# Patient Record
Sex: Male | Born: 1945
Health system: Southern US, Community
[De-identification: ages and names within clinical notes are randomized; demographics above are authoritative.]

## PROBLEM LIST (undated history)

## (undated) DIAGNOSIS — J309 Allergic rhinitis, unspecified: Secondary | ICD-10-CM

## (undated) DIAGNOSIS — Z860100 Personal history of colon polyps, unspecified: Secondary | ICD-10-CM

## (undated) DIAGNOSIS — B360 Pityriasis versicolor: Secondary | ICD-10-CM

## (undated) DIAGNOSIS — Z9849 Cataract extraction status, unspecified eye: Secondary | ICD-10-CM

## (undated) DIAGNOSIS — C61 Malignant neoplasm of prostate: Secondary | ICD-10-CM

## (undated) DIAGNOSIS — Z974 Presence of external hearing-aid: Secondary | ICD-10-CM

## (undated) DIAGNOSIS — Z9989 Dependence on other enabling machines and devices: Secondary | ICD-10-CM

## (undated) DIAGNOSIS — E785 Hyperlipidemia, unspecified: Secondary | ICD-10-CM

## (undated) DIAGNOSIS — Z7901 Long term (current) use of anticoagulants: Secondary | ICD-10-CM

## (undated) DIAGNOSIS — E119 Type 2 diabetes mellitus without complications: Secondary | ICD-10-CM

## (undated) DIAGNOSIS — N401 Enlarged prostate with lower urinary tract symptoms: Secondary | ICD-10-CM

## (undated) DIAGNOSIS — J302 Other seasonal allergic rhinitis: Secondary | ICD-10-CM

## (undated) DIAGNOSIS — I4819 Other persistent atrial fibrillation: Secondary | ICD-10-CM

## (undated) DIAGNOSIS — Z8739 Personal history of other diseases of the musculoskeletal system and connective tissue: Secondary | ICD-10-CM

## (undated) DIAGNOSIS — R0600 Dyspnea, unspecified: Secondary | ICD-10-CM

## (undated) DIAGNOSIS — G4733 Obstructive sleep apnea (adult) (pediatric): Secondary | ICD-10-CM

## (undated) DIAGNOSIS — I4891 Unspecified atrial fibrillation: Secondary | ICD-10-CM

## (undated) DIAGNOSIS — I429 Cardiomyopathy, unspecified: Secondary | ICD-10-CM

## (undated) DIAGNOSIS — J439 Emphysema, unspecified: Secondary | ICD-10-CM

## (undated) DIAGNOSIS — Z8619 Personal history of other infectious and parasitic diseases: Secondary | ICD-10-CM

## (undated) DIAGNOSIS — I10 Essential (primary) hypertension: Secondary | ICD-10-CM

## (undated) DIAGNOSIS — I499 Cardiac arrhythmia, unspecified: Secondary | ICD-10-CM

## (undated) DIAGNOSIS — I5042 Chronic combined systolic (congestive) and diastolic (congestive) heart failure: Secondary | ICD-10-CM

## (undated) DIAGNOSIS — J84115 Respiratory bronchiolitis interstitial lung disease: Secondary | ICD-10-CM

## (undated) DIAGNOSIS — K5730 Diverticulosis of large intestine without perforation or abscess without bleeding: Secondary | ICD-10-CM

## (undated) DIAGNOSIS — I519 Heart disease, unspecified: Secondary | ICD-10-CM

## (undated) DIAGNOSIS — I43 Cardiomyopathy in diseases classified elsewhere: Secondary | ICD-10-CM

## (undated) HISTORY — DX: Malignant neoplasm of prostate: C61

## (undated) HISTORY — PX: COLONOSCOPY: SHX174

## (undated) HISTORY — DX: Pityriasis versicolor: B36.0

## (undated) HISTORY — PX: TRANSTHORACIC ECHOCARDIOGRAM: SHX275

## (undated) HISTORY — PX: TONSILLECTOMY: SUR1361

## (undated) HISTORY — PX: CATARACT EXTRACTION W/ INTRAOCULAR LENS  IMPLANT, BILATERAL: SHX1307

## (undated) HISTORY — DX: Unspecified atrial fibrillation: I48.91

## (undated) HISTORY — DX: Essential (primary) hypertension: I10

## (undated) HISTORY — DX: Cataract extraction status, unspecified eye: Z98.49

## (undated) HISTORY — PX: LEFT ATRIAL APPENDAGE OCCLUSION: SHX173A

## (undated) HISTORY — DX: Hyperlipidemia, unspecified: E78.5

---
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
## 1965-04-09 HISTORY — PX: APPENDECTOMY: SHX54

## 2003-04-10 HISTORY — PX: BACK SURGERY: SHX140

## 2003-12-23 ENCOUNTER — Ambulatory Visit (HOSPITAL_COMMUNITY): Admission: RE | Admit: 2003-12-23 | Discharge: 2003-12-24 | Payer: Self-pay | Admitting: Neurosurgery

## 2003-12-23 HISTORY — PX: LAMINECTOMY AND MICRODISCECTOMY LUMBAR SPINE: SHX1913

## 2004-01-14 ENCOUNTER — Emergency Department (HOSPITAL_COMMUNITY): Admission: EM | Admit: 2004-01-14 | Discharge: 2004-01-14 | Payer: Self-pay | Admitting: Emergency Medicine

## 2004-02-10 ENCOUNTER — Encounter: Admission: RE | Admit: 2004-02-10 | Discharge: 2004-03-20 | Payer: Self-pay | Admitting: Neurosurgery

## 2004-07-14 ENCOUNTER — Ambulatory Visit: Payer: Self-pay | Admitting: Pulmonary Disease

## 2004-08-03 ENCOUNTER — Encounter: Payer: Self-pay | Admitting: Pulmonary Disease

## 2004-08-03 ENCOUNTER — Ambulatory Visit (HOSPITAL_BASED_OUTPATIENT_CLINIC_OR_DEPARTMENT_OTHER): Admission: RE | Admit: 2004-08-03 | Discharge: 2004-08-03 | Payer: Self-pay | Admitting: Pulmonary Disease

## 2004-08-11 ENCOUNTER — Ambulatory Visit: Payer: Self-pay | Admitting: Pulmonary Disease

## 2004-08-25 ENCOUNTER — Ambulatory Visit: Payer: Self-pay | Admitting: Pulmonary Disease

## 2004-09-22 ENCOUNTER — Ambulatory Visit: Payer: Self-pay | Admitting: Pulmonary Disease

## 2005-10-15 ENCOUNTER — Ambulatory Visit: Payer: Self-pay | Admitting: Gastroenterology

## 2005-10-22 ENCOUNTER — Ambulatory Visit: Payer: Self-pay | Admitting: Pulmonary Disease

## 2005-10-26 ENCOUNTER — Ambulatory Visit: Payer: Self-pay | Admitting: Gastroenterology

## 2005-10-26 ENCOUNTER — Encounter: Payer: Self-pay | Admitting: Gastroenterology

## 2007-05-20 ENCOUNTER — Encounter: Admission: RE | Admit: 2007-05-20 | Discharge: 2007-05-20 | Payer: Self-pay | Admitting: Family Medicine

## 2008-05-10 ENCOUNTER — Encounter: Payer: Self-pay | Admitting: Family Medicine

## 2008-07-01 DIAGNOSIS — J309 Allergic rhinitis, unspecified: Secondary | ICD-10-CM | POA: Insufficient documentation

## 2008-07-01 DIAGNOSIS — I10 Essential (primary) hypertension: Secondary | ICD-10-CM | POA: Insufficient documentation

## 2008-07-01 DIAGNOSIS — E785 Hyperlipidemia, unspecified: Secondary | ICD-10-CM | POA: Insufficient documentation

## 2008-07-02 ENCOUNTER — Ambulatory Visit: Payer: Self-pay | Admitting: Pulmonary Disease

## 2008-07-02 DIAGNOSIS — G4733 Obstructive sleep apnea (adult) (pediatric): Secondary | ICD-10-CM | POA: Insufficient documentation

## 2008-10-20 ENCOUNTER — Ambulatory Visit: Payer: Self-pay | Admitting: Family Medicine

## 2009-06-23 ENCOUNTER — Telehealth: Payer: Self-pay | Admitting: Family Medicine

## 2009-07-01 ENCOUNTER — Ambulatory Visit: Payer: Self-pay | Admitting: Pulmonary Disease

## 2009-07-19 ENCOUNTER — Telehealth: Payer: Self-pay | Admitting: Family Medicine

## 2009-07-19 ENCOUNTER — Ambulatory Visit: Payer: Self-pay | Admitting: Family Medicine

## 2009-07-19 DIAGNOSIS — B360 Pityriasis versicolor: Secondary | ICD-10-CM | POA: Insufficient documentation

## 2009-07-19 DIAGNOSIS — Z87891 Personal history of nicotine dependence: Secondary | ICD-10-CM | POA: Insufficient documentation

## 2009-07-20 LAB — CONVERTED CEMR LAB
Alkaline Phosphatase: 67 units/L (ref 39–117)
CO2: 32 meq/L (ref 19–32)
Cholesterol: 138 mg/dL (ref 0–200)
Creatinine, Ser: 1 mg/dL (ref 0.4–1.5)
Direct LDL: 49.9 mg/dL
Glucose, Bld: 132 mg/dL — ABNORMAL HIGH (ref 70–99)
Total CHOL/HDL Ratio: 3
Total Protein: 6.6 g/dL (ref 6.0–8.3)
Triglycerides: 366 mg/dL — ABNORMAL HIGH (ref 0.0–149.0)

## 2009-08-25 ENCOUNTER — Telehealth: Payer: Self-pay | Admitting: Pulmonary Disease

## 2009-08-26 ENCOUNTER — Encounter: Payer: Self-pay | Admitting: Pulmonary Disease

## 2009-10-07 ENCOUNTER — Ambulatory Visit: Payer: Self-pay | Admitting: Family Medicine

## 2009-10-07 DIAGNOSIS — M109 Gout, unspecified: Secondary | ICD-10-CM | POA: Insufficient documentation

## 2009-10-21 ENCOUNTER — Ambulatory Visit: Payer: Self-pay | Admitting: Family Medicine

## 2010-03-07 ENCOUNTER — Ambulatory Visit: Payer: Self-pay | Admitting: Family Medicine

## 2010-04-13 ENCOUNTER — Telehealth: Payer: Self-pay | Admitting: Family Medicine

## 2010-04-14 ENCOUNTER — Telehealth: Payer: Self-pay | Admitting: Family Medicine

## 2010-04-17 ENCOUNTER — Telehealth: Payer: Self-pay | Admitting: Family Medicine

## 2010-05-08 ENCOUNTER — Ambulatory Visit
Admission: RE | Admit: 2010-05-08 | Discharge: 2010-05-08 | Payer: Self-pay | Source: Home / Self Care | Attending: Family Medicine | Admitting: Family Medicine

## 2010-05-10 NOTE — Assessment & Plan Note (Signed)
Summary: fu on meds/pt will come in fasting/njr/pt rsc/cjr   Vital Signs:  Patient profile:   65 year old male Weight:      223 pounds Temp:     98.8 degrees F oral BP sitting:   142 / 90  (left arm) Cuff size:   large  Vitals Entered By: Sid Falcon LPN (July 19, 2009 8:49 AM)  Serial Vital Signs/Assessments:  Time      Position  BP       Pulse  Resp  Temp     By                     045/40                         Evelena Peat MD  CC: follow-up on meds, refills needed, pt fasting, Lipid Management, Hypertension Management   History of Present Illness: Patient here for followup multiple items as follows.  Hyperlipidemia. No history of CAD. Compliant with Lipitor. No side effects.  Hypertension treated with enalapril hydrochlorothiazide. No recent dizziness, palpitations or other side effects.  Ongoing nicotine use. Has scaled back. No chronic cough, progressive dyspnea, hemoptysis, or any weight changes.  Obstructive sleep apnea with recent assessment by pulmonologist and titration of CPAP.  History slightly pruritic rash mostly involving trunk previously treated with ketoconazole with good results. Would like to consider retreatment now with recent recurrence  Hypertension History:      He denies headache, chest pain, palpitations, dyspnea with exertion, orthopnea, PND, peripheral edema, visual symptoms, neurologic problems, syncope, and side effects from treatment.  He notes no problems with any antihypertensive medication side effects.        Positive major cardiovascular risk factors include male age 9 years old or older, hyperlipidemia, hypertension, and current tobacco user.  Negative major cardiovascular risk factors include no history of diabetes and negative family history for ischemic heart disease.        Further assessment for target organ damage reveals no history of ASHD, stroke/TIA, or peripheral vascular disease.    Lipid Management History:      Positive  NCEP/ATP III risk factors include male age 13 years old or older, current tobacco user, and hypertension.  Negative NCEP/ATP III risk factors include non-diabetic, no family history for ischemic heart disease, no ASHD (atherosclerotic heart disease), no prior stroke/TIA, no peripheral vascular disease, and no history of aortic aneurysm.      Allergies (verified): No Known Drug Allergies  Past History:  Past Surgical History: Last updated: 10/20/2008 s/p tonsillectomy. s/p spine surgery 2007  Past Medical History: Current Problems:  HYPERTENSION (ICD-401.9) HYPERLIPIDEMIA (ICD-272.4) ALLERGIC RHINITIS (ICD-477.9) RECURRENT TINEA VERSICOLOR OSA   PMH-FH-SH reviewed for relevance  Review of Systems  The patient denies anorexia, fever, weight loss, chest pain, syncope, dyspnea on exertion, peripheral edema, prolonged cough, headaches, hemoptysis, abdominal pain, melena, hematochezia, and severe indigestion/heartburn.    Physical Exam  General:  Well-developed,well-nourished,in no acute distress; alert,appropriate and cooperative throughout examination Head:  Normocephalic and atraumatic without obvious abnormalities. No apparent alopecia or balding. Eyes:  pupils equal, pupils round, and pupils reactive to light.   Ears:  External ear exam shows no significant lesions or deformities.  Otoscopic examination reveals clear canals, tympanic membranes are intact bilaterally without bulging, retraction, inflammation or discharge. Hearing is grossly normal bilaterally. Mouth:  Oral mucosa and oropharynx without lesions or exudates.  Teeth in good repair. Neck:  No  deformities, masses, or tenderness noted. Lungs:  Normal respiratory effort, chest expands symmetrically. Lungs are clear to auscultation, no crackles or wheezes. Heart:  normal rate, regular rhythm, and no murmur.   Extremities:  no edema.   Impression & Recommendations:  Problem # 1:  HYPERTENSION (ICD-401.9)  His  updated medication list for this problem includes:    Enalapril-hydrochlorothiazide 10-25 Mg Tabs (Enalapril-hydrochlorothiazide) .Marland Kitchen... Take 1 tablet by mouth once a day  Orders: Venipuncture (62130) TLB-BMP (Basic Metabolic Panel-BMET) (80048-METABOL)  Problem # 2:  HYPERLIPIDEMIA (ICD-272.4)  His updated medication list for this problem includes:    Lipitor 10 Mg Tabs (Atorvastatin calcium) .Marland Kitchen... Take 1 tablet by mouth once a day  Orders: Venipuncture (86578) TLB-Lipid Panel (80061-LIPID) TLB-Hepatic/Liver Function Pnl (80076-HEPATIC)  Problem # 3:  TINEA VERSICOLOR (ICD-111.0) Assessment: New  His updated medication list for this problem includes:    Ketoconazole 200 Mg Tabs (Ketoconazole) .Marland Kitchen... 2 tablets by mouth times one dose as needed tinea versicolor  Orders: Venipuncture (46962)  Problem # 4:  PERS HX TOBACCO USE PRESENTING HAZARDS HEALTH (ICD-V15.82) discussed cessation but pt motivation is low.  Complete Medication List: 1)  Lipitor 10 Mg Tabs (Atorvastatin calcium) .... Take 1 tablet by mouth once a day 2)  Enalapril-hydrochlorothiazide 10-25 Mg Tabs (Enalapril-hydrochlorothiazide) .... Take 1 tablet by mouth once a day 3)  Allegra 180 Mg Tabs (Fexofenadine hcl) .... Take 1 tablet by mouth once a day 4)  Aspirin Low Dose 81 Mg Tabs (Aspirin) .... Take 1 tablet by mouth once a day 5)  Flonase 50 Mcg/act Susp (Fluticasone propionate) .... Use as needed 6)  Fish Oil  .... Take by mouth daily 7)  Ketoconazole 200 Mg Tabs (Ketoconazole) .... 2 tablets by mouth times one dose as needed tinea versicolor  Hypertension Assessment/Plan:      The patient's hypertensive risk group is category B: At least one risk factor (excluding diabetes) with no target organ damage.  Today's blood pressure is 142/90.    Lipid Assessment/Plan:      Based on NCEP/ATP III, the patient's risk factor category is "2 or more risk factors and a calculated 10 year CAD risk of > 20%".  The  patient's lipid goals are as follows: Total cholesterol goal is 200; LDL cholesterol goal is 130; HDL cholesterol goal is 40; Triglyceride goal is 150.    Patient Instructions: 1)  Consider a complete physical examination at some point later this year Prescriptions: KETOCONAZOLE 200 MG TABS (KETOCONAZOLE) 2 tablets by mouth times one dose as needed tinea versicolor  #8 x 0   Entered and Authorized by:   Evelena Peat MD   Signed by:   Evelena Peat MD on 07/19/2009   Method used:   [facility]  [address] [phone][facility]       [address] [place], [place]  [address]       Ph: [phone] or [phone]       Fax: [phone]   RxID:   [id number]   Preventive Care Screening  Colonoscopy:    Date:  10/07/2005    Results:  normal

## 2010-05-10 NOTE — Assessment & Plan Note (Signed)
Summary: chills, mylagias/dm   Vital Signs:  Patient profile:   65 year old male Weight:      216 pounds Temp:     98.1 degrees F oral BP sitting:   140 / 100  (left arm) Cuff size:   large  Vitals Entered By: Sid Falcon LPN (March 07, 2010 9:14 AM)  History of Present Illness: Patient seen with 8 day history of bronchial symptoms. Cough productive of brown to green sputum. No dyspnea. No fevers. Diffuse myalgias. Some nasal congestion. Long-term smoker. No hemoptysis. No relief with over-the-counter medications.  Allergies (verified): No Known Drug Allergies  Past History:  Past Medical History: Last updated: 10/07/2009 Current Problems:  HYPERTENSION (ICD-401.9) HYPERLIPIDEMIA (ICD-272.4) ALLERGIC RHINITIS (ICD-477.9) RECURRENT TINEA VERSICOLOR OSA GOUT    Physical Exam  General:  Well-developed,well-nourished,in no acute distress; alert,appropriate and cooperative throughout examination Ears:  External ear exam shows no significant lesions or deformities.  Otoscopic examination reveals clear canals, tympanic membranes are intact bilaterally without bulging, retraction, inflammation or discharge. Hearing is grossly normal bilaterally. Mouth:  Oral mucosa and oropharynx without lesions or exudates.  Teeth in good repair. Neck:  No deformities, masses, or tenderness noted. Lungs:  Normal respiratory effort, chest expands symmetrically. Lungs are clear to auscultation, no crackles or wheezes. Heart:  normal rate and regular rhythm.     Impression & Recommendations:  Problem # 1:  ACUTE BRONCHITIS (ICD-466.0)  His updated medication list for this problem includes:    Azithromycin 250 Mg Tabs (Azithromycin) .Marland Kitchen... 2 by mouth today then one by mouth once daily for 4 days  Complete Medication List: 1)  Lipitor 10 Mg Tabs (Atorvastatin calcium) .... Take 1 tablet by mouth once a day 2)  Enalapril-hydrochlorothiazide 10-25 Mg Tabs (Enalapril-hydrochlorothiazide) ....  Take 1 tablet by mouth once a day 3)  Allegra 180 Mg Tabs (Fexofenadine hcl) .... Take 1 tablet by mouth once a day 4)  Aspirin Low Dose 81 Mg Tabs (Aspirin) .... Take 1 tablet by mouth once a day 5)  Flonase 50 Mcg/act Susp (Fluticasone propionate) .... Use as needed 6)  Fish Oil  .... Take by mouth daily 7)  Ketoconazole 200 Mg Tabs (Ketoconazole) .... 2 tablets by mouth times one dose as needed tinea versicolor 8)  Indomethacin 50 Mg Caps (Indomethacin) .... One by mouth q 8 hours as needed gout pain 9)  Azithromycin 250 Mg Tabs (Azithromycin) .... 2 by mouth today then one by mouth once daily for 4 days  Patient Instructions: 1)  Acute Bronchitis symptoms for less then 10 days are not  helped by antibiotics. Take over the counter cough medications. Call if no improvement in 5-7 days, sooner if increasing cough, fever, or new symptoms ( shortness of breath, chest pain) .  Prescriptions: AZITHROMYCIN 250 MG TABS (AZITHROMYCIN) 2 by mouth today then one by mouth once daily for 4 days  #6 x 0   Entered and Authorized by:   Evelena Peat MD   Signed by:   Evelena Peat MD on 03/07/2010   Method used:   [facility]  [address] [phone][facility]ail)       [address] [place], [place]  [address]       Ph: [phone] or [phone]       Fax: [phone]   RxID:   [id number]    Orders Added: 1)  Est. Patient Level III [id number]

## 2010-05-10 NOTE — Progress Notes (Signed)
Summary: results of cpap download  Phone Note Call from Patient Call back at [phone]   Caller: Patient Call For: Ronald [name] Summary of Call: pt calling to talk to nurse about sleep results Initial call taken by: Rickard Patience,  Aug 25, 2009 9:51 AM  Follow-up for Phone Call        Pt says he did a 2 week CPAP download through [facility] and has not received the results. Please advise if this information has been received.Michel Bickers CMA  Aug 25, 2009 10:23 AM  Additional Follow-up for Phone Call Additional follow up Details #1::        would be happy to review when results available. Additional Follow-up by: Barbaraann Share MD,  Aug 25, 2009 4:18 PM    Additional Follow-up for Phone Call Additional follow up Details #2::    Aundra Millet have you seen these results? Carron Curie CMA  Aug 25, 2009 4:32 PM   received results and put in [facility] very important look at folder.  Arman Filter LPN  Aug 25, 2009 4:37 PM   Additional Follow-up for Phone Call Additional follow up Details #3:: Details for Additional Follow-up Action Taken: reviewed. Additional Follow-up by: Barbaraann Share MD,  Aug 26, 2009 10:42 AM

## 2010-05-10 NOTE — Assessment & Plan Note (Signed)
Summary: gout/dm   Vital Signs:  Patient profile:   65 year old male Weight:      213 pounds Temp:     98.7 degrees F oral BP sitting:   130 / 90  (left arm) Cuff size:   large CC: Gout in right foot since monday, gout painsarted  in left foot last thursday, swelling & redness Cyndia Skeeters   History of Present Illness: Patient seen with right foot pain.  Past history of gout. He has had about 3 or 4 flareups this year and probably 5 episodes past 2 years. Took a friend's indomethacin and this seemed to help. He does take hydrochlorothiazide.  Previous gout flareups involving the upper extremity. No recent foot injury. He is aware of foods that may precipitate.  Moderate ETOH use and has scaled back some.  Current Medications (verified): 1)  Lipitor 10 Mg Tabs (Atorvastatin Calcium) .... Take 1 Tablet By Mouth Once A Day 2)  Enalapril-Hydrochlorothiazide 10-25 Mg Tabs (Enalapril-Hydrochlorothiazide) .... Take 1 Tablet By Mouth Once A Day 3)  Allegra 180 Mg Tabs (Fexofenadine Hcl) .... Take 1 Tablet By Mouth Once A Day 4)  Aspirin Low Dose 81 Mg Tabs (Aspirin) .... Take 1 Tablet By Mouth Once A Day 5)  Flonase 50 Mcg/act Susp (Fluticasone Propionate) .... Use As Needed 6)  Fish Oil .... Take By Mouth Daily 7)  Ketoconazole 200 Mg Tabs (Ketoconazole) .... 2 Tablets By Mouth Times One Dose As Needed Tinea Versicolor  Allergies (verified): No Known Drug Allergies  Past History:  Social History: Last updated: 10/20/2008 pt works in Air[employer]. pt is married with children. Patient is a current smoker.  started at age 75.  1 ppd.  Current Smoker Alcohol use-yes  Past Medical History: Current Problems:  HYPERTENSION (ICD-401.9) HYPERLIPIDEMIA (ICD-272.4) ALLERGIC RHINITIS (ICD-477.9) RECURRENT TINEA VERSICOLOR OSA GOUT   PMH reviewed for relevance, SH/Risk Factors reviewed for relevance  Review of Systems  The patient denies fever.    Physical Exam  General:   Well-developed,well-nourished,in no acute distress; alert,appropriate and cooperative throughout examination Lungs:  Normal respiratory effort, chest expands symmetrically. Lungs are clear to auscultation, no crackles or wheezes. Heart:  normal rate and regular rhythm.   Extremities:  right foot reveals swelling ,redness, and tenderness MTP joint. No visible break in skin. Skin:  no rashes.     Impression & Recommendations:  Problem # 1:  GOUT, UNSPECIFIED (ICD-274.9) Discussed dietary factors and importance of adeq hydration.  Scale back ETOH.  Indocin.  Consider d/c HCTZ if freq episodes continue. His updated medication list for this problem includes:    Indomethacin 50 Mg Caps (Indomethacin) ..... One by mouth q 8 hours as needed gout pain  Complete Medication List: 1)  Lipitor 10 Mg Tabs (Atorvastatin calcium) .... Take 1 tablet by mouth once a day 2)  Enalapril-hydrochlorothiazide 10-25 Mg Tabs (Enalapril-hydrochlorothiazide) .... Take 1 tablet by mouth once a day 3)  Allegra 180 Mg Tabs (Fexofenadine hcl) .... Take 1 tablet by mouth once a day 4)  Aspirin Low Dose 81 Mg Tabs (Aspirin) .... Take 1 tablet by mouth once a day 5)  Flonase 50 Mcg/act Susp (Fluticasone propionate) .... Use as needed 6)  Fish Oil  .... Take by mouth daily 7)  Ketoconazole 200 Mg Tabs (Ketoconazole) .... 2 tablets by mouth times one dose as needed tinea versicolor 8)  Indomethacin 50 Mg Caps (Indomethacin) .... One by mouth q 8 hours as needed gout pain  Patient Instructions: 1)  Stay well hydrated 2)  Avoid excessive use of red meats with a goal of 2 times per week 3)  Alcohol in moderation Prescriptions: INDOMETHACIN 50 MG CAPS (INDOMETHACIN) one by mouth q 8 hours as needed gout pain  #60 x 1   Entered and Authorized by:   Evelena Peat MD   Signed by:   Evelena Peat MD on 10/07/2009   Method used:   [facility]        [facility]  [address] [phone][facility] ([facility]ail[facility]       [address]  [place], [place]  [address]       Ph: [phone] or [phone]       Fax: [phone]   RxID:   [id number]

## 2010-05-10 NOTE — Assessment & Plan Note (Signed)
Summary: rov for osa   Primary Provider/Referring Provider:  Burchette  CC:  Yearly OSA follow up.  states he is wearing cpap everynight for approx 7-8 hours each night.  denies problems with pressure.  Would like to discuss new mask.  Marland Kitchen  History of Present Illness: The pt comes in today for f/u of his osa.  He is wearing cpap compliantly, and reports no issues with presssure or mask fit.  He is asking if there is any new mask technology, and I have shown him one of the newer full face masks.  He feels that he is not sleeping as well as he has in the past, and we have not rechecked his pressure in 6-7 years (because he has been doing well).  Current Medications (verified): 1)  Lipitor 10 Mg Tabs (Atorvastatin Calcium) .... Take 1 Tablet By Mouth Once A Day 2)  Enalapril-Hydrochlorothiazide 10-25 Mg Tabs (Enalapril-Hydrochlorothiazide) .... Take 1 Tablet By Mouth Once A Day 3)  Allegra 180 Mg Tabs (Fexofenadine Hcl) .... Take 1 Tablet By Mouth Once A Day 4)  Aspirin Low Dose 81 Mg Tabs (Aspirin) .... Take 1 Tablet By Mouth Once A Day 5)  Flonase 50 Mcg/act Susp (Fluticasone Propionate) .... Use As Needed 6)  Fish Oil .... Take By Mouth Daily  Allergies (verified): No Known Drug Allergies  Review of Systems      See HPI  Vital Signs:  Patient profile:   65 year old male Height:      71 inches Weight:      223.38 pounds BMI:     31.27 O2 Sat:      97 % on Room air Temp:     97.9 degrees F oral Pulse rate:   89 / minute BP sitting:   132 / 86  (right arm) Cuff size:   large  Vitals Entered By: Gweneth Dimitri RN (July 01, 2009 8:58 AM)  O2 Flow:  Room air CC: Yearly OSA follow up.  states he is wearing cpap everynight for approx 7-8 hours each night.  denies problems with pressure.  Would like to discuss new mask.   Comments Medications reviewed with patient Daytime contact number verified with patient. Gweneth Dimitri RN  July 01, 2009 8:58 AM    Physical Exam  General:  ow  male in nad Nose:  no skin breakdown or pressure necrosis from cpap mask. Neurologic:  alert, not sleepy, moves all 4.   Impression & Recommendations:  Problem # 1:  OBSTRUCTIVE SLEEP APNEA (ICD-327.23) the pt is doing well with cpap, and is having no issues with the mask or pressure.  He is not sleeping quite as well, but this may have nothing to do with his osa.  We have not checked his pressure in many years, and it would be reasonable to do so given his symptoms.  Will call the pt once the results are available.  I have encouraged him to work aggressively on weight loss.  Time spent with pt today was .  Other Orders: Est. Patient Level III (04540) DME Referral (DME)  Patient Instructions: 1)  will recheck your pressure with an auto device at home for 2 weeks.  Will call you with the results. 2)  work on weight loss 3)  followup with me in 12mos   Immunization History:  Influenza Immunization History:    Influenza:  historical (01/08/2008)

## 2010-05-10 NOTE — Progress Notes (Signed)
Summary:  REFILLS for [facility], pt needs OV  Phone Note Refill Request Message from:  Fax from Pharmacy  Refills Requested: Medication #1:  FLONASE 50 MCG/ACT SUSP use as needed   Brand Name Necessary? No  Medication #2:  LIPITOR 10 MG TABS Take 1 tablet by mouth once a day  Medication #3:  ALLEGRA 180 MG TABS Take 1 tablet by mouth once a day   Brand Name Necessary? No  Medication #4:  ENALAPRIL-HYDROCHLOROTHIAZIDE 10-25 MG TABS Take 1 tablet by mouth once a day [facility] FAX---[phone]  Initial call taken by: Warnell Forester,  June 23, 2009 2:26 PM  Follow-up for Phone Call        [facility] , OV in July Dr Caryl Never wanted to see pt in 3 months.  LMTCB to consider follow up OV Follow-up by: Sid Falcon LPN,  June 23, 2009 4:44 PM  Additional Follow-up for Phone Call Additional follow up Details #1::        Filled Rx for 90 days per Dr Caryl Never, 0 RF.  Pt informed Dr B would like to see him in the next couple months.  Pt voiced his understanding. Additional Follow-up by: Sid Falcon LPN,  June 24, 2009 5:16 PM    Prescriptions: Aleda Grana 50 MCG/ACT SUSP (FLUTICASONE PROPIONATE) use as needed  #3 x 0   Entered by:   Sid Falcon LPN   Authorized by:   Evelena Peat MD   Signed by:   Sid Falcon LPN on 14/78/2956   Method used:   Electronically to        [facility]* (mail-order)             ,          Ph: [phone]       Fax: [phone]   RxID:   [id number] ALLEGRA 180 MG TABS (FEXOFENADINE HCL) Take 1 tablet by mouth once a day  #90 x 0   Entered by:   Sid Falcon LPN   Authorized by:   Evelena Peat MD   Signed by:   Sid Falcon LPN on 03/47/4259   Method used:   Electronically to        [facility]* (mail-order)             ,          Ph: [phone]       Fax: [phone]   RxID:   [id number] ENALAPRIL-HYDROCHLOROTHIAZIDE 10-25 MG TABS (ENALAPRIL-HYDROCHLOROTHIAZIDE) Take 1 tablet by mouth once a day  #90 x 0   Entered by:    Sid Falcon LPN   Authorized by:   Evelena Peat MD   Signed by:   Sid Falcon LPN on 32/20/2542   Method used:   Electronically to        [facility]* (mail-order)             ,          Ph: [phone]       Fax: [phone]   RxID:   [id number] LIPITOR 10 MG TABS (ATORVASTATIN CALCIUM) Take 1 tablet by mouth once a day  #90 x 0   Entered by:   Sid Falcon LPN   Authorized by:   Evelena Peat MD   Signed by:   Sid Falcon LPN on 50/12/3816   Method used:   Electronically to        [facility]* (mail-order)             ,  Ph: [phone]       Fax: [phone]   RxID:   [id number]

## 2010-05-10 NOTE — Miscellaneous (Signed)
Summary: optimal pressure 12cm.  Clinical Lists Changes  Orders: Added new Referral order of DME Referral (DME) - Signed auto shows great compliance, no major mask leaks, and optimal pressure of 12cm

## 2010-05-10 NOTE — Progress Notes (Signed)
Summary: Td status update from [facility] Note From Other Clinic   Caller: Nurse Call For: Latroya Ng Summary of Call: [facility]er nurse called back to report they have no record ot pt TD status, never given at their office. Initial call taken by: Sid Falcon LPN,  July 19, 2009 9:52 AM  Follow-up for Phone Call        noted. Follow-up by: Evelena Peat MD,  July 19, 2009 1:17 PM

## 2010-05-11 NOTE — Progress Notes (Signed)
  Phone Note Call from Patient   Caller: Patient Call For: Evelena Peat MD Summary of Call: Pt is asking for Nicotrol. [facility] [phone] Initial call taken by: [facility] CMA AAMA,  April 13, 2010 4:44 PM  Follow-up for Phone Call        this is OTC. Follow-up by: Evelena Peat MD,  April 14, 2010 2:42 PM  Additional Follow-up for Phone Call Additional follow up Details #1::        Notified pt. Additional Follow-up by: Lynann Beaver CMA AAMA,  April 14, 2010 2:50 PM

## 2010-05-11 NOTE — Progress Notes (Signed)
Summary: Nicotrol not OTC  Phone Note Call from Patient Call back at Work Phone [phone]   Caller: Patient---live call Summary of Call: pt went to [facility] pharmacy and they told him that Nicotrol is not otc. Please advise. Initial call taken by: Warnell Forester,  April 14, 2010 3:57 PM  Follow-up for Phone Call        Rx sent, pt informed  Follow-up by: Sid Falcon LPN,  April 14, 2010 4:32 PM    New/Updated Medications: NICOTROL 10 MG INHA (NICOTINE) use as directed 6-16 inhalations as needed daily, taper as directed Prescriptions: NICOTROL 10 MG INHA (NICOTINE) use as directed 6-16 inhalations as needed daily, taper as directed  #1box x 3   Entered by:   Sid Falcon LPN   Authorized by:   Evelena Peat MD   Signed by:   Sid Falcon LPN on 46/96/2952   Method used:   [facility]        [facility]  [address] [phone][facility]       [address] [place], [place]  [address]       Ph: [phone] or [phone]       Fax: [phone]   RxID:   [id number]

## 2010-05-11 NOTE — Progress Notes (Signed)
Summary: please call pharm  Phone Note Refill Request   Refills Requested: Medication #1:  NICOTROL 10 MG INHA use as directed 6-16 inhalations as needed daily pharm never received rx please call Gerome Apley [phone]  Initial call taken by: Heron Sabins,  April 17, 2010 2:11 PM  Follow-up for Phone Call        Rx called in Follow-up by: Sid Falcon LPN,  April 18, 2010 8:24 AM

## 2010-05-17 NOTE — Assessment & Plan Note (Signed)
Summary: ?rash or insect bites/cjr   Vital Signs:  Patient profile:   65 year old male Weight:      219 pounds Temp:     98.7 degrees F oral BP sitting:   124 / 80  (left arm) Cuff size:   large  Vitals Entered By: Sid Falcon LPN (May 08, 2010 2:30 PM)  History of Present Illness: Patient seen with pruritic rash off and on past couple months. Initial concern about possible bed bugs. He is not had any formal testing to see if this was confirmed. Has consulted a couple of pest control companies. Rash initially mostly legs now arms and trunk. These are pruritic- mostly at night. Benadryl helps. No change of soaps or detergents. Wife does not have any similar symptoms.  Allergies (verified): No Known Drug Allergies  Past History:  Past Medical History: Last updated: 10/07/2009 Current Problems:  HYPERTENSION (ICD-401.9) HYPERLIPIDEMIA (ICD-272.4) ALLERGIC RHINITIS (ICD-477.9) RECURRENT TINEA VERSICOLOR OSA GOUT    Review of Systems  The patient denies fever, headaches, abdominal pain, and enlarged lymph nodes.    Physical Exam  General:  Well-developed,well-nourished,in no acute distress; alert,appropriate and cooperative throughout examination Neck:  No deformities, masses, or tenderness noted. Lungs:  Normal respiratory effort, chest expands symmetrically. Lungs are clear to auscultation, no crackles or wheezes. Heart:  normal rate and regular rhythm.   Skin:  patient has some nonspecific mildly erythematous scaly circumferential rashes in patchy distribution trunk area. Couple of pinpoint erythematous lesions legs and forearms. No pustules. No vesicles   Impression & Recommendations:  Problem # 1:  SKIN RASH (ICD-782.1)  nonspecific rash. Areas on trunk appear more eczematous. Questionable follicular type rash extremities. Depo-Medrol 80 mg given. Continue antihistamine. Topical steroid for dry patches on trunk  His updated medication list for this problem  includes:    Triamcinolone Acetonide 0.1 % Crea (Triamcinolone acetonide) .Marland Kitchen... Apply to affected rash two times a day as needed  Orders: Depo- Medrol 80mg  (J1040) Admin of Therapeutic Inj  intramuscular or subcutaneous (16109)  Complete Medication List: 1)  Lipitor 10 Mg Tabs (Atorvastatin calcium) .... Take 1 tablet by mouth once a day 2)  Enalapril-hydrochlorothiazide 10-25 Mg Tabs (Enalapril-hydrochlorothiazide) .... Take 1 tablet by mouth once a day 3)  Allegra 180 Mg Tabs (Fexofenadine hcl) .... Take 1 tablet by mouth once a day 4)  Aspirin Low Dose 81 Mg Tabs (Aspirin) .... Take 1 tablet by mouth once a day 5)  Flonase 50 Mcg/act Susp (Fluticasone propionate) .... Use as needed 6)  Fish Oil  .... Take by mouth daily 7)  Ketoconazole 200 Mg Tabs (Ketoconazole) .... 2 tablets by mouth times one dose as needed tinea versicolor 8)  Indomethacin 50 Mg Caps (Indomethacin) .... One by mouth q 8 hours as needed gout pain 9)  Azithromycin 250 Mg Tabs (Azithromycin) .... 2 by mouth today then one by mouth once daily for 4 days 10)  Nicotrol 10 Mg Inha (Nicotine) .... Use as directed 6-16 inhalations as needed daily, taper as directed 11)  Triamcinolone Acetonide 0.1 % Crea (Triamcinolone acetonide) .... Apply to affected rash two times a day as needed  Patient Instructions: 1)  apply topical steroid cream to involved areas up to twice daily. 2)  Consider use of moisturizing cream such as Eucerin 3)  Continue use of over-the-counter antihistamine as needed for itching Prescriptions: TRIAMCINOLONE ACETONIDE 0.1 % CREA (TRIAMCINOLONE ACETONIDE) apply to affected rash two times a day as needed  #80 gm x 1  Entered and Authorized by:   Evelena Peat MD   Signed by:   Evelena Peat MD on 05/08/2010   Method used:   Electronically to        [phone])       [address] [place], [place]  [address]       Ph: [phone] or [phone]       Fax:  [phone]   RxID:   [id number]    Medication Administration  Injection # 1:    Medication: Depo- Medrol 80mg     Diagnosis: SKIN RASH (ICD-782.1)    Route: IM    Site: RUOQ gluteus    Exp Date: 10/14/2012    Lot #: [id number]    Mfr: Pharmacia    Patient tolerated injection without complications    Given by: Sid Falcon LPN (May 08, 2010 3:24 PM)  Orders Added: 1)  Depo- Medrol 80mg  [J1040] 2)  Admin of Therapeutic Inj  intramuscular or subcutaneous [96372] 3)  Est. Patient Level III [74259]

## 2010-06-19 ENCOUNTER — Encounter: Payer: Self-pay | Admitting: *Deleted

## 2010-06-29 ENCOUNTER — Encounter: Payer: Self-pay | Admitting: Family Medicine

## 2010-06-29 ENCOUNTER — Ambulatory Visit (INDEPENDENT_AMBULATORY_CARE_PROVIDER_SITE_OTHER): Payer: Self-pay | Admitting: Family Medicine

## 2010-06-29 VITALS — BP 150/90 | Temp 98.2°F | Ht 71.0 in | Wt 215.0 lb

## 2010-06-29 DIAGNOSIS — R21 Rash and other nonspecific skin eruption: Secondary | ICD-10-CM

## 2010-06-29 NOTE — Patient Instructions (Signed)
Get back on triamcinolone cream.

## 2010-06-29 NOTE — Progress Notes (Signed)
  Subjective:    Patient ID: Ronald [name], male    DOB: Mar 06, 1946, 65 y.o.   MRN: [id number]  HPI  Followup rash. Refer to prior note. Patient had nonspecific rash on extremities and trunk. Received Depo-Medrol and topical steroid and did seem to be helping. He has not used steroid cream recently. Especially noticed some increase in rash over the past several days , exacerbated by hot tub. No pustules.  Overall has had  about 3 months of skin rash. No change of medications.   No change in soaps or detergents. Rash is pruritic. Improved somewhat with Benadryl and topical moisturizers.   Review of Systems  Constitutional: Negative for fever, chills, appetite change and unexpected weight change.  Respiratory: Negative for cough and shortness of breath.   Cardiovascular: Negative for chest pain and leg swelling.  Gastrointestinal: Negative for abdominal pain.  Neurological: Negative for headaches.  Hematological: Negative for adenopathy. Does not bruise/bleed easily.       Objective:   Physical Exam  the patient is alert and in no distress Neck no mass Chest good auscultation Heart regular rhythm and rate Extremities no edema Skin exam he has nummular-type rash which is slightly raised erythematous blanches and minimally scaly. Mostly involvement of legs with some involvement of the forearms and trunk as well       Assessment & Plan:   nonspecific rash. Question nummular eczema. Doubt drug reaction. Discussed options including skin biopsy or dermatology referral and we elected the latter. Get back on topical steroid cream.

## 2010-06-30 ENCOUNTER — Ambulatory Visit (INDEPENDENT_AMBULATORY_CARE_PROVIDER_SITE_OTHER): Payer: Federal, State, Local not specified - PPO | Admitting: Pulmonary Disease

## 2010-06-30 ENCOUNTER — Encounter: Payer: Self-pay | Admitting: Pulmonary Disease

## 2010-06-30 VITALS — BP 130/84 | HR 102 | Temp 98.1°F | Ht 71.0 in | Wt 219.4 lb

## 2010-06-30 DIAGNOSIS — G4733 Obstructive sleep apnea (adult) (pediatric): Secondary | ICD-10-CM

## 2010-06-30 NOTE — Progress Notes (Signed)
  Subjective:    Patient ID: Ronald [name], male    DOB: 02-19-1946, 65 y.o.   MRN: [id number]  HPI The pt comes in today for f/u of his known osa.  He is wearing cpap compliantly, and has no issues with mask fit or pressure.  He feels he is sleeping well, and has acceptable daytime alertness.  He has not lost weight successfully.    Review of Systems  Constitutional: Negative for fever, appetite change and unexpected weight change.  HENT: Positive for congestion, rhinorrhea and postnasal drip. Negative for ear pain, sore throat, sneezing, trouble swallowing and dental problem.   Eyes: Negative for redness.  Respiratory: Negative for cough, shortness of breath and wheezing.   Cardiovascular: Negative for chest pain, palpitations and leg swelling.  Gastrointestinal: Negative for nausea, abdominal pain and diarrhea.  Genitourinary: Negative for dysuria.  Musculoskeletal: Negative for joint swelling.  Skin: Positive for rash.  Neurological: Negative for syncope and headaches.  Hematological: Does not bruise/bleed easily.  Psychiatric/Behavioral: Negative for dysphoric mood. The patient is not nervous/anxious.        Objective:   Physical Exam  Constitutional: He is oriented to person, place, and time. He appears well-developed.       Overweight   HENT:       No skin breakdown or pressure necrosis from cpap mask  Cardiovascular: Normal rate, regular rhythm and normal heart sounds.   Musculoskeletal: He exhibits no edema.  Neurological: He is alert and oriented to person, place, and time.       Does not appear sleepy  Skin:       No cyanosis           Assessment & Plan:

## 2010-06-30 NOTE — Patient Instructions (Signed)
Continue with cpap. Work on weight loss followup with me in one year.  

## 2010-06-30 NOTE — Assessment & Plan Note (Signed)
The pt is doing well with cpap, and has no issues with tolerance or symptoms. I have encouraged him to work on weight loss, and to keep up with supplies.

## 2010-08-25 NOTE — Procedures (Signed)
NAME:  Ronald [name], Ronald [name] NO.:  [id number]   MEDICAL RECORD NO.:  [id number]          PATIENT TYPE:  OUT   LOCATION:  SLEEP CENTER                 FACILITY:  [facility]   PHYSICIAN:  Marcelyn Bruins, M.D. [facility] DATE OF BIRTH:  1945-10-17   DATE OF STUDY:  08/03/2004                              NOCTURNAL POLYSOMNOGRAM   REFERRING PHYSICIAN:  Dr. Marcelyn Bruins.   INDICATION FOR THE STUDY:  Hypersomnia with sleep apnea. Epworth score: 4.   SLEEP ARCHITECTURE:  The patient had a total sleep time of 345 minutes with  adequate REM but very diminished slow wave sleep. Sleep onset latency was  normal and REM onset was somewhat prolonged.   IMPRESSION:  1.  Severe obstructive sleep apnea/hypopnea syndrome with a respiratory      disturbance index of 77 events per hour and oxygen desaturation as low      as 69%. The events were not positional but they were clearly worse      during REM.  2.  Moderate to severe snoring noted throughout  3.  Occasional premature ventricular contraction and premature atrial      contraction, as well as cardiac accelerations and decelerations      associated with obstructive events.      KC/MEDQ  D:  08/11/2004 11:39:54  T:  08/11/2004 13:40:12  Job:  [id number]

## 2010-08-25 NOTE — Op Note (Signed)
NAME:  Ronald [name], Ronald [name]                         ACCOUNT NO.:  [id number]   MEDICAL RECORD NO.:  [id number]                   PATIENT TYPE:  OIB   LOCATION:  3172                                 FACILITY:  [facility]   PHYSICIAN:  Clydene Fake, M.D.               DATE OF BIRTH:  1945/11/06   DATE OF PROCEDURE:  12/23/2003  DATE OF DISCHARGE:                                 OPERATIVE REPORT   DIAGNOSIS:  Left L5-S1 herniated nucleus pulposus.   POSTOPERATIVE DIAGNOSIS:  Left L5-S1 herniated nucleus pulposus.   PROCEDURE:  Left L5-S1 semihemilaminectomy and discectomy, microdissection  with a microscope.   SURGEON:  Clydene Fake, M.D.   ASSISTANT:  Cristi Loron, M.D.   ANESTHESIA:  General endotracheal tube anesthesia.   ESTIMATED BLOOD LOSS:  Minimal.   BLOOD REPLACED:  None.   COMPLICATIONS:  None.   INDICATIONS FOR PROCEDURE:  The patient is a 65 year old gentleman who has  had severe back and left leg pain, numbness and weakness going down to his  foot with MRI showing extremely large disc herniation on the left side which  revealed L5-S1 compressing the S1 root.  The patient brought in for  decompression.   DESCRIPTION OF PROCEDURE:  The patient was brought in to the operating room.  General anesthesia induced.  The patient was placed in the prone position on  the Wilson frame with all pressure points padded.  The patient weakness  prepped and draped in normal sterile fashion.  Segment of injection was  injected with 10 mL of 1% lidocaine with epinephrine.  Needle was placed in  the interspace in the lower lumbar spine.  X-rays were obtained and showed  we were pointing just below the L5 spinous process but  directing toward S1-  S2 direction.  Incision was made, centered distally above where the needle  was placed and incision taken down to the fascia and hemostasis obtained  with Bovie cauterization.  The fascia was incised and subperiosteal  dissection was  done over the spinous process and lamina of L5 and S1 out to  the facet.  Retractor was placed and marker was placed in the interspace and  other x-rays obtained confirming our positioning at L5-S1.  Microscope was  brought in for microdissection at this point.  High speed drill was used to  start a semihemilaminectomy and medial facetectomy and this was completed  with Kerrison punch.  Ligamentum flavum was removed and foraminotomy was  done over the S1 nerve root.  Right around the pedicle we did not get all  the ligament and bone removed because the nerve was right there.  It  compressed into that and we could see some disc herniation in the axilla.  We took a nerve hook and went under the root laterally at the disc space and  we were able to get hold part of this free fragment and  bring up a tail.  With some slight traction over the nerve root and with some slight traction  on this disc herniation, and gently removed a  hugh fragment of disc from up  under the nerve root.  Disc was still under the axilla was now gone, the S1  root was loose and we were able to reflect the dura and nerve root medially  with root retractor a lot easier and explored the disc space.  We explored  under the nerve roots and medially and found more free fragments of disc  that we removed.  We incised the disc space with #15 blade and discectomy  was performed with pituitary rongeurs and curettes.  The curettes were  continued to search for more free fragments and we did find more a bit more  medially, both distally and proximal around the disc space.  When we were  finished, we had good decompression of the central canal and S1 nerve root.  We placed bilateral hook into the S1 root  and foramen.  It was very clear  and fiber was also decompressed.  Wound was irrigated with antibiotic  solution.  We had good hemostasis. Retractors were removed and the fascia  was closed with 0 Vicryl interrupted sutures and the  subcutaneous tissue was  closed with 2-0 and 3-0 Vicryl interrupted sutures and the skin closed with  Benzoin and Steri-Strips.  Dressing was placed.  The patient was placed back  to a supine position, awakened from anesthesia and sent to recovery room in  stable condition.      JRH/MEDQ  D:  12/23/2003  T:  12/24/2003  Job:  [id number]

## 2010-10-06 ENCOUNTER — Encounter: Payer: Self-pay | Admitting: Gastroenterology

## 2010-10-17 ENCOUNTER — Other Ambulatory Visit: Payer: Self-pay | Admitting: *Deleted

## 2010-10-17 MED ORDER — FLUTICASONE PROPIONATE 50 MCG/ACT NA SUSP: NASAL | Status: DC

## 2010-10-17 MED ORDER — ENALAPRIL-HYDROCHLOROTHIAZIDE 10-25 MG PO TABS: 1.0000 | ORAL_TABLET | Freq: Every day | ORAL | Status: DC

## 2010-10-17 MED ORDER — ATORVASTATIN CALCIUM 10 MG PO TABS: 10.0000 mg | ORAL_TABLET | Freq: Every day | ORAL | Status: DC

## 2010-12-25 ENCOUNTER — Telehealth: Payer: Self-pay | Admitting: Family Medicine

## 2010-12-25 MED ORDER — FLUTICASONE PROPIONATE 50 MCG/ACT NA SUSP: NASAL | Status: DC

## 2010-12-25 MED ORDER — TRIAMCINOLONE ACETONIDE 0.1 % EX CREA: TOPICAL_CREAM | CUTANEOUS | Status: DC

## 2010-12-25 MED ORDER — ATORVASTATIN CALCIUM 10 MG PO TABS: 10.0000 mg | ORAL_TABLET | Freq: Every day | ORAL | Status: DC

## 2010-12-25 MED ORDER — ENALAPRIL-HYDROCHLOROTHIAZIDE 10-25 MG PO TABS: 1.0000 | ORAL_TABLET | Freq: Every day | ORAL | Status: DC

## 2010-12-25 NOTE — Telephone Encounter (Signed)
Pt requesting a year refill on atorvastatin (LIPITOR) 10 MG tablet,enalapril-hydrochlorothiazide (VASERETIC) 10-25 MG per tablet,fluticasone (FLONASE) 50 MCG/ACT nasal spray, and and 1 month refill on  triamcinolone (KENALOG) 0.1 % cream.  [facility]

## 2011-02-17 ENCOUNTER — Other Ambulatory Visit: Payer: Self-pay | Admitting: Family Medicine

## 2011-04-13 ENCOUNTER — Telehealth: Payer: Self-pay | Admitting: Family Medicine

## 2011-04-13 MED ORDER — ATORVASTATIN CALCIUM 10 MG PO TABS: 10.0000 mg | ORAL_TABLET | Freq: Every day | ORAL | Status: DC

## 2011-04-13 MED ORDER — ENALAPRIL-HYDROCHLOROTHIAZIDE 10-25 MG PO TABS: 1.0000 | ORAL_TABLET | Freq: Every day | ORAL | Status: DC

## 2011-04-13 MED ORDER — FLUTICASONE PROPIONATE 50 MCG/ACT NA SUSP: 2.0000 | Freq: Every day | NASAL | Status: DC

## 2011-04-13 NOTE — Telephone Encounter (Signed)
Refill generic Lipitor, Enalapril and Flonase to [facility]. Pt will make an appt. Thanks.

## 2011-05-15 ENCOUNTER — Ambulatory Visit (INDEPENDENT_AMBULATORY_CARE_PROVIDER_SITE_OTHER): Payer: Federal, State, Local not specified - PPO | Admitting: Family Medicine

## 2011-05-15 ENCOUNTER — Encounter: Payer: Self-pay | Admitting: Family Medicine

## 2011-05-15 DIAGNOSIS — Z87891 Personal history of nicotine dependence: Secondary | ICD-10-CM

## 2011-05-15 DIAGNOSIS — M109 Gout, unspecified: Secondary | ICD-10-CM

## 2011-05-15 DIAGNOSIS — E785 Hyperlipidemia, unspecified: Secondary | ICD-10-CM

## 2011-05-15 DIAGNOSIS — Z299 Encounter for prophylactic measures, unspecified: Secondary | ICD-10-CM

## 2011-05-15 DIAGNOSIS — G4733 Obstructive sleep apnea (adult) (pediatric): Secondary | ICD-10-CM

## 2011-05-15 DIAGNOSIS — I10 Essential (primary) hypertension: Secondary | ICD-10-CM

## 2011-05-15 LAB — BASIC METABOLIC PANEL
Calcium: 9.1 mg/dL (ref 8.4–10.5)
Creatinine, Ser: 0.9 mg/dL (ref 0.4–1.5)
GFR: 92.09 mL/min (ref >60.00)
Glucose, Bld: 121 mg/dL — ABNORMAL HIGH (ref 70–99)
Potassium: 4 mEq/L (ref 3.5–5.1)
Sodium: 140 mEq/L (ref 135–145)

## 2011-05-15 LAB — LIPID PANEL
HDL: 43.8 mg/dL (ref >39.00)
LDL Cholesterol: 57 mg/dL (ref 0–99)

## 2011-05-15 LAB — HEPATIC FUNCTION PANEL
Albumin: 4.1 g/dL (ref 3.5–5.2)
Alkaline Phosphatase: 68 U/L (ref 39–117)
Bilirubin, Direct: 0.1 mg/dL (ref 0.0–0.3)

## 2011-05-15 MED ORDER — AMLODIPINE BESYLATE 5 MG PO TABS: 5.0000 mg | ORAL_TABLET | Freq: Every day | ORAL | Status: DC

## 2011-05-15 MED ORDER — TETANUS-DIPHTH-ACELL PERTUSSIS 5-2.5-18.5 LF-MCG/0.5 IM SUSP: 0.5000 mL | Freq: Once | INTRAMUSCULAR | Status: DC

## 2011-05-15 MED ORDER — PNEUMOCOCCAL VAC POLYVALENT 25 MCG/0.5ML IJ INJ: 0.5000 mL | INJECTION | Freq: Once | INTRAMUSCULAR | Status: DC

## 2011-05-15 NOTE — Progress Notes (Signed)
  Subjective:    Patient ID: Ronald [name], male    DOB: February 24, 1946, 66 y.o.   MRN: [id number]  HPI  Medical followup. Problems include ongoing nicotine use, hypertension, hyperlipidemia, gout, obstructive sleep apnea. Remains on CPAP. Has been treated for several years with enalapril HCTZ for blood pressure. Not monitoring. History of previous elevation here. No headaches or dizziness. No chest pains. Still smokes. No chronic cough. No significant dyspnea.  History of hyperlipidemia treated with Lipitor 10 mg daily. Needs lab work. No myalgias. No history of confirmed Pneumovax. Last tetanus unknown. 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 Needs tetanus booster  Past Medical History  Diagnosis Date  . Hypertension   . Hyperlipidemia   . ALLERGIC RHINITIS   . Sleep apnea   . Gout   . Tinea versicolor    Past Surgical History  Procedure Date  . Tonsillectomy   . Spine surgery 2007    reports that he quit smoking about 12 months ago. His smoking use included Cigarettes. He has a 55 pack-year smoking history. He does not have any smokeless tobacco history on file. He reports that he drinks alcohol. His drug history not on file. family history includes Breast cancer in his mother and Stroke in an unspecified family member. No Known Allergies    Review of Systems  Constitutional: Negative for fever, activity change, appetite change and fatigue.  HENT: Negative for ear pain, congestion and trouble swallowing.   Eyes: Negative for pain and visual disturbance.  Respiratory: Negative for cough, shortness of breath and wheezing.   Cardiovascular: Negative for chest pain and palpitations.  Gastrointestinal: Negative for nausea, vomiting, abdominal pain, diarrhea, constipation, blood in stool, abdominal distention and rectal pain.  Genitourinary: Negative for dysuria, hematuria and testicular pain.  Musculoskeletal: Negative for joint swelling and arthralgias.  Skin: Negative for rash.   Neurological: Negative for dizziness, syncope and headaches.  Hematological: Negative for adenopathy.  Psychiatric/Behavioral: Negative for confusion and dysphoric mood.       Objective:   Physical Exam  Constitutional: He is oriented to person, place, and time. He appears well-developed and well-nourished.  HENT:  Mouth/Throat: Oropharynx is clear and moist.  Neck: Neck supple. No thyromegaly present.  Cardiovascular: Normal rate and regular rhythm.   Pulmonary/Chest: Effort normal and breath sounds normal. No respiratory distress. He has no wheezes. He has no rales.  Musculoskeletal: He exhibits no edema.  Lymphadenopathy:    He has no cervical adenopathy.  Neurological: He is alert and oriented to person, place, and time.          Assessment & Plan:  #1 hypertension suboptimal control. Add amlodipine 5 mg daily and reassess blood pressure one month #2 hyperlipidemia. Check lipid and hepatic panel  #3 health maintenance. Pneumovax and tetanus booster given. Discussed smoking cessation. low motivation to quit. We've also recommended patient get yearly flu vaccine

## 2011-05-22 ENCOUNTER — Encounter: Payer: Self-pay | Admitting: Gastroenterology

## 2011-06-11 ENCOUNTER — Encounter: Payer: Self-pay | Admitting: Family Medicine

## 2011-06-11 ENCOUNTER — Ambulatory Visit (INDEPENDENT_AMBULATORY_CARE_PROVIDER_SITE_OTHER): Payer: Federal, State, Local not specified - PPO | Admitting: Family Medicine

## 2011-06-11 VITALS — BP 130/88 | Temp 97.7°F | Wt 217.0 lb

## 2011-06-11 DIAGNOSIS — I10 Essential (primary) hypertension: Secondary | ICD-10-CM

## 2011-06-11 MED ORDER — TRIAMCINOLONE ACETONIDE 0.1 % EX CREA: TOPICAL_CREAM | CUTANEOUS | Status: DC

## 2011-06-11 NOTE — Progress Notes (Signed)
  Subjective:    Patient ID: Ronald [name], male    DOB: 1945-12-28, 66 y.o.   MRN: [id number]  HPI  Patient here for followup hypertension. Recent poor control. Added amlodipine 5 mg daily. He denies any side effects such as headache or peripheral edema. Blood pressure improved on home readings and mostly at goal. Overall feels well. Denies any dizziness, headaches, shortness of breath, or chest pain. Requesting refill triamcinolone cream which he uses for occasional dermatitis.  Past Medical History  Diagnosis Date  . Hypertension   . Hyperlipidemia   . ALLERGIC RHINITIS   . Sleep apnea   . Gout   . Tinea versicolor    Past Surgical History  Procedure Date  . Tonsillectomy   . Spine surgery 2007    reports that he quit smoking about 13 months ago. His smoking use included Cigarettes. He has a 55 pack-year smoking history. He does not have any smokeless tobacco history on file. He reports that he drinks alcohol. His drug history not on file. family history includes Breast cancer in his mother and Stroke in an unspecified family member. No Known Allergies   Review of Systems  Constitutional: Negative for fatigue.  Eyes: Negative for visual disturbance.  Respiratory: Negative for cough, chest tightness and shortness of breath.   Cardiovascular: Negative for chest pain, palpitations and leg swelling.  Neurological: Negative for dizziness, syncope, weakness, light-headedness and headaches.       Objective:   Physical Exam  Constitutional: He appears well-developed and well-nourished.  Neck: Neck supple. No thyromegaly present.  Cardiovascular: Normal rate and regular rhythm.   No murmur heard. Pulmonary/Chest: Effort normal and breath sounds normal. No respiratory distress. He has no wheezes. He has no rales.  Musculoskeletal: He exhibits no edema.          Assessment & Plan:  Hypertension. Improved. Continue current medication regimen. Routine followup 6 months.  Refill triamcinolone cream

## 2011-06-21 ENCOUNTER — Encounter: Payer: Self-pay | Admitting: Gastroenterology

## 2011-06-21 ENCOUNTER — Ambulatory Visit (AMBULATORY_SURGERY_CENTER): Payer: Federal, State, Local not specified - PPO | Admitting: *Deleted

## 2011-06-21 VITALS — Ht 72.0 in | Wt 215.7 lb

## 2011-06-21 DIAGNOSIS — Z1211 Encounter for screening for malignant neoplasm of colon: Secondary | ICD-10-CM

## 2011-06-21 DIAGNOSIS — Z860100 Personal history of colon polyps, unspecified: Secondary | ICD-10-CM

## 2011-06-21 MED ORDER — PEG-KCL-NACL-NASULF-NA ASC-C 100 G PO SOLR: ORAL | Status: DC

## 2011-07-03 ENCOUNTER — Ambulatory Visit (INDEPENDENT_AMBULATORY_CARE_PROVIDER_SITE_OTHER): Payer: Federal, State, Local not specified - PPO | Admitting: Pulmonary Disease

## 2011-07-03 ENCOUNTER — Encounter: Payer: Self-pay | Admitting: Pulmonary Disease

## 2011-07-03 VITALS — BP 128/68 | HR 98 | Temp 97.9°F | Ht 71.0 in | Wt 218.0 lb

## 2011-07-03 DIAGNOSIS — G4733 Obstructive sleep apnea (adult) (pediatric): Secondary | ICD-10-CM

## 2011-07-03 NOTE — Progress Notes (Signed)
  Subjective:    Patient ID: Ronald [name], male    DOB: [birth year], 66 y.o.   MRN: [id number]  HPI Patient comes in today for followup of his known severe obstructive sleep apnea.  He is wearing CPAP compliantly, and is doing well with his sleep and daytime alertness.  He is having no issues with the pressure, but is having excoriation of the bridge of his nose.   Review of Systems  Constitutional: Negative for fever and unexpected weight change.  HENT: Positive for congestion, rhinorrhea, sneezing, postnasal drip and sinus pressure. Negative for ear pain, nosebleeds, sore throat, trouble swallowing and dental problem.   Eyes: Negative for redness and itching.  Respiratory: Negative for cough, chest tightness, shortness of breath and wheezing.   Cardiovascular: Negative for palpitations and leg swelling.  Gastrointestinal: Negative for nausea and vomiting.  Genitourinary: Negative for dysuria.  Musculoskeletal: Negative for joint swelling.  Skin: Negative for rash.  Neurological: Negative for headaches.  Hematological: Does not bruise/bleed easily.  Psychiatric/Behavioral: Negative for dysphoric mood. The patient is not nervous/anxious.        Objective:   Physical Exam Overweight male in no acute distress No skin breakdown or pressure necrosis from the CPAP mask.  He does have some excoriation of the bridge of his nose Lower extremities without edema, cyanosis Alert and oriented, moves all 4 extremities.       Assessment & Plan:

## 2011-07-03 NOTE — Assessment & Plan Note (Signed)
The patient is doing well from a sleep apnea standpoint on his CPAP device.  He feels that he is sleeping well, and is satisfied with his daytime alertness.  The only issue that he is having with his CPAP is difficulty with mask fitting.  I would likely get him in touch with his DME to look at an alternative full facemask.  I've also encouraged him to work aggressively on weight loss.

## 2011-07-03 NOTE — Patient Instructions (Signed)
Will try different mask to see if better fit Continue on cpap, work on weight loss followup with me in one year if doing well.

## 2011-07-05 ENCOUNTER — Ambulatory Visit (AMBULATORY_SURGERY_CENTER): Payer: Federal, State, Local not specified - PPO | Admitting: Gastroenterology

## 2011-07-05 ENCOUNTER — Encounter: Payer: Self-pay | Admitting: Gastroenterology

## 2011-07-05 VITALS — BP 141/97 | HR 85 | Temp 96.1°F | Resp 20 | Ht 72.0 in | Wt 215.0 lb

## 2011-07-05 DIAGNOSIS — Z1211 Encounter for screening for malignant neoplasm of colon: Secondary | ICD-10-CM

## 2011-07-05 DIAGNOSIS — D126 Benign neoplasm of colon, unspecified: Secondary | ICD-10-CM

## 2011-07-05 DIAGNOSIS — Z86010 Personal history of colonic polyps: Secondary | ICD-10-CM

## 2011-07-05 MED ORDER — SODIUM CHLORIDE 0.9 % IV SOLN: 500.0000 mL | INTRAVENOUS | Status: DC

## 2011-07-05 NOTE — Progress Notes (Signed)
Patient did not have preoperative order for IV antibiotic SSI prophylaxis. (G8918)  Patient did not experience any of the following events: a burn prior to discharge; a fall within the facility; wrong site/side/patient/procedure/implant event; or a hospital transfer or hospital admission upon discharge from the facility. (G8907)  

## 2011-07-05 NOTE — Patient Instructions (Signed)
YOU HAD AN ENDOSCOPIC PROCEDURE TODAY AT THE [facility]: Refer to the procedure report that was given to you for any specific questions about what was found during the examination.  If the procedure report does not answer your questions, please call your gastroenterologist to clarify.  If you requested that your care partner not be given the details of your procedure findings, then the procedure report has been included in a sealed envelope for you to review at your convenience later.  YOU SHOULD EXPECT: Some feelings of bloating in the abdomen. Passage of more gas than usual.  Walking can help get rid of the air that was put into your GI tract during the procedure and reduce the bloating. If you had a lower endoscopy (such as a colonoscopy or flexible sigmoidoscopy) you may notice spotting of blood in your stool or on the toilet paper. If you underwent a bowel prep for your procedure, then you may not have a normal bowel movement for a few days.  DIET: Your first meal following the procedure should be a light meal and then it is ok to progress to your normal diet.  A half-sandwich or bowl of soup is an example of a good first meal.  Heavy or fried foods are harder to digest and may make you feel nauseous or bloated.  Likewise meals heavy in dairy and vegetables can cause extra gas to form and this can also increase the bloating.  Drink plenty of fluids but you should avoid alcoholic beverages for 24 hours.  ACTIVITY: Your care partner should take you home directly after the procedure.  You should plan to take it easy, moving slowly for the rest of the day.  You can resume normal activity the day after the procedure however you should NOT DRIVE or use heavy machinery for 24 hours (because of the sedation medicines used during the test).    SYMPTOMS TO REPORT IMMEDIATELY: A gastroenterologist can be reached at any hour.  During normal business hours, 8:30 AM to 5:00 PM Monday through Friday,  call [phone].  After hours and on weekends, please call the GI answering service at [phone] who will take a message and have the physician on call contact you.   Following lower endoscopy (colonoscopy or flexible sigmoidoscopy):  Excessive amounts of blood in the stool  Significant tenderness or worsening of abdominal pains  Swelling of the abdomen that is new, acute  Fever of 100F or higher    FOLLOW UP: If any biopsies were taken you will be contacted by phone or by letter within the next 1-3 weeks.  Call your gastroenterologist if you have not heard about the biopsies in 3 weeks.  Our staff will call the home number listed on your records the next business day following your procedure to check on you and address any questions or concerns that you may have at that time regarding the information given to you following your procedure. This is a courtesy call and so if there is no answer at the home number and we have not heard from you through the emergency physician on call, we will assume that you have returned to your regular daily activities without incident.  SIGNATURES/CONFIDENTIALITY: You and/or your care partner have signed paperwork which will be entered into your electronic medical record.  These signatures attest to the fact that that the information above on your After Visit Summary has been reviewed and is understood.  Full responsibility of the confidentiality   of this discharge information lies with you and/or your care-partner.     

## 2011-07-05 NOTE — Op Note (Signed)
[facility] [address] [place], [place]  [address]  COLONOSCOPY PROCEDURE REPORT PATIENT:  Ronald [name], Ronald [name]  MR#:  [id number] BIRTHDATE:  1945-08-15, 66 yrs. old  GENDER:  male ENDOSCOPIST:  Judie Petit T. Russella Dar, MD, [facility]  PROCEDURE DATE:  07/05/2011 PROCEDURE:  Colonoscopy with snare polypectomy ASA CLASS:  Class II INDICATIONS:  1) surveillance and high-risk screening MEDICATIONS:   These medications were titrated to patient response per physician's verbal order, Fentanyl 50 mcg IV, Versed 7 mg IV DESCRIPTION OF PROCEDURE:   After the risks benefits and alternatives of the procedure were thoroughly explained, informed consent was obtained.  No rectal exam performed. The [id number] endoscope was introduced through the anus and advanced to the cecum, which was identified by both the appendix and ileocecal valve, without limitations.  The quality of the prep was good, using MoviPrep.  The instrument was then slowly withdrawn as the colon was fully examined. <<PROCEDUREIMAGES>> FINDINGS:  A sessile polyp was found in the mid transverse colon. It was 8 mm in size. Polyp was snared, then cauterized with monopolar cautery. Retrieval was successful. Moderate diverticulosis was found in the sigmoid to cecum.  Otherwise normal colonoscopy without other polyps, masses, vascular ectasias, or inflammatory changes.  Retroflexed views in the rectum revealed internal hemorrhoids, small.  The time to cecum = 2  minutes. The scope was then withdrawn (time =  13.75  min) from the patient and the procedure completed.  COMPLICATIONS:  None  ENDOSCOPIC IMPRESSION: 1) 8 mm sessile polyp in the mid transverse colon 2) Moderate diverticulosis in the sigmoid to cecum 3) Internal hemorrhoids  RECOMMENDATIONS: 1) Hold aspirin, aspirin products, and anti-inflammatory medication for 2 weeks. 2) Await pathology results 3) High fiber diet with liberal fluid intake. 4) Repeat Colonoscopy in 5  years.  Venita Lick. Russella Dar, MD, [facility]  n. eSIGNED:   Venita Lick. Solan Vosler at 07/05/2011 09:25 AM  Colin Benton, [phone]

## 2011-07-09 ENCOUNTER — Telehealth: Payer: Self-pay | Admitting: *Deleted

## 2011-07-09 NOTE — Telephone Encounter (Signed)
  Follow up Call-  Call back number 07/05/2011  Post procedure Call Back phone  # [phone]  Permission to leave phone message Yes     Patient questions:  Do you have a fever, pain , or abdominal swelling? no Pain Score  0 *  Have you tolerated food without any problems? yes  Have you been able to return to your normal activities? yes  Do you have any questions about your discharge instructions: Diet   no Medications  no Follow up visit  no  Do you have questions or concerns about your Care? no  Actions: * If pain score is 4 or above: No action needed, pain <4.

## 2011-07-12 ENCOUNTER — Encounter: Payer: Self-pay | Admitting: Gastroenterology

## 2012-01-09 ENCOUNTER — Other Ambulatory Visit: Payer: Self-pay | Admitting: Family Medicine

## 2012-01-09 MED ORDER — INDOMETHACIN 50 MG PO CAPS: ORAL_CAPSULE | ORAL | Status: DC

## 2012-01-09 NOTE — Telephone Encounter (Signed)
Pt needs new rx indomethacin call into [facility]. Pt is having gout flare up

## 2012-01-15 ENCOUNTER — Other Ambulatory Visit: Payer: Self-pay | Admitting: Family Medicine

## 2012-02-25 ENCOUNTER — Telehealth: Payer: Self-pay | Admitting: Family Medicine

## 2012-02-25 DIAGNOSIS — Z0000 Encounter for general adult medical examination without abnormal findings: Secondary | ICD-10-CM

## 2012-02-25 MED ORDER — ENALAPRIL-HYDROCHLOROTHIAZIDE 10-25 MG PO TABS: 30.0000 | ORAL_TABLET | Freq: Every day | ORAL | Status: DC

## 2012-02-25 NOTE — Telephone Encounter (Signed)
Rx sent, PSA ordered

## 2012-02-25 NOTE — Telephone Encounter (Signed)
Patient called and set up cpx for 12/12. He has enough of all his meds except 1 to get to that appt. Requesting a 30-day refill of enalapril-hydrochlorothiazide (VASERETIC) 10-25 MG per tablet [facility] - NOT mail order!!   Also, he would like to add a PSA to his cpx labs (12/2/) - he wasn't sure if that was standard.  Thank you.

## 2012-03-10 ENCOUNTER — Other Ambulatory Visit (INDEPENDENT_AMBULATORY_CARE_PROVIDER_SITE_OTHER): Payer: Federal, State, Local not specified - PPO

## 2012-03-10 DIAGNOSIS — Z0000 Encounter for general adult medical examination without abnormal findings: Secondary | ICD-10-CM

## 2012-03-10 LAB — CBC WITH DIFFERENTIAL/PLATELET
Basophils Relative: 0.5 % (ref 0.0–3.0)
Eosinophils Relative: 1.9 % (ref 0.0–5.0)
HCT: 48.1 % (ref 39.0–52.0)
MCV: 99.4 fl (ref 78.0–100.0)
Monocytes Relative: 7.3 % (ref 3.0–12.0)
Neutrophils Relative %: 69 % (ref 43.0–77.0)
Platelets: 198 10*3/uL (ref 150.0–400.0)
RBC: 4.83 Mil/uL (ref 4.22–5.81)
WBC: 7.3 10*3/uL (ref 4.5–10.5)

## 2012-03-10 LAB — LIPID PANEL
HDL: 34.5 mg/dL — ABNORMAL LOW (ref >39.00)
Total CHOL/HDL Ratio: 4
VLDL: 85 mg/dL — ABNORMAL HIGH (ref 0.0–40.0)

## 2012-03-10 LAB — BASIC METABOLIC PANEL
BUN: 16 mg/dL (ref 6–23)
Chloride: 98 mEq/L (ref 96–112)
Creatinine, Ser: 1 mg/dL (ref 0.4–1.5)
GFR: 82.1 mL/min (ref >60.00)
Potassium: 3.7 mEq/L (ref 3.5–5.1)

## 2012-03-10 LAB — HEPATIC FUNCTION PANEL
ALT: 21 U/L (ref 0–53)
AST: 20 U/L (ref 0–37)
Bilirubin, Direct: 0.1 mg/dL (ref 0.0–0.3)
Total Bilirubin: 1 mg/dL (ref 0.3–1.2)
Total Protein: 6.4 g/dL (ref 6.0–8.3)

## 2012-03-10 LAB — POCT URINALYSIS DIPSTICK
Bilirubin, UA: NEGATIVE
Blood, UA: NEGATIVE
Ketones, UA: NEGATIVE
Protein, UA: NEGATIVE
Spec Grav, UA: 1.015
pH, UA: 7

## 2012-03-10 LAB — LDL CHOLESTEROL, DIRECT: Direct LDL: 57.8 mg/dL

## 2012-03-10 LAB — PSA: PSA: 4.89 ng/mL — ABNORMAL HIGH (ref 0.10–4.00)

## 2012-03-18 ENCOUNTER — Encounter: Payer: Federal, State, Local not specified - PPO | Admitting: Family Medicine

## 2012-03-20 ENCOUNTER — Ambulatory Visit (INDEPENDENT_AMBULATORY_CARE_PROVIDER_SITE_OTHER): Payer: Federal, State, Local not specified - PPO | Admitting: Family Medicine

## 2012-03-20 ENCOUNTER — Encounter: Payer: Self-pay | Admitting: Family Medicine

## 2012-03-20 VITALS — BP 130/90 | HR 72 | Temp 98.6°F | Resp 12 | Ht 70.5 in | Wt 220.0 lb

## 2012-03-20 DIAGNOSIS — Z0000 Encounter for general adult medical examination without abnormal findings: Secondary | ICD-10-CM

## 2012-03-20 DIAGNOSIS — I10 Essential (primary) hypertension: Secondary | ICD-10-CM

## 2012-03-20 DIAGNOSIS — R972 Elevated prostate specific antigen [PSA]: Secondary | ICD-10-CM | POA: Insufficient documentation

## 2012-03-20 DIAGNOSIS — E785 Hyperlipidemia, unspecified: Secondary | ICD-10-CM

## 2012-03-20 DIAGNOSIS — E1165 Type 2 diabetes mellitus with hyperglycemia: Secondary | ICD-10-CM | POA: Insufficient documentation

## 2012-03-20 DIAGNOSIS — R7309 Other abnormal glucose: Secondary | ICD-10-CM

## 2012-03-20 DIAGNOSIS — R7303 Prediabetes: Secondary | ICD-10-CM

## 2012-03-20 DIAGNOSIS — E8881 Metabolic syndrome: Secondary | ICD-10-CM | POA: Insufficient documentation

## 2012-03-20 MED ORDER — AMLODIPINE BESYLATE 5 MG PO TABS: 5.0000 mg | ORAL_TABLET | Freq: Every day | ORAL | Status: DC

## 2012-03-20 MED ORDER — FLUTICASONE PROPIONATE 50 MCG/ACT NA SUSP: 2.0000 | Freq: Every day | NASAL | Status: DC

## 2012-03-20 MED ORDER — ATORVASTATIN CALCIUM 10 MG PO TABS: 10.0000 mg | ORAL_TABLET | Freq: Every day | ORAL | Status: DC

## 2012-03-20 MED ORDER — ENALAPRIL-HYDROCHLOROTHIAZIDE 10-25 MG PO TABS: 30.0000 | ORAL_TABLET | Freq: Every day | ORAL | Status: DC

## 2012-03-20 NOTE — Patient Instructions (Addendum)
Check on insurance coverage for shingles vaccine. I will set up urology referral.  Metabolic Syndrome, Adult Metabolic syndrome descibes a group of risk factors for heart disease and diabetes. This syndrome has other names including Insulin Resistance Syndrome. The more risk factors you have, the higher your risk of having a heart attack, stroke, or developing diabetes. These risk factors include:  High blood sugar.  High blood triglyceride (a fat found in the blood) level.  High blood pressure.  Abdominal obesity (your extra weight is around your waist instead of your hips).  Low levels of high-density lipoprotein, HDL (good blood cholesterol). If you have any three of these risk factors, you have metabolic syndrome. If you have even one of these factors, you should make lifestyle changes to improve your health in order to prevent serious health diseases.  In people with metabolic syndrome, the cells do not respond properly to insulin. This can lead to high levels of glucose in the blood, which can interfere with normal body processes. Eventually, this can cause high blood pressure and higher fat levels in the blood, and inflammation of your blood vessels. The result can be heart disease and stroke.  CAUSES   Eating a diet rich in calories and saturated fat.  Too little physical activity.  Being overweight. Other underlying causes are:  Family history (genetics).  Ethnicity (South Asians are at a higher risk).  Older age (your chances of developing metabolic syndrome are higher as you grow older).  Insulin resistance. SYMPTOMS  By itself, metabolic syndrome has no symptoms. However, you might have symptoms of diabetes (high blood sugar) or high blood pressure, such as:  Increased thirst, urination, and tiredness.  Dizzy spells.  Dull headaches that are unusual for you.  Blurred vision.  Nosebleeds. DIAGNOSIS  Your caregiver may make a diagnosis of metabolic syndrome if  you have at least three of these factors:  If you are overweight mostly around the waist. This means a waistline greater than 40" in men and more than 35" in women. The waistline limits are 31 to 35 inches for women and 37 to 39 inches for men. In those who have certain genetic risk factors, such as having a family history of diabetes or being of Asian descent.  If you have a blood pressure of 130/85 mm Hg or more, or if you are being treated for high blood pressure.  If your blood triglyceride level is 150 mg/dL or more, or you are being treated for high levels of triglyceride.  If the level of HDL in your blood is below 40 mg/dL in men, less than 50 mg/dL in women, or you are receiving treatment for low levels of HDL.  If the level of sugar in your blood is high with fasting blood sugar level of 110 mg/dL or more, or you are under treatment for diabetes. TREATMENT  Your caregiver may have you make lifestyle changes, which may include:  Exercise.  Losing weight.  Maintaining a healthy diet.  Quitting smoking. The lifestyle changes listed above are key in reducing your risk for heart disease and stroke. Medicines may also be prescribed to help your body respond to insulin better and to reduce your blood pressure and blood fat levels. Aspirin may be recommended to reduce risks of heart disease or stroke.  HOME CARE INSTRUCTIONS   Exercise.  Measure your waist at regular intervals just above the hipbones after you have breathed out.  Maintain a healthy diet.  Eat fruits, such  as apples, oranges, and pears.  Eat vegetables.  Eat legumes, such as kidney beans, peas, and lentils.  Eat food rich in soluble fiber, such as whole grain cereal, oatmeal, and oat bran.  Use olive or safflower oils and avoid saturated fats.  Eat nuts.  Limit the amount of salt you eat or add to food.  Limit the amount of alcohol you drink.  Include fish in your diet, if possible.  Stop smoking if  you are a smoker.  Maintain regular follow-up appointments.  Follow your caregiver's advice. SEEK MEDICAL CARE IF:   You feel very tired or fatigued.  You develop excessive thirst.  You pass large quantities of urine.  You are putting on weight around your waist rather than losing weight.  You develop headaches over and over again.  You have off-and-on dizzy spells. SEEK IMMEDIATE MEDICAL CARE IF:   You develop nosebleeds.  You develop sudden blurred vision.  You develop sudden dizzy spells.  You develop chest pains, trouble breathing, or feel an abnormal or irregular heart beat.  You have a fainting episode.  You develop any sudden trouble speaking and/or swallowing.  You develop sudden weakness in one arm and/or one leg. MAKE SURE YOU:   Understand these instructions.  Will watch your condition.  Will get help right away if you are not doing well or get worse. Document Released: 07/03/2007 Document Revised: 06/18/2011 Document Reviewed: 07/03/2007 [facility] Patient Information 2013 [place], [place].

## 2012-03-20 NOTE — Progress Notes (Signed)
Subjective:    Patient ID: Ronald [name], male    DOB: 04-Dec-1945, 66 y.o.   MRN: [id number]  HPI  Here for complete physical and medical followup. Hypertension, dyslipidemia, allergic rhinitis, gout, obstructive sleep apnea, and ongoing nicotine use. Medications reviewed. He needs refills of blood pressure medications. Also takes Lipitor for hyperlipidemia. He uses Flonase for allergies and needs refills of all these. He had past history of prediabetes. No symptoms of hyperglycemia.  Immunizations are up to date with exception no shingles vaccine. He's not sure of coverage for this. He's had some mild weight gain during the past year. No chest pains. Colonoscopy last year  Past Medical History  Diagnosis Date  . Hypertension   . Hyperlipidemia   . ALLERGIC RHINITIS   . Sleep apnea     wears CPAP  . Gout   . Tinea versicolor    Past Surgical History  Procedure Date  . Tonsillectomy   . Spine surgery 2007  . Colonoscopy     reports that he has been smoking Cigarettes.  He has a 55 pack-year smoking history. He does not have any smokeless tobacco history on file. He reports that he drinks about 3 ounces of alcohol per week. He reports that he does not use illicit drugs. family history includes Breast cancer in his mother and Stroke in an unspecified family member.  There is no history of Colon cancer, and Esophageal cancer, and Rectal cancer, and Stomach cancer, . No Known Allergies    Review of Systems  Constitutional: Positive for fatigue. Negative for fever, activity change and appetite change.  HENT: Negative for ear pain, congestion and trouble swallowing.   Eyes: Negative for pain and visual disturbance.  Respiratory: Negative for cough, shortness of breath and wheezing.   Cardiovascular: Negative for chest pain and palpitations.  Gastrointestinal: Negative for nausea, vomiting, abdominal pain, diarrhea, constipation, blood in stool, abdominal distention and rectal pain.   Genitourinary: Negative for dysuria, hematuria and decreased urine volume.  Musculoskeletal: Negative for joint swelling and arthralgias.  Skin: Negative for rash.  Neurological: Negative for dizziness, syncope and headaches.  Hematological: Negative for adenopathy.  Psychiatric/Behavioral: Negative for confusion and dysphoric mood.       Objective:   Physical Exam  Constitutional: He is oriented to person, place, and time. He appears well-developed and well-nourished.  HENT:  Right Ear: External ear normal.  Left Ear: External ear normal.  Mouth/Throat: Oropharynx is clear and moist.  Neck: Neck supple. No thyromegaly present.  Cardiovascular: Normal rate and regular rhythm.   Pulmonary/Chest: Effort normal and breath sounds normal. No respiratory distress. He has no wheezes. He has no rales.  Abdominal: Soft. Bowel sounds are normal. He exhibits no distension and no mass. There is no tenderness. There is no rebound and no guarding.  Genitourinary:       Prostate is nontender. He has slight nodular thickening left lobe of prostate. No rectal mass  Musculoskeletal: He exhibits no edema.  Lymphadenopathy:    He has no cervical adenopathy.  Neurological: He is alert and oriented to person, place, and time.  Skin: No rash noted.          Assessment & Plan:  Complete physical. Patient will check on coverage for shingles vaccine. Other immunizations up to date. Already had flu vaccine. He has metabolic syndrome based on hypertension, elevated triglycerides, low HDL, increased waist circumference - 40, and hyperglycemia. We discussed the significance of risk modification especially quitting smoking though  his motivation is fairly low  Hypertension. Stable. Refilled medications for one year  Hyperlipidemia. He has elevated triglycerides and low HDL but LDL is well controlled. Refill Lipitor for one year. Discussed measures to reduce triglycerides with carbohydrate  reduction  Allergic rhinitis. Refill Flonase for one year  Elevated PSA. With thickening left lobe of prostate and question of nodule go ahead with urology referral  Elevated glucose. Probably has early type 2 diabetes. Lose weight and followup in 3 months and repeat fasting blood sugar and A1c then. Consider metformin at that point if no progress.  Offered nutrition counseling and is not interested

## 2012-03-21 ENCOUNTER — Other Ambulatory Visit: Payer: Self-pay | Admitting: *Deleted

## 2012-03-21 MED ORDER — ENALAPRIL-HYDROCHLOROTHIAZIDE 10-25 MG PO TABS: 1.0000 | ORAL_TABLET | Freq: Every day | ORAL | Status: DC

## 2012-05-15 HISTORY — PX: PROSTATE BIOPSY: SHX241

## 2012-05-29 ENCOUNTER — Encounter: Payer: Self-pay | Admitting: Radiation Oncology

## 2012-05-30 ENCOUNTER — Encounter: Payer: Self-pay | Admitting: Oncology

## 2012-06-02 ENCOUNTER — Ambulatory Visit
Admission: RE | Admit: 2012-06-02 | Discharge: 2012-06-02 | Disposition: A | Discharge status: Home / Self Care | Payer: Federal, State, Local not specified - PPO | Source: Ambulatory Visit | Attending: Radiation Oncology | Admitting: Radiation Oncology

## 2012-06-02 ENCOUNTER — Encounter: Payer: Self-pay | Admitting: Radiation Oncology

## 2012-06-02 VITALS — BP 132/76 | HR 91 | Temp 98.1°F | Wt 211.1 lb

## 2012-06-02 DIAGNOSIS — C61 Malignant neoplasm of prostate: Secondary | ICD-10-CM

## 2012-06-02 DIAGNOSIS — I10 Essential (primary) hypertension: Secondary | ICD-10-CM | POA: Insufficient documentation

## 2012-06-02 DIAGNOSIS — Z803 Family history of malignant neoplasm of breast: Secondary | ICD-10-CM | POA: Insufficient documentation

## 2012-06-02 DIAGNOSIS — G4730 Sleep apnea, unspecified: Secondary | ICD-10-CM | POA: Insufficient documentation

## 2012-06-02 DIAGNOSIS — E785 Hyperlipidemia, unspecified: Secondary | ICD-10-CM | POA: Insufficient documentation

## 2012-06-02 DIAGNOSIS — R972 Elevated prostate specific antigen [PSA]: Secondary | ICD-10-CM

## 2012-06-02 DIAGNOSIS — F17200 Nicotine dependence, unspecified, uncomplicated: Secondary | ICD-10-CM | POA: Insufficient documentation

## 2012-06-02 DIAGNOSIS — Z7982 Long term (current) use of aspirin: Secondary | ICD-10-CM | POA: Insufficient documentation

## 2012-06-02 DIAGNOSIS — Z79899 Other long term (current) drug therapy: Secondary | ICD-10-CM | POA: Insufficient documentation

## 2012-06-02 NOTE — Progress Notes (Signed)
Please see the Nurse Progress Note in the MD Initial Consult Encounter for this patient. 

## 2012-06-02 NOTE — Progress Notes (Signed)
Patient and spouse  here for radiation consultation of newly diagnosed prostate cancer.No family h/o of prostate caner. IPSS 18 Gleaso 3+3=6 PSA6.63 on 04/21/12

## 2012-06-02 NOTE — Addendum Note (Signed)
Encounter addended by: Delynn Flavin, RN on: 06/02/2012  7:27 PM<BR>     Documentation filed: Charges VN

## 2012-06-02 NOTE — Progress Notes (Signed)
Radiation Oncology         [phone] ________________________________  Initial outpatient Consultation  Name: Ronald [name] MRN: [id number]  Date: 06/02/2012  DOB: 11-Nov-1945  WG:[id number],VHQIO W, MD  Lindaann Slough, MD   REFERRING PHYSICIAN: Lindaann Slough, MD  DIAGNOSIS: 67 y.o. gentleman with stage T1c adenocarcinoma of the prostate with a Gleason's score of 3+3 and a PSA of 6.67  HISTORY OF PRESENT ILLNESS::Ronald [name] is a 67 y.o. gentleman.  He was noted to have an elevated PSA of 4.89 by his primary care physician, Dr. Caryl Never.  Accordingly, he was referred for evaluation in urology by Dr. Brunilda Payor on 04/21/12,  digital rectal examination was performed at that time revealing a 2+ gland with no nodules.  Repeat PSA that day was 6.83.  The patient proceeded to transrectal ultrasound with 12 biopsies of the prostate on 05/25/12.  The prostate volume measured 45.88 cc.  Out of 12 core biopsies, one was positive.  The maximum Gleason score was 3+3, and this was seen in less than 5% of the left apex.  The patient reviewed the biopsy results with his urologist and he has kindly been referred today for discussion of potential radiation treatment options.  PREVIOUS RADIATION THERAPY: No  PAST MEDICAL HISTORY:  has a past medical history of Hypertension; Hyperlipidemia; ALLERGIC RHINITIS; Sleep apnea; Gout; Tinea versicolor; and Prostate cancer.    PAST SURGICAL HISTORY: Past Surgical History  Procedure Laterality Date  . Tonsillectomy    . Spine surgery  2007  . Colonoscopy    . Surgical, transverse colon, polyp  07/05/11    Tubular Adenoma< No High Grade Dysplsia or Malignancy Identified  . Prostate biopsy  05/15/12    Clinically both Lobes    FAMILY HISTORY: family history includes Breast cancer in his mother and Stroke in an unspecified family member.  There is no history of Colon cancer, and Esophageal cancer, and Rectal cancer, and Stomach cancer, .  SOCIAL HISTORY:   reports that he has been smoking Cigarettes.  He has a 55 pack-year smoking history. He does not have any smokeless tobacco history on file. He reports that he drinks about 3.0 ounces of alcohol per week. He reports that he does not use illicit drugs.  ALLERGIES: Review of patient's allergies indicates no known allergies.  MEDICATIONS:  Current Outpatient Prescriptions  Medication Sig Dispense Refill  . amLODipine (NORVASC) 5 MG tablet Take 1 tablet (5 mg total) by mouth daily.  90 tablet  3  . aspirin 81 MG chewable tablet Chew 81 mg by mouth daily.        Marland Kitchen atorvastatin (LIPITOR) 10 MG tablet Take 1 tablet (10 mg total) by mouth daily.  90 tablet  3  . enalapril-hydrochlorothiazide (VASERETIC) 10-25 MG per tablet Take 1 tablet by mouth daily.  90 tablet  3  . fexofenadine (ALLEGRA) 180 MG tablet Take by mouth daily.        . fish oil-omega-3 fatty acids 1000 MG capsule Take by mouth daily       . fluticasone (FLONASE) 50 MCG/ACT nasal spray Place 2 sprays into the nose daily.  48 g  3  . indomethacin (INDOCIN) 50 MG capsule Take one by mouth every 8 hours as needed for gout pain  30 capsule  1  . ketoconazole (NIZORAL) 200 MG tablet Take 2 tablets by mouth times one dose as needed tinea versicolor       . nicotine (NICOTROL) 10 MG inhaler Use as  directed 6-16 inhalations as needed daily, taper as directed       . triamcinolone cream (KENALOG) 0.1 % Apply to affected rash tow times a day as needed  85.2 g  3   No current facility-administered medications for this encounter.    REVIEW OF SYSTEMS:  A 15 point review of systems is documented in the electronic medical record. This was obtained by the nursing staff. However, I reviewed this with the patient to discuss relevant findings and make appropriate changes.  A comprehensive review of systems was negative..  The patient completed an IPSS and IIEF questionnaire.  His IPSS score was 18 indicating moderate urinary outflow obstructive symptoms.      PHYSICAL EXAM: This patient is in no acute distress.  He is alert and oriented.   vitals were not taken for this visit. He exhibits no respiratory distress or labored breathing.  He appears neurologically intact.  His mood is pleasant.  His affect is appropriate.  Please note the digital rectal exam findings described above.  LABORATORY DATA:  Lab Results  Component Value Date   WBC 7.3 03/10/2012   HGB 16.2 03/10/2012   HCT 48.1 03/10/2012   MCV 99.4 03/10/2012   PLT 198.0 03/10/2012   Lab Results  Component Value Date   NA 137 03/10/2012   K 3.7 03/10/2012   CL 98 03/10/2012   CO2 29 03/10/2012   Lab Results  Component Value Date   ALT 21 03/10/2012   AST 20 03/10/2012   ALKPHOS 66 03/10/2012   BILITOT 1.0 03/10/2012     RADIOGRAPHY: No results found.    IMPRESSION: This gentleman is a 67 y.o. gentleman with stage T1c adenocarcinoma of the prostate with a Gleason's score of 3+3 and a PSA of 6.67.  His T-Stage, Gleason's Score, and PSA put him into the favorable risk group.  Accordingly he is eligible for a variety of potential treatment options including active surveillance, radical prostatectomy, external beam radiation, and prostate seed implant.Marland Kitchen  PLAN:Today I reviewed the findings and workup thus far.  We discussed the natural history of prostate cancer.  We reviewed the the implications of T-stage, Gleason's Score, and PSA on decision-making and outcomes in prostate cancer.  We discussed radiation treatment in the management of prostate cancer with regard to the logistics and delivery of external beam radiation treatment as well as the logistics and delivery of prostate brachytherapy.  We compared and contrasted each of these approaches and also compared these against prostatectomy.  The patient expressed interest in active surveillance.   The patient would like to proceed with active surveillance.  I will share my findings with Dr. Brunilda Payor.   The patient is due to return to urology in  May for followup PSA.  I enjoyed meeting with him today, and will look forward to participating in the care of this very nice gentleman.   I spent 40 minutes face to face with the patient and more than 50% of that time was spent in counseling and/or coordination of care.   ------------------------------------------------  Artist Pais. Kathrynn Running, M.D.

## 2012-06-02 NOTE — Addendum Note (Signed)
Encounter addended by: Tessa Lerner, RN on: 06/02/2012  5:02 PM<BR>     Documentation filed: Charges VN

## 2012-06-17 ENCOUNTER — Encounter: Payer: Self-pay | Admitting: Family Medicine

## 2012-06-18 ENCOUNTER — Ambulatory Visit (INDEPENDENT_AMBULATORY_CARE_PROVIDER_SITE_OTHER): Payer: Federal, State, Local not specified - PPO | Admitting: Family Medicine

## 2012-06-18 ENCOUNTER — Encounter: Payer: Self-pay | Admitting: Family Medicine

## 2012-06-18 VITALS — BP 140/90 | Temp 97.7°F | Wt 206.0 lb

## 2012-06-18 DIAGNOSIS — R7309 Other abnormal glucose: Secondary | ICD-10-CM

## 2012-06-18 DIAGNOSIS — R739 Hyperglycemia, unspecified: Secondary | ICD-10-CM

## 2012-06-18 NOTE — Patient Instructions (Addendum)

## 2012-06-18 NOTE — Progress Notes (Signed)
  Subjective:    Patient ID: Ronald [name], male    DOB: 03-12-1946, 67 y.o.   MRN: [id number]  HPI Followup regarding hyperglycemia. Patient is metabolic syndrome. Previous several fasting blood sugars prediabetes range. At physical last visit, fasting blood sugar 140 Patient made some dietary changes as walking more for exercise He has lost 14 pounds in general he feels very well.  Elevated PSA at physical with nodularity prostate. Had biopsies with urology positive for adenocarcinoma. Gleason score 6. He is considering consultation at [facility] for second opinion regarding treatment  Past Medical History  Diagnosis Date  . Hypertension   . Hyperlipidemia   . ALLERGIC RHINITIS   . Sleep apnea     wears CPAP  . Gout   . Tinea versicolor   . Prostate cancer     Gleason 6, psa 6.63 on 04/21/2012   Past Surgical History  Procedure Laterality Date  . Tonsillectomy    . Spine surgery  2007    lower  . Colonoscopy    . Surgical, transverse colon, polyp  07/05/11    Tubular Adenoma< No High Grade Dysplsia or Malignancy Identified  . Prostate biopsy  05/15/12    Clinically both Lobes    reports that he has been smoking Cigarettes.  He has a 55 pack-year smoking history. He does not have any smokeless tobacco history on file. He reports that he drinks about 3.0 ounces of alcohol per week. He reports that he does not use illicit drugs. family history includes Breast cancer in his mothers; Ovarian cancer in his sister; and Stroke in an unspecified family member.  There is no history of Colon cancer, and Esophageal cancer, and Rectal cancer, and Stomach cancer, . No Known Allergies    Review of Systems  Constitutional: Negative for fatigue and unexpected weight change.  Eyes: Negative for visual disturbance.  Respiratory: Negative for cough, chest tightness and shortness of breath.   Cardiovascular: Negative for chest pain, palpitations and leg swelling.  Endocrine: Negative for  polydipsia and polyuria.  Neurological: Negative for dizziness, syncope, weakness, light-headedness and headaches.       Objective:   Physical Exam  Constitutional: He appears well-developed and well-nourished.  Cardiovascular: Normal rate and regular rhythm.   Pulmonary/Chest: Effort normal and breath sounds normal. No respiratory distress. He has no wheezes. He has no rales.          Assessment & Plan:  Hyperglycemia. Excellent job of weight loss. Recheck fasting blood sugar and A1c Fasting CBG 127.

## 2012-06-19 ENCOUNTER — Ambulatory Visit (INDEPENDENT_AMBULATORY_CARE_PROVIDER_SITE_OTHER): Payer: Federal, State, Local not specified - PPO | Admitting: Family Medicine

## 2012-06-19 ENCOUNTER — Encounter: Payer: Self-pay | Admitting: Family Medicine

## 2012-06-19 VITALS — BP 150/90 | Temp 98.5°F

## 2012-06-19 DIAGNOSIS — N433 Hydrocele, unspecified: Secondary | ICD-10-CM | POA: Insufficient documentation

## 2012-06-19 DIAGNOSIS — K4090 Unilateral inguinal hernia, without obstruction or gangrene, not specified as recurrent: Secondary | ICD-10-CM

## 2012-06-19 NOTE — Patient Instructions (Addendum)

## 2012-06-19 NOTE — Progress Notes (Signed)
Quick Note:  Pt informed ______ 

## 2012-06-19 NOTE — Progress Notes (Signed)
  Subjective:    Patient ID: Ronald [name], male    DOB: 1945-10-28, 67 y.o.   MRN: [id number]  HPI Patient was just in yesterday. He forgot to mention that about 5-6 weeks ago had right groin pain He attributed this to a strain. Swelling went away and then yesterday he noted slightly painful bulge right inguinal region.  Was able to reduce this and has had no pain since then. No specific injury.  Patient has large left-sided hydrocele confirmed with scrotal ultrasound 2009 Followed by urology for prostate cancer  Past Medical History  Diagnosis Date  . Hypertension   . Hyperlipidemia   . ALLERGIC RHINITIS   . Sleep apnea     wears CPAP  . Gout   . Tinea versicolor   . Prostate cancer     Gleason 6, psa 6.63 on 04/21/2012   Past Surgical History  Procedure Laterality Date  . Tonsillectomy    . Spine surgery  2007    lower  . Colonoscopy    . Surgical, transverse colon, polyp  07/05/11    Tubular Adenoma< No High Grade Dysplsia or Malignancy Identified  . Prostate biopsy  05/15/12    Clinically both Lobes    reports that he has been smoking Cigarettes.  He has a 55 pack-year smoking history. He does not have any smokeless tobacco history on file. He reports that he drinks about 3.0 ounces of alcohol per week. He reports that he does not use illicit drugs. family history includes Breast cancer in his mothers; Ovarian cancer in his sister; and Stroke in an unspecified family member.  There is no history of Colon cancer, and Esophageal cancer, and Rectal cancer, and Stomach cancer, . No Known Allergies    Review of Systems  Constitutional: Negative for fever, chills, appetite change and unexpected weight change.  Genitourinary: Negative for dysuria.  Hematological: Negative for adenopathy.       Objective:   Physical Exam  Constitutional: He appears well-developed and well-nourished.  Cardiovascular: Normal rate and regular rhythm.   Pulmonary/Chest: Effort normal and  breath sounds normal. No respiratory distress. He has no wheezes. He has no rales.  Genitourinary:  Patient has right inguinal hernia. Soft and nontender. No inguinal adenopathy. He has large left-sided hydrocele          Assessment & Plan:  Right inguinal hernia. Symptomatic yesterday but not today. Get in to see general surgeon

## 2012-06-23 ENCOUNTER — Ambulatory Visit (INDEPENDENT_AMBULATORY_CARE_PROVIDER_SITE_OTHER): Payer: Federal, State, Local not specified - PPO | Admitting: Surgery

## 2012-06-23 ENCOUNTER — Encounter (INDEPENDENT_AMBULATORY_CARE_PROVIDER_SITE_OTHER): Payer: Self-pay | Admitting: Surgery

## 2012-06-23 VITALS — BP 142/90 | HR 68 | Temp 97.8°F | Resp 18 | Ht 71.0 in | Wt 210.0 lb

## 2012-06-23 DIAGNOSIS — K4090 Unilateral inguinal hernia, without obstruction or gangrene, not specified as recurrent: Secondary | ICD-10-CM | POA: Insufficient documentation

## 2012-06-23 DIAGNOSIS — K429 Umbilical hernia without obstruction or gangrene: Secondary | ICD-10-CM

## 2012-06-23 NOTE — Progress Notes (Signed)
Patient ID: Ronald [name], male   DOB: [birth year], 67 y.o.   MRN: [id number]  Chief Complaint  Patient presents with  . Other    hernia    HPI Ronald [name] is a 67 y.o. male.   HPI This pleasant gentleman is referred by Dr. Caryl Never for evaluation of asymptomatic right inguinal hernia. He developed pain in the groin several weeks ago while lifting heavy objects. He has since noticed a reducible bulge. The pain is moderate in intensity. It is not refer any where else. It is sharp and burning in nature. He denies any nausea, vomiting, or obstructive symptoms. He is otherwise without complaints. Past Medical History  Diagnosis Date  . Hypertension   . Hyperlipidemia   . ALLERGIC RHINITIS   . Sleep apnea     wears CPAP  . Gout   . Tinea versicolor   . Prostate cancer     Gleason 6, psa 6.63 on 04/21/2012    Past Surgical History  Procedure Laterality Date  . Tonsillectomy    . Spine surgery  2007    lower  . Colonoscopy    . Surgical, transverse colon, polyp  07/05/11    Tubular Adenoma< No High Grade Dysplsia or Malignancy Identified  . Prostate biopsy  05/15/12    Clinically both Lobes    Family History  Problem Relation Age of Onset  . Breast cancer Mother   . Stroke      Unknown   . Colon cancer Neg Hx   . Esophageal cancer Neg Hx   . Rectal cancer Neg Hx   . Stomach cancer Neg Hx   . Breast cancer Mother   . Ovarian cancer Sister     Social History History  Substance Use Topics  . Smoking status: Current Every Day Smoker -- 1.00 packs/day for 55 years    Types: Cigarettes  . Smokeless tobacco: Not on file  . Alcohol Use: 3.0 oz/week    6 drink(s) per week    No Known Allergies  Current Outpatient Prescriptions  Medication Sig Dispense Refill  . amLODipine (NORVASC) 5 MG tablet Take 1 tablet (5 mg total) by mouth daily.  90 tablet  3  . aspirin 81 MG chewable tablet Chew 81 mg by mouth daily.        Marland Kitchen atorvastatin (LIPITOR) 10 MG tablet Take 1 tablet  (10 mg total) by mouth daily.  90 tablet  3  . enalapril-hydrochlorothiazide (VASERETIC) 10-25 MG per tablet Take 1 tablet by mouth daily.  90 tablet  3  . fexofenadine (ALLEGRA) 180 MG tablet Take by mouth daily.        . fish oil-omega-3 fatty acids 1000 MG capsule Take by mouth daily       . fluticasone (FLONASE) 50 MCG/ACT nasal spray Place 2 sprays into the nose daily.  48 g  3  . indomethacin (INDOCIN) 50 MG capsule Take one by mouth every 8 hours as needed for gout pain  30 capsule  1  . ketoconazole (NIZORAL) 200 MG tablet Take 2 tablets by mouth times one dose as needed tinea versicolor       . nicotine (NICOTROL) 10 MG inhaler Use as directed 6-16 inhalations as needed daily, taper as directed       . triamcinolone cream (KENALOG) 0.1 % Apply to affected rash tow times a day as needed  85.2 g  3   No current facility-administered medications for this visit.    Review  of Systems Review of Systems  Constitutional: Negative for fever, chills and unexpected weight change.  HENT: Negative for hearing loss, congestion, sore throat, trouble swallowing and voice change.   Eyes: Negative for visual disturbance.  Respiratory: Negative for cough and wheezing.   Cardiovascular: Negative for chest pain, palpitations and leg swelling.  Gastrointestinal: Negative for nausea, vomiting, abdominal pain, diarrhea, constipation, blood in stool, abdominal distention, anal bleeding and rectal pain.  Genitourinary: Negative for hematuria and difficulty urinating.  Musculoskeletal: Negative for arthralgias.  Skin: Negative for rash and wound.  Neurological: Negative for seizures, syncope, weakness and headaches.  Hematological: Negative for adenopathy. Does not bruise/bleed easily.  Psychiatric/Behavioral: Negative for confusion.    Blood pressure 142/90, pulse 68, temperature 97.8 F (36.6 C), temperature source Temporal, resp. rate 18, height 5\' 11"  (1.803 m), weight 210 lb (95.255 kg).  Physical  Exam Physical Exam  Constitutional: He is oriented to person, place, and time. He appears well-developed and well-nourished. No distress.  HENT:  Head: Normocephalic and atraumatic.  Right Ear: External ear normal.  Left Ear: External ear normal.  Nose: Nose normal.  Mouth/Throat: Oropharynx is clear and moist. No oropharyngeal exudate.  Eyes: Conjunctivae are normal. Pupils are equal, round, and reactive to light. Right eye exhibits no discharge. Left eye exhibits no discharge.  Neck: Normal range of motion. Neck supple. No tracheal deviation present. No thyromegaly present.  Cardiovascular: Normal rate, regular rhythm, normal heart sounds and intact distal pulses.   No murmur heard. Pulmonary/Chest: Effort normal and breath sounds normal. No respiratory distress. He has no wheezes.  Abdominal: Soft. Bowel sounds are normal. He exhibits no distension. There is no tenderness. There is no rebound.  There is a moderate, easily reducible right inguinal hernia. There is also a reducible small umbilical hernia.  Genitourinary:  Large left hydrocele  Musculoskeletal: Normal range of motion. He exhibits no edema and no tenderness.  Lymphadenopathy:    He has no cervical adenopathy.  Neurological: He is alert and oriented to person, place, and time.  Skin: Skin is warm and dry. No rash noted. He is not diaphoretic. No erythema.  Psychiatric: His behavior is normal. Judgment normal.    Data Reviewed   Assessment    1. Right inguinal hernia 2. Umbilical hernia     Plan    Laparoscopic right inguinal hernia with mesh was recommended. This will allow me to evaluate the left side to see whether a hernia is present there as well. I would then fix the umbilical hernia through the small open incision with or without mesh. I may also drained his hydrocele. I discussed the surgery with him in detail. I discussed the risks of surgery which includes but is not limited to bleeding, infection,  recurrence, chronic pain, nerve entrapment etc. He understands and wishes to proceed. Likelihood of success is good        Chriselda Leppert A 06/23/2012, 2:28 PM

## 2012-06-26 ENCOUNTER — Ambulatory Visit (INDEPENDENT_AMBULATORY_CARE_PROVIDER_SITE_OTHER): Payer: Federal, State, Local not specified - PPO | Admitting: Surgery

## 2012-07-11 DIAGNOSIS — K429 Umbilical hernia without obstruction or gangrene: Secondary | ICD-10-CM | POA: Diagnosis not present

## 2012-07-11 DIAGNOSIS — N433 Hydrocele, unspecified: Secondary | ICD-10-CM

## 2012-07-11 DIAGNOSIS — K4020 Bilateral inguinal hernia, without obstruction or gangrene, not specified as recurrent: Secondary | ICD-10-CM

## 2012-07-11 HISTORY — PX: OTHER SURGICAL HISTORY: SHX169

## 2012-07-29 ENCOUNTER — Encounter (INDEPENDENT_AMBULATORY_CARE_PROVIDER_SITE_OTHER): Payer: Self-pay | Admitting: Surgery

## 2012-07-29 ENCOUNTER — Ambulatory Visit (INDEPENDENT_AMBULATORY_CARE_PROVIDER_SITE_OTHER): Payer: Federal, State, Local not specified - PPO | Admitting: Surgery

## 2012-07-29 VITALS — BP 118/78 | HR 46 | Temp 97.8°F | Ht 71.5 in | Wt 212.8 lb

## 2012-07-29 DIAGNOSIS — Z09 Encounter for follow-up examination after completed treatment for conditions other than malignant neoplasm: Secondary | ICD-10-CM

## 2012-07-29 NOTE — Progress Notes (Signed)
Subjective:     Patient ID: Ronald [name], male   DOB: 1946-02-13, 67 y.o.   MRN: [id number]  HPI He is here for his first postop visit status post bilateral laparoscopic inguinal hernia repair with mesh, umbilical hernia Repair and drainage of hydrocele . He is doing very well and has no complaints  Review of Systems     Objective:   Physical Exam    On exam, his incisions are all well healed and there is no evidence of recurrence Assessment:     Patient doing well postop     Plan:     He will refrain from heavy lifting for one more week. He will then resume normal activity. I will see him back as needed

## 2012-08-21 ENCOUNTER — Telehealth: Payer: Self-pay | Admitting: Family Medicine

## 2012-08-21 MED ORDER — AMLODIPINE BESYLATE 5 MG PO TABS: 5.0000 mg | ORAL_TABLET | Freq: Every day | ORAL | Status: DC

## 2012-08-21 NOTE — Telephone Encounter (Signed)
Please call into [facility]

## 2012-08-21 NOTE — Telephone Encounter (Signed)
Patient called stating that he need an rx for 10 days for amlodipine 5 mg 1poqd as he is leaving Sunday for a trip and has not received his mail order rxs yet. Please assist.

## 2012-09-08 ENCOUNTER — Telehealth: Payer: Self-pay | Admitting: Family Medicine

## 2012-09-08 DIAGNOSIS — C61 Malignant neoplasm of prostate: Secondary | ICD-10-CM | POA: Diagnosis not present

## 2012-09-08 NOTE — Telephone Encounter (Signed)
Please advise if okay to refill. 

## 2012-09-08 NOTE — Telephone Encounter (Signed)
PT called to request a refill of his indomethacin (INDOCIN) 50 MG capsule. [facility] [place]. Please assist.

## 2012-09-08 NOTE — Telephone Encounter (Signed)
OK to refill Indomethacin once.

## 2012-09-09 MED ORDER — INDOMETHACIN 50 MG PO CAPS: ORAL_CAPSULE | ORAL | Status: DC

## 2012-09-10 ENCOUNTER — Encounter: Payer: Self-pay | Admitting: Family Medicine

## 2012-09-10 ENCOUNTER — Ambulatory Visit (INDEPENDENT_AMBULATORY_CARE_PROVIDER_SITE_OTHER): Payer: Federal, State, Local not specified - PPO | Admitting: Family Medicine

## 2012-09-10 VITALS — BP 140/90 | HR 113 | Temp 101.2°F | Wt 215.0 lb

## 2012-09-10 DIAGNOSIS — J019 Acute sinusitis, unspecified: Secondary | ICD-10-CM

## 2012-09-10 MED ORDER — AMOXICILLIN-POT CLAVULANATE 875-125 MG PO TABS: 1.0000 | ORAL_TABLET | Freq: Two times a day (BID) | ORAL | Status: AC

## 2012-09-10 NOTE — Telephone Encounter (Signed)
Filled on 09/09/12 and received confirmation of receipt from the pharmacy.

## 2012-09-10 NOTE — Progress Notes (Signed)
  Subjective:    Patient ID: Ronald [name], male    DOB: [birth year], 67 y.o.   MRN: [id number]  HPI Acute visit. One-week history of progressive sinus symptoms. He has increased nasal congestion and last night developed some chills and low-grade fever. Nasal congestion which is greenish in color. Also productive coughing. Has had some bloody nasal discharge off and on. Increased fatigue. Intermittent headache. Taking over-the-counter Sudafed and analgesics with mild relief.  Recent inguinal and umbilical hernia repair which was uneventful. No abdominal pain.  Past Medical History  Diagnosis Date  . Hypertension   . Hyperlipidemia   . ALLERGIC RHINITIS   . Sleep apnea     wears CPAP  . Gout   . Tinea versicolor   . Prostate cancer     Gleason 6, psa 6.63 on 04/21/2012   Past Surgical History  Procedure Laterality Date  . Tonsillectomy    . Spine surgery  2007    lower  . Colonoscopy    . Surgical, transverse colon, polyp  07/05/11    Tubular Adenoma< No High Grade Dysplsia or Malignancy Identified  . Prostate biopsy  05/15/12    Clinically both Lobes  . Hernia repair  07/11/12    BIH and umb hernia repair    reports that he has been smoking Cigarettes.  He has a 55 pack-year smoking history. He does not have any smokeless tobacco history on file. He reports that he drinks about 3.0 ounces of alcohol per week. He reports that he does not use illicit drugs. family history includes Breast cancer in his mothers; Ovarian cancer in his sister; and Stroke in an unspecified family member.  There is no history of Colon cancer, and Esophageal cancer, and Rectal cancer, and Stomach cancer, . No Known Allergies    Review of Systems  Constitutional: Positive for fever, chills and fatigue.  HENT: Positive for congestion, postnasal drip and sinus pressure. Negative for sore throat and neck stiffness.   Respiratory: Positive for cough. Negative for wheezing.   Cardiovascular: Negative for  chest pain.  Neurological: Positive for headaches.       Objective:   Physical Exam  Constitutional: He appears well-developed and well-nourished.  HENT:  Right Ear: External ear normal.  Left Ear: External ear normal.  Mouth/Throat: Oropharynx is clear and moist.  Neck: Neck supple.  Cardiovascular:  Mildly tachycardic with rate around 105 but regular  Pulmonary/Chest: Effort normal and breath sounds normal. No respiratory distress. He has no wheezes. He has no rales.  Musculoskeletal: He exhibits no edema.  Lymphadenopathy:    He has no cervical adenopathy.          Assessment & Plan:  Acute sinusitis. Augmentin 875 mg twice daily. Followup if symptoms persist or worsen

## 2012-09-10 NOTE — Patient Instructions (Addendum)

## 2012-09-16 ENCOUNTER — Ambulatory Visit (INDEPENDENT_AMBULATORY_CARE_PROVIDER_SITE_OTHER): Payer: Federal, State, Local not specified - PPO | Admitting: Pulmonary Disease

## 2012-09-16 ENCOUNTER — Encounter: Payer: Self-pay | Admitting: Pulmonary Disease

## 2012-09-16 VITALS — BP 160/90 | HR 94 | Temp 98.4°F | Ht 70.5 in | Wt 217.0 lb

## 2012-09-16 DIAGNOSIS — G4733 Obstructive sleep apnea (adult) (pediatric): Secondary | ICD-10-CM | POA: Diagnosis not present

## 2012-09-16 NOTE — Patient Instructions (Addendum)
Stay on cpap, and keep up with mask changes and supplies. Consider "gekko" silicon strip to see if it will help your nose.  Work on weight loss followup with me in one year if doing well.

## 2012-09-16 NOTE — Progress Notes (Signed)
  Subjective:    Patient ID: Ronald [name], male    DOB: 08-29-1945, 67 y.o.   MRN: [id number]  HPI The patient comes in today for followup of his obstructive sleep apnea.  He is wearing CPAP compliantly, and feels that he sleeps well with the device.  He is comfortable with his sleep and daytime alertness.  He did ultimately find a mask that fit better for him, but he still occasionally has a crease on the bridge of his nose.   Review of Systems  Constitutional: Negative for unexpected weight change. Fever:  x 1 week ago.  HENT: Positive for congestion, rhinorrhea, sneezing and postnasal drip. Negative for ear pain, nosebleeds, sore throat, trouble swallowing, dental problem and sinus pressure.        X 1 week ago  Eyes: Positive for pain ( pressure from head congestion/sinus infx). Negative for redness and itching.  Respiratory: Positive for cough. Negative for chest tightness, shortness of breath and wheezing.   Cardiovascular: Negative for palpitations and leg swelling.  Gastrointestinal: Negative for nausea and vomiting.  Genitourinary: Negative for dysuria.  Musculoskeletal: Negative for joint swelling.  Skin: Negative for rash.  Neurological: Negative for headaches.  Hematological: Does not bruise/bleed easily.  Psychiatric/Behavioral: Negative for dysphoric mood. The patient is not nervous/anxious.        Objective:   Physical Exam Well-developed male in no acute distress Nose with purulent discharge noted No skin breakdown or pressure necrosis from the CPAP mask.  There is a small crease on the bridge of the nose without loss of skin integrity. Neck without lymphadenopathy or thyromegaly Extremities without edema, cyanosis Alert and oriented, does not appear to be sleepy.  Moves all 4 extremities.       Assessment & Plan:

## 2012-09-16 NOTE — Assessment & Plan Note (Signed)
The patient is doing very well on CPAP overall, he feels that he sleeps well with the device.  He has had adequate daytime alertness.  He denies current meds does still leave an indention on his nose, it is minor and does not hurt.  I have asked him to consider trying a gekko strip to help with this.   I have encouraged him to continue with CPAP, and to work aggressively on weight loss.  He is to followup with me in one year.

## 2012-09-26 DIAGNOSIS — R9389 Abnormal findings on diagnostic imaging of other specified body structures: Secondary | ICD-10-CM | POA: Diagnosis not present

## 2012-09-26 DIAGNOSIS — C61 Malignant neoplasm of prostate: Secondary | ICD-10-CM | POA: Diagnosis not present

## 2012-10-06 ENCOUNTER — Encounter: Payer: Self-pay | Admitting: Family Medicine

## 2012-10-06 ENCOUNTER — Ambulatory Visit (INDEPENDENT_AMBULATORY_CARE_PROVIDER_SITE_OTHER): Payer: Federal, State, Local not specified - PPO | Admitting: Family Medicine

## 2012-10-06 VITALS — BP 132/72 | HR 104 | Temp 98.4°F | Ht 71.0 in | Wt 212.0 lb

## 2012-10-06 DIAGNOSIS — I10 Essential (primary) hypertension: Secondary | ICD-10-CM

## 2012-10-06 DIAGNOSIS — M109 Gout, unspecified: Secondary | ICD-10-CM | POA: Diagnosis not present

## 2012-10-06 MED ORDER — INDOMETHACIN 50 MG PO CAPS: ORAL_CAPSULE | ORAL | Status: DC

## 2012-10-06 MED ORDER — LISINOPRIL 20 MG PO TABS: 20.0000 mg | ORAL_TABLET | Freq: Every day | ORAL | Status: DC

## 2012-10-06 NOTE — Progress Notes (Signed)
  Subjective:    Patient ID: Ronald [name], male    DOB: [birth year], 67 y.o.   MRN: [id number]  HPI Patient seen for medical followup regarding gout and hypertension Currently takes amlodipine 5 mg daily and Vasotec 10/25 mg. Has been on this medication for several years Suspected gout. He's had several flareups over the past couple months mostly involving metatarsophalangeal joints. With each episode indomethacin generally helps. Does consume a fair amount of alcohol and some poor dietary compliance of the past month Recently had flareup involving left MTP joint and dorsum of right foot. He noted warmth and tenderness with mild edema but no redness. Denies any recent injury.  Hypertension has generally been well controlled. No recent dizziness or headaches.  Past Medical History  Diagnosis Date  . Hypertension   . Hyperlipidemia   . ALLERGIC RHINITIS   . Sleep apnea     wears CPAP  . Gout   . Tinea versicolor   . Prostate cancer     Gleason 6, psa 6.63 on 04/21/2012   Past Surgical History  Procedure Laterality Date  . Tonsillectomy    . Spine surgery  2007    lower  . Colonoscopy    . Surgical, transverse colon, polyp  07/05/11    Tubular Adenoma< No High Grade Dysplsia or Malignancy Identified  . Prostate biopsy  05/15/12    Clinically both Lobes  . Hernia repair  07/11/12    BIH and umb hernia repair    reports that he has been smoking Cigarettes.  He has a 55 pack-year smoking history. He does not have any smokeless tobacco history on file. He reports that he drinks about 3.0 ounces of alcohol per week. He reports that he does not use illicit drugs. family history includes Breast cancer in his mothers; Ovarian cancer in his sister; and Stroke in an unspecified family member.  There is no history of Colon cancer, and Esophageal cancer, and Rectal cancer, and Stomach cancer, . No Known Allergies    Review of Systems  Constitutional: Negative for appetite change, fatigue  and unexpected weight change.  Eyes: Negative for visual disturbance.  Respiratory: Negative for cough, chest tightness and shortness of breath.   Cardiovascular: Negative for chest pain, palpitations and leg swelling.  Musculoskeletal: Positive for arthralgias.  Skin: Negative for rash.  Neurological: Negative for dizziness, syncope, weakness, light-headedness and headaches.       Objective:   Physical Exam  Constitutional: He appears well-developed and well-nourished. No distress.  Cardiovascular: Normal rate and regular rhythm.   Pulmonary/Chest: Effort normal and breath sounds normal. No respiratory distress. He has no wheezes. He has no rales.  Musculoskeletal:  Patient has mild edema with mild erythema and minimal tenderness involving the left MTP joint Right foot exam is normal. He has good dorsalis pedis pulses bilaterally  Skin: No rash noted.          Assessment & Plan:  #1 gout.  We'll discontinue HCTZ as he is having more frequent flareups. Discussed other exacerbating factors such as dietary factors/ alcohol. Stop Vasoretic. Start lisinopril 20 mg once daily. Refill Indocin for as needed use. We discussed possible prophylaxis if we're not seeing improvement and decrease in frequency with discontinuation of HCTZ #2 hypertension. Stable. Reassess one month after blood pressure change as above. Continue amlodipine. He'll continue to monitor at home

## 2012-10-06 NOTE — Patient Instructions (Addendum)
Gout  Gout is an inflammatory condition (arthritis) caused by a buildup of uric acid crystals in the joints. Uric acid is a chemical that is normally present in the blood. Under some circumstances, uric acid can form into crystals in your joints. This causes joint redness, soreness, and swelling (inflammation). Repeat attacks are common. Over time, uric acid crystals can form into masses (tophi) near a joint, causing disfigurement. Gout is treatable and often preventable.  CAUSES   The disease begins with elevated levels of uric acid in the blood. Uric acid is produced by your body when it breaks down a naturally found substance called purines. This also happens when you eat certain foods such as meats and fish. Causes of an elevated uric acid level include:   Being passed down from parent to child (heredity).   Diseases that cause increased uric acid production (obesity, psoriasis, some cancers).   Excessive alcohol use.   Diet, especially diets rich in meat and seafood.   Medicines, including certain cancer-fighting drugs (chemotherapy), diuretics, and aspirin.   Chronic kidney disease. The kidneys are no longer able to remove uric acid well.   Problems with metabolism.  Conditions strongly associated with gout include:   Obesity.   High blood pressure.   High cholesterol.   Diabetes.  Not everyone with elevated uric acid levels gets gout. It is not understood why some people get gout and others do not. Surgery, joint injury, and eating too much of certain foods are some of the factors that can lead to gout.  SYMPTOMS    An attack of gout comes on quickly. It causes intense pain with redness, swelling, and warmth in a joint.   Fever can occur.   Often, only one joint is involved. Certain joints are more commonly involved:   Base of the big toe.   Knee.   Ankle.   Wrist.   Finger.  Without treatment, an attack usually goes away in a few days to weeks. Between attacks, you usually will not have  symptoms, which is different from many other forms of arthritis.  DIAGNOSIS   Your caregiver will suspect gout based on your symptoms and exam. Removal of fluid from the joint (arthrocentesis) is done to check for uric acid crystals. Your caregiver will give you a medicine that numbs the area (local anesthetic) and use a needle to remove joint fluid for exam. Gout is confirmed when uric acid crystals are seen in joint fluid, using a special microscope. Sometimes, blood, urine, and X-ray tests are also used.  TREATMENT   There are 2 phases to gout treatment: treating the sudden onset (acute) attack and preventing attacks (prophylaxis).  Treatment of an Acute Attack   Medicines are used. These include anti-inflammatory medicines or steroid medicines.   An injection of steroid medicine into the affected joint is sometimes necessary.   The painful joint is rested. Movement can worsen the arthritis.   You may use warm or cold treatments on painful joints, depending which works best for you.   Discuss the use of coffee, vitamin C, or cherries with your caregiver. These may be helpful treatment options.  Treatment to Prevent Attacks  After the acute attack subsides, your caregiver may advise prophylactic medicine. These medicines either help your kidneys eliminate uric acid from your body or decrease your uric acid production. You may need to stay on these medicines for a very long time.  The early phase of treatment with prophylactic medicine can be associated   with an increase in acute gout attacks. For this reason, during the first few months of treatment, your caregiver may also advise you to take medicines usually used for acute gout treatment. Be sure you understand your caregiver's directions.  You should also discuss dietary treatment with your caregiver. Certain foods such as meats and fish can increase uric acid levels. Other foods such as dairy can decrease levels. Your caregiver can give you a list of foods  to avoid.  HOME CARE INSTRUCTIONS    Do not take aspirin to relieve pain. This raises uric acid levels.   Only take over-the-counter or prescription medicines for pain, discomfort, or fever as directed by your caregiver.   Rest the joint as much as possible. When in bed, keep sheets and blankets off painful areas.   Keep the affected joint raised (elevated).   Use crutches if the painful joint is in your leg.   Drink enough water and fluids to keep your urine clear or pale yellow. This helps your body get rid of uric acid. Do not drink alcoholic beverages. They slow the passage of uric acid.   Follow your caregiver's dietary instructions. Pay careful attention to the amount of protein you eat. Your daily diet should emphasize fruits, vegetables, whole grains, and fat-free or low-fat milk products.   Maintain a healthy body weight.  SEEK MEDICAL CARE IF:    You have an oral temperature above 102 F (38.9 C).   You develop diarrhea, vomiting, or any side effects from medicines.   You do not feel better in 24 hours, or you are getting worse.  SEEK IMMEDIATE MEDICAL CARE IF:    Your joint becomes suddenly more tender and you have:   Chills.   An oral temperature above 102 F (38.9 C), not controlled by medicine.  MAKE SURE YOU:    Understand these instructions.   Will watch your condition.   Will get help right away if you are not doing well or get worse.  Document Released: 03/23/2000 Document Revised: 06/18/2011 Document Reviewed: 07/04/2009  [employer] Patient Information 2014 [employer].

## 2012-10-08 ENCOUNTER — Encounter: Payer: Self-pay | Admitting: *Deleted

## 2012-12-04 DIAGNOSIS — C61 Malignant neoplasm of prostate: Secondary | ICD-10-CM | POA: Diagnosis not present

## 2012-12-11 DIAGNOSIS — C61 Malignant neoplasm of prostate: Secondary | ICD-10-CM | POA: Diagnosis not present

## 2013-02-12 ENCOUNTER — Other Ambulatory Visit: Payer: Self-pay

## 2013-04-01 DIAGNOSIS — M76899 Other specified enthesopathies of unspecified lower limb, excluding foot: Secondary | ICD-10-CM | POA: Diagnosis not present

## 2013-04-01 DIAGNOSIS — M5137 Other intervertebral disc degeneration, lumbosacral region: Secondary | ICD-10-CM | POA: Diagnosis not present

## 2013-04-01 DIAGNOSIS — IMO0001 Reserved for inherently not codable concepts without codable children: Secondary | ICD-10-CM | POA: Diagnosis not present

## 2013-04-13 ENCOUNTER — Telehealth: Payer: Self-pay | Admitting: Family Medicine

## 2013-04-13 MED ORDER — LISINOPRIL 20 MG PO TABS: 20.0000 mg | ORAL_TABLET | Freq: Every day | ORAL | Status: DC

## 2013-04-13 MED ORDER — AMLODIPINE BESYLATE 5 MG PO TABS: 5.0000 mg | ORAL_TABLET | Freq: Every day | ORAL | Status: DC

## 2013-04-13 MED ORDER — ATORVASTATIN CALCIUM 10 MG PO TABS: 10.0000 mg | ORAL_TABLET | Freq: Every day | ORAL | Status: DC

## 2013-04-13 NOTE — Telephone Encounter (Signed)
Rx's sent to pharmacy.  

## 2013-04-13 NOTE — Telephone Encounter (Signed)
Requesting refill of:    amLODipine (NORVASC) 5 MG tablet  atorvastatin (LIPITOR) 10 MG tablet  lisinopril (PRINIVIL,ZESTRIL) 20 MG tablet  PLEASE NOTE PT HAS REQUESTED TO SWITCH FROM [facility] TO LOCAL PHARMACY [facility].  HE WILL NO LONGER BE USING [facility]

## 2013-04-17 ENCOUNTER — Other Ambulatory Visit: Payer: Self-pay

## 2013-04-17 ENCOUNTER — Ambulatory Visit (INDEPENDENT_AMBULATORY_CARE_PROVIDER_SITE_OTHER): Payer: Federal, State, Local not specified - PPO | Admitting: Family Medicine

## 2013-04-17 ENCOUNTER — Encounter: Payer: Self-pay | Admitting: Family Medicine

## 2013-04-17 VITALS — BP 134/72 | HR 83 | Temp 98.4°F | Wt 224.0 lb

## 2013-04-17 DIAGNOSIS — R7309 Other abnormal glucose: Secondary | ICD-10-CM | POA: Diagnosis not present

## 2013-04-17 DIAGNOSIS — Z23 Encounter for immunization: Secondary | ICD-10-CM | POA: Diagnosis not present

## 2013-04-17 DIAGNOSIS — I10 Essential (primary) hypertension: Secondary | ICD-10-CM

## 2013-04-17 DIAGNOSIS — E785 Hyperlipidemia, unspecified: Secondary | ICD-10-CM | POA: Diagnosis not present

## 2013-04-17 DIAGNOSIS — C61 Malignant neoplasm of prostate: Secondary | ICD-10-CM

## 2013-04-17 DIAGNOSIS — E669 Obesity, unspecified: Secondary | ICD-10-CM | POA: Insufficient documentation

## 2013-04-17 DIAGNOSIS — R7303 Prediabetes: Secondary | ICD-10-CM

## 2013-04-17 LAB — HEMOGLOBIN A1C: Hgb A1c MFr Bld: 6.4 % (ref 4.6–6.5)

## 2013-04-17 LAB — LIPID PANEL
CHOLESTEROL: 146 mg/dL (ref 0–200)
HDL: 45.8 mg/dL (ref >39.00)
LDL Cholesterol: 84 mg/dL (ref 0–99)
Total CHOL/HDL Ratio: 3
Triglycerides: 83 mg/dL (ref 0.0–149.0)
VLDL: 16.6 mg/dL (ref 0.0–40.0)

## 2013-04-17 LAB — HEPATIC FUNCTION PANEL
ALT: 23 U/L (ref 0–53)
AST: 18 U/L (ref 0–37)
Albumin: 4.2 g/dL (ref 3.5–5.2)
Alkaline Phosphatase: 76 U/L (ref 39–117)
BILIRUBIN TOTAL: 0.9 mg/dL (ref 0.3–1.2)
Bilirubin, Direct: 0.2 mg/dL (ref 0.0–0.3)
Total Protein: 6.6 g/dL (ref 6.0–8.3)

## 2013-04-17 LAB — BASIC METABOLIC PANEL
BUN: 17 mg/dL (ref 6–23)
CALCIUM: 9.1 mg/dL (ref 8.4–10.5)
CO2: 28 mEq/L (ref 19–32)
CREATININE: 0.9 mg/dL (ref 0.4–1.5)
Chloride: 104 mEq/L (ref 96–112)
GFR: 91.56 mL/min (ref >60.00)
Glucose, Bld: 112 mg/dL — ABNORMAL HIGH (ref 70–99)
POTASSIUM: 4.1 meq/L (ref 3.5–5.1)
Sodium: 139 mEq/L (ref 135–145)

## 2013-04-17 LAB — PSA: PSA: 5.83 ng/mL — ABNORMAL HIGH (ref 0.10–4.00)

## 2013-04-17 MED ORDER — AMLODIPINE BESYLATE 5 MG PO TABS: 5.0000 mg | ORAL_TABLET | Freq: Every day | ORAL | Status: DC

## 2013-04-17 MED ORDER — INDOMETHACIN 50 MG PO CAPS: ORAL_CAPSULE | ORAL | Status: DC

## 2013-04-17 MED ORDER — ATORVASTATIN CALCIUM 10 MG PO TABS: 10.0000 mg | ORAL_TABLET | Freq: Every day | ORAL | Status: DC

## 2013-04-17 NOTE — Patient Instructions (Signed)
Try to lose some weight 

## 2013-04-17 NOTE — Progress Notes (Signed)
   Subjective:    Patient ID: Ronald [name], male    DOB: February 02, 1946, 69 y.o.   MRN: [id number]  HPI Patient seen for medical follow up He has history of hypertension, dyslipidemia, metabolic syndrome, gout, prostate cancer which was just diagnosed last year. Been followed by urology. Asymptomatic with regards to prostate. He is requesting repeat PSA today. They're just observing at this time.  He is prediabetes. No polyuria or polydipsia. He has gained some weight since last visit. Poor compliance with diet and exercise. He's had some intermittent back and hip difficulties which have limited his activities.  We discontinued lisinopril HCTZ several months ago because of his gout history and blood pressures remained controlled on amlodipine and plain lisinopril.  Past Medical History  Diagnosis Date  . Hypertension   . Hyperlipidemia   . ALLERGIC RHINITIS   . Sleep apnea     wears CPAP  . Gout   . Tinea versicolor   . Prostate cancer     Gleason 6, psa 6.63 on 04/21/2012   Past Surgical History  Procedure Laterality Date  . Tonsillectomy    . Spine surgery  2007    lower  . Colonoscopy    . Surgical, transverse colon, polyp  07/05/11    Tubular Adenoma< No High Grade Dysplsia or Malignancy Identified  . Prostate biopsy  05/15/12    Clinically both Lobes  . Hernia repair  07/11/12    BIH and umb hernia repair    reports that he has been smoking Cigarettes.  He has a 55 pack-year smoking history. He does not have any smokeless tobacco history on file. He reports that he drinks about 3.0 ounces of alcohol per week. He reports that he does not use illicit drugs. family history includes Breast cancer in his mother and mother; Ovarian cancer in his sister; Stroke in an other family member. There is no history of Colon cancer, Esophageal cancer, Rectal cancer, or Stomach cancer. No Known Allergies    Review of Systems  Constitutional: Negative for fatigue.  Eyes: Negative for  visual disturbance.  Respiratory: Negative for cough, chest tightness and shortness of breath.   Cardiovascular: Negative for chest pain, palpitations and leg swelling.  Endocrine: Negative for polydipsia and polyuria.  Neurological: Negative for dizziness, syncope, weakness, light-headedness and headaches.       Objective:   Physical Exam  Constitutional: He appears well-developed and well-nourished.  Neck: Neck supple. No thyromegaly present.  Cardiovascular: Normal rate.   Pulmonary/Chest: Effort normal and breath sounds normal. No respiratory distress. He has no wheezes. He has no rales.  Musculoskeletal: He exhibits no edema.          Assessment & Plan:  #1 hypertension. Adequately controlled. Continue to avoid HCTZ with gout history #2 history of elevated PSA with prostate biopsy proven adenocarcinoma. Followed by urology in stable. Patient requesting repeat PSA today #3 hyperlipidemia. Repeat lipid panel and hepatic panel. Continue Lipitor #4 history prediabetes. He is strongly advised to lose some weight. Recheck fasting basic metabolic panel and hemoglobin A1c

## 2013-04-17 NOTE — Progress Notes (Signed)
Pre visit review using our clinic review tool, if applicable. No additional management support is needed unless otherwise documented below in the visit note. 

## 2013-04-20 ENCOUNTER — Encounter: Payer: Self-pay | Admitting: Family Medicine

## 2013-06-08 DIAGNOSIS — C61 Malignant neoplasm of prostate: Secondary | ICD-10-CM | POA: Diagnosis not present

## 2013-06-16 DIAGNOSIS — C61 Malignant neoplasm of prostate: Secondary | ICD-10-CM | POA: Diagnosis not present

## 2013-07-06 DIAGNOSIS — C61 Malignant neoplasm of prostate: Secondary | ICD-10-CM | POA: Diagnosis not present

## 2013-07-20 ENCOUNTER — Telehealth: Payer: Self-pay | Admitting: Family Medicine

## 2013-07-20 MED ORDER — TRIAMCINOLONE ACETONIDE 0.1 % EX CREA: TOPICAL_CREAM | CUTANEOUS | Status: DC

## 2013-07-20 NOTE — Telephone Encounter (Signed)
[id number] [place], [place] - [address] is requesting re-fill on triamcinolone cream (KENALOG) 0.1 %

## 2013-07-20 NOTE — Telephone Encounter (Signed)
RX sent to pharmacy  

## 2013-07-27 DIAGNOSIS — C61 Malignant neoplasm of prostate: Secondary | ICD-10-CM | POA: Diagnosis not present

## 2013-09-14 ENCOUNTER — Encounter: Payer: Self-pay | Admitting: Pulmonary Disease

## 2013-09-14 ENCOUNTER — Ambulatory Visit (INDEPENDENT_AMBULATORY_CARE_PROVIDER_SITE_OTHER): Payer: Federal, State, Local not specified - PPO | Admitting: Pulmonary Disease

## 2013-09-14 VITALS — BP 130/88 | HR 85 | Temp 98.4°F | Ht 71.5 in | Wt 210.0 lb

## 2013-09-14 DIAGNOSIS — G4733 Obstructive sleep apnea (adult) (pediatric): Secondary | ICD-10-CM

## 2013-09-14 NOTE — Assessment & Plan Note (Signed)
The patient continues to do well with his CPAP, and has no issues with his mask or pressure. He feels that he is sleeping well and is satisfied with his daytime alertness. I've asked him to keep up with his mask changes and supplies, and to continue working on weight loss.

## 2013-09-14 NOTE — Patient Instructions (Signed)
Continue on cpap, and keep up with the mask changes and supplies. Work on weight loss followup with me again in one year.

## 2013-09-14 NOTE — Progress Notes (Signed)
   Subjective:    Patient ID: Ronald [name], male    DOB: 1945/04/20, 68 y.o.   MRN: [id number]  HPI Patient comes in today for followup of his obstructive sleep apnea. He is wearing CPAP compliantly, and is having no issues with his mask fit or pressure. He sleeps well with the device, and has excellent daytime alertness. Of note, the patient's weight is down 7 pounds since last visit.   Review of Systems  Constitutional: Negative for fever and unexpected weight change.  HENT: Negative for congestion, dental problem, ear pain, nosebleeds, postnasal drip, rhinorrhea, sinus pressure, sneezing, sore throat and trouble swallowing.   Eyes: Negative for redness and itching.  Respiratory: Negative for cough, chest tightness, shortness of breath and wheezing.   Cardiovascular: Negative for palpitations and leg swelling.  Gastrointestinal: Negative for nausea and vomiting.  Genitourinary: Negative for dysuria.  Musculoskeletal: Negative for joint swelling.  Skin: Negative for rash.  Neurological: Negative for headaches.  Hematological: Does not bruise/bleed easily.  Psychiatric/Behavioral: Negative for dysphoric mood. The patient is not nervous/anxious.        Objective:   Physical Exam Overweight male in no acute distress Nose without purulence or discharge noted  No skin breakdown or pressure necrosis from the CPAP mask Neck without lymphadenopathy or thyromegaly Lower extremities without edema, no cyanosis Alert and oriented, moves all 4 extremities.       Assessment & Plan:

## 2013-10-15 ENCOUNTER — Encounter: Payer: Self-pay | Admitting: Family Medicine

## 2013-10-15 ENCOUNTER — Ambulatory Visit (INDEPENDENT_AMBULATORY_CARE_PROVIDER_SITE_OTHER): Payer: Federal, State, Local not specified - PPO | Admitting: Family Medicine

## 2013-10-15 VITALS — BP 132/80 | HR 81 | Temp 98.4°F | Wt 208.0 lb

## 2013-10-15 DIAGNOSIS — Z23 Encounter for immunization: Secondary | ICD-10-CM

## 2013-10-15 DIAGNOSIS — E785 Hyperlipidemia, unspecified: Secondary | ICD-10-CM

## 2013-10-15 DIAGNOSIS — C61 Malignant neoplasm of prostate: Secondary | ICD-10-CM

## 2013-10-15 DIAGNOSIS — E8881 Metabolic syndrome: Secondary | ICD-10-CM

## 2013-10-15 DIAGNOSIS — I10 Essential (primary) hypertension: Secondary | ICD-10-CM

## 2013-10-15 NOTE — Progress Notes (Signed)
Subjective:    Patient ID: Ronald [name], male    DOB: Feb 25, 1946, 68 y.o.   MRN: [id number]  HPI Medical followup.  Excellent job with weight loss since last visit. Has lost about 16 pounds. He has done is mostly through reduced portion sizes. Has eliminated fast food. Overall feels well. Blood pressures been stable. No orthostasis.  Hyperlipidemia treated with Lipitor. Lipids were at goal last visit. No recent gout flareups. He is conscious is staying well-hydrated.  Prostate cancer followed by urologist at [facility]. There observe him right now with no active treatment  He had recent hernia repair. He has large left-sided hydrocele. Asymptomatic. He has not yet discussed with urologist but is considering whether he would like to have this repaired.  Past Medical History  Diagnosis Date  . Hypertension   . Hyperlipidemia   . ALLERGIC RHINITIS   . Sleep apnea     wears CPAP  . Gout   . Tinea versicolor   . Prostate cancer     Gleason 6, psa 6.63 on 04/21/2012   Past Surgical History  Procedure Laterality Date  . Tonsillectomy    . Spine surgery  2007    lower  . Colonoscopy    . Surgical, transverse colon, polyp  07/05/11    Tubular Adenoma< No High Grade Dysplsia or Malignancy Identified  . Prostate biopsy  05/15/12    Clinically both Lobes  . Hernia repair  07/11/12    BIH and umb hernia repair    reports that he has been smoking Cigarettes.  He has a 55 pack-year smoking history. He does not have any smokeless tobacco history on file. He reports that he drinks about 3 ounces of alcohol per week. He reports that he does not use illicit drugs. family history includes Breast cancer in his mother and mother; Ovarian cancer in his sister; Stroke in an other family member. There is no history of Colon cancer, Esophageal cancer, Rectal cancer, or Stomach cancer. No Known Allergies    Review of Systems  Constitutional: Negative for appetite change, fatigue and  unexpected weight change.  Eyes: Negative for visual disturbance.  Respiratory: Negative for cough, chest tightness and shortness of breath.   Cardiovascular: Negative for chest pain, palpitations and leg swelling.  Neurological: Negative for dizziness, syncope, weakness, light-headedness and headaches.       Objective:   Physical Exam  Constitutional: He is oriented to person, place, and time. He appears well-developed and well-nourished. No distress.  HENT:  Head: Normocephalic and atraumatic.  Eyes: Conjunctivae are normal. Pupils are equal, round, and reactive to light.  Neck: Normal range of motion. Neck supple. No thyromegaly present.  Cardiovascular: Normal rate, regular rhythm and normal heart sounds.   No murmur heard. Pulmonary/Chest: No respiratory distress. He has no wheezes. He has no rales.  Musculoskeletal: He exhibits no edema.  Lymphadenopathy:    He has no cervical adenopathy.  Neurological: He is alert and oriented to person, place, and time. He displays normal reflexes. No cranial nerve deficit.  Skin: No rash noted.  Psychiatric: He has a normal mood and affect.          Assessment & Plan:  #1 hypertension. Stable. Continue weight loss efforts #2 prostate cancer followed at [facility]. #3 hyperlipidemia. This is been well controlled and suspect will continue to improve with weight loss efforts. He also is history prediabetes which is likely improved as well with 16 pound weight loss #4 health  maintenance. Prevnar 13 given. #5 gout which has been stable. If he starts having more frequent flareups the future, consider change of lisinopril to losartan

## 2013-10-15 NOTE — Progress Notes (Signed)
Pre visit review using our clinic review tool, if applicable. No additional management support is needed unless otherwise documented below in the visit note. 

## 2013-10-16 ENCOUNTER — Telehealth: Payer: Self-pay | Admitting: Family Medicine

## 2013-10-16 NOTE — Telephone Encounter (Signed)
Relevant patient education assigned to patient using Emmi. ° °

## 2013-11-03 ENCOUNTER — Telehealth: Payer: Self-pay | Admitting: Family Medicine

## 2013-11-03 MED ORDER — AMLODIPINE BESYLATE 5 MG PO TABS: 5.0000 mg | ORAL_TABLET | Freq: Every day | ORAL | Status: DC

## 2013-11-03 MED ORDER — ATORVASTATIN CALCIUM 10 MG PO TABS: 10.0000 mg | ORAL_TABLET | Freq: Every day | ORAL | Status: DC

## 2013-11-03 MED ORDER — LISINOPRIL 20 MG PO TABS: 20.0000 mg | ORAL_TABLET | Freq: Every day | ORAL | Status: DC

## 2013-11-03 NOTE — Telephone Encounter (Signed)
RX's sent to pharmacy 

## 2013-11-03 NOTE — Telephone Encounter (Signed)
lisinopril (PRINIVIL,ZESTRIL) 20 MG tablet  90 day w/ refill s  amLODipine (NORVASC) 5 MG tablet 90 day w/ refilsl atorvastatin (LIPITOR) 10 MG tablet  90 day w/ refills [facility] [place]

## 2013-12-08 ENCOUNTER — Encounter: Payer: Self-pay | Admitting: Gastroenterology

## 2014-02-12 ENCOUNTER — Telehealth: Payer: Self-pay | Admitting: Pulmonary Disease

## 2014-02-12 NOTE — Telephone Encounter (Signed)
Called and spoke with pt and he stated that he is in [place] and he has forgotten the power cord to his CPAP.  i called the local [facility] and they stated that they do not keep the cords in stock and this would have to be ordered.  She did give me the # to the [facility] in [place], [place] and i did pass this number along to the pt to see if they may have one in stock.  Pt stated that he will call and  See if he can get one.

## 2014-02-24 DIAGNOSIS — C61 Malignant neoplasm of prostate: Secondary | ICD-10-CM | POA: Diagnosis not present

## 2014-04-22 ENCOUNTER — Encounter: Payer: Self-pay | Admitting: Family Medicine

## 2014-04-22 ENCOUNTER — Ambulatory Visit (INDEPENDENT_AMBULATORY_CARE_PROVIDER_SITE_OTHER): Payer: Federal, State, Local not specified - PPO | Admitting: Family Medicine

## 2014-04-22 VITALS — BP 130/80 | HR 104 | Temp 98.1°F | Wt 225.0 lb

## 2014-04-22 DIAGNOSIS — J018 Other acute sinusitis: Secondary | ICD-10-CM

## 2014-04-22 MED ORDER — AMOXICILLIN-POT CLAVULANATE 875-125 MG PO TABS: 1.0000 | ORAL_TABLET | Freq: Two times a day (BID) | ORAL | Status: DC

## 2014-04-22 NOTE — Patient Instructions (Signed)

## 2014-04-22 NOTE — Progress Notes (Signed)
Pre visit review using our clinic review tool, if applicable. No additional management support is needed unless otherwise documented below in the visit note. 

## 2014-04-22 NOTE — Progress Notes (Signed)
   Subjective:    Patient ID: Ronald [name], male    DOB: 1945-12-20, 69 y.o.   MRN: [id number]  HPI Acute visit. Patient seen with onset around December of sinus congestion. He took some Allegra and Flonase initially. No improvement. He's had frequent sinus pressure maxillary and ethmoid as well as frontal region. No headaches. No fever. Increased malaise and body aches. Occasional dry cough. He has had greenish and bloody nasal discharge. No relief with over-the-counter medications  Past Medical History  Diagnosis Date  . Hypertension   . Hyperlipidemia   . ALLERGIC RHINITIS   . Sleep apnea     wears CPAP  . Gout   . Tinea versicolor   . Prostate cancer     Gleason 6, psa 6.63 on 04/21/2012   Past Surgical History  Procedure Laterality Date  . Tonsillectomy    . Spine surgery  2007    lower  . Colonoscopy    . Surgical, transverse colon, polyp  07/05/11    Tubular Adenoma< No High Grade Dysplsia or Malignancy Identified  . Prostate biopsy  05/15/12    Clinically both Lobes  . Hernia repair  07/11/12    BIH and umb hernia repair    reports that he has been smoking Cigarettes.  He has a 55 pack-year smoking history. He does not have any smokeless tobacco history on file. He reports that he drinks about 3.0 oz of alcohol per week. He reports that he does not use illicit drugs. family history includes Breast cancer in his mother and mother; Ovarian cancer in his sister; Stroke in an other family member. There is no history of Colon cancer, Esophageal cancer, Rectal cancer, or Stomach cancer. No Known Allergies    Review of Systems  Constitutional: Positive for fatigue. Negative for fever and chills.  HENT: Positive for congestion and sinus pressure.   Respiratory: Positive for cough.   Neurological: Positive for headaches.       Objective:   Physical Exam  Constitutional: He appears well-developed and well-nourished.  HENT:  Right Ear: External ear normal.  Left Ear:  External ear normal.  Mouth/Throat: Oropharynx is clear and moist.  Erythematous nasal mucosa bilaterally with thick yellow mucus bilaterally  Neck: Neck supple.  Cardiovascular: Normal rate and regular rhythm.   Pulmonary/Chest: Effort normal and breath sounds normal.  Slightly diminished breath sounds throughout but no wheezes or rales  Lymphadenopathy:    He has no cervical adenopathy.          Assessment & Plan:  Acute sinusitis. He has likely pansinusitis. Augmentin 875 mg twice daily for 10 days

## 2014-04-23 ENCOUNTER — Telehealth: Payer: Self-pay | Admitting: Family Medicine

## 2014-04-23 NOTE — Telephone Encounter (Signed)
emmi emailed °

## 2014-04-26 DIAGNOSIS — N433 Hydrocele, unspecified: Secondary | ICD-10-CM | POA: Diagnosis not present

## 2014-04-27 ENCOUNTER — Other Ambulatory Visit: Payer: Self-pay | Admitting: Urology

## 2014-04-28 ENCOUNTER — Encounter (HOSPITAL_BASED_OUTPATIENT_CLINIC_OR_DEPARTMENT_OTHER): Payer: Self-pay | Admitting: *Deleted

## 2014-04-29 ENCOUNTER — Encounter (HOSPITAL_BASED_OUTPATIENT_CLINIC_OR_DEPARTMENT_OTHER): Payer: Self-pay | Admitting: *Deleted

## 2014-04-29 NOTE — Progress Notes (Signed)
To [facility] at 0715-Instructed Npo after Mn-refrain from smoking-Istat,Ekg on arrival

## 2014-05-04 ENCOUNTER — Ambulatory Visit (HOSPITAL_BASED_OUTPATIENT_CLINIC_OR_DEPARTMENT_OTHER): Payer: Federal, State, Local not specified - PPO | Admitting: Anesthesiology

## 2014-05-04 ENCOUNTER — Encounter (HOSPITAL_BASED_OUTPATIENT_CLINIC_OR_DEPARTMENT_OTHER): Payer: Self-pay | Admitting: *Deleted

## 2014-05-04 ENCOUNTER — Inpatient Hospital Stay (HOSPITAL_COMMUNITY)
Admission: EM | Admit: 2014-05-04 | Discharge: 2014-05-08 | DRG: 310 | Disposition: A | Discharge status: Home / Self Care | Payer: Federal, State, Local not specified - PPO | Attending: Internal Medicine | Admitting: Internal Medicine

## 2014-05-04 ENCOUNTER — Encounter (HOSPITAL_COMMUNITY): Payer: Self-pay | Admitting: *Deleted

## 2014-05-04 ENCOUNTER — Encounter (HOSPITAL_BASED_OUTPATIENT_CLINIC_OR_DEPARTMENT_OTHER): Admission: RE | Payer: Self-pay | Source: Ambulatory Visit | Attending: Urology

## 2014-05-04 ENCOUNTER — Ambulatory Visit (HOSPITAL_BASED_OUTPATIENT_CLINIC_OR_DEPARTMENT_OTHER)
Admission: RE | Admit: 2014-05-04 | Discharge: 2014-05-04 | Payer: Federal, State, Local not specified - PPO | Source: Ambulatory Visit | Attending: Urology | Admitting: Urology

## 2014-05-04 ENCOUNTER — Emergency Department (HOSPITAL_COMMUNITY): Payer: Federal, State, Local not specified - PPO

## 2014-05-04 ENCOUNTER — Other Ambulatory Visit: Payer: Self-pay

## 2014-05-04 DIAGNOSIS — R0989 Other specified symptoms and signs involving the circulatory and respiratory systems: Secondary | ICD-10-CM | POA: Diagnosis not present

## 2014-05-04 DIAGNOSIS — C61 Malignant neoplasm of prostate: Secondary | ICD-10-CM | POA: Diagnosis not present

## 2014-05-04 DIAGNOSIS — N433 Hydrocele, unspecified: Secondary | ICD-10-CM | POA: Insufficient documentation

## 2014-05-04 DIAGNOSIS — G4733 Obstructive sleep apnea (adult) (pediatric): Secondary | ICD-10-CM | POA: Diagnosis present

## 2014-05-04 DIAGNOSIS — I10 Essential (primary) hypertension: Secondary | ICD-10-CM | POA: Diagnosis present

## 2014-05-04 DIAGNOSIS — Z79899 Other long term (current) drug therapy: Secondary | ICD-10-CM | POA: Insufficient documentation

## 2014-05-04 DIAGNOSIS — E8881 Metabolic syndrome: Secondary | ICD-10-CM

## 2014-05-04 DIAGNOSIS — R634 Abnormal weight loss: Secondary | ICD-10-CM | POA: Diagnosis present

## 2014-05-04 DIAGNOSIS — B358 Other dermatophytoses: Secondary | ICD-10-CM | POA: Insufficient documentation

## 2014-05-04 DIAGNOSIS — F17210 Nicotine dependence, cigarettes, uncomplicated: Secondary | ICD-10-CM | POA: Diagnosis present

## 2014-05-04 DIAGNOSIS — Z8546 Personal history of malignant neoplasm of prostate: Secondary | ICD-10-CM

## 2014-05-04 DIAGNOSIS — Z86010 Personal history of colonic polyps: Secondary | ICD-10-CM

## 2014-05-04 DIAGNOSIS — R531 Weakness: Secondary | ICD-10-CM | POA: Diagnosis not present

## 2014-05-04 DIAGNOSIS — Z5309 Procedure and treatment not carried out because of other contraindication: Secondary | ICD-10-CM

## 2014-05-04 DIAGNOSIS — Z7982 Long term (current) use of aspirin: Secondary | ICD-10-CM

## 2014-05-04 DIAGNOSIS — M109 Gout, unspecified: Secondary | ICD-10-CM | POA: Diagnosis present

## 2014-05-04 DIAGNOSIS — Z720 Tobacco use: Secondary | ICD-10-CM | POA: Insufficient documentation

## 2014-05-04 DIAGNOSIS — Z7951 Long term (current) use of inhaled steroids: Secondary | ICD-10-CM

## 2014-05-04 DIAGNOSIS — R451 Restlessness and agitation: Secondary | ICD-10-CM | POA: Diagnosis not present

## 2014-05-04 DIAGNOSIS — Z7952 Long term (current) use of systemic steroids: Secondary | ICD-10-CM

## 2014-05-04 DIAGNOSIS — E669 Obesity, unspecified: Secondary | ICD-10-CM | POA: Diagnosis present

## 2014-05-04 DIAGNOSIS — R7309 Other abnormal glucose: Secondary | ICD-10-CM

## 2014-05-04 DIAGNOSIS — R0602 Shortness of breath: Secondary | ICD-10-CM | POA: Diagnosis present

## 2014-05-04 DIAGNOSIS — E1165 Type 2 diabetes mellitus with hyperglycemia: Secondary | ICD-10-CM | POA: Diagnosis present

## 2014-05-04 DIAGNOSIS — Z803 Family history of malignant neoplasm of breast: Secondary | ICD-10-CM | POA: Diagnosis not present

## 2014-05-04 DIAGNOSIS — E785 Hyperlipidemia, unspecified: Secondary | ICD-10-CM | POA: Insufficient documentation

## 2014-05-04 DIAGNOSIS — R000 Tachycardia, unspecified: Secondary | ICD-10-CM | POA: Diagnosis not present

## 2014-05-04 DIAGNOSIS — J309 Allergic rhinitis, unspecified: Secondary | ICD-10-CM

## 2014-05-04 DIAGNOSIS — I493 Ventricular premature depolarization: Secondary | ICD-10-CM | POA: Diagnosis not present

## 2014-05-04 DIAGNOSIS — Z823 Family history of stroke: Secondary | ICD-10-CM

## 2014-05-04 DIAGNOSIS — Z792 Long term (current) use of antibiotics: Secondary | ICD-10-CM

## 2014-05-04 DIAGNOSIS — I517 Cardiomegaly: Secondary | ICD-10-CM | POA: Diagnosis present

## 2014-05-04 DIAGNOSIS — I471 Supraventricular tachycardia: Secondary | ICD-10-CM

## 2014-05-04 DIAGNOSIS — J90 Pleural effusion, not elsewhere classified: Secondary | ICD-10-CM | POA: Diagnosis not present

## 2014-05-04 DIAGNOSIS — Z8041 Family history of malignant neoplasm of ovary: Secondary | ICD-10-CM | POA: Diagnosis not present

## 2014-05-04 DIAGNOSIS — I4891 Unspecified atrial fibrillation: Secondary | ICD-10-CM | POA: Diagnosis present

## 2014-05-04 DIAGNOSIS — E876 Hypokalemia: Secondary | ICD-10-CM | POA: Diagnosis not present

## 2014-05-04 DIAGNOSIS — I483 Typical atrial flutter: Secondary | ICD-10-CM | POA: Diagnosis not present

## 2014-05-04 DIAGNOSIS — I4892 Unspecified atrial flutter: Secondary | ICD-10-CM | POA: Diagnosis not present

## 2014-05-04 DIAGNOSIS — I480 Paroxysmal atrial fibrillation: Secondary | ICD-10-CM | POA: Diagnosis present

## 2014-05-04 DIAGNOSIS — Z6831 Body mass index (BMI) 31.0-31.9, adult: Secondary | ICD-10-CM

## 2014-05-04 DIAGNOSIS — F29 Unspecified psychosis not due to a substance or known physiological condition: Secondary | ICD-10-CM | POA: Diagnosis not present

## 2014-05-04 HISTORY — DX: Allergic rhinitis, unspecified: J30.9

## 2014-05-04 HISTORY — DX: Obstructive sleep apnea (adult) (pediatric): G47.33

## 2014-05-04 HISTORY — DX: Personal history of colon polyps, unspecified: Z86.0100

## 2014-05-04 HISTORY — DX: Obstructive sleep apnea (adult) (pediatric): Z99.89

## 2014-05-04 HISTORY — DX: Personal history of colonic polyps: Z86.010

## 2014-05-04 HISTORY — DX: Diverticulosis of large intestine without perforation or abscess without bleeding: K57.30

## 2014-05-04 LAB — POCT I-STAT 4, (NA,K, GLUC, HGB,HCT)
Glucose, Bld: 112 mg/dL — ABNORMAL HIGH (ref 70–99)
HCT: 45 % (ref 39.0–52.0)
HEMOGLOBIN: 15.3 g/dL (ref 13.0–17.0)
POTASSIUM: 4.1 mmol/L (ref 3.5–5.1)
SODIUM: 142 mmol/L (ref 135–145)

## 2014-05-04 LAB — CBC WITH DIFFERENTIAL/PLATELET
Basophils Absolute: 0 10*3/uL (ref 0.0–0.1)
Basophils Relative: 1 % (ref 0–1)
Eosinophils Absolute: 0.2 10*3/uL (ref 0.0–0.7)
Eosinophils Relative: 3 % (ref 0–5)
HCT: 43.9 % (ref 39.0–52.0)
Hemoglobin: 14.7 g/dL (ref 13.0–17.0)
Lymphocytes Relative: 23 % (ref 12–46)
Lymphs Abs: 1.5 10*3/uL (ref 0.7–4.0)
MCH: 33.6 pg (ref 26.0–34.0)
MCHC: 33.5 g/dL (ref 30.0–36.0)
MCV: 100.2 fL — ABNORMAL HIGH (ref 78.0–100.0)
Monocytes Absolute: 0.5 10*3/uL (ref 0.1–1.0)
Monocytes Relative: 7 % (ref 3–12)
Neutro Abs: 4.4 10*3/uL (ref 1.7–7.7)
Neutrophils Relative %: 68 % (ref 43–77)
Platelets: 208 10*3/uL (ref 150–400)
RBC: 4.38 MIL/uL (ref 4.22–5.81)
RDW: 13.3 % (ref 11.5–15.5)
WBC: 6.5 10*3/uL (ref 4.0–10.5)

## 2014-05-04 LAB — URINALYSIS, ROUTINE W REFLEX MICROSCOPIC
Bilirubin Urine: NEGATIVE
Glucose, UA: NEGATIVE mg/dL
Hgb urine dipstick: NEGATIVE
Ketones, ur: NEGATIVE mg/dL
Leukocytes, UA: NEGATIVE
Nitrite: NEGATIVE
Protein, ur: NEGATIVE mg/dL
Specific Gravity, Urine: 1.015 (ref 1.005–1.030)
Urobilinogen, UA: 1 mg/dL (ref 0.0–1.0)
pH: 7.5 (ref 5.0–8.0)

## 2014-05-04 LAB — COMPREHENSIVE METABOLIC PANEL
ALT: 28 U/L (ref 0–53)
AST: 27 U/L (ref 0–37)
Albumin: 3.5 g/dL (ref 3.5–5.2)
Alkaline Phosphatase: 70 U/L (ref 39–117)
Anion gap: 8 (ref 5–15)
BUN: 15 mg/dL (ref 6–23)
CO2: 24 mmol/L (ref 19–32)
Calcium: 8.7 mg/dL (ref 8.4–10.5)
Chloride: 108 mmol/L (ref 96–112)
Creatinine, Ser: 0.92 mg/dL (ref 0.50–1.35)
GFR calc Af Amer: >90 mL/min (ref >90)
GFR calc non Af Amer: 85 mL/min — ABNORMAL LOW (ref >90)
Glucose, Bld: 106 mg/dL — ABNORMAL HIGH (ref 70–99)
Potassium: 4.1 mmol/L (ref 3.5–5.1)
Sodium: 140 mmol/L (ref 135–145)
Total Bilirubin: 0.4 mg/dL (ref 0.3–1.2)
Total Protein: 5.8 g/dL — ABNORMAL LOW (ref 6.0–8.3)

## 2014-05-04 LAB — APTT: APTT: 27 s (ref 24–37)

## 2014-05-04 LAB — MAGNESIUM: MAGNESIUM: 1.8 mg/dL (ref 1.5–2.5)

## 2014-05-04 LAB — HEMOGLOBIN A1C
Hgb A1c MFr Bld: 5.8 % — ABNORMAL HIGH (ref <5.7)
MEAN PLASMA GLUCOSE: 120 mg/dL — AB (ref <117)

## 2014-05-04 LAB — PROTIME-INR
INR: 1.1 (ref 0.00–1.49)
PROTHROMBIN TIME: 14.3 s (ref 11.6–15.2)

## 2014-05-04 LAB — BRAIN NATRIURETIC PEPTIDE: B Natriuretic Peptide: 83 pg/mL (ref 0.0–100.0)

## 2014-05-04 LAB — TROPONIN I
Troponin I: <0.03 ng/mL (ref <0.031)
Troponin I: <0.03 ng/mL (ref <0.031)

## 2014-05-04 LAB — TSH: TSH: 1.343 u[IU]/mL (ref 0.350–4.500)

## 2014-05-04 SURGERY — CANCELLED PROCEDURE
Anesthesia: General | Laterality: Bilateral

## 2014-05-04 MED ORDER — DILTIAZEM LOAD VIA INFUSION
20.0000 mg | Freq: Once | INTRAVENOUS | Status: AC
  Administered 2014-05-04: 20 mg via INTRAVENOUS
  Filled 2014-05-04: qty 20

## 2014-05-04 MED ORDER — FENTANYL CITRATE 0.05 MG/ML IJ SOLN
INTRAMUSCULAR | Status: AC
  Filled 2014-05-04: qty 4

## 2014-05-04 MED ORDER — LEVALBUTEROL HCL 0.63 MG/3ML IN NEBU
0.6300 mg | INHALATION_SOLUTION | Freq: Two times a day (BID) | RESPIRATORY_TRACT | Status: DC
  Administered 2014-05-05 – 2014-05-06 (×4): 0.63 mg via RESPIRATORY_TRACT
  Filled 2014-05-04 (×4): qty 3

## 2014-05-04 MED ORDER — ONDANSETRON HCL 4 MG PO TABS: 4.0000 mg | ORAL_TABLET | Freq: Four times a day (QID) | ORAL | Status: DC | PRN

## 2014-05-04 MED ORDER — DILTIAZEM HCL 25 MG/5ML IV SOLN
10.0000 mg | Freq: Once | INTRAVENOUS | Status: AC
  Administered 2014-05-04: 10 mg via INTRAVENOUS
  Filled 2014-05-04: qty 5

## 2014-05-04 MED ORDER — ONDANSETRON HCL 4 MG/2ML IJ SOLN: 4.0000 mg | Freq: Four times a day (QID) | INTRAMUSCULAR | Status: DC | PRN

## 2014-05-04 MED ORDER — FENTANYL CITRATE 0.05 MG/ML IJ SOLN
25.0000 ug | INTRAMUSCULAR | Status: DC | PRN
  Filled 2014-05-04: qty 1

## 2014-05-04 MED ORDER — MIDAZOLAM HCL 5 MG/5ML IJ SOLN
INTRAMUSCULAR | Status: DC | PRN
  Administered 2014-05-04: 2 mg via INTRAVENOUS

## 2014-05-04 MED ORDER — ATORVASTATIN CALCIUM 10 MG PO TABS
10.0000 mg | ORAL_TABLET | Freq: Every day | ORAL | Status: DC
  Administered 2014-05-04 – 2014-05-08 (×5): 10 mg via ORAL
  Filled 2014-05-04 (×5): qty 1

## 2014-05-04 MED ORDER — LACTATED RINGERS IV SOLN
INTRAVENOUS | Status: DC
  Administered 2014-05-04: 08:00:00 via INTRAVENOUS
  Filled 2014-05-04: qty 1000

## 2014-05-04 MED ORDER — METOPROLOL TARTRATE 1 MG/ML IV SOLN
INTRAVENOUS | Status: DC | PRN
  Administered 2014-05-04: 5 mg via INTRAVENOUS

## 2014-05-04 MED ORDER — DILTIAZEM HCL 25 MG/5ML IV SOLN: 20.0000 mg | Freq: Once | INTRAVENOUS | Status: DC

## 2014-05-04 MED ORDER — CEFAZOLIN SODIUM 1-5 GM-% IV SOLN
1.0000 g | INTRAVENOUS | Status: DC
  Filled 2014-05-04: qty 50

## 2014-05-04 MED ORDER — LEVALBUTEROL HCL 0.63 MG/3ML IN NEBU: 0.6300 mg | INHALATION_SOLUTION | Freq: Four times a day (QID) | RESPIRATORY_TRACT | Status: DC | PRN

## 2014-05-04 MED ORDER — FUROSEMIDE 40 MG PO TABS
40.0000 mg | ORAL_TABLET | Freq: Every day | ORAL | Status: DC
  Administered 2014-05-04 – 2014-05-08 (×5): 40 mg via ORAL
  Filled 2014-05-04 (×5): qty 1

## 2014-05-04 MED ORDER — LABETALOL HCL 5 MG/ML IV SOLN
INTRAVENOUS | Status: DC | PRN
  Administered 2014-05-04: 10 mg via INTRAVENOUS

## 2014-05-04 MED ORDER — ACETAMINOPHEN 650 MG RE SUPP: 650.0000 mg | Freq: Four times a day (QID) | RECTAL | Status: DC | PRN

## 2014-05-04 MED ORDER — LEVALBUTEROL HCL 0.63 MG/3ML IN NEBU
0.6300 mg | INHALATION_SOLUTION | Freq: Four times a day (QID) | RESPIRATORY_TRACT | Status: DC
  Administered 2014-05-04 (×2): 0.63 mg via RESPIRATORY_TRACT
  Filled 2014-05-04 (×2): qty 3

## 2014-05-04 MED ORDER — SODIUM CHLORIDE 0.9 % IJ SOLN
3.0000 mL | Freq: Two times a day (BID) | INTRAMUSCULAR | Status: DC
  Administered 2014-05-04 – 2014-05-07 (×3): 3 mL via INTRAVENOUS

## 2014-05-04 MED ORDER — CEFAZOLIN SODIUM-DEXTROSE 2-3 GM-% IV SOLR
2.0000 g | INTRAVENOUS | Status: DC
Start: 2014-05-04 — End: 2014-05-04
  Filled 2014-05-04: qty 50

## 2014-05-04 MED ORDER — DILTIAZEM LOAD VIA INFUSION
5.0000 mg | Freq: Once | INTRAVENOUS | Status: AC
  Administered 2014-05-04: 5 mg via INTRAVENOUS
  Filled 2014-05-04: qty 5

## 2014-05-04 MED ORDER — LACTATED RINGERS IV SOLN
INTRAVENOUS | Status: DC
  Filled 2014-05-04: qty 1000

## 2014-05-04 MED ORDER — SODIUM CHLORIDE 0.9 % IV BOLUS (SEPSIS): 500.0000 mL | Freq: Once | INTRAVENOUS | Status: DC

## 2014-05-04 MED ORDER — DILTIAZEM HCL 100 MG IV SOLR
5.0000 mg/h | INTRAVENOUS | Status: DC
  Administered 2014-05-04: 15 mg/h via INTRAVENOUS
  Administered 2014-05-04: 5 mg/h via INTRAVENOUS
  Administered 2014-05-05: 15 mg/h via INTRAVENOUS
  Filled 2014-05-04 (×3): qty 100

## 2014-05-04 MED ORDER — HEPARIN (PORCINE) IN NACL 100-0.45 UNIT/ML-% IJ SOLN
1400.0000 [IU]/h | INTRAMUSCULAR | Status: DC
  Administered 2014-05-04: 1400 [IU]/h via INTRAVENOUS
  Filled 2014-05-04: qty 250

## 2014-05-04 MED ORDER — ACETAMINOPHEN 325 MG PO TABS: 650.0000 mg | ORAL_TABLET | Freq: Four times a day (QID) | ORAL | Status: DC | PRN

## 2014-05-04 MED ORDER — ENOXAPARIN SODIUM 40 MG/0.4ML ~~LOC~~ SOLN
40.0000 mg | SUBCUTANEOUS | Status: DC
Start: 2014-05-04 — End: 2014-05-04
  Administered 2014-05-04: 40 mg via SUBCUTANEOUS
  Filled 2014-05-04: qty 0.4

## 2014-05-04 MED ORDER — CEFAZOLIN SODIUM-DEXTROSE 2-3 GM-% IV SOLR
INTRAVENOUS | Status: AC
  Filled 2014-05-04: qty 50

## 2014-05-04 MED ORDER — SODIUM CHLORIDE 0.9 % IV SOLN
250.0000 mL | INTRAVENOUS | Status: DC | PRN
  Administered 2014-05-07: 14:00:00 via INTRAVENOUS

## 2014-05-04 MED ORDER — MIDAZOLAM HCL 2 MG/2ML IJ SOLN
INTRAMUSCULAR | Status: AC
  Filled 2014-05-04: qty 2

## 2014-05-04 MED ORDER — ESMOLOL BOLUS VIA INFUSION
INTRAVENOUS | Status: DC | PRN
  Administered 2014-05-04: 50 mg via INTRAVENOUS

## 2014-05-04 MED ORDER — DILTIAZEM HCL 100 MG IV SOLR
10.0000 mg/h | Freq: Once | INTRAVENOUS | Status: AC
  Administered 2014-05-04: 10 mg/h via INTRAVENOUS
  Filled 2014-05-04: qty 100

## 2014-05-04 MED ORDER — FLUTICASONE PROPIONATE 50 MCG/ACT NA SUSP
2.0000 | Freq: Every day | NASAL | Status: DC
  Administered 2014-05-04 – 2014-05-08 (×5): 2 via NASAL
  Filled 2014-05-04: qty 16

## 2014-05-04 MED ORDER — FUROSEMIDE 10 MG/ML IJ SOLN
40.0000 mg | Freq: Once | INTRAMUSCULAR | Status: AC
  Administered 2014-05-04: 40 mg via INTRAVENOUS
  Filled 2014-05-04: qty 4

## 2014-05-04 MED ORDER — SODIUM CHLORIDE 0.9 % IJ SOLN: 3.0000 mL | INTRAMUSCULAR | Status: DC | PRN

## 2014-05-04 MED ORDER — ASPIRIN 81 MG PO CHEW
81.0000 mg | CHEWABLE_TABLET | Freq: Every day | ORAL | Status: DC
  Administered 2014-05-04: 81 mg via ORAL
  Filled 2014-05-04 (×2): qty 1

## 2014-05-04 SURGICAL SUPPLY — 42 items
APPLICATOR COTTON TIP 6IN STRL (MISCELLANEOUS) IMPLANT
BENZOIN TINCTURE PRP APPL 2/3 (GAUZE/BANDAGES/DRESSINGS) IMPLANT
BLADE CLIPPER SURG (BLADE) IMPLANT
BLADE SURG 15 STRL LF DISP TIS (BLADE) IMPLANT
BLADE SURG 15 STRL SS (BLADE)
BNDG GAUZE ELAST 4 BULKY (GAUZE/BANDAGES/DRESSINGS) IMPLANT
CANISTER SUCTION 1200CC (MISCELLANEOUS) IMPLANT
CANISTER SUCTION 2500CC (MISCELLANEOUS) IMPLANT
CLOTH BEACON ORANGE TIMEOUT ST (SAFETY) IMPLANT
COVER MAYO STAND STRL (DRAPES) IMPLANT
COVER TABLE BACK 60X90 (DRAPES) IMPLANT
DRAPE PED LAPAROTOMY (DRAPES) IMPLANT
ELECT NEEDLE TIP 2.8 STRL (NEEDLE) IMPLANT
ELECT REM PT RETURN 9FT ADLT (ELECTROSURGICAL)
ELECTRODE REM PT RTRN 9FT ADLT (ELECTROSURGICAL) IMPLANT
GLOVE BIO SURGEON STRL SZ 6.5 (GLOVE) IMPLANT
GLOVE BIO SURGEON STRL SZ7 (GLOVE) IMPLANT
GLOVE BIOGEL PI IND STRL 6.5 (GLOVE) IMPLANT
GLOVE BIOGEL PI IND STRL 7.5 (GLOVE) IMPLANT
GLOVE BIOGEL PI INDICATOR 6.5 (GLOVE)
GLOVE BIOGEL PI INDICATOR 7.5 (GLOVE)
GOWN STRL REUS W/TWL LRG LVL3 (GOWN DISPOSABLE) IMPLANT
GOWN STRL REUS W/TWL XL LVL3 (GOWN DISPOSABLE) IMPLANT
GOWN W/2 COTTON TOWELS 2 STD (GOWNS) IMPLANT
MARKER SKIN DUAL TIP RULER LAB (MISCELLANEOUS) IMPLANT
NEEDLE HYPO 25X1 1.5 SAFETY (NEEDLE) IMPLANT
NS IRRIG 500ML POUR BTL (IV SOLUTION) IMPLANT
PACK BASIN DAY SURGERY FS (CUSTOM PROCEDURE TRAY) IMPLANT
PAD PREP 24X48 CUFFED NSTRL (MISCELLANEOUS) IMPLANT
PENCIL BUTTON HOLSTER BLD 10FT (ELECTRODE) IMPLANT
STRIP CLOSURE SKIN 1/2X4 (GAUZE/BANDAGES/DRESSINGS) IMPLANT
SUPPORT SCROTAL LG STRP (MISCELLANEOUS) IMPLANT
SUT CHROMIC 3 0 PS 2 (SUTURE) IMPLANT
SUT CHROMIC 3 0 SH 27 (SUTURE) IMPLANT
SUT MON AB 4-0 PC3 18 (SUTURE) IMPLANT
SUT SILK 2 0 SH (SUTURE) IMPLANT
SUT VIC AB 3-0 SH 27 (SUTURE)
SUT VIC AB 3-0 SH 27X BRD (SUTURE) IMPLANT
SUT VICRYL 0 TIES 12 18 (SUTURE) IMPLANT
SYR CONTROL 10ML LL (SYRINGE) IMPLANT
WATER STERILE IRR 500ML POUR (IV SOLUTION) IMPLANT
YANKAUER SUCT BULB TIP NO VENT (SUCTIONS) IMPLANT

## 2014-05-04 NOTE — ED Notes (Signed)
Patient transported to X-ray 

## 2014-05-04 NOTE — Progress Notes (Signed)
Patient has supraventricular tachycardia with heart rate 150 or so.  I attempted to control heart rate in a controlled environment in the OR before proceeding with labetalol 10 mg, metoprolol 5 mg, and esmolol 50 mg, but only got heart rate down to 140.  I am sending him to the ER for medical management of his SVT.  He has been fairly asymptomatic during this except some chest wall discomfort which he felt was a pulled muscle from coughing with a bronchitis that he has had recently.  His room air saturation was 90%.   Tyrrell Stephens MD

## 2014-05-04 NOTE — H&P (Addendum)
Urology Admission H&P  Chief Complaint: Swelling of scrotum  History of Present Illness: [name] has been having swelling of the scrotum for several years.  The left side is larger than the right.  He had left inguinal hernia repair in March 2014.  The surgeon aspirated the hydrocele then but soon after it recurred.  The swelling is more uncomfortable.  He would like to have it removed.  He was found on physical examination to have bilateral hydrocele.  He is scheduled for bilateral hydrocelectomy.  He is on active surveillance for adenocarcinoma of prostate.  Past Medical History  Diagnosis Date  . Hypertension   . Hyperlipidemia   . Gout   . Tinea versicolor   . OSA on CPAP     per study 08-03-2004  Severe OSA  . Allergic rhinitis   . Prostate cancer     Dx  2014--  stage T1c, Gleason 3+3=6, PSA 6.67--  Active surveillance  . Bilateral hydrocele   . History of colon polyps     tubular adenoma 2013  . Sigmoid diverticulosis    Past Surgical History  Procedure Laterality Date  . Colonoscopy  last one 06-07-2011  . Prostate biopsy  05/15/12    Clinically both Lobes  . Tonsillectomy  as child  . Laminectomy and microdiscectomy lumbar spine  12-23-2003    Left L5 -- S1  . Laparoscopic bilateral inguinal hernia repair/  umbilical hernia repair with mesh/  aspiration left hydrocele  07-11-2012    Home Medications:  Prescriptions prior to admission  Medication Sig Dispense Refill Last Dose  . amLODipine (NORVASC) 5 MG tablet Take 1 tablet (5 mg total) by mouth daily. (Patient taking differently: Take 5 mg by mouth every evening. ) 90 tablet 3 05/03/2014 at Unknown time  . amoxicillin-clavulanate (AUGMENTIN) 875-125 MG per tablet Take 1 tablet by mouth 2 (two) times daily. (Patient taking differently: Take 1 tablet by mouth 2 (two) times daily. 14 day treatment-started 04/23/2014) 28 tablet 0 05/03/2014 at Unknown time  . aspirin 81 MG chewable tablet Chew 81 mg by mouth daily.    Past  Week at Unknown time  . atorvastatin (LIPITOR) 10 MG tablet Take 1 tablet (10 mg total) by mouth daily. (Patient taking differently: Take 10 mg by mouth every evening. ) 90 tablet 3 05/03/2014 at Unknown time  . fexofenadine (ALLEGRA) 180 MG tablet Take by mouth daily.     05/03/2014 at Unknown time  . fluticasone (FLONASE) 50 MCG/ACT nasal spray Place 2 sprays into the nose daily. 48 g 3 Past Week at Unknown time  . lisinopril (PRINIVIL,ZESTRIL) 20 MG tablet Take 1 tablet (20 mg total) by mouth daily. (Patient taking differently: Take 20 mg by mouth every evening. ) 90 tablet 3 05/03/2014 at Unknown time  . triamcinolone cream (KENALOG) 0.1 % Apply to affected rash tow times a day as needed 85.2 g 3 05/03/2014 at Unknown time  . fish oil-omega-3 fatty acids 1000 MG capsule Take by mouth daily    More than a month at Unknown time  . indomethacin (INDOCIN) 50 MG capsule Take one by mouth every 8 hours as needed for gout pain 30 capsule 5 More than a month at Unknown time  . ketoconazole (NIZORAL) 200 MG tablet Take 2 tablets by mouth times one dose as needed tinea versicolor    More than a month at Unknown time  . nicotine (NICOTROL) 10 MG inhaler Use as directed 6-16 inhalations as needed daily, taper  as directed    More than a month at Unknown time   Allergies: No Known Allergies  Family History  Problem Relation Age of Onset  . Breast cancer Mother   . Stroke      Unknown   . Colon cancer Neg Hx   . Esophageal cancer Neg Hx   . Rectal cancer Neg Hx   . Stomach cancer Neg Hx   . Breast cancer Mother   . Ovarian cancer Sister    Social History:  reports that he has been smoking Cigarettes.  He has a 55 pack-year smoking history. He does not have any smokeless tobacco history on file. He reports that he drinks about 3.0 oz of alcohol per week. He reports that he does not use illicit drugs.  Review of Systems  Constitutional: Negative.   Skin: Negative.     Physical Exam:  Vital signs in  last 24 hours: Temp:  [97.7 F (36.5 C)] 97.7 F (36.5 C) (01/26 0714) Pulse Rate:  [101] 101 (01/26 0714) Resp:  [18] 18 (01/26 0714) BP: (132)/(99) 132/99 mmHg (01/26 0714) SpO2:  [97 %] 97 % (01/26 0714) Weight:  [102.059 kg (225 lb)] 102.059 kg (225 lb) (01/26 3818) Physical Exam  Constitutional: He is oriented to person, place, and time. He appears well-developed and well-nourished.  HENT:  Head: Normocephalic.  Eyes: Pupils are equal, round, and reactive to light.  Neck: Normal range of motion. Neck supple. No thyromegaly present.  Cardiovascular: Normal rate and normal heart sounds.   No murmur heard. Respiratory: Effort normal and breath sounds normal. No respiratory distress. He has no rales. He exhibits no tenderness.  GI: Soft. He exhibits no distension and no mass. There is no tenderness. Hernia confirmed negative in the right inguinal area and confirmed negative in the left inguinal area.  Genitourinary: Penis normal. Right testis shows swelling. Left testis shows swelling.  Musculoskeletal: Normal range of motion.  Lymphadenopathy:    He has no cervical adenopathy.       Right: No inguinal adenopathy present.       Left: No inguinal adenopathy present.  Neurological: He is alert and oriented to person, place, and time.  Skin: Skin is warm and dry.  Psychiatric: He has a normal mood and affect.    Laboratory Data:  Results for orders placed or performed during the hospital encounter of 05/04/14 (from the past 24 hour(s))  I-STAT 4, (NA,K, GLUC, HGB,HCT)     Status: Abnormal   Collection Time: 05/04/14  8:39 AM  Result Value Ref Range   Sodium 142 135 - 145 mmol/L   Potassium 4.1 3.5 - 5.1 mmol/L   Glucose, Bld 112 (H) 70 - 99 mg/dL   HCT 45.0 39.0 - 52.0 %   Hemoglobin 15.3 13.0 - 17.0 g/dL   No results found for this or any previous visit (from the past 240 hour(s)).   Impression/Assessment:  Bilateral hydrocele  Plan:  Bilateral hydrocelectomy. The  procedure, risks, benefits were explained to the patient.  The risks include but are not limited to hemorrhage, hematoma, infection, recurrence.  He understands and wishes to proceed.  [name] 05/04/2014, 9:09 AM

## 2014-05-04 NOTE — ED Notes (Signed)
Patient was here for urology surgery and was tachycardia on arrival.

## 2014-05-04 NOTE — Progress Notes (Signed)
ANTICOAGULATION CONSULT NOTE - Initial Consult  Pharmacy Consult for Heparin Indication: AFlutter with RVR  No Known Allergies  Patient Measurements: Height: 5\' 11"  (180.3 cm) Weight: 226 lb (102.513 kg) IBW/kg (Calculated) : 75.3 Heparin Dosing Weight: 96.5 kg  Vital Signs: Temp: 98.2 F (36.8 C) (01/26 1003) Temp Source: Oral (01/26 1003) BP: 129/90 mmHg (01/26 1700) Pulse Rate: 124 (01/26 1700)  Labs:  Recent Labs  05/04/14 0839 05/04/14 1119 05/04/14 1453  HGB 15.3 14.7  --   HCT 45.0 43.9  --   PLT  --  208  --   CREATININE  --  0.92  --   TROPONINI  --  <0.03 <0.03    Estimated Creatinine Clearance: 93.7 mL/min (by C-G formula based on Cr of 0.92).   Medical History: Past Medical History  Diagnosis Date  . Hypertension   . Hyperlipidemia   . Gout   . Tinea versicolor   . OSA on CPAP     per study 08-03-2004  Severe OSA  . Allergic rhinitis   . Prostate cancer     Dx  2014--  stage T1c, Gleason 3+3=6, PSA 6.67--  Active surveillance  . Bilateral hydrocele   . History of colon polyps     tubular adenoma 2013  . Sigmoid diverticulosis     Medications:  Scheduled:  . levalbuterol  0.63 mg Nebulization Q6H  . sodium chloride  3 mL Intravenous Q12H  . sodium chloride  3 mL Intravenous Q12H   Infusions:  . sodium chloride    . diltiazem (CARDIZEM) infusion 15 mg/hr (05/04/14 1718)  . diltiazem    . heparin      Assessment:  69 yr male with planned urology procedure found to be tachycardic and sent to ED.  H/O HTN, hyperlipidemia, prostate cancer  Seen by cardiology who noted pt in Atrial flutter with RVR  Pharmacy consulted to dose IV heparin with note plan to begin oral anticoagulation on 1/27  Patient received Lovenox 40mg  sq x 1 @ 15:04 today, so will not bolus heparin  Goal of Therapy:  Heparin level 0.3-0.7 units/ml Monitor platelets by anticoagulation protocol: Yes   Plan:   Begin IV Heparin infusion @ 1400 units/hr  Check  heparin level 6 hr after heparin started  Follow heparin level and CBC daily while on heparin  Ronald [name], Ronald [name], PharmD 05/04/2014,5:31 PM

## 2014-05-04 NOTE — ED Notes (Signed)
Spoke with Jessica CN on 4 west. 20 minutes start as of 1750.

## 2014-05-04 NOTE — H&P (Signed)
[facility] History and Physical  Ronald [name] YQI:[id number] DOB: [birth year] DOA: 05/04/2014  Referring physician:   PCP: [name], MD   Chief Complaint:Tachycardia  HPI: 69 yr male with planned urology procedure found to be tachycardic and sent to ED. H/O HTN, hyperlipidemia, prostate cancer.patient has been sick for about 3 weeks with a sinus infection. He is a smoker. He took 14 days off Augmentin. His sinus congestion cleared up with the patient continued to feel bad for the last 3 or 4 days. Patient does not have any chest pain but did have dyspnea on exertion. No fever no cough. No diarrhea. In the ED the patient's heart rate is 152 with atrial flutter on EKG, hemodynamically stable chest x-ray shows cardiomegaly with pulmonary vascular congestion.   Review of Systems: negative for the following  Constitutional: Denies fever, chills, diaphoresis, appetite change and fatigue.  HEENT: Denies photophobia, eye pain, redness, hearing loss, ear pain, congestion, sore throat, positive for rhinorrhea, sneezing, mouth sores, trouble swallowing, neck pain, neck stiffness and tinnitus.  Respiratory: Positive for SOB, DOE, cough, chest tightness, and wheezing.  Cardiovascular: Denies chest pain, palpitations and leg swelling.  Gastrointestinal: Denies nausea, vomiting, abdominal pain, diarrhea, constipation, blood in stool and abdominal distention.  Genitourinary: Denies dysuria, urgency, frequency, hematuria, flank pain and difficulty urinating.  Musculoskeletal: Denies myalgias, back pain, joint swelling, arthralgias and gait problem.  Skin: Denies pallor, rash and wound.  Neurological: Denies dizziness, seizures, syncope, weakness, light-headedness, numbness and headaches.  Hematological: Denies adenopathy. Easy bruising, personal or family bleeding history  Psychiatric/Behavioral: Denies suicidal ideation, mood changes, confusion, nervousness, sleep disturbance and  agitation       Past Medical History  Diagnosis Date  . Hypertension   . Hyperlipidemia   . Gout   . Tinea versicolor   . OSA on CPAP     per study 08-03-2004  Severe OSA  . Allergic rhinitis   . Prostate cancer     Dx  2014--  stage T1c, Gleason 3+3=6, PSA 6.67--  Active surveillance  . Bilateral hydrocele   . History of colon polyps     tubular adenoma 2013  . Sigmoid diverticulosis      Past Surgical History  Procedure Laterality Date  . Colonoscopy  last one 06-07-2011  . Prostate biopsy  05/15/12    Clinically both Lobes  . Tonsillectomy  as child  . Laminectomy and microdiscectomy lumbar spine  12-23-2003    Left L5 -- S1  . Laparoscopic bilateral inguinal hernia repair/  umbilical hernia repair with mesh/  aspiration left hydrocele  07-11-2012  . Transthoracic echocardiogram  05/01/2014    mild LVH; EF 50-55% (cannot measure diastolic Fxn with Afib), Mod LA Dilation, mild MR      Social History:  reports that he has been smoking Cigarettes.  He has a 55 pack-year smoking history. He has never used smokeless tobacco. He reports that he drinks about 3.0 oz of alcohol per week. He reports that he does not use illicit drugs.    No Known Allergies  Family History  Problem Relation Age of Onset  . Breast cancer Mother   . Stroke      Unknown   . Colon cancer Neg Hx   . Esophageal cancer Neg Hx   . Rectal cancer Neg Hx   . Stomach cancer Neg Hx   . Breast cancer Mother   . Ovarian cancer Sister  Prior to Admission medications   Medication Sig Start Date End Date Taking? Authorizing Provider  amLODipine (NORVASC) 5 MG tablet Take 1 tablet (5 mg total) by mouth daily. Patient taking differently: Take 5 mg by mouth every evening.  11/03/13 11/03/14 Yes [name], MD  amoxicillin-clavulanate (AUGMENTIN) 875-125 MG per tablet Take 1 tablet by mouth 2 (two) times daily. Patient taking differently: Take 1 tablet by mouth 2 (two)  times daily. 14 day treatment-started 04/23/2014 04/22/14  Yes [name], MD  aspirin 81 MG chewable tablet Chew 81 mg by mouth daily.    Yes Historical Provider, MD  atorvastatin (LIPITOR) 10 MG tablet Take 1 tablet (10 mg total) by mouth daily. Patient taking differently: Take 10 mg by mouth every evening.  11/03/13  Yes [name], MD  fexofenadine (ALLEGRA) 180 MG tablet Take 180 mg by mouth daily.    Yes Historical Provider, MD  fluticasone (FLONASE) 50 MCG/ACT nasal spray Place 2 sprays into the nose daily. Patient taking differently: Place 2 sprays into the nose daily as needed for allergies or rhinitis.  03/20/12  Yes [name], MD  indomethacin (INDOCIN) 50 MG capsule Take one by mouth every 8 hours as needed for gout pain 04/17/13  Yes [name], MD  lisinopril (PRINIVIL,ZESTRIL) 20 MG tablet Take 1 tablet (20 mg total) by mouth daily. Patient taking differently: Take 20 mg by mouth every evening.  11/03/13  Yes [name], MD  naproxen sodium (ANAPROX) 220 MG tablet Take 220 mg by mouth every 12 (twelve) hours as needed (for pain).   Yes Historical Provider, MD  triamcinolone cream (KENALOG) 0.1 % Apply to affected rash tow times a day as needed Patient taking differently: Apply 1 application topically 2 (two) times daily as needed (for rash).  07/20/13  Yes [name], MD     Physical Exam: Filed Vitals:   05/04/14 1400 05/04/14 1500 05/04/14 1600 05/04/14 1700  BP: 129/115 125/72 135/83 129/90  Pulse: 132 154 128 124  Temp:      TempSrc:      Resp: 24 24 28 19   Height:      Weight:      SpO2: 94% 94% 91% 92%     Constitutional: Vital signs reviewed. Patient is a morbidly obese and well-nourished in no acute distress and cooperative with exam. Alert and oriented x3.  Head: Normocephalic and atraumatic  Ear: TM normal bilaterally  Mouth: no erythema or exudates, MMM  Eyes: PERRL, EOMI, conjunctivae normal, No scleral icterus.  Neck:  Supple, Trachea midline normal ROM, No JVD, mass, thyromegaly, or carotid bruit present.  Cardiovascular: Irregularly irregular, S1 normal, S2 normal, no MRG, pulses symmetric and intact bilaterally  Pulmonary/Chest: CTAB, no wheezes, rales, or rhonchi  Abdominal: Soft. Non-tender, non-distended, bowel sounds are normal, no masses, organomegaly, or guarding present.  GU: no CVA tenderness Musculoskeletal: No joint deformities, erythema, or stiffness, ROM full and no nontender Ext: no edema and no cyanosis, pulses palpable bilaterally (DP and PT)  Hematology: no cervical, inginal, or axillary adenopathy.  Neurological: A&O x3, Strenght is normal and symmetric bilaterally, cranial nerve II-XII are grossly intact, no focal motor deficit, sensory intact to light touch bilaterally.  Skin: Warm, dry and intact. No rash, cyanosis, or clubbing.  Psychiatric: Normal mood and affect. speech and behavior is normal. Judgment and thought content normal. Cognition and memory are normal.       Labs on Admission:  Basic Metabolic Panel:  Recent Labs Lab 05/04/14 0839 05/04/14 1119 05/04/14 1454  NA 142 140  --   K 4.1 4.1  --   CL  --  108  --   CO2  --  24  --   GLUCOSE 112* 106*  --   BUN  --  15  --   CREATININE  --  0.92  --   CALCIUM  --  8.7  --   MG  --   --  1.8   Liver Function Tests:  Recent Labs Lab 05/04/14 1119  AST 27  ALT 28  ALKPHOS 70  BILITOT 0.4  PROT 5.8*  ALBUMIN 3.5   No results for input(s): LIPASE, AMYLASE in the last 168 hours. No results for input(s): AMMONIA in the last 168 hours. CBC:  Recent Labs Lab 05/04/14 0839 05/04/14 1119  WBC  --  6.5  NEUTROABS  --  4.4  HGB 15.3 14.7  HCT 45.0 43.9  MCV  --  100.2*  PLT  --  208   Cardiac Enzymes:  Recent Labs Lab 05/04/14 1119 05/04/14 1453  TROPONINI <0.03 <0.03    BNP (last 3 results) No results for input(s): PROBNP in the last 8760 hours.    CBG: No results for input(s): GLUCAP  in the last 168 hours.  Radiological Exams on Admission: Dg Chest 2 View  05/04/2014   CLINICAL DATA:  Tachycardia today, shortness of breath, hypertension, smoker, prostate cancer, had planned testicular surgery today which could not be performed due to tachycardia  EXAM: CHEST  2 VIEW  COMPARISON:  12/23/2003  FINDINGS: Enlargement of cardiac silhouette with pulmonary vascular congestion.  Interstitial prominence in perihilar regions extending into lung bases question pulmonary edema.  No segmental consolidation or pneumothorax.  Tiny BILATERAL pleural effusions.  No acute osseous findings.  IMPRESSION: Enlargement of cardiac silhouette with pulmonary vascular congestion and question minimal pulmonary edema.  Tiny bibasilar pleural effusions.   Electronically Signed   By: Lavonia Dana M.D.   On: 05/04/2014 12:14    EKG: Independently reviewed. EKG Interpretation   Date/Time: Tuesday May 04 2014 11:24:13 EST Ventricular Rate: 152 PR Interval:  QRS Duration: 88 QT Interval: 261 QTC Calculation: 415 R Axis: -111 Text Interpretation: Narrow QRS tachycardia `suspect a flutter   Assessment/Plan Active Problems:   Atrial flutter   Atrial flutter with rapid ventricular response   Atrial flutter with rapid ventricular response Patient started on a Cardizem drip Anticoagulation to be decided by cardiology Appreciate their input 2-D echo EF of 28-00% diastolic dysfunction   Shortness of breath secondary to atrial flutter patient also pulmonary vascular congestion Patient was given 1 dose of IV Lasix by me and symptoms have improved Will continue Lasix 40 mg by mouth daily  Hypertension Hold Norvasc  Nicotine dependence Will provide the patient with a nicotine patch    Code Status:   full Family Communication: bedside Disposition Plan: admit   Time spent: 70 mins   [facility] Pager [phone]  If 7PM-7AM, please contact  night-coverage [email or web address] Password [facility] 05/04/2014, 5:47 PM    [email or web address]

## 2014-05-04 NOTE — Consult Note (Signed)
CARDIOLOGY CONSULTATION NOTE.  NAME:  Ronald [name]   MRN: [id number] DOB:  [birth year]   ADMIT DATE: 05/04/2014  Reason for Consult: Narrow Complex Tachycardia - likely Atrial Flutter  Requesting Physician: Dr. Allyson Sabal  Primary Cardiologist: None  HPI: This is a 69 y.o. male with a past medical history significant for HTN, HLD, OSA & Prostate CA with not formal cardiac work-up or Dx who was coming in for a planned Urology procedure & was noted to be tachycardic.  Procedure cancelled & he was sent to ER. He notes having had a significant sinus/chest cold for the last couple weeks. He was having some sharp discomfort on the right side of his chest after significant coughing. He noted some splitting wood taking deep breaths but otherwise was doing okay from a respiratory standpoint, and improving. He has noticed fatigue for a couple days but is not able to give any exact timeline is when his symptoms began. Despite feeling poorly, he came in for his scheduled elective urologic procedure. He was noted to be tachycardic with a rate in the 150s beats per minute range. Thought to be atrial flutter. He was hemodynamically stable but felt tired and fatigued. Chest x-ray showed evidence of pulmonary vascular congestion.  An echocardiogram performed showed relatively preserved EF with no significant regional wall motion abnormality.  Cardiovascular ROS: positive for - dyspnea on exertion and Fatigue, dyspnea with bending over, dizziness, mild symptoms consistent with potential orthopnea and mild PND. negative for - chest pain, edema, irregular heartbeat, loss of consciousness, murmur, palpitations, rapid heart rate or TIA/amaurosis fugax, syncope/near syncope.  PMHx:  CARDIAC HISTORY: No recorded evaluation -- echo from today noted in [facility]  Past Medical History  Diagnosis Date  . Hypertension   . Hyperlipidemia   . Gout   . Tinea versicolor   . OSA on CPAP     per study 08-03-2004  Severe OSA  .  Allergic rhinitis   . Prostate cancer     Dx  2014--  stage T1c, Gleason 3+3=6, PSA 6.67--  Active surveillance  . Bilateral hydrocele   . History of colon polyps     tubular adenoma 2013  . Sigmoid diverticulosis   . Cigarette smoker two packs a day or less    Past Surgical History  Procedure Laterality Date  . Colonoscopy  last one 06-07-2011  . Prostate biopsy  05/15/12    Clinically both Lobes  . Tonsillectomy  as child  . Laminectomy and microdiscectomy lumbar spine  12-23-2003    Left L5 -- S1  . Laparoscopic bilateral inguinal hernia repair/  umbilical hernia repair with mesh/  aspiration left hydrocele  07-11-2012  . Transthoracic echocardiogram  05/01/2014    mild LVH; EF 50-55% (cannot measure diastolic Fxn with Afib), Mod LA Dilation, mild MR    FAMHx: Family History  Problem Relation Age of Onset  . Breast cancer Mother   . Stroke      Unknown   . Colon cancer Neg Hx   . Esophageal cancer Neg Hx   . Rectal cancer Neg Hx   . Stomach cancer Neg Hx   . Breast cancer Mother   . Ovarian cancer Sister     SOCHx:  reports that he has been smoking Cigarettes.  He has a 55 pack-year smoking history. He has never used smokeless tobacco. He reports that he drinks about 3.0 oz of alcohol per week. He reports that he does not use illicit drugs.  ALLERGIES:  No Known Allergies  ROS:A comprehensive review of systems was performed Review of Systems  Constitutional: Positive for malaise/fatigue (For the last several days). Negative for fever (no recent fever).  HENT: Positive for congestion and sore throat.   Respiratory: Positive for cough, sputum production, shortness of breath and wheezing. Negative for hemoptysis.   Cardiovascular:       See history of present illness  Gastrointestinal: Positive for nausea. Negative for blood in stool and melena.  Genitourinary: Negative for dysuria and hematuria.  Musculoskeletal: Positive for joint pain.       Right-sided chest wall  pain  Neurological: Positive for weakness (Generalized).  Endo/Heme/Allergies: Does not bruise/bleed easily.  Psychiatric/Behavioral: Negative for depression and memory loss. The patient is not nervous/anxious and does not have insomnia.   All other systems reviewed and are negative.   HOME MEDICATIONS: No current facility-administered medications on file prior to encounter.   Current Outpatient Prescriptions on File Prior to Encounter  Medication Sig Dispense Refill  . amLODipine (NORVASC) 5 MG tablet Take 1 tablet (5 mg total) by mouth daily. (Patient taking differently: Take 5 mg by mouth every evening. ) 90 tablet 3  . amoxicillin-clavulanate (AUGMENTIN) 875-125 MG per tablet Take 1 tablet by mouth 2 (two) times daily. (Patient taking differently: Take 1 tablet by mouth 2 (two) times daily. 14 day treatment-started 04/23/2014) 28 tablet 0  . aspirin 81 MG chewable tablet Chew 81 mg by mouth daily.     Marland Kitchen atorvastatin (LIPITOR) 10 MG tablet Take 1 tablet (10 mg total) by mouth daily. (Patient taking differently: Take 10 mg by mouth every evening. ) 90 tablet 3  . fexofenadine (ALLEGRA) 180 MG tablet Take 180 mg by mouth daily.     . fluticasone (FLONASE) 50 MCG/ACT nasal spray Place 2 sprays into the nose daily. (Patient taking differently: Place 2 sprays into the nose daily as needed for allergies or rhinitis. ) 48 g 3  . indomethacin (INDOCIN) 50 MG capsule Take one by mouth every 8 hours as needed for gout pain 30 capsule 5  . lisinopril (PRINIVIL,ZESTRIL) 20 MG tablet Take 1 tablet (20 mg total) by mouth daily. (Patient taking differently: Take 20 mg by mouth every evening. ) 90 tablet 3  . triamcinolone cream (KENALOG) 0.1 % Apply to affected rash tow times a day as needed (Patient taking differently: Apply 1 application topically 2 (two) times daily as needed (for rash). ) 85.2 g 3   Prescriptions prior to admission  Medication Sig Dispense Refill Last Dose  . amLODipine (NORVASC) 5 MG  tablet Take 1 tablet (5 mg total) by mouth daily. (Patient taking differently: Take 5 mg by mouth every evening. ) 90 tablet 3 05/03/2014 at pm  . amoxicillin-clavulanate (AUGMENTIN) 875-125 MG per tablet Take 1 tablet by mouth 2 (two) times daily. (Patient taking differently: Take 1 tablet by mouth 2 (two) times daily. 14 day treatment-started 04/23/2014) 28 tablet 0 05/03/2014 at Unknown time  . aspirin 81 MG chewable tablet Chew 81 mg by mouth daily.    Past Week at Unknown time  . atorvastatin (LIPITOR) 10 MG tablet Take 1 tablet (10 mg total) by mouth daily. (Patient taking differently: Take 10 mg by mouth every evening. ) 90 tablet 3 05/03/2014 at pm  . fexofenadine (ALLEGRA) 180 MG tablet Take 180 mg by mouth daily.    05/03/2014 at Unknown time  . fluticasone (FLONASE) 50 MCG/ACT nasal spray Place 2 sprays into the nose daily. (Patient taking  differently: Place 2 sprays into the nose daily as needed for allergies or rhinitis. ) 48 g 3 Past Week at Unknown time  . indomethacin (INDOCIN) 50 MG capsule Take one by mouth every 8 hours as needed for gout pain 30 capsule 5 Past Month at Unknown time  . lisinopril (PRINIVIL,ZESTRIL) 20 MG tablet Take 1 tablet (20 mg total) by mouth daily. (Patient taking differently: Take 20 mg by mouth every evening. ) 90 tablet 3 05/03/2014 at Unknown time  . naproxen sodium (ANAPROX) 220 MG tablet Take 220 mg by mouth every 12 (twelve) hours as needed (for pain).   05/03/2014 at Unknown time  . triamcinolone cream (KENALOG) 0.1 % Apply to affected rash tow times a day as needed (Patient taking differently: Apply 1 application topically 2 (two) times daily as needed (for rash). ) 85.2 g 3 05/03/2014 at Unknown time    HOSPITAL MEDICATIONS: Scheduled Meds: . aspirin  81 mg Oral Daily  . atorvastatin  10 mg Oral Daily  . fluticasone  2 spray Each Nare Daily  . furosemide  40 mg Oral Daily  . levalbuterol  0.63 mg Nebulization Q6H  . sodium chloride  3 mL Intravenous Q12H   . sodium chloride  3 mL Intravenous Q12H   Continuous Infusions: . diltiazem (CARDIZEM) infusion 15 mg/hr (05/04/14 2048)  . heparin 1,400 Units/hr (05/04/14 1819)   PRN Meds:.sodium chloride, acetaminophen **OR** acetaminophen, ondansetron **OR** ondansetron (ZOFRAN) IV, sodium chloride  VITALS: Blood pressure 119/64, pulse 97, temperature 98 F (36.7 C), temperature source Oral, resp. rate 18, height 5\' 11"  (1.803 m), weight 226 lb (102.513 kg), SpO2 96 %.  PHYSICAL EXAM: Physical Exam  Constitutional: He appears healthy. No distress.  HENT:  Nose: Nose normal. No nasal discharge.  Mouth/Throat: Oropharynx is clear.  Mild frontal sinus dullness  Eyes: Conjunctivae are normal. Pupils are equal, round, and reactive to light.  Neck: Normal range of motion and thyroid normal. Neck supple. No JVD (Blood pulsatile waves noted from irregular rhythm) present. No adenopathy. No thyromegaly present.  Cardiovascular: An irregular rhythm present. Tachycardia present.  PMI is not displaced.   Murmur heard.  Low-pitched crescendo-decrescendo early systolic murmur is present with a grade of 1/6  at the upper right sternal border Observing monitor with carotid massage I noted obvious flutter waves with transient reduction of HR into the 80s  Pulmonary/Chest: Increased effort noted. He has no wheezes. Rales: Mild. He has bibasilar rales. Tenderness: Right upper chest.  Abdominal: Soft. Bowel sounds are normal. He exhibits distension (nnot distended, but increased girth from baseline; no obvious signs of ascites). He exhibits no mass. There is no splenomegaly or hepatomegaly (Mild HJR). There is no tenderness.  Musculoskeletal: Normal range of motion. He exhibits edema (trace).  Neurological: He is alert and oriented to person, place, and time. He has normal reflexes and intact cranial nerves.  Skin: Skin is warm and dry. No rash noted.    LABS: Results for orders placed or performed during the  hospital encounter of 05/04/14 (from the past 24 hour(s))  Comprehensive metabolic panel     Status: Abnormal   Collection Time: 05/04/14 11:19 AM  Result Value Ref Range   Sodium 140 135 - 145 mmol/L   Potassium 4.1 3.5 - 5.1 mmol/L   Chloride 108 96 - 112 mmol/L   CO2 24 19 - 32 mmol/L   Glucose, Bld 106 (H) 70 - 99 mg/dL   BUN 15 6 - 23 mg/dL  Creatinine, Ser 0.92 0.50 - 1.35 mg/dL   Calcium 8.7 8.4 - 10.5 mg/dL   Total Protein 5.8 (L) 6.0 - 8.3 g/dL   Albumin 3.5 3.5 - 5.2 g/dL   AST 27 0 - 37 U/L   ALT 28 0 - 53 U/L   Alkaline Phosphatase 70 39 - 117 U/L   Total Bilirubin 0.4 0.3 - 1.2 mg/dL   GFR calc non Af Amer 85 (L) >90 mL/min   GFR calc Af Amer >90 >90 mL/min   Anion gap 8 5 - 15  CBC with Differential     Status: Abnormal   Collection Time: 05/04/14 11:19 AM  Result Value Ref Range   WBC 6.5 4.0 - 10.5 K/uL   RBC 4.38 4.22 - 5.81 MIL/uL   Hemoglobin 14.7 13.0 - 17.0 g/dL   HCT 43.9 39.0 - 52.0 %   MCV 100.2 (H) 78.0 - 100.0 fL   MCH 33.6 26.0 - 34.0 pg   MCHC 33.5 30.0 - 36.0 g/dL   RDW 13.3 11.5 - 15.5 %   Platelets 208 150 - 400 K/uL   Neutrophils Relative % 68 43 - 77 %   Neutro Abs 4.4 1.7 - 7.7 K/uL   Lymphocytes Relative 23 12 - 46 %   Lymphs Abs 1.5 0.7 - 4.0 K/uL   Monocytes Relative 7 3 - 12 %   Monocytes Absolute 0.5 0.1 - 1.0 K/uL   Eosinophils Relative 3 0 - 5 %   Eosinophils Absolute 0.2 0.0 - 0.7 K/uL   Basophils Relative 1 0 - 1 %   Basophils Absolute 0.0 0.0 - 0.1 K/uL  Troponin I     Status: None   Collection Time: 05/04/14 11:19 AM  Result Value Ref Range   Troponin I <0.03 <0.031 ng/mL  Urinalysis, Routine w reflex microscopic     Status: Abnormal   Collection Time: 05/04/14 11:23 AM  Result Value Ref Range   Color, Urine YELLOW YELLOW   APPearance CLOUDY (A) CLEAR   Specific Gravity, Urine 1.015 1.005 - 1.030   pH 7.5 5.0 - 8.0   Glucose, UA NEGATIVE NEGATIVE mg/dL   Hgb urine dipstick NEGATIVE NEGATIVE   Bilirubin Urine  NEGATIVE NEGATIVE   Ketones, ur NEGATIVE NEGATIVE mg/dL   Protein, ur NEGATIVE NEGATIVE mg/dL   Urobilinogen, UA 1.0 0.0 - 1.0 mg/dL   Nitrite NEGATIVE NEGATIVE   Leukocytes, UA NEGATIVE NEGATIVE  Brain natriuretic peptide     Status: None   Collection Time: 05/04/14  2:52 PM  Result Value Ref Range   B Natriuretic Peptide 83.0 0.0 - 100.0 pg/mL  TSH     Status: None   Collection Time: 05/04/14  2:53 PM  Result Value Ref Range   TSH 1.343 0.350 - 4.500 uIU/mL  Troponin I     Status: None   Collection Time: 05/04/14  2:53 PM  Result Value Ref Range   Troponin I <0.03 <0.031 ng/mL  Hemoglobin A1c     Status: Abnormal   Collection Time: 05/04/14  2:53 PM  Result Value Ref Range   Hgb A1c MFr Bld 5.8 (H) <5.7 %   Mean Plasma Glucose 120 (H) <117 mg/dL  Magnesium     Status: None   Collection Time: 05/04/14  2:54 PM  Result Value Ref Range   Magnesium 1.8 1.5 - 2.5 mg/dL  APTT     Status: None   Collection Time: 05/04/14  6:22 PM  Result Value Ref Range  aPTT 27 24 - 37 seconds  Protime-INR     Status: None   Collection Time: 05/04/14  6:22 PM  Result Value Ref Range   Prothrombin Time 14.3 11.6 - 15.2 seconds   INR 1.10 0.00 - 1.49    IMAGING: No results found.  IMPRESSION: Principal Problem:   Atrial flutter with rapid ventricular response Active Problems:   Hyperlipidemia   Obstructive sleep apnea   Essential hypertension   Prediabetes   Cigarette smoker two packs a day or less   Atrial flutter   RECOMMENDATION: 1. Tachycardia - likely Atrial Flutter: Looking at the telemetry there is some irregular conduction that would give the appearance of possible atrial fibrillation versus just flutter with variable block. His rate has improved with IV diltiazem.  Agree with continue IV diltiazem drip, I will re-bolus additional 10 mg for further loading. -- would hope for spontaneous conversion to sinus rhythm. CHA2DS2VASC score of 2 -- Recommendation would be to  anticoagulate. Will initiate IV heparin currently and consult care management for determining appropriate NOAC: Xarelto versus Eliquis or Savaysa Plan now will be rate control on a for spontaneous conversion. Could consider cardioversion if he continues to be symptomatic. However with the unclear onset of A. Fib/flutter, which we would need a TEE in no acute consider cardioversion. Preferably would reassess after 4 weeks. Echocardiogram showed relatively preserved EF with no regional wall motion abnormalites - suspect he has some component of diastolic dysfunction (unable to determine with A. Fib) and that is making him more symptomatic. 69 year old gentleman with hypertension, hyperlipidemia, obesity with prediabetes who now presents with  New diagnosis of atrial flutter/fibrillation. He will be an ischemic evaluation. To consider this if his hospitalization last more than one day. Otherwise would consider an outpatient Myoview if his hospital stay can be shortened with spontaneous conversion 2 sinus rhythm.  2. HTN: on amlodipine at home along with ACE inhibitor. I would continue the ACE inhibitor, hold amlodipine in order to avoid 2 different calcium channel blockers. Blood pressure appears to be controlled currently. 3. Hyperlipidemia: On statin. 4. Chronic smoker: Counseling briefly given. Indicated that this is a independent cardiac risk factor 5. Prediabetes: Would consider checking hemoglobin A1c and monitor glycemic control in the hospital. 6. He has CPAP which is also a related cardiovascular risk factor.   Time Spent Directly with Patient: 45 minutes  Chevis Weisensel, Leonie Green, M.D., M.S. Interventional Cardiologist   Pager # [phone]

## 2014-05-04 NOTE — Progress Notes (Signed)
Pt is using home CPAP, nasal mask and tubing (home settings unknown). There are no visible frays in the cord and CPAP appears to be in good working condition. [employer] has been contacted and will inspect CPAP tomorrow when available. Pt states he does not require any assistance with CPAP or mask application. Pt was encouraged to contact RT for assistance if needed. RT will continue to monitor as needed.

## 2014-05-04 NOTE — Transfer of Care (Signed)
Immediate Anesthesia Transfer of Care Note  Patient: Ronald [name]  Procedure(s) Performed: Procedure(s): HYDROCELECTOMY ADULT (Bilateral)  Patient Location: PACU  Anesthesia Type:  Level of Consciousness: awake, alert  and oriented  Airway & Oxygen Therapy: Patient Spontanous Breathing and Patient connected to nasal cannula oxygen  Post-op Assessment: Report given to PACU RN  Post vital signs: Reviewed and stable  Complications: No apparent anesthesia complications

## 2014-05-04 NOTE — ED Provider Notes (Signed)
CSN: [id number]     Arrival date & time 05/04/14  3474 History   First MD Initiated Contact with Patient 05/04/14 1022     Chief Complaint  Patient presents with  . Tachycardia     (Consider location/radiation/quality/duration/timing/severity/associated sxs/prior Treatment) HPI Patient presents to the emergency department with generalized weakness and tachycardia.  Patient states that he has noticed he said some shortness of breath with generalized weakness over the last 6 weeks.  The patient states that he has not had any problems like this in the past.  The patient states that he has had some cough.  Patient is a smoker.  Patient states he has not had any chest pain, nausea, vomiting, back pain, dysuria, hematuria, hematemesis, bloody stool, rash, cough, rhinorrhea, sore throat, numbness, dizziness, lightheadedness, rash, or syncope.  Patient states that he did not take any medications prior to arrival for his symptoms Past Medical History  Diagnosis Date  . Hypertension   . Hyperlipidemia   . Gout   . Tinea versicolor   . OSA on CPAP     per study 08-03-2004  Severe OSA  . Allergic rhinitis   . Prostate cancer     Dx  2014--  stage T1c, Gleason 3+3=6, PSA 6.67--  Active surveillance  . Bilateral hydrocele   . History of colon polyps     tubular adenoma 2013  . Sigmoid diverticulosis    Past Surgical History  Procedure Laterality Date  . Colonoscopy  last one 06-07-2011  . Prostate biopsy  05/15/12    Clinically both Lobes  . Tonsillectomy  as child  . Laminectomy and microdiscectomy lumbar spine  12-23-2003    Left L5 -- S1  . Laparoscopic bilateral inguinal hernia repair/  umbilical hernia repair with mesh/  aspiration left hydrocele  07-11-2012   Family History  Problem Relation Age of Onset  . Breast cancer Mother   . Stroke      Unknown   . Colon cancer Neg Hx   . Esophageal cancer Neg Hx   . Rectal cancer Neg Hx   . Stomach cancer Neg Hx   . Breast cancer Mother    . Ovarian cancer Sister    History  Substance Use Topics  . Smoking status: Current Every Day Smoker -- 1.00 packs/day for 55 years    Types: Cigarettes  . Smokeless tobacco: Not on file  . Alcohol Use: 3.0 oz/week    6 Not specified per week    Review of Systems   All other systems negative except as documented in the HPI. All pertinent positives and negatives as reviewed in the HPI. Allergies  Review of patient's allergies indicates no known allergies.  Home Medications   Prior to Admission medications   Medication Sig Start Date End Date Taking? Authorizing Provider  amLODipine (NORVASC) 5 MG tablet Take 1 tablet (5 mg total) by mouth daily. Patient taking differently: Take 5 mg by mouth every evening.  11/03/13 11/03/14 Yes [name], MD  amoxicillin-clavulanate (AUGMENTIN) 875-125 MG per tablet Take 1 tablet by mouth 2 (two) times daily. Patient taking differently: Take 1 tablet by mouth 2 (two) times daily. 14 day treatment-started 04/23/2014 04/22/14  Yes [name], MD  aspirin 81 MG chewable tablet Chew 81 mg by mouth daily.    Yes Historical Provider, MD  atorvastatin (LIPITOR) 10 MG tablet Take 1 tablet (10 mg total) by mouth daily. Patient taking differently: Take 10 mg by mouth every evening.  11/03/13  Yes [name], MD  fexofenadine (ALLEGRA) 180 MG tablet Take 180 mg by mouth daily.    Yes Historical Provider, MD  fluticasone (FLONASE) 50 MCG/ACT nasal spray Place 2 sprays into the nose daily. Patient taking differently: Place 2 sprays into the nose daily as needed for allergies or rhinitis.  03/20/12  Yes [name], MD  indomethacin (INDOCIN) 50 MG capsule Take one by mouth every 8 hours as needed for gout pain 04/17/13  Yes [name], MD  lisinopril (PRINIVIL,ZESTRIL) 20 MG tablet Take 1 tablet (20 mg total) by mouth daily. Patient taking differently: Take 20 mg by mouth every evening.  11/03/13  Yes [name], MD  naproxen  sodium (ANAPROX) 220 MG tablet Take 220 mg by mouth every 12 (twelve) hours as needed (for pain).   Yes Historical Provider, MD  triamcinolone cream (KENALOG) 0.1 % Apply to affected rash tow times a day as needed Patient taking differently: Apply 1 application topically 2 (two) times daily as needed (for rash).  07/20/13  Yes [name], MD   BP 105/62 mmHg  Pulse 57  Temp(Src) 98.2 F (36.8 C) (Oral)  Resp 22  Ht 5\' 11"  (1.803 m)  Wt 226 lb (102.513 kg)  BMI 31.53 kg/m2  SpO2 94% Physical Exam  Constitutional: He is oriented to person, place, and time. He appears well-developed and well-nourished. No distress.  HENT:  Head: Normocephalic and atraumatic.  Mouth/Throat: Oropharynx is clear and moist.  Eyes: Pupils are equal, round, and reactive to light.  Neck: Normal range of motion. Neck supple.  Cardiovascular: Regular rhythm and normal heart sounds.  Tachycardia present.  Exam reveals no gallop and no friction rub.   No murmur heard. Pulmonary/Chest: Effort normal and breath sounds normal. No respiratory distress. He has no wheezes.  Musculoskeletal: He exhibits edema.  Neurological: He is alert and oriented to person, place, and time. No cranial nerve deficit. He exhibits normal muscle tone. Coordination normal.  Skin: Skin is warm and dry. No rash noted. No erythema.  Nursing note and vitals reviewed.   ED Course  Procedures (including critical care time) Labs Review Labs Reviewed  COMPREHENSIVE METABOLIC PANEL - Abnormal; Notable for the following:    Glucose, Bld 106 (*)    Total Protein 5.8 (*)    GFR calc non Af Amer 85 (*)    All other components within normal limits  CBC WITH DIFFERENTIAL/PLATELET - Abnormal; Notable for the following:    MCV 100.2 (*)    All other components within normal limits  URINALYSIS, ROUTINE W REFLEX MICROSCOPIC - Abnormal; Notable for the following:    APPearance CLOUDY (*)    All other components within normal limits  TROPONIN  I  BRAIN NATRIURETIC PEPTIDE    Imaging Review Dg Chest 2 View  05/04/2014   CLINICAL DATA:  Tachycardia today, shortness of breath, hypertension, smoker, prostate cancer, had planned testicular surgery today which could not be performed due to tachycardia  EXAM: CHEST  2 VIEW  COMPARISON:  12/23/2003  FINDINGS: Enlargement of cardiac silhouette with pulmonary vascular congestion.  Interstitial prominence in perihilar regions extending into lung bases question pulmonary edema.  No segmental consolidation or pneumothorax.  Tiny BILATERAL pleural effusions.  No acute osseous findings.  IMPRESSION: Enlargement of cardiac silhouette with pulmonary vascular congestion and question minimal pulmonary edema.  Tiny bibasilar pleural effusions.   Electronically Signed   By: Lavonia Dana M.D.   On: 05/04/2014  12:14     EKG Interpretation   Date/Time:  Tuesday May 04 2014 11:24:13 EST Ventricular Rate:  152 PR Interval:    QRS Duration: 88 QT Interval:  261 QTC Calculation: 415 R Axis:   -111 Text Interpretation:  Narrow QRS tachycardia `suspect a flutter  Non-specific ST-t changes Confirmed by Ashok Cordia  MD, Lennette Bihari ([id number]) on  05/04/2014 11:28:58 AM     Patient be admitted to the hospital for further evaluation and care.  Spoke with the [facility] who agrees to come and see the patient for admission MDM   Final diagnoses:  Atrial flutter, unspecified       Brent General, PA-C 05/04/14 [place], MD 05/06/14 1331

## 2014-05-04 NOTE — ED Notes (Signed)
Cardio in room

## 2014-05-04 NOTE — Progress Notes (Signed)
Echocardiogram 2D Echocardiogram has been performed.  Ronald [name] 05/04/2014, 2:51 PM

## 2014-05-04 NOTE — Anesthesia Preprocedure Evaluation (Addendum)
Anesthesia Evaluation  Patient identified by MRN, date of birth, ID band Patient awake    Reviewed: Allergy & Precautions, H&P , NPO status , Patient's Chart, lab work & pertinent test results  Airway Mallampati: II  TM Distance: >3 FB Neck ROM: full    Dental no notable dental hx. (+) Teeth Intact, Dental Advisory Given   Pulmonary sleep apnea and Continuous Positive Airway Pressure Ventilation , Current Smoker,  breath sounds clear to auscultation  Pulmonary exam normal       Cardiovascular hypertension, Pt. on medications Rhythm:regular Rate:Normal  Supraventricular tachycardia currently.  Will give beta blocker in OR to see if can control heart rate before proceeding.   Neuro/Psych negative neurological ROS  negative psych ROS   GI/Hepatic negative GI ROS, Neg liver ROS,   Endo/Other  negative endocrine ROSPrediabetes. Metabolic syndrome  Renal/GU negative Renal ROS  negative genitourinary   Musculoskeletal   Abdominal   Peds  Hematology negative hematology ROS (+)   Anesthesia Other Findings   Reproductive/Obstetrics negative OB ROS Prostate cancer                          Anesthesia Physical Anesthesia Plan  ASA: III  Anesthesia Plan: General   Post-op Pain Management:    Induction: Intravenous  Airway Management Planned: LMA  Additional Equipment:   Intra-op Plan:   Post-operative Plan:   Informed Consent: I have reviewed the patients History and Physical, chart, labs and discussed the procedure including the risks, benefits and alternatives for the proposed anesthesia with the patient or authorized representative who has indicated his/her understanding and acceptance.   Dental Advisory Given  Plan Discussed with: CRNA and Surgeon  Anesthesia Plan Comments:         Anesthesia Quick Evaluation

## 2014-05-05 DIAGNOSIS — Z720 Tobacco use: Secondary | ICD-10-CM | POA: Insufficient documentation

## 2014-05-05 DIAGNOSIS — E876 Hypokalemia: Secondary | ICD-10-CM | POA: Insufficient documentation

## 2014-05-05 LAB — COMPREHENSIVE METABOLIC PANEL
ALBUMIN: 3.5 g/dL (ref 3.5–5.2)
ALK PHOS: 67 U/L (ref 39–117)
ALT: 22 U/L (ref 0–53)
AST: 18 U/L (ref 0–37)
Anion gap: 9 (ref 5–15)
BUN: 16 mg/dL (ref 6–23)
CO2: 28 mmol/L (ref 19–32)
CREATININE: 0.95 mg/dL (ref 0.50–1.35)
Calcium: 8.5 mg/dL (ref 8.4–10.5)
Chloride: 106 mmol/L (ref 96–112)
GFR calc non Af Amer: 84 mL/min — ABNORMAL LOW (ref >90)
GLUCOSE: 112 mg/dL — AB (ref 70–99)
POTASSIUM: 3.1 mmol/L — AB (ref 3.5–5.1)
SODIUM: 143 mmol/L (ref 135–145)
TOTAL PROTEIN: 5.7 g/dL — AB (ref 6.0–8.3)
Total Bilirubin: 0.8 mg/dL (ref 0.3–1.2)

## 2014-05-05 LAB — CBC
HCT: 42 % (ref 39.0–52.0)
Hemoglobin: 13.8 g/dL (ref 13.0–17.0)
MCH: 32.9 pg (ref 26.0–34.0)
MCHC: 32.9 g/dL (ref 30.0–36.0)
MCV: 100 fL (ref 78.0–100.0)
Platelets: 226 10*3/uL (ref 150–400)
RBC: 4.2 MIL/uL — AB (ref 4.22–5.81)
RDW: 13.5 % (ref 11.5–15.5)
WBC: 6.7 10*3/uL (ref 4.0–10.5)

## 2014-05-05 LAB — HEPARIN LEVEL (UNFRACTIONATED)
HEPARIN UNFRACTIONATED: 0.44 [IU]/mL (ref 0.30–0.70)
HEPARIN UNFRACTIONATED: 0.22 [IU]/mL — AB (ref 0.30–0.70)

## 2014-05-05 LAB — TROPONIN I: Troponin I: <0.03 ng/mL (ref <0.031)

## 2014-05-05 MED ORDER — APIXABAN 5 MG PO TABS
5.0000 mg | ORAL_TABLET | Freq: Two times a day (BID) | ORAL | Status: DC
  Administered 2014-05-05 – 2014-05-08 (×7): 5 mg via ORAL
  Filled 2014-05-05 (×8): qty 1

## 2014-05-05 MED ORDER — HEPARIN (PORCINE) IN NACL 100-0.45 UNIT/ML-% IJ SOLN
1550.0000 [IU]/h | INTRAMUSCULAR | Status: DC
  Filled 2014-05-05: qty 250

## 2014-05-05 MED ORDER — DILTIAZEM HCL ER COATED BEADS 120 MG PO CP24
120.0000 mg | ORAL_CAPSULE | Freq: Once | ORAL | Status: AC
  Administered 2014-05-05: 120 mg via ORAL
  Filled 2014-05-05: qty 1

## 2014-05-05 MED ORDER — POTASSIUM CHLORIDE CRYS ER 20 MEQ PO TBCR
40.0000 meq | EXTENDED_RELEASE_TABLET | Freq: Once | ORAL | Status: AC
  Administered 2014-05-05: 40 meq via ORAL
  Filled 2014-05-05: qty 2

## 2014-05-05 MED ORDER — DILTIAZEM HCL ER COATED BEADS 180 MG PO CP24
360.0000 mg | ORAL_CAPSULE | Freq: Every day | ORAL | Status: DC
  Administered 2014-05-06 – 2014-05-08 (×3): 360 mg via ORAL
  Filled 2014-05-05 (×3): qty 2

## 2014-05-05 MED ORDER — DILTIAZEM HCL ER COATED BEADS 240 MG PO CP24
240.0000 mg | ORAL_CAPSULE | Freq: Every day | ORAL | Status: DC
Start: 2014-05-05 — End: 2014-05-05
  Administered 2014-05-05: 240 mg via ORAL
  Filled 2014-05-05: qty 1

## 2014-05-05 NOTE — Progress Notes (Signed)
Pt is able to place on home CPAP when ready. Pt encouraged to call RT if needing any assistance. No distress noted at this time.

## 2014-05-05 NOTE — Progress Notes (Signed)
PROGRESS NOTE    Ronald [name] TJQ:[id number] DOB: December 29, 1945 DOA: 05/04/2014 PCP: [name], MD  HPI/Brief narrative 69 year old male patient with history of HTN, HLD, prostate cancer, recent sinusitis-treated with Augmentin, was scheduled for bilateral hydrocele surgery on 1/26 but was noted to have heart rate in the 150s and sent to ED for admission. A shunt with new onset atrial flutter with RVR.   Assessment/Plan:  1. Atrial flutter with RVR: Admitted to telemetry and was started on IV Cardizem drip and heparin. Cardiology was consulted. On 1/27, he was switched to diltiazem CD 240 MG daily, IV heparin was changed to Eliquis. Plan was to monitor heart rate in the afternoon and if controlled, DC home later on 1/27. However patient continued to have sustained tachycardia in the 110-120s at rest and in the 130s with activity. Discussed with cardiology who recommended an additional dose of Cardizem CD 120 mg 1 and increase Cardizem CD from 1/28 to 360 mg daily. Aspirin discontinued. TSH normal. Patient counseled regarding avoiding alcohol. As per cardiology, postpone elective procedure until situation cleared (likely 2 months). He may have to undergo arterial pressures and in 3 weeks if he remains in atrial flutter. 2-D echo: LVEF 50-55 percent. 2. Hypokalemia: Replace and follow BMP 3. Obesity: We will need diet and weight loss. 4. OSA: Nightly C Pap 5. Tobacco abuse: Cessation counseling done. 6. Dyspnea: Possibly from a flutter with RVR +/- CHF. Improved. Continue Lasix. May have some degree of COPD from tobacco abuse. Counseled outpatient follow-up with pulmonology for PFTs. 7. Essential hypertension: Controlled 8. Tobacco abuse: Cessation counseled 9. Bilateral hydrocele: Elective procedure will have to be postponed.   Code Status: Full Family Communication: Discussed with spouse at bedside. Disposition Plan: Home when medically  stable.   Consultants:  Cardiology  Procedures:  None  Antibiotics:  None   Subjective: Feels better. Mild intermittent dyspnea and nonproductive cough. No chest pain or palpitations.  Objective: Filed Vitals:   05/04/14 2154 05/05/14 0508 05/05/14 0839 05/05/14 0935  BP: 119/64 109/80 125/86   Pulse: 97 74    Temp: 98 F (36.7 C) 97.3 F (36.3 C)    TempSrc: Oral Oral    Resp: 18 18    Height:      Weight:      SpO2: 96% 92%  93%    Intake/Output Summary (Last 24 hours) at 05/05/14 1805 Last data filed at 05/05/14 1341  Gross per 24 hour  Intake    100 ml  Output   3500 ml  Net  -3400 ml   Filed Weights   05/04/14 1003  Weight: 102.513 kg (226 lb)     Exam:  General exam: Moderately built and obese middle-aged male sitting up comfortably in bed. Respiratory system: Clear. No increased work of breathing. Cardiovascular system: S1 & S2 heard, RRR. No JVD, murmurs, gallops, clicks or pedal edema. Telemetry this morning: Atrial flutter with controlled ventricular rate. Subsequently tachycardic intermittently. Gastrointestinal system: Abdomen is nondistended, soft and nontender. Normal bowel sounds heard. Central nervous system: Alert and oriented. No focal neurological deficits. Extremities: Symmetric 5 x 5 power.   Data Reviewed: Basic Metabolic Panel:  Recent Labs Lab 05/04/14 0839 05/04/14 1119 05/04/14 1454 05/05/14 0213  NA 142 140  --  143  K 4.1 4.1  --  3.1*  CL  --  108  --  106  CO2  --  24  --  28  GLUCOSE 112* 106*  --  112*  BUN  --  15  --  16  CREATININE  --  0.92  --  0.95  CALCIUM  --  8.7  --  8.5  MG  --   --  1.8  --    Liver Function Tests:  Recent Labs Lab 05/04/14 1119 05/05/14 0213  AST 27 18  ALT 28 22  ALKPHOS 70 67  BILITOT 0.4 0.8  PROT 5.8* 5.7*  ALBUMIN 3.5 3.5   No results for input(s): LIPASE, AMYLASE in the last 168 hours. No results for input(s): AMMONIA in the last 168 hours. CBC:  Recent  Labs Lab 05/04/14 0839 05/04/14 1119 05/05/14 0213  WBC  --  6.5 6.7  NEUTROABS  --  4.4  --   HGB 15.3 14.7 13.8  HCT 45.0 43.9 42.0  MCV  --  100.2* 100.0  PLT  --  208 226   Cardiac Enzymes:  Recent Labs Lab 05/04/14 1119 05/04/14 1453 05/05/14 0213  TROPONINI <0.03 <0.03 <0.03   BNP (last 3 results) No results for input(s): PROBNP in the last 8760 hours. CBG: No results for input(s): GLUCAP in the last 168 hours.  No results found for this or any previous visit (from the past 240 hour(s)).       Studies: Dg Chest 2 View  05/04/2014   CLINICAL DATA:  Tachycardia today, shortness of breath, hypertension, smoker, prostate cancer, had planned testicular surgery today which could not be performed due to tachycardia  EXAM: CHEST  2 VIEW  COMPARISON:  12/23/2003  FINDINGS: Enlargement of cardiac silhouette with pulmonary vascular congestion.  Interstitial prominence in perihilar regions extending into lung bases question pulmonary edema.  No segmental consolidation or pneumothorax.  Tiny BILATERAL pleural effusions.  No acute osseous findings.  IMPRESSION: Enlargement of cardiac silhouette with pulmonary vascular congestion and question minimal pulmonary edema.  Tiny bibasilar pleural effusions.   Electronically Signed   By: Lavonia Dana M.D.   On: 05/04/2014 12:14        Scheduled Meds: . apixaban  5 mg Oral BID  . atorvastatin  10 mg Oral Daily  . [START ON 05/06/2014] diltiazem  360 mg Oral Daily  . fluticasone  2 spray Each Nare Daily  . furosemide  40 mg Oral Daily  . levalbuterol  0.63 mg Nebulization BID  . sodium chloride  3 mL Intravenous Q12H  . sodium chloride  3 mL Intravenous Q12H   Continuous Infusions:   Principal Problem:   Atrial flutter with rapid ventricular response Active Problems:   Hyperlipidemia   Obstructive sleep apnea   Essential hypertension   Prediabetes   Atrial flutter   Cigarette smoker two packs a day or less    Time spent:  40 minutes.    Vernell Leep, MD, FACP, FHM. [facility] Pager [phone]  If 7PM-7AM, please contact night-coverage [email or web address] Password [email or web address] 05/05/2014, 6:05 PM    LOS: 1 day

## 2014-05-05 NOTE — Progress Notes (Signed)
Subjective:  Feels well. No SOB, no CP. Did not realize he was in flutter yesterday. Wife in room.   Objective:  Vital Signs in the last 24 hours: Temp:  [97.3 F (36.3 C)-98.2 F (36.8 C)] 97.3 F (36.3 C) (01/27 0508) Pulse Rate:  [57-154] 74 (01/27 0508) Resp:  [16-33] 18 (01/27 0508) BP: (105-135)/(62-115) 109/80 mmHg (01/27 0508) SpO2:  [81 %-97 %] 92 % (01/27 0508) Weight:  [225 lb (102.059 kg)-226 lb (102.513 kg)] 226 lb (102.513 kg) (01/26 1003)  Intake/Output from previous day: 01/26 0701 - 01/27 0700 In: 200 [I.V.:200] Out: 2200 [Urine:2200]   Physical Exam: General: Well developed, well nourished, in no acute distress. Head:  Normocephalic and atraumatic. Lungs: Clear to auscultation and percussion. Heart: Normal S1 and S2.  No murmur, rubs or gallops.  Abdomen: soft, non-tender, positive bowel sounds. Overweight Extremities: No clubbing or cyanosis. No edema. Neurologic: Alert and oriented x 3.    Lab Results:  Recent Labs  05/04/14 1119 05/05/14 0213  WBC 6.5 6.7  HGB 14.7 13.8  PLT 208 226    Recent Labs  05/04/14 1119 05/05/14 0213  NA 140 143  K 4.1 3.1*  CL 108 106  CO2 24 28  GLUCOSE 106* 112*  BUN 15 16  CREATININE 0.92 0.95    Recent Labs  05/04/14 1453 05/05/14 0213  TROPONINI <0.03 <0.03   Hepatic Function Panel  Recent Labs  05/05/14 0213  PROT 5.7*  ALBUMIN 3.5  AST 18  ALT 22  ALKPHOS 61  BILITOT 0.8    Imaging: Dg Chest 2 View  05/04/2014   CLINICAL DATA:  Tachycardia today, shortness of breath, hypertension, smoker, prostate cancer, had planned testicular surgery today which could not be performed due to tachycardia  EXAM: CHEST  2 VIEW  COMPARISON:  12/23/2003  FINDINGS: Enlargement of cardiac silhouette with pulmonary vascular congestion.  Interstitial prominence in perihilar regions extending into lung bases question pulmonary edema.  No segmental consolidation or pneumothorax.  Tiny BILATERAL pleural  effusions.  No acute osseous findings.  IMPRESSION: Enlargement of cardiac silhouette with pulmonary vascular congestion and question minimal pulmonary edema.  Tiny bibasilar pleural effusions.   Electronically Signed   By: Lavonia Dana M.D.   On: 05/04/2014 12:14   Personally viewed.   Telemetry: Aflutter 70-80 Personally viewed.  Cardiac Studies:  EF50%,moderate LA enlargement  Assessment/Plan:  Principal Problem:   Atrial flutter with rapid ventricular response Active Problems:   Hyperlipidemia   Obstructive sleep apnea   Essential hypertension   Prediabetes   Atrial flutter   Cigarette smoker two packs a day or less   69 year old with urology procedure cancelled 05/04/14 secondary to aflutter with RVR.  Aflutter  - Will place on Eliquis. Discussed bleeding risks. Stroke prevention. Stop ASA.   - Diltiazem for rate control. Change to oral 240 CD. Stop IV. Watch this afternoon. If continues to show good control, ok to DC home later today on dilt and Eliquis.    - EF low normal - reassuring  - Trop normal. TSH normal.   - Avoid ETOH  - Asymptomatic. Weight gain noted. OK with lasix with KCL.   - postpone elective procedure until situation cleared. (likely 2 months).   Hypokalemia  - replete  Obesity  - weight loss  OSA  - treat. Hand in hand with flutter  Smoking cessation  Will get follow up with [name] in 1-2 weeks. If still in flutter, plan  cardioversion in 3 weeks.    Idil Maslanka, [place] 05/05/2014, 6:43 AM

## 2014-05-05 NOTE — Progress Notes (Signed)
ANTICOAGULATION CONSULT NOTE - Follow Up Consult  Pharmacy Consult for Heparin Indication: Aflutter with RVR  No Known Allergies  Patient Measurements: Height: 5\' 11"  (180.3 cm) Weight: 226 lb (102.513 kg) IBW/kg (Calculated) : 75.3  Vital Signs: Temp: 97.3 F (36.3 C) (01/27 0508) Temp Source: Oral (01/27 0508) BP: 109/80 mmHg (01/27 0508) Pulse Rate: 74 (01/27 0508)  Labs:  Recent Labs  05/04/14 0839 05/04/14 1119 05/04/14 1453 05/04/14 1822 05/05/14 0026 05/05/14 0213  HGB 15.3 14.7  --   --   --  13.8  HCT 45.0 43.9  --   --   --  42.0  PLT  --  208  --   --   --  226  APTT  --   --   --  27  --   --   LABPROT  --   --   --  14.3  --   --   INR  --   --   --  1.10  --   --   HEPARINUNFRC  --   --   --   --  0.22*  --   CREATININE  --  0.92  --   --   --  0.95  TROPONINI  --  <0.03 <0.03  --   --  <0.03    Estimated Creatinine Clearance: 90.7 mL/min (by C-G formula based on Cr of 0.95).   Medications:  Infusions:  . diltiazem (CARDIZEM) infusion 15 mg/hr (05/05/14 0335)  . heparin 1,550 Units/hr (05/05/14 0223)    Assessment: 69 yr male with planned urology procedure found to be tachycardic and sent to ED. H/O HTN, hyperlipidemia, prostate cancer. Cardiology noted the pt to be in Atrial flutter with RVR. Pharmacy consulted to dose IV heparin with change to PO Eliquis on 1/27 per Cardiology.  Today, 05/05/2014:  Heparin level 0.44, therapeutic on heparin at 15.5 ml/hr  CBC: Hgb and Plt stable/WNL  SCr 0.95, CrCl ~ 90 ml/min   Goal of Therapy:  Heparin level 0.3-0.7 units/ml Monitor platelets by anticoagulation protocol: Yes   Plan:   D/C Heparin drip at 1000  Give first dose of Eliquis at 1000  Communicated plan with RN Gregary Signs, who agrees.   Gretta Arab PharmD, BCPS Pager [phone] 05/05/2014 7:06 AM

## 2014-05-05 NOTE — Progress Notes (Signed)
Pt HR sustaining 130s when ambulating.  MD notified.

## 2014-05-05 NOTE — Progress Notes (Signed)
Pt up ambulating during the shift--during ambulation HR 120's-130's. Pt resting lying in bed HR 98 will continue to monitor. SRP, RN

## 2014-05-05 NOTE — Discharge Instructions (Addendum)
Information on my medicine - ELIQUIS (apixaban)  This medication education was reviewed with me or my healthcare representative as part of my discharge preparation.  The pharmacist that spoke with me during my hospital stay was:  Leeroy Bock, [facility]  Why was Eliquis prescribed for you? Eliquis was prescribed for you to reduce the risk of a blood clot forming that can cause a stroke if you have a medical condition called atrial fibrillation (a type of irregular heartbeat).  What do You need to know about Eliquis ? Take your Eliquis TWICE DAILY - one tablet in the morning and one tablet in the evening with or without food. If you have difficulty swallowing the tablet whole please discuss with your pharmacist how to take the medication safely.  Take Eliquis exactly as prescribed by your doctor and DO NOT stop taking Eliquis without talking to the doctor who prescribed the medication.  Stopping may increase your risk of developing a stroke.  Refill your prescription before you run out.  After discharge, you should have regular check-up appointments with your healthcare provider that is prescribing your Eliquis.  In the future your dose may need to be changed if your kidney function or weight changes by a significant amount or as you get older.  What do you do if you miss a dose? If you miss a dose, take it as soon as you remember on the same day and resume taking twice daily.  Do not take more than one dose of ELIQUIS at the same time to make up a missed dose.  Important Safety Information A possible side effect of Eliquis is bleeding. You should call your healthcare provider right away if you experience any of the following: ? Bleeding from an injury or your nose that does not stop. ? Unusual colored urine (red or dark brown) or unusual colored stools (red or black). ? Unusual bruising for unknown reasons. ? A serious fall or if you hit your head (even if there is no  bleeding).  Some medicines may interact with Eliquis and might increase your risk of bleeding or clotting while on Eliquis. To help avoid this, consult your healthcare provider or pharmacist prior to using any new prescription or non-prescription medications, including herbals, vitamins, non-steroidal anti-inflammatory drugs (NSAIDs) and supplements.  This website has more information on Eliquis (apixaban): http://www.eliquis.com/eliquis/home   Atrial Flutter Atrial flutter is a heart rhythm that can cause the heart to beat very fast (tachycardia). It originates in the upper chambers of the heart (atria). In atrial flutter, the top chambers of the heart (atria) often beat much faster than the bottom chambers of the heart (ventricles). Atrial flutter has a regular "saw toothed" appearance in an EKG readout. An EKG is a test that records the electrical activity of the heart. Atrial flutter can cause the heart to beat up to 150 beats per minute (BPM). Atrial flutter can either be short lived (paroxysmal) or permanent.  CAUSES  Causes of atrial flutter can be many. Some of these include:  Heart related issues:  Heart attack (myocardial infarction).  Heart failure.  Heart valve problems.  Poorly controlled high blood pressure (hypertension).  After open heart surgery.  Lung related issues:  A blood clot in the lungs (pulmonary embolism).  Chronic obstructive pulmonary disease (COPD). Medications used to treat COPD can attribute to atrial flutter.  Other related causes:  Hyperthyroidism.  Caffeine.  Some decongestant cold medications.  Low electrolyte levels such as potassium or magnesium.  Cocaine. SYMPTOMS  An awareness of your heart beating rapidly (palpitations).  Shortness of breath.  Chest pain.  Low blood pressure (hypotension).  Dizziness or fainting. DIAGNOSIS  Different tests can be performed to diagnose atrial flutter.   An EKG.  Holter monitor. This  is a 24-hour recording of your heart rhythm. You will also be given a diary. Write down all symptoms that you have and what you were doing at the time you experienced symptoms.  Cardiac event monitor. This small device can be worn for up to 30 days. When you have heart symptoms, you will push a button on the device. This will then record your heart rhythm.  Echocardiogram. This is an imaging test to look at your heart. Your caregiver will look at your heart valves and the ventricles.  Stress test. This test can help determine if the atrial flutter is related to exercise or if coronary artery disease is present.  Laboratory studies will look at certain blood levels like:  Complete blood count (CBC).  Potassium.  Magnesium.  Thyroid function. TREATMENT  Treatment of atrial flutter varies. A combination of therapies may be used or sometimes atrial flutter may need only 1 type of treatment.  Lab work: If your blood work, such as your electrolytes (potassium, magnesium) or your thyroid function tests, are abnormal, your caregiver will treat them accordingly.  Medication:  There are several different types of medications that can convert your heart to a normal rhythm and prevent atrial flutter from reoccurring.  Nonsurgical procedures: Nonsurgical techniques may be used to control atrial flutter. Some examples include:  Cardioversion. This technique uses either drugs or an electrical shock to restore a normal heart rhythm:  Cardioversion drugs may be given through an intravenous (IV) line to help "reset" the heart rhythm.  In electrical cardioversion, your caregiver shocks your heart with electrical energy. This helps to reset the heartbeat to a normal rhythm.  Ablation. If atrial flutter is a persistent problem, an ablation may be needed. This procedure is done under mild sedation. High frequency radio-wave energy is used to destroy the area of heart tissue responsible for atrial  flutter. SEEK IMMEDIATE MEDICAL CARE IF:  You have:  Dizziness.  Near fainting or fainting.  Shortness of breath.  Chest pain or pressure.  Sudden nausea or vomiting.  Profuse sweating. If you have the above symptoms, call your local emergency service immediately! Do not drive yourself to the hospital. MAKE SURE YOU:   Understand these instructions.  Will watch your condition.  Will get help right away if you are not doing well or get worse. Document Released: 08/12/2008 Document Revised: 08/10/2013 Document Reviewed: 08/12/2008 [facility] Patient Information 2015 [place], [place]. This information is not intended to replace advice given to you by your health care provider. Make sure you discuss any questions you have with your health care provider.

## 2014-05-05 NOTE — Progress Notes (Signed)
PER [facility] ELIQUIS IS $98.83 LOCAL PHARMACY, MAIL ORDER IS 80.00 FOR 3 MONTHS SUPPLE AND XARELTO IS $84.25 AT LOCAL PHARMACY AND MAIL ORDER IS 80.00 FOR 3 MONTHS SUPPLY. MAIL IN INFORMATION [phone].

## 2014-05-05 NOTE — Progress Notes (Signed)
ANTICOAGULATION CONSULT NOTE - Follow Up Consult  Pharmacy Consult for Heparin Indication: aflutter with RVR  No Known Allergies  Patient Measurements: Height: 5\' 11"  (180.3 cm) Weight: 226 lb (102.513 kg) IBW/kg (Calculated) : 75.3 Heparin Dosing Weight:   Vital Signs: Temp: 98 F (36.7 C) (01/26 2154) Temp Source: Oral (01/26 2154) BP: 119/64 mmHg (01/26 2154) Pulse Rate: 97 (01/26 2154)  Labs:  Recent Labs  05/04/14 0839 05/04/14 1119 05/04/14 1453 05/04/14 1822 05/05/14 0026  HGB 15.3 14.7  --   --   --   HCT 45.0 43.9  --   --   --   PLT  --  208  --   --   --   APTT  --   --   --  27  --   LABPROT  --   --   --  14.3  --   INR  --   --   --  1.10  --   HEPARINUNFRC  --   --   --   --  0.22*  CREATININE  --  0.92  --   --   --   TROPONINI  --  <0.03 <0.03  --   --     Estimated Creatinine Clearance: 93.7 mL/min (by C-G formula based on Cr of 0.92).   Medications:  Infusions:  . diltiazem (CARDIZEM) infusion 15 mg/hr (05/04/14 2048)  . heparin      Assessment: Patient with low heparin level.  No issues per RN.  Goal of Therapy:  Heparin level 0.3-0.7 units/ml Monitor platelets by anticoagulation protocol: Yes   Plan:  Increase heparin to 1550 units/hr Recheck level at 0800  Tyler Deis, [place] 05/05/2014,1:25 AM

## 2014-05-06 DIAGNOSIS — E876 Hypokalemia: Secondary | ICD-10-CM

## 2014-05-06 LAB — BASIC METABOLIC PANEL
ANION GAP: 8 (ref 5–15)
BUN: 12 mg/dL (ref 6–23)
CO2: 26 mmol/L (ref 19–32)
Calcium: 8.8 mg/dL (ref 8.4–10.5)
Chloride: 105 mmol/L (ref 96–112)
Creatinine, Ser: 0.92 mg/dL (ref 0.50–1.35)
GFR calc Af Amer: >90 mL/min (ref >90)
GFR calc non Af Amer: 85 mL/min — ABNORMAL LOW (ref >90)
Glucose, Bld: 118 mg/dL — ABNORMAL HIGH (ref 70–99)
POTASSIUM: 3.7 mmol/L (ref 3.5–5.1)
Sodium: 139 mmol/L (ref 135–145)

## 2014-05-06 LAB — CBC
HCT: 42.5 % (ref 39.0–52.0)
HEMOGLOBIN: 13.7 g/dL (ref 13.0–17.0)
MCH: 32.5 pg (ref 26.0–34.0)
MCHC: 32.2 g/dL (ref 30.0–36.0)
MCV: 100.7 fL — AB (ref 78.0–100.0)
PLATELETS: 209 10*3/uL (ref 150–400)
RBC: 4.22 MIL/uL (ref 4.22–5.81)
RDW: 13.5 % (ref 11.5–15.5)
WBC: 6 10*3/uL (ref 4.0–10.5)

## 2014-05-06 MED ORDER — POTASSIUM CHLORIDE CRYS ER 20 MEQ PO TBCR
40.0000 meq | EXTENDED_RELEASE_TABLET | Freq: Once | ORAL | Status: AC
  Administered 2014-05-06: 40 meq via ORAL
  Filled 2014-05-06: qty 2

## 2014-05-06 MED ORDER — AMOXICILLIN-POT CLAVULANATE 875-125 MG PO TABS
1.0000 | ORAL_TABLET | Freq: Two times a day (BID) | ORAL | Status: DC
Start: 2014-05-06 — End: 2014-05-08
  Administered 2014-05-06 – 2014-05-08 (×5): 1 via ORAL
  Filled 2014-05-06 (×6): qty 1

## 2014-05-06 NOTE — Progress Notes (Signed)
Pt states that he will self administer his home CPAP when ready for bed.  RT to monitor and assess as needed.

## 2014-05-06 NOTE — Progress Notes (Addendum)
Subjective:  Mild SOB, no CP. Did not realize he was in flutter  Wife in room. Still with difficultly controlling rate. At night 90's. When awake 120-140.   Objective:  Vital Signs in the last 24 hours: Temp:  [97.9 F (36.6 C)] 97.9 F (36.6 C) (01/27 2319) Pulse Rate:  [80] 80 (01/27 2319) Resp:  [18] 18 (01/27 2319) BP: (125-129)/(81-86) 129/81 mmHg (01/27 2319) SpO2:  [93 %-96 %] 96 % (01/27 2319)  Intake/Output from previous day: 01/27 0701 - 01/28 0700 In: -  Out: 850 [Urine:850]   Physical Exam: General: Well developed, well nourished, in no acute distress. Head:  Normocephalic and atraumatic. Lungs: Clear to auscultation and percussion. Heart: Normal S1 and S2, irreg tachy.  No murmur, rubs or gallops.  Abdomen: soft, non-tender, positive bowel sounds. Overweight Extremities: No clubbing or cyanosis. No edema. Neurologic: Alert and oriented x 3.    Lab Results:  Recent Labs  05/05/14 0213 05/06/14 0508  WBC 6.7 6.0  HGB 13.8 13.7  PLT 226 209    Recent Labs  05/05/14 0213 05/06/14 0508  NA 143 139  K 3.1* 3.7  CL 106 105  CO2 28 26  GLUCOSE 112* 118*  BUN 16 12  CREATININE 0.95 0.92    Recent Labs  05/04/14 1453 05/05/14 0213  TROPONINI <0.03 <0.03   Hepatic Function Panel  Recent Labs  05/05/14 0213  PROT 5.7*  ALBUMIN 3.5  AST 18  ALT 22  ALKPHOS 56  BILITOT 0.8    Imaging: Dg Chest 2 View  05/04/2014   CLINICAL DATA:  Tachycardia today, shortness of breath, hypertension, smoker, prostate cancer, had planned testicular surgery today which could not be performed due to tachycardia  EXAM: CHEST  2 VIEW  COMPARISON:  12/23/2003  FINDINGS: Enlargement of cardiac silhouette with pulmonary vascular congestion.  Interstitial prominence in perihilar regions extending into lung bases question pulmonary edema.  No segmental consolidation or pneumothorax.  Tiny BILATERAL pleural effusions.  No acute osseous findings.  IMPRESSION:  Enlargement of cardiac silhouette with pulmonary vascular congestion and question minimal pulmonary edema.  Tiny bibasilar pleural effusions.   Electronically Signed   By: Lavonia Dana M.D.   On: 05/04/2014 12:14   Personally viewed.   Telemetry: Aflutter 70-80 currently when up 120's Personally viewed.  Cardiac Studies:  EF50%,moderate LA enlargement  Assessment/Plan:  Principal Problem:   Atrial flutter with rapid ventricular response Active Problems:   Hyperlipidemia   Obstructive sleep apnea   Essential hypertension   Prediabetes   Atrial flutter   Cigarette smoker two packs a day or less   Hypokalemia   Tobacco abuse   69 year old with urology procedure cancelled 05/04/14 secondary to aflutter with RVR.  Aflutter  - on Eliquis. Discussed bleeding risks. Stroke prevention. Stop ASA. Still with mild SOB.   - Diltiazem for rate control (IV did good job when in bed but increases when awake (got 240 and 120 yesterday) ). Changed to oral 360 CD.    - Because we are still battling with rate control which is often an issue with atrial flutter, I will set him up for cardioversion tomorrow at [facility] with TEE. Discussed risks and benefits (stroke, bleeding, esophageal damage.Marland Kitchen). This will allow for 5 doses of Eliquis.  - If cardioversion is successful tomorrow, will anticipate DC tomorrow afternoon.   - Will make NPO past midnight.   - EF low normal - reassuring  - Trop normal. TSH normal.   -  Avoid ETOH  - Asymptomatic. Weight gain noted. OK with lasix with KCL.   - postpone elective procedure until situation cleared. (likely 2 months).   Hypokalemia  - replete again today.  Obesity  - weight loss  OSA  - treat. Hand in hand with flutter  Lasix 40.   Smoking cessation     SKAINS, MARK 05/06/2014, 6:31 AM

## 2014-05-06 NOTE — Progress Notes (Signed)
PROGRESS NOTE    Ronald [name] FUX:[id number] DOB: 1945/09/12 DOA: 05/04/2014 PCP: [name], MD  HPI/Brief narrative 69 year old male patient with history of HTN, HLD, prostate cancer, recent sinusitis-treated with Augmentin, was scheduled for bilateral hydrocele surgery on 1/26 but was noted to have heart rate in the 150s and sent to ED for admission. A shunt with new onset atrial flutter with RVR.   Assessment/Plan:  1. Atrial flutter with RVR: Admitted to telemetry and was started on IV Cardizem drip and heparin. Cardiology was consulted. On 1/27, he was switched to diltiazem CD 240 MG daily, IV heparin was changed to Eliquis. Plan was to monitor heart rate in the afternoon and if controlled, DC home later on 1/27. However patient continued to have sustained tachycardia in the 110-120s at rest and in the 130s with activity. Cardizem dose was increased. Aspirin discontinued. TSH normal. Patient counseled regarding avoiding alcohol. As per cardiology, postpone elective procedure until situation cleared (likely 2 months). Patient continues to have tachycardia, mostly with activity. As per cardiology, plan for TEE and cardioversion on 1/29. 2. Hypokalemia: Replace and follow BMP 3. Obesity: We will need diet and weight loss. 4. OSA: Nightly C Pap 5. Tobacco abuse: Cessation counseling done. 6. Dyspnea: Possibly from a flutter with RVR +/- CHF. Improved. Continue Lasix. May have some degree of COPD from tobacco abuse. Counseled outpatient follow-up with pulmonology for PFTs. 7. Essential hypertension: Controlled 8. Bilateral hydrocele: Elective procedure will have to be postponed. 9. Sinusitis: Complete course of Augmentin.   Code Status: Full Family Communication: Discussed with spouse at bedside. Disposition Plan: Home when medically stable.   Consultants:  Cardiology  Procedures:  None  Antibiotics:  Augmentin  Subjective: Denies  complaints  Objective: Filed Vitals:   05/05/14 2319 05/06/14 0639 05/06/14 0927 05/06/14 1357  BP: 129/81 126/77  118/74  Pulse: 80 98  64  Temp: 97.9 F (36.6 C) 97.5 F (36.4 C)  97.8 F (36.6 C)  TempSrc: Oral Oral  Oral  Resp: 18 18  18   Height:      Weight:      SpO2: 96% 95% 95% 94%    Intake/Output Summary (Last 24 hours) at 05/06/14 1631 Last data filed at 05/06/14 0858  Gross per 24 hour  Intake    360 ml  Output      0 ml  Net    360 ml   Filed Weights   05/04/14 1003  Weight: 102.513 kg (226 lb)     Exam:  General exam: Moderately built and obese middle-aged male sitting up comfortably in bed. Respiratory system: Clear. No increased work of breathing. Cardiovascular system: S1 & S2 heard, RRR. No JVD, murmurs, gallops, clicks or pedal edema. Telemetry this morning: Atrial flutter with intermittent RVR in the 130s, especially with activity.  Gastrointestinal system: Abdomen is nondistended, soft and nontender. Normal bowel sounds heard. Central nervous system: Alert and oriented. No focal neurological deficits. Extremities: Symmetric 5 x 5 power.   Data Reviewed: Basic Metabolic Panel:  Recent Labs Lab 05/04/14 0839 05/04/14 1119 05/04/14 1454 05/05/14 0213 05/06/14 0508  NA 142 140  --  143 139  K 4.1 4.1  --  3.1* 3.7  CL  --  108  --  106 105  CO2  --  24  --  28 26  GLUCOSE 112* 106*  --  112* 118*  BUN  --  15  --  16 12  CREATININE  --  0.92  --  0.95 0.92  CALCIUM  --  8.7  --  8.5 8.8  MG  --   --  1.8  --   --    Liver Function Tests:  Recent Labs Lab 05/04/14 1119 05/05/14 0213  AST 27 18  ALT 28 22  ALKPHOS 70 67  BILITOT 0.4 0.8  PROT 5.8* 5.7*  ALBUMIN 3.5 3.5   No results for input(s): LIPASE, AMYLASE in the last 168 hours. No results for input(s): AMMONIA in the last 168 hours. CBC:  Recent Labs Lab 05/04/14 0839 05/04/14 1119 05/05/14 0213 05/06/14 0508  WBC  --  6.5 6.7 6.0  NEUTROABS  --  4.4  --   --    HGB 15.3 14.7 13.8 13.7  HCT 45.0 43.9 42.0 42.5  MCV  --  100.2* 100.0 100.7*  PLT  --  208 226 209   Cardiac Enzymes:  Recent Labs Lab 05/04/14 1119 05/04/14 1453 05/05/14 0213  TROPONINI <0.03 <0.03 <0.03   BNP (last 3 results) No results for input(s): PROBNP in the last 8760 hours. CBG: No results for input(s): GLUCAP in the last 168 hours.  No results found for this or any previous visit (from the past 240 hour(s)).       Studies: No results found.      Scheduled Meds: . amoxicillin-clavulanate  1 tablet Oral Q12H  . apixaban  5 mg Oral BID  . atorvastatin  10 mg Oral Daily  . diltiazem  360 mg Oral Daily  . fluticasone  2 spray Each Nare Daily  . furosemide  40 mg Oral Daily  . levalbuterol  0.63 mg Nebulization BID  . sodium chloride  3 mL Intravenous Q12H  . sodium chloride  3 mL Intravenous Q12H   Continuous Infusions:   Principal Problem:   Atrial flutter with rapid ventricular response Active Problems:   Hyperlipidemia   Obstructive sleep apnea   Essential hypertension   Prediabetes   Atrial flutter   Cigarette smoker two packs a day or less   Hypokalemia   Tobacco abuse    Time spent: 20 minutes.    Vernell Leep, MD, FACP, FHM. [facility] Pager [phone]  If 7PM-7AM, please contact night-coverage [email or web address] Password [email or web address] 05/06/2014, 4:31 PM    LOS: 2 days

## 2014-05-07 ENCOUNTER — Other Ambulatory Visit: Payer: Self-pay

## 2014-05-07 ENCOUNTER — Encounter (HOSPITAL_COMMUNITY)
Admission: EM | Disposition: A | Discharge status: Home / Self Care | Payer: Self-pay | Source: Home / Self Care | Attending: Internal Medicine

## 2014-05-07 ENCOUNTER — Encounter (HOSPITAL_COMMUNITY): Payer: Self-pay | Admitting: *Deleted

## 2014-05-07 ENCOUNTER — Ambulatory Visit (HOSPITAL_COMMUNITY)
Admission: RE | Admit: 2014-05-07 | Payer: Federal, State, Local not specified - PPO | Source: Ambulatory Visit | Admitting: Internal Medicine

## 2014-05-07 ENCOUNTER — Inpatient Hospital Stay (HOSPITAL_COMMUNITY): Payer: Federal, State, Local not specified - PPO | Admitting: Anesthesiology

## 2014-05-07 DIAGNOSIS — Z720 Tobacco use: Secondary | ICD-10-CM

## 2014-05-07 HISTORY — PX: TEE WITHOUT CARDIOVERSION: SHX5443

## 2014-05-07 HISTORY — PX: CARDIOVERSION: SHX1299

## 2014-05-07 LAB — BASIC METABOLIC PANEL
ANION GAP: 8 (ref 5–15)
BUN: 14 mg/dL (ref 6–23)
CHLORIDE: 105 mmol/L (ref 96–112)
CO2: 29 mmol/L (ref 19–32)
Calcium: 9 mg/dL (ref 8.4–10.5)
Creatinine, Ser: 1.05 mg/dL (ref 0.50–1.35)
GFR calc Af Amer: 82 mL/min — ABNORMAL LOW (ref >90)
GFR calc non Af Amer: 71 mL/min — ABNORMAL LOW (ref >90)
GLUCOSE: 126 mg/dL — AB (ref 70–99)
Potassium: 4.4 mmol/L (ref 3.5–5.1)
Sodium: 142 mmol/L (ref 135–145)

## 2014-05-07 LAB — CBC
HCT: 43.1 % (ref 39.0–52.0)
Hemoglobin: 13.7 g/dL (ref 13.0–17.0)
MCH: 32.1 pg (ref 26.0–34.0)
MCHC: 31.8 g/dL (ref 30.0–36.0)
MCV: 100.9 fL — ABNORMAL HIGH (ref 78.0–100.0)
Platelets: 192 10*3/uL (ref 150–400)
RBC: 4.27 MIL/uL (ref 4.22–5.81)
RDW: 13.4 % (ref 11.5–15.5)
WBC: 7 10*3/uL (ref 4.0–10.5)

## 2014-05-07 SURGERY — ECHOCARDIOGRAM, TRANSESOPHAGEAL
Anesthesia: General

## 2014-05-07 MED ORDER — PROPOFOL INFUSION 10 MG/ML OPTIME
INTRAVENOUS | Status: DC | PRN
  Administered 2014-05-07: 100 ug/kg/min via INTRAVENOUS

## 2014-05-07 MED ORDER — METOPROLOL TARTRATE 25 MG PO TABS
12.5000 mg | ORAL_TABLET | Freq: Two times a day (BID) | ORAL | Status: DC
  Administered 2014-05-07 – 2014-05-08 (×2): 12.5 mg via ORAL
  Filled 2014-05-07 (×2): qty 1

## 2014-05-07 MED ORDER — FENTANYL CITRATE 0.05 MG/ML IJ SOLN
INTRAMUSCULAR | Status: DC | PRN
  Administered 2014-05-07: 100 ug via INTRAVENOUS

## 2014-05-07 MED ORDER — LIDOCAINE VISCOUS 2 % MT SOLN
OROMUCOSAL | Status: DC | PRN
  Administered 2014-05-07: 1 via OROMUCOSAL

## 2014-05-07 MED ORDER — PROPOFOL INFUSION 10 MG/ML OPTIME
INTRAVENOUS | Status: DC | PRN
  Administered 2014-05-07: 25 mL via INTRAVENOUS

## 2014-05-07 MED ORDER — SODIUM CHLORIDE 0.9 % IV SOLN: INTRAVENOUS | Status: DC

## 2014-05-07 NOTE — Interval H&P Note (Signed)
History and Physical Interval Note:  05/07/2014 11:05 AM  Ronald [name]  has presented today for surgery, with the diagnosis of AFIB  The various methods of treatment have been discussed with the patient and family. After consideration of risks, benefits and other options for treatment, the patient has consented to  Procedure(s): TRANSESOPHAGEAL ECHOCARDIOGRAM (TEE) (N/A) CARDIOVERSION (N/A) as a surgical intervention .  The patient's history has been reviewed, patient examined, no change in status, stable for surgery.  I have reviewed the patient's chart and labs.  Questions were answered to the patient's satisfaction.     Dorris Carnes

## 2014-05-07 NOTE — Anesthesia Postprocedure Evaluation (Signed)
  Anesthesia Post-op Note  Patient: Ronald [name]  Procedure(s) Performed: Procedure(s) (LRB): TRANSESOPHAGEAL ECHOCARDIOGRAM (TEE) (N/A) CARDIOVERSION (N/A)  Patient Location: PACU  Anesthesia Type: General  Level of Consciousness: awake and alert   Airway and Oxygen Therapy: Patient Spontanous Breathing  Post-op Pain: mild  Post-op Assessment: Post-op Vital signs reviewed, Patient's Cardiovascular Status Stable, Respiratory Function Stable, Patent Airway and No signs of Nausea or vomiting  Last Vitals:  Filed Vitals:   05/07/14 1449  BP:   Pulse:   Temp: 36.6 C  Resp:     Post-op Vital Signs: stable   Complications: No apparent anesthesia complications

## 2014-05-07 NOTE — Progress Notes (Signed)
Pt. returned from [facility] via ambulance. Extremely irate re: amount of time he had to wait to be transported back here after his procedure. Refusing to have V/S taken and telemetry leads applied "until I take 10 minutes and talk to my wife". Dr. Algis Liming made aware and states he will speak to patient via T/C. Lind Guest, RN

## 2014-05-07 NOTE — Op Note (Signed)
LA, LA appendage without masses AV mildly thickened  Tr AI MV nromal  Mild MR TV normal Mild TR PV normal  Trace TR Diffult to accurately assess LVEF due to rapid rate Mild atherosclerotic plaquing of aorta

## 2014-05-07 NOTE — Progress Notes (Signed)
PROGRESS NOTE    Ronald [name] FOY:[id number] DOB: Dec 26, 1945 DOA: 05/04/2014 PCP: [name], MD  HPI/Brief narrative 69 year old male patient with history of HTN, HLD, prostate cancer, recent sinusitis-treated with Augmentin, was scheduled for bilateral hydrocele surgery on 1/26 but was noted to have heart rate in the 150s and sent to ED for admission. A shunt with new onset atrial flutter with RVR.   Assessment/Plan:  1. Atrial flutter with RVR: Initially started on IV Cardizem drip and heparin. Cardiology was consulted. On 1/27, he was switched to diltiazem CD 240 MG daily, IV heparin was changed to Eliquis. Aspirin discontinued. TSH normal. Patient counseled regarding avoiding alcohol. Despite increasing her Cardizem dose to 360, patient continued to be tachycardic both at more so with activity. Thereby, patient underwent TEE and cardioversion 1/29. As discussed with Dr. Harrington Challenger, low-dose metoprolol added for frequent PVCs and reasonable to monitor overnight and discharge in a.m. with as needed medication changes. 2. Hypokalemia: Replaced 3. Obesity: He will need diet and weight loss. 4. OSA: Nightly C Pap 5. Tobacco abuse: Cessation counseling done. 6. Dyspnea: Possibly from a flutter with RVR +/- CHF. Improved. Continue Lasix. May have some degree of COPD from tobacco abuse. Counseled outpatient follow-up with pulmonology for PFTs. 7. Essential hypertension: Controlled 8. Bilateral hydrocele: Elective procedure will have to be postponed. 9. Sinusitis: Complete course of Augmentin.   Code Status: Full Family Communication: Discussed with spouse at bedside. Disposition Plan: Home when medically stable-possibly 1/30.   Consultants:  Cardiology  Procedures:  TEE 05/07/14: LA, LA appendage without masses AV mildly thickened Tr AI MV nromal Mild MR TV normal Mild TR PV normal Trace TR Diffult to accurately assess LVEF due to rapid rate Mild atherosclerotic  plaquing of aorta    Cardioversion 05/07/14  Antibiotics:  Augmentin  Subjective: Patient was seen this morning prior to TEE/cardioversion. Denied complaints.  Objective: Filed Vitals:   05/07/14 1041 05/07/14 1229 05/07/14 1448 05/07/14 1449  BP: 128/66 151/92 133/97   Pulse: 81 117 83   Temp: 97.5 F (36.4 C) 97.9 F (36.6 C)  97.9 F (36.6 C)  TempSrc: Oral Oral    Resp: 16 22 19    Height:      Weight:      SpO2: 100% 95% 94%     Intake/Output Summary (Last 24 hours) at 05/07/14 1605 Last data filed at 05/07/14 1445  Gross per 24 hour  Intake    610 ml  Output      0 ml  Net    610 ml   Filed Weights   05/04/14 1003  Weight: 102.513 kg (226 lb)     Exam:  General exam: Moderately built and obese middle-aged male sitting up comfortably in bed. Respiratory system: Clear. No increased work of breathing. Cardiovascular system: S1 & S2 heard, RRR. No JVD, murmurs, gallops, clicks or pedal edema. Telemetry this morning: Atrial flutter with CVR.  Gastrointestinal system: Abdomen is nondistended, soft and nontender. Normal bowel sounds heard. Central nervous system: Alert and oriented. No focal neurological deficits. Extremities: Symmetric 5 x 5 power.   Data Reviewed: Basic Metabolic Panel:  Recent Labs Lab 05/04/14 0839 05/04/14 1119 05/04/14 1454 05/05/14 0213 05/06/14 0508 05/07/14 0500  NA 142 140  --  143 139 142  K 4.1 4.1  --  3.1* 3.7 4.4  CL  --  108  --  106 105 105  CO2  --  24  --  28 26 29   GLUCOSE  112* 106*  --  112* 118* 126*  BUN  --  15  --  16 12 14   CREATININE  --  0.92  --  0.95 0.92 1.05  CALCIUM  --  8.7  --  8.5 8.8 9.0  MG  --   --  1.8  --   --   --    Liver Function Tests:  Recent Labs Lab 05/04/14 1119 05/05/14 0213  AST 27 18  ALT 28 22  ALKPHOS 70 67  BILITOT 0.4 0.8  PROT 5.8* 5.7*  ALBUMIN 3.5 3.5   No results for input(s): LIPASE, AMYLASE in the last 168 hours. No results for input(s): AMMONIA in the  last 168 hours. CBC:  Recent Labs Lab 05/04/14 0839 05/04/14 1119 05/05/14 0213 05/06/14 0508 05/07/14 0500  WBC  --  6.5 6.7 6.0 7.0  NEUTROABS  --  4.4  --   --   --   HGB 15.3 14.7 13.8 13.7 13.7  HCT 45.0 43.9 42.0 42.5 43.1  MCV  --  100.2* 100.0 100.7* 100.9*  PLT  --  208 226 209 192   Cardiac Enzymes:  Recent Labs Lab 05/04/14 1119 05/04/14 1453 05/05/14 0213  TROPONINI <0.03 <0.03 <0.03   BNP (last 3 results) No results for input(s): PROBNP in the last 8760 hours. CBG: No results for input(s): GLUCAP in the last 168 hours.  No results found for this or any previous visit (from the past 240 hour(s)).       Studies: No results found.      Scheduled Meds: . amoxicillin-clavulanate  1 tablet Oral Q12H  . apixaban  5 mg Oral BID  . atorvastatin  10 mg Oral Daily  . diltiazem  360 mg Oral Daily  . fluticasone  2 spray Each Nare Daily  . furosemide  40 mg Oral Daily  . metoprolol tartrate  12.5 mg Oral BID  . sodium chloride  3 mL Intravenous Q12H  . sodium chloride  3 mL Intravenous Q12H   Continuous Infusions: . sodium chloride      Principal Problem:   Atrial flutter with rapid ventricular response Active Problems:   Hyperlipidemia   Obstructive sleep apnea   Essential hypertension   Prediabetes   Atrial flutter   Cigarette smoker two packs a day or less   Hypokalemia   Tobacco abuse    Time spent: 20 minutes.    Vernell Leep, MD, FACP, FHM. [facility] Pager [phone]  If 7PM-7AM, please contact night-coverage [email or web address] Password [email or web address] 05/07/2014, 4:05 PM    LOS: 3 days

## 2014-05-07 NOTE — Transfer of Care (Signed)
Immediate Anesthesia Transfer of Care Note  Patient: Ronald [name]  Procedure(s) Performed: Procedure(s): TRANSESOPHAGEAL ECHOCARDIOGRAM (TEE) (N/A) CARDIOVERSION (N/A)  Patient Location: PACU and Endoscopy Unit  Anesthesia Type:MAC  Level of Consciousness: awake, alert  and oriented  Airway & Oxygen Therapy: Patient Spontanous Breathing and Patient connected to nasal cannula oxygen  Post-op Assessment: Report given to RN and Post -op Vital signs reviewed and stable  Post vital signs: Reviewed and stable  Last Vitals:  Filed Vitals:   05/07/14 1229  BP: 151/92  Pulse: 117  Temp: 36.6 C  Resp: 22    Complications: No apparent anesthesia complications

## 2014-05-07 NOTE — Progress Notes (Addendum)
    Subjective:  No significant SOB, no CP. Did not realize he was in flutter  Wife in room. Still with difficultly controlling rate with activity. At night 70's. When awake 120-140.   Objective:  Vital Signs in the last 24 hours: Temp:  [97.8 F (36.6 C)-98.6 F (37 C)] 98.6 F (37 C) (01/29 0833) Pulse Rate:  [64-79] 79 (01/29 0833) Resp:  [16-18] 16 (01/29 0833) BP: (118-142)/(74-86) 142/86 mmHg (01/29 0833) SpO2:  [94 %-97 %] 95 % (01/29 0833)  Intake/Output from previous day: 01/28 0701 - 01/29 0700 In: 720 [P.O.:720] Out: -    Physical Exam: General: Well developed, well nourished, in no acute distress. Head:  Normocephalic and atraumatic. Lungs: Clear to auscultation and percussion. Heart: Normal S1 and S2, irreg tachy.  No murmur, rubs or gallops.  Abdomen: soft, non-tender, positive bowel sounds. Overweight Extremities: No clubbing or cyanosis. No edema. Neurologic: Alert and oriented x 3.    Lab Results:  Recent Labs  05/06/14 0508 05/07/14 0500  WBC 6.0 7.0  HGB 13.7 13.7  PLT 209 192    Recent Labs  05/06/14 0508 05/07/14 0500  NA 139 142  K 3.7 4.4  CL 105 105  CO2 26 29  GLUCOSE 118* 126*  BUN 12 14  CREATININE 0.92 1.05    Recent Labs  05/04/14 1453 05/05/14 0213  TROPONINI <0.03 <0.03   Hepatic Function Panel  Recent Labs  05/05/14 0213  PROT 5.7*  ALBUMIN 3.5  AST 18  ALT 22  ALKPHOS 67  BILITOT 0.8      Telemetry: Aflutter 70-80 currently when up 120's Personally viewed.  Cardiac Studies:  EF50%,moderate LA enlargement  Assessment/Plan:  Principal Problem:   Atrial flutter with rapid ventricular response Active Problems:   Hyperlipidemia   Obstructive sleep apnea   Essential hypertension   Prediabetes   Atrial flutter   Cigarette smoker two packs a day or less   Hypokalemia   Tobacco abuse   69 year old with urology procedure cancelled 05/04/14 secondary to aflutter with RVR.  Aflutter  - on Eliquis.  Discussed bleeding risks. Stroke prevention. Stop ASA. Still with mild SOB.   - Diltiazem for rate control (IV did good job when in bed but increases when awake. Changed to oral 360 CD.    - Because we are still battling with rate control which is often an issue with atrial flutter, I will set him up for cardioversion today at [facility] with TEE. Discussed risks and benefits (stroke, bleeding, esophageal damage.Marland Kitchen). This will allow for 5 doses of Eliquis.  - If cardioversion is successful, will anticipate DC later today.   - NPO    - EF low normal - reassuring  - Trop normal. TSH normal.   - Avoid ETOH  - Asymptomatic. Weight gain noted. OK with lasix with KCL.   - postpone elective procedure until situation cleared. (likely 2 months).   Hypokalemia  - stable today.  Obesity  - weight loss  OSA  - treat. Hand in hand with flutter  Lasix 40.  Recommend lasix 20mg  PO QD with KCL at home.   Smoking cessation  Has follow up with Dr. Allison Quarry office.    SKAINS, [place] 05/07/2014, 8:54 AM

## 2014-05-07 NOTE — Anesthesia Preprocedure Evaluation (Signed)
Anesthesia Evaluation  Patient identified by MRN, date of birth, ID band Patient awake    Reviewed: Allergy & Precautions, NPO status , Patient's Chart, lab work & pertinent test results  Airway Mallampati: II  TM Distance: >3 FB Neck ROM: Full    Dental no notable dental hx.    Pulmonary sleep apnea , Current Smoker,  breath sounds clear to auscultation  Pulmonary exam normal       Cardiovascular hypertension, Pt. on medications + dysrhythmias Atrial Fibrillation Rhythm:Regular Rate:Tachycardia     Neuro/Psych negative neurological ROS  negative psych ROS   GI/Hepatic negative GI ROS, Neg liver ROS,   Endo/Other  negative endocrine ROS  Renal/GU negative Renal ROS  negative genitourinary   Musculoskeletal negative musculoskeletal ROS (+)   Abdominal   Peds negative pediatric ROS (+)  Hematology negative hematology ROS (+)   Anesthesia Other Findings   Reproductive/Obstetrics negative OB ROS                             Anesthesia Physical Anesthesia Plan  ASA: III  Anesthesia Plan: General   Post-op Pain Management:    Induction: Intravenous  Airway Management Planned: Mask  Additional Equipment:   Intra-op Plan:   Post-operative Plan:   Informed Consent: I have reviewed the patients History and Physical, chart, labs and discussed the procedure including the risks, benefits and alternatives for the proposed anesthesia with the patient or authorized representative who has indicated his/her understanding and acceptance.   Dental advisory given  Plan Discussed with: CRNA and Surgeon  Anesthesia Plan Comments:         Anesthesia Quick Evaluation

## 2014-05-07 NOTE — Op Note (Signed)
Patient anesthetized by anesthesia with fentanyl and propofol With pads in AP position, patient cardioverted to SR with 200 J synchronized biphasic energy. 12 lead EKG pending   Procedure without complication.

## 2014-05-07 NOTE — Progress Notes (Signed)
  Echocardiogram Echocardiogram Transesophageal has been performed.  Ronald [name] 05/07/2014, 2:44 PM

## 2014-05-07 NOTE — Progress Notes (Signed)
Reviewed with Dr. Algis Liming  Would recomm patient stay O/N to follow HR on meds now that in SR.

## 2014-05-07 NOTE — Progress Notes (Signed)
Patinet s/p cardioversion.  Tele with SR with frequent PACs  Will add low dose b blocker to see if this helps decrease ectopy  Follow BP and HR.

## 2014-05-07 NOTE — Progress Notes (Signed)
Patient to self-administer CPAP using home unit.  Patient is familiar with equipment and procedure.

## 2014-05-07 NOTE — H&P (View-Only) (Signed)
    Subjective:  No significant SOB, no CP. Did not realize he was in flutter  Wife in room. Still with difficultly controlling rate with activity. At night 70's. When awake 120-140.   Objective:  Vital Signs in the last 24 hours: Temp:  [97.8 F (36.6 C)-98.6 F (37 C)] 98.6 F (37 C) (01/29 0833) Pulse Rate:  [64-79] 79 (01/29 0833) Resp:  [16-18] 16 (01/29 0833) BP: (118-142)/(74-86) 142/86 mmHg (01/29 0833) SpO2:  [94 %-97 %] 95 % (01/29 0833)  Intake/Output from previous day: 01/28 0701 - 01/29 0700 In: 720 [P.O.:720] Out: -    Physical Exam: General: Well developed, well nourished, in no acute distress. Head:  Normocephalic and atraumatic. Lungs: Clear to auscultation and percussion. Heart: Normal S1 and S2, irreg tachy.  No murmur, rubs or gallops.  Abdomen: soft, non-tender, positive bowel sounds. Overweight Extremities: No clubbing or cyanosis. No edema. Neurologic: Alert and oriented x 3.    Lab Results:  Recent Labs  05/06/14 0508 05/07/14 0500  WBC 6.0 7.0  HGB 13.7 13.7  PLT 209 192    Recent Labs  05/06/14 0508 05/07/14 0500  NA 139 142  K 3.7 4.4  CL 105 105  CO2 26 29  GLUCOSE 118* 126*  BUN 12 14  CREATININE 0.92 1.05    Recent Labs  05/04/14 1453 05/05/14 0213  TROPONINI <0.03 <0.03   Hepatic Function Panel  Recent Labs  05/05/14 0213  PROT 5.7*  ALBUMIN 3.5  AST 18  ALT 22  ALKPHOS 67  BILITOT 0.8      Telemetry: Aflutter 70-80 currently when up 120's Personally viewed.  Cardiac Studies:  EF50%,moderate LA enlargement  Assessment/Plan:  Principal Problem:   Atrial flutter with rapid ventricular response Active Problems:   Hyperlipidemia   Obstructive sleep apnea   Essential hypertension   Prediabetes   Atrial flutter   Cigarette smoker two packs a day or less   Hypokalemia   Tobacco abuse   69 year old with urology procedure cancelled 05/04/14 secondary to aflutter with RVR.  Aflutter  - on Eliquis.  Discussed bleeding risks. Stroke prevention. Stop ASA. Still with mild SOB.   - Diltiazem for rate control (IV did good job when in bed but increases when awake. Changed to oral 360 CD.    - Because we are still battling with rate control which is often an issue with atrial flutter, I will set him up for cardioversion today at [facility] with TEE. Discussed risks and benefits (stroke, bleeding, esophageal damage.Marland Kitchen). This will allow for 5 doses of Eliquis.  - If cardioversion is successful, will anticipate DC later today.   - NPO    - EF low normal - reassuring  - Trop normal. TSH normal.   - Avoid ETOH  - Asymptomatic. Weight gain noted. OK with lasix with KCL.   - postpone elective procedure until situation cleared. (likely 2 months).   Hypokalemia  - stable today.  Obesity  - weight loss  OSA  - treat. Hand in hand with flutter  Lasix 40.  Recommend lasix 20mg  PO QD with KCL at home.   Smoking cessation  Has follow up with Dr. Allison Quarry office.    SKAINS, [place] 05/07/2014, 8:54 AM

## 2014-05-07 NOTE — Addendum Note (Signed)
Addendum  created 05/07/14 1715 by Eligha Bridegroom, CRNA   Modules edited: Anesthesia Events, Anesthesia Flowsheet, Anesthesia Responsible Staff

## 2014-05-08 LAB — CBC
HEMATOCRIT: 41.4 % (ref 39.0–52.0)
Hemoglobin: 14.1 g/dL (ref 13.0–17.0)
MCH: 34.5 pg — ABNORMAL HIGH (ref 26.0–34.0)
MCHC: 34.1 g/dL (ref 30.0–36.0)
MCV: 101.2 fL — ABNORMAL HIGH (ref 78.0–100.0)
PLATELETS: 197 10*3/uL (ref 150–400)
RBC: 4.09 MIL/uL — ABNORMAL LOW (ref 4.22–5.81)
RDW: 13.3 % (ref 11.5–15.5)
WBC: 7.8 10*3/uL (ref 4.0–10.5)

## 2014-05-08 MED ORDER — FLUTICASONE PROPIONATE 50 MCG/ACT NA SUSP: 2.0000 | Freq: Every day | NASAL | Status: DC | PRN

## 2014-05-08 MED ORDER — ATORVASTATIN CALCIUM 10 MG PO TABS: 10.0000 mg | ORAL_TABLET | Freq: Every evening | ORAL | Status: DC

## 2014-05-08 MED ORDER — TRIAMCINOLONE ACETONIDE 0.1 % EX CREA: 1.0000 "application " | TOPICAL_CREAM | Freq: Two times a day (BID) | CUTANEOUS | Status: DC | PRN

## 2014-05-08 MED ORDER — METOPROLOL TARTRATE 25 MG PO TABS: 12.5000 mg | ORAL_TABLET | Freq: Two times a day (BID) | ORAL | Status: DC

## 2014-05-08 MED ORDER — APIXABAN 5 MG PO TABS: 5.0000 mg | ORAL_TABLET | Freq: Two times a day (BID) | ORAL | Status: DC

## 2014-05-08 MED ORDER — LISINOPRIL 20 MG PO TABS: 20.0000 mg | ORAL_TABLET | Freq: Every evening | ORAL | Status: DC

## 2014-05-08 MED ORDER — DILTIAZEM HCL ER COATED BEADS 360 MG PO CP24: 360.0000 mg | ORAL_CAPSULE | Freq: Every day | ORAL | Status: DC

## 2014-05-08 MED ORDER — FUROSEMIDE 40 MG PO TABS: 40.0000 mg | ORAL_TABLET | Freq: Every day | ORAL | Status: DC

## 2014-05-08 NOTE — Discharge Summary (Signed)
Physician Discharge Summary  Ronald [name] BJY:[id number] DOB: 01-18-1946 DOA: 05/04/2014  PCP: [name], MD  Admit date: 05/04/2014 Discharge date: 05/08/2014  Time spent: Greater than 30 minutes  Recommendations for Outpatient Follow-up:  1. Cecilie Kicks, NP/Cardiology on 05/13/2014 at 11:30 AM. 2. Dr. Carolann Littler, PCP in 1 week.  Discharge Diagnoses:  Principal Problem:   Atrial flutter with rapid ventricular response Active Problems:   Hyperlipidemia   Obstructive sleep apnea   Essential hypertension   Prediabetes   Atrial flutter   Cigarette smoker two packs a day or less   Hypokalemia   Tobacco abuse   Discharge Condition: Improved & Stable  Diet recommendation: Heart healthy diet.  Filed Weights   05/04/14 1003  Weight: 102.513 kg (226 lb)    History of present illness:  69 year old male patient with history of HTN, HLD, prostate cancer, recent sinusitis-treated with Augmentin, was scheduled for bilateral hydrocele surgery on 1/26 but was noted to have heart rate in the 150s and sent to ED for admission. A shunt with new onset atrial flutter with RVR.  Hospital Course:    Atrial flutter with RVR: Initially started on IV Cardizem drip and heparin. Cardiology was consulted. Patient's heart rate was difficult to control on oral Cardizem. Thereby he underwent TEE followed by cardioversion on 1/29. He reverted to sinus rhythm but has been having some PVCs. Metoprolol was added post cardioversion. Cardiology has seen him today and cleared him for discharge on Cardizem CD, metoprolol, Eliquis, lisinopril and Lasix. Aspirin and amlodipine were discontinued. Patient advised to avoid NSAIDs secondary to bleeding risk while on anticoagulation. TSH normal. Patient counseled regarding avoiding alcohol.   Hypokalemia: Replaced  Obesity: He will need diet and weight loss.  OSA: Nightly C Pap  Tobacco abuse: Cessation counseling done.  Dyspnea: Possibly from a  flutter with RVR +/- CHF. Resolved. Continue Lasix. May have some degree of COPD from tobacco abuse. Counseled outpatient follow-up with pulmonology for PFTs.  Essential hypertension: Mildly uncontrolled and fluctuating. Amlodipine discontinued. Newly started on Cardizem and metoprolol for atrial flutter. Resume lisinopril at discharge.  Bilateral hydrocele: Elective procedure will have to be postponed.  Sinusitis: Complete course of Augmentin.  History of gout: Advised avoiding/minimizing NSAIDs and aspirin secondary to bleeding risk while on anticoagulation. May use Tylenol PRN.  HLD: Discussed with pharmacy today who stated that his Lipitor dose is small and should not have significant interaction with Cardizem. Continue Lipitor.  Consultants:  Cardiology  Procedures:  TEE 05/07/14: LA, LA appendage without masses AV mildly thickened Tr AI MV nromal Mild MR TV normal Mild TR PV normal Trace TR Diffult to accurately assess LVEF due to rapid rate Mild atherosclerotic plaquing of aorta    Cardioversion 05/07/14   Discharge Exam:  Complaints:  Denies complaints and is anxious to go home.  Filed Vitals:   05/07/14 2050 05/07/14 2303 05/08/14 0151 05/08/14 0527  BP: 128/73  130/87 118/76  Pulse: 98 84 83 73  Temp: 98.5 F (36.9 C) 98.2 F (36.8 C)  97.8 F (36.6 C)  TempSrc: Oral Oral  Oral  Resp:  20  20  Height:      Weight:      SpO2:  95%  96%    General exam: Moderately built and obese middle-aged male sitting up comfortably in bed. Respiratory system: Clear. No increased work of breathing. Cardiovascular system: S1 & S2 heard, RRR. No JVD, murmurs, gallops, clicks or pedal edema. Telemetry: Sinus rhythm 70-90/m with  some PVCs.  Gastrointestinal system: Abdomen is nondistended, soft and nontender. Normal bowel sounds heard. Central nervous system: Alert and oriented. No focal neurological deficits. Extremities: Symmetric 5 x 5 power.  Discharge  Instructions      Discharge Instructions    (HEART FAILURE PATIENTS) Call MD:  Anytime you have any of the following symptoms: 1) 3 pound weight gain in 24 hours or 5 pounds in 1 week 2) shortness of breath, with or without a dry hacking cough 3) swelling in the hands, feet or stomach 4) if you have to sleep on extra pillows at night in order to breathe.    Complete by:  As directed      Call MD for:  difficulty breathing, headache or visual disturbances    Complete by:  As directed      Diet - low sodium heart healthy    Complete by:  As directed      Increase activity slowly    Complete by:  As directed             Medication List    STOP taking these medications        amLODipine 5 MG tablet  Commonly known as:  NORVASC     aspirin 81 MG chewable tablet     indomethacin 50 MG capsule  Commonly known as:  INDOCIN     naproxen sodium 220 MG tablet  Commonly known as:  ANAPROX      TAKE these medications        amoxicillin-clavulanate 875-125 MG per tablet  Commonly known as:  AUGMENTIN  Take 1 tablet by mouth 2 (two) times daily.     apixaban 5 MG Tabs tablet  Commonly known as:  ELIQUIS  Take 1 tablet (5 mg total) by mouth 2 (two) times daily.     atorvastatin 10 MG tablet  Commonly known as:  LIPITOR  Take 1 tablet (10 mg total) by mouth every evening.     diltiazem 360 MG 24 hr capsule  Commonly known as:  CARDIZEM CD  Take 1 capsule (360 mg total) by mouth daily.     fexofenadine 180 MG tablet  Commonly known as:  ALLEGRA  Take 180 mg by mouth daily.     fluticasone 50 MCG/ACT nasal spray  Commonly known as:  FLONASE  Place 2 sprays into both nostrils daily as needed for allergies or rhinitis.     furosemide 40 MG tablet  Commonly known as:  LASIX  Take 1 tablet (40 mg total) by mouth daily.     lisinopril 20 MG tablet  Commonly known as:  PRINIVIL,ZESTRIL  Take 1 tablet (20 mg total) by mouth every evening.     metoprolol tartrate 25 MG tablet   Commonly known as:  LOPRESSOR  Take 0.5 tablets (12.5 mg total) by mouth 2 (two) times daily.     triamcinolone cream 0.1 %  Commonly known as:  KENALOG  Apply 1 application topically 2 (two) times daily as needed (for rash).       Follow-up Information    Follow up with [facility], NP On 05/13/2014.   Specialty:  Cardiology   Why:  11:30am   Contact information:   [address] [place] [address] [phone]       Follow up with [name], MD. Schedule an appointment as soon as possible for a visit in 1 week.   Specialty:  Family Medicine   Contact information:  [place] [address] [phone]        The results of significant diagnostics from this hospitalization (including imaging, microbiology, ancillary and laboratory) are listed below for reference.    Significant Diagnostic Studies: Dg Chest 2 View  05/04/2014   CLINICAL DATA:  Tachycardia today, shortness of breath, hypertension, smoker, prostate cancer, had planned testicular surgery today which could not be performed due to tachycardia  EXAM: CHEST  2 VIEW  COMPARISON:  12/23/2003  FINDINGS: Enlargement of cardiac silhouette with pulmonary vascular congestion.  Interstitial prominence in perihilar regions extending into lung bases question pulmonary edema.  No segmental consolidation or pneumothorax.  Tiny BILATERAL pleural effusions.  No acute osseous findings.  IMPRESSION: Enlargement of cardiac silhouette with pulmonary vascular congestion and question minimal pulmonary edema.  Tiny bibasilar pleural effusions.   Electronically Signed   By: Lavonia Dana M.D.   On: 05/04/2014 12:14    Microbiology: No results found for this or any previous visit (from the past 240 hour(s)).   Labs: Basic Metabolic Panel:  Recent Labs Lab 05/04/14 0839 05/04/14 1119 05/04/14 1454 05/05/14 0213 05/06/14 0508 05/07/14 0500  NA 142 140  --  143 139 142  K 4.1 4.1  --   3.1* 3.7 4.4  CL  --  108  --  106 105 105  CO2  --  24  --  28 26 29   GLUCOSE 112* 106*  --  112* 118* 126*  BUN  --  15  --  16 12 14   CREATININE  --  0.92  --  0.95 0.92 1.05  CALCIUM  --  8.7  --  8.5 8.8 9.0  MG  --   --  1.8  --   --   --    Liver Function Tests:  Recent Labs Lab 05/04/14 1119 05/05/14 0213  AST 27 18  ALT 28 22  ALKPHOS 70 67  BILITOT 0.4 0.8  PROT 5.8* 5.7*  ALBUMIN 3.5 3.5   No results for input(s): LIPASE, AMYLASE in the last 168 hours. No results for input(s): AMMONIA in the last 168 hours. CBC:  Recent Labs Lab 05/04/14 1119 05/05/14 0213 05/06/14 0508 05/07/14 0500 05/08/14 0555  WBC 6.5 6.7 6.0 7.0 7.8  NEUTROABS 4.4  --   --   --   --   HGB 14.7 13.8 13.7 13.7 14.1  HCT 43.9 42.0 42.5 43.1 41.4  MCV 100.2* 100.0 100.7* 100.9* 101.2*  PLT 208 226 209 192 197   Cardiac Enzymes:  Recent Labs Lab 05/04/14 1119 05/04/14 1453 05/05/14 0213  TROPONINI <0.03 <0.03 <0.03   BNP: BNP (last 3 results) No results for input(s): PROBNP in the last 8760 hours. CBG: No results for input(s): GLUCAP in the last 168 hours.   Additional labs: 1. TSH: 1.343 2. Hemoglobin A1c: 5.8   Signed:  Vernell Leep, MD, FACP, FHM. [facility] Pager [phone]  If 7PM-7AM, please contact night-coverage [email or web address] Password [email or web address] 05/08/2014, 10:07 AM

## 2014-05-08 NOTE — Progress Notes (Signed)
Subjective:  He says he feels better after the cardioversion and is no longer in atrial flutter.  Not happy about delay in transportation back here.  Objective:  Vital Signs in the last 24 hours: BP 118/76 mmHg  Pulse 73  Temp(Src) 97.8 F (36.6 C) (Oral)  Resp 20  Ht 5\' 11"  (1.803 m)  Wt 102.513 kg (226 lb)  BMI 31.53 kg/m2  SpO2 96%  Physical Exam: Pleasant male in no acute distress Lungs:  Clear  Cardiac:  Regular rhythm, normal S1 and S2, no S3 Extremities:  No edema present  Intake/Output from previous day: 01/29 0701 - 01/30 0700 In: 872 [P.O.:222; I.V.:250] Out: -  Weight Filed Weights   05/04/14 1003  Weight: 102.513 kg (226 lb)    Lab Results: Basic Metabolic Panel:  Recent Labs  05/06/14 0508 05/07/14 0500  NA 139 142  K 3.7 4.4  CL 105 105  CO2 26 29  GLUCOSE 118* 126*  BUN 12 14  CREATININE 0.92 1.05    CBC:  Recent Labs  05/07/14 0500 05/08/14 0555  WBC 7.0 7.8  HGB 13.7 14.1  HCT 43.1 41.4  MCV 100.9* 101.2*  PLT 192 197    Telemetry: Sinus rhythm overnight.  Following cardioversion  Assessment/Plan:  1.  Atrial flutter-resolved post cardioversion 2.  Hypertension.  Recommendations:  Okay to go home today with diltiazem in place of amlodipine, ACE inhibitor and furosemide for his blood pressure.  Continue Eliquis at current dose.  Needs early follow-up at the end of the week with cardiology.   Kerry Hough  MD [facility]  05/08/2014, 8:15 AM

## 2014-05-10 ENCOUNTER — Encounter (HOSPITAL_COMMUNITY): Payer: Self-pay | Admitting: Internal Medicine

## 2014-05-12 ENCOUNTER — Encounter (HOSPITAL_BASED_OUTPATIENT_CLINIC_OR_DEPARTMENT_OTHER): Payer: Self-pay | Admitting: Urology

## 2014-05-13 ENCOUNTER — Ambulatory Visit (INDEPENDENT_AMBULATORY_CARE_PROVIDER_SITE_OTHER): Payer: Federal, State, Local not specified - PPO | Admitting: Cardiology

## 2014-05-13 ENCOUNTER — Encounter: Payer: Self-pay | Admitting: Cardiology

## 2014-05-13 VITALS — BP 112/80 | HR 103 | Ht 71.0 in | Wt 209.5 lb

## 2014-05-13 DIAGNOSIS — R000 Tachycardia, unspecified: Secondary | ICD-10-CM

## 2014-05-13 DIAGNOSIS — I4891 Unspecified atrial fibrillation: Secondary | ICD-10-CM | POA: Diagnosis not present

## 2014-05-13 NOTE — Patient Instructions (Signed)
Your physician has requested that you have en exercise stress myoview. For further information please visit [email or web address]. Please follow instruction sheet, as given.  Your physician recommends that you schedule a follow-up appointment ONE WEEK after your stress test if unable to get an appointment with [facility] on 05/19/14

## 2014-05-13 NOTE — Progress Notes (Signed)
Cardiology Office Note   Date:  05/13/2014   ID:  Ronald [name], DOB June 12, 1945, MRN [id number]  PCP:  Ronald Post, MD  Cardiologist:  [name]    Chief Complaint  Patient presents with  . Hospitalization Follow-up    pt stated he's been doing great, pt was supposed to stop amlodipine upon discharge but stated he is still taking that medication        History of Present Illness: Ronald [name] is a 69 y.o. male who presents for hospital follow-up after admission for rapid A. Fib.  Past medical history significant for HTN, HLD, OSA & Prostate CA with not formal cardiac work-up or Dx who was coming in for a planned Urology procedure & was noted to be tachycardic. Procedure cancelled & he was sent to ER,  patient had no awareness of tachycardia. He had no chest pain had been dyspneic on exertion and had had some fatigue as well. He was in atrial flutter at a rate of 150.  He is admitted to [facility] in place on IV heparin and given IV diltiazem.  CHA2DS2VASC score of 2, so Eliquis was added.  EF was 50-55%. He was cardioverted into a sinus rhythm with frequent PACs he also had PVCs.  He was discharged on diltiazem and beta blocker.    Today he is back for follow-up of atrial fibrillation and unfortunately he is in atrial fib. He woke feeling very well this morning the best he has felt actually.  He denies any chest pain and currently no shortness of breath or lightheadedness.  He continues to smoke but is trying to cut back. He does have obstructive sleep apnea but has been wearing his CPAP.    We discussed different options whether to treat with more medications or to go to EP for A. fib ablation.  Patient and his wife.have a strong opinion one way or the other.  He continues on lasix, had wt. Loss with lasix in hospital.  He was still taking amlodipine I instructed to stop.   Past Medical History  Diagnosis Date  . Hypertension   . Hyperlipidemia   . Gout   . Tinea  versicolor   . OSA on CPAP     per study 08-03-2004  Severe OSA  . Allergic rhinitis   . Prostate cancer     Dx  2014--  stage T1c, Gleason 3+3=6, PSA 6.67--  Active surveillance  . Bilateral hydrocele   . History of colon polyps     tubular adenoma 2013  . Sigmoid diverticulosis   . Cigarette smoker two packs a day or less     Past Surgical History  Procedure Laterality Date  . Colonoscopy  last one 06-07-2011  . Prostate biopsy  05/15/12    Clinically both Lobes  . Tonsillectomy  as child  . Laminectomy and microdiscectomy lumbar spine  12-23-2003    Left L5 -- S1  . Laparoscopic bilateral inguinal hernia repair/  umbilical hernia repair with mesh/  aspiration left hydrocele  07-11-2012  . Transthoracic echocardiogram  05/01/2014    mild LVH; EF 50-55% (cannot measure diastolic Fxn with Afib), Mod LA Dilation, mild MR  . Tee without cardioversion N/A 05/07/2014    Procedure: TRANSESOPHAGEAL ECHOCARDIOGRAM (TEE);  Surgeon: Ronald Records, MD;  Location: [facility] ENDOSCOPY;  Service: Cardiovascular;  Laterality: N/A;  . Cardioversion N/A 05/07/2014    Procedure: CARDIOVERSION;  Surgeon: Ronald Records, MD;  Location: [facility];  Service: Cardiovascular;  Laterality: N/A;     Current Outpatient Prescriptions  Medication Sig Dispense Refill  . apixaban (ELIQUIS) 5 MG TABS tablet Take 1 tablet (5 mg total) by mouth 2 (two) times daily. 60 tablet 0  . atorvastatin (LIPITOR) 10 MG tablet Take 1 tablet (10 mg total) by mouth every evening.    . diltiazem (CARDIZEM CD) 360 MG 24 hr capsule Take 1 capsule (360 mg total) by mouth daily. 30 capsule 0  . fexofenadine (ALLEGRA) 180 MG tablet Take 180 mg by mouth daily.     . fluticasone (FLONASE) 50 MCG/ACT nasal spray Place 2 sprays into both nostrils daily as needed for allergies or rhinitis.    . furosemide (LASIX) 40 MG tablet Take 1 tablet (40 mg total) by mouth daily. 30 tablet 0  . lisinopril (PRINIVIL,ZESTRIL) 20 MG tablet Take 1 tablet (20 mg  total) by mouth every evening.    . metoprolol tartrate (LOPRESSOR) 25 MG tablet Take 0.5 tablets (12.5 mg total) by mouth 2 (two) times daily. 60 tablet 0  . triamcinolone cream (KENALOG) 0.1 % Apply 1 application topically 2 (two) times daily as needed (for rash).     No current facility-administered medications for this visit.    Allergies:   Review of patient's allergies indicates no known allergies.    Social History:  The patient  reports that he has been smoking Cigarettes.  He has a 55 pack-year smoking history. He has never used smokeless tobacco. He reports that he drinks about 3.0 oz of alcohol per week. He reports that he does not use illicit drugs.   Family History:  The patient's family history includes Breast cancer in his mother; Ovarian cancer in his sister; Stroke in an other family member. There is no history of Colon cancer, Esophageal cancer, Rectal cancer, or Stomach cancer.    ROS:  General:no colds or fevers, no weight changes Skin:no rashes or ulcers HEENT:no blurred vision, no congestion CV:see HPI PUL:see HPI GI:no diarrhea constipation or melena, no indigestion GU:no hematuria, no dysuria MS:no joint pain, no claudication, recent episode of gout, treated with tylenol Neuro:no syncope, no lightheadedness Endo:no diabetes, no thyroid disease   Wt Readings from Last 3 Encounters:  05/13/14 209 lb 8 oz (95.029 kg)  05/04/14 226 lb (102.513 kg)  05/04/14 225 lb (102.059 kg)     PHYSICAL EXAM: VS:  BP 112/80 mmHg  Pulse 103  Ht 5\' 11"  (1.803 m)  Wt 209 lb 8 oz (95.029 kg)  BMI 29.23 kg/m2 , BMI Body mass index is 29.23 kg/(m^2). General:Pleasant affect, NAD Skin:Warm and dry, brisk capillary refill HEENT:normocephalic, sclera clear, mucus membranes moist Neck:supple, no JVD, no bruits, no adenopathy  Heart:irregirreg  without murmur, gallup, rub or click Lungs:clear without rales, rhonchi, or wheezes ATF:TDDU, non tender, + BS, do not palpate liver  spleen or masses Ext:no lower ext edema, 2+ pedal pulses, 2+ radial pulses Neuro:alert and oriented, MAE, follows commands, + facial symmetry  EKG:  EKG is ordered today. The ekg ordered today demonstrates atrial fib with HR 103, no acute changes other than now in a fib.     Recent Labs: 05/04/2014: B Natriuretic Peptide 83.0; Magnesium 1.8; TSH 1.343 05/05/2014: ALT 22 05/07/2014: BUN 14; Creatinine 1.05; Potassium 4.4; Sodium 142 05/08/2014: Hemoglobin 14.1; Platelets 197    Lipid Panel    Component Value Date/Time   CHOL 146 04/17/2013 1017   TRIG 83.0 04/17/2013 1017   HDL 45.80 04/17/2013 1017   CHOLHDL 3 04/17/2013  1017   VLDL 16.6 04/17/2013 1017   LDLCALC 84 04/17/2013 1017   LDLDIRECT 57.8 03/10/2012 0834       Other studies Reviewed: Additional studies/ [name] that were reviewed today include:  Echo:  .Left ventricle: The cavity size was normal. Wall thickness was increased in a pattern of mild LVH. Systolic function was normal. The estimated ejection fraction was in the range of 50% to 55%. Wall motion was normal; there were no regional wall motion abnormalities. The study is not technically sufficient to allow evaluation of LV diastolic function. - Aortic root: The aortic root was mildly dilated. - Mitral valve: There was mild regurgitation. - Left atrium: The atrium was moderately dilated. - Right atrium: The atrium was mildly dilated. Impressions: - Normal LV function; moderate LAE; mild RAE; mild MR.  TEE:  LA, LA appendage without masses AV mildly thickened Tr AI MV nromal Mild MR TV normal Mild TR PV normal Trace TR Diffult to accurately assess LVEF due to rapid rate Mild atherosclerotic plaquing of aorta        ASSESSMENT AND PLAN:  1.  Aflutter now a fib - on Eliquis. Discussed no bleeding since discharge. Stroke prevention. He had stopped ASA. Today not aware of atrial fib, no symptoms, but do not know when he went back  into it.  - Diltiazem has rate controlled the rhythm oral 360 CD.  -  EF low normal  - Trop normal. TSH normal.  - Avoid ETOH- at least no more than 1 daily discussed toxic nature of alcohol - Asymptomatic. Weight gain noted previously today with dry wt of 209 . OK with lasix with KCL. he will weight daily and call if weight increasing. - postpone elective procedure until situation cleared. (likely 2 months).   -discussed with Dr. Debara Pickett DOD- will order exercise myoview to eval for ischemic cause of a fib.  If negative begin antiarrythmic, possibly flecainide, then another cardioversion.  We also discussed possible ablation and EP referral.  We will decide when stress results back.  Patient will follow-up with either [name] or an APP in the next week or so. If his weight increases or he becomes more short of breath he will contact us. No other changes currently.  Obesity - weight loss  OSA - treat. Hand in hand with flutter- he does wear his CPAP  Volume overload has resolved but with mildly decreased EF continue.   lasix 40mg  PO QD with KCL at home.   Smoking cessation- again discussed importance     Current medicines are reviewed at length with the patient today.  The patient has no concerns regarding medicines. Has good understanding.  The following changes have been made:  See above  Labs/ tests ordered today include:see above  Disposition:   FU:  see above  Signed, Isaiah Serge, NP  05/13/2014 1:02 PM    [facility] [place], [place], [place], [place] Phone: [phone]; Fax: [phone]

## 2014-05-14 ENCOUNTER — Telehealth (HOSPITAL_COMMUNITY): Payer: Self-pay

## 2014-05-14 ENCOUNTER — Encounter: Payer: Self-pay | Admitting: Family Medicine

## 2014-05-14 ENCOUNTER — Ambulatory Visit (INDEPENDENT_AMBULATORY_CARE_PROVIDER_SITE_OTHER): Payer: Federal, State, Local not specified - PPO | Admitting: Family Medicine

## 2014-05-14 VITALS — BP 130/80 | HR 90 | Temp 98.0°F | Wt 212.0 lb

## 2014-05-14 DIAGNOSIS — I10 Essential (primary) hypertension: Secondary | ICD-10-CM | POA: Diagnosis not present

## 2014-05-14 DIAGNOSIS — I4892 Unspecified atrial flutter: Secondary | ICD-10-CM | POA: Diagnosis not present

## 2014-05-14 MED ORDER — APIXABAN 5 MG PO TABS: 5.0000 mg | ORAL_TABLET | Freq: Two times a day (BID) | ORAL | Status: DC

## 2014-05-14 NOTE — Telephone Encounter (Signed)
Encounter complete. 

## 2014-05-14 NOTE — Progress Notes (Signed)
Pre visit review using our clinic review tool, if applicable. No additional management support is needed unless otherwise documented below in the visit note. 

## 2014-05-14 NOTE — Progress Notes (Signed)
Subjective:    Patient ID: Ronald [name], male    DOB: 1945/06/26, 69 y.o.   MRN: [id number]  HPI Patient is seen for follow-up regarding atrial fibrillation/atrial flutter. He had been seen here recently for acute sinusitis and we did not detect any significant heart irregularity that point. He was going in for elective surgery for hydrocele repair and was noted to be in atrial flutter with rate 150. He was at that point admitted to cardiology. Patient was treated with IV Cardizem drip and heparin. Rate difficult to control on oral Cardizem. Underwent TEE followed by cardioversion on 1/29. Patient was discharged on Cardizem and metoprolol as well as Eliquis, lisinopril, and Lasix. He was taken off amlodipine but apparently did not get this information and continued to take this at home initially. He was seen and followed by cardiology yesterday -back in atrial fibrillation but rate controlled. He was instructed to stop amlodipine. Patient has restricted alcohol since diagnosis.  Echocardiogram revealed EF 50-55%. Patient has been scheduled for myocardial perfusion imaging for next week. No history of CAD. No chest pain. Recent thyroid functions normal. No recent pneumonia. No history of pulmonary emboli. Recent BNP normal. No history of CHF. Patient states he needs refill of Eliquis for mail order. He is in process of trying to quit smoking.  Past Medical History  Diagnosis Date  . Hypertension   . Hyperlipidemia   . Gout   . Tinea versicolor   . OSA on CPAP     per study 08-03-2004  Severe OSA  . Allergic rhinitis   . Prostate cancer     Dx  2014--  stage T1c, Gleason 3+3=6, PSA 6.67--  Active surveillance  . Bilateral hydrocele   . History of colon polyps     tubular adenoma 2013  . Sigmoid diverticulosis   . Cigarette smoker two packs a day or less    Past Surgical History  Procedure Laterality Date  . Colonoscopy  last one 06-07-2011  . Prostate biopsy  05/15/12    Clinically  both Lobes  . Tonsillectomy  as child  . Laminectomy and microdiscectomy lumbar spine  12-23-2003    Left L5 -- S1  . Laparoscopic bilateral inguinal hernia repair/  umbilical hernia repair with mesh/  aspiration left hydrocele  07-11-2012  . Transthoracic echocardiogram  05/01/2014    mild LVH; EF 50-55% (cannot measure diastolic Fxn with Afib), Mod LA Dilation, mild MR  . Tee without cardioversion N/A 05/07/2014    Procedure: TRANSESOPHAGEAL ECHOCARDIOGRAM (TEE);  Surgeon: [name], MD;  Location: [facility] ENDOSCOPY;  Service: Cardiovascular;  Laterality: N/A;  . Cardioversion N/A 05/07/2014    Procedure: CARDIOVERSION;  Surgeon: [name], MD;  Location: [facility] ENDOSCOPY;  Service: Cardiovascular;  Laterality: N/A;    reports that he has been smoking Cigarettes.  He has a 55 pack-year smoking history. He has never used smokeless tobacco. He reports that he drinks about 3.0 oz of alcohol per week. He reports that he does not use illicit drugs. family history includes Breast cancer in his mother; Ovarian cancer in his sister; Stroke in an other family member. There is no history of Colon cancer, Esophageal cancer, Rectal cancer, or Stomach cancer. No Known Allergies    Review of Systems  Constitutional: Negative for fatigue.  Eyes: Negative for visual disturbance.  Respiratory: Negative for cough, chest tightness and shortness of breath.   Cardiovascular: Negative for chest pain, palpitations and leg swelling.  Neurological: Negative for  dizziness, syncope, weakness, light-headedness and headaches.       Objective:   Physical Exam  Constitutional: He is oriented to person, place, and time. He appears well-developed and well-nourished.  Neck: Neck supple. No JVD present. No thyromegaly present.  Cardiovascular:  Irregularly irregular rhythm with rate of 92  Pulmonary/Chest: Effort normal and breath sounds normal. No respiratory distress. He has no wheezes. He has no rales.    Musculoskeletal: He exhibits no edema.  Neurological: He is alert and oriented to person, place, and time.          Assessment & Plan:  Atrial fibrillation/atrial flutter. Recent diagnosis. Workup as above. Pending Myoview imaging. Clinically, he is an atrial fibrillation today and rate controlled. Patient had CHADS score of 2.  He is asymptomatic at this time. Refill his Eliquis through mail order. We reviewed his medications and he seems to be taking these appropriately at this time. He discontinued amlodipine yesterday. Advised to quit smoking.

## 2014-05-14 NOTE — Patient Instructions (Signed)

## 2014-05-18 ENCOUNTER — Ambulatory Visit (HOSPITAL_COMMUNITY)
Admission: RE | Admit: 2014-05-18 | Discharge: 2014-05-18 | Disposition: A | Discharge status: Home / Self Care | Payer: Federal, State, Local not specified - PPO | Source: Ambulatory Visit | Attending: Cardiology | Admitting: Cardiology

## 2014-05-18 DIAGNOSIS — R9431 Abnormal electrocardiogram [ECG] [EKG]: Secondary | ICD-10-CM | POA: Insufficient documentation

## 2014-05-18 DIAGNOSIS — R5383 Other fatigue: Secondary | ICD-10-CM | POA: Diagnosis not present

## 2014-05-18 DIAGNOSIS — R0602 Shortness of breath: Secondary | ICD-10-CM | POA: Insufficient documentation

## 2014-05-18 DIAGNOSIS — R000 Tachycardia, unspecified: Secondary | ICD-10-CM

## 2014-05-18 DIAGNOSIS — E785 Hyperlipidemia, unspecified: Secondary | ICD-10-CM | POA: Insufficient documentation

## 2014-05-18 DIAGNOSIS — E663 Overweight: Secondary | ICD-10-CM | POA: Insufficient documentation

## 2014-05-18 DIAGNOSIS — I10 Essential (primary) hypertension: Secondary | ICD-10-CM | POA: Diagnosis not present

## 2014-05-18 DIAGNOSIS — I4891 Unspecified atrial fibrillation: Secondary | ICD-10-CM

## 2014-05-18 DIAGNOSIS — Z720 Tobacco use: Secondary | ICD-10-CM | POA: Diagnosis not present

## 2014-05-18 HISTORY — PX: NM MYOVIEW LTD: HXRAD82

## 2014-05-18 MED ORDER — TECHNETIUM TC 99M SESTAMIBI GENERIC - CARDIOLITE
30.3000 | Freq: Once | INTRAVENOUS | Status: AC | PRN
  Administered 2014-05-18: 30.3 via INTRAVENOUS

## 2014-05-18 MED ORDER — TECHNETIUM TC 99M SESTAMIBI GENERIC - CARDIOLITE
10.4000 | Freq: Once | INTRAVENOUS | Status: AC | PRN
  Administered 2014-05-18: 10 via INTRAVENOUS

## 2014-05-18 NOTE — Procedures (Addendum)
[facility] [address] [place] [address] [phone]  Cardiology Nuclear Med Study  Ronald [name] is a 69 y.o. male     MRN : [id number]     DOB: 06-Sep-1945  Procedure Date: 05/18/2014  Nuclear Med Background Indication for Stress Test:  Evaluation for Ischemia, [facility] and Abnormal EKG History:  AFIB W/RVR/AFLUTTER;No prior respiratory history reported; No prior NUC MPI for comparison. Cardiac Risk Factors: Hypertension, Lipids, Overweight and Smoker  Symptoms:  Fatigue and SOB   Nuclear Pre-Procedure Caffeine/Decaff Intake:  1:30am NPO After: 11:30am   IV Site: R Forearm  IV 0.9% NS with Angio Cath:  22g  Chest Size (in):  46"  IV Started by: Rolene Course, RN  Height: 5\' 11"  (1.803 m)  Cup Size: n/a  BMI:  Body mass index is 29.16 kg/(m^2). Weight:  209 lb (94.802 kg)   Tech Comments:  n/a    Nuclear Med Study 1 or 2 day study: 1 day  Stress Test Type:  Stress  Order Authorizing Provider:  Glenetta Hew, MD   Resting Radionuclide: Technetium 7m Sestamibi  Resting Radionuclide Dose: 10.4 mCi   Stress Radionuclide:  Technetium 28m Sestamibi  Stress Radionuclide Dose: 30.3 mCi           Stress Protocol Rest HR: 105 Stress HR: 176  Rest BP: 117/89 Stress BP: 174/108  Exercise Time (min): 7:31 METS: 8.80   Predicted Max HR: 152 bpm % Max HR: 115.79 bpm Rate Pressure Product: 10258  Dose of Adenosine (mg):  n/a Dose of Lexiscan: n/a mg  Dose of Atropine (mg): n/a Dose of Dobutamine: n/a mcg/kg/min (at max HR)  Stress Test Technologist: Mellody Memos, CCT Nuclear Technologist: Imagene Riches, CNMT   Rest Procedure:  Myocardial perfusion imaging was performed at rest 45 minutes following the intravenous administration of Technetium 14m Sestamibi. Stress Procedure:  The patient performed treadmill exercise using a Bruce  Protocol for 7 minutes 31 seconds. The patient stopped due to shortness of breath  and fatigue. Patient denied any chest pain.  There were no significant ST-T wave changes.  Technetium 25m Sestamibi was injected IV at peak exercise and myocardial perfusion imaging was performed after a brief delay.  Transient Ischemic Dilatation (Normal <1.22):  1.03  QGS EDV:  116 ml QGS ESV:  63 ml LV Ejection Fraction: 46%      Rest ECG: NSR - Normal EKG  Stress ECG: No significant change from baseline ECG  QPS Raw Data Images:  Normal; no motion artifact; normal heart/lung ratio. Stress Images:  Normal homogeneous uptake in all areas of the myocardium. Rest Images:  Normal homogeneous uptake in all areas of the myocardium. Subtraction (SDS):  No evidence of ischemia.  Impression Exercise Capacity:  Good exercise capacity. BP Response:  Normal blood pressure response. Clinical Symptoms:  No significant symptoms noted. ECG Impression:  No significant ST segment change suggestive of ischemia. Comparison with Prior Nuclear Study: No images to compare  Overall Impression:  Low risk stress nuclear study Nl perfusion with mild LV dysfunction. Suggest a 2D to correlate .  LV Wall Motion:  Mild LV dysfunction   Lorretta Harp, MD  05/18/2014 4:59 PM

## 2014-05-19 ENCOUNTER — Ambulatory Visit (INDEPENDENT_AMBULATORY_CARE_PROVIDER_SITE_OTHER): Payer: Federal, State, Local not specified - PPO | Admitting: Cardiology

## 2014-05-19 ENCOUNTER — Encounter: Payer: Self-pay | Admitting: Cardiology

## 2014-05-19 VITALS — BP 120/86 | HR 78 | Ht 71.0 in | Wt 212.6 lb

## 2014-05-19 DIAGNOSIS — I4892 Unspecified atrial flutter: Secondary | ICD-10-CM

## 2014-05-19 DIAGNOSIS — I10 Essential (primary) hypertension: Secondary | ICD-10-CM

## 2014-05-19 DIAGNOSIS — E785 Hyperlipidemia, unspecified: Secondary | ICD-10-CM

## 2014-05-19 MED ORDER — METOPROLOL TARTRATE 25 MG PO TABS
25.0000 mg | ORAL_TABLET | Freq: Two times a day (BID) | ORAL | Status: DC
Start: 2014-05-19 — End: 2014-10-13

## 2014-05-19 NOTE — Progress Notes (Signed)
Quick Note:  Stress Test looked good!! No sign of significant Heart Artery Disease. Pump function is normal.  Good news!!.  Ronald Man, MD  Reviewed in clinic ______

## 2014-05-19 NOTE — Patient Instructions (Signed)
Your physician has recommended making the following medication changes: INCREASE Metoprolol to 25 mg twice daily. If your resting heart rate is greater than 110 accompanied with shortness of breath - you may take an additional 25 mg tablet as needed.  [name] wants you to follow-up in 3 months.

## 2014-05-21 ENCOUNTER — Encounter: Payer: Self-pay | Admitting: Cardiology

## 2014-05-21 NOTE — Progress Notes (Signed)
PCP: [name], MD  Clinic Note: Chief Complaint  Patient presents with  . Follow-up    nuc. patient reports no complaints   HPI: Ronald [name] is a 69 y.o. male with a PMH below who presents today for one-week followup from visit with Garen Lah, NP. That was his initial post-hospital followup appointment. He has recently diagnosed atrial fibrillation/flutter. He was admitted to the hospital back in the end of January previous history of hypertension hyperlipidemia, OSA and prostate cancer without prior cardiac history. He had a urology procedure that was canceled due to tachycardia. He was found to be an A. Fib/flutter with a rate of 100 beats a minute. He was started on IV diltiazem but did not convert him. He was therefore cardioverted with TEE guidance back in sinus rhythm with PACs. He was supposed to stop amlodipine and below using diltiazem, however that did not happen. He saw Mickel Baas in clinic last week and was found to be back in atrial flutter. He was on Eliquis, so she simply put it back on diltiazem having noted adequate rate control. He now presented after her being referred for a Myoview stress test. Maisie Myoview was performed on February 9 and was found to be normal with no evidence of ischemia or or infarction.there is a suggestion of mild reduced EF of 46% which did not correlate with his echocardiogram in the hospital.   The plans were to consider possible antiarrhythmicmedication such as flecainide or a possible referral to EP atrial fibrillation chronic the he now presents to me in followup  Past Medical History  Diagnosis Date  . Hypertension   . Hyperlipidemia   . Gout   . Tinea versicolor   . OSA on CPAP     per study 08-03-2004  Severe OSA  . Allergic rhinitis   . Prostate cancer     Dx  2014--  stage T1c, Gleason 3+3=6, PSA 6.67--  Active surveillance  . Bilateral hydrocele   . History of colon polyps     tubular adenoma 2013  . Sigmoid  diverticulosis   . Cigarette smoker two packs a day or less     Prior Cardiac Evaluation and Past Surgical History: Past Surgical History  Procedure Laterality Date  . Colonoscopy  last one 06-07-2011  . Prostate biopsy  05/15/12    Clinically both Lobes  . Tonsillectomy  as child  . Laminectomy and microdiscectomy lumbar spine  12-23-2003    Left L5 -- S1  . Laparoscopic bilateral inguinal hernia repair/  umbilical hernia repair with mesh/  aspiration left hydrocele  07-11-2012  . Transthoracic echocardiogram  05/01/2014    mild LVH; EF 50-55% (cannot measure diastolic Fxn with Afib), Mod LA Dilation, mild MR  . Tee without cardioversion N/A 05/07/2014    Procedure: TRANSESOPHAGEAL ECHOCARDIOGRAM (TEE);  Surgeon: [name], MD;  Location: [facility] ENDOSCOPY;  Service: Cardiovascular;  Laterality: N/A;  . Cardioversion N/A 05/07/2014    Procedure: CARDIOVERSION;  Surgeon: [name], MD;  Location: [facility] ENDOSCOPY;  Service: Cardiovascular;  Laterality: N/A;    Interval History: Today he asked he sees me doing quite well, and is not really noticing much in way of any chest tightness or pressure symptoms.  No chest pain or shortness of breath with rest or exertion. No PND, orthopnea or edema. No palpitations, lightheadedness, dizziness, weakness or syncope/near syncope. No TIA/amaurosis fugax symptoms. No melena, hematochezia, hematuria, or epstaxis. No claudication.  ROS: A comprehensive was performed. ROS  Current Outpatient Prescriptions on File Prior to Visit  Medication Sig Dispense Refill  . apixaban (ELIQUIS) 5 MG TABS tablet Take 1 tablet (5 mg total) by mouth 2 (two) times daily. 180 tablet 1  . atorvastatin (LIPITOR) 10 MG tablet Take 1 tablet (10 mg total) by mouth every evening.    . diltiazem (CARDIZEM CD) 360 MG 24 hr capsule Take 1 capsule (360 mg total) by mouth daily. 30 capsule 0  . fexofenadine (ALLEGRA) 180 MG tablet Take 180 mg by mouth daily.     . fluticasone  (FLONASE) 50 MCG/ACT nasal spray Place 2 sprays into both nostrils daily as needed for allergies or rhinitis.    . furosemide (LASIX) 40 MG tablet Take 1 tablet (40 mg total) by mouth daily. 30 tablet 0  . lisinopril (PRINIVIL,ZESTRIL) 20 MG tablet Take 1 tablet (20 mg total) by mouth every evening.    . triamcinolone cream (KENALOG) 0.1 % Apply 1 application topically 2 (two) times daily as needed (for rash).     No current facility-administered medications on file prior to visit.   No Known Allergies  SOCIAL AND FAMILY HISTORY REVIEWED IN EPIC -- No change  Wt Readings from Last 3 Encounters:  05/19/14 212 lb 9.6 oz (96.435 kg)  05/18/14 209 lb (94.802 kg)  05/14/14 212 lb (96.163 kg)    PHYSICAL EXAM BP 120/86 mmHg  Pulse 78  Ht 5\' 11"  (1.803 m)  Wt 212 lb 9.6 oz (96.435 kg)  BMI 29.66 kg/m2 General appearance: alert, cooperative, appears stated age, no distress and borderline obese HEENT: [place]/AT, EOMI, MMM, anicteric sclera Neck: no adenopathy, no carotid bruit and no JVD Lungs: clear to auscultation bilaterally, normal percussion bilaterally and non-labored Heart: regular rate and rhythm, S1, S2 normal, no murmur, click, rub or gallop ; non-displaced PMI Abdomen: soft, non-tender; bowel sounds normal; no masses,  no organomegaly; Extremities: extremities normal, atraumatic, no cyanosis, and edema Pulses: 2+ and symmetric;  Neurologic: Mental status: Alert, oriented, thought content appropriate Cranial nerves: normal (II-XII grossly intact)    Adult ECG Report  Not done.   ASSESSMENT / PLAN: 1. Atrial flutter with rapid ventricular response   2. Atrial flutter, unspecified   3. Essential hypertension   4. Hyperlipidemia     Relatively stable with rate controlled A. Fib. We did not get an EKG today, but he asked his family to me that he was still in atrial fib. He is asymptomatic and therefore don't think we need to do a rhythm which related. Will simply increase his  beta blocker. Continue Eliquis for anticoagulation. Blood pressure is well controlled. He is on standing dose of Lasix for her mild heart failure symptoms occurred he gets tachycardic. He probably will not be taking that on daily basis.  Continue statin for hyperlipidemia  No problem-specific assessment & plan notes found for this encounter.   No orders of the defined types were placed in this encounter.   Meds ordered this encounter  Medications  . metoprolol tartrate (LOPRESSOR) 25 MG tablet    Sig: Take 1 tablet (25 mg total) by mouth 2 (two) times daily.    Dispense:  60 tablet    Refill:  11   INCREASE Metoprolol to 25 mg twice daily. If your resting heart rate is greater than 110 accompanied with shortness of breath - you may take an additional 25 mg tablet as needed.  [name] wants you to follow-up in 3 months.    [place], [place]  Viona Gilmore, M.D., M.S. Interventional Cardiologist   Pager # [phone]

## 2014-06-01 ENCOUNTER — Other Ambulatory Visit: Payer: Self-pay | Admitting: Family Medicine

## 2014-06-02 NOTE — Telephone Encounter (Signed)
Refill both for one year. 

## 2014-07-05 ENCOUNTER — Telehealth: Payer: Self-pay | Admitting: Cardiology

## 2014-07-05 NOTE — Telephone Encounter (Signed)
Dr. Allison Quarry patient Re: tachycardia in patient w/ hx of atrial flutter Pt has had HR fluctuations with high rate of 110-120s x2-3 days. Uncertain how sustained this has been, though pt can tell when it is fast.  Pt could not recall Dr. Allison Quarry instructions for this - ( reviewed notes w/ him, pt had been given instruction to take extra tablet of 25mg  metoprolol for this situation at last OV. )  Gave instruction on extra metoprolol dose, advised to see if this issue resolves w/in next couple days, & if not, call back later in the week so that we can schedule for OV.

## 2014-07-05 NOTE — Telephone Encounter (Signed)
Pt's wife called in stating that the pt's HR has been fluctuating all week and would like for him to be seen. She says that the doctor mentioned that when his HR got to high, that he could take an extra pill, but she is unsure of what the medication was. Please advise  Thanks

## 2014-07-07 ENCOUNTER — Telehealth: Payer: Self-pay | Admitting: Cardiology

## 2014-07-07 NOTE — Telephone Encounter (Signed)
Mrs. Villavicencio is calling back about her husband his heart  rate which is running in 90-100 and he is having some shortness of breath  . Was told to call back today . Please call    Thanks

## 2014-07-07 NOTE — Telephone Encounter (Signed)
Returned call to patient's wife she stated husband's heart rate continues to be fast.Stated averages mid 90's to 100's less than 110.Stated he has not took a extra metoprolol.Stated he woke up this morning and pulse 107.He is more sob at times. No swelling noticed.Stated they are suppose to go out of town this weekend and she is concerned.Dr.Harding out of office this week,will check with DOD Dr.Croitoru and call her back.

## 2014-07-07 NOTE — Telephone Encounter (Signed)
Returned call to patient's wife spoke to DOD Dr.Croitoru.He advised may take extra 25 mg metoprolol daily if heart rate 100.Advised to keep appointment with Dr.Harding 08/16/14 and call sooner if needed.

## 2014-07-23 ENCOUNTER — Encounter: Payer: Self-pay | Admitting: Family Medicine

## 2014-07-23 ENCOUNTER — Ambulatory Visit (INDEPENDENT_AMBULATORY_CARE_PROVIDER_SITE_OTHER): Payer: Federal, State, Local not specified - PPO | Admitting: Family Medicine

## 2014-07-23 VITALS — BP 140/80 | HR 90 | Temp 98.1°F | Wt 217.0 lb

## 2014-07-23 DIAGNOSIS — J209 Acute bronchitis, unspecified: Secondary | ICD-10-CM

## 2014-07-23 MED ORDER — DILTIAZEM HCL ER COATED BEADS 360 MG PO CP24: 360.0000 mg | ORAL_CAPSULE | Freq: Every day | ORAL | Status: DC

## 2014-07-23 NOTE — Progress Notes (Signed)
Pre visit review using our clinic review tool, if applicable. No additional management support is needed unless otherwise documented below in the visit note. 

## 2014-07-23 NOTE — Progress Notes (Signed)
   Subjective:    Patient ID: Ronald [name], male    DOB: 1945-08-05, 69 y.o.   MRN: [id number]  HPI Acute visit. Patient seen with about 2 week history of cough. Developed before he went out to [facility]. Initially had some wheezing but cough is been mostly nonproductive and no fever. He has long history of smoking. He has atrial fibrillation/flutter is recently had some elevated pulse currently taking metoprolol 25 mg 3 times a day. No chest pains. He feels he has improved past couple days with no wheezing  Past Medical History  Diagnosis Date  . Hypertension   . Hyperlipidemia   . Gout   . Tinea versicolor   . OSA on CPAP     per study 08-03-2004  Severe OSA  . Allergic rhinitis   . Prostate cancer     Dx  2014--  stage T1c, Gleason 3+3=6, PSA 6.67--  Active surveillance  . Bilateral hydrocele   . History of colon polyps     tubular adenoma 2013  . Sigmoid diverticulosis   . Cigarette smoker two packs a day or less    Past Surgical History  Procedure Laterality Date  . Colonoscopy  last one 06-07-2011  . Prostate biopsy  05/15/12    Clinically both Lobes  . Tonsillectomy  as child  . Laminectomy and microdiscectomy lumbar spine  12-23-2003    Left L5 -- S1  . Laparoscopic bilateral inguinal hernia repair/  umbilical hernia repair with mesh/  aspiration left hydrocele  07-11-2012  . Transthoracic echocardiogram  05/01/2014    mild LVH; EF 50-55% (cannot measure diastolic Fxn with Afib), Mod LA Dilation, mild MR  . Tee without cardioversion N/A 05/07/2014    Procedure: TRANSESOPHAGEAL ECHOCARDIOGRAM (TEE);  Surgeon: [name], MD;  Location: [facility] ENDOSCOPY;  Service: Cardiovascular;  Laterality: N/A;  . Cardioversion N/A 05/07/2014    Procedure: CARDIOVERSION;  Surgeon: [name], MD;  Location: [facility] ENDOSCOPY;  Service: Cardiovascular;  Laterality: N/A;    reports that he has been smoking Cigarettes.  He has a 55 pack-year smoking history. He has never used smokeless  tobacco. He reports that he drinks about 3.0 oz of alcohol per week. He reports that he does not use illicit drugs. family history includes Breast cancer in his mother; Ovarian cancer in his sister; Stroke in an other family member. There is no history of Colon cancer, Esophageal cancer, Rectal cancer, or Stomach cancer. No Known Allergies]\    Review of Systems  Constitutional: Negative for fever and chills.  HENT: Negative for congestion.   Respiratory: Positive for cough.        Objective:   Physical Exam  Constitutional: He appears well-developed and well-nourished. No distress.  Neck: Neck supple. No thyromegaly present.  Cardiovascular: Normal rate and regular rhythm.   Pulmonary/Chest: Effort normal and breath sounds normal. No respiratory distress. He has no wheezes. He has no rales.          Assessment & Plan:  Cough. Suspect acute viral bronchitis. Nonfocal exam. No wheezing. Follow-up promptly for any fever or worsening symptoms. Otherwise observe.

## 2014-07-23 NOTE — Patient Instructions (Signed)

## 2014-08-16 ENCOUNTER — Encounter: Payer: Self-pay | Admitting: Cardiology

## 2014-08-16 ENCOUNTER — Ambulatory Visit (INDEPENDENT_AMBULATORY_CARE_PROVIDER_SITE_OTHER): Payer: Federal, State, Local not specified - PPO | Admitting: Cardiology

## 2014-08-16 VITALS — BP 122/70 | HR 114 | Ht 71.0 in | Wt 219.1 lb

## 2014-08-16 DIAGNOSIS — I480 Paroxysmal atrial fibrillation: Secondary | ICD-10-CM | POA: Diagnosis not present

## 2014-08-16 DIAGNOSIS — F17210 Nicotine dependence, cigarettes, uncomplicated: Secondary | ICD-10-CM

## 2014-08-16 DIAGNOSIS — R0609 Other forms of dyspnea: Secondary | ICD-10-CM

## 2014-08-16 DIAGNOSIS — I481 Persistent atrial fibrillation: Secondary | ICD-10-CM

## 2014-08-16 DIAGNOSIS — I4892 Unspecified atrial flutter: Secondary | ICD-10-CM | POA: Diagnosis not present

## 2014-08-16 DIAGNOSIS — R0600 Dyspnea, unspecified: Secondary | ICD-10-CM

## 2014-08-16 DIAGNOSIS — E785 Hyperlipidemia, unspecified: Secondary | ICD-10-CM

## 2014-08-16 DIAGNOSIS — I10 Essential (primary) hypertension: Secondary | ICD-10-CM | POA: Diagnosis not present

## 2014-08-16 DIAGNOSIS — G4733 Obstructive sleep apnea (adult) (pediatric): Secondary | ICD-10-CM | POA: Diagnosis not present

## 2014-08-16 DIAGNOSIS — I4819 Other persistent atrial fibrillation: Secondary | ICD-10-CM

## 2014-08-16 DIAGNOSIS — N433 Hydrocele, unspecified: Secondary | ICD-10-CM

## 2014-08-16 MED ORDER — FLECAINIDE ACETATE 100 MG PO TABS: 100.0000 mg | ORAL_TABLET | Freq: Two times a day (BID) | ORAL | Status: DC

## 2014-08-16 NOTE — Patient Instructions (Signed)
Flecainide 2 tablets at bedtime tonight, other wise 100 mg twice a day.  Your physician recommends that you schedule a follow-up appointment in EKG ONLY 08/23/2014 Monday- nurse schedule.  Your physician recommends that you schedule a follow-up appointment in 2 weeks  [facility].  Your physician wants you to follow-up in [place].   You will receive a reminder letter in the mail two months in advance. If you don't receive a letter, please call our office to schedule the follow-up appointment.

## 2014-08-16 NOTE — Progress Notes (Signed)
PCP: [name], MD  Clinic Note: Chief Complaint  Patient presents with  . Shortness of Breath    on exertion and at rest/pt states no chest pain, swelling, SOB  . Atrial Fibrillation   HPI: Ronald [name] is a 69 y.o. male with a PMH below who presents today for 3 month f/u/ recently diagnosed atrial fibrillation/atrial flutter with RVR.  History:  End of January 2016: he had a planned prostate/urology procedure was canceled due to a tachycardia - A. Fib/flutter with rate 100 beats a minute. --> After failure with IV diltiazem to convert he was treated with TEE guided DC CV.  He was converted to a combination of diltiazem and metoprolol  May 13 2014: Hospital followup with Cecilie Kicks, NP -> still in A. Fib/flutter, diltiazem restarted Myoview ordered  05/19/2014: Visit with [name]. Still noted to be in rapid rate of A. Fib. Reportedly was asymptomatic. Plan was to simply use increased dose of beta blocker for rapid heartbeat episodes. Low-dose Lasix continued for potential heart failure.  HR was up a month or so ago - was taking increased BB dose (as a PRN).  Lately, not as bad. Had bought of Bronchitis while in [facility].  Past Medical History  Diagnosis Date  . Hypertension   . Hyperlipidemia   . Gout   . Tinea versicolor   . OSA on CPAP     per study 08-03-2004  Severe OSA  . Allergic rhinitis   . Prostate cancer     Dx  2014--  stage T1c, Gleason 3+3=6, PSA 6.67--  Active surveillance  . Bilateral hydrocele   . History of colon polyps     tubular adenoma 2013  . Sigmoid diverticulosis   . Cigarette smoker two packs a day or less     Prior Cardiac Evaluation and Past Surgical History: Past Surgical History  Procedure Laterality Date  . Colonoscopy  last one 06-07-2011  . Prostate biopsy  05/15/12    Clinically both Lob[place]  . Tonsillectomy  as child  . Laminectomy and microdiscectomy lumbar spine  12-23-2003    Left L5 -- S1  . Laparoscopic  bilateral inguinal hernia repair/  umbilical hernia repair with mesh/  aspiration left hydrocele  07-11-2012  . Transthoracic echocardiogram  05/01/2014    mild LVH; EF 50-55% (cannot measure diastolic Fxn with Afib), Mod LA Dilation, mild MR  . Tee without cardioversion N/A 05/07/2014    Procedure: TRANSESOPHAGEAL ECHOCARDIOGRAM (TEE);  Surgeon: [name], MD;  Location: [facility] ENDOSCOPY;  Service: Cardiovascular;  Laterality: N/A;  . Cardioversion N/A 05/07/2014    Procedure: CARDIOVERSION;  Surgeon: [name], MD;  Location: [place];  Service: Cardiovascular;  Laterality: N/A;  . Nm myoview ltd  05/18/2014    Low risk study. Normal perfusion: No ischemia or infarction. Mild LV dysfunction - 46% (does not correlate with echocardiographic EF of 50-55%)    Interval History: he presents today having just recovered from a bronchitis episode with sinus congestion has been somewhat short of breath. His wife notes though that he has been short of breath with exertion since this atrial fibrillation issue started. He has noted that his heart rate has been up and down quite a bit over the last few months. During his bronchitis episode he had a lot of rapid heartbeats. He just notes that he has a hard time exerting himself without getting dyspneic. Usually at rest he is okay but when he does much more  the walking around the house he would get short of breath when his heart rate goes up. He is not noticing much the way of any swelling/edema or PND, orthopnea type symptoms. He denies any chest tightness or pressure associated with the rapid heartbeats and had a negative Myoview.  He still has some of the anxiety spells that are associated with dyspnea and exacerbation of rapid heartbeats. Has ~ anxiety spells with associated SOB.  More pronounced exertional dyspnea   No resting or exertional chest tightness/pressure.  No PND, orthopnea or edema.  No syncope/near syncope, or TIA/amaurosis fugax  symptoms. No melena, hematochezia, hematuria, or epstaxis. No claudication.  ROS: A comprehensive was performed. Review of Systems  Constitutional: Positive for malaise/fatigue (Just feels tired).  HENT: Positive for congestion. Negative for nosebleeds.   Respiratory: Positive for cough (Recovering from bronchitis). Negative for sputum production.   Cardiovascular: Negative for claudication.  Gastrointestinal: Negative for heartburn and melena.  Genitourinary: Negative for hematuria.  Neurological: Positive for dizziness (Occasionally he exerts himself and his heart rate was) and headaches (From sinus congestion).  Psychiatric/Behavioral: The patient is nervous/anxious (he is still worried about his heart rate going fast).   All other systems reviewed and are negative.   Current Outpatient Prescriptions on File Prior to Visit  Medication Sig Dispense Refill  . apixaban (ELIQUIS) 5 MG TABS tablet Take 1 tablet (5 mg total) by mouth 2 (two) times daily. 180 tablet 1  . atorvastatin (LIPITOR) 10 MG tablet Take 1 tablet (10 mg total) by mouth every evening.    . diltiazem (CARDIZEM CD) 360 MG 24 hr capsule Take 1 capsule (360 mg total) by mouth daily. 90 capsule 1  . fexofenadine (ALLEGRA) 180 MG tablet Take 180 mg by mouth daily.     . fluticasone (FLONASE) 50 MCG/ACT nasal spray Place 2 sprays into both nostrils daily as needed for allergies or rhinitis.    . furosemide (LASIX) 40 MG tablet TAKE 1 TABLET BY MOUTH DAILY 30 tablet 11  . lisinopril (PRINIVIL,ZESTRIL) 20 MG tablet Take 1 tablet (20 mg total) by mouth every evening.    . metoprolol tartrate (LOPRESSOR) 25 MG tablet Take 1 tablet (25 mg total) by mouth 2 (two) times daily. (Patient taking differently: Take 25 mg by mouth. ) 60 tablet 11  . triamcinolone cream (KENALOG) 0.1 % Apply 1 application topically 2 (two) times daily as needed (for rash).     No current facility-administered medications on file prior to visit.   No  Known Allergies   History  Substance Use Topics  . Smoking status: Current Every Day Smoker -- 1.00 packs/day for 55 years    Types: Cigarettes  . Smokeless tobacco: Never Used  . Alcohol Use: 3.0 oz/week    6 Standard drinks or equivalent per week   Family History  Problem Relation Age of Onset  . Stroke      Unknown   . Colon cancer Neg Hx   . Esophageal cancer Neg Hx   . Rectal cancer Neg Hx   . Stomach cancer Neg Hx   . Breast cancer Mother   . Ovarian cancer Sister     Wt Readings from Last 3 Encounters:  08/16/14 99.383 kg (219 lb 1.6 oz)  07/23/14 98.431 kg (217 lb)  05/19/14 96.435 kg (212 lb 9.6 oz)    PHYSICAL EXAM BP 122/70 mmHg  Pulse 114  Ht 5\' 11"  (1.803 m)  Wt 99.383 kg (219 lb 1.6 oz)  BMI  30.57 kg/m2 General appearance: alert, cooperative, appears stated age, no distress and borderline obese HEENT: [place]/AT, EOMI, MMM, anicteric sclera Neck: no adenopathy, no carotid bruit and no JVD Lungs: clear to auscultation bilaterally, normal percussion bilaterally and non-labored Heart: Irreg/Irreg & Rapid; S1& S2 normal, no murmur, click, rub or gallop ; non-displaced PMI Abdomen: soft, non-tender; bowel sounds normal; no masses, no organomegaly; Extremities: extremities normal, atraumatic, no cyanosis, and edema Pulses: 2+ and symmetric;  Neurologic: Mental status: Alert, oriented, thought content appropriate Cranial nerves: normal (II-XII grossly intact)    Adult ECG Report  Rate: 114 ;  Rhythm: atrial fibrillation and with RVR  Narrative Interpretation: left ward axis of -23. Otherwise no ischemic changes. Abnormality is A. Fib with RVR.  Recent Labs: N/A  ASSESSMENT / PLAN: Problem List Items Addressed This Visit    Atrial fibrillation, persistent (Chronic)    He he is not very well rate controlled with beta blocker and calcium channel blocker. It increases beta blocker to 3 times a day via telephonic message for rapid heartbeats. I  am not sure if  it is rapid heartbeats or simply being in atrial fib or flutter that is making him symptomatic.  He is a relatively new diagnosis of A. Fib with a relatively normal echocardiogram and nonischemic Myoview. Based on the fact he is quite symptomatic when in A. Fib and his heart control rate, we discussed different options that he is probably time to try an antiarrhythmic. I would then like for him to be seen in the  [facility] to followup effectiveness of the antiarrhythmic as well as discussed the possibility of ablation procedures versus other antiarrhythmics.  Plan: Continue diltiazem and metoprolol with urine additional dose of beta blocker for rapid heartbeats.  Initiate flecainide with 100 mg twice a day. First dose being tonight at 200 mg x1.  Three-month followup with me preceded by consultation in A. FIB CLINIC.      Relevant Medications   flecainide (TAMBOCOR) 100 MG tablet   Atrial flutter with rapid ventricular response - Primary   Relevant Medications   flecainide (TAMBOCOR) 100 MG tablet   Other Relevant Orders   EKG 12-Lead (Completed)   Ambulatory referral to Cardiology   Cigarette smoker two packs a day or less (Chronic)    He notes that he is trying to cut back. Down to one pack a day from 2 packs per day. He says he is smoking less than that. He is "trying to quit " Smoking cessation instruction/counseling given:  counseled patient on the dangers of tobacco use, advised patient to stop smoking, and reviewed strategies to maximize success       Essential hypertension (Chronic)    Control on current meds      Relevant Medications   flecainide (TAMBOCOR) 100 MG tablet   Other Relevant Orders   EKG 12-Lead (Completed)   Ambulatory referral to Cardiology   Exertional dyspnea    He has a nonischemic Myoview and relatively normal echocardiogram. Showed A. Fib, diastolic function cannot be assessed accurately. Dyspnea could be attributed to lack of atrial  kick exacerbating diastolic failure symptoms versus simply viewed to be symptomatic with the A. Fib with borderline RPR.  He also is exacerbated somewhat now with sinus congestion and bronchitis episode. He did not notice being so symptomatic before. However since he is now starting to show signs of symptoms I think it's reasonable to pursue an antiarrhythmic approach.      Hydrocele  He has a chronic hydrocele/inguinal hernia he would like to have repaired. Unfortunately he is a thin but controlled for that to happen. I would like to see how he does on flecainide CPT out of it. Simply because there could be potential chemical cardioversion with flecainide which would then need for it to be followed by at least 4 weeks as directed anticoagulation, millimeter plate at least that long before he can undergo surgery. Once we are no longer actively try to convert him into atrial fibrillation/flutter, he could stop his Eliquis long enough to have the procedure.      Hyperlipidemia (Chronic)   Relevant Medications   flecainide (TAMBOCOR) 100 MG tablet   Other Relevant Orders   EKG 12-Lead (Completed)   Ambulatory referral to Cardiology   Obstructive sleep apnea (Chronic)   Relevant Orders   EKG 12-Lead (Completed)   Ambulatory referral to Cardiology    Other Visit Diagnoses    Paroxysmal a-fib        Relevant Medications    flecainide (TAMBOCOR) 100 MG tablet    Other Relevant Orders    EKG 12-Lead (Completed)    Ambulatory referral to Cardiology       boneFlecainide 2 tablets at bedtime tonight, other wise 100 mg twice a day.  Your physician recommends that you schedule a follow-up appointment in EKG ONLY 08/23/2014 Monday- nurse schedule.  Your physician recommends that you schedule a follow-up appointment in 2 weeks  [facility] CLINIC.  Followup: we will followup with me. He is referred to the Atrial Fibrillation Clinic.    Leonie Man, M.D., M.S. Interventional Cardiologist    Pager # [phone]

## 2014-08-18 ENCOUNTER — Encounter: Payer: Self-pay | Admitting: Cardiology

## 2014-08-18 DIAGNOSIS — R0609 Other forms of dyspnea: Secondary | ICD-10-CM | POA: Insufficient documentation

## 2014-08-18 DIAGNOSIS — R0600 Dyspnea, unspecified: Secondary | ICD-10-CM | POA: Insufficient documentation

## 2014-08-18 NOTE — Assessment & Plan Note (Signed)
He he is not very well rate controlled with beta blocker and calcium channel blocker. It increases beta blocker to 3 times a day via telephonic message for rapid heartbeats. I  am not sure if it is rapid heartbeats or simply being in atrial fib or flutter that is making him symptomatic.  He is a relatively new diagnosis of A. Fib with a relatively normal echocardiogram and nonischemic Myoview. Based on the fact he is quite symptomatic when in A. Fib and his heart control rate, we discussed different options that he is probably time to try an antiarrhythmic. I would then like for him to be seen in the  [facility] to followup effectiveness of the antiarrhythmic as well as discussed the possibility of ablation procedures versus other antiarrhythmics.  Plan: Continue diltiazem and metoprolol with urine additional dose of beta blocker for rapid heartbeats.  Initiate flecainide with 100 mg twice a day. First dose being tonight at 200 mg x1.  Three-month followup with me preceded by consultation in A. FIB CLINIC.

## 2014-08-18 NOTE — Assessment & Plan Note (Signed)
He notes that he is trying to cut back. Down to one pack a day from 2 packs per day. He says he is smoking less than that. He is "trying to quit " Smoking cessation instruction/counseling given:  counseled patient on the dangers of tobacco use, advised patient to stop smoking, and reviewed strategies to maximize success

## 2014-08-18 NOTE — Assessment & Plan Note (Signed)
He has a nonischemic Myoview and relatively normal echocardiogram. Showed A. Fib, diastolic function cannot be assessed accurately. Dyspnea could be attributed to lack of atrial kick exacerbating diastolic failure symptoms versus simply viewed to be symptomatic with the A. Fib with borderline RPR.  He also is exacerbated somewhat now with sinus congestion and bronchitis episode. He did not notice being so symptomatic before. However since he is now starting to show signs of symptoms I think it's reasonable to pursue an antiarrhythmic approach.

## 2014-08-18 NOTE — Assessment & Plan Note (Signed)
Control on current meds

## 2014-08-18 NOTE — Assessment & Plan Note (Signed)
He has a chronic hydrocele/inguinal hernia he would like to have repaired. Unfortunately he is a thin but controlled for that to happen. I would like to see how he does on flecainide CPT out of it. Simply because there could be potential chemical cardioversion with flecainide which would then need for it to be followed by at least 4 weeks as directed anticoagulation, millimeter plate at least that long before he can undergo surgery. Once we are no longer actively try to convert him into atrial fibrillation/flutter, he could stop his Eliquis long enough to have the procedure.

## 2014-08-20 ENCOUNTER — Telehealth: Payer: Self-pay | Admitting: Cardiology

## 2014-08-20 NOTE — Telephone Encounter (Signed)
New message   Patient is calling in concern to medication issues. Amlodepene and Lisinopril and the two BP meds that he was on and he says the doctor told him to get off one of them and the patient does no know which one.   Please give patient a call.

## 2014-08-20 NOTE — Telephone Encounter (Signed)
SPOKE TO PATIENT PER OFFICE NOTE 08/16/2014- AMLODIPINE WAS DISCONTINUE. PATIENT VERBALIZED UNDERSTANDING.

## 2014-08-23 ENCOUNTER — Ambulatory Visit (INDEPENDENT_AMBULATORY_CARE_PROVIDER_SITE_OTHER): Payer: Federal, State, Local not specified - PPO | Admitting: *Deleted

## 2014-08-23 VITALS — HR 85

## 2014-08-23 DIAGNOSIS — I4891 Unspecified atrial fibrillation: Secondary | ICD-10-CM

## 2014-08-23 NOTE — Patient Instructions (Signed)
Keep appointment for A Fib clinic.

## 2014-08-23 NOTE — Progress Notes (Signed)
Pt came for EKG check - per [name], meds adjusted last week d/t A Fib w/ rapid rate. Pt was advised to come today for EKG check, f/u in A Fib clinic in 2 weeks. Has appt for A fib clinic.  EKG was performed in exam room, Dr. Martinique (DoD) noted findings of controlled rate A Fib, signed EKG printout. I placed in box for medical records filing.

## 2014-08-25 DIAGNOSIS — N433 Hydrocele, unspecified: Secondary | ICD-10-CM | POA: Diagnosis not present

## 2014-08-25 DIAGNOSIS — C61 Malignant neoplasm of prostate: Secondary | ICD-10-CM | POA: Diagnosis not present

## 2014-09-07 ENCOUNTER — Encounter (HOSPITAL_COMMUNITY): Payer: Self-pay | Admitting: Nurse Practitioner

## 2014-09-07 ENCOUNTER — Ambulatory Visit (HOSPITAL_COMMUNITY)
Admission: RE | Admit: 2014-09-07 | Discharge: 2014-09-07 | Disposition: A | Discharge status: Home / Self Care | Payer: Federal, State, Local not specified - PPO | Source: Ambulatory Visit | Attending: Nurse Practitioner | Admitting: Nurse Practitioner

## 2014-09-07 VITALS — BP 140/80 | HR 86 | Ht 71.0 in | Wt 223.2 lb

## 2014-09-07 DIAGNOSIS — G4733 Obstructive sleep apnea (adult) (pediatric): Secondary | ICD-10-CM | POA: Diagnosis not present

## 2014-09-07 DIAGNOSIS — F17210 Nicotine dependence, cigarettes, uncomplicated: Secondary | ICD-10-CM | POA: Insufficient documentation

## 2014-09-07 DIAGNOSIS — I10 Essential (primary) hypertension: Secondary | ICD-10-CM | POA: Insufficient documentation

## 2014-09-07 DIAGNOSIS — I481 Persistent atrial fibrillation: Secondary | ICD-10-CM | POA: Diagnosis not present

## 2014-09-07 DIAGNOSIS — E785 Hyperlipidemia, unspecified: Secondary | ICD-10-CM | POA: Diagnosis not present

## 2014-09-07 DIAGNOSIS — I4819 Other persistent atrial fibrillation: Secondary | ICD-10-CM

## 2014-09-07 LAB — BASIC METABOLIC PANEL
Anion gap: 11 (ref 5–15)
BUN: 15 mg/dL (ref 6–20)
CO2: 25 mmol/L (ref 22–32)
Calcium: 9.1 mg/dL (ref 8.9–10.3)
Chloride: 103 mmol/L (ref 101–111)
Creatinine, Ser: 1.02 mg/dL (ref 0.61–1.24)
Glucose, Bld: 118 mg/dL — ABNORMAL HIGH (ref 65–99)
Potassium: 3.8 mmol/L (ref 3.5–5.1)
SODIUM: 139 mmol/L (ref 135–145)

## 2014-09-07 LAB — CBC
HCT: 47.4 % (ref 39.0–52.0)
Hemoglobin: 16.4 g/dL (ref 13.0–17.0)
MCH: 33 pg (ref 26.0–34.0)
MCHC: 34.6 g/dL (ref 30.0–36.0)
MCV: 95.4 fL (ref 78.0–100.0)
Platelets: 175 10*3/uL (ref 150–400)
RBC: 4.97 MIL/uL (ref 4.22–5.81)
RDW: 13.8 % (ref 11.5–15.5)
WBC: 6.8 10*3/uL (ref 4.0–10.5)

## 2014-09-07 NOTE — Patient Instructions (Signed)
Your physician has recommended that you have a Cardioversion (DCCV). Electrical Cardioversion uses a jolt of electricity to your heart either through paddles or wired patches attached to your chest. This is a controlled, usually prescheduled, procedure. Defibrillation is done under light anesthesia in the hospital, and you usually go home the day of the procedure. This is done to get your heart back into a normal rhythm. You are not awake for the procedure. Please see the instruction sheet given to you today.  Cardioversion is scheduled for Thursday, June 2nd @ 9am -- Please arrive at 8am at the [facility] and go to admitting.  Do not eat or drink anything after midnight on Wednesday night.  Take your medications with a sip of water prior to arrival.  You will not be able to drive home after the procedure.

## 2014-09-07 NOTE — Progress Notes (Signed)
Patient ID: Ronald [name], male   DOB: 08/14/1945, 69 y.o.   MRN: [id number]    Primary Care Physician: [name], MD Primary Cardiologist: [name] Primary Electrophysiologist: Referring Physician:Dr. Feliz Beam is a 69 y.o. male with a h/o persistent atrial fibrillation who presents for consultation in the [facility].  The patient was initially diagnosed with atrial fibrillation  in January when he presented for surgery for bilateral hydrocelectomy. Had v rate of 150 bpm and surgery was cancelled. He had a successful DCCV, started on rate control, apixaban and had ERAF. He is for the most part is asymptomatic. He iw slightly aware of some vague chest tightness intermittently and some increase in shortness of breath but does not feel the irregularity. He was seen by [name] in May and was started on flecainide and f/u EKG in that office continued to show afib. Today's EKG also reveals afib, rate controlled.  Review of lifestyle reveals OSA and pt is compliant with CPAP but it has been awhile since he has underwent a two week download.He does see Dr. Gwenette Greet in a week and will discuss with him if this would be appropriate to have done. He has a long term smoking habit and has been trying to cut back. He drinks 2 alcoholic beverages a week and we discussed trying to keep to two drinks a week. Currently does not have a regular exercise program and his weight fluctuates by several pounds a day. He does not use the salt shaker but is careful with salty foods. BP controlled today but was showing some elevation until he started rate control meds.  Has a chadsvasc score of 2(HTN/AGE) and is being compliant with eliquis without bleeding. He is tolerating flecainide without side effects. Prior echo showed mod left atrium enlargement and normal LV function. Recent myoview without ischemia.  Today, he denies symptoms of palpitations, chest pain,  shortness of breath, orthopnea, PND, lower extremity edema, dizziness, presyncope, syncope, snoring, daytime somnolence, bleeding, or neurologic sequela. The patient is tolerating medications without difficulties and is otherwise without complaint today.    Atrial Fibrillation Risk Factors:  he does have symptoms or diagnosis of sleep apnea. he is compliant with CPAP therapy.  he does not have a history of rheumatic fever.  he does have a history of alcohol use.  he has a BMI of Body mass index is 31.14 kg/(m^2).Marland Kitchen Filed Weights   09/07/14 1128  Weight: 223 lb 3.2 oz (101.243 kg)    LA size: moderate enlargement   Atrial Fibrillation Management history:  Previous antiarrhythmic drugs:flecainide  Previous cardioversions: x1 in January 2016  Previous ablations: none  CHADS2VASC score: 2  Anticoagulation history: Eliquis   Past Medical History  Diagnosis Date  . Hypertension   . Hyperlipidemia   . Gout   . Tinea versicolor   . OSA on CPAP     per study 08-03-2004  Severe OSA  . Allergic rhinitis   . Prostate cancer     Dx  2014--  stage T1c, Gleason 3+3=6, PSA 6.67--  Active surveillance  . Bilateral hydrocele   . History of colon polyps     tubular adenoma 2013  . Sigmoid diverticulosis   . Cigarette smoker two packs a day or less    Past Surgical History  Procedure Laterality Date  . Colonoscopy  last one 06-07-2011  . Prostate biopsy  05/15/12    Clinically both Lobes  . Tonsillectomy  as child  . Laminectomy and microdiscectomy lumbar spine  12-23-2003    Left L5 -- S1  . Laparoscopic bilateral inguinal hernia repair/  umbilical hernia repair with mesh/  aspiration left hydrocele  07-11-2012  . Transthoracic echocardiogram  05/01/2014    mild LVH; EF 50-55% (cannot measure diastolic Fxn with Afib), Mod LA Dilation, mild MR  . Tee without cardioversion N/A 05/07/2014    Procedure: TRANSESOPHAGEAL ECHOCARDIOGRAM (TEE);  Surgeon: [name], MD;  Location: [facility]  ENDOSCOPY;  Service: Cardiovascular;  Laterality: N/A;  . Cardioversion N/A 05/07/2014    Procedure: CARDIOVERSION;  Surgeon: [name], MD;  Location: [facility];  Service: Cardiovascular;  Laterality: N/A;  . Nm myoview ltd  05/18/2014    Low risk study. Normal perfusion: No ischemia or infarction. Mild LV dysfunction - 46% (does not correlate with echocardiographic EF of 50-55%)    Current Outpatient Prescriptions  Medication Sig Dispense Refill  . apixaban (ELIQUIS) 5 MG TABS tablet Take 1 tablet (5 mg total) by mouth 2 (two) times daily. 180 tablet 1  . atorvastatin (LIPITOR) 10 MG tablet Take 1 tablet (10 mg total) by mouth every evening.    . diltiazem (CARDIZEM CD) 360 MG 24 hr capsule Take 1 capsule (360 mg total) by mouth daily. 90 capsule 1  . fexofenadine (ALLEGRA) 180 MG tablet Take 180 mg by mouth daily.     . flecainide (TAMBOCOR) 100 MG tablet Take 1 tablet (100 mg total) by mouth 2 (two) times daily. 60 tablet 4  . fluticasone (FLONASE) 50 MCG/ACT nasal spray Place 2 sprays into both nostrils daily as needed for allergies or rhinitis.    . furosemide (LASIX) 40 MG tablet TAKE 1 TABLET BY MOUTH DAILY 30 tablet 11  . lisinopril (PRINIVIL,ZESTRIL) 20 MG tablet Take 1 tablet (20 mg total) by mouth every evening.    . metoprolol tartrate (LOPRESSOR) 25 MG tablet Take 1 tablet (25 mg total) by mouth 2 (two) times daily. (Patient taking differently: Take 25 mg by mouth. ) 60 tablet 11  . triamcinolone cream (KENALOG) 0.1 % Apply 1 application topically 2 (two) times daily as needed (for rash).     No current facility-administered medications for this encounter.    No Known Allergies  History   Social History  . Marital Status: Married    Spouse Name: N/A  . Number of Children: N/A  . Years of Education: N/A   Occupational History  . Sales    Social History Main Topics  . Smoking status: Current Every Day Smoker -- 1.00 packs/day for 55 years    Types: Cigarettes    . Smokeless tobacco: Never Used  . Alcohol Use: 3.0 oz/week    6 Standard drinks or equivalent per week  . Drug Use: No  . Sexual Activity: Not on file   Other Topics Concern  . Not on file   Social History Narrative   Married 25 years   2 children but not with this wife    Family History  Problem Relation Age of Onset  . Stroke      Unknown   . Colon cancer Neg Hx   . Esophageal cancer Neg Hx   . Rectal cancer Neg Hx   . Stomach cancer Neg Hx   . Breast cancer Mother   . Ovarian cancer Sister    The patient does not have a history of early familial atrial fibrillation or other arrhythmias.  ROS- All systems are reviewed  and negative except as per the HPI above.  Physical Exam: Filed Vitals:   09/07/14 1128  BP: 140/80  Pulse: 86  Height: 5\' 11"  (1.803 m)  Weight: 223 lb 3.2 oz (101.243 kg)    GEN- The patient is well appearing, alert and oriented x 3 today.   Head- normocephalic, atraumatic Eyes-  Sclera clear, conjunctiva pink Ears- hearing intact Oropharynx- clear Neck- supple, no JVP Lymph- no cervical lymphadenopathy Lungs- Clear to ausculation bilaterally, normal work of breathing Heart- Regular rate and rhythm, no murmurs, rubs or gallops, PMI not laterally displaced GI- soft, NT, ND, + BS Extremities- no clubbing, cyanosis, or edema MS- no significant deformity or atrophy Skin- no rash or lesion Psych- euthymic mood, full affect Neuro- strength and sensation are intact  EKG Afib at 86 bpm, QRS 108 bpm, Qtc 425ms Echo- Normal LV function; moderate LAE; mild RAE; mild MR. Myoview-Overall Impression: Low risk stress nuclear study Nl perfusion with mild LV dysfunction. Suggest a 2D to correlate .  LV Wall Motion: Mild LV dysfunction  Epic records are reviewed at length today  Assessment and Plan:  1. Atrial fibrillation The patient has mildly Symptomatic paroxysmal/ persistent atrial fibrillation but feel that pt will be better off in the long  run if SR can be achieved. So far he has failed flecainide.  The patients CHAD2VASC score is 2.  he is  appropriately anticoagulated at this time. The patient is adequately rate controlled with cardizem, metoprolol. Antiarrhythmic therapy to dates has included flecainide  The patients left atrial size is moderately enlarged  Additional echo findings include normal LV function A long discussion with the patient was had today regarding therapeutic strategies.  Extensive discussion of lifestyle modification including quit smoking, begin progressive daily aerobic exercise program, attempt to lose weight, decrease or avoid alcohol intake, reduce salt in diet and cooking, improve dietary compliance and continue current medications was also discussed.  Presently, our recommendations include :  1. Persistent afib Appears that flecainide has not converted pt so will set up for DCCV on flecainide. Reminded not to miss any doses of DOAC.  Labs today.  Hopefully after cardioversion can maintain SR on flecainide and rate control but if not, pt is interested to discuss ablation and will go ahead and schedule an appointment with Dr. Rayann Heman mid summer.  2. Obesity As above, lifestyle modification was discussed at length including regular exercise and weight reduction.  3. Obstructive sleep apnea The importance of adequate treatment of sleep apnea was discussed today in order to improve our ability to maintain sinus rhythm long term.   F/u with [name] as scheduled in August and Afib clinic after cardioversion.    [name] NP  Nurse Practitioner, [facility] 09/07/2014 1:09 PM

## 2014-09-08 ENCOUNTER — Other Ambulatory Visit (HOSPITAL_COMMUNITY): Payer: Self-pay | Admitting: *Deleted

## 2014-09-09 ENCOUNTER — Encounter (HOSPITAL_COMMUNITY): Payer: Self-pay | Admitting: Certified Registered Nurse Anesthetist

## 2014-09-09 ENCOUNTER — Ambulatory Visit (HOSPITAL_COMMUNITY): Payer: Federal, State, Local not specified - PPO | Admitting: Anesthesiology

## 2014-09-09 ENCOUNTER — Encounter (HOSPITAL_COMMUNITY)
Admission: RE | Disposition: A | Discharge status: Home / Self Care | Payer: Self-pay | Source: Ambulatory Visit | Attending: Cardiology

## 2014-09-09 ENCOUNTER — Ambulatory Visit (HOSPITAL_COMMUNITY)
Admission: RE | Admit: 2014-09-09 | Discharge: 2014-09-09 | Disposition: A | Discharge status: Home / Self Care | Payer: Federal, State, Local not specified - PPO | Source: Ambulatory Visit | Attending: Cardiology | Admitting: Cardiology

## 2014-09-09 DIAGNOSIS — I10 Essential (primary) hypertension: Secondary | ICD-10-CM | POA: Insufficient documentation

## 2014-09-09 DIAGNOSIS — G4733 Obstructive sleep apnea (adult) (pediatric): Secondary | ICD-10-CM | POA: Insufficient documentation

## 2014-09-09 DIAGNOSIS — Z6831 Body mass index (BMI) 31.0-31.9, adult: Secondary | ICD-10-CM | POA: Diagnosis not present

## 2014-09-09 DIAGNOSIS — K5730 Diverticulosis of large intestine without perforation or abscess without bleeding: Secondary | ICD-10-CM | POA: Insufficient documentation

## 2014-09-09 DIAGNOSIS — Z8546 Personal history of malignant neoplasm of prostate: Secondary | ICD-10-CM | POA: Diagnosis not present

## 2014-09-09 DIAGNOSIS — J309 Allergic rhinitis, unspecified: Secondary | ICD-10-CM | POA: Diagnosis not present

## 2014-09-09 DIAGNOSIS — I4891 Unspecified atrial fibrillation: Secondary | ICD-10-CM | POA: Diagnosis not present

## 2014-09-09 DIAGNOSIS — F17210 Nicotine dependence, cigarettes, uncomplicated: Secondary | ICD-10-CM | POA: Diagnosis not present

## 2014-09-09 DIAGNOSIS — Z79899 Other long term (current) drug therapy: Secondary | ICD-10-CM | POA: Diagnosis not present

## 2014-09-09 DIAGNOSIS — E785 Hyperlipidemia, unspecified: Secondary | ICD-10-CM | POA: Insufficient documentation

## 2014-09-09 DIAGNOSIS — E669 Obesity, unspecified: Secondary | ICD-10-CM | POA: Insufficient documentation

## 2014-09-09 DIAGNOSIS — Z86010 Personal history of colonic polyps: Secondary | ICD-10-CM | POA: Insufficient documentation

## 2014-09-09 DIAGNOSIS — M109 Gout, unspecified: Secondary | ICD-10-CM | POA: Insufficient documentation

## 2014-09-09 DIAGNOSIS — I480 Paroxysmal atrial fibrillation: Secondary | ICD-10-CM | POA: Insufficient documentation

## 2014-09-09 HISTORY — PX: CARDIOVERSION: SHX1299

## 2014-09-09 SURGERY — CARDIOVERSION
Anesthesia: Monitor Anesthesia Care

## 2014-09-09 MED ORDER — LIDOCAINE HCL (CARDIAC) 20 MG/ML IV SOLN
INTRAVENOUS | Status: DC | PRN
  Administered 2014-09-09: 100 mg via INTRAVENOUS

## 2014-09-09 MED ORDER — SODIUM CHLORIDE 0.9 % IV SOLN
INTRAVENOUS | Status: DC
  Administered 2014-09-09: 500 mL via INTRAVENOUS

## 2014-09-09 MED ORDER — PROPOFOL 10 MG/ML IV BOLUS
INTRAVENOUS | Status: DC | PRN
  Administered 2014-09-09: 100 mg via INTRAVENOUS

## 2014-09-09 NOTE — Discharge Instructions (Signed)

## 2014-09-09 NOTE — Anesthesia Postprocedure Evaluation (Signed)
  Anesthesia Post-op Note  Patient: Ronald [name]  Procedure(s) Performed: Procedure(s) (LRB): CARDIOVERSION (N/A)  Patient Location: PACU  Anesthesia Type: MAC  Level of Consciousness: awake and alert   Airway and Oxygen Therapy: Patient Spontanous Breathing  Post-op Pain: mild  Post-op Assessment: Post-op Vital signs reviewed, Patient's Cardiovascular Status Stable, Respiratory Function Stable, Patent Airway and No signs of Nausea or vomiting  Last Vitals:  Filed Vitals:   09/09/14 0759  BP: 137/83  Temp: 36.6 C  Resp: 21    Post-op Vital Signs: stable   Complications: No apparent anesthesia complications

## 2014-09-09 NOTE — H&P (Signed)
[facility]  09/07/2014 11:30 AM  ATRIAL FIB OFFICE VISIT  MRN:  [id number]   Description: 69 year old Ronald [name]  Provider: Sherran Needs, NP  Department: [facility]       Diagnoses     Atrial fibrillation, persistent - Primary    ICD-9-CM: 427.31 ICD-10-CM: I48.1       Reason for Visit     Atrial Fibrillation    Reason for Visit History        Current Vitals  Most recent update: 09/07/2014 11:30 AM by Juluis Mire, RN    BP Pulse Ht Wt BMI    140/80 mmHg 86 5\' 11"  (1.803 m) 223 lb 3.2 oz (101.243 kg) 31.14 kg/m2      Progress Notes      Sherran Needs, NP at 09/07/2014 1:08 PM     Status: Signed       Expand All Collapse All   Patient ID: Ronald [name], Ronald [name] DOB: [birth year], 69 y.o. MRN: [id number]    Primary Care Physician: [name], MD Primary Cardiologist: [name] Primary Electrophysiologist: Referring Physician:Dr. Feliz Beam is a 69 y.o. Ronald [name] with a h/o persistent atrial fibrillation who presents for consultation in the [facility]. The patient was initially diagnosed with atrial fibrillation in January when he presented for surgery for bilateral hydrocelectomy. Had v rate of 150 bpm and surgery was cancelled. He had a successful DCCV, started on rate control, apixaban and had ERAF. He is for the most part is asymptomatic. He iw slightly aware of some vague chest tightness intermittently and some increase in shortness of breath but does not feel the irregularity. He was seen by [name] in May and was started on flecainide and f/u EKG in that office continued to show afib. Today's EKG also reveals afib, rate controlled.  Review of lifestyle reveals OSA and pt is compliant with CPAP but it has been awhile since he has underwent a two week download.He does see Dr. Gwenette Greet in a week and will discuss with him if this would be appropriate to have done. He has a long term smoking habit  and has been trying to cut back. He drinks 2 alcoholic beverages a week and we discussed trying to keep to two drinks a week. Currently does not have a regular exercise program and his weight fluctuates by several pounds a day. He does not use the salt shaker but is careful with salty foods. BP controlled today but was showing some elevation until he started rate control meds.  Has a chadsvasc score of 2(HTN/AGE) and is being compliant with eliquis without bleeding. He is tolerating flecainide without side effects. Prior echo showed mod left atrium enlargement and normal LV function. Recent myoview without ischemia.  Today, he denies symptoms of palpitations, chest pain, shortness of breath, orthopnea, PND, lower extremity edema, dizziness, presyncope, syncope, snoring, daytime somnolence, bleeding, or neurologic sequela. The patient is tolerating medications without difficulties and is otherwise without complaint today.    Atrial Fibrillation Risk Factors:  he does have symptoms or diagnosis of sleep apnea. he is compliant with CPAP therapy.  he does not have a history of rheumatic fever.  he does have a history of alcohol use.  he has a BMI of Body mass index is 31.14 kg/(m^2).Marland Kitchen Filed Weights   09/07/14 1128  Weight: 223 lb 3.2 oz (101.243 kg)    LA size: moderate enlargement   Atrial Fibrillation Management history:  Previous antiarrhythmic drugs:flecainide  Previous cardioversions: x1 in January 2016  Previous ablations: none  CHADS2VASC score: 2  Anticoagulation history: Eliquis   Past Medical History  Diagnosis Date  . Hypertension   . Hyperlipidemia   . Gout   . Tinea versicolor   . OSA on CPAP     per study 08-03-2004 Severe OSA  . Allergic rhinitis   . Prostate cancer     Dx 2014-- stage T1c, Gleason 3+3=6, PSA 6.67-- Active surveillance  . Bilateral hydrocele   . History of colon polyps     tubular adenoma  2013  . Sigmoid diverticulosis   . Cigarette smoker two packs a day or less    Past Surgical History  Procedure Laterality Date  . Colonoscopy  last one 06-07-2011  . Prostate biopsy  05/15/12    Clinically both Lobes  . Tonsillectomy  as child  . Laminectomy and microdiscectomy lumbar spine  12-23-2003    Left L5 -- S1  . Laparoscopic bilateral inguinal hernia repair/ umbilical hernia repair with mesh/ aspiration left hydrocele  07-11-2012  . Transthoracic echocardiogram  05/01/2014    mild LVH; EF 50-55% (cannot measure diastolic Fxn with Afib), Mod LA Dilation, mild MR  . Tee without cardioversion N/A 05/07/2014    Procedure: TRANSESOPHAGEAL ECHOCARDIOGRAM (TEE); Surgeon: [name], MD; Location: [facility] ENDOSCOPY; Service: Cardiovascular; Laterality: N/A;  . Cardioversion N/A 05/07/2014    Procedure: CARDIOVERSION; Surgeon: [name], MD; Location: [place]; Service: Cardiovascular; Laterality: N/A;  . Nm myoview ltd  05/18/2014    Low risk study. Normal perfusion: No ischemia or infarction. Mild LV dysfunction - 46% (does not correlate with echocardiographic EF of 50-55%)    Current Outpatient Prescriptions  Medication Sig Dispense Refill  . apixaban (ELIQUIS) 5 MG TABS tablet Take 1 tablet (5 mg total) by mouth 2 (two) times daily. 180 tablet 1  . atorvastatin (LIPITOR) 10 MG tablet Take 1 tablet (10 mg total) by mouth every evening.    . diltiazem (CARDIZEM CD) 360 MG 24 hr capsule Take 1 capsule (360 mg total) by mouth daily. 90 capsule 1  . fexofenadine (ALLEGRA) 180 MG tablet Take 180 mg by mouth daily.     . flecainide (TAMBOCOR) 100 MG tablet Take 1 tablet (100 mg total) by mouth 2 (two) times daily. 60 tablet 4  . fluticasone (FLONASE) 50 MCG/ACT nasal spray Place 2 sprays into both nostrils daily as needed for allergies or rhinitis.    . furosemide (LASIX) 40  MG tablet TAKE 1 TABLET BY MOUTH DAILY 30 tablet 11  . lisinopril (PRINIVIL,ZESTRIL) 20 MG tablet Take 1 tablet (20 mg total) by mouth every evening.    . metoprolol tartrate (LOPRESSOR) 25 MG tablet Take 1 tablet (25 mg total) by mouth 2 (two) times daily. (Patient taking differently: Take 25 mg by mouth. ) 60 tablet 11  . triamcinolone cream (KENALOG) 0.1 % Apply 1 application topically 2 (two) times daily as needed (for rash).     No current facility-administered medications for this encounter.    No Known Allergies  History   Social History  . Marital Status: Married    Spouse Name: N/A  . Number of Children: N/A  . Years of Education: N/A   Occupational History  . Sales    Social History Main Topics  . Smoking status: Current Every Day Smoker -- 1.00 packs/day for 55 years    Types: Cigarettes  . Smokeless tobacco: Never Used  .  Alcohol Use: 3.0 oz/week    6 Standard drinks or equivalent per week  . Drug Use: No  . Sexual Activity: Not on file   Other Topics Concern  . Not on file   Social History Narrative   Married 25 years   2 children but not with this wife    Family History  Problem Relation Age of Onset  . Stroke      Unknown   . Colon cancer Neg Hx   . Esophageal cancer Neg Hx   . Rectal cancer Neg Hx   . Stomach cancer Neg Hx   . Breast cancer Mother   . Ovarian cancer Sister    The patient does not have a history of early familial atrial fibrillation or other arrhythmias.  ROS- All systems are reviewed and negative except as per the HPI above.  Physical Exam: Filed Vitals:   09/07/14 1128  BP: 140/80  Pulse: 86  Height: 5\' 11"  (1.803 m)  Weight: 223 lb 3.2 oz (101.243 kg)    GEN- The patient is well appearing, alert and oriented x 3 today.  Head- normocephalic, atraumatic Eyes- Sclera clear, conjunctiva  pink Ears- hearing intact Oropharynx- clear Neck- supple, no JVP Lymph- no cervical lymphadenopathy Lungs- Clear to ausculation bilaterally, normal work of breathing Heart- Regular rate and rhythm, no murmurs, rubs or gallops, PMI not laterally displaced GI- soft, NT, ND, + BS Extremities- no clubbing, cyanosis, or edema MS- no significant deformity or atrophy Skin- no rash or lesion Psych- euthymic mood, full affect Neuro- strength and sensation are intact  EKG Afib at 86 bpm, QRS 108 bpm, Qtc 455ms Echo- Normal LV function; moderate LAE; mild RAE; mild MR. Myoview-Overall Impression: Low risk stress nuclear study Nl perfusion with mild LV dysfunction. Suggest a 2D to correlate .  LV Wall Motion: Mild LV dysfunction  Epic records are reviewed at length today  Assessment and Plan:  1. Atrial fibrillation The patient has mildly Symptomatic paroxysmal/ persistent atrial fibrillation but feel that pt will be better off in the long run if SR can be achieved. So far he has failed flecainide. The patients CHAD2VASC score is 2. he is appropriately anticoagulated at this time. The patient is adequately rate controlled with cardizem, metoprolol. Antiarrhythmic therapy to dates has included flecainide  The patients left atrial size is moderately enlarged Additional echo findings include normal LV function A long discussion with the patient was had today regarding therapeutic strategies. Extensive discussion of lifestyle modification including quit smoking, begin progressive daily aerobic exercise program, attempt to lose weight, decrease or avoid alcohol intake, reduce salt in diet and cooking, improve dietary compliance and continue current medications was also discussed.  Presently, our recommendations include :  1. Persistent afib Appears that flecainide has not converted pt so will set up for DCCV on flecainide. Reminded not to miss any doses of DOAC.  Labs today. Hopefully  after cardioversion can maintain SR on flecainide and rate control but if not, pt is interested to discuss ablation and will go ahead and schedule an appointment with Dr. Rayann Heman mid summer.  2. Obesity As above, lifestyle modification was discussed at length including regular exercise and weight reduction.  3. Obstructive sleep apnea The importance of adequate treatment of sleep apnea was discussed today in order to improve our ability to maintain sinus rhythm long term.   F/u with [name] as scheduled in August and Afib clinic after cardioversion.    [name] NP  Nurse  Practitioner, [facility] 09/07/2014 1:09 PM       For DCCV; patient has been taking apixaban. No changes Kirk Ruths

## 2014-09-09 NOTE — Transfer of Care (Signed)
Immediate Anesthesia Transfer of Care Note  Patient: Ronald [name]  Procedure(s) Performed: Procedure(s): CARDIOVERSION (N/A)  Patient Location: PACU and Endoscopy Unit  Anesthesia Type:MAC  Level of Consciousness: awake, alert , oriented and patient cooperative  Airway & Oxygen Therapy: Patient Spontanous Breathing and Patient connected to nasal cannula oxygen  Post-op Assessment: Report given to RN, Post -op Vital signs reviewed and stable and Patient moving all extremities  Post vital signs: Reviewed and stable  Complications: No apparent anesthesia complications

## 2014-09-09 NOTE — Procedures (Signed)
Electrical Cardioversion Procedure Note DURWARD MATRANGA [id number] [birth year]  Procedure: Electrical Cardioversion Indications:  Atrial Fibrillation  Procedure Details Consent: Risks of procedure as well as the alternatives and risks of each were explained to the (patient/caregiver).  Consent for procedure obtained. Time Out: Verified patient identification, verified procedure, site/side was marked, verified correct patient position, special equipment/implants available, medications/allergies/relevent history reviewed, required imaging and test results available.  Performed  Patient placed on cardiac monitor, pulse oximetry, supplemental oxygen as necessary.  Sedation given: Patient sedated by anesthesia with lidocaine 100 mg and diprovan 100 mg IV. Pacer pads placed anterior and posterior chest.  Cardioverted 1 time(s).  Cardioverted at 120J.  Evaluation Findings: Post procedure EKG shows: NSR Complications: None Patient did tolerate procedure well.   Kirk Ruths 09/09/2014, 7:55 AM

## 2014-09-09 NOTE — Anesthesia Procedure Notes (Signed)
Procedure Name: MAC Date/Time: 09/09/2014 9:04 AM Performed by: Lowella Dell Pre-anesthesia Checklist: Patient identified, Emergency Drugs available, Suction available, Patient being monitored and Timeout performed Patient Re-evaluated:Patient Re-evaluated prior to inductionOxygen Delivery Method: Ambu bag Preoxygenation: Pre-oxygenation with 100% oxygen Ventilation: Mask ventilation without difficulty Dental Injury: Teeth and Oropharynx as per pre-operative assessment

## 2014-09-09 NOTE — Anesthesia Preprocedure Evaluation (Signed)
Anesthesia Evaluation  Patient identified by MRN, date of birth, ID band Patient awake    Reviewed: Allergy & Precautions, NPO status , Patient's Chart, lab work & pertinent test results  Airway Mallampati: II  TM Distance: >3 FB Neck ROM: Full    Dental no notable dental hx.    Pulmonary sleep apnea and Continuous Positive Airway Pressure Ventilation , Current Smoker,  breath sounds clear to auscultation  Pulmonary exam normal       Cardiovascular hypertension, Pt. on medications Normal cardiovascular exam+ dysrhythmias Atrial Fibrillation Rhythm:Regular Rate:Normal     Neuro/Psych negative neurological ROS  negative psych ROS   GI/Hepatic negative GI ROS, Neg liver ROS,   Endo/Other  negative endocrine ROS  Renal/GU negative Renal ROS  negative genitourinary   Musculoskeletal negative musculoskeletal ROS (+)   Abdominal   Peds negative pediatric ROS (+)  Hematology negative hematology ROS (+)   Anesthesia Other Findings   Reproductive/Obstetrics negative OB ROS                             Anesthesia Physical Anesthesia Plan  ASA: III  Anesthesia Plan: MAC   Post-op Pain Management:    Induction: Intravenous  Airway Management Planned: Mask  Additional Equipment:   Intra-op Plan:   Post-operative Plan: Extubation in OR  Informed Consent: I have reviewed the patients History and Physical, chart, labs and discussed the procedure including the risks, benefits and alternatives for the proposed anesthesia with the patient or authorized representative who has indicated his/her understanding and acceptance.   Dental advisory given  Plan Discussed with: CRNA and Surgeon  Anesthesia Plan Comments:         Anesthesia Quick Evaluation

## 2014-09-13 ENCOUNTER — Encounter (HOSPITAL_COMMUNITY): Payer: Self-pay | Admitting: Cardiology

## 2014-09-13 ENCOUNTER — Ambulatory Visit (HOSPITAL_COMMUNITY)
Admission: RE | Admit: 2014-09-13 | Discharge: 2014-09-13 | Disposition: A | Discharge status: Home / Self Care | Payer: Federal, State, Local not specified - PPO | Source: Ambulatory Visit | Attending: Nurse Practitioner | Admitting: Nurse Practitioner

## 2014-09-13 VITALS — BP 120/78 | HR 94 | Ht 71.0 in | Wt 219.4 lb

## 2014-09-13 DIAGNOSIS — E669 Obesity, unspecified: Secondary | ICD-10-CM | POA: Diagnosis not present

## 2014-09-13 DIAGNOSIS — F17210 Nicotine dependence, cigarettes, uncomplicated: Secondary | ICD-10-CM | POA: Insufficient documentation

## 2014-09-13 DIAGNOSIS — G4733 Obstructive sleep apnea (adult) (pediatric): Secondary | ICD-10-CM | POA: Insufficient documentation

## 2014-09-13 DIAGNOSIS — I481 Persistent atrial fibrillation: Secondary | ICD-10-CM

## 2014-09-13 DIAGNOSIS — I4819 Other persistent atrial fibrillation: Secondary | ICD-10-CM

## 2014-09-13 NOTE — Progress Notes (Signed)
Patient ID: Ronald [name], male   DOB: 28-Jan-1946, 69 y.o.   MRN: [id number] Patient ID: Ronald [name], male   DOB: 06/11/1945, 69 y.o.   MRN: [id number]    Primary Care Physician: [name], MD Primary Cardiologist: [name] Primary Electrophysiologist: Referring Physician:Dr. Feliz Beam is a 69 y.o. male with a h/o persistent atrial fibrillation who presents for f/u cardioverions in [facility].  The patient was initially diagnosed with atrial fibrillation  in January when he presented for surgery for bilateral hydrocelectomy. Had v rate of 150 bpm and surgery was cancelled, he was unaware. He had a successful DCCV, started on rate control, apixaban and had ERAF. He is for the most part is asymptomatic. He is slightly aware of some vague chest tightness intermittently and some increase in shortness of breath when he is in afib, but does not feel the irregularity. He was seen by [name] in May and was started on flecainide and f/u EKG in that office continued to show afib. Today's EKG also reveals afib, rate controlled.  Review of lifestyle reveals OSA and pt is compliant with CPAP but it has been awhile since he has underwent a two week download.He is to f/u with pulmonologist for further review.  He has a long term smoking habit and has been trying to cut back. He drinks 2 alcoholic beverages a week and we discussed trying to keep to two drinks a week. Currently does not have a regular exercise program and his weight fluctuates by several pounds a day. He does not use the salt shaker but is  not careful with salty foods. BP controlled today but was showing some elevation until he started rate control meds.  Has a chadsvasc score of 2(HTN/AGE) and is being compliant with eliquis without bleeding. He is tolerating flecainide without side effects. Prior echo showed mod left atrium enlargement and normal LV function. Recent myoview  without ischemia.  He did have SR at time of DCCV, but is in afib this am. Pt is dependent of his fitbit to let him know when his HB is elevated and he has noted heart rates in the 130's a couple of times since DCCV, associated with mild chest tightness. Overall, he feels better on flecainide. QTc shows some prolongation, was in normal range in SR. Is taking allegra over the last month for sinus and review of lit shows some possible prolongation of QTc with allegra.  Today, he denies symptoms of palpitations, chest pain, shortness of breath, orthopnea, PND, lower extremity edema, dizziness, presyncope, syncope, snoring, daytime somnolence, bleeding, or neurologic sequela. The patient is tolerating medications without difficulties and is otherwise without complaint today.    Atrial Fibrillation Risk Factors:  he does have symptoms or diagnosis of sleep apnea. he is compliant with CPAP therapy.  he does not have a history of rheumatic fever.  he does have a history of alcohol use.  he has a BMI of Body mass index is 30.61 kg/(m^2).Marland Kitchen Filed Weights   09/13/14 1051  Weight: 219 lb 6.4 oz (99.519 kg)    LA size: moderate enlargement   Atrial Fibrillation Management history:  Previous antiarrhythmic drugs:flecainide  Previous cardioversions: x2 in January 2016, May 2016  Previous ablations: none  CHADS2VASC score: 2  Anticoagulation history: Eliquis   Past Medical History  Diagnosis Date  . Hypertension   . Hyperlipidemia   . Gout   . Tinea versicolor   .  OSA on CPAP     per study 08-03-2004  Severe OSA  . Allergic rhinitis   . Prostate cancer     Dx  2014--  stage T1c, Gleason 3+3=6, PSA 6.67--  Active surveillance  . Bilateral hydrocele   . History of colon polyps     tubular adenoma 2013  . Sigmoid diverticulosis   . Cigarette smoker two packs a day or less    Past Surgical History  Procedure Laterality Date  . Colonoscopy  last one 06-07-2011  . Prostate biopsy   05/15/12    Clinically both Lobes  . Tonsillectomy  as child  . Laminectomy and microdiscectomy lumbar spine  12-23-2003    Left L5 -- S1  . Laparoscopic bilateral inguinal hernia repair/  umbilical hernia repair with mesh/  aspiration left hydrocele  07-11-2012  . Transthoracic echocardiogram  05/01/2014    mild LVH; EF 50-55% (cannot measure diastolic Fxn with Afib), Mod LA Dilation, mild MR  . Tee without cardioversion N/A 05/07/2014    Procedure: TRANSESOPHAGEAL ECHOCARDIOGRAM (TEE);  Surgeon: [name], MD;  Location: [facility] ENDOSCOPY;  Service: Cardiovascular;  Laterality: N/A;  . Cardioversion N/A 05/07/2014    Procedure: CARDIOVERSION;  Surgeon: [name], MD;  Location: [place];  Service: Cardiovascular;  Laterality: N/A;  . Nm myoview ltd  05/18/2014    Low risk study. Normal perfusion: No ischemia or infarction. Mild LV dysfunction - 46% (does not correlate with echocardiographic EF of 50-55%)  . Cardioversion N/A 09/09/2014    Procedure: CARDIOVERSION;  Surgeon: Lelon Perla, MD;  Location: [facility] ENDOSCOPY;  Service: Cardiovascular;  Laterality: N/A;    Current Outpatient Prescriptions  Medication Sig Dispense Refill  . acetaminophen (TYLENOL) 500 MG tablet Take 500 mg by mouth every 6 (six) hours as needed for mild pain or moderate pain.    Marland Kitchen apixaban (ELIQUIS) 5 MG TABS tablet Take 1 tablet (5 mg total) by mouth 2 (two) times daily. 180 tablet 1  . atorvastatin (LIPITOR) 10 MG tablet Take 1 tablet (10 mg total) by mouth every evening.    . diltiazem (CARDIZEM CD) 360 MG 24 hr capsule Take 1 capsule (360 mg total) by mouth daily. 90 capsule 1  . fexofenadine (ALLEGRA) 180 MG tablet Take 180 mg by mouth daily.     . flecainide (TAMBOCOR) 100 MG tablet Take 1 tablet (100 mg total) by mouth 2 (two) times daily. 60 tablet 4  . fluticasone (FLONASE) 50 MCG/ACT nasal spray Place 2 sprays into both nostrils daily as needed for allergies or rhinitis.    . furosemide (LASIX) 40 MG  tablet TAKE 1 TABLET BY MOUTH DAILY 30 tablet 11  . lisinopril (PRINIVIL,ZESTRIL) 20 MG tablet Take 1 tablet (20 mg total) by mouth every evening.    . metoprolol tartrate (LOPRESSOR) 25 MG tablet Take 1 tablet (25 mg total) by mouth 2 (two) times daily. 60 tablet 11  . triamcinolone cream (KENALOG) 0.1 % Apply 1 application topically 2 (two) times daily as needed (for rash).     No current facility-administered medications for this encounter.    No Known Allergies  History   Social History  . Marital Status: Married    Spouse Name: N/A  . Number of Children: N/A  . Years of Education: N/A   Occupational History  . Sales    Social History Main Topics  . Smoking status: Current Every Day Smoker -- 1.00 packs/day for 55 years    Types: Cigarettes  .  Smokeless tobacco: Never Used  . Alcohol Use: 3.0 oz/week    6 Standard drinks or equivalent per week  . Drug Use: No  . Sexual Activity: Not on file   Other Topics Concern  . Not on file   Social History Narrative   Married 25 years   2 children but not with this wife    Family History  Problem Relation Age of Onset  . Stroke      Unknown   . Colon cancer Neg Hx   . Esophageal cancer Neg Hx   . Rectal cancer Neg Hx   . Stomach cancer Neg Hx   . Breast cancer Mother   . Ovarian cancer Sister    The patient does not have a history of early familial atrial fibrillation or other arrhythmias.  ROS- All systems are reviewed and negative except as per the HPI above.  Physical Exam: Filed Vitals:   09/13/14 1051  BP: 120/78  Pulse: 94  Height: 5\' 11"  (1.803 m)  Weight: 219 lb 6.4 oz (99.519 kg)    GEN- The patient is well appearing, alert and oriented x 3 today.   Head- normocephalic, atraumatic Eyes-  Sclera clear, conjunctiva pink Ears- hearing intact Oropharynx- clear Neck- supple, no JVP Lymph- no cervical lymphadenopathy Lungs- Clear to ausculation bilaterally, normal work of breathing Heart- Regular rate  and rhythm, no murmurs, rubs or gallops, PMI not laterally displaced GI- soft, NT, ND, + BS Extremities- no clubbing, cyanosis, or edema MS- no significant deformity or atrophy Skin- no rash or lesion Psych- euthymic mood, full affect Neuro- strength and sensation are intact  EKG Afib at 94  bpm, QRS 108 bpm, Qtc 520 ms Echo- Normal LV function; moderate LAE; mild RAE; mild MR. Myoview-Overall Impression: Low risk stress nuclear study Nl perfusion with mild LV dysfunction. Suggest a 2D to correlate .LV Wall Motion: Mild LV dysfunction  Epic records are reviewed  today  Assessment and Plan:  1. Atrial fibrillation  The patient has mildly symptomatic  persistent atrial fibrillation but feel that pt will be better off in the long run if SR can be achieved. So far he has failed flecainide.  The patients CHAD2VASC score is 2.  he is  appropriately anticoagulated at this time. The patient is adequately rate controlled with cardizem, metoprolol. Antiarrhythmic therapy to dates has included flecainide  The patients left atrial size is moderately enlarged  Additional echo findings include normal LV function A long discussion with the patient was had today regarding therapeutic strategies.  Extensive discussion of lifestyle modification including quit smoking, begin progressive daily aerobic exercise program, attempt to lose weight, decrease or avoid alcohol intake, reduce salt in diet and cooking, improve dietary compliance and continue current medications was also discussed.  Presently, our recommendations include :  1. Persistent afib Early return afib s/p DCCV Reminded not to miss any doses of DOAC.  Pt feels better on flecainide and is not convinced that he is not having intermittent SR. For now will not stop flecainide but will repeat EKG later in week. QTc prolonged and flecainide is not a QT prolonging drug, may be a rate issue. QT was ok in SR past dccv. He has been using a lot of OTC  allegra(possible contributing to QT issues) for sinus congestion but will hold off from using.  2. Obesity As above, lifestyle modification was discussed at length including regular exercise and weight reduction.  3. Obstructive sleep apnea The importance of adequate treatment  of sleep apnea was discussed today in order to improve our ability to maintain sinus rhythm long term.  Return Thursday for repeat EKG, or any other time this week he thinks he may be in SR. F/u with [name] as scheduled in August and Dr. Rayann Heman in July to further discuss options. He is very interested re LinQ, since he is having issues identifying when he is in afib.    [name] NP  Nurse Practitioner, [facility] 09/13/2014 12:19 PM

## 2014-09-13 NOTE — Patient Instructions (Signed)
Parking code [id number]

## 2014-09-15 ENCOUNTER — Other Ambulatory Visit: Payer: Self-pay | Admitting: Pulmonary Disease

## 2014-09-15 ENCOUNTER — Encounter: Payer: Self-pay | Admitting: Pulmonary Disease

## 2014-09-15 ENCOUNTER — Ambulatory Visit (INDEPENDENT_AMBULATORY_CARE_PROVIDER_SITE_OTHER): Payer: Federal, State, Local not specified - PPO | Admitting: Pulmonary Disease

## 2014-09-15 ENCOUNTER — Ambulatory Visit (HOSPITAL_COMMUNITY)
Admission: RE | Admit: 2014-09-15 | Discharge: 2014-09-15 | Disposition: A | Discharge status: Home / Self Care | Payer: Federal, State, Local not specified - PPO | Source: Ambulatory Visit | Attending: Nurse Practitioner | Admitting: Nurse Practitioner

## 2014-09-15 VITALS — BP 118/74 | HR 95 | Temp 97.9°F | Ht 71.0 in | Wt 221.2 lb

## 2014-09-15 DIAGNOSIS — I4891 Unspecified atrial fibrillation: Secondary | ICD-10-CM | POA: Diagnosis not present

## 2014-09-15 DIAGNOSIS — I480 Paroxysmal atrial fibrillation: Secondary | ICD-10-CM

## 2014-09-15 DIAGNOSIS — G4733 Obstructive sleep apnea (adult) (pediatric): Secondary | ICD-10-CM

## 2014-09-15 NOTE — Patient Instructions (Signed)
Continue on cpap, and we will get a download off your machine to make sure your sleep apnea is adequately controlled. followup with Dr. Halford Chessman in one year.

## 2014-09-15 NOTE — Progress Notes (Signed)
Pt came in today for repeat EKG for long QTc 520 ms,on last EKG, now improved at 488 ms. He had been taking allegra and has now stopped x 2 days. Feels great even though EKG shows Afib rate controlled. Flecainide is not maintaining SR but pt feels much better on it and he reports lower v rates on drug. He has f/u with Dr. Rayann Heman for consideration for ablation in the next few weeks.

## 2014-09-15 NOTE — Progress Notes (Signed)
Patient in for EKG today. States feels best he has felt in weeks and wanted to come get ekg to see what his heart was doing.  [name], NP to review EKG.

## 2014-09-15 NOTE — Assessment & Plan Note (Signed)
The patient continues to do very well with his C Pap device, and is satisfied with his sleep and daytime alertness. He is having issues with new onset A. fib, and is wondering if his sleep apnea is adequately controlled. I will get a download off his device, and let him know the results.

## 2014-09-15 NOTE — Progress Notes (Signed)
   Subjective:    Patient ID: Ronald [name], male    DOB: [birth year], 69 y.o.   MRN: [id number]  HPI The patient comes in today for follow-up of his obstructive sleep apnea. He is wearing C Pap compliantly, and is having no issues with his mask fit or pressure. He is satisfied with his sleep and daytime alertness.   Review of Systems  Constitutional: Negative for fever and unexpected weight change.  HENT: Negative for congestion, dental problem, ear pain, nosebleeds, postnasal drip, rhinorrhea, sinus pressure, sneezing, sore throat and trouble swallowing.   Eyes: Negative for redness and itching.  Respiratory: Negative for cough, chest tightness, shortness of breath and wheezing.   Cardiovascular: Negative for palpitations and leg swelling.  Gastrointestinal: Negative for nausea and vomiting.  Genitourinary: Negative for dysuria.  Musculoskeletal: Negative for joint swelling.  Skin: Negative for rash.  Neurological: Negative for headaches.  Hematological: Does not bruise/bleed easily.  Psychiatric/Behavioral: Negative for dysphoric mood. The patient is not nervous/anxious.        Objective:   Physical Exam Well-developed male in no acute distress Nose without purulence or discharge noted Neck without lymphadenopathy or thyromegaly No skin breakdown or pressure necrosis from the C Pap mask Lower extremities without edema, no cyanosis Alert and oriented, moves all 4 extremities.       Assessment & Plan:

## 2014-09-16 ENCOUNTER — Encounter (HOSPITAL_COMMUNITY): Payer: Federal, State, Local not specified - PPO | Admitting: Nurse Practitioner

## 2014-10-13 ENCOUNTER — Encounter: Payer: Self-pay | Admitting: Adult Health

## 2014-10-13 ENCOUNTER — Ambulatory Visit (INDEPENDENT_AMBULATORY_CARE_PROVIDER_SITE_OTHER): Payer: Federal, State, Local not specified - PPO | Admitting: Adult Health

## 2014-10-13 ENCOUNTER — Ambulatory Visit (INDEPENDENT_AMBULATORY_CARE_PROVIDER_SITE_OTHER): Payer: Federal, State, Local not specified - PPO | Admitting: Internal Medicine

## 2014-10-13 ENCOUNTER — Encounter: Payer: Self-pay | Admitting: Internal Medicine

## 2014-10-13 VITALS — BP 104/76 | Temp 98.9°F | Ht 71.0 in | Wt 224.9 lb

## 2014-10-13 VITALS — BP 124/82 | HR 92 | Ht 71.0 in | Wt 222.8 lb

## 2014-10-13 DIAGNOSIS — I481 Persistent atrial fibrillation: Secondary | ICD-10-CM

## 2014-10-13 DIAGNOSIS — T148 Other injury of unspecified body region: Secondary | ICD-10-CM | POA: Diagnosis not present

## 2014-10-13 DIAGNOSIS — W57XXXA Bitten or stung by nonvenomous insect and other nonvenomous arthropods, initial encounter: Secondary | ICD-10-CM | POA: Diagnosis not present

## 2014-10-13 DIAGNOSIS — I4819 Other persistent atrial fibrillation: Secondary | ICD-10-CM

## 2014-10-13 MED ORDER — METOPROLOL TARTRATE 50 MG PO TABS: 50.0000 mg | ORAL_TABLET | Freq: Two times a day (BID) | ORAL | Status: DC

## 2014-10-13 NOTE — Progress Notes (Signed)
Pre visit review using our clinic review tool, if applicable. No additional management support is needed unless otherwise documented below in the visit note. 

## 2014-10-13 NOTE — Patient Instructions (Signed)
Medication Instructions: 1) Stop flecainide 2) Increase metoprolol tartrate to 50 mg one tablet by mouth twice daily  Labwork: - none  Procedures/Testing: - none  Follow-Up: - Your physician recommends that you schedule a follow-up appointment in: 4 weeks with [name], NP in the [facility]  Any Additional Special Instructions Will Be Listed Below (If Applicable). - none

## 2014-10-13 NOTE — Progress Notes (Signed)
Subjective:    Patient ID: Ronald [name], male    DOB: Mar 11, 1946, 69 y.o.   MRN: [id number]  HPI  Ronald [name] presents to the office today for a tick bite to his abdomen. He endorses that he first noticed it 4 weeks ago and was able to remove the tick ( unknown how long the tick was attached). Since that time he has had itching around the site with slight inflammation.   He denies any increased fatigue, fever, rash, or body aches. Denies    Review of Systems  Constitutional: Negative.   Respiratory: Negative.   Cardiovascular: Negative.   Gastrointestinal: Negative.   Musculoskeletal: Negative.   Skin: Positive for wound. Negative for color change and rash.  Neurological: Negative.   Hematological: Negative.   Psychiatric/Behavioral: Negative.   All other systems reviewed and are negative.  Past Medical History  Diagnosis Date  . Hypertension   . Hyperlipidemia   . Gout   . Tinea versicolor   . OSA on CPAP     per study 08-03-2004  Severe OSA  . Allergic rhinitis   . Prostate cancer     Dx  2014--  stage T1c, Gleason 3+3=6, PSA 6.67--  Active surveillance  . Bilateral hydrocele   . History of colon polyps     tubular adenoma 2013  . Sigmoid diverticulosis   . Cigarette smoker two packs a day or less     History   Social History  . Marital Status: Married    Spouse Name: N/A  . Number of Children: N/A  . Years of Education: N/A   Occupational History  . Sales    Social History Main Topics  . Smoking status: Current Every Day Smoker -- 1.00 packs/day for 55 years    Types: Cigarettes  . Smokeless tobacco: Never Used  . Alcohol Use: 3.0 oz/week    6 Standard drinks or equivalent per week  . Drug Use: No  . Sexual Activity: Not on file   Other Topics Concern  . Not on file   Social History Narrative   Married 25 years   2 children but not with this wife    Past Surgical History  Procedure Laterality Date  . Colonoscopy  last one 06-07-2011    . Prostate biopsy  05/15/12    Clinically both Lobes  . Tonsillectomy  as child  . Laminectomy and microdiscectomy lumbar spine  12-23-2003    Left L5 -- S1  . Laparoscopic bilateral inguinal hernia repair/  umbilical hernia repair with mesh/  aspiration left hydrocele  07-11-2012  . Transthoracic echocardiogram  05/01/2014    mild LVH; EF 50-55% (cannot measure diastolic Fxn with Afib), Mod LA Dilation, mild MR  . Tee without cardioversion N/A 05/07/2014    Procedure: TRANSESOPHAGEAL ECHOCARDIOGRAM (TEE);  Surgeon: [name], MD;  Location: [facility] ENDOSCOPY;  Service: Cardiovascular;  Laterality: N/A;  . Cardioversion N/A 05/07/2014    Procedure: CARDIOVERSION;  Surgeon: [name], MD;  Location: [place];  Service: Cardiovascular;  Laterality: N/A;  . Nm myoview ltd  05/18/2014    Low risk study. Normal perfusion: No ischemia or infarction. Mild LV dysfunction - 46% (does not correlate with echocardiographic EF of 50-55%)  . Cardioversion N/A 09/09/2014    Procedure: CARDIOVERSION;  Surgeon: Lelon Perla, MD;  Location: [facility] ENDOSCOPY;  Service: Cardiovascular;  Laterality: N/A;    Family History  Problem Relation Age of Onset  . Stroke  Unknown   . Colon cancer Neg Hx   . Esophageal cancer Neg Hx   . Rectal cancer Neg Hx   . Stomach cancer Neg Hx   . Breast cancer Mother   . Ovarian cancer Sister     No Known Allergies  Current Outpatient Prescriptions on File Prior to Visit  Medication Sig Dispense Refill  . acetaminophen (TYLENOL) 500 MG tablet Take 500 mg by mouth every 6 (six) hours as needed for mild pain or moderate pain.    Marland Kitchen apixaban (ELIQUIS) 5 MG TABS tablet Take 1 tablet (5 mg total) by mouth 2 (two) times daily. 180 tablet 1  . atorvastatin (LIPITOR) 10 MG tablet Take 1 tablet (10 mg total) by mouth every evening.    . diltiazem (CARDIZEM CD) 360 MG 24 hr capsule Take 1 capsule (360 mg total) by mouth daily. 90 capsule 1  . fluticasone (FLONASE) 50 MCG/ACT  nasal spray Place 2 sprays into both nostrils daily as needed for allergies or rhinitis.    . furosemide (LASIX) 40 MG tablet TAKE 1 TABLET BY MOUTH DAILY 30 tablet 11  . lisinopril (PRINIVIL,ZESTRIL) 20 MG tablet Take 1 tablet (20 mg total) by mouth every evening.    . metoprolol (LOPRESSOR) 50 MG tablet Take 1 tablet (50 mg total) by mouth 2 (two) times daily. 180 tablet 3  . triamcinolone cream (KENALOG) 0.1 % Apply 1 application topically 2 (two) times daily as needed (for rash).     No current facility-administered medications on file prior to visit.    BP 104/76 mmHg  Temp(Src) 98.9 F (37.2 C) (Oral)  Ht 5\' 11"  (1.803 m)  Wt 224 lb 14.4 oz (102.014 kg)  BMI 31.38 kg/m2       Objective:   Physical Exam  Constitutional: He is oriented to person, place, and time. He appears well-developed and well-nourished. No distress.  Eyes: Right eye exhibits no discharge. Left eye exhibits no discharge.  Neck: Normal range of motion. Neck supple.  Cardiovascular: Normal rate, regular rhythm, normal heart sounds and intact distal pulses.  Exam reveals no gallop and no friction rub.   No murmur heard. Pulmonary/Chest: Effort normal and breath sounds normal. No respiratory distress. He has no wheezes. He has no rales. He exhibits no tenderness.  Musculoskeletal: Normal range of motion. He exhibits no edema or tenderness.  Lymphadenopathy:    He has no cervical adenopathy.  Neurological: He is alert and oriented to person, place, and time.  Skin: Skin is warm and dry. No rash noted. No erythema. No pallor.  Small healing wound on left lower abdomen. No signs of tick, no signs of infection. Has slight purple discoloration around bite mark.   No rash noted.   Psychiatric: He has a normal mood and affect. His behavior is normal. Judgment and thought content normal.  Nursing note and vitals reviewed.      Assessment & Plan:  1. Tick bite - Spoke with patient about treating with course of  Doxycycline as prophylactic precaution. I advised him that I did not think it needed to be done due to it being 4 weeks since the tick was removed.  He declined any ABX at this time. He will be mindful of S&S of tick borne illness.

## 2014-10-15 NOTE — Progress Notes (Signed)
Electrophysiology Office Note   Date:  10/15/2014   ID:  [name], DOB [birth year], MRN [id number]  PCP:  Ronald Post, MD   Primary Electrophysiologist: [name], MD    Chief Complaint  Patient presents with  . Atrial Fibrillation  . Atrial Flutter     History of Present Illness: Ronald [name] is a 69 y.o. male who presents today for electrophysiology evaluation.     The patient was initially diagnosed with atrial fibrillation in January when he presented for surgery for bilateral hydrocelectomy. Had v rate of 150 bpm and surgery was cancelled, he was unaware. He had a successful DCCV, started on rate control, apixaban and had ERAF. He is for the most part is asymptomatic. He is slightly aware of some vague chest tightness intermittently and some increase in shortness of breath when he is in afib, but does not feel the irregularity. He was seen by [name] in May and was started on flecainide and f/u EKG in that office continued to show afib. Today's EKG also reveals afib, rate controlled.  Review of lifestyle reveals OSA and pt is compliant with CPAP but it has been awhile since he has underwent a two week download.He is to f/u with pulmonologist for further review. He has a long term smoking habit and has been trying to cut back. He drinks 2 alcoholic beverages a week and we discussed trying to keep to two drinks a week. Currently does not have a regular exercise program and his weight fluctuates by several pounds a day. He does not use the salt shaker but is not careful with salty foods. BP controlled today but was showing some elevation until he started rate control meds.  Has a chadsvasc score of 2(HTN/AGE) and is being compliant with eliquis without bleeding. He is tolerating flecainide without side effects. Prior echo showed mod left atrium enlargement and normal LV function. Recent myoview without ischemia.  He did have SR at time of DCCV, but is in afib  this am. Pt is dependent of his fitbit to let him know when his HB is elevated and he has noted heart rates in the 130's a couple of times since DCCV, associated with mild chest tightness. Overall, he feels better on flecainide. QTc shows some prolongation, was in normal range in SR. Is taking allegra over the last month for sinus and review of lit shows some possible prolongation of QTc with allegra.  He has done much better with rate control.  He has only occasional SOB with moderate exertion.  Today, he denies symptoms of palpitations, chest pain orthopnea, PND, lower extremity edema, claudication, dizziness, presyncope, syncope, bleeding, or neurologic sequela. The patient is tolerating medications without difficulties and is otherwise without complaint today.    Past Medical History  Diagnosis Date  . Hypertension   . Hyperlipidemia   . Gout   . Tinea versicolor   . OSA on CPAP     per study 08-03-2004  Severe OSA  . Allergic rhinitis   . Prostate cancer     Dx  2014--  stage T1c, Gleason 3+3=6, PSA 6.67--  Active surveillance  . Bilateral hydrocele   . History of colon polyps     tubular adenoma 2013  . Sigmoid diverticulosis   . Cigarette smoker two packs a day or less    Past Surgical History  Procedure Laterality Date  . Colonoscopy  last one 06-07-2011  . Prostate biopsy  05/15/12  Clinically both Lobes  . Tonsillectomy  as child  . Laminectomy and microdiscectomy lumbar spine  12-23-2003    Left L5 -- S1  . Laparoscopic bilateral inguinal hernia repair/  umbilical hernia repair with mesh/  aspiration left hydrocele  07-11-2012  . Transthoracic echocardiogram  05/01/2014    mild LVH; EF 50-55% (cannot measure diastolic Fxn with Afib), Mod LA Dilation, mild MR  . Tee without cardioversion N/A 05/07/2014    Procedure: TRANSESOPHAGEAL ECHOCARDIOGRAM (TEE);  Surgeon: [name], MD;  Location: [facility] ENDOSCOPY;  Service: Cardiovascular;  Laterality: N/A;  . Cardioversion N/A  05/07/2014    Procedure: CARDIOVERSION;  Surgeon: [name], MD;  Location: [place];  Service: Cardiovascular;  Laterality: N/A;  . Nm myoview ltd  05/18/2014    Low risk study. Normal perfusion: No ischemia or infarction. Mild LV dysfunction - 46% (does not correlate with echocardiographic EF of 50-55%)  . Cardioversion N/A 09/09/2014    Procedure: CARDIOVERSION;  Surgeon: Lelon Perla, MD;  Location: [facility] ENDOSCOPY;  Service: Cardiovascular;  Laterality: N/A;     Current Outpatient Prescriptions  Medication Sig Dispense Refill  . acetaminophen (TYLENOL) 500 MG tablet Take 500 mg by mouth every 6 (six) hours as needed for mild pain or moderate pain.    Marland Kitchen apixaban (ELIQUIS) 5 MG TABS tablet Take 1 tablet (5 mg total) by mouth 2 (two) times daily. 180 tablet 1  . atorvastatin (LIPITOR) 10 MG tablet Take 1 tablet (10 mg total) by mouth every evening.    . diltiazem (CARDIZEM CD) 360 MG 24 hr capsule Take 1 capsule (360 mg total) by mouth daily. 90 capsule 1  . fluticasone (FLONASE) 50 MCG/ACT nasal spray Place 2 sprays into both nostrils daily as needed for allergies or rhinitis.    . furosemide (LASIX) 40 MG tablet TAKE 1 TABLET BY MOUTH DAILY 30 tablet 11  . lisinopril (PRINIVIL,ZESTRIL) 20 MG tablet Take 1 tablet (20 mg total) by mouth every evening.    . metoprolol (LOPRESSOR) 50 MG tablet Take 1 tablet (50 mg total) by mouth 2 (two) times daily. 180 tablet 3  . triamcinolone cream (KENALOG) 0.1 % Apply 1 application topically 2 (two) times daily as needed (for rash).     No current facility-administered medications for this visit.    Allergies:   Review of patient's allergies indicates no known allergies.   Social History:  The patient  reports that he has been smoking Cigarettes.  He has a 55 pack-year smoking history. He has never used smokeless tobacco. He reports that he drinks about 3.0 oz of alcohol per week. He reports that he does not use illicit drugs.   Family History:   The patient's  family history includes Breast cancer in his mother; Ovarian cancer in his sister; Stroke in an other family member. There is no history of Colon cancer, Esophageal cancer, Rectal cancer, or Stomach cancer.    ROS:  Please see the history of present illness.   All other systems are reviewed and negative.    PHYSICAL EXAM: VS:  BP 124/82 mmHg  Pulse 92  Ht 5\' 11"  (1.803 m)  Wt 101.061 kg (222 lb 12.8 oz)  BMI 31.09 kg/m2 , BMI Body mass index is 31.09 kg/(m^2). GEN: Well nourished, well developed, in no acute distress HEENT: normal Neck: no JVD, carotid bruits, or masses Cardiac: iRRR; no murmurs, rubs, or gallops,no edema  Respiratory:  clear to auscultation bilaterally, normal work of breathing GI: soft,  nontender, nondistended, + BS MS: no deformity or atrophy Skin: warm and dry  Neuro:  Strength and sensation are intact Psych: euthymic mood, full affect  EKG:  EKG is ordered today. The ekg ordered today shows rate controlled afib   Recent Labs: 05/04/2014: B Natriuretic Peptide 83.0; Magnesium 1.8; TSH 1.343 05/05/2014: ALT 22 09/07/2014: BUN 15; Creatinine, Ser 1.02; Hemoglobin 16.4; Platelets 175; Potassium 3.8; Sodium 139    Lipid Panel     Component Value Date/Time   CHOL 146 04/17/2013 1017   TRIG 83.0 04/17/2013 1017   HDL 45.80 04/17/2013 1017   CHOLHDL 3 04/17/2013 1017   VLDL 16.6 04/17/2013 1017   LDLCALC 84 04/17/2013 1017   LDLDIRECT 57.8 03/10/2012 0834     Wt Readings from Last 3 Encounters:  10/13/14 102.014 kg (224 lb 14.4 oz)  10/13/14 101.061 kg (222 lb 12.8 oz)  09/15/14 100.336 kg (221 lb 3.2 oz)      Other studies Reviewed: Additional studies/ records that were reviewed today include: AF clinic notes    ASSESSMENT AND PLAN:  1.  Persistent afib Seems to be presently doing very well with a rate control strategy Will therefore stop flecainide Increase metoprolol  Follow-up in the AF clinic in 4 weeks and then every 3  months thereafter.  i will see when needed going forward  Current medicines are reviewed at length with the patient today.   The patient does not have concerns regarding his medicines.  The following changes were made today:  none  Labs/ tests ordered today include:  Orders Placed This Encounter  Procedures  . EKG 12-Lead     Signed, [name], MD  10/15/2014 8:26 AM     [facility] [address] [place]  [place] [address] [phone] (office) [phone] (fax)

## 2014-10-21 ENCOUNTER — Telehealth (HOSPITAL_COMMUNITY): Payer: Self-pay | Admitting: *Deleted

## 2014-10-21 MED ORDER — METOPROLOL TARTRATE 50 MG PO TABS: 75.0000 mg | ORAL_TABLET | Freq: Two times a day (BID) | ORAL | Status: DC

## 2014-10-21 NOTE — Telephone Encounter (Signed)
lmom for Ronald [name] to call me back at [facility].  Discussed with Ronald Palau, NP, we can increase his Metoprolol to 75 mg twice daily and if no better next week will move his appontment up

## 2014-10-21 NOTE — Telephone Encounter (Signed)
Spoke with wife and she is going to have him ncrease his Metoprolol to 75 mg twice daily and will call if not any better to move his appointment up

## 2014-10-25 DIAGNOSIS — H1132 Conjunctival hemorrhage, left eye: Secondary | ICD-10-CM | POA: Diagnosis not present

## 2014-10-28 ENCOUNTER — Encounter (HOSPITAL_COMMUNITY): Payer: Self-pay | Admitting: Nurse Practitioner

## 2014-10-28 ENCOUNTER — Other Ambulatory Visit (HOSPITAL_COMMUNITY): Payer: Self-pay | Admitting: Respiratory Therapy

## 2014-10-28 ENCOUNTER — Ambulatory Visit (HOSPITAL_COMMUNITY)
Admission: RE | Admit: 2014-10-28 | Discharge: 2014-10-28 | Disposition: A | Discharge status: Home / Self Care | Payer: Federal, State, Local not specified - PPO | Source: Ambulatory Visit | Attending: Nurse Practitioner | Admitting: Nurse Practitioner

## 2014-10-28 VITALS — BP 120/88 | HR 75 | Ht 71.0 in | Wt 221.2 lb

## 2014-10-28 DIAGNOSIS — I4819 Other persistent atrial fibrillation: Secondary | ICD-10-CM

## 2014-10-28 DIAGNOSIS — I481 Persistent atrial fibrillation: Secondary | ICD-10-CM

## 2014-10-28 LAB — COMPREHENSIVE METABOLIC PANEL
ALT: 25 U/L (ref 17–63)
AST: 19 U/L (ref 15–41)
Albumin: 3.8 g/dL (ref 3.5–5.0)
Alkaline Phosphatase: 81 U/L (ref 38–126)
Anion gap: 8 (ref 5–15)
BUN: 13 mg/dL (ref 6–20)
CHLORIDE: 100 mmol/L — AB (ref 101–111)
CO2: 31 mmol/L (ref 22–32)
Calcium: 9.1 mg/dL (ref 8.9–10.3)
Creatinine, Ser: 1.11 mg/dL (ref 0.61–1.24)
GFR calc Af Amer: >60 mL/min (ref >60)
GFR calc non Af Amer: >60 mL/min (ref >60)
Glucose, Bld: 158 mg/dL — ABNORMAL HIGH (ref 65–99)
Potassium: 4.1 mmol/L (ref 3.5–5.1)
SODIUM: 139 mmol/L (ref 135–145)
Total Bilirubin: 0.8 mg/dL (ref 0.3–1.2)
Total Protein: 6.5 g/dL (ref 6.5–8.1)

## 2014-10-28 LAB — TSH: TSH: 1.464 u[IU]/mL (ref 0.350–4.500)

## 2014-10-28 LAB — MAGNESIUM: MAGNESIUM: 1.8 mg/dL (ref 1.7–2.4)

## 2014-10-28 MED ORDER — AMIODARONE HCL 200 MG PO TABS: 200.0000 mg | ORAL_TABLET | Freq: Two times a day (BID) | ORAL | Status: DC

## 2014-10-28 NOTE — Patient Instructions (Addendum)
Your physician has recommended you make the following change in your medication:  1) Amiodarone 200mg  twice a day  Your physician has recommended that you have a pulmonary function test. Pulmonary Function Tests are a group of tests that measure how well air moves in and out of your lungs. Do not use caffeine, inhalers, or smoking for 4 hours prior to procedure.  Go to admitting at 9:15. Main entrance of [place].  Parking code for august 0008  Continue to monitor HR and if HR is below 60 a lot with adding amiodarone let us know and we will adjust the metoprolol as needed.

## 2014-10-28 NOTE — Progress Notes (Signed)
Patient ID: Ronald [name], male   DOB: 21-Sep-1945, 69 y.o.   MRN: [id number]         Date:  10/28/2014   ID:  Ronald [name], DOB 1946-03-30, MRN [id number]  PCP:  [name], MD   Primary Electrophysiologist: [name], MD    Chief Complaint  Patient presents with  . Atrial Fibrillation     History of Present Illness: Ronald [name] is a 69 y.o. male who presents today for  F/u in the afib clinic.    The patient was initially diagnosed with atrial fibrillation in January when he presented for surgery for bilateral hydrocelectomy. Had v rate of 150 bpm and surgery was cancelled, he was unaware. He had a successful DCCV, started on rate control, apixaban and had ERAF. He was for the most part  asymptomatic. He is slightly aware of some vague chest tightness intermittently and some increase in shortness of breath when he is in afib, but does not feel the irregularity. He was seen by [name] in May and was started on flecainide and f/u EKG in that office continued to show afib. Today's EKG also reveals afib, rate controlled.  Review of lifestyle reveals OSA and pt is compliant with CPAP.Marland Kitchen He has a long term smoking habit and has been trying to cut back. He drinks alcohol and we discussed trying to keep to two drinks a week. Currently does not have a regular exercise program and his weight fluctuates by several pounds a day. He does not use the salt shaker but is not careful with salty foods. BP recently has shown some mild elevation.Has a chadsvasc score of 2(HTN/AGE) and is being compliant with eliquis without bleeding. Prior echo showed mod left atrium enlargement and normal LV function. Recent myoview without ischemia.  He was on flecainide but was staying in afib, saw Dr. Rayann Heman and  thought to be only mildly symptomatic so plan was to rate control. He was given option of amiodarone vrs tikosyn, but he wanted to try rate control first. He is saying today that he just  does not feel as well and would like to try a rhythm control strategy. He is not interested in tikosyn due to hospital stay and cost and would like to try amiodarone.   Today, he denies symptoms of palpitations, chest pain orthopnea, PND, lower extremity edema, claudication, dizziness, presyncope, syncope, bleeding, or neurologic sequela. Positive  for some fatigue and mild shortness of breth. The patient is tolerating medications without difficulties and is otherwise without complaint today.    Past Medical History  Diagnosis Date  . Hypertension   . Hyperlipidemia   . Gout   . Tinea versicolor   . OSA on CPAP     per study 08-03-2004  Severe OSA  . Allergic rhinitis   . Prostate cancer     Dx  2014--  stage T1c, Gleason 3+3=6, PSA 6.67--  Active surveillance  . Bilateral hydrocele   . History of colon polyps     tubular adenoma 2013  . Sigmoid diverticulosis   . Cigarette smoker two packs a day or less    Past Surgical History  Procedure Laterality Date  . Colonoscopy  last one 06-07-2011  . Prostate biopsy  05/15/12    Clinically both Lobes  . Tonsillectomy  as child  . Laminectomy and microdiscectomy lumbar spine  12-23-2003    Left L5 -- S1  . Laparoscopic bilateral inguinal hernia repair/  umbilical hernia repair  with mesh/  aspiration left hydrocele  07-11-2012  . Transthoracic echocardiogram  05/01/2014    mild LVH; EF 50-55% (cannot measure diastolic Fxn with Afib), Mod LA Dilation, mild MR  . Tee without cardioversion N/A 05/07/2014    Procedure: TRANSESOPHAGEAL ECHOCARDIOGRAM (TEE);  Surgeon: [name], MD;  Location: [facility] ENDOSCOPY;  Service: Cardiovascular;  Laterality: N/A;  . Cardioversion N/A 05/07/2014    Procedure: CARDIOVERSION;  Surgeon: [name], MD;  Location: [place];  Service: Cardiovascular;  Laterality: N/A;  . Nm myoview ltd  05/18/2014    Low risk study. Normal perfusion: No ischemia or infarction. Mild LV dysfunction - 46% (does not correlate  with echocardiographic EF of 50-55%)  . Cardioversion N/A 09/09/2014    Procedure: CARDIOVERSION;  Surgeon: Lelon Perla, MD;  Location: [facility] ENDOSCOPY;  Service: Cardiovascular;  Laterality: N/A;     Current Outpatient Prescriptions  Medication Sig Dispense Refill  . acetaminophen (TYLENOL) 500 MG tablet Take 500 mg by mouth every 6 (six) hours as needed for mild pain or moderate pain.    Marland Kitchen apixaban (ELIQUIS) 5 MG TABS tablet Take 1 tablet (5 mg total) by mouth 2 (two) times daily. 180 tablet 1  . atorvastatin (LIPITOR) 10 MG tablet Take 1 tablet (10 mg total) by mouth every evening.    . diltiazem (CARDIZEM CD) 360 MG 24 hr capsule Take 1 capsule (360 mg total) by mouth daily. 90 capsule 1  . fluticasone (FLONASE) 50 MCG/ACT nasal spray Place 2 sprays into both nostrils daily as needed for allergies or rhinitis.    . furosemide (LASIX) 40 MG tablet TAKE 1 TABLET BY MOUTH DAILY 30 tablet 11  . lisinopril (PRINIVIL,ZESTRIL) 20 MG tablet Take 1 tablet (20 mg total) by mouth every evening.    . metoprolol (LOPRESSOR) 50 MG tablet Take 1.5 tablets (75 mg total) by mouth 2 (two) times daily. 270 tablet 3  . triamcinolone cream (KENALOG) 0.1 % Apply 1 application topically 2 (two) times daily as needed (for rash).    Marland Kitchen amiodarone (PACERONE) 200 MG tablet Take 1 tablet (200 mg total) by mouth 2 (two) times daily. 60 tablet 2   No current facility-administered medications for this encounter.    Allergies:   Review of patient's allergies indicates no known allergies.   Social History:  The patient  reports that he has been smoking Cigarettes.  He has a 55 pack-year smoking history. He has never used smokeless tobacco. He reports that he drinks about 3.0 oz of alcohol per week. He reports that he does not use illicit drugs.   Family History:  The patient's  family history includes Breast cancer in his mother; Ovarian cancer in his sister; Stroke in an other family member. There is no history of  Colon cancer, Esophageal cancer, Rectal cancer, or Stomach cancer.    ROS:  Please see the history of present illness.   All other systems are reviewed and negative.    PHYSICAL EXAM: VS:  BP 120/88 mmHg  Pulse 75  Ht 5\' 11"  (1.803 m)  Wt 221 lb 3.2 oz (100.336 kg)  BMI 30.86 kg/m2 , BMI Body mass index is 30.86 kg/(m^2). GEN: Well nourished, well developed, in no acute distress HEENT: normal Neck: no JVD, carotid bruits, or masses Cardiac: iRRR; no murmurs, rubs, or gallops,no edema  Respiratory:  clear to auscultation bilaterally, normal work of breathing GI: soft, nontender, nondistended, + BS MS: no deformity or atrophy Skin: warm and dry  Neuro:  Strength and sensation are intact Psych: euthymic mood, full affect  EKG:  EKG is ordered today. The ekg ordered today shows rate controlled afib at 75 bpm.   Recent Labs: 05/04/2014: B Natriuretic Peptide 83.0 09/07/2014: Hemoglobin 16.4; Platelets 175 10/28/2014: ALT 25; BUN 13; Creatinine, Ser 1.11; Magnesium 1.8; Potassium 4.1; Sodium 139; TSH 1.464    Lipid Panel     Component Value Date/Time   CHOL 146 04/17/2013 1017   TRIG 83.0 04/17/2013 1017   HDL 45.80 04/17/2013 1017   CHOLHDL 3 04/17/2013 1017   VLDL 16.6 04/17/2013 1017   LDLCALC 84 04/17/2013 1017   LDLDIRECT 57.8 03/10/2012 0834     Wt Readings from Last 3 Encounters:  10/28/14 221 lb 3.2 oz (100.336 kg)  10/13/14 224 lb 14.4 oz (102.014 kg)  10/13/14 222 lb 12.8 oz (101.061 kg)      Epic records reviewed.   ASSESSMENT AND PLAN:  1.  Persistent afib Does not feel as well in rate control afib and would like to try rhythm control, more specifically amiodarone. I discussed risk vrs benefit of afib with possible thyroid, eye, liver, lung issues and pt would like to proceed.   Will schedule PFT's and get baseline tsh and liver today Amiodarone 200 mg bid for load x one month. Will leave metoprolol and cardizem the same doses, but pt is to  watch HR  very carefully with his Fitbit and if notices significant slowing of heart beat will call office for further review.  Follow-up in the AF clinic in 4 weeks and if drug has not converted to [place] will set up for DCCV.    Labs/ tests ordered today include:  Orders Placed This Encounter  Procedures  . TSH  . Magnesium  . Comprehensive metabolic panel  . Pulmonary Function Test  . EKG 12-Lead  . Pulmonary function test     Eduard Roux, NP  10/28/2014 3:06 PM

## 2014-11-01 ENCOUNTER — Ambulatory Visit (HOSPITAL_COMMUNITY)
Admission: RE | Admit: 2014-11-01 | Discharge: 2014-11-01 | Disposition: A | Discharge status: Home / Self Care | Payer: Federal, State, Local not specified - PPO | Source: Ambulatory Visit | Attending: Nurse Practitioner | Admitting: Nurse Practitioner

## 2014-11-01 DIAGNOSIS — I481 Persistent atrial fibrillation: Secondary | ICD-10-CM | POA: Diagnosis not present

## 2014-11-01 DIAGNOSIS — I4819 Other persistent atrial fibrillation: Secondary | ICD-10-CM

## 2014-11-01 LAB — PULMONARY FUNCTION TEST
DL/VA % PRED: 64 %
DL/VA: 2.97 ml/min/mmHg/L
DLCO UNC: 17.89 ml/min/mmHg
DLCO unc % pred: 55 %
FEF 25-75 PRE: 2.65 L/s
FEF 25-75 Post: 3.55 L/sec
FEF2575-%CHANGE-POST: 33 %
FEF2575-%Pred-Post: 141 %
FEF2575-%Pred-Pre: 106 %
FEV1-%Change-Post: 11 %
FEV1-%PRED-POST: 104 %
FEV1-%Pred-Pre: 93 %
FEV1-PRE: 3.07 L
FEV1-Post: 3.42 L
FEV1FVC-%CHANGE-POST: 8 %
FEV1FVC-%PRED-PRE: 100 %
FEV6-%Change-Post: 2 %
FEV6-%Pred-Post: 99 %
FEV6-%Pred-Pre: 96 %
FEV6-Post: 4.19 L
FEV6-Pre: 4.07 L
FEV6FVC-%Change-Post: 0 %
FEV6FVC-%Pred-Post: 105 %
FEV6FVC-%Pred-Pre: 104 %
FVC-%Change-Post: 2 %
FVC-%PRED-POST: 95 %
FVC-%Pred-Pre: 92 %
FVC-Post: 4.24 L
FVC-Pre: 4.13 L
PRE FEV6/FVC RATIO: 99 %
Post FEV1/FVC ratio: 81 %
Post FEV6/FVC ratio: 99 %
Pre FEV1/FVC ratio: 74 %
RV % pred: 96 %
RV: 2.35 L
TLC % pred: 99 %
TLC: 7 L

## 2014-11-01 MED ORDER — ALBUTEROL SULFATE (2.5 MG/3ML) 0.083% IN NEBU
2.5000 mg | INHALATION_SOLUTION | Freq: Once | RESPIRATORY_TRACT | Status: AC
  Administered 2014-11-01: 2.5 mg via RESPIRATORY_TRACT

## 2014-11-04 ENCOUNTER — Telehealth (HOSPITAL_COMMUNITY): Payer: Self-pay | Admitting: Nurse Practitioner

## 2014-11-04 NOTE — Telephone Encounter (Signed)
Baseline PFT's abnormal(long time smoker) and pt has just been started on amiodarone load. I let pt know that I sent a staff message to Dr. Halford Chessman and he [facility] for amiodarone load to continue and he would monitor in their office.

## 2014-11-10 ENCOUNTER — Other Ambulatory Visit: Payer: Self-pay | Admitting: Family Medicine

## 2014-11-10 NOTE — Telephone Encounter (Signed)
Not currently on list.  08/16/14 medication was d/c by provider.

## 2014-11-10 NOTE — Telephone Encounter (Signed)
Please clarify.  He should not be on Amlodipine. He is on Diltiazem per list.

## 2014-11-14 ENCOUNTER — Other Ambulatory Visit: Payer: Self-pay | Admitting: Family Medicine

## 2014-11-15 ENCOUNTER — Ambulatory Visit (HOSPITAL_COMMUNITY): Payer: Federal, State, Local not specified - PPO | Admitting: Nurse Practitioner

## 2014-11-17 ENCOUNTER — Other Ambulatory Visit: Payer: Self-pay | Admitting: Family Medicine

## 2014-11-18 ENCOUNTER — Other Ambulatory Visit: Payer: Self-pay | Admitting: Family Medicine

## 2014-11-27 ENCOUNTER — Telehealth: Payer: Self-pay | Admitting: Physician Assistant

## 2014-11-27 NOTE — Telephone Encounter (Signed)
Patient contacted the after hour cardiology service complain of slow HR, per wife, she was instructed to contact cardiology for HR <60s. Review of his chart shows he has h/o atrial fibrillation controlled in diltiazem 360mg  daily, 75mg  BID of metoprolol and amiodarone 200mg  BID. Amiodarone was started 1 month ago with plan for 1 month of loading.   Per wife, his HR is usually in 70-80s, however for the past 3 days his resting HR has been around 66, with occasionally go down to 50s at rest and high 40s during sleep. I talked with the patient, he denies any dizziness, presyncope. He states he did not feel great, nor did he feel bad. I told him since his next followup is on Monday, i'm fine with him keeping current medication as this will likely be adjusted around the time of his DCCV.   Per A-fib clinic note, I would expect his amiodarone be cut back to 200mg  daily and possibly decrease the dose on diltiazem once he get cardioverted. Right now, he is asymptomatic on current dose.  Ronald Corrigan PA Pager: [phone]

## 2014-11-29 ENCOUNTER — Encounter (HOSPITAL_COMMUNITY): Payer: Self-pay | Admitting: Nurse Practitioner

## 2014-11-29 ENCOUNTER — Ambulatory Visit (HOSPITAL_COMMUNITY)
Admission: RE | Admit: 2014-11-29 | Discharge: 2014-11-29 | Disposition: A | Discharge status: Home / Self Care | Payer: Federal, State, Local not specified - PPO | Source: Ambulatory Visit | Attending: Nurse Practitioner | Admitting: Nurse Practitioner

## 2014-11-29 VITALS — BP 124/80 | HR 55 | Ht 71.0 in | Wt 222.4 lb

## 2014-11-29 DIAGNOSIS — Z79899 Other long term (current) drug therapy: Secondary | ICD-10-CM | POA: Insufficient documentation

## 2014-11-29 DIAGNOSIS — I10 Essential (primary) hypertension: Secondary | ICD-10-CM | POA: Diagnosis not present

## 2014-11-29 DIAGNOSIS — G4733 Obstructive sleep apnea (adult) (pediatric): Secondary | ICD-10-CM | POA: Diagnosis not present

## 2014-11-29 DIAGNOSIS — I481 Persistent atrial fibrillation: Secondary | ICD-10-CM | POA: Diagnosis not present

## 2014-11-29 DIAGNOSIS — E785 Hyperlipidemia, unspecified: Secondary | ICD-10-CM | POA: Diagnosis not present

## 2014-11-29 DIAGNOSIS — Z7902 Long term (current) use of antithrombotics/antiplatelets: Secondary | ICD-10-CM | POA: Diagnosis not present

## 2014-11-29 DIAGNOSIS — Z8546 Personal history of malignant neoplasm of prostate: Secondary | ICD-10-CM | POA: Insufficient documentation

## 2014-11-29 DIAGNOSIS — F17210 Nicotine dependence, cigarettes, uncomplicated: Secondary | ICD-10-CM | POA: Diagnosis not present

## 2014-11-29 DIAGNOSIS — I4819 Other persistent atrial fibrillation: Secondary | ICD-10-CM

## 2014-11-29 MED ORDER — AMIODARONE HCL 200 MG PO TABS: 200.0000 mg | ORAL_TABLET | Freq: Every day | ORAL | Status: DC

## 2014-11-29 NOTE — Patient Instructions (Signed)
Your physician has recommended you make the following change in your medication:   1) Take Amiodarone (Pacerone) 1 tablet (200 mg) once daily. 2) Follow up in one Month.

## 2014-11-29 NOTE — Progress Notes (Signed)
Patient ID: Ronald [name], male   DOB: [birth year], 69 y.o.   MRN: [id number]         Date:  11/29/2014   ID:  Ronald [name], DOB [birth year], MRN [id number]  PCP:  [name], MD   Primary Electrophysiologist: [name], MD    Chief Complaint  Patient presents with  . Atrial Fibrillation     History of Present Illness: Ronald [name] is a 69 y.o. male who presents today for  F/u in the afib clinic.    The patient was initially diagnosed with atrial fibrillation in January when he presented for surgery for bilateral hydrocelectomy. Had v rate of 150 bpm and surgery was cancelled, he was unaware. He had a successful DCCV, started on rate control, apixaban and had ERAF. He was for the most part  asymptomatic. He is slightly aware of some vague chest tightness intermittently and some increase in shortness of breath when he is in afib, but does not feel the irregularity. He was seen by [name] in May and was started on flecainide and f/u EKG in that office continued to show afib. Today's EKG also reveals afib, rate controlled.  Review of lifestyle reveals OSA and pt is compliant with CPAP. He has a long term smoking habit and has been trying to cut back. He drinks alcohol and we discussed trying to keep to two drinks a week. Currently does not have a regular exercise program and his weight fluctuates by several pounds a day. He does not use the salt shaker but is not careful with salty foods. BP recently has shown some mild elevation.Has a chadsvasc score of 2(HTN/AGE) and is being compliant with eliquis without bleeding. Prior echo showed mod left atrium enlargement and normal LV function. Recent myoview without ischemia.  He was on flecainide but was staying in afib, saw Dr. Rayann Heman and  thought to be only mildly symptomatic so plan was to rate control. He was given option of amiodarone vrs tikosyn, but he wanted to try rate control first. He is saying today that he just  does not feel as well and would like to try a rhythm control strategy. He is not interested in tikosyn due to hospital stay and cost and would like to try amiodarone.   He has been loading on amiodarone 200 mg bid x one month. Baseline PFT's abnormal  but conferred with pulmonologist and was given OK to continue load . He is to  f/u 6 weeks after load. He returns today in [place], feels like he became regular a week ago. He does feel better in sinus rhythm.  Today, he denies symptoms of palpitations, chest pain orthopnea, PND, lower extremity edema, claudication, dizziness, presyncope, syncope, bleeding, or neurologic sequela. Positive  for some fatigue and mild shortness of breth. The patient is tolerating medications without difficulties and is otherwise without complaint today.    Past Medical History  Diagnosis Date  . Hypertension   . Hyperlipidemia   . Gout   . Tinea versicolor   . OSA on CPAP     per study 08-03-2004  Severe OSA  . Allergic rhinitis   . Prostate cancer     Dx  2014--  stage T1c, Gleason 3+3=6, PSA 6.67--  Active surveillance  . Bilateral hydrocele   . History of colon polyps     tubular adenoma 2013  . Sigmoid diverticulosis   . Cigarette smoker two packs a day or less    Past  Surgical History  Procedure Laterality Date  . Colonoscopy  last one 06-07-2011  . Prostate biopsy  05/15/12    Clinically both Lobes  . Tonsillectomy  as child  . Laminectomy and microdiscectomy lumbar spine  12-23-2003    Left L5 -- S1  . Laparoscopic bilateral inguinal hernia repair/  umbilical hernia repair with mesh/  aspiration left hydrocele  07-11-2012  . Transthoracic echocardiogram  05/01/2014    mild LVH; EF 50-55% (cannot measure diastolic Fxn with Afib), Mod LA Dilation, mild MR  . Tee without cardioversion N/A 05/07/2014    Procedure: TRANSESOPHAGEAL ECHOCARDIOGRAM (TEE);  Surgeon: [name], MD;  Location: [facility] ENDOSCOPY;  Service: Cardiovascular;  Laterality: N/A;  .  Cardioversion N/A 05/07/2014    Procedure: CARDIOVERSION;  Surgeon: [name], MD;  Location: [place];  Service: Cardiovascular;  Laterality: N/A;  . Nm myoview ltd  05/18/2014    Low risk study. Normal perfusion: No ischemia or infarction. Mild LV dysfunction - 46% (does not correlate with echocardiographic EF of 50-55%)  . Cardioversion N/A 09/09/2014    Procedure: CARDIOVERSION;  Surgeon: Lelon Perla, MD;  Location: [facility] ENDOSCOPY;  Service: Cardiovascular;  Laterality: N/A;     Current Outpatient Prescriptions  Medication Sig Dispense Refill  . acetaminophen (TYLENOL) 500 MG tablet Take 500 mg by mouth every 6 (six) hours as needed for mild pain or moderate pain.    Marland Kitchen atorvastatin (LIPITOR) 10 MG tablet Take 1 tablet (10 mg total) by mouth every evening.    . diltiazem (CARDIZEM CD) 360 MG 24 hr capsule Take 1 capsule (360 mg total) by mouth daily. 90 capsule 1  . ELIQUIS 5 MG TABS tablet TAKE 1 TABLET TWICE A DAY 180 tablet 1  . fluticasone (FLONASE) 50 MCG/ACT nasal spray Place 2 sprays into both nostrils daily as needed for allergies or rhinitis.    . furosemide (LASIX) 40 MG tablet TAKE 1 TABLET BY MOUTH DAILY 30 tablet 11  . lisinopril (PRINIVIL,ZESTRIL) 20 MG tablet Take 1 tablet (20 mg total) by mouth every evening.    . metoprolol (LOPRESSOR) 50 MG tablet Take 1.5 tablets (75 mg total) by mouth 2 (two) times daily. 270 tablet 3  . triamcinolone cream (KENALOG) 0.1 % Apply 1 application topically 2 (two) times daily as needed (for rash).    Marland Kitchen amiodarone (PACERONE) 200 MG tablet Take 1 tablet (200 mg total) by mouth daily. 30 tablet 6  . lisinopril (PRINIVIL,ZESTRIL) 20 MG tablet TAKE 1 TABLET (20 MG TOTAL) BY MOUTH DAILY. 90 tablet 1   No current facility-administered medications for this encounter.    Allergies:   Review of patient's allergies indicates no known allergies.   Social History:  The patient  reports that he has been smoking Cigarettes.  He has a 55  pack-year smoking history. He has never used smokeless tobacco. He reports that he drinks about 3.0 oz of alcohol per week. He reports that he does not use illicit drugs.   Family History:  The patient's  family history includes Breast cancer in his mother; Ovarian cancer in his sister; Stroke in an other family member. There is no history of Colon cancer, Esophageal cancer, Rectal cancer, or Stomach cancer.    ROS:  Please see the history of present illness.   All other systems are reviewed and negative.    PHYSICAL EXAM: VS:  BP 124/80 mmHg  Pulse 55  Ht 5\' 11"  (1.803 m)  Wt 222 lb 6.4 oz (  100.88 kg)  BMI 31.03 kg/m2 , BMI Body mass index is 31.03 kg/(m^2). GEN: Well nourished, well developed, in no acute distress HEENT: normal Neck: no JVD, carotid bruits, or masses Cardiac: RRR; no murmurs, rubs, or gallops,no edema  Respiratory:  clear to auscultation bilaterally, normal work of breathing GI: soft, nontender, nondistended, + BS MS: no deformity or atrophy Skin: warm and dry  Neuro:  Strength and sensation are intact Psych: euthymic mood, full affect  EKG:  EKG is ordered today. The ekg ordered today shows Sinus brady at 55 bpm, Pr int 170 ms, QRS int 88 ms, QTc int 449 ms   Recent Labs: 05/04/2014: B Natriuretic Peptide 83.0 09/07/2014: Hemoglobin 16.4; Platelets 175 10/28/2014: ALT 25; BUN 13; Creatinine, Ser 1.11; Magnesium 1.8; Potassium 4.1; Sodium 139; TSH 1.464    Lipid Panel     Component Value Date/Time   CHOL 146 04/17/2013 1017   TRIG 83.0 04/17/2013 1017   HDL 45.80 04/17/2013 1017   CHOLHDL 3 04/17/2013 1017   VLDL 16.6 04/17/2013 1017   LDLCALC 84 04/17/2013 1017   LDLDIRECT 57.8 03/10/2012 0834     Wt Readings from Last 3 Encounters:  11/29/14 222 lb 6.4 oz (100.88 kg)  10/28/14 221 lb 3.2 oz (100.336 kg)  10/13/14 224 lb 14.4 oz (102.014 kg)      Epic records reviewed.   ASSESSMENT AND PLAN:  1.  Persistent afib Has converted to SR with  amiodarone 200 mg bid. Decrease amiodarone to 200 mg a day Continue Eliquis 5 mg bid  F/u with pulmonology in 2-3 weeks. Pt to call and make appointment.  2, Tobacco abuse Encouraged to stop smoking.   3.HTN Stable  F/u with [name] as scheduled 8/29  Afib clinic in one month Will need liver tsh labs.     Labs/ tests ordered today include:  Orders Placed This Encounter  Procedures  . EKG 12-Lead     Signed, CARROLL,DONNA, NP  11/29/2014 4:29 PM

## 2014-12-02 ENCOUNTER — Telehealth (HOSPITAL_COMMUNITY): Payer: Self-pay | Admitting: Nurse Practitioner

## 2014-12-02 NOTE — Telephone Encounter (Signed)
Wife called and said pt was noticing by fitbit that heart rates were falling into the 40's at night, asymptomatic. He is not in SR with recent amiodarone load. Will reduce metoprolol to 50 mg bid from 75 mg bid. Can report in one week what heart rate is doing at night per fitbit. I also explained to her that heart rate is slower at night .

## 2014-12-06 ENCOUNTER — Ambulatory Visit (INDEPENDENT_AMBULATORY_CARE_PROVIDER_SITE_OTHER): Payer: Federal, State, Local not specified - PPO | Admitting: Cardiology

## 2014-12-06 ENCOUNTER — Ambulatory Visit: Payer: Medicare Other | Admitting: Cardiology

## 2014-12-06 ENCOUNTER — Encounter: Payer: Self-pay | Admitting: Cardiology

## 2014-12-06 VITALS — BP 112/74 | HR 51 | Ht 71.0 in | Wt 224.0 lb

## 2014-12-06 DIAGNOSIS — R0600 Dyspnea, unspecified: Secondary | ICD-10-CM

## 2014-12-06 DIAGNOSIS — E669 Obesity, unspecified: Secondary | ICD-10-CM

## 2014-12-06 DIAGNOSIS — R0609 Other forms of dyspnea: Secondary | ICD-10-CM

## 2014-12-06 DIAGNOSIS — I10 Essential (primary) hypertension: Secondary | ICD-10-CM | POA: Diagnosis not present

## 2014-12-06 DIAGNOSIS — R600 Localized edema: Secondary | ICD-10-CM

## 2014-12-06 DIAGNOSIS — E785 Hyperlipidemia, unspecified: Secondary | ICD-10-CM

## 2014-12-06 DIAGNOSIS — F17210 Nicotine dependence, cigarettes, uncomplicated: Secondary | ICD-10-CM

## 2014-12-06 DIAGNOSIS — I481 Persistent atrial fibrillation: Secondary | ICD-10-CM | POA: Diagnosis not present

## 2014-12-06 DIAGNOSIS — I4819 Other persistent atrial fibrillation: Secondary | ICD-10-CM

## 2014-12-06 DIAGNOSIS — Z79899 Other long term (current) drug therapy: Secondary | ICD-10-CM

## 2014-12-06 NOTE — Patient Instructions (Signed)
DR HARDING SUGGEST YOU TAKE LISINOPRIL ON THE MORNING WITH MORNING DOSE METOPROLOL, AND DILTIAZEM IN THE EVENING WITH THE METOPROLEVENING DOSE.  You may use an extra dose of lasix (furosemide) 20 mg  For swelling . Please use support hose . You can buy over the counter.  NO CHANGE WITH ANOTHER MEDICATIONS  Your physician wants you to follow-up in Feb 2017 with [name].   You will receive a reminder letter in the mail two months in advance. If you don't receive a letter, please call our office to schedule the follow-up appointment.

## 2014-12-06 NOTE — Progress Notes (Signed)
PCP: [name], MD  Clinic Note: Chief Complaint  Patient presents with  . 3 MONTH FOLLOW UP    Patient has had swelling in his feet and ankles.  . Atrial Fibrillation    HPI: Ronald [name] is a 69 y.o. male with a PMH below who presents today for scheduled 74-month f/u of Afib & HTN/HLD/leg edema. He was initially diagnosed with A. Fib in January 2016 during preoperative assessment for bilateral hydrocelectomy.the surgery was canceled, and EKG indicated A. Fib with RVR. He was started on calcium channel blocker and beta blocker for rate control. Initially he was noted to be in sinus rhythm, but then reverted back into atrial fibrillation. I last saw Kaycen in May, he was noted to be back in A. Fib with RVR despite medical managementon both beta blocker and calcium blocker for rate control. This happened despite increasing his beta blocker over the telephone.  He was started on flecainide 100 mg twice a day with initial loading dose. He was then referred to the atrial fibrillation clinic, where he has been seen several times in the last few months.  Afib Clinic:  [name], NP Butch Penny) 5/31: followup EKG had shown A. Fib with better rate control.  Mildly symptomatic at that time. Was felt to have failed flecainide.  DCCV 6/2 -- NSR  Butch Penny) 6/6: in A. Fib. Definitely symptomatic with increased heart rate. Has had rates up to 130s (followed usinghis FitBit device) associated with chest tightness -- had been continued on flecainide (do your cardioversion thought to be related to LA dilation) --> d/c Flecainide  Dr.Allred, 7/6: weight control. Only dyspnea with moderate exertion.  Given options about Tikosyn versus amiodarone for rhythm control.  BB dose increased - plan was for Rate control.  Butch Penny) 7/21: he indicated that he was just was simply too symptomatic even with rate controlled A. Fib.   Declined Tikosyn because of need for hospitalization.  Started on  amiodarone With oral loading dose.  Now finally down to maintenance dose of 200 mg daily with hopes for chemical cardioversion versus assisted DCCV.     It would appear that he did chemically cardiovert sometime in middle of last week, because his symptoms notably improved.  Recent Hospitalizations: none other than for DCCV.  Studies Reviewed: no new studies.  Compliant with CPAP for OSA. CHADS2Vasc score of 2 (HTN/AGE) and is being compliant with Eliquis without bleeding.  Interval History: Ronald [name] comes in today feeling so much better now that he is in a normal rhythm. He knows when he went out of A. Fib. He has much better energy and much less of the anxiety and jitteriness that he had while in A. Fib. He is not having anymore of the exertional dyspnea or chest tightness/pressure. The only thing he does notice that he has been having some pedal edema that is has gone up since increasing his metoprolol dose. He is now reduced it back down from 75mg  to 50 mg twice a day.   He notes occasional palpitations, but not anything like he had with A. Fib. He has not had any bleeding issues with Eliquis (no melena, hematochezia, hematuria or epistaxis).   No chest pain or shortness of breath with rest or exertion. No PND, orthopnea.  No lightheadedness, dizziness, weakness or syncope/near syncope. No TIA/amaurosis fugax symptoms. No claudication.  Past Medical History  Diagnosis Date  . Hypertension   . Hyperlipidemia   . Gout   . Tinea versicolor   .  OSA on CPAP     per study 08-03-2004  Severe OSA  . Allergic rhinitis   . Prostate cancer     Dx  2014--  stage T1c, Gleason 3+3=6, PSA 6.67--  Active surveillance  . Bilateral hydrocele   . History of colon polyps     tubular adenoma 2013  . Sigmoid diverticulosis   . Cigarette smoker two packs a day or less     Past Surgical History  Procedure Laterality Date  . Colonoscopy  last one 06-07-2011  . Prostate biopsy  05/15/12     Clinically both Lobes  . Tonsillectomy  as child  . Laminectomy and microdiscectomy lumbar spine  12-23-2003    Left L5 -- S1  . Laparoscopic bilateral inguinal hernia repair/  umbilical hernia repair with mesh/  aspiration left hydrocele  07-11-2012  . Transthoracic echocardiogram  05/01/2014    mild LVH; EF 50-55% (cannot measure diastolic Fxn with Afib), Mod LA Dilation, mild MR  . Tee without cardioversion N/A 05/07/2014    Procedure: TRANSESOPHAGEAL ECHOCARDIOGRAM (TEE);  Surgeon: [name], MD;  Location: [facility] ENDOSCOPY;  Service: Cardiovascular;  Laterality: N/A;  . Cardioversion N/A 05/07/2014    Procedure: CARDIOVERSION;  Surgeon: [name], MD;  Location: [place];  Service: Cardiovascular;  Laterality: N/A;  . Nm myoview ltd  05/18/2014    Low risk study. Normal perfusion: No ischemia or infarction. Mild LV dysfunction - 46% (does not correlate with echocardiographic EF of 50-55%)  . Cardioversion N/A 09/09/2014    Procedure: CARDIOVERSION;  Surgeon: Lelon Perla, MD;  Location: [facility] ENDOSCOPY;  Service: Cardiovascular;  Laterality: N/A;    ROS: A comprehensive was performed. Review of Systems  Constitutional: Negative for malaise/fatigue.  Respiratory: Negative for shortness of breath.   Cardiovascular: Negative for claudication.  Skin:       He does note a mild bluish hue. To his nose and ears  Neurological: Negative for dizziness, tingling and headaches.  Endo/Heme/Allergies: Does not bruise/bleed easily.  Psychiatric/Behavioral: Negative for depression. The patient is not nervous/anxious (Notably less anxious now that he is in sinus rhythm).   All other systems reviewed and are negative.   Prior to Admission medications   Medication Sig Start Date End Date Taking? Authorizing Provider  acetaminophen (TYLENOL) 500 MG tablet Take 500 mg by mouth every 6 (six) hours as needed for mild pain or moderate pain.   Yes Historical Provider, MD  amiodarone (PACERONE) 200 MG  tablet Take 1 tablet (200 mg total) by mouth daily. 11/29/14  Yes Sherran Needs, NP  atorvastatin (LIPITOR) 10 MG tablet Take 1 tablet (10 mg total) by mouth every evening. 05/08/14  Yes Modena Jansky, MD  diltiazem (CARDIZEM CD) 360 MG 24 hr capsule Take 1 capsule (360 mg total) by mouth daily. 07/23/14  Yes [name], MD  ELIQUIS 5 MG TABS tablet TAKE 1 TABLET TWICE A DAY 11/18/14  Yes [name], MD  fluticasone (FLONASE) 50 MCG/ACT nasal spray Place 2 sprays into both nostrils daily as needed for allergies or rhinitis. 05/08/14  Yes Modena Jansky, MD  furosemide (LASIX) 40 MG tablet TAKE 1 TABLET BY MOUTH DAILY 06/02/14  Yes [name], MD  lisinopril (PRINIVIL,ZESTRIL) 20 MG tablet Take 1 tablet (20 mg total) by mouth every evening. 05/08/14  Yes Modena Jansky, MD  lisinopril (PRINIVIL,ZESTRIL) 20 MG tablet TAKE 1 TABLET (20 MG TOTAL) BY MOUTH DAILY. 11/15/14  Yes [name],  MD  metoprolol (LOPRESSOR) 50 MG tablet Take 1.5 tablets (75 mg total) by mouth 2 (two) times daily. 10/21/14  Yes Sherran Needs, NP  triamcinolone cream (KENALOG) 0.1 % Apply 1 application topically 2 (two) times daily as needed (for rash). 05/08/14  Yes Modena Jansky, MD   No Known Allergies   Social History   Social History  . Marital Status: Married    Spouse Name: N/A  . Number of Children: N/A  . Years of Education: N/A   Occupational History  . Sales    Social History Main Topics  . Smoking status: Current Every Day Smoker -- 1.00 packs/day for 55 years    Types: Cigarettes  . Smokeless tobacco: Never Used  . Alcohol Use: 3.0 oz/week    6 Standard drinks or equivalent per week  . Drug Use: No  . Sexual Activity: Not Asked   Other Topics Concern  . None   Social History Narrative   Married 25 years   2 children but not with this wife   Family History  Problem Relation Age of Onset  . Stroke      Unknown   . Colon cancer Neg Hx   . Esophageal cancer Neg Hx    . Rectal cancer Neg Hx   . Stomach cancer Neg Hx   . Breast cancer Mother   . Ovarian cancer Sister     Wt Readings from Last 3 Encounters:  12/06/14 224 lb (101.606 kg)  11/29/14 222 lb 6.4 oz (100.88 kg)  10/28/14 221 lb 3.2 oz (100.336 kg)    PHYSICAL EXAM BP 112/74 mmHg  Pulse 51  Ht 5\' 11"  (1.803 m)  Wt 224 lb (101.606 kg)  BMI 31.26 kg/m2 General appearance: alert, cooperative, appears stated age, no distress and mildy obese Neck: no adenopathy, no carotid bruit and no JVD Lungs: clear to auscultation bilaterally, normal percussion bilaterally and non-labored Heart: Bradycardic rate and rhythm, S1, S2 normal, no murmur, click, rub or gallop; Abdomen: soft, non-tender; bowel sounds normal; no masses,  no organomegaly; mild truncal obesity Extremities: extremities normal, atraumatic, no cyanosis, and edema  -- trace ankle Pulses: 2+ and symmetric; Skin: normal and mobility and turgor normal ; mild spider veins on bilateral ankles. Neurologic: Mental status: Alert, oriented, thought content appropriate Cranial nerves: normal (II-XII grossly intact)    Adult ECG Report   Rate: 51 ;  Rhythm: sinus bradycardia and Choroid progression in anterior leads with nonspecific ST and T-wave changes.;   Narrative Interpretation: essentially stable EKG with sinus bradycardia, no change from 11/29/2014   Other studies Reviewed: Additional studies/ records that were reviewed today include:  Recent Labs:       Chemistry      Component Value Date/Time   NA 139 10/28/2014 1200   K 4.1 10/28/2014 1200   CL 100* 10/28/2014 1200   CO2 31 10/28/2014 1200   BUN 13 10/28/2014 1200   CREATININE 1.11 10/28/2014 1200      Component Value Date/Time   CALCIUM 9.1 10/28/2014 1200   ALKPHOS 81 10/28/2014 1200   AST 19 10/28/2014 1200   ALT 25 10/28/2014 1200   BILITOT 0.8 10/28/2014 1200     Lab Results  Component Value Date   TSH 1.464 10/28/2014       ASSESSMENT /  PLAN: Problem List Items Addressed This Visit    Atrial fibrillation, persistent (Chronic)    He finally seems to be stable and maintained a sinus rhythm,  albeit sinus bradycardia now that he is on amiodarone. Basically we need to be considering rhythm control over rate control. His symptoms have significantly improved, and he says he is able to get his heart rate up with walking enough to not be concerned about symptomatic bradycardia/tachycardia bradycardia. Remains on Eliquis without bleeding issues.  Continue to followup in the Atrial Fibrillation Clinic Anticoagulated with Eliquis. No bleeding issues.      Relevant Orders   EKG 12-Lead (Completed)   Cigarette smoker two packs a day or less (Chronic)    He indicates he was "trying to quit". Gradually reducing how much he smokes. Smoking cessation instruction/counseling given:  counseled patient on the dangers of tobacco use, advised patient to stop smoking, and reviewed strategies to maximize success. ~1-2 min only (he is not overly receptive)      Edema of both legs    I don't think his anemia is related to heart failure. More likely related to venous insufficiency as he had the spider veins and mild skin discoloration. I recommended using compression stockings versus support hose. Probably he should use support hose initially. Also talked about continued to walk, and when sitting on the importance of using light exercises. She also did additional 20 mg Lasix when necessary for worsening edema.      Relevant Orders   EKG 12-Lead (Completed)   Essential hypertension - Primary (Chronic)    Well controlled on ACE inhibitor, calcium channel blocker and beta blocker. If rate continued to be low, can probably back off on either transabdominal or beta blocker as long as he is on amiodarone.      Relevant Orders   EKG 12-Lead (Completed)   Exertional dyspnea (Chronic)    Overall much better now he is in sinus rhythm. Likely was  attributable to be in atrial fibrillation versus sinus.      Hyperlipidemia (Chronic)    On statin. Monitored by PCP.      Relevant Orders   EKG 12-Lead (Completed)   Obesity (BMI 30-39.9) (Chronic)    The patient understands the need to lose weight with diet and exercise. We have discussed specific strategies for this.      On amiodarone therapy (Chronic)    Discussed importance of followup maintenance. Due for PFTs to be checked soon. He does have a pulmonologist as well. Needs a followup TSH and LFT levels. Will also need annual eye exams. We discussed mild skin discoloration of the side effect of amiodarone treatment. I don't know he's been on and off to a board he had some discoloration. He is fine as long as he maintains sinus rhythm.         Current medicines are reviewed at length with the patient today. (+/- concerns) how much he urinates with Lasix The following changes have been made: see below Studies Ordered:   Orders Placed This Encounter  Procedures  . EKG 12-Lead    PATIENT INSTRUCTIONS:  TAKE LISINOPRIL ON THE MORNING WITH MORNING DOSE METOPROLOL, AND DILTIAZEM IN THE EVENING WITH THE METOPROLEVENING DOSE.  You may use an extra dose of lasix (furosemide) 20 mg  For swelling . Please use support hose . You can buy over the counter.  NO CHANGE WITH ANOTHER MEDICATIONS  Your physician wants you to follow-up in Feb 2017 with [name].   followup with atrial fibrillation clinic as scheduled   Geniene List, Leonie Green, M.D., M.S. Interventional Cardiologist   Pager # [phone]

## 2014-12-07 ENCOUNTER — Encounter: Payer: Self-pay | Admitting: Cardiology

## 2014-12-07 LAB — HM DIABETES EYE EXAM

## 2014-12-08 DIAGNOSIS — R600 Localized edema: Secondary | ICD-10-CM | POA: Insufficient documentation

## 2014-12-08 DIAGNOSIS — Z79899 Other long term (current) drug therapy: Secondary | ICD-10-CM | POA: Insufficient documentation

## 2014-12-08 NOTE — Assessment & Plan Note (Signed)
I don't think his anemia is related to heart failure. More likely related to venous insufficiency as he had the spider veins and mild skin discoloration. I recommended using compression stockings versus support hose. Probably he should use support hose initially. Also talked about continued to walk, and when sitting on the importance of using light exercises. She also did additional 20 mg Lasix when necessary for worsening edema.

## 2014-12-08 NOTE — Assessment & Plan Note (Signed)
The patient understands the need to lose weight with diet and exercise. We have discussed specific strategies for this.  

## 2014-12-08 NOTE — Assessment & Plan Note (Signed)
On statin. Monitored by PCP. 

## 2014-12-08 NOTE — Assessment & Plan Note (Signed)
Well controlled on ACE inhibitor, calcium channel blocker and beta blocker. If rate continued to be low, can probably back off on either transabdominal or beta blocker as long as he is on amiodarone.

## 2014-12-08 NOTE — Assessment & Plan Note (Signed)
Discussed importance of followup maintenance. Due for PFTs to be checked soon. He does have a pulmonologist as well. Needs a followup TSH and LFT levels. Will also need annual eye exams. We discussed mild skin discoloration of the side effect of amiodarone treatment. I don't know he's been on and off to a board he had some discoloration. He is fine as long as he maintains sinus rhythm.

## 2014-12-08 NOTE — Assessment & Plan Note (Addendum)
He finally seems to be stable and maintained a sinus rhythm, albeit sinus bradycardia now that he is on amiodarone. Basically we need to be considering rhythm control over rate control. His symptoms have significantly improved, and he says he is able to get his heart rate up with walking enough to not be concerned about symptomatic bradycardia/tachycardia bradycardia. Remains on Eliquis without bleeding issues.  Continue to followup in the [facility] Anticoagulated with Eliquis. No bleeding issues.

## 2014-12-08 NOTE — Assessment & Plan Note (Signed)
Overall much better now he is in sinus rhythm. Likely was attributable to be in atrial fibrillation versus sinus.

## 2014-12-08 NOTE — Assessment & Plan Note (Signed)
He indicates he was "trying to quit". Gradually reducing how much he smokes. Smoking cessation instruction/counseling given:  counseled patient on the dangers of tobacco use, advised patient to stop smoking, and reviewed strategies to maximize success. ~1-2 min only (he is not overly receptive)

## 2014-12-14 DIAGNOSIS — H2513 Age-related nuclear cataract, bilateral: Secondary | ICD-10-CM | POA: Diagnosis not present

## 2014-12-24 ENCOUNTER — Encounter: Payer: Self-pay | Admitting: Family Medicine

## 2014-12-27 ENCOUNTER — Ambulatory Visit (HOSPITAL_COMMUNITY)
Admission: RE | Admit: 2014-12-27 | Discharge: 2014-12-27 | Disposition: A | Discharge status: Home / Self Care | Payer: Federal, State, Local not specified - PPO | Source: Ambulatory Visit | Attending: Nurse Practitioner | Admitting: Nurse Practitioner

## 2014-12-27 ENCOUNTER — Encounter (HOSPITAL_COMMUNITY): Payer: Self-pay | Admitting: Nurse Practitioner

## 2014-12-27 VITALS — BP 122/80 | HR 50 | Ht 71.0 in | Wt 226.8 lb

## 2014-12-27 DIAGNOSIS — E785 Hyperlipidemia, unspecified: Secondary | ICD-10-CM | POA: Insufficient documentation

## 2014-12-27 DIAGNOSIS — Z79899 Other long term (current) drug therapy: Secondary | ICD-10-CM | POA: Insufficient documentation

## 2014-12-27 DIAGNOSIS — I4819 Other persistent atrial fibrillation: Secondary | ICD-10-CM

## 2014-12-27 DIAGNOSIS — F17210 Nicotine dependence, cigarettes, uncomplicated: Secondary | ICD-10-CM | POA: Diagnosis not present

## 2014-12-27 DIAGNOSIS — J984 Other disorders of lung: Secondary | ICD-10-CM | POA: Diagnosis not present

## 2014-12-27 DIAGNOSIS — I481 Persistent atrial fibrillation: Secondary | ICD-10-CM | POA: Diagnosis not present

## 2014-12-27 DIAGNOSIS — G4733 Obstructive sleep apnea (adult) (pediatric): Secondary | ICD-10-CM | POA: Diagnosis not present

## 2014-12-27 DIAGNOSIS — Z7902 Long term (current) use of antithrombotics/antiplatelets: Secondary | ICD-10-CM | POA: Insufficient documentation

## 2014-12-27 DIAGNOSIS — Z8546 Personal history of malignant neoplasm of prostate: Secondary | ICD-10-CM | POA: Insufficient documentation

## 2014-12-27 DIAGNOSIS — I4891 Unspecified atrial fibrillation: Secondary | ICD-10-CM | POA: Insufficient documentation

## 2014-12-27 DIAGNOSIS — I10 Essential (primary) hypertension: Secondary | ICD-10-CM | POA: Insufficient documentation

## 2014-12-27 LAB — HEPATIC FUNCTION PANEL
ALBUMIN: 4 g/dL (ref 3.5–5.0)
ALT: 24 U/L (ref 17–63)
AST: 27 U/L (ref 15–41)
Alkaline Phosphatase: 86 U/L (ref 38–126)
BILIRUBIN TOTAL: 0.9 mg/dL (ref 0.3–1.2)
Bilirubin, Direct: 0.2 mg/dL (ref 0.1–0.5)
Indirect Bilirubin: 0.7 mg/dL (ref 0.3–0.9)
TOTAL PROTEIN: 6.7 g/dL (ref 6.5–8.1)

## 2014-12-27 LAB — TSH: TSH: 3.9 u[IU]/mL (ref 0.350–4.500)

## 2014-12-27 MED ORDER — METOPROLOL TARTRATE 25 MG PO TABS: 25.0000 mg | ORAL_TABLET | Freq: Two times a day (BID) | ORAL | Status: DC

## 2014-12-27 NOTE — Progress Notes (Signed)
Patient ID: Ronald [name], male   DOB: Apr 23, 1945, 69 y.o.   MRN: [id number]     Primary Care Physician: [name], MD Referring Physician: Dr. Coralee Pesa is a 69 y.o. male with a h/o persistent afib that is maintaining SR on amiodarone. He continues to feel better in SR but still doesn't not think his breathing is optimal. Has a congested cough at times. He used some Muccinex plain last week with improvement.  He does however continue to smoke. He is using his cpap machine. He does f/u with pulmonologist in early October. Had his eyes examined the other day and got a good report. His heart rate is on the slow side and will adjust metoprolol today to allow heart rate to drift up.  Today, he denies symptoms of palpitations, chest pain, shortness of breath, orthopnea, PND, lower extremity edema, dizziness, presyncope, syncope, or neurologic sequela. The patient is tolerating medications without difficulties and is otherwise without complaint today.   Past Medical History  Diagnosis Date  . Hypertension   . Hyperlipidemia   . Gout   . Tinea versicolor   . OSA on CPAP     per study 08-03-2004  Severe OSA  . Allergic rhinitis   . Prostate cancer     Dx  2014--  stage T1c, Gleason 3+3=6, PSA 6.67--  Active surveillance  . Bilateral hydrocele   . History of colon polyps     tubular adenoma 2013  . Sigmoid diverticulosis   . Cigarette smoker two packs a day or less    Past Surgical History  Procedure Laterality Date  . Colonoscopy  last one 06-07-2011  . Prostate biopsy  05/15/12    Clinically both Lobes  . Tonsillectomy  as child  . Laminectomy and microdiscectomy lumbar spine  12-23-2003    Left L5 -- S1  . Laparoscopic bilateral inguinal hernia repair/  umbilical hernia repair with mesh/  aspiration left hydrocele  07-11-2012  . Transthoracic echocardiogram  05/01/2014    mild LVH; EF 50-55% (cannot measure diastolic Fxn with Afib), Mod LA Dilation, mild MR    . Tee without cardioversion N/A 05/07/2014    Procedure: TRANSESOPHAGEAL ECHOCARDIOGRAM (TEE);  Surgeon: [name], MD;  Location: [facility] ENDOSCOPY;  Service: Cardiovascular;  Laterality: N/A;  . Cardioversion N/A 05/07/2014    Procedure: CARDIOVERSION;  Surgeon: [name], MD;  Location: [place];  Service: Cardiovascular;  Laterality: N/A;  . Nm myoview ltd  05/18/2014    Low risk study. Normal perfusion: No ischemia or infarction. Mild LV dysfunction - 46% (does not correlate with echocardiographic EF of 50-55%)  . Cardioversion N/A 09/09/2014    Procedure: CARDIOVERSION;  Surgeon: Lelon Perla, MD;  Location: [facility] ENDOSCOPY;  Service: Cardiovascular;  Laterality: N/A;    Current Outpatient Prescriptions  Medication Sig Dispense Refill  . acetaminophen (TYLENOL) 500 MG tablet Take 500 mg by mouth every 6 (six) hours as needed for mild pain or moderate pain.    Marland Kitchen amiodarone (PACERONE) 200 MG tablet Take 1 tablet (200 mg total) by mouth daily. 30 tablet 6  . atorvastatin (LIPITOR) 10 MG tablet Take 1 tablet (10 mg total) by mouth every evening.    . diltiazem (CARDIZEM CD) 360 MG 24 hr capsule Take 1 capsule (360 mg total) by mouth daily. 90 capsule 1  . ELIQUIS 5 MG TABS tablet TAKE 1 TABLET TWICE A DAY 180 tablet 1  . fluticasone (FLONASE) 50 MCG/ACT nasal  spray Place 2 sprays into both nostrils daily as needed for allergies or rhinitis.    . furosemide (LASIX) 40 MG tablet TAKE 1 TABLET BY MOUTH DAILY 30 tablet 11  . lisinopril (PRINIVIL,ZESTRIL) 20 MG tablet TAKE 1 TABLET (20 MG TOTAL) BY MOUTH DAILY. 90 tablet 1  . metoprolol (LOPRESSOR) 25 MG tablet Take 1 tablet (25 mg total) by mouth 2 (two) times daily.    Marland Kitchen triamcinolone cream (KENALOG) 0.1 % Apply 1 application topically 2 (two) times daily as needed (for rash).     No current facility-administered medications for this encounter.    No Known Allergies  Social History   Social History  . Marital Status: Married     Spouse Name: N/A  . Number of Children: N/A  . Years of Education: N/A   Occupational History  . Sales    Social History Main Topics  . Smoking status: Current Every Day Smoker -- 1.00 packs/day for 55 years    Types: Cigarettes  . Smokeless tobacco: Never Used  . Alcohol Use: 3.0 oz/week    6 Standard drinks or equivalent per week  . Drug Use: No  . Sexual Activity: Not on file   Other Topics Concern  . Not on file   Social History Narrative   Married 25 years   2 children but not with this wife    Family History  Problem Relation Age of Onset  . Stroke      Unknown   . Colon cancer Neg Hx   . Esophageal cancer Neg Hx   . Rectal cancer Neg Hx   . Stomach cancer Neg Hx   . Breast cancer Mother   . Ovarian cancer Sister     ROS- All systems are reviewed and negative except as per the HPI above  Physical Exam: Filed Vitals:   12/27/14 0956  BP: 122/80  Pulse: 50  Height: 5\' 11"  (1.803 m)  Weight: 226 lb 12.8 oz (102.876 kg)    GEN- The patient is well appearing, alert and oriented x 3 today.   Head- normocephalic, atraumatic Eyes-  Sclera clear, conjunctiva pink Ears- hearing intact Oropharynx- clear Neck- supple, no JVP Lymph- no cervical lymphadenopathy Lungs- Clear to ausculation bilaterally, normal work of breathing Heart- Regular rate and rhythm, no murmurs, rubs or gallops, PMI not laterally displaced GI- soft, NT, ND, + BS Extremities- no clubbing, cyanosis, or edema MS- no significant deformity or atrophy Skin- no rash or lesion Psych- euthymic mood, full affect Neuro- strength and sensation are intact  EKG- SB at 50 bpm, otherwise normal EKG. PR int 158 ms, QRS int 94 ms, QTc 455 ms.  Assessment and Plan: 1. Afib Maintaining SR on amiodarone 200 mg a day Continue eliquis 5 mg bid TSH/liver panel today  2. Bradycardia Decrease metoprolol to 25 mg bid Would like to see HR  at least in upper 50's.  3. Lung disease/tobacco  abuse Encouraged to stop smoking F/u with pulmonologist as scheduled 10/13  Butch Penny C. [facility] [place], [place] [address] [phone]   EKG/BP in one week, f/u above BB change

## 2014-12-27 NOTE — Addendum Note (Signed)
Encounter addended by: Sherran Needs, NP on: 12/27/2014 10:51 AM<BR>     Documentation filed: Follow-up Section, LOS Section

## 2014-12-27 NOTE — Patient Instructions (Signed)
Your physician has recommended you make the following change in your medication:  1)Decrease metoprolol 25mg  twice a day  Parking code [phone]

## 2015-01-03 ENCOUNTER — Ambulatory Visit (HOSPITAL_COMMUNITY)
Admission: RE | Admit: 2015-01-03 | Discharge: 2015-01-03 | Disposition: A | Discharge status: Home / Self Care | Payer: Federal, State, Local not specified - PPO | Source: Ambulatory Visit | Attending: Nurse Practitioner | Admitting: Nurse Practitioner

## 2015-01-03 DIAGNOSIS — I4891 Unspecified atrial fibrillation: Secondary | ICD-10-CM | POA: Diagnosis not present

## 2015-01-03 DIAGNOSIS — I491 Atrial premature depolarization: Secondary | ICD-10-CM | POA: Insufficient documentation

## 2015-01-03 DIAGNOSIS — I480 Paroxysmal atrial fibrillation: Secondary | ICD-10-CM | POA: Diagnosis not present

## 2015-01-03 NOTE — Progress Notes (Addendum)
Pt's visit is just for EKG and BP check Only. Butch Penny will review with pt.   Decreased BB to metoprolol 25 mg bid for bradycardia and fatigue. He feels better on lower dose, staying in [place].   Energy, breathing improved, feels better on lower dose. Maintaining SR, has f/u  with me  10/17. Will consider  reducing bb more at that time. BP 138/82.

## 2015-01-20 ENCOUNTER — Encounter: Payer: Self-pay | Admitting: Pulmonary Disease

## 2015-01-20 ENCOUNTER — Ambulatory Visit (INDEPENDENT_AMBULATORY_CARE_PROVIDER_SITE_OTHER): Payer: Federal, State, Local not specified - PPO | Admitting: Pulmonary Disease

## 2015-01-20 VITALS — BP 124/70 | HR 55 | Temp 98.9°F | Ht 71.0 in | Wt 228.2 lb

## 2015-01-20 DIAGNOSIS — Z23 Encounter for immunization: Secondary | ICD-10-CM

## 2015-01-20 DIAGNOSIS — R942 Abnormal results of pulmonary function studies: Secondary | ICD-10-CM

## 2015-01-20 DIAGNOSIS — G4733 Obstructive sleep apnea (adult) (pediatric): Secondary | ICD-10-CM | POA: Diagnosis not present

## 2015-01-20 DIAGNOSIS — Z720 Tobacco use: Secondary | ICD-10-CM | POA: Diagnosis not present

## 2015-01-20 DIAGNOSIS — R0600 Dyspnea, unspecified: Secondary | ICD-10-CM | POA: Diagnosis not present

## 2015-01-20 DIAGNOSIS — Z9989 Dependence on other enabling machines and devices: Secondary | ICD-10-CM

## 2015-01-20 NOTE — Patient Instructions (Signed)
Will schedule CT chest and call with results  Can try either of following medications to help stop smoking: Bupropion Chantix  Flu shot today  Follow up in 4 months

## 2015-01-20 NOTE — Progress Notes (Signed)
Chief Complaint  Patient presents with  . Follow-up    Former [facility] pt following for OSA. pt states he has a new CPAP hes had for about 6 months and it is working great for him. pt using CPAP every night for about  8 hours a night. mask and pressure good for pt. pt has a concern of a A Fib  medication Amiodarone affecting his breathing due to an increase of SOB he was having. DME: [facility]     History of Present Illness: Ronald [name] is a 69 y.o. male smoker with OSA, and dyspnea.  He was previously followed by Dr. Gwenette Greet.  He has been followed by cardiology for a fib.  He has noticed trouble with his breathing with exertion.  He still smokes cigarettes.  He had PFT in July 2016 which showed moderate diffusion defect.  His last CXR was from January 2016 which showed pleural effusions.  He has been on amiodarone for his A fib >> this was started in October 28, 2014.  His breathing was worse when he was in a fib >> better once he was converted to sinus rhythm.  He never felt palpitations.  He still smokes about 1 to 1.5 packs per day.  He denies occupational exposures.  There is no hx of pneumonia, or exposure to tuberculosis.  He gets occasional cough with clear sputum. He has not been getting wheeze, and denies hemoptysis.  He had leg swelling when he was in a fib >> much better now.   TESTS: PSG 07/14/04 >> AHI 77 Echo 05/04/14 >> mild LVH, EF 50 to 55%, mild MR, mod LA dilation PFT 11/01/14 >> FEV1 3.42 (104%), FEV1% 81, TLC 7.00 (99%), DLCO 55%, + BD Auto CPAP 12/20/14 to 01/18/15 >> used on 30 of 30 nights with average 9 hrs and 26 min.  Average AHI is 2 with median CPAP 8 cm H2O and 95 th percentile CPAP 11 cm H20.   PMhx >> A fib, HTN, HLD, Gout, Tinea versicolor, Prostate cancer  Past surgical hx, Medications, Allergies, Family hx, Social hx all reviewed.   Physical Exam: BP 124/70 mmHg  Pulse 55  Temp(Src) 98.9 F (37.2 C) (Oral)  Ht 5\' 11"  (1.803 m)  Wt 228 lb 3.2 oz (103.511 kg)   BMI 31.84 kg/m2  SpO2 98%  General - No distress ENT - No sinus tenderness, no oral exudate, no LAN Cardiac - s1s2 regular, no murmur Chest - No wheeze/rales/dullness Back - No focal tenderness Abd - Soft, non-tender Ext - No edema Neuro - Normal strength Skin - No rashes Psych - normal mood, and behavior   CMP Latest Ref Rng 12/27/2014 10/28/2014 09/07/2014  Glucose 65 - 99 mg/dL - 158(H) 118(H)  BUN 6 - 20 mg/dL - 13 15  Creatinine 0.61 - 1.24 mg/dL - 1.11 1.02  Sodium 135 - 145 mmol/L - 139 139  Potassium 3.5 - 5.1 mmol/L - 4.1 3.8  Chloride 101 - 111 mmol/L - 100(L) 103  CO2 22 - 32 mmol/L - 31 25  Calcium 8.9 - 10.3 mg/dL - 9.1 9.1  Total Protein 6.5 - 8.1 g/dL 6.7 6.5 -  Total Bilirubin 0.3 - 1.2 mg/dL 0.9 0.8 -  Alkaline Phos 38 - 126 U/L 86 81 -  AST 15 - 41 U/L 27 19 -  ALT 17 - 63 U/L 24 25 -    CBC Latest Ref Rng 09/07/2014 05/08/2014 05/07/2014  WBC 4.0 - 10.5 K/uL 6.8 7.8 7.0  Hemoglobin 13.0 - 17.0 g/dL 16.4 14.1 13.7  Hematocrit 39.0 - 52.0 % 47.4 41.4 43.1  Platelets 150 - 400 K/uL 175 197 192    Lab Results  Component Value Date   TSH 3.900 12/27/2014    Discussion: He has hx of smoking.  He has noticed dyspnea on exertion >> much worse before his A fib was converted to sinus rhythm.  He still has some breathing trouble.  His PFT from July 2016 showed moderate diffusion defect.  He has been doing well with CPAP.  Assessment/Plan:  Dyspnea with diffusion defect on PFT >> main concern is for emphysema. Plan: - will get high resolution CT chest - he would like to defer trial of inhaler therapy for now  Tobacco abuse. Plan: - had extensive discussion about options to help with smoking cessation >> he will let us know once he decide what he wants to try  Obstructive sleep apnea. He is compliant with CPAP and reports benefit from therapy. Plan: - continue auto CPAP  A fib. Plan: - he is to f/u with cardiology to discuss plans for  amiodarone    Chesley Mires, MD [place] [facility] Pager:  [phone]

## 2015-01-24 ENCOUNTER — Inpatient Hospital Stay (HOSPITAL_COMMUNITY)
Admission: RE | Admit: 2015-01-24 | Payer: Federal, State, Local not specified - PPO | Source: Ambulatory Visit | Admitting: Nurse Practitioner

## 2015-01-25 ENCOUNTER — Ambulatory Visit (HOSPITAL_COMMUNITY)
Admission: RE | Admit: 2015-01-25 | Discharge: 2015-01-25 | Disposition: A | Discharge status: Home / Self Care | Payer: Federal, State, Local not specified - PPO | Source: Ambulatory Visit | Attending: Nurse Practitioner | Admitting: Nurse Practitioner

## 2015-01-25 DIAGNOSIS — I480 Paroxysmal atrial fibrillation: Secondary | ICD-10-CM | POA: Diagnosis not present

## 2015-01-25 DIAGNOSIS — I481 Persistent atrial fibrillation: Secondary | ICD-10-CM | POA: Diagnosis not present

## 2015-01-25 LAB — TSH: TSH: 2.695 u[IU]/mL (ref 0.350–4.500)

## 2015-01-25 NOTE — Progress Notes (Signed)
Pt here for TSH labs and check HR of 55 and BP 142/78.Marland Kitchen

## 2015-01-27 ENCOUNTER — Other Ambulatory Visit: Payer: Federal, State, Local not specified - PPO

## 2015-01-31 ENCOUNTER — Ambulatory Visit (INDEPENDENT_AMBULATORY_CARE_PROVIDER_SITE_OTHER)
Admission: RE | Admit: 2015-01-31 | Discharge: 2015-01-31 | Disposition: A | Discharge status: Home / Self Care | Payer: Federal, State, Local not specified - PPO | Source: Ambulatory Visit | Attending: Pulmonary Disease | Admitting: Pulmonary Disease

## 2015-01-31 DIAGNOSIS — R0600 Dyspnea, unspecified: Secondary | ICD-10-CM | POA: Diagnosis not present

## 2015-01-31 DIAGNOSIS — R942 Abnormal results of pulmonary function studies: Secondary | ICD-10-CM | POA: Diagnosis not present

## 2015-01-31 DIAGNOSIS — Z720 Tobacco use: Secondary | ICD-10-CM

## 2015-01-31 DIAGNOSIS — R0989 Other specified symptoms and signs involving the circulatory and respiratory systems: Secondary | ICD-10-CM | POA: Diagnosis not present

## 2015-02-03 ENCOUNTER — Telehealth: Payer: Self-pay | Admitting: Pulmonary Disease

## 2015-02-03 MED ORDER — PREDNISONE 20 MG PO TABS: ORAL_TABLET | ORAL | Status: DC

## 2015-02-03 NOTE — Telephone Encounter (Signed)
lmtcb for pt.  

## 2015-02-03 NOTE — Telephone Encounter (Signed)
HRCT chest 01/31/15 >> diffuse centrilobular GGO micronodules, mild airtrapping, mild centrilobular/paraseptal emphysema   Results d/w pt.  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  Explained absolute need to stop smoking.  Will start him on prednisone 40 mg daily for 2 weeks, then 30 mg daily.    Will have my nurse schedule him for ROV in 4 weeks with me or Tammy Parrett.

## 2015-02-03 NOTE — Telephone Encounter (Signed)
Pt returned call and informed him that CT results have not been received and once reviewed by VS, his nurse would call to review his results and recs. Pt voiced understanding and had no further questions. Nothing further needed. Will sign off on message.

## 2015-02-04 NOTE — Telephone Encounter (Signed)
Scheduled the appt for 11/14  With TP pt is also wanting the results put into mychart

## 2015-02-14 DIAGNOSIS — C61 Malignant neoplasm of prostate: Secondary | ICD-10-CM | POA: Diagnosis not present

## 2015-02-14 DIAGNOSIS — R351 Nocturia: Secondary | ICD-10-CM | POA: Diagnosis not present

## 2015-02-14 DIAGNOSIS — N401 Enlarged prostate with lower urinary tract symptoms: Secondary | ICD-10-CM | POA: Diagnosis not present

## 2015-02-14 DIAGNOSIS — R972 Elevated prostate specific antigen [PSA]: Secondary | ICD-10-CM | POA: Diagnosis not present

## 2015-02-14 DIAGNOSIS — N433 Hydrocele, unspecified: Secondary | ICD-10-CM | POA: Diagnosis not present

## 2015-02-15 ENCOUNTER — Telehealth (HOSPITAL_COMMUNITY): Payer: Self-pay | Admitting: *Deleted

## 2015-02-15 NOTE — Telephone Encounter (Signed)
Office contacted regarding upcoming prostate biopsy in May 2017 and need for eliquis instructions. Form faxed with recommendations of holding eliquis 2-3 days is usually sufficient.

## 2015-02-21 ENCOUNTER — Ambulatory Visit (INDEPENDENT_AMBULATORY_CARE_PROVIDER_SITE_OTHER): Payer: Federal, State, Local not specified - PPO | Admitting: Adult Health

## 2015-02-21 ENCOUNTER — Telehealth: Payer: Self-pay | Admitting: Pulmonary Disease

## 2015-02-21 ENCOUNTER — Encounter: Payer: Self-pay | Admitting: Adult Health

## 2015-02-21 VITALS — BP 130/88 | HR 56 | Temp 98.0°F | Ht 71.0 in | Wt 228.0 lb

## 2015-02-21 DIAGNOSIS — Z720 Tobacco use: Secondary | ICD-10-CM | POA: Diagnosis not present

## 2015-02-21 DIAGNOSIS — J84115 Respiratory bronchiolitis interstitial lung disease: Secondary | ICD-10-CM

## 2015-02-21 DIAGNOSIS — G4733 Obstructive sleep apnea (adult) (pediatric): Secondary | ICD-10-CM | POA: Diagnosis not present

## 2015-02-21 MED ORDER — PREDNISONE 20 MG PO TABS: ORAL_TABLET | ORAL | Status: DC

## 2015-02-21 MED ORDER — NICOTINE 10 MG IN INHA: 1.0000 | RESPIRATORY_TRACT | Status: DC | PRN

## 2015-02-21 NOTE — Progress Notes (Signed)
Reviewed and agree with assessment/plan. 

## 2015-02-21 NOTE — Addendum Note (Signed)
Addended by: Osa Craver on: 02/21/2015 09:36 AM   Modules accepted: Orders

## 2015-02-21 NOTE — Progress Notes (Signed)
   Subjective:    Patient ID: Ronald [name], male    DOB: Apr 25, 1945, 69 y.o.   MRN: [id number]  HPI 70 yo smoker with OSA and dyspnea  Has Atrial Fib on Amiodarone   02/21/2015 Follow up :: OSA and Dyspnea  He returns for 1 month follow up . Presented last ov with progressive DOE.  CT done 10/24 showed diffuse centrilobular ground glass micronodularity and mild emphysema.  Was concerning for respiratory bronchiolitis ILD . He was encouraged on smoking cessation .  He was started on prednisone 40mg  daily for 2 weeks and taper to 30mg  daily .  He is on Amiodarone and Lisinopril . Says since starting prednisone  ,dyspnea and cough are better.  Still has minimally productive cough . No fever, discolored mucus, chest pain, orthopnea.   Discussed smoking cessation. Would like to try nicotrol inhaler.   Has OSA on CPAP  Download shows excellent compliance with good control . AHI 0.9 on autoset. avg usage 8.5hr . Min leaks.  Feels rested. .    TEST  PSG 07/14/04 >> AHI 77 Echo 05/04/14 >> mild LVH, EF 50 to 55%, mild MR, mod LA dilation PFT 11/01/14 >> FEV1 3.42 (104%), FEV1% 81, TLC 7.00 (99%), DLCO 55%, + BD Auto CPAP 12/20/14 to 01/18/15 >> used on 30 of 30 nights with average 9 hrs and 26 min.  Average AHI is 2 with median CPAP 8 cm H2O and 95 th percentile CPAP 11 cm H20.   Review of Systems Constitutional:   No  weight loss, night sweats,  Fevers, chills, fatigue, or  lassitude.  HEENT:   No headaches,  Difficulty swallowing,  Tooth/dental problems, or  Sore throat,                No sneezing, itching, ear ache, +asal congestion, post nasal drip,   CV:  No chest pain,  Orthopnea, PND, swelling in lower extremities, anasarca, dizziness, palpitations, syncope.   GI  No heartburn, indigestion, abdominal pain, nausea, vomiting, diarrhea, change in bowel habits, loss of appetite, bloody stools.   Resp no hemoptyiss   Skin: no rash or lesions.  GU: no dysuria, change in color of  urine, no urgency or frequency.  No flank pain, no hematuria   MS:  No joint pain or swelling.  No decreased range of motion.  No back pain.  Psych:  No change in mood or affect. No depression or anxiety.  No memory loss.         Objective:   Physical Exam  GEN: A/Ox3; pleasant , NAD, obese  VS reviewed   HEENT:  [place]/AT,  EACs-clear, TMs-wnl, NOSE-clear, THROAT-clear, no lesions, no postnasal drip or exudate noted. Poor dentition , Class 2 MP airway   NECK:  Supple w/ fair ROM; no JVD; normal carotid impulses w/o bruits; no thyromegaly or nodules palpated; no lymphadenopathy.  RESP  Clear  P & A; w/o, wheezes/ rales/ or rhonchi.no accessory muscle use, no dullness to percussion  CARD:  RRR, no m/r/g  , no peripheral edema, pulses intact, no cyanosis or clubbing.  GI:   Soft & nt; nml bowel sounds; no organomegaly or masses detected.  Musco: Warm bil, no deformities or joint swelling noted.   Neuro: alert, no focal deficits noted.    Skin: Warm, no lesions or rashes        Assessment & Plan:

## 2015-02-21 NOTE — Assessment & Plan Note (Signed)
  Continue on CPAP At bedtime  .  Work on weight loss.  Follow up in 6 weeks with Dr. Halford Chessman.  Please contact office for sooner follow up if symptoms do not improve or worsen or seek emergency care

## 2015-02-21 NOTE — Patient Instructions (Addendum)
Continue prednisone 30 mg daily for 2 weeks, 20 mg daily for 2 weeks, then 10 mg daily and hold at this dose Continue to work on not smoking. May use Nicotrol inhaler to help with stopping smoking Discuss with your family doctor that lisinopril may need to be changed as it may be making her cough worse. Continue on CPAP At bedtime  .  Work on weight loss.  Follow up in 6 weeks with Dr. Halford Chessman.  Please contact office for sooner follow up if symptoms do not improve or worsen or seek emergency care

## 2015-02-21 NOTE — Assessment & Plan Note (Signed)
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 May use Nicotrol inhaler to help with stopping smoking Discuss with your family doctor that lisinopril may need to be changed as it may be making her cough worse.  Follow up in 6 weeks with Dr. Halford Chessman.  Please contact office for sooner follow up if symptoms do not improve or worsen or seek emergency care

## 2015-02-21 NOTE — Telephone Encounter (Signed)
Okay to double book with me or follow up with Tammy.

## 2015-02-21 NOTE — Assessment & Plan Note (Signed)
Smoking cessation  Nicotrol inhaler As needed

## 2015-02-21 NOTE — Telephone Encounter (Signed)
Called and spoke to pt. Informed him of the recs per VS. Pt requested to be seen by TP. Appt made with TP on 04/11/2014. Pt verbalized understanding and denied any further questions or concerns at this time.

## 2015-02-21 NOTE — Telephone Encounter (Signed)
Pt saw TP today. Was told to follow up with Dr. Halford Chessman in 6 weeks. VS does not have any availability until February.  VS - do we double book your schedule or have the pt follow up with TP?

## 2015-03-06 ENCOUNTER — Other Ambulatory Visit: Payer: Self-pay | Admitting: Family Medicine

## 2015-03-07 ENCOUNTER — Telehealth (HOSPITAL_COMMUNITY): Payer: Self-pay | Admitting: *Deleted

## 2015-03-07 NOTE — Telephone Encounter (Addendum)
Patient wife called in stating patient has been having visual changes over the last several days and is concerned is being caused by amiodarone.  He had an eye exam within the last month with prescription changes and new glasses without issue. But over last several days he has been having difficulty seeing wording on TV and at night lights look like starburst.  Encouraged her to call and speak with eye doctor and see what their suggestions are and I will call her back once I discuss with [name] NP.She also reports his ankles are swelling and he has increased his lasix to twice daily while they are swollen - patient is on vacation until Friday.   Patient has appointment on Friday for repeat eye exam - instructed wife to call once exam completed to see what eye doctor recommended regarding amiodarone. Patient wife verbalized understanding.

## 2015-03-11 ENCOUNTER — Emergency Department (HOSPITAL_COMMUNITY)
Admission: EM | Admit: 2015-03-11 | Discharge: 2015-03-11 | Disposition: A | Discharge status: Home / Self Care | Payer: Federal, State, Local not specified - PPO | Attending: Emergency Medicine | Admitting: Emergency Medicine

## 2015-03-11 ENCOUNTER — Telehealth: Payer: Self-pay | Admitting: Family Medicine

## 2015-03-11 ENCOUNTER — Encounter (HOSPITAL_COMMUNITY): Payer: Self-pay

## 2015-03-11 ENCOUNTER — Telehealth (HOSPITAL_COMMUNITY): Payer: Self-pay | Admitting: *Deleted

## 2015-03-11 DIAGNOSIS — Z87438 Personal history of other diseases of male genital organs: Secondary | ICD-10-CM | POA: Diagnosis not present

## 2015-03-11 DIAGNOSIS — R2241 Localized swelling, mass and lump, right lower limb: Secondary | ICD-10-CM | POA: Insufficient documentation

## 2015-03-11 DIAGNOSIS — Z8619 Personal history of other infectious and parasitic diseases: Secondary | ICD-10-CM | POA: Insufficient documentation

## 2015-03-11 DIAGNOSIS — I10 Essential (primary) hypertension: Secondary | ICD-10-CM | POA: Diagnosis not present

## 2015-03-11 DIAGNOSIS — E785 Hyperlipidemia, unspecified: Secondary | ICD-10-CM | POA: Diagnosis not present

## 2015-03-11 DIAGNOSIS — T380X5A Adverse effect of glucocorticoids and synthetic analogues, initial encounter: Secondary | ICD-10-CM | POA: Insufficient documentation

## 2015-03-11 DIAGNOSIS — Z8719 Personal history of other diseases of the digestive system: Secondary | ICD-10-CM | POA: Diagnosis not present

## 2015-03-11 DIAGNOSIS — H2513 Age-related nuclear cataract, bilateral: Secondary | ICD-10-CM | POA: Diagnosis not present

## 2015-03-11 DIAGNOSIS — Z8739 Personal history of other diseases of the musculoskeletal system and connective tissue: Secondary | ICD-10-CM | POA: Insufficient documentation

## 2015-03-11 DIAGNOSIS — F17210 Nicotine dependence, cigarettes, uncomplicated: Secondary | ICD-10-CM | POA: Insufficient documentation

## 2015-03-11 DIAGNOSIS — E0965 Drug or chemical induced diabetes mellitus with hyperglycemia: Secondary | ICD-10-CM | POA: Diagnosis not present

## 2015-03-11 DIAGNOSIS — Z7902 Long term (current) use of antithrombotics/antiplatelets: Secondary | ICD-10-CM | POA: Diagnosis not present

## 2015-03-11 DIAGNOSIS — Z9981 Dependence on supplemental oxygen: Secondary | ICD-10-CM | POA: Insufficient documentation

## 2015-03-11 DIAGNOSIS — R2242 Localized swelling, mass and lump, left lower limb: Secondary | ICD-10-CM | POA: Insufficient documentation

## 2015-03-11 DIAGNOSIS — Z79899 Other long term (current) drug therapy: Secondary | ICD-10-CM | POA: Diagnosis not present

## 2015-03-11 DIAGNOSIS — Z8546 Personal history of malignant neoplasm of prostate: Secondary | ICD-10-CM | POA: Insufficient documentation

## 2015-03-11 DIAGNOSIS — Z7952 Long term (current) use of systemic steroids: Secondary | ICD-10-CM | POA: Insufficient documentation

## 2015-03-11 DIAGNOSIS — Z86010 Personal history of colonic polyps: Secondary | ICD-10-CM | POA: Insufficient documentation

## 2015-03-11 DIAGNOSIS — R739 Hyperglycemia, unspecified: Secondary | ICD-10-CM

## 2015-03-11 DIAGNOSIS — G4733 Obstructive sleep apnea (adult) (pediatric): Secondary | ICD-10-CM | POA: Insufficient documentation

## 2015-03-11 LAB — CBC
HCT: 41.6 % (ref 39.0–52.0)
HEMOGLOBIN: 14.8 g/dL (ref 13.0–17.0)
MCH: 34.4 pg — ABNORMAL HIGH (ref 26.0–34.0)
MCHC: 35.6 g/dL (ref 30.0–36.0)
MCV: 96.7 fL (ref 78.0–100.0)
PLATELETS: 156 10*3/uL (ref 150–400)
RBC: 4.3 MIL/uL (ref 4.22–5.81)
RDW: 12.3 % (ref 11.5–15.5)
WBC: 7.4 10*3/uL (ref 4.0–10.5)

## 2015-03-11 LAB — BASIC METABOLIC PANEL
ANION GAP: 14 (ref 5–15)
BUN: 25 mg/dL — ABNORMAL HIGH (ref 6–20)
CALCIUM: 9 mg/dL (ref 8.9–10.3)
CO2: 29 mmol/L (ref 22–32)
CREATININE: 1.54 mg/dL — AB (ref 0.61–1.24)
Chloride: 90 mmol/L — ABNORMAL LOW (ref 101–111)
GFR calc non Af Amer: 44 mL/min — ABNORMAL LOW (ref >60)
GFR, EST AFRICAN AMERICAN: 51 mL/min — AB (ref >60)
Glucose, Bld: 428 mg/dL — ABNORMAL HIGH (ref 65–99)
Potassium: 3.8 mmol/L (ref 3.5–5.1)
SODIUM: 133 mmol/L — AB (ref 135–145)

## 2015-03-11 LAB — URINALYSIS, ROUTINE W REFLEX MICROSCOPIC
Bilirubin Urine: NEGATIVE
Glucose, UA: >1000 mg/dL — AB
Ketones, ur: 15 mg/dL — AB
LEUKOCYTES UA: NEGATIVE
NITRITE: NEGATIVE
PROTEIN: NEGATIVE mg/dL
Specific Gravity, Urine: 1.021 (ref 1.005–1.030)
pH: 5.5 (ref 5.0–8.0)

## 2015-03-11 LAB — CBG MONITORING, ED
GLUCOSE-CAPILLARY: 399 mg/dL — AB (ref 65–99)
Glucose-Capillary: 419 mg/dL — ABNORMAL HIGH (ref 65–99)
Glucose-Capillary: 369 mg/dL — ABNORMAL HIGH (ref 65–99)

## 2015-03-11 LAB — URINE MICROSCOPIC-ADD ON
Bacteria, UA: NONE SEEN
SQUAMOUS EPITHELIAL / LPF: NONE SEEN

## 2015-03-11 MED ORDER — SODIUM CHLORIDE 0.9 % IV SOLN: 1000.0000 mL | INTRAVENOUS | Status: DC

## 2015-03-11 MED ORDER — SODIUM CHLORIDE 0.9 % IV BOLUS (SEPSIS)
500.0000 mL | Freq: Once | INTRAVENOUS | Status: AC
Start: 2015-03-11 — End: 2015-03-11
  Administered 2015-03-11: 500 mL via INTRAVENOUS

## 2015-03-11 MED ORDER — GLIPIZIDE 5 MG PO TABS
5.0000 mg | ORAL_TABLET | Freq: Every day | ORAL | Status: DC
  Administered 2015-03-11: 5 mg via ORAL
  Filled 2015-03-11 (×2): qty 1

## 2015-03-11 MED ORDER — GLUCOSE BLOOD VI STRP: ORAL_STRIP | Status: DC

## 2015-03-11 MED ORDER — GLIPIZIDE 5 MG PO TABS: 5.0000 mg | ORAL_TABLET | Freq: Two times a day (BID) | ORAL | Status: DC

## 2015-03-11 MED ORDER — SODIUM CHLORIDE 0.9 % IV SOLN: 1000.0000 mL | Freq: Once | INTRAVENOUS | Status: DC

## 2015-03-11 MED ORDER — GLIPIZIDE 5 MG PO TABS
5.0000 mg | ORAL_TABLET | Freq: Every day | ORAL | Status: DC
  Filled 2015-03-11: qty 1

## 2015-03-11 NOTE — Telephone Encounter (Signed)
Mrs. Blaisdell called saying Darol was seen in the ER today due to elevated blood sugar levels and was advised to have a follow-up appt on Monday morning. Dr. Erick Blinks schedule is full so per [place], I needed to send you a message to see if he wants to work him in. Please give he or his wife, [name] a call.  Pt's ph# [phone] Thank you.

## 2015-03-11 NOTE — ED Provider Notes (Signed)
CSN: [id number]     Arrival date & time 03/11/15  1023 History   First MD Initiated Contact with Patient 03/11/15 1114     Chief Complaint  Patient presents with  . Hyperglycemia     (Consider location/radiation/quality/duration/timing/severity/associated sxs/prior Treatment) HPI Patient has been on a prednisone course for 4 weeks. He is now supposed to continue with a 2 week course of prednisone at 30 mg daily. Patient noticed in the past several days that he's been having blurred vision, urinating frequently and fatigue and weakness. He saw his ophthalmologist today and was identified to have a blood sugar of 400. Patient reports he was a pre-diabetic but has never required treatment for diabetes. Past Medical History  Diagnosis Date  . Hypertension   . Hyperlipidemia   . Gout   . Tinea versicolor   . OSA on CPAP     per study 08-03-2004  Severe OSA  . Allergic rhinitis   . Prostate cancer [facility])     Dx  2014--  stage T1c, Gleason 3+3=6, PSA 6.67--  Active surveillance  . Bilateral hydrocele   . History of colon polyps     tubular adenoma 2013  . Sigmoid diverticulosis   . Cigarette smoker two packs a day or less    Past Surgical History  Procedure Laterality Date  . Colonoscopy  last one 06-07-2011  . Prostate biopsy  05/15/12    Clinically both Lobes  . Tonsillectomy  as child  . Laminectomy and microdiscectomy lumbar spine  12-23-2003    Left L5 -- S1  . Laparoscopic bilateral inguinal hernia repair/  umbilical hernia repair with mesh/  aspiration left hydrocele  07-11-2012  . Transthoracic echocardiogram  05/01/2014    mild LVH; EF 50-55% (cannot measure diastolic Fxn with Afib), Mod LA Dilation, mild MR  . Tee without cardioversion N/A 05/07/2014    Procedure: TRANSESOPHAGEAL ECHOCARDIOGRAM (TEE);  Surgeon: [name], MD;  Location: [facility] ENDOSCOPY;  Service: Cardiovascular;  Laterality: N/A;  . Cardioversion N/A 05/07/2014    Procedure: CARDIOVERSION;  Surgeon: [name], MD;  Location: [facility];  Service: Cardiovascular;  Laterality: N/A;  . Nm myoview ltd  05/18/2014    Low risk study. Normal perfusion: No ischemia or infarction. Mild LV dysfunction - 46% (does not correlate with echocardiographic EF of 50-55%)  . Cardioversion N/A 09/09/2014    Procedure: CARDIOVERSION;  Surgeon: Lelon Perla, MD;  Location: [facility] ENDOSCOPY;  Service: Cardiovascular;  Laterality: N/A;   Family History  Problem Relation Age of Onset  . Stroke      Unknown   . Colon cancer Neg Hx   . Esophageal cancer Neg Hx   . Rectal cancer Neg Hx   . Stomach cancer Neg Hx   . Breast cancer Mother   . Ovarian cancer Sister    Social History  Substance Use Topics  . Smoking status: Current Every Day Smoker -- 1.00 packs/day for 55 years    Types: Cigarettes  . Smokeless tobacco: Never Used  . Alcohol Use: 3.0 oz/week    6 Standard drinks or equivalent per week    Review of Systems  10 Systems reviewed and are negative for acute change except as noted in the HPI.   Allergies  Review of patient's allergies indicates no known allergies.  Home Medications   Prior to Admission medications   Medication Sig Start Date End Date Taking? Authorizing Provider  acetaminophen (TYLENOL) 500 MG tablet Take 500 mg by  mouth every 6 (six) hours as needed for mild pain or moderate pain.   Yes Historical Provider, MD  amiodarone (PACERONE) 200 MG tablet Take 1 tablet (200 mg total) by mouth daily. 11/29/14  Yes Sherran Needs, NP  atorvastatin (LIPITOR) 10 MG tablet Take 1 tablet (10 mg total) by mouth every evening. 05/08/14  Yes Modena Jansky, MD  diltiazem (CARDIZEM CD) 360 MG 24 hr capsule TAKE 1 CAPSULE DAILY 03/08/15  Yes [name], MD  ELIQUIS 5 MG TABS tablet TAKE 1 TABLET TWICE A DAY 11/18/14  Yes [name], MD  furosemide (LASIX) 40 MG tablet TAKE 1 TABLET BY MOUTH DAILY Patient taking differently: TAKE 1 TABLET BY MOUTH TWICE DAILY. 06/02/14  Yes [name], MD  lisinopril (PRINIVIL,ZESTRIL) 20 MG tablet TAKE 1 TABLET (20 MG TOTAL) BY MOUTH DAILY. 11/15/14  Yes [name], MD  metoprolol (LOPRESSOR) 25 MG tablet Take 1 tablet (25 mg total) by mouth 2 (two) times daily. 12/27/14  Yes Sherran Needs, NP  nicotine (NICOTROL) 10 MG inhaler Inhale 1 cartridge (1 continuous puffing total) into the lungs as needed for smoking cessation. 02/21/15  Yes Tammy S Parrett, NP  predniSONE (DELTASONE) 20 MG tablet Take 30 mg daily for 2 weeks, 20 mg daily for 2 weeks, then 10 mg daily and hold at this dose 02/21/15  Yes Tammy S Parrett, NP  tamsulosin (FLOMAX) 0.4 MG CAPS capsule Take 0.4 mg by mouth daily. 02/14/15  Yes Historical Provider, MD  triamcinolone cream (KENALOG) 0.1 % Apply 1 application topically 2 (two) times daily as needed (for rash). 05/08/14  Yes Modena Jansky, MD  fluticasone (FLONASE) 50 MCG/ACT nasal spray Place 2 sprays into both nostrils daily as needed for allergies or rhinitis. Patient not taking: Reported on 02/21/2015 05/08/14   Modena Jansky, MD  glipiZIDE (GLUCOTROL) 5 MG tablet Take 1 tablet (5 mg total) by mouth 2 (two) times daily before a meal. 03/11/15   Charlesetta Shanks, MD  glucose blood test strip Use as instructed 03/11/15   Charlesetta Shanks, MD   BP 128/87 mmHg  Pulse 84  Resp 16  SpO2 98% Physical Exam  Constitutional: He is oriented to person, place, and time. He appears well-developed and well-nourished.  HENT:  Head: Normocephalic and atraumatic.  Eyes: EOM are normal. Pupils are equal, round, and reactive to light.  Neck: Neck supple.  Cardiovascular: Normal rate, regular rhythm, normal heart sounds and intact distal pulses.   Pulmonary/Chest: Effort normal and breath sounds normal.  Abdominal: Soft. Bowel sounds are normal. He exhibits no distension. There is no tenderness.  Musculoskeletal: Normal range of motion. He exhibits edema.  Patient has 1+ pitting edema bilateral ankles. Calves are soft and  nontender.  Neurological: He is alert and oriented to person, place, and time. He has normal strength. Coordination normal. GCS eye subscore is 4. GCS verbal subscore is 5. GCS motor subscore is 6.  Skin: Skin is warm, dry and intact.  Skin is warm and dry. Face is very ruddy.  Psychiatric: He has a normal mood and affect.    ED Course  Procedures (including critical care time) Labs Review Labs Reviewed  BASIC METABOLIC PANEL - Abnormal; Notable for the following:    Sodium 133 (*)    Chloride 90 (*)    Glucose, Bld 428 (*)    BUN 25 (*)    Creatinine, Ser 1.54 (*)    GFR calc non Af  Amer 44 (*)    GFR calc Af Amer 51 (*)    All other components within normal limits  CBC - Abnormal; Notable for the following:    MCH 34.4 (*)    All other components within normal limits  URINALYSIS, ROUTINE W REFLEX MICROSCOPIC (NOT AT [facility]) - Abnormal; Notable for the following:    Glucose, UA >1000 (*)    Hgb urine dipstick SMALL (*)    Ketones, ur 15 (*)    All other components within normal limits  URINE MICROSCOPIC-ADD ON - Abnormal; Notable for the following:    Casts HYALINE CASTS (*)    All other components within normal limits  CBG MONITORING, ED - Abnormal; Notable for the following:    Glucose-Capillary 419 (*)    All other components within normal limits  CBG MONITORING, ED - Abnormal; Notable for the following:    Glucose-Capillary 399 (*)    All other components within normal limits  HEMOGLOBIN A1C  CBG MONITORING, ED  CBG MONITORING, ED    Imaging Review No results found. I have personally reviewed and evaluated these images and lab results as part of my medical decision-making.   EKG Interpretation None     Consult: Patient's case was reviewed with Dr. Doyle Askew. At this time she recommends starting the patient glipizide 5mg  BID. We will taper his prednisone to 30 mg for 3 days and 20 mg for 3 days with a follow-up with his primary care provider to continue the tapering as  tolerated. MDM   Final diagnoses:  Steroid-induced hyperglycemia   Patient is nontoxic and alert. Vital signs are stable. He does not have anion gap. I have discussed the management with Dr. Doyle Askew. At this point she suggests initiating glipizide 5 mg twice daily and titrating down the patient's steroid dose. He has had a very protracted course of steroids and is at 4 weeks at this point. He is at 30 mg daily. At this point he will go down to 20 mg daily and follow-up with his family physician on Monday. He is counseled on signs and symptoms for which to return.    Charlesetta Shanks, MD 03/11/15 [id number]

## 2015-03-11 NOTE — Telephone Encounter (Signed)
Patient went to ER.  FYI.

## 2015-03-11 NOTE — ED Notes (Signed)
Pt has had blurred vision since a week ago.  Pt went to [facility].  CBG checked and over 400.  Pt has no hx of DM but was being monitored.  Pt has been on prednisone since October for lung inflammation.  Pt states feels weaker than normal and increase in urination.

## 2015-03-11 NOTE — Telephone Encounter (Signed)
Patient Name: YARIN BINGER  DOB: 11/19/1945    Initial Comment Caller states husband has been having vision problems, saw ophthalmologist today, bs is 415, on prednisone. Weak, frequent urination.   Nurse Assessment  Nurse: Raphael Gibney, RN, Vanita Ingles Date/Time (Eastern Time): 03/11/2015 9:58:04 AM  Confirm and document reason for call. If symptomatic, describe symptoms. ---Caller states spouse saw ophthalomogist today. His blood sugar was 415 at the opthalmologist office. He is taking prednisone for inflammation in his lungs. He is weak and has no energy. He is having frequent urination. His ankles have been swelling. his cough sounds worse.  Has the patient traveled out of the country within the last 30 days? ---No  Does the patient have any new or worsening symptoms? ---Yes  Will a triage be completed? ---Yes  Related visit to physician within the last 2 weeks? ---No  Does the PT have any chronic conditions? (i.e. diabetes, asthma, etc.) ---Yes  List chronic conditions. ---prediabetes; bronchitis; atrial fib, HTN  Is this a behavioral health or substance abuse call? ---No     Guidelines    Guideline Title Affirmed Question Affirmed Notes  Diabetes - High Blood Sugar Blood glucose > 400 mg/dl (22 mmol/l)    Final Disposition User   Call PCP Now Raphael Gibney, RN, Vera    Comments  No appts available at [facility] or [facility]. Pt will go to the ER at [facility].   Referrals  [facility] - ED   Disagree/Comply: Comply

## 2015-03-11 NOTE — Discharge Instructions (Signed)
Hyperglycemia  Start Glipizide as prescribed. Take 20 mg of prednisone daily for the next 6 days. Discuss continued does decrease with your family doctor. You need a recheck on Monday. If your developing any concerning symptoms you are to return to the emergency department over the weekend.   Hyperglycemia occurs when the glucose (sugar) in your blood is too high. Hyperglycemia can happen for many reasons, but it most often happens to people who do not know they have diabetes or are not managing their diabetes properly.  CAUSES  Whether you have diabetes or not, there are other causes of hyperglycemia. Hyperglycemia can occur when you have diabetes, but it can also occur in other situations that you might not be as aware of, such as: Diabetes  If you have diabetes and are having problems controlling your blood glucose, hyperglycemia could occur because of some of the following reasons:  Not following your meal plan.  Not taking your diabetes medications or not taking it properly.  Exercising less or doing less activity than you normally do.  Being sick. Pre-diabetes  This cannot be ignored. Before people develop Type 2 diabetes, they almost always have "pre-diabetes." This is when your blood glucose levels are higher than normal, but not yet high enough to be diagnosed as diabetes. Research has shown that some long-term damage to the body, especially the heart and circulatory system, may already be occurring during pre-diabetes. If you take action to manage your blood glucose when you have pre-diabetes, you may delay or prevent Type 2 diabetes from developing. Stress  If you have diabetes, you may be "diet" controlled or on oral medications or insulin to control your diabetes. However, you may find that your blood glucose is higher than usual in the hospital whether you have diabetes or not. This is often referred to as "stress hyperglycemia." Stress can elevate your blood glucose. This  happens because of hormones put out by the body during times of stress. If stress has been the cause of your high blood glucose, it can be followed regularly by your caregiver. That way he/she can make sure your hyperglycemia does not continue to get worse or progress to diabetes. Steroids  Steroids are medications that act on the infection fighting system (immune system) to block inflammation or infection. One side effect can be a rise in blood glucose. Most people can produce enough extra insulin to allow for this rise, but for those who cannot, steroids make blood glucose levels go even higher. It is not unusual for steroid treatments to "uncover" diabetes that is developing. It is not always possible to determine if the hyperglycemia will go away after the steroids are stopped. A special blood test called an A1c is sometimes done to determine if your blood glucose was elevated before the steroids were started. SYMPTOMS  Thirsty.  Frequent urination.  Dry mouth.  Blurred vision.  Tired or fatigue.  Weakness.  Sleepy.  Tingling in feet or leg. DIAGNOSIS  Diagnosis is made by monitoring blood glucose in one or all of the following ways:  A1c test. This is a chemical found in your blood.  Fingerstick blood glucose monitoring.  Laboratory results. TREATMENT  First, knowing the cause of the hyperglycemia is important before the hyperglycemia can be treated. Treatment may include, but is not be limited to:  Education.  Change or adjustment in medications.  Change or adjustment in meal plan.  Treatment for an illness, infection, etc.  More frequent blood glucose monitoring.  Change in exercise plan.  Decreasing or stopping steroids.  Lifestyle changes. HOME CARE INSTRUCTIONS   Test your blood glucose as directed.  Exercise regularly. Your caregiver will give you instructions about exercise. Pre-diabetes or diabetes which comes on with stress is helped by  exercising.  Eat wholesome, balanced meals. Eat often and at regular, fixed times. Your caregiver or nutritionist will give you a meal plan to guide your sugar intake.  Being at an ideal weight is important. If needed, losing as little as 10 to 15 pounds may help improve blood glucose levels. SEEK MEDICAL CARE IF:   You have questions about medicine, activity, or diet.  You continue to have symptoms (problems such as increased thirst, urination, or weight gain). SEEK IMMEDIATE MEDICAL CARE IF:   You are vomiting or have diarrhea.  Your breath smells fruity.  You are breathing faster or slower.  You are very sleepy or incoherent.  You have numbness, tingling, or pain in your feet or hands.  You have chest pain.  Your symptoms get worse even though you have been following your caregiver's orders.  If you have any other questions or concerns.   This information is not intended to replace advice given to you by your health care provider. Make sure you discuss any questions you have with your health care provider.   Document Released: 09/19/2000 Document Revised: 06/18/2011 Document Reviewed: 11/30/2014 Elsevier Interactive Patient Education 2016 Elsevier In   Blood Glucose Monitoring, Adult Monitoring your blood glucose (also know as blood sugar) helps you to manage your diabetes. It also helps you and your health care provider monitor your diabetes and determine how well your treatment plan is working. WHY SHOULD YOU MONITOR YOUR BLOOD GLUCOSE?  It can help you understand how food, exercise, and medicine affect your blood glucose.  It allows you to know what your blood glucose is at any given moment. You can quickly tell if you are having low blood glucose (hypoglycemia) or high blood glucose (hyperglycemia).  It can help you and your health care provider know how to adjust your medicines.  It can help you understand how to manage an illness or adjust medicine for  exercise. WHEN SHOULD YOU TEST? Your health care provider will help you decide how often you should check your blood glucose. This may depend on the type of diabetes you have, your diabetes control, or the types of medicines you are taking. Be sure to write down all of your blood glucose readings so that this information can be reviewed with your health care provider. See below for examples of testing times that your health care provider may suggest. Type 1 Diabetes  Test at least 2 times per day if your diabetes is well controlled, if you are using an insulin pump, or if you perform multiple daily injections.  If your diabetes is not well controlled or if you are sick, you may need to test more often.  It is a good idea to also test:  Before every insulin injection.  Before and after exercise.  Between meals and 2 hours after a meal.  Occasionally between 2:00 a.m. and 3:00 a.m. Type 2 Diabetes  If you are taking insulin, test at least 2 times per day. However, it is best to test before every insulin injection.  If you take medicines by mouth (orally), test 2 times a day.  If you are on a controlled diet, test once a day.  If your diabetes is not well  controlled or if you are sick, you may need to monitor more often. HOW TO MONITOR YOUR BLOOD GLUCOSE Supplies Needed  Blood glucose meter.  Test strips for your meter. Each meter has its own strips. You must use the strips that go with your own meter.  A pricking needle (lancet).  A device that holds the lancet (lancing device).  A journal or log book to write down your results. Procedure  Wash your hands with soap and water. Alcohol is not preferred.  Prick the side of your finger (not the tip) with the lancet.  Gently milk the finger until a small drop of blood appears.  Follow the instructions that come with your meter for inserting the test strip, applying blood to the strip, and using your blood glucose meter. Other  Areas to Get Blood for Testing Some meters allow you to use other areas of your body (other than your finger) to test your blood. These areas are called alternative sites. The most common alternative sites are:  The forearm.  The thigh.  The back area of the lower leg.  The palm of the hand. The blood flow in these areas is slower. Therefore, the blood glucose values you get may be delayed, and the numbers are different from what you would get from your fingers. Do not use alternative sites if you think you are having hypoglycemia. Your reading will not be accurate. Always use a finger if you are having hypoglycemia. Also, if you cannot feel your lows (hypoglycemia unawareness), always use your fingers for your blood glucose checks. ADDITIONAL TIPS FOR GLUCOSE MONITORING  Do not reuse lancets.  Always carry your supplies with you.  All blood glucose meters have a 24-hour "hotline" number to call if you have questions or need help.  Adjust (calibrate) your blood glucose meter with a control solution after finishing a few boxes of strips.  Blood Glucose Monitoring, Adult Monitoring your blood glucose (also know as blood sugar) helps you to manage your diabetes. It also helps you and your health care provider monitor your diabetes and determine how well your treatment plan is working. WHY SHOULD YOU MONITOR YOUR BLOOD GLUCOSE?  It can help you understand how food, exercise, and medicine affect your blood glucose.  It allows you to know what your blood glucose is at any given moment. You can quickly tell if you are having low blood glucose (hypoglycemia) or high blood glucose (hyperglycemia).  It can help you and your health care provider know how to adjust your medicines.  It can help you understand how to manage an illness or adjust medicine for exercise. WHEN SHOULD YOU TEST? Your health care provider will help you decide how often you should check your blood glucose. This may depend  on the type of diabetes you have, your diabetes control, or the types of medicines you are taking. Be sure to write down all of your blood glucose readings so that this information can be reviewed with your health care provider. See below for examples of testing times that your health care provider may suggest. Type 1 Diabetes  Test at least 2 times per day if your diabetes is well controlled, if you are using an insulin pump, or if you perform multiple daily injections.  If your diabetes is not well controlled or if you are sick, you may need to test more often.  It is a good idea to also test:  Before every insulin injection.  Before and after  exercise.  Between meals and 2 hours after a meal.  Occasionally between 2:00 a.m. and 3:00 a.m. Type 2 Diabetes  If you are taking insulin, test at least 2 times per day. However, it is best to test before every insulin injection.  If you take medicines by mouth (orally), test 2 times a day.  If you are on a controlled diet, test once a day.  If your diabetes is not well controlled or if you are sick, you may need to monitor more often. HOW TO MONITOR YOUR BLOOD GLUCOSE Supplies Needed  Blood glucose meter.  Test strips for your meter. Each meter has its own strips. You must use the strips that go with your own meter.  A pricking needle (lancet).  A device that holds the lancet (lancing device).  A journal or log book to write down your results. Procedure  Wash your hands with soap and water. Alcohol is not preferred.  Prick the side of your finger (not the tip) with the lancet.  Gently milk the finger until a small drop of blood appears.  Follow the instructions that come with your meter for inserting the test strip, applying blood to the strip, and using your blood glucose meter. Other Areas to Get Blood for Testing Some meters allow you to use other areas of your body (other than your finger) to test your blood. These areas  are called alternative sites. The most common alternative sites are:  The forearm.  The thigh.  The back area of the lower leg.  The palm of the hand. The blood flow in these areas is slower. Therefore, the blood glucose values you get may be delayed, and the numbers are different from what you would get from your fingers. Do not use alternative sites if you think you are having hypoglycemia. Your reading will not be accurate. Always use a finger if you are having hypoglycemia. Also, if you cannot feel your lows (hypoglycemia unawareness), always use your fingers for your blood glucose checks. ADDITIONAL TIPS FOR GLUCOSE MONITORING  Do not reuse lancets.  Always carry your supplies with you.  All blood glucose meters have a 24-hour "hotline" number to call if you have questions or need help.  Adjust (calibrate) your blood glucose meter with a control solution after finishing a few boxes of strips. BLOOD GLUCOSE RECORD KEEPING It is a good idea to keep a daily record or log of your blood glucose readings. Most glucose meters, if not all, keep your glucose records stored in the meter. Some meters come with the ability to download your records to your home computer. Keeping a record of your blood glucose readings is especially helpful if you are wanting to look for patterns. Make notes to go along with the blood glucose readings because you might forget what happened at that exact time. Keeping good records helps you and your health care provider to work together to achieve good diabetes management.    This information is not intended to replace advice given to you by your health care provider. Make sure you discuss any questions you have with your health care provider.   Document Released: 03/29/2003 Document Revised: 04/16/2014 Document Reviewed: 08/18/2012 Elsevier Interactive Patient Education [employer].

## 2015-03-11 NOTE — Telephone Encounter (Signed)
Pt was walk-in to clinic stating he had his eyes examined today regarding vision changes and his fasting blood sugar was 495.  Wanting to know what he should do.  Instructed patient he needs to be seen by primary physician today. Patient and wife verbalized they would be going to [place] to see his primary physician right now as walk-in.

## 2015-03-12 LAB — HEMOGLOBIN A1C
HEMOGLOBIN A1C: 10.9 % — AB (ref 4.8–5.6)
Mean Plasma Glucose: 266 mg/dL

## 2015-03-14 ENCOUNTER — Telehealth: Payer: Self-pay | Admitting: Pulmonary Disease

## 2015-03-14 ENCOUNTER — Ambulatory Visit (INDEPENDENT_AMBULATORY_CARE_PROVIDER_SITE_OTHER): Payer: Federal, State, Local not specified - PPO | Admitting: Adult Health

## 2015-03-14 ENCOUNTER — Encounter: Payer: Self-pay | Admitting: Adult Health

## 2015-03-14 VITALS — BP 102/60 | Temp 98.4°F | Ht 71.0 in | Wt 222.1 lb

## 2015-03-14 DIAGNOSIS — R739 Hyperglycemia, unspecified: Secondary | ICD-10-CM | POA: Diagnosis not present

## 2015-03-14 DIAGNOSIS — I959 Hypotension, unspecified: Secondary | ICD-10-CM | POA: Diagnosis not present

## 2015-03-14 LAB — POCT CBG (FASTING - GLUCOSE)-MANUAL ENTRY: GLUCOSE FASTING, POC: 351 mg/dL — AB (ref 70–99)

## 2015-03-14 MED ORDER — METFORMIN HCL 500 MG PO TABS: 500.0000 mg | ORAL_TABLET | Freq: Two times a day (BID) | ORAL | Status: DC

## 2015-03-14 NOTE — Telephone Encounter (Signed)
Take Prednisone 20mg  daily for 5 days then go to 10mg  daily and hold at this dose  Make sure to keep ov with Dr. Halford Chessman  As recommended .  Quit smoking is key .  Please contact office for sooner follow up if symptoms do not improve or worsen or seek emergency care

## 2015-03-14 NOTE — Progress Notes (Addendum)
Subjective:    Patient ID: Ronald [name], male    DOB: 1945/05/26, 69 y.o.   MRN: [id number]  HPI  69 year old male, patient of Dr. Elease Hashimoto who presents to the office today for hyperglycemia and hypotension. He was seen in the ER on 03/11/2015. Per ER note:  Patient has been on a prednisone course for 4 weeks. He is now supposed to continue with a 2 week course of prednisone at 30 mg daily. Patient noticed in the past several days that he's been having blurred vision, urinating frequently and fatigue and weakness. He saw his ophthalmologist today and was identified to have a blood sugar of 400. Patient reports he was a pre-diabetic but has never required treatment for diabetes.  He was started on Glipizide 5 mg BID. They also recommended that he taper his prednisone to 30 mg x 3 days, 20 mg x 3 days, and follow up with PCP.   Larz endorses that he continues to have blurred vision "star burst " as well as feeling fatigued and worn out.   His blood sugars continue to be elevated, this morning is was 296. In the office today he is 351.   He also endorses that his blood pressures have been low. Today in the office 102/60 and 110/70. He is taking his medications as directed  Review of Systems  Constitutional: Positive for activity change and fatigue.  HENT: Negative.   Respiratory: Negative for cough and shortness of breath.   Cardiovascular: Negative.   Gastrointestinal: Negative.   Genitourinary: Negative.   Skin: Negative.   Neurological: Positive for dizziness, weakness and light-headedness. Negative for tremors, seizures, syncope, speech difficulty, numbness and headaches.  Psychiatric/Behavioral: Negative.   All other systems reviewed and are negative.  Past Medical History  Diagnosis Date  . Hypertension   . Hyperlipidemia   . Gout   . Tinea versicolor   . OSA on CPAP     per study 08-03-2004  Severe OSA  . Allergic rhinitis   . Prostate cancer [facility])     Dx  2014--   stage T1c, Gleason 3+3=6, PSA 6.67--  Active surveillance  . Bilateral hydrocele   . History of colon polyps     tubular adenoma 2013  . Sigmoid diverticulosis   . Cigarette smoker two packs a day or less     Social History   Social History  . Marital Status: Married    Spouse Name: N/A  . Number of Children: N/A  . Years of Education: N/A   Occupational History  . Sales    Social History Main Topics  . Smoking status: Current Every Day Smoker -- 1.00 packs/day for 55 years    Types: Cigarettes  . Smokeless tobacco: Never Used  . Alcohol Use: 3.0 oz/week    6 Standard drinks or equivalent per week  . Drug Use: No  . Sexual Activity: Not on file   Other Topics Concern  . Not on file   Social History Narrative   Married 25 years   2 children but not with this wife    Past Surgical History  Procedure Laterality Date  . Colonoscopy  last one 06-07-2011  . Prostate biopsy  05/15/12    Clinically both Lobes  . Tonsillectomy  as child  . Laminectomy and microdiscectomy lumbar spine  12-23-2003    Left L5 -- S1  . Laparoscopic bilateral inguinal hernia repair/  umbilical hernia repair with mesh/  aspiration left hydrocele  07-11-2012  . Transthoracic echocardiogram  05/01/2014    mild LVH; EF 50-55% (cannot measure diastolic Fxn with Afib), Mod LA Dilation, mild MR  . Tee without cardioversion N/A 05/07/2014    Procedure: TRANSESOPHAGEAL ECHOCARDIOGRAM (TEE);  Surgeon: [name], MD;  Location: [facility] ENDOSCOPY;  Service: Cardiovascular;  Laterality: N/A;  . Cardioversion N/A 05/07/2014    Procedure: CARDIOVERSION;  Surgeon: [name], MD;  Location: [place];  Service: Cardiovascular;  Laterality: N/A;  . Nm myoview ltd  05/18/2014    Low risk study. Normal perfusion: No ischemia or infarction. Mild LV dysfunction - 46% (does not correlate with echocardiographic EF of 50-55%)  . Cardioversion N/A 09/09/2014    Procedure: CARDIOVERSION;  Surgeon: Lelon Perla, MD;   Location: [facility] END[facility]COPY;  Service: Cardiovascular;  Laterality: N/A;    Family History  Problem Relation Age of Onset  . Stroke      Unknown   . Colon cancer Neg Hx   . Esophageal cancer Neg Hx   . Rectal cancer Neg Hx   . Stomach cancer Neg Hx   . Breast cancer Mother   . Ovarian cancer Sister     No Known Allergies  Current Outpatient Prescriptions on File Prior to Visit  Medication Sig Dispense Refill  . acetaminophen (TYLENOL) 500 MG tablet Take 500 mg by mouth every 6 (six) hours as needed for mild pain or moderate pain.    Marland Kitchen amiodarone (PACERONE) 200 MG tablet Take 1 tablet (200 mg total) by mouth daily. 30 tablet 6  . atorvastatin (LIPITOR) 10 MG tablet Take 1 tablet (10 mg total) by mouth every evening.    . diltiazem (CARDIZEM CD) 360 MG 24 hr capsule TAKE 1 CAPSULE DAILY 90 capsule 1  . ELIQUIS 5 MG TABS tablet TAKE 1 TABLET TWICE A DAY 180 tablet 1  . fluticasone (FLONASE) 50 MCG/ACT nasal spray Place 2 sprays into both nostrils daily as needed for allergies or rhinitis.    . furosemide (LASIX) 40 MG tablet TAKE 1 TABLET BY MOUTH DAILY (Patient taking differently: TAKE 1 TABLET BY MOUTH TWICE DAILY.) 30 tablet 11  . glipiZIDE (GLUCOTROL) 5 MG tablet Take 1 tablet (5 mg total) by mouth 2 (two) times daily before a meal. 60 tablet 0  . glucose blood test strip Use as instructed 100 each 0  . lisinopril (PRINIVIL,ZESTRIL) 20 MG tablet TAKE 1 TABLET (20 MG TOTAL) BY MOUTH DAILY. 90 tablet 1  . metoprolol (LOPRESSOR) 25 MG tablet Take 1 tablet (25 mg total) by mouth 2 (two) times daily.    . nicotine (NICOTROL) 10 MG inhaler Inhale 1 cartridge (1 continuous puffing total) into the lungs as needed for smoking cessation. 42 each 1  . predniSONE (DELTASONE) 20 MG tablet Take 30 mg daily for 2 weeks, 20 mg daily for 2 weeks, then 10 mg daily and hold at this dose (Patient taking differently: Take 20 mg by mouth daily with breakfast. Take 30 mg daily for 2 weeks, 20 mg daily for 2  weeks, then 10 mg daily and hold at this dose) 60 tablet 1  . tamsulosin (FLOMAX) 0.4 MG CAPS capsule Take 0.4 mg by mouth daily.  5  . triamcinolone cream (KENALOG) 0.1 % Apply 1 application topically 2 (two) times daily as needed (for rash).     No current facility-administered medications on file prior to visit.    BP 102/60 mmHg  Temp(Src) 98.4 F (36.9 C) (Oral)  Ht '5\' 11"'  (  1.803 m)  Wt 222 lb 1.6 oz (100.744 kg)  BMI 30.99 kg/m2       Objective:   Physical Exam  Constitutional: He is oriented to person, place, and time. He appears well-developed and well-nourished. He appears distressed (tired and worn out).  Cardiovascular: Normal rate, regular rhythm, normal heart sounds and intact distal pulses.  Exam reveals no gallop and no friction rub.   No murmur heard. Pulmonary/Chest: Effort normal and breath sounds normal. No respiratory distress. He has no wheezes. He has no rales. He exhibits no tenderness.  Musculoskeletal: Normal range of motion. He exhibits edema (trace bilateral lower exttremity swelling).  Neurological: He is alert and oriented to person, place, and time.  Skin: Skin is warm and dry. No rash noted. He is not diaphoretic. No erythema. No pallor.  Psychiatric: He has a normal mood and affect. His behavior is normal. Judgment and thought content normal.  Nursing note and vitals reviewed.     Assessment & Plan:  1. Hyperglycemia - From prednisone use - POCT CBG (Fasting - Glucose) - 351 - metFORMIN (GLUCOPHAGE) 500 MG tablet; Take 1 tablet (500 mg total) by mouth 2 (two) times daily with a meal.  Dispense: 90 tablet; Refill: 2 - BMP with eGFR; Future - Follow up wit PCP in one week 2. Hypotension, unspecified hypotension type - Take 10 mg of Lisinopril instead of 20. Continue to monitor blood pressures at home.  - Follow up with PCP in one week  * Spoke to Dr. Elease Hashimoto about this plan and is he is ok with it.

## 2015-03-14 NOTE — Telephone Encounter (Signed)
Please advise on work in

## 2015-03-14 NOTE — Progress Notes (Signed)
Pre visit review using our clinic review tool, if applicable. No additional management support is needed unless otherwise documented below in the visit note. 

## 2015-03-14 NOTE — Telephone Encounter (Signed)
Pt already seen this AM by [facility].

## 2015-03-14 NOTE — Telephone Encounter (Signed)
Called and spoke with pt's wife and informed of TP rec Wife voiced understanding of instructions and stated they would keep the appt with VS  Nothing further is needed at this time.

## 2015-03-14 NOTE — Telephone Encounter (Signed)
Per 11/14 OV: Patient Instructions       Continue prednisone 30 mg daily for 2 weeks, 20 mg daily for 2 weeks, then 10 mg daily and hold at this dose  --  Called spoke with pt spouse. She reports pt never dropped down to 20 mg of prednisone like advised after being in pred 30 mg x 2 weeks. Pt ended up in ED 03/11/15. Pt BS friday was in the 400's and pt had blurred vision. They advised pt to drop his prednisone to 20 mg. Since 12/3 pt has been taking pred 20 mg daily. Pt PCP advised for them to call us to see if he can go ahead and drop down to 10 mg daily now.  Please advise TP thanks

## 2015-03-21 ENCOUNTER — Encounter: Payer: Self-pay | Admitting: Family Medicine

## 2015-03-21 ENCOUNTER — Ambulatory Visit (INDEPENDENT_AMBULATORY_CARE_PROVIDER_SITE_OTHER): Payer: Federal, State, Local not specified - PPO | Admitting: Family Medicine

## 2015-03-21 VITALS — BP 90/68 | HR 73 | Temp 98.1°F | Resp 14 | Ht 71.0 in | Wt 222.0 lb

## 2015-03-21 DIAGNOSIS — I9589 Other hypotension: Secondary | ICD-10-CM

## 2015-03-21 DIAGNOSIS — R739 Hyperglycemia, unspecified: Secondary | ICD-10-CM | POA: Diagnosis not present

## 2015-03-21 DIAGNOSIS — R059 Cough, unspecified: Secondary | ICD-10-CM

## 2015-03-21 DIAGNOSIS — R05 Cough: Secondary | ICD-10-CM

## 2015-03-21 LAB — BASIC METABOLIC PANEL WITH GFR
BUN: 14 mg/dL (ref 7–25)
CALCIUM: 8.6 mg/dL (ref 8.6–10.3)
CO2: 24 mmol/L (ref 20–31)
CREATININE: 0.96 mg/dL (ref 0.70–1.25)
Chloride: 97 mmol/L — ABNORMAL LOW (ref 98–110)
GFR, Est African American: >89 mL/min (ref >=60)
GFR, Est Non African American: 80 mL/min (ref >=60)
GLUCOSE: 249 mg/dL — AB (ref 65–99)
Potassium: 3.7 mmol/L (ref 3.5–5.3)
Sodium: 132 mmol/L — ABNORMAL LOW (ref 135–146)

## 2015-03-21 NOTE — Progress Notes (Signed)
Subjective:    Patient ID: Ronald [name], male    DOB: 29-Oct-1945, 69 y.o.   MRN: [id number]  HPI Patient here for follow-up hyperglycemia Recent interstitial lung process and placed on prednisone Currently on prednisone 10 mg daily.  On 03/11/2015 he developed some blurred vision. Went to ophthalmologist. Blood sugar 415. Sent to ER and started glipizide 5 mg twice a day. A1c 10.9%. Subsequently he was seen here in added metformin 500 milligrams twice a day. Fasting blood sugar today 184. Still has some blurred vision. Mild polyuria. Increased fatigue.  Recent low blood pressure. Lisinopril reduce from 20 mg and 10 mg. Still has persistent cough. No orthostatic symptoms.  Past Medical History  Diagnosis Date  . Hypertension   . Hyperlipidemia   . Gout   . Tinea versicolor   . OSA on CPAP     per study 08-03-2004  Severe OSA  . Allergic rhinitis   . Prostate cancer [facility])     Dx  2014--  stage T1c, Gleason 3+3=6, PSA 6.67--  Active surveillance  . Bilateral hydrocele   . History of colon polyps     tubular adenoma 2013  . Sigmoid diverticulosis   . Cigarette smoker two packs a day or less    Past Surgical History  Procedure Laterality Date  . Colonoscopy  last one 06-07-2011  . Prostate biopsy  05/15/12    Clinically both Lobes  . Tonsillectomy  as child  . Laminectomy and microdiscectomy lumbar spine  12-23-2003    Left L5 -- S1  . Laparoscopic bilateral inguinal hernia repair/  umbilical hernia repair with mesh/  aspiration left hydrocele  07-11-2012  . Transthoracic echocardiogram  05/01/2014    mild LVH; EF 50-55% (cannot measure diastolic Fxn with Afib), Mod LA Dilation, mild MR  . Tee without cardioversion N/A 05/07/2014    Procedure: TRANSESOPHAGEAL ECHOCARDIOGRAM (TEE);  Surgeon: [name], MD;  Location: [facility] ENDOSCOPY;  Service: Cardiovascular;  Laterality: N/A;  . Cardioversion N/A 05/07/2014    Procedure: CARDIOVERSION;  Surgeon: [name], MD;   Location: [place];  Service: Cardiovascular;  Laterality: N/A;  . Nm myoview ltd  05/18/2014    Low risk study. Normal perfusion: No ischemia or infarction. Mild LV dysfunction - 46% (does not correlate with echocardiographic EF of 50-55%)  . Cardioversion N/A 09/09/2014    Procedure: CARDIOVERSION;  Surgeon: Lelon Perla, MD;  Location: [facility] ENDOSCOPY;  Service: Cardiovascular;  Laterality: N/A;    reports that he has been smoking Cigarettes.  He has a 55 pack-year smoking history. He has never used smokeless tobacco. He reports that he drinks about 3.0 oz of alcohol per week. He reports that he does not use illicit drugs. family history includes Breast cancer in his mother; Ovarian cancer in his sister; Stroke in an other family member. There is no history of Colon cancer, Esophageal cancer, Rectal cancer, or Stomach cancer. No Known Allergies    Review of Systems  Constitutional: Positive for fatigue. Negative for chills, appetite change and unexpected weight change.  Eyes: Negative for visual disturbance.  Respiratory: Positive for cough. Negative for chest tightness and shortness of breath (Not at rest).   Cardiovascular: Negative for chest pain, palpitations and leg swelling.  Endocrine: Positive for polydipsia and polyuria.  Genitourinary: Negative for dysuria.  Neurological: Negative for dizziness, syncope, weakness, light-headedness and headaches.       Objective:   Physical Exam  Constitutional: He appears well-developed and well-nourished.  Neck: Neck supple.  Cardiovascular: Normal rate and regular rhythm.   Pulmonary/Chest: Effort normal and breath sounds normal. No respiratory distress. He has no wheezes. He has no rales.  Musculoskeletal: He exhibits no edema.  Neurological: He is alert.          Assessment & Plan:  #1 type 2 diabetes with recent exacerbation secondary to prednisone. Continue glipizide and metformin. As prednisone dose reduced may need to  reduce his glipizide and we gave them parameters. Reduce high glycemic foods #2 low blood pressure. Standing blood pressure today left arm 100/64. Discontinue lisinopril for now #3 recent acute renal worsening. Suspect prerenal related to his high blood sugars. Recheck basic metabolic panel #4 chronic cough. Question of interstitial lung disease. Discontinue lisinopril which hopefully will help his cough somewhat

## 2015-03-21 NOTE — Patient Instructions (Addendum)
HOLD Lisinopril for now.  IF blood sugars start to drop below 70, hold the Glipizide.  Stay well hydrated.   Let's plan follow up in 2 weeks.

## 2015-03-21 NOTE — Progress Notes (Signed)
Pre visit review using our clinic review tool, if applicable. No additional management support is needed unless otherwise documented below in the visit note. 

## 2015-04-05 ENCOUNTER — Ambulatory Visit (INDEPENDENT_AMBULATORY_CARE_PROVIDER_SITE_OTHER): Payer: Federal, State, Local not specified - PPO | Admitting: Family Medicine

## 2015-04-05 VITALS — BP 120/82 | HR 75 | Temp 98.4°F | Resp 16 | Ht 71.0 in | Wt 225.1 lb

## 2015-04-05 DIAGNOSIS — I481 Persistent atrial fibrillation: Secondary | ICD-10-CM | POA: Diagnosis not present

## 2015-04-05 DIAGNOSIS — I4819 Other persistent atrial fibrillation: Secondary | ICD-10-CM

## 2015-04-05 DIAGNOSIS — R739 Hyperglycemia, unspecified: Secondary | ICD-10-CM | POA: Diagnosis not present

## 2015-04-05 DIAGNOSIS — T50905A Adverse effect of unspecified drugs, medicaments and biological substances, initial encounter: Secondary | ICD-10-CM | POA: Insufficient documentation

## 2015-04-05 DIAGNOSIS — I10 Essential (primary) hypertension: Secondary | ICD-10-CM | POA: Diagnosis not present

## 2015-04-05 NOTE — Patient Instructions (Signed)
Monitor blood pressure and be in touch if consistently > 140/90 IF blood sugar starts to drop below 70 drop Glipizide to one daily Lets plan to repeat A1C in about 3 months.

## 2015-04-05 NOTE — Progress Notes (Signed)
Subjective:    Patient ID: Ronald [name], male    DOB: 09/29/1945, 69 y.o.   MRN: [id number]  HPI Patient seen for follow-up  Long-standing smoker who has history of bronchiolitis with interstitial lung disease. Placed per pulmonary recently on high-dose prednisone. He subsequently developed blurred vision, fatigue, urine frequency, thirst, weight loss. Blood sugars over 400. Patient initially placed on glipizide 5 mg twice a day. We subsequently added metformin 500 milligrams twice a day  Fasting blood sugars over the past weeks have dramatically improved. These are mostly low 100s. His symptoms of fatigue, urine frequency, thirst resolving. He has gained back 3 pounds. Blurred vision is slowly improving  Currently on prednisone 10 mg daily.  Recent hypotension and acute renal failure. Recent renal profile revealed normal creatinine. We held his lisinopril altogether and recent home blood pressures been very stable with [id number] to [id number] systolic. Cough is improved  Past Medical History  Diagnosis Date  . Hypertension   . Hyperlipidemia   . Gout   . Tinea versicolor   . OSA on CPAP     per study 08-03-2004  Severe OSA  . Allergic rhinitis   . Prostate cancer [facility])     Dx  2014--  stage T1c, Gleason 3+3=6, PSA 6.67--  Active surveillance  . Bilateral hydrocele   . History of colon polyps     tubular adenoma 2013  . Sigmoid diverticulosis   . Cigarette smoker two packs a day or less    Past Surgical History  Procedure Laterality Date  . Colonoscopy  last one 06-07-2011  . Prostate biopsy  05/15/12    Clinically both Lobes  . Tonsillectomy  as child  . Laminectomy and microdiscectomy lumbar spine  12-23-2003    Left L5 -- S1  . Laparoscopic bilateral inguinal hernia repair/  umbilical hernia repair with mesh/  aspiration left hydrocele  07-11-2012  . Transthoracic echocardiogram  05/01/2014    mild LVH; EF 50-55% (cannot measure diastolic Fxn with Afib), Mod LA Dilation, mild MR   . Tee without cardioversion N/A 05/07/2014    Procedure: TRANSESOPHAGEAL ECHOCARDIOGRAM (TEE);  Surgeon: [name], MD;  Location: [facility] ENDOSCOPY;  Service: Cardiovascular;  Laterality: N/A;  . Cardioversion N/A 05/07/2014    Procedure: CARDIOVERSION;  Surgeon: [name], MD;  Location: [place];  Service: Cardiovascular;  Laterality: N/A;  . Nm myoview ltd  05/18/2014    Low risk study. Normal perfusion: No ischemia or infarction. Mild LV dysfunction - 46% (does not correlate with echocardiographic EF of 50-55%)  . Cardioversion N/A 09/09/2014    Procedure: CARDIOVERSION;  Surgeon: Lelon Perla, MD;  Location: [facility] ENDOSCOPY;  Service: Cardiovascular;  Laterality: N/A;    reports that he has been smoking Cigarettes.  He has a 55 pack-year smoking history. He has never used smokeless tobacco. He reports that he drinks about 3.0 oz of alcohol per week. He reports that he does not use illicit drugs. family history includes Breast cancer in his mother; Ovarian cancer in his sister. There is no history of Colon cancer, Esophageal cancer, Rectal cancer, or Stomach cancer. No Known Allergies   Review of Systems  Constitutional: Negative for fever and chills.  Cardiovascular: Negative for chest pain and palpitations.       Mild leg edema which is chronic and unchanged  Endocrine: Negative for polydipsia, polyphagia and polyuria.  Neurological: Negative for dizziness and weakness.  Psychiatric/Behavioral: Negative for confusion.  Objective:   Physical Exam  Constitutional: He is oriented to person, place, and time. He appears well-developed and well-nourished.  Neck: Neck supple. No JVD present.  Cardiovascular: Normal rate and regular rhythm.   Pulmonary/Chest: Effort normal and breath sounds normal.  Musculoskeletal:  Trace leg edema bilaterally  Neurological: He is alert and oriented to person, place, and time.  Psychiatric: He has a normal mood and affect. His behavior is  normal.          Assessment & Plan:  #1 hyperglycemia exacerbated by recent prednisone. Improving. Continue glipizide and metformin. If blood sugars dipping below 70 he will taper off glipizide. Plan repeat A1c in 3 months and continue with low glycemic diet in the meantime  #2 hypertension. Stable and at goal. Continue off lisinopril for now. If blood pressure starts to climb over 140/90 consider adding back angiotensin receptor blocker for renal protection with his diabetes  #3 history of atrial fibrillation. Currently in sinus rhythm. Continue close follow-up with cardiology

## 2015-04-05 NOTE — Progress Notes (Signed)
Pre visit review using our clinic review tool, if applicable. No additional management support is needed unless otherwise documented below in the visit note. 

## 2015-04-07 ENCOUNTER — Telehealth: Payer: Self-pay | Admitting: Family Medicine

## 2015-04-07 MED ORDER — GLIPIZIDE 5 MG PO TABS: 5.0000 mg | ORAL_TABLET | Freq: Two times a day (BID) | ORAL | Status: DC

## 2015-04-07 NOTE — Telephone Encounter (Signed)
Pt was seen on 12/27-16 and needs refill on glipizide 5 mg #60 w/refills send to [facility]

## 2015-04-07 NOTE — Telephone Encounter (Signed)
Medication sent into pharmacy for patient.  

## 2015-04-12 ENCOUNTER — Ambulatory Visit (INDEPENDENT_AMBULATORY_CARE_PROVIDER_SITE_OTHER): Payer: Federal, State, Local not specified - PPO | Admitting: Adult Health

## 2015-04-12 ENCOUNTER — Encounter: Payer: Self-pay | Admitting: Adult Health

## 2015-04-12 ENCOUNTER — Ambulatory Visit (INDEPENDENT_AMBULATORY_CARE_PROVIDER_SITE_OTHER)
Admission: RE | Admit: 2015-04-12 | Discharge: 2015-04-12 | Disposition: A | Discharge status: Home / Self Care | Payer: Federal, State, Local not specified - PPO | Source: Ambulatory Visit | Attending: Adult Health | Admitting: Adult Health

## 2015-04-12 VITALS — BP 104/78 | HR 55 | Temp 98.0°F | Ht 71.0 in | Wt 226.0 lb

## 2015-04-12 DIAGNOSIS — J84115 Respiratory bronchiolitis interstitial lung disease: Secondary | ICD-10-CM

## 2015-04-12 DIAGNOSIS — R05 Cough: Secondary | ICD-10-CM | POA: Diagnosis not present

## 2015-04-12 DIAGNOSIS — F17210 Nicotine dependence, cigarettes, uncomplicated: Secondary | ICD-10-CM

## 2015-04-12 DIAGNOSIS — G4733 Obstructive sleep apnea (adult) (pediatric): Secondary | ICD-10-CM | POA: Diagnosis not present

## 2015-04-12 NOTE — Addendum Note (Signed)
Addended by: Osa Craver on: 04/12/2015 09:36 AM   Modules accepted: Orders

## 2015-04-12 NOTE — Progress Notes (Signed)
Reviewed and agree with assessment/plan. 

## 2015-04-12 NOTE — Patient Instructions (Addendum)
Decrease Prednisone 10mg  1/2 daily for 1 week then 1/2 every other day for 1 week and stop.  Continue to work on not smoking. May use Nicotrol inhaler to help with stopping smoking Continue on CPAP At bedtime  .  Work on weight loss.  Chest xray today .  Follow up in  6 weeks with Dr. Halford Chessman.  Please contact office for sooner follow up if symptoms do not improve or worsen or seek emergency care

## 2015-04-12 NOTE — Assessment & Plan Note (Signed)
Smoking cessation  

## 2015-04-12 NOTE — Assessment & Plan Note (Signed)
Continue on CPAP At bedtime  .  Work on weight loss.  Follow up in  6 weeks with Dr. Halford Chessman.  Please contact office for sooner follow up if symptoms do not improve or worsen or seek emergency care

## 2015-04-12 NOTE — Progress Notes (Signed)
Quick Note:  Called and spoke with patient. Reviewed results and recs. Pt voiced understanding and had no further questions. ______ 

## 2015-04-12 NOTE — Assessment & Plan Note (Signed)
Cliinically improved on prednisone ,  Most important goal is to quit smoking  Will taper prednisone to off . (new onset DM on steroids )  Check cxr today  follow up in 6 weeks   Plan  Decrease Prednisone 10mg  1/2 daily for 1 week then 1/2 every other day for 1 week and stop.  Continue to work on not smoking. May use Nicotrol inhaler to help with stopping smoking Work on weight loss.  Chest xray today .  Follow up in  6 weeks with Dr. Halford Chessman.  Please contact office for sooner follow up if symptoms do not improve or worsen or seek emergency care

## 2015-04-12 NOTE — Progress Notes (Signed)
Subjective:    Patient ID: Ronald [name], male    DOB: [birth year], 70 y.o.   MRN: [id number]  HPI 70 yo smoker with OSA and dyspnea  Has Atrial Fib on Amiodarone and Eliquis    04/12/2015 Follow up :: OSA and Dyspnea  He returns for 6 week  follow up.  CT done 10/24 showed diffuse centrilobular ground glass micronodularity and mild emphysema.  Was concerning for respiratory bronchiolitis ILD . He was encouraged on smoking cessation .  He was started on prednisone 40mg  daily initially in November with slow taper.  Was also on Lisinopril . His cough did improve on prednisone .  He was started on slow taper. Unfortunately Blood sugars increased and he was dx with DM .  Started on DM rx with improved control.  Currently on prednisone 10mg  daily .  Still has cough but not as bad. Has occasional production with yellow mucus on/off and intermittent nasal drip and wheezing   He is still smoking , Discussed smoking cessation.   Has OSA on CPAP  Download shows excellent compliance with good control . AHI 0.2 on autoset. avg usage 8 . Min leaks.  Feels rested. .    TEST  PSG 07/14/04 >> AHI 77 Echo 05/04/14 >> mild LVH, EF 50 to 55%, mild MR, mod LA dilation PFT 11/01/14 >> FEV1 3.42 (104%), FEV1% 81, TLC 7.00 (99%), DLCO 55%, + BD Auto CPAP 12/20/14 to 01/18/15 >> used on 30 of 30 nights with average 9 hrs and 26 min.  Average AHI is 2 with median CPAP 8 cm H2O and 95 th percentile CPAP 11 cm H20.   Review of Systems Constitutional:   No  weight loss, night sweats,  Fevers, chills, fatigue, or  lassitude.  HEENT:   No headaches,  Difficulty swallowing,  Tooth/dental problems, or  Sore throat,                No sneezing, itching, ear ache, +nasal congestion, post nasal drip,   CV:  No chest pain,  Orthopnea, PND, swelling in lower extremities, anasarca, dizziness, palpitations, syncope.   GI  No heartburn, indigestion, abdominal pain, nausea, vomiting, diarrhea, change in bowel  habits, loss of appetite, bloody stools.   Resp no hemoptyiss   Skin: no rash or lesions.  GU: no dysuria, change in color of urine, no urgency or frequency.  No flank pain, no hematuria   MS:  No joint pain or swelling.  No decreased range of motion.  No back pain.  Psych:  No change in mood or affect. No depression or anxiety.  No memory loss.         Objective:   Physical Exam  GEN: A/Ox3; pleasant , NAD, obese  VS reviewed   HEENT:  [place]/AT,  EACs-clear, TMs-wnl, NOSE-clear, THROAT-clear, no lesions, no postnasal drip or exudate noted. Poor dentition , Class 2 MP airway   NECK:  Supple w/ fair ROM; no JVD; normal carotid impulses w/o bruits; no thyromegaly or nodules palpated; no lymphadenopathy.  RESP  Clear  P & A; w/o, wheezes/ rales/ or rhonchi.no accessory muscle use, no dullness to percussion  CARD:  RRR, no m/r/g  , no peripheral edema, pulses intact, no cyanosis or clubbing.  GI:   Soft & nt; nml bowel sounds; no organomegaly or masses detected.  Musco: Warm bil, no deformities or joint swelling noted.   Neuro: alert, no focal deficits noted.    Skin: Warm, no lesions or  rashes        Assessment & Plan:

## 2015-04-13 ENCOUNTER — Other Ambulatory Visit (HOSPITAL_COMMUNITY): Payer: Self-pay | Admitting: Nurse Practitioner

## 2015-04-15 ENCOUNTER — Other Ambulatory Visit: Payer: Self-pay | Admitting: *Deleted

## 2015-04-15 MED ORDER — METOPROLOL TARTRATE 25 MG PO TABS: 25.0000 mg | ORAL_TABLET | Freq: Two times a day (BID) | ORAL | Status: DC

## 2015-05-05 ENCOUNTER — Encounter: Payer: Self-pay | Admitting: Adult Health

## 2015-05-07 ENCOUNTER — Other Ambulatory Visit: Payer: Self-pay | Admitting: Family Medicine

## 2015-05-17 ENCOUNTER — Ambulatory Visit (INDEPENDENT_AMBULATORY_CARE_PROVIDER_SITE_OTHER): Payer: Federal, State, Local not specified - PPO | Admitting: Cardiology

## 2015-05-17 ENCOUNTER — Encounter: Payer: Self-pay | Admitting: Cardiology

## 2015-05-17 VITALS — BP 116/80 | HR 55 | Ht 71.0 in | Wt 232.3 lb

## 2015-05-17 DIAGNOSIS — E8881 Metabolic syndrome: Secondary | ICD-10-CM

## 2015-05-17 DIAGNOSIS — E785 Hyperlipidemia, unspecified: Secondary | ICD-10-CM

## 2015-05-17 DIAGNOSIS — I481 Persistent atrial fibrillation: Secondary | ICD-10-CM

## 2015-05-17 DIAGNOSIS — E669 Obesity, unspecified: Secondary | ICD-10-CM | POA: Diagnosis not present

## 2015-05-17 DIAGNOSIS — Z720 Tobacco use: Secondary | ICD-10-CM

## 2015-05-17 DIAGNOSIS — I4819 Other persistent atrial fibrillation: Secondary | ICD-10-CM

## 2015-05-17 DIAGNOSIS — I10 Essential (primary) hypertension: Secondary | ICD-10-CM | POA: Diagnosis not present

## 2015-05-17 MED ORDER — APIXABAN 5 MG PO TABS: 5.0000 mg | ORAL_TABLET | Freq: Two times a day (BID) | ORAL | Status: DC

## 2015-05-17 NOTE — Progress Notes (Signed)
PCP: [name], MD  Clinic Note: Chief Complaint  Patient presents with  . Follow-up    6 month//pt c/o SOB on exertion, and swelling in bilateral feet/ankles  . Atrial Fibrillation    HPI: Ronald [name] is a 70 y.o. male with a PMH below who presents today for 5-6 month follow-up from atrial fibrillation clinic. He is a pleasant gentleman with a prior history of hypertension, hyperlipidemia and mild leg edema who was initially diagnosed with A. fib back in January 2016 during a preop evaluation for bilateral hydrocelectomy. He had A. fib with RVR. He was initially started on calcium channel blocker the beta blocker for rate control but he suddenly reverted back to A. fib. Finally based on symptoms we sent him to the A. fib clinic and we eventually used amiodarone for rhythm control. He did not tolerate the higher doses of beta blocker and calcium channel blocker while still being in A. fib. He is now on amiodarone plus diltiazem and low-dose Lopressor. He chemically cardioverted on amiodarone, as far as I can tell has been in sinus since.  Ronald [name] was last seen on October 29 by me and then back in A. fib clinic on September 19. At that time his beta blocker was reduced to 25 twice a day. He was also scheduled to see Dr. Halford Chessman from pulmonary medicine. -> Blood pressure and energy level improved with reduced beta blocker dose.  Recent Hospitalizations: none  Studies Reviewed: None  He actually quit smoking the end of January and is fully quit by February. He is started notice weight gain   Interval History: Overall Ronald [name] is feeling much better from being in sinus rhythm. His energy is notably improved. Breathing low but better since stopping his smoking, but is worried about gaining weight. He was started on diabetes medications for hyperglycemia over to fall and is hoping to not be on them for long-term. He has had less edema and so he is taking his Lasix on when  necessary basis. But he has been taking it now for the last couple days twice daily. Prior to this he was relatively stable. Line he is having some mild cramping and aching in his legs that is somewhat concerning to him being on a statin.  From a cardiac standpoint he really isn't not had any significant rapid irregular heartbeat sensations. Nothing to suggest recurrence of A. fib. He was very symptomatic while in A. fib, and has not had those symptoms of fatigue and dyspnea. He denies any significant reduction in energy after having back his beta blocker down. He is able to do some exercising and is a his heart rate up some. He denies any heart failure symptoms of PND, orthopnea to go along the edema. These try to be active, but he says his legs, aching and feels somewhat weak. His hydroceles also start to get uncomfortable.   No chest pain or shortness of breath with rest or exertion.  Minimal  palpitations,  but no lightheadedness, dizziness, weakness or syncope/near syncope. No TIA/amaurosis fugax symptoms. No melena, hematochezia, hematuria, or epstaxis. No claudication.  ROS: A comprehensive was performed. Review of Systems  Constitutional: Positive for malaise/fatigue (Improved with low-dose beta blocker).  HENT: Negative for nosebleeds.   Respiratory: Positive for cough (better since being started on prednisone by his lung doctor.).   Cardiovascular: Positive for leg swelling.       Per history of present illness  Musculoskeletal: Positive for myalgias (Legs).  Neurological: Negative for dizziness and headaches.  Endo/Heme/Allergies: Does not bruise/bleed easily.  Psychiatric/Behavioral: Negative for depression and memory loss. The patient is not nervous/anxious.   All other systems reviewed and are negative.   Past Medical History  Diagnosis Date  . Hypertension   . Hyperlipidemia   . Gout   . Tinea versicolor   . OSA on CPAP     per study 08-03-2004  Severe OSA  . Allergic  rhinitis   . Prostate cancer [facility])     Dx  2014--  stage T1c, Gleason 3+3=6, PSA 6.67--  Active surveillance  . Bilateral hydrocele   . History of colon polyps     tubular adenoma 2013  . Sigmoid diverticulosis   . Cigarette smoker two packs a day or less     Past Surgical History  Procedure Laterality Date  . Colonoscopy  last one 06-07-2011  . Prostate biopsy  05/15/12    Clinically both Lobes  . Tonsillectomy  as child  . Laminectomy and microdiscectomy lumbar spine  12-23-2003    Left L5 -- S1  . Laparoscopic bilateral inguinal hernia repair/  umbilical hernia repair with mesh/  aspiration left hydrocele  07-11-2012  . Transthoracic echocardiogram  05/01/2014    mild LVH; EF 50-55% (cannot measure diastolic Fxn with Afib), Mod LA Dilation, mild MR  . Tee without cardioversion N/A 05/07/2014    Procedure: TRANSESOPHAGEAL ECHOCARDIOGRAM (TEE);  Surgeon: [name], MD;  Location: [facility] ENDOSCOPY;  Service: Cardiovascular;  Laterality: N/A;  . Cardioversion N/A 05/07/2014    Procedure: CARDIOVERSION;  Surgeon: [name], MD;  Location: [place];  Service: Cardiovascular;  Laterality: N/A;  . Nm myoview ltd  05/18/2014    Low risk study. Normal perfusion: No ischemia or infarction. Mild LV dysfunction - 46% (does not correlate with echocardiographic EF of 50-55%)  . Cardioversion N/A 09/09/2014    Procedure: CARDIOVERSION;  Surgeon: Lelon Perla, MD;  Location: [facility] ENDOSCOPY;  Service: Cardiovascular;  Laterality: N/A;   Prior to Admission medications   Medication Sig Start Date End Date Taking? Authorizing Provider  acetaminophen (TYLENOL) 500 MG tablet Take 500 mg by mouth every 6 (six) hours as needed for mild pain or moderate pain.   Yes Historical Provider, MD  amiodarone (PACERONE) 200 MG tablet Take 1 tablet (200 mg total) by mouth daily. 11/29/14  Yes Sherran Needs, NP  apixaban (ELIQUIS) 5 MG TABS tablet Take 1 tablet (5 mg total) by mouth 2 (two) times daily. 05/17/15   Yes Leonie Man, MD  atorvastatin (LIPITOR) 10 MG tablet Take 1 tablet (10 mg total) by mouth every evening. 05/08/14  Yes Modena Jansky, MD  diltiazem (CARDIZEM CD) 360 MG 24 hr capsule TAKE 1 CAPSULE DAILY 03/08/15  Yes [name], MD  fluticasone (FLONASE) 50 MCG/ACT nasal spray Place 2 sprays into both nostrils daily as needed for allergies or rhinitis. 05/08/14  Yes Modena Jansky, MD  furosemide (LASIX) 40 MG tablet TAKE 1 TABLET BY MOUTH DAILY Patient taking differently: Take 2 daily 05/09/15  Yes [name], MD  glipiZIDE (GLUCOTROL) 5 MG tablet Take 1 tablet (5 mg total) by mouth 2 (two) times daily before a meal. 04/07/15  Yes [name], MD  glucose blood test strip Use as instructed 03/11/15  Yes Charlesetta Shanks, MD  metFORMIN (GLUCOPHAGE) 500 MG tablet Take 1 tablet (500 mg total) by mouth 2 (two) times daily with a meal. 03/14/15  Yes  Dorothyann Peng, NP  metoprolol tartrate (LOPRESSOR) 25 MG tablet Take 1 tablet (25 mg total) by mouth 2 (two) times daily. 04/15/15  Yes Leonie Man, MD  nicotine (NICOTROL) 10 MG inhaler Inhale 1 cartridge (1 continuous puffing total) into the lungs as needed for smoking cessation. 02/21/15  Yes Tammy S Parrett, NP  predniSONE (DELTASONE) 20 MG tablet Take 30 mg daily for 2 weeks, 20 mg daily for 2 weeks, then 10 mg daily and hold at this dose Patient taking differently: Take 10 mg by mouth daily with breakfast. Takes 1/2 tablet daily 02/21/15  Yes Tammy S Parrett, NP  tamsulosin (FLOMAX) 0.4 MG CAPS capsule Take 0.4 mg by mouth daily. 02/14/15  Yes Historical Provider, MD  triamcinolone cream (KENALOG) 0.1 % Apply 1 application topically 2 (two) times daily as needed (for rash). 05/08/14  Yes Modena Jansky, MD   No Known Allergies  Social History   Social History  . Marital Status: Married    Spouse Name: N/A  . Number of Children: N/A  . Years of Education: N/A   Occupational History  . Sales    Social History Main  Topics  . Smoking status: Current Some Day Smoker -- 1.00 packs/day for 55 years    Types: Cigarettes  . Smokeless tobacco: Never Used  . Alcohol Use: 3.6 oz/week    6 Standard drinks or equivalent per week  . Drug Use: No  . Sexual Activity: Not Asked   Other Topics Concern  . None   Social History Narrative   Married 25 years   2 children but not with this wife   Family History  Problem Relation Age of Onset  . Stroke      Unknown   . Colon cancer Neg Hx   . Esophageal cancer Neg Hx   . Rectal cancer Neg Hx   . Stomach cancer Neg Hx   . Breast cancer Mother   . Ovarian cancer Sister     Wt Readings from Last 3 Encounters:  05/17/15 232 lb 4.8 oz (105.371 kg)  04/12/15 226 lb (102.513 kg)  04/05/15 225 lb 1.6 oz (102.105 kg)  Weight gain since stopping smoking  PHYSICAL EXAM BP 116/80 mmHg  Pulse 55  Ht 5\' 11"  (1.803 m)  Wt 232 lb 4.8 oz (105.371 kg)  BMI 32.41 kg/m2 General appearance: alert, cooperative, appears stated age, no distress and mildy obese Neck: no adenopathy, no carotid bruit and no JVD Lungs: clear to auscultation bilaterally, normal percussion bilaterally and non-labored Heart: Bradycardic rate and rhythm, S1, S2 normal, no murmur, click, rub or gallop; Abdomen: soft, non-tender; bowel sounds normal; no masses, no organomegaly; mild truncal obesity Extremities: extremities normal, atraumatic, no cyanosis, and edema -- trace ankle Pulses: 2+ and symmetric; Skin: normal and mobility and turgor normal ; mild spider veins on bilateral ankles. Neurologic: Mental status: Alert, oriented, thought content appropriate Cranial nerves: normal (II-XII grossly intact)    Adult ECG Report  Rate: 55 ;  Rhythm: sinus bradycardia; Nonspecific ST and T-wave flattening with T-wave inversions in leads III and aVF  Narrative Interpretation: Relatively stable EKG. Axis is -23)  Other studies Reviewed: Additional studies/ records that were reviewed today include:   Recent Labs:  PCP follows lipid labs    ASSESSMENT / PLAN: Problem List Items Addressed This Visit    Tobacco abuse (Chronic)    Smoking cessation instruction/counseling given:  commended patient for quitting and reviewed strategies for preventing relapses  Obesity (BMI 30-39.9) (Chronic)    Once he gets through the smoking cessation phase, I'm hoping that he can start back with some weight loss. We discussed importance of exercise and dietary modification. This will also help with his glycemic control.      Metabolic syndrome (Chronic)    With obesity, hypertension and glucose intolerance, he meets criteria. This also puts him of the risk equivalent to coronary disease.  Cardiac evaluation to date has been nonischemic.      Hyperlipidemia (Chronic)    On statin. Monitored by PCP.      Relevant Medications   apixaban (ELIQUIS) 5 MG TABS tablet   Essential hypertension (Chronic)    Well-controlled on current dose of diltiazem and metoprolol.      Relevant Medications   apixaban (ELIQUIS) 5 MG TABS tablet   Atrial fibrillation, persistent (HCC) - Primary (Chronic)    Finally able to maintain sinus rhythm with amiodarone. He is somewhat bradycardic, but doing relatively well. We could potentially come off of the metoprolol altogether. I think overall rhythm control seems to be the best thing for him. Question is could be how long we keep him on amiodarone. I will pose the question to our H for ablation clinic., But as far as I'm concerned we continue long-term. He just needs to have the routine LFTs &  thyroid studies and lung functions monitored. With his lung condition, amiodarone may not be a great idea long-term.  anticoagulated with ELIQUIS. Okay to hold for muscle biopsies and for hydrocele surgery.  This patients CHA2DS2-VASc Score and unadjusted Ischemic Stroke Rate (% per year) is equal to 3.2 % stroke rate/year from a score of 3  Above score calculated as 1 point  each if present [CHF, HTN, DM, Vascular=MI/PAD/Aortic Plaque, Age if 65-74, or Male] Above score calculated as 2 points each if present [Age > 75, or Stroke/TIA/TE]        Relevant Medications   apixaban (ELIQUIS) 5 MG TABS tablet      Current medicines are reviewed at length with the patient today. (+/- concerns) ? Atorvastatin with leg aching; ? Holding Eliquis for Biopsy of leg muscle & Hydrocele Sgx The following changes have been made:    YOU MAY HOLD ATORVASTATIN ( LIPITOR) FOR 1 MONTH -TO SEE IF YOUR LEG PAIN STOPS- RESTART, ATORVASTATIN IF PAIN RETURNS   CONTACT OFFICE.  OKAY TO HOLD ELIQUIS---  IF YOU HAVE THE HYDROCELL FOR 2 DAYS; IF BIOPSY- 24 HOURS HOLD MEDICATION.  NO OTHER CHANGES CURRENTLY  Your physician wants you to follow-up in July/AUGUST 2017 WITH DR HARDING.  Studies Ordered:   No orders of the defined types were placed in this encounter.      Leonie Man, M.D., M.S. Interventional Cardiologist   Pager # [phone] Phone # [phone] [address] [place], [place] [address]

## 2015-05-17 NOTE — Patient Instructions (Signed)
YOU MAY HOLD ATORVASTATIN ( LIPITOR) FOR 1 MONTH -TO SEE IF YOUR LEG PAIN STOPS- RESTART, ATORVASTATIN IF PAIN RETURNS   CONTACT OFFICE.  OKAY TO HOLD ELIQUIS---  IF YOU HAVE THE HYDROCELL FOR 2 DAYS; IF BIOPSY- 24 HOURS HOLD MEDICATION.  NO OTHER CHANGES CURRENTLY  Your physician wants you to follow-up in July/AUGUST 2017 WITH DR HARDING.  You will receive a reminder letter in the mail two months in advance. If you don't receive a letter, please call our office to schedule the follow-up appointment.  If you need a refill on your cardiac medications before your next appointment, please call your pharmacy.

## 2015-05-19 ENCOUNTER — Encounter: Payer: Self-pay | Admitting: Cardiology

## 2015-05-19 NOTE — Assessment & Plan Note (Signed)
Once he gets through the smoking cessation phase, I'm hoping that he can start back with some weight loss. We discussed importance of exercise and dietary modification. This will also help with his glycemic control.

## 2015-05-19 NOTE — Assessment & Plan Note (Signed)
Well-controlled on current dose of diltiazem and metoprolol.

## 2015-05-19 NOTE — Assessment & Plan Note (Signed)
With obesity, hypertension and glucose intolerance, he meets criteria. This also puts him of the risk equivalent to coronary disease.  Cardiac evaluation to date has been nonischemic.

## 2015-05-19 NOTE — Assessment & Plan Note (Addendum)
Finally able to maintain sinus rhythm with amiodarone. He is somewhat bradycardic, but doing relatively well. We could potentially come off of the metoprolol altogether. I think overall rhythm control seems to be the best thing for him. Question is could be how long we keep him on amiodarone. I will pose the question to our H for ablation clinic., But as far as I'm concerned we continue long-term. He just needs to have the routine LFTs &  thyroid studies and lung functions monitored. With his lung condition, amiodarone may not be a great idea long-term.  anticoagulated with ELIQUIS. Okay to hold for muscle biopsies and for hydrocele surgery.  This patients CHA2DS2-VASc Score and unadjusted Ischemic Stroke Rate (% per year) is equal to 3.2 % stroke rate/year from a score of 3  Above score calculated as 1 point each if present [CHF, HTN, DM, Vascular=MI/PAD/Aortic Plaque, Age if 65-74, or Male] Above score calculated as 2 points each if present [Age > 75, or Stroke/TIA/TE]

## 2015-05-19 NOTE — Assessment & Plan Note (Signed)
On statin. Monitored by PCP. 

## 2015-05-19 NOTE — Assessment & Plan Note (Signed)
Smoking cessation instruction/counseling given:  commended patient for quitting and reviewed strategies for preventing relapses 

## 2015-06-01 ENCOUNTER — Encounter: Payer: Self-pay | Admitting: Pulmonary Disease

## 2015-06-01 ENCOUNTER — Ambulatory Visit (INDEPENDENT_AMBULATORY_CARE_PROVIDER_SITE_OTHER): Payer: Federal, State, Local not specified - PPO | Admitting: Pulmonary Disease

## 2015-06-01 VITALS — BP 108/66 | HR 58 | Ht 71.0 in | Wt 232.0 lb

## 2015-06-01 DIAGNOSIS — J438 Other emphysema: Secondary | ICD-10-CM

## 2015-06-01 DIAGNOSIS — J84115 Respiratory bronchiolitis interstitial lung disease: Secondary | ICD-10-CM

## 2015-06-01 DIAGNOSIS — J449 Chronic obstructive pulmonary disease, unspecified: Secondary | ICD-10-CM

## 2015-06-01 DIAGNOSIS — G4733 Obstructive sleep apnea (adult) (pediatric): Secondary | ICD-10-CM | POA: Diagnosis not present

## 2015-06-01 MED ORDER — NICOTINE 10 MG IN INHA: 1.0000 | RESPIRATORY_TRACT | Status: DC | PRN

## 2015-06-01 NOTE — Patient Instructions (Signed)
Follow up in 6 months 

## 2015-06-01 NOTE — Progress Notes (Signed)
Current Outpatient Prescriptions on File Prior to Visit  Medication Sig  . acetaminophen (TYLENOL) 500 MG tablet Take 500 mg by mouth every 6 (six) hours as needed for mild pain or moderate pain.  Marland Kitchen amiodarone (PACERONE) 200 MG tablet Take 1 tablet (200 mg total) by mouth daily.  Marland Kitchen apixaban (ELIQUIS) 5 MG TABS tablet Take 1 tablet (5 mg total) by mouth 2 (two) times daily.  Marland Kitchen atorvastatin (LIPITOR) 10 MG tablet Take 1 tablet (10 mg total) by mouth every evening.  . diltiazem (CARDIZEM CD) 360 MG 24 hr capsule TAKE 1 CAPSULE DAILY  . fluticasone (FLONASE) 50 MCG/ACT nasal spray Place 2 sprays into both nostrils daily as needed for allergies or rhinitis.  . furosemide (LASIX) 40 MG tablet TAKE 1 TABLET BY MOUTH DAILY (Patient taking differently: Take 2 daily)  . glipiZIDE (GLUCOTROL) 5 MG tablet Take 1 tablet (5 mg total) by mouth 2 (two) times daily before a meal.  . glucose blood test strip Use as instructed  . metFORMIN (GLUCOPHAGE) 500 MG tablet Take 1 tablet (500 mg total) by mouth 2 (two) times daily with a meal.  . metoprolol tartrate (LOPRESSOR) 25 MG tablet Take 1 tablet (25 mg total) by mouth 2 (two) times daily.  . nicotine (NICOTROL) 10 MG inhaler Inhale 1 cartridge (1 continuous puffing total) into the lungs as needed for smoking cessation.  . tamsulosin (FLOMAX) 0.4 MG CAPS capsule Take 0.4 mg by mouth daily.  Marland Kitchen triamcinolone cream (KENALOG) 0.1 % Apply 1 application topically 2 (two) times daily as needed (for rash).   No current facility-administered medications on file prior to visit.     Chief Complaint  Patient presents with  . Follow-up    Wears machine nightly. Denies problems with mask or pressure. DME: [facility]; Discuss Dyspnea. Prednisone d/c'd last OV with TP 05/09/15     Tests PSG 07/14/04 >> AHI 77 Echo 05/04/14 >> mild LVH, EF 50 to 55%, mild MR, mod LA dilation PFT 11/01/14 >> FEV1 3.42 (104%), FEV1% 81, TLC 7.00 (99%), DLCO 55%, + BD HRCT chest 01/31/15 >> diffuse  centrilobular GGO micronodules, mild airtrapping, mild centrilobular/paraseptal emphysema Auto CPAP 05/01/15 to 05/30/15 >> used on 30 of 30 nights with average 9 hrs and 49 min.  Average AHI is 1.3 with median CPAP 8 cm H2O and 95 th percentile CPAP 11 cm H20.   Past medical hx A fib, HTN, HLD, Gout, Tinea versicolor, Prostate cancer  Past surgical hx, Allergies, Family hx, Social hx all reviewed.  Vital Signs BP 108/66 mmHg  Pulse 58  Ht 5\' 11"  ([id number] m)  Wt 232 lb (105.235 kg)  BMI 32.37 kg/m2  SpO2 94%  History of Present Illness Ronald [name] is a 70 y.o. 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  Since my last visit with him he had CT chest which findings very suggestive of RB ILD.  He was started on prednisone on 02/03/15 and completed course several weeks ago.  He has also stopped smoking, and is using nicotine inhaler.  His breathing is much better.  He denies cough, wheeze, fever, hemoptysis, or chest pain.  He was having lots of trouble with elevated blood sugar and weigh gain while on prednisone >> blood sugars have improved since.  Physical Exam  General - No distress ENT - No sinus tenderness, no oral exudate, no LAN Cardiac - s1s2 regular, no murmur Chest - No wheeze/rales/dullness Back - No focal tenderness Abd - Soft,  non-tender Ext - No edema Neuro - Normal strength Skin - No rashes Psych - normal mood, and behavior   Assessment/Plan  Interstitial lung disease with clinical diagnosis of respiratory bronchiolitis. Improved clinically with course of prednisone and smoking cessation. Plan: - monitor clinically - will determine when to repeat CT chest and PFT at next visit  COPD with emphysema. Minimal symptoms. Plan: - defer inhaler therapy at this time  Obstructive sleep apnea. He is compliant with CPAP and reports benefit. Plan: - continue auto CPAP  Atrial fibrillation. Plan: - he is to continue amiodarone per  cardiology >> will need to monitor lung function while on amiodarone   Patient Instructions  Follow up in 6 months     Chesley Mires, MD [place] Pager:  [phone]

## 2015-06-02 ENCOUNTER — Telehealth: Payer: Self-pay | Admitting: Family Medicine

## 2015-06-02 MED ORDER — FUROSEMIDE 40 MG PO TABS: ORAL_TABLET | ORAL | Status: DC

## 2015-06-02 NOTE — Telephone Encounter (Signed)
May refill for #60/month.

## 2015-06-02 NOTE — Telephone Encounter (Signed)
Medication sent in for patient. 

## 2015-06-02 NOTE — Telephone Encounter (Signed)
This was not documented in note from 04/04/15. Please advise if okay to sent in #60?

## 2015-06-02 NOTE — Telephone Encounter (Signed)
Pt states at last visit Dr Elease Hashimoto said OK to take 2 furosemide (LASIX) 40 MG tablet   if he needed to , but his script is for 1/day.  Now pt is out and too soon to refill. Pt request refill of  Furosemide and ask if Dr Elease Hashimoto would change RX to 2 X /day and he will    not take 2 unless he needs Send to [facility]   Pt request refill to a different pharm: diltiazem (CARDIZEM CD) 360 MG 24 hr capsule 1 X day  90 day  [facility]

## 2015-06-13 ENCOUNTER — Telehealth: Payer: Self-pay | Admitting: *Deleted

## 2015-06-13 NOTE — Telephone Encounter (Signed)
Ronald [name] is going to refax order to [place].

## 2015-06-13 NOTE — Telephone Encounter (Signed)
Ronald [name] called from [facility] stating the doctor needs a medical clearance for the pt to discontinue Eliquis 48 hours prior to a prostate biopsy on May 18,2017.  Ronald [name] stated she faxed a request for this on 2/9 and has not heard anything?  Please call her at [phone].

## 2015-07-04 ENCOUNTER — Encounter: Payer: Self-pay | Admitting: Family Medicine

## 2015-07-04 ENCOUNTER — Ambulatory Visit (INDEPENDENT_AMBULATORY_CARE_PROVIDER_SITE_OTHER): Payer: Federal, State, Local not specified - PPO | Admitting: Family Medicine

## 2015-07-04 VITALS — BP 122/80 | HR 66 | Temp 98.7°F | Ht 71.0 in | Wt 231.4 lb

## 2015-07-04 DIAGNOSIS — I10 Essential (primary) hypertension: Secondary | ICD-10-CM | POA: Diagnosis not present

## 2015-07-04 DIAGNOSIS — E1140 Type 2 diabetes mellitus with diabetic neuropathy, unspecified: Secondary | ICD-10-CM

## 2015-07-04 DIAGNOSIS — I481 Persistent atrial fibrillation: Secondary | ICD-10-CM | POA: Diagnosis not present

## 2015-07-04 DIAGNOSIS — I4819 Other persistent atrial fibrillation: Secondary | ICD-10-CM

## 2015-07-04 LAB — POCT GLYCOSYLATED HEMOGLOBIN (HGB A1C): Hemoglobin A1C: 5.3

## 2015-07-04 MED ORDER — TRIAMCINOLONE ACETONIDE 0.1 % EX CREA: 1.0000 "application " | TOPICAL_CREAM | Freq: Two times a day (BID) | CUTANEOUS | Status: DC | PRN

## 2015-07-04 NOTE — Progress Notes (Signed)
Pre visit review using our clinic review tool, if applicable. No additional management support is needed unless otherwise documented below in the visit note. 

## 2015-07-04 NOTE — Patient Instructions (Signed)
Reduce the Glipizide to 5 mg ONCE daily.

## 2015-07-04 NOTE — Progress Notes (Signed)
   Subjective:    Patient ID: Ronald [name], male    DOB: [birth year], 70 y.o.   MRN: [id number]  HPI    Review of Systems     Objective:   Physical Exam        Assessment & Plan:

## 2015-07-04 NOTE — Progress Notes (Addendum)
Subjective:    Patient ID: Ronald [name], male    DOB: 02-14-1946, 70 y.o.   MRN: [id number]  HPI Follow-up type 2 diabetes. Diagnosed within the past year. He was on fairly high-dose of steroids and that is when symptoms presented with blurred vision and urine frequency. Hemoglobin A1c 10.9% back in December. Currently taking metformin and glipizide. Blood sugars much improved. Blurred vision has resolved. No polyuria. No polydipsia. No recent hypoglycemia.  Recent fasting glucose ranging usually between 90 and 100. Has almost completely quit smoking. Other chronic medical problems include history of hypertension, history of prostate cancer, obstructive sleep apnea, metabolic syndrome, hyper lipidemia, gout, atrial flutter. He is followed by cardiology. He has some chronic dyspnea with exertion. No chest pains. Heart rate has been stable.  Medications reviewed. Compliant with all.  Past Medical History  Diagnosis Date  . Hypertension   . Hyperlipidemia   . Gout   . Tinea versicolor   . OSA on CPAP     per study 08-03-2004  Severe OSA  . Allergic rhinitis   . Prostate cancer [facility])     Dx  2014--  stage T1c, Gleason 3+3=6, PSA 6.67--  Active surveillance  . Bilateral hydrocele   . History of colon polyps     tubular adenoma 2013  . Sigmoid diverticulosis   . Cigarette smoker two packs a day or less    Past Surgical History  Procedure Laterality Date  . Colonoscopy  last one 06-07-2011  . Prostate biopsy  05/15/12    Clinically both Lobes  . Tonsillectomy  as child  . Laminectomy and microdiscectomy lumbar spine  12-23-2003    Left L5 -- S1  . Laparoscopic bilateral inguinal hernia repair/  umbilical hernia repair with mesh/  aspiration left hydrocele  07-11-2012  . Transthoracic echocardiogram  05/01/2014    mild LVH; EF 50-55% (cannot measure diastolic Fxn with Afib), Mod LA Dilation, mild MR  . Tee without cardioversion N/A 05/07/2014    Procedure: TRANSESOPHAGEAL  ECHOCARDIOGRAM (TEE);  Surgeon: [name], MD;  Location: [facility] ENDOSCOPY;  Service: Cardiovascular;  Laterality: N/A;  . Cardioversion N/A 05/07/2014    Procedure: CARDIOVERSION;  Surgeon: [name], MD;  Location: [place];  Service: Cardiovascular;  Laterality: N/A;  . Nm myoview ltd  05/18/2014    Low risk study. Normal perfusion: No ischemia or infarction. Mild LV dysfunction - 46% (does not correlate with echocardiographic EF of 50-55%)  . Cardioversion N/A 09/09/2014    Procedure: CARDIOVERSION;  Surgeon: Lelon Perla, MD;  Location: [facility] ENDOSCOPY;  Service: Cardiovascular;  Laterality: N/A;    reports that he has quit smoking. His smoking use included Cigarettes. He has a 55 pack-year smoking history. He has never used smokeless tobacco. He reports that he drinks about 3.6 oz of alcohol per week. He reports that he does not use illicit drugs. family history includes Breast cancer in his mother; Ovarian cancer in his sister. There is no history of Colon cancer, Esophageal cancer, Rectal cancer, or Stomach cancer. No Known Allergies    Review of Systems  Constitutional: Negative for fatigue.  Eyes: Negative for visual disturbance.  Respiratory: Negative for cough, chest tightness and wheezing.   Cardiovascular: Negative for chest pain, palpitations and leg swelling.  Endocrine: Negative for polydipsia and polyuria.  Neurological: Negative for dizziness, syncope, weakness, light-headedness and headaches.       Objective:   Physical Exam  Constitutional: He appears well-developed and well-nourished.  Neck: Neck supple. No thyromegaly present.  Cardiovascular: Normal rate and regular rhythm.   Pulmonary/Chest: Effort normal and breath sounds normal. No respiratory distress. He has no wheezes. He has no rales.  Somewhat diminished breath sounds throughout but clear. No wheezes  Musculoskeletal: He exhibits no edema.  Lymphadenopathy:    He has no cervical adenopathy.           Assessment & Plan:  #1 type 2 diabetes. Recently diagnosed. Symptomatically improved. Recheck A1c. Hopefully, can eventually taper back and possibly off of glipizide A1c greatly improved 5.3%. Reduce glipizide to once daily. Reassess in 4 months and if stable at that time may consider discontinue glipizide altogether  #2 hypertension stable and well controlled. Continue weight loss efforts  #3 atrial fibrillation. Currently appears to be in sinus rhythm. Continue follow-up with cardiology.

## 2015-07-10 ENCOUNTER — Other Ambulatory Visit: Payer: Self-pay | Admitting: Adult Health

## 2015-08-15 DIAGNOSIS — C61 Malignant neoplasm of prostate: Secondary | ICD-10-CM | POA: Diagnosis not present

## 2015-08-15 DIAGNOSIS — Z0000 Encounter for general adult medical examination without abnormal findings: Secondary | ICD-10-CM | POA: Diagnosis not present

## 2015-08-15 DIAGNOSIS — R972 Elevated prostate specific antigen [PSA]: Secondary | ICD-10-CM | POA: Diagnosis not present

## 2015-08-16 ENCOUNTER — Other Ambulatory Visit: Payer: Self-pay | Admitting: Urology

## 2015-08-22 DIAGNOSIS — Z0000 Encounter for general adult medical examination without abnormal findings: Secondary | ICD-10-CM | POA: Diagnosis not present

## 2015-08-22 DIAGNOSIS — R351 Nocturia: Secondary | ICD-10-CM | POA: Diagnosis not present

## 2015-08-22 DIAGNOSIS — C61 Malignant neoplasm of prostate: Secondary | ICD-10-CM | POA: Diagnosis not present

## 2015-09-02 ENCOUNTER — Other Ambulatory Visit: Payer: Self-pay | Admitting: Family Medicine

## 2015-09-15 ENCOUNTER — Other Ambulatory Visit (HOSPITAL_COMMUNITY): Payer: Self-pay | Admitting: *Deleted

## 2015-09-15 MED ORDER — AMIODARONE HCL 200 MG PO TABS: 200.0000 mg | ORAL_TABLET | Freq: Every day | ORAL | Status: DC

## 2015-09-16 ENCOUNTER — Other Ambulatory Visit: Payer: Self-pay | Admitting: *Deleted

## 2015-09-16 MED ORDER — GLIPIZIDE 5 MG PO TABS: 5.0000 mg | ORAL_TABLET | Freq: Every day | ORAL | Status: DC

## 2015-09-20 ENCOUNTER — Encounter (HOSPITAL_BASED_OUTPATIENT_CLINIC_OR_DEPARTMENT_OTHER): Payer: Self-pay | Admitting: *Deleted

## 2015-09-20 NOTE — Progress Notes (Signed)
NPO AFTER MN WITH EXCEPTION CLEAR LIQUIDS UNTIL 0730 (NO CREAM/  MILD PRODUCTS.  ARRIVE AT 1200.  NEEDS ISTAT.  CURRENT EKG IN CHART AND EPIC.  WILL TAKE AM MEDS DOS W/ SIPS OF WATER WITH EXCEPTION NO METFORMIN OR LASIX.

## 2015-09-22 ENCOUNTER — Telehealth: Payer: Self-pay | Admitting: Cardiology

## 2015-09-22 NOTE — Telephone Encounter (Signed)
Called patient. He stated his weight on last Friday was 221 lbs, yesterday was 228lbs, and today down to 226lbs. He said his lower legs/feet have felt more swollen and tight than his usual since last Friday. Today, when he got up the swelling had gone down since yesterday. He does not feel like he is SOB but more so describes it as "feels like I cannot take a deep breath at times."  He said it does not bother him and only lasts a couple seconds a couple times a day. He thinks its more of an issue of sinus congestion that he always has associated with his history of smoking per patient. He says he has been "eating good" the past week and staying away from foods/drinks he should not eat. He said he takes 1-2 (40 mg) lasix pills each day. Today he has not taken his lasix.  I encouraged him to go ahead and take 2 (40mg ) lasix pills this morning and that I will route this to [name] for further advice. Denies CP and palpitations. He says his HR has been in the 60's-70's per his Fitbit and does not feel he is in A. Fib.  Patient is also concerned as he has hydrocele surgery scheduled for Monday. He wants to make sure this does not affect him for his surgery and that he can still have it. I encouraged him to call his surgeon to discuss as well.

## 2015-09-22 NOTE — Telephone Encounter (Signed)
Returned call to patient and gave him [name] recommendations. He verbalized understanding and said thank you.

## 2015-09-22 NOTE — Telephone Encounter (Signed)
Pt c/o swelling: STAT is pt has developed SOB within 24 hours  1. How long have you been experiencing swelling? Fri-6/9  2. Where is the swelling located? Feet- and tightness in legs   3.  Are you currently taking a "fluid pill"? Lasix 2x day   4.  Are you currently SOB? No- c/o SOB at times since Fri 6/9  5.  Have you traveled recently? No    Pt wife called to speak w/ rn- stated pt has gained 10 lbs since Fri 6/9. Please call back and discuss.

## 2015-09-22 NOTE — Telephone Encounter (Signed)
Would take 40mg  bid until wgt closer to 221.  If not better by Monday - needs to be seen. If it gets worse, may need IV Lasix.  Glenetta Hew, MD

## 2015-09-22 NOTE — Telephone Encounter (Signed)
Returned call to patient's wife, ok per DPR. She said pt's weight has increased since Friday, 09/16/15 by nearly 10 lb. She said his swelling on his feet and ankles is more than his usual and he felt tight. I asked if I could speak with the patient, she said she was at work but to call his cell phone. I will call pt to get additional information from him.

## 2015-09-23 NOTE — Anesthesia Preprocedure Evaluation (Addendum)
Anesthesia Evaluation  Patient identified by MRN, date of birth, ID band Patient awake    Reviewed: Allergy & Precautions, NPO status , Patient's Chart, lab work & pertinent test results  Airway Mallampati: II       Dental  (+) Teeth Intact, Dental Advisory Given   Pulmonary neg pulmonary ROS, sleep apnea and Continuous Positive Airway Pressure Ventilation , COPD, Current Smoker,    breath sounds clear to auscultation       Cardiovascular hypertension, Pt. on medications negative cardio ROS  + dysrhythmias Atrial Fibrillation  Rhythm:Regular  ECHO 2016 EF 55%, Atrial Fib Cardioverted 2016, Has had resent weight gain and increased swelling in legs, was told to increase his lasix dose     Neuro/Psych negative neurological ROS  negative psych ROS   GI/Hepatic negative GI ROS, Neg liver ROS,   Endo/Other  negative endocrine ROSdiabetes, Type 2, Oral Hypoglycemic AgentsObesity BMI 31  Renal/GU negative Renal ROS  negative genitourinary   Musculoskeletal negative musculoskeletal ROS (+)   Abdominal   Peds negative pediatric ROS (+)  Hematology negative hematology ROS (+)   Anesthesia Other Findings   Reproductive/Obstetrics negative OB ROS                          Anesthesia Physical Anesthesia Plan  ASA: III  Anesthesia Plan: General   Post-op Pain Management:    Induction: Intravenous  Airway Management Planned: LMA  Additional Equipment:   Intra-op Plan:   Post-operative Plan:   Informed Consent: I have reviewed the patients History and Physical, chart, labs and discussed the procedure including the risks, benefits and alternatives for the proposed anesthesia with the patient or authorized representative who has indicated his/her understanding and acceptance.     Plan Discussed with:   Anesthesia Plan Comments: (To stop Eliquis 6/13.  New onset increased leg swleling, (told to  increase lasix dose, weight had increased from 221 to 228, need to check that this has resolved) Check ISTAT, Check for AF.  Not in AF now, weight down to 223, minimal leg swelling and reasonable exercise tolerance)       Anesthesia Quick Evaluation

## 2015-09-26 ENCOUNTER — Encounter (HOSPITAL_BASED_OUTPATIENT_CLINIC_OR_DEPARTMENT_OTHER): Payer: Self-pay

## 2015-09-26 ENCOUNTER — Ambulatory Visit (HOSPITAL_BASED_OUTPATIENT_CLINIC_OR_DEPARTMENT_OTHER)
Admission: RE | Admit: 2015-09-26 | Discharge: 2015-09-26 | Disposition: A | Discharge status: Home / Self Care | Payer: Federal, State, Local not specified - PPO | Source: Ambulatory Visit | Attending: Urology | Admitting: Urology

## 2015-09-26 ENCOUNTER — Ambulatory Visit (HOSPITAL_BASED_OUTPATIENT_CLINIC_OR_DEPARTMENT_OTHER): Payer: Federal, State, Local not specified - PPO | Admitting: Anesthesiology

## 2015-09-26 ENCOUNTER — Encounter (HOSPITAL_BASED_OUTPATIENT_CLINIC_OR_DEPARTMENT_OTHER)
Admission: RE | Disposition: A | Discharge status: Home / Self Care | Payer: Self-pay | Source: Ambulatory Visit | Attending: Urology

## 2015-09-26 ENCOUNTER — Telehealth: Payer: Self-pay | Admitting: Cardiology

## 2015-09-26 DIAGNOSIS — E119 Type 2 diabetes mellitus without complications: Secondary | ICD-10-CM | POA: Insufficient documentation

## 2015-09-26 DIAGNOSIS — Z7901 Long term (current) use of anticoagulants: Secondary | ICD-10-CM | POA: Insufficient documentation

## 2015-09-26 DIAGNOSIS — Z79899 Other long term (current) drug therapy: Secondary | ICD-10-CM | POA: Insufficient documentation

## 2015-09-26 DIAGNOSIS — I10 Essential (primary) hypertension: Secondary | ICD-10-CM | POA: Insufficient documentation

## 2015-09-26 DIAGNOSIS — N433 Hydrocele, unspecified: Secondary | ICD-10-CM | POA: Diagnosis not present

## 2015-09-26 DIAGNOSIS — F17210 Nicotine dependence, cigarettes, uncomplicated: Secondary | ICD-10-CM | POA: Diagnosis not present

## 2015-09-26 DIAGNOSIS — Z7984 Long term (current) use of oral hypoglycemic drugs: Secondary | ICD-10-CM | POA: Diagnosis not present

## 2015-09-26 DIAGNOSIS — E669 Obesity, unspecified: Secondary | ICD-10-CM | POA: Diagnosis not present

## 2015-09-26 DIAGNOSIS — G4733 Obstructive sleep apnea (adult) (pediatric): Secondary | ICD-10-CM | POA: Insufficient documentation

## 2015-09-26 DIAGNOSIS — E785 Hyperlipidemia, unspecified: Secondary | ICD-10-CM | POA: Insufficient documentation

## 2015-09-26 DIAGNOSIS — I481 Persistent atrial fibrillation: Secondary | ICD-10-CM | POA: Insufficient documentation

## 2015-09-26 DIAGNOSIS — J449 Chronic obstructive pulmonary disease, unspecified: Secondary | ICD-10-CM | POA: Diagnosis not present

## 2015-09-26 DIAGNOSIS — Z6830 Body mass index (BMI) 30.0-30.9, adult: Secondary | ICD-10-CM | POA: Insufficient documentation

## 2015-09-26 DIAGNOSIS — G4730 Sleep apnea, unspecified: Secondary | ICD-10-CM | POA: Diagnosis not present

## 2015-09-26 DIAGNOSIS — C61 Malignant neoplasm of prostate: Secondary | ICD-10-CM | POA: Diagnosis not present

## 2015-09-26 HISTORY — DX: Personal history of other diseases of the musculoskeletal system and connective tissue: Z87.39

## 2015-09-26 HISTORY — DX: Long term (current) use of anticoagulants: Z79.01

## 2015-09-26 HISTORY — DX: Type 2 diabetes mellitus without complications: E11.9

## 2015-09-26 HISTORY — DX: Respiratory bronchiolitis interstitial lung disease: J84.115

## 2015-09-26 HISTORY — DX: Emphysema, unspecified: J43.9

## 2015-09-26 HISTORY — PX: HYDROCELE EXCISION: SHX482

## 2015-09-26 HISTORY — DX: Other persistent atrial fibrillation: I48.19

## 2015-09-26 HISTORY — DX: Other seasonal allergic rhinitis: J30.2

## 2015-09-26 HISTORY — DX: Dyspnea, unspecified: R06.00

## 2015-09-26 LAB — POCT I-STAT 4, (NA,K, GLUC, HGB,HCT)
GLUCOSE: 123 mg/dL — AB (ref 65–99)
HEMATOCRIT: 43 % (ref 39.0–52.0)
Hemoglobin: 14.6 g/dL (ref 13.0–17.0)
Potassium: 3.6 mmol/L (ref 3.5–5.1)
Sodium: 141 mmol/L (ref 135–145)

## 2015-09-26 LAB — GLUCOSE, CAPILLARY: GLUCOSE-CAPILLARY: 135 mg/dL — AB (ref 65–99)

## 2015-09-26 SURGERY — HYDROCELECTOMY
Anesthesia: General | Laterality: Bilateral

## 2015-09-26 MED ORDER — CEFAZOLIN SODIUM-DEXTROSE 2-4 GM/100ML-% IV SOLN
2.0000 g | INTRAVENOUS | Status: AC
  Administered 2015-09-26: 2 g via INTRAVENOUS
  Filled 2015-09-26: qty 100

## 2015-09-26 MED ORDER — LIDOCAINE 2% (20 MG/ML) 5 ML SYRINGE
INTRAMUSCULAR | Status: DC | PRN
  Administered 2015-09-26: 100 mg via INTRAVENOUS

## 2015-09-26 MED ORDER — FENTANYL CITRATE (PF) 100 MCG/2ML IJ SOLN
INTRAMUSCULAR | Status: DC | PRN
  Administered 2015-09-26 (×8): 25 ug via INTRAVENOUS

## 2015-09-26 MED ORDER — ONDANSETRON HCL 4 MG/2ML IJ SOLN
INTRAMUSCULAR | Status: AC
  Filled 2015-09-26: qty 2

## 2015-09-26 MED ORDER — CEFAZOLIN SODIUM 1-5 GM-% IV SOLN
1.0000 g | INTRAVENOUS | Status: DC
  Filled 2015-09-26: qty 50

## 2015-09-26 MED ORDER — PROPOFOL 10 MG/ML IV BOLUS
INTRAVENOUS | Status: DC | PRN
  Administered 2015-09-26: 200 mg via INTRAVENOUS

## 2015-09-26 MED ORDER — FENTANYL CITRATE (PF) 100 MCG/2ML IJ SOLN
INTRAMUSCULAR | Status: AC
  Filled 2015-09-26: qty 2

## 2015-09-26 MED ORDER — LACTATED RINGERS IV SOLN
INTRAVENOUS | Status: DC
  Administered 2015-09-26 (×2): via INTRAVENOUS
  Filled 2015-09-26: qty 1000

## 2015-09-26 MED ORDER — CEFAZOLIN SODIUM-DEXTROSE 2-4 GM/100ML-% IV SOLN
INTRAVENOUS | Status: AC
  Filled 2015-09-26: qty 100

## 2015-09-26 MED ORDER — FENTANYL CITRATE (PF) 100 MCG/2ML IJ SOLN
25.0000 ug | INTRAMUSCULAR | Status: DC | PRN
  Filled 2015-09-26: qty 1

## 2015-09-26 MED ORDER — LIDOCAINE HCL (CARDIAC) 20 MG/ML IV SOLN
INTRAVENOUS | Status: AC
Start: 2015-09-26 — End: 2015-09-26
  Filled 2015-09-26: qty 5

## 2015-09-26 MED ORDER — PROMETHAZINE HCL 25 MG/ML IJ SOLN
6.2500 mg | INTRAMUSCULAR | Status: DC | PRN
  Filled 2015-09-26: qty 1

## 2015-09-26 MED ORDER — SULFAMETHOXAZOLE-TRIMETHOPRIM 800-160 MG PO TABS: 1.0000 | ORAL_TABLET | Freq: Two times a day (BID) | ORAL | Status: DC

## 2015-09-26 MED ORDER — DEXAMETHASONE SODIUM PHOSPHATE 10 MG/ML IJ SOLN
INTRAMUSCULAR | Status: DC | PRN
  Administered 2015-09-26: 5 mg via INTRAVENOUS

## 2015-09-26 MED ORDER — ONDANSETRON HCL 4 MG/2ML IJ SOLN
INTRAMUSCULAR | Status: DC | PRN
  Administered 2015-09-26: 4 mg via INTRAVENOUS

## 2015-09-26 MED ORDER — MEPERIDINE HCL 25 MG/ML IJ SOLN
6.2500 mg | INTRAMUSCULAR | Status: DC | PRN
  Filled 2015-09-26: qty 1

## 2015-09-26 MED ORDER — PROPOFOL 10 MG/ML IV BOLUS
INTRAVENOUS | Status: AC
  Filled 2015-09-26: qty 20

## 2015-09-26 MED ORDER — OXYCODONE-ACETAMINOPHEN 10-325 MG PO TABS: 1.0000 | ORAL_TABLET | ORAL | Status: DC | PRN

## 2015-09-26 MED ORDER — DEXAMETHASONE SODIUM PHOSPHATE 10 MG/ML IJ SOLN
INTRAMUSCULAR | Status: AC
  Filled 2015-09-26: qty 1

## 2015-09-26 MED ORDER — BUPIVACAINE HCL (PF) 0.25 % IJ SOLN
INTRAMUSCULAR | Status: DC | PRN
  Administered 2015-09-26: 10 mL

## 2015-09-26 SURGICAL SUPPLY — 43 items
APPLICATOR COTTON TIP 6IN STRL (MISCELLANEOUS) IMPLANT
BAG URINE DRAINAGE (UROLOGICAL SUPPLIES) IMPLANT
BLADE CLIPPER SENSICLIP SURGIC (BLADE) IMPLANT
BLADE SURG 15 STRL LF DISP TIS (BLADE) ×1 IMPLANT
BLADE SURG 15 STRL SS (BLADE) ×1
CATH FOLEY 2WAY SLVR  5CC 16FR (CATHETERS)
CATH FOLEY 2WAY SLVR 5CC 16FR (CATHETERS) IMPLANT
COVER BACK TABLE 60X90IN (DRAPES) ×2 IMPLANT
COVER MAYO STAND STRL (DRAPES) ×2 IMPLANT
DISSECTOR ROUND CHERRY 3/8 STR (MISCELLANEOUS) IMPLANT
DRAIN PENROSE 18X1/2 LTX STRL (DRAIN) ×2 IMPLANT
DRAPE LAPAROTOMY 100X72 PEDS (DRAPES) ×2 IMPLANT
DRSG KUZMA FLUFF (GAUZE/BANDAGES/DRESSINGS) ×2 IMPLANT
DRSG TEGADERM 4X4.75 (GAUZE/BANDAGES/DRESSINGS) IMPLANT
ELECT NEEDLE BLADE 2-5/6 (NEEDLE) ×2 IMPLANT
ELECT REM PT RETURN 9FT ADLT (ELECTROSURGICAL) ×2
ELECTRODE REM PT RTRN 9FT ADLT (ELECTROSURGICAL) ×1 IMPLANT
GLOVE BIO SURGEON STRL SZ8 (GLOVE) ×2 IMPLANT
GOWN STRL REUS W/ TWL LRG LVL3 (GOWN DISPOSABLE) ×1 IMPLANT
GOWN STRL REUS W/ TWL XL LVL3 (GOWN DISPOSABLE) ×1 IMPLANT
GOWN STRL REUS W/TWL LRG LVL3 (GOWN DISPOSABLE) ×1
GOWN STRL REUS W/TWL XL LVL3 (GOWN DISPOSABLE) ×1
KIT ROOM TURNOVER WOR (KITS) ×2 IMPLANT
LIQUID BAND (GAUZE/BANDAGES/DRESSINGS) ×2 IMPLANT
MANIFOLD NEPTUNE II (INSTRUMENTS) IMPLANT
NEEDLE HYPO 25X1 1.5 SAFETY (NEEDLE) ×2 IMPLANT
NS IRRIG 500ML POUR BTL (IV SOLUTION) IMPLANT
PACK BASIN DAY SURGERY FS (CUSTOM PROCEDURE TRAY) ×2 IMPLANT
PENCIL BUTTON HOLSTER BLD 10FT (ELECTRODE) ×2 IMPLANT
SPONGE GAUZE 4X4 12PLY STER LF (GAUZE/BANDAGES/DRESSINGS) ×2 IMPLANT
SUPPORT SCROTAL LG STRP (MISCELLANEOUS) ×2 IMPLANT
SUT ETHILON 4 0 PS 2 18 (SUTURE) ×2 IMPLANT
SUT MNCRL AB 4-0 PS2 18 (SUTURE) ×2 IMPLANT
SUT SILK 0 SH 30 (SUTURE) IMPLANT
SUT VIC AB 2-0 SH 27 (SUTURE) ×4
SUT VIC AB 2-0 SH 27XBRD (SUTURE) ×4 IMPLANT
SYR 30ML LL (SYRINGE) IMPLANT
SYR CONTROL 10ML LL (SYRINGE) ×2 IMPLANT
TOWEL OR 17X24 6PK STRL BLUE (TOWEL DISPOSABLE) ×4 IMPLANT
TRAY DSU PREP LF (CUSTOM PROCEDURE TRAY) ×2 IMPLANT
TUBE CONNECTING 12X1/4 (SUCTIONS) IMPLANT
WATER STERILE IRR 500ML POUR (IV SOLUTION) IMPLANT
YANKAUER SUCT BULB TIP NO VENT (SUCTIONS) IMPLANT

## 2015-09-26 NOTE — Anesthesia Procedure Notes (Signed)
Procedure Name: LMA Insertion Date/Time: 09/26/2015 2:02 PM Performed by: Justice Rocher Pre-anesthesia Checklist: Patient identified, Emergency Drugs available, Suction available and Patient being monitored Patient Re-evaluated:Patient Re-evaluated prior to inductionOxygen Delivery Method: Circle system utilized Preoxygenation: Pre-oxygenation with 100% oxygen Intubation Type: IV induction Ventilation: Mask ventilation without difficulty LMA: LMA inserted LMA Size: 5.0 Number of attempts: 1 Airway Equipment and Method: Bite block Placement Confirmation: positive ETCO2 Tube secured with: Tape Dental Injury: Teeth and Oropharynx as per pre-operative assessment

## 2015-09-26 NOTE — Discharge Instructions (Signed)
HOME CARE INSTRUCTIONS FOR SCROTAL PROCEDURES ° °Wound Care & Hygiene: °You may apply an ice bag to the scrotum for the first 24 hours.  This may help decrease swelling and soreness.  You may have a dressing held in place by an athletic supporter.  You may remove the dressing in 24 hours and shower in 48 hours.  Continue to use the athletic supporter or tight briefs for at least a week. °Activity: °Rest today - not necessarily flat bed rest.  Just take it easy.  You should not do strenuous activities until your follow-up visit with your doctor.  You may resume light activity in 48 hours. ° °Return to Work: ° °Your doctor will advise you of this depending on the type of work you do ° °Diet: °Drink liquids or eat a light diet this evening.  You may resume a regular diet tomorrow. ° °General Expectations: °You may have a small amount of bleeding.  The scrotum may be swollen or bruised for about a week. ° °Call your Doctor if these occur: ° -persistent or heavy bleeding ° -temperature of 101 degrees or more ° -severe pain, not relieved by your pain medication ° °Return to Doctor's Office:  °Call to set up and appointment. ° °Patient Signature:  __________________________________________________ ° °Nurse's Signature:  __________________________________________________ ° °Post Anesthesia Home Care Instructions ° °Activity: °Get plenty of rest for the remainder of the day. A responsible adult should stay with you for 24 hours following the procedure.  °For the next 24 hours, DO NOT: °-Drive a car °-Operate machinery °-Drink alcoholic beverages °-Take any medication unless instructed by your physician °-Make any legal decisions or sign important papers. ° °Meals: °Start with liquid foods such as gelatin or soup. Progress to regular foods as tolerated. Avoid greasy, spicy, heavy foods. If nausea and/or vomiting occur, drink only clear liquids until the nausea and/or vomiting subsides. Call your physician if vomiting  continues. ° °Special Instructions/Symptoms: °Your throat may feel dry or sore from the anesthesia or the breathing tube placed in your throat during surgery. If this causes discomfort, gargle with warm salt water. The discomfort should disappear within 24 hours. ° °If you had a scopolamine patch placed behind your ear for the management of post- operative nausea and/or vomiting: ° °1. The medication in the patch is effective for 72 hours, after which it should be removed.  Wrap patch in a tissue and discard in the trash. Wash hands thoroughly with soap and water. °2. You may remove the patch earlier than 72 hours if you experience unpleasant side effects which may include dry mouth, dizziness or visual disturbances. °3. Avoid touching the patch. Wash your hands with soap and water after contact with the patch. °  ° °

## 2015-09-26 NOTE — Telephone Encounter (Signed)
Spoke with pt wife, the patient tolerated his surgery well. They ask the wife to call us and ask if he can stay off the eliquis until Saturday or Sunday. They are concerned about possible hematoma. Will forward to dr harding to advise

## 2015-09-26 NOTE — Transfer of Care (Signed)
Immediate Anesthesia Transfer of Care Note  Patient: Ronald [name]  Procedure(s) Performed: Procedure(s) (LRB): HYDROCELECTOMY ADULT (Bilateral)  Patient Location: PACU  Anesthesia Type: General  Level of Consciousness: awake, sedated, patient cooperative and responds to stimulation  Airway & Oxygen Therapy: Patient Spontanous Breathing and Patient connected to face mask oxygen  Post-op Assessment: Report given to PACU RN, Post -op Vital signs reviewed and stable and Patient moving all extremities  Post vital signs: Reviewed and stable  Complications: No apparent anesthesia complications

## 2015-09-26 NOTE — H&P (Signed)
Urology Admission H&P  Chief Complaint: bilateral scrotal swelling  History of Present Illness: Ronald [name] is a 70yo seen today for bilateral scrotal swelling and was found to have bilateral hydroceles. He has difficulty walking and pain with sitting  Past Medical History  Diagnosis Date  . Hypertension   . Hyperlipidemia   . Tinea versicolor   . OSA on CPAP     per study 08-03-2004  Severe OSA  . Allergic rhinitis   . History of colon polyps     tubular adenoma 2013  . Sigmoid diverticulosis   . Bilateral hydrocele   . Persistent atrial fibrillation [facility])     cardiologist --  paula ross--  post cardioversion 05-07-2014  . Seasonal allergies   . Type 2 diabetes mellitus ([place])   . Nocturia     flomax has improved symptoms  . Urgency of urination   . Prostate cancer [facility]) urologsit-  dr Alyson Ingles    Dx  2014--  stage T1c, Gleason 3+3=6, PSA 6.67--  Active surveillance  . History of gout     last episode early June 2017-- (feet)  . Wears glasses   . Dyspnea     occasional per pt  . Anticoagulant long-term use     eliquis  . 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 COPD with emphysema [facility])     pulmologist-  dr Halford Chessman   Past Surgical History  Procedure Laterality Date  . Colonoscopy  last one 06-07-2011  . Prostate biopsy  05/15/12    Clinically both Lobes  . Tonsillectomy  as child  . Laminectomy and microdiscectomy lumbar spine  12-23-2003    Left L5 -- S1  . Laparoscopic bilateral inguinal hernia repair/  umbilical hernia repair with mesh/  aspiration left hydrocele  07-11-2012  . Transthoracic echocardiogram  05/01/2014    mild LVH; EF 50-55% (cannot measure diastolic Fxn with Afib), Mod LA Dilation, mild Ronald  . Tee without cardioversion N/A 05/07/2014    Procedure: TRANSESOPHAGEAL ECHOCARDIOGRAM (TEE);  Surgeon: [name], MD;  Location: [facility] ENDOSCOPY;  Service: Cardiovascular;  Laterality: N/A;   mild  atherosclerosis plaque of aorta,  mild AR, Ronald, and TR,  no cardiac source of emboli  . Cardioversion N/A 05/07/2014    Procedure: CARDIOVERSION;  Surgeon: [name], MD;  Location: [facility] ENDOSCOPY;  Service: Cardiovascular;  Laterality: N/A;  . Nm myoview ltd  05/18/2014    Low risk study. Normal perfusion: No ischemia or infarction. Mild LV dysfunction - 46% (does not correlate with echocardiographic EF of 50-55%)  . Cardioversion N/A 09/09/2014    Procedure: CARDIOVERSION;  Surgeon: Lelon Perla, MD;  Location: [facility] ENDOSCOPY;  Service: Cardiovascular;  Laterality: N/A;  successfully    Home Medications:  Prescriptions prior to admission  Medication Sig Dispense Refill Last Dose  . acetaminophen (TYLENOL) 500 MG tablet Take 500 mg by mouth every 6 (six) hours as needed for mild pain or moderate pain.   Past Month at Unknown time  . amiodarone (PACERONE) 200 MG tablet Take 1 tablet (200 mg total) by mouth daily. (Patient taking differently: Take 200 mg by mouth every morning. ) 90 tablet 3 09/26/2015 at 0800  . apixaban (ELIQUIS) 5 MG TABS tablet Take 1 tablet (5 mg total) by mouth 2 (two) times daily. 180 tablet 3 09/20/2015  . atorvastatin (LIPITOR) 10 MG tablet Take 1 tablet (10 mg total) by mouth every evening.  09/26/2015 at 0800  . diltiazem (CARDIZEM CD) 360 MG 24 hr capsule TAKE 1 CAPSULE DAILY (Patient taking differently: TAKE 1 CAPSULE DAILY-- takes in pm) 90 capsule 1 09/25/2015 at 2000  . furosemide (LASIX) 40 MG tablet Take 1-2 tablets daily as needed. (Patient taking differently: Take 40 mg by mouth. Take 1-2 tablets daily as needed.) 60 tablet 11 09/25/2015 at 2000  . glucose blood test strip Use as instructed 100 each 0 09/26/2015 at 0800  . metFORMIN (GLUCOPHAGE) 500 MG tablet TAKE 1 TABLET (500 MG TOTAL) BY MOUTH 2 (TWO) TIMES DAILY WITH A MEAL. 90 tablet 2 09/25/2015 at 2000  . metoprolol tartrate (LOPRESSOR) 25 MG tablet Take 1 tablet (25 mg total) by mouth 2 (two) times daily. 180  tablet 1 09/26/2015 at 0800  . nicotine (NICOTROL) 10 MG inhaler Inhale 1 cartridge (1 continuous puffing total) into the lungs as needed for smoking cessation. 42 each 1 Past Month at Unknown time  . tamsulosin (FLOMAX) 0.4 MG CAPS capsule Take 0.4 mg by mouth daily after supper.   5 09/25/2015 at 2000  . triamcinolone cream (KENALOG) 0.1 % Apply 1 application topically 2 (two) times daily as needed (for rash). 30 g 1 Past Month at Unknown time  . fluticasone (FLONASE) 50 MCG/ACT nasal spray Place 2 sprays into both nostrils daily as needed for allergies or rhinitis.   Unknown at Unknown time   Allergies: No Known Allergies  Family History  Problem Relation Age of Onset  . Stroke      Unknown   . Colon cancer Neg Hx   . Esophageal cancer Neg Hx   . Rectal cancer Neg Hx   . Stomach cancer Neg Hx   . Breast cancer Mother   . Ovarian cancer Sister    Social History:  reports that he has been smoking Cigarettes.  He has a 55 pack-year smoking history. He has never used smokeless tobacco. He reports that he drinks about 12.0 oz of alcohol per week. He reports that he does not use illicit drugs.  Review of Systems  All other systems reviewed and are negative.   Physical Exam:  Vital signs in last 24 hours: Temp:  [98.4 F (36.9 C)] 98.4 F (36.9 C) (06/19 1155) Pulse Rate:  [60] 60 (06/19 1155) Resp:  [16] 16 (06/19 1155) BP: (137)/(74) 137/74 mmHg (06/19 1155) SpO2:  [98 %] 98 % (06/19 1155) Weight:  [102.059 kg (225 lb)] 102.059 kg (225 lb) (06/19 1155) Physical Exam  Constitutional: He is oriented to person, place, and time. He appears well-developed and well-nourished.  HENT:  Head: Normocephalic and atraumatic.  Eyes: EOM are normal. Pupils are equal, round, and reactive to light.  Neck: Normal range of motion. No thyromegaly present.  Cardiovascular: Normal rate and regular rhythm.   Respiratory: Effort normal. No respiratory distress. He has no wheezes. He has no rales. He  exhibits no tenderness.  GI: Soft. He exhibits no distension.  Musculoskeletal: Normal range of motion.  Neurological: He is alert and oriented to person, place, and time.  Skin: Skin is warm and dry.  Psychiatric: He has a normal mood and affect. His behavior is normal. Judgment and thought content normal.    Laboratory Data:  Results for orders placed or performed during the hospital encounter of 09/26/15 (from the past 24 hour(s))  I-STAT 4, (NA,K, GLUC, HGB,HCT)     Status: Abnormal   Collection Time: 09/26/15 12:32 PM  Result Value Ref Range  Sodium 141 135 - 145 mmol/L   Potassium 3.6 3.5 - 5.1 mmol/L   Glucose, Bld 123 (H) 65 - 99 mg/dL   HCT 43.0 39.0 - 52.0 %   Hemoglobin 14.6 13.0 - 17.0 g/dL   No results found for this or any previous visit (from the past 240 hour(s)). Creatinine: No results for input(s): CREATININE in the last 168 hours. Baseline Creatinine: unknwon  Impression/Assessment:  70yo with bilateral hydroceles  Plan:  The risks/benefits/alternatives to bilateral hydrocelectomy was explained to the patient and he understands and wishes to proceed with surgery  Nicolette Bang 09/26/2015, 1:40 PM

## 2015-09-26 NOTE — Anesthesia Postprocedure Evaluation (Signed)
Anesthesia Post Note  Patient: Ronald [name]  Procedure(s) Performed: Procedure(s) (LRB): HYDROCELECTOMY ADULT (Bilateral)  Patient location during evaluation: PACU Anesthesia Type: General Level of consciousness: awake and alert Pain management: pain level controlled Vital Signs Assessment: post-procedure vital signs reviewed and stable Respiratory status: spontaneous breathing, nonlabored ventilation, respiratory function stable and patient connected to nasal cannula oxygen Cardiovascular status: blood pressure returned to baseline and stable Postop Assessment: no signs of nausea or vomiting Anesthetic complications: no    Last Vitals:  Filed Vitals:   09/26/15 1615 09/26/15 1622  BP: 124/79   Pulse: 67 65  Temp:    Resp: 21 19    Last Pain: There were no vitals filed for this visit.               Montez Hageman

## 2015-09-26 NOTE — Telephone Encounter (Signed)
New Message  Pt wife called request a call back to determine if the pt can stay off of eliquis until at least SAT or Sun. The last dose taken  09/22/2015. Please call back to discuss.

## 2015-09-26 NOTE — Telephone Encounter (Signed)
Holding ELIQUIS is fine. If there is a risk of bleeding, and amount is higher risk than him having the potential of having stroke.

## 2015-09-26 NOTE — Op Note (Signed)
Preoperative diagnosis: Bilateral hydroceles  Postoperative diagnosis: Same  Procedure: 1. Bilateral hydrocelectomy  Attending: Nicolette Bang, MD  Anesthesia: General  History of blood loss: 10cc  Antibiotics: ancef  Drains: bilateral scrotal penrose draine  Specimens: 1. Right and left hydrocele sac   Findings: large bilateral hydroceles  Indications: Patient is a 70 year old male with a history of bilateral hydroceles that were growing in size and causing him pain with walking.  We discussed the treatment options including observation versus excision after discussing treatment options he proceed with excision.   Procedure in detail: Prior to procedure consent was obtained.  Patient was brought to the operating room and a brief timeout was done to ensure correct patient, correct procedure, correct site.  General anesthesia was administered and patient was placed in supine position.  His genitalia was then prepped and draped in usual sterile fashion.  A 4 cm incision was made in the right hemiscrotum.  We dissected down to the tunica and then incised the tunica. A large hydrocele was encountered and was drained. We then excised the hydrocele sac and then over sewed the edge with 2-0 Vicryl in a running fashion. We then returned the testis to the right hemiscrotum and closed the overlying dartos with 3-0 vicryl in a running fashion. The skin was then closed with 4-0 monocryl in a running fashion. We then turned our attention to the left side.  A 4 cm incision was made in the left hemiscrotum.  We dissected down to the tunica and then incised the tunica. A large hydrocele was encountered and was drained. We then excised the hydrocele sac and then over sewed the edge with 2-0 Vicryl in a running fashion. We then returned the testis to the left hemiscrotum and closed the overlying dartos with 3-0 vicryl in a running fashion. The skin was then closed with 4-0 monocryl in a running fashion.  Dermabond was placed on the incisions.  A dressing was then applied to the incision.  We then placed a scrotal fluff and this then concluded the procedure which was well tolerated by the patient.  Complications: None  Condition: Stable, extubated, transferred to PACU.  Plan: Patient is to be discharged home.  He is to follow up in 1 weeks for drain removal

## 2015-09-27 ENCOUNTER — Encounter (HOSPITAL_BASED_OUTPATIENT_CLINIC_OR_DEPARTMENT_OTHER): Payer: Self-pay | Admitting: Urology

## 2015-09-28 NOTE — Telephone Encounter (Signed)
Spoke with pt wife, aware of dr harding's recommendations. 

## 2015-10-24 ENCOUNTER — Ambulatory Visit (INDEPENDENT_AMBULATORY_CARE_PROVIDER_SITE_OTHER): Payer: Federal, State, Local not specified - PPO | Admitting: Family Medicine

## 2015-10-24 ENCOUNTER — Encounter: Payer: Self-pay | Admitting: Family Medicine

## 2015-10-24 VITALS — BP 108/70 | HR 62 | Temp 98.2°F | Ht 71.0 in | Wt 224.0 lb

## 2015-10-24 DIAGNOSIS — E1140 Type 2 diabetes mellitus with diabetic neuropathy, unspecified: Secondary | ICD-10-CM

## 2015-10-24 LAB — POCT GLYCOSYLATED HEMOGLOBIN (HGB A1C): HEMOGLOBIN A1C: 6.4

## 2015-10-24 MED ORDER — GLUCOSE BLOOD VI STRP
ORAL_STRIP | Status: DC
Start: 2015-10-24 — End: 2016-07-05

## 2015-10-24 MED ORDER — ACCU-CHEK SOFT TOUCH LANCETS MISC: Status: DC

## 2015-10-24 NOTE — Progress Notes (Signed)
Subjective:     Patient ID: Ronald [name], male   DOB: 02-14-1946, 70 y.o.   MRN: [id number]  HPI Patient seen for medical follow-up Recent hydrocele repair and that has gone well. Feels more comfortable with ambulation and sitting.  Type 2 diabetes. Currently taking just metformin 500 mg twice daily. Able to taper off glipizide recently. Not checking blood sugars frequently but usually low 100s. He has lost 7 pounds since last visit in March.  He has history of atrial fibrillation and has follow-up with cardiology soon. Continues on amiodarone. Had TSH last fall  Past Medical History  Diagnosis Date  . Hypertension   . Hyperlipidemia   . Tinea versicolor   . OSA on CPAP     per study 08-03-2004  Severe OSA  . Allergic rhinitis   . History of colon polyps     tubular adenoma 2013  . Sigmoid diverticulosis   . Bilateral hydrocele   . Persistent atrial fibrillation [facility])     cardiologist --  paula ross--  post cardioversion 05-07-2014  . Seasonal allergies   . Type 2 diabetes mellitus ([place])   . Nocturia     flomax has improved symptoms  . Urgency of urination   . Prostate cancer [facility]) urologsit-  dr Alyson Ingles    Dx  2014--  stage T1c, Gleason 3+3=6, PSA 6.67--  Active surveillance  . History of gout     last episode early June 2017-- (feet)  . Wears glasses   . Dyspnea     occasional per pt  . Anticoagulant long-term use     eliquis  . 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 ([place])     pulmologist-  dr Halford Chessman  . COPD with emphysema [facility])     pulmologist-  dr Halford Chessman   Past Surgical History  Procedure Laterality Date  . Colonoscopy  last one 06-07-2011  . Prostate biopsy  05/15/12    Clinically both Lobes  . Tonsillectomy  as child  . Laminectomy and microdiscectomy lumbar spine  12-23-2003    Left L5 -- S1  . Laparoscopic bilateral inguinal hernia repair/  umbilical hernia repair with mesh/  aspiration left hydrocele  07-11-2012  . Transthoracic  echocardiogram  05/01/2014    mild LVH; EF 50-55% (cannot measure diastolic Fxn with Afib), Mod LA Dilation, mild MR  . Tee without cardioversion N/A 05/07/2014    Procedure: TRANSESOPHAGEAL ECHOCARDIOGRAM (TEE);  Surgeon: [name], MD;  Location: [facility] ENDOSCOPY;  Service: Cardiovascular;  Laterality: N/A;   mild atherosclerosis plaque of aorta,  mild AR, MR, and TR,  no cardiac source of emboli  . Cardioversion N/A 05/07/2014    Procedure: CARDIOVERSION;  Surgeon: [name], MD;  Location: [facility] ENDOSCOPY;  Service: Cardiovascular;  Laterality: N/A;  . Nm myoview ltd  05/18/2014    Low risk study. Normal perfusion: No ischemia or infarction. Mild LV dysfunction - 46% (does not correlate with echocardiographic EF of 50-55%)  . Cardioversion N/A 09/09/2014    Procedure: CARDIOVERSION;  Surgeon: Lelon Perla, MD;  Location: [facility] ENDOSCOPY;  Service: Cardiovascular;  Laterality: N/A;  successfully  . Hydrocele excision Bilateral 09/26/2015    Procedure: HYDROCELECTOMY ADULT;  Surgeon: [name], MD;  Location: [facility];  Service: Urology;  Laterality: Bilateral;    reports that he has been smoking Cigarettes.  He has a 55 pack-year smoking history. He has never used smokeless tobacco. He reports that he drinks about 12.0 oz of  alcohol per week. He reports that he does not use illicit drugs. family history includes Breast cancer in his mother; Ovarian cancer in his sister. There is no history of Colon cancer, Esophageal cancer, Rectal cancer, or Stomach cancer. No Known Allergies     Review of Systems  Constitutional: Negative for fatigue.  Eyes: Negative for visual disturbance.  Respiratory: Negative for cough, chest tightness and shortness of breath.   Cardiovascular: Negative for chest pain, palpitations and leg swelling.  Neurological: Negative for dizziness, syncope, weakness, light-headedness and headaches.       Objective:   Physical Exam  Constitutional: He  is oriented to person, place, and time. He appears well-developed and well-nourished.  HENT:  Right Ear: External ear normal.  Left Ear: External ear normal.  Mouth/Throat: Oropharynx is clear and moist.  Eyes: Pupils are equal, round, and reactive to light.  Neck: Neck supple. No thyromegaly present.  Cardiovascular: Normal rate and regular rhythm.   Pulmonary/Chest: Effort normal and breath sounds normal. No respiratory distress. He has no wheezes. He has no rales.  Musculoskeletal: He exhibits no edema.  Neurological: He is alert and oriented to person, place, and time.       Assessment:     Type 2 diabetes. Well controlled by home readings.A1c 6.4%    Plan:     Recheck Hemoccult and A1c. With continue with current regimen of metformin. Routine follow-up in 6 months  [name] MD [facility]

## 2015-10-24 NOTE — Progress Notes (Signed)
Pre visit review using our clinic review tool, if applicable. No additional management support is needed unless otherwise documented below in the visit note. 

## 2015-10-31 ENCOUNTER — Other Ambulatory Visit: Payer: Self-pay | Admitting: *Deleted

## 2015-10-31 MED ORDER — ATORVASTATIN CALCIUM 10 MG PO TABS: 10.0000 mg | ORAL_TABLET | Freq: Every evening | ORAL | 1 refills | Status: DC

## 2015-10-31 NOTE — Telephone Encounter (Signed)
[facility] faxed a refill request for Lisinopril 20mg  #90-take 1 tablet daily.  I do not see this on the patients med list?

## 2015-10-31 NOTE — Telephone Encounter (Signed)
I do see where he has taken in past but I also do not see on his med list. Confirm if he has been taking.  I do not think he should be on this.

## 2015-11-01 ENCOUNTER — Other Ambulatory Visit: Payer: Self-pay | Admitting: Cardiology

## 2015-11-01 NOTE — Telephone Encounter (Signed)
I called the pt and informed him of the message below. He stated he does not take Lisinopril as the cardiologist changed this and I informed the pt this will be noted in his chart.

## 2015-11-10 ENCOUNTER — Telehealth: Payer: Self-pay

## 2015-11-10 NOTE — Telephone Encounter (Signed)
Spoke with patient as I had receives a request from [facility] for Lisinopril. Patient states he is no longer taking this and that he has called [facility] and asked them to remove this medication from his med list.

## 2015-11-11 DIAGNOSIS — C61 Malignant neoplasm of prostate: Secondary | ICD-10-CM | POA: Diagnosis not present

## 2015-11-11 DIAGNOSIS — N433 Hydrocele, unspecified: Secondary | ICD-10-CM | POA: Diagnosis not present

## 2015-11-15 ENCOUNTER — Telehealth (HOSPITAL_COMMUNITY): Payer: Self-pay | Admitting: *Deleted

## 2015-11-15 NOTE — Telephone Encounter (Signed)
Pt spouse called to find out if it would be okay for pt to get into the hot tub on amiodarone.  Per Butch Penny, the pt is fine to get in the hot tub, should be mindful of if he starts to feel dizzy or lightheaded, he would need to get out and to be mindful of his alcohol intake on his trip.  Pt spouse understood.   Was also notified that this is not due to [facility] but rather if he is on BP meds or should get overheated

## 2015-11-27 ENCOUNTER — Other Ambulatory Visit: Payer: Self-pay | Admitting: Adult Health

## 2015-11-30 ENCOUNTER — Encounter (HOSPITAL_COMMUNITY): Payer: Self-pay | Admitting: Emergency Medicine

## 2015-11-30 ENCOUNTER — Emergency Department (HOSPITAL_COMMUNITY): Payer: Federal, State, Local not specified - PPO

## 2015-11-30 ENCOUNTER — Emergency Department (HOSPITAL_COMMUNITY)
Admission: EM | Admit: 2015-11-30 | Discharge: 2015-11-30 | Disposition: A | Discharge status: Home / Self Care | Payer: Federal, State, Local not specified - PPO | Attending: Emergency Medicine | Admitting: Emergency Medicine

## 2015-11-30 DIAGNOSIS — Y999 Unspecified external cause status: Secondary | ICD-10-CM | POA: Insufficient documentation

## 2015-11-30 DIAGNOSIS — Y9389 Activity, other specified: Secondary | ICD-10-CM | POA: Insufficient documentation

## 2015-11-30 DIAGNOSIS — F17210 Nicotine dependence, cigarettes, uncomplicated: Secondary | ICD-10-CM | POA: Diagnosis not present

## 2015-11-30 DIAGNOSIS — J449 Chronic obstructive pulmonary disease, unspecified: Secondary | ICD-10-CM | POA: Insufficient documentation

## 2015-11-30 DIAGNOSIS — M542 Cervicalgia: Secondary | ICD-10-CM | POA: Insufficient documentation

## 2015-11-30 DIAGNOSIS — E119 Type 2 diabetes mellitus without complications: Secondary | ICD-10-CM | POA: Diagnosis not present

## 2015-11-30 DIAGNOSIS — R110 Nausea: Secondary | ICD-10-CM | POA: Diagnosis not present

## 2015-11-30 DIAGNOSIS — Z79899 Other long term (current) drug therapy: Secondary | ICD-10-CM | POA: Insufficient documentation

## 2015-11-30 DIAGNOSIS — I10 Essential (primary) hypertension: Secondary | ICD-10-CM | POA: Insufficient documentation

## 2015-11-30 DIAGNOSIS — Z7984 Long term (current) use of oral hypoglycemic drugs: Secondary | ICD-10-CM | POA: Diagnosis not present

## 2015-11-30 DIAGNOSIS — Z8546 Personal history of malignant neoplasm of prostate: Secondary | ICD-10-CM | POA: Diagnosis not present

## 2015-11-30 DIAGNOSIS — Z041 Encounter for examination and observation following transport accident: Secondary | ICD-10-CM | POA: Diagnosis not present

## 2015-11-30 DIAGNOSIS — S0990XA Unspecified injury of head, initial encounter: Secondary | ICD-10-CM | POA: Diagnosis not present

## 2015-11-30 DIAGNOSIS — R42 Dizziness and giddiness: Secondary | ICD-10-CM | POA: Insufficient documentation

## 2015-11-30 DIAGNOSIS — Y92488 Other paved roadways as the place of occurrence of the external cause: Secondary | ICD-10-CM | POA: Insufficient documentation

## 2015-11-30 LAB — BASIC METABOLIC PANEL
Anion gap: 10 (ref 5–15)
BUN: 13 mg/dL (ref 6–20)
CALCIUM: 8.6 mg/dL — AB (ref 8.9–10.3)
CO2: 25 mmol/L (ref 22–32)
CREATININE: 1.22 mg/dL (ref 0.61–1.24)
Chloride: 103 mmol/L (ref 101–111)
GFR calc non Af Amer: 58 mL/min — ABNORMAL LOW (ref >60)
Glucose, Bld: 147 mg/dL — ABNORMAL HIGH (ref 65–99)
Potassium: 3.4 mmol/L — ABNORMAL LOW (ref 3.5–5.1)
SODIUM: 138 mmol/L (ref 135–145)

## 2015-11-30 MED ORDER — LIDOCAINE 5 % EX PTCH: 1.0000 | MEDICATED_PATCH | CUTANEOUS | 0 refills | Status: DC

## 2015-11-30 MED ORDER — METHOCARBAMOL 500 MG PO TABS: 500.0000 mg | ORAL_TABLET | Freq: Two times a day (BID) | ORAL | 0 refills | Status: DC

## 2015-11-30 MED ORDER — ACETAMINOPHEN 500 MG PO TABS
500.0000 mg | ORAL_TABLET | Freq: Once | ORAL | Status: AC
  Administered 2015-11-30: 500 mg via ORAL
  Filled 2015-11-30: qty 1

## 2015-11-30 MED ORDER — IOPAMIDOL (ISOVUE-370) INJECTION 76%
100.0000 mL | Freq: Once | INTRAVENOUS | Status: AC | PRN
  Administered 2015-11-30: 100 mL via INTRAVENOUS
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
## 2015-11-30 NOTE — ED Notes (Signed)
Patient transported to CT 
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
## 2015-12-07 ENCOUNTER — Encounter: Payer: Self-pay | Admitting: Cardiology

## 2015-12-07 ENCOUNTER — Ambulatory Visit (INDEPENDENT_AMBULATORY_CARE_PROVIDER_SITE_OTHER): Payer: Federal, State, Local not specified - PPO | Admitting: Cardiology

## 2015-12-07 VITALS — BP 141/80 | HR 55 | Ht 71.0 in | Wt 233.4 lb

## 2015-12-07 DIAGNOSIS — R600 Localized edema: Secondary | ICD-10-CM

## 2015-12-07 DIAGNOSIS — E8881 Metabolic syndrome: Secondary | ICD-10-CM | POA: Diagnosis not present

## 2015-12-07 DIAGNOSIS — I481 Persistent atrial fibrillation: Secondary | ICD-10-CM

## 2015-12-07 DIAGNOSIS — E785 Hyperlipidemia, unspecified: Secondary | ICD-10-CM

## 2015-12-07 DIAGNOSIS — Z79899 Other long term (current) drug therapy: Secondary | ICD-10-CM

## 2015-12-07 DIAGNOSIS — R0600 Dyspnea, unspecified: Secondary | ICD-10-CM

## 2015-12-07 DIAGNOSIS — I10 Essential (primary) hypertension: Secondary | ICD-10-CM | POA: Diagnosis not present

## 2015-12-07 DIAGNOSIS — G4733 Obstructive sleep apnea (adult) (pediatric): Secondary | ICD-10-CM

## 2015-12-07 DIAGNOSIS — I4819 Other persistent atrial fibrillation: Secondary | ICD-10-CM

## 2015-12-07 DIAGNOSIS — Z720 Tobacco use: Secondary | ICD-10-CM

## 2015-12-07 DIAGNOSIS — R0609 Other forms of dyspnea: Secondary | ICD-10-CM

## 2015-12-07 LAB — C-REACTIVE PROTEIN: CRP: <0.5 mg/dL (ref <0.60)

## 2015-12-07 MED ORDER — LISINOPRIL 5 MG PO TABS: 5.0000 mg | ORAL_TABLET | Freq: Every day | ORAL | 3 refills | Status: DC

## 2015-12-07 NOTE — Patient Instructions (Signed)
Change in medication Take metoprolol  A 1/2 tablet for 1 week twice a day then decrease to 1/2 tablet daily the stop. Start lisinopril 5 mg once daily when you start 1/2 tablet daily of metoprolol.   Labs- sed rate, C- REACTIVE PROTEIN-   WEAR SUPPORT STOCKING - IF TRAVELING OR STANDING FOR PERIODS AT A TIME  Your physician wants you to follow-up in:6 MONTHS WITH DR HARDING. You will receive a reminder letter in the mail two months in advance. If you don't receive a letter, please call our office to schedule the follow-up appointment.   Pulmonary Function Tests Pulmonary function tests (PFTs) measure how well your lungs are working. The tests can help to identify the causes of lung problems. They can also help your health care provider select the best treatment for you. Your health care provider may order pulmonary function for any of the following reasons:  When an illness involving the lungs is suspected.  To follow changes in your lung function over time if you are known to have a chronic lung disease.  For industrial plant workers to examine the effects of being exposed to chemicals over a long period of time.  To assess lung function prior to surgery or other procedures.  For people who are smokers. Your measured lung function will be compared to the expected lung function of someone with healthy lungs who is similar to you in age, gender, size, and other factors. This is used to determine your "percent predicted" lung function, which is how your health care provider knows if your lung function is normal or abnormal. If you have had prior pulmonary function testing performed, your health care provider will also compare your current results with past tests to see if your lung function is better, worse, or staying the same. This can sometimes be useful to see if treatments are working.  LET [facility] CARE PROVIDER KNOW ABOUT:  Any allergies you have.  All medicines you are taking,  including inhaler or nebulizer medicines, vitamins, herbs, eye drops, creams, and over-the-counter medicines.  Any blood disorders you have.  Previous surgeries you have had, especially recent eye surgery, abdominal surgery, or chest surgery. These can make performing pulmonary function tests difficult or unsafe.  Medical conditions you have.  Chest pain or heart problems.  Tuberculosis or respiratory infections, such as pneumonia, a cold, or the flu. If you think you will have difficulty performing any of the breathing maneuvers, ask your health care provider if you should reschedule the test. RISKS AND COMPLICATIONS: Generally, pulmonary function testing is a safe procedure. However, as with any procedure, complications can occur. Possible complications include:  Lightheadedness due to overbreathing (hyperventilation).  An asthmatic attack from deep breathing. BEFORE THE PROCEDURE  Take medicine as directed by your health care provider. If you take inhaler or nebulizer medicines, ask your health care provider which medicines you should take on the day of your test. Some inhaler medicines may interfere with pulmonary function tests, such as bronchodilator testing, if taken shortly before the test.  Avoid eating a large meal before your test.  Do not smoke before your test.  Wear comfortable clothing which will not interfere with breathing. PROCEDURE  You will be given a soft nose clip to wear during the procedure. This is done so that all of your breaths will go through your mouth instead of your nose.  You will be given a germ-free (sterile) mouthpiece. It will be attached to a spirometer. The  spirometer is the machine that measures your breathing.  You will be instructed to perform various breathing maneuvers. The maneuvers will be done by breathing in (inhaling) and breathing out (exhaling). Depending on what measurements are ordered, you may be asked to repeat the maneuvers  several times before the test is completed.  It is important to follow the instructions exactly to obtain accurate results. Make sure to blow as hard and as fast as you can when you are instructed to do so.  You may be given a bronchodilator after testing has been performed. A bronchodilator is a medicine which makes the small air passages in your lungs larger. These medicines usually make it easier to breathe. The tests are then repeated several minutes later after the bronchodilator has taken effect.  You will be monitored carefully during the procedure for faintness, dizziness, difficulty breathing, or any other problems. AFTER THE PROCEDURE   You may resume your usual diet, medicines, and activities as directed by your health care provider.  Your health care provider will go over your test results with you and determine what treatments may be helpful.   This information is not intended to replace advice given to you by your health care provider. Make sure you discuss any questions you have with your health care provider.   Document Released: 11/17/2003 Document Revised: 01/14/2013 Document Reviewed: 10/23/2012 Elsevier Interactive Patient Education [employer].

## 2015-12-07 NOTE — Progress Notes (Signed)
PCP: Ronald Post, MD  Clinic Note: Chief Complaint  Patient presents with  . Follow-up    6 Months; sob when exerting selff. edema occasionally in feet, ankles and legs.   . Atrial Fibrillation  . Edema    HPI: Ronald [name] is a 70 y.o. male with a PMH below who presents today for 6 month follow-up with a history of atrial fibrillation.Marland Kitchen He is a pleasant gentleman with a prior history of hypertension, hyperlipidemia and mild leg edema who was initially diagnosed with A. fib back in January 2016 during a preop evaluation for bilateral hydrocelectomy. He had A. fib with RVR. He was initially started on calcium channel blocker the beta blocker for rate control but he suddenly reverted back to A. fib. Finally based on symptoms we sent him to the A. fib clinic and we eventually used amiodarone for rhythm control. He did not tolerate the higher doses of beta blocker and calcium channel blocker while still being in A. fib. He is now on amiodarone plus diltiazem and low-dose Lopressor. He chemically cardioverted on amiodarone, and was still in sinus rhythm without saw him in February 2017. I referred him to the A. fib clinic in September 2016 --  beta blocker was reduced to 25 twice a day-> Blood pressure and energy level improved with reduced beta blocker dose.  He was seen by Dr. Halford [name] from pulmonary medicine, and has quit smoking as of February 2017.Ronald [name] was last seen on February 2017. He was feeling quite well. Feeling much better being in sinus rhythm and breathing better off of cigarettes.  Instructions:  - YOU MAY HOLD ATORVASTATIN ( LIPITOR) FOR 1 MONTH -TO SEE IF YOUR LEG PAIN STOPS- RESTART, ATORVASTATIN IF PAIN RETURNS  -- CONTACT OFFICE.  OKAY TO HOLD ELIQUIS---  IF YOU HAVE THE HYDROCELL FOR 2 DAYS; IF BIOPSY- 24 HOURS HOLD MEDICATION.  Recent Hospitalizations: [facility] Sgx in June  Studies Reviewed: None  He actually quit smoking the end of January and is  fully quit by February. He is started notice weight gain   Interval History: Ronald [name] Returns today feeling quite well. He really has not had any issues at all with A. fib or dyspnea since I last saw him. No bleeding issues with ELIQUIS. He's had a little bit of foot swelling off and on and has had to maybe take additional dose of Lasix once twice a week but more the last week or so. He denies any PND or orthopnea. No rapid irregular heartbeats.  Over the last month or 2 he's also been noticing that he has been having some issues with congestion and coughing, his wife got a machine to clean his CPAP equipment, and since doing that he's been doing home better. Eating better, and not having as much the respiratory issues. He sees be tolerating the metformin that is taken for diabetes. Denying any more cramping or aching symptoms or myalgias.  He notes his energy level is definitely improved and staying stable. Breathing is improving since he is stop smoking.  From a cardiac standpoint he really isn't not had any significant rapid irregular heartbeat sensations. Nothing to suggest recurrence of A. fib. He was very symptomatic while in A. fib, and has not had those symptoms of fatigue and dyspnea. He denies any significant reduction in energy after having back his beta blocker down. He is able to do some exercising and is a his heart rate up some. He denies  any heart failure symptoms of PND, orthopnea to go along the edema. These try to be active, but he says his legs, aching and feels somewhat weak. His hydroceles also start to get uncomfortable.   No chest pain or shortness of breath with rest or exertion.  Minimal  palpitations,  but no lightheadedness, dizziness, weakness or syncope/near syncope. No TIA/amaurosis fugax symptoms. No melena, hematochezia, hematuria, or epstaxis. No claudication.  ROS: A comprehensive was performed. Review of Systems  Constitutional: Positive for malaise/fatigue  (Improved with low-dose beta blocker).  HENT: Positive for congestion. Negative for nosebleeds.   Respiratory: Positive for cough (Actually better now that he is been off his prednisone and change in his cleaning equipment for CPAP. Currently taking Bactrim.). Negative for sputum production, shortness of breath and wheezing.   Cardiovascular: Positive for leg swelling.       Per history of present illness  Gastrointestinal: Negative for blood in stool and melena.  Genitourinary: Negative for hematuria.  Musculoskeletal: Positive for myalgias (Notably improved in legs.).  Neurological: Negative for dizziness and headaches.  Endo/Heme/Allergies: Does not bruise/bleed easily.  Psychiatric/Behavioral: Negative for depression and memory loss. The patient is not nervous/anxious.   All other systems reviewed and are negative.   Past Medical History:  Diagnosis Date  . Allergic rhinitis   . Anticoagulant long-term use    eliquis  . Bilateral hydrocele   . COPD with emphysema [name])    pulmologist-  dr Ronald [name]  . Dyspnea    occasional per pt  . History of colon polyps    tubular adenoma 2013  . History of gout    last episode early June 2017-- (feet)  . Hyperlipidemia   . Hypertension   . Nocturia    flomax has improved symptoms  . OSA on CPAP    per study 08-03-2004  Severe OSA  . Persistent atrial fibrillation [facility])    cardiologist --  Ronald [name]--  [name] cardioversion 05-07-2014  . Prostate cancer [facility]) urologsit-  dr Ronald [name]   Dx  2014--  stage T1c, Gleason 3+3=6, PSA 6.67--  Active surveillance  . 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 Seasonal allergies   . Sigmoid diverticulosis   . Tinea versicolor   . Type 2 diabetes mellitus ([place])   . Urgency of urination   . Wears glasses     Past Surgical History:  Procedure Laterality Date  . CARDIOVERSION N/A 05/07/2014   Procedure: CARDIOVERSION;  Surgeon: Ronald Records, MD;   Location: [facility] ENDOSCOPY;  Service: Cardiovascular;  Laterality: N/A;  . CARDIOVERSION N/A 09/09/2014   Procedure: CARDIOVERSION;  Surgeon: Lelon Perla, MD;  Location: [facility] ENDOSCOPY;  Service: Cardiovascular;  Laterality: N/A;  successfully  . COLONOSCOPY  last one 06-07-2011  . HYDROCELE EXCISION Bilateral 09/26/2015   Procedure: HYDROCELECTOMY ADULT;  Surgeon: [name], MD;  [facility];  Service: Urology;  Laterality: Bilateral;  . LAMINECTOMY AND MICRODISCECTOMY LUMBAR SPINE  12-23-2003   Left L5 -- S1  . LAPAROSCOPIC BILATERAL INGUINAL HERNIA REPAIR/  UMBILICAL HERNIA REPAIR WITH MESH/  ASPIRATION LEFT HYDROCELE  07-11-2012  . NM MYOVIEW LTD  05/18/2014   Low risk study. Normal perfusion: No ischemia or infarction. Mild LV dysfunction - 46% (does not correlate with echocardiographic EF of 50-55%)  . PROSTATE BIOPSY  05/15/12   Clinically both Lobes  . TEE WITHOUT CARDIOVERSION N/A 05/07/2014   Procedure: TRANSESOPHAGEAL ECHOCARDIOGRAM (TEE);  Surgeon: Ronald Records, MD;  Location[facility] ENDOSCOPY;  Service: Cardiovascular;  Laterality: N/A;   mild atherosclerosis plaque of aorta,  mild AR, MR, and TR,  no cardiac source of emboli  . TONSILLECTOMY  as child  . TRANSTHORACIC ECHOCARDIOGRAM  05/01/2014   mild LVH; EF 50-55% (cannot measure diastolic Fxn with Afib), Mod LA Dilation, mild MR    Prior to Admission medications   Medication Sig Start Date End Date Taking? Authorizing Provider  acetaminophen (TYLENOL) 500 MG tablet Take 500 mg by mouth every 6 (six) hours as needed for mild pain or moderate pain.   Yes Historical Provider, MD  amiodarone (PACERONE) 200 MG tablet Take 1 tablet (200 mg total) by mouth daily. Patient taking differently: Take 200 mg by mouth every morning.  09/15/15  Yes Sherran Needs, NP  atorvastatin (LIPITOR) 10 MG tablet Take 1 tablet (10 mg total) by mouth every evening. 10/31/15  Yes Ronald Post, MD  diltiazem (CARDIZEM CD) 360  MG 24 hr capsule TAKE 1 CAPSULE DAILY Patient taking differently: TAKE 360 MG BY MOUTH EVERY EVENING 09/02/15  Yes Ronald Post, MD  fluticasone (FLONASE) 50 MCG/ACT nasal spray Place 2 sprays into both nostrils daily as needed for allergies or rhinitis. 05/08/14  Yes Modena Jansky, MD  furosemide (LASIX) 40 MG tablet Take 1-2 tablets daily as needed. Patient taking differently: Take 40-80 mg by mouth daily as needed.  06/02/15  Yes Ronald Post, MD  glucose blood (ACCU-CHEK AVIVA PLUS) test strip Check blood sugars once per day. DX: E11.9 10/24/15  Yes Ronald Post, MD  Lancets (ACCU-CHEK SOFT TOUCH) lancets Check blood sugars once per day. DX: E11.9 10/24/15  Yes Ronald Post, MD  metFORMIN (GLUCOPHAGE) 500 MG tablet TAKE 1 TABLET (500 MG TOTAL) BY MOUTH 2 (TWO) TIMES DAILY WITH A MEAL. 11/28/15  Yes Ronald Post, MD  methocarbamol (ROBAXIN) 500 MG tablet Take 1 tablet (500 mg total) by mouth 2 (two) times daily. 11/30/15  Yes Shawn C Joy, PA-C  metoprolol tartrate (LOPRESSOR) 25 MG tablet TAKE 1 TABLET (25 MG TOTAL) BY MOUTH 2 (TWO) TIMES DAILY. 11/01/15  Yes Leonie Man, MD  tamsulosin (FLOMAX) 0.4 MG CAPS capsule Take 0.4 mg by mouth daily after supper.  02/14/15  Yes Historical Provider, MD  triamcinolone cream (KENALOG) 0.1 % Apply 1 application topically 2 (two) times daily as needed (for rash). 07/04/15  Yes Ronald Post, MD   Also on Eliquis 61m daily  No Known Allergies  Social History   Social History  . Marital status: Married    Spouse name: N/A  . Number of children: N/A  . Years of education: N/A   Occupational History  . Sales [employer]   Social History Main Topics  . Smoking status: Current Every Day Smoker    Packs/day: 1.00    Years: 55.00    Types: Cigarettes  . Smokeless tobacco: Never Used     Comment: trying to quit  mid Jan 2017 - as of 09-20-2015  down to , per pt 2-3 cigarettes per day  . Alcohol use 12.0 oz/week    14  Shots of liquor, 6 Standard drinks or equivalent per week     Comment: 2 drinks per day  . Drug use: No  . Sexual activity: Not Asked   Other Topics Concern  . None   Social History Narrative   Married 25 years   2 children but  not with this wife   Family History  Problem Relation Age of Onset  . Breast cancer Mother   . Stroke      Unknown   . Ovarian cancer Sister   . Colon cancer Neg Hx   . Esophageal cancer Neg Hx   . Rectal cancer Neg Hx   . Stomach cancer Neg Hx     Wt Readings from Last 3 Encounters:  12/07/15 233 lb 6.4 oz (105.9 kg)  10/24/15 224 lb (101.6 kg)  09/26/15 225 lb (102.1 kg)  Weight gain since stopping smoking  PHYSICAL EXAM BP (!) 141/80   Pulse (!) 55   Ht _0  (1.803 m)   Wt 233 lb 6.4 oz (105.9 kg)   BMI 32.55 kg/m  General appearance: alert, cooperative, appears stated age, no distress and mildy obese Neck: no adenopathy, no carotid bruit and no JVD Lungs: clear to auscultation bilaterally, normal percussion bilaterally and non-labored Heart: Bradycardic rate and rhythm, S1, S2 normal, no murmur, click, rub or gallop; Abdomen: soft, non-tender; bowel sounds normal; no masses, no organomegaly; mild truncal obesity Extremities: extremities normal, atraumatic, no cyanosis, and edema -- trace to 1+ left greater than right ankle Pulses: 2+ and symmetric; Skin: normal and mobility and turgor normal ; mild spider veins on bilateral ankles. Neurologic: Mental status: Alert, oriented, thought content appropriate Cranial nerves: normal (II-XII grossly intact)    Adult ECG Report  Rate: 55;  Rhythm: sinus bradycardia; Nonspecific ST and T-wave flattening   Narrative Interpretation: Axis is changed some. Now somewhat rightward. Otherwise stable  Other studies Reviewed: Additional studies/ [name] that were reviewed today include:  Recent Labs:  PCP follows lipid labs    ASSESSMENT / PLAN: Problem List Items Addressed This Visit    Tobacco  abuse (Chronic)    Smoking cessation instruction/counseling given:  commended patient for quitting and reviewed strategies for preventing relapses      Relevant Orders   EKG 12-Lead (Completed)   Sedimentation rate (Completed)   C-reactive protein (Completed)   Pulmonary Function Test   On amiodarone therapy - Primary (Chronic)    PFTs ordered now. Will also check ESR and CRP to ensure this no evidence of inflammation. If there is a change in his PFTs in the absence of inflammation, it was likely not be related to amiodarone. At this point I think he probably is better off staying out of A. fib, we'll therefore continue amiodarone as well as diltiazem.      Relevant Orders   EKG 12-Lead (Completed)   Sedimentation rate (Completed)   C-reactive protein (Completed)   Pulmonary Function Test   Obstructive sleep apnea (Chronic)    Followed by Dr. Halford [name] from pulmonary medicine. On CPAP. Using cleaning system.      Metabolic syndrome (Chronic)   Relevant Orders   EKG 12-Lead (Completed)   Sedimentation rate (Completed)   C-reactive protein (Completed)   Pulmonary Function Test   Hyperlipidemia (Chronic)    On statin. Monitored by PCP.      Relevant Medications   apixaban (ELIQUIS) 5 MG TABS tablet   lisinopril (PRINIVIL,ZESTRIL) 5 MG tablet   Other Relevant Orders   EKG 12-Lead (Completed)   Sedimentation rate (Completed)   C-reactive protein (Completed)   Pulmonary Function Test   Exertional dyspnea (Chronic)    Deathly improved. I think now that he is changes CPAP equipment is also improved somewhat. Unlikely to be a true cardiac etiology at this point.  Relevant Orders   EKG 12-Lead (Completed)   Sedimentation rate (Completed)   C-reactive protein (Completed)   Pulmonary Function Test   Essential hypertension (Chronic)    Borderline pressures today. With stopping his beta blocker, will need to add additional medication for anti-hypertensive therapy. We'll start  lisinopril 5 mg daily.  There is added benefit of ACE inhibitor's/ARB's with atrial fibrillation.      Relevant Medications   apixaban (ELIQUIS) 5 MG TABS tablet   lisinopril (PRINIVIL,ZESTRIL) 5 MG tablet   Other Relevant Orders   EKG 12-Lead (Completed)   Sedimentation rate (Completed)   C-reactive protein (Completed)   Pulmonary Function Test   Edema of both legs    Probably more related to venous insufficiency with spider veins and skin discoloration. Again we talked about the importance of using support hose. Continue current dose of Lasix with when necessary additional dose.      Atrial fibrillation, persistent ([place]):  CHA2DS2-VASc Score - On Eliquis (Chronic)    Maintaining sinus rhythm with amiodarone. Baseline bradycardia but doing relatively well. Stable on Eliquis. (Not on current med list, but he is taking). Plan: Wean off of blocker completely. Use for when necessary rapid heartbeats. Will replace with lisinopril for blood pressure control. For now will continue amiodarone. Will need routine annual check of PFTs. We discussed need for annual eye exams as well as LFTs and TFTs.       Relevant Medications   apixaban (ELIQUIS) 5 MG TABS tablet   lisinopril (PRINIVIL,ZESTRIL) 5 MG tablet   Other Relevant Orders   EKG 12-Lead (Completed)   Sedimentation rate (Completed)   C-reactive protein (Completed)   Pulmonary Function Test    Other Visit Diagnoses   None.     Current medicines are reviewed at length with the patient today. (+/- concerns) ? Atorvastatin with leg aching; ? Holding Eliquis for Biopsy of leg muscle & Hydrocele Sgx The following changes have been made:  Change in medication Take metoprolol  A 1/2 tablet for 1 week twice a day then decrease to 1/2 tablet daily the stop. Start lisinopril 5 mg once daily when you start 1/2 tablet daily of metoprolol.   Labs- sed rate, C- REACTIVE PROTEIN-   WEAR SUPPORT STOCKING - IF TRAVELING OR STANDING FOR  PERIODS AT A TIME  Your physician wants you to follow-up in:6 MONTHS WITH DR HARDING.  Studies Ordered:   Orders Placed This Encounter  Procedures  . Sedimentation rate  . C-reactive protein  . EKG 12-Lead  . Pulmonary Function Test      Glenetta Hew, M.D., M.S. Interventional Cardiologist   Pager # [phone] Phone # [phone] [address] [place],  [address]

## 2015-12-08 ENCOUNTER — Telehealth: Payer: Self-pay | Admitting: Cardiology

## 2015-12-08 LAB — SEDIMENTATION RATE: SED RATE: 1 mm/h (ref 0–20)

## 2015-12-08 NOTE — Telephone Encounter (Signed)
Called patient and LVM for his pulmonary appointment at [facility] on 12-16-15 at 11 a.m.  NO SMOKING, NO COFFEE AND NO BREATHING TREATMENTS 4 HOURS BEFORE.   Left my phone number if any questions.

## 2015-12-09 ENCOUNTER — Encounter: Payer: Self-pay | Admitting: Cardiology

## 2015-12-09 NOTE — Assessment & Plan Note (Signed)
Deathly improved. I think now that he is changes CPAP equipment is also improved somewhat. Unlikely to be a true cardiac etiology at this point.

## 2015-12-09 NOTE — Assessment & Plan Note (Signed)
Followed by Dr. Halford Chessman from pulmonary medicine. On CPAP. Using cleaning system.

## 2015-12-09 NOTE — Assessment & Plan Note (Signed)
PFTs ordered now. Will also check ESR and CRP to ensure this no evidence of inflammation. If there is a change in his PFTs in the absence of inflammation, it was likely not be related to amiodarone. At this point I think he probably is better off staying out of A. fib, we'll therefore continue amiodarone as well as diltiazem.

## 2015-12-09 NOTE — Assessment & Plan Note (Signed)
Borderline pressures today. With stopping his beta blocker, will need to add additional medication for anti-hypertensive therapy. We'll start lisinopril 5 mg daily.  There is added benefit of ACE inhibitor's/ARB's with atrial fibrillation.

## 2015-12-09 NOTE — Assessment & Plan Note (Signed)
On statin. Monitored by PCP. 

## 2015-12-09 NOTE — Assessment & Plan Note (Addendum)
Maintaining sinus rhythm with amiodarone. Baseline bradycardia but doing relatively well. Stable on Eliquis. (Not on current med list, but he is taking). Plan: Wean off of blocker completely. Use for when necessary rapid heartbeats. Will replace with lisinopril for blood pressure control. For now will continue amiodarone. Will need routine annual check of PFTs. We discussed need for annual eye exams as well as LFTs and TFTs.

## 2015-12-09 NOTE — Assessment & Plan Note (Signed)
Probably more related to venous insufficiency with spider veins and skin discoloration. Again we talked about the importance of using support hose. Continue current dose of Lasix with when necessary additional dose.

## 2015-12-09 NOTE — Assessment & Plan Note (Signed)
Smoking cessation instruction/counseling given:  commended patient for quitting and reviewed strategies for preventing relapses 

## 2015-12-13 ENCOUNTER — Telehealth: Payer: Self-pay | Admitting: *Deleted

## 2015-12-13 NOTE — Telephone Encounter (Signed)
LEFT MESSAGE TO CALL BACK ON HOME PHONE - REGARDING LAB RESULTS

## 2015-12-13 NOTE — Telephone Encounter (Signed)
-----   Message from Leonie Man, MD sent at 12/09/2015  2:15 PM EDT ----- Normal CRP and sedimentation rate. This would suggest no evidence of amiodarone toxicity

## 2015-12-14 NOTE — Telephone Encounter (Signed)
Spoke to patient. Result given . Verbalized understanding  

## 2015-12-16 ENCOUNTER — Encounter (HOSPITAL_COMMUNITY)
Admission: RE | Admit: 2015-12-16 | Discharge: 2015-12-16 | Disposition: A | Discharge status: Home / Self Care | Payer: Federal, State, Local not specified - PPO | Source: Ambulatory Visit | Attending: Cardiology | Admitting: Cardiology

## 2015-12-16 DIAGNOSIS — R0609 Other forms of dyspnea: Secondary | ICD-10-CM | POA: Diagnosis not present

## 2015-12-16 DIAGNOSIS — E785 Hyperlipidemia, unspecified: Secondary | ICD-10-CM | POA: Diagnosis not present

## 2015-12-16 DIAGNOSIS — I10 Essential (primary) hypertension: Secondary | ICD-10-CM | POA: Insufficient documentation

## 2015-12-16 DIAGNOSIS — Z720 Tobacco use: Secondary | ICD-10-CM

## 2015-12-16 DIAGNOSIS — I481 Persistent atrial fibrillation: Secondary | ICD-10-CM | POA: Insufficient documentation

## 2015-12-16 DIAGNOSIS — R0600 Dyspnea, unspecified: Secondary | ICD-10-CM

## 2015-12-16 DIAGNOSIS — Z79899 Other long term (current) drug therapy: Secondary | ICD-10-CM | POA: Insufficient documentation

## 2015-12-16 DIAGNOSIS — I4819 Other persistent atrial fibrillation: Secondary | ICD-10-CM

## 2015-12-16 DIAGNOSIS — E8881 Metabolic syndrome: Secondary | ICD-10-CM | POA: Diagnosis not present

## 2015-12-16 LAB — PULMONARY FUNCTION TEST
DL/VA % PRED: 63 %
DL/VA: 2.91 ml/min/mmHg/L
DLCO UNC: 17.57 ml/min/mmHg
DLCO unc % pred: 54 %
FEF 25-75 POST: 2.87 L/s
FEF 25-75 Pre: 2.99 L/sec
FEF2575-%Change-Post: -4 %
FEF2575-%Pred-Post: 117 %
FEF2575-%Pred-Pre: 122 %
FEV1-%CHANGE-POST: 0 %
FEV1-%PRED-PRE: 96 %
FEV1-%Pred-Post: 96 %
FEV1-POST: 3.11 L
FEV1-PRE: 3.13 L
FEV1FVC-%Change-Post: 1 %
FEV1FVC-%PRED-PRE: 108 %
FEV6-%Change-Post: -1 %
FEV6-%PRED-POST: 93 %
FEV6-%PRED-PRE: 94 %
FEV6-POST: 3.88 L
FEV6-PRE: 3.93 L
FEV6FVC-%CHANGE-POST: 0 %
FEV6FVC-%PRED-POST: 106 %
FEV6FVC-%PRED-PRE: 105 %
FVC-%Change-Post: -2 %
FVC-%PRED-POST: 88 %
FVC-%PRED-PRE: 90 %
FVC-POST: 3.89 L
FVC-PRE: 3.97 L
PRE FEV6/FVC RATIO: 99 %
Post FEV1/FVC ratio: 80 %
Post FEV6/FVC ratio: 100 %
Pre FEV1/FVC ratio: 79 %
RV % PRED: 92 %
RV: 2.27 L
TLC % PRED: 93 %
TLC: 6.59 L

## 2015-12-16 MED ORDER — ALBUTEROL SULFATE (2.5 MG/3ML) 0.083% IN NEBU
2.5000 mg | INHALATION_SOLUTION | Freq: Once | RESPIRATORY_TRACT | Status: AC
  Administered 2015-12-16: 2.5 mg via RESPIRATORY_TRACT

## 2015-12-19 DIAGNOSIS — H2513 Age-related nuclear cataract, bilateral: Secondary | ICD-10-CM | POA: Diagnosis not present

## 2015-12-19 DIAGNOSIS — E119 Type 2 diabetes mellitus without complications: Secondary | ICD-10-CM | POA: Diagnosis not present

## 2015-12-19 LAB — HM DIABETES EYE EXAM

## 2015-12-19 NOTE — Progress Notes (Signed)
PFT results: Minimal Obstructive Airways Disease Insignificant response to bronchodilator Moderately severe Diffusion Defect -- no significant change from last study. With normal CRP and sedimentation rate, I doubt that this is related to amiodarone toxicity -- no sign of inflammatory response.  Continue Amiodarone.  Glenetta Hew

## 2015-12-22 ENCOUNTER — Encounter: Payer: Self-pay | Admitting: Pulmonary Disease

## 2015-12-22 ENCOUNTER — Ambulatory Visit (INDEPENDENT_AMBULATORY_CARE_PROVIDER_SITE_OTHER): Payer: Federal, State, Local not specified - PPO | Admitting: Pulmonary Disease

## 2015-12-22 VITALS — BP 128/80 | HR 64 | Ht 71.0 in | Wt 238.2 lb

## 2015-12-22 DIAGNOSIS — R05 Cough: Secondary | ICD-10-CM

## 2015-12-22 DIAGNOSIS — J449 Chronic obstructive pulmonary disease, unspecified: Secondary | ICD-10-CM

## 2015-12-22 DIAGNOSIS — F17210 Nicotine dependence, cigarettes, uncomplicated: Secondary | ICD-10-CM | POA: Diagnosis not present

## 2015-12-22 DIAGNOSIS — J438 Other emphysema: Secondary | ICD-10-CM

## 2015-12-22 DIAGNOSIS — J84115 Respiratory bronchiolitis interstitial lung disease: Secondary | ICD-10-CM

## 2015-12-22 DIAGNOSIS — G4733 Obstructive sleep apnea (adult) (pediatric): Secondary | ICD-10-CM | POA: Diagnosis not present

## 2015-12-22 DIAGNOSIS — Z23 Encounter for immunization: Secondary | ICD-10-CM

## 2015-12-22 DIAGNOSIS — R058 Other specified cough: Secondary | ICD-10-CM

## 2015-12-22 MED ORDER — ALBUTEROL SULFATE HFA 108 (90 BASE) MCG/ACT IN AERS: 2.0000 | INHALATION_SPRAY | Freq: Four times a day (QID) | RESPIRATORY_TRACT | 5 refills | Status: DC | PRN

## 2015-12-22 MED ORDER — AZELASTINE HCL 0.1 % NA SOLN: 1.0000 | Freq: Two times a day (BID) | NASAL | 12 refills | Status: DC

## 2015-12-22 NOTE — Patient Instructions (Addendum)
Ventolin HFA two puffs every 6 hours as needed for cough, wheezing, or chest congestion  Astelin one spray each nostril twice per day  Flonase one spray each nostril daily  Saline nasal spray twice per day  Flu shot today  Follow up in 6 months

## 2015-12-22 NOTE — Progress Notes (Signed)
Current Outpatient Prescriptions on File Prior to Visit  Medication Sig  . acetaminophen (TYLENOL) 500 MG tablet Take 500 mg by mouth every 6 (six) hours as needed for mild pain or moderate pain.  Marland Kitchen amiodarone (PACERONE) 200 MG tablet Take 1 tablet (200 mg total) by mouth daily. (Patient taking differently: Take 200 mg by mouth every morning. )  . apixaban (ELIQUIS) 5 MG TABS tablet Take 5 mg by mouth 2 (two) times daily.  Marland Kitchen atorvastatin (LIPITOR) 10 MG tablet Take 1 tablet (10 mg total) by mouth every evening.  . diltiazem (CARDIZEM CD) 360 MG 24 hr capsule TAKE 1 CAPSULE DAILY (Patient taking differently: TAKE 360 MG BY MOUTH EVERY EVENING)  . fluticasone (FLONASE) 50 MCG/ACT nasal spray Place 2 sprays into both nostrils daily as needed for allergies or rhinitis.  . furosemide (LASIX) 40 MG tablet Take 1-2 tablets daily as needed. (Patient taking differently: Take 40-80 mg by mouth daily as needed. )  . glucose blood (ACCU-CHEK AVIVA PLUS) test strip Check blood sugars once per day. DX: E11.9  . Lancets (ACCU-CHEK SOFT TOUCH) lancets Check blood sugars once per day. DX: E11.9  . lisinopril (PRINIVIL,ZESTRIL) 5 MG tablet Take 1 tablet (5 mg total) by mouth daily.  . metFORMIN (GLUCOPHAGE) 500 MG tablet TAKE 1 TABLET (500 MG TOTAL) BY MOUTH 2 (TWO) TIMES DAILY WITH A MEAL.  . tamsulosin (FLOMAX) 0.4 MG CAPS capsule Take 0.4 mg by mouth daily after supper.   . triamcinolone cream (KENALOG) 0.1 % Apply 1 application topically 2 (two) times daily as needed (for rash).   No current facility-administered medications on file prior to visit.      Chief Complaint  Patient presents with  . Follow-up    Wears CPAP nightly. Pt would like to know if the Amiodarone is safe for him to be taking with his breathing - any effect on lungs from the medication?  Pt has been put back on Lisinopril x 2-3 weeks ago. Flu shot today.     Sleep tests PSG 07/14/04 >> AHI 77 Auto CPAP 11/21/15 to 12/20/15 >> used on  30 of 30 nights with average 9 hrs 24 min.  Average AHI 1.3 with median CPAP 8 and 95 th percentile CPAP 11 cm H2O  Cardiac tests Echo 05/04/14 >> mild LVH, EF 50 to 55%, mild MR, mod LA dilation  Pulmonary tests PFT 11/01/14 >> FEV1 3.42 (104%), FEV1% 81, TLC 7.00 (99%), DLCO 55%, + BD HRCT chest 01/31/15 >> diffuse centrilobular GGO micronodules, mild airtrapping, mild centrilobular/paraseptal emphysema PFT 12/16/15 >> FEV1 3.13 (96%), FEV1% 79, TLC 6.59 (93%), DLCO 54%  Past medical hx A fib, HTN, HLD, Gout, Tinea versicolor, Prostate cancer  Past surgical hx, Allergies, Family hx, Social hx all reviewed.  Vital Signs BP 128/80 (BP Location: Left Arm, Cuff Size: Normal)   Pulse 64   Ht 5\' 11"  (1.803 m)   Wt 238 lb 3.2 oz (108 kg)   SpO2 94%   BMI 33.22 kg/m   History of Present Illness Ronald [name] is a 70 y.o. 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  He is down to 2 cigarettes per day.  His breathing is better.  He gets intermittent episode of chest tightness and then feels short of breath and dizzy.  He also gets sinus congestion.  These seem to happen more with environmental exposures (mowing the grass, windy weather).  He has been using flonase and allegra >> these help some.  He had PFT's early this month >> no significant change in DLCO.  He is using CPAP w/o difficulty.   Physical Exam  General - No distress ENT - No sinus tenderness, no oral exudate, no LAN Cardiac - s1s2 regular, no murmur Chest - No wheeze/rales/dullness Back - No focal tenderness Abd - Soft, non-tender Ext - No edema Neuro - Normal strength Skin - No rashes Psych - normal mood, and behavior   Assessment/Plan  Interstitial lung disease with clinical diagnosis of respiratory bronchiolitis. - continue smoking cessation efforts - monitor clinically  COPD with emphysema. - will have him try ventolin prn  Upper airway cough syndrome with post nasal drip  likely from seasonal allergies. - add astelin and nasal irrigation - continue flonase - change allegra to prn  Obstructive sleep apnea. - He is compliant with CPAP and reports benefit - continue auto CPAP  Atrial fibrillation. - recent PFT looks stable >> okay to continue amiodarone per cardiology   Patient Instructions  Ventolin HFA two puffs every 6 hours as needed for cough, wheezing, or chest congestion  Astelin one spray each nostril twice per day  Flonase one spray each nostril daily  Saline nasal spray twice per day  Flu shot today  Follow up in 6 months    Chesley Mires, MD [facility] Pager:  [phone] 12/22/2015, 11:35 AM

## 2016-01-02 ENCOUNTER — Encounter: Payer: Self-pay | Admitting: Family Medicine

## 2016-01-02 ENCOUNTER — Ambulatory Visit (INDEPENDENT_AMBULATORY_CARE_PROVIDER_SITE_OTHER): Payer: Federal, State, Local not specified - PPO | Admitting: Family Medicine

## 2016-01-02 VITALS — BP 120/80 | HR 82 | Temp 98.2°F | Ht 71.0 in | Wt 233.5 lb

## 2016-01-02 DIAGNOSIS — I4819 Other persistent atrial fibrillation: Secondary | ICD-10-CM

## 2016-01-02 DIAGNOSIS — I481 Persistent atrial fibrillation: Secondary | ICD-10-CM

## 2016-01-02 DIAGNOSIS — Z23 Encounter for immunization: Secondary | ICD-10-CM

## 2016-01-02 DIAGNOSIS — M549 Dorsalgia, unspecified: Secondary | ICD-10-CM

## 2016-01-02 DIAGNOSIS — M546 Pain in thoracic spine: Secondary | ICD-10-CM
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
## 2016-01-02 NOTE — Patient Instructions (Signed)
Try some topical heat Try topical such as Biofreeze and muscle massage.

## 2016-01-02 NOTE — Progress Notes (Signed)
Pre visit review using our clinic review tool, if applicable. No additional management support is needed unless otherwise documented below in the visit note. 

## 2016-01-04 DIAGNOSIS — H903 Sensorineural hearing loss, bilateral: Secondary | ICD-10-CM | POA: Diagnosis not present

## 2016-01-09 ENCOUNTER — Encounter: Payer: Self-pay | Admitting: Family Medicine

## 2016-01-11 DIAGNOSIS — H903 Sensorineural hearing loss, bilateral: Secondary | ICD-10-CM | POA: Diagnosis not present

## 2016-01-20 ENCOUNTER — Ambulatory Visit: Payer: Self-pay | Admitting: Orthopedic Surgery

## 2016-01-23 DIAGNOSIS — M542 Cervicalgia: Secondary | ICD-10-CM | POA: Diagnosis not present

## 2016-01-23 DIAGNOSIS — M5021 Other cervical disc displacement,  high cervical region: Secondary | ICD-10-CM | POA: Diagnosis not present

## 2016-01-24 ENCOUNTER — Encounter: Payer: Self-pay | Admitting: Family Medicine

## 2016-01-24 ENCOUNTER — Ambulatory Visit (INDEPENDENT_AMBULATORY_CARE_PROVIDER_SITE_OTHER): Payer: Federal, State, Local not specified - PPO | Admitting: Family Medicine

## 2016-01-24 VITALS — BP 140/90 | HR 77 | Temp 98.7°F | Ht 71.0 in | Wt 230.0 lb

## 2016-01-24 DIAGNOSIS — B029 Zoster without complications: Secondary | ICD-10-CM | POA: Diagnosis not present

## 2016-01-24 MED ORDER — VALACYCLOVIR HCL 1 G PO TABS: 1000.0000 mg | ORAL_TABLET | Freq: Three times a day (TID) | ORAL | 0 refills | Status: DC

## 2016-01-24 NOTE — Patient Instructions (Signed)

## 2016-01-24 NOTE — Progress Notes (Signed)
Pre visit review using our clinic review tool, if applicable. No additional management support is needed unless otherwise documented below in the visit note. 

## 2016-01-24 NOTE — Progress Notes (Signed)
Subjective:     Patient ID: Ronald [name], male   DOB: 10-27-1945, 70 y.o.   MRN: [id number]  HPI Patient seen with concern for shingles. Onset about 5 days ago rash right thoracic region. He went to orthopedist yesterday and they confirmed shingles. He initially had 6 out of 10 pain currently 3 out of 10. He is taking Tylenol which seems to control his pain fairly well. No history of shingles vaccine. No fevers or chills.  Past Medical History:  Diagnosis Date  . Allergic rhinitis   . Anticoagulant long-term use    eliquis  . Bilateral hydrocele   . COPD with emphysema [facility])    pulmologist-  dr Halford Chessman  . Dyspnea    occasional per pt  . History of colon polyps    tubular adenoma 2013  . History of gout    last episode early June 2017-- (feet)  . Hyperlipidemia   . Hypertension   . Nocturia    flomax has improved symptoms  . OSA on CPAP    per study 08-03-2004  Severe OSA  . Persistent atrial fibrillation [facility])    cardiologist --  paula ross--  post cardioversion 05-07-2014  . Prostate cancer [facility]) urologsit-  dr Alyson Ingles   Dx  2014--  stage T1c, Gleason 3+3=6, PSA 6.67--  Active surveillance  . 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 ([place])    pulmologist-  dr Halford Chessman  . Seasonal allergies   . Sigmoid diverticulosis   . Tinea versicolor   . Type 2 diabetes mellitus ([place])   . Urgency of urination   . Wears glasses    Past Surgical History:  Procedure Laterality Date  . CARDIOVERSION N/A 05/07/2014   Procedure: CARDIOVERSION;  Surgeon: [name], MD;  Location: [facility] ENDOSCOPY;  Service: Cardiovascular;  Laterality: N/A;  . CARDIOVERSION N/A 09/09/2014   Procedure: CARDIOVERSION;  Surgeon: Lelon Perla, MD;  Location: [facility] ENDOSCOPY;  Service: Cardiovascular;  Laterality: N/A;  successfully  . COLONOSCOPY  last one 06-07-2011  . HYDROCELE EXCISION Bilateral 09/26/2015   Procedure: HYDROCELECTOMY ADULT;  Surgeon: [name], MD;  Location: [facility];  Service: Urology;  Laterality: Bilateral;  . LAMINECTOMY AND MICRODISCECTOMY LUMBAR SPINE  12-23-2003   Left L5 -- S1  . LAPAROSCOPIC BILATERAL INGUINAL HERNIA REPAIR/  UMBILICAL HERNIA REPAIR WITH MESH/  ASPIRATION LEFT HYDROCELE  07-11-2012  . NM MYOVIEW LTD  05/18/2014   Low risk study. Normal perfusion: No ischemia or infarction. Mild LV dysfunction - 46% (does not correlate with echocardiographic EF of 50-55%)  . PROSTATE BIOPSY  05/15/12   Clinically both Lobes  . TEE WITHOUT CARDIOVERSION N/A 05/07/2014   Procedure: TRANSESOPHAGEAL ECHOCARDIOGRAM (TEE);  Surgeon: [name], MD;  Location: [facility] ENDOSCOPY;  Service: Cardiovascular;  Laterality: N/A;   mild atherosclerosis plaque of aorta,  mild AR, MR, and TR,  no cardiac source of emboli  . TONSILLECTOMY  as child  . TRANSTHORACIC ECHOCARDIOGRAM  05/01/2014   mild LVH; EF 50-55% (cannot measure diastolic Fxn with Afib), Mod LA Dilation, mild MR    reports that he has been smoking Cigarettes.  He has a 55.00 pack-year smoking history. He has never used smokeless tobacco. He reports that he drinks about 12.0 oz of alcohol per week . He reports that he does not use drugs. family history includes Breast cancer in his mother; Ovarian cancer in his sister. No Known Allergies   Review of Systems  Constitutional: Negative for  chills and fever.  Skin: Positive for rash.       Objective:   Physical Exam  Constitutional: He appears well-developed and well-nourished.  Cardiovascular: Normal rate and regular rhythm.   Pulmonary/Chest: Effort normal and breath sounds normal. No respiratory distress. He has no wheezes. He has no rales.  Skin: Rash noted.  Patient has vesicular rash dermatome distribution around T4 from midline of the back around toward the midline of the chest.       Assessment:     Shingles in a right thoracic distribution around T4    Plan:     -Valtrex 1 g 3 times a day for 7 days -Keep clean  with soap and water -Continue Tylenol as needed for pain and be in touch if his pain is not controlled with Tylenol  [name] MD [facility]

## 2016-02-01 DIAGNOSIS — S61200A Unspecified open wound of right index finger without damage to nail, initial encounter: Secondary | ICD-10-CM | POA: Diagnosis not present

## 2016-02-02 ENCOUNTER — Other Ambulatory Visit: Payer: Self-pay | Admitting: Cardiology

## 2016-02-07 DIAGNOSIS — M5021 Other cervical disc displacement,  high cervical region: Secondary | ICD-10-CM | POA: Diagnosis not present

## 2016-02-14 DIAGNOSIS — C61 Malignant neoplasm of prostate: Secondary | ICD-10-CM | POA: Diagnosis not present

## 2016-02-15 DIAGNOSIS — M5021 Other cervical disc displacement,  high cervical region: Secondary | ICD-10-CM | POA: Diagnosis not present

## 2016-02-15 DIAGNOSIS — Z4802 Encounter for removal of sutures: Secondary | ICD-10-CM | POA: Diagnosis not present

## 2016-02-23 ENCOUNTER — Other Ambulatory Visit: Payer: Self-pay | Admitting: Family Medicine

## 2016-03-14 ENCOUNTER — Other Ambulatory Visit: Payer: Self-pay

## 2016-03-14 MED ORDER — DILTIAZEM HCL ER COATED BEADS 360 MG PO CP24: 360.0000 mg | ORAL_CAPSULE | Freq: Every day | ORAL | 3 refills | Status: DC

## 2016-04-09 HISTORY — PX: EYE SURGERY: SHX253

## 2016-04-21 ENCOUNTER — Other Ambulatory Visit: Payer: Self-pay | Admitting: Family Medicine

## 2016-05-02 ENCOUNTER — Other Ambulatory Visit: Payer: Self-pay | Admitting: Family Medicine

## 2016-05-09 ENCOUNTER — Encounter: Payer: Self-pay | Admitting: Gastroenterology

## 2016-06-07 ENCOUNTER — Encounter: Payer: Self-pay | Admitting: Cardiology

## 2016-06-07 ENCOUNTER — Ambulatory Visit (INDEPENDENT_AMBULATORY_CARE_PROVIDER_SITE_OTHER): Payer: Federal, State, Local not specified - PPO | Admitting: Cardiology

## 2016-06-07 VITALS — BP 122/82 | HR 68 | Ht 71.0 in | Wt 238.4 lb

## 2016-06-07 DIAGNOSIS — R0609 Other forms of dyspnea: Secondary | ICD-10-CM | POA: Diagnosis not present

## 2016-06-07 DIAGNOSIS — E8881 Metabolic syndrome: Secondary | ICD-10-CM

## 2016-06-07 DIAGNOSIS — R600 Localized edema: Secondary | ICD-10-CM

## 2016-06-07 DIAGNOSIS — R0600 Dyspnea, unspecified: Secondary | ICD-10-CM

## 2016-06-07 DIAGNOSIS — E7849 Other hyperlipidemia: Secondary | ICD-10-CM

## 2016-06-07 DIAGNOSIS — I10 Essential (primary) hypertension: Secondary | ICD-10-CM

## 2016-06-07 DIAGNOSIS — F17210 Nicotine dependence, cigarettes, uncomplicated: Secondary | ICD-10-CM | POA: Diagnosis not present

## 2016-06-07 DIAGNOSIS — I481 Persistent atrial fibrillation: Secondary | ICD-10-CM

## 2016-06-07 DIAGNOSIS — E784 Other hyperlipidemia: Secondary | ICD-10-CM | POA: Diagnosis not present

## 2016-06-07 DIAGNOSIS — I4819 Other persistent atrial fibrillation: Secondary | ICD-10-CM

## 2016-06-07 DIAGNOSIS — Z79899 Other long term (current) drug therapy: Secondary | ICD-10-CM | POA: Diagnosis not present

## 2016-06-07 LAB — HEPATIC FUNCTION PANEL
ALBUMIN: 4 g/dL (ref 3.6–5.1)
ALK PHOS: 72 U/L (ref 40–115)
ALT: 44 U/L (ref 9–46)
AST: 32 U/L (ref 10–35)
BILIRUBIN TOTAL: 0.6 mg/dL (ref 0.2–1.2)
Bilirubin, Direct: 0.1 mg/dL (ref <=0.2)
Indirect Bilirubin: 0.5 mg/dL (ref 0.2–1.2)
TOTAL PROTEIN: 6 g/dL — AB (ref 6.1–8.1)

## 2016-06-07 LAB — TSH: TSH: 2.91 m[IU]/L (ref 0.40–4.50)

## 2016-06-07 LAB — LIPID PANEL
CHOL/HDL RATIO: 3.1 ratio (ref <5.0)
Cholesterol: 144 mg/dL (ref <200)
HDL: 46 mg/dL (ref >40)
LDL CALC: 62 mg/dL (ref <100)
Triglycerides: 179 mg/dL — ABNORMAL HIGH (ref <150)
VLDL: 36 mg/dL — AB (ref <30)

## 2016-06-07 NOTE — Assessment & Plan Note (Signed)
More related to venous insufficiency based on physical exam findings then CHF. Talked again about the importance of using support stockings. Continue current dose of Lasix. He says maybe once or twice a month he takes an additional dose.

## 2016-06-07 NOTE — Assessment & Plan Note (Signed)
He is doing better, but is not yet shown signs of contemplating actually quitting.

## 2016-06-07 NOTE — Assessment & Plan Note (Signed)
Discussed importance of continued low pressure control, exercise and weight loss. Nonischemic evaluation to date. We will then recheck lipid panel as he is on amiodarone plus atorvastatin.

## 2016-06-07 NOTE — Progress Notes (Addendum)
PCP: [name], MD  Clinic Note: Chief Complaint  Patient presents with  . Follow-up    6 months, pt denied chest pain and SOB, pt c/o swelling in legs and feet    HPI: Ronald [name] is a 71 y.o. male with a PMH below who presents today for This month follow-up for atrial fibrillation.Ronald [name] was last seen on [name]. He was doing relatively [name] with exception of congestion and coughing.  Recent Hospitalizations: None since his last visit  Studies Reviewed: None  Interval History: Ronald [name]. He really doesn't have much the way of any cardiac symptoms. He's been trying to get out and do more walking than he had been, but is noticing these NMR to getting back into shape. He is also recovering from hydrocele surgery etc. and building back his level of exercise tolerance. He really doesn't notice any rapid or irregular heartbeats or palpitations to suggest recurrence of A. fib. The main symptom he had when he did have A. fib was not palpitations, but was worsening dyspnea. That has not recurred. He has not had any worsening symptoms of coughing or wheezing. Doing better as far as active as since he changed his CPAP equipment. No heart or symptoms of PND, orthopnea or edema. No resting or exertional chest tightness or pressure/dyspnea to suggest angina. No lightheadedness, dizziness, wooziness, syncope/near syncope, or TIAs as amaurosis fugax.   No melena, hematochezia, hematuria, or epstaxis.  ROS: A comprehensive was performed. Review of Systems  HENT: Positive for congestion.   Respiratory: Positive for cough and shortness of breath (Pretty much at baseline). Negative for wheezing.   Musculoskeletal: Positive for joint pain (Relatively stable).  Neurological: Negative for dizziness.  Endo/Heme/Allergies: Bruises/bleeds easily.  Psychiatric/Behavioral: Negative for depression and memory loss. The patient does not have insomnia.     All other systems reviewed and are negative.   Past Medical History:  Diagnosis Date  . Allergic rhinitis   . Anticoagulant long-term use    eliquis  . Bilateral hydrocele   . COPD with emphysema [facility])    pulmologist-  dr Halford Chessman  . Dyspnea    occasional per pt  . History of colon polyps    tubular adenoma 2013  . History of gout    last episode early June 2017-- (feet)  . Hyperlipidemia   . Hypertension   . Nocturia    flomax has improved symptoms  . OSA on CPAP    per study 08-03-2004  Severe OSA  . Persistent atrial fibrillation [facility])    cardiologist --  paula ross--  post cardioversion 05-07-2014  . Prostate cancer [facility]) urologsit-  dr Alyson Ingles   Dx  2014--  stage T1c, Gleason 3+3=6, PSA 6.67--  Active surveillance  . 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 Seasonal allergies   . Sigmoid diverticulosis   . Tinea versicolor   . Type 2 diabetes mellitus ([place])   . Urgency of urination   . Wears glasses     Past Surgical History:  Procedure Laterality Date  . CARDIOVERSION N/A 05/07/2014   Procedure: CARDIOVERSION;  Surgeon: [name], MD;  Location: [facility] ENDOSCOPY;  Service: Cardiovascular;  Laterality: N/A;  . CARDIOVERSION N/A 09/09/2014   Procedure: CARDIOVERSION;  Surgeon: Lelon Perla, MD;  Location: [facility] ENDOSCOPY;  Service: Cardiovascular;  Laterality: N/A;  successfully  . COLONOSCOPY  last one 06-07-2011  . HYDROCELE  EXCISION Bilateral 09/26/2015   Procedure: HYDROCELECTOMY ADULT;  Surgeon: [name], MD;  Location: [facility];  Service: Urology;  Laterality: Bilateral;  . LAMINECTOMY AND MICRODISCECTOMY LUMBAR SPINE  12-23-2003   Left L5 -- S1  . LAPAROSCOPIC BILATERAL INGUINAL HERNIA REPAIR/  UMBILICAL HERNIA REPAIR WITH MESH/  ASPIRATION LEFT HYDROCELE  07-11-2012  . NM MYOVIEW LTD  05/18/2014   Low risk study. Normal perfusion: No ischemia or infarction. Mild LV dysfunction  - 46% (does not correlate with echocardiographic EF of 50-55%)  . PROSTATE BIOPSY  05/15/12   Clinically both Lobes  . TEE WITHOUT CARDIOVERSION N/A 05/07/2014   Procedure: TRANSESOPHAGEAL ECHOCARDIOGRAM (TEE);  Surgeon: [name], MD;  Location: [facility] ENDOSCOPY;  Service: Cardiovascular;  Laterality: N/A;   mild atherosclerosis plaque of aorta,  mild AR, MR, and TR,  no cardiac source of emboli  . TONSILLECTOMY  as child  . TRANSTHORACIC ECHOCARDIOGRAM  05/01/2014   mild LVH; EF 50-55% (cannot measure diastolic Fxn with Afib), Mod LA Dilation, mild MR    Current Meds  Medication Sig  . acetaminophen (TYLENOL) 500 MG tablet Take 500 mg by mouth every 6 (six) hours as needed for mild pain or moderate pain.  Marland Kitchen albuterol (VENTOLIN HFA) 108 (90 Base) MCG/ACT inhaler Inhale 2 puffs into the lungs every 6 (six) hours as needed for wheezing or shortness of breath.  Marland Kitchen amiodarone (PACERONE) 200 MG tablet Take 1 tablet (200 mg total) by mouth daily. (Patient taking differently: Take 200 mg by mouth every morning. )  . apixaban (ELIQUIS) 5 MG TABS tablet Take 5 mg by mouth 2 (two) times daily.  Marland Kitchen atorvastatin (LIPITOR) 10 MG tablet TAKE 1 TABLET (10 MG TOTAL) BY MOUTH EVERY EVENING.  Marland Kitchen azelastine (ASTELIN) 0.1 % nasal spray Place 1 spray into both nostrils 2 (two) times daily. Use in each nostril as directed  . diltiazem (CARDIZEM CD) 360 MG 24 hr capsule Take 1 capsule (360 mg total) by mouth daily.  . fluticasone (FLONASE) 50 MCG/ACT nasal spray Place 2 sprays into both nostrils daily as needed for allergies or rhinitis.  . furosemide (LASIX) 40 MG tablet Take 1-2 tablets daily as needed. (Patient taking differently: Take 40-80 mg by mouth daily as needed. )  . glucose blood (ACCU-CHEK AVIVA PLUS) test strip Check blood sugars once per day. DX: E11.9  . Lancets (ACCU-CHEK SOFT TOUCH) lancets Check blood sugars once per day. DX: E11.9  . metFORMIN (GLUCOPHAGE) 500 MG tablet TAKE 1 TABLET (500 MG TOTAL) BY  MOUTH 2 (TWO) TIMES DAILY WITH A MEAL.  . tamsulosin (FLOMAX) 0.4 MG CAPS capsule Take 0.4 mg by mouth daily after supper.   . triamcinolone cream (KENALOG) 0.1 % Apply 1 application topically 2 (two) times daily as needed (for rash).  . [DISCONTINUED] valACYclovir (VALTREX) 1000 MG tablet Take 1 tablet (1,000 mg total) by mouth 3 (three) times daily.    No Known Allergies  Social History   Social History  . Marital status: Married    Spouse name: N/A  . Number of children: N/A  . Years of education: N/A   Occupational History  . [employer]   Social History Main Topics  . Smoking status: Current Every Day Smoker    Packs/day: 1.00    Years: 55.00    Types: Cigarettes  . Smokeless tobacco: Never Used     Comment: trying to quit  mid Jan 2017 - as of 09-20-2015  down to ,  per pt 2-3 cigarettes per day  . Alcohol use 12.0 oz/week    14 Shots of liquor, 6 Standard drinks or equivalent per week     Comment: 2 drinks per day  . Drug use: No  . Sexual activity: Not Asked   Other Topics Concern  . None   Social History Narrative   Married 25 years   2 children but not with this wife    family history includes Breast cancer in his mother; Ovarian cancer in his sister.  Wt Readings from Last 3 Encounters:  06/07/16 108.1 kg (238 lb 6.4 oz)  01/24/16 104.3 kg (230 lb)  01/02/16 105.9 kg (233 lb 8 oz)    PHYSICAL EXAM BP 122/82   Pulse 68   Ht 5\' 11"  (1.803 m)   Wt 108.1 kg (238 lb 6.4 oz)   BMI 33.25 kg/m  General appearance: alert, cooperative, appears stated age, no distress and mildy obese Neck: no adenopathy, no carotid bruit and no JVD Lungs: clear to auscultation bilaterally, normal percussion bilaterally and non-labored Heart: RRR. S1& S2 normal, no murmur, click, rub or gallop; nondisplaced PMI Abdomen: soft, non-tender; bowel sounds normal; no masses, no organomegaly; mild truncal obesity Extremities: extremities normal, atraumatic, no cyanosis,  and edema -- trace to 1+ left greater than right ankle Pulses: 2+ and symmetric; Skin: normal and mobility and turgor normal ; mild spider veins on bilateral ankles. Neurologic: Mental status: Alert, oriented, thought content appropriate   Adult ECG Report  Rate: 68 ;  Rhythm: normal sinus rhythm, premature atrial contractions (PAC) and Otherwise normal axis, intervals and durations. Cannot exclude anterior MI with -Poor anterior wall R-wave progression. Low voltage in limb leads. Otherwise stable.;   Narrative Interpretation: Stable EKG   Other studies Reviewed: Additional studies/ records that were reviewed today include:  Recent Labs:  NONE AVAILABLE - followed by PCP   ASSESSMENT / PLAN: Problem List Items Addressed This Visit    Atrial fibrillation, persistent ([place]):  CHA2DS2-VASc Score - On Eliquis - Primary (Chronic)    Maintaining normal sinus rhythm on amiodarone. Heart rate looks little better off on beta blocker. No bleeding on ELIQUIS. He is followed by pulmonary medicine he should be following PFTs. We will continue to monitor LFTs and TFTs. He needs annual eye exams.      Relevant Orders   EKG 12-Lead (Completed)   Cigarette smoker two packs a day or less (Chronic)    He is doing better, but is not yet shown signs of contemplating actually quitting.      Edema of both legs    More related to venous insufficiency based on physical exam findings then CHF. Talked again about the importance of using support stockings. Continue current dose of Lasix. He says maybe once or twice a month he takes an additional dose.      Essential hypertension (Chronic)    [name]-controlled with current dose of ACE inhibitor.      Relevant Orders   EKG 12-Lead (Completed)   Exertional dyspnea (Chronic)    He definitely improved from a symptom standpoint with restoring normal sinus rhythm. Also the change at his CPAP equipment and that helped.  He still has exertional dyspnea, and  likely not cardiac.      Hyperlipidemia (Chronic)   Relevant Orders   EKG 12-Lead (Completed)   Lipid panel (Completed)   Metabolic syndrome (Chronic)    Discussed importance of continued low pressure control, exercise and weight loss. Nonischemic evaluation  to date. We will then recheck lipid panel as he is on amiodarone plus atorvastatin.      Relevant Orders   EKG 12-Lead (Completed)   Lipid panel (Completed)   On amiodarone therapy (Chronic)   Relevant Orders   EKG 12-Lead (Completed)   Lipid panel (Completed)   TSH (Completed)   Hepatic function panel (Completed)      Current medicines are reviewed at length with the patient today. (+/- concerns) None The following changes have been made: None  Patient Instructions  No change with current medications    Labs -  lipid  TSH Liver function panel Do not eat or drink the morning of the test   Your physician wants you to follow-up in 6 months with [name]. You will receive a reminder letter in the mail two months in advance. If you don't receive a letter, please call our office to schedule the follow-up appointment.   If you need a refill on your cardiac medications before your next appointment, please call your pharmacy.    Studies Ordered:   Orders Placed This Encounter  Procedures  . Lipid panel  . TSH  . Hepatic function panel  . EKG 12-Lead    ADDENDUM: Lab Results  Component Value Date   CHOL 144 06/07/2016   HDL 46 06/07/2016   LDLCALC 62 06/07/2016   LDLDIRECT 57.8 03/10/2012   TRIG 179 (H) 06/07/2016   CHOLHDL 3.1 06/07/2016   Lab Results  Component Value Date   CREATININE 1.22 11/30/2015   BUN 13 11/30/2015   NA 138 11/30/2015   K 3.4 (L) 11/30/2015   CL 103 11/30/2015   CO2 25 11/30/2015      Glenetta Hew, M.D., M.S. Interventional Cardiologist   Pager # [phone] Phone # [phone] [address] [place], [place] [address]

## 2016-06-07 NOTE — Assessment & Plan Note (Signed)
Maintaining normal sinus rhythm on amiodarone. Heart rate looks little better off on beta blocker. No bleeding on ELIQUIS. He is followed by pulmonary medicine he should be following PFTs. We will continue to monitor LFTs and TFTs. He needs annual eye exams.

## 2016-06-07 NOTE — Assessment & Plan Note (Signed)
Well-controlled with current dose of ACE inhibitor.

## 2016-06-07 NOTE — Assessment & Plan Note (Signed)
He definitely improved from a symptom standpoint with restoring normal sinus rhythm. Also the change at his CPAP equipment and that helped.  He still has exertional dyspnea, and likely not cardiac.

## 2016-06-07 NOTE — Patient Instructions (Signed)
No change with current medications    Labs -  lipid  TSH Liver function panel Do not eat or drink the morning of the test   Your physician wants you to follow-up in 6 months with [name]. You will receive a reminder letter in the mail two months in advance. If you don't receive a letter, please call our office to schedule the follow-up appointment.   If you need a refill on your cardiac medications before your next appointment, please call your pharmacy.

## 2016-06-08 ENCOUNTER — Telehealth: Payer: Self-pay | Admitting: Cardiology

## 2016-06-08 NOTE — Telephone Encounter (Signed)
Pt wants to know if his lab results are back from yesterday? °

## 2016-06-08 NOTE — Telephone Encounter (Signed)
Returned call to patient.He was calling for lab results done 06/07/2016.Advised results not available.Stated he is going to [place] next week call his cell # [phone].

## 2016-06-17 ENCOUNTER — Other Ambulatory Visit: Payer: Self-pay | Admitting: Cardiology

## 2016-06-19 ENCOUNTER — Ambulatory Visit: Payer: Federal, State, Local not specified - PPO | Admitting: Gastroenterology

## 2016-06-22 NOTE — Telephone Encounter (Signed)
Spoke to patient. Result given . Verbalized understanding  

## 2016-07-03 ENCOUNTER — Encounter: Payer: Self-pay | Admitting: Gastroenterology

## 2016-07-03 ENCOUNTER — Telehealth: Payer: Self-pay

## 2016-07-03 ENCOUNTER — Ambulatory Visit (INDEPENDENT_AMBULATORY_CARE_PROVIDER_SITE_OTHER): Payer: Federal, State, Local not specified - PPO | Admitting: Gastroenterology

## 2016-07-03 VITALS — BP 128/82 | HR 77 | Ht 71.0 in | Wt 239.0 lb

## 2016-07-03 DIAGNOSIS — Z860100 Personal history of colon polyps, unspecified: Secondary | ICD-10-CM | POA: Insufficient documentation

## 2016-07-03 DIAGNOSIS — Z7901 Long term (current) use of anticoagulants: Secondary | ICD-10-CM

## 2016-07-03 MED ORDER — NA SULFATE-K SULFATE-MG SULF 17.5-3.13-1.6 GM/177ML PO SOLN: ORAL | 0 refills | Status: DC

## 2016-07-03 NOTE — Patient Instructions (Addendum)
We have sent the following medications to your pharmacy for you to pick up at your convenience: [place] have been scheduled for a colonoscopy. Please follow written instructions given to you at your visit today.  Please pick up your prep supplies at the pharmacy within the next 1-3 days. If you use inhalers (even only as needed), please bring them with you on the day of your procedure. Your physician has requested that you go to www.startemmi.com and enter the access code given to you at your visit today. This web site gives a general overview about your procedure. However, you should still follow specific instructions given to you by our office regarding your preparation for the procedure.  Stop your Eliquis 2 days prior to procedure.   Thank you, Alonza Bogus, PA

## 2016-07-03 NOTE — Telephone Encounter (Signed)
Ronald [name] 03/23/1946 MRN [id number]  Dear [name] :  We have scheduled the above named patient for a(n) Colonoscopy procedure. Our records show that (s)he is on anticoagulation therapy.  Please advise as to whether the patient may come of their therapy of Eliquis 2 days prior to their procedure which is scheduled for 09/05/2016.  Please route your response to Sanda Linger, RN or fax response to [phone].  Sincerely,    [facility]

## 2016-07-03 NOTE — Telephone Encounter (Deleted)
.  dottieanti

## 2016-07-03 NOTE — Progress Notes (Signed)
Reviewed and agree with management plan.  Violanda Bobeck T. Kaydi Kley, MD FACG 

## 2016-07-03 NOTE — Progress Notes (Signed)
07/03/2016 REN ASPINALL [id number] 1946-02-01   HISTORY OF PRESENT ILLNESS:  This is a 71 year old male who is known to Dr. Fuller Plan for colonoscopy in 06/2011 at which time he was found to have a polyp that was removed and was a tubular adenoma on pathology.  Is was an 8 mm sessile polyp removed from the mid-transverse colon.  He also had moderate diverticulosis and internal hemorrhoids.  He is here today to schedule his recall colonoscopy.  He is on Eliquis for about the past 2 years or so for atrial fibrillation.  [name] is his cardiologist.  He says that he has held the Eliquis for another surgery previously without issues.  Patient does not have any GI complaints.   Past Medical History:  Diagnosis Date  . Allergic rhinitis   . Anticoagulant long-term use    eliquis  . Bilateral hydrocele   . COPD with emphysema [facility])    pulmologist-  dr Halford Chessman  . Dyspnea    occasional per pt  . History of colon polyps    tubular adenoma 2013  . History of gout    last episode early June 2017-- (feet)  . Hyperlipidemia   . Hypertension   . Nocturia    flomax has improved symptoms  . OSA on CPAP    per study 08-03-2004  Severe OSA  . Persistent atrial fibrillation [facility])    cardiologist --  paula ross--  post cardioversion 05-07-2014  . Prostate cancer [facility]) urologsit-  dr Alyson Ingles   Dx  2014--  stage T1c, Gleason 3+3=6, PSA 6.67--  Active surveillance  . 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 Seasonal allergies   . Sigmoid diverticulosis   . Tinea versicolor   . Type 2 diabetes mellitus ([place])   . Urgency of urination   . Wears glasses    Past Surgical History:  Procedure Laterality Date  . CARDIOVERSION N/A 05/07/2014   Procedure: CARDIOVERSION;  Surgeon: [name], MD;  Location: [facility] ENDOSCOPY;  Service: Cardiovascular;  Laterality: N/A;  . CARDIOVERSION N/A 09/09/2014   Procedure: CARDIOVERSION;  Surgeon: Lelon Perla, MD;  Location: [facility] ENDOSCOPY;  Service: Cardiovascular;  Laterality: N/A;  successfully  . COLONOSCOPY  last one 06-07-2011  . HYDROCELE EXCISION Bilateral 09/26/2015   Procedure: HYDROCELECTOMY ADULT;  Surgeon: [name], MD;  Location: [facility];  Service: Urology;  Laterality: Bilateral;  . LAMINECTOMY AND MICRODISCECTOMY LUMBAR SPINE  12-23-2003   Left L5 -- S1  . LAPAROSCOPIC BILATERAL INGUINAL HERNIA REPAIR/  UMBILICAL HERNIA REPAIR WITH MESH/  ASPIRATION LEFT HYDROCELE  07-11-2012  . NM MYOVIEW LTD  05/18/2014   Low risk study. Normal perfusion: No ischemia or infarction. Mild LV dysfunction - 46% (does not correlate with echocardiographic EF of 50-55%)  . PROSTATE BIOPSY  05/15/12   Clinically both Lobes  . TEE WITHOUT CARDIOVERSION N/A 05/07/2014   Procedure: TRANSESOPHAGEAL ECHOCARDIOGRAM (TEE);  Surgeon: [name], MD;  Location: [facility] ENDOSCOPY;  Service: Cardiovascular;  Laterality: N/A;   mild atherosclerosis plaque of aorta,  mild AR, MR, and TR,  no cardiac source of emboli  . TONSILLECTOMY  as child  . TRANSTHORACIC ECHOCARDIOGRAM  05/01/2014   mild LVH; EF 50-55% (cannot measure diastolic Fxn with Afib), Mod LA Dilation, mild MR    reports that he has been smoking Cigarettes.  He has a 55.00 pack-year smoking history. He has  never used smokeless tobacco. He reports that he drinks about 12.0 oz of alcohol per week . He reports that he does not use drugs. family history includes Breast cancer in his mother; Ovarian cancer in his sister. No Known Allergies    Outpatient Encounter Prescriptions as of 07/03/2016  Medication Sig  . acetaminophen (TYLENOL) 500 MG tablet Take 500 mg by mouth every 6 (six) hours as needed for mild pain or moderate pain.  Marland Kitchen albuterol (VENTOLIN HFA) 108 (90 Base) MCG/ACT inhaler Inhale 2 puffs into the lungs every 6 (six) hours as needed for wheezing or shortness of breath.  Marland Kitchen amiodarone (PACERONE) 200 MG tablet Take 1  tablet (200 mg total) by mouth daily. (Patient taking differently: Take 200 mg by mouth every morning. )  . apixaban (ELIQUIS) 5 MG TABS tablet Take 5 mg by mouth 2 (two) times daily.  Marland Kitchen atorvastatin (LIPITOR) 10 MG tablet TAKE 1 TABLET (10 MG TOTAL) BY MOUTH EVERY EVENING.  Marland Kitchen azelastine (ASTELIN) 0.1 % nasal spray Place 1 spray into both nostrils 2 (two) times daily. Use in each nostril as directed  . diltiazem (CARDIZEM CD) 360 MG 24 hr capsule Take 1 capsule (360 mg total) by mouth daily.  Marland Kitchen ELIQUIS 5 MG TABS tablet TAKE 1 TABLET TWICE A DAY  . fluticasone (FLONASE) 50 MCG/ACT nasal spray Place 2 sprays into both nostrils daily as needed for allergies or rhinitis.  . furosemide (LASIX) 40 MG tablet Take 1-2 tablets daily as needed. (Patient taking differently: Take 40-80 mg by mouth daily as needed. )  . glucose blood (ACCU-CHEK AVIVA PLUS) test strip Check blood sugars once per day. DX: E11.9  . Lancets (ACCU-CHEK SOFT TOUCH) lancets Check blood sugars once per day. DX: E11.9  . metFORMIN (GLUCOPHAGE) 500 MG tablet TAKE 1 TABLET (500 MG TOTAL) BY MOUTH 2 (TWO) TIMES DAILY WITH A MEAL.  . tamsulosin (FLOMAX) 0.4 MG CAPS capsule Take 0.4 mg by mouth daily after supper.   . triamcinolone cream (KENALOG) 0.1 % Apply 1 application topically 2 (two) times daily as needed (for rash).  Marland Kitchen lisinopril (PRINIVIL,ZESTRIL) 5 MG tablet Take 1 tablet (5 mg total) by mouth daily.   No facility-administered encounter medications on file as of 07/03/2016.      REVIEW OF SYSTEMS  : All other systems reviewed and negative except where noted in the History of Present Illness.   PHYSICAL EXAM: BP 128/82   Pulse 77   Ht 5\' 11"  (1.803 m)   Wt 239 lb (108.4 kg)   SpO2 96%   BMI 33.33 kg/m  General: Well developed white male in no acute distress Head: Normocephalic and atraumatic Eyes:  Sclerae anicteric, conjunctiva pink. Ears: Normal auditory acuity Lungs: Clear throughout to auscultation Heart:  Regular rate and rhythm Abdomen: Soft, non-distended. Normal bowel sounds.  Non-tender. Rectal:  Will be done at the time of colonoscopy. Musculoskeletal: Symmetrical with no gross deformities  Skin: No lesions on visible extremities Extremities: No edema  Neurological: Alert oriented x 4, grossly non-focal Psychological:  Alert and cooperative. Normal mood and affect  ASSESSMENT AND PLAN: -Personal history of colon polyps:  Had a tubular adenoma in 06/2011.  Will schedule for colonoscopy with Dr. Fuller Plan. -Chronic anticoagulation with Eliquis for atrial fibrillation:  Will hold Eliquis for 2 days prior to endoscopic procedures - will instruct when and how to resume after procedure. Benefits and risks of procedure explained including risks of bleeding, perforation, infection, missed lesions, reactions to medications and  possible need for hospitalization and surgery for complications. Additional rare but real risk of stroke or other vascular clotting events off of Eliquis also explained and need to seek urgent help if any signs of these problems occur. Will communicate by phone or EMR with patient's prescribing provider, [name], to confirm that holding Elquis is reasonable in this case.    CC:  [name], MD

## 2016-07-03 NOTE — Telephone Encounter (Signed)
Error   This encounter was created in error - please disregard. 

## 2016-07-05 ENCOUNTER — Other Ambulatory Visit: Payer: Self-pay | Admitting: Family Medicine

## 2016-07-05 NOTE — Telephone Encounter (Signed)
Hold Eliquis 48 hr pre-procedure.  If no Biopsy - restart next day.  If Biopsy - restart 2nd day (or per GI recommendation)  Glenetta Hew, MD

## 2016-07-09 NOTE — Telephone Encounter (Signed)
The pt has been advised to hold eliquis for 2 days prior to procedure.  Pt verbalized understanding and will call with any questions.

## 2016-07-16 DIAGNOSIS — G4733 Obstructive sleep apnea (adult) (pediatric): Secondary | ICD-10-CM | POA: Diagnosis not present

## 2016-08-14 ENCOUNTER — Other Ambulatory Visit: Payer: Self-pay | Admitting: Family Medicine

## 2016-08-15 ENCOUNTER — Other Ambulatory Visit: Payer: Self-pay | Admitting: Family Medicine

## 2016-09-05 ENCOUNTER — Encounter: Payer: Self-pay | Admitting: Gastroenterology

## 2016-09-05 ENCOUNTER — Ambulatory Visit (AMBULATORY_SURGERY_CENTER): Payer: Federal, State, Local not specified - PPO | Admitting: Gastroenterology

## 2016-09-05 VITALS — BP 128/88 | HR 64 | Temp 97.7°F | Resp 16 | Ht 71.0 in | Wt 239.0 lb

## 2016-09-05 DIAGNOSIS — K621 Rectal polyp: Secondary | ICD-10-CM

## 2016-09-05 DIAGNOSIS — D124 Benign neoplasm of descending colon: Secondary | ICD-10-CM

## 2016-09-05 DIAGNOSIS — D129 Benign neoplasm of anus and anal canal: Secondary | ICD-10-CM

## 2016-09-05 DIAGNOSIS — D120 Benign neoplasm of cecum: Secondary | ICD-10-CM | POA: Diagnosis not present

## 2016-09-05 DIAGNOSIS — K635 Polyp of colon: Secondary | ICD-10-CM

## 2016-09-05 DIAGNOSIS — D128 Benign neoplasm of rectum: Secondary | ICD-10-CM

## 2016-09-05 DIAGNOSIS — D126 Benign neoplasm of colon, unspecified: Secondary | ICD-10-CM

## 2016-09-05 DIAGNOSIS — D125 Benign neoplasm of sigmoid colon: Secondary | ICD-10-CM

## 2016-09-05 DIAGNOSIS — Z86010 Personal history of colonic polyps: Secondary | ICD-10-CM

## 2016-09-05 DIAGNOSIS — Z1211 Encounter for screening for malignant neoplasm of colon: Secondary | ICD-10-CM | POA: Diagnosis not present

## 2016-09-05 MED ORDER — SODIUM CHLORIDE 0.9 % IV SOLN: 500.0000 mL | INTRAVENOUS | Status: DC

## 2016-09-05 NOTE — Op Note (Signed)
[place] Patient Name: Ronald [name] Procedure Date: 09/05/2016 7:57 AM MRN: [id number] Endoscopist: Ladene Artist , MD Age: 71 Referring MD:  Date of Birth: 05/31/1945 Gender: Male Account #: [id number] Procedure:                Colonoscopy Indications:              Surveillance: Personal history of adenomatous                            polyps on last colonoscopy 5 years ago Medicines:                Monitored Anesthesia Care Procedure:                Pre-Anesthesia Assessment:                           - Prior to the procedure, a History and Physical                            was performed, and patient medications and                            allergies were reviewed. The patient's tolerance of                            previous anesthesia was also reviewed. The risks                            and benefits of the procedure and the sedation                            options and risks were discussed with the patient.                            All questions were answered, and informed consent                            was obtained. Prior Anticoagulants: The patient has                            taken Eliquis (apixaban), last dose was 2 days                            prior to procedure. ASA Grade Assessment: III - A                            patient with severe systemic disease. After                            reviewing the risks and benefits, the patient was                            deemed in satisfactory condition to undergo the  procedure.                           After obtaining informed consent, the colonoscope                            was passed under direct vision. Throughout the                            procedure, the patient's blood pressure, pulse, and                            oxygen saturations were monitored continuously. The                            Model PCF-[id number]) scope was introduced                   through the anus and advanced to the the cecum,                            identified by appendiceal orifice and ileocecal                            valve. The ileocecal valve, appendiceal orifice,                            and rectum were photographed. The quality of the                            bowel preparation was good. The colonoscopy was                            performed without difficulty. The patient tolerated                            the procedure well. Scope In: 8:07:59 AM Scope Out: 8:25:45 AM Scope Withdrawal Time: 0 hours 16 minutes 8 seconds  Total Procedure Duration: 0 hours 17 minutes 46 seconds  Findings:                 The perianal and digital rectal examinations were                            normal.                           Six sessile polyps were found in the rectum (3),                            sigmoid colon (1), descending colon (1) and                            ileocecal valve (1). The polyps were 5 to 7 mm in  size. These polyps were removed with a cold snare.                            Resection and retrieval were complete except for                            one 5 mm rectal polyp.                           Multiple small-mouthed diverticula were found in                            the right colon. There was no evidence of                            diverticular bleeding.                           Many medium-mouthed diverticula were found in the                            left colon. There was narrowing of the colon in                            association with the diverticular opening. There                            was evidence of diverticular spasm. There was no                            evidence of diverticular bleeding.                           Internal hemorrhoids were found during                            retroflexion. The hemorrhoids were medium-sized and                            Grade I  (internal hemorrhoids that do not prolapse).                           The exam was otherwise without abnormality on                            direct and retroflexion views. Complications:            No immediate complications. Estimated blood loss:                            None. Estimated Blood Loss:     Estimated blood loss: none. Impression:               - Six 5 to 7 mm polyps in the rectum, in the  sigmoid colon, in the descending colon and at the                            ileocecal valve, removed with a cold snare.                            Resected and retrieved.                           - Mild diverticulosis in the right colon.                           - Moderate diverticulosis in the left colon.                           - Internal hemorrhoids.                           - The examination was otherwise normal on direct                            and retroflexion views. Recommendation:           - Repeat colonoscopy in 3 - 5 years for                            surveillance pending pathology review.                           - Resume Eliquis (apixaban) in 2 days at prior                            dose. Refer to managing physician for further                            adjustment of therapy.                           - Patient has a contact number available for                            emergencies. The signs and symptoms of potential                            delayed complications were discussed with the                            patient. Return to normal activities tomorrow.                            Written discharge instructions were provided to the                            patient.                           -  High fiber diet.                           - Continue present medications.                           - Await pathology results.                           - No aspirin, ibuprofen, naproxen, or other                             non-steroidal anti-inflammatory drugs for 2 weeks                            after polyp removal. Ladene Artist, MD 09/05/2016 8:31:42 AM This report has been signed electronically.

## 2016-09-05 NOTE — Progress Notes (Signed)
Called to room to assist during endoscopic procedure.  Patient ID and intended procedure confirmed with present staff. Received instructions for my participation in the procedure from the performing physician.  

## 2016-09-05 NOTE — Progress Notes (Signed)
Pt's states no medical or surgical changes since previsit or office visit. 

## 2016-09-05 NOTE — Patient Instructions (Signed)
YOU HAD AN ENDOSCOPIC PROCEDURE TODAY AT [facility]:   Refer to the procedure report that was given to you for any specific questions about what was found during the examination.  If the procedure report does not answer your questions, please call your gastroenterologist to clarify.  If you requested that your care partner not be given the details of your procedure findings, then the procedure report has been included in a sealed envelope for you to review at your convenience later.  YOU SHOULD EXPECT: Some feelings of bloating in the abdomen. Passage of more gas than usual.  Walking can help get rid of the air that was put into your GI tract during the procedure and reduce the bloating. If you had a lower endoscopy (such as a colonoscopy or flexible sigmoidoscopy) you may notice spotting of blood in your stool or on the toilet paper. If you underwent a bowel prep for your procedure, you may not have a normal bowel movement for a few days.  Please Note:  You might notice some irritation and congestion in your nose or some drainage.  This is from the oxygen used during your procedure.  There is no need for concern and it should clear up in a day or so.  SYMPTOMS TO REPORT IMMEDIATELY:   Following lower endoscopy (colonoscopy or flexible sigmoidoscopy):  Excessive amounts of blood in the stool  Significant tenderness or worsening of abdominal pains  Swelling of the abdomen that is new, acute  Fever of 100F or higher   For urgent or emergent issues, a gastroenterologist can be reached at any hour by calling [phone].   DIET:  We do recommend a small meal at first, but then you may proceed to your regular diet.  Drink plenty of fluids but you should avoid alcoholic beverages for 24 hours. Try to increase the fiber in your diet, and drink plenty of water.  ACTIVITY:  You should plan to take it easy for the rest of today and you should NOT DRIVE or use heavy machinery until  tomorrow (because of the sedation medicines used during the test).    FOLLOW UP: Our staff will call the number listed on your records the next business day following your procedure to check on you and address any questions or concerns that you may have regarding the information given to you following your procedure. If we do not reach you, we will leave a message.  However, if you are feeling well and you are not experiencing any problems, there is no need to return our call.  We will assume that you have returned to your regular daily activities without incident.  If any biopsies were taken you will be contacted by phone or by letter within the next 1-3 weeks.  Please call us at [phone] if you have not heard about the biopsies in 3 weeks.    SIGNATURES/CONFIDENTIALITY: You and/or your care partner have signed paperwork which will be entered into your electronic medical record.  These signatures attest to the fact that that the information above on your After Visit Summary has been reviewed and is understood.  Full responsibility of the confidentiality of this discharge information lies with you and/or your care-partner.  Please take your eliquis in two days per Dr. Fuller Plan.  Do not take NSAIDS for two weeks ie: aspirin, aleve and ibuprofen.   Read all of the handouts given to you by your recovery room nurse.

## 2016-09-05 NOTE — Progress Notes (Signed)
A and O x3. Report to RN. Tolerated MAC anesthesia well.

## 2016-09-06 ENCOUNTER — Telehealth: Payer: Self-pay | Admitting: *Deleted

## 2016-09-06 NOTE — Telephone Encounter (Signed)
  Follow up Call-  Call back number 09/05/2016  Post procedure Call Back phone  # [phone]  Permission to leave phone message Yes  Some recent data might be hidden     Patient questions:  Do you have a fever, pain , or abdominal swelling? No. Pain Score  0 *  Have you tolerated food without any problems? Yes.    Have you been able to return to your normal activities? Yes.    Do you have any questions about your discharge instructions: Diet   No. Medications  No. Follow up visit  No.  Do you have questions or concerns about your Care? No.  Actions: * If pain score is 4 or above: No action needed, pain <4.  Spoke with wife she stated"he did just fine".

## 2016-09-08 ENCOUNTER — Other Ambulatory Visit: Payer: Self-pay | Admitting: Family Medicine

## 2016-09-11 DIAGNOSIS — E119 Type 2 diabetes mellitus without complications: Secondary | ICD-10-CM | POA: Diagnosis not present

## 2016-09-11 DIAGNOSIS — H2513 Age-related nuclear cataract, bilateral: Secondary | ICD-10-CM | POA: Diagnosis not present

## 2016-09-20 ENCOUNTER — Encounter: Payer: Self-pay | Admitting: Gastroenterology

## 2016-10-02 ENCOUNTER — Other Ambulatory Visit (HOSPITAL_COMMUNITY): Payer: Self-pay | Admitting: Nurse Practitioner

## 2016-10-02 NOTE — Telephone Encounter (Signed)
REFILL 

## 2016-10-03 DIAGNOSIS — H2513 Age-related nuclear cataract, bilateral: Secondary | ICD-10-CM | POA: Diagnosis not present

## 2016-11-06 DIAGNOSIS — H25811 Combined forms of age-related cataract, right eye: Secondary | ICD-10-CM | POA: Diagnosis not present

## 2016-11-06 DIAGNOSIS — H2511 Age-related nuclear cataract, right eye: Secondary | ICD-10-CM | POA: Diagnosis not present

## 2016-11-20 DIAGNOSIS — H2511 Age-related nuclear cataract, right eye: Secondary | ICD-10-CM | POA: Diagnosis not present

## 2016-11-20 DIAGNOSIS — H2512 Age-related nuclear cataract, left eye: Secondary | ICD-10-CM | POA: Diagnosis not present

## 2016-11-20 DIAGNOSIS — H25812 Combined forms of age-related cataract, left eye: Secondary | ICD-10-CM | POA: Diagnosis not present

## 2016-12-02 ENCOUNTER — Other Ambulatory Visit: Payer: Self-pay | Admitting: Cardiology

## 2016-12-03 NOTE — Telephone Encounter (Signed)
Rx request sent to pharmacy.  

## 2016-12-05 ENCOUNTER — Encounter: Payer: Self-pay | Admitting: Family Medicine

## 2016-12-05 ENCOUNTER — Encounter: Payer: Self-pay | Admitting: Cardiology

## 2016-12-05 ENCOUNTER — Ambulatory Visit (INDEPENDENT_AMBULATORY_CARE_PROVIDER_SITE_OTHER): Payer: Federal, State, Local not specified - PPO | Admitting: Cardiology

## 2016-12-05 ENCOUNTER — Ambulatory Visit (INDEPENDENT_AMBULATORY_CARE_PROVIDER_SITE_OTHER): Payer: Federal, State, Local not specified - PPO | Admitting: Family Medicine

## 2016-12-05 VITALS — BP 120/70 | HR 79 | Temp 98.6°F | Wt 230.0 lb

## 2016-12-05 VITALS — BP 124/80 | HR 69 | Ht 71.0 in | Wt 230.0 lb

## 2016-12-05 DIAGNOSIS — Z7901 Long term (current) use of anticoagulants: Secondary | ICD-10-CM

## 2016-12-05 DIAGNOSIS — E7849 Other hyperlipidemia: Secondary | ICD-10-CM

## 2016-12-05 DIAGNOSIS — J84115 Respiratory bronchiolitis interstitial lung disease: Secondary | ICD-10-CM

## 2016-12-05 DIAGNOSIS — Z23 Encounter for immunization: Secondary | ICD-10-CM | POA: Diagnosis not present

## 2016-12-05 DIAGNOSIS — I481 Persistent atrial fibrillation: Secondary | ICD-10-CM

## 2016-12-05 DIAGNOSIS — F17210 Nicotine dependence, cigarettes, uncomplicated: Secondary | ICD-10-CM

## 2016-12-05 DIAGNOSIS — E784 Other hyperlipidemia: Secondary | ICD-10-CM

## 2016-12-05 DIAGNOSIS — I10 Essential (primary) hypertension: Secondary | ICD-10-CM

## 2016-12-05 DIAGNOSIS — E1140 Type 2 diabetes mellitus with diabetic neuropathy, unspecified: Secondary | ICD-10-CM

## 2016-12-05 DIAGNOSIS — R0981 Nasal congestion: Secondary | ICD-10-CM | POA: Diagnosis not present

## 2016-12-05 DIAGNOSIS — R7982 Elevated C-reactive protein (CRP): Secondary | ICD-10-CM | POA: Diagnosis not present

## 2016-12-05 DIAGNOSIS — Z79899 Other long term (current) drug therapy: Secondary | ICD-10-CM

## 2016-12-05 DIAGNOSIS — L570 Actinic keratosis: Secondary | ICD-10-CM | POA: Diagnosis not present

## 2016-12-05 DIAGNOSIS — R700 Elevated erythrocyte sedimentation rate: Secondary | ICD-10-CM | POA: Diagnosis not present

## 2016-12-05 DIAGNOSIS — I4819 Other persistent atrial fibrillation: Secondary | ICD-10-CM

## 2016-12-05 LAB — BASIC METABOLIC PANEL
BUN: 11 mg/dL (ref 6–23)
CHLORIDE: 101 meq/L (ref 96–112)
CO2: 31 meq/L (ref 19–32)
CREATININE: 1.18 mg/dL (ref 0.40–1.50)
Calcium: 8.8 mg/dL (ref 8.4–10.5)
GFR: 64.58 mL/min (ref >60.00)
GLUCOSE: 206 mg/dL — AB (ref 70–99)
Potassium: 3.7 mEq/L (ref 3.5–5.1)
Sodium: 138 mEq/L (ref 135–145)

## 2016-12-05 LAB — SEDIMENTATION RATE: Sed Rate: 6 mm/hr (ref 0–30)

## 2016-12-05 LAB — C-REACTIVE PROTEIN: CRP: 14.8 mg/L — ABNORMAL HIGH (ref 0.0–4.9)

## 2016-12-05 LAB — POCT GLYCOSYLATED HEMOGLOBIN (HGB A1C): HEMOGLOBIN A1C: 6.7

## 2016-12-05 MED ORDER — MONTELUKAST SODIUM 10 MG PO TABS: 10.0000 mg | ORAL_TABLET | Freq: Every day | ORAL | 3 refills | Status: DC

## 2016-12-05 NOTE — Patient Instructions (Signed)
Consider elevate head of bed 4-6 inches Start the Singulair one a day Touch base in 2-3 weeks if no better

## 2016-12-05 NOTE — Progress Notes (Signed)
PCP: [name], MD  Clinic Note: Chief Complaint  Patient presents with  . Follow-up    Afib    HPI: Ronald [name] is a 71 y.o. male with a PMH below who presents today for Six-month follow-up for A. Fib. After initially starting off asymptomatic with A. Fib,. We therefore resorted to  Amiodarone for rate/rhythm control  Ronald [name] was last seen on March 2018 - he noted that he was doing very well with no active cardiac symptoms.   Recent Hospitalizations: He had cataract surgery since his last visit.  Studies Personally Reviewed - (if available, images/films reviewed: From Epic Chart or Care Everywhere)  none  Interval History: Ronald [name] returns today still doing relatively well with no symptoms of recurrent A Fib.  Only occasional "skipped beats", but nothing to suggest an arrhythmia.  He does get "a bit winded with exertion", but is not very active to being with.  He attributes a lot of his difficulties with breathing to his chronic allergic rhinitis. No anginal CP with rest or exertion.  No PND, orthopnea or edema. No palpitations, lightheadedness, dizziness, weakness or syncope/near syncope. No TIA/amaurosis fugax symptoms. No melena, hematochezia, hematuria, or epstaxis. No claudication.  ROS: A comprehensive was performed. Review of Systems  Constitutional: Negative for malaise/fatigue.  HENT: Positive for congestion (and sunny nose).   Eyes: Negative for blurred vision.  Respiratory: Positive for cough and shortness of breath. Negative for wheezing.        Allergy related  Gastrointestinal: Negative for constipation and heartburn.  Musculoskeletal: Positive for joint pain.  Endo/Heme/Allergies: Positive for environmental allergies. Bruises/bleeds easily.  Psychiatric/Behavioral: Negative.   All other systems reviewed and are negative.  I have reviewed and (if needed) personally updated the patient's problem list, medications, allergies, past medical and  surgical history, social and family history.   Past Medical History:  Diagnosis Date  . Allergic rhinitis   . Anticoagulant long-term use    eliquis  . Bilateral hydrocele   . COPD with emphysema [facility])    pulmologist-  dr Halford Chessman  . Dyspnea    occasional per pt  . History of colon polyps    tubular adenoma 2013  . History of gout    last episode early June 2017-- (feet)  . Hyperlipidemia   . Hypertension   . Nocturia    flomax has improved symptoms  . OSA on CPAP    per study 08-03-2004  Severe OSA  . Persistent atrial fibrillation [facility])    cardiologist --  paula ross--  post cardioversion 05-07-2014  . Prostate cancer [facility]) urologsit-  dr Alyson Ingles   Dx  2014--  stage T1c, Gleason 3+3=6, PSA 6.67--  Active surveillance  . 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 Seasonal allergies   . Sigmoid diverticulosis   . Tinea versicolor   . Type 2 diabetes mellitus ([place])   . Urgency of urination   . Wears glasses     Past Surgical History:  Procedure Laterality Date  . CARDIOVERSION N/A 05/07/2014   Procedure: CARDIOVERSION;  Surgeon: [name], MD;  Location: [facility] ENDOSCOPY;  Service: Cardiovascular;  Laterality: N/A;  . CARDIOVERSION N/A 09/09/2014   Procedure: CARDIOVERSION;  Surgeon: Lelon Perla, MD;  Location: [facility] ENDOSCOPY;  Service: Cardiovascular;  Laterality: N/A;  successfully  . COLONOSCOPY  last one 06-07-2011  . HYDROCELE EXCISION Bilateral 09/26/2015   Procedure: HYDROCELECTOMY ADULT;  Surgeon: Saralyn Pilar  Alita Chyle, MD;  Location: [facility];  Service: Urology;  Laterality: Bilateral;  . LAMINECTOMY AND MICRODISCECTOMY LUMBAR SPINE  12-23-2003   Left L5 -- S1  . LAPAROSCOPIC BILATERAL INGUINAL HERNIA REPAIR/  UMBILICAL HERNIA REPAIR WITH MESH/  ASPIRATION LEFT HYDROCELE  07-11-2012  . NM MYOVIEW LTD  05/18/2014   Low risk study. Normal perfusion: No ischemia or infarction. Mild LV dysfunction -  46% (does not correlate with echocardiographic EF of 50-55%)  . PROSTATE BIOPSY  05/15/12   Clinically both [place]  . TEE WITHOUT CARDIOVERSION N/A 05/07/2014   Procedure: TRANSESOPHAGEAL ECHOCARDIOGRAM (TEE);  Surgeon: [name], MD;  Location: [facility] ENDOSCOPY;  Service: Cardiovascular;  Laterality: N/A;   mild atherosclerosis plaque of aorta,  mild AR, MR, and TR,  no cardiac source of emboli  . TONSILLECTOMY  as child  . TRANSTHORACIC ECHOCARDIOGRAM  05/01/2014   mild LVH; EF 50-55% (cannot measure diastolic Fxn with Afib), Mod LA Dilation, mild MR    Current Meds  Medication Sig  . ACCU-CHEK AVIVA PLUS test strip CHECK BLOOD SUGARS ONCE PER DAY. DX: E11.9  . acetaminophen (TYLENOL) 500 MG tablet Take 500 mg by mouth every 6 (six) hours as needed for mild pain or moderate pain.  Marland Kitchen albuterol (VENTOLIN HFA) 108 (90 Base) MCG/ACT inhaler Inhale 2 puffs into the lungs every 6 (six) hours as needed for wheezing or shortness of breath.  Marland Kitchen amiodarone (PACERONE) 200 MG tablet TAKE 1 TABLET (200 MG TOTAL) BY MOUTH DAILY.  Marland Kitchen apixaban (ELIQUIS) 5 MG TABS tablet Take 5 mg by mouth 2 (two) times daily.  Marland Kitchen atorvastatin (LIPITOR) 10 MG tablet TAKE 1 TABLET (10 MG TOTAL) BY MOUTH EVERY EVENING.  Marland Kitchen azelastine (ASTELIN) 0.1 % nasal spray Place 1 spray into both nostrils 2 (two) times daily. Use in each nostril as directed  . diltiazem (CARDIZEM CD) 360 MG 24 hr capsule Take 1 capsule (360 mg total) by mouth daily.  Marland Kitchen ELIQUIS 5 MG TABS tablet TAKE 1 TABLET TWICE A DAY  . fluticasone (FLONASE) 50 MCG/ACT nasal spray Place 2 sprays into both nostrils daily as needed for allergies or rhinitis.  . furosemide (LASIX) 40 MG tablet TAKE 1-2 TABLETS DAILY AS NEEDED.  Marland Kitchen Lancets (ACCU-CHEK SOFT TOUCH) lancets Check blood sugars once per day. DX: E11.9  . lisinopril (PRINIVIL,ZESTRIL) 5 MG tablet TAKE 1 TABLET BY MOUTH DAILY  . tamsulosin (FLOMAX) 0.4 MG CAPS capsule Take 0.4 mg by mouth daily after supper.   .  triamcinolone cream (KENALOG) 0.1 % APPLY 1 APPLICATION TOPICALLY 2 (TWO) TIMES DAILY AS NEEDED (FOR RASH).  . [DISCONTINUED] metFORMIN (GLUCOPHAGE) 500 MG tablet TAKE 1 TABLET (500 MG TOTAL) BY MOUTH 2 (TWO) TIMES DAILY WITH A MEAL.   Current Facility-Administered Medications for the 12/05/16 encounter (Office Visit) with Leonie Man, MD  Medication  . 0.9 %  sodium chloride infusion    No Known Allergies  Social History   Social History  . Marital status: Married    Spouse name: N/A  . Number of children: N/A  . Years of education: N/A   Occupational History  . [employer]   Social History Main Topics  . Smoking status: Current Every Day Smoker    Packs/day: 1.00    Years: 55.00    Types: Cigarettes  . Smokeless tobacco: Never Used     Comment: trying to quit  mid Jan 2017 - as of 09-20-2015  down to , per pt  2-3 cigarettes per day  . Alcohol use 12.0 oz/week    14 Shots of liquor, 6 Standard drinks or equivalent per week     Comment: 2 drinks per day  . Drug use: No  . Sexual activity: Not Asked   Other Topics Concern  . None   Social History Narrative   Married 25 years   2 children but not with this wife    family history includes Breast cancer in his mother; Ovarian cancer in his sister; Stroke in his unknown relative.  Wt Readings from Last 3 Encounters:  12/05/16 230 lb (104.3 kg)  12/05/16 230 lb (104.3 kg)  09/05/16 239 lb (108.4 kg)    PHYSICAL EXAM BP 124/80   Pulse 69   Ht 5\' 11"  (1.803 m)   Wt 230 lb (104.3 kg)   BMI 32.08 kg/m  Physical Exam  Constitutional: He is oriented to person, place, and time. He appears well-developed and well-nourished. No distress.  Well groomed.   HENT:  Head: Normocephalic and atraumatic.  Mouth/Throat: Oropharynx is clear and moist.  Eyes: EOM are normal.  Neck: Normal range of motion. Normal carotid pulses, no hepatojugular reflux and no JVD present. Carotid bruit is not present.    Cardiovascular: Normal rate, regular rhythm, normal heart sounds and intact distal pulses.  Exam reveals no gallop and no friction rub.   No murmur heard. Pulmonary/Chest: Effort normal and breath sounds normal. No respiratory distress. He has no wheezes. He has no rales.  Abdominal: Soft. Bowel sounds are normal. He exhibits no distension and no mass. There is no tenderness. There is no rebound and no guarding.  Musculoskeletal: Normal range of motion. He exhibits edema (trace bilaeral ankle swelling).  Neurological: He is alert and oriented to person, place, and time. No cranial nerve deficit.  Skin: Skin is warm and dry. No rash noted. No erythema. No pallor.  tan  Psychiatric: He has a normal mood and affect. His behavior is normal. Judgment and thought content normal.  Nursing note and vitals reviewed.    Adult ECG Report  Rate: 69 ;  Rhythm: normal sinus rhythm and ~ inferior MI, age undetermined.  Otherwise normal axis, intervals & durations.;   Narrative Interpretation: stable EKG  Other studies Reviewed: Additional studies/ records that were reviewed today include:  Recent Labs:    ASSESSMENT / PLAN: Problem List Items Addressed This Visit    Atrial fibrillation, persistent ([place]):  CHA2DS2-VASc Score - On Eliquis - Primary (Chronic)    Maintaining sinus rhythm on amiodarone with additional diltiazem for rate control. We stopped the beta blocker because of bradycardia. Bleeding issues on ELIQUIS.  - Need routine amiodarone monitoring - especially in light of his underlying lung disease, we need to monitor for signs of amiodarone lung toxicity. -> If we had to stop amiodarone,probably need to consider either Tikosyn or sotalol or rhythm control. I would ask EP assistance if it came to that.      Relevant Orders   EKG 12-Lead (Completed)   C-reactive protein (Completed)   Sedimentation rate (Completed)   Pulmonary Function Test   Chronic anticoagulation (Chronic)    Doing  well on ELIQUIS      Cigarette smoker two packs a day or less (Chronic)    He is still smoking a bit, and does not seem to be able to fully commit to quitting.      Relevant Orders   Pulmonary Function Test   Essential hypertension (Chronic)  Well-controlled on ACE inhibitor and diltiazem      Relevant Orders   EKG 12-Lead (Completed)   Hyperlipidemia (Chronic)    On atorvastatin low dose. Monitored by PCP.      Relevant Orders   EKG 12-Lead (Completed)   On amiodarone therapy (Chronic)    He has routine labs checked by his PCP including LFTs and thyroid. I'm going to order a sedimentation rate and CRP with plan for follow-up pulmonary function tests this year. - Usually amiodarone toxicity is related to an inflammatory process with elevated sedimentation rate and CRP.      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 (+/- concerns) none The following changes have been made: none  Patient Instructions  SCHEDULE AT [place] physician has recommended that you have a pulmonary function test. Pulmonary Function Tests are a group of tests that measure how well air moves in and out of your lungs.   LABS-TODAY PFT  SED RATE  CRP    Your physician wants you to follow-up in [place]. You will receive a reminder letter in the mail two months in advance. If you don't receive a letter, please call our office to schedule the follow-up appointment.  If you need a refill on your cardiac medications before your next appointment, please call your pharmacy.      Studies Ordered:   Orders Placed This Encounter   Procedures  . C-reactive protein  . Sedimentation rate  . EKG 12-Lead  . Pulmonary Function Test      Glenetta Hew, M.D., M.S. Interventional Cardiologist   Pager # [phone] Phone # [phone] [address]. [place], [place] [address]

## 2016-12-05 NOTE — Patient Instructions (Addendum)
SCHEDULE AT [place]  Your physician has recommended that you have a pulmonary function test. Pulmonary Function Tests are a group of tests that measure how well air moves in and out of your lungs.   LABS-TODAY PFT  SED RATE  CRP    Your physician wants you to follow-up in [place]. You will receive a reminder letter in the mail two months in advance. If you don't receive a letter, please call our office to schedule the follow-up appointment.  If you need a refill on your cardiac medications before your next appointment, please call your pharmacy.

## 2016-12-05 NOTE — Progress Notes (Signed)
Subjective:     Patient ID: Ronald [name], male   DOB: [birth year], 71 y.o.   MRN: [id number]  HPI Patient has multiple medical problems including history of atrial fibrillation, hypertension, obstructive sleep apnea, type 2 diabetes, history of prostate cancer, gout, hyperlipidemia, metabolic syndrome. Seen today to discuss the following issues  Type 2 diabetes. He has not had A1c in 1 year. Does not monitor sugars regularly. No polydipsia. Appetite and weight stable. Remains on metformin.  Atrial fibrillation. He remains on anticoagulation and is also taking amiodarone. Recent lab work including TSH, lipids, and hepatic through cardiology.  He is taking Lasix 1 daily and has not had any recent electrolytes.  Complains of frequent nasal congestion. He's taken nasal antihistamine and oral antihistamines without much improvement. Frequently clears his throat. No active GERD symptoms.  Scaly hyperkeratotic lesions dorsum of both hands. No recent ulceration.  Past Medical History:  Diagnosis Date  . Allergic rhinitis   . Anticoagulant long-term use    eliquis  . Bilateral hydrocele   . COPD with emphysema [facility])    pulmologist-  dr Halford Chessman  . Dyspnea    occasional per pt  . History of colon polyps    tubular adenoma 2013  . History of gout    last episode early June 2017-- (feet)  . Hyperlipidemia   . Hypertension   . Nocturia    flomax has improved symptoms  . OSA on CPAP    per study 08-03-2004  Severe OSA  . Persistent atrial fibrillation [facility])    cardiologist --  paula ross--  post cardioversion 05-07-2014  . Prostate cancer [facility]) urologsit-  dr Alyson Ingles   Dx  2014--  stage T1c, Gleason 3+3=6, PSA 6.67--  Active surveillance  . 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 ([place])    pulmologist-  dr Halford Chessman  . Seasonal allergies   . Sigmoid diverticulosis   . Tinea versicolor   . Type 2 diabetes mellitus ([place])   . Urgency of urination   . Wears glasses     Past Surgical History:  Procedure Laterality Date  . CARDIOVERSION N/A 05/07/2014   Procedure: CARDIOVERSION;  Surgeon: [name], MD;  Location: [facility] ENDOSCOPY;  Service: Cardiovascular;  Laterality: N/A;  . CARDIOVERSION N/A 09/09/2014   Procedure: CARDIOVERSION;  Surgeon: Lelon Perla, MD;  Location: [facility] ENDOSCOPY;  Service: Cardiovascular;  Laterality: N/A;  successfully  . COLONOSCOPY  last one 06-07-2011  . HYDROCELE EXCISION Bilateral 09/26/2015   Procedure: HYDROCELECTOMY ADULT;  Surgeon: [name], MD;  Location: [facility];  Service: Urology;  Laterality: Bilateral;  . LAMINECTOMY AND MICRODISCECTOMY LUMBAR SPINE  12-23-2003   Left L5 -- S1  . LAPAROSCOPIC BILATERAL INGUINAL HERNIA REPAIR/  UMBILICAL HERNIA REPAIR WITH MESH/  ASPIRATION LEFT HYDROCELE  07-11-2012  . NM MYOVIEW LTD  05/18/2014   Low risk study. Normal perfusion: No ischemia or infarction. Mild LV dysfunction - 46% (does not correlate with echocardiographic EF of 50-55%)  . PROSTATE BIOPSY  05/15/12   Clinically both Lobes  . TEE WITHOUT CARDIOVERSION N/A 05/07/2014   Procedure: TRANSESOPHAGEAL ECHOCARDIOGRAM (TEE);  Surgeon: [name], MD;  Location: [facility] ENDOSCOPY;  Service: Cardiovascular;  Laterality: N/A;   mild atherosclerosis plaque of aorta,  mild AR, MR, and TR,  no cardiac source of emboli  . TONSILLECTOMY  as child  . TRANSTHORACIC ECHOCARDIOGRAM  05/01/2014   mild LVH; EF 50-55% (cannot measure diastolic Fxn with Afib), Mod LA Dilation, mild  MR    reports that he has been smoking Cigarettes.  He has a 55.00 pack-year smoking history. He has never used smokeless tobacco. He reports that he drinks about 12.0 oz of alcohol per week . He reports that he does not use drugs. family history includes Breast cancer in his mother; Ovarian cancer in his sister; Stroke in his unknown relative. No Known Allergies   Review of Systems  Constitutional: Negative for fatigue.  HENT: Positive  for congestion and postnasal drip.   Eyes: Negative for visual disturbance.  Respiratory: Negative for cough, chest tightness and shortness of breath.   Cardiovascular: Negative for chest pain, palpitations and leg swelling.  Neurological: Negative for dizziness, syncope, weakness, light-headedness and headaches.       Objective:   Physical Exam  Constitutional: He is oriented to person, place, and time. He appears well-developed and well-nourished.  HENT:  Right Ear: External ear normal.  Left Ear: External ear normal.  Mouth/Throat: Oropharynx is clear and moist.  Eyes: Pupils are equal, round, and reactive to light.  Neck: Neck supple. No thyromegaly present.  Cardiovascular: Normal rate.   Pulmonary/Chest: Effort normal and breath sounds normal. No respiratory distress. He has no wheezes. He has no rales.  Musculoskeletal: He exhibits no edema.  Neurological: He is alert and oriented to person, place, and time.  Skin:  Patient has multiple scattered hyperkeratotic lesions dorsum of both hands consistent with actinic keratoses       Assessment:     #1 type 2 diabetes. A1c 6.7%  #2 hypertension stable at goal  #3 chronic atrial fibrillation on chronic anticoagulation  #4 chronic sinus congestion-suspect perennial allergic rhinitis. May have component of GERD related chronic sinus congestion  #5 multiple actinic keratoses    Plan:     -Schedule follow-up for treatment of actinic keratoses -Check basic metabolic panel -Flu vaccine given -Trial Singulair 10 mg daily at bedtime -May continue with Astelin as needed -Consider elevate head of bed 4-6 inches -Consider trial of acid suppression  [name] MD [facility] [place]

## 2016-12-07 ENCOUNTER — Other Ambulatory Visit: Payer: Self-pay | Admitting: Family Medicine

## 2016-12-08 ENCOUNTER — Encounter: Payer: Self-pay | Admitting: Cardiology

## 2016-12-08 NOTE — Assessment & Plan Note (Addendum)
Maintaining sinus rhythm on amiodarone with additional diltiazem for rate control. We stopped the beta blocker because of bradycardia. Bleeding issues on ELIQUIS.  - Need routine amiodarone monitoring - especially in light of his underlying lung disease, we need to monitor for signs of amiodarone lung toxicity. -> If we had to stop amiodarone,probably need to consider either Tikosyn or sotalol or rhythm control. I would ask EP assistance if it came to that.

## 2016-12-08 NOTE — Assessment & Plan Note (Signed)
He is still smoking a bit, and does not seem to be able to fully commit to quitting.

## 2016-12-08 NOTE — Assessment & Plan Note (Signed)
Doing well on ELIQUIS

## 2016-12-08 NOTE — Assessment & Plan Note (Signed)
He has routine labs checked by his PCP including LFTs and thyroid. I'm going to order a sedimentation rate and CRP with plan for follow-up pulmonary function tests this year. - Usually amiodarone toxicity is related to an inflammatory process with elevated sedimentation rate and CRP.

## 2016-12-08 NOTE — Assessment & Plan Note (Signed)
On atorvastatin low dose. Monitored by PCP.

## 2016-12-08 NOTE — Assessment & Plan Note (Addendum)
Well-controlled on ACE inhibitor and diltiazem

## 2016-12-11 ENCOUNTER — Ambulatory Visit (HOSPITAL_COMMUNITY)
Admission: RE | Admit: 2016-12-11 | Discharge: 2016-12-11 | Disposition: A | Discharge status: Home / Self Care | Payer: Federal, State, Local not specified - PPO | Source: Ambulatory Visit | Attending: Cardiology | Admitting: Cardiology

## 2016-12-11 DIAGNOSIS — F17210 Nicotine dependence, cigarettes, uncomplicated: Secondary | ICD-10-CM | POA: Insufficient documentation

## 2016-12-11 DIAGNOSIS — Z79899 Other long term (current) drug therapy: Secondary | ICD-10-CM

## 2016-12-11 DIAGNOSIS — J84115 Respiratory bronchiolitis interstitial lung disease: Secondary | ICD-10-CM | POA: Diagnosis not present

## 2016-12-11 DIAGNOSIS — I481 Persistent atrial fibrillation: Secondary | ICD-10-CM | POA: Insufficient documentation

## 2016-12-11 DIAGNOSIS — I4819 Other persistent atrial fibrillation: Secondary | ICD-10-CM

## 2016-12-11 LAB — PULMONARY FUNCTION TEST
DL/VA % PRED: 73 %
DL/VA: 3.43 ml/min/mmHg/L
DLCO unc % pred: 63 %
DLCO unc: 21.29 ml/min/mmHg
FEF 25-75 POST: 3.36 L/s
FEF 25-75 Pre: 3.33 L/sec
FEF2575-%CHANGE-POST: 0 %
FEF2575-%PRED-PRE: 133 %
FEF2575-%Pred-Post: 135 %
FEV1-%Change-Post: 0 %
FEV1-%Pred-Post: 96 %
FEV1-%Pred-Pre: 96 %
FEV1-PRE: 3.2 L
FEV1-Post: 3.23 L
FEV1FVC-%Change-Post: -2 %
FEV1FVC-%PRED-PRE: 108 %
FEV6-%Change-Post: 2 %
FEV6-%Pred-Post: 94 %
FEV6-%Pred-Pre: 92 %
FEV6-Post: 4.05 L
FEV6-Pre: 3.96 L
FEV6FVC-%Change-Post: 0 %
FEV6FVC-%Pred-Post: 103 %
FEV6FVC-%Pred-Pre: 104 %
FVC-%CHANGE-POST: 3 %
FVC-%PRED-PRE: 88 %
FVC-%Pred-Post: 91 %
FVC-POST: 4.16 L
FVC-PRE: 4.03 L
POST FEV1/FVC RATIO: 78 %
PRE FEV1/FVC RATIO: 79 %
Post FEV6/FVC ratio: 97 %
Pre FEV6/FVC Ratio: 98 %
RV % pred: 119 %
RV: 3.03 L
TLC % PRED: 102 %
TLC: 7.42 L

## 2016-12-11 MED ORDER — ALBUTEROL SULFATE (2.5 MG/3ML) 0.083% IN NEBU
2.5000 mg | INHALATION_SOLUTION | Freq: Once | RESPIRATORY_TRACT | Status: AC
  Administered 2016-12-11: 2.5 mg via RESPIRATORY_TRACT

## 2016-12-13 ENCOUNTER — Other Ambulatory Visit: Payer: Self-pay | Admitting: Pulmonary Disease

## 2016-12-13 DIAGNOSIS — R351 Nocturia: Secondary | ICD-10-CM | POA: Diagnosis not present

## 2016-12-13 DIAGNOSIS — R972 Elevated prostate specific antigen [PSA]: Secondary | ICD-10-CM | POA: Diagnosis not present

## 2016-12-13 DIAGNOSIS — N401 Enlarged prostate with lower urinary tract symptoms: Secondary | ICD-10-CM | POA: Diagnosis not present

## 2016-12-16 ENCOUNTER — Other Ambulatory Visit: Payer: Self-pay | Admitting: Cardiology

## 2016-12-17 NOTE — Telephone Encounter (Signed)
E-Prescribing Status: Receipt confirmed by pharmacy (12/17/2016 8:16 AM EDT)

## 2016-12-27 ENCOUNTER — Encounter: Payer: Self-pay | Admitting: Family Medicine

## 2017-01-03 ENCOUNTER — Other Ambulatory Visit: Payer: Self-pay | Admitting: Urology

## 2017-01-03 DIAGNOSIS — C61 Malignant neoplasm of prostate: Secondary | ICD-10-CM

## 2017-01-18 ENCOUNTER — Ambulatory Visit
Admission: RE | Admit: 2017-01-18 | Discharge: 2017-01-18 | Disposition: A | Discharge status: Home / Self Care | Payer: Federal, State, Local not specified - PPO | Source: Ambulatory Visit | Attending: Urology | Admitting: Urology

## 2017-01-18 DIAGNOSIS — C61 Malignant neoplasm of prostate: Secondary | ICD-10-CM

## 2017-01-18 DIAGNOSIS — R972 Elevated prostate specific antigen [PSA]: Secondary | ICD-10-CM | POA: Diagnosis not present

## 2017-01-18 MED ORDER — GADOBENATE DIMEGLUMINE 529 MG/ML IV SOLN
20.0000 mL | Freq: Once | INTRAVENOUS | Status: AC | PRN
  Administered 2017-01-18: 20 mL via INTRAVENOUS

## 2017-01-21 ENCOUNTER — Other Ambulatory Visit: Payer: Self-pay | Admitting: Family Medicine

## 2017-01-23 DIAGNOSIS — H02413 Mechanical ptosis of bilateral eyelids: Secondary | ICD-10-CM | POA: Diagnosis not present

## 2017-01-23 DIAGNOSIS — H02422 Myogenic ptosis of left eyelid: Secondary | ICD-10-CM | POA: Diagnosis not present

## 2017-01-28 DIAGNOSIS — H02413 Mechanical ptosis of bilateral eyelids: Secondary | ICD-10-CM | POA: Diagnosis not present

## 2017-01-28 DIAGNOSIS — H02422 Myogenic ptosis of left eyelid: Secondary | ICD-10-CM | POA: Diagnosis not present

## 2017-01-29 ENCOUNTER — Other Ambulatory Visit: Payer: Self-pay | Admitting: *Deleted

## 2017-01-29 ENCOUNTER — Telehealth: Payer: Self-pay | Admitting: Family Medicine

## 2017-01-29 MED ORDER — MONTELUKAST SODIUM 10 MG PO TABS: 10.0000 mg | ORAL_TABLET | Freq: Every day | ORAL | 1 refills | Status: DC

## 2017-01-29 NOTE — Telephone Encounter (Signed)
Pt request refill  indomethacin (INDOCIN) 50 MG capsule  Pt has gout in his big toe and hopes Dr Elease Hashimoto will refill  [id number] - [place], [place] - [address] AT [address]

## 2017-01-29 NOTE — Telephone Encounter (Signed)
I am concerned about combining Indocin with Eliquis- increased risk of bleeding complication.  I would recommend we try Colcrys 0.6 mg two at onset of gout flare and then one bid prn    #30 with 2 refills.   We may end of having to use some prednisone short term.   Set up follow up if would like to discuss options.

## 2017-01-30 MED ORDER — COLCHICINE 0.6 MG PO TABS: ORAL_TABLET | ORAL | 2 refills | Status: DC

## 2017-01-30 NOTE — Telephone Encounter (Signed)
Rx sent and patient is aware. 

## 2017-02-05 DIAGNOSIS — R972 Elevated prostate specific antigen [PSA]: Secondary | ICD-10-CM | POA: Diagnosis not present

## 2017-02-05 DIAGNOSIS — D075 Carcinoma in situ of prostate: Secondary | ICD-10-CM | POA: Diagnosis not present

## 2017-02-10 ENCOUNTER — Other Ambulatory Visit: Payer: Self-pay | Admitting: Family Medicine

## 2017-02-18 ENCOUNTER — Encounter (HOSPITAL_BASED_OUTPATIENT_CLINIC_OR_DEPARTMENT_OTHER): Payer: Self-pay

## 2017-02-18 ENCOUNTER — Emergency Department (HOSPITAL_BASED_OUTPATIENT_CLINIC_OR_DEPARTMENT_OTHER): Payer: Federal, State, Local not specified - PPO

## 2017-02-18 ENCOUNTER — Inpatient Hospital Stay (HOSPITAL_BASED_OUTPATIENT_CLINIC_OR_DEPARTMENT_OTHER)
Admission: EM | Admit: 2017-02-18 | Discharge: 2017-02-25 | DRG: 871 | Disposition: A | Discharge status: Home / Self Care | Payer: Federal, State, Local not specified - PPO | Attending: Internal Medicine | Admitting: Internal Medicine

## 2017-02-18 DIAGNOSIS — K922 Gastrointestinal hemorrhage, unspecified: Secondary | ICD-10-CM | POA: Diagnosis not present

## 2017-02-18 DIAGNOSIS — R0902 Hypoxemia: Secondary | ICD-10-CM | POA: Diagnosis not present

## 2017-02-18 DIAGNOSIS — Z09 Encounter for follow-up examination after completed treatment for conditions other than malignant neoplasm: Secondary | ICD-10-CM

## 2017-02-18 DIAGNOSIS — R509 Fever, unspecified: Secondary | ICD-10-CM | POA: Diagnosis not present

## 2017-02-18 DIAGNOSIS — E1140 Type 2 diabetes mellitus with diabetic neuropathy, unspecified: Secondary | ICD-10-CM | POA: Diagnosis not present

## 2017-02-18 DIAGNOSIS — Z720 Tobacco use: Secondary | ICD-10-CM | POA: Diagnosis not present

## 2017-02-18 DIAGNOSIS — E8770 Fluid overload, unspecified: Secondary | ICD-10-CM | POA: Diagnosis not present

## 2017-02-18 DIAGNOSIS — F17210 Nicotine dependence, cigarettes, uncomplicated: Secondary | ICD-10-CM | POA: Diagnosis not present

## 2017-02-18 DIAGNOSIS — C61 Malignant neoplasm of prostate: Secondary | ICD-10-CM | POA: Diagnosis present

## 2017-02-18 DIAGNOSIS — I5023 Acute on chronic systolic (congestive) heart failure: Secondary | ICD-10-CM | POA: Diagnosis not present

## 2017-02-18 DIAGNOSIS — Z8546 Personal history of malignant neoplasm of prostate: Secondary | ICD-10-CM | POA: Diagnosis not present

## 2017-02-18 DIAGNOSIS — I5021 Acute systolic (congestive) heart failure: Secondary | ICD-10-CM | POA: Diagnosis not present

## 2017-02-18 DIAGNOSIS — E876 Hypokalemia: Secondary | ICD-10-CM | POA: Diagnosis present

## 2017-02-18 DIAGNOSIS — I481 Persistent atrial fibrillation: Secondary | ICD-10-CM | POA: Diagnosis present

## 2017-02-18 DIAGNOSIS — N390 Urinary tract infection, site not specified: Secondary | ICD-10-CM | POA: Diagnosis present

## 2017-02-18 DIAGNOSIS — E7849 Other hyperlipidemia: Secondary | ICD-10-CM | POA: Diagnosis not present

## 2017-02-18 DIAGNOSIS — K921 Melena: Secondary | ICD-10-CM | POA: Diagnosis not present

## 2017-02-18 DIAGNOSIS — R0600 Dyspnea, unspecified: Secondary | ICD-10-CM | POA: Diagnosis not present

## 2017-02-18 DIAGNOSIS — N189 Chronic kidney disease, unspecified: Secondary | ICD-10-CM | POA: Diagnosis present

## 2017-02-18 DIAGNOSIS — E785 Hyperlipidemia, unspecified: Secondary | ICD-10-CM | POA: Diagnosis present

## 2017-02-18 DIAGNOSIS — Z7984 Long term (current) use of oral hypoglycemic drugs: Secondary | ICD-10-CM

## 2017-02-18 DIAGNOSIS — A4151 Sepsis due to Escherichia coli [E. coli]: Secondary | ICD-10-CM | POA: Diagnosis not present

## 2017-02-18 DIAGNOSIS — J439 Emphysema, unspecified: Secondary | ICD-10-CM | POA: Diagnosis present

## 2017-02-18 DIAGNOSIS — E1122 Type 2 diabetes mellitus with diabetic chronic kidney disease: Secondary | ICD-10-CM | POA: Diagnosis not present

## 2017-02-18 DIAGNOSIS — R6521 Severe sepsis with septic shock: Secondary | ICD-10-CM | POA: Diagnosis present

## 2017-02-18 DIAGNOSIS — I5043 Acute on chronic combined systolic (congestive) and diastolic (congestive) heart failure: Secondary | ICD-10-CM

## 2017-02-18 DIAGNOSIS — I517 Cardiomegaly: Secondary | ICD-10-CM | POA: Diagnosis not present

## 2017-02-18 DIAGNOSIS — I480 Paroxysmal atrial fibrillation: Secondary | ICD-10-CM | POA: Diagnosis present

## 2017-02-18 DIAGNOSIS — R6520 Severe sepsis without septic shock: Secondary | ICD-10-CM

## 2017-02-18 DIAGNOSIS — I130 Hypertensive heart and chronic kidney disease with heart failure and stage 1 through stage 4 chronic kidney disease, or unspecified chronic kidney disease: Secondary | ICD-10-CM | POA: Diagnosis not present

## 2017-02-18 DIAGNOSIS — G4733 Obstructive sleep apnea (adult) (pediatric): Secondary | ICD-10-CM | POA: Diagnosis not present

## 2017-02-18 DIAGNOSIS — N179 Acute kidney failure, unspecified: Secondary | ICD-10-CM

## 2017-02-18 DIAGNOSIS — A419 Sepsis, unspecified organism: Secondary | ICD-10-CM | POA: Diagnosis not present

## 2017-02-18 DIAGNOSIS — N410 Acute prostatitis: Secondary | ICD-10-CM

## 2017-02-18 DIAGNOSIS — T502X5A Adverse effect of carbonic-anhydrase inhibitors, benzothiadiazides and other diuretics, initial encounter: Secondary | ICD-10-CM | POA: Diagnosis not present

## 2017-02-18 DIAGNOSIS — N400 Enlarged prostate without lower urinary tract symptoms: Secondary | ICD-10-CM | POA: Diagnosis present

## 2017-02-18 DIAGNOSIS — Z794 Long term (current) use of insulin: Secondary | ICD-10-CM

## 2017-02-18 DIAGNOSIS — I482 Chronic atrial fibrillation: Secondary | ICD-10-CM | POA: Diagnosis present

## 2017-02-18 DIAGNOSIS — Z79899 Other long term (current) drug therapy: Secondary | ICD-10-CM

## 2017-02-18 DIAGNOSIS — Z7901 Long term (current) use of anticoagulants: Secondary | ICD-10-CM

## 2017-02-18 DIAGNOSIS — E1165 Type 2 diabetes mellitus with hyperglycemia: Secondary | ICD-10-CM

## 2017-02-18 DIAGNOSIS — J449 Chronic obstructive pulmonary disease, unspecified: Secondary | ICD-10-CM | POA: Diagnosis present

## 2017-02-18 DIAGNOSIS — R05 Cough: Secondary | ICD-10-CM | POA: Diagnosis not present

## 2017-02-18 DIAGNOSIS — Z8619 Personal history of other infectious and parasitic diseases: Secondary | ICD-10-CM

## 2017-02-18 DIAGNOSIS — I5032 Chronic diastolic (congestive) heart failure: Secondary | ICD-10-CM

## 2017-02-18 HISTORY — DX: Personal history of other infectious and parasitic diseases: Z86.19

## 2017-02-18 LAB — COMPREHENSIVE METABOLIC PANEL
ALBUMIN: 4.2 g/dL (ref 3.5–5.0)
ALT: 43 U/L (ref 17–63)
AST: 31 U/L (ref 15–41)
Alkaline Phosphatase: 102 U/L (ref 38–126)
Anion gap: 11 (ref 5–15)
BUN: 13 mg/dL (ref 6–20)
CHLORIDE: 98 mmol/L — AB (ref 101–111)
CO2: 22 mmol/L (ref 22–32)
Calcium: 8.8 mg/dL — ABNORMAL LOW (ref 8.9–10.3)
Creatinine, Ser: 1 mg/dL (ref 0.61–1.24)
GFR calc Af Amer: >60 mL/min (ref >60)
GFR calc non Af Amer: >60 mL/min (ref >60)
GLUCOSE: 152 mg/dL — AB (ref 65–99)
Potassium: 3.5 mmol/L (ref 3.5–5.1)
SODIUM: 131 mmol/L — AB (ref 135–145)
Total Bilirubin: 0.9 mg/dL (ref 0.3–1.2)
Total Protein: 6.7 g/dL (ref 6.5–8.1)

## 2017-02-18 LAB — CBC WITH DIFFERENTIAL/PLATELET
BASOS ABS: 0 10*3/uL (ref 0.0–0.1)
Basophils Relative: 0 %
Eosinophils Absolute: 0 10*3/uL (ref 0.0–0.7)
Eosinophils Relative: 0 %
HEMATOCRIT: 45.7 % (ref 39.0–52.0)
HEMOGLOBIN: 15.9 g/dL (ref 13.0–17.0)
LYMPHS PCT: 5 %
Lymphs Abs: 0.7 10*3/uL (ref 0.7–4.0)
MCH: 33.5 pg (ref 26.0–34.0)
MCHC: 34.8 g/dL (ref 30.0–36.0)
MCV: 96.2 fL (ref 78.0–100.0)
MONO ABS: 0.8 10*3/uL (ref 0.1–1.0)
MONOS PCT: 6 %
Neutro Abs: 12 10*3/uL — ABNORMAL HIGH (ref 1.7–7.7)
Neutrophils Relative %: 89 %
Platelets: 197 10*3/uL (ref 150–400)
RBC: 4.75 MIL/uL (ref 4.22–5.81)
RDW: 13 % (ref 11.5–15.5)
WBC: 13.5 10*3/uL — ABNORMAL HIGH (ref 4.0–10.5)

## 2017-02-18 LAB — I-STAT CG4 LACTIC ACID, ED: Lactic Acid, Venous: 1.57 mmol/L (ref 0.5–1.9)

## 2017-02-18 MED ORDER — SODIUM CHLORIDE 0.9 % IV BOLUS (SEPSIS)
500.0000 mL | Freq: Once | INTRAVENOUS | Status: AC
  Administered 2017-02-18: 500 mL via INTRAVENOUS

## 2017-02-18 MED ORDER — ACETAMINOPHEN 325 MG PO TABS
650.0000 mg | ORAL_TABLET | Freq: Once | ORAL | Status: AC
Start: 2017-02-18 — End: 2017-02-18
  Administered 2017-02-18: 650 mg via ORAL
  Filled 2017-02-18: qty 2

## 2017-02-18 NOTE — ED Triage Notes (Signed)
C/o fever x today-blood in stools since prostate biopsy on 10/30, diarrhea and urinary retention x today-NAD-steady gait

## 2017-02-18 NOTE — ED Provider Notes (Addendum)
[place] EMERGENCY DEPARTMENT Provider Note   CSN: [id number] Arrival date & time: 02/18/17  2229     History   Chief Complaint Chief Complaint  Patient presents with  . Fever    HPI Ronald [name] is a 71 y.o. male.  HPI  This is a 71 year old male with a history of atrial fibrillation on Eliquis, COPD, hypertension, hyperlipidemia who presents with fever, urinary retention, and blood per rectum.  Patient reports that he had a prostate biopsy almost 2 weeks ago.  He had been doing well.  Over the last 24 hours he has noted several occasions where he has passed a clot.  He also has noted some hematuria.  Today he developed urinary retention and is only "dribbling."  He had chills at home and a fever of 101.9.  He is not on any prophylactic antibiotics.  He denies any other infectious symptoms including cough, upper respiratory symptoms, chest pain, shortness of breath, abdominal pain.  He resumed Eliquis 5 days post biopsy.  Past Medical History:  Diagnosis Date  . Allergic rhinitis   . Anticoagulant long-term use    eliquis  . Bilateral hydrocele   . COPD with emphysema [facility])    pulmologist-  dr Halford Chessman  . Dyspnea    occasional per pt  . History of colon polyps    tubular adenoma 2013  . History of gout    last episode early June 2017-- (feet)  . Hyperlipidemia   . Hypertension   . Nocturia    flomax has improved symptoms  . OSA on CPAP    per study 08-03-2004  Severe OSA  . Persistent atrial fibrillation [facility])    cardiologist --  paula ross--  post cardioversion 05-07-2014  . Prostate cancer [facility]) urologsit-  dr Alyson Ingles   Dx  2014--  stage T1c, Gleason 3+3=6, PSA 6.67--  Active surveillance  . 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 Seasonal allergies   . Sigmoid diverticulosis   . Tinea versicolor   . Type 2 diabetes mellitus ([place])   . Urgency of urination   . Wears glasses     Patient  Active Problem List   Diagnosis Date Noted  . History of colonic polyps 07/03/2016  . Chronic anticoagulation 07/03/2016  . Hyperglycemia, drug-induced 04/05/2015  . 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 On amiodarone therapy 12/08/2014  . Edema of both legs 12/08/2014  . Exertional dyspnea 08/18/2014  . Hypokalemia   . Tobacco abuse   . Atrial fibrillation, persistent ([place]):  CHA2DS2-VASc Score - On Eliquis 05/04/2014  . Cigarette smoker two packs a day or less   . Prostate cancer ([place]) 10/15/2013  . Obesity (BMI 30-39.9) 04/17/2013  . Umbilical hernia 23/55/7322  . Right inguinal hernia 06/23/2012  . Hydrocele 06/19/2012  . Stage T1c Adenocarcinoma of the Prostate with a Gleason's Score of 3+3 and a PSA of 6.63 - Favorable Risk 06/02/2012  . Metabolic syndrome 02/54/2706  . Type 2 diabetes mellitus ([place]) 03/20/2012  . Elevated PSA 03/20/2012  . GOUT, UNSPECIFIED 10/07/2009  . TINEA VERSICOLOR 07/19/2009  . PERS HX TOBACCO USE PRESENTING HAZARDS HEALTH 07/19/2009  . Obstructive sleep apnea 07/02/2008  . Hyperlipidemia 07/01/2008  . Essential hypertension 07/01/2008  . ALLERGIC RHINITIS 07/01/2008    Past Surgical History:  Procedure Laterality Date  . COLONOSCOPY  last one 06-07-2011  . LAMINECTOMY AND MICRODISCECTOMY LUMBAR SPINE  12-23-2003  Left L5 -- S1  . LAPAROSCOPIC BILATERAL INGUINAL HERNIA REPAIR/  UMBILICAL HERNIA REPAIR WITH MESH/  ASPIRATION LEFT HYDROCELE  07-11-2012  . NM MYOVIEW LTD  05/18/2014   Low risk study. Normal perfusion: No ischemia or infarction. Mild LV dysfunction - 46% (does not correlate with echocardiographic EF of 50-55%)  . PROSTATE BIOPSY  05/15/12   Clinically both Lobes  . TONSILLECTOMY  as child  . TRANSTHORACIC ECHOCARDIOGRAM  05/01/2014   mild LVH; EF 50-55% (cannot measure diastolic Fxn with Afib), Mod LA Dilation, mild MR       Home Medications    Prior to Admission medications     Medication Sig Start Date End Date Taking? Authorizing Provider  ACCU-CHEK AVIVA PLUS test strip CHECK BLOOD SUGARS ONCE PER DAY. DX: E11.9 07/05/16   Burchette, Alinda Sierras, MD  acetaminophen (TYLENOL) 500 MG tablet Take 500 mg by mouth every 6 (six) hours as needed for mild pain or moderate pain.    [provider]  albuterol (VENTOLIN HFA) 108 (90 Base) MCG/ACT inhaler Inhale 2 puffs into the lungs every 6 (six) hours as needed for wheezing or shortness of breath. 12/22/15   Chesley Mires, MD  amiodarone (PACERONE) 200 MG tablet TAKE 1 TABLET (200 MG TOTAL) BY MOUTH DAILY. 10/02/16   Leonie Man, MD  apixaban (ELIQUIS) 5 MG TABS tablet Take 5 mg by mouth 2 (two) times daily.    [provider]  atorvastatin (LIPITOR) 10 MG tablet TAKE 1 TABLET (10 MG TOTAL) BY MOUTH EVERY EVENING. 01/21/17   Burchette, Alinda Sierras, MD  azelastine (ASTELIN) 0.1 % nasal spray Place 1 spray into both nostrils 2 (two) times daily. Use in each nostril as directed 12/22/15   Chesley Mires, MD  colchicine 0.6 MG tablet Two tabs at onset of gout flare and then one tab twice daily as needed 01/30/17   [name], MD  diltiazem (CARDIZEM CD) 360 MG 24 hr capsule Take 1 capsule (360 mg total) by mouth daily. 03/14/16   Burchette, Alinda Sierras, MD  ELIQUIS 5 MG TABS tablet TAKE 1 TABLET TWICE A DAY 06/19/16   Leonie Man, MD  ELIQUIS 5 MG TABS tablet TAKE 1 TABLET TWICE A DAY 12/17/16   Leonie Man, MD  fluticasone [facility]) 50 MCG/ACT nasal spray Place 2 sprays into both nostrils daily as needed for allergies or rhinitis. 05/08/14   Hongalgi, Lenis Dickinson, MD  furosemide (LASIX) 40 MG tablet TAKE 1-2 TABLETS DAILY AS NEEDED. 02/11/17   Burchette, Alinda Sierras, MD  Lancets (ACCU-CHEK SOFT TOUCH) lancets Check blood sugars once per day. DX: E11.9 10/24/15   Burchette, Alinda Sierras, MD  lisinopril (PRINIVIL,ZESTRIL) 5 MG tablet TAKE 1 TABLET BY MOUTH DAILY 12/03/16   Leonie Man, MD  metFORMIN (GLUCOPHAGE) 500 MG tablet  TAKE 1 TABLET (500 MG TOTAL) BY MOUTH 2 (TWO) TIMES DAILY WITH A MEAL. 12/07/16   Burchette, Alinda Sierras, MD  montelukast (SINGULAIR) 10 MG tablet Take 1 tablet (10 mg total) by mouth at bedtime. 01/29/17   Burchette, Alinda Sierras, MD  tamsulosin (FLOMAX) 0.4 MG CAPS capsule Take 0.4 mg by mouth daily after supper.  02/14/15   [provider]  triamcinolone cream (KENALOG) 0.1 % APPLY 1 APPLICATION TOPICALLY 2 (TWO) TIMES DAILY AS NEEDED (FOR RASH). 08/15/16   Burchette, Alinda Sierras, MD    Family History Family History  Problem Relation Age of Onset  . Breast cancer Mother   . Stroke Unknown  Unknown   . Ovarian cancer Sister   . Colon cancer Neg Hx   . Esophageal cancer Neg Hx   . Rectal cancer Neg Hx   . Stomach cancer Neg Hx     Social History Social History   Tobacco Use  . Smoking status: Current Every Day Smoker    Packs/day: 1.00    Years: 55.00    Pack years: 55.00    Types: Cigarettes  . Smokeless tobacco: Never Used  . Tobacco comment: trying to quit  mid Jan 2017 - as of 09-20-2015  down to , per pt 2-3 cigarettes per day  Substance Use Topics  . Alcohol use: Yes    Comment: daily  . Drug use: No     Allergies   Patient has no known allergies.   Review of Systems Review of Systems  Constitutional: Positive for fever.  Respiratory: Negative for cough and shortness of breath.   Cardiovascular: Negative for chest pain.  Gastrointestinal: Positive for blood in stool. Negative for abdominal pain, diarrhea, nausea and vomiting.  Genitourinary: Positive for difficulty urinating and hematuria. Negative for dysuria.  Musculoskeletal: Negative for back pain.  All other systems reviewed and are negative.    Physical Exam Updated Vital Signs BP 107/60   Pulse (!) 101   Temp (!) 102.8 F (39.3 C) (Oral)   Resp (!) 27   Ht 5\' 11"  (1.803 m)   Wt 108 kg (238 lb)   SpO2 93%   BMI 33.19 kg/m   Physical Exam  Constitutional: He is oriented to person, place,  and time. He appears well-developed and well-nourished. No distress.  HENT:  Head: Normocephalic and atraumatic.  Cardiovascular: Regular rhythm and normal heart sounds.  No murmur heard. Tachycardia  Pulmonary/Chest: Effort normal. No respiratory distress. He has wheezes.  Abdominal: Soft. Bowel sounds are normal. There is no tenderness. There is no rebound.  Genitourinary:  Genitourinary Comments: No gross blood per rectum  Musculoskeletal: He exhibits no edema.  Trace lower extremity edema  Neurological: He is alert and oriented to person, place, and time.  Skin: Skin is warm and dry.  Psychiatric: He has a normal mood and affect.  Nursing note and vitals reviewed.    ED Treatments / Results  Labs (all labs ordered are listed, but only abnormal results are displayed) Labs Reviewed  COMPREHENSIVE METABOLIC PANEL - Abnormal; Notable for the following components:      Result Value   Sodium 131 (*)    Chloride 98 (*)    Glucose, Bld 152 (*)    Calcium 8.8 (*)    All other components within normal limits  URINALYSIS, ROUTINE W REFLEX MICROSCOPIC - Abnormal; Notable for the following components:   APPearance CLOUDY (*)    Hgb urine dipstick LARGE (*)    Ketones, ur 15 (*)    Leukocytes, UA SMALL (*)    All other components within normal limits  CBC WITH DIFFERENTIAL/PLATELET - Abnormal; Notable for the following components:   WBC 13.5 (*)    Neutro Abs 12.0 (*)    All other components within normal limits  URINALYSIS, MICROSCOPIC (REFLEX) - Abnormal; Notable for the following components:   Bacteria, UA FEW (*)    Squamous Epithelial / LPF 0-5 (*)    All other components within normal limits  CULTURE, BLOOD (ROUTINE X 2)  CULTURE, BLOOD (ROUTINE X 2)  I-STAT CG4 LACTIC ACID, ED  I-STAT CG4 LACTIC ACID, ED    EKG  EKG  Interpretation  Date/Time:  Monday February 18 2017 23:15:29 EST Ventricular Rate:  103 PR Interval:    QRS Duration: 89 QT Interval:  367 QTC  Calculation: 481 R Axis:   138 Text Interpretation:  Sinus tachycardia Right axis deviation Low voltage, extremity leads Consider anterior infarct Baseline wander in lead(s) V1 V5 Confirmed by Thayer Jew [phone]) on 02/18/2017 11:42:08 PM       Radiology Dg Chest 2 View  Result Date: 02/19/2017 CLINICAL DATA:  71 year old male with fever and confusion. EXAM: CHEST  2 VIEW COMPARISON:  Chest radiograph dated 02/18/2017 FINDINGS: There is borderline cardiomegaly with vascular congestion similar to earlier radiograph. No focal consolidation, pleural effusion, or pneumothorax. Left lung base atelectatic changes noted. There is atherosclerotic calcification of the aortic arch. No acute osseous pathology. IMPRESSION: 1. No focal consolidation. 2. Cardiomegaly with mild vascular congestion. No significant interval change compared to the earlier radiograph. Electronically Signed   By: Anner Crete M.D.   On: 02/19/2017 02:46   Dg Chest 2 View  Result Date: 02/18/2017 CLINICAL DATA:  Fever and cough EXAM: CHEST  2 VIEW COMPARISON:  04/12/2015 FINDINGS: Scarring at the bilateral lung bases. No focal consolidation or pleural effusion. Mild cardiomegaly with slight central vascular congestion. Aortic atherosclerosis. No pneumothorax. Degenerative changes of the spine. IMPRESSION: 1. Scarring at the left lung base.  No acute pulmonary infiltrate 2. Borderline to mild cardiomegaly with mild central congestion Electronically Signed   By: Donavan Foil M.D.   On: 02/18/2017 23:32    Procedures Procedures (including critical care time)  CRITICAL CARE Performed by: Merryl Hacker   Total critical care time: 45 minutes  Critical care time was exclusive of separately billable procedures and treating other patients.  Critical care was necessary to treat or prevent imminent or life-threatening deterioration.  Critical care was time spent personally by me on the following activities:  development of treatment plan with patient and/or surrogate as well as nursing, discussions with consultants, evaluation of patient's response to treatment, examination of patient, obtaining history from patient or surrogate, ordering and performing treatments and interventions, ordering and review of laboratory studies, ordering and review of radiographic studies, pulse oximetry and re-evaluation of patient's condition.   Medications Ordered in ED Medications  acetaminophen (TYLENOL) tablet 650 mg (650 mg Oral Given 02/18/17 2254)  sodium chloride 0.9 % bolus 500 mL (0 mLs Intravenous Stopped 02/19/17 0110)  cefTRIAXone (ROCEPHIN) 1 g in dextrose 5 % 50 mL IVPB (0 g Intravenous Stopped 02/19/17 0110)  acetaminophen (TYLENOL) tablet 650 mg (650 mg Oral Given 02/19/17 0144)     Initial Impression / Assessment and Plan / ED Course  I have reviewed the triage vital signs and the nursing notes.  Pertinent labs & imaging results that were available during my care of the patient were reviewed by me and considered in my medical decision making (see chart for details).  Clinical Course as of Feb 20 255  Tue Feb 19, 2017  0148 Spoke with Dr. Dayle Points, urology.  Reviewed result results.  Will cover for urinary tract/prostatitis.  Agrees with Cipro and cultures.  Strict return precautions.  On recheck however, patient is now slightly delirious and now disoriented to time.  Recheck of temperature 101.4.  He was given Tylenol.  He noted to have an increased respiratory rate.  This could be related to fever.  Will repeat chest x-ray to ensure no worsening of vascular congestion.  [CH]    Clinical Course User Index [  CH] Konica Stankowski, Barbette Hair, MD    Patient presents with fever.  Also reports difficulty urinating and new blood clots per rectum.  He is febrile to 103.  He is tachycardic which is likely related to fever.  Physical exam, patient is in no acute distress but does have some wheezing.  No gross blood  per rectum.  Given no other infectious symptoms, suspect urinary tract infection versus prostatitis.  Sepsis workup was initiated.  Normal blood pressure and negative lactate.  Patient was given 500 cc of fluid.  Chest x-ray shows some mild vascular congestion.  No additional fluids ordered.  Bladder scan with <100cc.  Patient has had multiple small voids while in the emergency room.  The patient was given IV Rocephin.  Lab work notable for leukocytosis.  He has blood in 6-30 white cells in the urine with few bacteria.  This is been cultured.  See clinical course above.  Unfortunately, patient now has an oxygen requirement.  He has no significant wheezing on exam.  Repeat chest x-ray does not show any interval worsening of vascular congestion.  He is in no acute distress.  He is persistently febrile despite appropriate dosage of Tylenol.  Most recent temperature 102.8.  Patient was dosed ibuprofen.  Given oxygen requirement and persistent fevers despite appropriate Tylenol, will admit for observation and further management.  3:05 AM Repeat lactate 4.6.  Concern for patient clinically becoming septic.  30 cc/kg has now been ordered.  He has received Rocephin.  We will add Cipro and vancomycin for broader coverage.  Will need to be monitored closely given oxygen requirement as he is at risk for pulmonary edema.  I discussed the patient with Dr. Hal Hope, who is also requesting an influenza screen.  This was added.  Final Clinical Impressions(s) / ED Diagnoses   Final diagnoses:  Fever, unspecified fever cause  Hypoxia  Acute prostatitis    ED Discharge Orders    None       Durant Scibilia, Barbette Hair, MD 02/19/17 0300    Merryl Hacker, MD 02/19/17 1601    Merryl Hacker, MD 02/19/17 [phone]

## 2017-02-19 ENCOUNTER — Encounter (HOSPITAL_BASED_OUTPATIENT_CLINIC_OR_DEPARTMENT_OTHER): Payer: Self-pay | Admitting: Emergency Medicine

## 2017-02-19 ENCOUNTER — Emergency Department (HOSPITAL_BASED_OUTPATIENT_CLINIC_OR_DEPARTMENT_OTHER): Payer: Federal, State, Local not specified - PPO

## 2017-02-19 ENCOUNTER — Other Ambulatory Visit: Payer: Self-pay

## 2017-02-19 ENCOUNTER — Inpatient Hospital Stay (HOSPITAL_COMMUNITY): Payer: Federal, State, Local not specified - PPO

## 2017-02-19 DIAGNOSIS — E1129 Type 2 diabetes mellitus with other diabetic kidney complication: Secondary | ICD-10-CM | POA: Diagnosis not present

## 2017-02-19 DIAGNOSIS — A419 Sepsis, unspecified organism: Secondary | ICD-10-CM

## 2017-02-19 DIAGNOSIS — E1140 Type 2 diabetes mellitus with diabetic neuropathy, unspecified: Secondary | ICD-10-CM | POA: Diagnosis not present

## 2017-02-19 DIAGNOSIS — I5043 Acute on chronic combined systolic (congestive) and diastolic (congestive) heart failure: Secondary | ICD-10-CM | POA: Diagnosis not present

## 2017-02-19 DIAGNOSIS — E785 Hyperlipidemia, unspecified: Secondary | ICD-10-CM | POA: Diagnosis present

## 2017-02-19 DIAGNOSIS — I5021 Acute systolic (congestive) heart failure: Secondary | ICD-10-CM | POA: Diagnosis not present

## 2017-02-19 DIAGNOSIS — R6521 Severe sepsis with septic shock: Secondary | ICD-10-CM | POA: Diagnosis not present

## 2017-02-19 DIAGNOSIS — I482 Chronic atrial fibrillation: Secondary | ICD-10-CM | POA: Diagnosis present

## 2017-02-19 DIAGNOSIS — I481 Persistent atrial fibrillation: Secondary | ICD-10-CM | POA: Diagnosis not present

## 2017-02-19 DIAGNOSIS — A4151 Sepsis due to Escherichia coli [E. coli]: Secondary | ICD-10-CM | POA: Diagnosis not present

## 2017-02-19 DIAGNOSIS — R0989 Other specified symptoms and signs involving the circulatory and respiratory systems: Secondary | ICD-10-CM | POA: Diagnosis not present

## 2017-02-19 DIAGNOSIS — Z79899 Other long term (current) drug therapy: Secondary | ICD-10-CM | POA: Diagnosis not present

## 2017-02-19 DIAGNOSIS — C61 Malignant neoplasm of prostate: Secondary | ICD-10-CM | POA: Diagnosis not present

## 2017-02-19 DIAGNOSIS — R6520 Severe sepsis without septic shock: Secondary | ICD-10-CM | POA: Diagnosis not present

## 2017-02-19 DIAGNOSIS — J439 Emphysema, unspecified: Secondary | ICD-10-CM | POA: Diagnosis not present

## 2017-02-19 DIAGNOSIS — E8770 Fluid overload, unspecified: Secondary | ICD-10-CM | POA: Diagnosis not present

## 2017-02-19 DIAGNOSIS — N179 Acute kidney failure, unspecified: Secondary | ICD-10-CM | POA: Diagnosis not present

## 2017-02-19 DIAGNOSIS — G4733 Obstructive sleep apnea (adult) (pediatric): Secondary | ICD-10-CM | POA: Diagnosis not present

## 2017-02-19 DIAGNOSIS — I130 Hypertensive heart and chronic kidney disease with heart failure and stage 1 through stage 4 chronic kidney disease, or unspecified chronic kidney disease: Secondary | ICD-10-CM | POA: Diagnosis not present

## 2017-02-19 DIAGNOSIS — Z720 Tobacco use: Secondary | ICD-10-CM | POA: Diagnosis not present

## 2017-02-19 DIAGNOSIS — K7689 Other specified diseases of liver: Secondary | ICD-10-CM | POA: Diagnosis not present

## 2017-02-19 DIAGNOSIS — I5023 Acute on chronic systolic (congestive) heart failure: Secondary | ICD-10-CM | POA: Diagnosis not present

## 2017-02-19 DIAGNOSIS — I480 Paroxysmal atrial fibrillation: Secondary | ICD-10-CM | POA: Diagnosis present

## 2017-02-19 DIAGNOSIS — N390 Urinary tract infection, site not specified: Secondary | ICD-10-CM | POA: Diagnosis not present

## 2017-02-19 DIAGNOSIS — I517 Cardiomegaly: Secondary | ICD-10-CM | POA: Diagnosis not present

## 2017-02-19 DIAGNOSIS — E876 Hypokalemia: Secondary | ICD-10-CM | POA: Diagnosis not present

## 2017-02-19 DIAGNOSIS — N189 Chronic kidney disease, unspecified: Secondary | ICD-10-CM | POA: Diagnosis not present

## 2017-02-19 DIAGNOSIS — F17210 Nicotine dependence, cigarettes, uncomplicated: Secondary | ICD-10-CM | POA: Diagnosis present

## 2017-02-19 DIAGNOSIS — Z7984 Long term (current) use of oral hypoglycemic drugs: Secondary | ICD-10-CM | POA: Diagnosis not present

## 2017-02-19 DIAGNOSIS — K921 Melena: Secondary | ICD-10-CM | POA: Diagnosis not present

## 2017-02-19 DIAGNOSIS — N410 Acute prostatitis: Secondary | ICD-10-CM | POA: Diagnosis not present

## 2017-02-19 DIAGNOSIS — R0600 Dyspnea, unspecified: Secondary | ICD-10-CM | POA: Diagnosis not present

## 2017-02-19 DIAGNOSIS — K922 Gastrointestinal hemorrhage, unspecified: Secondary | ICD-10-CM | POA: Diagnosis not present

## 2017-02-19 DIAGNOSIS — J449 Chronic obstructive pulmonary disease, unspecified: Secondary | ICD-10-CM | POA: Diagnosis present

## 2017-02-19 DIAGNOSIS — Z8546 Personal history of malignant neoplasm of prostate: Secondary | ICD-10-CM | POA: Diagnosis not present

## 2017-02-19 DIAGNOSIS — R0902 Hypoxemia: Secondary | ICD-10-CM | POA: Diagnosis not present

## 2017-02-19 DIAGNOSIS — R0602 Shortness of breath: Secondary | ICD-10-CM | POA: Diagnosis not present

## 2017-02-19 DIAGNOSIS — Z7901 Long term (current) use of anticoagulants: Secondary | ICD-10-CM | POA: Diagnosis not present

## 2017-02-19 DIAGNOSIS — E1122 Type 2 diabetes mellitus with diabetic chronic kidney disease: Secondary | ICD-10-CM | POA: Diagnosis present

## 2017-02-19 DIAGNOSIS — E7849 Other hyperlipidemia: Secondary | ICD-10-CM | POA: Diagnosis not present

## 2017-02-19 LAB — CBC WITH DIFFERENTIAL/PLATELET
Basophils Absolute: 0 10*3/uL (ref 0.0–0.1)
Basophils Relative: 0 %
Eosinophils Absolute: 0 10*3/uL (ref 0.0–0.7)
Eosinophils Relative: 0 %
HEMATOCRIT: 40.2 % (ref 39.0–52.0)
HEMOGLOBIN: 13.9 g/dL (ref 13.0–17.0)
LYMPHS ABS: 0.4 10*3/uL — AB (ref 0.7–4.0)
Lymphocytes Relative: 4 %
MCH: 33.5 pg (ref 26.0–34.0)
MCHC: 34.6 g/dL (ref 30.0–36.0)
MCV: 96.9 fL (ref 78.0–100.0)
MONOS PCT: 2 %
Monocytes Absolute: 0.2 10*3/uL (ref 0.1–1.0)
NEUTROS ABS: 10 10*3/uL — AB (ref 1.7–7.7)
NEUTROS PCT: 94 %
Platelets: 144 10*3/uL — ABNORMAL LOW (ref 150–400)
RBC: 4.15 MIL/uL — ABNORMAL LOW (ref 4.22–5.81)
RDW: 13.2 % (ref 11.5–15.5)
WBC: 10.6 10*3/uL — AB (ref 4.0–10.5)

## 2017-02-19 LAB — PROTIME-INR
INR: 1.3
Prothrombin Time: 16.1 seconds — ABNORMAL HIGH (ref 11.4–15.2)

## 2017-02-19 LAB — HEMOGLOBIN AND HEMATOCRIT, BLOOD
HCT: 39.4 % (ref 39.0–52.0)
HEMATOCRIT: 39.3 % (ref 39.0–52.0)
Hemoglobin: 13.5 g/dL (ref 13.0–17.0)
Hemoglobin: 13.4 g/dL (ref 13.0–17.0)

## 2017-02-19 LAB — COMPREHENSIVE METABOLIC PANEL
ALK PHOS: 86 U/L (ref 38–126)
ALT: 37 U/L (ref 17–63)
ANION GAP: 10 (ref 5–15)
AST: 35 U/L (ref 15–41)
Albumin: 2.8 g/dL — ABNORMAL LOW (ref 3.5–5.0)
BILIRUBIN TOTAL: 1.2 mg/dL (ref 0.3–1.2)
BUN: 13 mg/dL (ref 6–20)
CALCIUM: 7.4 mg/dL — AB (ref 8.9–10.3)
CO2: 22 mmol/L (ref 22–32)
Chloride: 101 mmol/L (ref 101–111)
Creatinine, Ser: 1.31 mg/dL — ABNORMAL HIGH (ref 0.61–1.24)
GFR, EST NON AFRICAN AMERICAN: 53 mL/min — AB (ref >60)
GLUCOSE: 159 mg/dL — AB (ref 65–99)
Potassium: 2.8 mmol/L — ABNORMAL LOW (ref 3.5–5.1)
Sodium: 133 mmol/L — ABNORMAL LOW (ref 135–145)
TOTAL PROTEIN: 5 g/dL — AB (ref 6.5–8.1)

## 2017-02-19 LAB — GLUCOSE, CAPILLARY
Glucose-Capillary: 180 mg/dL — ABNORMAL HIGH (ref 65–99)
Glucose-Capillary: 178 mg/dL — ABNORMAL HIGH (ref 65–99)
Glucose-Capillary: 120 mg/dL — ABNORMAL HIGH (ref 65–99)
Glucose-Capillary: 128 mg/dL — ABNORMAL HIGH (ref 65–99)

## 2017-02-19 LAB — URINALYSIS, ROUTINE W REFLEX MICROSCOPIC
BILIRUBIN URINE: NEGATIVE
Glucose, UA: NEGATIVE mg/dL
KETONES UR: 15 mg/dL — AB
NITRITE: NEGATIVE
Protein, ur: NEGATIVE mg/dL
SPECIFIC GRAVITY, URINE: 1.02 (ref 1.005–1.030)
pH: 5.5 (ref 5.0–8.0)

## 2017-02-19 LAB — I-STAT CG4 LACTIC ACID, ED: LACTIC ACID, VENOUS: 4.62 mmol/L — AB (ref 0.5–1.9)

## 2017-02-19 LAB — URINALYSIS, MICROSCOPIC (REFLEX)

## 2017-02-19 LAB — TROPONIN I

## 2017-02-19 LAB — ABO/RH: ABO/RH(D): A POS

## 2017-02-19 LAB — TSH: TSH: 1.65 u[IU]/mL (ref 0.350–4.500)

## 2017-02-19 LAB — INFLUENZA PANEL BY PCR (TYPE A & B)
Influenza A By PCR: NEGATIVE
Influenza B By PCR: NEGATIVE

## 2017-02-19 LAB — MRSA PCR SCREENING: MRSA BY PCR: NEGATIVE

## 2017-02-19 LAB — TYPE AND SCREEN
ABO/RH(D): A POS
Antibody Screen: NEGATIVE

## 2017-02-19 LAB — APTT: aPTT: 28 seconds (ref 24–36)

## 2017-02-19 LAB — PROCALCITONIN: PROCALCITONIN: 14.6 ng/mL

## 2017-02-19 LAB — LACTIC ACID, PLASMA
LACTIC ACID, VENOUS: 2.2 mmol/L — AB (ref 0.5–1.9)
Lactic Acid, Venous: 2.5 mmol/L (ref 0.5–1.9)

## 2017-02-19 MED ORDER — AZELASTINE HCL 0.1 % NA SOLN
1.0000 | Freq: Two times a day (BID) | NASAL | Status: DC
  Administered 2017-02-20 – 2017-02-25 (×10): 1 via NASAL
  Filled 2017-02-19: qty 30

## 2017-02-19 MED ORDER — LISINOPRIL 2.5 MG PO TABS: 5.0000 mg | ORAL_TABLET | Freq: Every day | ORAL | Status: DC

## 2017-02-19 MED ORDER — VANCOMYCIN HCL IN DEXTROSE 1-5 GM/200ML-% IV SOLN
1000.0000 mg | Freq: Once | INTRAVENOUS | Status: AC
  Administered 2017-02-19: 1000 mg via INTRAVENOUS
  Filled 2017-02-19: qty 200

## 2017-02-19 MED ORDER — VANCOMYCIN HCL 10 G IV SOLR
1500.0000 mg | INTRAVENOUS | Status: DC
  Filled 2017-02-19: qty 1500

## 2017-02-19 MED ORDER — DEXTROSE 5 % IV SOLN
1.0000 g | Freq: Once | INTRAVENOUS | Status: AC
  Administered 2017-02-19: 1 g via INTRAVENOUS
  Filled 2017-02-19: qty 10

## 2017-02-19 MED ORDER — ONDANSETRON HCL 4 MG/2ML IJ SOLN: 4.0000 mg | Freq: Four times a day (QID) | INTRAMUSCULAR | Status: DC | PRN

## 2017-02-19 MED ORDER — ACETAMINOPHEN 325 MG PO TABS
650.0000 mg | ORAL_TABLET | Freq: Four times a day (QID) | ORAL | Status: DC | PRN
  Administered 2017-02-21: 650 mg via ORAL
  Filled 2017-02-19: qty 2

## 2017-02-19 MED ORDER — DILTIAZEM HCL ER COATED BEADS 180 MG PO CP24: 360.0000 mg | ORAL_CAPSULE | Freq: Every day | ORAL | Status: DC

## 2017-02-19 MED ORDER — HEPARIN SODIUM (PORCINE) 5000 UNIT/ML IJ SOLN
5000.0000 [IU] | Freq: Three times a day (TID) | INTRAMUSCULAR | Status: DC
  Administered 2017-02-19 – 2017-02-21 (×6): 5000 [IU] via SUBCUTANEOUS
  Filled 2017-02-19 (×6): qty 1

## 2017-02-19 MED ORDER — SODIUM CHLORIDE 0.9 % IV BOLUS (SEPSIS)
500.0000 mL | Freq: Once | INTRAVENOUS | Status: AC
  Administered 2017-02-19: 500 mL via INTRAVENOUS

## 2017-02-19 MED ORDER — SODIUM CHLORIDE 0.9 % IV BOLUS (SEPSIS)
1000.0000 mL | Freq: Once | INTRAVENOUS | Status: AC
  Administered 2017-02-19: 1000 mL via INTRAVENOUS

## 2017-02-19 MED ORDER — INSULIN ASPART 100 UNIT/ML ~~LOC~~ SOLN
0.0000 [IU] | Freq: Three times a day (TID) | SUBCUTANEOUS | Status: DC
  Administered 2017-02-19 (×2): 2 [IU] via SUBCUTANEOUS
  Administered 2017-02-20: 3 [IU] via SUBCUTANEOUS
  Administered 2017-02-20 – 2017-02-22 (×3): 1 [IU] via SUBCUTANEOUS
  Administered 2017-02-22: 2 [IU] via SUBCUTANEOUS
  Administered 2017-02-23 – 2017-02-24 (×4): 1 [IU] via SUBCUTANEOUS
  Administered 2017-02-25: 2 [IU] via SUBCUTANEOUS
  Administered 2017-02-25: 1 [IU] via SUBCUTANEOUS

## 2017-02-19 MED ORDER — MAGNESIUM SULFATE IN D5W 1-5 GM/100ML-% IV SOLN
1.0000 g | Freq: Once | INTRAVENOUS | Status: AC
  Administered 2017-02-19: 1 g via INTRAVENOUS
  Filled 2017-02-19: qty 100

## 2017-02-19 MED ORDER — IBUPROFEN 400 MG PO TABS
600.0000 mg | ORAL_TABLET | Freq: Once | ORAL | Status: AC
  Administered 2017-02-19: 600 mg via ORAL
  Filled 2017-02-19: qty 1

## 2017-02-19 MED ORDER — ALPRAZOLAM 0.25 MG PO TABS
0.2500 mg | ORAL_TABLET | Freq: Once | ORAL | Status: AC
  Administered 2017-02-19: 0.25 mg via ORAL
  Filled 2017-02-19: qty 1

## 2017-02-19 MED ORDER — VANCOMYCIN HCL 10 G IV SOLR
1250.0000 mg | INTRAVENOUS | Status: DC
  Administered 2017-02-19: 1250 mg via INTRAVENOUS
  Filled 2017-02-19: qty 1250

## 2017-02-19 MED ORDER — ACETAMINOPHEN 325 MG PO TABS
650.0000 mg | ORAL_TABLET | Freq: Once | ORAL | Status: AC
  Administered 2017-02-19: 650 mg via ORAL
  Filled 2017-02-19: qty 2

## 2017-02-19 MED ORDER — IOPAMIDOL (ISOVUE-300) INJECTION 61%
INTRAVENOUS | Status: AC
  Administered 2017-02-19: 15 mL
  Filled 2017-02-19: qty 30

## 2017-02-19 MED ORDER — POTASSIUM CHLORIDE IN NACL 40-0.9 MEQ/L-% IV SOLN
INTRAVENOUS | Status: DC
  Administered 2017-02-19: 125 mL/h via INTRAVENOUS
  Administered 2017-02-19 – 2017-02-20 (×2): 100 mL/h via INTRAVENOUS
  Filled 2017-02-19 (×4): qty 1000

## 2017-02-19 MED ORDER — AMIODARONE HCL 200 MG PO TABS
200.0000 mg | ORAL_TABLET | Freq: Every day | ORAL | Status: DC
  Administered 2017-02-19 – 2017-02-25 (×7): 200 mg via ORAL
  Filled 2017-02-19 (×7): qty 1

## 2017-02-19 MED ORDER — IOPAMIDOL (ISOVUE-300) INJECTION 61%: 15.0000 mL | Freq: Once | INTRAVENOUS | Status: DC | PRN

## 2017-02-19 MED ORDER — POTASSIUM CHLORIDE CRYS ER 20 MEQ PO TBCR
40.0000 meq | EXTENDED_RELEASE_TABLET | Freq: Four times a day (QID) | ORAL | Status: AC
  Administered 2017-02-19 (×2): 40 meq via ORAL
  Filled 2017-02-19 (×2): qty 2

## 2017-02-19 MED ORDER — ATORVASTATIN CALCIUM 10 MG PO TABS
10.0000 mg | ORAL_TABLET | Freq: Every evening | ORAL | Status: DC
  Administered 2017-02-19 – 2017-02-24 (×6): 10 mg via ORAL
  Filled 2017-02-19 (×6): qty 1

## 2017-02-19 MED ORDER — CIPROFLOXACIN IN D5W 400 MG/200ML IV SOLN
400.0000 mg | Freq: Once | INTRAVENOUS | Status: AC
  Administered 2017-02-19: 400 mg via INTRAVENOUS
  Filled 2017-02-19: qty 200

## 2017-02-19 MED ORDER — ACETAMINOPHEN 650 MG RE SUPP: 650.0000 mg | Freq: Four times a day (QID) | RECTAL | Status: DC | PRN

## 2017-02-19 MED ORDER — SODIUM CHLORIDE 0.9 % IV BOLUS (SEPSIS): 500.0000 mL | Freq: Once | INTRAVENOUS | Status: DC

## 2017-02-19 MED ORDER — SODIUM CHLORIDE 0.9 % IV SOLN: INTRAVENOUS | Status: DC

## 2017-02-19 MED ORDER — PIPERACILLIN-TAZOBACTAM 3.375 G IVPB
3.3750 g | Freq: Three times a day (TID) | INTRAVENOUS | Status: DC
  Administered 2017-02-19 – 2017-02-22 (×9): 3.375 g via INTRAVENOUS
  Filled 2017-02-19 (×8): qty 50

## 2017-02-19 MED ORDER — PIPERACILLIN-TAZOBACTAM 3.375 G IVPB 30 MIN
3.3750 g | Freq: Once | INTRAVENOUS | Status: AC
  Administered 2017-02-19: 3.375 g via INTRAVENOUS
  Filled 2017-02-19: qty 50

## 2017-02-19 MED ORDER — ONDANSETRON HCL 4 MG PO TABS: 4.0000 mg | ORAL_TABLET | Freq: Four times a day (QID) | ORAL | Status: DC | PRN

## 2017-02-19 MED ORDER — TAMSULOSIN HCL 0.4 MG PO CAPS: 0.4000 mg | ORAL_CAPSULE | Freq: Every day | ORAL | Status: DC

## 2017-02-19 MED ORDER — FLUTICASONE PROPIONATE 50 MCG/ACT NA SUSP
2.0000 | Freq: Every day | NASAL | Status: DC | PRN
  Administered 2017-02-20 – 2017-02-21 (×3): 2 via NASAL
  Filled 2017-02-19: qty 16

## 2017-02-19 NOTE — Progress Notes (Signed)
Pharmacy Antibiotic Note  Ronald [name] is a 71 y.o. male admitted on 02/18/2017 with sepsis- UTI vs prostatitis as most likely source.  PMH pertinent for prostate CA s/p biopsy a few weeks ago.  He presents today with hematuria, urinary retention & fever.  Pharmacy has been consulted for Vancomycin & Zosyn dosing.   Plan:  Continue Zosyn 3.375gm IV Q8h to be infused over 4hrs  SCr rose from 1 to 1.31 - will change vancomycin from 1500mg  IV q24h to 1250mg  IV q24 Target AUC 400-500 - new est AUC will 433 using new SCr  Monitor renal function and cx data   Height: 5\' 11"  (180.3 cm) Weight: 238 lb (108 kg) IBW/kg (Calculated) : 75.3  Temp (24hrs), Avg:101.2 F (38.4 C), Min:97.7 F (36.5 C), Max:103 F (39.4 C)  Recent Labs  Lab 02/18/17 2251 02/18/17 2324 02/19/17 0309 02/19/17 0623 02/19/17 0815  WBC 13.5*  --   --  10.6*  --   CREATININE 1.00  --   --  1.31*  --   LATICACIDVEN  --  1.57 4.62* 2.5* 2.2*    Estimated Creatinine Clearance: 64.7 mL/min (A) (by C-G formula based on SCr of 1.31 mg/dL (H)).    No Known Allergies  Antimicrobials this admission: 11/13 Zosyn >>  11/13 Vanc >>  11/13 Cipro/Rocephin x1   Dose adjustments this admission:  Microbiology results: 11/12 BCx: sent 11/13 MRSA PCR: neg   Thank you for allowing pharmacy to be a part of this patient's care.   Adrian Saran, PharmD, BCPS Pager [phone] 02/19/2017 9:52 AM

## 2017-02-19 NOTE — Consult Note (Signed)
Urology Consult  Referring physician: Dr. Candiss Norse Reason for referral: sepsis after prostate biopsy  Chief Complaint: suprapubic pain  History of Present Illness: [name] is a 71yo with a hx of prostate cancer and BP{H who presented to the ER with a 1 day history of suprapubic pain and confusion. The pain is sharp, constant, severe, nonradiating suprapubic pain. He had an episode of hematuria 2 days ago. He underwent prostate biopsy 3 weeks ago and was doing well until yesterday. His hematuria has resolved. A foley was placed due to retention. UA micro shows bacteria. He has associated confusion and fatigue. SBP in the 80-90s. He was admitted to the ICU with sepsis. CT scan shows a thick walled bladder and enlarged prostate  Past Medical History:  Diagnosis Date  . Allergic rhinitis   . Anticoagulant long-term use    eliquis  . Bilateral hydrocele   . COPD with emphysema [facility])    pulmologist-  dr Halford Chessman  . Dyspnea    occasional per pt  . History of colon polyps    tubular adenoma 2013  . History of gout    last episode early June 2017-- (feet)  . Hyperlipidemia   . Hypertension   . Nocturia    flomax has improved symptoms  . OSA on CPAP    per study 08-03-2004  Severe OSA  . Persistent atrial fibrillation [facility])    cardiologist --  paula ross--  post cardioversion 05-07-2014  . Prostate cancer [facility]) urologsit-  dr Alyson Ingles   Dx  2014--  stage T1c, Gleason 3+3=6, PSA 6.67--  Active surveillance  . 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 Seasonal allergies   . Sigmoid diverticulosis   . Tinea versicolor   . Type 2 diabetes mellitus ([place])   . Urgency of urination   . Wears glasses    Past Surgical History:  Procedure Laterality Date  . COLONOSCOPY  last one 06-07-2011  . LAMINECTOMY AND MICRODISCECTOMY LUMBAR SPINE  12-23-2003   Left L5 -- S1  . LAPAROSCOPIC BILATERAL INGUINAL HERNIA REPAIR/  UMBILICAL HERNIA REPAIR  WITH MESH/  ASPIRATION LEFT HYDROCELE  07-11-2012  . NM MYOVIEW LTD  05/18/2014   Low risk study. Normal perfusion: No ischemia or infarction. Mild LV dysfunction - 46% (does not correlate with echocardiographic EF of 50-55%)  . PROSTATE BIOPSY  05/15/12   Clinically both [place]  . TONSILLECTOMY  as child  . TRANSTHORACIC ECHOCARDIOGRAM  05/01/2014   mild LVH; EF 50-55% (cannot measure diastolic Fxn with Afib), Mod LA Dilation, mild MR    Medications: I have reviewed the patient's current medications. Allergies: No Known Allergies  Family History  Problem Relation Age of Onset  . Breast cancer Mother   . Stroke Unknown        Unknown   . Ovarian cancer Sister   . Colon cancer Neg Hx   . Esophageal cancer Neg Hx   . Rectal cancer Neg Hx   . Stomach cancer Neg Hx    Social History:  reports that he has been smoking cigarettes.  He has a 55.00 pack-year smoking history. he has never used smokeless tobacco. He reports that he drinks alcohol. He reports that he does not use drugs.  Review of Systems  Constitutional: Positive for chills, fever and malaise/fatigue.  Gastrointestinal: Positive for abdominal pain and nausea.  Genitourinary: Positive for dysuria, frequency, hematuria and urgency.  All other systems reviewed and are negative.  Physical Exam:  Vital signs in last 24 hours: Temp:  [97.7 F (36.5 C)-103 F (39.4 C)] 98.5 F (36.9 C) (11/13 1600) Pulse Rate:  [60-112] 63 (11/13 1600) Resp:  [15-34] 25 (11/13 1600) BP: (83-153)/(58-92) 111/77 (11/13 1600) SpO2:  [87 %-95 %] 93 % (11/13 1600) Weight:  [108 kg (238 lb)] 108 kg (238 lb) (11/12 2242) Physical Exam  Constitutional: He is oriented to person, place, and time. He appears well-developed and well-nourished.  HENT:  Head: Normocephalic and atraumatic.  Eyes: EOM are normal. Pupils are equal, round, and reactive to light.  Neck: Normal range of motion. No thyromegaly present.  Cardiovascular: Normal rate and  regular rhythm.  Respiratory: Effort normal. No respiratory distress.  GI: Soft. He exhibits no distension.  Musculoskeletal: Normal range of motion. He exhibits no edema.  Neurological: He is alert and oriented to person, place, and time.  Skin: Skin is warm and dry.  Psychiatric: He has a normal mood and affect. His behavior is normal. Judgment and thought content normal.    Laboratory Data:  Results for orders placed or performed during the hospital encounter of 02/18/17 (from the past 72 hour(s))  Comprehensive metabolic panel     Status: Abnormal   Collection Time: 02/18/17 10:51 PM  Result Value Ref Range   Sodium 131 (L) 135 - 145 mmol/L   Potassium 3.5 3.5 - 5.1 mmol/L   Chloride 98 (L) 101 - 111 mmol/L   CO2 22 22 - 32 mmol/L   Glucose, Bld 152 (H) 65 - 99 mg/dL   BUN 13 6 - 20 mg/dL   Creatinine, Ser 1.00 0.61 - 1.24 mg/dL   Calcium 8.8 (L) 8.9 - 10.3 mg/dL   Total Protein 6.7 6.5 - 8.1 g/dL   Albumin 4.2 3.5 - 5.0 g/dL   AST 31 15 - 41 U/L   ALT 43 17 - 63 U/L   Alkaline Phosphatase 102 38 - 126 U/L   Total Bilirubin 0.9 0.3 - 1.2 mg/dL   GFR calc non Af Amer >60 >60 mL/min   GFR calc Af Amer >60 >60 mL/min    Comment: (NOTE) The eGFR has been calculated using the CKD EPI equation. This calculation has not been validated in all clinical situations. eGFR's persistently <60 mL/min signify possible Chronic Kidney Disease.    Anion gap 11 5 - 15  CBC with Differential     Status: Abnormal   Collection Time: 02/18/17 10:51 PM  Result Value Ref Range   WBC 13.5 (H) 4.0 - 10.5 K/uL   RBC 4.75 4.22 - 5.81 MIL/uL   Hemoglobin 15.9 13.0 - 17.0 g/dL   HCT 45.7 39.0 - 52.0 %   MCV 96.2 78.0 - 100.0 fL   MCH 33.5 26.0 - 34.0 pg   MCHC 34.8 30.0 - 36.0 g/dL   RDW 13.0 11.5 - 15.5 %   Platelets 197 150 - 400 K/uL   Neutrophils Relative % 89 %   Neutro Abs 12.0 (H) 1.7 - 7.7 K/uL   Lymphocytes Relative 5 %   Lymphs Abs 0.7 0.7 - 4.0 K/uL   Monocytes Relative 6 %    Monocytes Absolute 0.8 0.1 - 1.0 K/uL   Eosinophils Relative 0 %   Eosinophils Absolute 0.0 0.0 - 0.7 K/uL   Basophils Relative 0 %   Basophils Absolute 0.0 0.0 - 0.1 K/uL  I-Stat CG4 Lactic Acid, ED     Status: None   Collection Time: 02/18/17 11:24 PM  Result  Value Ref Range   Lactic Acid, Venous 1.57 0.5 - 1.9 mmol/L  Urinalysis, Routine w reflex microscopic     Status: Abnormal   Collection Time: 02/19/17 12:01 AM  Result Value Ref Range   Color, Urine YELLOW YELLOW   APPearance CLOUDY (A) CLEAR   Specific Gravity, Urine 1.020 1.005 - 1.030   pH 5.5 5.0 - 8.0   Glucose, UA NEGATIVE NEGATIVE mg/dL   Hgb urine dipstick LARGE (A) NEGATIVE   Bilirubin Urine NEGATIVE NEGATIVE   Ketones, ur 15 (A) NEGATIVE mg/dL   Protein, ur NEGATIVE NEGATIVE mg/dL   Nitrite NEGATIVE NEGATIVE   Leukocytes, UA SMALL (A) NEGATIVE  Urinalysis, Microscopic (reflex)     Status: Abnormal   Collection Time: 02/19/17 12:01 AM  Result Value Ref Range   RBC / HPF 6-30 0 - 5 RBC/hpf   WBC, UA 6-30 0 - 5 WBC/hpf   Bacteria, UA FEW (A) NONE SEEN   Squamous Epithelial / LPF 0-5 (A) NONE SEEN  I-Stat CG4 Lactic Acid, ED     Status: Abnormal   Collection Time: 02/19/17  3:09 AM  Result Value Ref Range   Lactic Acid, Venous 4.62 (HH) 0.5 - 1.9 mmol/L   Comment NOTIFIED PHYSICIAN   Influenza panel by PCR (type A & B)     Status: None   Collection Time: 02/19/17  4:00 AM  Result Value Ref Range   Influenza A By PCR NEGATIVE NEGATIVE   Influenza B By PCR NEGATIVE NEGATIVE    Comment: (NOTE) The Xpert Xpress Flu assay is intended as an aid in the diagnosis of  influenza and should not be used as a sole basis for treatment.  This  assay is FDA approved for nasopharyngeal swab specimens only. Nasal  washings and aspirates are unacceptable for Xpert Xpress Flu testing. Performed at [facility], [place] [address] [place], [place] [address]   MRSA PCR Screening     Status: None   Collection Time: 02/19/17   5:25 AM  Result Value Ref Range   MRSA by PCR NEGATIVE NEGATIVE    Comment:        The GeneXpert MRSA Assay (FDA approved for NASAL specimens only), is one component of a comprehensive MRSA colonization surveillance program. It is not intended to diagnose MRSA infection nor to guide or monitor treatment for MRSA infections.   ABO/Rh     Status: None   Collection Time: 02/19/17  6:06 AM  Result Value Ref Range   ABO/RH(D) A POS   CBC with Differential     Status: Abnormal   Collection Time: 02/19/17  6:23 AM  Result Value Ref Range   WBC 10.6 (H) 4.0 - 10.5 K/uL   RBC 4.15 (L) 4.22 - 5.81 MIL/uL   Hemoglobin 13.9 13.0 - 17.0 g/dL   HCT 40.2 39.0 - 52.0 %   MCV 96.9 78.0 - 100.0 fL   MCH 33.5 26.0 - 34.0 pg   MCHC 34.6 30.0 - 36.0 g/dL   RDW 13.2 11.5 - 15.5 %   Platelets 144 (L) 150 - 400 K/uL   Neutrophils Relative % 94 %   Neutro Abs 10.0 (H) 1.7 - 7.7 K/uL   Lymphocytes Relative 4 %   Lymphs Abs 0.4 (L) 0.7 - 4.0 K/uL   Monocytes Relative 2 %   Monocytes Absolute 0.2 0.1 - 1.0 K/uL   Eosinophils Relative 0 %   Eosinophils Absolute 0.0 0.0 - 0.7 K/uL   Basophils Relative  0 %   Basophils Absolute 0.0 0.0 - 0.1 K/uL  Comprehensive metabolic panel     Status: Abnormal   Collection Time: 02/19/17  6:23 AM  Result Value Ref Range   Sodium 133 (L) 135 - 145 mmol/L   Potassium 2.8 (L) 3.5 - 5.1 mmol/L   Chloride 101 101 - 111 mmol/L   CO2 22 22 - 32 mmol/L   Glucose, Bld 159 (H) 65 - 99 mg/dL   BUN 13 6 - 20 mg/dL   Creatinine, Ser 1.31 (H) 0.61 - 1.24 mg/dL   Calcium 7.4 (L) 8.9 - 10.3 mg/dL   Total Protein 5.0 (L) 6.5 - 8.1 g/dL   Albumin 2.8 (L) 3.5 - 5.0 g/dL   AST 35 15 - 41 U/L   ALT 37 17 - 63 U/L   Alkaline Phosphatase 86 38 - 126 U/L   Total Bilirubin 1.2 0.3 - 1.2 mg/dL   GFR calc non Af Amer 53 (L) >60 mL/min   GFR calc Af Amer >60 >60 mL/min    Comment: (NOTE) The eGFR has been calculated using the CKD EPI equation. This calculation has not been  validated in all clinical situations. eGFR's persistently <60 mL/min signify possible Chronic Kidney Disease.    Anion gap 10 5 - 15  Lactic acid, plasma     Status: Abnormal   Collection Time: 02/19/17  6:23 AM  Result Value Ref Range   Lactic Acid, Venous 2.5 (HH) 0.5 - 1.9 mmol/L    Comment: CRITICAL RESULT CALLED TO, READ BACK BY AND VERIFIED WITH: OLEARY,T. RN AT 0715 02/19/17 MULLINS,T   Procalcitonin     Status: None   Collection Time: 02/19/17  6:23 AM  Result Value Ref Range   Procalcitonin 14.60 ng/mL    Comment:        Interpretation: PCT >= 10 ng/mL: Important systemic inflammatory response, almost exclusively due to severe bacterial sepsis or septic shock. (NOTE)         ICU PCT Algorithm               Non ICU PCT Algorithm    ----------------------------     ------------------------------         PCT < 0.25 ng/mL                 PCT < 0.1 ng/mL     Stopping of antibiotics            Stopping of antibiotics       strongly encouraged.               strongly encouraged.    ----------------------------     ------------------------------       PCT level decrease by               PCT < 0.25 ng/mL       >= 80% from peak PCT       OR PCT 0.25 - 0.5 ng/mL          Stopping of antibiotics                                             encouraged.     Stopping of antibiotics           encouraged.    ----------------------------     ------------------------------       PCT  level decrease by              PCT >= 0.25 ng/mL       < 80% from peak PCT        AND PCT >= 0.5 ng/mL             Continuing antibiotics                                              encouraged.       Continuing antibiotics            encouraged.    ----------------------------     ------------------------------     PCT level increase compared          PCT > 0.5 ng/mL         with peak PCT AND          PCT >= 0.5 ng/mL             Escalation of antibiotics                                           strongly encouraged.      Escalation of antibiotics        strongly encouraged.   Protime-INR     Status: Abnormal   Collection Time: 02/19/17  6:23 AM  Result Value Ref Range   Prothrombin Time 16.1 (H) 11.4 - 15.2 seconds   INR 1.30   APTT     Status: None   Collection Time: 02/19/17  6:23 AM  Result Value Ref Range   aPTT 28 24 - 36 seconds  Type and screen [place]     Status: None   Collection Time: 02/19/17  6:23 AM  Result Value Ref Range   ABO/RH(D) A POS    Antibody Screen NEG    Sample Expiration 02/22/2017   Troponin I     Status: None   Collection Time: 02/19/17  6:23 AM  Result Value Ref Range   Troponin I <0.03 <0.03 ng/mL  TSH     Status: None   Collection Time: 02/19/17  6:23 AM  Result Value Ref Range   TSH 1.650 0.350 - 4.500 uIU/mL    Comment: Performed by a 3rd Generation assay with a functional sensitivity of <=0.01 uIU/mL.  Glucose, capillary     Status: Abnormal   Collection Time: 02/19/17  8:12 AM  Result Value Ref Range   Glucose-Capillary 180 (H) 65 - 99 mg/dL   Comment 1 Notify RN    Comment 2 Document in Chart   Lactic acid, plasma     Status: Abnormal   Collection Time: 02/19/17  8:15 AM  Result Value Ref Range   Lactic Acid, Venous 2.2 (HH) 0.5 - 1.9 mmol/L    Comment: CRITICAL RESULT CALLED TO, READ BACK BY AND VERIFIED WITH: [email or web address] RN [phone] [id number] [email or web address]   Glucose, capillary     Status: Abnormal   Collection Time: 02/19/17 11:42 AM  Result Value Ref Range   Glucose-Capillary 178 (H) 65 - 99 mg/dL   Comment 1 Notify RN    Comment 2 Document in Chart   Hemoglobin and hematocrit, blood     Status: None   Collection Time:  02/19/17  1:49 PM  Result Value Ref Range   Hemoglobin 13.5 13.0 - 17.0 g/dL   HCT 39.3 39.0 - 52.0 %  Glucose, capillary     Status: Abnormal   Collection Time: 02/19/17  4:19 PM  Result Value Ref Range   Glucose-Capillary 120 (H) 65 - 99 mg/dL   Comment 1 Notify RN    Comment 2 Document in  Chart    Recent Results (from the past 240 hour(s))  MRSA PCR Screening     Status: None   Collection Time: 02/19/17  5:25 AM  Result Value Ref Range Status   MRSA by PCR NEGATIVE NEGATIVE Final    Comment:        The GeneXpert MRSA Assay (FDA approved for NASAL specimens only), is one component of a comprehensive MRSA colonization surveillance program. It is not intended to diagnose MRSA infection nor to guide or monitor treatment for MRSA infections.    Creatinine: Recent Labs    02/18/17 2251 02/19/17 0623  CREATININE 1.00 1.31*   Baseline Creatinine: 1  Impression/Assessment:  71yo with UTI and sepsis from a urinary source  Plan:  1. Continue management per the ICU team 2. Continue foley catheter to straight drin 3. Continue broad spectrum antibiotics pending urine and blood cultures.  Nicolette Bang 02/19/2017, 4:52 PM

## 2017-02-19 NOTE — Progress Notes (Signed)
@IPLOG @        PROGRESS NOTE                                                                                                                                                                                                             Patient Demographics:    Ronald [name], is a 71 y.o. male, DOB - [birth year], IPJ:[id number]  Admit date - 02/18/2017   Admitting Physician Rise Patience, MD  Outpatient Primary MD for the patient is [name], MD  LOS - 0  Chief Complaint  Patient presents with  . Fever       Brief Narrative  Ronald [name] is a 71 y.o. male with history of atrial fibrillation, hypertension, sleep apnea, prostate cancer presents to the ER with complaints of fever and chills and has passed blood in the stools.  Patient had a prostate biopsy 10 days ago by [name].  Was doing fine since then.  Yesterday afternoon around 2 PM patient had loose bowel stools which was bloody.  Also passed some clots.  Later in the evening around 3 PM he got stuck in the rain while driving.  Again he had a bowel movement later around 5 PM which also was bloody.  Patient started developing fever and chills.  Denies any chest pain productive cough nausea vomiting abdominal pain.  Patient started having persistent fever chills and came to the ER.  Is admitted with sepsis likely due to UTI with elevated pro-calcitonin, H&H is stable, he does have history of hemorrhoids as well.    Subjective:    Ronald [name] today has, No headache, No chest pain, No abdominal pain - No Nausea, No new weakness tingling or numbness, No Cough - SOB.    Assessment  & Plan :    Principal Problem:   Sepsis ([place]) Active Problems:   Obstructive sleep apnea   Type 2 diabetes mellitus (HCC)   Stage T1c Adenocarcinoma of the Prostate with a Gleason's Score of 3+3 and a PSA of 6.63 - Favorable Risk   Atrial fibrillation, persistent ([place]):  CHA2DS2-VASc Score - On Eliquis   Tobacco abuse   1.   Sepsis.  Most likely due to UTI.  Did have prostate biopsy about 1 month ago.  Pro-calcitonin extremely high and likely will be bacteremic, for now continue, IV fluids bolus along with maintenance, follow cultures, clinically appears nontoxic.  Urology already requested to evaluate the patient.  Check CT abdomen pelvis.  2.  2 episodes of some blood  mixed with stool.  Does have history of hemorrhoids, was on Eliquis, H&H stable, this was likely mild hemorrhoidal bleed, CT abdomen pelvis to rule out diverticulitis, for now hold Eliquis for a full 24 hours, monitor H&H and any further episodes of GI bleed.  If persistent with drop in H&H will involve GI as well.  3.  Paroxysmal atrial fibrillation.  Chads VASC 2 score of at least 3.  Currently in sinus tachycardia, continue amiodarone, Eliquis on hold due to #2 above, patient understands risks and benefits of holding Eliquis which were explained to him in detail by me in the room, monitor in stepdown on telemetry.  4.  Obstructive sleep apnea.  CPAP added nightly.  5.  DM type II.  On Modified diet along with sliding scale.  6.  Essential hypertension.  Currently blood pressure is low.  Hydrate and hold hypertensive medications.    Diet : Diet heart healthy/carb modified Room service appropriate? Yes; Fluid consistency: Thin    Family Communication  :  None  Code Status :  Full  Disposition Plan  :  Step down  Consults  :  Urology  Procedures  :     DVT Prophylaxis  :  Heparin    Lab Results  Component Value Date   PLT 144 (L) 02/19/2017    Inpatient Medications  Scheduled Meds: . amiodarone  200 mg Oral Daily  . atorvastatin  10 mg Oral QPM  . insulin aspart  0-9 Units Subcutaneous TID WC  . potassium chloride  40 mEq Oral Q6H   Continuous Infusions: . 0.9 % NaCl with KCl 40 mEq / L 125 mL/hr (02/19/17 0830)  . magnesium sulfate 1 - 4 g bolus IVPB    . piperacillin-tazobactam (ZOSYN)  IV    . vancomycin     PRN  Meds:.acetaminophen **OR** acetaminophen, fluticasone, ondansetron **OR** ondansetron (ZOFRAN) IV  Antibiotics  :    Anti-infectives (From admission, onward)   Start     Dose/Rate Route Frequency Ordered Stop   02/19/17 1400  piperacillin-tazobactam (ZOSYN) IVPB 3.375 g     3.375 g 12.5 mL/hr over 240 Minutes Intravenous Every 8 hours 02/19/17 0556     02/19/17 1000  vancomycin (VANCOCIN) 1,500 mg in sodium chloride 0.9 % 500 mL IVPB     1,500 mg 250 mL/hr over 120 Minutes Intravenous Every 24 hours 02/19/17 0556     02/19/17 0600  piperacillin-tazobactam (ZOSYN) IVPB 3.375 g     3.375 g 100 mL/hr over 30 Minutes Intravenous  Once 02/19/17 0537 02/19/17 0638   02/19/17 0330  vancomycin (VANCOCIN) IVPB 1000 mg/200 mL premix     1,000 mg 200 mL/hr over 60 Minutes Intravenous  Once 02/19/17 0317 02/19/17 0433   02/19/17 0315  ciprofloxacin (CIPRO) IVPB 400 mg     400 mg 200 mL/hr over 60 Minutes Intravenous  Once 02/19/17 0300 02/19/17 0404   02/19/17 0015  cefTRIAXone (ROCEPHIN) 1 g in dextrose 5 % 50 mL IVPB     1 g 100 mL/hr over 30 Minutes Intravenous  Once 02/19/17 0010 02/19/17 0110         Objective:   Vitals:   02/19/17 0359 02/19/17 0400 02/19/17 0600 02/19/17 0800  BP:  114/78 94/66 (!) 83/64  Pulse:  93 88 81  Resp:  (!) 25 (!) 22 15  Temp: (!) 100.9 F (38.3 C)   97.7 F (36.5 C)  TempSrc: Oral   Oral  SpO2:  90% 92% 92%  Weight:      Height:        Wt Readings from Last 3 Encounters:  02/18/17 108 kg (238 lb)  12/05/16 104.3 kg (230 lb)  12/05/16 104.3 kg (230 lb)     Intake/Output Summary (Last 24 hours) at 02/19/2017 0921 Last data filed at 02/19/2017 0830 Gross per 24 hour  Intake 1870 ml  Output -  Net 1870 ml     Physical Exam  Awake Alert, Oriented X 3, No new F.N deficits, Normal affect [place].AT,PERRAL Supple Neck,No JVD, No cervical lymphadenopathy appriciated.  Symmetrical Chest wall movement, Good air movement bilaterally, CTAB RRR,No  Gallops,Rubs or new Murmurs, No Parasternal Heave +ve B.Sounds, Abd Soft, No tenderness, No organomegaly appriciated, No rebound - guarding or rigidity. No Cyanosis, Clubbing or edema, No new Rash or bruise       Data Review:    CBC Recent Labs  Lab 02/18/17 2251 02/19/17 0623  WBC 13.5* 10.6*  HGB 15.9 13.9  HCT 45.7 40.2  PLT 197 144*  MCV 96.2 96.9  MCH 33.5 33.5  MCHC 34.8 34.6  RDW 13.0 13.2  LYMPHSABS 0.7 0.4*  MONOABS 0.8 0.2  EOSABS 0.0 0.0  BASOSABS 0.0 0.0    Chemistries  Recent Labs  Lab 02/18/17 2251 02/19/17 0623  NA 131* 133*  K 3.5 2.8*  CL 98* 101  CO2 22 22  GLUCOSE 152* 159*  BUN 13 13  CREATININE 1.00 1.31*  CALCIUM 8.8* 7.4*  AST 31 35  ALT 43 37  ALKPHOS 102 86  BILITOT 0.9 1.2   ------------------------------------------------------------------------------------------------------------------ No results for input(s): CHOL, HDL, LDLCALC, TRIG, CHOLHDL, LDLDIRECT in the last 72 hours.  Lab Results  Component Value Date   HGBA1C 6.7 12/05/2016   ------------------------------------------------------------------------------------------------------------------ Recent Labs    02/19/17 0623  TSH 1.650   ------------------------------------------------------------------------------------------------------------------ No results for input(s): VITAMINB12, FOLATE, FERRITIN, TIBC, IRON, RETICCTPCT in the last 72 hours.  Coagulation profile Recent Labs  Lab 02/19/17 0623  INR 1.30    No results for input(s): DDIMER in the last 72 hours.  Cardiac Enzymes Recent Labs  Lab 02/19/17 0962  TROPONINI <0.03   ------------------------------------------------------------------------------------------------------------------    Component Value Date/Time   BNP 83.0 05/04/2014 1452    Micro Results Recent Results (from the past 240 hour(s))  MRSA PCR Screening     Status: None   Collection Time: 02/19/17  5:25 AM  Result Value Ref  Range Status   MRSA by PCR NEGATIVE NEGATIVE Final    Comment:        The GeneXpert MRSA Assay (FDA approved for NASAL specimens only), is one component of a comprehensive MRSA colonization surveillance program. It is not intended to diagnose MRSA infection nor to guide or monitor treatment for MRSA infections.     Radiology Reports Dg Chest 2 View  Result Date: 02/19/2017 CLINICAL DATA:  71 year old male with fever and confusion. EXAM: CHEST  2 VIEW COMPARISON:  Chest radiograph dated 02/18/2017 FINDINGS: There is borderline cardiomegaly with vascular congestion similar to earlier radiograph. No focal consolidation, pleural effusion, or pneumothorax. Left lung base atelectatic changes noted. There is atherosclerotic calcification of the aortic arch. No acute osseous pathology. IMPRESSION: 1. No focal consolidation. 2. Cardiomegaly with mild vascular congestion. No significant interval change compared to the earlier radiograph. Electronically Signed   By: Anner Crete M.D.   On: 02/19/2017 02:46   Dg Chest 2 View  Result Date: 02/18/2017 CLINICAL DATA:  Fever and cough EXAM: CHEST  2 VIEW COMPARISON:  04/12/2015 FINDINGS: Scarring at the bilateral lung bases. No focal consolidation or pleural effusion. Mild cardiomegaly with slight central vascular congestion. Aortic atherosclerosis. No pneumothorax. Degenerative changes of the spine. IMPRESSION: 1. Scarring at the left lung base.  No acute pulmonary infiltrate 2. Borderline to mild cardiomegaly with mild central congestion Electronically Signed   By: Donavan Foil M.D.   On: 02/18/2017 23:32    Time Spent in minutes  30   Lala Lund M.D on 02/19/2017 at 9:21 AM  Between 7am to 7pm - Pager - [phone] ( page via [email or web address], text pages only, please mention full 10 digit call back number). After 7pm go to [email or web address] - password [email or web address]

## 2017-02-19 NOTE — Care Management Note (Signed)
Case Management Note  Patient Details  Name: Ronald [name] MRN: [id number] Date of Birth: [birth year]  Subjective/Objective:                  Fever and poss sepsis  Action/Plan: Date: February 19, 2017 Velva Harman, BSN, [place], [place] Chart and notes review for patient progress and needs. Will follow for case management and discharge needs. Next review date: [id number]  Expected Discharge Date:                  Expected Discharge Plan:  Home/Self Care  In-House Referral:     Discharge planning Services  CM Consult  Post Acute Care Choice:    Choice offered to:     DME Arranged:    DME Agency:     HH Arranged:    HH Agency:     Status of Service:  In process, will continue to follow  If discussed at Long Length of Stay Meetings, dates discussed:    Additional Comments:  Leeroy Cha, RN 02/19/2017, 9:10 AM

## 2017-02-19 NOTE — Progress Notes (Signed)
Pharmacy Antibiotic Note  Ronald [name] is a 71 y.o. male admitted on 02/18/2017 with sepsis- UTI vs prostatitis as most likely source.  PMH pertinent for prostate CA s/p biopsy a few weeks ago.  He presents today with hematuria, urinary retention & fever.  Pharmacy has been consulted for Vancomycin & Zosyn dosing. 02/19/2017:  Tm 103F  WBC slightly elevated, LA elevated & trending up  Renal function at patient's baseline.  NCrCl ~ 97ml/min  Plan:  Zosyn 3.375gm IV Q8h to be infused over 4hrs  Vancomycin 1500mg  IV q24h- start this morning for 2500mg  total IV loading dose.  Target AUC 400-500.  Monitor renal function and cx data   Height: 5\' 11"  (180.3 cm) Weight: 238 lb (108 kg) IBW/kg (Calculated) : 75.3  Temp (24hrs), Avg:101.8 F (38.8 C), Min:100.4 F (38 C), Max:103 F (39.4 C)  Recent Labs  Lab 02/18/17 2251 02/18/17 2324 02/19/17 0309  WBC 13.5*  --   --   CREATININE 1.00  --   --   LATICACIDVEN  --  1.57 4.62*    Estimated Creatinine Clearance: 84.7 mL/min (by C-G formula based on SCr of 1 mg/dL).    No Known Allergies  Antimicrobials this admission: 11/13 Zosyn >>  11/13 Vanc >>  11/13 Cipro/Rocephin x1   Dose adjustments this admission:  Microbiology results: 11/12 BCx:  11/13 MRSA PCR:   Thank you for allowing pharmacy to be a part of this patient's care.  Biagio Borg 02/19/2017 5:41 AM

## 2017-02-19 NOTE — H&P (Addendum)
History and Physical    Ronald [name] TDD:[id number] DOB: Dec 05, 1945 DOA: 02/18/2017  PCP: [name], MD  Patient coming from: Home.  Chief Complaint: Fever and chills.  HPI: Ronald [name] is a 71 y.o. male with history of atrial fibrillation, hypertension, sleep apnea, prostate cancer presents to the ER with complaints of fever and chills and has passed blood in the stools.  Patient had a prostate biopsy 10 days ago by [name].  Was doing fine since then.  Yesterday afternoon around 2 PM patient had loose bowel stools which was bloody.  Also passed some clots.  Later in the evening around 3 PM he got stuck in the rain while driving.  Again he had a bowel movement later around 5 PM which also was bloody.  Patient started developing fever and chills.  Denies any chest pain productive cough nausea vomiting abdominal pain.  Patient started having persistent fever chills and came to the ER.  ED Course: In the ER patient is found to have a fever of 103 F and initial lactate was normal but subsequent one was done again because patient had spiked fever again.  Repeat lactate was 4.6.  Patient was kept on sepsis protocol with blood cultures urine cultures influenza PCR started on empiric antibiotics.  Patient became mildly hypoxic in the ER.  Chest x-ray does not show any infiltrates.  Patient is being admitted for sepsis likely source could be urinary tract given the recent prostate biopsy.  Patient also had diarrhea which could also be the source.  Review of Systems: As per HPI, rest all negative.   Past Medical History:  Diagnosis Date  . Allergic rhinitis   . Anticoagulant long-term use    eliquis  . Bilateral hydrocele   . COPD with emphysema [facility])    pulmologist-  dr Halford Chessman  . Dyspnea    occasional per pt  . History of colon polyps    tubular adenoma 2013  . History of gout    last episode early June 2017-- (feet)  . Hyperlipidemia   . Hypertension   . Nocturia    flomax has improved symptoms  . OSA on CPAP    per study 08-03-2004  Severe OSA  . Persistent atrial fibrillation [facility])    cardiologist --  paula ross--  post cardioversion 05-07-2014  . Prostate cancer [facility]) urologsit-  dr Alyson Ingles   Dx  2014--  stage T1c, Gleason 3+3=6, PSA 6.67--  Active surveillance  . 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 Seasonal allergies   . Sigmoid diverticulosis   . Tinea versicolor   . Type 2 diabetes mellitus ([place])   . Urgency of urination   . Wears glasses     Past Surgical History:  Procedure Laterality Date  . COLONOSCOPY  last one 06-07-2011  . LAMINECTOMY AND MICRODISCECTOMY LUMBAR SPINE  12-23-2003   Left L5 -- S1  . LAPAROSCOPIC BILATERAL INGUINAL HERNIA REPAIR/  UMBILICAL HERNIA REPAIR WITH MESH/  ASPIRATION LEFT HYDROCELE  07-11-2012  . NM MYOVIEW LTD  05/18/2014   Low risk study. Normal perfusion: No ischemia or infarction. Mild LV dysfunction - 46% (does not correlate with echocardiographic EF of 50-55%)  . PROSTATE BIOPSY  05/15/12   Clinically both [place]  . TONSILLECTOMY  as child  . TRANSTHORACIC ECHOCARDIOGRAM  05/01/2014   mild LVH; EF 50-55% (cannot measure diastolic Fxn with Afib), Mod LA Dilation, mild MR  reports that he has been smoking cigarettes.  He has a 55.00 pack-year smoking history. he has never used smokeless tobacco. He reports that he drinks alcohol. He reports that he does not use drugs.  No Known Allergies  Family History  Problem Relation Age of Onset  . Breast cancer Mother   . Stroke Unknown        Unknown   . Ovarian cancer Sister   . Colon cancer Neg Hx   . Esophageal cancer Neg Hx   . Rectal cancer Neg Hx   . Stomach cancer Neg Hx     Prior to Admission medications   Medication Sig Start Date End Date Taking? Authorizing Provider  ACCU-CHEK AVIVA PLUS test strip CHECK BLOOD SUGARS ONCE PER DAY. DX: E11.9 07/05/16   Burchette, Alinda Sierras, MD    acetaminophen (TYLENOL) 500 MG tablet Take 500 mg by mouth every 6 (six) hours as needed for mild pain or moderate pain.    [provider]  albuterol (VENTOLIN HFA) 108 (90 Base) MCG/ACT inhaler Inhale 2 puffs into the lungs every 6 (six) hours as needed for wheezing or shortness of breath. 12/22/15   Chesley Mires, MD  amiodarone (PACERONE) 200 MG tablet TAKE 1 TABLET (200 MG TOTAL) BY MOUTH DAILY. 10/02/16   Leonie Man, MD  apixaban (ELIQUIS) 5 MG TABS tablet Take 5 mg by mouth 2 (two) times daily.    [provider]  atorvastatin (LIPITOR) 10 MG tablet TAKE 1 TABLET (10 MG TOTAL) BY MOUTH EVERY EVENING. 01/21/17   Burchette, Alinda Sierras, MD  azelastine (ASTELIN) 0.1 % nasal spray Place 1 spray into both nostrils 2 (two) times daily. Use in each nostril as directed 12/22/15   Chesley Mires, MD  colchicine 0.6 MG tablet Two tabs at onset of gout flare and then one tab twice daily as needed 01/30/17   [name], MD  diltiazem (CARDIZEM CD) 360 MG 24 hr capsule Take 1 capsule (360 mg total) by mouth daily. 03/14/16   Burchette, Alinda Sierras, MD  ELIQUIS 5 MG TABS tablet TAKE 1 TABLET TWICE A DAY 06/19/16   Leonie Man, MD  ELIQUIS 5 MG TABS tablet TAKE 1 TABLET TWICE A DAY 12/17/16   Leonie Man, MD  fluticasone [facility]) 50 MCG/ACT nasal spray Place 2 sprays into both nostrils daily as needed for allergies or rhinitis. 05/08/14   Hongalgi, Lenis Dickinson, MD  furosemide (LASIX) 40 MG tablet TAKE 1-2 TABLETS DAILY AS NEEDED. 02/11/17   Burchette, Alinda Sierras, MD  Lancets (ACCU-CHEK SOFT TOUCH) lancets Check blood sugars once per day. DX: E11.9 10/24/15   Burchette, Alinda Sierras, MD  lisinopril (PRINIVIL,ZESTRIL) 5 MG tablet TAKE 1 TABLET BY MOUTH DAILY 12/03/16   Leonie Man, MD  metFORMIN (GLUCOPHAGE) 500 MG tablet TAKE 1 TABLET (500 MG TOTAL) BY MOUTH 2 (TWO) TIMES DAILY WITH A MEAL. 12/07/16   Burchette, Alinda Sierras, MD  montelukast (SINGULAIR) 10 MG tablet Take 1 tablet (10 mg total) by  mouth at bedtime. 01/29/17   Burchette, Alinda Sierras, MD  tamsulosin (FLOMAX) 0.4 MG CAPS capsule Take 0.4 mg by mouth daily after supper.  02/14/15   [provider]  triamcinolone cream (KENALOG) 0.1 % APPLY 1 APPLICATION TOPICALLY 2 (TWO) TIMES DAILY AS NEEDED (FOR RASH). 08/15/16   [name], MD    Physical Exam: Vitals:   02/19/17 0300 02/19/17 0330 02/19/17 0359 02/19/17 0400  BP: 106/70 113/68  114/78  Pulse: Marland Kitchen)  104 (!) 101  93  Resp: (!) 34 (!) 28  (!) 25  Temp:   (!) 100.9 F (38.3 C)   TempSrc:   Oral   SpO2: (!) 87% (!) 88%  90%  Weight:      Height:          Constitutional: Moderately built and nourished. Vitals:   02/19/17 0300 02/19/17 0330 02/19/17 0359 02/19/17 0400  BP: 106/70 113/68  114/78  Pulse: (!) 104 (!) 101  93  Resp: (!) 34 (!) 28  (!) 25  Temp:   (!) 100.9 F (38.3 C)   TempSrc:   Oral   SpO2: (!) 87% (!) 88%  90%  Weight:      Height:       Eyes: Anicteric no pallor. ENMT: No discharge from the ears eyes nose or mouth. Neck: No mass felt.  No neck rigidity. Respiratory: No rhonchi or crepitations. Cardiovascular: S1-S2 heard no murmurs appreciated. Abdomen: Soft nontender bowel sounds present. Musculoskeletal: No edema.  No joint effusion. Skin: No rash.  Skin appears warm. Neurologic: Alert awake oriented to time place and person.  Moves all extremities. Psychiatric: Appears normal.  Normal affect.   Labs on Admission: I have personally reviewed following labs and imaging studies  CBC: Recent Labs  Lab 02/18/17 2251  WBC 13.5*  NEUTROABS 12.0*  HGB 15.9  HCT 45.7  MCV 96.2  PLT 846   Basic Metabolic Panel: Recent Labs  Lab 02/18/17 2251  NA 131*  K 3.5  CL 98*  CO2 22  GLUCOSE 152*  BUN 13  CREATININE 1.00  CALCIUM 8.8*   GFR: Estimated Creatinine Clearance: 84.7 mL/min (by C-G formula based on SCr of 1 mg/dL). Liver Function Tests: Recent Labs  Lab 02/18/17 2251  AST 31  ALT 43  ALKPHOS 102    BILITOT 0.9  PROT 6.7  ALBUMIN 4.2   No results for input(s): LIPASE, AMYLASE in the last 168 hours. No results for input(s): AMMONIA in the last 168 hours. Coagulation Profile: No results for input(s): INR, PROTIME in the last 168 hours. Cardiac Enzymes: No results for input(s): CKTOTAL, CKMB, CKMBINDEX, TROPONINI in the last 168 hours. BNP (last 3 results) No results for input(s): PROBNP in the last 8760 hours. HbA1C: No results for input(s): HGBA1C in the last 72 hours. CBG: No results for input(s): GLUCAP in the last 168 hours. Lipid Profile: No results for input(s): CHOL, HDL, LDLCALC, TRIG, CHOLHDL, LDLDIRECT in the last 72 hours. Thyroid Function Tests: No results for input(s): TSH, T4TOTAL, FREET4, T3FREE, THYROIDAB in the last 72 hours. Anemia Panel: No results for input(s): VITAMINB12, FOLATE, FERRITIN, TIBC, IRON, RETICCTPCT in the last 72 hours. Urine analysis:    Component Value Date/Time   COLORURINE YELLOW 02/19/2017 0001   APPEARANCEUR CLOUDY (A) 02/19/2017 0001   LABSPEC 1.020 02/19/2017 0001   PHURINE 5.5 02/19/2017 0001   GLUCOSEU NEGATIVE 02/19/2017 0001   HGBUR LARGE (A) 02/19/2017 0001   BILIRUBINUR NEGATIVE 02/19/2017 0001   BILIRUBINUR n 03/10/2012 0858   KETONESUR 15 (A) 02/19/2017 0001   PROTEINUR NEGATIVE 02/19/2017 0001   UROBILINOGEN 1.0 05/04/2014 1123   NITRITE NEGATIVE 02/19/2017 0001   LEUKOCYTESUR SMALL (A) 02/19/2017 0001   Sepsis Labs: @LABRCNTIP (procalcitonin:4,lacticidven:4) )No results found for this or any previous visit (from the past 240 hour(s)).   Radiological Exams on Admission: Dg Chest 2 View  Result Date: 02/19/2017 CLINICAL DATA:  71 year old male with fever and confusion. EXAM: CHEST  2 VIEW  COMPARISON:  Chest radiograph dated 02/18/2017 FINDINGS: There is borderline cardiomegaly with vascular congestion similar to earlier radiograph. No focal consolidation, pleural effusion, or pneumothorax. Left lung base atelectatic  changes noted. There is atherosclerotic calcification of the aortic arch. No acute osseous pathology. IMPRESSION: 1. No focal consolidation. 2. Cardiomegaly with mild vascular congestion. No significant interval change compared to the earlier radiograph. Electronically Signed   By: Anner Crete M.D.   On: 02/19/2017 02:46   Dg Chest 2 View  Result Date: 02/18/2017 CLINICAL DATA:  Fever and cough EXAM: CHEST  2 VIEW COMPARISON:  04/12/2015 FINDINGS: Scarring at the bilateral lung bases. No focal consolidation or pleural effusion. Mild cardiomegaly with slight central vascular congestion. Aortic atherosclerosis. No pneumothorax. Degenerative changes of the spine. IMPRESSION: 1. Scarring at the left lung base.  No acute pulmonary infiltrate 2. Borderline to mild cardiomegaly with mild central congestion Electronically Signed   By: Donavan Foil M.D.   On: 02/18/2017 23:32    EKG: Independently reviewed.  Sinus tachycardia.  Assessment/Plan Principal Problem:   Sepsis ([place]) Active Problems:   Obstructive sleep apnea   Type 2 diabetes mellitus (H[facility])   Stage T1c Adenocarcinoma of the Prostate with a Gleason's Score of 3+3 and a PSA of 6.63 - Favorable Risk   Atrial fibrillation, persistent ([place]):  CHA2DS2-VASc Score - On Eliquis   Tobacco abuse    1. Sepsis -likely source could be intra-abdominal most likely prostate or could be from GI given the diarrhea.  For now we will continue with hydration follow cultures continue to monitor lactate levels pro calcitonin levels patient is on empiric antibiotics vancomycin and Zosyn.  Will probably get a CT abdomen and pelvis once patient is stable.  Influenza PCR is pending. 2. Rectal bleeding -could be from the recent prostate biopsy.  Holding Eliquis for now and closely follow CBC.  Type and screen.  Transfuse if hemoglobin drops below 7. 3. Atrial fibrillation -presently mildly in sinus tachycardia.  Continue amiodarone.  Will hold antihypertensives  due to low normal blood pressure. 4. Hypertension -holding antihypertensives due to low normal blood pressure. 5. Diabetes mellitus type 2 -hold oral hypoglycemics and place patient on sliding scale coverage. 6. Prostate cancer per oncologist. 7. Sleep apnea on CPAP.  Note that I am holding patient's tamsulosin also due to low normal blood pressure.  I have reviewed patient's old charts and labs.   DVT prophylaxis: SCDs due to rectal bleeding. Code Status: Full code. Family Communication: Family at the bedside. Disposition Plan: Home. Consults called: None. Admission status: Inpatient.   Rise Patience MD [facility] Pager [phone].  If 7PM-7AM, please contact night-coverage [email or web address] Password [facility]  02/19/2017, 5:38 AM

## 2017-02-19 NOTE — Progress Notes (Signed)
Pt states that he will self administer CPAP when ready for bed.  RT to monitor and assess as needed.  

## 2017-02-20 ENCOUNTER — Inpatient Hospital Stay (HOSPITAL_COMMUNITY): Payer: Federal, State, Local not specified - PPO

## 2017-02-20 DIAGNOSIS — I481 Persistent atrial fibrillation: Secondary | ICD-10-CM

## 2017-02-20 DIAGNOSIS — E1140 Type 2 diabetes mellitus with diabetic neuropathy, unspecified: Secondary | ICD-10-CM

## 2017-02-20 DIAGNOSIS — K922 Gastrointestinal hemorrhage, unspecified: Secondary | ICD-10-CM | POA: Diagnosis present

## 2017-02-20 DIAGNOSIS — E876 Hypokalemia: Secondary | ICD-10-CM

## 2017-02-20 DIAGNOSIS — A419 Sepsis, unspecified organism: Principal | ICD-10-CM

## 2017-02-20 DIAGNOSIS — C61 Malignant neoplasm of prostate: Secondary | ICD-10-CM

## 2017-02-20 LAB — BASIC METABOLIC PANEL
ANION GAP: 8 (ref 5–15)
BUN: 21 mg/dL — ABNORMAL HIGH (ref 6–20)
CALCIUM: 7.6 mg/dL — AB (ref 8.9–10.3)
CHLORIDE: 106 mmol/L (ref 101–111)
CO2: 20 mmol/L — ABNORMAL LOW (ref 22–32)
CREATININE: 1.87 mg/dL — AB (ref 0.61–1.24)
GFR calc non Af Amer: 35 mL/min — ABNORMAL LOW (ref >60)
GFR, EST AFRICAN AMERICAN: 40 mL/min — AB (ref >60)
Glucose, Bld: 137 mg/dL — ABNORMAL HIGH (ref 65–99)
Potassium: 4.4 mmol/L (ref 3.5–5.1)
SODIUM: 134 mmol/L — AB (ref 135–145)

## 2017-02-20 LAB — CREATININE, SERUM
Creatinine, Ser: 1.89 mg/dL — ABNORMAL HIGH (ref 0.61–1.24)
GFR, EST AFRICAN AMERICAN: 40 mL/min — AB (ref >60)
GFR, EST NON AFRICAN AMERICAN: 34 mL/min — AB (ref >60)

## 2017-02-20 LAB — VANCOMYCIN, RANDOM: Vancomycin Rm: 11

## 2017-02-20 LAB — CBC WITH DIFFERENTIAL/PLATELET
BASOS ABS: 0 10*3/uL (ref 0.0–0.1)
BASOS PCT: 0 %
EOS ABS: 0 10*3/uL (ref 0.0–0.7)
Eosinophils Relative: 0 %
HCT: 39.5 % (ref 39.0–52.0)
HEMOGLOBIN: 13.5 g/dL (ref 13.0–17.0)
Lymphocytes Relative: 6 %
Lymphs Abs: 0.9 10*3/uL (ref 0.7–4.0)
MCH: 33.2 pg (ref 26.0–34.0)
MCHC: 34.2 g/dL (ref 30.0–36.0)
MCV: 97.1 fL (ref 78.0–100.0)
MONOS PCT: 6 %
Monocytes Absolute: 1 10*3/uL (ref 0.1–1.0)
NEUTROS ABS: 14.1 10*3/uL — AB (ref 1.7–7.7)
NEUTROS PCT: 88 %
Platelets: 115 10*3/uL — ABNORMAL LOW (ref 150–400)
RBC: 4.07 MIL/uL — ABNORMAL LOW (ref 4.22–5.81)
RDW: 13.5 % (ref 11.5–15.5)
WBC: 16 10*3/uL — AB (ref 4.0–10.5)

## 2017-02-20 LAB — GLUCOSE, CAPILLARY
GLUCOSE-CAPILLARY: 132 mg/dL — AB (ref 65–99)
Glucose-Capillary: 201 mg/dL — ABNORMAL HIGH (ref 65–99)
Glucose-Capillary: 90 mg/dL (ref 65–99)
Glucose-Capillary: 129 mg/dL — ABNORMAL HIGH (ref 65–99)

## 2017-02-20 LAB — LACTIC ACID, PLASMA: LACTIC ACID, VENOUS: 1.2 mmol/L (ref 0.5–1.9)

## 2017-02-20 LAB — SODIUM, URINE, RANDOM: SODIUM UR: 25 mmol/L

## 2017-02-20 LAB — MAGNESIUM: Magnesium: 1.4 mg/dL — ABNORMAL LOW (ref 1.7–2.4)

## 2017-02-20 LAB — CREATININE, URINE, RANDOM: Creatinine, Urine: 49.53 mg/dL

## 2017-02-20 LAB — PROCALCITONIN: PROCALCITONIN: 19.28 ng/mL

## 2017-02-20 MED ORDER — NICOTINE 21 MG/24HR TD PT24
21.0000 mg | MEDICATED_PATCH | Freq: Every day | TRANSDERMAL | Status: DC
  Administered 2017-02-20 – 2017-02-25 (×5): 21 mg via TRANSDERMAL
  Filled 2017-02-20 (×6): qty 1

## 2017-02-20 MED ORDER — ALPRAZOLAM 0.25 MG PO TABS
0.2500 mg | ORAL_TABLET | Freq: Once | ORAL | Status: AC
  Administered 2017-02-20: 0.25 mg via ORAL
  Filled 2017-02-20: qty 1

## 2017-02-20 MED ORDER — MONTELUKAST SODIUM 10 MG PO TABS
10.0000 mg | ORAL_TABLET | Freq: Every day | ORAL | Status: DC
  Administered 2017-02-20 – 2017-02-24 (×5): 10 mg via ORAL
  Filled 2017-02-20 (×5): qty 1

## 2017-02-20 MED ORDER — SODIUM CHLORIDE 0.9 % IV SOLN
INTRAVENOUS | Status: DC
  Administered 2017-02-20 (×2): via INTRAVENOUS

## 2017-02-20 MED ORDER — DILTIAZEM HCL ER COATED BEADS 180 MG PO CP24
360.0000 mg | ORAL_CAPSULE | Freq: Every day | ORAL | Status: DC
  Administered 2017-02-20: 360 mg via ORAL
  Filled 2017-02-20: qty 2

## 2017-02-20 MED ORDER — HYDROCORTISONE 2.5 % RE CREA
TOPICAL_CREAM | Freq: Two times a day (BID) | RECTAL | Status: DC
  Administered 2017-02-20: 1 via TOPICAL
  Administered 2017-02-21 – 2017-02-25 (×6): via TOPICAL
  Filled 2017-02-20: qty 28.35

## 2017-02-20 MED ORDER — MAGNESIUM SULFATE 4 GM/100ML IV SOLN
4.0000 g | Freq: Once | INTRAVENOUS | Status: AC
  Administered 2017-02-20: 4 g via INTRAVENOUS
  Filled 2017-02-20: qty 100

## 2017-02-20 MED ORDER — TAMSULOSIN HCL 0.4 MG PO CAPS
0.4000 mg | ORAL_CAPSULE | Freq: Every day | ORAL | Status: DC
  Administered 2017-02-20 – 2017-02-24 (×5): 0.4 mg via ORAL
  Filled 2017-02-20 (×5): qty 1

## 2017-02-20 NOTE — Progress Notes (Signed)
Pharmacy Brief Note:  Vancomycin Dosing  Received electronic [employer] alert that SCr continues to rise (1.89 today, estimated normalized CrCl 36 mL/min/72kg) despite reduction in vancomycin dosage.  For now, have removed vancomycin from [facility] and have requested random vancomycin level for this AM.  ICU pharmacist will follow-up on day shift today.  Clayburn Pert, PharmD, BCPS Pager: [phone] 02/20/2017  5:23 AM

## 2017-02-20 NOTE — Progress Notes (Addendum)
PROGRESS NOTE    Ronald [name]  CVE:[id number] DOB: 08-12-1945 DOA: 02/18/2017 PCP: [name], MD   Brief Narrative:   Ronald [name] a 71 y.o.malewithhistory of atrial fibrillation, hypertension, sleep apnea, prostate cancer presents to the ER with complaints of fever and chills and has passed blood in the stools. Patient had a prostate biopsy 10 days ago by [name]. Was doing fine since then. Yesterday afternoon around 2 PM patient had loose bowel stools which was bloody. Also passed some clots. Later in the evening around 3 PM he got stuck in the rain while driving. Again he had a bowel movement later around 5 PM which also was bloody. Patient started developing fever and chills. Denies any chest pain productive cough nausea vomiting abdominal pain. Patient started having persistent fever chills and came to the ER.  Is admitted with sepsis likely due to UTI with elevated pro-calcitonin, H&H is stable, he does have history of hemorrhoids as well.     Assessment & Plan:   Principal Problem:   Sepsis ([place]) Active Problems:   Hyperlipidemia   Obstructive sleep apnea   Type 2 diabetes mellitus (HCC)   Stage T1c Adenocarcinoma of the Prostate with a Gleason's Score of 3+3 and a PSA of 6.63 - Favorable Risk   Atrial fibrillation, persistent ([place]):  CHA2DS2-VASc Score - On Eliquis   Hypokalemia   Tobacco abuse   Hypomagnesemia   GI bleed  #1 sepsis Questionable etiology.  Thought likely secondary to UTI as patient will recent prostate biopsy done approximately a month ago.  Pro-calcitonin and lactic acid levels were elevated and also concern for bacteremia.  Blood cultures pending.  Check a urine culture.  Fever curve trending down.  Check a CBC this morning.  Check a basic metabolic profile.  Check lactic acid and pro calcitonin levels.  CT abdomen and pelvis with no evidence of complication or relating to recent prostate biopsy.  Enlarged prostate in  general.  7-8 cm segment of small bowel in the central portion which in the setting of GI bleed questionable abnormal and could be secondary to inflammatory disease or mass lesion.  CT enterography could better evaluate that segment.  Some edema and fat around the gallbladder.  Prominent left lobe of liver which could suggest early cirrhosis.  Patient improving clinically.  Continue empiric IV Zosyn.  Discontinue IV vancomycin for now as MRSA PCR is negative.  Follow.  #2 ??  GI bleed/2 episodes of blood mixed with stool Patient with history of hemorrhoids and diverticulosis and colonic polyps status post removal per colonoscopy of 09/05/2016 showing tubular adenoma, hyperplastic polyps, no high-grade dysplasia or malignancy.  Patient states bleeding has improved.  Check a CBC this morning to follow-up on H&H.CT abdomen and pelvis with no evidence of complication or relating to recent prostate biopsy.  Enlarged prostate in general.  7-8 cm segment of small bowel in the central portion which in the setting of GI bleed questionable abnormal and could be secondary to inflammatory disease or mass lesion.  CT enterography could better evaluate that segment.  Some edema and fat around the gallbladder.  Prominent left lobe of liver which could suggest early cirrhosis.  We will discuss CT findings with GI.  3.  Paroxysmal atrial fibrillation CHA2DS2VASC at least 3.  Currently in normal sinus rhythm.  Continue amiodarone.  Eliquis on hold secondary to #2.  If no further bleeding and H&H remained stable we will likely try to resume Eliquis tomorrow.  4.  Obstructive sleep apnea CPAP nightly.  5.  Diabetes mellitus type 2 Hemoglobin A1c was 6.7 on December 05, 2016.  Continue to hold oral hypoglycemic agents.  CBGs have ranged from 128-132.  Continue sliding scale insulin.  6.  Hypertension Blood pressure improved with IV fluids.  Continue to hold lisinopril.  Resume home dose Cardizem and Flomax.  7.  Elevated  creatinine Check to be met.  Check a bladder scan.  Patient with history of urinary retention.  Patient with history of BPH.  iF urine and bladder scan is elevated will Place Foley catheter.  Continue to hold ACE inhibitor.  Resume Flomax.  8.  History of BPH/prostate cancer Status post prostate biopsy approximately 3 weeks ago.  No hematuria.  Patient with history of urinary retention.  Check a bladder scan.  9.  History of COPD with ongoing tobacco abuse Tobacco cessation.  Stable.  10. Hypokalemia/Hypomagnesemia Replete  DVT prophylaxis: SCDs Code Status: Full Family Communication: Updated patient and family at bedside. Disposition Plan: Remain in stepdown unit.   Consultants:   Urology: [name] 02/19/2017  Procedures:  CT abdomen and pelvis 02/19/2017  Chest x-ray 02/19/2017    Antimicrobials:   IV Zosyn 02/19/2017  IV vancomycin 02/19/2017>>>>> 02/20/2017   Subjective: Patient denies any chest pain.  Patient states improving shortness of breath.  Patient denies any further bloody bowel movements states stool is becoming more formed.  Patient states some improving urine output.  Foley catheter removed yesterday.  Patient denies any further fevers or chills.  Objective: Vitals:   02/20/17 0500 02/20/17 0600 02/20/17 0800 02/20/17 0900  BP:    (!) 154/93  Pulse: 74 82  83  Resp: (!) 24 (!) 30  (!) 25  Temp:   98.8 F (37.1 C)   TempSrc:   Oral   SpO2: (!) 86% 94%  92%  Weight:      Height:        Intake/Output Summary (Last 24 hours) at 02/20/2017 0932 Last data filed at 02/20/2017 0603 Gross per 24 hour  Intake 2612.5 ml  Output 570 ml  Net 2042.5 ml   Filed Weights   02/18/17 2242  Weight: 108 kg (238 lb)    Examination:  General exam: Appears calm and comfortable  Respiratory system: Clear to auscultation.  No wheezes, no crackles, no rhonchi.  Respiratory effort normal. Cardiovascular system: S1 & S2 heard, RRR. No JVD, murmurs, rubs,  gallops or clicks. No pedal edema. Gastrointestinal system: Abdomen is mildly distended, soft, nontender to palpation.  No organomegaly or masses felt. Normal bowel sounds heard. Central nervous system: Alert and oriented. No focal neurological deficits. Extremities: Symmetric 5 x 5 power. Skin: No rashes, lesions or ulcers Psychiatry: Judgement and insight appear normal. Mood & affect appropriate.     Data Reviewed: I have personally reviewed following labs and imaging studies  CBC: Recent Labs  Lab 02/18/17 2251 02/19/17 0623 02/19/17 1349 02/19/17 2018  WBC 13.5* 10.6*  --   --   NEUTROABS 12.0* 10.0*  --   --   HGB 15.9 13.9 13.5 13.4  HCT 45.7 40.2 39.3 39.4  MCV 96.2 96.9  --   --   PLT 197 144*  --   --    Basic Metabolic Panel: Recent Labs  Lab 02/18/17 2251 02/19/17 0623 02/20/17 0323 02/20/17 0838  NA 131* 133*  --  134*  K 3.5 2.8*  --  4.4  CL 98* 101  --  106  CO2 22 22  --  20*  GLUCOSE 152* 159*  --  137*  BUN 13 13  --  21*  CREATININE 1.00 1.31* 1.89* 1.87*  CALCIUM 8.8* 7.4*  --  7.6*  MG  --   --  1.4*  --    GFR: Estimated Creatinine Clearance: 45.3 mL/min (A) (by C-G formula based on SCr of 1.87 mg/dL (H)). Liver Function Tests: Recent Labs  Lab 02/18/17 2251 02/19/17 0623  AST 31 35  ALT 43 37  ALKPHOS 102 86  BILITOT 0.9 1.2  PROT 6.7 5.0*  ALBUMIN 4.2 2.8*   No results for input(s): LIPASE, AMYLASE in the last 168 hours. No results for input(s): AMMONIA in the last 168 hours. Coagulation Profile: Recent Labs  Lab 02/19/17 0623  INR 1.30   Cardiac Enzymes: Recent Labs  Lab 02/19/17 0623  TROPONINI <0.03   BNP (last 3 results) No results for input(s): PROBNP in the last 8760 hours. HbA1C: No results for input(s): HGBA1C in the last 72 hours. CBG: Recent Labs  Lab 02/19/17 0812 02/19/17 1142 02/19/17 1619 02/19/17 2107 02/20/17 0808  GLUCAP 180* 178* 120* 128* 132*   Lipid Profile: No results for input(s): CHOL,  HDL, LDLCALC, TRIG, CHOLHDL, LDLDIRECT in the last 72 hours. Thyroid Function Tests: Recent Labs    02/19/17 0623  TSH 1.650   Anemia Panel: No results for input(s): VITAMINB12, FOLATE, FERRITIN, TIBC, IRON, RETICCTPCT in the last 72 hours. Sepsis Labs: Recent Labs  Lab 02/19/17 0309 02/19/17 0623 02/19/17 0815 02/20/17 0838  PROCALCITON  --  14.60  --   --   LATICACIDVEN 4.62* 2.5* 2.2* 1.2    Recent Results (from the past 240 hour(s))  MRSA PCR Screening     Status: None   Collection Time: 02/19/17  5:25 AM  Result Value Ref Range Status   MRSA by PCR NEGATIVE NEGATIVE Final    Comment:        The GeneXpert MRSA Assay (FDA approved for NASAL specimens only), is one component of a comprehensive MRSA colonization surveillance program. It is not intended to diagnose MRSA infection nor to guide or monitor treatment for MRSA infections.          Radiology Studies: Ct Abdomen Pelvis Wo Contrast  Result Date: 02/19/2017 CLINICAL DATA:  Fever, chills and gastrointestinal bleeding. Prostate biopsy 10 days ago. EXAM: CT ABDOMEN AND PELVIS WITHOUT CONTRAST TECHNIQUE: Multidetector CT imaging of the abdomen and pelvis was performed following the standard protocol without IV contrast. COMPARISON:  None. FINDINGS: Lower chest: Minimal basilar atelectasis. No significant chest finding. Coronary artery calcification is noted. Hepatobiliary: Relative prominence of the left lobe suggest the possibility of early cirrhosis. This is not definite. No sign of focal liver lesion. No calcified gallstones. Question mild stranding of the fat around the gallbladder. Is there clinical concern about cholecystitis? Pancreas: Normal Spleen: Normal Adrenals/Urinary Tract: Mild adrenal hyperplasia. Possible 1 cm adenoma on the left. No worrisome adrenal finding. Kidneys are normal in size. There is some vascular calcification. No evidence of stone disease or focal lesion. No hydronephrosis. Bladder is  thick-walled. Stomach/Bowel: Diverticulosis of the colon without evidence of diverticulitis. In the mid small bowel, there is a 7 or 8 cm segment of the small intestine which is not fill with contrast and could be abnormal due to enteritis or a mass lesion. Whereas this could simply P poor filling and contraction, in the setting of gastrointestinal bleeding, this is difficult to ignore. CT enterography could  be employed to evaluate this segment of small intestine. Vascular/Lymphatic: Aortic atherosclerosis. No aneurysm. IVC is normal. No retroperitoneal adenopathy. Reproductive: Prostate gland is enlarged. No discernible focal lesion. No complication of biopsy detectable. Other: No free fluid or air. Musculoskeletal: Ordinary lower lumbar degenerative changes. IMPRESSION: No evidence of complication or relating to the recent prostate biopsy. Enlarged prostate in general. 7-8 cm segment of small bowel in the central portion which, in the setting of gastrointestinal bleeding, is questionably abnormal and could be secondary to inflammatory disease or mass lesion. CT enterography could better evaluate that segment. Some edema in the fat around the gallbladder. Is there clinical concern regarding any potential for cholecystitis? If so, ultrasound would be suggested. Prominence of the left lobe of the liver which could suggest early cirrhosis. Aortic atherosclerosis. Electronically Signed   By: Nelson Chimes M.D.   On: 02/19/2017 14:59   Dg Chest 2 View  Result Date: 02/19/2017 CLINICAL DATA:  71 year old male with fever and confusion. EXAM: CHEST  2 VIEW COMPARISON:  Chest radiograph dated 02/18/2017 FINDINGS: There is borderline cardiomegaly with vascular congestion similar to earlier radiograph. No focal consolidation, pleural effusion, or pneumothorax. Left lung base atelectatic changes noted. There is atherosclerotic calcification of the aortic arch. No acute osseous pathology. IMPRESSION: 1. No focal  consolidation. 2. Cardiomegaly with mild vascular congestion. No significant interval change compared to the earlier radiograph. Electronically Signed   By: Anner Crete M.D.   On: 02/19/2017 02:46   Dg Chest 2 View  Result Date: 02/18/2017 CLINICAL DATA:  Fever and cough EXAM: CHEST  2 VIEW COMPARISON:  04/12/2015 FINDINGS: Scarring at the bilateral lung bases. No focal consolidation or pleural effusion. Mild cardiomegaly with slight central vascular congestion. Aortic atherosclerosis. No pneumothorax. Degenerative changes of the spine. IMPRESSION: 1. Scarring at the left lung base.  No acute pulmonary infiltrate 2. Borderline to mild cardiomegaly with mild central congestion Electronically Signed   By: Donavan Foil M.D.   On: 02/18/2017 23:32        Scheduled Meds: . amiodarone  200 mg Oral Daily  . atorvastatin  10 mg Oral QPM  . azelastine  1 spray Each Nare BID  . heparin subcutaneous  5,000 Units Subcutaneous Q8H  . insulin aspart  0-9 Units Subcutaneous TID WC  . montelukast  10 mg Oral QHS  . tamsulosin  0.4 mg Oral QPC supper   Continuous Infusions: . 0.9 % NaCl with KCl 40 mEq / L 100 mL/hr (02/20/17 0214)  . magnesium sulfate 1 - 4 g bolus IVPB 4 g (02/20/17 0917)  . piperacillin-tazobactam (ZOSYN)  IV Stopped (02/20/17 0604)     LOS: 1 day    Time spent: 66 minutes    Zayley Arras, MD [facility] Pager [phone]  If 7PM-7AM, please contact night-coverage [email or web address] Password [facility] 02/20/2017, 9:32 AM

## 2017-02-20 NOTE — Progress Notes (Signed)
Pharmacy Antibiotic Note  Ronald [name] is a 71 y.o. male admitted on 02/18/2017 with sepsis- UTI vs prostatitis as most likely source.  PMH pertinent for prostate CA s/p biopsy a few weeks ago.  He presents today with hematuria, urinary retention & fever.  Pharmacy has been consulted for Vancomycin & Zosyn dosing.  11/14: SCr continues to rise from 1 >> 1.3 >> 1.89. Vanc order was d/c'd this AM and random vanc level checked to see if vanc was contributing to acute renal dysfunction; however, vanc level was just 11 so within normal limits for trough goal of 10-15 for possible urinary source. Note also MRSA PCR was negative and patient continues to be afebrile since yesterday 11/13  Plan:  Continue Zosyn 3.375gm IV Q8h to be infused over 4hrs  Discussed with Md, will discontinue vancomycin for now and continue to follow cultures  Monitor renal function and cx data   Height: 5\' 11"  (180.3 cm) Weight: 238 lb (108 kg) IBW/kg (Calculated) : 75.3  Temp (24hrs), Avg:98.5 F (36.9 C), Min:97.8 F (36.6 C), Max:99.1 F (37.3 C)  Recent Labs  Lab 02/18/17 2251 02/18/17 2324 02/19/17 0309 02/19/17 0623 02/19/17 0815 02/20/17 0323 02/20/17 0740  WBC 13.5*  --   --  10.6*  --   --   --   CREATININE 1.00  --   --  1.31*  --  1.89*  --   LATICACIDVEN  --  1.57 4.62* 2.5* 2.2*  --   --   VANCORANDOM  --   --   --   --   --   --  11    Estimated Creatinine Clearance: 44.8 mL/min (A) (by C-G formula based on SCr of 1.89 mg/dL (H)).    No Known Allergies  Antimicrobials this admission: 11/13 Zosyn >>  11/13 Vanc >> 11/14 11/13 Cipro/Rocephin x1   Dose adjustments this admission:  Microbiology results: 11/12 BCx: sent 11/13 MRSA PCR: neg 11/13 urine: ordered (UA negative)   Thank you for allowing pharmacy to be a part of this patient's care.   Adrian Saran, PharmD, BCPS Pager [phone] 02/20/2017 8:37 AM

## 2017-02-21 DIAGNOSIS — R0902 Hypoxemia: Secondary | ICD-10-CM

## 2017-02-21 DIAGNOSIS — N179 Acute kidney failure, unspecified: Secondary | ICD-10-CM

## 2017-02-21 DIAGNOSIS — Z720 Tobacco use: Secondary | ICD-10-CM

## 2017-02-21 DIAGNOSIS — G4733 Obstructive sleep apnea (adult) (pediatric): Secondary | ICD-10-CM

## 2017-02-21 LAB — PROCALCITONIN: Procalcitonin: 6.29 ng/mL

## 2017-02-21 LAB — CBC WITH DIFFERENTIAL/PLATELET
BASOS ABS: 0 10*3/uL (ref 0.0–0.1)
BASOS PCT: 0 %
Eosinophils Absolute: 0 10*3/uL (ref 0.0–0.7)
Eosinophils Relative: 0 %
HEMATOCRIT: 37.6 % — AB (ref 39.0–52.0)
HEMOGLOBIN: 12.9 g/dL — AB (ref 13.0–17.0)
Lymphocytes Relative: 8 %
Lymphs Abs: 0.9 10*3/uL (ref 0.7–4.0)
MCH: 33.5 pg (ref 26.0–34.0)
MCHC: 34.3 g/dL (ref 30.0–36.0)
MCV: 97.7 fL (ref 78.0–100.0)
MONOS PCT: 6 %
Monocytes Absolute: 0.7 10*3/uL (ref 0.1–1.0)
NEUTROS ABS: 10.5 10*3/uL — AB (ref 1.7–7.7)
NEUTROS PCT: 86 %
Platelets: 105 10*3/uL — ABNORMAL LOW (ref 150–400)
RBC: 3.85 MIL/uL — ABNORMAL LOW (ref 4.22–5.81)
RDW: 13.6 % (ref 11.5–15.5)
WBC: 12.1 10*3/uL — ABNORMAL HIGH (ref 4.0–10.5)

## 2017-02-21 LAB — BASIC METABOLIC PANEL
ANION GAP: 9 (ref 5–15)
BUN: 21 mg/dL — ABNORMAL HIGH (ref 6–20)
CALCIUM: 7.5 mg/dL — AB (ref 8.9–10.3)
CHLORIDE: 108 mmol/L (ref 101–111)
CO2: 18 mmol/L — AB (ref 22–32)
Creatinine, Ser: 1.73 mg/dL — ABNORMAL HIGH (ref 0.61–1.24)
GFR calc non Af Amer: 38 mL/min — ABNORMAL LOW (ref >60)
GFR, EST AFRICAN AMERICAN: 44 mL/min — AB (ref >60)
Glucose, Bld: 129 mg/dL — ABNORMAL HIGH (ref 65–99)
POTASSIUM: 3.9 mmol/L (ref 3.5–5.1)
Sodium: 135 mmol/L (ref 135–145)

## 2017-02-21 LAB — GLUCOSE, CAPILLARY
GLUCOSE-CAPILLARY: 118 mg/dL — AB (ref 65–99)
GLUCOSE-CAPILLARY: 108 mg/dL — AB (ref 65–99)
Glucose-Capillary: 114 mg/dL — ABNORMAL HIGH (ref 65–99)
Glucose-Capillary: 226 mg/dL — ABNORMAL HIGH (ref 65–99)

## 2017-02-21 LAB — MAGNESIUM: MAGNESIUM: 2.2 mg/dL (ref 1.7–2.4)

## 2017-02-21 LAB — URINE CULTURE: Culture: NO GROWTH

## 2017-02-21 MED ORDER — FUROSEMIDE 10 MG/ML IJ SOLN
40.0000 mg | Freq: Two times a day (BID) | INTRAMUSCULAR | Status: AC
  Administered 2017-02-21 (×2): 40 mg via INTRAVENOUS
  Filled 2017-02-21 (×2): qty 4

## 2017-02-21 MED ORDER — SODIUM CHLORIDE 0.9 % IV SOLN: INTRAVENOUS | Status: DC

## 2017-02-21 MED ORDER — APIXABAN 5 MG PO TABS
5.0000 mg | ORAL_TABLET | Freq: Two times a day (BID) | ORAL | Status: DC
  Administered 2017-02-21 – 2017-02-25 (×9): 5 mg via ORAL
  Filled 2017-02-21 (×9): qty 1

## 2017-02-21 MED ORDER — ALPRAZOLAM 0.25 MG PO TABS
0.2500 mg | ORAL_TABLET | Freq: Once | ORAL | Status: AC
  Administered 2017-02-21: 0.25 mg via ORAL
  Filled 2017-02-21: qty 1

## 2017-02-21 MED ORDER — DILTIAZEM HCL ER COATED BEADS 180 MG PO CP24
360.0000 mg | ORAL_CAPSULE | Freq: Every day | ORAL | Status: DC
  Administered 2017-02-21 – 2017-02-22 (×2): 360 mg via ORAL
  Filled 2017-02-21 (×2): qty 2

## 2017-02-21 NOTE — Evaluation (Signed)
Occupational Therapy Evaluation Patient Details Name: Ronald [name] MRN: [id number] DOB: [birth year] Today's Date: 02/21/2017    History of Present Illness This 71 year old man was admitted with fever, chills, and blood in stool.  Pt recently had prostate biopsy. Dx'd with sepsis.  PMH:  A Fib and prostate CA, DM and COPD   Clinical Impression   Pt was admitted for the above.  At baseline, he is independent for adls, but recently had help from wife with LB dressing after bx.  He walked with min guard for safety and no LOB.  Sats dropped to 85% on 1 1/2 liters and educated on pursed lip breathing.  Recommended pt sit for shower until he regains his strength.  Wife can assist as needed.      Follow Up Recommendations  Supervision/Assistance - 24 hour    Equipment Recommendations  None recommended by OT    Recommendations for Other Services       Precautions / Restrictions Precautions Precautions: Fall Restrictions Weight Bearing Restrictions: No      Mobility Bed Mobility               General bed mobility comments: oob  Transfers Overall transfer level: Needs assistance Equipment used: None Transfers: Sit to/from Stand Sit to Stand: Min guard         General transfer comment: for safety    Balance                                           ADL either performed or assessed with clinical judgement   ADL                                         General ADL Comments: pt is near baseline for adls. He has had wife help recently with socks/shoes after bx.  Ambulated with min guard for safety; no LOB. Wife will assist as needed at home     Vision         Perception     Praxis      Pertinent Vitals/Pain Pain Assessment: No/denies pain     Hand Dominance     Extremity/Trunk Assessment Upper Extremity Assessment Upper Extremity Assessment: Overall WFL for tasks assessed           Communication  Communication Communication: No difficulties   Cognition Arousal/Alertness: Awake/alert Behavior During Therapy: WFL for tasks assessed/performed Overall Cognitive Status: Within Functional Limits for tasks assessed                                     General Comments  sats dropped to 86 on 2 liters 02.  Pt does not wear 02 at baseline    Exercises     Shoulder Instructions      Home Living Family/patient expects to be discharged to:: Private residence Living Arrangements: Spouse/significant other Available Help at Discharge: Family   Home Access: Elevator           Bathroom Shower/Tub: Hospital doctor Toilet: Handicapped height     Home Equipment: Shower seat - built in          Prior Functioning/Environment Level of Independence: Independent  OT Problem List:        OT Treatment/Interventions:      OT Goals(Current goals can be found in the care plan section) Acute Rehab OT Goals Patient Stated Goal: return to independence OT Goal Formulation: All assessment and education complete, DC therapy  OT Frequency:     Barriers to D/C:            Co-evaluation PT/OT/SLP Co-Evaluation/Treatment: Yes Reason for Co-Treatment: For patient/therapist safety PT goals addressed during session: Mobility/safety with mobility OT goals addressed during session: ADL's and self-care      AM-PAC PT "6 Clicks" Daily Activity     Outcome Measure Help from another person eating meals?: None Help from another person taking care of personal grooming?: A Little Help from another person toileting, which includes using toliet, bedpan, or urinal?: A Little Help from another person bathing (including washing, rinsing, drying)?: A Little Help from another person to put on and taking off regular upper body clothing?: A Little Help from another person to put on and taking off regular lower body clothing?: A Little 6 Click Score: 19    End of Session    Activity Tolerance: Patient tolerated treatment well Patient left: in chair;with call bell/phone within reach;with chair alarm set;with family/visitor present  OT Visit Diagnosis: Muscle weakness (generalized) (M62.81)                Time: 6004-5997 OT Time Calculation (min): 27 min Charges:  OT General Charges $OT Visit: 1 Visit OT Evaluation $OT Eval Low Complexity: 1 Low G-Codes:     [place], OTR/L [phone] 02/21/2017  Ronald [name] 02/21/2017, 3:26 PM

## 2017-02-21 NOTE — Progress Notes (Signed)
PROGRESS NOTE    Ronald [name]  ZOX:[id number] DOB: Dec 23, 1945 DOA: 02/18/2017 PCP: Ronald Post, MD   Brief Narrative:   Ronald [name] a 71 y.o.malewithhistory of atrial fibrillation, hypertension, sleep apnea, prostate cancer presents to the ER with complaints of fever and chills and has passed blood in the stools. Patient had a prostate biopsy 10 days ago by Dr. Alyson [name]. Was doing fine since then. Yesterday afternoon around 2 PM patient had loose bowel stools which was bloody. Also passed some clots. Later in the evening around 3 PM he got stuck in the rain while driving. Again he had a bowel movement later around 5 PM which also was bloody. Patient started developing fever and chills. Denies any chest pain productive cough nausea vomiting abdominal pain. Patient started having persistent fever chills and came to the ER.  Is admitted with sepsis likely due to UTI with elevated pro-calcitonin, H&H is stable, he does have history of hemorrhoids as well.     Assessment & Plan:   Principal Problem:   Sepsis ([place]) Active Problems:   Hyperlipidemia   Obstructive sleep apnea   Type 2 diabetes mellitus (HCC)   Stage T1c Adenocarcinoma of the Prostate with a Gleason's Score of 3+3 and a PSA of 6.63 - Favorable Risk   Atrial fibrillation, persistent ([place]):  CHA2DS2-VASc Score - On Eliquis   Hypokalemia   Tobacco abuse   Hypomagnesemia   GI bleed  #1 sepsis Questionable etiology.  Thought likely secondary to UTI as patient with recent prostate biopsy done approximately 3-4 weeks ago.  Pro-calcitonin and lactic acid levels were elevated and also concern for bacteremia.  Blood cultures pending with no growth to date.  Urine cultures negative. Fever curve trending down.  Leukocytosis trending down.  Patient currently afebrile.  Lactic acid and pro calcitonin levels trending down. CT abdomen and pelvis with no evidence of complication or relating to recent prostate biopsy.   Enlarged prostate in general.  7-8 cm segment of small bowel in the central portion which in the setting of GI bleed questionable abnormal and could be secondary to inflammatory disease or mass lesion.  CT enterography could better evaluate that segment.  Some edema and fat around the gallbladder.  Prominent left lobe of liver which could suggest early cirrhosis.  Patient improving clinically.  Continue empiric IV Zosyn.  Discontinued IV vancomycin for now as MRSA PCR is negative.  Follow.  #2 ??  GI bleed/2 episodes of blood mixed with stool Patient with history of hemorrhoids and diverticulosis and colonic polyps status [name] removal per colonoscopy of 09/05/2016 showing tubular adenoma, hyperplastic polyps, no high-grade dysplasia or malignancy.  Patient with no further bloody bowel movements.  H&H has remained stable. CT abdomen and pelvis with no evidence of complication or relating to recent prostate biopsy.  Enlarged prostate in general.  7-8 cm segment of small bowel in the central portion which in the setting of GI bleed questionable abnormal and could be secondary to inflammatory disease or mass lesion.  CT enterography could better evaluate that segment.  Some edema and fat around the gallbladder.  Prominent left lobe of liver which could suggest early cirrhosis.  Unable to get CT enterography due to patient's worsening renal function.  Patient however improving clinically.  Renal function slowly starting to trend down.  Patient will likely need to follow-up in the outpatient setting.    3.  Paroxysmal atrial fibrillation CHA2DS2VASC at least 3.  Currently in normal sinus rhythm.  Continue amiodarone.  Eliquis on hold secondary to #2.  Patient with no further bleeding.  Hemoglobin stable.  Will resume home regimen Eliquis and follow closely.   4.  Obstructive sleep apnea CPAP nightly.  5.  Diabetes mellitus type 2 Hemoglobin A1c was 6.7 on December 05, 2016.  CBGs have ranged from 114-201.   Continue sliding scale insulin.  Continue to hold oral hypoglycemic agents.  6.  Hypotension/HTN Blood pressure improved with IV fluids.  ACE inhibitor on hold due to worsening renal function and also hypotension on admission.  Blood pressure now elevated.  Patient's Cardizem and Flomax were resumed 02/20/2017.  Follow for now.    7.  Acute renal failure Likely secondary to a prerenal azotemia as patient was noted to initially be hypotensive on admission with sepsis.  Pain had 51 cc of urine in 02/20/2017.  Urinalysis with small leukocytes, nitrite negative, 6-30 WBCs.  Renal function with some improvement with hydration, patient now noted to have some volume overload.  Saline lock IV fluids.  Lasix 40 mg IV every 12 hours x2 doses.  Flomax resumed.  Continue to hold ACE inhibitor.  Follow.   8.  History of BPH/prostate cancer Status [name] prostate biopsy approximately 3 weeks ago.  No hematuria.  Patient with history of urinary retention.  Bladder scan with only 51 cc noted yesterday 02/20/2017.  Patient with good urine output.   9.  History of COPD with ongoing tobacco abuse Tobacco cessation.  Stable. Patient states has outpatient appointment with pulmonologist early next week.  10. Hypokalemia/Hypomagnesemia Repleted.  #11 hypoxia/volume overload Patient with sats of 88-89% on nasal cannula.  Hypoxia likely secondary to volume overload secondary to aggressive hydration on presentation.  Patient with 1-2+ bilateral lower extremity edema.  Patient is +6.6 L during this hospitalization.  Patient on at a minimum Lasix 40 mg orally daily prior to admission which was discontinued on admission secondary to hypotension.  Patient's current weight of 245 pounds from 238 pounds on admission.  Saline lock IV fluids.  Lasix 40 mg IV every 12 hours x2 doses.  Follow.  DVT prophylaxis: eliquis Code Status: Full Family Communication: Updated patient and wife at bedside. Disposition Plan: Remain in stepdown  unit.   Consultants:   Urology: Dr. Alyson [name] 02/19/2017  Procedures:  CT abdomen and pelvis 02/19/2017  Chest x-ray 02/19/2017    Antimicrobials:   IV Zosyn 02/19/2017  IV vancomycin 02/19/2017>>>>> 02/20/2017   Subjective: Patient sitting up in chair.  Patient denies any chest pain.  Patient complaining of some shortness of breath when he lays flat.  Patient noted with some hypoxia with sats of around 88, 89% on nasal cannula.  Patient not on home O2.  Patient denies any further bloody bowel movements.  Patient endorses good urine output.  Patient tolerating current diet.  Objective: Vitals:   02/21/17 0400 02/21/17 0500 02/21/17 0600 02/21/17 0800  BP: 129/85  120/63 (!) 150/94  Pulse: 84 88 76 83  Resp: (!) 22 (!) 26 (!) 26 (!) 34  Temp:    98 F (36.7 C)  TempSrc:    Oral  SpO2: 92% 94% 95% (!) 86%  Weight:  111.5 kg (245 lb 13 oz)    Height:        Intake/Output Summary (Last 24 hours) at 02/21/2017 0946 Last data filed at 02/21/2017 0600 Gross per 24 hour  Intake 3730 ml  Output 1180 ml  Net 2550 ml   Filed Weights   02/18/17 2242  02/20/17 1840 02/21/17 0500  Weight: 108 kg (238 lb) 113.3 kg (249 lb 12.5 oz) 111.5 kg (245 lb 13 oz)    Examination:  General exam: NAD  Respiratory system: Some bibasilar crackles.  No wheezing.  Respiratory effort normal. Cardiovascular system: S1 & S2 heard, RRR. No JVD, murmurs, rubs, gallops or clicks.  1-2+ bilateral lower extremity edema.  Gastrointestinal system: Abdomen is mildly distended, soft, nontender to palpation.  No organomegaly or masses felt. Normal bowel sounds heard. Central nervous system: Alert and oriented. No focal neurological deficits. Extremities: Symmetric 5 x 5 power. Skin: No rashes, lesions or ulcers Psychiatry: Judgement and insight appear normal. Mood & affect appropriate.     Data Reviewed: I have personally reviewed following labs and imaging studies  CBC: Recent Labs  Lab  02/18/17 2251 02/19/17 0623 02/19/17 1349 02/19/17 2018 02/20/17 0838 02/21/17 0340  WBC 13.5* 10.6*  --   --  16.0* 12.1*  NEUTROABS 12.0* 10.0*  --   --  14.1* 10.5*  HGB 15.9 13.9 13.5 13.4 13.5 12.9*  HCT 45.7 40.2 39.3 39.4 39.5 37.6*  MCV 96.2 96.9  --   --  97.1 97.7  PLT 197 144*  --   --  115* 160*   Basic Metabolic Panel: Recent Labs  Lab 02/18/17 2251 02/19/17 0623 02/20/17 0323 02/20/17 0838 02/21/17 0340  NA 131* 133*  --  134* 135  K 3.5 2.8*  --  4.4 3.9  CL 98* 101  --  106 108  CO2 22 22  --  20* 18*  GLUCOSE 152* 159*  --  137* 129*  BUN 13 13  --  21* 21*  CREATININE 1.00 1.31* 1.89* 1.87* 1.73*  CALCIUM 8.8* 7.4*  --  7.6* 7.5*  MG  --   --  1.4*  --  2.2   GFR: Estimated Creatinine Clearance: 49.7 mL/min (A) (by C-G formula based on SCr of 1.73 mg/dL (H)). Liver Function Tests: Recent Labs  Lab 02/18/17 2251 02/19/17 0623  AST 31 35  ALT 43 37  ALKPHOS 102 86  BILITOT 0.9 1.2  PROT 6.7 5.0*  ALBUMIN 4.2 2.8*   No results for input(s): LIPASE, AMYLASE in the last 168 hours. No results for input(s): AMMONIA in the last 168 hours. Coagulation Profile: Recent Labs  Lab 02/19/17 0623  INR 1.30   Cardiac Enzymes: Recent Labs  Lab 02/19/17 0623  TROPONINI <0.03   BNP (last 3 results) No results for input(s): PROBNP in the last 8760 hours. HbA1C: No results for input(s): HGBA1C in the last 72 hours. CBG: Recent Labs  Lab 02/20/17 0808 02/20/17 1225 02/20/17 1735 02/20/17 2106 02/21/17 0805  GLUCAP 132* 201* 90 129* 114*   Lipid Profile: No results for input(s): CHOL, HDL, LDLCALC, TRIG, CHOLHDL, LDLDIRECT in the last 72 hours. Thyroid Function Tests: Recent Labs    02/19/17 0623  TSH 1.650   Anemia Panel: No results for input(s): VITAMINB12, FOLATE, FERRITIN, TIBC, IRON, RETICCTPCT in the last 72 hours. Sepsis Labs: Recent Labs  Lab 02/19/17 0309 02/19/17 0623 02/19/17 0815 02/20/17 0838 02/21/17 0340    PROCALCITON  --  14.60  --  19.28 6.29  LATICACIDVEN 4.62* 2.5* 2.2* 1.2  --     Recent Results (from the past 240 hour(s))  Culture, blood (Routine x 2)     Status: None (Preliminary result)   Collection Time: 02/18/17 11:10 PM  Result Value Ref Range Status   Specimen Description BLOOD BLOOD RIGHT ARM  Final   Special Requests   Final    BOTTLES DRAWN AEROBIC AND ANAEROBIC Blood Culture adequate volume   Culture   Final    NO GROWTH 1 DAY Performed at [facility], [place] [address] [place], [place] [address]    Report Status PENDING  Incomplete  Culture, blood (Routine x 2)     Status: None (Preliminary result)   Collection Time: 02/18/17 11:10 PM  Result Value Ref Range Status   Specimen Description BLOOD BLOOD LEFT ARM  Final   Special Requests   Final    BOTTLES DRAWN AEROBIC AND ANAEROBIC Blood Culture adequate volume   Culture   Final    NO GROWTH 1 DAY Performed at [facility], [place] [address] [place], [place] [address]    Report Status PENDING  Incomplete  MRSA PCR Screening     Status: None   Collection Time: 02/19/17  5:25 AM  Result Value Ref Range Status   MRSA by PCR NEGATIVE NEGATIVE Final    Comment:        The GeneXpert MRSA Assay (FDA approved for NASAL specimens only), is one component of a comprehensive MRSA colonization surveillance program. It is not intended to diagnose MRSA infection nor to guide or monitor treatment for MRSA infections.   Culture, Urine     Status: None   Collection Time: 02/20/17 10:11 AM  Result Value Ref Range Status   Specimen Description URINE, CLEAN CATCH  Final   Special Requests NONE  Final   Culture   Final    NO GROWTH Performed at [facility], [address] [place], [place] [address]    Report Status 02/21/2017 FINAL  Final         Radiology Studies: Ct Abdomen Pelvis Wo Contrast  Result Date: 02/19/2017 CLINICAL DATA:  Fever, chills and gastrointestinal bleeding. Prostate biopsy  10 days ago. EXAM: CT ABDOMEN AND PELVIS WITHOUT CONTRAST TECHNIQUE: Multidetector CT imaging of the abdomen and pelvis was performed following the standard protocol without IV contrast. COMPARISON:  None. FINDINGS: Lower chest: Minimal basilar atelectasis. No significant chest finding. Coronary artery calcification is noted. Hepatobiliary: Relative prominence of the left lobe suggest the possibility of early cirrhosis. This is not definite. No sign of focal liver lesion. No calcified gallstones. Question mild stranding of the fat around the gallbladder. Is there clinical concern about cholecystitis? Pancreas: Normal Spleen: Normal Adrenals/Urinary Tract: Mild adrenal hyperplasia. Possible 1 cm adenoma on the left. No worrisome adrenal finding. Kidneys are normal in size. There is some vascular calcification. No evidence of stone disease or focal lesion. No hydronephrosis. Bladder is thick-walled. Stomach/Bowel: Diverticulosis of the colon without evidence of diverticulitis. In the mid small bowel, there is a 7 or 8 cm segment of the small intestine which is not fill with contrast and could be abnormal due to enteritis or a mass lesion. Whereas this could simply P poor filling and contraction, in the setting of gastrointestinal bleeding, this is difficult to ignore. CT enterography could be employed to evaluate this segment of small intestine. Vascular/Lymphatic: Aortic atherosclerosis. No aneurysm. IVC is normal. No retroperitoneal adenopathy. Reproductive: Prostate gland is enlarged. No discernible focal lesion. No complication of biopsy detectable. Other: No free fluid or air. Musculoskeletal: Ordinary lower lumbar degenerative changes. IMPRESSION: No evidence of complication or relating to the recent prostate biopsy. Enlarged prostate in general. 7-8 cm segment of small bowel in the central portion which, in the setting of gastrointestinal  bleeding, is questionably abnormal and could be secondary to inflammatory  disease or mass lesion. CT enterography could better evaluate that segment. Some edema in the fat around the gallbladder. Is there clinical concern regarding any potential for cholecystitis? If so, ultrasound would be suggested. Prominence of the left lobe of the liver which could suggest early cirrhosis. Aortic atherosclerosis. Electronically Signed   By: Nelson Chimes M.D.   On: 02/19/2017 14:59   US Renal  Result Date: 02/20/2017 CLINICAL DATA:  Acute renal failure. History of diabetes, prostate malignancy, and hypertension. EXAM: RENAL / URINARY TRACT ULTRASOUND COMPLETE COMPARISON:  Abdominal CT scan of February 19, 2017 FINDINGS: Right Kidney: Length: 14 cm. The renal cortical echotexture remains lower than that of the liver. There is no focal mass nor hydronephrosis. Left Kidney: Length: 13.7 cm. The renal cortical echotexture is similar to that on the right. There is no focal mass nor hydronephrosis. Bladder: The urinary bladder wall appears trabeculated. There is no discrete bladder wall mass. No echogenic debris or stones are observed. IMPRESSION: Normal appearance of both kidneys. Trabeculated appearance of the urinary bladder. Electronically Signed   By: David  Martinique M.D.   On: 02/20/2017 12:29        Scheduled Meds: . amiodarone  200 mg Oral Daily  . atorvastatin  10 mg Oral QPM  . azelastine  1 spray Each Nare BID  . heparin subcutaneous  5,000 Units Subcutaneous Q8H  . hydrocortisone   Topical BID  . insulin aspart  0-9 Units Subcutaneous TID WC  . montelukast  10 mg Oral QHS  . nicotine  21 mg Transdermal Daily  . tamsulosin  0.4 mg Oral QPC supper   Continuous Infusions: . sodium chloride    . piperacillin-tazobactam (ZOSYN)  IV 3.375 g (02/21/17 0554)     LOS: 2 days    Time spent: 81 minutes    Auria Mckinlay, MD [facility] Pager [phone]  If 7PM-7AM, please contact night-coverage [email or web address] Password [email or web address] 02/21/2017, 9:46 AM

## 2017-02-21 NOTE — Evaluation (Signed)
Physical Therapy Evaluation Patient Details Name: Ronald [name] MRN: [id number] DOB: 29-Jul-1945 Today's Date: 02/21/2017   History of Present Illness  This 71 year old man was admitted with fever, chills, and blood in stool.  Pt recently had prostate biopsy. Dx'd with sepsis.  PMH:  A Fib and prostate CA, DM and COPD  Clinical Impression  The patient with noted dyspnea while ambulating, started with 1.5 L, then increased to 2 due to decreased saturation to 86%. Replaced to 1.5 after rest in recliner. Pt admitted with above diagnosis. Pt currently with functional limitations due to the deficits listed below (see PT Problem List).  Pt will benefit from skilled PT to increase their independence and safety with mobility to allow discharge to the venue listed below.       Follow Up Recommendations No PT follow up    Equipment Recommendations  None recommended by PT    Recommendations for Other Services       Precautions / Restrictions Precautions Precautions: Fall Precaution Comments: monitor O2, needed it today Restrictions Weight Bearing Restrictions: No      Mobility  Bed Mobility               General bed mobility comments: oob  Transfers Overall transfer level: Needs assistance Equipment used: None Transfers: Sit to/from Stand Sit to Stand: Min guard         General transfer comment: for safety  Ambulation/Gait Ambulation/Gait assistance: Min guard Ambulation Distance (Feet): 400 Feet Assistive device: None Gait Pattern/deviations: Step-through pattern   Gait velocity interpretation: Below normal speed for age/gender General Gait Details: slow  speed, stop for rest break standing and check sats. On 1.5 L with sats 86, placed on 2 for sats to gradually return to > 90 after return to room.  Stairs            Wheelchair Mobility    Modified Rankin (Stroke Patients Only)       Balance Overall balance assessment: Modified Independent                                            Pertinent Vitals/Pain Pain Assessment: No/denies pain    Home Living Family/patient expects to be discharged to:: Private residence Living Arrangements: Spouse/significant other Available Help at Discharge: Family   Home Access: Stairs to enter Entrance Stairs-Rails: Psychiatric nurse of Steps: 3 Home Layout: Two level Home Equipment: Civil engineer, contracting - built in      Prior Function Level of Independence: Independent               Journalist, newspaper        Extremity/Trunk Assessment   Upper Extremity Assessment Upper Extremity Assessment: Defer to OT evaluation    Lower Extremity Assessment Lower Extremity Assessment: Overall WFL for tasks assessed    Cervical / Trunk Assessment Cervical / Trunk Assessment: Normal  Communication   Communication: No difficulties  Cognition Arousal/Alertness: Awake/alert Behavior During Therapy: WFL for tasks assessed/performed Overall Cognitive Status: Within Functional Limits for tasks assessed                                        General Comments General comments (skin integrity, edema, etc.): sats dropped to 86 on 2 liters 02.  Pt does  not wear 02 at baseline    Exercises     Assessment/Plan    PT Assessment Patient needs continued PT services  PT Problem List Cardiopulmonary status limiting activity;Decreased mobility;Decreased activity tolerance       PT Treatment Interventions Gait training;Functional mobility training;Therapeutic activities    PT Goals (Current goals can be found in the Care Plan section)  Acute Rehab PT Goals Patient Stated Goal: return to independence PT Goal Formulation: With patient Time For Goal Achievement: 03/07/17 Potential to Achieve Goals: Good    Frequency Min 3X/week   Barriers to discharge        Co-evaluation PT/OT/SLP Co-Evaluation/Treatment: Yes Reason for Co-Treatment: For patient/therapist  safety PT goals addressed during session: Mobility/safety with mobility OT goals addressed during session: ADL's and self-care       AM-PAC PT "6 Clicks" Daily Activity  Outcome Measure Difficulty turning over in bed (including adjusting bedclothes, sheets and blankets)?: None Difficulty moving from lying on back to sitting on the side of the bed? : None Difficulty sitting down on and standing up from a chair with arms (e.g., wheelchair, bedside commode, etc,.)?: A Little Help needed moving to and from a bed to chair (including a wheelchair)?: A Little Help needed walking in hospital room?: A Little Help needed climbing 3-5 steps with a railing? : Total 6 Click Score: 18    End of Session Equipment Utilized During Treatment: Oxygen;Gait belt Activity Tolerance: Patient tolerated treatment well Patient left: in chair;with call bell/phone within reach;with chair alarm set;with family/visitor present Nurse Communication: Mobility status PT Visit Diagnosis: Difficulty in walking, not elsewhere classified (R26.2)    Time: 3007-6226 PT Time Calculation (min) (ACUTE ONLY): 27 min   Charges:   PT Evaluation $PT Eval Low Complexity: 1 Low     PT G CodesTresa Endo PT [phone]   Claretha Cooper 02/21/2017, 4:00 PM

## 2017-02-22 ENCOUNTER — Inpatient Hospital Stay (HOSPITAL_COMMUNITY): Payer: Federal, State, Local not specified - PPO

## 2017-02-22 DIAGNOSIS — R0600 Dyspnea, unspecified: Secondary | ICD-10-CM

## 2017-02-22 DIAGNOSIS — A4151 Sepsis due to Escherichia coli [E. coli]: Secondary | ICD-10-CM

## 2017-02-22 DIAGNOSIS — I5043 Acute on chronic combined systolic (congestive) and diastolic (congestive) heart failure: Secondary | ICD-10-CM

## 2017-02-22 LAB — BASIC METABOLIC PANEL
ANION GAP: 9 (ref 5–15)
BUN: 22 mg/dL — ABNORMAL HIGH (ref 6–20)
CO2: 23 mmol/L (ref 22–32)
Calcium: 8.1 mg/dL — ABNORMAL LOW (ref 8.9–10.3)
Chloride: 103 mmol/L (ref 101–111)
Creatinine, Ser: 1.76 mg/dL — ABNORMAL HIGH (ref 0.61–1.24)
GFR calc Af Amer: 43 mL/min — ABNORMAL LOW (ref >60)
GFR, EST NON AFRICAN AMERICAN: 37 mL/min — AB (ref >60)
Glucose, Bld: 132 mg/dL — ABNORMAL HIGH (ref 65–99)
POTASSIUM: 3.3 mmol/L — AB (ref 3.5–5.1)
SODIUM: 135 mmol/L (ref 135–145)

## 2017-02-22 LAB — CBC
HEMATOCRIT: 37.1 % — AB (ref 39.0–52.0)
HEMOGLOBIN: 12.9 g/dL — AB (ref 13.0–17.0)
MCH: 33.5 pg (ref 26.0–34.0)
MCHC: 34.8 g/dL (ref 30.0–36.0)
MCV: 96.4 fL (ref 78.0–100.0)
Platelets: 113 10*3/uL — ABNORMAL LOW (ref 150–400)
RBC: 3.85 MIL/uL — AB (ref 4.22–5.81)
RDW: 13.3 % (ref 11.5–15.5)
WBC: 8.7 10*3/uL (ref 4.0–10.5)

## 2017-02-22 LAB — GLUCOSE, CAPILLARY
GLUCOSE-CAPILLARY: 138 mg/dL — AB (ref 65–99)
GLUCOSE-CAPILLARY: 148 mg/dL — AB (ref 65–99)
GLUCOSE-CAPILLARY: 165 mg/dL — AB (ref 65–99)
Glucose-Capillary: 136 mg/dL — ABNORMAL HIGH (ref 65–99)

## 2017-02-22 LAB — PROCALCITONIN: PROCALCITONIN: 3.63 ng/mL

## 2017-02-22 LAB — BRAIN NATRIURETIC PEPTIDE: B Natriuretic Peptide: 355.6 pg/mL — ABNORMAL HIGH (ref 0.0–100.0)

## 2017-02-22 LAB — TROPONIN I: Troponin I: <0.03 ng/mL (ref <0.03)

## 2017-02-22 LAB — MAGNESIUM: Magnesium: 1.9 mg/dL (ref 1.7–2.4)

## 2017-02-22 LAB — ECHOCARDIOGRAM COMPLETE
HEIGHTINCHES: 71 in
WEIGHTICAEL: 3943.59 [oz_av]

## 2017-02-22 MED ORDER — ALPRAZOLAM 0.25 MG PO TABS
0.2500 mg | ORAL_TABLET | Freq: Once | ORAL | Status: AC
  Administered 2017-02-22: 0.25 mg via ORAL
  Filled 2017-02-22: qty 1

## 2017-02-22 MED ORDER — AMOXICILLIN-POT CLAVULANATE 875-125 MG PO TABS
1.0000 | ORAL_TABLET | Freq: Two times a day (BID) | ORAL | Status: DC
  Administered 2017-02-22 – 2017-02-25 (×7): 1 via ORAL
  Filled 2017-02-22 (×7): qty 1

## 2017-02-22 MED ORDER — FUROSEMIDE 40 MG PO TABS
40.0000 mg | ORAL_TABLET | Freq: Every day | ORAL | Status: DC
  Administered 2017-02-22: 40 mg via ORAL
  Filled 2017-02-22: qty 1

## 2017-02-22 MED ORDER — FUROSEMIDE 10 MG/ML IJ SOLN
40.0000 mg | Freq: Two times a day (BID) | INTRAMUSCULAR | Status: DC
  Administered 2017-02-22 – 2017-02-25 (×6): 40 mg via INTRAVENOUS
  Filled 2017-02-22 (×6): qty 4

## 2017-02-22 MED ORDER — POTASSIUM CHLORIDE CRYS ER 20 MEQ PO TBCR
40.0000 meq | EXTENDED_RELEASE_TABLET | Freq: Once | ORAL | Status: AC
  Administered 2017-02-22: 40 meq via ORAL
  Filled 2017-02-22: qty 2

## 2017-02-22 NOTE — Consult Note (Signed)
CARDIOLOGY CONSULT NOTE       Patient ID: KHALID LACKO MRN: [id number] DOB/AGE: Oct 18, 1945 71 y.o.  Admit date: 02/18/2017 Referring Physician: Grandville Silos Primary Physician: [name], MD Primary Cardiologist: Ellyn Hack  Reason for Consultation: CHF  Principal Problem:   Sepsis [facility]) Active Problems:   Hyperlipidemia   Obstructive sleep apnea   Type 2 diabetes mellitus ([place])   Stage T1c Adenocarcinoma of the Prostate with a Gleason's Score of 3+3 and a PSA of 6.63 - Favorable Risk   Atrial fibrillation, persistent ([place]):  CHA2DS2-VASc Score - On Eliquis   Hypokalemia   Tobacco abuse   Hypomagnesemia   GI bleed   Hypoxia   ARF (acute renal failure) (HCC)   HPI:   The patient is a 71 y.o. . year-old with hx of DM2, COPD / emphysema, gout, HL, HTN, chron afib, prostate Ca (2014) who presented to ED on 11/12 with fevers, diarrhea and urinary retention, hx of recent prostate biopsy 2 wks ago on 10/30.  Was having some rectal bleeding and then could not pass his urine, developed chills and fever at home with temp to 101.9.  He was admitted w/ dx of sepsis , started on IV vanc/ zosyn He had significant volume overload as part of his Rx sepsis. No has developed LE edema, dyspnea and signs of congestive heart failure Echo reviewed from today and EF 40-45% with moderate biatrial enlargement. [name] primarily follows him for PAF [facility] June 2017 and on eliqis and amiodarone to maintain NSR No history of CAD or CHF. No chest pain palpitations     ROS All other systems reviewed and negative except as noted above  Past Medical History:  Diagnosis Date  . Allergic rhinitis   . Anticoagulant long-term use    eliquis  . Bilateral hydrocele   . COPD with emphysema [facility])    pulmologist-  dr Halford Chessman  . Dyspnea    occasional per pt  . History of colon polyps    tubular adenoma 2013  . History of gout    last episode early June 2017-- (feet)  . Hyperlipidemia   .  Hypertension   . Nocturia    flomax has improved symptoms  . OSA on CPAP    per study 08-03-2004  Severe OSA  . Persistent atrial fibrillation [facility])    cardiologist --  paula ross--  post cardioversion 05-07-2014  . Prostate cancer [facility]) urologsit-  dr Alyson Ingles   Dx  2014--  stage T1c, Gleason 3+3=6, PSA 6.67--  Active surveillance  . 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 Seasonal allergies   . Sigmoid diverticulosis   . Tinea versicolor   . Type 2 diabetes mellitus ([place])   . Urgency of urination   . Wears glasses     Family History  Problem Relation Age of Onset  . Breast cancer Mother   . Stroke Unknown        Unknown   . Ovarian cancer Sister   . Colon cancer Neg Hx   . Esophageal cancer Neg Hx   . Rectal cancer Neg Hx   . Stomach cancer Neg Hx     Social History   Socioeconomic History  . Marital status: Married    Spouse name: Not on file  . Number of children: Not on file  . Years of education: Not on file  . Highest education level: Not on file  Social Needs  .  Financial resource strain: Not on file  . Food insecurity - worry: Not on file  . Food insecurity - inability: Not on file  . Transportation needs - medical: Not on file  . Transportation needs - non-medical: Not on file  Occupational History  . Occupation: Scientist, clinical (histocompatibility and immunogenetics): [employer]  Tobacco Use  . Smoking status: Current Every Day Smoker    Packs/day: 1.00    Years: 55.00    Pack years: 55.00    Types: Cigarettes  . Smokeless tobacco: Never Used  . Tobacco comment: trying to quit  mid Jan 2017 - as of 09-20-2015  down to , per pt 2-3 cigarettes per day  Substance and Sexual Activity  . Alcohol use: Yes    Comment: daily  . Drug use: No  . Sexual activity: Not on file  Other Topics Concern  . Not on file  Social History Narrative   Married 25 years   2 children but not with this wife    Past Surgical History:  Procedure  Laterality Date  . CARDIOVERSION N/A 09/09/2014   Performed by Lelon Perla, MD at [place]  . CARDIOVERSION N/A 05/07/2014   Performed by [name], MD at [facility] ENDOSCOPY  . COLONOSCOPY  last one 06-07-2011  . HYDROCELECTOMY ADULT Bilateral 09/26/2015   Performed by [name], MD at [facility]  . LAMINECTOMY AND MICRODISCECTOMY LUMBAR SPINE  12-23-2003   Left L5 -- S1  . LAPAROSCOPIC BILATERAL INGUINAL HERNIA REPAIR/  UMBILICAL HERNIA REPAIR WITH MESH/  ASPIRATION LEFT HYDROCELE  07-11-2012  . NM MYOVIEW LTD  05/18/2014   Low risk study. Normal perfusion: No ischemia or infarction. Mild LV dysfunction - 46% (does not correlate with echocardiographic EF of 50-55%)  . PROSTATE BIOPSY  05/15/12   Clinically both Lobes  . TONSILLECTOMY  as child  . TRANSESOPHAGEAL ECHOCARDIOGRAM (TEE) N/A 05/07/2014   Performed by [name], MD at [place]  . TRANSTHORACIC ECHOCARDIOGRAM  05/01/2014   mild LVH; EF 50-55% (cannot measure diastolic Fxn with Afib), Mod LA Dilation, mild MR     . amiodarone  200 mg Oral Daily  . amoxicillin-clavulanate  1 tablet Oral Q12H  . apixaban  5 mg Oral BID  . atorvastatin  10 mg Oral QPM  . azelastine  1 spray Each Nare BID  . diltiazem  360 mg Oral Daily  . furosemide  40 mg Intravenous Q12H  . hydrocortisone   Topical BID  . insulin aspart  0-9 Units Subcutaneous TID WC  . montelukast  10 mg Oral QHS  . nicotine  21 mg Transdermal Daily  . tamsulosin  0.4 mg Oral QPC supper     Physical Exam: BP 135/83 (BP Location: Left Arm)   Pulse 60   Temp 98.1 F (36.7 C) (Oral)   Resp 18   Ht 5\' 11"  (1.803 m)   Wt 246 lb 7.6 oz (111.8 kg)   SpO2 95%   BMI 34.38 kg/m  Affect appropriate Overweight white male HEENT: normal Neck supple with no adenopathy JVP normal no bruits no thyromegaly Lungs basilar crackles  no wheezing and good diaphragmatic motion Heart:  S1/S2 no murmur, no rub, gallop or click PMI  normal Abdomen: benighn, BS positve, no tenderness, no AAA no bruit.  No HSM or HJR Distal pulses intact with no bruits Plus one bilateral edema Neuro non-focal Skin warm and dry No muscular weakness   Labs:  Lab Results  Component Value Date   WBC 8.7 02/22/2017   HGB 12.9 (L) 02/22/2017   HCT 37.1 (L) 02/22/2017   MCV 96.4 02/22/2017   PLT 113 (L) 02/22/2017    Recent Labs  Lab 02/19/17 0623  02/22/17 0348  NA 133*   < > 135  K 2.8*   < > 3.3*  CL 101   < > 103  CO2 22   < > 23  BUN 13   < > 22*  CREATININE 1.31*   < > 1.76*  CALCIUM 7.4*   < > 8.1*  PROT 5.0*  --   --   BILITOT 1.2  --   --   ALKPHOS 86  --   --   ALT 37  --   --   AST 35  --   --   GLUCOSE 159*   < > 132*   < > = values in this interval not displayed.   Lab Results  Component Value Date   TROPONINI <0.03 02/19/2017    Lab Results  Component Value Date   CHOL 144 06/07/2016   CHOL 146 04/17/2013   CHOL 144 03/10/2012   Lab Results  Component Value Date   HDL 46 06/07/2016   HDL 45.80 04/17/2013   HDL 34.50 (L) 03/10/2012   Lab Results  Component Value Date   LDLCALC 62 06/07/2016   LDLCALC 84 04/17/2013   LDLCALC 57 05/15/2011   Lab Results  Component Value Date   TRIG 179 (H) 06/07/2016   TRIG 83.0 04/17/2013   TRIG 425.0 (H) 03/10/2012   Lab Results  Component Value Date   CHOLHDL 3.1 06/07/2016   CHOLHDL 3 04/17/2013   CHOLHDL 4 03/10/2012   Lab Results  Component Value Date   LDLDIRECT 57.8 03/10/2012   LDLDIRECT 49.9 07/19/2009      Radiology: Ct Abdomen Pelvis Wo Contrast  Result Date: 02/19/2017 CLINICAL DATA:  Fever, chills and gastrointestinal bleeding. Prostate biopsy 10 days ago. EXAM: CT ABDOMEN AND PELVIS WITHOUT CONTRAST TECHNIQUE: Multidetector CT imaging of the abdomen and pelvis was performed following the standard protocol without IV contrast. COMPARISON:  None. FINDINGS: Lower chest: Minimal basilar atelectasis. No significant chest finding.  Coronary artery calcification is noted. Hepatobiliary: Relative prominence of the left lobe suggest the possibility of early cirrhosis. This is not definite. No sign of focal liver lesion. No calcified gallstones. Question mild stranding of the fat around the gallbladder. Is there clinical concern about cholecystitis? Pancreas: Normal Spleen: Normal Adrenals/Urinary Tract: Mild adrenal hyperplasia. Possible 1 cm adenoma on the left. No worrisome adrenal finding. Kidneys are normal in size. There is some vascular calcification. No evidence of stone disease or focal lesion. No hydronephrosis. Bladder is thick-walled. Stomach/Bowel: Diverticulosis of the colon without evidence of diverticulitis. In the mid small bowel, there is a 7 or 8 cm segment of the small intestine which is not fill with contrast and could be abnormal due to enteritis or a mass lesion. Whereas this could simply P poor filling and contraction, in the setting of gastrointestinal bleeding, this is difficult to ignore. CT enterography could be employed to evaluate this segment of small intestine. Vascular/Lymphatic: Aortic atherosclerosis. No aneurysm. IVC is normal. No retroperitoneal adenopathy. Reproductive: Prostate gland is enlarged. No discernible focal lesion. No complication of biopsy detectable. Other: No free fluid or air. Musculoskeletal: Ordinary lower lumbar degenerative changes. IMPRESSION: No evidence of complication or relating to the recent prostate biopsy. Enlarged prostate in general.  7-8 cm segment of small bowel in the central portion which, in the setting of gastrointestinal bleeding, is questionably abnormal and could be secondary to inflammatory disease or mass lesion. CT enterography could better evaluate that segment. Some edema in the fat around the gallbladder. Is there clinical concern regarding any potential for cholecystitis? If so, ultrasound would be suggested. Prominence of the left lobe of the liver which could  suggest early cirrhosis. Aortic atherosclerosis. Electronically Signed   By: Nelson Chimes M.D.   On: 02/19/2017 14:59   Dg Chest 2 View  Result Date: 02/22/2017 CLINICAL DATA:  Shortness of breath EXAM: CHEST  2 VIEW COMPARISON:  02/19/2017, 04/12/2015 FINDINGS: Cardiomegaly with tiny pleural effusion. Increased vascular congestion and development of diffuse interstitial opacities suspicious for pulmonary edema. Aortic atherosclerosis. No pneumothorax. IMPRESSION: Cardiomegaly with development of small effusions, vascular congestion and diffuse interstitial pulmonary edema Electronically Signed   By: Donavan Foil M.D.   On: 02/22/2017 15:49   Dg Chest 2 View  Result Date: 02/19/2017 CLINICAL DATA:  71 year old male with fever and confusion. EXAM: CHEST  2 VIEW COMPARISON:  Chest radiograph dated 02/18/2017 FINDINGS: There is borderline cardiomegaly with vascular congestion similar to earlier radiograph. No focal consolidation, pleural effusion, or pneumothorax. Left lung base atelectatic changes noted. There is atherosclerotic calcification of the aortic arch. No acute osseous pathology. IMPRESSION: 1. No focal consolidation. 2. Cardiomegaly with mild vascular congestion. No significant interval change compared to the earlier radiograph. Electronically Signed   By: Anner Crete M.D.   On: 02/19/2017 02:46   Dg Chest 2 View  Result Date: 02/18/2017 CLINICAL DATA:  Fever and cough EXAM: CHEST  2 VIEW COMPARISON:  04/12/2015 FINDINGS: Scarring at the bilateral lung bases. No focal consolidation or pleural effusion. Mild cardiomegaly with slight central vascular congestion. Aortic atherosclerosis. No pneumothorax. Degenerative changes of the spine. IMPRESSION: 1. Scarring at the left lung base.  No acute pulmonary infiltrate 2. Borderline to mild cardiomegaly with mild central congestion Electronically Signed   By: Donavan Foil M.D.   On: 02/18/2017 23:32   US Renal  Result Date:  02/20/2017 CLINICAL DATA:  Acute renal failure. History of diabetes, prostate malignancy, and hypertension. EXAM: RENAL / URINARY TRACT ULTRASOUND COMPLETE COMPARISON:  Abdominal CT scan of February 19, 2017 FINDINGS: Right Kidney: Length: 14 cm. The renal cortical echotexture remains lower than that of the liver. There is no focal mass nor hydronephrosis. Left Kidney: Length: 13.7 cm. The renal cortical echotexture is similar to that on the right. There is no focal mass nor hydronephrosis. Bladder: The urinary bladder wall appears trabeculated. There is no discrete bladder wall mass. No echogenic debris or stones are observed. IMPRESSION: Normal appearance of both kidneys. Trabeculated appearance of the urinary bladder. Electronically Signed   By: David  Martinique M.D.   On: 02/20/2017 12:29    EKG: NSR rate 69 nonspecific ST changes   ASSESSMENT AND PLAN:  CHF:  Related to recent sepsis and volume overload. Diffuse hypokinesis and mild change in EF also likely from sepsis. He needs more diuresis and would follow renals advice He is not having an acute ischemic event.  PAF:  Maintaining NSR continue amiodarone and eliquis  CRF:  Suspect renal function will stay stable even with escalation in diuretic RX.  Lab Results  Component Value Date   CREATININE 1.76 (H) 02/22/2017   BUN 22 (H) 02/22/2017   NA 135 02/22/2017   K 3.3 (L) 02/22/2017   CL 103 02/22/2017  CO2 23 02/22/2017     Signed: Jenkins Rouge 02/22/2017, 6:40 PM

## 2017-02-22 NOTE — Progress Notes (Addendum)
PROGRESS NOTE    Ronald GAUTHREAUX  XTG:[id number] DOB: [birth year] DOA: 02/18/2017 PCP: [name], MD   Brief Narrative:   Ronald [name] a 71 y.o.malewithhistory of atrial fibrillation, hypertension, sleep apnea, prostate cancer presents to the ER with complaints of fever and chills and has passed blood in the stools. Patient had a prostate biopsy 10 days ago by [name]. Was doing fine since then. Yesterday afternoon around 2 PM patient had loose bowel stools which was bloody. Also passed some clots. Later in the evening around 3 PM he got stuck in the rain while driving. Again he had a bowel movement later around 5 PM which also was bloody. Patient started developing fever and chills. Denies any chest pain productive cough nausea vomiting abdominal pain. Patient started having persistent fever chills and came to the ER.  Is admitted with sepsis likely due to UTI with elevated pro-calcitonin, H&H is stable, he does have history of hemorrhoids as well.     Assessment & Plan:   Principal Problem:   Sepsis ([place]) Active Problems:   Hyperlipidemia   Obstructive sleep apnea   Type 2 diabetes mellitus (HCC)   Stage T1c Adenocarcinoma of the Prostate with a Gleason's Score of 3+3 and a PSA of 6.63 - Favorable Risk   Atrial fibrillation, persistent ([place]):  CHA2DS2-VASc Score - On Eliquis   Hypokalemia   Tobacco abuse   Hypomagnesemia   GI bleed   Hypoxia   ARF (acute renal failure) (HCC)  #1 sepsis Questionable etiology.  Thought likely secondary to UTI as patient with recent prostate biopsy done approximately 3-4 weeks ago.  Pro-calcitonin and lactic acid levels were elevated and also concern for bacteremia.  Pro-calcitonin and lactic acid levels trending down.  Blood cultures pending with no growth to date.  Urine cultures negative. Fever curve trending down.  Leukocytosis trending down.  Patient currently afebrile.  Lactic acid and pro calcitonin levels trending  down. CT abdomen and pelvis with no evidence of complication or relating to recent prostate biopsy.  Enlarged prostate in general.  7-8 cm segment of small bowel in the central portion which in the setting of GI bleed questionable abnormal and could be secondary to inflammatory disease or mass lesion.  CT enterography could better evaluate that segment.  Some edema and fat around the gallbladder.  Prominent left lobe of liver which could suggest early cirrhosis.  Patient improving clinically.  Fever curve trending down.  Leukocytosis has trended down.  Discontinued IV vancomycin for now as MRSA PCR is negative.  Discontinue IV Zosyn.  Place on Augmentin to complete course of antibiotic treatment.  Follow.  #2 ??  GI bleed/2 episodes of blood mixed with stool Patient with history of hemorrhoids and diverticulosis and colonic polyps status post removal per colonoscopy of 09/05/2016 showing tubular adenoma, hyperplastic polyps, no high-grade dysplasia or malignancy.  Patient with no further bloody bowel movements.  H&H has remained stable. CT abdomen and pelvis with no evidence of complication or relating to recent prostate biopsy.  Enlarged prostate in general.  7-8 cm segment of small bowel in the central portion which in the setting of GI bleed questionable abnormal and could be secondary to inflammatory disease or mass lesion.  CT enterography could better evaluate that segment.  Some edema and fat around the gallbladder.  Prominent left lobe of liver which could suggest early cirrhosis.  Unable to get CT enterography due to patient's worsening renal function.  Patient however improving clinically.  Renal function slowly starting to trend down.  Patient will likely need to follow-up in the outpatient setting with his gastroenterologist, [facility].    3.  Paroxysmal atrial fibrillation CHA2DS2VASC at least 3.  Currently in normal sinus rhythm.  Overnight patient noted to going to A. fib with heart rates in the  120s.  Cardizem was resumed in addition to patient's amiodarone.  Heart rate improved and now controlled in the 70s and patient back in normal sinus rhythm. Eliquis was held secondary to #2 initially on admission.  Patient with no further bleeding.  Hemoglobin stable.  Eliquis resumed on 02/21/2017.  Follow.   4.  Obstructive sleep apnea CPAP nightly.  5.  Diabetes mellitus type 2 Hemoglobin A1c was 6.7 on December 05, 2016.  CBGs have ranged from 138-226.  Continue sliding scale insulin.  Continue to hold oral hypoglycemic agents.  6.  Hypotension/HTN Blood pressure improved with IV fluids.  ACE inhibitor on hold due to worsening renal function and also hypotension on admission.  Blood pressure now elevated.  Patient's Flomax were resumed 02/20/2017.  Patient's Cardizem resumed overnight.  Follow for now.  If further blood pressure control is needed may need to add Norvasc.  Follow for now.    7.  Acute renal failure Likely secondary to a prerenal azotemia as patient was noted to initially be hypotensive on admission with sepsis.  Pain had 51 cc of urine in 02/20/2017.  Urinalysis with small leukocytes, nitrite negative, 6-30 WBCs.  Renal function with some improvement with hydration, patient now noted to have some volume overload and renal function had worsened.  Creatinine currently at 1.76 with no significant improvement over the past 24-48 hours.  Patient received Lasix 40 mg IV every 12 hours with a urine output of 3.8 L.  Patient with some lower extremity edema.  Flomax resumed.  ACE inhibitor on hold.  Patient may need further diuresis with IV Lasix, however will consult with nephrology for further evaluation and management.  8.  History of BPH/prostate cancer Status post prostate biopsy approximately 3 weeks ago.  No hematuria.  Patient with history of urinary retention.  Bladder scan with only 51 cc noted, 02/20/2017.  Patient with good urine output.  Outpatient follow-up with urology.  9.   History of COPD with ongoing tobacco abuse Tobacco cessation.  Stable. Patient states has outpatient appointment with pulmonologist early next week.  10. Hypokalemia/Hypomagnesemia Likely secondary to diuresis.  Replete.   #11 hypoxia/volume overload Patient with sats of 87-91% on nasal cannula.  Some clinical improvement after IV Lasix yesterday, 02/21/2017.  hypoxia likely secondary to volume overload secondary to aggressive hydration on presentation.  Patient with 1-2+ bilateral lower extremity edema.  Patient was +6.6 L during this hospitalization.  Patient on at a minimum Lasix 40 mg orally daily prior to admission which was discontinued on admission secondary to hypotension.  Patient's current weight of 246 pounds from 245 pounds from 238 pounds on admission.  Patient received Lasix 40 mg IV every 12 hours x2 doses yesterday 02/21/2017 with a urine output of 3.8 L.  Patient is still +2.8 L.  Renal function with no significant improvement.  Repeat chest x-ray.  Check a 2D echo.  May need IV Lasix 40 mg IV every 12 hours however will wait till patient has been assessed by nephrology.    DVT prophylaxis: eliquis Code Status: Full Family Communication: Updated patient and wife at bedside. Disposition Plan: Transfer to telemetry.    Consultants:  Urology: [name] 02/19/2017  Nephrology: Dr.Shertz pending  Procedures:  CT abdomen and pelvis 02/19/2017  Chest x-ray 02/19/2017    Antimicrobials:   IV Zosyn 02/19/2017>>>>>>> 02/22/2017  IV vancomycin 02/19/2017>>>>> 02/20/2017  Oral Augmentin 02/22/2017   Subjective: Patient sitting up in chair.  Patient states improvement with shortness of breath from yesterday.  Patient states significant urine output over the past 24 hours.  Patient denies any chest pain.  No further bloody bowel movements.   Objective: Vitals:   02/22/17 0401 02/22/17 0600 02/22/17 0800 02/22/17 0832  BP:  (!) 147/89 (!) 151/88   Pulse:  70  72    Resp:  (!) 26  (!) 27  Temp: 99 F (37.2 C)  98.6 F (37 C)   TempSrc: Axillary  Oral   SpO2:  91%  93%  Weight: 111.8 kg (246 lb 7.6 oz)     Height:        Intake/Output Summary (Last 24 hours) at 02/22/2017 0942 Last data filed at 02/22/2017 0834 Gross per 24 hour  Intake 350 ml  Output 3650 ml  Net -3300 ml   Filed Weights   02/20/17 1840 02/21/17 0500 02/22/17 0401  Weight: 113.3 kg (249 lb 12.5 oz) 111.5 kg (245 lb 13 oz) 111.8 kg (246 lb 7.6 oz)    Examination:  General exam: NAD  Respiratory system: Bibasilar crackles.  No wheezing.  Respiratory effort normal. Cardiovascular system: S1 & S2 heard, RRR. + JVD. No murmurs, rubs, gallops or clicks.  2+ bilateral lower extremity edema.  Gastrointestinal system: Abdomen is mildly distended, soft, nontender to palpation.  No organomegaly or masses felt. Normal bowel sounds heard. Central nervous system: Alert and oriented. No focal neurological deficits. Extremities: Symmetric 5 x 5 power. Skin: No rashes, lesions or ulcers Psychiatry: Judgement and insight appear normal. Mood & affect appropriate.     Data Reviewed: I have personally reviewed following labs and imaging studies  CBC: Recent Labs  Lab 02/18/17 2251 02/19/17 0623 02/19/17 1349 02/19/17 2018 02/20/17 0838 02/21/17 0340 02/22/17 0348  WBC 13.5* 10.6*  --   --  16.0* 12.1* 8.7  NEUTROABS 12.0* 10.0*  --   --  14.1* 10.5*  --   HGB 15.9 13.9 13.5 13.4 13.5 12.9* 12.9*  HCT 45.7 40.2 39.3 39.4 39.5 37.6* 37.1*  MCV 96.2 96.9  --   --  97.1 97.7 96.4  PLT 197 144*  --   --  115* 105* 053*   Basic Metabolic Panel: Recent Labs  Lab 02/18/17 2251 02/19/17 0623 02/20/17 0323 02/20/17 0838 02/21/17 0340 02/22/17 0348  NA 131* 133*  --  134* 135 135  K 3.5 2.8*  --  4.4 3.9 3.3*  CL 98* 101  --  106 108 103  CO2 22 22  --  20* 18* 23  GLUCOSE 152* 159*  --  137* 129* 132*  BUN 13 13  --  21* 21* 22*  CREATININE 1.00 1.31* 1.89* 1.87* 1.73*  1.76*  CALCIUM 8.8* 7.4*  --  7.6* 7.5* 8.1*  MG  --   --  1.4*  --  2.2 1.9   GFR: Estimated Creatinine Clearance: 49 mL/min (A) (by C-G formula based on SCr of 1.76 mg/dL (H)). Liver Function Tests: Recent Labs  Lab 02/18/17 2251 02/19/17 0623  AST 31 35  ALT 43 37  ALKPHOS 102 86  BILITOT 0.9 1.2  PROT 6.7 5.0*  ALBUMIN 4.2 2.8*   No results for input(s): LIPASE,  AMYLASE in the last 168 hours. No results for input(s): AMMONIA in the last 168 hours. Coagulation Profile: Recent Labs  Lab 02/19/17 0623  INR 1.30   Cardiac Enzymes: Recent Labs  Lab 02/19/17 0623  TROPONINI <0.03   BNP (last 3 results) No results for input(s): PROBNP in the last 8760 hours. HbA1C: No results for input(s): HGBA1C in the last 72 hours. CBG: Recent Labs  Lab 02/21/17 0805 02/21/17 1159 02/21/17 1616 02/21/17 1943 02/22/17 0737  GLUCAP 114* 118* 108* 226* 138*   Lipid Profile: No results for input(s): CHOL, HDL, LDLCALC, TRIG, CHOLHDL, LDLDIRECT in the last 72 hours. Thyroid Function Tests: No results for input(s): TSH, T4TOTAL, FREET4, T3FREE, THYROIDAB in the last 72 hours. Anemia Panel: No results for input(s): VITAMINB12, FOLATE, FERRITIN, TIBC, IRON, RETICCTPCT in the last 72 hours. Sepsis Labs: Recent Labs  Lab 02/19/17 0309 02/19/17 0623 02/19/17 0815 02/20/17 0838 02/21/17 0340 02/22/17 0348  PROCALCITON  --  14.60  --  19.28 6.29 3.63  LATICACIDVEN 4.62* 2.5* 2.2* 1.2  --   --     Recent Results (from the past 240 hour(s))  Culture, blood (Routine x 2)     Status: None (Preliminary result)   Collection Time: 02/18/17 11:10 PM  Result Value Ref Range Status   Specimen Description BLOOD BLOOD RIGHT ARM  Final   Special Requests   Final    BOTTLES DRAWN AEROBIC AND ANAEROBIC Blood Culture adequate volume   Culture   Final    NO GROWTH 2 DAYS Performed at [facility], [place] [address] [place], [place] [address]    Report Status PENDING  Incomplete    Culture, blood (Routine x 2)     Status: None (Preliminary result)   Collection Time: 02/18/17 11:10 PM  Result Value Ref Range Status   Specimen Description BLOOD BLOOD LEFT ARM  Final   Special Requests   Final    BOTTLES DRAWN AEROBIC AND ANAEROBIC Blood Culture adequate volume   Culture   Final    NO GROWTH 2 DAYS Performed at [facility], [place] [address], [place], [place] [address]    Report Status PENDING  Incomplete  MRSA PCR Screening     Status: None   Collection Time: 02/19/17  5:25 AM  Result Value Ref Range Status   MRSA by PCR NEGATIVE NEGATIVE Final    Comment:        The GeneXpert MRSA Assay (FDA approved for NASAL specimens only), is one component of a comprehensive MRSA colonization surveillance program. It is not intended to diagnose MRSA infection nor to guide or monitor treatment for MRSA infections.   Culture, Urine     Status: None   Collection Time: 02/20/17 10:11 AM  Result Value Ref Range Status   Specimen Description URINE, CLEAN CATCH  Final   Special Requests NONE  Final   Culture   Final    NO GROWTH Performed at [facility], [address], [place], [place] [address]    Report Status 02/21/2017 FINAL  Final         Radiology Studies: US Renal  Result Date: 02/20/2017 CLINICAL DATA:  Acute renal failure. History of diabetes, prostate malignancy, and hypertension. EXAM: RENAL / URINARY TRACT ULTRASOUND COMPLETE COMPARISON:  Abdominal CT scan of February 19, 2017 FINDINGS: Right Kidney: Length: 14 cm. The renal cortical echotexture remains lower than that of the liver. There is no focal mass nor hydronephrosis. Left Kidney: Length:  13.7 cm. The renal cortical echotexture is similar to that on the right. There is no focal mass nor hydronephrosis. Bladder: The urinary bladder wall appears trabeculated. There is no discrete bladder wall mass. No echogenic debris or stones are observed. IMPRESSION: Normal appearance of both kidneys.  Trabeculated appearance of the urinary bladder. Electronically Signed   By: David  Martinique M.D.   On: 02/20/2017 12:29        Scheduled Meds: . amiodarone  200 mg Oral Daily  . amoxicillin-clavulanate  1 tablet Oral Q12H  . apixaban  5 mg Oral BID  . atorvastatin  10 mg Oral QPM  . azelastine  1 spray Each Nare BID  . diltiazem  360 mg Oral Daily  . furosemide  40 mg Oral Daily  . hydrocortisone   Topical BID  . insulin aspart  0-9 Units Subcutaneous TID WC  . montelukast  10 mg Oral QHS  . nicotine  21 mg Transdermal Daily  . tamsulosin  0.4 mg Oral QPC supper   Continuous Infusions:    LOS: 3 days    Time spent: 40 minutes    Irine Seal, MD [facility] Pager [phone]  If 7PM-7AM, please contact night-coverage [email or web address] Password [facility] 02/22/2017, 9:42 AM

## 2017-02-22 NOTE — Progress Notes (Signed)
Pt. able to place CPAP on his own and has filled humidifier, wife plans to remain at bedside, RT placed double flowmeter to wall for n/c and CPAP, made aware to notify if needed.

## 2017-02-22 NOTE — Consult Note (Signed)
Renal Service Consult Note [place] 02/22/2017 [place] Blazing Requesting Physician:  Dr Grandville Silos  Reason for Consult:  AKI HPI: The patient is a 71 y.o. year-old with hx of DM2, COPD / emphysema, gout, HL, HTN, chron afib, prostate Ca (2014) who presented to ED on 11/12 with fevers, diarrhea and urinary retention, hx of recent prostate biopsy 2 wks ago on 10/30.  Was having some rectal bleeding and then could not pass his urine, developed chills and fever at home with temp to 101.9.  He was admitted w/ dx of sepsis , started on IV vanc/ zosyn, cx's were sent.  CXR showed vasc congestion, no infiltrates. Creat on admit was 1.0, then went to 1.3 and 1.8.  Felt to possibly be dry, given IVF"s.  Then became SOB w/ LE edema, repeat CXR showed CHF and was given lasix IV 40 mg yest x 2.  Creat has remained up at 1.7- 1.8 for last 3 days.  Asked to see for AKI.    Had a few low BP's in first 24 hrs here, none since, BP's normal to high side now.  No nsaids, no ACEi, no contrast here.  Renal US no hydro, 2 normal kidneys.    Had a bad night on Mon/ Tues, feeling better yest and today. Still some DOE, "my oxygen levels aren't staying up", on nasal O2.  No cough or CP.  No abd pain. Has had LE edema for years, takes 40 mg once or twice a day. No prior formal dx of CHF.      Home meds: -amiodarone 200/ eliquis 5 bid/ lipitor -cardizem CD 360/ lasix 40- 80 qd prn/ lisinopril 5 mg qd -metformin/ lantus insulin -flomax/ singulair/ colchicine prn/ albulterol prn     ROS  denies CP  no joint pain   no HA  no blurry vision  no rash  no diarrhea  no nausea/ vomiting  no dysuria  no difficulty voiding  no change in urine color    Past Medical History  Past Medical History:  Diagnosis Date  . Allergic rhinitis   . Anticoagulant long-term use    eliquis  . Bilateral hydrocele   . COPD with emphysema [facility])    pulmologist-  dr Halford Chessman  . Dyspnea     occasional per pt  . History of colon polyps    tubular adenoma 2013  . History of gout    last episode early June 2017-- (feet)  . Hyperlipidemia   . Hypertension   . Nocturia    flomax has improved symptoms  . OSA on CPAP    per study 08-03-2004  Severe OSA  . Persistent atrial fibrillation [facility])    cardiologist --  paula ross--  post cardioversion 05-07-2014  . Prostate cancer [facility]) urologsit-  dr Alyson Ingles   Dx  2014--  stage T1c, Gleason 3+3=6, PSA 6.67--  Active surveillance  . 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 Seasonal allergies   . Sigmoid diverticulosis   . Tinea versicolor   . Type 2 diabetes mellitus ([place])   . Urgency of urination   . Wears glasses    Past Surgical History  Past Surgical History:  Procedure Laterality Date  . CARDIOVERSION N/A 09/09/2014   Performed by Lelon Perla, MD at [facility]  . CARDIOVERSION N/A 05/07/2014   Performed by [name], MD at [facility] ENDOSCOPY  . COLONOSCOPY  last  one 06-07-2011  . HYDROCELECTOMY ADULT Bilateral 09/26/2015   Performed by [name], MD at [facility]  . LAMINECTOMY AND MICRODISCECTOMY LUMBAR SPINE  12-23-2003   Left L5 -- S1  . LAPAROSCOPIC BILATERAL INGUINAL HERNIA REPAIR/  UMBILICAL HERNIA REPAIR WITH MESH/  ASPIRATION LEFT HYDROCELE  07-11-2012  . NM MYOVIEW LTD  05/18/2014   Low risk study. Normal perfusion: No ischemia or infarction. Mild LV dysfunction - 46% (does not correlate with echocardiographic EF of 50-55%)  . PROSTATE BIOPSY  05/15/12   Clinically both Lobes  . TONSILLECTOMY  as child  . TRANSESOPHAGEAL ECHOCARDIOGRAM (TEE) N/A 05/07/2014   Performed by [name], MD at [facility]  . TRANSTHORACIC ECHOCARDIOGRAM  05/01/2014   mild LVH; EF 50-55% (cannot measure diastolic Fxn with Afib), Mod LA Dilation, mild MR   Family History  Family History  Problem Relation Age of Onset  . Breast cancer Mother    . Stroke Unknown        Unknown   . Ovarian cancer Sister   . Colon cancer Neg Hx   . Esophageal cancer Neg Hx   . Rectal cancer Neg Hx   . Stomach cancer Neg Hx    Social History  reports that he has been smoking cigarettes.  He has a 55.00 pack-year smoking history. he has never used smokeless tobacco. He reports that he drinks alcohol. He reports that he does not use drugs. Allergies No Known Allergies Home medications Prior to Admission medications   Medication Sig Start Date End Date Taking? Authorizing Provider  acetaminophen (TYLENOL) 500 MG tablet Take 500 mg by mouth every 6 (six) hours as needed for mild pain or moderate pain.   Yes [provider]  albuterol (VENTOLIN HFA) 108 (90 Base) MCG/ACT inhaler Inhale 2 puffs into the lungs every 6 (six) hours as needed for wheezing or shortness of breath. 12/22/15  Yes Chesley Mires, MD  amiodarone (PACERONE) 200 MG tablet TAKE 1 TABLET (200 MG TOTAL) BY MOUTH DAILY. 10/02/16  Yes Leonie Man, MD  apixaban (ELIQUIS) 5 MG TABS tablet Take 5 mg by mouth 2 (two) times daily.   Yes [provider]  atorvastatin (LIPITOR) 10 MG tablet TAKE 1 TABLET (10 MG TOTAL) BY MOUTH EVERY EVENING. 01/21/17  Yes Burchette, Alinda Sierras, MD  azelastine (ASTELIN) 0.1 % nasal spray Place 1 spray into both nostrils 2 (two) times daily. Use in each nostril as directed 12/22/15  Yes Chesley Mires, MD  colchicine 0.6 MG tablet Two tabs at onset of gout flare and then one tab twice daily as needed 01/30/17  Yes Burchette, Alinda Sierras, MD  diltiazem (CARDIZEM CD) 360 MG 24 hr capsule Take 1 capsule (360 mg total) by mouth daily. 03/14/16  Yes Burchette, Alinda Sierras, MD  fluticasone (FLONASE) 50 MCG/ACT nasal spray Place 2 sprays into both nostrils daily as needed for allergies or rhinitis. 05/08/14  Yes Hongalgi, Lenis Dickinson, MD  furosemide (LASIX) 40 MG tablet TAKE 1-2 TABLETS DAILY AS NEEDED. Patient taking differently: TAKE 1-2 TABLETS DAILY AS NEEDED FlUID  02/11/17  Yes Burchette, Alinda Sierras, MD  lisinopril (PRINIVIL,ZESTRIL) 5 MG tablet TAKE 1 TABLET BY MOUTH DAILY 12/03/16  Yes Leonie Man, MD  metFORMIN (GLUCOPHAGE) 500 MG tablet TAKE 1 TABLET (500 MG TOTAL) BY MOUTH 2 (TWO) TIMES DAILY WITH A MEAL. 12/07/16  Yes Burchette, Alinda Sierras, MD  montelukast (SINGULAIR) 10 MG tablet Take 1 tablet (10 mg total) by mouth  at bedtime. 01/29/17  Yes Burchette, Alinda Sierras, MD  tamsulosin (FLOMAX) 0.4 MG CAPS capsule Take 0.4 mg by mouth daily after supper.  02/14/15  Yes [provider]  ACCU-CHEK AVIVA PLUS test strip CHECK BLOOD SUGARS ONCE PER DAY. DX: E11.9 07/05/16   Burchette, Alinda Sierras, MD  Lancets (ACCU-CHEK SOFT TOUCH) lancets Check blood sugars once per day. DX: E11.9 10/24/15   [name], MD   Liver Function Tests Recent Labs  Lab 02/18/17 2251 02/19/17 0623  AST 31 35  ALT 43 37  ALKPHOS 102 86  BILITOT 0.9 1.2  PROT 6.7 5.0*  ALBUMIN 4.2 2.8*   No results for input(s): LIPASE, AMYLASE in the last 168 hours. CBC Recent Labs  Lab 02/19/17 0623  02/20/17 0838 02/21/17 0340 02/22/17 0348  WBC 10.6*  --  16.0* 12.1* 8.7  NEUTROABS 10.0*  --  14.1* 10.5*  --   HGB 13.9   < > 13.5 12.9* 12.9*  HCT 40.2   < > 39.5 37.6* 37.1*  MCV 96.9  --  97.1 97.7 96.4  PLT 144*  --  115* 105* 113*   < > = values in this interval not displayed.   Basic Metabolic Panel Recent Labs  Lab 02/18/17 2251 02/19/17 0623 02/20/17 0323 02/20/17 0838 02/21/17 0340 02/22/17 0348  NA 131* 133*  --  134* 135 135  K 3.5 2.8*  --  4.4 3.9 3.3*  CL 98* 101  --  106 108 103  CO2 22 22  --  20* 18* 23  GLUCOSE 152* 159*  --  137* 129* 132*  BUN 13 13  --  21* 21* 22*  CREATININE 1.00 1.31* 1.89* 1.87* 1.73* 1.76*  CALCIUM 8.8* 7.4*  --  7.6* 7.5* 8.1*   Iron/TIBC/Ferritin/ %Sat No results found for: IRON, TIBC, FERRITIN, IRONPCTSAT  Vitals:   02/22/17 0401 02/22/17 0600 02/22/17 0800 02/22/17 0832  BP:  (!) 147/89 (!) 151/88   Pulse:  70   72  Resp:  (!) 26  (!) 27  Temp: 99 F (37.2 C)  98.6 F (37 C)   TempSrc: Axillary  Oral   SpO2:  91%  93%  Weight: 111.8 kg (246 lb 7.6 oz)     Height:       Exam Gen alert, nasal O2, up in chair No rash, cyanosis or gangrene Sclera anicteric, throat clear  +JVD to angle of jaw Chest soft rales bilat bases 1/3 up, no wheezing RRR no MRG, no lifts or heaves, PMI normal Abd soft ntnd no mass or ascites +bs GU normal male MS no joint effusions or deformity Ext has pitting bilat pretib 1-2+ edema / no wounds or ulcers Neuro is alert, Ox 3 , nf   CXR - 11/14 , looks like vasc congestion and early edema Renal US - no hydro, 12- 14 cm kidneys UA =  6-30 rbc, 6-30 wbc, protein negative UNa 25, UCr 49 on 11/14   Impression: 1. Acute renal insufficiency - suspect this is CHF related, decomp CHF.  No evidence GN, no cause for ATN. Vanc levels aren't high.  Still wet, need further diuresis, ECHO, etc., have d/w primary MD.  Vanc dc'd already. BP's good. 2. Pyuria/ hematuria - on admit, w/ fevers acutely 2 wks after prostate biopsy with some bleeding complications at home, suspect fevers due to UTI and/ or prostate infection.  3. Fevers - as above 4. COPD  5. Hx afib 6. DM on insulin  Plan - as above  Kelly Splinter MD [facility] pager [phone]   02/22/2017, 9:49 AM

## 2017-02-22 NOTE — Progress Notes (Signed)
Date: February 22, 2017 Velva Harman, BSN, [place], [place] Chart and notes review for patient progress and needs./sepsis with temp still 100.9/ Will follow for case management and discharge needs. Next review date: [id number]

## 2017-02-22 NOTE — Plan of Care (Signed)
  Health Behavior/Discharge Planning: Ability to manage health-related needs will improve 02/22/2017 2358 - Progressing by Ashley Murrain, RN   Clinical Measurements: Ability to maintain clinical measurements within normal limits will improve 02/22/2017 2358 - Progressing by Huie Ghuman, Sherryll Burger, RN

## 2017-02-22 NOTE — Progress Notes (Signed)
  Echocardiogram 2D Echocardiogram has been performed.  Ronald [name] 02/22/2017, 2:31 PM

## 2017-02-22 NOTE — Progress Notes (Signed)
PT Cancellation Note  Patient Details Name: DANELL VERNO MRN: [id number] DOB: December 28, 1945  Cancelled Treatment:   Attempted to see patient twice today. Canceled this morning due to echo, getting chest x-ray in the afternoon and then being moved to telemetry. Patient has been evaluated by PT and recommends no follow up or equipment.Almond Lint, SPTA [facility] [place]  PTA [facility]  Acute  Rehab Pager      [phone]

## 2017-02-23 DIAGNOSIS — I5021 Acute systolic (congestive) heart failure: Secondary | ICD-10-CM

## 2017-02-23 DIAGNOSIS — I5032 Chronic diastolic (congestive) heart failure: Secondary | ICD-10-CM

## 2017-02-23 DIAGNOSIS — R6520 Severe sepsis without septic shock: Secondary | ICD-10-CM

## 2017-02-23 DIAGNOSIS — I5043 Acute on chronic combined systolic (congestive) and diastolic (congestive) heart failure: Secondary | ICD-10-CM

## 2017-02-23 DIAGNOSIS — E8770 Fluid overload, unspecified: Secondary | ICD-10-CM

## 2017-02-23 LAB — CBC WITH DIFFERENTIAL/PLATELET
BASOS ABS: 0 10*3/uL (ref 0.0–0.1)
BASOS PCT: 1 %
EOS ABS: 0.1 10*3/uL (ref 0.0–0.7)
Eosinophils Relative: 2 %
HCT: 39 % (ref 39.0–52.0)
HEMOGLOBIN: 13.4 g/dL (ref 13.0–17.0)
Lymphocytes Relative: 16 %
Lymphs Abs: 1.1 10*3/uL (ref 0.7–4.0)
MCH: 33.2 pg (ref 26.0–34.0)
MCHC: 34.4 g/dL (ref 30.0–36.0)
MCV: 96.5 fL (ref 78.0–100.0)
Monocytes Absolute: 1 10*3/uL (ref 0.1–1.0)
Monocytes Relative: 14 %
NEUTROS PCT: 67 %
Neutro Abs: 4.7 10*3/uL (ref 1.7–7.7)
Platelets: 147 10*3/uL — ABNORMAL LOW (ref 150–400)
RBC: 4.04 MIL/uL — AB (ref 4.22–5.81)
RDW: 13.1 % (ref 11.5–15.5)
WBC: 6.9 10*3/uL (ref 4.0–10.5)

## 2017-02-23 LAB — GLUCOSE, CAPILLARY
GLUCOSE-CAPILLARY: 131 mg/dL — AB (ref 65–99)
GLUCOSE-CAPILLARY: 170 mg/dL — AB (ref 65–99)
Glucose-Capillary: 110 mg/dL — ABNORMAL HIGH (ref 65–99)
Glucose-Capillary: 102 mg/dL — ABNORMAL HIGH (ref 65–99)

## 2017-02-23 LAB — BASIC METABOLIC PANEL
ANION GAP: 8 (ref 5–15)
BUN: 22 mg/dL — ABNORMAL HIGH (ref 6–20)
CHLORIDE: 104 mmol/L (ref 101–111)
CO2: 24 mmol/L (ref 22–32)
Calcium: 8.3 mg/dL — ABNORMAL LOW (ref 8.9–10.3)
Creatinine, Ser: 1.63 mg/dL — ABNORMAL HIGH (ref 0.61–1.24)
GFR calc non Af Amer: 41 mL/min — ABNORMAL LOW (ref >60)
GFR, EST AFRICAN AMERICAN: 47 mL/min — AB (ref >60)
Glucose, Bld: 128 mg/dL — ABNORMAL HIGH (ref 65–99)
POTASSIUM: 3.5 mmol/L (ref 3.5–5.1)
SODIUM: 136 mmol/L (ref 135–145)

## 2017-02-23 LAB — TROPONIN I
Troponin I: <0.03 ng/mL (ref <0.03)
Troponin I: <0.03 ng/mL (ref <0.03)

## 2017-02-23 LAB — MAGNESIUM: Magnesium: 2 mg/dL (ref 1.7–2.4)

## 2017-02-23 MED ORDER — CARVEDILOL 6.25 MG PO TABS
6.2500 mg | ORAL_TABLET | Freq: Two times a day (BID) | ORAL | Status: DC
Start: 2017-02-23 — End: 2017-02-25
  Administered 2017-02-23 – 2017-02-25 (×5): 6.25 mg via ORAL
  Filled 2017-02-23 (×4): qty 1

## 2017-02-23 MED ORDER — POTASSIUM CHLORIDE CRYS ER 20 MEQ PO TBCR
40.0000 meq | EXTENDED_RELEASE_TABLET | Freq: Every day | ORAL | Status: DC
  Administered 2017-02-23 – 2017-02-25 (×3): 40 meq via ORAL
  Filled 2017-02-23 (×3): qty 2

## 2017-02-23 MED ORDER — ALPRAZOLAM 0.25 MG PO TABS
0.2500 mg | ORAL_TABLET | Freq: Every evening | ORAL | Status: DC | PRN
  Administered 2017-02-23 – 2017-02-24 (×2): 0.25 mg via ORAL
  Filled 2017-02-23 (×2): qty 1

## 2017-02-23 NOTE — Progress Notes (Signed)
PROGRESS NOTE    Ronald [name]  GUY:[id number] DOB: 1945/10/08 DOA: 02/18/2017 PCP: [name], MD   Brief Narrative:   [name] a 71 y.o.malewithhistory of atrial fibrillation, hypertension, sleep apnea, prostate cancer presents to the ER with complaints of fever and chills and has passed blood in the stools. Patient had a prostate biopsy 10 days ago by [name]. Was doing fine since then. Yesterday afternoon around 2 PM patient had loose bowel stools which was bloody. Also passed some clots. Later in the evening around 3 PM he got stuck in the rain while driving. Again he had a bowel movement later around 5 PM which also was bloody. Patient started developing fever and chills. Denies any chest pain productive cough nausea vomiting abdominal pain. Patient started having persistent fever chills and came to the ER.  Is admitted with sepsis likely due to UTI with elevated pro-calcitonin, H&H is stable, he does have history of hemorrhoids as well.     Assessment & Plan:   Principal Problem:   Sepsis ([place]) Active Problems:   Hyperlipidemia   Obstructive sleep apnea   Type 2 diabetes mellitus (HCC)   Stage T1c Adenocarcinoma of the Prostate with a Gleason's Score of 3+3 and a PSA of 6.63 - Favorable Risk   Atrial fibrillation, persistent ([place]):  CHA2DS2-VASc Score - On Eliquis   Hypokalemia   Tobacco abuse   Hypomagnesemia   GI bleed   Hypoxia   ARF (acute renal failure) (HCC)  #1 sepsis Questionable etiology.  Thought likely secondary to UTI as patient with recent prostate biopsy done approximately 3-4 weeks ago.  Pro-calcitonin and lactic acid levels were elevated and also concern for bacteremia.  Pro-calcitonin and lactic acid levels trending down.  Blood cultures pending with no growth to date.  Urine cultures negative. Fever curve trending down.  Leukocytosis trending down.  Patient currently afebrile.  Lactic acid and pro calcitonin levels trending  down. CT abdomen and pelvis with no evidence of complication or relating to recent prostate biopsy.  Enlarged prostate in general.  7-8 cm segment of small bowel in the central portion which in the setting of GI bleed questionable abnormal and could be secondary to inflammatory disease or mass lesion.  CT enterography could better evaluate that segment.  Some edema and fat around the gallbladder.  Prominent left lobe of liver which could suggest early cirrhosis.  Patient improving clinically.  Fever curve trending down.  Leukocytosis has trended down.  Discontinued IV vancomycin as MRSA PCR is negative.  Discontinued IV Zosyn.  Continue Augmentin to complete course of antibiotic treatment.  Follow.  #2 ??  GI bleed/2 episodes of blood mixed with stool Patient with history of hemorrhoids and diverticulosis and colonic polyps status post removal per colonoscopy of 09/05/2016 showing tubular adenoma, hyperplastic polyps, no high-grade dysplasia or malignancy.  Patient with no further bloody bowel movements.  H&H has remained stable. CT abdomen and pelvis with no evidence of complication or relating to recent prostate biopsy.  Enlarged prostate in general.  7-8 cm segment of small bowel in the central portion which in the setting of GI bleed questionable abnormal and could be secondary to inflammatory disease or mass lesion.  CT enterography could better evaluate that segment.  Some edema and fat around the gallbladder.  Prominent left lobe of liver which could suggest early cirrhosis.  Unable to get CT enterography due to patient's worsening renal function.  Patient however improving clinically.  Renal function  slowly trending back down.  Patient will likely need to follow-up in the outpatient setting with his gastroenterologist, [facility].    3.  Paroxysmal atrial fibrillation CHA2DS2VASC at least 3.  Currently in normal sinus rhythm.  Overnight patient noted to going to A. fib with heart rates in the 120s.   Cardizem was resumed in addition to patient's amiodarone.  Cardizem has been changed to Coreg per cardiology.  Heart rate improved and now controlled in the 70s and patient back in normal sinus rhythm. Eliquis was held secondary to #2 initially on admission.  Patient with no further bleeding.  Hemoglobin stable.  Eliquis resumed on 02/21/2017.  Follow.   4.  Obstructive sleep apnea CPAP nightly.  5.  Diabetes mellitus type 2 Hemoglobin A1c was 6.7 on December 05, 2016.  CBGs have ranged from 110-136.  Continue sliding scale insulin.  Continue to hold oral hypoglycemic agents.  6.  Hypotension/HTN Blood pressure improved with IV fluids.  ACE inhibitor on hold due to worsening renal function and also hypotension on admission.  Blood pressure improved. Patient's Flomax were resumed 02/20/2017.  Patient's Cardizem has been changed to Coreg per cardiology.  Resumed overnight. Follow for now.    7.  Acute renal failure Likely secondary to a prerenal azotemia as patient was noted to initially be hypotensive on admission with sepsis and now secondary to volume overload.  Pain had 51 cc of urine in 02/20/2017.  Urinalysis with small leukocytes, nitrite negative, 6-30 WBCs.  Renal function with some improvement with hydration, patient now noted to have some volume overload and renal function had worsened.  Creatinine currently at 1.63 from 1.76 and slowly trending back down with diuresis.  Urine output of 2.5 L over the past 24 hours.  2 Lasix 40 mg IV every 12 hours.  Continue Flomax.  ACE inhibitor on hold.  Nephrology following and appreciate input and recommendations.  8.  History of BPH/prostate cancer Status post prostate biopsy approximately 3 weeks ago.  No hematuria.  Patient with history of urinary retention.  Bladder scan with only 51 cc noted, 02/20/2017.  Patient with good urine output.  Outpatient follow-up with urology.  9.  History of COPD with ongoing tobacco abuse Tobacco cessation.  Stable.  Patient states has outpatient appointment with pulmonologist early next week.  10. Hypokalemia/Hypomagnesemia We will place on K Dur 40 mEq daily.    #11 hypoxia/volume overload/probable acute systolic heart failure Patient with sats of 87-91% on nasal cannula over the past 2-3 days.  Oxygenation improving with IV Lasix.  Hypoxia likely secondary to volume overload/acute systolic heart failure secondary to aggressive hydration on presentation.  Patient with 1-2+ bilateral lower extremity edema.  Patient was +6.6 L during this hospitalization.  Patient on at a minimum Lasix 40 mg orally daily prior to admission which was discontinued on admission secondary to hypotension.  Patient's current weight of 239 pounds from 246 pounds from 245 pounds from 238 pounds on admission.  Last weight at cardiologist office on 12/05/2016 was 230 pounds.  Repeat chest x-ray done 02/22/2017 consistent with volume overload.  Cardiac enzymes negative x3.  2D echo done 02/22/2017 with a EF of 40-45%, mild diffuse hypokinesis with no identifiable regional variations.  Patient currently on Lasix 40 mg IV every 12 hours.  Patient with a urine output of 2.5 L over the past 24 hours.  Patient is now -67.5 cc.  Renal function is starting to trend back down.  Patient has been seen in  consultation by cardiology will feel 2D echo abnormality secondary to acute sepsis.  Diltiazem has been changed to Coreg per cardiology.  Cardiology and nephrology following and appreciate their input and recommendations.   DVT prophylaxis: eliquis Code Status: Full Family Communication: Updated patient and wife at bedside. Disposition Plan: Remain in telemetry.  Home once acute volume overload/CHF exacerbation has resolved/improved.   Consultants:   Urology: [name] 02/19/2017  Nephrology: Dr.Schertz 02/22/2017  Cardiology: Dr. Johnsie Cancel 02/22/2017  Procedures:  CT abdomen and pelvis 02/19/2017  Chest x-ray 02/19/2017, 02/22/2017  2D  echo 02/22/2017--- EF 40-45%, mild diffuse hypokinesis with no identifiable regional variations.  Left ventricular diastolic function parameters normal.  Left atrium moderately dilated.  Right atrium moderately dilated.  Antimicrobials:   IV Zosyn 02/19/2017>>>>>>> 02/22/2017  IV vancomycin 02/19/2017>>>>> 02/20/2017  Oral Augmentin 02/22/2017   Subjective: Patient sitting up in chair.  Patient states this is the best he has felt during this hospitalization however not at baseline.  No chest pain.  Patient states lower extremity edema improving.  Patient asking whether he can take a shower.  Patient asking when he can go home.  Objective: Vitals:   02/22/17 1520 02/22/17 2100 02/23/17 0632 02/23/17 0639  BP: 135/83 140/81 (!) 151/90   Pulse: 60 79 72   Resp: 18 18 18    Temp: 98.1 F (36.7 C) 98.9 F (37.2 C) 97.7 F (36.5 C)   TempSrc: Oral Oral Oral   SpO2: 95% 92% 90%   Weight:    108.5 kg (239 lb 3.2 oz)  Height:        Intake/Output Summary (Last 24 hours) at 02/23/2017 1216 Last data filed at 02/23/2017 0347 Gross per 24 hour  Intake 680 ml  Output 2150 ml  Net -1470 ml   Filed Weights   02/21/17 0500 02/22/17 0401 02/23/17 0639  Weight: 111.5 kg (245 lb 13 oz) 111.8 kg (246 lb 7.6 oz) 108.5 kg (239 lb 3.2 oz)    Examination:  General exam: NAD  Respiratory system: Decreasing bibasilar crackles.  No wheezing.  No rhonchi. Respiratory effort normal. Cardiovascular system: S1 & S2 heard, RRR. + JVD. No murmurs, rubs, gallops or clicks.  1-2+ bilateral lower extremity edema.  Gastrointestinal system: Abdomen is mildly distended, soft, nontender to palpation.  No organomegaly or masses felt. Normal bowel sounds heard. Central nervous system: Alert and oriented. No focal neurological deficits. Extremities: Symmetric 5 x 5 power. Skin: No rashes, lesions or ulcers Psychiatry: Judgement and insight appear normal. Mood & affect appropriate.     Data Reviewed: I  have personally reviewed following labs and imaging studies  CBC: Recent Labs  Lab 02/18/17 2251 02/19/17 0623  02/19/17 2018 02/20/17 0838 02/21/17 0340 02/22/17 0348 02/23/17 0616  WBC 13.5* 10.6*  --   --  16.0* 12.1* 8.7 6.9  NEUTROABS 12.0* 10.0*  --   --  14.1* 10.5*  --  4.7  HGB 15.9 13.9   < > 13.4 13.5 12.9* 12.9* 13.4  HCT 45.7 40.2   < > 39.4 39.5 37.6* 37.1* 39.0  MCV 96.2 96.9  --   --  97.1 97.7 96.4 96.5  PLT 197 144*  --   --  115* 105* 113* 147*   < > = values in this interval not displayed.   Basic Metabolic Panel: Recent Labs  Lab 02/19/17 0623 02/20/17 0323 02/20/17 0838 02/21/17 0340 02/22/17 0348 02/23/17 0616  NA 133*  --  134* 135 135 136  K 2.8*  --  4.4 3.9 3.3* 3.5  CL 101  --  106 108 103 104  CO2 22  --  20* 18* 23 24  GLUCOSE 159*  --  137* 129* 132* 128*  BUN 13  --  21* 21* 22* 22*  CREATININE 1.31* 1.89* 1.87* 1.73* 1.76* 1.63*  CALCIUM 7.4*  --  7.6* 7.5* 8.1* 8.3*  MG  --  1.4*  --  2.2 1.9 2.0   GFR: Estimated Creatinine Clearance: 52.1 mL/min (A) (by C-G formula based on SCr of 1.63 mg/dL (H)). Liver Function Tests: Recent Labs  Lab 02/18/17 2251 02/19/17 0623  AST 31 35  ALT 43 37  ALKPHOS 102 86  BILITOT 0.9 1.2  PROT 6.7 5.0*  ALBUMIN 4.2 2.8*   No results for input(s): LIPASE, AMYLASE in the last 168 hours. No results for input(s): AMMONIA in the last 168 hours. Coagulation Profile: Recent Labs  Lab 02/19/17 0623  INR 1.30   Cardiac Enzymes: Recent Labs  Lab 02/19/17 0623 02/22/17 1729 02/22/17 2339 02/23/17 0616  TROPONINI <0.03 <0.03 <0.03 <0.03   BNP (last 3 results) No results for input(s): PROBNP in the last 8760 hours. HbA1C: No results for input(s): HGBA1C in the last 72 hours. CBG: Recent Labs  Lab 02/22/17 1127 02/22/17 1721 02/22/17 2145 02/23/17 0746 02/23/17 1142  GLUCAP 148* 165* 136* 110* 131*   Lipid Profile: No results for input(s): CHOL, HDL, LDLCALC, TRIG, CHOLHDL,  LDLDIRECT in the last 72 hours. Thyroid Function Tests: No results for input(s): TSH, T4TOTAL, FREET4, T3FREE, THYROIDAB in the last 72 hours. Anemia Panel: No results for input(s): VITAMINB12, FOLATE, FERRITIN, TIBC, IRON, RETICCTPCT in the last 72 hours. Sepsis Labs: Recent Labs  Lab 02/19/17 0309 02/19/17 0623 02/19/17 0815 02/20/17 0838 02/21/17 0340 02/22/17 0348  PROCALCITON  --  14.60  --  19.28 6.29 3.63  LATICACIDVEN 4.62* 2.5* 2.2* 1.2  --   --     Recent Results (from the past 240 hour(s))  Culture, blood (Routine x 2)     Status: None (Preliminary result)   Collection Time: 02/18/17 11:10 PM  Result Value Ref Range Status   Specimen Description BLOOD BLOOD RIGHT ARM  Final   Special Requests   Final    BOTTLES DRAWN AEROBIC AND ANAEROBIC Blood Culture adequate volume   Culture   Final    NO GROWTH 3 DAYS Performed at [facility], [place] [address], [place], [place] [address]    Report Status PENDING  Incomplete  Culture, blood (Routine x 2)     Status: None (Preliminary result)   Collection Time: 02/18/17 11:10 PM  Result Value Ref Range Status   Specimen Description BLOOD BLOOD LEFT ARM  Final   Special Requests   Final    BOTTLES DRAWN AEROBIC AND ANAEROBIC Blood Culture adequate volume   Culture   Final    NO GROWTH 3 DAYS Performed at [facility], [place] [address] [place],  [address]    Report Status PENDING  Incomplete  MRSA PCR Screening     Status: None   Collection Time: 02/19/17  5:25 AM  Result Value Ref Range Status   MRSA by PCR NEGATIVE NEGATIVE Final    Comment:        The GeneXpert MRSA Assay (FDA approved for NASAL specimens only), is one component of a comprehensive MRSA colonization surveillance program. It is not intended to diagnose MRSA infection nor to guide or monitor treatment for MRSA infections.  Culture, Urine     Status: None   Collection Time: 02/20/17 10:11 AM  Result Value Ref Range Status    Specimen Description URINE, CLEAN CATCH  Final   Special Requests NONE  Final   Culture   Final    NO GROWTH Performed at [facility], [address], [place], [place] [address]    Report Status 02/21/2017 FINAL  Final         Radiology Studies: Dg Chest 2 View  Result Date: 02/22/2017 CLINICAL DATA:  Shortness of breath EXAM: CHEST  2 VIEW COMPARISON:  02/19/2017, 04/12/2015 FINDINGS: Cardiomegaly with tiny pleural effusion. Increased vascular congestion and development of diffuse interstitial opacities suspicious for pulmonary edema. Aortic atherosclerosis. No pneumothorax. IMPRESSION: Cardiomegaly with development of small effusions, vascular congestion and diffuse interstitial pulmonary edema Electronically Signed   By: Donavan Foil M.D.   On: 02/22/2017 15:49        Scheduled Meds: . amiodarone  200 mg Oral Daily  . amoxicillin-clavulanate  1 tablet Oral Q12H  . apixaban  5 mg Oral BID  . atorvastatin  10 mg Oral QPM  . azelastine  1 spray Each Nare BID  . carvedilol  6.25 mg Oral BID WC  . furosemide  40 mg Intravenous Q12H  . hydrocortisone   Topical BID  . insulin aspart  0-9 Units Subcutaneous TID WC  . montelukast  10 mg Oral QHS  . nicotine  21 mg Transdermal Daily  . potassium chloride  40 mEq Oral Daily  . tamsulosin  0.4 mg Oral QPC supper   Continuous Infusions:    LOS: 4 days    Time spent: 40 minutes    Irine Seal, MD [facility] Pager [phone]  If 7PM-7AM, please contact night-coverage [email or web address] Password [email or web address] 02/23/2017, 12:16 PM

## 2017-02-23 NOTE — Progress Notes (Addendum)
DAILY PROGRESS NOTE   Patient Name: Ronald [name] Date of Encounter: 02/23/2017  Chief Complaint   No complaints overnight  Patient Profile   71 yo male with DM2, COPD, HTN, chronic AF, history of prostate CA, presented with fever, diarrhea, urinary retention - found to be in AKI with volume overload and CHF symptoms.  Subjective   Diuresed 2L negative overnight - troponin negative x 3. Creatinine improved with diuresis, now 1.63. LVEF 40-45% on echo. Maintaining sinus on amiodarone and Eliquis.  Objective   Vitals:   02/22/17 1520 02/22/17 2100 02/23/17 0632 02/23/17 0639  BP: 135/83 140/81 (!) 151/90   Pulse: 60 79 72   Resp: _0 Temp: 98.1 F (36.7 C) 98.9 F (37.2 C) 97.7 F (36.5 C)   TempSrc: Oral Oral Oral   SpO2: 95% 92% 90%   Weight:    239 lb 3.2 oz (108.5 kg)  Height:        Intake/Output Summary (Last 24 hours) at 02/23/2017 0630 Last data filed at 02/23/2017 1601 Gross per 24 hour  Intake 480 ml  Output 2500 ml  Net -2020 ml   Filed Weights   02/21/17 0500 02/22/17 0401 02/23/17 0932  Weight: 245 lb 13 oz (111.5 kg) 246 lb 7.6 oz (111.8 kg) 239 lb 3.2 oz (108.5 kg)    Physical Exam   General appearance: alert and no distress Neck: no carotid bruit, no JVD and thyroid not enlarged, symmetric, no tenderness/mass/nodules Lungs: diminished breath sounds bilaterally Heart: regular rate and rhythm Abdomen: soft, non-tender; bowel sounds normal; no masses,  no organomegaly Extremities: extremities normal, atraumatic, no cyanosis or edema Pulses: 2+ and symmetric Skin: Skin color, texture, turgor normal. No rashes or lesions Neurologic: Grossly normal Psych: Pleasant  Inpatient Medications    Scheduled Meds: . amiodarone  200 mg Oral Daily  . amoxicillin-clavulanate  1 tablet Oral Q12H  . apixaban  5 mg Oral BID  . atorvastatin  10 mg Oral QPM  . azelastine  1 spray Each Nare BID  . diltiazem  360 mg Oral Daily  . furosemide  40 mg  Intravenous Q12H  . hydrocortisone   Topical BID  . insulin aspart  0-9 Units Subcutaneous TID WC  . montelukast  10 mg Oral QHS  . nicotine  21 mg Transdermal Daily  . tamsulosin  0.4 mg Oral QPC supper    Continuous Infusions:   PRN Meds: acetaminophen **OR** acetaminophen, fluticasone, iopamidol, ondansetron **OR** ondansetron (ZOFRAN) IV   Labs   Results for orders placed or performed during the hospital encounter of 02/18/17 (from the past 48 hour(s))  Glucose, capillary     Status: Abnormal   Collection Time: 02/21/17 11:59 AM  Result Value Ref Range   Glucose-Capillary 118 (H) 65 - 99 mg/dL  Glucose, capillary     Status: Abnormal   Collection Time: 02/21/17  4:16 PM  Result Value Ref Range   Glucose-Capillary 108 (H) 65 - 99 mg/dL  Glucose, capillary     Status: Abnormal   Collection Time: 02/21/17  7:43 PM  Result Value Ref Range   Glucose-Capillary 226 (H) 65 - 99 mg/dL   Comment 1 Notify RN   Procalcitonin     Status: None   Collection Time: 02/22/17  3:48 AM  Result Value Ref Range   Procalcitonin 3.63 ng/mL    Comment:        Interpretation: PCT > 2 ng/mL: Systemic infection (sepsis) is likely, unless  other causes are known. (NOTE)         ICU PCT Algorithm               Non ICU PCT Algorithm    ----------------------------     ------------------------------         PCT < 0.25 ng/mL                 PCT < 0.1 ng/mL     Stopping of antibiotics            Stopping of antibiotics       strongly encouraged.               strongly encouraged.    ----------------------------     ------------------------------       PCT level decrease by               PCT < 0.25 ng/mL       >= 80% from peak PCT       OR PCT 0.25 - 0.5 ng/mL          Stopping of antibiotics                                             encouraged.     Stopping of antibiotics           encouraged.    ----------------------------     ------------------------------       PCT level decrease by               PCT >= 0.25 ng/mL       < 80% from peak PCT        AND PCT >= 0.5 ng/mL            Continuing antibiotics                                               encouraged.       Continuing antibiotics            encouraged.    ----------------------------     ------------------------------     PCT level increase compared          PCT > 0.5 ng/mL         with peak PCT AND          PCT >= 0.5 ng/mL             Escalation of antibiotics                                          strongly encouraged.      Escalation of antibiotics        strongly encouraged.   Basic metabolic panel     Status: Abnormal   Collection Time: 02/22/17  3:48 AM  Result Value Ref Range   Sodium 135 135 - 145 mmol/L   Potassium 3.3 (L) 3.5 - 5.1 mmol/L   Chloride 103 101 - 111 mmol/L   CO2 23 22 - 32 mmol/L   Glucose, Bld 132 (H) 65 - 99 mg/dL   BUN 22 (H) 6 - 20  mg/dL   Creatinine, Ser 1.76 (H) 0.61 - 1.24 mg/dL   Calcium 8.1 (L) 8.9 - 10.3 mg/dL   GFR calc non Af Amer 37 (L) >60 mL/min   GFR calc Af Amer 43 (L) >60 mL/min    Comment: (NOTE) The eGFR has been calculated using the CKD EPI equation. This calculation has not been validated in all clinical situations. eGFR's persistently <60 mL/min signify possible Chronic Kidney Disease.    Anion gap 9 5 - 15  CBC     Status: Abnormal   Collection Time: 02/22/17  3:48 AM  Result Value Ref Range   WBC 8.7 4.0 - 10.5 K/uL   RBC 3.85 (L) 4.22 - 5.81 MIL/uL   Hemoglobin 12.9 (L) 13.0 - 17.0 g/dL   HCT 37.1 (L) 39.0 - 52.0 %   MCV 96.4 78.0 - 100.0 fL   MCH 33.5 26.0 - 34.0 pg   MCHC 34.8 30.0 - 36.0 g/dL   RDW 13.3 11.5 - 15.5 %   Platelets 113 (L) 150 - 400 K/uL    Comment: CONSISTENT WITH PREVIOUS RESULT  Magnesium     Status: None   Collection Time: 02/22/17  3:48 AM  Result Value Ref Range   Magnesium 1.9 1.7 - 2.4 mg/dL  Brain natriuretic peptide     Status: Abnormal   Collection Time: 02/22/17  3:48 AM  Result Value Ref Range   B Natriuretic  Peptide 355.6 (H) 0.0 - 100.0 pg/mL  Glucose, capillary     Status: Abnormal   Collection Time: 02/22/17  7:37 AM  Result Value Ref Range   Glucose-Capillary 138 (H) 65 - 99 mg/dL  Glucose, capillary     Status: Abnormal   Collection Time: 02/22/17 11:27 AM  Result Value Ref Range   Glucose-Capillary 148 (H) 65 - 99 mg/dL  Glucose, capillary     Status: Abnormal   Collection Time: 02/22/17  5:21 PM  Result Value Ref Range   Glucose-Capillary 165 (H) 65 - 99 mg/dL  Troponin I (q 6hr x 3)     Status: None   Collection Time: 02/22/17  5:29 PM  Result Value Ref Range   Troponin I <0.03 <0.03 ng/mL  Glucose, capillary     Status: Abnormal   Collection Time: 02/22/17  9:45 PM  Result Value Ref Range   Glucose-Capillary 136 (H) 65 - 99 mg/dL   Comment 1 Notify RN   Troponin I (q 6hr x 3)     Status: None   Collection Time: 02/22/17 11:39 PM  Result Value Ref Range   Troponin I <0.03 <0.03 ng/mL  CBC with Differential/Platelet     Status: Abnormal   Collection Time: 02/23/17  6:16 AM  Result Value Ref Range   WBC 6.9 4.0 - 10.5 K/uL   RBC 4.04 (L) 4.22 - 5.81 MIL/uL   Hemoglobin 13.4 13.0 - 17.0 g/dL   HCT 39.0 39.0 - 52.0 %   MCV 96.5 78.0 - 100.0 fL   MCH 33.2 26.0 - 34.0 pg   MCHC 34.4 30.0 - 36.0 g/dL   RDW 13.1 11.5 - 15.5 %   Platelets 147 (L) 150 - 400 K/uL   Neutrophils Relative % 67 %   Neutro Abs 4.7 1.7 - 7.7 K/uL   Lymphocytes Relative 16 %   Lymphs Abs 1.1 0.7 - 4.0 K/uL   Monocytes Relative 14 %   Monocytes Absolute 1.0 0.1 - 1.0 K/uL   Eosinophils Relative 2 %   Eosinophils  Absolute 0.1 0.0 - 0.7 K/uL   Basophils Relative 1 %   Basophils Absolute 0.0 0.0 - 0.1 K/uL  Basic metabolic panel     Status: Abnormal   Collection Time: 02/23/17  6:16 AM  Result Value Ref Range   Sodium 136 135 - 145 mmol/L   Potassium 3.5 3.5 - 5.1 mmol/L   Chloride 104 101 - 111 mmol/L   CO2 24 22 - 32 mmol/L   Glucose, Bld 128 (H) 65 - 99 mg/dL   BUN 22 (H) 6 - 20 mg/dL    Creatinine, Ser 1.63 (H) 0.61 - 1.24 mg/dL   Calcium 8.3 (L) 8.9 - 10.3 mg/dL   GFR calc non Af Amer 41 (L) >60 mL/min   GFR calc Af Amer 47 (L) >60 mL/min    Comment: (NOTE) The eGFR has been calculated using the CKD EPI equation. This calculation has not been validated in all clinical situations. eGFR's persistently <60 mL/min signify possible Chronic Kidney Disease.    Anion gap 8 5 - 15  Magnesium     Status: None   Collection Time: 02/23/17  6:16 AM  Result Value Ref Range   Magnesium 2.0 1.7 - 2.4 mg/dL  Troponin I (q 6hr x 3)     Status: None   Collection Time: 02/23/17  6:16 AM  Result Value Ref Range   Troponin I <0.03 <0.03 ng/mL  Glucose, capillary     Status: Abnormal   Collection Time: 02/23/17  7:46 AM  Result Value Ref Range   Glucose-Capillary 110 (H) 65 - 99 mg/dL    ECG   N/A - Personally Reviewed  Telemetry   Sinus with occasional PVC's - Personally Reviewed  Radiology    Dg Chest 2 View  Result Date: 02/22/2017 CLINICAL DATA:  Shortness of breath EXAM: CHEST  2 VIEW COMPARISON:  02/19/2017, 04/12/2015 FINDINGS: Cardiomegaly with tiny pleural effusion. Increased vascular congestion and development of diffuse interstitial opacities suspicious for pulmonary edema. Aortic atherosclerosis. No pneumothorax. IMPRESSION: Cardiomegaly with development of small effusions, vascular congestion and diffuse interstitial pulmonary edema Electronically Signed   By: Donavan Foil M.D.   On: 02/22/2017 15:49    Cardiac Studies   N/A  Assessment   1. Principal Problem: 2.   Sepsis ([place]) 3. Active Problems: 4.   Hyperlipidemia 5.   Obstructive sleep apnea 6.   Type 2 diabetes mellitus (HCC) 7.   Stage T1c Adenocarcinoma of the Prostate with a Gleason's Score of 3+3 and a PSA of 6.63 - Favorable Risk 8.   Atrial fibrillation, persistent ([place]):  CHA2DS2-VASc Score - On Eliquis 9.   Hypokalemia 10.   Tobacco abuse 11.   Hypomagnesemia 12.   GI bleed 13.    Hypoxia 14.   ARF (acute renal failure) ([place]) 15.   Plan   1. Good diuresis overnight- creatinine has improved. Continue IV BID lasix today. Maintaining sinus on amiodarone and Eliquis for anticoagulation. BP normal to hypertensive - on diltiazem, will switch to carvedilol for AF and CHF benefit.  Time Spent Directly with Patient:  I have spent a total of 25 minutes with the patient reviewing hospital notes, telemetry, EKGs, labs and examining the patient as well as establishing an assessment and plan that was discussed personally with the patient. > 50% of time was spent in direct patient care.  Length of Stay:  LOS: 4 days   Pixie Casino, MD, [place] Director  of the [facility] Attending Cardiologist  Direct Dial: [phone]  Fax: [phone]  Website:  [email or web address]  Nadean Corwin Artelia Game 02/23/2017, 8:21 AM

## 2017-02-23 NOTE — Progress Notes (Signed)
Ronald [name] Progress Note  Subjective: 2.5 L out yest, creat 1.6  Vitals:   02/22/17 1520 02/22/17 2100 02/23/17 0632 02/23/17 0639  BP: 135/83 140/81 (!) 151/90   Pulse: 60 79 72   Resp: 18 18 18    Temp: 98.1 F (36.7 C) 98.9 F (37.2 C) 97.7 F (36.5 C)   TempSrc: Oral Oral Oral   SpO2: 95% 92% 90%   Weight:    108.5 kg (239 lb 3.2 oz)  Height:        Inpatient medications: . amiodarone  200 mg Oral Daily  . amoxicillin-clavulanate  1 tablet Oral Q12H  . apixaban  5 mg Oral BID  . atorvastatin  10 mg Oral QPM  . azelastine  1 spray Each Nare BID  . diltiazem  360 mg Oral Daily  . furosemide  40 mg Intravenous Q12H  . hydrocortisone   Topical BID  . insulin aspart  0-9 Units Subcutaneous TID WC  . montelukast  10 mg Oral QHS  . nicotine  21 mg Transdermal Daily  . tamsulosin  0.4 mg Oral QPC supper    acetaminophen **OR** acetaminophen, fluticasone, iopamidol, ondansetron **OR** ondansetron (ZOFRAN) IV  Exam: Gen alert, nasal O2, up in chair +JVD to angle of jaw Chest soft rales bilat bases 1/3 up, no wheezing RRR no MRG, no lifts or heaves, PMI normal Abd soft ntnd no mass or ascites +bs Ext has pitting bilat pretib 1-2+ edema / no wounds or ulcers Neuro is alert, Ox 3 , nf   CXR - 11/14 early edema/ congestion Renal US - no hydro, 12- 14 cm kidneys UA =  6-30 rbc, 6-30 wbc, protein negative UNa 25, UCr 49 on 11/14   Impression: 1. Acute renal insufficiency - suspect this is CHF related, decomp CHF.  Needs further diuresis, prob 1-2 more days.  2. Pyuria/ hematuria - on admit, w/ fevers acutely 2 wks after prostate biopsy with some bleeding complications at home, suspect fevers due to UTI and/ or prostate infection.  3. Fevers - as above 4. COPD  5. Hx afib - eliquis/ amio 6. HTN - changing dilt to coreg per cards for afib/ CHF 7. DM on insulin   Plan - as above   Kelly Splinter MD [name] pager [phone]    02/23/2017, 8:24 AM   Recent Labs  Lab 02/21/17 0340 02/22/17 0348 02/23/17 0616  NA 135 135 136  K 3.9 3.3* 3.5  CL 108 103 104  CO2 18* 23 24  GLUCOSE 129* 132* 128*  BUN 21* 22* 22*  CREATININE 1.73* 1.76* 1.63*  CALCIUM 7.5* 8.1* 8.3*   Recent Labs  Lab 02/18/17 2251 02/19/17 0623  AST 31 35  ALT 43 37  ALKPHOS 102 86  BILITOT 0.9 1.2  PROT 6.7 5.0*  ALBUMIN 4.2 2.8*   Recent Labs  Lab 02/20/17 0838 02/21/17 0340 02/22/17 0348 02/23/17 0616  WBC 16.0* 12.1* 8.7 6.9  NEUTROABS 14.1* 10.5*  --  4.7  HGB 13.5 12.9* 12.9* 13.4  HCT 39.5 37.6* 37.1* 39.0  MCV 97.1 97.7 96.4 96.5  PLT 115* 105* 113* 147*   Iron/TIBC/Ferritin/ %Sat No results found for: IRON, TIBC, FERRITIN, IRONPCTSAT

## 2017-02-23 NOTE — Plan of Care (Signed)
  Progressing Clinical Measurements: Ability to maintain clinical measurements within normal limits will improve 02/23/2017 0926 - Progressing by Morio Widen, Royetta Crochet, RN Note Improving.  Pt lungs CTAB.  SOBOE.  On 2L [place] and CPAP while asleep.  3+ edema to BLE.  Good UOP.   Continue with plan of care.

## 2017-02-24 ENCOUNTER — Inpatient Hospital Stay (HOSPITAL_COMMUNITY): Payer: Federal, State, Local not specified - PPO

## 2017-02-24 DIAGNOSIS — E7849 Other hyperlipidemia: Secondary | ICD-10-CM

## 2017-02-24 LAB — BASIC METABOLIC PANEL
Anion gap: 10 (ref 5–15)
BUN: 23 mg/dL — AB (ref 6–20)
CHLORIDE: 105 mmol/L (ref 101–111)
CO2: 26 mmol/L (ref 22–32)
CREATININE: 1.74 mg/dL — AB (ref 0.61–1.24)
Calcium: 8.8 mg/dL — ABNORMAL LOW (ref 8.9–10.3)
GFR calc Af Amer: 44 mL/min — ABNORMAL LOW (ref >60)
GFR calc non Af Amer: 38 mL/min — ABNORMAL LOW (ref >60)
Glucose, Bld: 122 mg/dL — ABNORMAL HIGH (ref 65–99)
Potassium: 3.6 mmol/L (ref 3.5–5.1)
Sodium: 141 mmol/L (ref 135–145)

## 2017-02-24 LAB — CBC
HEMATOCRIT: 39.9 % (ref 39.0–52.0)
HEMOGLOBIN: 14.2 g/dL (ref 13.0–17.0)
MCH: 34.1 pg — AB (ref 26.0–34.0)
MCHC: 35.6 g/dL (ref 30.0–36.0)
MCV: 95.7 fL (ref 78.0–100.0)
Platelets: 170 10*3/uL (ref 150–400)
RBC: 4.17 MIL/uL — ABNORMAL LOW (ref 4.22–5.81)
RDW: 13 % (ref 11.5–15.5)
WBC: 6.7 10*3/uL (ref 4.0–10.5)

## 2017-02-24 LAB — GLUCOSE, CAPILLARY
GLUCOSE-CAPILLARY: 135 mg/dL — AB (ref 65–99)
GLUCOSE-CAPILLARY: 142 mg/dL — AB (ref 65–99)
Glucose-Capillary: 126 mg/dL — ABNORMAL HIGH (ref 65–99)
Glucose-Capillary: 174 mg/dL — ABNORMAL HIGH (ref 65–99)

## 2017-02-24 LAB — CULTURE, BLOOD (ROUTINE X 2)
CULTURE: NO GROWTH
Culture: NO GROWTH
SPECIAL REQUESTS: ADEQUATE
Special Requests: ADEQUATE

## 2017-02-24 LAB — MAGNESIUM: Magnesium: 1.8 mg/dL (ref 1.7–2.4)

## 2017-02-24 NOTE — Progress Notes (Signed)
[facility] Progress Note  Subjective: 5.2 L off UOP yest, wt's down another 4 kg.  Still on nasal O2.   Vitals:   02/23/17 1817 02/23/17 2031 02/23/17 2130 02/24/17 0519  BP:  (!) 152/90  (!) 159/93  Pulse:  73 74 66  Resp:  18 18 20   Temp:  98.4 F (36.9 C)  98.2 F (36.8 C)  TempSrc:  Oral  Oral  SpO2:  93% 95% 97%  Weight: 108.9 kg (240 lb) 107.4 kg (236 lb 12.4 oz)  103.8 kg (228 lb 13.4 oz)  Height:        Inpatient medications: . amiodarone  200 mg Oral Daily  . amoxicillin-clavulanate  1 tablet Oral Q12H  . apixaban  5 mg Oral BID  . atorvastatin  10 mg Oral QPM  . azelastine  1 spray Each Nare BID  . carvedilol  6.25 mg Oral BID WC  . furosemide  40 mg Intravenous Q12H  . hydrocortisone   Topical BID  . insulin aspart  0-9 Units Subcutaneous TID WC  . montelukast  10 mg Oral QHS  . nicotine  21 mg Transdermal Daily  . potassium chloride  40 mEq Oral Daily  . tamsulosin  0.4 mg Oral QPC supper    acetaminophen **OR** acetaminophen, ALPRAZolam, fluticasone, iopamidol, ondansetron **OR** ondansetron (ZOFRAN) IV  Exam: Gen alert, nasal O2, up in chair Chest occ bilat rales at bases, mostly clear RRR no MRG, no lifts or heaves, PMI normal Abd soft ntnd no mass or ascites +bs Ext still has diffuse 2+ pitting pretib edema bilat Neuro is alert, Ox 3 , nf   CXR - 11/14 early edema/ congestion Renal US - no hydro, 12- 14 cm kidneys UA =  6-30 rbc, 6-30 wbc, protein negative UNa 25, UCr 49 on 11/14   Impression: 1. AKI vs CKD - suspect this is CHF related, decomp CHF.  Continues to diurese, still sig vol overload. Breathing improving.  Will repeat CXR today. Needs clear CXR and ambulating w/o O2 before ready for dc.  Cont IV lasix 2. Pyuria/ Fevers - prob prostate and/or UTI after recent prostate bx.  Better.   3. COPD  4. Hx afib - eliquis/ amio 5. HTN - changed dilt to coreg per cards for afib/ CHF 6. DM on insulin   Plan - as  above   Kelly Splinter MD [facility] pager [phone]   02/24/2017, 9:23 AM   Recent Labs  Lab 02/22/17 0348 02/23/17 0616 02/24/17 0454  NA 135 136 141  K 3.3* 3.5 3.6  CL 103 104 105  CO2 23 24 26   GLUCOSE 132* 128* 122*  BUN 22* 22* 23*  CREATININE 1.76* 1.63* 1.74*  CALCIUM 8.1* 8.3* 8.8*   Recent Labs  Lab 02/18/17 2251 02/19/17 0623  AST 31 35  ALT 43 37  ALKPHOS 102 86  BILITOT 0.9 1.2  PROT 6.7 5.0*  ALBUMIN 4.2 2.8*   Recent Labs  Lab 02/20/17 0838 02/21/17 0340 02/22/17 0348 02/23/17 0616 02/24/17 0454  WBC 16.0* 12.1* 8.7 6.9 6.7  NEUTROABS 14.1* 10.5*  --  4.7  --   HGB 13.5 12.9* 12.9* 13.4 14.2  HCT 39.5 37.6* 37.1* 39.0 39.9  MCV 97.1 97.7 96.4 96.5 95.7  PLT 115* 105* 113* 147* 170   Iron/TIBC/Ferritin/ %Sat No results found for: IRON, TIBC, FERRITIN, IRONPCTSAT

## 2017-02-24 NOTE — Plan of Care (Signed)
  Progressing Health Behavior/Discharge Planning: Ability to manage health-related needs will improve 02/24/2017 1102 - Progressing by Arline Ketter, Royetta Crochet, RN Clinical Measurements: Ability to maintain clinical measurements within normal limits will improve 02/24/2017 1102 - Progressing by Zachariah Pavek, Royetta Crochet, RN Will remain free from infection 02/24/2017 1102 - Progressing by Marquasha Brutus, Royetta Crochet, RN Diagnostic test results will improve 02/24/2017 1102 - Progressing by Akeela Busk, Royetta Crochet, RN Cardiovascular complication will be avoided 02/24/2017 1102 - Progressing by Exzavier Ruderman, Royetta Crochet, RN Coping: Level of anxiety will decrease 02/24/2017 1102 - Progressing by Jisele Price, Royetta Crochet, RN Elimination: Will not experience complications related to bowel motility 02/24/2017 1102 - Progressing by Elodie Panameno, Royetta Crochet, RN Will not experience complications related to urinary retention 02/24/2017 1102 - Progressing by Natale Barba, Royetta Crochet, RN Safety: Ability to remain free from injury will improve 02/24/2017 1102 - Progressing by Aquil Duhe, Royetta Crochet, RN

## 2017-02-24 NOTE — Progress Notes (Signed)
PROGRESS NOTE    Ronald [name]  OEV:[id number] DOB: 1945/10/21 DOA: 02/18/2017 PCP: [name], MD   Brief Narrative:   Ronald [name] a 71 y.o.malewithhistory of atrial fibrillation, hypertension, sleep apnea, prostate cancer presents to the ER with complaints of fever and chills and has passed blood in the stools. Patient had a prostate biopsy 10 days ago by [name]. Was doing fine since then. Yesterday afternoon around 2 PM patient had loose bowel stools which was bloody. Also passed some clots. Later in the evening around 3 PM he got stuck in the rain while driving. Again he had a bowel movement later around 5 PM which also was bloody. Patient started developing fever and chills. Denies any chest pain productive cough nausea vomiting abdominal pain. Patient started having persistent fever chills and came to the ER.  Is admitted with sepsis likely due to UTI with elevated pro-calcitonin, H&H is stable, he does have history of hemorrhoids as well.     Assessment & Plan:   Principal Problem:   Severe sepsis ([place]) Active Problems:   Hyperlipidemia   Obstructive sleep apnea   Type 2 diabetes mellitus (HCC)   Stage T1c Adenocarcinoma of the Prostate with a Gleason's Score of 3+3 and a PSA of 6.63 - Favorable Risk   Atrial fibrillation, persistent ([place]):  CHA2DS2-VASc Score - On Eliquis   Hypokalemia   Tobacco abuse   Hypomagnesemia   [name] bleed   Hypoxia   ARF (acute renal failure) (HCC)   Hypervolemia   Acute systolic CHF (congestive heart failure) ([place])  #1 sepsis Questionable etiology.  Thought likely secondary to UTI as patient with recent prostate biopsy done approximately 3-4 weeks ago.  Pro-calcitonin and lactic acid levels were elevated and also concern for bacteremia.  Pro-calcitonin and lactic acid levels trending down.  Blood cultures pending with no growth to date.  Urine cultures negative. Fever curve trending down.  Leukocytosis trending  down.  Patient currently afebrile.  Lactic acid and pro calcitonin levels trending down. CT abdomen and pelvis with no evidence of complication or relating to recent prostate biopsy.  Enlarged prostate in general.  7-8 cm segment of small bowel in the central portion which in the setting of [name] bleed questionable abnormal and could be secondary to inflammatory disease or mass lesion.  CT enterography could better evaluate that segment.  Some edema and fat around the gallbladder.  Prominent left lobe of liver which could suggest early cirrhosis.  Patient improving clinically.  Fever curve trending down.  Leukocytosis has trended down.  Discontinued IV vancomycin as MRSA PCR is negative.  Discontinued IV Zosyn.  Continue Augmentin to complete course of antibiotic treatment.  Follow.  #2 ??  [name] bleed/2 episodes of blood mixed with stool Patient with history of hemorrhoids and diverticulosis and colonic polyps status post removal per colonoscopy of 09/05/2016 showing tubular adenoma, hyperplastic polyps, no high-grade dysplasia or malignancy.  Patient with no further bloody bowel movements.  H&H has remained stable. CT abdomen and pelvis with no evidence of complication or relating to recent prostate biopsy.  Enlarged prostate in general.  7-8 cm segment of small bowel in the central portion which in the setting of [name] bleed questionable abnormal and could be secondary to inflammatory disease or mass lesion.  CT enterography could better evaluate that segment.  Some edema and fat around the gallbladder.  Prominent left lobe of liver which could suggest early cirrhosis.  Unable to get CT enterography due  to patient's worsening renal function.  Patient however improving clinically.  Renal function slowly trending back down.  Patient will likely need to follow-up in the outpatient setting with his gastroenterologist, Ronald [name].    3.  Paroxysmal atrial fibrillation CHA2DS2VASC at least 3.  Currently in normal sinus  rhythm.  Patient noted to go into A. fib with heart rates in the 120s the night of 02/22/2017.   Cardizem was resumed in addition to patient's amiodarone.  Cardizem has been changed to Coreg per cardiology.  Heart rate improved and now controlled in the 70s and patient back in normal sinus rhythm. Eliquis was held secondary to #2 initially on admission.  Patient with no further bleeding.  Hemoglobin stable.  Eliquis resumed on 02/21/2017.  Follow.   4.  Obstructive sleep apnea CPAP nightly.  Likely need outpatient follow-up with pulmonary.  5.  Diabetes mellitus type 2 Hemoglobin A1c was 6.7 on December 05, 2016.  CBGs have ranged from 142-170.  Continue sliding scale insulin.  Continue to hold oral hypoglycemic agents.  6.  Hypotension/HTN Blood pressure improved with IV fluids.  ACE inhibitor on hold due to worsening renal function and also hypotension on admission.  Blood pressure improved. Patient's Flomax were resumed 02/20/2017.  Patient's Cardizem has been changed to Coreg per cardiology. Follow for now.    7.  Acute renal failure Likely secondary to a prerenal azotemia as patient was noted to initially be hypotensive on admission with sepsis and now secondary to volume overload.  Pain had 51 cc of urine in 02/20/2017.  Urinalysis with small leukocytes, nitrite negative, 6-30 WBCs.  Renal function with some improvement with hydration, patient now noted to have some volume overload and renal function had worsened.  Creatinine currently at 1.74 from 1.63 from 1.76 and slowly trending back down with diuresis.  Urine output of 2.5 L over the past 24 hours. Continue Lasix 40 mg IV every 12 hours.  Continue Flomax.  ACE inhibitor on hold.  Nephrology following and appreciate input and recommendations.  8.  History of BPH/prostate cancer Status post prostate biopsy approximately 3 weeks ago.  No hematuria.  Patient with history of urinary retention.  Bladder scan with only 51 cc noted, 02/20/2017.   Patient with good urine output.  Outpatient follow-up with urology.  9.  History of COPD with ongoing tobacco abuse Tobacco cessation.  Stable.  Outpatient follow-up with pulmonary.    10. Hypokalemia/Hypomagnesemia Continue daily potassium supplementation.    #11 hypoxia/volume overload/probable acute systolic heart failure Patient with sats of 87-91% on nasal cannula over the past 3-4 day ago.  Oxygenation improving with IV Lasix.  Hypoxia likely secondary to volume overload/acute systolic heart failure secondary to aggressive hydration on presentation.  Patient with 1-2+ bilateral lower extremity edema.  Patient was +6.6 L during this hospitalization.  Patient on at a minimum Lasix 40 mg orally daily prior to admission which was discontinued on admission secondary to hypotension.  Patient's current weight of 228 pounds from 239 pounds from 246 pounds from 245 pounds from 238 pounds on admission.  Last weight at cardiologist office on 12/05/2016 was 230 pounds.  Repeat chest x-ray done 02/22/2017 consistent with volume overload.  Cardiac enzymes negative x3.  2D echo done 02/22/2017 with a EF of 40-45%, mild diffuse hypokinesis with no identifiable regional variations.  Patient currently on Lasix 40 mg IV every 12 hours.  Repeat chest x-ray done 02/24/2017 with improvement with pulmonary edema.  Patient with a urine output  of 5.2 L over the past 24 hours.  Patient is now - 4.037 L.  Renal function is starting to trend back down but no significant difference from 02/23/2017.  Patient has been seen in consultation by cardiology will feel 2D echo abnormality secondary to acute sepsis.  Diltiazem has been changed to Coreg per cardiology.  Continue IV Lasix.  Cardiology and nephrology following and appreciate their input and recommendations.   DVT prophylaxis: eliquis Code Status: Full Family Communication: Updated patient and friend at bedside. Disposition Plan: Remain in telemetry.  Home once acute  volume overload/CHF exacerbation has resolved/improved.   Consultants:   Urology: [name] 02/19/2017  Nephrology: Dr.Schertz 02/22/2017  Cardiology: Dr. Johnsie Cancel 02/22/2017  Procedures:  CT abdomen and pelvis 02/19/2017  Chest x-ray 02/19/2017, 02/22/2017  2D echo 02/22/2017--- EF 40-45%, mild diffuse hypokinesis with no identifiable regional variations.  Left ventricular diastolic function parameters normal.  Left atrium moderately dilated.  Right atrium moderately dilated.  Antimicrobials:   IV Zosyn 02/19/2017>>>>>>> 02/22/2017  IV vancomycin 02/19/2017>>>>> 02/20/2017  Oral Augmentin 02/22/2017   Subjective: Patient sitting up in chair.  Patient eating lunch.  Denies any chest pain.  States shortness of breath has improved significantly.  Lower extremity edema improving.  Denies any chest pain.    Objective: Vitals:   02/23/17 1817 02/23/17 2031 02/23/17 2130 02/24/17 0519  BP:  (!) 152/90  (!) 159/93  Pulse:  73 74 66  Resp:  18 18 20   Temp:  98.4 F (36.9 C)  98.2 F (36.8 C)  TempSrc:  Oral  Oral  SpO2:  93% 95% 97%  Weight: 108.9 kg (240 lb) 107.4 kg (236 lb 12.4 oz)  103.8 kg (228 lb 13.4 oz)  Height:        Intake/Output Summary (Last 24 hours) at 02/24/2017 1227 Last data filed at 02/24/2017 0721 Gross per 24 hour  Intake 750 ml  Output 6200 ml  Net -5450 ml   Filed Weights   02/23/17 1817 02/23/17 2031 02/24/17 0519  Weight: 108.9 kg (240 lb) 107.4 kg (236 lb 12.4 oz) 103.8 kg (228 lb 13.4 oz)    Examination:  General exam: NAD  Respiratory system: Bibasilar crackles.  No wheezing.  No rhonchi. Respiratory effort normal. Cardiovascular system: S1 & S2 heard, RRR. No JVD. No murmurs, rubs, gallops or clicks.  1-2+ bilateral lower extremity edema.  Gastrointestinal system: Abdomen is mildly distended, soft, nontender to palpation.  No organomegaly or masses felt. Normal bowel sounds heard. Central nervous system: Alert and oriented. No  focal neurological deficits. Extremities: Symmetric 5 x 5 power. Skin: No rashes, lesions or ulcers Psychiatry: Judgement and insight appear normal. Mood & affect appropriate.     Data Reviewed: I have personally reviewed following labs and imaging studies  CBC: Recent Labs  Lab 02/18/17 2251 02/19/17 0623  02/20/17 0838 02/21/17 0340 02/22/17 0348 02/23/17 0616 02/24/17 0454  WBC 13.5* 10.6*  --  16.0* 12.1* 8.7 6.9 6.7  NEUTROABS 12.0* 10.0*  --  14.1* 10.5*  --  4.7  --   HGB 15.9 13.9   < > 13.5 12.9* 12.9* 13.4 14.2  HCT 45.7 40.2   < > 39.5 37.6* 37.1* 39.0 39.9  MCV 96.2 96.9  --  97.1 97.7 96.4 96.5 95.7  PLT 197 144*  --  115* 105* 113* 147* 170   < > = values in this interval not displayed.   Basic Metabolic Panel: Recent Labs  Lab 02/20/17 0323 02/20/17 0838 02/21/17  0340 02/22/17 0348 02/23/17 0616 02/24/17 0454  NA  --  134* 135 135 136 141  K  --  4.4 3.9 3.3* 3.5 3.6  CL  --  106 108 103 104 105  CO2  --  20* 18* 23 24 26   GLUCOSE  --  137* 129* 132* 128* 122*  BUN  --  21* 21* 22* 22* 23*  CREATININE 1.89* 1.87* 1.73* 1.76* 1.63* 1.74*  CALCIUM  --  7.6* 7.5* 8.1* 8.3* 8.8*  MG 1.4*  --  2.2 1.9 2.0 1.8   GFR: Estimated Creatinine Clearance: 47.8 mL/min (A) (by C-G formula based on SCr of 1.74 mg/dL (H)). Liver Function Tests: Recent Labs  Lab 02/18/17 2251 02/19/17 0623  AST 31 35  ALT 43 37  ALKPHOS 102 86  BILITOT 0.9 1.2  PROT 6.7 5.0*  ALBUMIN 4.2 2.8*   No results for input(s): LIPASE, AMYLASE in the last 168 hours. No results for input(s): AMMONIA in the last 168 hours. Coagulation Profile: Recent Labs  Lab 02/19/17 0623  INR 1.30   Cardiac Enzymes: Recent Labs  Lab 02/19/17 0623 02/22/17 1729 02/22/17 2339 02/23/17 0616  TROPONINI <0.03 <0.03 <0.03 <0.03   BNP (last 3 results) No results for input(s): PROBNP in the last 8760 hours. HbA1C: No results for input(s): HGBA1C in the last 72 hours. CBG: Recent Labs    Lab 02/22/17 2145 02/23/17 0746 02/23/17 1142 02/23/17 1739 02/23/17 2141  GLUCAP 136* 110* 131* 102* 170*   Lipid Profile: No results for input(s): CHOL, HDL, LDLCALC, TRIG, CHOLHDL, LDLDIRECT in the last 72 hours. Thyroid Function Tests: No results for input(s): TSH, T4TOTAL, FREET4, T3FREE, THYROIDAB in the last 72 hours. Anemia Panel: No results for input(s): VITAMINB12, FOLATE, FERRITIN, TIBC, IRON, RETICCTPCT in the last 72 hours. Sepsis Labs: Recent Labs  Lab 02/19/17 0309 02/19/17 0623 02/19/17 0815 02/20/17 0838 02/21/17 0340 02/22/17 0348  PROCALCITON  --  14.60  --  19.28 6.29 3.63  LATICACIDVEN 4.62* 2.5* 2.2* 1.2  --   --     Recent Results (from the past 240 hour(s))  Culture, blood (Routine x 2)     Status: None   Collection Time: 02/18/17 11:10 PM  Result Value Ref Range Status   Specimen Description BLOOD BLOOD RIGHT ARM  Final   Special Requests   Final    BOTTLES DRAWN AEROBIC AND ANAEROBIC Blood Culture adequate volume   Culture   Final    NO GROWTH 5 DAYS Performed at [facility], [address] [place], [place] [address]    Report Status 02/24/2017 FINAL  Final  Culture, blood (Routine x 2)     Status: None   Collection Time: 02/18/17 11:10 PM  Result Value Ref Range Status   Specimen Description BLOOD BLOOD LEFT ARM  Final   Special Requests   Final    BOTTLES DRAWN AEROBIC AND ANAEROBIC Blood Culture adequate volume   Culture   Final    NO GROWTH 5 DAYS Performed at [facility], [place] [address], [place], [place] [address]    Report Status 02/24/2017 FINAL  Final  MRSA PCR Screening     Status: None   Collection Time: 02/19/17  5:25 AM  Result Value Ref Range Status   MRSA by PCR NEGATIVE NEGATIVE Final    Comment:        The GeneXpert MRSA Assay (FDA approved for NASAL specimens only), is one component of a comprehensive MRSA colonization  surveillance program. It is not intended to diagnose MRSA infection nor to  guide or monitor treatment for MRSA infections.   Culture, Urine     Status: None   Collection Time: 02/20/17 10:11 AM  Result Value Ref Range Status   Specimen Description URINE, CLEAN CATCH  Final   Special Requests NONE  Final   Culture   Final    NO GROWTH Performed at [facility], [address] [place], [place] [address]    Report Status 02/21/2017 FINAL  Final         Radiology Studies: Dg Chest 2 View  Result Date: 02/24/2017 CLINICAL DATA:  Congestion, COPD. EXAM: CHEST  2 VIEW COMPARISON:  02/22/2017 FINDINGS: Cardiomegaly. Mild vascular congestion. Decreasing interstitial prominence, likely improving interstitial edema. No confluent opacities or effusions. No acute bony abnormality. IMPRESSION: Cardiomegaly, vascular congestion.  Improving edema pattern. Electronically Signed   By: Rolm Baptise M.D.   On: 02/24/2017 11:42   Dg Chest 2 View  Result Date: 02/22/2017 CLINICAL DATA:  Shortness of breath EXAM: CHEST  2 VIEW COMPARISON:  02/19/2017, 04/12/2015 FINDINGS: Cardiomegaly with tiny pleural effusion. Increased vascular congestion and development of diffuse interstitial opacities suspicious for pulmonary edema. Aortic atherosclerosis. No pneumothorax. IMPRESSION: Cardiomegaly with development of small effusions, vascular congestion and diffuse interstitial pulmonary edema Electronically Signed   By: Donavan Foil M.D.   On: 02/22/2017 15:49        Scheduled Meds: . amiodarone  200 mg Oral Daily  . amoxicillin-clavulanate  1 tablet Oral Q12H  . apixaban  5 mg Oral BID  . atorvastatin  10 mg Oral QPM  . azelastine  1 spray Each Nare BID  . carvedilol  6.25 mg Oral BID WC  . furosemide  40 mg Intravenous Q12H  . hydrocortisone   Topical BID  . insulin aspart  0-9 Units Subcutaneous TID WC  . montelukast  10 mg Oral QHS  . nicotine  21 mg Transdermal Daily  . potassium chloride  40 mEq Oral Daily  . tamsulosin  0.4 mg Oral QPC supper   Continuous  Infusions:    LOS: 5 days    Time spent: 40 minutes    Irine Seal, MD [facility] Pager [phone]  If 7PM-7AM, please contact night-coverage [email or web address] Password [facility] 02/24/2017, 12:27 PM

## 2017-02-24 NOTE — Progress Notes (Signed)
DAILY PROGRESS NOTE   Patient Name: Ronald [name] Date of Encounter: 02/24/2017  Chief Complaint   No complaints overnight  Patient Profile   71 yo male with DM2, COPD, HTN, chronic AF, history of prostate CA, presented with fever, diarrhea, urinary retention - found to be in AKI with volume overload and CHF symptoms.  Subjective   Continued excellent diuresis. Weight down to 228 (from 236 lbs). Creatinine up today, but was down yesterday - GFR stable. Remains volume overloaded.  Objective   Vitals:   02/23/17 1817 02/23/17 2031 02/23/17 2130 02/24/17 0519  BP:  (!) 152/90  (!) 159/93  Pulse:  73 74 66  Resp:  '18 18 20  ' Temp:  98.4 F (36.9 C)  98.2 F (36.8 C)  TempSrc:  Oral  Oral  SpO2:  93% 95% 97%  Weight: 240 lb (108.9 kg) 236 lb 12.4 oz (107.4 kg)  228 lb 13.4 oz (103.8 kg)  Height:        Intake/Output Summary (Last 24 hours) at 02/24/2017 1040 Last data filed at 02/24/2017 1115 Gross per 24 hour  Intake 750 ml  Output 6200 ml  Net -5450 ml   Filed Weights   02/23/17 1817 02/23/17 2031 02/24/17 0519  Weight: 240 lb (108.9 kg) 236 lb 12.4 oz (107.4 kg) 228 lb 13.4 oz (103.8 kg)    Physical Exam   General appearance: alert and no distress Neck: no carotid bruit, no JVD and thyroid not enlarged, symmetric, no tenderness/mass/nodules Lungs: diminished breath sounds bilaterally Heart: regular rate and rhythm Abdomen: soft, non-tender; bowel sounds normal; no masses,  no organomegaly and distended Extremities: edema 1+ pitting edema Pulses: 2+ and symmetric Skin: Skin color, texture, turgor normal. No rashes or lesions Neurologic: Grossly normal Psych: Pleasant  Inpatient Medications    Scheduled Meds: . amiodarone  200 mg Oral Daily  . amoxicillin-clavulanate  1 tablet Oral Q12H  . apixaban  5 mg Oral BID  . atorvastatin  10 mg Oral QPM  . azelastine  1 spray Each Nare BID  . carvedilol  6.25 mg Oral BID WC  . furosemide  40 mg Intravenous  Q12H  . hydrocortisone   Topical BID  . insulin aspart  0-9 Units Subcutaneous TID WC  . montelukast  10 mg Oral QHS  . nicotine  21 mg Transdermal Daily  . potassium chloride  40 mEq Oral Daily  . tamsulosin  0.4 mg Oral QPC supper    Continuous Infusions:   PRN Meds: acetaminophen **OR** acetaminophen, ALPRAZolam, fluticasone, iopamidol, ondansetron **OR** ondansetron (ZOFRAN) IV   Labs   Results for orders placed or performed during the hospital encounter of 02/18/17 (from the past 48 hour(s))  Glucose, capillary     Status: Abnormal   Collection Time: 02/22/17 11:27 AM  Result Value Ref Range   Glucose-Capillary 148 (H) 65 - 99 mg/dL  Glucose, capillary     Status: Abnormal   Collection Time: 02/22/17  5:21 PM  Result Value Ref Range   Glucose-Capillary 165 (H) 65 - 99 mg/dL  Troponin I (q 6hr x 3)     Status: None   Collection Time: 02/22/17  5:29 PM  Result Value Ref Range   Troponin I <0.03 <0.03 ng/mL  Glucose, capillary     Status: Abnormal   Collection Time: 02/22/17  9:45 PM  Result Value Ref Range   Glucose-Capillary 136 (H) 65 - 99 mg/dL   Comment 1 Notify RN   Troponin I (  q 6hr x 3)     Status: None   Collection Time: 02/22/17 11:39 PM  Result Value Ref Range   Troponin I <0.03 <0.03 ng/mL  CBC with Differential/Platelet     Status: Abnormal   Collection Time: 02/23/17  6:16 AM  Result Value Ref Range   WBC 6.9 4.0 - 10.5 K/uL   RBC 4.04 (L) 4.22 - 5.81 MIL/uL   Hemoglobin 13.4 13.0 - 17.0 g/dL   HCT 39.0 39.0 - 52.0 %   MCV 96.5 78.0 - 100.0 fL   MCH 33.2 26.0 - 34.0 pg   MCHC 34.4 30.0 - 36.0 g/dL   RDW 13.1 11.5 - 15.5 %   Platelets 147 (L) 150 - 400 K/uL   Neutrophils Relative % 67 %   Neutro Abs 4.7 1.7 - 7.7 K/uL   Lymphocytes Relative 16 %   Lymphs Abs 1.1 0.7 - 4.0 K/uL   Monocytes Relative 14 %   Monocytes Absolute 1.0 0.1 - 1.0 K/uL   Eosinophils Relative 2 %   Eosinophils Absolute 0.1 0.0 - 0.7 K/uL   Basophils Relative 1 %    Basophils Absolute 0.0 0.0 - 0.1 K/uL  Basic metabolic panel     Status: Abnormal   Collection Time: 02/23/17  6:16 AM  Result Value Ref Range   Sodium 136 135 - 145 mmol/L   Potassium 3.5 3.5 - 5.1 mmol/L   Chloride 104 101 - 111 mmol/L   CO2 24 22 - 32 mmol/L   Glucose, Bld 128 (H) 65 - 99 mg/dL   BUN 22 (H) 6 - 20 mg/dL   Creatinine, Ser 1.63 (H) 0.61 - 1.24 mg/dL   Calcium 8.3 (L) 8.9 - 10.3 mg/dL   GFR calc non Af Amer 41 (L) >60 mL/min   GFR calc Af Amer 47 (L) >60 mL/min    Comment: (NOTE) The eGFR has been calculated using the CKD EPI equation. This calculation has not been validated in all clinical situations. eGFR's persistently <60 mL/min signify possible Chronic Kidney Disease.    Anion gap 8 5 - 15  Magnesium     Status: None   Collection Time: 02/23/17  6:16 AM  Result Value Ref Range   Magnesium 2.0 1.7 - 2.4 mg/dL  Troponin I (q 6hr x 3)     Status: None   Collection Time: 02/23/17  6:16 AM  Result Value Ref Range   Troponin I <0.03 <0.03 ng/mL  Glucose, capillary     Status: Abnormal   Collection Time: 02/23/17  7:46 AM  Result Value Ref Range   Glucose-Capillary 110 (H) 65 - 99 mg/dL  Glucose, capillary     Status: Abnormal   Collection Time: 02/23/17 11:42 AM  Result Value Ref Range   Glucose-Capillary 131 (H) 65 - 99 mg/dL  Glucose, capillary     Status: Abnormal   Collection Time: 02/23/17  5:39 PM  Result Value Ref Range   Glucose-Capillary 102 (H) 65 - 99 mg/dL  Glucose, capillary     Status: Abnormal   Collection Time: 02/23/17  9:41 PM  Result Value Ref Range   Glucose-Capillary 170 (H) 65 - 99 mg/dL   Comment 1 Notify RN   CBC     Status: Abnormal   Collection Time: 02/24/17  4:54 AM  Result Value Ref Range   WBC 6.7 4.0 - 10.5 K/uL   RBC 4.17 (L) 4.22 - 5.81 MIL/uL   Hemoglobin 14.2 13.0 - 17.0 g/dL  HCT 39.9 39.0 - 52.0 %   MCV 95.7 78.0 - 100.0 fL   MCH 34.1 (H) 26.0 - 34.0 pg   MCHC 35.6 30.0 - 36.0 g/dL   RDW 13.0 11.5 - 15.5 %     Platelets 170 150 - 400 K/uL  Basic metabolic panel     Status: Abnormal   Collection Time: 02/24/17  4:54 AM  Result Value Ref Range   Sodium 141 135 - 145 mmol/L   Potassium 3.6 3.5 - 5.1 mmol/L   Chloride 105 101 - 111 mmol/L   CO2 26 22 - 32 mmol/L   Glucose, Bld 122 (H) 65 - 99 mg/dL   BUN 23 (H) 6 - 20 mg/dL   Creatinine, Ser 1.74 (H) 0.61 - 1.24 mg/dL   Calcium 8.8 (L) 8.9 - 10.3 mg/dL   GFR calc non Af Amer 38 (L) >60 mL/min   GFR calc Af Amer 44 (L) >60 mL/min    Comment: (NOTE) The eGFR has been calculated using the CKD EPI equation. This calculation has not been validated in all clinical situations. eGFR's persistently <60 mL/min signify possible Chronic Kidney Disease.    Anion gap 10 5 - 15  Magnesium     Status: None   Collection Time: 02/24/17  4:54 AM  Result Value Ref Range   Magnesium 1.8 1.7 - 2.4 mg/dL    ECG   N/A - Personally Reviewed  Telemetry   Sinus with occasional PVC's - Personally Reviewed  Radiology    Dg Chest 2 View  Result Date: 02/22/2017 CLINICAL DATA:  Shortness of breath EXAM: CHEST  2 VIEW COMPARISON:  02/19/2017, 04/12/2015 FINDINGS: Cardiomegaly with tiny pleural effusion. Increased vascular congestion and development of diffuse interstitial opacities suspicious for pulmonary edema. Aortic atherosclerosis. No pneumothorax. IMPRESSION: Cardiomegaly with development of small effusions, vascular congestion and diffuse interstitial pulmonary edema Electronically Signed   By: Donavan Foil M.D.   On: 02/22/2017 15:49    Cardiac Studies   N/A  Assessment   Principal Problem:   Severe sepsis (HCC) Active Problems:   Hyperlipidemia   Obstructive sleep apnea   Type 2 diabetes mellitus (HCC)   Stage T1c Adenocarcinoma of the Prostate with a Gleason's Score of 3+3 and a PSA of 6.63 - Favorable Risk   Atrial fibrillation, persistent ([place]):  CHA2DS2-VASc Score - On Eliquis   Hypokalemia   Tobacco abuse   Hypomagnesemia   GI  bleed   Hypoxia   ARF (acute renal failure) (HCC)   Hypervolemia   Acute systolic CHF (congestive heart failure) ([place])   Plan   1. Continues to diurese with relatively stable creatinine. Appreciate nephrology recs - continue IV diuresis today - may be close to switching to oral lasix.  Time Spent Directly with Patient:  I have spent a total of 15 minutes with the patient reviewing hospital notes, telemetry, EKGs, labs and examining the patient as well as establishing an assessment and plan that was discussed personally with the patient. > 50% of time was spent in direct patient care.  Length of Stay:  LOS: 5 days   Pixie Casino, MD, [facility], [place] Director of the [facility] Lipid [facility] &  Card[facility] Attending Cardiologist  Direct Dial: [phone]  Fax: [phone]  Website:  [email or web address] 02/24/2017, 10:40 AM

## 2017-02-24 NOTE — Progress Notes (Addendum)
SATURATION QUALIFICATIONS: (This note is used to comply with regulatory documentation for home oxygen)  Patient Saturations on Room Air at Rest = 96%  Patient Saturations on Room Air while Ambulating = 89%.  Pt came back up to 94% with a deep breath.  Please briefly explain why patient needs home oxygen:  02/22/17 @ 0026 pt desated to 87%.  Pt has been on 2L O2 with CPAP at night.  Paged attending MD to check if overnight pulse ox needed to see if O2 will be needed at night.

## 2017-02-25 ENCOUNTER — Ambulatory Visit: Payer: Federal, State, Local not specified - PPO | Admitting: Pulmonary Disease

## 2017-02-25 LAB — CBC WITH DIFFERENTIAL/PLATELET
BASOS PCT: 1 %
Basophils Absolute: 0 10*3/uL (ref 0.0–0.1)
Eosinophils Absolute: 0.2 10*3/uL (ref 0.0–0.7)
Eosinophils Relative: 4 %
HEMATOCRIT: 40.4 % (ref 39.0–52.0)
Hemoglobin: 13.8 g/dL (ref 13.0–17.0)
LYMPHS PCT: 21 %
Lymphs Abs: 1.3 10*3/uL (ref 0.7–4.0)
MCH: 32.9 pg (ref 26.0–34.0)
MCHC: 34.2 g/dL (ref 30.0–36.0)
MCV: 96.2 fL (ref 78.0–100.0)
MONO ABS: 0.8 10*3/uL (ref 0.1–1.0)
MONOS PCT: 14 %
NEUTROS ABS: 3.7 10*3/uL (ref 1.7–7.7)
Neutrophils Relative %: 60 %
Platelets: 205 10*3/uL (ref 150–400)
RBC: 4.2 MIL/uL — ABNORMAL LOW (ref 4.22–5.81)
RDW: 12.8 % (ref 11.5–15.5)
WBC: 6 10*3/uL (ref 4.0–10.5)

## 2017-02-25 LAB — BASIC METABOLIC PANEL
Anion gap: 8 (ref 5–15)
BUN: 28 mg/dL — AB (ref 6–20)
CALCIUM: 8.7 mg/dL — AB (ref 8.9–10.3)
CO2: 27 mmol/L (ref 22–32)
CREATININE: 1.81 mg/dL — AB (ref 0.61–1.24)
Chloride: 104 mmol/L (ref 101–111)
GFR calc Af Amer: 42 mL/min — ABNORMAL LOW (ref >60)
GFR calc non Af Amer: 36 mL/min — ABNORMAL LOW (ref >60)
GLUCOSE: 132 mg/dL — AB (ref 65–99)
Potassium: 3.4 mmol/L — ABNORMAL LOW (ref 3.5–5.1)
Sodium: 139 mmol/L (ref 135–145)

## 2017-02-25 LAB — GLUCOSE, CAPILLARY
Glucose-Capillary: 122 mg/dL — ABNORMAL HIGH (ref 65–99)
Glucose-Capillary: 154 mg/dL — ABNORMAL HIGH (ref 65–99)

## 2017-02-25 MED ORDER — GLIPIZIDE 5 MG PO TABS: 2.5000 mg | ORAL_TABLET | Freq: Two times a day (BID) | ORAL | 0 refills | Status: DC

## 2017-02-25 MED ORDER — FUROSEMIDE 40 MG PO TABS: 40.0000 mg | ORAL_TABLET | Freq: Two times a day (BID) | ORAL | 1 refills | Status: DC

## 2017-02-25 MED ORDER — POTASSIUM CHLORIDE CRYS ER 20 MEQ PO TBCR: 40.0000 meq | EXTENDED_RELEASE_TABLET | Freq: Every day | ORAL | 1 refills | Status: DC

## 2017-02-25 MED ORDER — NICOTINE 21 MG/24HR TD PT24: 21.0000 mg | MEDICATED_PATCH | Freq: Every day | TRANSDERMAL | 0 refills | Status: DC

## 2017-02-25 MED ORDER — ALPRAZOLAM 0.25 MG PO TABS: 0.2500 mg | ORAL_TABLET | Freq: Every evening | ORAL | 0 refills | Status: DC | PRN

## 2017-02-25 MED ORDER — HYDROCORTISONE 2.5 % RE CREA: TOPICAL_CREAM | Freq: Two times a day (BID) | RECTAL | 0 refills | Status: DC

## 2017-02-25 MED ORDER — AMOXICILLIN-POT CLAVULANATE 875-125 MG PO TABS: 1.0000 | ORAL_TABLET | Freq: Two times a day (BID) | ORAL | 0 refills | Status: AC

## 2017-02-25 MED ORDER — CARVEDILOL 6.25 MG PO TABS: 6.2500 mg | ORAL_TABLET | Freq: Two times a day (BID) | ORAL | 1 refills | Status: DC

## 2017-02-25 NOTE — Progress Notes (Signed)
[facility] Progress Note  Subjective: 5.7 L off UOP yest, wt's down another 2.6 kg.  Now on RA- wants to go home  Vitals:   02/24/17 2020 02/24/17 2054 02/25/17 0546 02/25/17 1248  BP: (!) 155/90  (!) 171/91 140/79  Pulse: 79  63 66  Resp: 18  19 18   Temp: 98.2 F (36.8 C)  98.1 F (36.7 C) 97.9 F (36.6 C)  TempSrc: Oral  Oral Oral  SpO2: 96% 95% 93% 100%  Weight: 103.7 kg (228 lb 9.9 oz)  101.1 kg (222 lb 14.2 oz)   Height:        Inpatient medications: . amiodarone  200 mg Oral Daily  . amoxicillin-clavulanate  1 tablet Oral Q12H  . apixaban  5 mg Oral BID  . atorvastatin  10 mg Oral QPM  . azelastine  1 spray Each Nare BID  . carvedilol  6.25 mg Oral BID WC  . furosemide  40 mg Intravenous Q12H  . hydrocortisone   Topical BID  . insulin aspart  0-9 Units Subcutaneous TID WC  . montelukast  10 mg Oral QHS  . nicotine  21 mg Transdermal Daily  . potassium chloride  40 mEq Oral Daily  . tamsulosin  0.4 mg Oral QPC supper    acetaminophen **OR** acetaminophen, ALPRAZolam, fluticasone, iopamidol, ondansetron **OR** ondansetron (ZOFRAN) IV  Exam: Gen alert, nasal O2, up in chair Chest occ bilat rales at bases, mostly clear RRR no MRG, no lifts or heaves, PMI normal Abd soft ntnd no mass or ascites +bs Ext still has ankle and foot edema  Neuro is alert, Ox 3 , nf   CXR - 11/14 early edema/ congestion Renal US - no hydro, 12- 14 cm kidneys UA =  6-30 rbc, 6-30 wbc, protein negative UNa 25, UCr 49 on 11/14   Impression: 1. AKI vs CKD - suspect this is CHF related, decomp CHF.  Continues to diurese, still some vol overload. Breathing improving.  No proteinuria and kidney function fairly stable think is obstructive and hemodynamic.  I do not think he needs specific renal follow up as more time away from this obstructive episode and when he is not having huge amounts of diuresis I think his renal function will stabilize  2. Pyuria/ Fevers - prob  prostate and/or UTI after recent prostate bx.  Better.   3. COPD  4. Hx afib - eliquis/ amio 5. HTN - changed dilt to coreg per cards for afib/ CHF.  I would send him out on lasix PO 40 mg BID and he knows to follow up with cardiology - I have also told him to follow a 2 liter fluid restriction  6. DM on insulin   Ronald [name] A  02/25/2017, 2:04 PM   Recent Labs  Lab 02/23/17 0616 02/24/17 0454 02/25/17 0527  NA 136 141 139  K 3.5 3.6 3.4*  CL 104 105 104  CO2 24 26 27   GLUCOSE 128* 122* 132*  BUN 22* 23* 28*  CREATININE 1.63* 1.74* 1.81*  CALCIUM 8.3* 8.8* 8.7*   Recent Labs  Lab 02/18/17 2251 02/19/17 0623  AST 31 35  ALT 43 37  ALKPHOS 102 86  BILITOT 0.9 1.2  PROT 6.7 5.0*  ALBUMIN 4.2 2.8*   Recent Labs  Lab 02/21/17 0340  02/23/17 0616 02/24/17 0454 02/25/17 0527  WBC 12.1*   < > 6.9 6.7 6.0  NEUTROABS 10.5*  --  4.7  --  3.7  HGB 12.9*   < >  13.4 14.2 13.8  HCT 37.6*   < > 39.0 39.9 40.4  MCV 97.7   < > 96.5 95.7 96.2  PLT 105*   < > 147* 170 205   < > = values in this interval not displayed.   Iron/TIBC/Ferritin/ %Sat No results found for: IRON, TIBC, FERRITIN, IRONPCTSAT

## 2017-02-25 NOTE — Progress Notes (Signed)
Physical Therapy Treatment/Discharge from PT Patient Details Name: Ronald [name] MRN: [id number] DOB: 03-26-1946 Today's Date: 02/25/2017    History of Present Illness This 71 year old man was admitted with fever, chills, and blood in stool.  Pt recently had prostate biopsy. Dx'd with sepsis.  PMH:  A Fib and prostate CA, DM and COPD    PT Comments    Pt progressing well. Ind with activity on today. Encouraged pt to continue walking as tolerated.  Will d/c from PT. O2 sat: 92% on RA at rest, 86% on RA while ambulating-made RN aware.    Follow Up Recommendations  No PT follow up     Equipment Recommendations  None recommended by PT    Recommendations for Other Services       Precautions / Restrictions Precautions Precautions: None Precaution Comments: monitor O2 Restrictions Weight Bearing Restrictions: No    Mobility  Bed Mobility               General bed mobility comments: oob  Transfers Overall transfer level: Independent                  Ambulation/Gait Ambulation/Gait assistance: Independent Ambulation Distance (Feet): 350 Feet Assistive device: None Gait Pattern/deviations: WFL(Within Functional Limits)     General Gait Details: No dyspnea or fatigue noted however O2 sat reading dropped to 86% on RA while ambulating.    Stairs            Wheelchair Mobility    Modified Rankin (Stroke Patients Only)       Balance                                            Cognition Arousal/Alertness: Awake/alert Behavior During Therapy: WFL for tasks assessed/performed Overall Cognitive Status: Within Functional Limits for tasks assessed                                        Exercises      General Comments        Pertinent Vitals/Pain Pain Assessment: No/denies pain    Home Living                      Prior Function            PT Goals (current goals can now be found in the care  plan section) Progress towards PT goals: Goals met/education completed, patient discharged from PT    Frequency           PT Plan      Co-evaluation              AM-PAC PT "6 Clicks" Daily Activity  Outcome Measure  Difficulty turning over in bed (including adjusting bedclothes, sheets and blankets)?: None Difficulty moving from lying on back to sitting on the side of the bed? : None Difficulty sitting down on and standing up from a chair with arms (e.g., wheelchair, bedside commode, etc,.)?: None Help needed moving to and from a bed to chair (including a wheelchair)?: None Help needed walking in hospital room?: None Help needed climbing 3-5 steps with a railing? : None 6 Click Score: 24    End of Session   Activity Tolerance: Patient tolerated treatment well Patient left:  in chair;with family/visitor present;with call bell/phone within reach         Time: [phone] PT Time Calculation (min) (ACUTE ONLY): 8 min  Charges:  $Gait Training: 8-22 mins                    G Codes:          Weston Anna, MPT Pager: [phone]

## 2017-02-25 NOTE — Progress Notes (Signed)
Patient Saturations on Room Air at Rest = 98%  Patient Saturations on [employer] while Ambulating = 94% or greater.

## 2017-02-25 NOTE — Care Management Note (Signed)
Case Management Note  Patient Details  Name: Ronald [name] MRN: [id number] Date of Birth: Jul 03, 1945  Subjective/Objective:  No CM needs.                  Action/Plan:d/c home.   Expected Discharge Date:  02/25/17               Expected Discharge Plan:  Home/Self Care  In-House Referral:     Discharge planning Services  CM Consult  Post Acute Care Choice:    Choice offered to:     DME Arranged:    DME Agency:     HH Arranged:    HH Agency:     Status of Service:  Completed, signed off  If discussed at [employer] of Stay Meetings, dates discussed:    Additional Comments:  Dessa Phi, RN 02/25/2017, 3:17 PM

## 2017-02-25 NOTE — Plan of Care (Signed)
  Progressing Clinical Measurements: Ability to maintain clinical measurements within normal limits will improve 02/25/2017 1304 - Progressing by Tashianna Broome, Royetta Crochet, RN Respiratory complications will improve 02/25/2017 1304 - Progressing by Chrislynn Mosely, Royetta Crochet, RN Cardiovascular complication will be avoided 02/25/2017 1304 - Progressing by Trayshawn Durkin, Royetta Crochet, RN

## 2017-02-25 NOTE — Progress Notes (Signed)
Progress Note  Patient Name: Ronald [name] Date of Encounter: 02/25/2017  Primary Cardiologist: [name]  Subjective   Breathing better today, not aware of importance of watching salt in diet, can improve this. Does not think he will smoke after d/c. No CP.   Unaware of the afib, has never felt it.   Inpatient Medications    Scheduled Meds: . amiodarone  200 mg Oral Daily  . amoxicillin-clavulanate  1 tablet Oral Q12H  . apixaban  5 mg Oral BID  . atorvastatin  10 mg Oral QPM  . azelastine  1 spray Each Nare BID  . carvedilol  6.25 mg Oral BID WC  . furosemide  40 mg Intravenous Q12H  . hydrocortisone   Topical BID  . insulin aspart  0-9 Units Subcutaneous TID WC  . montelukast  10 mg Oral QHS  . nicotine  21 mg Transdermal Daily  . potassium chloride  40 mEq Oral Daily  . tamsulosin  0.4 mg Oral QPC supper   Continuous Infusions:  PRN Meds: acetaminophen **OR** acetaminophen, ALPRAZolam, fluticasone, iopamidol, ondansetron **OR** ondansetron (ZOFRAN) IV   Vital Signs    Vitals:   02/24/17 1955 02/24/17 2020 02/24/17 2054 02/25/17 0546  BP:  (!) 155/90  (!) 171/91  Pulse: 71 79  63  Resp: 18 18  19   Temp:  98.2 F (36.8 C)  98.1 F (36.7 C)  TempSrc:  Oral  Oral  SpO2: 94% 96% 95% 93%  Weight:  228 lb 9.9 oz (103.7 kg)  222 lb 14.2 oz (101.1 kg)  Height:        Intake/Output Summary (Last 24 hours) at 02/25/2017 0801 Last data filed at 02/25/2017 0545 Gross per 24 hour  Intake 840 ml  Output 4720 ml  Net -3880 ml   Filed Weights   02/24/17 0519 02/24/17 2020 02/25/17 0546  Weight: 228 lb 13.4 oz (103.8 kg) 228 lb 9.9 oz (103.7 kg) 222 lb 14.2 oz (101.1 kg)    Telemetry    Atrial fib>>SR, yesterday - Personally Reviewed  ECG    11/16, SR, HR 63, no acute changes - Personally Reviewed  Physical Exam   GEN: No acute distress.   Neck:  JVD 8-9 cm Cardiac: RRR, no murmurs, rubs, or gallops.  Respiratory: few rales bilaterally. GI: Soft,  nontender, non-distended  MS: No edema; No deformity. Neuro:  Nonfocal  Psych: Normal affect   Labs    Chemistry Recent Labs  Lab 02/18/17 2251 02/19/17 0623  02/23/17 0616 02/24/17 0454 02/25/17 0527  NA 131* 133*   < > 136 141 139  K 3.5 2.8*   < > 3.5 3.6 3.4*  CL 98* 101   < > 104 105 104  CO2 22 22   < > 24 26 27   GLUCOSE 152* 159*   < > 128* 122* 132*  BUN 13 13   < > 22* 23* 28*  CREATININE 1.00 1.31*   < > 1.63* 1.74* 1.81*  CALCIUM 8.8* 7.4*   < > 8.3* 8.8* 8.7*  PROT 6.7 5.0*  --   --   --   --   ALBUMIN 4.2 2.8*  --   --   --   --   AST 31 35  --   --   --   --   ALT 43 37  --   --   --   --   ALKPHOS 102 86  --   --   --   --  BILITOT 0.9 1.2  --   --   --   --   GFRNONAA >60 53*   < > 41* 38* 36*  GFRAA >60 >60   < > 47* 44* 42*  ANIONGAP 11 10   < > 8 10 8    < > = values in this interval not displayed.     Hematology Recent Labs  Lab 02/23/17 0616 02/24/17 0454 02/25/17 0527  WBC 6.9 6.7 6.0  RBC 4.04* 4.17* 4.20*  HGB 13.4 14.2 13.8  HCT 39.0 39.9 40.4  MCV 96.5 95.7 96.2  MCH 33.2 34.1* 32.9  MCHC 34.4 35.6 34.2  RDW 13.1 13.0 12.8  PLT 147* 170 205    Cardiac Enzymes Recent Labs  Lab 02/19/17 0623 02/22/17 1729 02/22/17 2339 02/23/17 0616  TROPONINI <0.03 <0.03 <0.03 <0.03     BNP Recent Labs  Lab 02/22/17 0348  BNP 355.6*     Radiology    Dg Chest 2 View  Result Date: 02/24/2017 CLINICAL DATA:  Congestion, COPD. EXAM: CHEST  2 VIEW COMPARISON:  02/22/2017 FINDINGS: Cardiomegaly. Mild vascular congestion. Decreasing interstitial prominence, likely improving interstitial edema. No confluent opacities or effusions. No acute bony abnormality. IMPRESSION: Cardiomegaly, vascular congestion.  Improving edema pattern. Electronically Signed   By: Rolm Baptise M.D.   On: 02/24/2017 11:42    Cardiac Studies   ECHO: 02/22/2017 - Left ventricle: The cavity size was normal. Wall thickness was   normal. Systolic function was mildly  to moderately reduced. The   estimated ejection fraction was in the range of 40% to 45%. Mild   diffuse hypokinesis with no identifiable regional variations.   Left ventricular diastolic function parameters were normal. - Left atrium: The atrium was moderately dilated. - Right atrium: The atrium was moderately dilated.  Patient Profile     71 y.o. Marland Kitchenyear-old with hx of DM2, COPD / emphysema, gout, HL, HTN, persistent afib, prostate Ca (2014) who presented to ED on 11/12 with fevers, diarrhea and urinary retention, AKI w/ peak Cr 1.89,  hx of recent prostate biopsy 10/30. Rx sepsis>>volume overload>>echo w/ EF 40-45%, mod biatrial enlargement. Cards seeing for CHF  Assessment & Plan    1.   Acute systolic CHF (congestive heart failure) (HCC) - pt willing to do daily weights, feels his dry weight is about 220 pounds. -Patient willing to watch the sodium in his diet more carefully, he does not add salt but is aware that there is hidden salt in foods. -Feels his breathing is at or close to baseline but he admits that his O2 sats dropped to 88% when ambulating, he is 94% at rest on room air - borderline for changing to po Lasix - feel underlying lung disease makes him symptomatic with minimal volume overload- However, Cr peak 1.89, and is up to 1.81 today, after diuresis -At best plan may be to switch him to oral Lasix today and have him follow his weights and sodium as an outpatient. He will need early follow-up.  2.  Cardiomyopathy: -He is on beta-blocker and statin.  No aspirin because of the Eliquis. -New diagnosis, no history of CAD but he has multiple cardiac risk factors. -No chest pain, cardiac enzymes are negative this admission. -MD advise on ischemic eval and the timing  Otherwise, per IM Principal Problem:   Severe sepsis ([place]) Active Problems:   Hyperlipidemia   Obstructive sleep apnea   Type 2 diabetes mellitus ([place])   Stage T1c Adenocarcinoma of the Prostate with  a  Gleason's Score of 3+3 and a PSA of 6.63 - Favorable Risk   Atrial fibrillation, persistent ([place]):  CHA2DS2-VASc Score - On Eliquis   Hypokalemia   Tobacco abuse   Hypomagnesemia   GI bleed   Hypoxia   ARF (acute renal failure) (HCC)   Hypervolemia     For questions or updates, please contact [facility] Please consult [email or web address] for contact info under Cardiology/STEMI.      Signed, Rosaria Ferries, PA-C  02/25/2017, 8:01 AM   As above, patient seen and examined. Patient denies dyspnea or chest pain. He remains in sinus rhythm today. He has minimal volume overloaded on examination. He can be discharged from a cardiac standpoint on preadmission medications. I would change Lasix at discharge to 40 mg in the morning and 40 at approximately 2 PM. He states 40 mg daily was not keeping fluids down. He will need transition of care appointment in one week with bmet at that time. LV function mildly reduced. Continue beta blocker at discharge. This is felt possibly secondary to sepsis at time of admission. Would repeat echocardiogram in 3 months. Follow-up [name] after discharge.  Kirk Ruths, MD

## 2017-02-25 NOTE — Discharge Summary (Signed)
Physician Discharge Summary  Ronald [name] NAT:[id number] DOB: 1945/09/22 DOA: 02/18/2017  PCP: [name], MD  Admit date: 02/18/2017 Discharge date: 02/25/2017  Time spent: 65 minutes  Recommendations for Outpatient Follow-up:  1. Follow-up with [name] 03/07/2017 for follow-up on acute systolic CHF exacerbation, abnormal 2D echo.  Patient will likely need repeat 2D echo done in 3 months.  Patient also need a basic metabolic profile done to follow-up on electrolytes and renal function. 2. Follow-up with [name], MD in 2 weeks.  On follow-up patient will need a basic metabolic profile done to follow-up on electrolytes and renal function.  Patient's metformin was discontinued secondary to worsening renal function and patient placed on oral glipizide with further outpatient management and follow-up with PCP. 3. Follow with Dr. Fuller Plan, gastroenterology in 3-4 weeks for follow-up on abnormal CT scan of the abdomen and pelvis obtained during the hospitalization.   Discharge Diagnoses:  Principal Problem:   Severe sepsis ([place]) Active Problems:   Hyperlipidemia   Obstructive sleep apnea   Type 2 diabetes mellitus (HCC)   Stage T1c Adenocarcinoma of the Prostate with a Gleason's Score of 3+3 and a PSA of 6.63 - Favorable Risk   Atrial fibrillation, persistent ([place]):  CHA2DS2-VASc Score - On Eliquis   Hypokalemia   Tobacco abuse   Hypomagnesemia   GI bleed   Hypoxia   ARF (acute renal failure) (HCC)   Hypervolemia   Acute systolic CHF (congestive heart failure) ([place])   Discharge Condition: stable and improved  Diet recommendation: Heart healthy  Filed Weights   02/24/17 0519 02/24/17 2020 02/25/17 0546  Weight: 103.8 kg (228 lb 13.4 oz) 103.7 kg (228 lb 9.9 oz) 101.1 kg (222 lb 14.2 oz)    History of present illness:  Per Dr.Karakandy Ronald [name] is a 71 y.o. male with history of atrial fibrillation, hypertension, sleep apnea, prostate cancer presented  to the ER with complaints of fever and chills and has passed blood in the stools.  Patient had a prostate biopsy 3-4 weeks ago by [name].  Was doing fine since then.  The afternoon prior to admission, around 2 PM patient had loose bowel stools which was bloody.  Also passed some clots.  Later in the evening around 3 PM he got stuck in the rain while driving.  Again he had a bowel movement later around 5 PM which also was bloody.  Patient started developing fever and chills.  Denied any chest pain productive cough nausea vomiting abdominal pain.  Patient started having persistent fever chills and presented to ED.  ED Course: In the ER patient was found to have a fever of 103 F and initial lactate was normal but subsequent one was done again because patient had spiked fever again.  Repeat lactate was 4.6.  Patient was kept on sepsis protocol with blood cultures urine cultures influenza PCR started on empiric antibiotics.  Patient became mildly hypoxic in the ER.  Chest x-ray did not show any infiltrates.  Patient was admitted for sepsis likely source could be urinary tract given the recent prostate biopsy.  Patient also had diarrhea which could also be the source.    Hospital Course:  #1 sepsis Questionable etiology.  Thought likely secondary to UTI as patient with recent prostate biopsy done approximately 3-4 weeks ago.  Pro-calcitonin and lactic acid levels were elevated and also concern for bacteremia.  Pro-calcitonin and lactic acid levels trended down.  Blood cultures negative.  Urine cultures negative.  Fever curve trended down.  Leukocytosis trended down.  Patient remained afebrile the rest of the hospitalization.  Lactic acid and pro calcitonin levels trended down. CT abdomen and pelvis with no evidence of complication or relating to recent prostate biopsy.  Enlarged prostate in general.  7-8 cm segment of small bowel in the central portion which in the setting of GI bleed questionable abnormal  and could be secondary to inflammatory disease or mass lesion.  CT enterography could better evaluate that segment.  Some edema and fat around the gallbladder.  Prominent left lobe of liver which could suggest early cirrhosis.  Patient improving clinically.    On admission was initially placed empirically on IV vancomycin and IV Zosyn.  As patient improved clinically IV antibiotics were narrowed down and patient transitioned to oral Augmentin.  Patient be discharged home on 2 more days of oral Augmentin to complete a course of antibiotic treatment.  Outpatient follow-up with PCP.   #2 hypoxia/volume overload/Acute systolic heart failure Patient with sats of 87-91% on nasal cannula  early on during the hospitalization.  Over the past 3-4 day ago.  Hypoxia likely secondary to volume overload/acute systolic heart failure secondary to aggressive hydration on presentation.  Patient noted to be volume overloaded clinically during the hospitalization with JVD and lower extremity edema.  Pulmonary crackles, chest x-ray consistent with volume overload and hypoxia.  Patient was +6.6 L during this hospitalization.  Patient on at a minimum Lasix 40 mg orally daily prior to admission which was discontinued on admission secondary to hypotension.  Patient's weight on discharge was 222 pounds from 228 pounds from 239 pounds from 246 pounds from 245 pounds from 238 pounds on admission.  Last weight at cardiologist office on 12/05/2016 was 230 pounds.  Repeat chest x-ray done 02/22/2017 consistent with volume overload.  Cardiac enzymes negative x3.  2D echo done 02/22/2017 with a EF of 40-45%, mild diffuse hypokinesis with no identifiable regional variations.  Patient was placed on Lasix 40 mg IV every 12 hours.  Repeat chest x-ray done 02/24/2017 with improvement with pulmonary edema.  Patient with good urine output throughout the hospitalization and was -9.027 L by day of discharge.  Cardiology and nephrology were consulted and  followed the patient throughout the hospitalization and adjustments were made to patient's medications.  Renal function him to have plateaued around 1.8 and it was felt with continued diuresis and transition to oral Lasix renal function will improve slowly.  Patient has been seen in consultation by cardiology will feel 2D echo abnormality secondary to acute sepsis.  Diltiazem has been changed to Coreg per cardiology.    Patient will likely need repeat 2D echo done in about 3 months.  Patient was transitioned to Lasix 40 mg twice daily on discharge and is to follow-up with cardiology in the outpatient setting.  Patient be discharged in stable and improved condition.  On day of discharge patient sats greater than 95% on room air and on ambulation.   #3 ??  GI bleed/2 episodes of blood mixed with stool Patient with history of hemorrhoids and diverticulosis and colonic polyps status post removal per colonoscopy of 09/05/2016 showing tubular adenoma, hyperplastic polyps, no high-grade dysplasia or malignancy.  Patient with no further bloody bowel movements.  H&H has remained stable. CT abdomen and pelvis with no evidence of complication or relating to recent prostate biopsy.  Enlarged prostate in general.  7-8 cm segment of small bowel in the central portion which in the setting of  GI bleed questionable abnormal and could be secondary to inflammatory disease or mass lesion.  CT enterography could better evaluate that segment.  Some edema and fat around the gallbladder.  Prominent left lobe of liver which could suggest early cirrhosis.  Unable to get CT enterography due to patient's worsening renal function.  Patient however improving clinically.  Renal function slowly trending back down.  Patient will likely need to follow-up in the outpatient setting with his gastroenterologist, [facility].    4.  Paroxysmal atrial fibrillation CHA2DS2VASC at least 3.  Patient noted to go into A. fib with heart rates in the 120s  the night of 02/22/2017.   Cardizem was resumed in addition to patient's amiodarone and patient remained in normal sinus rhythm during the rest of the hospitalization.  Cardizem was changed to Coreg per cardiology.  Heart rate improved and now controlled in the 70s and patient back in normal sinus rhythm. Eliquis was held initially secondary to #2.  Patient with no further bleeding.  Hemoglobin stable.  Eliquis resumed on 02/21/2017 with no further bleeding.   5.  Obstructive sleep apnea CPAP nightly.  Likely need outpatient follow-up with pulmonary.  6.  Diabetes mellitus type 2 Hemoglobin A1c was 6.7 on December 05, 2016.    Patient's oral hypoglycemic agents were held during the hospitalization and patient maintained on a sliding scale insulin.  Patient's metformin was discontinued on discharge and patient placed on low-dose glipizide due to his renal function.  Outpatient follow-up with PCP.    7.  Hypotension/HTN Blood pressure improved with IV fluids.  ACE inhibitor on hold due to worsening renal function and also hypotension on admission.  Blood pressure improved. Patient's Flomax were resumed 02/20/2017.  Patient's Cardizem has been changed to Coreg per cardiology.  Outpatient follow-up.  8.  Acute renal failure Likely secondary to a prerenal azotemia as patient was noted to initially be hypotensive on admission with sepsis and now secondary to decompensated acute systolic CHF exacerbation.  Patient had 51 cc of urine in 02/20/2017.  Urinalysis with small leukocytes, nitrite negative, 6-30 WBCs.  Renal function with some improvement with hydration, patient now noted to have some volume overload and renal function had worsened.  Creatinine currently plateaued at 1.8 with diuresis.  Patient was seen by nephrology who had felt patient is acute kidney injury was secondary to obstructive and hemodynamics.  Patient was also resumed on his home regimen of Flomax.  ACE inhibitor discontinued secondary  to worsening renal function.  Outpatient follow-up with PCP.    9.  History of BPH/prostate cancer Status post prostate biopsy approximately 3 weeks ago.  No hematuria.  Patient with history of urinary retention.  Bladder scan with only 51 cc noted, 02/20/2017.  Patient with good urine output.   10.  History of COPD with ongoing tobacco abuse Tobacco cessation.  Stable.  Outpatient follow-up with pulmonary.    11. Hypokalemia/Hypomagnesemia Likely secondary to diuresis.  Patient was placed on daily potassium supplementation.        Procedures:  CT abdomen and pelvis 02/19/2017  Chest x-ray 02/19/2017, 02/22/2017  2D echo 02/22/2017--- EF 40-45%, mild diffuse hypokinesis with no identifiable regional variations.  Left ventricular diastolic function parameters normal.  Left atrium moderately dilated.  Right atrium moderately dilated      Consultations:  Urology: [name] 02/19/2017  Nephrology: Dr.Schertz 02/22/2017  Cardiology: Dr. Johnsie Cancel 02/22/2017      Discharge Exam: Vitals:   02/25/17 0546 02/25/17 1248  BP: Marland Kitchen)  171/91 140/79  Pulse: 63 66  Resp: 19 18  Temp: 98.1 F (36.7 C) 97.9 F (36.6 C)  SpO2: 93% 100%    General: NAD Cardiovascular: RRR Respiratory: Decreases bibasilar crackles.  Discharge Instructions   Discharge Instructions    Diet - low sodium heart healthy   Complete by:  As directed    2 L/Day fluid restriction   Increase activity slowly   Complete by:  As directed      Current Discharge Medication List    START taking these medications   Details  ALPRAZolam (XANAX) 0.25 MG tablet Take 1 tablet (0.25 mg total) at bedtime as needed by mouth for anxiety or sleep. Qty: 15 tablet, Refills: 0    amoxicillin-clavulanate (AUGMENTIN) 875-125 MG tablet Take 1 tablet every 12 (twelve) hours for 2 days by mouth. Qty: 4 tablet, Refills: 0    carvedilol (COREG) 6.25 MG tablet Take 1 tablet (6.25 mg total) 2 (two) times daily with  a meal by mouth. Qty: 60 tablet, Refills: 1    glipiZIDE (GLUCOTROL) 5 MG tablet Take 0.5 tablets (2.5 mg total) 2 (two) times daily by mouth. Qty: 60 tablet, Refills: 0    hydrocortisone (ANUSOL-HC) 2.5 % rectal cream Apply 2 (two) times daily topically. Qty: 30 g, Refills: 0    nicotine (NICODERM CQ - DOSED IN MG/24 HOURS) 21 mg/24hr patch Place 1 patch (21 mg total) daily onto the skin. Qty: 28 patch, Refills: 0    potassium chloride SA (K-DUR,KLOR-CON) 20 MEQ tablet Take 2 tablets (40 mEq total) daily by mouth. Qty: 60 tablet, Refills: 1      CONTINUE these medications which have CHANGED   Details  furosemide (LASIX) 40 MG tablet Take 1 tablet (40 mg total) 2 (two) times daily by mouth. Take 1 tablet in the morning and second tablet at 2pm daily. Qty: 60 tablet, Refills: 1      CONTINUE these medications which have NOT CHANGED   Details  acetaminophen (TYLENOL) 500 MG tablet Take 500 mg by mouth every 6 (six) hours as needed for mild pain or moderate pain.    albuterol (VENTOLIN HFA) 108 (90 Base) MCG/ACT inhaler Inhale 2 puffs into the lungs every 6 (six) hours as needed for wheezing or shortness of breath. Qty: 1 Inhaler, Refills: 5    amiodarone (PACERONE) 200 MG tablet TAKE 1 TABLET (200 MG TOTAL) BY MOUTH DAILY. Qty: 90 tablet, Refills: 2    apixaban (ELIQUIS) 5 MG TABS tablet Take 5 mg by mouth 2 (two) times daily.    atorvastatin (LIPITOR) 10 MG tablet TAKE 1 TABLET (10 MG TOTAL) BY MOUTH EVERY EVENING. Qty: 90 tablet, Refills: 2    azelastine (ASTELIN) 0.1 % nasal spray Place 1 spray into both nostrils 2 (two) times daily. Use in each nostril as directed Qty: 30 mL, Refills: 12    colchicine 0.6 MG tablet Two tabs at onset of gout flare and then one tab twice daily as needed Qty: 30 tablet, Refills: 2    fluticasone (FLONASE) 50 MCG/ACT nasal spray Place 2 sprays into both nostrils daily as needed for allergies or rhinitis.    montelukast (SINGULAIR) 10 MG  tablet Take 1 tablet (10 mg total) by mouth at bedtime. Qty: 90 tablet, Refills: 1    tamsulosin (FLOMAX) 0.4 MG CAPS capsule Take 0.4 mg by mouth daily after supper.  Refills: 5    Lancets (ACCU-CHEK SOFT TOUCH) lancets Check blood sugars once per day. DX: E11.9 Qty: 100  each, Refills: 2      STOP taking these medications     diltiazem (CARDIZEM CD) 360 MG 24 hr capsule      lisinopril (PRINIVIL,ZESTRIL) 5 MG tablet      metFORMIN (GLUCOPHAGE) 500 MG tablet      ACCU-CHEK AVIVA PLUS test strip        No Known Allergies Follow-up Information    Leonie Man, MD Follow up on 03/07/2017.   Specialty:  Cardiology Why:  Please arrive at 10:00 am for a 10:20 am appt and blood work. Contact information: [address] [place] [address] [phone]        [name], MD. Schedule an appointment as soon as possible for a visit in 2 week(s).   Specialty:  Family Medicine Contact information: [address] [place] [address] [phone]        Ladene Artist, MD. Schedule an appointment as soon as possible for a visit in 3 week(s).   Specialty:  Gastroenterology Why:  3-4 weeks f/u on abn ct scan of abd/pelvis Contact information: [address] [place] [address] [phone]            The results of significant diagnostics from this hospitalization (including imaging, microbiology, ancillary and laboratory) are listed below for reference.    Significant Diagnostic Studies: Ct Abdomen Pelvis Wo Contrast  Result Date: 02/19/2017 CLINICAL DATA:  Fever, chills and gastrointestinal bleeding. Prostate biopsy 10 days ago. EXAM: CT ABDOMEN AND PELVIS WITHOUT CONTRAST TECHNIQUE: Multidetector CT imaging of the abdomen and pelvis was performed following the standard protocol without IV contrast. COMPARISON:  None. FINDINGS: Lower chest: Minimal basilar atelectasis. No significant chest finding. Coronary artery  calcification is noted. Hepatobiliary: Relative prominence of the left lobe suggest the possibility of early cirrhosis. This is not definite. No sign of focal liver lesion. No calcified gallstones. Question mild stranding of the fat around the gallbladder. Is there clinical concern about cholecystitis? Pancreas: Normal Spleen: Normal Adrenals/Urinary Tract: Mild adrenal hyperplasia. Possible 1 cm adenoma on the left. No worrisome adrenal finding. Kidneys are normal in size. There is some vascular calcification. No evidence of stone disease or focal lesion. No hydronephrosis. Bladder is thick-walled. Stomach/Bowel: Diverticulosis of the colon without evidence of diverticulitis. In the mid small bowel, there is a 7 or 8 cm segment of the small intestine which is not fill with contrast and could be abnormal due to enteritis or a mass lesion. Whereas this could simply P poor filling and contraction, in the setting of gastrointestinal bleeding, this is difficult to ignore. CT enterography could be employed to evaluate this segment of small intestine. Vascular/Lymphatic: Aortic atherosclerosis. No aneurysm. IVC is normal. No retroperitoneal adenopathy. Reproductive: Prostate gland is enlarged. No discernible focal lesion. No complication of biopsy detectable. Other: No free fluid or air. Musculoskeletal: Ordinary lower lumbar degenerative changes. IMPRESSION: No evidence of complication or relating to the recent prostate biopsy. Enlarged prostate in general. 7-8 cm segment of small bowel in the central portion which, in the setting of gastrointestinal bleeding, is questionably abnormal and could be secondary to inflammatory disease or mass lesion. CT enterography could better evaluate that segment. Some edema in the fat around the gallbladder. Is there clinical concern regarding any potential for cholecystitis? If so, ultrasound would be suggested. Prominence of the left lobe of the liver which could suggest early  cirrhosis. Aortic atherosclerosis. Electronically Signed   By: Nelson Chimes M.D.   On:  02/19/2017 14:59   Dg Chest 2 View  Result Date: 02/24/2017 CLINICAL DATA:  Congestion, COPD. EXAM: CHEST  2 VIEW COMPARISON:  02/22/2017 FINDINGS: Cardiomegaly. Mild vascular congestion. Decreasing interstitial prominence, likely improving interstitial edema. No confluent opacities or effusions. No acute bony abnormality. IMPRESSION: Cardiomegaly, vascular congestion.  Improving edema pattern. Electronically Signed   By: Rolm Baptise M.D.   On: 02/24/2017 11:42   Dg Chest 2 View  Result Date: 02/22/2017 CLINICAL DATA:  Shortness of breath EXAM: CHEST  2 VIEW COMPARISON:  02/19/2017, 04/12/2015 FINDINGS: Cardiomegaly with tiny pleural effusion. Increased vascular congestion and development of diffuse interstitial opacities suspicious for pulmonary edema. Aortic atherosclerosis. No pneumothorax. IMPRESSION: Cardiomegaly with development of small effusions, vascular congestion and diffuse interstitial pulmonary edema Electronically Signed   By: Donavan Foil M.D.   On: 02/22/2017 15:49   Dg Chest 2 View  Result Date: 02/19/2017 CLINICAL DATA:  71 year old male with fever and confusion. EXAM: CHEST  2 VIEW COMPARISON:  Chest radiograph dated 02/18/2017 FINDINGS: There is borderline cardiomegaly with vascular congestion similar to earlier radiograph. No focal consolidation, pleural effusion, or pneumothorax. Left lung base atelectatic changes noted. There is atherosclerotic calcification of the aortic arch. No acute osseous pathology. IMPRESSION: 1. No focal consolidation. 2. Cardiomegaly with mild vascular congestion. No significant interval change compared to the earlier radiograph. Electronically Signed   By: Anner Crete M.D.   On: 02/19/2017 02:46   Dg Chest 2 View  Result Date: 02/18/2017 CLINICAL DATA:  Fever and cough EXAM: CHEST  2 VIEW COMPARISON:  04/12/2015 FINDINGS: Scarring at the bilateral lung  bases. No focal consolidation or pleural effusion. Mild cardiomegaly with slight central vascular congestion. Aortic atherosclerosis. No pneumothorax. Degenerative changes of the spine. IMPRESSION: 1. Scarring at the left lung base.  No acute pulmonary infiltrate 2. Borderline to mild cardiomegaly with mild central congestion Electronically Signed   By: Donavan Foil M.D.   On: 02/18/2017 23:32   US Renal  Result Date: 02/20/2017 CLINICAL DATA:  Acute renal failure. History of diabetes, prostate malignancy, and hypertension. EXAM: RENAL / URINARY TRACT ULTRASOUND COMPLETE COMPARISON:  Abdominal CT scan of February 19, 2017 FINDINGS: Right Kidney: Length: 14 cm. The renal cortical echotexture remains lower than that of the liver. There is no focal mass nor hydronephrosis. Left Kidney: Length: 13.7 cm. The renal cortical echotexture is similar to that on the right. There is no focal mass nor hydronephrosis. Bladder: The urinary bladder wall appears trabeculated. There is no discrete bladder wall mass. No echogenic debris or stones are observed. IMPRESSION: Normal appearance of both kidneys. Trabeculated appearance of the urinary bladder. Electronically Signed   By: David  Martinique M.D.   On: 02/20/2017 12:29    Microbiology: Recent Results (from the past 240 hour(s))  Culture, blood (Routine x 2)     Status: None   Collection Time: 02/18/17 11:10 PM  Result Value Ref Range Status   Specimen Description BLOOD BLOOD RIGHT ARM  Final   Special Requests   Final    BOTTLES DRAWN AEROBIC AND ANAEROBIC Blood Culture adequate volume   Culture   Final    NO GROWTH 5 DAYS Performed at [facility], [address], [place], [place] [address]    Report Status 02/24/2017 FINAL  Final  Culture, blood (Routine x 2)     Status: None   Collection Time: 02/18/17 11:10 PM  Result Value Ref Range Status   Specimen Description BLOOD BLOOD LEFT ARM  Final   Special Requests   Final    BOTTLES DRAWN AEROBIC AND  ANAEROBIC Blood Culture adequate volume   Culture   Final    NO GROWTH 5 DAYS Performed at [facility], [address] [place], [place] [address]    Report Status 02/24/2017 FINAL  Final  MRSA PCR Screening     Status: None   Collection Time: 02/19/17  5:25 AM  Result Value Ref Range Status   MRSA by PCR NEGATIVE NEGATIVE Final    Comment:        The GeneXpert MRSA Assay (FDA approved for NASAL specimens only), is one component of a comprehensive MRSA colonization surveillance program. It is not intended to diagnose MRSA infection nor to guide or monitor treatment for MRSA infections.   Culture, Urine     Status: None   Collection Time: 02/20/17 10:11 AM  Result Value Ref Range Status   Specimen Description URINE, CLEAN CATCH  Final   Special Requests NONE  Final   Culture   Final    NO GROWTH Performed at [facility], [address] [place], [place] [address]    Report Status 02/21/2017 FINAL  Final     Labs: Basic Metabolic Panel: Recent Labs  Lab 02/20/17 0323  02/21/17 0340 02/22/17 0348 02/23/17 0616 02/24/17 0454 02/25/17 0527  NA  --    < > 135 135 136 141 139  K  --    < > 3.9 3.3* 3.5 3.6 3.4*  CL  --    < > 108 103 104 105 104  CO2  --    < > 18* 23 24 26 27   GLUCOSE  --    < > 129* 132* 128* 122* 132*  BUN  --    < > 21* 22* 22* 23* 28*  CREATININE 1.89*   < > 1.73* 1.76* 1.63* 1.74* 1.81*  CALCIUM  --    < > 7.5* 8.1* 8.3* 8.8* 8.7*  MG 1.4*  --  2.2 1.9 2.0 1.8  --    < > = values in this interval not displayed.   Liver Function Tests: Recent Labs  Lab 02/18/17 2251 02/19/17 0623  AST 31 35  ALT 43 37  ALKPHOS 102 86  BILITOT 0.9 1.2  PROT 6.7 5.0*  ALBUMIN 4.2 2.8*   No results for input(s): LIPASE, AMYLASE in the last 168 hours. No results for input(s): AMMONIA in the last 168 hours. CBC: Recent Labs  Lab 02/19/17 0623  02/20/17 0838 02/21/17 0340 02/22/17 0348 02/23/17 0616 02/24/17 0454 02/25/17 0527  WBC  10.6*  --  16.0* 12.1* 8.7 6.9 6.7 6.0  NEUTROABS 10.0*  --  14.1* 10.5*  --  4.7  --  3.7  HGB 13.9   < > 13.5 12.9* 12.9* 13.4 14.2 13.8  HCT 40.2   < > 39.5 37.6* 37.1* 39.0 39.9 40.4  MCV 96.9  --  97.1 97.7 96.4 96.5 95.7 96.2  PLT 144*  --  115* 105* 113* 147* 170 205   < > = values in this interval not displayed.   Cardiac Enzymes: Recent Labs  Lab 02/19/17 0623 02/22/17 1729 02/22/17 2339 02/23/17 0616  TROPONINI <0.03 <0.03 <0.03 <0.03   BNP: BNP (last 3 results) Recent Labs    02/22/17 0348  BNP 355.6*    ProBNP (last 3 results) No results for input(s): PROBNP in the last 8760 hours.  CBG: Recent Labs  Lab 02/24/17  1259 02/24/17 1747 02/24/17 2018 02/25/17 0818 02/25/17 1213  GLUCAP 142* 126* 174* 122* 154*       Signed:  Irine Seal MD.  [facility] 02/25/2017, 3:13 PM

## 2017-02-26 ENCOUNTER — Telehealth: Payer: Self-pay | Admitting: *Deleted

## 2017-02-26 NOTE — Telephone Encounter (Signed)
Transition Care Management Follow-up Telephone Call  Per Discharge Summary: Admit date: 02/18/2017 Discharge date: 02/25/2017  Time spent: 65 minutes  Recommendations for Outpatient Follow-up:  1. Follow-up with [name] 03/07/2017 for follow-up on acute systolic CHF exacerbation, abnormal 2D echo.  Patient will likely need repeat 2D echo done in 3 months.  Patient also need a basic metabolic profile done to follow-up on electrolytes and renal function. 2. Follow-up with [name], MD in 2 weeks.  On follow-up patient will need a basic metabolic profile done to follow-up on electrolytes and renal function.  Patient's metformin was discontinued secondary to worsening renal function and patient placed on oral glipizide with further outpatient management and follow-up with PCP. 3. Follow with Dr. Fuller Plan, gastroenterology in 3-4 weeks for follow-up on abnormal CT scan of the abdomen and pelvis obtained during the hospitalization.   Discharge Diagnoses:  Principal Problem:   Severe sepsis ([place]) Active Problems:   Hyperlipidemia   Obstructive sleep apnea   Type 2 diabetes mellitus (HCC)   Stage T1c Adenocarcinoma of the Prostate with a Gleason's Score of 3+3 and a PSA of 6.63 - Favorable Risk   Atrial fibrillation, persistent ([place]):  CHA2DS2-VASc Score - On Eliquis   Hypokalemia   Tobacco abuse   Hypomagnesemia   GI bleed   Hypoxia   ARF (acute renal failure) (HCC)   Hypervolemia   Acute systolic CHF (congestive heart failure) ([place])   Discharge Condition: stable and improved  Diet recommendation: Heart healthy  --   How have you been since you were released from the hospital? "I just got home yesterday so I'm good."   Do you understand why you were in the hospital? yes   Do you understand the discharge instructions? yes   Where were you discharged to? Home   Items Reviewed:  Medications reviewed: yes  Allergies reviewed: yes  Dietary changes  reviewed: yes, heart healthy, 2L fluid restriction, low sodium  Referrals reviewed: yes, follow-up with cardiology, pulmonology, and GI   Functional Questionnaire:   Activities of Daily Living (ADLs):   He states they are independent in the following: ambulation, bathing and hygiene, feeding, continence, grooming, toileting and dressing States they require assistance with the following: none   Any transportation issues/concerns?: no, wife will drive him to appts   Any patient concerns? no   Confirmed importance and date/time of follow-up visits scheduled yes  Provider Appointment booked with Dr. Carolann Littler 03/08/17 @ 2:00pm  Confirmed with patient if condition begins to worsen call PCP or go to the ER.  Patient was given the office number and encouraged to call back with question or concerns.  : yes

## 2017-03-01 ENCOUNTER — Other Ambulatory Visit: Payer: Self-pay | Admitting: Family Medicine

## 2017-03-06 ENCOUNTER — Ambulatory Visit (INDEPENDENT_AMBULATORY_CARE_PROVIDER_SITE_OTHER): Payer: Federal, State, Local not specified - PPO | Admitting: Pulmonary Disease

## 2017-03-06 ENCOUNTER — Encounter: Payer: Self-pay | Admitting: Pulmonary Disease

## 2017-03-06 VITALS — BP 120/76 | HR 74 | Ht 71.0 in | Wt 228.0 lb

## 2017-03-06 DIAGNOSIS — Z9989 Dependence on other enabling machines and devices: Secondary | ICD-10-CM

## 2017-03-06 DIAGNOSIS — G4733 Obstructive sleep apnea (adult) (pediatric): Secondary | ICD-10-CM | POA: Diagnosis not present

## 2017-03-06 DIAGNOSIS — J438 Other emphysema: Secondary | ICD-10-CM | POA: Diagnosis not present

## 2017-03-06 NOTE — Patient Instructions (Signed)
Will arrange for new home care company for CPAP mask and supplies  Follow up in 1 year

## 2017-03-06 NOTE — Progress Notes (Signed)
Current Outpatient Medications on File Prior to Visit  Medication Sig  . acetaminophen (TYLENOL) 500 MG tablet Take 500 mg by mouth every 6 (six) hours as needed for mild pain or moderate pain.  Marland Kitchen albuterol (VENTOLIN HFA) 108 (90 Base) MCG/ACT inhaler Inhale 2 puffs into the lungs every 6 (six) hours as needed for wheezing or shortness of breath.  . ALPRAZolam (XANAX) 0.25 MG tablet Take 1 tablet (0.25 mg total) at bedtime as needed by mouth for anxiety or sleep.  Marland Kitchen amiodarone (PACERONE) 200 MG tablet TAKE 1 TABLET (200 MG TOTAL) BY MOUTH DAILY.  Marland Kitchen apixaban (ELIQUIS) 5 MG TABS tablet Take 5 mg by mouth 2 (two) times daily.  Marland Kitchen atorvastatin (LIPITOR) 10 MG tablet TAKE 1 TABLET (10 MG TOTAL) BY MOUTH EVERY EVENING.  Marland Kitchen azelastine (ASTELIN) 0.1 % nasal spray Place 1 spray into both nostrils 2 (two) times daily. Use in each nostril as directed  . carvedilol (COREG) 6.25 MG tablet Take 1 tablet (6.25 mg total) 2 (two) times daily with a meal by mouth.  . colchicine 0.6 MG tablet Two tabs at onset of gout flare and then one tab twice daily as needed  . fluticasone (FLONASE) 50 MCG/ACT nasal spray Place 2 sprays into both nostrils daily as needed for allergies or rhinitis.  . furosemide (LASIX) 40 MG tablet Take 1 tablet (40 mg total) 2 (two) times daily by mouth. Take 1 tablet in the morning and second tablet at 2pm daily.  Marland Kitchen glipiZIDE (GLUCOTROL) 5 MG tablet Take 0.5 tablets (2.5 mg total) 2 (two) times daily by mouth.  . hydrocortisone (ANUSOL-HC) 2.5 % rectal cream Apply 2 (two) times daily topically.  . Lancets (ACCU-CHEK SOFT TOUCH) lancets Check blood sugars once per day. DX: E11.9  . montelukast (SINGULAIR) 10 MG tablet Take 1 tablet (10 mg total) by mouth at bedtime.  . nicotine (NICODERM CQ - DOSED IN MG/24 HOURS) 21 mg/24hr patch Place 1 patch (21 mg total) daily onto the skin.  . potassium chloride SA (K-DUR,KLOR-CON) 20 MEQ tablet Take 2 tablets (40 mEq total) daily by mouth.  . tamsulosin  (FLOMAX) 0.4 MG CAPS capsule Take 0.4 mg by mouth daily after supper.    No current facility-administered medications on file prior to visit.      Chief Complaint  Patient presents with  . Hospitalization Follow-up    Pt had sepsis, CHF with fluid around the heart and lungs. Pt was in [facility] for one week out on Monday Feb 25 2017. Pt is wanting to follow up since that visit.      Sleep tests PSG 07/14/04 >> AHI 77 Auto CPAP 11/21/15 to 12/20/15 >> used on 30 of 30 nights with average 9 hrs 24 min.  Average AHI 1.3 with median CPAP 8 and 95 th percentile CPAP 11 cm H2O  Cardiac tests Echo11/16/18 >> EF 40 to 45%  Pulmonary tests PFT 11/01/14 >> FEV1 3.42 (104%), FEV1% 81, TLC 7.00 (99%), DLCO 55%, + BD HRCT chest 01/31/15 >> diffuse centrilobular GGO micronodules, mild airtrapping, mild centrilobular/paraseptal emphysema PFT 12/16/15 >> FEV1 3.13 (96%), FEV1% 79, TLC 6.59 (93%), DLCO 54% PFT 12/11/16 >> FEV1 3.23 (96%), FEV1% 78, TLC 7.42 (102%), DLCO 63%, no BD  Past medical history A fib, HTN, HLD, Gout, Tinea versicolor, Prostate cancer  Past surgical history, Family history, Social history, Allergies reviewed  Vital Signs BP 120/76 (BP Location: Left Arm, Cuff Size: Normal)   Pulse 74   Ht 5\' 11"  (1.803  m)   Wt 228 lb (103.4 kg)   SpO2 97%   BMI 31.80 kg/m   History of Present Illness Ronald [name] is a 71 y.o. 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  He was in hospital earlier this month with sepsis from UTI.  He had lots of edema, and was told he had fluid in his lungs.  He has been on lasix.  He has lost a significant amount of weight and was told this was related to fluid losses from diuresis.  He is not having cough, wheeze, sputum, or chest pain.  He keeps up with his activities.  He quit smoking.  PFT from September 2018 showed improvement in diffusion capacity compared to 2017.  Echo from 02/22/17 showed mild systolic heart failure.  He  has been using his CPAP.  This helps.  He needs a new DME to get supplies.  Physical Exam  General - No distress ENT - No sinus tenderness, no oral exudate, no LAN Cardiac - s1s2 regular, no murmur Chest - No wheeze/rales/dullness Back - No focal tenderness Abd - Soft, non-tender Ext - No edema Neuro - Normal strength Skin - No rashes Psych - normal mood, and behavior   Assessment/Plan  Interstitial lung disease with clinical diagnosis of respiratory bronchiolitis. - no evidence for this on recent imaging studies, and recent PFT showed improved diffusion capacity - discussed the importance of remaining off cigarettes permanently  COPD with emphysema. - minimal symptoms - prn ventolin  Upper airway cough syndrome with post nasal drip likely from seasonal allergies. - astelin, flonase, singulair  Obstructive sleep apnea. - He is compliant with CPAP and reports benefit - continue auto CPAP   Patient Instructions  Will arrange for new home care company for CPAP mask and supplies  Follow up in 1 year   Chesley Mires, MD [place] [facility] Pager:  [phone] 03/06/2017, 3:42 PM

## 2017-03-07 ENCOUNTER — Ambulatory Visit (INDEPENDENT_AMBULATORY_CARE_PROVIDER_SITE_OTHER): Payer: Federal, State, Local not specified - PPO | Admitting: Cardiology

## 2017-03-07 ENCOUNTER — Encounter: Payer: Self-pay | Admitting: Cardiology

## 2017-03-07 VITALS — BP 120/82 | HR 60 | Ht 71.0 in | Wt 224.8 lb

## 2017-03-07 DIAGNOSIS — I10 Essential (primary) hypertension: Secondary | ICD-10-CM

## 2017-03-07 DIAGNOSIS — R600 Localized edema: Secondary | ICD-10-CM

## 2017-03-07 DIAGNOSIS — R0600 Dyspnea, unspecified: Secondary | ICD-10-CM

## 2017-03-07 DIAGNOSIS — E785 Hyperlipidemia, unspecified: Secondary | ICD-10-CM | POA: Diagnosis not present

## 2017-03-07 DIAGNOSIS — I429 Cardiomyopathy, unspecified: Secondary | ICD-10-CM

## 2017-03-07 DIAGNOSIS — Z01810 Encounter for preprocedural cardiovascular examination: Secondary | ICD-10-CM | POA: Diagnosis not present

## 2017-03-07 DIAGNOSIS — Z7901 Long term (current) use of anticoagulants: Secondary | ICD-10-CM | POA: Diagnosis not present

## 2017-03-07 DIAGNOSIS — I481 Persistent atrial fibrillation: Secondary | ICD-10-CM | POA: Diagnosis not present

## 2017-03-07 DIAGNOSIS — E876 Hypokalemia: Secondary | ICD-10-CM

## 2017-03-07 DIAGNOSIS — R0609 Other forms of dyspnea: Secondary | ICD-10-CM

## 2017-03-07 DIAGNOSIS — I5021 Acute systolic (congestive) heart failure: Secondary | ICD-10-CM | POA: Diagnosis not present

## 2017-03-07 DIAGNOSIS — I43 Cardiomyopathy in diseases classified elsewhere: Secondary | ICD-10-CM

## 2017-03-07 DIAGNOSIS — F17210 Nicotine dependence, cigarettes, uncomplicated: Secondary | ICD-10-CM

## 2017-03-07 DIAGNOSIS — Z79899 Other long term (current) drug therapy: Secondary | ICD-10-CM

## 2017-03-07 DIAGNOSIS — I4819 Other persistent atrial fibrillation: Secondary | ICD-10-CM

## 2017-03-07 MED ORDER — LOSARTAN POTASSIUM 25 MG PO TABS: 25.0000 mg | ORAL_TABLET | Freq: Every day | ORAL | 3 refills | Status: DC

## 2017-03-07 NOTE — Progress Notes (Signed)
PCP: [name], MD  Clinic Note: Chief Complaint  Patient presents with  . Hospitalization Follow-up  . Cardiomyopathy    In setting of sepsis  . Atrial Fibrillation    Persistent.  On Eliquis and amiodarone    HPI: Ronald [name] is a 71 y.o. male with a PMH below who presents today for hospital follow-up.  He has a history of persistent atrial fibrillation on amiodarone.  Ronald [name] was last seen in August and was doing relatively well.  2018 - he noted that he was doing very well with no active cardiac symptoms.   Recent Hospitalizations:   November 12-19th 2018: Admitted for sepsis.  Also noted to have acute combined heart failure with reduced ejection fraction on echo. -- was completely swollen  Studies Personally Reviewed - (if available, images/films reviewed: From Epic Chart or Care Everywhere)  2D echo February 22, 2017: Mild to moderately reduced EF 40-45%.  Diffuse hypokinesis.  No regional wall motion normality.  Moderate biatrial enlargement.  Interval History: Ronald [name] returns today starting to finally feel better now.  He is not noticing as much of the swelling or or PND/orthopnea but he had bleeding into his hospitalization.  He tells me that he actually started having some swelling and some orthopnea type symptoms with dyspnea and weight gain prior to going to the hospital.  He was quite surprised at how sick he became once he got to the hospital.  He did have a high fever but was otherwise not sure what was happening. His diltiazem had been discontinued be due to the low EF and he was started on carvedilol and seems to be having no recurrent A. fib episodes since hospitalization.  He has however now quit smoking since his hospitalization. Currently he is doing relatively well without any recurrent PND orthopnea or edema.  He has not had any further dyspnea with rest or exertion -above his baseline dyspnea.  He has never had any significant anginal chest pain  or pressure with rest or exertion.  No lightheadedness, dizziness, syncope/near syncope or TIA/amaurosis fugax. No claudication  ROS: A comprehensive was performed. Review of Systems  Constitutional: Positive for weight loss (With diuresis). Negative for malaise/fatigue.  HENT: Positive for congestion (and sunny nose).   Eyes: Negative for blurred vision.  Respiratory: Positive for cough and shortness of breath. Negative for wheezing.        Allergy related  Cardiovascular: Negative for palpitations.  Gastrointestinal: Negative for blood in stool, constipation, heartburn and melena.  Genitourinary: Negative for hematuria.  Musculoskeletal: Positive for joint pain.  Neurological: Negative for dizziness.  Endo/Heme/Allergies: Positive for environmental allergies. Bruises/bleeds easily.  Psychiatric/Behavioral: Negative.   All other systems reviewed and are negative.  I have reviewed and (if needed) personally updated the patient's problem list, medications, allergies, past medical and surgical history, social and family history.   Past Medical History:  Diagnosis Date  . Allergic rhinitis   . Anticoagulant long-term use    eliquis  . Bilateral hydrocele   . COPD with emphysema [facility])    pulmologist-  dr Halford Chessman  . Dyspnea    occasional per pt  . History of colon polyps    tubular adenoma 2013  . History of gout    last episode early June 2017-- (feet)  . Hyperlipidemia   . Hypertension   . Nocturia    flomax has improved symptoms  . OSA on CPAP    per study 08-03-2004  Severe  OSA  . Persistent atrial fibrillation [facility])    cardiologist --  paula ross--  post cardioversion 05-07-2014  . Prostate cancer [facility]) urologsit-  dr Alyson Ingles   Dx  2014--  stage T1c, Gleason 3+3=6, PSA 6.67--  Active surveillance  . 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 Seasonal allergies   . Sigmoid diverticulosis   . Tinea versicolor   .  Type 2 diabetes mellitus ([employer])   . Urgency of urination   . Wears glasses     Past Surgical History:  Procedure Laterality Date  . CARDIOVERSION N/A 05/07/2014   Procedure: CARDIOVERSION;  Surgeon: [name], MD;  Location: [facility] ENDOSCOPY;  Service: Cardiovascular;  Laterality: N/A;  . CARDIOVERSION N/A 09/09/2014   Procedure: CARDIOVERSION;  Surgeon: Lelon Perla, MD;  Location: [facility] ENDOSCOPY;  Service: Cardiovascular;  Laterality: N/A;  successfully  . COLONOSCOPY  last one 06-07-2011  . HYDROCELE EXCISION Bilateral 09/26/2015   Procedure: HYDROCELECTOMY ADULT;  Surgeon: [name], MD;  Location[facility];  Service: Urology;  Laterality: Bilateral;  . LAMINECTOMY AND MICRODISCECTOMY LUMBAR SPINE  12-23-2003   Left L5 -- S1  . LAPAROSCOPIC BILATERAL INGUINAL HERNIA REPAIR/  UMBILICAL HERNIA REPAIR WITH MESH/  ASPIRATION LEFT HYDROCELE  07-11-2012  . NM MYOVIEW LTD  05/18/2014   Low risk study. Normal perfusion: No ischemia or infarction. Mild LV dysfunction - 46% (does not correlate with echocardiographic EF of 50-55%)  . PROSTATE BIOPSY  05/15/12   Clinically both [place]  . TEE WITHOUT CARDIOVERSION N/A 05/07/2014   Procedure: TRANSESOPHAGEAL ECHOCARDIOGRAM (TEE);  Surgeon: [name], MD;  Location: [facility] ENDOSCOPY;  Service: Cardiovascular;  Laterality: N/A;   mild atherosclerosis plaque of aorta,  mild AR, MR, and TR,  no cardiac source of emboli  . TONSILLECTOMY  as child  . TRANSTHORACIC ECHOCARDIOGRAM  05/01/2014   mild LVH; EF 50-55% (cannot measure diastolic Fxn with Afib), Mod LA Dilation, mild MR  . TRANSTHORACIC ECHOCARDIOGRAM  02/2017   In setting of sepsis: Mild for mildly reduced EF of 40-45%.  Diffuse hypokinesis.  No regional wall motion of normality.  Biatrial enlargement.    Current Meds  Medication Sig  . acetaminophen (TYLENOL) 500 MG tablet Take 500 mg by mouth every 6 (six) hours as needed for mild pain or moderate pain.  Marland Kitchen albuterol  (VENTOLIN HFA) 108 (90 Base) MCG/ACT inhaler Inhale 2 puffs into the lungs every 6 (six) hours as needed for wheezing or shortness of breath.  . ALPRAZolam (XANAX) 0.25 MG tablet Take 1 tablet (0.25 mg total) at bedtime as needed by mouth for anxiety or sleep.  Marland Kitchen amiodarone (PACERONE) 200 MG tablet TAKE 1 TABLET (200 MG TOTAL) BY MOUTH DAILY.  Marland Kitchen apixaban (ELIQUIS) 5 MG TABS tablet Take 5 mg by mouth 2 (two) times daily.  Marland Kitchen atorvastatin (LIPITOR) 10 MG tablet TAKE 1 TABLET (10 MG TOTAL) BY MOUTH EVERY EVENING.  Marland Kitchen azelastine (ASTELIN) 0.1 % nasal spray Place 1 spray into both nostrils 2 (two) times daily. Use in each nostril as directed  . carvedilol (COREG) 6.25 MG tablet Take 1 tablet (6.25 mg total) 2 (two) times daily with a meal by mouth.  . colchicine 0.6 MG tablet Two tabs at onset of gout flare and then one tab twice daily as needed  . fluticasone (FLONASE) 50 MCG/ACT nasal spray Place 2 sprays into both nostrils daily as needed for allergies or rhinitis.  Marland Kitchen  furosemide (LASIX) 40 MG tablet Take 1 tablet (40 mg total) 2 (two) times daily by mouth. Take 1 tablet in the morning and second tablet at 2pm daily.  Marland Kitchen glipiZIDE (GLUCOTROL) 5 MG tablet Take 0.5 tablets (2.5 mg total) 2 (two) times daily by mouth.  . hydrocortisone (ANUSOL-HC) 2.5 % rectal cream Apply 2 (two) times daily topically.  . Lancets (ACCU-CHEK SOFT TOUCH) lancets Check blood sugars once per day. DX: E11.9  . montelukast (SINGULAIR) 10 MG tablet Take 1 tablet (10 mg total) by mouth at bedtime.  . nicotine (NICODERM CQ - DOSED IN MG/24 HOURS) 21 mg/24hr patch Place 1 patch (21 mg total) daily onto the skin.  . potassium chloride SA (K-DUR,KLOR-CON) 20 MEQ tablet Take 2 tablets (40 mEq total) daily by mouth.  . tamsulosin (FLOMAX) 0.4 MG CAPS capsule Take 0.4 mg by mouth daily after supper.     No Known Allergies  Social History   Socioeconomic History  . Marital status: Married    Spouse name: None  . Number of children:  None  . Years of education: None  . Highest education level: None  Social Needs  . Financial resource strain: None  . Food insecurity - worry: None  . Food insecurity - inability: None  . Transportation needs - medical: None  . Transportation needs - non-medical: None  Occupational History  . Occupation: Scientist, clinical (histocompatibility and immunogenetics): [employer]  Tobacco Use  . Smoking status: Former Smoker    Packs/day: 1.00    Years: 55.00    Pack years: 55.00    Types: Cigarettes    Last attempt to quit: 02/18/2017    Years since quitting: 0.0  . Smokeless tobacco: Never Used  . Tobacco comment: trying to quit  mid Jan 2017 - as of 09-20-2015  down to , per pt 2-3 cigarettes per day  Substance and Sexual Activity  . Alcohol use: Yes    Comment: daily  . Drug use: No  . Sexual activity: None  Other Topics Concern  . None  Social History Narrative   Married 25 years   2 children but not with this wife    family history includes Breast cancer in his mother; Ovarian cancer in his sister; Stroke in his unknown relative.  Wt Readings from Last 3 Encounters:  03/08/17 226 lb 9.6 oz (102.8 kg)  03/08/17 227 lb (103 kg)  03/07/17 224 lb 12.8 oz (102 kg)    PHYSICAL EXAM BP 120/82   Pulse 60   Ht _0  (1.803 m)   Wt 224 lb 12.8 oz (102 kg)   BMI 31.35 kg/m  Physical Exam  Constitutional: He is oriented to person, place, and time. He appears well-developed and well-nourished. No distress.  Well groomed.   HENT:  Head: Normocephalic and atraumatic.  Mouth/Throat: Oropharynx is clear and moist.  Neck: Normal range of motion. Normal carotid pulses, no hepatojugular reflux and no JVD present. Carotid bruit is not present.  Cardiovascular: Normal rate, regular rhythm, normal heart sounds and intact distal pulses. Exam reveals no gallop and no friction rub.  No murmur heard. Pulmonary/Chest: Effort normal and breath sounds normal. No respiratory distress. He has no wheezes. He has no rales.    Abdominal: Soft. Bowel sounds are normal. He exhibits no distension. There is no tenderness. There is no rebound.  Musculoskeletal: Normal range of motion. He exhibits edema (trace bilaeral ankle swelling).  Neurological: He is alert and oriented to person, place, and  time. No cranial nerve deficit.  Skin: Skin is warm and dry. No rash noted. No erythema.  tan  Psychiatric: He has a normal mood and affect. His behavior is normal. Judgment and thought content normal.  Nursing note and vitals reviewed.    Adult ECG Report Not checked  Other studies Reviewed: Additional studies/ records that were reviewed today include:  Recent Labs: He is due for PCP to check his lipids soon.  Lab Results  Component Value Date   CHOL 144 06/07/2016   HDL 46 06/07/2016   LDLCALC 62 06/07/2016   LDLDIRECT 57.8 03/10/2012   TRIG 179 (H) 06/07/2016   CHOLHDL 3.1 06/07/2016     ASSESSMENT / PLAN: Problem List Items Addressed This Visit    Acute systolic CHF (congestive heart failure) (HCC) - Primary    It is hard to tell what came first, but it would seem that his heart failure may have predated his sepsis.  Cannot be sure.  He did have a drop in EF.  Currently seems euvolemic but is on 40 mg twice daily Lasix.  He is already on carvedilol have been switched in the hospital.  I will add low-dose losartan for afterload reduction.  I have asked to add a BNP level to his upcoming lab draw by PCP.  This will tell us is now baseline euvolemic BNP  We discussed sliding scale Lasix for weight gain greater than 3 pounds. He is in the process of trying to lose weight which makes it difficult, but I have asked that he just adjust his dry weight based on an average weight for several days.  He is weight at home is been roughly 220 pounds.      Relevant Medications   losartan (COZAAR) 25 MG tablet   Other Relevant Orders   Brain natriuretic peptide   ECHOCARDIOGRAM COMPLETE   Atrial fibrillation,  persistent ([place]):  CHA2DS2-VASc Score - On Eliquis (Chronic)    Back now in sinus rhythm maintaining sinus rhythm with amiodarone and beta-blocker as opposed diltiazem for rate control.  Need to monitor for bradycardia as he has had history of bradycardia with beta-blockers. No bleeding issues on Eliqus for anticoagulation  Probably not a good time to reassess pulmonary function test based on his recent hospitalization with possible pneumonia.  Can wait for another year.      Relevant Medications   losartan (COZAAR) 25 MG tablet   Other Relevant Orders   ECHOCARDIOGRAM COMPLETE   Cardiomyopathy due to systemic disease ([place])    I suspect that his cardia myopathy may very well have been in the setting of acute illness, that was the thought from the cardiology team in the hospital. I would like for him to be on optimize medical management with carvedilol and losartan and then recheck an echo in roughly January timeframe.  In light of his upcoming planned surgery, I would like to see his EF having improved back to baseline prior to preoperative evaluation.  I can then see him after the echo for preop.  He is already on carvedilol and I have added losartan.      Relevant Medications   losartan (COZAAR) 25 MG tablet   Chronic anticoagulation (Chronic)    Doing well on Eliquis      Cigarette smoker two packs a day or less (Chronic)    He finally has quit smoking.  This recent hospital stay basically convinced him that it is time.  I congratulated him on his efforts.  Edema of both legs    He has had some venous insufficiency in the past, but now has a combination with CHF as well.  He is now on an increased dose of Lasix.  Again I recommended support stockings.      Essential hypertension (Chronic)    He had been on an ACE inhibitor in the past, and I think that was stopped during the hospitalization.  I have started losartan as an ARB in addition to carvedilol.  Blood pressure looks  stable.      Relevant Medications   losartan (COZAAR) 25 MG tablet   Exertional dyspnea (Chronic)    Seems to be back to his baseline level of exertional dyspnea, indicating likely euvolemic heart failure. I suspect he has some component of COPD.      Hyperlipidemia with target LDL less than 70 (Chronic)    He is on low-dose atorvastatin.  Due to have labs checked by PCP soon.  Last LDL looked good in March.      Relevant Medications   losartan (COZAAR) 25 MG tablet   Hypokalemia   Relevant Orders   Brain natriuretic peptide   On amiodarone therapy (Chronic)    LFTs and TFTs being followed by PCP. PFTs do show abnormal pulmonary function, but would recommend checking ESR and CRP to evaluate for any amiodarone toxicity.  Most recent ESR was well within normal range.      Preoperative cardiovascular examination    He has possible surgery upcoming, and asked about potentially being cleared for surgery.  I would like to see what is echo looks like in follow-up.  If indeed his EF has improved and back to baseline, I would suspect that it was related to his acute illness and would be happy to "clear for surgical.  However if the EF remains down I would be more inclined to check an ischemic evaluation with a coronary CT angiogram.      Relevant Orders   Brain natriuretic peptide      Current medicines are reviewed at length with the patient today. (+/- concerns) none The following changes have been made:See below  Patient Instructions  MEDICATIONS INSTRUCTION  ---START  LOSARTAN 25 MG  ONE TABLET  DAILY   ---  CONTINUE WITH FUROSEMIDE ( LASIX ) DOSE   40 MG  TWICE A DAY , IF  WEIGTH IS ABOVE YOUR DRY WEIGHT 224 LBS --take an additional 40 mg  Daily until you return to 224 lbs    Labs BNP -- WITH  PCP  OTHER LAB WORK     SCHEDULE AT [facility] has requested that you have an echocardiogram IN JAN 2019. Echocardiography is a painless  test that uses sound waves to create images of your heart. It provides your doctor with information about the size and shape of your heart and how well your heart's chambers and valves are working. This procedure takes approximately one hour. There are no restrictions for this procedure.   Your physician recommends that you schedule a follow-up appointment in: JAN/FEB 2019 AFTER ECHO WITH DR Tymon Nemetz.      Studies Ordered:   Orders Placed This Encounter  Procedures  . Brain natriuretic peptide  . ECHOCARDIOGRAM COMPLETE      Glenetta Hew, M.D., M.S. Interventional Cardiologist   Pager # [phone] Phone # [phone] [address] [place], [place] [address]

## 2017-03-07 NOTE — Patient Instructions (Addendum)
MEDICATIONS INSTRUCTION  ---START  LOSARTAN 25 MG  ONE TABLET  DAILY   ---  CONTINUE WITH FUROSEMIDE ( LASIX ) DOSE   40 MG  TWICE A DAY , IF  WEIGTH IS ABOVE YOUR DRY WEIGHT 224 LBS --take an additional 40 mg  Daily until you return to 224 lbs    Labs BNP -- WITH  PCP  OTHER LAB WORK     SCHEDULE AT [place] has requested that you have an echocardiogram IN JAN 2019. Echocardiography is a painless test that uses sound waves to create images of your heart. It provides your doctor with information about the size and shape of your heart and how well your heart's chambers and valves are working. This procedure takes approximately one hour. There are no restrictions for this procedure.   Your physician recommends that you schedule a follow-up appointment in: JAN/FEB 2019 AFTER ECHO WITH DR HARDING.

## 2017-03-08 ENCOUNTER — Ambulatory Visit (INDEPENDENT_AMBULATORY_CARE_PROVIDER_SITE_OTHER): Payer: Federal, State, Local not specified - PPO | Admitting: Family Medicine

## 2017-03-08 ENCOUNTER — Encounter: Payer: Self-pay | Admitting: Physician Assistant

## 2017-03-08 ENCOUNTER — Other Ambulatory Visit (INDEPENDENT_AMBULATORY_CARE_PROVIDER_SITE_OTHER): Payer: Federal, State, Local not specified - PPO

## 2017-03-08 ENCOUNTER — Encounter: Payer: Self-pay | Admitting: Family Medicine

## 2017-03-08 ENCOUNTER — Ambulatory Visit (INDEPENDENT_AMBULATORY_CARE_PROVIDER_SITE_OTHER): Payer: Federal, State, Local not specified - PPO | Admitting: Physician Assistant

## 2017-03-08 VITALS — BP 102/64 | HR 76 | Temp 98.5°F | Wt 226.6 lb

## 2017-03-08 VITALS — BP 118/82 | HR 72 | Ht 71.0 in | Wt 227.0 lb

## 2017-03-08 DIAGNOSIS — N179 Acute kidney failure, unspecified: Secondary | ICD-10-CM | POA: Diagnosis not present

## 2017-03-08 DIAGNOSIS — R933 Abnormal findings on diagnostic imaging of other parts of digestive tract: Secondary | ICD-10-CM

## 2017-03-08 DIAGNOSIS — Z8679 Personal history of other diseases of the circulatory system: Secondary | ICD-10-CM | POA: Diagnosis not present

## 2017-03-08 DIAGNOSIS — N289 Disorder of kidney and ureter, unspecified: Secondary | ICD-10-CM | POA: Diagnosis not present

## 2017-03-08 LAB — CBC WITH DIFFERENTIAL/PLATELET
Basophils Absolute: 0.1 10*3/uL (ref 0.0–0.1)
Basophils Relative: 0.8 % (ref 0.0–3.0)
EOS ABS: 0.1 10*3/uL (ref 0.0–0.7)
Eosinophils Relative: 1.3 % (ref 0.0–5.0)
HCT: 47.4 % (ref 39.0–52.0)
HEMOGLOBIN: 16 g/dL (ref 13.0–17.0)
Lymphocytes Relative: 24.3 % (ref 12.0–46.0)
Lymphs Abs: 1.7 10*3/uL (ref 0.7–4.0)
MCHC: 33.7 g/dL (ref 30.0–36.0)
MCV: 97.7 fl (ref 78.0–100.0)
MONO ABS: 0.6 10*3/uL (ref 0.1–1.0)
Monocytes Relative: 8.2 % (ref 3.0–12.0)
Neutro Abs: 4.5 10*3/uL (ref 1.4–7.7)
Neutrophils Relative %: 65.4 % (ref 43.0–77.0)
Platelets: 239 10*3/uL (ref 150.0–400.0)
RBC: 4.86 Mil/uL (ref 4.22–5.81)
RDW: 12.9 % (ref 11.5–15.5)
WBC: 6.9 10*3/uL (ref 4.0–10.5)

## 2017-03-08 LAB — COMPREHENSIVE METABOLIC PANEL
ALBUMIN: 4 g/dL (ref 3.5–5.2)
ALK PHOS: 84 U/L (ref 39–117)
ALT: 43 U/L (ref 0–53)
AST: 28 U/L (ref 0–37)
BILIRUBIN TOTAL: 0.9 mg/dL (ref 0.2–1.2)
BUN: 21 mg/dL (ref 6–23)
CO2: 32 mEq/L (ref 19–32)
CREATININE: 1.75 mg/dL — AB (ref 0.40–1.50)
Calcium: 9.5 mg/dL (ref 8.4–10.5)
Chloride: 101 mEq/L (ref 96–112)
GFR: 40.95 mL/min — ABNORMAL LOW (ref >60.00)
Glucose, Bld: 122 mg/dL — ABNORMAL HIGH (ref 70–99)
Potassium: 5 mEq/L (ref 3.5–5.1)
Sodium: 138 mEq/L (ref 135–145)
TOTAL PROTEIN: 6.7 g/dL (ref 6.0–8.3)

## 2017-03-08 NOTE — Progress Notes (Signed)
Subjective:    Patient ID: Ronald [name], male    DOB: 01/17/1946, 71 y.o.   MRN: [id number]  HPI Ronald [name] is a very nice 71 year old white male, known previously to Dr. Fuller Plan who comes in today, referred by Dr. Elease Hashimoto for evaluation of abnormal CT scan. Patient has history of hypertension, atrial fibrillation for which she is on Eliquis, congestive heart failure, prostate cancer, obstructive sleep apnea and adult onset diabetes mellitus. He had undergone colonoscopy in May 2018 for follow-up and this showed a total of 6 polyps all of which were removed, multiple diverticuli and internal hemorrhoids.  Path on the polyps returned showing tubular adenomas and hyperplastic polyps. Patient had a recent admission on 02/18/2017 when he presented with fever chills fatigue and had had 2 episodes of noting dark and red blood with a loose bowel movement that day. Patient had had a prostate biopsy a couple of weeks prior to this admission and had resumed his Eliquis. Believe his cultures were all negative but it was felt he may have had a urinary tract infection.  He did develop acute congestive heart failure and acute renal insufficiency during that admission.  He was felt to be significantly volume overloaded on admission and has been diuresing.  He had an echo done on the 18th shows an EF of 40-45% with diffuse hypokinesis. He is been seen in follow-up with cardiology since discharge, he was to continue diuretics and to have repeat echo in January.  Ultimately he needs to have prostate surgery but that is being delayed a bit to allow his cardiac function to recuperate. During his admission he had CT of the abdomen and pelvis done the cause of the fever chills and small volume GI bleeding.  This was done without IV contrast due to renal insufficiency, and showed no evidence of complication relating to his recent prostate biopsy, there was a 7-8 cm segment of small bowel in the central portion questionably  abnormal and could be secondary to inflammatory disease or mass lesion.  CT enterography was recommended.  There was also some edema in the fat around the gallbladder and prominence of the left lobe of the liver which could suggest early cirrhosis. Patient states that he has not had any abdominal pain through his entire illness and feels much better at this point.  His appetite has improved.  He is lost a significant amount of weight with diuresis and says he is also been trying to eat a lot better.  No complaints of nausea or vomiting.  No fever since discharge.  Bowel movements have been normal and he has not noted any melena or hematochezia.  Review of Systems Pertinent positive and negative review of systems were noted in the above HPI section.  All other review of systems was otherwise negative.  Outpatient Encounter Medications as of 03/08/2017  Medication Sig  . acetaminophen (TYLENOL) 500 MG tablet Take 500 mg by mouth every 6 (six) hours as needed for mild pain or moderate pain.  Marland Kitchen albuterol (VENTOLIN HFA) 108 (90 Base) MCG/ACT inhaler Inhale 2 puffs into the lungs every 6 (six) hours as needed for wheezing or shortness of breath.  . ALPRAZolam (XANAX) 0.25 MG tablet Take 1 tablet (0.25 mg total) at bedtime as needed by mouth for anxiety or sleep.  Marland Kitchen amiodarone (PACERONE) 200 MG tablet TAKE 1 TABLET (200 MG TOTAL) BY MOUTH DAILY.  Marland Kitchen apixaban (ELIQUIS) 5 MG TABS tablet Take 5 mg by mouth 2 (two) times daily.  Marland Kitchen  atorvastatin (LIPITOR) 10 MG tablet TAKE 1 TABLET (10 MG TOTAL) BY MOUTH EVERY EVENING.  Marland Kitchen azelastine (ASTELIN) 0.1 % nasal spray Place 1 spray into both nostrils 2 (two) times daily. Use in each nostril as directed  . carvedilol (COREG) 6.25 MG tablet Take 1 tablet (6.25 mg total) 2 (two) times daily with a meal by mouth.  . colchicine 0.6 MG tablet Two tabs at onset of gout flare and then one tab twice daily as needed  . fluticasone (FLONASE) 50 MCG/ACT nasal spray Place 2 sprays  into both nostrils daily as needed for allergies or rhinitis.  . furosemide (LASIX) 40 MG tablet Take 1 tablet (40 mg total) 2 (two) times daily by mouth. Take 1 tablet in the morning and second tablet at 2pm daily.  Marland Kitchen glipiZIDE (GLUCOTROL) 5 MG tablet Take 0.5 tablets (2.5 mg total) 2 (two) times daily by mouth.  . hydrocortisone (ANUSOL-HC) 2.5 % rectal cream Apply 2 (two) times daily topically.  . Lancets (ACCU-CHEK SOFT TOUCH) lancets Check blood sugars once per day. DX: E11.9  . losartan (COZAAR) 25 MG tablet Take 1 tablet (25 mg total) by mouth daily.  . montelukast (SINGULAIR) 10 MG tablet Take 1 tablet (10 mg total) by mouth at bedtime.  . nicotine (NICODERM CQ - DOSED IN MG/24 HOURS) 21 mg/24hr patch Place 1 patch (21 mg total) daily onto the skin.  . potassium chloride SA (K-DUR,KLOR-CON) 20 MEQ tablet Take 2 tablets (40 mEq total) daily by mouth.  . tamsulosin (FLOMAX) 0.4 MG CAPS capsule Take 0.4 mg by mouth daily after supper.    No facility-administered encounter medications on file as of 03/08/2017.    No Known Allergies Patient Active Problem List   Diagnosis Date Noted  . Preoperative cardiovascular examination 03/07/2017  . Hypervolemia   . Acute systolic CHF (congestive heart failure) ([place])   . Hypoxia 02/21/2017  . ARF (acute renal failure) ([place])   . Hypomagnesemia 02/20/2017  . GI bleed 02/20/2017  . Severe sepsis ([place]) 02/19/2017  . History of colonic polyps 07/03/2016  . Chronic anticoagulation 07/03/2016  . Hyperglycemia, drug-induced 04/05/2015  . 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 ([place]) 02/21/2015  . On amiodarone therapy 12/08/2014  . Edema of both legs 12/08/2014  . Exertional dyspnea 08/18/2014  . Hypokalemia   . Tobacco abuse   . Atrial fibrillation, persistent ([place]):  CHA2DS2-VASc Score - On Eliquis 05/04/2014  . Cigarette smoker two packs a day or less   . Prostate cancer ([place]) 10/15/2013  . Obesity (BMI 30-39.9)  04/17/2013  . Umbilical hernia 34/19/6222  . Right inguinal hernia 06/23/2012  . Hydrocele 06/19/2012  . Stage T1c Adenocarcinoma of the Prostate with a Gleason's Score of 3+3 and a PSA of 6.63 - Favorable Risk 06/02/2012  . Metabolic syndrome 97/98/9211  . Type 2 diabetes mellitus ([place]) 03/20/2012  . Elevated PSA 03/20/2012  . GOUT, UNSPECIFIED 10/07/2009  . TINEA VERSICOLOR 07/19/2009  . PERS HX TOBACCO USE PRESENTING HAZARDS HEALTH 07/19/2009  . Obstructive sleep apnea 07/02/2008  . Hyperlipidemia 07/01/2008  . Essential hypertension 07/01/2008  . ALLERGIC RHINITIS 07/01/2008   Social History   Socioeconomic History  . Marital status: Married    Spouse name: Not on file  . Number of children: Not on file  . Years of education: Not on file  . Highest education level: Not on file  Social Needs  . Financial resource strain: Not on file  . Food insecurity - worry: Not on file  .  Food insecurity - inability: Not on file  . Transportation needs - medical: Not on file  . Transportation needs - non-medical: Not on file  Occupational History  . Occupation: Scientist, clinical (histocompatibility and immunogenetics): [employer]  Tobacco Use  . Smoking status: Former Smoker    Packs/day: 1.00    Years: 55.00    Pack years: 55.00    Types: Cigarettes    Last attempt to quit: 02/18/2017    Years since quitting: 0.0  . Smokeless tobacco: Never Used  . Tobacco comment: trying to quit  mid Jan 2017 - as of 09-20-2015  down to , per pt 2-3 cigarettes per day  Substance and Sexual Activity  . Alcohol use: Yes    Comment: daily  . Drug use: No  . Sexual activity: Not on file  Other Topics Concern  . Not on file  Social History Narrative   Married 25 years   2 children but not with this wife    Mr. Clyne family history includes Breast cancer in his mother; Ovarian cancer in his sister; Stroke in his unknown relative.      Objective:    Vitals:   03/08/17 0945  BP: 118/82  Pulse: 72  SpO2: 94%     Physical Exam; Well-developed older white male in no acute distress, very pleasant accompanied by his wife.  Blood pressure 118/82 pulse 72, BMI 31.6.  HEENT; nontraumatic normocephalic EOMI PERRLA sclera anicteric, Cardiovascular; regular rate and rhythm with S1-S2, Pulmonary; clear bilaterally, Abdomen; soft, nontender nondistended bowel sounds are active there is no palpable mass or hepatosplenomegaly, Rectal; exam not done, Extremities ;no clubbing cyanosis or edema skin warm and dry, Neuro psych; mood and affect appropriate       Assessment & Plan:   #36 71 year old white male with hospital admission about 3 weeks ago with a sepsis picture, and small volume rectal bleeding on the day of admission.  Patient did also had recent prostate biopsy and is anticoagulated. He was felt to have a urinary tract infection.  He also had acute congestive heart failure during that admission and echo showed EF of 40-45%. He is improving and has been diuresing. CT of the abdomen and pelvis done as part of that workup showed a prominent left lobe of the liver rule out early cirrhosis and a 7-8 cm segment of mid small bowel which appeared abnormal rule out inflammatory process versus mass. Patient has no GI symptoms. #2 acute kidney injury felt secondary to acute congestive heart failure, patient continues on diuretics #3 atrial fibrillation 4.  Chronic anticoagulation with Eliquis 5.  Prostate cancer-workup in progress and plan is for surgery once his cardiac function is maximized. 6.  History of adenomatous colon polyps-colonoscopy was just done in May 2018.  Plan;  Will check BMET today Schedule for CT abdomen with Enterography with IV  And oral contrast. If kidney function has not normalized, this may need to be delayed a few more weeks. Further GI plans pending results of Enterography.  Amy S Esterwood PA-C 03/08/2017   Cc: [name], MD

## 2017-03-08 NOTE — Patient Instructions (Signed)
Remember to get follow up blood work in 2 weeks.

## 2017-03-08 NOTE — Progress Notes (Signed)
Reviewed and agree with management plan.  Estiben Mizuno T. Dorena Dorfman, MD FACG 

## 2017-03-08 NOTE — Progress Notes (Signed)
Subjective:     Patient ID: Ronald [name], male   DOB: Dec 14, 1945, 71 y.o.   MRN: [id number]  HPI Patient seen for hospital follow-up. He has history of hypertension, arial fibrillation, systolic heart failure, obstructive sleep apnea, type 2 diabetes, prostate cancer, nicotine use, metabolic syndrome who was admitted with diagnosis of sepsis on 02/18/17.  He presented to ER date admission with fevers and chills and apparently had some blood in his stools with some loose stools earlier in the day. He had previous prostate biopsy per urology about 3 weeks prior to admission. He denied any dysuria. He reported bloody bowel movement prior to admission and then developed the fevers and chills. Fever 103 in the ER and initial lactate was normal within increased on follow-up. Blood cultures and urine cultures remained negative. Influenza screen was negative. Started on empiric antibiotics. Chest x-ray showed no infiltrates. Not clear whether his sepsis picture was related to urologic issues versus GI source.  All his cultures remain negative. CT abdomen and pelvis showed no evidence for complications from recent biopsy but he did have 7-8 cm segment of small bowel central portion which showed some questional abnormal thickening. CT enterography was recommended. He has seen GI since then and they had scheduled that procedure if his kidney function is improved.  Patient had hypoxia, volume overload, and acute systolic heart failure during hospitalization. He was diuresed and improved. Apparently diuresed 6.6 L. Discharged on Lasix 40 mg twice daily. Recent echo ejection fraction 40-45%. Home weight currently about 219 pounds. Chest x-ray showed picture consistent with volume overload. Cardiac enzymes were negative.  Patient was changed from diltiazem to carvedilol. Feels stable from cardiac standpoint at this time. No orthopnea. No peripheral edema.  History of A. fib on chronic anticoagulation. Type 2  diabetes. Was taken off metformin secondary to acute kidney injury. Discharge creatinine 1.75.  started on low-dose glipizide. Blood sugar stable. No hypoglycemia.  Past Medical History:  Diagnosis Date  . Allergic rhinitis   . Anticoagulant long-term use    eliquis  . Bilateral hydrocele   . COPD with emphysema [facility])    pulmologist-  dr Halford Chessman  . Dyspnea    occasional per pt  . History of colon polyps    tubular adenoma 2013  . History of gout    last episode early June 2017-- (feet)  . Hyperlipidemia   . Hypertension   . Nocturia    flomax has improved symptoms  . OSA on CPAP    per study 08-03-2004  Severe OSA  . Persistent atrial fibrillation [facility])    cardiologist --  paula ross--  post cardioversion 05-07-2014  . Prostate cancer [facility]) urologsit-  dr Alyson Ingles   Dx  2014--  stage T1c, Gleason 3+3=6, PSA 6.67--  Active surveillance  . 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 Seasonal allergies   . Sigmoid diverticulosis   . Tinea versicolor   . Type 2 diabetes mellitus ([place])   . Urgency of urination   . Wears glasses    Past Surgical History:  Procedure Laterality Date  . CARDIOVERSION N/A 05/07/2014   Procedure: CARDIOVERSION;  Surgeon: [name], MD;  Location: [facility] ENDOSCOPY;  Service: Cardiovascular;  Laterality: N/A;  . CARDIOVERSION N/A 09/09/2014   Procedure: CARDIOVERSION;  Surgeon: Lelon Perla, MD;  Location: [facility] ENDOSCOPY;  Service: Cardiovascular;  Laterality: N/A;  successfully  . COLONOSCOPY  last one 06-07-2011  . HYDROCELE  EXCISION Bilateral 09/26/2015   Procedure: HYDROCELECTOMY ADULT;  Surgeon: [name], MD;  Location: [facility];  Service: Urology;  Laterality: Bilateral;  . LAMINECTOMY AND MICRODISCECTOMY LUMBAR SPINE  12-23-2003   Left L5 -- S1  . LAPAROSCOPIC BILATERAL INGUINAL HERNIA REPAIR/  UMBILICAL HERNIA REPAIR WITH MESH/  ASPIRATION LEFT HYDROCELE  07-11-2012   . NM MYOVIEW LTD  05/18/2014   Low risk study. Normal perfusion: No ischemia or infarction. Mild LV dysfunction - 46% (does not correlate with echocardiographic EF of 50-55%)  . PROSTATE BIOPSY  05/15/12   Clinically both Lobes  . TEE WITHOUT CARDIOVERSION N/A 05/07/2014   Procedure: TRANSESOPHAGEAL ECHOCARDIOGRAM (TEE);  Surgeon: [name], MD;  Location: [facility] ENDOSCOPY;  Service: Cardiovascular;  Laterality: N/A;   mild atherosclerosis plaque of aorta,  mild AR, MR, and TR,  no cardiac source of emboli  . TONSILLECTOMY  as child  . TRANSTHORACIC ECHOCARDIOGRAM  05/01/2014   mild LVH; EF 50-55% (cannot measure diastolic Fxn with Afib), Mod LA Dilation, mild MR    reports that he quit smoking about 2 weeks ago. His smoking use included cigarettes. He has a 55.00 pack-year smoking history. he has never used smokeless tobacco. He reports that he drinks alcohol. He reports that he does not use drugs. family history includes Breast cancer in his mother; Ovarian cancer in his sister; Stroke in his unknown relative. No Known Allergies\   Review of Systems  Constitutional: Positive for fatigue.  Eyes: Negative for visual disturbance.  Respiratory: Negative for cough, chest tightness and shortness of breath.   Cardiovascular: Negative for chest pain, palpitations and leg swelling.  Neurological: Negative for dizziness, syncope, weakness, light-headedness and headaches.       Objective:   Physical Exam  Constitutional: He is oriented to person, place, and time. He appears well-developed and well-nourished.  HENT:  Right Ear: External ear normal.  Left Ear: External ear normal.  Mouth/Throat: Oropharynx is clear and moist.  Eyes: Pupils are equal, round, and reactive to light.  Neck: Neck supple. No thyromegaly present.  Cardiovascular: Normal rate.  Pulmonary/Chest: Effort normal and breath sounds normal. No respiratory distress. He has no wheezes. He has no rales.  Musculoskeletal: He  exhibits no edema.  Neurological: He is alert and oriented to person, place, and time. No cranial nerve deficit.       Assessment:     #1 recent sepsis with source unknown with cultures negative  #2 acute kidney injury with discharge creatinine 1.8  #3 acute systolic heart failure  #4 history of chronic atrial fibrillation on anticoagulation  #5 history of recent GI bleeding with abnormal CT scan as above  #6 type 2 diabetes with recent discontinuation of metformin and change to glipizide    Plan:     -Reviewed labs or done earlier today. Pro BNP level still pending -Continue fluid restriction of 2 L per day and continue daily weights -Future lab for repeat basic metabolic panel in 2 weeks -continue to hold metformin secondary to AKI.  [name] MD [facility] at [facility]

## 2017-03-08 NOTE — Patient Instructions (Signed)
Please go to the basement level to have your labs drawn.    You have been scheduled for a CT scan of the abdomen and pelvis at [facility].   You are scheduled on Friday 03-15-2017 at 1:30 PM. You should arrive at 12:15 PM15  to your appointment time for registration. Please follow the written instructions below on the day of your exam:  WARNING: IF YOU ARE ALLERGIC TO IODINE/X-RAY DYE, PLEASE NOTIFY RADIOLOGY IMMEDIATELY AT [phone]! YOU WILL BE GIVEN A 13 HOUR PREMEDICATION PREP.  1) Do not eat or drink anything after 9:30 am (4 hours prior to your test) They will give you contrast to drink when you get there.  You may take any medications as prescribed with a small amount of water except for the following: Metformin, Glucophage, Glucovance, Avandamet, Riomet, Fortamet, Actoplus Met, Janumet, Glumetza or Metaglip. The above medications must be held the day of the exam AND 48 hours after the exam.  The purpose of you drinking the oral contrast is to aid in the visualization of your intestinal tract. The contrast solution may cause some diarrhea. Before your exam is started, you will be given a small amount of fluid to drink. Depending on your individual set of symptoms, you may also receive an intravenous injection of x-ray contrast/dye. Plan on being at [facility] for 30 minutes or long, depending on the type of exam you are having performed.  If you have any questions regarding your exam or if you need to reschedule, you may call the CT department at [phone] between the hours of 8:00 am and 5:00 pm, Monday-Friday.  ________________________________________________________________________

## 2017-03-09 ENCOUNTER — Encounter: Payer: Self-pay | Admitting: Cardiology

## 2017-03-09 DIAGNOSIS — I43 Cardiomyopathy in diseases classified elsewhere: Secondary | ICD-10-CM | POA: Insufficient documentation

## 2017-03-09 DIAGNOSIS — I429 Cardiomyopathy, unspecified: Secondary | ICD-10-CM

## 2017-03-09 NOTE — Assessment & Plan Note (Signed)
He had been on an ACE inhibitor in the past, and I think that was stopped during the hospitalization.  I have started losartan as an ARB in addition to carvedilol.  Blood pressure looks stable.

## 2017-03-09 NOTE — Assessment & Plan Note (Signed)
He is on low-dose atorvastatin.  Due to have labs checked by PCP soon.  Last LDL looked good in March.

## 2017-03-09 NOTE — Assessment & Plan Note (Signed)
He finally has quit smoking.  This recent hospital stay basically convinced him that it is time.  I congratulated him on his efforts.

## 2017-03-09 NOTE — Assessment & Plan Note (Addendum)
It is hard to tell what came first, but it would seem that his heart failure may have predated his sepsis.  Cannot be sure.  He did have a drop in EF.  Currently seems euvolemic but is on 40 mg twice daily Lasix.  He is already on carvedilol have been switched in the hospital.  I will add low-dose losartan for afterload reduction.  I have asked to add a BNP level to his upcoming lab draw by PCP.  This will tell us is now baseline euvolemic BNP  We discussed sliding scale Lasix for weight gain greater than 3 pounds. He is in the process of trying to lose weight which makes it difficult, but I have asked that he just adjust his dry weight based on an average weight for several days.  He is weight at home is been roughly 220 pounds.

## 2017-03-09 NOTE — Assessment & Plan Note (Signed)
LFTs and TFTs being followed by PCP. PFTs do show abnormal pulmonary function, but would recommend checking ESR and CRP to evaluate for any amiodarone toxicity.  Most recent ESR was well within normal range.

## 2017-03-09 NOTE — Assessment & Plan Note (Signed)
Back now in sinus rhythm maintaining sinus rhythm with amiodarone and beta-blocker as opposed diltiazem for rate control.  Need to monitor for bradycardia as he has had history of bradycardia with beta-blockers. No bleeding issues on Eliqus for anticoagulation  Probably not a good time to reassess pulmonary function test based on his recent hospitalization with possible pneumonia.  Can wait for another year.

## 2017-03-09 NOTE — Assessment & Plan Note (Signed)
I suspect that his cardia myopathy may very well have been in the setting of acute illness, that was the thought from the cardiology team in the hospital. I would like for him to be on optimize medical management with carvedilol and losartan and then recheck an echo in roughly January timeframe.  In light of his upcoming planned surgery, I would like to see his EF having improved back to baseline prior to preoperative evaluation.  I can then see him after the echo for preop.  He is already on carvedilol and I have added losartan.

## 2017-03-09 NOTE — Assessment & Plan Note (Signed)
He has possible surgery upcoming, and asked about potentially being cleared for surgery.  I would like to see what is echo looks like in follow-up.  If indeed his EF has improved and back to baseline, I would suspect that it was related to his acute illness and would be happy to "clear for surgical.  However if the EF remains down I would be more inclined to check an ischemic evaluation with a coronary CT angiogram.

## 2017-03-09 NOTE — Assessment & Plan Note (Signed)
He has had some venous insufficiency in the past, but now has a combination with CHF as well.  He is now on an increased dose of Lasix.  Again I recommended support stockings.

## 2017-03-09 NOTE — Assessment & Plan Note (Signed)
Doing well on Eliquis. 

## 2017-03-09 NOTE — Assessment & Plan Note (Signed)
Seems to be back to his baseline level of exertional dyspnea, indicating likely euvolemic heart failure. I suspect he has some component of COPD.

## 2017-03-12 ENCOUNTER — Other Ambulatory Visit: Payer: Self-pay

## 2017-03-12 ENCOUNTER — Telehealth: Payer: Self-pay | Admitting: Physician Assistant

## 2017-03-12 DIAGNOSIS — I5021 Acute systolic (congestive) heart failure: Secondary | ICD-10-CM

## 2017-03-12 NOTE — Telephone Encounter (Signed)
Patient wife calling about peptide lab results from 11.31.18 and wanting to know when they will get them.

## 2017-03-12 NOTE — Telephone Encounter (Signed)
Left message for patient that unfortunately the lab did not draw this test. Asked that they come back to either our lab or cardiologist office to have this done.

## 2017-03-12 NOTE — Telephone Encounter (Signed)
There are orders in computer for a BNP  Twice - not sure why lab did not draw!Marland KitchenMarland Kitchen He just needs a regular beta naturetic peptide  Done - mat have to come back to lab and use the order from 11/29

## 2017-03-12 NOTE — Telephone Encounter (Signed)
It does not look like this was ever collected, I am not sure why. Do you still need this?

## 2017-03-13 ENCOUNTER — Other Ambulatory Visit: Payer: Self-pay | Admitting: *Deleted

## 2017-03-13 DIAGNOSIS — I5021 Acute systolic (congestive) heart failure: Secondary | ICD-10-CM | POA: Diagnosis not present

## 2017-03-13 DIAGNOSIS — Z01810 Encounter for preprocedural cardiovascular examination: Secondary | ICD-10-CM | POA: Diagnosis not present

## 2017-03-13 DIAGNOSIS — E876 Hypokalemia: Secondary | ICD-10-CM | POA: Diagnosis not present

## 2017-03-13 MED ORDER — GLUCOSE BLOOD VI STRP: ORAL_STRIP | 12 refills | Status: DC

## 2017-03-14 ENCOUNTER — Other Ambulatory Visit: Payer: Self-pay | Admitting: Family Medicine

## 2017-03-14 DIAGNOSIS — R351 Nocturia: Secondary | ICD-10-CM | POA: Diagnosis not present

## 2017-03-14 DIAGNOSIS — N401 Enlarged prostate with lower urinary tract symptoms: Secondary | ICD-10-CM | POA: Diagnosis not present

## 2017-03-14 DIAGNOSIS — G4733 Obstructive sleep apnea (adult) (pediatric): Secondary | ICD-10-CM | POA: Diagnosis not present

## 2017-03-14 LAB — BRAIN NATRIURETIC PEPTIDE: BNP: 23.2 pg/mL (ref 0.0–100.0)

## 2017-03-14 NOTE — Telephone Encounter (Signed)
Rx not on pts current med list?

## 2017-03-14 NOTE — Telephone Encounter (Signed)
Would NOT renew at this time.  His creatinine has shot up recently and would not be safe to be on at this time.

## 2017-03-14 NOTE — Telephone Encounter (Signed)
I called the pt and informed his wife of the message below.  She stated the pt is aware not to take this medication and it must have been sent electronically by the pharmacy.  Rx denial sent.

## 2017-03-15 ENCOUNTER — Ambulatory Visit (HOSPITAL_COMMUNITY): Payer: Federal, State, Local not specified - PPO

## 2017-03-18 DIAGNOSIS — G4733 Obstructive sleep apnea (adult) (pediatric): Secondary | ICD-10-CM | POA: Diagnosis not present

## 2017-03-22 ENCOUNTER — Other Ambulatory Visit (INDEPENDENT_AMBULATORY_CARE_PROVIDER_SITE_OTHER): Payer: Federal, State, Local not specified - PPO

## 2017-03-22 DIAGNOSIS — N179 Acute kidney failure, unspecified: Secondary | ICD-10-CM

## 2017-03-22 LAB — BASIC METABOLIC PANEL
BUN: 18 mg/dL (ref 6–23)
CALCIUM: 8.8 mg/dL (ref 8.4–10.5)
CHLORIDE: 100 meq/L (ref 96–112)
CO2: 31 meq/L (ref 19–32)
CREATININE: 1.65 mg/dL — AB (ref 0.40–1.50)
GFR: 43.82 mL/min — AB (ref >60.00)
Glucose, Bld: 124 mg/dL — ABNORMAL HIGH (ref 70–99)
Potassium: 4.2 mEq/L (ref 3.5–5.1)
Sodium: 139 mEq/L (ref 135–145)

## 2017-03-22 NOTE — Telephone Encounter (Signed)
Lab was drawn 03/13/17 after a hospital follow up visit with his PCP. The results were addressed.

## 2017-03-26 ENCOUNTER — Telehealth: Payer: Self-pay | Admitting: Physician Assistant

## 2017-03-26 ENCOUNTER — Other Ambulatory Visit: Payer: Self-pay | Admitting: Family Medicine

## 2017-03-26 DIAGNOSIS — N289 Disorder of kidney and ureter, unspecified: Secondary | ICD-10-CM

## 2017-03-26 NOTE — Telephone Encounter (Addendum)
error:[id number] ° °

## 2017-03-26 NOTE — Telephone Encounter (Signed)
Continue to hold metformin.  Hydrate well,  Repeat BMP in 2 weeks. I would hold on enterography until follow up BMP

## 2017-03-26 NOTE — Telephone Encounter (Signed)
Left message on machine for patient to return our call.  Patient should hold on enterography until follow up labs.

## 2017-03-26 NOTE — Telephone Encounter (Signed)
This is a duplicate note, see previous,

## 2017-03-26 NOTE — Telephone Encounter (Signed)
We need the PCP to decide if he is able to have the CT enterography yet from a renal function standpoint. The message appears to have been sent to that office.

## 2017-03-27 ENCOUNTER — Other Ambulatory Visit: Payer: Self-pay

## 2017-03-27 MED ORDER — AMIODARONE HCL 200 MG PO TABS: 200.0000 mg | ORAL_TABLET | Freq: Every day | ORAL | 3 refills | Status: DC

## 2017-03-27 NOTE — Telephone Encounter (Signed)
Patient is aware 

## 2017-03-27 NOTE — Telephone Encounter (Signed)
Rx(s) sent to pharmacy electronically.  

## 2017-04-05 ENCOUNTER — Other Ambulatory Visit (INDEPENDENT_AMBULATORY_CARE_PROVIDER_SITE_OTHER): Payer: Federal, State, Local not specified - PPO

## 2017-04-05 DIAGNOSIS — N289 Disorder of kidney and ureter, unspecified: Secondary | ICD-10-CM | POA: Diagnosis not present

## 2017-04-05 LAB — BASIC METABOLIC PANEL
BUN: 21 mg/dL (ref 6–23)
CO2: 31 mEq/L (ref 19–32)
CREATININE: 1.6 mg/dL — AB (ref 0.40–1.50)
Calcium: 9.1 mg/dL (ref 8.4–10.5)
Chloride: 102 mEq/L (ref 96–112)
GFR: 45.4 mL/min — ABNORMAL LOW (ref >60.00)
Glucose, Bld: 127 mg/dL — ABNORMAL HIGH (ref 70–99)
Potassium: 4.6 mEq/L (ref 3.5–5.1)
Sodium: 141 mEq/L (ref 135–145)

## 2017-04-10 ENCOUNTER — Telehealth: Payer: Self-pay | Admitting: Family Medicine

## 2017-04-10 NOTE — Telephone Encounter (Signed)
Left message on machine for patient to return our call 

## 2017-04-10 NOTE — Telephone Encounter (Signed)
Copied from [place] [phone]. Topic: Quick Communication - See Telephone Encounter >> Apr 10, 2017 11:05 AM Ahmed Prima L wrote: CRM for notification. See Telephone encounter for:   04/10/17.  Needs a nurse to call back about the lab results that he received over the weekend. His wife is wanting to know if he needs to come back in for another draw bc she did not think they were good enough. Please call her at [phone]

## 2017-04-10 NOTE — Telephone Encounter (Signed)
Results and recommendation given to pt's wife, [name] per notes of Dr. Elease Hashimoto on 12/29. Lab appt scheduled on 05/13/17. Unable to document in result note due to note not being routed to [facility].

## 2017-04-16 ENCOUNTER — Other Ambulatory Visit: Payer: Self-pay

## 2017-04-16 ENCOUNTER — Encounter: Payer: Self-pay | Admitting: Family Medicine

## 2017-04-16 ENCOUNTER — Ambulatory Visit (HOSPITAL_COMMUNITY): Payer: Federal, State, Local not specified - PPO | Attending: Cardiology

## 2017-04-16 DIAGNOSIS — E119 Type 2 diabetes mellitus without complications: Secondary | ICD-10-CM | POA: Diagnosis not present

## 2017-04-16 DIAGNOSIS — I481 Persistent atrial fibrillation: Secondary | ICD-10-CM

## 2017-04-16 DIAGNOSIS — E785 Hyperlipidemia, unspecified: Secondary | ICD-10-CM | POA: Diagnosis not present

## 2017-04-16 DIAGNOSIS — I5021 Acute systolic (congestive) heart failure: Secondary | ICD-10-CM | POA: Insufficient documentation

## 2017-04-16 DIAGNOSIS — I110 Hypertensive heart disease with heart failure: Secondary | ICD-10-CM | POA: Diagnosis not present

## 2017-04-16 DIAGNOSIS — J449 Chronic obstructive pulmonary disease, unspecified: Secondary | ICD-10-CM | POA: Insufficient documentation

## 2017-04-16 DIAGNOSIS — I4819 Other persistent atrial fibrillation: Secondary | ICD-10-CM

## 2017-04-16 DIAGNOSIS — Z87891 Personal history of nicotine dependence: Secondary | ICD-10-CM | POA: Insufficient documentation

## 2017-04-17 ENCOUNTER — Telehealth: Payer: Self-pay | Admitting: Physician Assistant

## 2017-04-17 NOTE — Telephone Encounter (Signed)
Spoke with Ronald [name]. Patient is scheduled for a CT enterography on 04/24/17. He is instructed to arrive at 10:00 am to [facility]. He will begin fasting at 6:00 am.

## 2017-04-18 ENCOUNTER — Encounter: Payer: Self-pay | Admitting: Cardiology

## 2017-04-18 ENCOUNTER — Ambulatory Visit (INDEPENDENT_AMBULATORY_CARE_PROVIDER_SITE_OTHER): Payer: Federal, State, Local not specified - PPO | Admitting: Cardiology

## 2017-04-18 VITALS — BP 122/62 | HR 68 | Ht 71.0 in | Wt 227.4 lb

## 2017-04-18 DIAGNOSIS — I5043 Acute on chronic combined systolic (congestive) and diastolic (congestive) heart failure: Secondary | ICD-10-CM | POA: Diagnosis not present

## 2017-04-18 DIAGNOSIS — I43 Cardiomyopathy in diseases classified elsewhere: Secondary | ICD-10-CM

## 2017-04-18 DIAGNOSIS — I4819 Other persistent atrial fibrillation: Secondary | ICD-10-CM

## 2017-04-18 DIAGNOSIS — Z7901 Long term (current) use of anticoagulants: Secondary | ICD-10-CM

## 2017-04-18 DIAGNOSIS — I429 Cardiomyopathy, unspecified: Secondary | ICD-10-CM

## 2017-04-18 DIAGNOSIS — Z01810 Encounter for preprocedural cardiovascular examination: Secondary | ICD-10-CM

## 2017-04-18 DIAGNOSIS — I10 Essential (primary) hypertension: Secondary | ICD-10-CM | POA: Diagnosis not present

## 2017-04-18 DIAGNOSIS — I481 Persistent atrial fibrillation: Secondary | ICD-10-CM | POA: Diagnosis not present

## 2017-04-18 NOTE — Progress Notes (Signed)
PCP: [name], MD  Clinic Note: Chief Complaint  Patient presents with  . Follow-up    HPI: Ronald [name] is a 72 y.o. male with a PMH below who presents today for 2nd hospital follow-up.  He has a history of persistent atrial fibrillation on amiodarone with recent "stress-induced CM" during a hospitalization with sepsis.  CHANCELOR HARDRICK was last seen on Nov 29,2018 -- as hospital follow-up. November 12-19th 2018: Admitted for sepsis.  Also noted to have acute combined heart failure with reduced ejection fraction on echo. -- was completely swollen. By the time I saw him in follow-up, he is feeling much better. No further heart failure symptoms of PND, orthopnea with improved edema. We recheck 2-D echocardiogram to reassess EF while no longer sick.  Recent Hospitalizations:   None since last visit.  Studies Personally Reviewed - (if available, images/films reviewed: From Epic Chart or Care Everywhere)  2D Echo February 22, 2017: Mild to moderately reduced EF 40-45%.  Diffuse hypokinesis.  No regional wall motion normality.  Moderate biatrial enlargement.  Recheck Echo Apr 16 2017: Normal LVF 55-60%, mildly dilated aortic root(38 mm) and ascending aorta (44 mm). Compared to prior echo, LVEF has improved.  Interval History: [name] returns today starting to finally feel better now.  Overall, his heart failure symptoms have stabilized with fairly well controlled edema. He only takes extra Lasix 1-2 x /wk .  Only 1 day of swelling - missed dose of lasix this past weekend..  Gained wgt over holidays - eating more - not associated with PND orthopnea. He seems to have recovered from his illness with no further fevers or chills. He has not had any recurrence of any A. fib symptoms with no rapid irregular heartbeats or palpitations.  Chest tightness pressure with rest or exertion. No sick be/near syncope or TIA/amaurosis fugax. No claudication.  ROS: A comprehensive was  performed. Review of Systems  Constitutional: Negative for malaise/fatigue and weight loss (- Gained weight over holidays).  HENT: Positive for congestion (and sunny nose).   Eyes: Negative for blurred vision.  Respiratory: Positive for cough (Allergy related). Negative for shortness of breath and wheezing.   Cardiovascular: Positive for leg swelling (Controlled). Negative for palpitations.  Gastrointestinal: Negative for blood in stool, constipation, heartburn and melena.  Genitourinary: Negative for hematuria.  Musculoskeletal: Positive for joint pain.  Neurological: Negative for dizziness.  Endo/Heme/Allergies: Positive for environmental allergies. Bruises/bleeds easily.  Psychiatric/Behavioral: Negative.   All other systems reviewed and are negative.  I have reviewed and (if needed) personally updated the patient's problem list, medications, allergies, past medical and surgical history, social and family history.   Past Medical History:  Diagnosis Date  . Allergic rhinitis   . Anticoagulant long-term use    eliquis  . Bilateral hydrocele   . COPD with emphysema [facility])    pulmologist-  dr Halford Chessman  . Dyspnea    occasional per pt  . History of colon polyps    tubular adenoma 2013  . History of gout    last episode early June 2017-- (feet)  . Hyperlipidemia   . Hypertension   . Nocturia    flomax has improved symptoms  . OSA on CPAP    per study 08-03-2004  Severe OSA  . Persistent atrial fibrillation [facility])    cardiologist --  paula ross--  post cardioversion 05-07-2014  . Prostate cancer [facility]) urologsit-  dr Alyson Ingles   Dx  2014--  stage T1c, Gleason 3+3=6, PSA  6.67--  Active surveillance  . 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 Seasonal allergies   . Sigmoid diverticulosis   . Tinea versicolor   . Type 2 diabetes mellitus ([place])   . Urgency of urination   . Wears glasses     Past Surgical History:  Procedure  Laterality Date  . CARDIOVERSION N/A 05/07/2014   Procedure: CARDIOVERSION;  Surgeon: [name], MD;  Location: [facility] ENDOSCOPY;  Service: Cardiovascular;  Laterality: N/A;  . CARDIOVERSION N/A 09/09/2014   Procedure: CARDIOVERSION;  Surgeon: Lelon Perla, MD;  Location: [facility] ENDOSCOPY;  Service: Cardiovascular;  Laterality: N/A;  successfully  . COLONOSCOPY  last one 06-07-2011  . HYDROCELE EXCISION Bilateral 09/26/2015   Procedure: HYDROCELECTOMY ADULT;  Surgeon: [name], MD;  Location: [facility];  Service: Urology;  Laterality: Bilateral;  . LAMINECTOMY AND MICRODISCECTOMY LUMBAR SPINE  12-23-2003   Left L5 -- S1  . LAPAROSCOPIC BILATERAL INGUINAL HERNIA REPAIR/  UMBILICAL HERNIA REPAIR WITH MESH/  ASPIRATION LEFT HYDROCELE  07-11-2012  . NM MYOVIEW LTD  05/18/2014   Low risk study. Normal perfusion: No ischemia or infarction. Mild LV dysfunction - 46% (does not correlate with echocardiographic EF of 50-55%)  . PROSTATE BIOPSY  05/15/12   Clinically both [place]  . TEE WITHOUT CARDIOVERSION N/A 05/07/2014   Procedure: TRANSESOPHAGEAL ECHOCARDIOGRAM (TEE);  Surgeon: [name], MD;  Location: [facility] ENDOSCOPY;  Service: Cardiovascular;  Laterality: N/A;   mild atherosclerosis plaque of aorta,  mild AR, MR, and TR,  no cardiac source of emboli  . TONSILLECTOMY  as child  . TRANSTHORACIC ECHOCARDIOGRAM  11/'18; 1/'19   a) In setting of sepsis: EF of 40-45%.  Diffuse hypokinesis.  No RWMA.  Biatrial enlargement.;; b) f/u Jan 2019: Normal LVF 55-60%, mildly dilated aortic root(38 mm) and ascending aorta (44 mm). Compared to prior echo, LVEF has improved.    Current Meds  Medication Sig  . acetaminophen (TYLENOL) 500 MG tablet Take 500 mg by mouth every 6 (six) hours as needed for mild pain or moderate pain.  Marland Kitchen ALPRAZolam (XANAX) 0.25 MG tablet Take 1 tablet (0.25 mg total) at bedtime as needed by mouth for anxiety or sleep.  Marland Kitchen amiodarone (PACERONE) 200 MG tablet Take 1  tablet (200 mg total) by mouth daily.  Marland Kitchen apixaban (ELIQUIS) 5 MG TABS tablet Take 5 mg by mouth 2 (two) times daily.  Marland Kitchen atorvastatin (LIPITOR) 10 MG tablet TAKE 1 TABLET (10 MG TOTAL) BY MOUTH EVERY EVENING.  Marland Kitchen azelastine (ASTELIN) 0.1 % nasal spray Place 1 spray into both nostrils 2 (two) times daily. Use in each nostril as directed  . carvedilol (COREG) 6.25 MG tablet Take 1 tablet (6.25 mg total) 2 (two) times daily with a meal by mouth.  . colchicine 0.6 MG tablet Two tabs at onset of gout flare and then one tab twice daily as needed  . furosemide (LASIX) 40 MG tablet Take 1 tablet (40 mg total) 2 (two) times daily by mouth. Take 1 tablet in the morning and second tablet at 2pm daily.  Marland Kitchen glipiZIDE (GLUCOTROL) 5 MG tablet Take 0.5 tablets (2.5 mg total) 2 (two) times daily by mouth.  Marland Kitchen glucose blood (ACCU-CHEK AVIVA PLUS) test strip Test once daily dx e11.9  . hydrocortisone (ANUSOL-HC) 2.5 % rectal cream Apply 2 (two) times daily topically.  . Lancets (ACCU-CHEK SOFT TOUCH) lancets Check blood sugars once per day. DX: E11.9  .  losartan (COZAAR) 25 MG tablet Take 1 tablet (25 mg total) by mouth daily.  . montelukast (SINGULAIR) 10 MG tablet Take 1 tablet (10 mg total) by mouth at bedtime.  . potassium chloride SA (K-DUR,KLOR-CON) 20 MEQ tablet Take 2 tablets (40 mEq total) daily by mouth.  . tamsulosin (FLOMAX) 0.4 MG CAPS capsule Take 0.4 mg by mouth daily after supper.     No Known Allergies  Social History   Socioeconomic History  . Marital status: Married    Spouse name: None  . Number of children: 2  Occupational History  . Occupation: Scientist, clinical (histocompatibility and immunogenetics): [employer]  Tobacco Use  . Smoking status: Former Smoker    Packs/day: 1.00    Years: 55.00    Pack years: 55.00    Types: Cigarettes    Last attempt to quit: 02/18/2017    Years since quitting: 0.1  . Smokeless tobacco: Never Used  . Tobacco comment: trying to quit  mid Jan 2017 - as of 09-20-2015  down to , per pt  2-3 cigarettes per day  Substance and Sexual Activity  . Alcohol use: Yes    Comment: daily  . Drug use: No  . Sexual activity: None  Other Topics Concern  . None  Social History Narrative   Married 25 years   2 children but not with this wife    family history includes Breast cancer in his mother; Ovarian cancer in his sister; Stroke in his unknown relative.  Wt Readings from Last 3 Encounters:  04/18/17 227 lb 6.4 oz (103.1 kg)  03/08/17 226 lb 9.6 oz (102.8 kg)  03/08/17 227 lb (103 kg)    PHYSICAL EXAM BP 122/62   Pulse 68   Ht 5\' 11"  (1.803 m)   Wt 227 lb 6.4 oz (103.1 kg)   BMI 31.72 kg/m  Physical Exam  Constitutional: He is oriented to person, place, and time. He appears well-developed and well-nourished. No distress.  Well groomed.   HENT:  Head: Normocephalic and atraumatic.  Mouth/Throat: Oropharynx is clear and moist.  Neck: Normal range of motion. Normal carotid pulses, no hepatojugular reflux and no JVD present. Carotid bruit is not present.  Cardiovascular: Normal rate, regular rhythm, normal heart sounds and intact distal pulses. Exam reveals no gallop and no friction rub.  No murmur heard. Pulmonary/Chest: Effort normal and breath sounds normal. No respiratory distress. He has no wheezes. He has no rales.  Abdominal: Soft. Bowel sounds are normal. He exhibits no distension. There is no tenderness. There is no rebound.  Musculoskeletal: Normal range of motion. He exhibits no edema (trace bilaeral ankle swelling).  Neurological: He is alert and oriented to person, place, and time. No cranial nerve deficit.  Skin: Skin is warm and dry. No rash noted. No erythema.  tan  Psychiatric: He has a normal mood and affect. His behavior is normal. Judgment and thought content normal.  Nursing note and vitals reviewed.    Adult ECG Report Not checked  Other studies Reviewed: Additional studies/ records that were reviewed today include:  Recent Labs: He is due  for PCP to check his lipids soon.  Lab Results  Component Value Date   CHOL 144 06/07/2016   HDL 46 06/07/2016   LDLCALC 62 06/07/2016   LDLDIRECT 57.8 03/10/2012   TRIG 179 (H) 06/07/2016   CHOLHDL 3.1 06/07/2016   Lab Results  Component Value Date   TSH 1.650 02/19/2017   Lab Results  Component Value Date  ALT 43 03/08/2017   AST 28 03/08/2017   ALKPHOS 84 03/08/2017   BILITOT 0.9 03/08/2017    ASSESSMENT / PLAN: Problem List Items Addressed This Visit    Acute on chronic combined systolic and diastolic CHF (congestive heart failure) (HCC) (Chronic)    Seems to resolve as his EF is improved. Sign continue current dose of diuretic along with ARB and beta blocker      Atrial fibrillation, persistent ([place]):  CHA2DS2-VASc Score 3 - On Eliquis (Chronic)    Remaining in sinus rhythm on amiodarone. On ELIQUIS for anti-coagulation with no bleeding issues.  Will recheck PFTs in the fall. He is having his TFTs and LFTs checked annually. Remains on beta blocker for additional rate control      Cardiomyopathy due to systemic disease ([place]) - Primary    Again, mostly stress related, his EF is pretty much back to baseline now that he is healthy again. Continue carvedilol and losartan at current doses. Continue standing dose of Lasix. Additional dosing.  With his EF now back to normal, he should be fine for upcoming surgery.       Chronic anticoagulation (Chronic)    No bleeding issues with ELIQUIS. Would be okay to hold ELIQUIS 48 hours preop - is asking about urologic surgery.  With no active CHF or Angina, NO CVA, normal renal fxn & non-insulin dependent DM - Low Risk for LOW RISK surgery.      Essential hypertension (Chronic)    Stable on ARB and carvedilol.      Preop cardiovascular exam    With no active CHF or Angina, NO CVA, normal renal fxn & non-insulin dependent DM - Low Risk for LOW RISK surgery. OK to hold Eliquis 2d Pre-op.  OK to restart 1-2 d post-op (per  surgeon)         Current medicines are reviewed at length with the patient today. (+/- concerns) none The following changes have been made:See below  Patient Instructions  NO MEDICATION CHANGES   ECHO IS BETTER   YOU HAVE CLEARANCE FOR BOTH PROCEDURES    Your physician wants you to follow-up in [place] DR HARDING. You will receive a reminder letter in the mail two months in advance. If you don't receive a letter, please call our office to schedule the follow-up appointment.    Studies Ordered:   No orders of the defined types were placed in this encounter.     Glenetta Hew, M.D., M.S. Interventional Cardiologist   Pager # [phone] Phone # [phone] [address] [place], [place] [address]

## 2017-04-18 NOTE — Patient Instructions (Signed)
NO MEDICATION CHANGES   ECHO IS BETTER   YOU HAVE CLEARANCE FOR BOTH PROCEDURES    Your physician wants you to follow-up in [place]. You will receive a reminder letter in the mail two months in advance. If you don't receive a letter, please call our office to schedule the follow-up appointment.

## 2017-04-21 ENCOUNTER — Encounter: Payer: Self-pay | Admitting: Cardiology

## 2017-04-21 NOTE — Assessment & Plan Note (Signed)
With no active CHF or Angina, NO CVA, normal renal fxn & non-insulin dependent DM - Low Risk for LOW RISK surgery. OK to hold Eliquis 2d Pre-op.  OK to restart 1-2 d post-op (per surgeon)

## 2017-04-21 NOTE — Assessment & Plan Note (Signed)
Again, mostly stress related, his EF is pretty much back to baseline now that he is healthy again. Continue carvedilol and losartan at current doses. Continue standing dose of Lasix. Additional dosing.  With his EF now back to normal, he should be fine for upcoming surgery.

## 2017-04-21 NOTE — Assessment & Plan Note (Signed)
No bleeding issues with ELIQUIS. Would be okay to hold ELIQUIS 48 hours preop - is asking about urologic surgery.  With no active CHF or Angina, NO CVA, normal renal fxn & non-insulin dependent DM - Low Risk for LOW RISK surgery.

## 2017-04-21 NOTE — Assessment & Plan Note (Signed)
Remaining in sinus rhythm on amiodarone. On ELIQUIS for anti-coagulation with no bleeding issues.  Will recheck PFTs in the fall. He is having his TFTs and LFTs checked annually. Remains on beta blocker for additional rate control

## 2017-04-21 NOTE — Assessment & Plan Note (Signed)
Seems to resolve as his EF is improved. Sign continue current dose of diuretic along with ARB and beta blocker

## 2017-04-21 NOTE — Assessment & Plan Note (Addendum)
Stable on ARB and carvedilol.

## 2017-04-24 ENCOUNTER — Ambulatory Visit (HOSPITAL_COMMUNITY)
Admission: RE | Admit: 2017-04-24 | Discharge: 2017-04-24 | Disposition: A | Discharge status: Home / Self Care | Payer: Federal, State, Local not specified - PPO | Source: Ambulatory Visit | Attending: Physician Assistant | Admitting: Physician Assistant

## 2017-04-24 ENCOUNTER — Encounter (HOSPITAL_COMMUNITY): Payer: Self-pay

## 2017-04-24 DIAGNOSIS — N289 Disorder of kidney and ureter, unspecified: Secondary | ICD-10-CM

## 2017-04-24 DIAGNOSIS — I700 Atherosclerosis of aorta: Secondary | ICD-10-CM | POA: Diagnosis not present

## 2017-04-24 DIAGNOSIS — I2510 Atherosclerotic heart disease of native coronary artery without angina pectoris: Secondary | ICD-10-CM | POA: Insufficient documentation

## 2017-04-24 DIAGNOSIS — C61 Malignant neoplasm of prostate: Secondary | ICD-10-CM | POA: Diagnosis not present

## 2017-04-24 DIAGNOSIS — Z8679 Personal history of other diseases of the circulatory system: Secondary | ICD-10-CM | POA: Diagnosis not present

## 2017-04-24 DIAGNOSIS — D3502 Benign neoplasm of left adrenal gland: Secondary | ICD-10-CM | POA: Diagnosis not present

## 2017-04-24 DIAGNOSIS — R933 Abnormal findings on diagnostic imaging of other parts of digestive tract: Secondary | ICD-10-CM

## 2017-04-24 MED ORDER — BARIUM SULFATE 0.1 % PO SUSP
ORAL | Status: AC
  Filled 2017-04-24: qty 3

## 2017-04-24 MED ORDER — IOPAMIDOL (ISOVUE-300) INJECTION 61%
100.0000 mL | Freq: Once | INTRAVENOUS | Status: AC | PRN
  Administered 2017-04-24: 80 mL via INTRAVENOUS

## 2017-04-24 MED ORDER — IOPAMIDOL (ISOVUE-300) INJECTION 61%
INTRAVENOUS | Status: AC
  Filled 2017-04-24: qty 100

## 2017-04-25 ENCOUNTER — Other Ambulatory Visit: Payer: Self-pay | Admitting: *Deleted

## 2017-04-25 ENCOUNTER — Other Ambulatory Visit: Payer: Self-pay | Admitting: Family Medicine

## 2017-04-25 DIAGNOSIS — N401 Enlarged prostate with lower urinary tract symptoms: Secondary | ICD-10-CM | POA: Diagnosis not present

## 2017-04-25 DIAGNOSIS — R972 Elevated prostate specific antigen [PSA]: Secondary | ICD-10-CM | POA: Diagnosis not present

## 2017-04-25 DIAGNOSIS — R351 Nocturia: Secondary | ICD-10-CM | POA: Diagnosis not present

## 2017-04-25 DIAGNOSIS — C61 Malignant neoplasm of prostate: Secondary | ICD-10-CM | POA: Diagnosis not present

## 2017-04-25 NOTE — Telephone Encounter (Signed)
Glipizide filled as requested (per last results notes).   Dr. Elease Hashimoto, please advise carvedilol and potassium. These were last filled by hospitalist, and patient also sees cards. Do you want to fill or should these go to cardiology?

## 2017-04-25 NOTE — Progress Notes (Signed)
Request routed to [place]

## 2017-04-25 NOTE — Telephone Encounter (Signed)
Copied from [place] [id number]. Topic: Quick Communication - See Telephone Encounter >> Apr 25, 2017 12:57 PM Percell Belt A wrote: CRM for notification. See Telephone encounter for: pt is totally out of the attached 3 meds.  He would like to know if he can get them refilled today, he is completely out     04/25/17.

## 2017-04-25 NOTE — Telephone Encounter (Signed)
request for KlorCon; pt states that he is out and would like to have this filled today

## 2017-04-26 NOTE — Telephone Encounter (Signed)
Go ahead and refill both for 6 months.

## 2017-04-30 ENCOUNTER — Other Ambulatory Visit: Payer: Self-pay | Admitting: Urology

## 2017-05-13 ENCOUNTER — Other Ambulatory Visit (INDEPENDENT_AMBULATORY_CARE_PROVIDER_SITE_OTHER): Payer: Federal, State, Local not specified - PPO

## 2017-05-13 DIAGNOSIS — I5021 Acute systolic (congestive) heart failure: Secondary | ICD-10-CM

## 2017-05-13 LAB — BRAIN NATRIURETIC PEPTIDE: Pro B Natriuretic peptide (BNP): 16 pg/mL (ref 0.0–100.0)

## 2017-05-17 ENCOUNTER — Telehealth: Payer: Self-pay | Admitting: Family Medicine

## 2017-05-17 ENCOUNTER — Encounter (HOSPITAL_COMMUNITY): Payer: Self-pay

## 2017-05-17 NOTE — Patient Instructions (Addendum)
Ronald [name]  05/17/2017   Your procedure is scheduled on: 05/30/2017   Report to [facility] Main  Entrance  Report to admitting at    9:45 AM   Call this number if you have problems the morning of surgery [phone]   Remember: Do not eat food or drink liquids :After Midnight.  Bring CPAP mask and tubing   Take these medicines the morning of surgery with A SIP OF WATER:Pacerone, Carvedilol, Flonase if needed, eye drops as usual   DO NOT TAKE ANY DIABETIC MEDICATIONS DAY OF YOUR SURGERY                               You may not have any metal on your body including hair pins and              piercings  Do not wear jewelry, , lotions, powders or perfumes, deodorant                      Men may shave face and neck.   Do not bring valuables to the hospital. [place].  Contacts, dentures or bridgework may not be worn into surgery.  Leave suitcase in the car. After surgery it may be brought to your room.                 Please read over the following fact sheets you were given: _____________________________________________________________________             [facility] - Preparing for Surgery Before surgery, you can play an important role.  Because skin is not sterile, your skin needs to be as free of germs as possible.  You can reduce the number of germs on your skin by washing with CHG (chlorahexidine gluconate) soap before surgery.  CHG is an antiseptic cleaner which kills germs and bonds with the skin to continue killing germs even after washing. Please DO NOT use if you have an allergy to CHG or antibacterial soaps.  If your skin becomes reddened/irritated stop using the CHG and inform your nurse when you arrive at Short Stay. Do not shave (including legs and underarms) for at least 48 hours prior to the first CHG shower.  You may shave your face/neck. Please follow these instructions carefully:  1.   Shower with CHG Soap the night before surgery and the  morning of Surgery.  2.  If you choose to wash your hair, wash your hair first as usual with your  normal  shampoo.  3.  After you shampoo, rinse your hair and body thoroughly to remove the  shampoo.                           4.  Use CHG as you would any other liquid soap.  You can apply chg directly  to the skin and wash                       Gently with a scrungie or clean washcloth.  5.  Apply the CHG Soap to your body ONLY FROM THE NECK DOWN.   Do not use on face/ open  Wound or open sores. Avoid contact with eyes, ears mouth and genitals (private parts).                       Wash face,  Genitals (private parts) with your normal soap.             6.  Wash thoroughly, paying special attention to the area where your surgery  will be performed.  7.  Thoroughly rinse your body with warm water from the neck down.  8.  DO NOT shower/wash with your normal soap after using and rinsing off  the CHG Soap.                9.  Pat yourself dry with a clean towel.            10.  Wear clean pajamas.            11.  Place clean sheets on your bed the night of your first shower and do not  sleep with pets. Day of Surgery : Do not apply any lotions/deodorants the morning of surgery.  Please wear clean clothes to the hospital/surgery center.  FAILURE TO FOLLOW THESE INSTRUCTIONS MAY RESULT IN THE CANCELLATION OF YOUR SURGERY PATIENT SIGNATURE_________________________________  NURSE SIGNATURE__________________________________  ________________________________________________________________________

## 2017-05-17 NOTE — Telephone Encounter (Signed)
Copied from [place]. Topic: General - Other >> May 17, 2017  2:05 PM Darl Householder, RMA wrote: Reason for CRM: Patient is requesting a call back from Dr. Elease Hashimoto or [place] concerning labs drawn on 05/13/17

## 2017-05-17 NOTE — Telephone Encounter (Signed)
GI ordered BNP= normal.

## 2017-05-17 NOTE — Telephone Encounter (Signed)
Copied from [place]. Topic: Quick Communication - See Telephone Encounter >> May 17, 2017  9:54 AM Aurelio Brash B wrote: CRM for notification. See Telephone encounter for:  PT would like results from lab work  05/17/17.

## 2017-05-17 NOTE — Telephone Encounter (Signed)
Left message on machine for patient to return our call.  CRM created 

## 2017-05-20 ENCOUNTER — Other Ambulatory Visit: Payer: Self-pay | Admitting: Family Medicine

## 2017-05-20 ENCOUNTER — Encounter (HOSPITAL_COMMUNITY): Payer: Self-pay

## 2017-05-20 DIAGNOSIS — E1140 Type 2 diabetes mellitus with diabetic neuropathy, unspecified: Secondary | ICD-10-CM

## 2017-05-20 NOTE — Telephone Encounter (Signed)
Pt is returning [place] phone call about his labs. Please f/u with pt at [phone]

## 2017-05-20 NOTE — Telephone Encounter (Signed)
Spoke with patient and new lab appointment made

## 2017-05-21 ENCOUNTER — Encounter (HOSPITAL_COMMUNITY): Payer: Self-pay

## 2017-05-21 ENCOUNTER — Other Ambulatory Visit: Payer: Self-pay

## 2017-05-21 ENCOUNTER — Encounter (HOSPITAL_COMMUNITY)
Admission: RE | Admit: 2017-05-21 | Discharge: 2017-05-21 | Disposition: A | Discharge status: Home / Self Care | Payer: Federal, State, Local not specified - PPO | Source: Ambulatory Visit | Attending: Urology | Admitting: Urology

## 2017-05-21 DIAGNOSIS — E119 Type 2 diabetes mellitus without complications: Secondary | ICD-10-CM | POA: Insufficient documentation

## 2017-05-21 DIAGNOSIS — N400 Enlarged prostate without lower urinary tract symptoms: Secondary | ICD-10-CM | POA: Insufficient documentation

## 2017-05-21 DIAGNOSIS — Z01818 Encounter for other preprocedural examination: Secondary | ICD-10-CM | POA: Insufficient documentation

## 2017-05-21 DIAGNOSIS — N401 Enlarged prostate with lower urinary tract symptoms: Secondary | ICD-10-CM | POA: Diagnosis not present

## 2017-05-21 DIAGNOSIS — C61 Malignant neoplasm of prostate: Secondary | ICD-10-CM | POA: Diagnosis not present

## 2017-05-21 HISTORY — DX: Personal history of other infectious and parasitic diseases: Z86.19

## 2017-05-21 HISTORY — DX: Presence of external hearing-aid: Z97.4

## 2017-05-21 HISTORY — DX: Chronic combined systolic (congestive) and diastolic (congestive) heart failure: I50.42

## 2017-05-21 LAB — BASIC METABOLIC PANEL
Anion gap: 13 (ref 5–15)
BUN: 21 mg/dL — AB (ref 6–20)
CHLORIDE: 99 mmol/L — AB (ref 101–111)
CO2: 27 mmol/L (ref 22–32)
Calcium: 9.2 mg/dL (ref 8.9–10.3)
Creatinine, Ser: 1.61 mg/dL — ABNORMAL HIGH (ref 0.61–1.24)
GFR calc Af Amer: 48 mL/min — ABNORMAL LOW (ref >60)
GFR calc non Af Amer: 41 mL/min — ABNORMAL LOW (ref >60)
GLUCOSE: 136 mg/dL — AB (ref 65–99)
POTASSIUM: 4.5 mmol/L (ref 3.5–5.1)
Sodium: 139 mmol/L (ref 135–145)

## 2017-05-21 LAB — HEMOGLOBIN A1C
Hgb A1c MFr Bld: 6.2 % — ABNORMAL HIGH (ref 4.8–5.6)
Mean Plasma Glucose: 131.24 mg/dL

## 2017-05-21 LAB — CBC
HCT: 45.8 % (ref 39.0–52.0)
Hemoglobin: 15.5 g/dL (ref 13.0–17.0)
MCH: 33.3 pg (ref 26.0–34.0)
MCHC: 33.8 g/dL (ref 30.0–36.0)
MCV: 98.3 fL (ref 78.0–100.0)
Platelets: 200 10*3/uL (ref 150–400)
RBC: 4.66 MIL/uL (ref 4.22–5.81)
RDW: 13.3 % (ref 11.5–15.5)
WBC: 9.4 10*3/uL (ref 4.0–10.5)

## 2017-05-21 LAB — GLUCOSE, CAPILLARY: Glucose-Capillary: 148 mg/dL — ABNORMAL HIGH (ref 65–99)

## 2017-05-21 NOTE — Progress Notes (Signed)
EKG dated 02-22-2017 in epic.  Cardiac lov note w/ clearance , dr harding dated 04-18-2017 in epic.  CXR dated 02-24-2017 in epic.

## 2017-05-22 ENCOUNTER — Other Ambulatory Visit (INDEPENDENT_AMBULATORY_CARE_PROVIDER_SITE_OTHER): Payer: Federal, State, Local not specified - PPO

## 2017-05-22 DIAGNOSIS — E1140 Type 2 diabetes mellitus with diabetic neuropathy, unspecified: Secondary | ICD-10-CM

## 2017-05-22 LAB — BASIC METABOLIC PANEL
BUN: 23 mg/dL (ref 6–23)
CALCIUM: 9 mg/dL (ref 8.4–10.5)
CO2: 30 mEq/L (ref 19–32)
CREATININE: 1.72 mg/dL — AB (ref 0.40–1.50)
Chloride: 99 mEq/L (ref 96–112)
GFR: 41.75 mL/min — AB (ref >60.00)
GLUCOSE: 150 mg/dL — AB (ref 70–99)
Potassium: 4.4 mEq/L (ref 3.5–5.1)
SODIUM: 140 meq/L (ref 135–145)

## 2017-05-27 ENCOUNTER — Other Ambulatory Visit: Payer: Self-pay | Admitting: Family Medicine

## 2017-05-27 DIAGNOSIS — R944 Abnormal results of kidney function studies: Secondary | ICD-10-CM

## 2017-05-30 ENCOUNTER — Encounter (HOSPITAL_COMMUNITY): Payer: Self-pay

## 2017-05-30 ENCOUNTER — Observation Stay (HOSPITAL_COMMUNITY)
Admission: RE | Admit: 2017-05-30 | Discharge: 2017-05-31 | Disposition: A | Discharge status: Home / Self Care | Payer: Federal, State, Local not specified - PPO | Source: Ambulatory Visit | Attending: Urology | Admitting: Urology

## 2017-05-30 ENCOUNTER — Encounter (HOSPITAL_COMMUNITY)
Admission: RE | Disposition: A | Discharge status: Home / Self Care | Payer: Self-pay | Source: Ambulatory Visit | Attending: Urology

## 2017-05-30 ENCOUNTER — Ambulatory Visit (HOSPITAL_COMMUNITY): Payer: Federal, State, Local not specified - PPO | Admitting: Anesthesiology

## 2017-05-30 ENCOUNTER — Other Ambulatory Visit: Payer: Self-pay

## 2017-05-30 DIAGNOSIS — J309 Allergic rhinitis, unspecified: Secondary | ICD-10-CM | POA: Insufficient documentation

## 2017-05-30 DIAGNOSIS — H9193 Unspecified hearing loss, bilateral: Secondary | ICD-10-CM | POA: Insufficient documentation

## 2017-05-30 DIAGNOSIS — Z79899 Other long term (current) drug therapy: Secondary | ICD-10-CM | POA: Insufficient documentation

## 2017-05-30 DIAGNOSIS — I481 Persistent atrial fibrillation: Secondary | ICD-10-CM | POA: Insufficient documentation

## 2017-05-30 DIAGNOSIS — R3915 Urgency of urination: Secondary | ICD-10-CM | POA: Insufficient documentation

## 2017-05-30 DIAGNOSIS — Z803 Family history of malignant neoplasm of breast: Secondary | ICD-10-CM | POA: Insufficient documentation

## 2017-05-30 DIAGNOSIS — N401 Enlarged prostate with lower urinary tract symptoms: Secondary | ICD-10-CM | POA: Insufficient documentation

## 2017-05-30 DIAGNOSIS — Z7901 Long term (current) use of anticoagulants: Secondary | ICD-10-CM | POA: Diagnosis not present

## 2017-05-30 DIAGNOSIS — Z8041 Family history of malignant neoplasm of ovary: Secondary | ICD-10-CM | POA: Insufficient documentation

## 2017-05-30 DIAGNOSIS — R0600 Dyspnea, unspecified: Secondary | ICD-10-CM | POA: Insufficient documentation

## 2017-05-30 DIAGNOSIS — I110 Hypertensive heart disease with heart failure: Secondary | ICD-10-CM | POA: Insufficient documentation

## 2017-05-30 DIAGNOSIS — M109 Gout, unspecified: Secondary | ICD-10-CM | POA: Insufficient documentation

## 2017-05-30 DIAGNOSIS — R3914 Feeling of incomplete bladder emptying: Secondary | ICD-10-CM | POA: Insufficient documentation

## 2017-05-30 DIAGNOSIS — I10 Essential (primary) hypertension: Secondary | ICD-10-CM | POA: Diagnosis not present

## 2017-05-30 DIAGNOSIS — Z823 Family history of stroke: Secondary | ICD-10-CM | POA: Insufficient documentation

## 2017-05-30 DIAGNOSIS — B360 Pityriasis versicolor: Secondary | ICD-10-CM | POA: Diagnosis not present

## 2017-05-30 DIAGNOSIS — N400 Enlarged prostate without lower urinary tract symptoms: Secondary | ICD-10-CM | POA: Diagnosis present

## 2017-05-30 DIAGNOSIS — G4733 Obstructive sleep apnea (adult) (pediatric): Secondary | ICD-10-CM | POA: Insufficient documentation

## 2017-05-30 DIAGNOSIS — Z86010 Personal history of colonic polyps: Secondary | ICD-10-CM | POA: Insufficient documentation

## 2017-05-30 DIAGNOSIS — R350 Frequency of micturition: Secondary | ICD-10-CM | POA: Diagnosis not present

## 2017-05-30 DIAGNOSIS — J84115 Respiratory bronchiolitis interstitial lung disease: Secondary | ICD-10-CM | POA: Insufficient documentation

## 2017-05-30 DIAGNOSIS — I5042 Chronic combined systolic (congestive) and diastolic (congestive) heart failure: Secondary | ICD-10-CM | POA: Insufficient documentation

## 2017-05-30 DIAGNOSIS — J439 Emphysema, unspecified: Secondary | ICD-10-CM | POA: Diagnosis not present

## 2017-05-30 DIAGNOSIS — C61 Malignant neoplasm of prostate: Secondary | ICD-10-CM | POA: Diagnosis not present

## 2017-05-30 DIAGNOSIS — Z9842 Cataract extraction status, left eye: Secondary | ICD-10-CM | POA: Insufficient documentation

## 2017-05-30 DIAGNOSIS — E785 Hyperlipidemia, unspecified: Secondary | ICD-10-CM | POA: Insufficient documentation

## 2017-05-30 DIAGNOSIS — N138 Other obstructive and reflux uropathy: Secondary | ICD-10-CM

## 2017-05-30 DIAGNOSIS — E119 Type 2 diabetes mellitus without complications: Secondary | ICD-10-CM | POA: Diagnosis not present

## 2017-05-30 DIAGNOSIS — F17210 Nicotine dependence, cigarettes, uncomplicated: Secondary | ICD-10-CM | POA: Insufficient documentation

## 2017-05-30 DIAGNOSIS — Z9841 Cataract extraction status, right eye: Secondary | ICD-10-CM | POA: Insufficient documentation

## 2017-05-30 DIAGNOSIS — R351 Nocturia: Secondary | ICD-10-CM | POA: Diagnosis not present

## 2017-05-30 DIAGNOSIS — Z87440 Personal history of urinary (tract) infections: Secondary | ICD-10-CM | POA: Insufficient documentation

## 2017-05-30 HISTORY — PX: TRANSURETHRAL RESECTION OF PROSTATE: SHX73

## 2017-05-30 LAB — GLUCOSE, CAPILLARY
GLUCOSE-CAPILLARY: 126 mg/dL — AB (ref 65–99)
GLUCOSE-CAPILLARY: 113 mg/dL — AB (ref 65–99)
GLUCOSE-CAPILLARY: 221 mg/dL — AB (ref 65–99)
Glucose-Capillary: 185 mg/dL — ABNORMAL HIGH (ref 65–99)

## 2017-05-30 LAB — BASIC METABOLIC PANEL
ANION GAP: 9 (ref 5–15)
BUN: 24 mg/dL — ABNORMAL HIGH (ref 6–20)
CHLORIDE: 106 mmol/L (ref 101–111)
CO2: 27 mmol/L (ref 22–32)
CREATININE: 1.43 mg/dL — AB (ref 0.61–1.24)
Calcium: 8.6 mg/dL — ABNORMAL LOW (ref 8.9–10.3)
GFR calc non Af Amer: 47 mL/min — ABNORMAL LOW (ref >60)
GFR, EST AFRICAN AMERICAN: 55 mL/min — AB (ref >60)
Glucose, Bld: 136 mg/dL — ABNORMAL HIGH (ref 65–99)
Potassium: 4.3 mmol/L (ref 3.5–5.1)
SODIUM: 142 mmol/L (ref 135–145)

## 2017-05-30 LAB — CBC
HCT: 40.7 % (ref 39.0–52.0)
HEMOGLOBIN: 13.7 g/dL (ref 13.0–17.0)
MCH: 33.9 pg (ref 26.0–34.0)
MCHC: 33.7 g/dL (ref 30.0–36.0)
MCV: 100.7 fL — ABNORMAL HIGH (ref 78.0–100.0)
Platelets: 166 10*3/uL (ref 150–400)
RBC: 4.04 MIL/uL — AB (ref 4.22–5.81)
RDW: 13.2 % (ref 11.5–15.5)
WBC: 8.7 10*3/uL (ref 4.0–10.5)

## 2017-05-30 SURGERY — TURP (TRANSURETHRAL RESECTION OF PROSTATE)
Anesthesia: General

## 2017-05-30 MED ORDER — PROPOFOL 10 MG/ML IV BOLUS
INTRAVENOUS | Status: AC
  Filled 2017-05-30: qty 20

## 2017-05-30 MED ORDER — INSULIN ASPART 100 UNIT/ML ~~LOC~~ SOLN
0.0000 [IU] | Freq: Three times a day (TID) | SUBCUTANEOUS | Status: DC
  Administered 2017-05-30 – 2017-05-31 (×2): 3 [IU] via SUBCUTANEOUS

## 2017-05-30 MED ORDER — HYDROMORPHONE HCL 1 MG/ML IJ SOLN
INTRAMUSCULAR | Status: AC
  Filled 2017-05-30: qty 1

## 2017-05-30 MED ORDER — LACTATED RINGERS IV SOLN
INTRAVENOUS | Status: DC
  Administered 2017-05-30: 1000 mL via INTRAVENOUS

## 2017-05-30 MED ORDER — PROMETHAZINE HCL 25 MG/ML IJ SOLN
12.5000 mg | Freq: Once | INTRAMUSCULAR | Status: DC | PRN
Start: 2017-05-30 — End: 2017-05-30

## 2017-05-30 MED ORDER — NICOTINE 21 MG/24HR TD PT24: 21.0000 mg | MEDICATED_PATCH | Freq: Every day | TRANSDERMAL | Status: DC

## 2017-05-30 MED ORDER — AMIODARONE HCL 200 MG PO TABS
200.0000 mg | ORAL_TABLET | Freq: Every morning | ORAL | Status: DC
  Administered 2017-05-31: 200 mg via ORAL
  Filled 2017-05-30: qty 1

## 2017-05-30 MED ORDER — CEFAZOLIN SODIUM-DEXTROSE 2-4 GM/100ML-% IV SOLN
2.0000 g | INTRAVENOUS | Status: DC
  Filled 2017-05-30: qty 100

## 2017-05-30 MED ORDER — CARVEDILOL 6.25 MG PO TABS
6.2500 mg | ORAL_TABLET | Freq: Two times a day (BID) | ORAL | Status: DC
  Administered 2017-05-30 – 2017-05-31 (×2): 6.25 mg via ORAL
  Filled 2017-05-30 (×2): qty 1

## 2017-05-30 MED ORDER — PHENYLEPHRINE 40 MCG/ML (10ML) SYRINGE FOR IV PUSH (FOR BLOOD PRESSURE SUPPORT)
PREFILLED_SYRINGE | INTRAVENOUS | Status: AC
  Filled 2017-05-30: qty 10

## 2017-05-30 MED ORDER — HYDROMORPHONE HCL 1 MG/ML IJ SOLN
0.2500 mg | INTRAMUSCULAR | Status: DC | PRN
  Administered 2017-05-30 (×4): 0.5 mg via INTRAVENOUS

## 2017-05-30 MED ORDER — DIPHENHYDRAMINE HCL 12.5 MG/5ML PO ELIX: 12.5000 mg | ORAL_SOLUTION | Freq: Four times a day (QID) | ORAL | Status: DC | PRN

## 2017-05-30 MED ORDER — DEXAMETHASONE SODIUM PHOSPHATE 10 MG/ML IJ SOLN
INTRAMUSCULAR | Status: DC | PRN
  Administered 2017-05-30: 10 mg via INTRAVENOUS

## 2017-05-30 MED ORDER — OXYCODONE-ACETAMINOPHEN 5-325 MG PO TABS: 1.0000 | ORAL_TABLET | ORAL | Status: DC | PRN

## 2017-05-30 MED ORDER — 0.9 % SODIUM CHLORIDE (POUR BTL) OPTIME
TOPICAL | Status: DC | PRN
  Administered 2017-05-30: 1000 mL

## 2017-05-30 MED ORDER — FENTANYL CITRATE (PF) 100 MCG/2ML IJ SOLN
INTRAMUSCULAR | Status: AC
  Filled 2017-05-30: qty 2

## 2017-05-30 MED ORDER — SODIUM CHLORIDE 0.9 % IV SOLN
INTRAVENOUS | Status: AC
  Filled 2017-05-30: qty 20

## 2017-05-30 MED ORDER — FLUTICASONE PROPIONATE 50 MCG/ACT NA SUSP: 2.0000 | Freq: Every day | NASAL | Status: DC | PRN

## 2017-05-30 MED ORDER — FUROSEMIDE 40 MG PO TABS
40.0000 mg | ORAL_TABLET | ORAL | Status: DC
  Administered 2017-05-30 – 2017-05-31 (×2): 40 mg via ORAL
  Filled 2017-05-30 (×2): qty 1

## 2017-05-30 MED ORDER — MONTELUKAST SODIUM 10 MG PO TABS
10.0000 mg | ORAL_TABLET | Freq: Every day | ORAL | Status: DC
  Administered 2017-05-30: 10 mg via ORAL
  Filled 2017-05-30: qty 1

## 2017-05-30 MED ORDER — ZOLPIDEM TARTRATE 5 MG PO TABS
5.0000 mg | ORAL_TABLET | Freq: Every evening | ORAL | Status: DC | PRN
Start: 2017-05-30 — End: 2017-05-31

## 2017-05-30 MED ORDER — LIDOCAINE 2% (20 MG/ML) 5 ML SYRINGE
INTRAMUSCULAR | Status: DC | PRN
  Administered 2017-05-30: 100 mg via INTRAVENOUS

## 2017-05-30 MED ORDER — SODIUM CHLORIDE 0.9 % IR SOLN
Status: DC | PRN
  Administered 2017-05-30: 17000 mL via INTRAVESICAL

## 2017-05-30 MED ORDER — PNEUMOCOCCAL VAC POLYVALENT 25 MCG/0.5ML IJ INJ
0.5000 mL | INJECTION | INTRAMUSCULAR | Status: DC
  Filled 2017-05-30: qty 0.5

## 2017-05-30 MED ORDER — AZELASTINE HCL 0.1 % NA SOLN
1.0000 | Freq: Two times a day (BID) | NASAL | Status: DC
  Administered 2017-05-30 – 2017-05-31 (×2): 1 via NASAL
  Filled 2017-05-30: qty 30

## 2017-05-30 MED ORDER — HYDROMORPHONE HCL 1 MG/ML IJ SOLN: 0.5000 mg | INTRAMUSCULAR | Status: DC | PRN

## 2017-05-30 MED ORDER — FENTANYL CITRATE (PF) 100 MCG/2ML IJ SOLN
INTRAMUSCULAR | Status: DC | PRN
  Administered 2017-05-30: 25 ug via INTRAVENOUS
  Administered 2017-05-30: 50 ug via INTRAVENOUS
  Administered 2017-05-30: 25 ug via INTRAVENOUS
  Administered 2017-05-30: 50 ug via INTRAVENOUS

## 2017-05-30 MED ORDER — DEXAMETHASONE SODIUM PHOSPHATE 10 MG/ML IJ SOLN
INTRAMUSCULAR | Status: AC
  Filled 2017-05-30: qty 1

## 2017-05-30 MED ORDER — FENTANYL CITRATE (PF) 100 MCG/2ML IJ SOLN: 25.0000 ug | INTRAMUSCULAR | Status: DC | PRN

## 2017-05-30 MED ORDER — LOSARTAN POTASSIUM 25 MG PO TABS
25.0000 mg | ORAL_TABLET | Freq: Every day | ORAL | Status: DC
  Administered 2017-05-30: 25 mg via ORAL
  Filled 2017-05-30 (×2): qty 1

## 2017-05-30 MED ORDER — BELLADONNA ALKALOIDS-OPIUM 16.2-60 MG RE SUPP: 1.0000 | Freq: Four times a day (QID) | RECTAL | Status: DC | PRN

## 2017-05-30 MED ORDER — SODIUM CHLORIDE 0.9 % IV SOLN
2.0000 g | INTRAVENOUS | Status: DC
  Administered 2017-05-30: 2 g via INTRAVENOUS
  Filled 2017-05-30: qty 20

## 2017-05-30 MED ORDER — PROMETHAZINE HCL 25 MG/ML IJ SOLN: 6.2500 mg | INTRAMUSCULAR | Status: DC | PRN

## 2017-05-30 MED ORDER — PHENYLEPHRINE 40 MCG/ML (10ML) SYRINGE FOR IV PUSH (FOR BLOOD PRESSURE SUPPORT)
PREFILLED_SYRINGE | INTRAVENOUS | Status: DC | PRN
  Administered 2017-05-30 (×3): 80 ug via INTRAVENOUS

## 2017-05-30 MED ORDER — DIPHENHYDRAMINE HCL 50 MG/ML IJ SOLN: 12.5000 mg | Freq: Four times a day (QID) | INTRAMUSCULAR | Status: DC | PRN

## 2017-05-30 MED ORDER — ONDANSETRON HCL 4 MG/2ML IJ SOLN
INTRAMUSCULAR | Status: AC
  Filled 2017-05-30: qty 2

## 2017-05-30 MED ORDER — ALPRAZOLAM 0.25 MG PO TABS
0.2500 mg | ORAL_TABLET | Freq: Every evening | ORAL | Status: DC | PRN
  Filled 2017-05-30: qty 1

## 2017-05-30 MED ORDER — ALBUTEROL SULFATE HFA 108 (90 BASE) MCG/ACT IN AERS
INHALATION_SPRAY | RESPIRATORY_TRACT | Status: DC | PRN
  Administered 2017-05-30: 4 via RESPIRATORY_TRACT
  Administered 2017-05-30: 6 via RESPIRATORY_TRACT

## 2017-05-30 MED ORDER — PROPOFOL 10 MG/ML IV BOLUS
INTRAVENOUS | Status: DC | PRN
  Administered 2017-05-30: 50 mg via INTRAVENOUS
  Administered 2017-05-30: 200 mg via INTRAVENOUS

## 2017-05-30 MED ORDER — ONDANSETRON HCL 4 MG/2ML IJ SOLN
INTRAMUSCULAR | Status: DC | PRN
  Administered 2017-05-30: 4 mg via INTRAVENOUS

## 2017-05-30 MED ORDER — CEFAZOLIN SODIUM-DEXTROSE 2-3 GM-%(50ML) IV SOLR
INTRAVENOUS | Status: DC | PRN
  Administered 2017-05-30: 2 g via INTRAVENOUS

## 2017-05-30 MED ORDER — ATORVASTATIN CALCIUM 10 MG PO TABS
10.0000 mg | ORAL_TABLET | Freq: Every evening | ORAL | Status: DC
  Administered 2017-05-30: 10 mg via ORAL
  Filled 2017-05-30: qty 1

## 2017-05-30 MED ORDER — SODIUM CHLORIDE 0.9 % IV SOLN
INTRAVENOUS | Status: DC
  Administered 2017-05-30: 18:00:00 via INTRAVENOUS

## 2017-05-30 MED ORDER — ALBUTEROL SULFATE HFA 108 (90 BASE) MCG/ACT IN AERS
INHALATION_SPRAY | RESPIRATORY_TRACT | Status: AC
  Filled 2017-05-30: qty 6.7

## 2017-05-30 MED ORDER — ONDANSETRON HCL 4 MG/2ML IJ SOLN
4.0000 mg | INTRAMUSCULAR | Status: DC | PRN
  Administered 2017-05-30 (×2): 4 mg via INTRAVENOUS
  Filled 2017-05-30 (×2): qty 2

## 2017-05-30 SURGICAL SUPPLY — 18 items
BAG URINE DRAINAGE (UROLOGICAL SUPPLIES) ×2 IMPLANT
BAG URO CATCHER STRL LF (MISCELLANEOUS) ×2 IMPLANT
CATH FOLEY 3WAY 30CC 22FR (CATHETERS) ×2 IMPLANT
COVER FOOTSWITCH UNIV (MISCELLANEOUS) ×2 IMPLANT
COVER SURGICAL LIGHT HANDLE (MISCELLANEOUS) IMPLANT
ELECT REM PT RETURN 15FT ADLT (MISCELLANEOUS) ×2 IMPLANT
GLOVE BIOGEL M 8.0 STRL (GLOVE) ×2 IMPLANT
GOWN STRL REUS W/TWL LRG LVL3 (GOWN DISPOSABLE) ×2 IMPLANT
GOWN STRL REUS W/TWL XL LVL3 (GOWN DISPOSABLE) IMPLANT
HOLDER FOLEY CATH W/STRAP (MISCELLANEOUS) ×2 IMPLANT
LOOP CUT BIPOLAR 24F LRG (ELECTROSURGICAL) ×2 IMPLANT
MANIFOLD NEPTUNE II (INSTRUMENTS) ×2 IMPLANT
PACK CYSTO (CUSTOM PROCEDURE TRAY) ×2 IMPLANT
SYR 30ML LL (SYRINGE) ×2 IMPLANT
SYRINGE IRR TOOMEY STRL 70CC (SYRINGE) ×2 IMPLANT
TUBING CONNECTING 10 (TUBING) ×2 IMPLANT
TUBING UROLOGY SET (TUBING) ×2 IMPLANT
WATER STERILE IRR 500ML POUR (IV SOLUTION) ×2 IMPLANT

## 2017-05-30 NOTE — Anesthesia Procedure Notes (Signed)
Procedure Name: LMA Insertion Date/Time: 05/30/2017 11:50 AM Performed by: Sharlette Dense, CRNA Patient Re-evaluated:Patient Re-evaluated prior to induction Oxygen Delivery Method: Circle system utilized Preoxygenation: Pre-oxygenation with 100% oxygen Induction Type: IV induction Ventilation: Mask ventilation without difficulty LMA: LMA inserted LMA Size: 4.0 Number of attempts: 1 Placement Confirmation: positive ETCO2 and breath sounds checked- equal and bilateral Tube secured with: Tape Dental Injury: Teeth and Oropharynx as per pre-operative assessment

## 2017-05-30 NOTE — Discharge Instructions (Signed)
Indwelling Urinary Catheter Care, Adult  Take good care of your catheter to keep it working and to prevent problems.  How to wear your catheter  Attach your catheter to your leg with tape (adhesive tape) or a leg strap. Make sure it is not too tight. If you use tape, remove any bits of tape that are already on the catheter.  How to wear a drainage bag  You should have:   A large overnight bag.   A small leg bag.    Overnight Bag  You may wear the overnight bag at any time. Always keep the bag below the level of your bladder but off the floor. When you sleep, put a clean plastic bag in a wastebasket. Then hang the bag inside the wastebasket.  Leg Bag  Never wear the leg bag at night. Always wear the leg bag below your knee. Keep the leg bag secure with a leg strap or tape.  How to care for your skin   Clean the skin around the catheter at least once every day.   Shower every day. Do not take baths.   Put creams, lotions, or ointments on your genital area only as told by your doctor.   Do not use powders, sprays, or lotions on your genital area.  How to clean your catheter and your skin  1. Wash your hands with soap and water.  2. Wet a washcloth in warm water and gentle (mild) soap.  3. Use the washcloth to clean the skin where the catheter enters your body. Clean downward and wipe away from the catheter in small circles. Do not wipe toward the catheter.  4. Pat the area dry with a clean towel. Make sure to clean off all soap.  How to care for your drainage bags  Empty your drainage bag when it is ?- full or at least 2-3 times a day. Replace your drainage bag once a month or sooner if it starts to smell bad or look dirty. Do not clean your drainage bag unless told by your doctor.  Emptying a drainage bag    Supplies Needed   Rubbing alcohol.   Gauze pad or cotton ball.   Tape or a leg strap.    Steps  1. Wash your hands with soap and water.  2. Separate (detach) the bag from your leg.  3. Hold the bag over  the toilet or a clean container. Keep the bag below your hips and bladder. This stops pee (urine) from going back into the tube.  4. Open the pour spout at the bottom of the bag.  5. Empty the pee into the toilet or container. Do not let the pour spout touch any surface.  6. Put rubbing alcohol on a gauze pad or cotton ball.  7. Use the gauze pad or cotton ball to clean the pour spout.  8. Close the pour spout.  9. Attach the bag to your leg with tape or a leg strap.  10. Wash your hands.    Changing a drainage bag  Supplies Needed   Alcohol wipes.   A clean drainage bag.   Adhesive tape or a leg strap.    Steps  1. Wash your hands with soap and water.  2. Separate the dirty bag from your leg.  3. Pinch the rubber catheter with your fingers so that pee does not spill out.  4. Separate the catheter tube from the drainage tube where these tubes connect (at the   connection valve). Do not let the tubes touch any surface.  5. Clean the end of the catheter tube with an alcohol wipe. Use a different alcohol wipe to clean the end of the drainage tube.  6. Connect the catheter tube to the drainage tube of the clean bag.  7. Attach the new bag to the leg with adhesive tape or a leg strap.  8. Wash your hands.    How to prevent infection and other problems   Never pull on your catheter or try to remove it. Pulling can damage tissue in your body.   Always wash your hands before and after touching your catheter.   If a leg strap gets wet, replace it with a dry one.   Drink enough fluids to keep your pee clear or pale yellow, or as told by your doctor.   Do not let the drainage bag or tubing touch the floor.   Wear cotton underwear.   If you are male, wipe from front to back after you poop (have a bowel movement).   Check on the catheter often to make sure it works and the tubing is not twisted.  Get help if:   Your pee is cloudy.   Your pee smells unusually bad.   Your pee is not draining into the bag.   Your  tube gets clogged.   Your catheter starts to leak.   Your bladder feels full.  Get help right away if:   You have redness, swelling, or pain where the catheter enters your body.   You have fluid, pus, or a bad smell coming from the area where the catheter enters your body.   The area where the catheter enters your body feels warm.   You have a fever.   You have pain in your:  ? Stomach (abdomen).  ? Legs.  ? Lower back.  ? Bladder.   You see blood fill the catheter.   Your pee is pink or red.   You feel sick to your stomach (nauseous).   You throw up (vomit).   You have chills.   Your catheter gets pulled out.  This information is not intended to replace advice given to you by your health care provider. Make sure you discuss any questions you have with your health care provider.  Document Released: 07/21/2012 Document Revised: 02/22/2016 Document Reviewed: 09/08/2013  Elsevier Interactive Patient Education  2018 [employer]

## 2017-05-30 NOTE — Op Note (Signed)
Preoperative diagnosis: BPH with LUTS, recurrent UTIs  Postoperative diagnosis: same  Procedure: 1 cystoscopy 2. Transurethral resection of the prostate  Attending: Nicolette Bang  Anesthesia: General  Estimated blood loss: Minimal  Drains: 22 French foley  Specimens: 1. Prostate Chips  Antibiotics: Rocephin  Findings: Trilobar prostate enlargement. High bladder neck Ureteral orifices in normal anatomic location.   Indications: Patient is a 72 year old male with a history of BPH and recurrent UTIs related o incomplete emptying.  After discussing treatment options, they decided proceed with transurethral resection of the prostate.  Procedure her in detail: The patient was brought to the operating room and a brief timeout was done to ensure correct patient, correct procedure, correct site.  General anesthesia was administered patient was placed in dorsal lithotomy position.  Their genitalia was then prepped and draped in usual sterile fashion.  A rigid 82 French cystoscope was passed in the urethra and the bladder.  Bladder was inspected and we noted no masses or lesions.  the ureteral orifices were in the normal orthotopic locations. removed the cystoscope and placed a resectoscope into the bladder. We then turned our attention to the prostate resection. Using the bipolar resectoscope we resected the median lobe first from the bladder neck to the verumontanum. We then started at the 12 oclock position on the left lobe and resection to the 6 o'clock position from the bladder neck to the verumontanum. We then did the same resection of the right lobe. Once the resection was complete we then cauterized individual bleeders. We then removed the prostate chips and sent them for pathology.  We then re-inspected the prostatic fossa and found no residual bleeding.  the bladder was then drained, a 22 French foley was placed and this concluded the procedure which was well tolerated by  patient.  Complications: None  Condition: Stable, extubated, transferred to PACU  Plan: Patient is admitted overnight with continuous bladder irrigation. If their urine is clear tomorrow they will be discharged home and followup in 5 days for foley catheter removal and pathology discussion.

## 2017-05-30 NOTE — Anesthesia Preprocedure Evaluation (Signed)
Anesthesia Evaluation  Patient identified by MRN, date of birth, ID band Patient awake    Reviewed: Allergy & Precautions, NPO status , Patient's Chart, lab work & pertinent test results  Airway Mallampati: II  TM Distance: >3 FB Neck ROM: Full    Dental no notable dental hx.    Pulmonary sleep apnea , COPD, Current Smoker,    Pulmonary exam normal breath sounds clear to auscultation       Cardiovascular hypertension, +CHF  Normal cardiovascular exam+ dysrhythmias Atrial Fibrillation  Rhythm:Regular Rate:Normal  - Left ventricle: The cavity size was normal. Wall thickness was   normal. Systolic function was mildly to moderately reduced. The   estimated ejection fraction was in the range of 40% to 45%. Mild   diffuse hypokinesis with no identifiable regional variations.   Left ventricular diastolic function parameters were normal. - Left atrium: The atrium was moderately dilated. - Right atrium: The atrium was moderately dilated.   Neuro/Psych negative neurological ROS  negative psych ROS   GI/Hepatic negative GI ROS, Neg liver ROS,   Endo/Other  diabetes  Renal/GU negative Renal ROS  negative genitourinary   Musculoskeletal negative musculoskeletal ROS (+)   Abdominal   Peds negative pediatric ROS (+)  Hematology negative hematology ROS (+)   Anesthesia Other Findings   Reproductive/Obstetrics negative OB ROS                             Anesthesia Physical Anesthesia Plan  ASA: III  Anesthesia Plan: General   Post-op Pain Management:    Induction: Intravenous  PONV Risk Score and Plan: 2 and Ondansetron, Dexamethasone and Treatment may vary due to age or medical condition  Airway Management Planned: LMA  Additional Equipment:   Intra-op Plan:   Post-operative Plan: Extubation in OR  Informed Consent: I have reviewed the patients History and Physical, chart, labs and  discussed the procedure including the risks, benefits and alternatives for the proposed anesthesia with the patient or authorized representative who has indicated his/her understanding and acceptance.   Dental advisory given  Plan Discussed with: CRNA and Surgeon  Anesthesia Plan Comments:         Anesthesia Quick Evaluation

## 2017-05-30 NOTE — Transfer of Care (Signed)
Immediate Anesthesia Transfer of Care Note  Patient: Ronald [name]  Procedure(s) Performed: TRANSURETHRAL RESECTION OF THE PROSTATE (TURP) (N/A )  Patient Location: PACU  Anesthesia Type:General  Level of Consciousness: awake, alert  and oriented  Airway & Oxygen Therapy: Patient Spontanous Breathing and Patient connected to face mask oxygen  Post-op Assessment: Report given to RN and Post -op Vital signs reviewed and stable  Post vital signs: Reviewed and stable  Last Vitals:  Vitals:   05/30/17 0946  BP: (!) 149/89  Pulse: 78  Resp: 18  Temp: 36.8 C  SpO2: 97%    Last Pain:  Vitals:   05/30/17 0946  TempSrc: Oral      Patients Stated Pain Goal: 4 (15/05/69 7948)  Complications: No apparent anesthesia complications

## 2017-05-30 NOTE — H&P (Signed)
Urology Admission H&P  Chief Complaint: recurrent UTIs, urinary urgency  History of Present Illness: Ronald [name] is a 72yo with a hx of BPH and recurrent UTIs here for TURP. He has failed medical therapy. He has severe urinary urgency, frequency, nocturia and a feeling of incomplete emptying. No other associated symptoms  Past Medical History:  Diagnosis Date  . Allergic rhinitis   . Anticoagulant long-term use    eliquis  . BPH (benign prostatic hyperplasia)   . COPD with emphysema [facility])    pulmologist-  dr Halford Chessman  . Dyspnea    occasional per pt  . History of colon polyps    tubular adenoma 2013  . History of gout    last episode early June 2017-- (feet)  . History of sepsis 02/18/2017   per d/c note probable uti, acute chf, acute renal failure, hypoxia  . Hyperlipidemia   . Hypertension   . Nocturia    flomax has improved symptoms  . OSA on CPAP    per study 08-03-2004  Severe OSA  . Persistent atrial fibrillation [facility])    cardiologist --  dr Dorris Carnes--  post cardioversion 05-07-2014  . Prostate cancer [facility]) urologsit-  dr Alyson Ingles--- as of 05-21-2017 per pt last PSA 11 approx.   Dx  2014--  stage T1c, Gleason 3+3=6, PSA 6.67--  Active surveillance  . 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 Seasonal allergies   . Sigmoid diverticulosis   . Systolic and diastolic CHF, chronic ([place])   . Tinea versicolor   . Type 2 diabetes mellitus ([place])   . Urgency of urination    improved on flomax  . Wears hearing aid in both ears    Past Surgical History:  Procedure Laterality Date  . CARDIOVERSION N/A 05/07/2014   Procedure: CARDIOVERSION;  Surgeon: [name], MD;  Location: [facility] ENDOSCOPY;  Service: Cardiovascular;  Laterality: N/A;  . CARDIOVERSION N/A 09/09/2014   Procedure: CARDIOVERSION;  Surgeon: Lelon Perla, MD;  Location: [facility] ENDOSCOPY;  Service: Cardiovascular;  Laterality: N/A;  successfully  . CATARACT  EXTRACTION W/ INTRAOCULAR LENS  IMPLANT, BILATERAL  2018  . COLONOSCOPY  last one 06-07-2011  . HYDROCELE EXCISION Bilateral 09/26/2015   Procedure: HYDROCELECTOMY ADULT;  Surgeon: [name], MD;  Location: [facility];  Service: Urology;  Laterality: Bilateral;  . LAMINECTOMY AND MICRODISCECTOMY LUMBAR SPINE  12-23-2003   Left L5 -- S1  . LAPAROSCOPIC BILATERAL INGUINAL HERNIA REPAIR/  UMBILICAL HERNIA REPAIR WITH MESH/  ASPIRATION LEFT HYDROCELE  07-11-2012  . NM MYOVIEW LTD  05/18/2014   Low risk study. Normal perfusion: No ischemia or infarction. Mild LV dysfunction - 46% (does not correlate with echocardiographic EF of 50-55%)  . PROSTATE BIOPSY  05/15/12   Clinically both Lobes  . TEE WITHOUT CARDIOVERSION N/A 05/07/2014   Procedure: TRANSESOPHAGEAL ECHOCARDIOGRAM (TEE);  Surgeon: [name], MD;  Location: [facility] ENDOSCOPY;  Service: Cardiovascular;  Laterality: N/A;   mild atherosclerosis plaque of aorta,  mild AR, Ronald, and TR,  no cardiac source of emboli  . TONSILLECTOMY  as child  . TRANSTHORACIC ECHOCARDIOGRAM  11/'18; 1/'19   a) In setting of sepsis: EF of 40-45%.  Diffuse hypokinesis.  No RWMA.  Biatrial enlargement.;; b) f/u Jan 2019: Normal LVF 55-60%, mildly dilated aortic root(38 mm) and ascending aorta (44 mm). Compared to prior echo, LVEF has improved.    Home Medications:  Current Facility-Administered Medications  Medication Dose Route Frequency Provider Last Rate Last Dose  . ceFAZolin (ANCEF) IVPB 2g/100 mL premix  2 g Intravenous 30 min Pre-Op Akashdeep Chuba, Candee Furbish, MD      . lactated ringers infusion   Intravenous Continuous Audry Pili, MD 100 mL/hr at 05/30/17 1011 1,000 mL at 05/30/17 1011   Allergies: No Known Allergies  Family History  Problem Relation Age of Onset  . Breast cancer Mother   . Stroke Unknown        Unknown   . Ovarian cancer Sister   . Colon cancer Neg Hx   . Esophageal cancer Neg Hx   . Rectal cancer Neg Hx   .  Stomach cancer Neg Hx    Social History:  reports that he has been smoking cigarettes.  He has a 60.00 pack-year smoking history. he has never used smokeless tobacco. He reports that he drinks about 8.4 oz of alcohol per week. He reports that he does not use drugs.  Review of Systems  Genitourinary: Positive for frequency and urgency.  All other systems reviewed and are negative.   Physical Exam:  Vital signs in last 24 hours: Temp:  [98.3 F (36.8 C)] 98.3 F (36.8 C) (02/21 0946) Pulse Rate:  [78] 78 (02/21 0946) Resp:  [18] 18 (02/21 0946) BP: (149)/(89) 149/89 (02/21 0946) SpO2:  [97 %] 97 % (02/21 0946) Weight:  [103 kg (227 lb)] 103 kg (227 lb) (02/21 1008) Physical Exam  Constitutional: He is oriented to person, place, and time. He appears well-developed and well-nourished.  HENT:  Head: Normocephalic and atraumatic.  Eyes: EOM are normal. Pupils are equal, round, and reactive to light.  Neck: Normal range of motion. No thyromegaly present.  Cardiovascular: Normal rate and regular rhythm.  Respiratory: Effort normal. No respiratory distress.  GI: Soft. He exhibits no distension.  Musculoskeletal: Normal range of motion. He exhibits no edema.  Neurological: He is alert and oriented to person, place, and time.  Skin: Skin is warm and dry.  Psychiatric: He has a normal mood and affect. His behavior is normal. Judgment and thought content normal.    Laboratory Data:  Results for orders placed or performed during the hospital encounter of 05/30/17 (from the past 24 hour(s))  Glucose, capillary     Status: Abnormal   Collection Time: 05/30/17  9:49 AM  Result Value Ref Range   Glucose-Capillary 126 (H) 65 - 99 mg/dL   Comment 1 Notify RN    No results found for this or any previous visit (from the past 240 hour(s)). Creatinine: No results for input(s): CREATININE in the last 168 hours. Baseline Creatinine: unknwon  Impression/Assessment:  72yo with BPH with LUTS,  recurrent UTIs and urinary urgency  Plan:  The risks/benefits/alternatives to TURP was explained to the patient and he understands and wishes to proceed with surgery  Nicolette Bang 05/30/2017, 10:32 AM

## 2017-05-30 NOTE — Anesthesia Postprocedure Evaluation (Signed)
Anesthesia Post Note  Patient: Ronald [name]  Procedure(s) Performed: TRANSURETHRAL RESECTION OF THE PROSTATE (TURP) (N/A )     Patient location during evaluation: PACU Anesthesia Type: General Level of consciousness: awake and alert Pain management: pain level controlled Vital Signs Assessment: post-procedure vital signs reviewed and stable Respiratory status: spontaneous breathing, nonlabored ventilation, respiratory function stable and patient connected to nasal cannula oxygen Cardiovascular status: blood pressure returned to baseline and stable Postop Assessment: no apparent nausea or vomiting Anesthetic complications: no    Last Vitals:  Vitals:   05/30/17 0946 05/30/17 1306  BP: (!) 149/89 (!) 132/93  Pulse: 78 78  Resp: 18 19  Temp: 36.8 C 36.9 C  SpO2: 97% 95%    Last Pain:  Vitals:   05/30/17 1330  TempSrc:   PainSc: 4                  [name] S

## 2017-05-31 ENCOUNTER — Encounter (HOSPITAL_COMMUNITY): Payer: Self-pay | Admitting: Urology

## 2017-05-31 DIAGNOSIS — N401 Enlarged prostate with lower urinary tract symptoms: Secondary | ICD-10-CM | POA: Diagnosis not present

## 2017-05-31 DIAGNOSIS — E785 Hyperlipidemia, unspecified: Secondary | ICD-10-CM | POA: Diagnosis not present

## 2017-05-31 DIAGNOSIS — Z79899 Other long term (current) drug therapy: Secondary | ICD-10-CM | POA: Diagnosis not present

## 2017-05-31 DIAGNOSIS — R0600 Dyspnea, unspecified: Secondary | ICD-10-CM | POA: Diagnosis not present

## 2017-05-31 DIAGNOSIS — Z86010 Personal history of colonic polyps: Secondary | ICD-10-CM | POA: Diagnosis not present

## 2017-05-31 DIAGNOSIS — G4733 Obstructive sleep apnea (adult) (pediatric): Secondary | ICD-10-CM | POA: Diagnosis not present

## 2017-05-31 DIAGNOSIS — J439 Emphysema, unspecified: Secondary | ICD-10-CM | POA: Diagnosis not present

## 2017-05-31 DIAGNOSIS — R351 Nocturia: Secondary | ICD-10-CM | POA: Diagnosis not present

## 2017-05-31 DIAGNOSIS — C61 Malignant neoplasm of prostate: Secondary | ICD-10-CM | POA: Diagnosis not present

## 2017-05-31 DIAGNOSIS — J309 Allergic rhinitis, unspecified: Secondary | ICD-10-CM | POA: Diagnosis not present

## 2017-05-31 DIAGNOSIS — Z7901 Long term (current) use of anticoagulants: Secondary | ICD-10-CM | POA: Diagnosis not present

## 2017-05-31 DIAGNOSIS — R3915 Urgency of urination: Secondary | ICD-10-CM | POA: Diagnosis not present

## 2017-05-31 DIAGNOSIS — I481 Persistent atrial fibrillation: Secondary | ICD-10-CM | POA: Diagnosis not present

## 2017-05-31 DIAGNOSIS — R350 Frequency of micturition: Secondary | ICD-10-CM | POA: Diagnosis not present

## 2017-05-31 DIAGNOSIS — M109 Gout, unspecified: Secondary | ICD-10-CM | POA: Diagnosis not present

## 2017-05-31 DIAGNOSIS — R3914 Feeling of incomplete bladder emptying: Secondary | ICD-10-CM | POA: Diagnosis not present

## 2017-05-31 LAB — BASIC METABOLIC PANEL
Anion gap: 11 (ref 5–15)
BUN: 20 mg/dL (ref 6–20)
CHLORIDE: 104 mmol/L (ref 101–111)
CO2: 24 mmol/L (ref 22–32)
Calcium: 8.6 mg/dL — ABNORMAL LOW (ref 8.9–10.3)
Creatinine, Ser: 1.16 mg/dL (ref 0.61–1.24)
GFR calc non Af Amer: >60 mL/min (ref >60)
Glucose, Bld: 178 mg/dL — ABNORMAL HIGH (ref 65–99)
POTASSIUM: 3.9 mmol/L (ref 3.5–5.1)
SODIUM: 139 mmol/L (ref 135–145)

## 2017-05-31 LAB — CBC
HEMATOCRIT: 39.2 % (ref 39.0–52.0)
HEMOGLOBIN: 13.1 g/dL (ref 13.0–17.0)
MCH: 33.2 pg (ref 26.0–34.0)
MCHC: 33.4 g/dL (ref 30.0–36.0)
MCV: 99.5 fL (ref 78.0–100.0)
Platelets: 173 10*3/uL (ref 150–400)
RBC: 3.94 MIL/uL — AB (ref 4.22–5.81)
RDW: 13 % (ref 11.5–15.5)
WBC: 8 10*3/uL (ref 4.0–10.5)

## 2017-05-31 LAB — GLUCOSE, CAPILLARY: GLUCOSE-CAPILLARY: 171 mg/dL — AB (ref 65–99)

## 2017-05-31 MED ORDER — OXYCODONE-ACETAMINOPHEN 5-325 MG PO TABS: 1.0000 | ORAL_TABLET | ORAL | 0 refills | Status: DC | PRN

## 2017-05-31 MED ORDER — APIXABAN 5 MG PO TABS: 5.0000 mg | ORAL_TABLET | Freq: Two times a day (BID) | ORAL | 0 refills | Status: DC

## 2017-05-31 NOTE — Progress Notes (Signed)
Pt discharged to home, TURP teaching completed with pt, wife at bedside and reviewed information, acknowledge understanding. SRP, RN

## 2017-06-07 ENCOUNTER — Ambulatory Visit (INDEPENDENT_AMBULATORY_CARE_PROVIDER_SITE_OTHER): Payer: Federal, State, Local not specified - PPO | Admitting: Family Medicine

## 2017-06-07 ENCOUNTER — Encounter: Payer: Self-pay | Admitting: Family Medicine

## 2017-06-07 VITALS — BP 120/80 | HR 72 | Temp 98.3°F | Wt 227.3 lb

## 2017-06-07 DIAGNOSIS — I10 Essential (primary) hypertension: Secondary | ICD-10-CM

## 2017-06-07 DIAGNOSIS — I4819 Other persistent atrial fibrillation: Secondary | ICD-10-CM

## 2017-06-07 DIAGNOSIS — L570 Actinic keratosis: Secondary | ICD-10-CM | POA: Insufficient documentation

## 2017-06-07 DIAGNOSIS — I481 Persistent atrial fibrillation: Secondary | ICD-10-CM

## 2017-06-07 DIAGNOSIS — E1140 Type 2 diabetes mellitus with diabetic neuropathy, unspecified: Secondary | ICD-10-CM

## 2017-06-07 LAB — POCT GLYCOSYLATED HEMOGLOBIN (HGB A1C): HEMOGLOBIN A1C: 6

## 2017-06-07 NOTE — Progress Notes (Signed)
Subjective:     Patient ID: Ronald [name], male   DOB: 1946-03-26, 72 y.o.   MRN: [id number]  HPI Patient seen for medical follow-up. He has history of prostate cancer, BPH, type 2 diabetes, hypertension, atrial fibrillation, chronic anticoagulation. Refer to recent notes. He had admission back in November with sepsis type picture. He developed acute renal failure and basically his kidney function did not really improve much until after his recent TURP which was little over week ago. His catheter was removed yesterday and he is urinating frequently but with good flow. No fevers or chills. He had follow-up creatinine done last week which was 1.16 which is back to baseline. We had planned to set up for him see nephrology but at this point feel this could be discontinued  He was taken off metformin for diabetes during his hospitalization and currently on glipizide 5 mg one half tablet twice daily. A1c is excellent today at 6.0%. No recent hypoglycemic symptoms. Medications reviewed. Compliant with all. Blood pressure been stable.  He has multiple hyperkeratotic lesions on both hands. These been treated in the past with liquid nitrogen. Also has nodular lesion right forearm which he just recently noticed  Past Medical History:  Diagnosis Date  . Allergic rhinitis   . Anticoagulant long-term use    eliquis  . BPH (benign prostatic hyperplasia)   . COPD with emphysema [facility])    pulmologist-  dr Halford Chessman  . Dyspnea    occasional per pt  . History of colon polyps    tubular adenoma 2013  . History of gout    last episode early June 2017-- (feet)  . History of sepsis 02/18/2017   per d/c note probable uti, acute chf, acute renal failure, hypoxia  . Hyperlipidemia   . Hypertension   . Nocturia    flomax has improved symptoms  . OSA on CPAP    per study 08-03-2004  Severe OSA  . Persistent atrial fibrillation [facility])    cardiologist --  dr Dorris Carnes--  post cardioversion 05-07-2014  . Prostate  cancer [facility]) urologsit-  dr Alyson Ingles--- as of 05-21-2017 per pt last PSA 11 approx.   Dx  2014--  stage T1c, Gleason 3+3=6, PSA 6.67--  Active surveillance  . 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 ([place])    pulmologist-  dr Halford Chessman  . Seasonal allergies   . Sigmoid diverticulosis   . Systolic and diastolic CHF, chronic ([place])   . Tinea versicolor   . Type 2 diabetes mellitus ([place])   . Urgency of urination    improved on flomax  . Wears hearing aid in both ears    Past Surgical History:  Procedure Laterality Date  . CARDIOVERSION N/A 05/07/2014   Procedure: CARDIOVERSION;  Surgeon: [name], MD;  Location: [facility] ENDOSCOPY;  Service: Cardiovascular;  Laterality: N/A;  . CARDIOVERSION N/A 09/09/2014   Procedure: CARDIOVERSION;  Surgeon: Lelon Perla, MD;  Location: [facility] ENDOSCOPY;  Service: Cardiovascular;  Laterality: N/A;  successfully  . CATARACT EXTRACTION W/ INTRAOCULAR LENS  IMPLANT, BILATERAL  2018  . COLONOSCOPY  last one 06-07-2011  . HYDROCELE EXCISION Bilateral 09/26/2015   Procedure: HYDROCELECTOMY ADULT;  Surgeon: [name], MD;  Location: [facility];  Service: Urology;  Laterality: Bilateral;  . LAMINECTOMY AND MICRODISCECTOMY LUMBAR SPINE  12-23-2003   Left L5 -- S1  . LAPAROSCOPIC BILATERAL INGUINAL HERNIA REPAIR/  UMBILICAL HERNIA REPAIR WITH MESH/  ASPIRATION LEFT HYDROCELE  07-11-2012  . NM MYOVIEW  LTD  05/18/2014   Low risk study. Normal perfusion: No ischemia or infarction. Mild LV dysfunction - 46% (does not correlate with echocardiographic EF of 50-55%)  . PROSTATE BIOPSY  05/15/12   Clinically both [place]  . TEE WITHOUT CARDIOVERSION N/A 05/07/2014   Procedure: TRANSESOPHAGEAL ECHOCARDIOGRAM (TEE);  Surgeon: [name], MD;  Location: [facility] ENDOSCOPY;  Service: Cardiovascular;  Laterality: N/A;   mild atherosclerosis plaque of aorta,  mild AR, MR, and TR,  no cardiac source of emboli  . TONSILLECTOMY  as child  .  TRANSTHORACIC ECHOCARDIOGRAM  11/'18; 1/'19   a) In setting of sepsis: EF of 40-45%.  Diffuse hypokinesis.  No RWMA.  Biatrial enlargement.;; b) f/u Jan 2019: Normal LVF 55-60%, mildly dilated aortic root(38 mm) and ascending aorta (44 mm). Compared to prior echo, LVEF has improved.  . TRANSURETHRAL RESECTION OF PROSTATE N/A 05/30/2017   Procedure: TRANSURETHRAL RESECTION OF THE PROSTATE (TURP);  Surgeon: [name], MD;  Location: [facility] ORS;  Service: Urology;  Laterality: N/A;    reports that he has been smoking cigarettes.  He has a 60.00 pack-year smoking history. he has never used smokeless tobacco. He reports that he drinks about 8.4 oz of alcohol per week. He reports that he does not use drugs. family history includes Breast cancer in his mother; Ovarian cancer in his sister; Stroke in his unknown relative. No Known Allergies   Review of Systems  Constitutional: Negative for chills, fatigue, fever and unexpected weight change.  Eyes: Negative for visual disturbance.  Respiratory: Negative for cough, chest tightness and shortness of breath.   Cardiovascular: Negative for chest pain, palpitations and leg swelling.  Neurological: Negative for dizziness, syncope, weakness, light-headedness and headaches.       Objective:   Physical Exam  Constitutional: He is oriented to person, place, and time. He appears well-developed and well-nourished.  HENT:  Right Ear: External ear normal.  Left Ear: External ear normal.  Mouth/Throat: Oropharynx is clear and moist.  Eyes: Pupils are equal, round, and reactive to light.  Neck: Neck supple. No thyromegaly present.  Cardiovascular: Normal rate and regular rhythm.  Pulmonary/Chest: Effort normal and breath sounds normal. No respiratory distress. He has no wheezes. He has no rales.  Musculoskeletal: He exhibits no edema.  Neurological: He is alert and oriented to person, place, and time.  Skin:  Patient has approximately 8 mm nodular  lesion right dorsal forearm.  He has multiple hyperkeratotic lesions on the dorsum of the right and left hand consistent with actinic keratoses. Cannot rule out early squamous cell       Assessment:     #1 type 2 diabetes well-controlled A1c 6.0%  #2 hypertension stable and at goal  #3 history of atrial fibrillation. Clinically appears to be in sinus rhythm today. He remains on eliquis  #4 recent acute renal failure and it appears his kidney function has finally normalized. His acute kidney failure likely related to sepsis with also potential issues related to medications ([facility]) and BPH  #5 multiple actinic keratoses on both hands. Squamous cell carcinoma versus basal cell right forearm    Plan:     -We discussed possible reduction in glipizide. He has not had any hypoglycemic symptoms though and we will leave dosage same but he will drop back to once daily for any hypoglycemic symptoms -Consider repeat basic metabolic panel at follow-up in one month -We discussed pros and cons of treatment with liquid nitrogen for his hyperkeratotic lesions on both  hands. We discussed potential risks including pain, blistering, redness, infection and patient consented. We treated 2 lesions on dorsum right hand in total 5 on the left hand. Patient tolerated well -Schedule one-month follow-up to biopsy nodular lesion right forearm  [name] MD [facility]

## 2017-06-10 NOTE — Discharge Summary (Signed)
Physician Discharge Summary  Patient ID: Ronald [name] MRN: [id number] DOB/AGE: 1945-06-07 72 y.o.  Admit date: 05/30/2017 Discharge date: 05/31/2017 Admission Diagnoses: BPH Discharge Diagnoses:  Active Problems:   BPH (benign prostatic hyperplasia)   Discharged Condition: good  Hospital Course: The patient tolerated the procedure well and was transferred to the floor on IV pain meds, IV fluid. On POD#1 pt was started on regular diet and they ambulated in the halls.  Prior to discharge the pt was tolerating a regular diet, pain was controlled on PO pain meds, they were ambulating without difficulty, and they had normal bowel function.   Consults: None  Significant Diagnostic Studies: none  Treatments: surgery: TURP  Discharge Exam: Blood pressure (!) 148/77, pulse 65, temperature 98 F (36.7 C), temperature source Oral, resp. rate 16, height 5\' 11"  (1.803 m), weight 103 kg (227 lb), SpO2 96 %. General appearance: alert, cooperative and appears stated age Head: Normocephalic, without obvious abnormality, atraumatic Nose: Nares normal. Septum midline. Mucosa normal. No drainage or sinus tenderness. Neck: no adenopathy, no carotid bruit, no JVD, supple, symmetrical, trachea midline and thyroid not enlarged, symmetric, no tenderness/mass/nodules Resp: clear to auscultation bilaterally Cardio: regular rate and rhythm, S1, S2 normal, no murmur, click, rub or gallop GI: soft, non-tender; bowel sounds normal; no masses,  no organomegaly Extremities: extremities normal, atraumatic, no cyanosis or edema Neurologic: Grossly normal  Disposition: 01-Home or Self Care  Discharge Instructions    Discharge patient   Complete by:  As directed    Discharge disposition:  01-Home or Self Care   Discharge patient date:  05/31/2017     Allergies as of 05/31/2017   No Known Allergies     Medication List    STOP taking these medications   tamsulosin 0.4 MG Caps capsule Commonly known as:   FLOMAX     TAKE these medications   accu-chek soft touch lancets Check blood sugars once per day. DX: E11.9   acetaminophen 500 MG tablet Commonly known as:  TYLENOL Take 500 mg by mouth every 6 (six) hours as needed for mild pain or moderate pain.   ALPRAZolam 0.25 MG tablet Commonly known as:  XANAX Take 1 tablet (0.25 mg total) at bedtime as needed by mouth for anxiety or sleep.   amiodarone 200 MG tablet Commonly known as:  PACERONE Take 1 tablet (200 mg total) by mouth daily. What changed:  when to take this   apixaban 5 MG Tabs tablet Commonly known as:  ELIQUIS Take 1 tablet (5 mg total) by mouth 2 (two) times daily.   atorvastatin 10 MG tablet Commonly known as:  LIPITOR TAKE 1 TABLET (10 MG TOTAL) BY MOUTH EVERY EVENING.   azelastine 0.1 % nasal spray Commonly known as:  ASTELIN Place 1 spray into both nostrils 2 (two) times daily. Use in each nostril as directed   carvedilol 6.25 MG tablet Commonly known as:  COREG TAKE 1 TABLET BY MOUTH TWICE A DAY WITH MEAL   CLEAR EYES OP Place 1-2 drops into both eyes 3 (three) times daily as needed (for dry/irritated eyes).   colchicine 0.6 MG tablet Two tabs at onset of gout flare and then one tab twice daily as needed What changed:    how much to take  how to take this  when to take this  reasons to take this  additional instructions   fluticasone 50 MCG/ACT nasal spray Commonly known as:  FLONASE Place 2 sprays into both nostrils daily as needed  for allergies or rhinitis.   furosemide 40 MG tablet Commonly known as:  LASIX Take 1 tablet (40 mg total) 2 (two) times daily by mouth. Take 1 tablet in the morning and second tablet at 2pm daily.   glipiZIDE 5 MG tablet Commonly known as:  GLUCOTROL TAKE 1/2 TABLETS BY MOUTH TWICE DAILY What changed:  See the new instructions.   glucose blood test strip Commonly known as:  ACCU-CHEK AVIVA PLUS Test once daily dx e11.9   hydrocortisone 2.5 % rectal  cream Commonly known as:  ANUSOL-HC Apply 2 (two) times daily topically. What changed:    how much to take  when to take this  reasons to take this   KLOR-CON M20 20 MEQ tablet Generic drug:  potassium chloride SA TAKE 2 TABLETS BY MOUTH DAILY What changed:    how much to take  how to take this  when to take this   losartan 25 MG tablet Commonly known as:  COZAAR Take 1 tablet (25 mg total) by mouth daily.   montelukast 10 MG tablet Commonly known as:  SINGULAIR Take 1 tablet (10 mg total) by mouth at bedtime.   nicotine 21 mg/24hr patch Commonly known as:  NICODERM CQ - dosed in mg/24 hours Place 1 patch (21 mg total) daily onto the skin.   oxyCODONE-acetaminophen 5-325 MG tablet Commonly known as:  PERCOCET/ROXICET Take 1 tablet by mouth every 4 (four) hours as needed for moderate pain.      Follow-up Information    Angelos Wasco, Candee Furbish, MD. Call in 1 week.   Specialty:  Urology Contact information: [address] [place] [address] [phone]           Signed: Nicolette Bang 06/10/2017, 8:10 AM

## 2017-06-12 ENCOUNTER — Encounter: Payer: Self-pay | Admitting: Radiation Oncology

## 2017-06-18 ENCOUNTER — Encounter: Payer: Self-pay | Admitting: Radiation Oncology

## 2017-06-18 NOTE — Progress Notes (Addendum)
GU Location of Tumor / Histology: prostatic adenocarcinoma  If Prostate Cancer, Gleason Score is (3 + 4) and PSA is (12.80) on 04/25/2017. TURP done 05/30/2017.   Ronald [name] was diagnosed with prostate cancer 05/25/2012. Patient consulted with Ronald [name] on 06/02/2012 but opted for active surveillance.   04/25/2017 PSA  12.80 12/25/2016 PSA  10.60 12/13/2016 PSA  10.90 02/14/2016 PSA  6.45 11/11/2015 PSA  5.35 02/15/2015 PSA  6.49 08/19/2014 PSA  6.14 01/25/2014 PSA  7.24     Past/Anticipated interventions by urology, if any: biopsy, referral to Ronald [name], active surveillance, TURP (done 05/30/2017), referral back to Ronald [name]  Past/Anticipated interventions by medical oncology, if any: no  Weight changes, if any: no  Bowel/Bladder complaints, if any: dysuria is improving, reports hematuria, reports yesterday he was unable to void until he passed a clot, reports urinary leakage, reports urinary frequency (lasix bid)  Nausea/Vomiting, if any: no  Pain issues, if any:  no  SAFETY ISSUES:  Prior radiation? no  Pacemaker/ICD? no  Possible current pregnancy? no  Is the patient on methotrexate? no  Current Complaints / other details:  72 year old male. Married. Resides in [place]. Patient has two children but not with current wife. NKDA. Current everyday smoker. Scheduled to follow up with Ronald [name] 07/04/2017.

## 2017-06-20 ENCOUNTER — Other Ambulatory Visit: Payer: Self-pay

## 2017-06-20 ENCOUNTER — Ambulatory Visit
Admission: RE | Admit: 2017-06-20 | Discharge: 2017-06-20 | Disposition: A | Discharge status: Home / Self Care | Payer: Federal, State, Local not specified - PPO | Source: Ambulatory Visit | Attending: Radiation Oncology | Admitting: Radiation Oncology

## 2017-06-20 ENCOUNTER — Encounter: Payer: Self-pay | Admitting: Radiation Oncology

## 2017-06-20 VITALS — BP 146/89 | HR 66 | Resp 18 | Ht 71.0 in | Wt 229.0 lb

## 2017-06-20 DIAGNOSIS — Z86010 Personal history of colonic polyps: Secondary | ICD-10-CM | POA: Insufficient documentation

## 2017-06-20 DIAGNOSIS — K5730 Diverticulosis of large intestine without perforation or abscess without bleeding: Secondary | ICD-10-CM | POA: Insufficient documentation

## 2017-06-20 DIAGNOSIS — F17210 Nicotine dependence, cigarettes, uncomplicated: Secondary | ICD-10-CM | POA: Diagnosis not present

## 2017-06-20 DIAGNOSIS — Z7901 Long term (current) use of anticoagulants: Secondary | ICD-10-CM | POA: Insufficient documentation

## 2017-06-20 DIAGNOSIS — C61 Malignant neoplasm of prostate: Secondary | ICD-10-CM | POA: Insufficient documentation

## 2017-06-20 DIAGNOSIS — J439 Emphysema, unspecified: Secondary | ICD-10-CM | POA: Diagnosis not present

## 2017-06-20 DIAGNOSIS — N400 Enlarged prostate without lower urinary tract symptoms: Secondary | ICD-10-CM | POA: Diagnosis not present

## 2017-06-20 DIAGNOSIS — Z79899 Other long term (current) drug therapy: Secondary | ICD-10-CM | POA: Insufficient documentation

## 2017-06-20 DIAGNOSIS — N401 Enlarged prostate with lower urinary tract symptoms: Secondary | ICD-10-CM | POA: Diagnosis not present

## 2017-06-20 DIAGNOSIS — E785 Hyperlipidemia, unspecified: Secondary | ICD-10-CM | POA: Diagnosis not present

## 2017-06-20 DIAGNOSIS — G4733 Obstructive sleep apnea (adult) (pediatric): Secondary | ICD-10-CM | POA: Diagnosis not present

## 2017-06-20 DIAGNOSIS — M109 Gout, unspecified: Secondary | ICD-10-CM | POA: Diagnosis not present

## 2017-06-20 DIAGNOSIS — I481 Persistent atrial fibrillation: Secondary | ICD-10-CM | POA: Diagnosis not present

## 2017-06-20 DIAGNOSIS — Z803 Family history of malignant neoplasm of breast: Secondary | ICD-10-CM | POA: Insufficient documentation

## 2017-06-20 DIAGNOSIS — Z8041 Family history of malignant neoplasm of ovary: Secondary | ICD-10-CM | POA: Diagnosis not present

## 2017-06-20 DIAGNOSIS — I110 Hypertensive heart disease with heart failure: Secondary | ICD-10-CM | POA: Diagnosis not present

## 2017-06-20 DIAGNOSIS — I5042 Chronic combined systolic (congestive) and diastolic (congestive) heart failure: Secondary | ICD-10-CM | POA: Insufficient documentation

## 2017-06-20 DIAGNOSIS — R3915 Urgency of urination: Secondary | ICD-10-CM | POA: Insufficient documentation

## 2017-06-20 DIAGNOSIS — Z7984 Long term (current) use of oral hypoglycemic drugs: Secondary | ICD-10-CM | POA: Insufficient documentation

## 2017-06-20 DIAGNOSIS — R972 Elevated prostate specific antigen [PSA]: Secondary | ICD-10-CM | POA: Diagnosis not present

## 2017-06-20 NOTE — Progress Notes (Signed)
Radiation Oncology         [phone] ________________________________  Initial outpatient Consultation  Name: Ronald [name] MRN: [id number]  Date: 06/20/2017  DOB: Oct 25, 1945  CW:[id number], Alinda Sierras, MD  McKenzie, Candee Furbish, MD   REFERRING PHYSICIAN: [name], MD  DIAGNOSIS: 72 y.o. gentleman with stage T1b adenocarcinoma of the prostate with a Gleason's score of 3+4 and a PSA of 12.80.     ICD-10-CM   1. Malignant neoplasm of prostate ([place]) C61     HISTORY OF PRESENT ILLNESS::Ronald [name] is a 72 y.o. gentleman with a long-standing history of elevated PSA, BOO sxs and original diagnosis of Gleason 3+3 prostate cancer in a single core in the left apex on 05/15/12.  He consulted with Dr. Tammi Klippel on 06/02/12, but opted for active surveillance at that time.  He has continued to be followed closely with Urology since that time and has had an additional 3 TRUSPBx since that time in 06/2013 with PSA 6.31 (benign/atypia only), 08/2015 with PSA 6.45 (3+3 in 2/12 cores in right mid and right mid lateral) and most recently had an MRI fusion biopsy by Dr. Tresa Moore in 01/2017 with PSA 10.6 (atypia only).  MRI detected a 12 mm left mid PIRADs 4 lesion with prostate volume of 68 cc.   In Jan. 2019 he was noted to have a further elevation in his PSA at 12.80 and continued to struggle with LUTS/BOO sxs and recurrent UTIs with urosepsis despite using Flomax daily and therefore elected to proceed with TURP for symptom management and prevention of further urosepsis. TURP was performed on 05/30/2017 and final pathology returned prostatic adenocarcinoma, GLEASON 3+4=7 (WHO Grade group 2) involving 25% of the tissue sample.   He had a CT of the abdomen and pelvis on 04/24/17 for disease staging which was withou any pathologically enlarged lymph nodes or evidence for metastatic disease.  The patient reviewed the pathology results with his urologist and he has kindly been referred today for discussion of  potential radiation treatment options. He is accompanied by his wife today.   PSA 04/25/2017                 12.80 12/25/2016                 10.60 12/13/2016                   10.90 02/14/2016                   6.45 11/11/2015                     5.35 02/15/2015                   6.49 08/19/2014                   6.14 01/25/2014                 7.24 05/15/2012                     6.63  PREVIOUS RADIATION THERAPY: No  PAST MEDICAL HISTORY:  has a past medical history of Allergic rhinitis, Anticoagulant long-term use, BPH (benign prostatic hyperplasia), COPD with emphysema ([place]), Dyspnea, History of colon polyps, History of gout, History of sepsis (02/18/2017), Hyperlipidemia, Hypertension, Nocturia, OSA on CPAP, Persistent atrial fibrillation ([place]), Prostate cancer ([place]) (urologsit-  dr Alyson Ingles--- as of 05-21-2017 per pt  last PSA 11 approx.), 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 ([place]), Seasonal allergies, Sigmoid diverticulosis, Systolic and diastolic CHF, chronic ([place]), Tinea versicolor, Type 2 diabetes mellitus ([place]), Urgency of urination, and Wears hearing aid in both ears.    PAST SURGICAL HISTORY: Past Surgical History:  Procedure Laterality Date  . CARDIOVERSION N/A 05/07/2014   Procedure: CARDIOVERSION;  Surgeon: [name], MD;  Location: [facility] ENDOSCOPY;  Service: Cardiovascular;  Laterality: N/A;  . CARDIOVERSION N/A 09/09/2014   Procedure: CARDIOVERSION;  Surgeon: Lelon Perla, MD;  Location: [facility] ENDOSCOPY;  Service: Cardiovascular;  Laterality: N/A;  successfully  . CATARACT EXTRACTION W/ INTRAOCULAR LENS  IMPLANT, BILATERAL  2018  . COLONOSCOPY  last one 06-07-2011  . HYDROCELE EXCISION Bilateral 09/26/2015   Procedure: HYDROCELECTOMY ADULT;  Surgeon: [name], MD;  Location[facility];  Service: Urology;  Laterality: Bilateral;  . LAMINECTOMY AND MICRODISCECTOMY LUMBAR SPINE  12-23-2003   Left L5 -- S1  . LAPAROSCOPIC BILATERAL  INGUINAL HERNIA REPAIR/  UMBILICAL HERNIA REPAIR WITH MESH/  ASPIRATION LEFT HYDROCELE  07-11-2012  . NM MYOVIEW LTD  05/18/2014   Low risk study. Normal perfusion: No ischemia or infarction. Mild LV dysfunction - 46% (does not correlate with echocardiographic EF of 50-55%)  . PROSTATE BIOPSY  05/15/12   Clinically both Lobes  . TEE WITHOUT CARDIOVERSION N/A 05/07/2014   Procedure: TRANSESOPHAGEAL ECHOCARDIOGRAM (TEE);  Surgeon: [name], MD;  Location: [facility] ENDOSCOPY;  Service: Cardiovascular;  Laterality: N/A;   mild atherosclerosis plaque of aorta,  mild AR, MR, and TR,  no cardiac source of emboli  . TONSILLECTOMY  as child  . TRANSTHORACIC ECHOCARDIOGRAM  11/'18; 1/'19   a) In setting of sepsis: EF of 40-45%.  Diffuse hypokinesis.  No RWMA.  Biatrial enlargement.;; b) f/u Jan 2019: Normal LVF 55-60%, mildly dilated aortic root(38 mm) and ascending aorta (44 mm). Compared to prior echo, LVEF has improved.  . TRANSURETHRAL RESECTION OF PROSTATE N/A 05/30/2017   Procedure: TRANSURETHRAL RESECTION OF THE PROSTATE (TURP);  Surgeon: [name], MD;  Location: [facility] ORS;  Service: Urology;  Laterality: N/A;    FAMILY HISTORY: family history includes Breast cancer in his mother; Ovarian cancer in his sister; Stroke in his unknown relative.  SOCIAL HISTORY:  reports that he has been smoking cigarettes.  He has a 60.00 pack-year smoking history. he has never used smokeless tobacco. He reports that he drinks about 8.4 oz of alcohol per week. He reports that he does not use drugs.  ALLERGIES: Patient has no known allergies.  MEDICATIONS:  Current Outpatient Medications  Medication Sig Dispense Refill  . amiodarone (PACERONE) 200 MG tablet Take 1 tablet (200 mg total) by mouth daily. (Patient taking differently: Take 200 mg by mouth every morning. ) 90 tablet 3  . apixaban (ELIQUIS) 5 MG TABS tablet Take 1 tablet (5 mg total) by mouth 2 (two) times daily. 60 tablet 0  . atorvastatin (LIPITOR) 10  MG tablet TAKE 1 TABLET (10 MG TOTAL) BY MOUTH EVERY EVENING. 90 tablet 2  . azelastine (ASTELIN) 0.1 % nasal spray Place 1 spray into both nostrils 2 (two) times daily. Use in each nostril as directed 30 mL 12  . carvedilol (COREG) 6.25 MG tablet TAKE 1 TABLET BY MOUTH TWICE A DAY WITH MEAL 180 tablet 1  . furosemide (LASIX) 40 MG tablet Take 1 tablet (40 mg total) 2 (two) times daily by mouth. Take 1 tablet in the morning and second tablet  at 2pm daily. 60 tablet 1  . glipiZIDE (GLUCOTROL) 5 MG tablet TAKE 1/2 TABLETS BY MOUTH TWICE DAILY (Patient taking differently: TAKE 1/2 TABLETS (2.5 MG) BY MOUTH TWICE DAILY) 60 tablet 0  . glucose blood (ACCU-CHEK AVIVA PLUS) test strip Test once daily dx e11.9 100 each 12  . KLOR-CON M20 20 MEQ tablet TAKE 2 TABLETS BY MOUTH DAILY (Patient taking differently: TAKE 1 TABLET BY MOUTH TWICE DAILY) 180 tablet 1  . Lancets (ACCU-CHEK SOFT TOUCH) lancets Check blood sugars once per day. DX: E11.9 100 each 2  . montelukast (SINGULAIR) 10 MG tablet Take 1 tablet (10 mg total) by mouth at bedtime. 90 tablet 1  . acetaminophen (TYLENOL) 500 MG tablet Take 500 mg by mouth every 6 (six) hours as needed for mild pain or moderate pain.    Marland Kitchen ALPRAZolam (XANAX) 0.25 MG tablet Take 1 tablet (0.25 mg total) at bedtime as needed by mouth for anxiety or sleep. (Patient not taking: Reported on 06/20/2017) 15 tablet 0  . colchicine 0.6 MG tablet Two tabs at onset of gout flare and then one tab twice daily as needed (Patient not taking: Reported on 06/20/2017) 30 tablet 2  . fluticasone (FLONASE) 50 MCG/ACT nasal spray Place 2 sprays into both nostrils daily as needed for allergies or rhinitis. (Patient not taking: Reported on 06/20/2017)    . hydrocortisone (ANUSOL-HC) 2.5 % rectal cream Apply 2 (two) times daily topically. (Patient not taking: Reported on 06/20/2017) 30 g 0  . losartan (COZAAR) 25 MG tablet Take 1 tablet (25 mg total) by mouth daily. 90 tablet 3  . Naphazoline HCl  (CLEAR EYES OP) Place 1-2 drops into both eyes 3 (three) times daily as needed (for dry/irritated eyes).    . nicotine (NICODERM CQ - DOSED IN MG/24 HOURS) 21 mg/24hr patch Place 1 patch (21 mg total) daily onto the skin. (Patient not taking: Reported on 06/20/2017) 28 patch 0  . oxyCODONE-acetaminophen (PERCOCET/ROXICET) 5-325 MG tablet Take 1 tablet by mouth every 4 (four) hours as needed for moderate pain. (Patient not taking: Reported on 06/20/2017) 30 tablet 0   No current facility-administered medications for this encounter.     REVIEW OF SYSTEMS:  On review of systems, the patient reports that he is doing well overall. He denies any chest pain, shortness of breath, cough, fevers, chills, night sweats, unintended weight changes. He denies any bowel or bladder disturbances, and denies abdominal pain, nausea or vomiting. He denies any new musculoskeletal or joint aches or pains, new skin lesions or concerns. He has a history of Afib but this has remained well controlled since the time of his ablation procedure and he remains on Amiodorone daily for rate control as well as Eliquis. The patient completed an IPSS and IIEF questionnaire.  His IPSS score was 17 indicating severe urinary outflow obstructive symptoms but admits that these are improving since the time of his TURP.  He feels he has improved force of stream and is emptying his bladder better on voiding.  He reports nocturia x1/night at this point which is a significant improvement. He denies gross hematuria, dysuria, or incontinence.  He indicated that he has ED and is most often not able to complete sexual activity. A complete review of systems is obtained and is otherwise negative.   PHYSICAL EXAM:    height is 5\' 11"  (1.803 m) and weight is 229 lb (103.9 kg). His blood pressure is 146/89 (abnormal) and his pulse is 66. His respiration is  18 and oxygen saturation is 100%.   In general this is a well appearing caucasian male in no acute distress.  He is alert and oriented x4 and appropriate throughout the examination. HEENT reveals that the patient is normocephalic, atraumatic. EOMs are intact. PERRLA. Skin is intact without any evidence of gross lesions. Cardiovascular exam reveals a regular rate and rhythm, no clicks rubs or murmurs are auscultated. Chest is clear to auscultation bilaterally. Lymphatic assessment is performed and does not reveal any adenopathy in the cervical, supraclavicular, axillary, or inguinal chains. Abdomen has active bowel sounds in all quadrants and is intact. The abdomen is soft, non tender, non distended. Lower extremities are negative for pretibial pitting edema, deep calf tenderness, cyanosis or clubbing.  KPS = 100  100 - Normal; no complaints; no evidence of disease. 90   - Able to carry on normal activity; minor signs or symptoms of disease. 80   - Normal activity with effort; some signs or symptoms of disease. 28   - Cares for self; unable to carry on normal activity or to do active work. 60   - Requires occasional assistance, but is able to care for most of his personal needs. 50   - Requires considerable assistance and frequent medical care. 23   - Disabled; requires special care and assistance. 6   - Severely disabled; hospital admission is indicated although death not imminent. 59   - Very sick; hospital admission necessary; active supportive treatment necessary. 10   - Moribund; fatal processes progressing rapidly. 0     - Dead  Karnofsky DA, Abelmann Bessie, Craver LS and Burchenal [facility] [phone]) The use of the nitrogen mustards in the palliative treatment of carcinoma: with particular reference to bronchogenic carcinoma Cancer 1 [phone]   LABORATORY DATA:  Lab Results  Component Value Date   WBC 8.0 05/31/2017   HGB 13.1 05/31/2017   HCT 39.2 05/31/2017   MCV 99.5 05/31/2017   PLT 173 05/31/2017   Lab Results  Component Value Date   NA 139 05/31/2017   K 3.9 05/31/2017   CL 104 05/31/2017    CO2 24 05/31/2017   Lab Results  Component Value Date   ALT 43 03/08/2017   AST 28 03/08/2017   ALKPHOS 84 03/08/2017   BILITOT 0.9 03/08/2017     RADIOGRAPHY: No results found.    IMPRESSION/ PLAN: Ronald [name] is a 72 y.o. gentleman with stage T1b adenocarcinoma of the prostate with a Gleason's score of 3+4 and a PSA of 12.80.    We discussed the patient's workup and outlined the nature of prostate cancer in this setting. The patient's T stage, Gleason's score, and PSA put him into the intermediate risk group. Accordingly, he is eligible for 5.5 - 8 weeks of external radiation or prostatectomy.  He is not a candidate for brachytherapy due to recent TURP procedure. We discussed the available radiation techniques, and focused on the details and logistics and delivery. We discussed and outlined the risks, benefits, short and long-term effects associated with radiotherapy and compared and contrasted these with prostatectomy. We discussed the role of SpaceOAR in reducing the rectal toxicity associated with radiotherapy. We also detailed the role of ADT in the treatment of prostate cancer and outlined the associated side effects that could be expected with this therapy. Although this is an option, this is not strongly recommended in his case.  He would like to avoid ADT unless it is felt to be absolutely necessary.  At the conclusion of our conversation, the patient is interested in moving forward with 5 1/2 weeks of external beam radiotherapy but would like to take some additional time to think things over and meet back with Dr. Noah Delaine as scheduled on 07/04/17 prior to making a commitment. Patient expressed that he will call our ofice with a final decision on treatment after follow up with [name].  Once he has reached his final decision, if appropriate, we will move forward with coordinating fiducial marker placement and SpaceOAR gel placement in the outpatient surgical setting prior to CT  simulation.  We spent 60 minutes face to face with the patient and more than 50% of that time was spent in counseling and/or coordination of care.  ------------------------------------------------   Nicholos Johns, PA-C    Tyler Pita, MD  [place]: [phone]  Fax: [phone] [email or web address]  Skype  LinkedIn    Page Me    This document serves as a record of services personally performed by Tyler Pita, MD and Ashlyn Bruning PA-C. It was created on their behalf by Delton Coombes, a trained medical scribe. The creation of this record is based on the scribe's personal observations and the provider's statements to them.

## 2017-06-20 NOTE — Progress Notes (Signed)
See progress note under physician encounter. 

## 2017-06-21 DIAGNOSIS — N400 Enlarged prostate without lower urinary tract symptoms: Secondary | ICD-10-CM | POA: Diagnosis not present

## 2017-06-24 ENCOUNTER — Other Ambulatory Visit: Payer: Self-pay | Admitting: Family Medicine

## 2017-07-04 DIAGNOSIS — N401 Enlarged prostate with lower urinary tract symptoms: Secondary | ICD-10-CM | POA: Diagnosis not present

## 2017-07-04 DIAGNOSIS — R351 Nocturia: Secondary | ICD-10-CM | POA: Diagnosis not present

## 2017-07-08 ENCOUNTER — Encounter: Payer: Self-pay | Admitting: Family Medicine

## 2017-07-08 ENCOUNTER — Ambulatory Visit (INDEPENDENT_AMBULATORY_CARE_PROVIDER_SITE_OTHER): Payer: Federal, State, Local not specified - PPO | Admitting: Family Medicine

## 2017-07-08 VITALS — BP 130/90 | HR 76 | Temp 98.2°F | Wt 235.6 lb

## 2017-07-08 DIAGNOSIS — L570 Actinic keratosis: Secondary | ICD-10-CM

## 2017-07-08 NOTE — Patient Instructions (Signed)
Actinic Keratosis An actinic keratosis is a precancerous growth on the skin. This means that it could develop into skin cancer if it is not treated. About 1% of these growths (actinic keratoses) turn into skin cancer within one year if they are not treated. It is important to have all of these growths evaluated to determine the best treatment approach. What are the causes? This condition is caused by getting too much ultraviolet (UV) radiation from the sun or other UV light sources. What increases the risk? The following factors may make you more likely to develop this condition:  Having light-colored skin and blue eyes.  Having blonde or red hair.  Spending a lot of time in the sun.  Inadequate skin protection when outdoors. This may include: ? Not using sunscreen properly. ? Not covering up skin that is exposed to sunlight.  Aging. The risk of developing an actinic keratosis increases with age.  What are the signs or symptoms? Actinic keratoses look like scaly, rough spots of skin.They can be as small as a pinhead or as big as a quarter. They may itch, hurt, or feel sensitive. In most cases, the growths become red. In some cases, they may be skin-colored, light tan, dark tan, pink, or a combination of any of these colors. There may be a small piece of pink or gray skin (skin tag) growing from the actinic keratosis. In some cases, it may be easier to notice actinic keratoses by feeling them, rather than seeing them. Actinic keratoses appear most often on areas of skin that get a lot of sun exposure, including the scalp, face, ears, lips, upper back, forearms, and the backs of the hands. Sometimes, actinic keratoses disappear, but many reappear a few days to a few weeks later. How is this diagnosed? This condition is usually diagnosed with a physical exam. A tissue sample may be removed from the actinic keratosis and examined under a microscope (biopsy). How is this treated?  Treatment for  this condition may include:  Scraping off the actinic keratosis (curettage).  Freezing the actinic keratosis with liquid nitrogen (cryosurgery). This causes the growth to eventually fall off the skin.  Applying medicated creams or gels to destroy the cells in the growth.  Applying chemicals to the actinic keratosis to make the outer layers of skin peel off (chemical peel).  Photodynamic therapy. In this procedure, medicated cream is applied to the actinic keratosis. This cream increases your skin's sensitivity to light. Then, a strong light is aimed at the actinic keratosis to destroy cells in the growth.  Follow these instructions at home: Skin care  Apply cool, wet cloths (cool compresses) to the affected areas.  Do not scratch your skin.  Check your skin regularly for any growths, especially growths that: ? Start to itch or bleed. ? Change in size, shape, or color. Caring for the treated area  Keep the treated area clean and dry as told by your health care provider.  Do not apply any medicine, cream, or lotion to the treated area unless your health care provider tells you to do that.  Do not pick at blisters or try to break them open. This can cause infection and scarring.  If you have red or irritated skin after treatment, follow instructions from your health care provider about how to take care of the treated area. Make sure you: ? Wash your hands with soap and water before you change your bandage (dressing). If soap and water are not available, use  hand sanitizer. ? Change your dressing as told by your health care provider.  If you have red or irritated skin after treatment, check your treated area every day for signs of infection. Check for: ? Swelling, pain, or more redness. ? Fluid or blood. ? Warmth. ? Pus or a bad smell. General instructions  Take over-the-counter and prescription medicines only as told by your health care provider.  Return to your normal  activities as told by your health care provider. Ask your health care provider what activities are safe for you.  Do not use any tobacco products, such as cigarettes, chewing tobacco, and e-cigarettes. If you need help quitting, ask your health care provider.  Have a skin exam done every year by a health care provider who is a skin conditions specialist (dermatologist).  Keep all follow-up visits as told by your health care provider. This is important. How is this prevented?  Do not get sunburns.  Try to avoid the sun between 10:00 a.m. and 4:00 p.m. This is when the UV light is the strongest.  Use a sunscreen or sunblock with SPF 30 (sun protection factor 30) or greater.  Apply sunscreen before you are exposed to sunlight, and reapply periodically as often as directed by the instructions on the sunscreen container.  Always wear sunglasses that have UV protection, and always wear hats and clothing to protect your skin from sunlight.  When possible, avoid medicines that increase your sensitivity to sunlight. These include: ? Certain antibiotic medicines. ? Certain water pills (diuretics). ? Certain prescription medicines that are used to treat acne (retinoids).  Do not use tanning beds or other indoor tanning devices. Contact a health care provider if:  You notice any changes or new growths on your skin.  You have swelling, pain, or more redness around your treated area.  You have fluid or blood coming from your treated area.  Your treated area feels warm to the touch.  You have pus or a bad smell coming from your treated area.  You have a fever.  You have a blister that becomes large and painful. This information is not intended to replace advice given to you by your health care provider. Make sure you discuss any questions you have with your health care provider. Document Released: 06/22/2008 Document Revised: 11/25/2015 Document Reviewed: 12/04/2014 Elsevier Interactive  Patient Education  2018 [employer]  

## 2017-07-08 NOTE — Progress Notes (Signed)
Subjective:     Patient ID: Ronald [name], male   DOB: 01-15-1946, 72 y.o.   MRN: [id number]  HPI  Patient here today for procedure only visit. Refer to recent note. He had multiple actinic keratoses on the hands and right forearm. These were treated with liquid nitrogen. He has seen great improvement terms of size. We discussed possible biopsy of right forearm lesion but this is greatly diminished in size. He is requesting repeat treatment at this time.  No history of squamous cell cancer. High risk.  Past Medical History:  Diagnosis Date  . Allergic rhinitis   . Anticoagulant long-term use    eliquis  . BPH (benign prostatic hyperplasia)   . COPD with emphysema [facility])    pulmologist-  dr Halford Chessman  . Dyspnea    occasional per pt  . History of colon polyps    tubular adenoma 2013  . History of gout    last episode early June 2017-- (feet)  . History of sepsis 02/18/2017   per d/c note probable uti, acute chf, acute renal failure, hypoxia  . Hyperlipidemia   . Hypertension   . Nocturia    flomax has improved symptoms  . OSA on CPAP    per study 08-03-2004  Severe OSA  . Persistent atrial fibrillation [facility])    cardiologist --  dr Dorris Carnes--  post cardioversion 05-07-2014  . Prostate cancer [facility]) urologsit-  dr Alyson Ingles--- as of 05-21-2017 per pt last PSA 11 approx.   Dx  2014--  stage T1c, Gleason 3+3=6, PSA 6.67--  Active surveillance  . 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 ([place])    pulmologist-  dr Halford Chessman  . Seasonal allergies   . Sigmoid diverticulosis   . Systolic and diastolic CHF, chronic ([place])   . Tinea versicolor   . Type 2 diabetes mellitus ([place])   . Urgency of urination    improved on flomax  . Wears hearing aid in both ears    Past Surgical History:  Procedure Laterality Date  . CARDIOVERSION N/A 05/07/2014   Procedure: CARDIOVERSION;  Surgeon: [name], MD;  Location: [facility] ENDOSCOPY;  Service: Cardiovascular;  Laterality: N/A;  .  CARDIOVERSION N/A 09/09/2014   Procedure: CARDIOVERSION;  Surgeon: Lelon Perla, MD;  Location: [facility] ENDOSCOPY;  Service: Cardiovascular;  Laterality: N/A;  successfully  . CATARACT EXTRACTION W/ INTRAOCULAR LENS  IMPLANT, BILATERAL  2018  . COLONOSCOPY  last one 06-07-2011  . HYDROCELE EXCISION Bilateral 09/26/2015   Procedure: HYDROCELECTOMY ADULT;  Surgeon: [name], MD;  Location: [facility];  Service: Urology;  Laterality: Bilateral;  . LAMINECTOMY AND MICRODISCECTOMY LUMBAR SPINE  12-23-2003   Left L5 -- S1  . LAPAROSCOPIC BILATERAL INGUINAL HERNIA REPAIR/  UMBILICAL HERNIA REPAIR WITH MESH/  ASPIRATION LEFT HYDROCELE  07-11-2012  . NM MYOVIEW LTD  05/18/2014   Low risk study. Normal perfusion: No ischemia or infarction. Mild LV dysfunction - 46% (does not correlate with echocardiographic EF of 50-55%)  . PROSTATE BIOPSY  05/15/12   Clinically both Lobes  . TEE WITHOUT CARDIOVERSION N/A 05/07/2014   Procedure: TRANSESOPHAGEAL ECHOCARDIOGRAM (TEE);  Surgeon: [name], MD;  Location: [facility] ENDOSCOPY;  Service: Cardiovascular;  Laterality: N/A;   mild atherosclerosis plaque of aorta,  mild AR, MR, and TR,  no cardiac source of emboli  . TONSILLECTOMY  as child  . TRANSTHORACIC ECHOCARDIOGRAM  11/'18; 1/'19   a) In setting of sepsis: EF of 40-45%.  Diffuse hypokinesis.  No RWMA.  Biatrial enlargement.;; b) f/u Jan 2019: Normal LVF 55-60%, mildly dilated aortic root(38 mm) and ascending aorta (44 mm). Compared to prior echo, LVEF has improved.  . TRANSURETHRAL RESECTION OF PROSTATE N/A 05/30/2017   Procedure: TRANSURETHRAL RESECTION OF THE PROSTATE (TURP);  Surgeon: [name], MD;  Location: [facility];  Service: Urology;  Laterality: N/A;    reports that he has been smoking cigarettes.  He has a 60.00 pack-year smoking history. He has never used smokeless tobacco. He reports that he drinks about 8.4 oz of alcohol per week. He reports that he does not use  drugs. family history includes Breast cancer in his mother; Ovarian cancer in his sister; Stroke in his unknown relative. No Known Allergies  Review of Systems  Constitutional: Negative for appetite change and unexpected weight change.       Objective:   Physical Exam  Constitutional: He appears well-developed and well-nourished.  Cardiovascular: Normal rate.  Skin:  Multiple hyperkeratotic skin lesions involving right forearm and dorsum of both hands. None of these appear to be ulcerative       Assessment:     Multiple actinic keratoses as above    Plan:     -We discussed risk and benefits of liquid nitrogen therapy including risk of blistering, infection and patient consented -Total of 4 lesions were treated right forearm we treated 8 right hand and 6 left hand. Patient tolerated well -Plan routine follow-up in 4 months and plan repeat A1c and basic metabolic panel then  [name] MD [facility]

## 2017-07-10 ENCOUNTER — Telehealth: Payer: Self-pay | Admitting: Urology

## 2017-07-10 NOTE — Telephone Encounter (Signed)
I spoke with the patient by phone to ascertain whether he had made his final treatment decision.  He states that he is still undecided at this time but expects to be able to make his final decision within the next 2 weeks.  He requests that I call him back the week of July 22, 2017.  I will plan to touch base with him on Tuesday, July 23, 2017 and will proceed with scheduling accordingly at that time based on his final treatment preference.   Nicholos Johns, PA-C

## 2017-07-15 ENCOUNTER — Other Ambulatory Visit: Payer: Self-pay | Admitting: Family Medicine

## 2017-07-17 ENCOUNTER — Ambulatory Visit: Payer: Federal, State, Local not specified - PPO | Admitting: Radiation Oncology

## 2017-07-17 ENCOUNTER — Ambulatory Visit: Payer: Federal, State, Local not specified - PPO

## 2017-07-17 DIAGNOSIS — F17210 Nicotine dependence, cigarettes, uncomplicated: Secondary | ICD-10-CM | POA: Diagnosis not present

## 2017-07-17 DIAGNOSIS — C61 Malignant neoplasm of prostate: Secondary | ICD-10-CM | POA: Diagnosis not present

## 2017-07-17 DIAGNOSIS — R937 Abnormal findings on diagnostic imaging of other parts of musculoskeletal system: Secondary | ICD-10-CM | POA: Diagnosis not present

## 2017-07-17 DIAGNOSIS — Z800 Family history of malignant neoplasm of digestive organs: Secondary | ICD-10-CM | POA: Diagnosis not present

## 2017-07-17 DIAGNOSIS — Z01818 Encounter for other preprocedural examination: Secondary | ICD-10-CM | POA: Diagnosis not present

## 2017-07-17 DIAGNOSIS — Z7901 Long term (current) use of anticoagulants: Secondary | ICD-10-CM | POA: Diagnosis not present

## 2017-07-17 DIAGNOSIS — Z803 Family history of malignant neoplasm of breast: Secondary | ICD-10-CM | POA: Diagnosis not present

## 2017-07-17 DIAGNOSIS — Z7984 Long term (current) use of oral hypoglycemic drugs: Secondary | ICD-10-CM | POA: Diagnosis not present

## 2017-07-17 DIAGNOSIS — Z9079 Acquired absence of other genital organ(s): Secondary | ICD-10-CM | POA: Diagnosis not present

## 2017-07-17 DIAGNOSIS — Z79899 Other long term (current) drug therapy: Secondary | ICD-10-CM | POA: Diagnosis not present

## 2017-07-19 DIAGNOSIS — N4289 Other specified disorders of prostate: Secondary | ICD-10-CM | POA: Diagnosis not present

## 2017-07-19 DIAGNOSIS — Z8546 Personal history of malignant neoplasm of prostate: Secondary | ICD-10-CM | POA: Diagnosis not present

## 2017-07-23 DIAGNOSIS — N401 Enlarged prostate with lower urinary tract symptoms: Secondary | ICD-10-CM | POA: Diagnosis not present

## 2017-07-23 DIAGNOSIS — R3915 Urgency of urination: Secondary | ICD-10-CM | POA: Diagnosis not present

## 2017-07-25 ENCOUNTER — Emergency Department (HOSPITAL_BASED_OUTPATIENT_CLINIC_OR_DEPARTMENT_OTHER)
Admission: EM | Admit: 2017-07-25 | Discharge: 2017-07-25 | Disposition: A | Discharge status: Home / Self Care | Payer: Federal, State, Local not specified - PPO | Attending: Emergency Medicine | Admitting: Emergency Medicine

## 2017-07-25 ENCOUNTER — Encounter (HOSPITAL_BASED_OUTPATIENT_CLINIC_OR_DEPARTMENT_OTHER): Payer: Self-pay | Admitting: *Deleted

## 2017-07-25 ENCOUNTER — Emergency Department (HOSPITAL_BASED_OUTPATIENT_CLINIC_OR_DEPARTMENT_OTHER): Payer: Federal, State, Local not specified - PPO

## 2017-07-25 ENCOUNTER — Other Ambulatory Visit: Payer: Self-pay

## 2017-07-25 DIAGNOSIS — F17210 Nicotine dependence, cigarettes, uncomplicated: Secondary | ICD-10-CM | POA: Diagnosis not present

## 2017-07-25 DIAGNOSIS — E119 Type 2 diabetes mellitus without complications: Secondary | ICD-10-CM | POA: Diagnosis not present

## 2017-07-25 DIAGNOSIS — I4891 Unspecified atrial fibrillation: Secondary | ICD-10-CM | POA: Diagnosis not present

## 2017-07-25 DIAGNOSIS — I5042 Chronic combined systolic (congestive) and diastolic (congestive) heart failure: Secondary | ICD-10-CM | POA: Insufficient documentation

## 2017-07-25 DIAGNOSIS — Z7984 Long term (current) use of oral hypoglycemic drugs: Secondary | ICD-10-CM | POA: Insufficient documentation

## 2017-07-25 DIAGNOSIS — Z79899 Other long term (current) drug therapy: Secondary | ICD-10-CM | POA: Diagnosis not present

## 2017-07-25 DIAGNOSIS — Z8546 Personal history of malignant neoplasm of prostate: Secondary | ICD-10-CM | POA: Diagnosis not present

## 2017-07-25 DIAGNOSIS — J449 Chronic obstructive pulmonary disease, unspecified: Secondary | ICD-10-CM | POA: Insufficient documentation

## 2017-07-25 DIAGNOSIS — I4819 Other persistent atrial fibrillation: Secondary | ICD-10-CM

## 2017-07-25 DIAGNOSIS — I110 Hypertensive heart disease with heart failure: Secondary | ICD-10-CM | POA: Diagnosis not present

## 2017-07-25 DIAGNOSIS — R531 Weakness: Secondary | ICD-10-CM | POA: Diagnosis not present

## 2017-07-25 DIAGNOSIS — Z7901 Long term (current) use of anticoagulants: Secondary | ICD-10-CM | POA: Insufficient documentation

## 2017-07-25 DIAGNOSIS — I499 Cardiac arrhythmia, unspecified: Secondary | ICD-10-CM | POA: Diagnosis not present

## 2017-07-25 DIAGNOSIS — R0989 Other specified symptoms and signs involving the circulatory and respiratory systems: Secondary | ICD-10-CM | POA: Diagnosis not present

## 2017-07-25 DIAGNOSIS — I481 Persistent atrial fibrillation: Secondary | ICD-10-CM | POA: Insufficient documentation

## 2017-07-25 LAB — BASIC METABOLIC PANEL WITH GFR
Anion gap: 14 (ref 5–15)
BUN: 20 mg/dL (ref 6–20)
CO2: 22 mmol/L (ref 22–32)
Calcium: 8.8 mg/dL — ABNORMAL LOW (ref 8.9–10.3)
Chloride: 101 mmol/L (ref 101–111)
Creatinine, Ser: 1.47 mg/dL — ABNORMAL HIGH (ref 0.61–1.24)
GFR calc Af Amer: 53 mL/min — ABNORMAL LOW
GFR calc non Af Amer: 46 mL/min — ABNORMAL LOW
Glucose, Bld: 181 mg/dL — ABNORMAL HIGH (ref 65–99)
Potassium: 3.4 mmol/L — ABNORMAL LOW (ref 3.5–5.1)
Sodium: 137 mmol/L (ref 135–145)

## 2017-07-25 LAB — CBC
HCT: 46.5 % (ref 39.0–52.0)
Hemoglobin: 16.4 g/dL (ref 13.0–17.0)
MCH: 34.5 pg — ABNORMAL HIGH (ref 26.0–34.0)
MCHC: 35.3 g/dL (ref 30.0–36.0)
MCV: 97.7 fL (ref 78.0–100.0)
Platelets: 202 10*3/uL (ref 150–400)
RBC: 4.76 MIL/uL (ref 4.22–5.81)
RDW: 12.9 % (ref 11.5–15.5)
WBC: 6.7 10*3/uL (ref 4.0–10.5)

## 2017-07-25 LAB — MAGNESIUM: Magnesium: 2.1 mg/dL (ref 1.7–2.4)

## 2017-07-25 MED ORDER — METOPROLOL TARTRATE 5 MG/5ML IV SOLN
5.0000 mg | INTRAVENOUS | Status: DC | PRN
  Administered 2017-07-25 (×2): 5 mg via INTRAVENOUS
  Filled 2017-07-25 (×2): qty 5

## 2017-07-25 NOTE — ED Notes (Signed)
Second dose of Lopressor given per Dr. Jeneen Rinks order, pt's SPO2 low 90 on RA, pt placed on 2L [place] up to 94%, pt denies any SOB, pain or discomfort. Pt resting on bed NAD noticed.

## 2017-07-25 NOTE — Discharge Instructions (Addendum)
Increase your Coreg/carvedilol 12.5 mg  (2 of the 6.25mg  tabs) morning and evening  Call [name] for appointment. Check and record your pulse and blood pressure daily. Decrease your dose and call your cardiologist with heart rate less than 70, or blood pressure less than 100.

## 2017-07-25 NOTE — ED Triage Notes (Signed)
Hx of controlled atrial fib. Today his heart beat has felt irregular all day.

## 2017-07-25 NOTE — ED Notes (Signed)
Patient transported to X-ray 

## 2017-07-25 NOTE — ED Provider Notes (Signed)
[facility] EMERGENCY DEPARTMENT Provider Note   CSN: [id number] Arrival date & time: 07/25/17  2044     History   Chief Complaint Chief Complaint  Patient presents with  . Irregular Heart Beat    HPI CORNELLIUS KROPP is a 72 y.o. male.  Chief complaint is "my Fitbit said my heart was fast".  HPI: 72 year old male.  History of A. fib.  Follows with [name].  At one point was thought to be permanent A. fib.  Failed 2 attempts at cardioversion.  However with amiodarone treatment was found to be in sinus rhythm and his last cardiology office visit in January.  He states intermittently he will notice that his fit bit device tells him that his heart rate is fast.  He is not subjectively aware of that.  Tonight he states that he had worked in the yard all day yesterday.  Did have a lot of energy today and did not feel well.  No chest pain, shortness of breath, or palpitations.  He checked his heart rate tonight and it was 120.  He has some sort of a device that his wife describes as "he puts all of his fingers on it and it gives him an EKG".  Currently, this device told him his pulse was irregular and suggested atrial fibrillation.  He was concerned.  He presents here.  Past Medical History:  Diagnosis Date  . Allergic rhinitis   . Anticoagulant long-term use    eliquis  . BPH (benign prostatic hyperplasia)   . COPD with emphysema [facility])    pulmologist-  dr Halford Chessman  . Dyspnea    occasional per pt  . History of colon polyps    tubular adenoma 2013  . History of gout    last episode early June 2017-- (feet)  . History of sepsis 02/18/2017   per d/c note probable uti, acute chf, acute renal failure, hypoxia  . Hyperlipidemia   . Hypertension   . Nocturia    flomax has improved symptoms  . OSA on CPAP    per study 08-03-2004  Severe OSA  . Persistent atrial fibrillation [facility])    cardiologist --  dr Dorris Carnes--  post cardioversion 05-07-2014  . Prostate cancer [facility])  urologsit-  dr Alyson Ingles--- as of 05-21-2017 per pt last PSA 11 approx.   Dx  2014--  stage T1c, Gleason 3+3=6, PSA 6.67--  Active surveillance  . 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 Seasonal allergies   . Sigmoid diverticulosis   . Systolic and diastolic CHF, chronic ([place])   . Tinea versicolor   . Type 2 diabetes mellitus ([place])   . Urgency of urination    improved on flomax  . Wears hearing aid in both ears     Patient Active Problem List   Diagnosis Date Noted  . Actinic keratoses 06/07/2017  . BPH (benign prostatic hyperplasia) 05/30/2017  . Cardiomyopathy due to systemic disease ([place]) 03/09/2017  . Preop cardiovascular exam 03/07/2017  . Hypervolemia   . Acute on chronic combined systolic and diastolic CHF (congestive heart failure) ([place])   . Hypoxia 02/21/2017  . ARF (acute renal failure) ([place])   . Hypomagnesemia 02/20/2017  . GI bleed 02/20/2017  . Severe sepsis ([place]) 02/19/2017  . History of colonic polyps 07/03/2016  . Chronic anticoagulation 07/03/2016  . Hyperglycemia, drug-induced 04/05/2015  . 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 On amiodarone  therapy 12/08/2014  . Edema of both legs 12/08/2014  . Exertional dyspnea 08/18/2014  . Hypokalemia   . Tobacco abuse   . Atrial fibrillation, persistent ([place]):  CHA2DS2-VASc Score 3 - On Eliquis 05/04/2014  . Cigarette smoker two packs a day or less   . Prostate cancer ([place]) 10/15/2013  . Obesity (BMI 30-39.9) 04/17/2013  . Umbilical hernia 97/98/9211  . Right inguinal hernia 06/23/2012  . Hydrocele 06/19/2012  . Stage T1c Adenocarcinoma of the Prostate with a Gleason's Score of 3+3 and a PSA of 6.63 - Favorable Risk 06/02/2012  . Metabolic syndrome 94/17/4081  . Type 2 diabetes mellitus ([place]) 03/20/2012  . Elevated PSA 03/20/2012  . GOUT, UNSPECIFIED 10/07/2009  . TINEA VERSICOLOR 07/19/2009  . PERS HX TOBACCO  USE PRESENTING HAZARDS HEALTH 07/19/2009  . Obstructive sleep apnea 07/02/2008  . Hyperlipidemia with target LDL less than 70 07/01/2008  . Essential hypertension 07/01/2008  . ALLERGIC RHINITIS 07/01/2008    Past Surgical History:  Procedure Laterality Date  . CARDIOVERSION N/A 05/07/2014   Procedure: CARDIOVERSION;  Surgeon: [name], MD;  Location: [facility] ENDOSCOPY;  Service: Cardiovascular;  Laterality: N/A;  . CARDIOVERSION N/A 09/09/2014   Procedure: CARDIOVERSION;  Surgeon: Lelon Perla, MD;  Location: [facility] ENDOSCOPY;  Service: Cardiovascular;  Laterality: N/A;  successfully  . CATARACT EXTRACTION W/ INTRAOCULAR LENS  IMPLANT, BILATERAL  2018  . COLONOSCOPY  last one 06-07-2011  . HYDROCELE EXCISION Bilateral 09/26/2015   Procedure: HYDROCELECTOMY ADULT;  Surgeon: [name], MD;  Location: [facility];  Service: Urology;  Laterality: Bilateral;  . LAMINECTOMY AND MICRODISCECTOMY LUMBAR SPINE  12-23-2003   Left L5 -- S1  . LAPAROSCOPIC BILATERAL INGUINAL HERNIA REPAIR/  UMBILICAL HERNIA REPAIR WITH MESH/  ASPIRATION LEFT HYDROCELE  07-11-2012  . NM MYOVIEW LTD  05/18/2014   Low risk study. Normal perfusion: No ischemia or infarction. Mild LV dysfunction - 46% (does not correlate with echocardiographic EF of 50-55%)  . PROSTATE BIOPSY  05/15/12   Clinically both Lobes  . TEE WITHOUT CARDIOVERSION N/A 05/07/2014   Procedure: TRANSESOPHAGEAL ECHOCARDIOGRAM (TEE);  Surgeon: [name], MD;  Location: [facility] ENDOSCOPY;  Service: Cardiovascular;  Laterality: N/A;   mild atherosclerosis plaque of aorta,  mild AR, MR, and TR,  no cardiac source of emboli  . TONSILLECTOMY  as child  . TRANSTHORACIC ECHOCARDIOGRAM  11/'18; 1/'19   a) In setting of sepsis: EF of 40-45%.  Diffuse hypokinesis.  No RWMA.  Biatrial enlargement.;; b) f/u Jan 2019: Normal LVF 55-60%, mildly dilated aortic root(38 mm) and ascending aorta (44 mm). Compared to prior echo, LVEF has improved.  .  TRANSURETHRAL RESECTION OF PROSTATE N/A 05/30/2017   Procedure: TRANSURETHRAL RESECTION OF THE PROSTATE (TURP);  Surgeon: [name], MD;  Location: [facility];  Service: Urology;  Laterality: N/A;        Home Medications    Prior to Admission medications   Medication Sig Start Date End Date Taking? Authorizing Provider  acetaminophen (TYLENOL) 500 MG tablet Take 500 mg by mouth every 6 (six) hours as needed for mild pain or moderate pain.    [provider]  ALPRAZolam Duanne Moron) 0.25 MG tablet Take 1 tablet (0.25 mg total) at bedtime as needed by mouth for anxiety or sleep. 02/25/17   Eugenie Filler, MD  amiodarone (PACERONE) 200 MG tablet Take 1 tablet (200 mg total) by mouth daily. Patient taking differently: Take 200 mg by mouth every morning.  03/27/17  Leonie Man, MD  apixaban (ELIQUIS) 5 MG TABS tablet Take 1 tablet (5 mg total) by mouth 2 (two) times daily. 06/06/17   McKenzie, Candee Furbish, MD  atorvastatin (LIPITOR) 10 MG tablet TAKE 1 TABLET (10 MG TOTAL) BY MOUTH EVERY EVENING. 01/21/17   Burchette, Alinda Sierras, MD  azelastine (ASTELIN) 0.1 % nasal spray Place 1 spray into both nostrils 2 (two) times daily. Use in each nostril as directed 12/22/15   Chesley Mires, MD  carvedilol (COREG) 6.25 MG tablet TAKE 1 TABLET BY MOUTH TWICE A DAY WITH MEAL 04/26/17   Burchette, Alinda Sierras, MD  colchicine 0.6 MG tablet Two tabs at onset of gout flare and then one tab twice daily as needed 01/30/17   [name], MD  fluticasone (FLONASE) 50 MCG/ACT nasal spray Place 2 sprays into both nostrils daily as needed for allergies or rhinitis. 05/08/14   Hongalgi, Lenis Dickinson, MD  furosemide (LASIX) 40 MG tablet Take 1 tablet (40 mg total) 2 (two) times daily by mouth. Take 1 tablet in the morning and second tablet at 2pm daily. 02/25/17   Eugenie Filler, MD  glipiZIDE (GLUCOTROL) 5 MG tablet TAKE 1/2 TABLETS BY MOUTH TWICE DAILY 06/24/17   Burchette, Alinda Sierras, MD  glucose blood (ACCU-CHEK  AVIVA PLUS) test strip Test once daily dx e11.9 03/13/17   Burchette, Alinda Sierras, MD  hydrocortisone (ANUSOL-HC) 2.5 % rectal cream Apply 2 (two) times daily topically. 02/25/17   Eugenie Filler, MD  KLOR-CON M20 20 MEQ tablet TAKE 2 TABLETS BY MOUTH DAILY Patient taking differently: TAKE 1 TABLET BY MOUTH TWICE DAILY 04/26/17   Burchette, Alinda Sierras, MD  Lancets (ACCU-CHEK SOFT TOUCH) lancets Check blood sugars once per day. DX: E11.9 10/24/15   Burchette, Alinda Sierras, MD  losartan (COZAAR) 25 MG tablet Take 1 tablet (25 mg total) by mouth daily. 03/07/17 06/05/17  Leonie Man, MD  montelukast (SINGULAIR) 10 MG tablet TAKE 1 TABLET BY MOUTH EVERYDAY AT BEDTIME 07/17/17   Burchette, Alinda Sierras, MD  Naphazoline HCl (CLEAR EYES OP) Place 1-2 drops into both eyes 3 (three) times daily as needed (for dry/irritated eyes).    [provider]  nicotine (NICODERM CQ - DOSED IN MG/24 HOURS) 21 mg/24hr patch Place 1 patch (21 mg total) daily onto the skin. 02/26/17   Eugenie Filler, MD  oxyCODONE-acetaminophen (PERCOCET/ROXICET) 5-325 MG tablet Take 1 tablet by mouth every 4 (four) hours as needed for moderate pain. 05/31/17   McKenzie, Candee Furbish, MD    Family History Family History  Problem Relation Age of Onset  . Breast cancer Mother   . Stroke Unknown        Unknown   . Ovarian cancer Sister   . Colon cancer Neg Hx   . Esophageal cancer Neg Hx   . Rectal cancer Neg Hx   . Stomach cancer Neg Hx     Social History Social History   Tobacco Use  . Smoking status: Current Every Day Smoker    Packs/day: 1.00    Years: 60.00    Pack years: 60.00    Types: Cigarettes  . Smokeless tobacco: Never Used  Substance Use Topics  . Alcohol use: Yes    Alcohol/week: 8.4 oz    Types: 14 Standard drinks or equivalent per week    Comment: 2 drinks daily  . Drug use: No     Allergies   Patient has no known allergies.   Review of Systems  Review of Systems  Constitutional: Negative for  appetite change, chills, diaphoresis, fatigue and fever.  HENT: Negative for mouth sores, sore throat and trouble swallowing.   Eyes: Negative for visual disturbance.  Respiratory: Negative for cough, chest tightness, shortness of breath and wheezing.   Cardiovascular: Negative for chest pain.  Gastrointestinal: Negative for abdominal distention, abdominal pain, diarrhea, nausea and vomiting.  Endocrine: Negative for polydipsia, polyphagia and polyuria.  Genitourinary: Negative for dysuria, frequency and hematuria.  Musculoskeletal: Negative for gait problem.  Skin: Negative for color change, pallor and rash.  Neurological: Positive for weakness. Negative for dizziness, syncope, light-headedness and headaches.  Hematological: Does not bruise/bleed easily.  Psychiatric/Behavioral: Negative for behavioral problems and confusion.     Physical Exam Updated Vital Signs BP 125/82   Pulse 81   Temp 98 F (36.7 C) (Oral)   Resp 18   Ht 5\' 11"  (1.803 m)   Wt 104.3 kg (230 lb)   SpO2 95%   BMI 32.08 kg/m   Physical Exam  Constitutional: He is oriented to person, place, and time. He appears well-developed and well-nourished. No distress.  HENT:  Head: Normocephalic.  Eyes: Pupils are equal, round, and reactive to light. Conjunctivae are normal. No scleral icterus.  Neck: Normal range of motion. Neck supple. No thyromegaly present.  Cardiovascular: Normal rate and regular rhythm. Exam reveals no gallop and no friction rub.  No murmur heard. Irregularly irregular and rapid.  A. fib with RVR rate 117 on the monitor.  Pulmonary/Chest: Effort normal and breath sounds normal. No respiratory distress. He has no wheezes. He has no rales.  Clear lungs.  No crackles or rales.  No JVD.  No S4 or S3.  No dependent edema.  Not hypoxemic.  Abdominal: Soft. Bowel sounds are normal. He exhibits no distension. There is no tenderness. There is no rebound.  Musculoskeletal: Normal range of motion.    Neurological: He is alert and oriented to person, place, and time.  Skin: Skin is warm and dry. No rash noted.  Psychiatric: He has a normal mood and affect. His behavior is normal.     ED Treatments / Results  Labs (all labs ordered are listed, but only abnormal results are displayed) Labs Reviewed  BASIC METABOLIC PANEL - Abnormal; Notable for the following components:      Result Value   Potassium 3.4 (*)    Glucose, Bld 181 (*)    Creatinine, Ser 1.47 (*)    Calcium 8.8 (*)    GFR calc non Af Amer 46 (*)    GFR calc Af Amer 53 (*)    All other components within normal limits  CBC - Abnormal; Notable for the following components:   MCH 34.5 (*)    All other components within normal limits  MAGNESIUM    EKG EKG Interpretation  Date/Time:  Thursday July 25 2017 22:33:28 EDT Ventricular Rate:  84 PR Interval:    QRS Duration: 92 QT Interval:  383 QTC Calculation: 453 R Axis:   63 Text Interpretation:  Sinus rhythm Consider left atrial enlargement Low voltage with right axis deviation Confirmed by Tanna Furry [phone]) on 07/25/2017 10:59:08 PM   Radiology Dg Chest 2 View  Result Date: 07/25/2017 CLINICAL DATA:  Acute onset of irregular heartbeat. EXAM: CHEST - 2 VIEW COMPARISON:  Chest radiograph performed 02/24/2017 FINDINGS: The lungs are well-aerated. Mild vascular congestion is again noted. There is no evidence of focal opacification, pleural effusion or pneumothorax. The heart is normal in  size; the mediastinal contour is within normal limits. No acute osseous abnormalities are seen. IMPRESSION: Mild vascular congestion noted; lungs remain grossly clear. Electronically Signed   By: Garald Balding M.D.   On: 07/25/2017 22:12    Procedures Procedures (including critical care time)  Medications Ordered in ED Medications  metoprolol tartrate (LOPRESSOR) injection 5 mg (5 mg Intravenous Given 07/25/17 2221)     Initial Impression / Assessment and Plan / ED Course   I have reviewed the triage vital signs and the nursing notes.  Pertinent labs & imaging results that were available during my care of the patient were reviewed by me and considered in my medical decision making (see chart for details).    EKG: EKG interpreted by myself shows A. fib with RVR rate 117.  No injury.   EKG Interpretation: EKG, after treatment.  Interpreted by myself. Date/Time:  Thursday July 25 2017 22:33:28 EDT Ventricular Rate:  84 PR Interval:    QRS Duration: 92 QT Interval:  383 QTC Calculation: 453 R Axis:   63 Text Interpretation:  Sinus rhythm Consider left atrial enlargement Low voltage with right axis deviation Confirmed by Tanna Furry [phone]) on 07/25/2017 10:59:08 PM   3.4.  Magnesium 2.1.  X-ray shows congestion but no frank CHF.  Given metoprolol 5 and repeated with an additional 5 and converts to sinus rhythm.  Prior to his conversion I discussed the case with cardiologist on-call Dr.Chakravarpti.  It was rate controlled but still in atrial fibrillation.  Patient is anticoagulated.  It sounds like this is been paroxysmal.  Her plan at that time was to discharge to increase the patient's Coreg.  After a second dose of Lopressor the patient converts.  Postprocedure EKG shows sinus rhythm without injury or ectopy.  We will increase his Coreg from 6.25 twice daily to 12.5 twice daily.  Cardial G follow-up.  Check and record pulse daily.  Hold additional doses for hypotension less than 100 or bradycardia less than 70.   Final Clinical Impressions(s) / ED Diagnoses   Final diagnoses:  Persistent atrial fibrillation [facility])    ED Discharge Orders    None       Tanna Furry, MD 07/25/17 2326

## 2017-07-26 ENCOUNTER — Telehealth: Payer: Self-pay | Admitting: Cardiology

## 2017-07-26 ENCOUNTER — Encounter: Payer: Self-pay | Admitting: Family Medicine

## 2017-07-26 NOTE — Telephone Encounter (Signed)
Spoke with pt, they were able to convert him yesterday in the ER. Looking at his cardio device this morning he is still in normal rhythm and his heart rate is good. He wants to make sure it is okay for him to wait to see dr harding next week. Reassurance given to the patient, he is on the appropriate medications to protect him and also hopefully help him stay in rhythm. Follow up scheduled next week with dr harding and Patient voiced understanding to call if he has questions or problems.

## 2017-07-26 NOTE — Telephone Encounter (Signed)
New Message  Pt states that he went to the ER due to being in afib and the doctor told him to get an appt today, informed the pt that the 1st available would be 4/25 but pt would like to speak to a nurse. Please call

## 2017-07-29 ENCOUNTER — Telehealth: Payer: Self-pay | Admitting: Medical Oncology

## 2017-07-29 NOTE — Telephone Encounter (Signed)
Ronald [name] consulted with Dr. Tammi Klippel 06/20/17 regarding radiation for prostate cancer.  He was not ready to commit but wanted to discuss further with Dr. Cassandria Anger at his TURP follow up appointment. Today he states that he went to [name] for a second opinion and has decided to move forward with radiation. He would like to delay the start of treatment until June. He continues to have some urinary issues and just started Myrbetrig. I explained that Enid Derry will reach out to him with appointments. He voiced understanding.

## 2017-07-30 ENCOUNTER — Other Ambulatory Visit: Payer: Self-pay | Admitting: *Deleted

## 2017-07-30 ENCOUNTER — Encounter: Payer: Self-pay | Admitting: Cardiology

## 2017-07-30 ENCOUNTER — Ambulatory Visit (INDEPENDENT_AMBULATORY_CARE_PROVIDER_SITE_OTHER): Payer: Federal, State, Local not specified - PPO | Admitting: Cardiology

## 2017-07-30 VITALS — BP 162/86 | HR 80 | Ht 71.0 in | Wt 235.2 lb

## 2017-07-30 DIAGNOSIS — R7989 Other specified abnormal findings of blood chemistry: Secondary | ICD-10-CM

## 2017-07-30 DIAGNOSIS — E785 Hyperlipidemia, unspecified: Secondary | ICD-10-CM

## 2017-07-30 DIAGNOSIS — I480 Paroxysmal atrial fibrillation: Secondary | ICD-10-CM | POA: Diagnosis not present

## 2017-07-30 DIAGNOSIS — I10 Essential (primary) hypertension: Secondary | ICD-10-CM

## 2017-07-30 DIAGNOSIS — Z79899 Other long term (current) drug therapy: Secondary | ICD-10-CM | POA: Diagnosis not present

## 2017-07-30 DIAGNOSIS — F17210 Nicotine dependence, cigarettes, uncomplicated: Secondary | ICD-10-CM | POA: Diagnosis not present

## 2017-07-30 DIAGNOSIS — I429 Cardiomyopathy, unspecified: Secondary | ICD-10-CM | POA: Diagnosis not present

## 2017-07-30 DIAGNOSIS — I43 Cardiomyopathy in diseases classified elsewhere: Secondary | ICD-10-CM

## 2017-07-30 MED ORDER — METOPROLOL TARTRATE 25 MG PO TABS: ORAL_TABLET | ORAL | 6 refills | Status: DC

## 2017-07-30 MED ORDER — CARVEDILOL 12.5 MG PO TABS: 12.5000 mg | ORAL_TABLET | Freq: Two times a day (BID) | ORAL | 3 refills | Status: DC

## 2017-07-30 NOTE — Patient Instructions (Addendum)
MEDICATION INSTRUCTION  -- INCREASE  CARVEDILOL TO 12.5 MG  TWICE A DAY   --- IF YOU HAVE A BREAKTHROUGH  WITH YOUR ATRIAL FIB   TAKE METOPROLOL TARTRATE 25 MG ALONG WITH AN EXTRA 200 MG AMIODARONE . IF  ATRIAL FIB CONTINUES MORE THAN 2 to 3 hours- call office to let us know you are going to the hospital.    Will schedule for Sept 2019 before upcoming office appointment Your physician has recommended that you have a pulmonary function test. Pulmonary Function Tests are a group of tests that measure how well air moves in and out of your lungs.    Your physician recommends that you schedule a follow-up appointment in Sept  2019 [place].  If you need a refill on your cardiac medications before your next appointment, please call your pharmacy.

## 2017-07-30 NOTE — Progress Notes (Signed)
PCP: [name], MD  Clinic Note: Chief Complaint  Patient presents with  . Hospitalization Follow-up    ER visit for A. fib, converted spontaneously with beta-blocker  . Atrial Fibrillation  . Cardiomyopathy    Likely stress-induced in setting of severe sepsis (November 2018) -EF back to normal.    HPI: Ronald [name] is a 72 y.o. male with a PMH below who presents today for ER f/u - presented on 4/18 to ER with Afib - converted with IV BB.  He has a history of persistent atrial fibrillation on amiodarone with recent "stress-induced CM" during a hospitalization with sepsis.  November 12-19th 2018: Admitted for sepsis.  Also noted to have acute combined heart failure with reduced ejection fraction on echo. -- was completely swollen. By the time I saw him in follow-up, he is feeling much better. No further heart failure symptoms of PND, orthopnea with improved edema. F/u Echo in jan 2019 - EF 55- 60%mildly dilated aortic root(38 mm) and ascending aorta (44 mm). Compared to prior echo, LVEF has improved.     Ronald [name] was last seen on Jan 10 to follow-up his post cardiomyopathy echo.  At that time he is feeling well.  His heart rate is stabilized his heart failure stabilized.  He Lasix.  Has not had any breakthrough A. fib on amiodarone.  Recent Hospitalizations:   July 25, 2017 ER visit for recurrent A. fib with RVR.  He noted feeling poorly off and on for about the entire day.  He is actually noted just feeling tired and fatigued all day long.  His Fitbit indicated that he had an irregular heartbeat -- rate 120 bpm.  He then checked his rhythm with his Kartia portable monitor that did indicate A. fib. ->  He therefore went to the emergency room.  Noted to be in A. fib RVR with rates in the 110-120 bpm.  He was given 2 doses of IV Lopressor that converted into sinus rhythm.  Studies Personally Reviewed - (if available, images/films reviewed: From Epic Chart or Care  Everywhere)  No new studies  Interval History: Ronald [name] returns today now understanding what he feels when he has his A. fib.  He is happy that his rhythm converted to sinus rhythm with IV beta-blocker and was wondering if he needed to have something on hand to try to convert him in the future.  He otherwise has been doing very well with the degree that one episode.  He has noted that his blood pressure has been drifting up however.  He has not really noticed any PND orthopnea.  No edema, but he is taking his Lasix relatively routinely -although not twice daily .  When he was in A. fib he felt an irregular sensation in his chest but no "pain or discomfort".  Nothing to suggest angina.  He has not had any of these symptoms or even dyspnea with rest or exertion while not in A. fib.  starting to finally feel better now.  Overall, his heart failure symptoms have stabilized with fairly well controlled edema. He only takes extra Lasix 1-2 x /wk .  Only 1 day of swelling - missed dose of lasix this past weekend..  Gained wgt over holidays - eating more - not associated with PND orthopnea. He seems to have recovered from his illness with no further fevers or chills. He has not had any recurrence of any A. fib symptoms with no rapid irregular heartbeats or palpitations.  Chest tightness pressure with rest or exertion. No sick be/near syncope or TIA/amaurosis fugax. No claudication.  ROS: A comprehensive was performed. Review of Systems  Constitutional: Negative for malaise/fatigue and weight loss (- Gained weight over holidays).  HENT: Negative for congestion (Pollen related stuffy nose).   Eyes: Negative for blurred vision.  Respiratory: Positive for cough (Allergy related). Negative for shortness of breath and wheezing.   Cardiovascular: Negative for palpitations and leg swelling (Controlled).  Gastrointestinal: Negative for blood in stool, constipation, heartburn and melena.  Genitourinary: Negative for  hematuria.  Musculoskeletal: Positive for joint pain.  Neurological: Negative for dizziness.  Endo/Heme/Allergies: Positive for environmental allergies (Mostly pollen). Bruises/bleeds easily.  Psychiatric/Behavioral: Negative.   All other systems reviewed and are negative.  I have reviewed and (if needed) personally updated the patient's problem list, medications, allergies, past medical and surgical history, social and family history.   Past Medical History:  Diagnosis Date  . Allergic rhinitis   . Anticoagulant long-term use    eliquis  . BPH (benign prostatic hyperplasia)   . COPD with emphysema [facility])    pulmologist-  dr Halford Chessman  . Dyspnea    occasional per pt  . History of colon polyps    tubular adenoma 2013  . History of gout    last episode early June 2017-- (feet)  . History of sepsis 02/18/2017   per d/c note probable uti, acute chf, acute renal failure, hypoxia  . Hyperlipidemia   . Hypertension   . Nocturia    flomax has improved symptoms  . OSA on CPAP    per study 08-03-2004  Severe OSA  . Persistent atrial fibrillation [facility])    cardiologist --  dr Dorris Carnes--  post cardioversion 05-07-2014  . Prostate cancer [facility]) urologsit-  dr Alyson Ingles--- as of 05-21-2017 per pt last PSA 11 approx.   Dx  2014--  stage T1c, Gleason 3+3=6, PSA 6.67--  Active surveillance  . 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 Seasonal allergies   . Sigmoid diverticulosis   . Systolic and diastolic CHF, chronic ([place])   . Tinea versicolor   . Type 2 diabetes mellitus ([place])   . Urgency of urination    improved on flomax  . Wears hearing aid in both ears     Past Surgical History:  Procedure Laterality Date  . CARDIOVERSION N/A 05/07/2014   Procedure: CARDIOVERSION;  Surgeon: [name], MD;  Location: [facility] ENDOSCOPY;  Service: Cardiovascular;  Laterality: N/A;  . CARDIOVERSION N/A 09/09/2014   Procedure: CARDIOVERSION;  Surgeon:  Lelon Perla, MD;  Location: [facility] ENDOSCOPY;  Service: Cardiovascular;  Laterality: N/A;  successfully  . CATARACT EXTRACTION W/ INTRAOCULAR LENS  IMPLANT, BILATERAL  2018  . COLONOSCOPY  last one 06-07-2011  . HYDROCELE EXCISION Bilateral 09/26/2015   Procedure: HYDROCELECTOMY ADULT;  Surgeon: [name], MD;  Location: [facility];  Service: Urology;  Laterality: Bilateral;  . LAMINECTOMY AND MICRODISCECTOMY LUMBAR SPINE  12-23-2003   Left L5 -- S1  . LAPAROSCOPIC BILATERAL INGUINAL HERNIA REPAIR/  UMBILICAL HERNIA REPAIR WITH MESH/  ASPIRATION LEFT HYDROCELE  07-11-2012  . NM MYOVIEW LTD  05/18/2014   Low risk study. Normal perfusion: No ischemia or infarction. Mild LV dysfunction - 46% (does not correlate with echocardiographic EF of 50-55%)  . PROSTATE BIOPSY  05/15/12   Clinically both [place]  . TEE WITHOUT CARDIOVERSION N/A 05/07/2014   Procedure: TRANSESOPHAGEAL ECHOCARDIOGRAM (TEE);  Surgeon: [name], MD;  Location: [facility] ENDOSCOPY;  Service: Cardiovascular;  Laterality: N/A;   mild atherosclerosis plaque of aorta,  mild AR, MR, and TR,  no cardiac source of emboli  . TONSILLECTOMY  as child  . TRANSTHORACIC ECHOCARDIOGRAM  11/'18; 1/'19   a) In setting of sepsis: EF of 40-45%.  Diffuse hypokinesis.  No RWMA.  Biatrial enlargement.;; b) ** f/u Jan 2019: Normal LVF 55-60%** , mildly dilated aortic root(38 mm) and ascending aorta (44 mm). Compared to prior echo, LVEF has improved.  . TRANSURETHRAL RESECTION OF PROSTATE N/A 05/30/2017   Procedure: TRANSURETHRAL RESECTION OF THE PROSTATE (TURP);  Surgeon: [name], MD;  Location: [facility] ORS;  Service: Urology;  Laterality: N/A;    Current Meds  Medication Sig  . acetaminophen (TYLENOL) 500 MG tablet Take 500 mg by mouth every 6 (six) hours as needed for mild pain or moderate pain.  Marland Kitchen ALPRAZolam (XANAX) 0.25 MG tablet Take 1 tablet (0.25 mg total) at bedtime as needed by mouth for anxiety or sleep.  Marland Kitchen  amiodarone (PACERONE) 200 MG tablet Take 1 tablet (200 mg total) by mouth daily. (Patient taking differently: Take 200 mg by mouth every morning. )  . apixaban (ELIQUIS) 5 MG TABS tablet Take 1 tablet (5 mg total) by mouth 2 (two) times daily.  Marland Kitchen atorvastatin (LIPITOR) 10 MG tablet TAKE 1 TABLET (10 MG TOTAL) BY MOUTH EVERY EVENING.  Marland Kitchen azelastine (ASTELIN) 0.1 % nasal spray Place 1 spray into both nostrils 2 (two) times daily. Use in each nostril as directed  . colchicine 0.6 MG tablet Two tabs at onset of gout flare and then one tab twice daily as needed  . fluticasone (FLONASE) 50 MCG/ACT nasal spray Place 2 sprays into both nostrils daily as needed for allergies or rhinitis.  . furosemide (LASIX) 40 MG tablet Take 1 tablet (40 mg total) 2 (two) times daily by mouth. Take 1 tablet in the morning and second tablet at 2pm daily.  Marland Kitchen glipiZIDE (GLUCOTROL) 5 MG tablet TAKE 1/2 TABLETS BY MOUTH TWICE DAILY  . glucose blood (ACCU-CHEK AVIVA PLUS) test strip Test once daily dx e11.9  . hydrocortisone (ANUSOL-HC) 2.5 % rectal cream Apply 2 (two) times daily topically.  Marland Kitchen KLOR-CON M20 20 MEQ tablet TAKE 2 TABLETS BY MOUTH DAILY (Patient taking differently: TAKE 1 TABLET BY MOUTH TWICE DAILY)  . Lancets (ACCU-CHEK SOFT TOUCH) lancets Check blood sugars once per day. DX: E11.9  . montelukast (SINGULAIR) 10 MG tablet TAKE 1 TABLET BY MOUTH EVERYDAY AT BEDTIME  . Naphazoline HCl (CLEAR EYES OP) Place 1-2 drops into both eyes 3 (three) times daily as needed (for dry/irritated eyes).  . nicotine (NICODERM CQ - DOSED IN MG/24 HOURS) 21 mg/24hr patch Place 1 patch (21 mg total) daily onto the skin.  Marland Kitchen oxyCODONE-acetaminophen (PERCOCET/ROXICET) 5-325 MG tablet Take 1 tablet by mouth every 4 (four) hours as needed for moderate pain.  . [DISCONTINUED] carvedilol (COREG) 6.25 MG tablet TAKE 1 TABLET BY MOUTH TWICE A DAY WITH MEAL    No Known Allergies  Social History   Socioeconomic History  . Marital status:  Married    Spouse name: None  . Number of children: 2  Occupational History  . Occupation: Scientist, clinical (histocompatibility and immunogenetics): [employer]  Tobacco Use  . Smoking status: Former Smoker    Packs/day: 1.00    Years: 55.00    Pack years: 55.00    Types: Cigarettes    Last attempt  to quit: 02/18/2017    Years since quitting: 0.1  . Smokeless tobacco: Never Used  . Tobacco comment: trying to quit  mid Jan 2017 - as of 09-20-2015  down to , per pt 2-3 cigarettes per day  Substance and Sexual Activity  . Alcohol use: Yes    Comment: daily  . Drug use: No  . Sexual activity: None  Other Topics Concern  . None  Social History Narrative   Married 25 years   2 children but not with this wife    family history includes Breast cancer in his mother; Ovarian cancer in his sister; Stroke in his unknown relative.  Wt Readings from Last 3 Encounters:  07/30/17 235 lb 3.2 oz (106.7 kg)  07/25/17 230 lb (104.3 kg)  07/08/17 235 lb 9.6 oz (106.9 kg)    PHYSICAL EXAM BP (!) 162/86   Pulse 80   Ht '5\' 11"'  (1.803 m)   Wt 235 lb 3.2 oz (106.7 kg)   BMI 32.80 kg/m  Physical Exam  Constitutional: He is oriented to person, place, and time. He appears well-developed and well-nourished. No distress.  Well groomed.   HENT:  Head: Normocephalic and atraumatic.  Mouth/Throat: Oropharynx is clear and moist.  Eyes: Conjunctivae and EOM are normal.  Neck: Normal range of motion. Normal carotid pulses, no hepatojugular reflux and no JVD present. Carotid bruit is not present.  Cardiovascular: Normal rate, regular rhythm, normal heart sounds and intact distal pulses. Exam reveals no gallop and no friction rub.  No murmur heard. Pulmonary/Chest: Effort normal and breath sounds normal. No respiratory distress. He has no wheezes. He has no rales.  Abdominal: Soft. Bowel sounds are normal. He exhibits no distension. There is no tenderness. There is no rebound.  Musculoskeletal: Normal range of motion. He exhibits no  edema (trace bilaeral ankle swelling).  Neurological: He is alert and oriented to person, place, and time.  Psychiatric: He has a normal mood and affect. His behavior is normal. Judgment and thought content normal.  Nursing note and vitals reviewed.    Adult ECG Report Normal sinus rhythm, rate 80 bpm.  Nonspecific ST-T wave changes noted.  Relatively normal/stable EKG.  Other studies Reviewed: Additional studies/ records that were reviewed today include:  Recent Labs: He is due for PCP to check his lipids soon.   Lab Results  Component Value Date   TSH 1.650 02/19/2017   Lab Results  Component Value Date   ALT 43 03/08/2017   AST 28 03/08/2017   ALKPHOS 84 03/08/2017   BILITOT 0.9 03/08/2017    ASSESSMENT / PLAN: Problem List Items Addressed This Visit    Paroxysmal atrial fibrillation ([place]): CHA2DS2-VASc Score 3 - On Eliquis - Primary (Chronic)    Breakthrough episode of A. fib that was successfully converted with beta-blocker. His blood pressure going higher and heart rate increasing, I think we can increase carvedilol up to 12.5 twice daily.  He apparently has been taking on his own 12.5 mg in the morning was 620 5 in the evening.  He is on amiodarone 200 mg a day.  I think if he has breakthrough episodes, I would like for him to take an additional 200 mg amiodarone.  When he also has quite a bit of leftover metoprolol 25 mg metoprolol tartrate which he would take as well. --Much like he did this weekend, if he remains in A. fib or present after hours with greater than 3 hours of A. fib, he should go  to the hospital and he could theoretically be cardioverted either chemically or electrically since he is on anti-correlation.  He remains on Eliquis without any issues.  Remains on amiodarone: Has had routine follow-up labs, but needs PFTs.      Relevant Medications   carvedilol (COREG) 12.5 MG tablet   metoprolol tartrate (LOPRESSOR) 25 MG tablet   Other Relevant Orders    EKG 12-Lead (Completed)   Pulmonary Function Test   On amiodarone therapy (Chronic)    Continue to monitor LFTs, TFTs. Would add ESR and CRP to routine follow-up labs to look for pulmonary toxicity.   We will also order PFTs. Needs annual eye exams      Relevant Orders   EKG 12-Lead (Completed)   Pulmonary Function Test   Hyperlipidemia with target LDL less than 70 (Chronic)    On low-dose atorvastatin.  Most recent labs from March of last year showed well-controlled LDL.  Due to have labs checked soon.      Relevant Medications   carvedilol (COREG) 12.5 MG tablet   metoprolol tartrate (LOPRESSOR) 25 MG tablet   Essential hypertension (Chronic)    Blood pressure is a little bit up today.  He is on ARB and I am can increase his beta-blocker dose for additional control.      Relevant Medications   carvedilol (COREG) 12.5 MG tablet   metoprolol tartrate (LOPRESSOR) 25 MG tablet   Cigarette smoker two packs a day or less (Chronic)    He actually says he is finally quit smoking.      Cardiomyopathy due to systemic disease ([place])    Essentially resolved now EF back to normal by recent echo. No heart failure symptoms.  On reduced dose of Lasix. Continue beta-blocker and ARB.      Relevant Medications   carvedilol (COREG) 12.5 MG tablet   metoprolol tartrate (LOPRESSOR) 25 MG tablet      Current medicines are reviewed at length with the patient today. (+/- concerns) none The following changes have been made:See below  Patient Instructions  MEDICATION INSTRUCTION  -- INCREASE  CARVEDILOL TO 12.5 MG  TWICE A DAY   --- IF YOU HAVE A BREAKTHROUGH  WITH YOUR ATRIAL FIB   TAKE METOPROLOL TARTRATE 25 MG ALONG WITH AN EXTRA 200 MG AMIODARONE . IF  ATRIAL FIB CONTINUES MORE THAN 2 to 3 hours- call office to let us know you are going to the hospital.    Will schedule for Sept 2019 before upcoming office appointment Your physician has recommended that you have a pulmonary  function test. Pulmonary Function Tests are a group of tests that measure how well air moves in and out of your lungs.    Your physician recommends that you schedule a follow-up appointment in Sept  2019 [place].  If you need a refill on your cardiac medications before your next appointment, please call your pharmacy.         Studies Ordered:   Orders Placed This Encounter  Procedures  . EKG 12-Lead  . Pulmonary Function Test      Glenetta Hew, M.D., M.S. Interventional Cardiologist   Pager # [phone] Phone # [phone] [address] [place], [place] [address]

## 2017-07-31 ENCOUNTER — Encounter: Payer: Self-pay | Admitting: Cardiology

## 2017-07-31 DIAGNOSIS — C61 Malignant neoplasm of prostate: Secondary | ICD-10-CM | POA: Diagnosis not present

## 2017-07-31 NOTE — Assessment & Plan Note (Signed)
Blood pressure is a little bit up today.  He is on ARB and I am can increase his beta-blocker dose for additional control.

## 2017-07-31 NOTE — Assessment & Plan Note (Signed)
Breakthrough episode of A. fib that was successfully converted with beta-blocker. His blood pressure going higher and heart rate increasing, I think we can increase carvedilol up to 12.5 twice daily.  He apparently has been taking on his own 12.5 mg in the morning was 620 5 in the evening.  He is on amiodarone 200 mg a day.  I think if he has breakthrough episodes, I would like for him to take an additional 200 mg amiodarone.  When he also has quite a bit of leftover metoprolol 25 mg metoprolol tartrate which he would take as well. --Much like he did this weekend, if he remains in A. fib or present after hours with greater than 3 hours of A. fib, he should go to the hospital and he could theoretically be cardioverted either chemically or electrically since he is on anti-correlation.  He remains on Eliquis without any issues.  Remains on amiodarone: Has had routine follow-up labs, but needs PFTs.

## 2017-07-31 NOTE — Assessment & Plan Note (Signed)
Essentially resolved now EF back to normal by recent echo. No heart failure symptoms.  On reduced dose of Lasix. Continue beta-blocker and ARB.

## 2017-07-31 NOTE — Assessment & Plan Note (Signed)
He actually says he is finally quit smoking.

## 2017-07-31 NOTE — Assessment & Plan Note (Signed)
On low-dose atorvastatin.  Most recent labs from March of last year showed well-controlled LDL.  Due to have labs checked soon.

## 2017-07-31 NOTE — Assessment & Plan Note (Signed)
Continue to monitor LFTs, TFTs. Would add ESR and CRP to routine follow-up labs to look for pulmonary toxicity.   We will also order PFTs. Needs annual eye exams

## 2017-08-02 ENCOUNTER — Emergency Department (HOSPITAL_BASED_OUTPATIENT_CLINIC_OR_DEPARTMENT_OTHER)
Admission: EM | Admit: 2017-08-02 | Discharge: 2017-08-02 | Disposition: A | Discharge status: Home / Self Care | Payer: Federal, State, Local not specified - PPO | Attending: Emergency Medicine | Admitting: Emergency Medicine

## 2017-08-02 ENCOUNTER — Encounter (HOSPITAL_BASED_OUTPATIENT_CLINIC_OR_DEPARTMENT_OTHER): Payer: Self-pay | Admitting: *Deleted

## 2017-08-02 ENCOUNTER — Other Ambulatory Visit: Payer: Self-pay

## 2017-08-02 DIAGNOSIS — I10 Essential (primary) hypertension: Secondary | ICD-10-CM | POA: Insufficient documentation

## 2017-08-02 DIAGNOSIS — I4891 Unspecified atrial fibrillation: Secondary | ICD-10-CM | POA: Diagnosis not present

## 2017-08-02 DIAGNOSIS — J449 Chronic obstructive pulmonary disease, unspecified: Secondary | ICD-10-CM | POA: Insufficient documentation

## 2017-08-02 DIAGNOSIS — Z87891 Personal history of nicotine dependence: Secondary | ICD-10-CM | POA: Diagnosis not present

## 2017-08-02 DIAGNOSIS — I480 Paroxysmal atrial fibrillation: Secondary | ICD-10-CM | POA: Diagnosis not present

## 2017-08-02 DIAGNOSIS — Z7901 Long term (current) use of anticoagulants: Secondary | ICD-10-CM | POA: Diagnosis not present

## 2017-08-02 DIAGNOSIS — Z7984 Long term (current) use of oral hypoglycemic drugs: Secondary | ICD-10-CM | POA: Insufficient documentation

## 2017-08-02 DIAGNOSIS — Z79899 Other long term (current) drug therapy: Secondary | ICD-10-CM | POA: Diagnosis not present

## 2017-08-02 DIAGNOSIS — Z8546 Personal history of malignant neoplasm of prostate: Secondary | ICD-10-CM | POA: Insufficient documentation

## 2017-08-02 NOTE — ED Notes (Signed)
ED Provider at bedside. 

## 2017-08-02 NOTE — ED Provider Notes (Signed)
[facility] Provider Note: Georgena Spurling, MD, FACEP  CSN: [id number] MRN: [id number] ARRIVAL: 08/02/17 at [place]: [place]  Atrial Fibrillation   HISTORY OF PRESENT ILLNESS  08/02/17 12:44 AM Ronald [name] is a 72 y.o. male with a history of atrial fibrillation on chronic amiodarone, Coreg and Eliquis.  He has a home heart monitor with which he frequently checks his heart rhythm.  It told him he was in atrial fibrillation late yesterday evening.  He took an extra dose of amiodarone and was supposed to take an extra dose of metoprolol but is out of metoprolol.  He waited about an hour and then came to the ED to be evaluated.  His heart rate went as high as 110.  He denies palpitations, lightheadedness, chest pain or shortness of breath.  On arrival his EKG shows normal sinus rhythm.   Past Medical History:  Diagnosis Date  . Allergic rhinitis   . Anticoagulant long-term use    eliquis  . BPH (benign prostatic hyperplasia)   . COPD with emphysema [facility])    pulmologist-  dr Halford Chessman  . Dyspnea    occasional per pt  . History of colon polyps    tubular adenoma 2013  . History of gout    last episode early June 2017-- (feet)  . History of sepsis 02/18/2017   per d/c note probable uti, acute chf, acute renal failure, hypoxia  . Hyperlipidemia   . Hypertension   . Nocturia    flomax has improved symptoms  . OSA on CPAP    per study 08-03-2004  Severe OSA  . Persistent atrial fibrillation [facility])    cardiologist --  dr Dorris Carnes--  post cardioversion 05-07-2014  . Prostate cancer [facility]) urologsit-  dr Alyson Ingles--- as of 05-21-2017 per pt last PSA 11 approx.   Dx  2014--  stage T1c, Gleason 3+3=6, PSA 6.67--  Active surveillance  . 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 ([place])    pulmologist-  dr Halford Chessman  . Seasonal allergies   . Sigmoid diverticulosis   . Systolic and diastolic CHF, chronic ([place])   . Tinea versicolor   . Type  2 diabetes mellitus ([place])   . Urgency of urination    improved on flomax  . Wears hearing aid in both ears     Past Surgical History:  Procedure Laterality Date  . CARDIOVERSION N/A 05/07/2014   Procedure: CARDIOVERSION;  Surgeon: [name], MD;  Location: [facility] ENDOSCOPY;  Service: Cardiovascular;  Laterality: N/A;  . CARDIOVERSION N/A 09/09/2014   Procedure: CARDIOVERSION;  Surgeon: Lelon Perla, MD;  Location: [facility] ENDOSCOPY;  Service: Cardiovascular;  Laterality: N/A;  successfully  . CATARACT EXTRACTION W/ INTRAOCULAR LENS  IMPLANT, BILATERAL  2018  . COLONOSCOPY  last one 06-07-2011  . HYDROCELE EXCISION Bilateral 09/26/2015   Procedure: HYDROCELECTOMY ADULT;  Surgeon: [name], MD;  Location: [facility];  Service: Urology;  Laterality: Bilateral;  . LAMINECTOMY AND MICRODISCECTOMY LUMBAR SPINE  12-23-2003   Left L5 -- S1  . LAPAROSCOPIC BILATERAL INGUINAL HERNIA REPAIR/  UMBILICAL HERNIA REPAIR WITH MESH/  ASPIRATION LEFT HYDROCELE  07-11-2012  . NM MYOVIEW LTD  05/18/2014   Low risk study. Normal perfusion: No ischemia or infarction. Mild LV dysfunction - 46% (does not correlate with echocardiographic EF of 50-55%)  . PROSTATE BIOPSY  05/15/12   Clinically both [place]  . TEE WITHOUT CARDIOVERSION N/A 05/07/2014   Procedure: TRANSESOPHAGEAL ECHOCARDIOGRAM (  TEE);  Surgeon: [name], MD;  Location[facility] ENDOSCOPY;  Service: Cardiovascular;  Laterality: N/A;   mild atherosclerosis plaque of aorta,  mild AR, MR, and TR,  no cardiac source of emboli  . TONSILLECTOMY  as child  . TRANSTHORACIC ECHOCARDIOGRAM  11/'18; 1/'19   a) In setting of sepsis: EF of 40-45%.  Diffuse hypokinesis.  No RWMA.  Biatrial enlargement.;; b) ** f/u Jan 2019: Normal LVF 55-60%** , mildly dilated aortic root(38 mm) and ascending aorta (44 mm). Compared to prior echo, LVEF has improved.  . TRANSURETHRAL RESECTION OF PROSTATE N/A 05/30/2017   Procedure: TRANSURETHRAL RESECTION OF THE  PROSTATE (TURP);  Surgeon: [name], MD;  Location: [facility] ORS;  Service: Urology;  Laterality: N/A;    Family History  Problem Relation Age of Onset  . Breast cancer Mother   . Stroke Unknown        Unknown   . Ovarian cancer Sister   . Colon cancer Neg Hx   . Esophageal cancer Neg Hx   . Rectal cancer Neg Hx   . Stomach cancer Neg Hx     Social History   Tobacco Use  . Smoking status: Former Smoker    Packs/day: 1.00    Years: 60.00    Pack years: 60.00    Types: Cigarettes    Last attempt to quit: 03/02/2017    Years since quitting: 0.4  . Smokeless tobacco: Never Used  Substance Use Topics  . Alcohol use: Yes    Alcohol/week: 8.4 oz    Types: 14 Standard drinks or equivalent per week    Comment: 2 drinks daily  . Drug use: No    Prior to Admission medications   Medication Sig Start Date End Date Taking? Authorizing Provider  acetaminophen (TYLENOL) 500 MG tablet Take 500 mg by mouth every 6 (six) hours as needed for mild pain or moderate pain.    [provider]  ALPRAZolam Duanne Moron) 0.25 MG tablet Take 1 tablet (0.25 mg total) at bedtime as needed by mouth for anxiety or sleep. 02/25/17   Eugenie Filler, MD  amiodarone (PACERONE) 200 MG tablet Take 1 tablet (200 mg total) by mouth daily. Patient taking differently: Take 200 mg by mouth every morning.  03/27/17   Leonie Man, MD  apixaban (ELIQUIS) 5 MG TABS tablet Take 1 tablet (5 mg total) by mouth 2 (two) times daily. 06/06/17   McKenzie, Candee Furbish, MD  atorvastatin (LIPITOR) 10 MG tablet TAKE 1 TABLET (10 MG TOTAL) BY MOUTH EVERY EVENING. 01/21/17   Burchette, Alinda Sierras, MD  azelastine (ASTELIN) 0.1 % nasal spray Place 1 spray into both nostrils 2 (two) times daily. Use in each nostril as directed 12/22/15   Chesley Mires, MD  carvedilol (COREG) 12.5 MG tablet Take 1 tablet (12.5 mg total) by mouth 2 (two) times daily. 07/30/17 10/28/17  Leonie Man, MD  colchicine 0.6 MG tablet Two tabs at  onset of gout flare and then one tab twice daily as needed 01/30/17   [name], MD  fluticasone (FLONASE) 50 MCG/ACT nasal spray Place 2 sprays into both nostrils daily as needed for allergies or rhinitis. 05/08/14   Hongalgi, Lenis Dickinson, MD  furosemide (LASIX) 40 MG tablet Take 1 tablet (40 mg total) 2 (two) times daily by mouth. Take 1 tablet in the morning and second tablet at 2pm daily. 02/25/17   Eugenie Filler, MD  glipiZIDE (GLUCOTROL) 5 MG tablet TAKE 1/2 TABLETS  BY MOUTH TWICE DAILY 06/24/17   Burchette, Alinda Sierras, MD  glucose blood (ACCU-CHEK AVIVA PLUS) test strip Test once daily dx e11.9 03/13/17   Burchette, Alinda Sierras, MD  hydrocortisone (ANUSOL-HC) 2.5 % rectal cream Apply 2 (two) times daily topically. 02/25/17   Eugenie Filler, MD  KLOR-CON M20 20 MEQ tablet TAKE 2 TABLETS BY MOUTH DAILY Patient taking differently: TAKE 1 TABLET BY MOUTH TWICE DAILY 04/26/17   Burchette, Alinda Sierras, MD  Lancets (ACCU-CHEK SOFT TOUCH) lancets Check blood sugars once per day. DX: E11.9 10/24/15   Burchette, Alinda Sierras, MD  losartan (COZAAR) 25 MG tablet Take 1 tablet (25 mg total) by mouth daily. 03/07/17 06/05/17  Leonie Man, MD  metoprolol tartrate (LOPRESSOR) 25 MG tablet Take  1 tablet if  you have breakthrough atrial fib with extra dose of 200 mg Amiodarone. 07/30/17   Leonie Man, MD  montelukast (SINGULAIR) 10 MG tablet TAKE 1 TABLET BY MOUTH EVERYDAY AT BEDTIME 07/17/17   Burchette, Alinda Sierras, MD  Naphazoline HCl (CLEAR EYES OP) Place 1-2 drops into both eyes 3 (three) times daily as needed (for dry/irritated eyes).    [provider]  nicotine (NICODERM CQ - DOSED IN MG/24 HOURS) 21 mg/24hr patch Place 1 patch (21 mg total) daily onto the skin. 02/26/17   Eugenie Filler, MD  oxyCODONE-acetaminophen (PERCOCET/ROXICET) 5-325 MG tablet Take 1 tablet by mouth every 4 (four) hours as needed for moderate pain. 05/31/17   McKenzie, Candee Furbish, MD    Allergies Patient has no known  allergies.   REVIEW OF SYSTEMS  Negative except as noted here or in the History of Present Illness.   PHYSICAL EXAMINATION  Initial Vital Signs Blood pressure (!) 146/98, pulse 84, temperature 97.6 F (36.4 C), temperature source Oral, resp. rate 18, height 5\' 11"  (1.803 m), weight 106.6 kg (235 lb), SpO2 96 %.  Examination General: Well-developed, well-nourished male in no acute distress; appearance consistent with age of record HENT: normocephalic; atraumatic Eyes: pupils equal, round and reactive to light; extraocular muscles intact Neck: supple Heart: regular rate and rhythm Lungs: clear to auscultation bilaterally Abdomen: soft; nondistended; nontender; bowel sounds present Extremities: No deformity; full range of motion; pulses normal Neurologic: Awake, alert and oriented; motor function intact in all extremities and symmetric; no facial droop Skin: Warm and dry Psychiatric: Normal mood and affect   RESULTS  Summary of this visit's results, reviewed by myself:   EKG Interpretation  Date/Time:  Friday August 02 2017 00:40:44 EDT Ventricular Rate:  82 PR Interval:    QRS Duration: 92 QT Interval:  402 QTC Calculation: 470 R Axis:   112 Text Interpretation:  Sinus rhythm Left posterior fascicular block No significant change was found Confirmed by Adelyn Roscher [phone]) on 08/02/2017 12:43:53 AM      Laboratory Studies: No results found for this or any previous visit (from the past 24 hour(s)). Imaging Studies: No results found.  ED COURSE and MDM  Nursing notes and initial vitals signs, including pulse oximetry, reviewed.  Vitals:   08/02/17 0038 08/02/17 0040 08/02/17 0041  BP:  (!) 146/98 (!) 146/98  Pulse:  84 83  Resp:  18 18  Temp:  97.6 F (36.4 C)   TempSrc:  Oral   SpO2:  96% 95%  Weight: 106.6 kg (235 lb)    Height: 5\' 11"  (1.803 m)     The patient has spontaneously converted back to normal sinus rhythm.  He has a prescription  for metoprolol  waiting at his pharmacy for pickup in the morning.  He does not wish to be observed to have any further work-up at this time.  PROCEDURES    ED DIAGNOSES     ICD-10-CM   1. Paroxysmal atrial fibrillation ([place]) I48.0        Salaam Battershell, MD 08/02/17 [phone]

## 2017-08-02 NOTE — ED Triage Notes (Signed)
Pt reports that he thinks he is in afib-reports that his watch alerted him to a high heart rate at 2300 on 4.25.19. Denies SOB, CP, palpitations. Reports that he took an extra amlodipine at 2330.

## 2017-08-08 DIAGNOSIS — Z961 Presence of intraocular lens: Secondary | ICD-10-CM | POA: Diagnosis not present

## 2017-08-08 DIAGNOSIS — E119 Type 2 diabetes mellitus without complications: Secondary | ICD-10-CM | POA: Diagnosis not present

## 2017-08-12 DIAGNOSIS — E119 Type 2 diabetes mellitus without complications: Secondary | ICD-10-CM | POA: Diagnosis not present

## 2017-08-12 LAB — HM DIABETES EYE EXAM

## 2017-08-13 ENCOUNTER — Other Ambulatory Visit (INDEPENDENT_AMBULATORY_CARE_PROVIDER_SITE_OTHER): Payer: Federal, State, Local not specified - PPO

## 2017-08-13 DIAGNOSIS — R7989 Other specified abnormal findings of blood chemistry: Secondary | ICD-10-CM

## 2017-08-13 LAB — BASIC METABOLIC PANEL
BUN: 17 mg/dL (ref 6–23)
CHLORIDE: 100 meq/L (ref 96–112)
CO2: 29 mEq/L (ref 19–32)
CREATININE: 1.44 mg/dL (ref 0.40–1.50)
Calcium: 9.2 mg/dL (ref 8.4–10.5)
GFR: 51.22 mL/min — ABNORMAL LOW (ref >60.00)
GLUCOSE: 137 mg/dL — AB (ref 70–99)
POTASSIUM: 5 meq/L (ref 3.5–5.1)
Sodium: 137 mEq/L (ref 135–145)

## 2017-08-19 ENCOUNTER — Encounter: Payer: Self-pay | Admitting: Family Medicine

## 2017-08-22 ENCOUNTER — Other Ambulatory Visit: Payer: Self-pay | Admitting: Urology

## 2017-08-22 ENCOUNTER — Encounter: Payer: Self-pay | Admitting: Urology

## 2017-08-22 ENCOUNTER — Telehealth: Payer: Self-pay | Admitting: *Deleted

## 2017-08-22 DIAGNOSIS — C61 Malignant neoplasm of prostate: Secondary | ICD-10-CM

## 2017-08-22 NOTE — Telephone Encounter (Signed)
Called patient to inform of fid. markers and space oar on 09-09-17 @ 4 pm @ [place] and his sim on 09-11-17 @ 11 am @ Dr. Johny Shears office, lvm for a return call

## 2017-09-03 DIAGNOSIS — C61 Malignant neoplasm of prostate: Secondary | ICD-10-CM | POA: Diagnosis not present

## 2017-09-03 DIAGNOSIS — N401 Enlarged prostate with lower urinary tract symptoms: Secondary | ICD-10-CM | POA: Diagnosis not present

## 2017-09-03 DIAGNOSIS — R351 Nocturia: Secondary | ICD-10-CM | POA: Diagnosis not present

## 2017-09-05 ENCOUNTER — Encounter (HOSPITAL_BASED_OUTPATIENT_CLINIC_OR_DEPARTMENT_OTHER): Payer: Self-pay | Admitting: *Deleted

## 2017-09-05 ENCOUNTER — Other Ambulatory Visit: Payer: Self-pay

## 2017-09-05 NOTE — Progress Notes (Signed)
Spoke w/ pt via phone for pre-op interview.  Npo after mn w/ exception clear liquids diet until 1000 (no cream/ milk products).  Arrive at 1400.  Needs istat 8.  Current ekg and cxr in chart and epic.  Will take amiodarone and coreg am dos w/ sips of water and do fleet enema prior to arriving [place].

## 2017-09-09 ENCOUNTER — Encounter: Payer: Self-pay | Admitting: Medical Oncology

## 2017-09-09 ENCOUNTER — Ambulatory Visit (HOSPITAL_BASED_OUTPATIENT_CLINIC_OR_DEPARTMENT_OTHER): Payer: Federal, State, Local not specified - PPO | Admitting: Anesthesiology

## 2017-09-09 ENCOUNTER — Encounter (HOSPITAL_BASED_OUTPATIENT_CLINIC_OR_DEPARTMENT_OTHER)
Admission: RE | Disposition: A | Discharge status: Home / Self Care | Payer: Self-pay | Source: Ambulatory Visit | Attending: Urology

## 2017-09-09 ENCOUNTER — Ambulatory Visit (HOSPITAL_BASED_OUTPATIENT_CLINIC_OR_DEPARTMENT_OTHER)
Admission: RE | Admit: 2017-09-09 | Discharge: 2017-09-09 | Disposition: A | Discharge status: Home / Self Care | Payer: Federal, State, Local not specified - PPO | Source: Ambulatory Visit | Attending: Urology | Admitting: Urology

## 2017-09-09 ENCOUNTER — Encounter (HOSPITAL_BASED_OUTPATIENT_CLINIC_OR_DEPARTMENT_OTHER): Payer: Self-pay | Admitting: Anesthesiology

## 2017-09-09 DIAGNOSIS — Z79899 Other long term (current) drug therapy: Secondary | ICD-10-CM | POA: Insufficient documentation

## 2017-09-09 DIAGNOSIS — M109 Gout, unspecified: Secondary | ICD-10-CM | POA: Diagnosis not present

## 2017-09-09 DIAGNOSIS — I5043 Acute on chronic combined systolic (congestive) and diastolic (congestive) heart failure: Secondary | ICD-10-CM | POA: Diagnosis not present

## 2017-09-09 DIAGNOSIS — F17210 Nicotine dependence, cigarettes, uncomplicated: Secondary | ICD-10-CM | POA: Diagnosis not present

## 2017-09-09 DIAGNOSIS — E118 Type 2 diabetes mellitus with unspecified complications: Secondary | ICD-10-CM | POA: Diagnosis not present

## 2017-09-09 DIAGNOSIS — G4733 Obstructive sleep apnea (adult) (pediatric): Secondary | ICD-10-CM | POA: Diagnosis not present

## 2017-09-09 DIAGNOSIS — I110 Hypertensive heart disease with heart failure: Secondary | ICD-10-CM | POA: Insufficient documentation

## 2017-09-09 DIAGNOSIS — I481 Persistent atrial fibrillation: Secondary | ICD-10-CM | POA: Insufficient documentation

## 2017-09-09 DIAGNOSIS — E785 Hyperlipidemia, unspecified: Secondary | ICD-10-CM | POA: Diagnosis not present

## 2017-09-09 DIAGNOSIS — E119 Type 2 diabetes mellitus without complications: Secondary | ICD-10-CM | POA: Diagnosis not present

## 2017-09-09 DIAGNOSIS — J449 Chronic obstructive pulmonary disease, unspecified: Secondary | ICD-10-CM | POA: Insufficient documentation

## 2017-09-09 DIAGNOSIS — Z7984 Long term (current) use of oral hypoglycemic drugs: Secondary | ICD-10-CM | POA: Diagnosis not present

## 2017-09-09 DIAGNOSIS — I5042 Chronic combined systolic (congestive) and diastolic (congestive) heart failure: Secondary | ICD-10-CM | POA: Diagnosis not present

## 2017-09-09 DIAGNOSIS — Z7901 Long term (current) use of anticoagulants: Secondary | ICD-10-CM | POA: Diagnosis not present

## 2017-09-09 DIAGNOSIS — C61 Malignant neoplasm of prostate: Secondary | ICD-10-CM | POA: Insufficient documentation

## 2017-09-09 DIAGNOSIS — Z8546 Personal history of malignant neoplasm of prostate: Secondary | ICD-10-CM

## 2017-09-09 HISTORY — DX: Cardiomyopathy, unspecified: I42.9

## 2017-09-09 HISTORY — PX: GOLD SEED IMPLANT: SHX6343

## 2017-09-09 HISTORY — DX: Benign prostatic hyperplasia with lower urinary tract symptoms: N40.1

## 2017-09-09 HISTORY — PX: SPACE OAR INSTILLATION: SHX6769

## 2017-09-09 LAB — POCT I-STAT, CHEM 8
BUN: 13 mg/dL (ref 6–20)
CALCIUM ION: 1.19 mmol/L (ref 1.15–1.40)
CREATININE: 1.2 mg/dL (ref 0.61–1.24)
Chloride: 103 mmol/L (ref 101–111)
GLUCOSE: 98 mg/dL (ref 65–99)
HCT: 43 % (ref 39.0–52.0)
HEMOGLOBIN: 14.6 g/dL (ref 13.0–17.0)
Potassium: 3.9 mmol/L (ref 3.5–5.1)
Sodium: 139 mmol/L (ref 135–145)
TCO2: 24 mmol/L (ref 22–32)

## 2017-09-09 SURGERY — INSERTION, GOLD SEEDS
Anesthesia: Monitor Anesthesia Care | Site: Rectum

## 2017-09-09 MED ORDER — LIDOCAINE 2% (20 MG/ML) 5 ML SYRINGE
INTRAMUSCULAR | Status: AC
  Filled 2017-09-09: qty 5

## 2017-09-09 MED ORDER — LACTATED RINGERS IV SOLN
INTRAVENOUS | Status: DC
  Administered 2017-09-09: 14:00:00 via INTRAVENOUS
  Filled 2017-09-09: qty 1000

## 2017-09-09 MED ORDER — FENTANYL CITRATE (PF) 100 MCG/2ML IJ SOLN
25.0000 ug | INTRAMUSCULAR | Status: DC | PRN
  Filled 2017-09-09: qty 1

## 2017-09-09 MED ORDER — FENTANYL CITRATE (PF) 100 MCG/2ML IJ SOLN
INTRAMUSCULAR | Status: DC | PRN
  Administered 2017-09-09: 25 ug via INTRAVENOUS

## 2017-09-09 MED ORDER — LIDOCAINE 2% (20 MG/ML) 5 ML SYRINGE
INTRAMUSCULAR | Status: DC | PRN
  Administered 2017-09-09: 50 mg via INTRAVENOUS

## 2017-09-09 MED ORDER — PROPOFOL 10 MG/ML IV BOLUS
INTRAVENOUS | Status: DC | PRN
  Administered 2017-09-09: 40 mg via INTRAVENOUS
  Administered 2017-09-09 (×2): 20 mg via INTRAVENOUS

## 2017-09-09 MED ORDER — FENTANYL CITRATE (PF) 100 MCG/2ML IJ SOLN
INTRAMUSCULAR | Status: AC
  Filled 2017-09-09: qty 2

## 2017-09-09 MED ORDER — TRAMADOL HCL 50 MG PO TABS: 50.0000 mg | ORAL_TABLET | Freq: Four times a day (QID) | ORAL | 0 refills | Status: DC | PRN

## 2017-09-09 MED ORDER — CEFAZOLIN SODIUM-DEXTROSE 2-4 GM/100ML-% IV SOLN
INTRAVENOUS | Status: AC
  Filled 2017-09-09: qty 100

## 2017-09-09 MED ORDER — CEFAZOLIN SODIUM-DEXTROSE 2-4 GM/100ML-% IV SOLN
2.0000 g | INTRAVENOUS | Status: AC
  Administered 2017-09-09: 2 g via INTRAVENOUS
  Filled 2017-09-09: qty 100

## 2017-09-09 MED ORDER — LIDOCAINE HCL 1 % IJ SOLN
INTRAMUSCULAR | Status: DC | PRN
  Administered 2017-09-09: 19 mL via INTRAMUSCULAR

## 2017-09-09 MED ORDER — ONDANSETRON HCL 4 MG/2ML IJ SOLN
INTRAMUSCULAR | Status: AC
  Filled 2017-09-09: qty 2

## 2017-09-09 MED ORDER — PROPOFOL 10 MG/ML IV BOLUS
INTRAVENOUS | Status: AC
  Filled 2017-09-09: qty 20

## 2017-09-09 MED ORDER — PROPOFOL 500 MG/50ML IV EMUL
INTRAVENOUS | Status: DC | PRN
  Administered 2017-09-09: 50 ug/kg/min via INTRAVENOUS

## 2017-09-09 MED ORDER — MEPERIDINE HCL 25 MG/ML IJ SOLN
6.2500 mg | INTRAMUSCULAR | Status: DC | PRN
  Filled 2017-09-09: qty 1

## 2017-09-09 MED ORDER — MIDAZOLAM HCL 2 MG/2ML IJ SOLN
0.5000 mg | Freq: Once | INTRAMUSCULAR | Status: DC | PRN
  Filled 2017-09-09: qty 2

## 2017-09-09 MED ORDER — SODIUM CHLORIDE 0.9 % IJ SOLN
INTRAMUSCULAR | Status: DC | PRN
  Administered 2017-09-09: 10 mL

## 2017-09-09 MED ORDER — PROMETHAZINE HCL 25 MG/ML IJ SOLN
6.2500 mg | INTRAMUSCULAR | Status: DC | PRN
  Filled 2017-09-09: qty 1

## 2017-09-09 SURGICAL SUPPLY — 16 items
DRSG TEGADERM 4X4.75 (GAUZE/BANDAGES/DRESSINGS) ×3 IMPLANT
DRSG TEGADERM 8X12 (GAUZE/BANDAGES/DRESSINGS) ×6 IMPLANT
GAUZE SPONGE 4X4 12PLY STRL (GAUZE/BANDAGES/DRESSINGS) ×3 IMPLANT
GLOVE BIO SURGEON STRL SZ8 (GLOVE) ×3 IMPLANT
GLOVE ECLIPSE 8.0 STRL XLNG CF (GLOVE) ×3 IMPLANT
GOWN STRL REUS W/TWL XL LVL3 (GOWN DISPOSABLE) ×3 IMPLANT
IMPL SPACEOAR SYSTEM 10ML (MISCELLANEOUS) ×2 IMPLANT
IMPLANT SPACEOAR SYSTEM 10ML (MISCELLANEOUS) ×3
KIT TURNOVER CYSTO (KITS) ×3 IMPLANT
MARKER GOLD PRELOAD 1.2X3 (Urological Implant) ×6 IMPLANT
NEEDLE HYPO 22GX1.5 SAFETY (NEEDLE) ×3 IMPLANT
PACK CYSTO (CUSTOM PROCEDURE TRAY) ×3 IMPLANT
SEED GOLD PRELOAD 1.2X3 (Urological Implant) ×9 IMPLANT
SURGILUBE 2OZ TUBE FLIPTOP (MISCELLANEOUS) ×3 IMPLANT
SYR CONTROL 10ML LL (SYRINGE) ×3 IMPLANT
UNDERPAD 30X30 (UNDERPADS AND DIAPERS) ×6 IMPLANT

## 2017-09-09 NOTE — Discharge Instructions (Signed)
Transrectal Ultrasound-Guided Prostate Gold Seed Placement Transrectal ultrasound-guided prostate gold seed placement is a procedure to place small metal seeds (fiducial markers)in or around a tumor on the prostate. These seeds help show exactly where a tumor is located. This helps guide radiation therapy directly at the prostate tumor, which avoids killing nearby healthy tissue. During this procedure, a small device (probe) is lubricated and placed inside the rectum. The probe makes sound waves that create a picture of the prostate (transrectal ultrasound). The images will be used to help guide the placement of the gold seeds. This procedure is also called fiducial marker placement. Tell a health care provider about:  Any allergies you have.  All medicines you are taking, including vitamins, herbs, eye drops, creams, and over-the-counter medicines.  Any problems you or family members have had with anesthetic medicines.  Any medical conditions you have.  Any blood disorders you have.  Any surgeries you have had. What are the risks? Generally, this is a safe procedure. However, problems may occur, including:  Infection.  Bleeding.  Allergic reactions to medicines or dyes.  Damage to other structures or organs.  The gold seeds moving to another part of the body. This is rare.  What happens before the procedure? Medicines  Ask your health care provider about: ? Changing or stopping your regular medicines. This is especially important if you are taking diabetes medicines or blood thinners. ? Taking over-the-counter medicines, vitamins, herbs, and supplements. ? Taking medicines such as aspirin and ibuprofen. These medicines can thin your blood. Do not take these medicines unless your health care provider tells you to take them.  You may be given antibiotic medicine to help prevent an infection. Staying hydrated Follow instructions from your health care provider about hydration,  which may include:  Up to 2 hours before the procedure - you may continue to drink clear liquids, such as water, clear fruit juice, black coffee, and plain tea.  Eating and drinking restrictions Follow instructions from your health care provider about eating and drinking, which may include:  8 hours before the procedure - stop eating heavy meals or foods such as meat, fried foods, or fatty foods.  6 hours before the procedure - stop eating light meals or foods, such as toast or cereal.  6 hours before the procedure - stop drinking milk or drinks that contain milk.  2 hours before the procedure - stop drinking clear liquids.  General instructions  If you were asked to do a bowel prep before your procedure, follow instructions from your health care provider about how to do this.  You may have a blood sample taken.  Plan to have someone take you home from the hospital or clinic.  Plan to have a responsible adult care for you for at least 24 hours after you leave the hospital or clinic. This is important. What happens during the procedure?  To lower your risk of infection: ? Your health care team will wash or sanitize their hands. ? Hair may be removed from the surgical area. ? Your skin will be washed with soap.  Monitors may be placed on your body to track your heart rate, blood pressure, and breathing.  An IV will be inserted into one of your veins.  You will be given one or more of the following: ? A medicine to help you relax (sedative). ? A medicine to numb the area (local anesthetic). ? A medicine that is injected into an area of your body to  numb everything below the injection site (regional anesthetic).  A lubricated probe will be inserted into your rectum to perform the ultrasound.  You will be placed on your left side with your knees bent up toward your chest.  Using the ultrasound as a guide, the health care provider will insert a needle between your rectum and  scrotum and will place it near the tumor.  The needle will inject the gold seed into the area around the tumor. This will be repeated with two more gold seeds. More seeds may be added depending on the size and location of the tumor.  The needle and probe will be removed. The procedure may vary among health care providers and hospitals. What happens after the procedure?  You may have imaging tests done to check the placement of the gold seeds. This may include a CT scan or ultrasound.  Your blood pressure, heart rate, breathing rate, and blood oxygen level will continue to be monitored until the medicines you were given have worn off.  Do not drive for 24 hours if you were given a sedative.  You will be given medicine to help with pain, if needed. Summary  This procedure involves placing small metal seeds (fiducial markers) in or around a prostate tumor.  The gold seeds help guide radiation therapy directly at the prostate tumor, which avoids killing nearby healthy tissue.  During this procedure, a small probe will be placed in the rectum to take images of the prostate (transrectal ultrasound). The images will help guide the placement of the gold seeds. This information is not intended to replace advice given to you by your health care provider. Make sure you discuss any questions you have with your health care provider. Document Released: 07/10/2016 Document Revised: 07/10/2016 Document Reviewed: 07/10/2016 Elsevier Interactive Patient Education  2018 [place] [facility] Care Instructions  Activity: Get plenty of rest for the remainder of the day. A responsible individual must stay with you for 24 hours following the procedure.  For the next 24 hours, DO NOT: -Drive a car -Paediatric nurse -Drink alcoholic beverages -Take any medication unless instructed by your physician -Make any legal decisions or sign important papers.  Meals: Start with liquid foods  such as gelatin or soup. Progress to regular foods as tolerated. Avoid greasy, spicy, heavy foods. If nausea and/or vomiting occur, drink only clear liquids until the nausea and/or vomiting subsides. Call your physician if vomiting continues.  Special Instructions/Symptoms: Your throat may feel dry or sore from the anesthesia or the breathing tube placed in your throat during surgery. If this causes discomfort, gargle with warm salt water. The discomfort should disappear within 24 hours.  If you had a scopolamine patch placed behind your ear for the management of post- operative nausea and/or vomiting:  1. The medication in the patch is effective for 72 hours, after which it should be removed.  Wrap patch in a tissue and discard in the trash. Wash hands thoroughly with soap and water. 2. You may remove the patch earlier than 72 hours if you experience unpleasant side effects which may include dry mouth, dizziness or visual disturbances. 3. Avoid touching the patch. Wash your hands with soap and water after contact with the patch.

## 2017-09-09 NOTE — H&P (Signed)
Urology Admission H&P  Chief Complaint: prostate cancer  History of Present Illness: [name] is a 72yo with a hx of intermediate risk prostate cancer who is undergoing IMRT. He has mild LUTS after TURP  Past Medical History:  Diagnosis Date  . Allergic rhinitis   . Anticoagulant long-term use    eliquis  . Cardiomyopathy due to systemic disease [facility])    followed by dr harding  . COPD with emphysema [facility])    pulmologist-  dr Halford Chessman  . Dyspnea    occasional per pt  . History of colon polyps    tubular adenoma 2013  . History of gout    09-05-2017 last flare-up  05/ 2019 3 wks ago, feet  . History of sepsis 02/18/2017   per d/c note probable uti, acute chf, acute renal failure, hypoxia  . Hyperlipidemia   . Hyperplasia of prostate with lower urinary tract symptoms (LUTS)   . Hypertension   . OSA on CPAP    per study 08-03-2004  Severe OSA  . Persistent atrial fibrillation [facility])    cardiologist --  dr Dorris Carnes--  post cardioversion 05-07-2014  . Prostate cancer [facility]) urologsit-  dr Alyson Ingles--- as of 05-21-2017 per pt last PSA 11 approx.   Dx  2014--  stage T1c, Gleason 3+3=6, PSA 6.67--  Active surveillance/  04/ 2019  Stage T1b, Gleason 3+4, PSA 12.8- plan external radiation therapy    . 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 Seasonal allergies   . Sigmoid diverticulosis   . Systolic and diastolic CHF, chronic [facility])    cardiologist-  [name]  . Tinea versicolor   . Type 2 diabetes mellitus ([place])   . Wears hearing aid in both ears    Past Surgical History:  Procedure Laterality Date  . CARDIOVERSION N/A 05/07/2014   Procedure: CARDIOVERSION;  Surgeon: [name], MD;  Location: [facility] ENDOSCOPY;  Service: Cardiovascular;  Laterality: N/A;  . CARDIOVERSION N/A 09/09/2014   Procedure: CARDIOVERSION;  Surgeon: Lelon Perla, MD;  Location: [facility] ENDOSCOPY;  Service: Cardiovascular;  Laterality: N/A;  successfully  .  CATARACT EXTRACTION W/ INTRAOCULAR LENS  IMPLANT, BILATERAL  08 and 09/  2018  . COLONOSCOPY  last one 06-07-2011  . HYDROCELE EXCISION Bilateral 09/26/2015   Procedure: HYDROCELECTOMY ADULT;  Surgeon: [name], MD;  Location: [facility];  Service: Urology;  Laterality: Bilateral;  . LAMINECTOMY AND MICRODISCECTOMY LUMBAR SPINE  12-23-2003   Left L5 -- S1  . LAPAROSCOPIC BILATERAL INGUINAL HERNIA REPAIR/  UMBILICAL HERNIA REPAIR WITH MESH/  ASPIRATION LEFT HYDROCELE  07-11-2012  . NM MYOVIEW LTD  05/18/2014   Low risk study. Normal perfusion: No ischemia or infarction. Mild LV dysfunction - 46% (does not correlate with echocardiographic EF of 50-55%)  . PROSTATE BIOPSY  05/15/12   Clinically both Lobes  . TEE WITHOUT CARDIOVERSION N/A 05/07/2014   Procedure: TRANSESOPHAGEAL ECHOCARDIOGRAM (TEE);  Surgeon: [name], MD;  Location: [facility] ENDOSCOPY;  Service: Cardiovascular;  Laterality: N/A;   mild atherosclerosis plaque of aorta,  mild AR, MR, and TR,  no cardiac source of emboli  . TONSILLECTOMY  as child  . TRANSTHORACIC ECHOCARDIOGRAM  11/'18; 1/'19   a) In setting of sepsis: EF of 40-45%.  Diffuse hypokinesis.  No RWMA.  Biatrial enlargement.;; b) ** f/u Jan 2019: Normal LVF 55-60%** , mildly dilated aortic root(38 mm) and ascending aorta (44 mm). Compared to prior  echo, LVEF has improved.  . TRANSURETHRAL RESECTION OF PROSTATE N/A 05/30/2017   Procedure: TRANSURETHRAL RESECTION OF THE PROSTATE (TURP);  Surgeon: [name], MD;  Location: [facility]S;  Service: Urology;  Laterality: N/A;    Home Medications:  Current Facility-Administered Medications  Medication Dose Route Frequency Provider Last Rate Last Dose  . ceFAZolin (ANCEF) IVPB 2g/100 mL premix  2 g Intravenous 30 min Pre-Op McKenzie, Candee Furbish, MD      . lactated ringers infusion   Intravenous Continuous Montez Hageman, MD 50 mL/hr at 09/09/17 1429     Allergies: No Known Allergies  Family History   Problem Relation Age of Onset  . Breast cancer Mother   . Stroke Unknown        Unknown   . Ovarian cancer Sister   . Colon cancer Neg Hx   . Esophageal cancer Neg Hx   . Rectal cancer Neg Hx   . Stomach cancer Neg Hx    Social History:  reports that he has been smoking cigarettes.  He has a 45.00 pack-year smoking history. He has never used smokeless tobacco. He reports that he drinks about 8.4 oz of alcohol per week. He reports that he does not use drugs.  Review of Systems  All other systems reviewed and are negative.   Physical Exam:  Vital signs in last 24 hours: Temp:  [98.7 F (37.1 C)] 98.7 F (37.1 C) (06/03 1349) Pulse Rate:  [74] 74 (06/03 1349) Resp:  [18] 18 (06/03 1349) BP: (138)/(97) 138/97 (06/03 1349) SpO2:  [98 %] 98 % (06/03 1349) Weight:  [107.3 kg (236 lb 8 oz)] 107.3 kg (236 lb 8 oz) (06/03 1349) Physical Exam  Constitutional: He is oriented to person, place, and time. He appears well-developed and well-nourished.  HENT:  Head: Normocephalic and atraumatic.  Eyes: Pupils are equal, round, and reactive to light. EOM are normal.  Neck: Normal range of motion. No thyromegaly present.  Cardiovascular: Normal rate and regular rhythm.  Respiratory: Effort normal. No respiratory distress.  GI: Soft. He exhibits no distension.  Musculoskeletal: Normal range of motion. He exhibits no edema.  Neurological: He is alert and oriented to person, place, and time.  Skin: Skin is warm and dry.  Psychiatric: He has a normal mood and affect. His behavior is normal. Judgment and thought content normal.    Laboratory Data:  Results for orders placed or performed during the hospital encounter of 09/09/17 (from the past 24 hour(s))  I-STAT, chem 8     Status: None   Collection Time: 09/09/17  2:30 PM  Result Value Ref Range   Sodium 139 135 - 145 mmol/L   Potassium 3.9 3.5 - 5.1 mmol/L   Chloride 103 101 - 111 mmol/L   BUN 13 6 - 20 mg/dL   Creatinine, Ser 1.20 0.61  - 1.24 mg/dL   Glucose, Bld 98 65 - 99 mg/dL   Calcium, Ion 1.19 1.15 - 1.40 mmol/L   TCO2 24 22 - 32 mmol/L   Hemoglobin 14.6 13.0 - 17.0 g/dL   HCT 43.0 39.0 - 52.0 %   No results found for this or any previous visit (from the past 240 hour(s)). Creatinine: Recent Labs    09/09/17 1430  CREATININE 1.20   Baseline Creatinine: 1.2  Impression/Assessment:  72yo with intermediate risk prostate cancer  Plan:  The risks/benefits/alternatives to fiducial markers and SpaceOAR was explained to the patient and he understands and wsihes to proceed with surgery  Nicolette Bang 09/09/2017, 3:00 PM

## 2017-09-09 NOTE — Transfer of Care (Signed)
Immediate Anesthesia Transfer of Care Note  Patient: Ronald [name]  Procedure(s) Performed: GOLD SEED IMPLANT (N/A Prostate) SPACE OAR INSTILLATION (N/A Rectum)  Patient Location: PACU  Anesthesia Type:MAC  Level of Consciousness: awake, alert  and oriented  Airway & Oxygen Therapy: Patient Spontanous Breathing and Patient connected to nasal cannula oxygen  Post-op Assessment: Report given to RN  Post vital signs: Reviewed and stable  Last Vitals:  Vitals Value Taken Time  BP 137/84 09/09/2017  3:37 PM  Temp 36.7 C 09/09/2017  3:38 PM  Pulse 70 09/09/2017  3:38 PM  Resp 19 09/09/2017  3:38 PM  SpO2 95 % 09/09/2017  3:38 PM  Vitals shown include unvalidated device data.  Last Pain:  Vitals:   09/09/17 1416  TempSrc:   PainSc: 0-No pain      Patients Stated Pain Goal: 7 (01/64/29 0379)  Complications: No apparent anesthesia complications

## 2017-09-09 NOTE — Anesthesia Procedure Notes (Signed)
Procedure Name: MAC Performed by: Bonney Aid, CRNA Pre-anesthesia Checklist: Patient identified, Timeout performed, Emergency Drugs available, Suction available and Patient being monitored Patient Re-evaluated:Patient Re-evaluated prior to induction Oxygen Delivery Method: Nasal cannula Placement Confirmation: positive ETCO2

## 2017-09-09 NOTE — Anesthesia Preprocedure Evaluation (Addendum)
Anesthesia Evaluation  Patient identified by MRN, date of birth, ID band Patient awake    Reviewed: Allergy & Precautions, NPO status , Patient's Chart, lab work & pertinent test results, reviewed documented beta blocker date and time   History of Anesthesia Complications Negative for: history of anesthetic complications  Airway Mallampati: II  TM Distance: >3 FB Neck ROM: Full    Dental no notable dental hx. (+) Dental Advisory Given   Pulmonary shortness of breath, sleep apnea and Continuous Positive Airway Pressure Ventilation , COPD,  COPD inhaler, Current Smoker,    Pulmonary exam normal breath sounds clear to auscultation       Cardiovascular hypertension, Pt. on medications and Pt. on home beta blockers (-) anginaNormal cardiovascular exam+ dysrhythmias Atrial Fibrillation  Rhythm:Regular Rate:Normal  5/78 ECHO: Systolic function was normal. EF 55- 60%, no regional wall motion abnormalities, mildly dilated aortic root and asc aorta   Neuro/Psych negative neurological ROS  negative psych ROS   GI/Hepatic negative GI ROS, Neg liver ROS,   Endo/Other  diabetes (glu 98), Type 2, Oral Hypoglycemic AgentsMorbid obesity  Renal/GU negative Renal ROS   Prostate cancer    Musculoskeletal negative musculoskeletal ROS (+)   Abdominal (+) + obese,   Peds negative pediatric ROS (+)  Hematology Eliquis: last dose 6/1   Anesthesia Other Findings   Reproductive/Obstetrics negative OB ROS                           Anesthesia Physical Anesthesia Plan  ASA: III  Anesthesia Plan: MAC   Post-op Pain Management:    Induction:   PONV Risk Score and Plan: 0 and Ondansetron and Treatment may vary due to age or medical condition  Airway Management Planned: Natural Airway and Nasal Cannula  Additional Equipment:   Intra-op Plan:   Post-operative Plan:   Informed Consent: I have reviewed the  patients History and Physical, chart, labs and discussed the procedure including the risks, benefits and alternatives for the proposed anesthesia with the patient or authorized representative who has indicated his/her understanding and acceptance.   Dental advisory given  Plan Discussed with: CRNA and Surgeon  Anesthesia Plan Comments: (Plan routine monitors, MAC)       Anesthesia Quick Evaluation

## 2017-09-09 NOTE — Anesthesia Postprocedure Evaluation (Signed)
Anesthesia Post Note  Patient: Ronald [name]  Procedure(s) Performed: GOLD SEED IMPLANT (N/A Prostate) SPACE OAR INSTILLATION (N/A Rectum)     Patient location during evaluation: PACU Anesthesia Type: MAC Level of consciousness: awake and alert, oriented and patient cooperative Pain management: pain level controlled Vital Signs Assessment: post-procedure vital signs reviewed and stable Respiratory status: spontaneous breathing, nonlabored ventilation and respiratory function stable Cardiovascular status: blood pressure returned to baseline and stable Postop Assessment: no apparent nausea or vomiting and adequate PO intake Anesthetic complications: no    Last Vitals:  Vitals:   09/09/17 1349 09/09/17 1538  BP: (!) 138/97 137/84  Pulse: 74 68  Resp: 18 18  Temp: 37.1 C 36.7 C  SpO2: 98% 95%    Last Pain:  Vitals:   09/09/17 1538  TempSrc:   PainSc: 0-No pain                 Jossie Smoot,E. Jerrico Covello

## 2017-09-09 NOTE — Op Note (Signed)
PRE-OPERATIVE DIAGNOSIS:  Adenocarcinoma of the prostate  POST-OPERATIVE DIAGNOSIS:  Same  PROCEDURE:  Placement of fiducial markers 2. Cystoscopy 3. Placement of SpaceOAR  SURGEON:  Surgeon(s): Nicolette Bang, MD  Radiation oncologist: Tyler Pita, MD  ANESTHESIA:  General  EBL:  Minimal  DRAINS: 39 French Foley catheter  INDICATION: Ronald [name] is a 72 year old with a history of T1c prostate cancer. After discussing treatment options he has elected to proceed with fiducial markers and SpaceOAR  Description of procedure: After informed consent the patient was brought to the major OR, placed on the table and administered general anesthesia. He was then moved to the modified lithotomy position with his perineum perpendicular to the floor. His perineum and genitalia were then sterilely prepped. An official timeout was then performed. A rectal tube was placed in the rectum and the transrectal ultrasound probe was placed in the rectum and affixed to the stand. He was then sterilely draped. Real-time ultrasonography was then used along with the previously developed plan to place a seed at the left mid base, left mid apex, and right mid lateral. This proceeded without difficulty or complication.  We then proceeded to mix the SpaceOAR using the kit supplied from the manufacturer. Once this was complete we placed a sinal needle into the perirectal fat between the rectum and the prostate. Once this was accomplished we injected 2cc of normal saline to hydrodissect the plain. We then instilled the the SpaceOAR through the spinal needle and noted good distribution in the perirectal fat.  The patient was awakened and taken to recovery room in stable and satisfactory condition. He tolerated procedure well and there were no intraoperative complications.

## 2017-09-10 ENCOUNTER — Encounter (HOSPITAL_BASED_OUTPATIENT_CLINIC_OR_DEPARTMENT_OTHER): Payer: Self-pay | Admitting: Urology

## 2017-09-10 LAB — GLUCOSE, CAPILLARY: Glucose-Capillary: 96 mg/dL (ref 65–99)

## 2017-09-11 ENCOUNTER — Ambulatory Visit
Admission: RE | Admit: 2017-09-11 | Discharge: 2017-09-11 | Disposition: A | Discharge status: Home / Self Care | Payer: Federal, State, Local not specified - PPO | Source: Ambulatory Visit | Attending: Radiation Oncology | Admitting: Radiation Oncology

## 2017-09-11 DIAGNOSIS — Z510 Encounter for antineoplastic radiation therapy: Secondary | ICD-10-CM | POA: Diagnosis not present

## 2017-09-11 DIAGNOSIS — C61 Malignant neoplasm of prostate: Secondary | ICD-10-CM | POA: Diagnosis not present

## 2017-09-11 NOTE — Progress Notes (Signed)
  Radiation Oncology         [phone] ________________________________  Name: Ronald [name] MRN: [id number]  Date: 09/11/2017  DOB: [birth year]  SIMULATION AND TREATMENT PLANNING NOTE    ICD-10-CM   1. Prostate cancer [facility]) C61     DIAGNOSIS:  72 y.o. gentleman with stage T1b adenocarcinoma of the prostate with a Gleason's score of 3+4 and a PSA of 12.80  NARRATIVE:  The patient was brought to the [facility].  Identity was confirmed.  All relevant records and images related to the planned course of therapy were reviewed.  The patient freely provided informed written consent to proceed with treatment after reviewing the details related to the planned course of therapy. The consent form was witnessed and verified by the simulation staff.  Then, the patient was set-up in a stable reproducible supine position for radiation therapy.  A vacuum lock pillow device was custom fabricated to position his legs in a reproducible immobilized position.  Then, I performed a urethrogram under sterile conditions to identify the prostatic apex.  CT images were obtained.  Surface markings were placed.  The CT images were loaded into the planning software.  Then the prostate target and avoidance structures including the rectum, bladder, bowel and hips were contoured.  Treatment planning then occurred.  The radiation prescription was entered and confirmed.  A total of one complex treatment devices was fabricated. I have requested : Intensity Modulated Radiotherapy (IMRT) is medically necessary for this case for the following reason:  Rectal sparing.Marland Kitchen  PLAN:  The patient will receive 70 Gy in 28 fractions.  ________________________________  Sheral Apley Tammi Klippel, M.D.

## 2017-09-14 ENCOUNTER — Ambulatory Visit (HOSPITAL_COMMUNITY)
Admission: RE | Admit: 2017-09-14 | Discharge: 2017-09-14 | Disposition: A | Discharge status: Home / Self Care | Payer: Federal, State, Local not specified - PPO | Source: Ambulatory Visit | Attending: Urology | Admitting: Urology

## 2017-09-14 DIAGNOSIS — C61 Malignant neoplasm of prostate: Secondary | ICD-10-CM | POA: Diagnosis not present

## 2017-09-20 DIAGNOSIS — C61 Malignant neoplasm of prostate: Secondary | ICD-10-CM | POA: Diagnosis not present

## 2017-09-20 DIAGNOSIS — Z510 Encounter for antineoplastic radiation therapy: Secondary | ICD-10-CM | POA: Diagnosis not present

## 2017-09-23 ENCOUNTER — Ambulatory Visit
Admission: RE | Admit: 2017-09-23 | Discharge: 2017-09-23 | Disposition: A | Discharge status: Home / Self Care | Payer: Federal, State, Local not specified - PPO | Source: Ambulatory Visit | Attending: Radiation Oncology | Admitting: Radiation Oncology

## 2017-09-23 ENCOUNTER — Encounter: Payer: Self-pay | Admitting: Medical Oncology

## 2017-09-23 DIAGNOSIS — Z510 Encounter for antineoplastic radiation therapy: Secondary | ICD-10-CM | POA: Diagnosis not present

## 2017-09-23 DIAGNOSIS — C61 Malignant neoplasm of prostate: Secondary | ICD-10-CM | POA: Diagnosis not present

## 2017-09-24 ENCOUNTER — Ambulatory Visit
Admission: RE | Admit: 2017-09-24 | Discharge: 2017-09-24 | Disposition: A | Discharge status: Home / Self Care | Payer: Federal, State, Local not specified - PPO | Source: Ambulatory Visit | Attending: Radiation Oncology | Admitting: Radiation Oncology

## 2017-09-24 DIAGNOSIS — Z510 Encounter for antineoplastic radiation therapy: Secondary | ICD-10-CM | POA: Diagnosis not present

## 2017-09-24 DIAGNOSIS — C61 Malignant neoplasm of prostate: Secondary | ICD-10-CM | POA: Diagnosis not present

## 2017-09-25 ENCOUNTER — Ambulatory Visit
Admission: RE | Admit: 2017-09-25 | Discharge: 2017-09-25 | Disposition: A | Discharge status: Home / Self Care | Payer: Federal, State, Local not specified - PPO | Source: Ambulatory Visit | Attending: Radiation Oncology | Admitting: Radiation Oncology

## 2017-09-25 DIAGNOSIS — Z510 Encounter for antineoplastic radiation therapy: Secondary | ICD-10-CM | POA: Diagnosis not present

## 2017-09-25 DIAGNOSIS — C61 Malignant neoplasm of prostate: Secondary | ICD-10-CM | POA: Diagnosis not present

## 2017-09-26 ENCOUNTER — Ambulatory Visit
Admission: RE | Admit: 2017-09-26 | Discharge: 2017-09-26 | Disposition: A | Discharge status: Home / Self Care | Payer: Federal, State, Local not specified - PPO | Source: Ambulatory Visit | Attending: Radiation Oncology | Admitting: Radiation Oncology

## 2017-09-26 DIAGNOSIS — Z510 Encounter for antineoplastic radiation therapy: Secondary | ICD-10-CM | POA: Diagnosis not present

## 2017-09-26 DIAGNOSIS — C61 Malignant neoplasm of prostate: Secondary | ICD-10-CM | POA: Diagnosis not present

## 2017-09-27 ENCOUNTER — Ambulatory Visit
Admission: RE | Admit: 2017-09-27 | Discharge: 2017-09-27 | Disposition: A | Discharge status: Home / Self Care | Payer: Federal, State, Local not specified - PPO | Source: Ambulatory Visit | Attending: Radiation Oncology | Admitting: Radiation Oncology

## 2017-09-27 DIAGNOSIS — C61 Malignant neoplasm of prostate: Secondary | ICD-10-CM | POA: Diagnosis not present

## 2017-09-27 DIAGNOSIS — Z510 Encounter for antineoplastic radiation therapy: Secondary | ICD-10-CM | POA: Diagnosis not present

## 2017-09-27 NOTE — Progress Notes (Signed)
Pt here for patient teaching.  Pt given Radiation and You booklet.  Reviewed areas of pertinence such as diarrhea, fatigue and urinary and bladder changes . Pt able to give teach back of have Imodium on hand and drink plenty of water,. Pt demonstrated understanding, needs reinforcement, no evidence of learning, refused teaching and  of information given and will contact nursing with any questions or concerns.     [email or web address]/treatmentinformation/whattoexpect/index      

## 2017-09-29 ENCOUNTER — Other Ambulatory Visit: Payer: Self-pay | Admitting: Family Medicine

## 2017-09-30 ENCOUNTER — Ambulatory Visit
Admission: RE | Admit: 2017-09-30 | Discharge: 2017-09-30 | Disposition: A | Discharge status: Home / Self Care | Payer: Federal, State, Local not specified - PPO | Source: Ambulatory Visit | Attending: Radiation Oncology | Admitting: Radiation Oncology

## 2017-09-30 DIAGNOSIS — Z510 Encounter for antineoplastic radiation therapy: Secondary | ICD-10-CM | POA: Diagnosis not present

## 2017-09-30 DIAGNOSIS — C61 Malignant neoplasm of prostate: Secondary | ICD-10-CM | POA: Diagnosis not present

## 2017-10-01 ENCOUNTER — Ambulatory Visit
Admission: RE | Admit: 2017-10-01 | Discharge: 2017-10-01 | Disposition: A | Discharge status: Home / Self Care | Payer: Federal, State, Local not specified - PPO | Source: Ambulatory Visit | Attending: Radiation Oncology | Admitting: Radiation Oncology

## 2017-10-01 ENCOUNTER — Ambulatory Visit (INDEPENDENT_AMBULATORY_CARE_PROVIDER_SITE_OTHER): Payer: Federal, State, Local not specified - PPO | Admitting: Family Medicine

## 2017-10-01 ENCOUNTER — Encounter: Payer: Self-pay | Admitting: Family Medicine

## 2017-10-01 VITALS — BP 128/68 | HR 77 | Temp 98.3°F | Wt 239.4 lb

## 2017-10-01 DIAGNOSIS — C61 Malignant neoplasm of prostate: Secondary | ICD-10-CM

## 2017-10-01 DIAGNOSIS — Z510 Encounter for antineoplastic radiation therapy: Secondary | ICD-10-CM | POA: Diagnosis not present

## 2017-10-01 DIAGNOSIS — I10 Essential (primary) hypertension: Secondary | ICD-10-CM

## 2017-10-01 DIAGNOSIS — N179 Acute kidney failure, unspecified: Secondary | ICD-10-CM | POA: Diagnosis not present

## 2017-10-01 DIAGNOSIS — I480 Paroxysmal atrial fibrillation: Secondary | ICD-10-CM | POA: Diagnosis not present

## 2017-10-01 MED ORDER — AZELASTINE HCL 0.1 % NA SOLN: 1.0000 | Freq: Two times a day (BID) | NASAL | 12 refills | Status: DC

## 2017-10-01 NOTE — Progress Notes (Signed)
Subjective:     Patient ID: Ronald [name], male   DOB: 31-Oct-1945, 72 y.o.   MRN: [id number]  HPI Patient is here predominantly to reassess blood pressure. He has history of hypertension and several recent home readings over 628 systolic and 31D diastolic. Currently is on Coreg 12.5 mgs twice daily, Lasix 40 mg daily, and losartan 25 mg daily. Denies any nonsteroidal use. He drinks about 4 ounces of bourbon per day.  Decreased exercise over the past several months and has gained some weight and all these factors likely contributing to recent elevation of blood pressure  Denies any headaches or chest pains. He has multiple chronic problems history of atrial fibrillation, hyperkalemia, hypertension, ongoing nicotine use, BPH, prostate cancer, chronic kidney disease. Currently undergoing radiation therapy for prostate cancer.  Last Fall he had admission for presumed urosepsis following a prostate biopsy. His cultures remained negative.Marland Kitchen His admission was complicated by hypoxia, volume overload, acute systolic heart failure. He been treated with vancomycin and ended up having acute kidney injury. Discharge creatinine at that point 1.75.  Had gradual improvement in creatinine over several months then a couple months ago bumped back up to 1.4 range. He has pending follow-up with nephrology.  Past Medical History:  Diagnosis Date  . Allergic rhinitis   . Anticoagulant long-term use    eliquis  . Cardiomyopathy due to systemic disease [facility])    followed by dr harding  . COPD with emphysema [facility])    pulmologist-  dr Halford Chessman  . Dyspnea    occasional per pt  . History of colon polyps    tubular adenoma 2013  . History of gout    09-05-2017 last flare-up  05/ 2019 3 wks ago, feet  . History of sepsis 02/18/2017   per d/c note probable uti, acute chf, acute renal failure, hypoxia  . Hyperlipidemia   . Hyperplasia of prostate with lower urinary tract symptoms (LUTS)   . Hypertension   . OSA on CPAP     per study 08-03-2004  Severe OSA  . Persistent atrial fibrillation [facility])    cardiologist --  dr Dorris Carnes--  post cardioversion 05-07-2014  . Prostate cancer [facility]) urologsit-  dr Alyson Ingles--- as of 05-21-2017 per pt last PSA 11 approx.   Dx  2014--  stage T1c, Gleason 3+3=6, PSA 6.67--  Active surveillance/  04/ 2019  Stage T1b, Gleason 3+4, PSA 12.8- plan external radiation therapy    . 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 ([place])    pulmologist-  dr Halford Chessman  . Seasonal allergies   . Sigmoid diverticulosis   . Systolic and diastolic CHF, chronic [facility])    cardiologist-  [name]  . Tinea versicolor   . Type 2 diabetes mellitus ([place])   . Wears hearing aid in both ears    Past Surgical History:  Procedure Laterality Date  . CARDIOVERSION N/A 05/07/2014   Procedure: CARDIOVERSION;  Surgeon: [name], MD;  [facility] ENDOSCOPY;  Service: Cardiovascular;  Laterality: N/A;  . CARDIOVERSION N/A 09/09/2014   Procedure: CARDIOVERSION;  Surgeon: Lelon Perla, MD;  Location: [facility] ENDOSCOPY;  Service: Cardiovascular;  Laterality: N/A;  successfully  . CATARACT EXTRACTION W/ INTRAOCULAR LENS  IMPLANT, BILATERAL  08 and 09/  2018  . COLONOSCOPY  last one 06-07-2011  . GOLD SEED IMPLANT N/A 09/09/2017   Procedure: GOLD SEED IMPLANT;  Surgeon: [name], MD;  Location: [facility];  Service: Urology;  Laterality: N/A;  . HYDROCELE  EXCISION Bilateral 09/26/2015   Procedure: HYDROCELECTOMY ADULT;  Surgeon: [name], MD;  Location: [facility];  Service: Urology;  Laterality: Bilateral;  . LAMINECTOMY AND MICRODISCECTOMY LUMBAR SPINE  12-23-2003   Left L5 -- S1  . LAPAROSCOPIC BILATERAL INGUINAL HERNIA REPAIR/  UMBILICAL HERNIA REPAIR WITH MESH/  ASPIRATION LEFT HYDROCELE  07-11-2012  . NM MYOVIEW LTD  05/18/2014   Low risk study. Normal perfusion: No ischemia or infarction. Mild LV dysfunction - 46% (does not correlate with  echocardiographic EF of 50-55%)  . PROSTATE BIOPSY  05/15/12   Clinically both Lobes  . SPACE OAR INSTILLATION N/A 09/09/2017   Procedure: SPACE OAR INSTILLATION;  Surgeon: [name], MD;  Location[facility];  Service: Urology;  Laterality: N/A;  . TEE WITHOUT CARDIOVERSION N/A 05/07/2014   Procedure: TRANSESOPHAGEAL ECHOCARDIOGRAM (TEE);  Surgeon: [name], MD;  Location: [facility] ENDOSCOPY;  Service: Cardiovascular;  Laterality: N/A;   mild atherosclerosis plaque of aorta,  mild AR, MR, and TR,  no cardiac source of emboli  . TONSILLECTOMY  as child  . TRANSTHORACIC ECHOCARDIOGRAM  11/'18; 1/'19   a) In setting of sepsis: EF of 40-45%.  Diffuse hypokinesis.  No RWMA.  Biatrial enlargement.;; b) ** f/u Jan 2019: Normal LVF 55-60%** , mildly dilated aortic root(38 mm) and ascending aorta (44 mm). Compared to prior echo, LVEF has improved.  . TRANSURETHRAL RESECTION OF PROSTATE N/A 05/30/2017   Procedure: TRANSURETHRAL RESECTION OF THE PROSTATE (TURP);  Surgeon: [name], MD;  Location[facility];  Service: Urology;  Laterality: N/A;    reports that he has been smoking cigarettes.  He has a 45.00 pack-year smoking history. He has never used smokeless tobacco. He reports that he drinks about 8.4 oz of alcohol per week. He reports that he does not use drugs. family history includes Breast cancer in his mother; Ovarian cancer in his sister; Stroke in his unknown relative. No Known Allergies   Review of Systems  Constitutional: Negative for fatigue and unexpected weight change.  Eyes: Negative for visual disturbance.  Respiratory: Negative for cough, chest tightness and shortness of breath.   Cardiovascular: Negative for chest pain, palpitations and leg swelling.  Gastrointestinal: Negative for abdominal pain.  Genitourinary: Negative for difficulty urinating.  Neurological: Negative for dizziness, syncope, weakness, light-headedness and headaches.       Objective:    Physical Exam  Constitutional: He appears well-developed and well-nourished.  Neck: Neck supple.  Cardiovascular: Normal rate.  Pulmonary/Chest: Effort normal and breath sounds normal.  Musculoskeletal: He exhibits no edema.       Assessment:     #1 hypertension. Repeat reading by me right and left arm 128/68.  #2 prostate cancer currently treated with radiation therapy  #3 chronic kidney disease with acute kidney injury during hospitalization last fall with gradual improvement since then  #4 atrial fibrillation on eliquis    Plan:     -we recommended continuing monitoring of blood pressure and be in touch if consistently > 140/90. -We challenged him to try to lose some weight -Avoid nonsteroidals -keep alcohol intake to no more than 2-3 ounces of bourbon per day  [name] MD [facility]

## 2017-10-01 NOTE — Patient Instructions (Signed)
Monitor blood pressure and be in touch if consistently > 140/90.  Lose some weight- this can help BP  Try to keep Bourbon intake to < 3 ounces per day.

## 2017-10-02 ENCOUNTER — Ambulatory Visit
Admission: RE | Admit: 2017-10-02 | Discharge: 2017-10-02 | Disposition: A | Discharge status: Home / Self Care | Payer: Federal, State, Local not specified - PPO | Source: Ambulatory Visit | Attending: Radiation Oncology | Admitting: Radiation Oncology

## 2017-10-02 DIAGNOSIS — Z510 Encounter for antineoplastic radiation therapy: Secondary | ICD-10-CM | POA: Diagnosis not present

## 2017-10-02 DIAGNOSIS — C61 Malignant neoplasm of prostate: Secondary | ICD-10-CM | POA: Diagnosis not present

## 2017-10-03 ENCOUNTER — Ambulatory Visit
Admission: RE | Admit: 2017-10-03 | Discharge: 2017-10-03 | Disposition: A | Discharge status: Home / Self Care | Payer: Federal, State, Local not specified - PPO | Source: Ambulatory Visit | Attending: Radiation Oncology | Admitting: Radiation Oncology

## 2017-10-03 DIAGNOSIS — Z510 Encounter for antineoplastic radiation therapy: Secondary | ICD-10-CM | POA: Diagnosis not present

## 2017-10-03 DIAGNOSIS — C61 Malignant neoplasm of prostate: Secondary | ICD-10-CM | POA: Diagnosis not present

## 2017-10-04 ENCOUNTER — Ambulatory Visit
Admission: RE | Admit: 2017-10-04 | Discharge: 2017-10-04 | Disposition: A | Discharge status: Home / Self Care | Payer: Federal, State, Local not specified - PPO | Source: Ambulatory Visit | Attending: Radiation Oncology | Admitting: Radiation Oncology

## 2017-10-04 DIAGNOSIS — Z510 Encounter for antineoplastic radiation therapy: Secondary | ICD-10-CM | POA: Diagnosis not present

## 2017-10-04 DIAGNOSIS — C61 Malignant neoplasm of prostate: Secondary | ICD-10-CM | POA: Diagnosis not present

## 2017-10-07 ENCOUNTER — Ambulatory Visit
Admission: RE | Admit: 2017-10-07 | Discharge: 2017-10-07 | Disposition: A | Discharge status: Home / Self Care | Payer: Federal, State, Local not specified - PPO | Source: Ambulatory Visit | Attending: Radiation Oncology | Admitting: Radiation Oncology

## 2017-10-07 DIAGNOSIS — I481 Persistent atrial fibrillation: Secondary | ICD-10-CM | POA: Insufficient documentation

## 2017-10-07 DIAGNOSIS — Z7901 Long term (current) use of anticoagulants: Secondary | ICD-10-CM | POA: Insufficient documentation

## 2017-10-07 DIAGNOSIS — I10 Essential (primary) hypertension: Secondary | ICD-10-CM | POA: Diagnosis not present

## 2017-10-07 DIAGNOSIS — G4733 Obstructive sleep apnea (adult) (pediatric): Secondary | ICD-10-CM | POA: Insufficient documentation

## 2017-10-07 DIAGNOSIS — C61 Malignant neoplasm of prostate: Secondary | ICD-10-CM | POA: Diagnosis not present

## 2017-10-07 DIAGNOSIS — Z510 Encounter for antineoplastic radiation therapy: Secondary | ICD-10-CM | POA: Insufficient documentation

## 2017-10-07 DIAGNOSIS — J449 Chronic obstructive pulmonary disease, unspecified: Secondary | ICD-10-CM | POA: Diagnosis not present

## 2017-10-08 ENCOUNTER — Ambulatory Visit
Admission: RE | Admit: 2017-10-08 | Discharge: 2017-10-08 | Disposition: A | Discharge status: Home / Self Care | Payer: Federal, State, Local not specified - PPO | Source: Ambulatory Visit | Attending: Radiation Oncology | Admitting: Radiation Oncology

## 2017-10-08 DIAGNOSIS — I10 Essential (primary) hypertension: Secondary | ICD-10-CM | POA: Diagnosis not present

## 2017-10-08 DIAGNOSIS — G4733 Obstructive sleep apnea (adult) (pediatric): Secondary | ICD-10-CM | POA: Diagnosis not present

## 2017-10-08 DIAGNOSIS — C61 Malignant neoplasm of prostate: Secondary | ICD-10-CM | POA: Diagnosis not present

## 2017-10-08 DIAGNOSIS — I481 Persistent atrial fibrillation: Secondary | ICD-10-CM | POA: Diagnosis not present

## 2017-10-08 DIAGNOSIS — I129 Hypertensive chronic kidney disease with stage 1 through stage 4 chronic kidney disease, or unspecified chronic kidney disease: Secondary | ICD-10-CM | POA: Diagnosis not present

## 2017-10-08 DIAGNOSIS — N183 Chronic kidney disease, stage 3 (moderate): Secondary | ICD-10-CM | POA: Diagnosis not present

## 2017-10-08 DIAGNOSIS — Z510 Encounter for antineoplastic radiation therapy: Secondary | ICD-10-CM | POA: Diagnosis not present

## 2017-10-08 DIAGNOSIS — J449 Chronic obstructive pulmonary disease, unspecified: Secondary | ICD-10-CM | POA: Diagnosis not present

## 2017-10-08 DIAGNOSIS — Z7901 Long term (current) use of anticoagulants: Secondary | ICD-10-CM | POA: Diagnosis not present

## 2017-10-08 DIAGNOSIS — I5030 Unspecified diastolic (congestive) heart failure: Secondary | ICD-10-CM | POA: Diagnosis not present

## 2017-10-09 ENCOUNTER — Ambulatory Visit
Admission: RE | Admit: 2017-10-09 | Discharge: 2017-10-09 | Disposition: A | Discharge status: Home / Self Care | Payer: Federal, State, Local not specified - PPO | Source: Ambulatory Visit | Attending: Radiation Oncology | Admitting: Radiation Oncology

## 2017-10-09 DIAGNOSIS — Z510 Encounter for antineoplastic radiation therapy: Secondary | ICD-10-CM | POA: Diagnosis not present

## 2017-10-09 DIAGNOSIS — G4733 Obstructive sleep apnea (adult) (pediatric): Secondary | ICD-10-CM | POA: Diagnosis not present

## 2017-10-09 DIAGNOSIS — C61 Malignant neoplasm of prostate: Secondary | ICD-10-CM | POA: Diagnosis not present

## 2017-10-09 DIAGNOSIS — J449 Chronic obstructive pulmonary disease, unspecified: Secondary | ICD-10-CM | POA: Diagnosis not present

## 2017-10-09 DIAGNOSIS — Z7901 Long term (current) use of anticoagulants: Secondary | ICD-10-CM | POA: Diagnosis not present

## 2017-10-09 DIAGNOSIS — I481 Persistent atrial fibrillation: Secondary | ICD-10-CM | POA: Diagnosis not present

## 2017-10-09 DIAGNOSIS — I10 Essential (primary) hypertension: Secondary | ICD-10-CM | POA: Diagnosis not present

## 2017-10-11 ENCOUNTER — Ambulatory Visit
Admission: RE | Admit: 2017-10-11 | Discharge: 2017-10-11 | Disposition: A | Discharge status: Home / Self Care | Payer: Federal, State, Local not specified - PPO | Source: Ambulatory Visit | Attending: Radiation Oncology | Admitting: Radiation Oncology

## 2017-10-11 DIAGNOSIS — Z7901 Long term (current) use of anticoagulants: Secondary | ICD-10-CM | POA: Diagnosis not present

## 2017-10-11 DIAGNOSIS — G4733 Obstructive sleep apnea (adult) (pediatric): Secondary | ICD-10-CM | POA: Diagnosis not present

## 2017-10-11 DIAGNOSIS — Z510 Encounter for antineoplastic radiation therapy: Secondary | ICD-10-CM | POA: Diagnosis not present

## 2017-10-11 DIAGNOSIS — C61 Malignant neoplasm of prostate: Secondary | ICD-10-CM | POA: Diagnosis not present

## 2017-10-11 DIAGNOSIS — I10 Essential (primary) hypertension: Secondary | ICD-10-CM | POA: Diagnosis not present

## 2017-10-11 DIAGNOSIS — J449 Chronic obstructive pulmonary disease, unspecified: Secondary | ICD-10-CM | POA: Diagnosis not present

## 2017-10-11 DIAGNOSIS — I481 Persistent atrial fibrillation: Secondary | ICD-10-CM | POA: Diagnosis not present

## 2017-10-14 ENCOUNTER — Ambulatory Visit
Admission: RE | Admit: 2017-10-14 | Discharge: 2017-10-14 | Disposition: A | Discharge status: Home / Self Care | Payer: Federal, State, Local not specified - PPO | Source: Ambulatory Visit | Attending: Radiation Oncology | Admitting: Radiation Oncology

## 2017-10-14 DIAGNOSIS — G4733 Obstructive sleep apnea (adult) (pediatric): Secondary | ICD-10-CM | POA: Diagnosis not present

## 2017-10-14 DIAGNOSIS — I481 Persistent atrial fibrillation: Secondary | ICD-10-CM | POA: Diagnosis not present

## 2017-10-14 DIAGNOSIS — Z510 Encounter for antineoplastic radiation therapy: Secondary | ICD-10-CM | POA: Diagnosis not present

## 2017-10-14 DIAGNOSIS — J449 Chronic obstructive pulmonary disease, unspecified: Secondary | ICD-10-CM | POA: Diagnosis not present

## 2017-10-14 DIAGNOSIS — I10 Essential (primary) hypertension: Secondary | ICD-10-CM | POA: Diagnosis not present

## 2017-10-14 DIAGNOSIS — Z7901 Long term (current) use of anticoagulants: Secondary | ICD-10-CM | POA: Diagnosis not present

## 2017-10-14 DIAGNOSIS — C61 Malignant neoplasm of prostate: Secondary | ICD-10-CM | POA: Diagnosis not present

## 2017-10-15 ENCOUNTER — Ambulatory Visit
Admission: RE | Admit: 2017-10-15 | Discharge: 2017-10-15 | Disposition: A | Discharge status: Home / Self Care | Payer: Federal, State, Local not specified - PPO | Source: Ambulatory Visit | Attending: Radiation Oncology | Admitting: Radiation Oncology

## 2017-10-15 DIAGNOSIS — I10 Essential (primary) hypertension: Secondary | ICD-10-CM | POA: Diagnosis not present

## 2017-10-15 DIAGNOSIS — C61 Malignant neoplasm of prostate: Secondary | ICD-10-CM | POA: Diagnosis not present

## 2017-10-15 DIAGNOSIS — J449 Chronic obstructive pulmonary disease, unspecified: Secondary | ICD-10-CM | POA: Diagnosis not present

## 2017-10-15 DIAGNOSIS — Z7901 Long term (current) use of anticoagulants: Secondary | ICD-10-CM | POA: Diagnosis not present

## 2017-10-15 DIAGNOSIS — G4733 Obstructive sleep apnea (adult) (pediatric): Secondary | ICD-10-CM | POA: Diagnosis not present

## 2017-10-15 DIAGNOSIS — I481 Persistent atrial fibrillation: Secondary | ICD-10-CM | POA: Diagnosis not present

## 2017-10-15 DIAGNOSIS — Z510 Encounter for antineoplastic radiation therapy: Secondary | ICD-10-CM | POA: Diagnosis not present

## 2017-10-16 ENCOUNTER — Ambulatory Visit
Admission: RE | Admit: 2017-10-16 | Discharge: 2017-10-16 | Disposition: A | Discharge status: Home / Self Care | Payer: Federal, State, Local not specified - PPO | Source: Ambulatory Visit | Attending: Radiation Oncology | Admitting: Radiation Oncology

## 2017-10-16 DIAGNOSIS — I10 Essential (primary) hypertension: Secondary | ICD-10-CM | POA: Diagnosis not present

## 2017-10-16 DIAGNOSIS — Z510 Encounter for antineoplastic radiation therapy: Secondary | ICD-10-CM | POA: Diagnosis not present

## 2017-10-16 DIAGNOSIS — J449 Chronic obstructive pulmonary disease, unspecified: Secondary | ICD-10-CM | POA: Diagnosis not present

## 2017-10-16 DIAGNOSIS — Z7901 Long term (current) use of anticoagulants: Secondary | ICD-10-CM | POA: Diagnosis not present

## 2017-10-16 DIAGNOSIS — G4733 Obstructive sleep apnea (adult) (pediatric): Secondary | ICD-10-CM | POA: Diagnosis not present

## 2017-10-16 DIAGNOSIS — I481 Persistent atrial fibrillation: Secondary | ICD-10-CM | POA: Diagnosis not present

## 2017-10-16 DIAGNOSIS — C61 Malignant neoplasm of prostate: Secondary | ICD-10-CM | POA: Diagnosis not present

## 2017-10-17 ENCOUNTER — Other Ambulatory Visit: Payer: Self-pay | Admitting: Family Medicine

## 2017-10-17 ENCOUNTER — Ambulatory Visit
Admission: RE | Admit: 2017-10-17 | Discharge: 2017-10-17 | Disposition: A | Discharge status: Home / Self Care | Payer: Federal, State, Local not specified - PPO | Source: Ambulatory Visit | Attending: Radiation Oncology | Admitting: Radiation Oncology

## 2017-10-17 DIAGNOSIS — I10 Essential (primary) hypertension: Secondary | ICD-10-CM | POA: Diagnosis not present

## 2017-10-17 DIAGNOSIS — I481 Persistent atrial fibrillation: Secondary | ICD-10-CM | POA: Diagnosis not present

## 2017-10-17 DIAGNOSIS — C61 Malignant neoplasm of prostate: Secondary | ICD-10-CM | POA: Diagnosis not present

## 2017-10-17 DIAGNOSIS — G4733 Obstructive sleep apnea (adult) (pediatric): Secondary | ICD-10-CM | POA: Diagnosis not present

## 2017-10-17 DIAGNOSIS — Z7901 Long term (current) use of anticoagulants: Secondary | ICD-10-CM | POA: Diagnosis not present

## 2017-10-17 DIAGNOSIS — J449 Chronic obstructive pulmonary disease, unspecified: Secondary | ICD-10-CM | POA: Diagnosis not present

## 2017-10-17 DIAGNOSIS — Z510 Encounter for antineoplastic radiation therapy: Secondary | ICD-10-CM | POA: Diagnosis not present

## 2017-10-18 ENCOUNTER — Ambulatory Visit: Payer: Federal, State, Local not specified - PPO

## 2017-10-18 ENCOUNTER — Other Ambulatory Visit: Payer: Self-pay | Admitting: Cardiology

## 2017-10-18 DIAGNOSIS — M109 Gout, unspecified: Secondary | ICD-10-CM | POA: Diagnosis not present

## 2017-10-18 DIAGNOSIS — S92315A Nondisplaced fracture of first metatarsal bone, left foot, initial encounter for closed fracture: Secondary | ICD-10-CM | POA: Diagnosis not present

## 2017-10-20 NOTE — Telephone Encounter (Signed)
Refill OK

## 2017-10-21 ENCOUNTER — Ambulatory Visit
Admission: RE | Admit: 2017-10-21 | Discharge: 2017-10-21 | Disposition: A | Discharge status: Home / Self Care | Payer: Federal, State, Local not specified - PPO | Source: Ambulatory Visit | Attending: Radiation Oncology | Admitting: Radiation Oncology

## 2017-10-21 ENCOUNTER — Other Ambulatory Visit: Payer: Self-pay | Admitting: Family Medicine

## 2017-10-21 DIAGNOSIS — I10 Essential (primary) hypertension: Secondary | ICD-10-CM | POA: Diagnosis not present

## 2017-10-21 DIAGNOSIS — J449 Chronic obstructive pulmonary disease, unspecified: Secondary | ICD-10-CM | POA: Diagnosis not present

## 2017-10-21 DIAGNOSIS — Z510 Encounter for antineoplastic radiation therapy: Secondary | ICD-10-CM | POA: Diagnosis not present

## 2017-10-21 DIAGNOSIS — C61 Malignant neoplasm of prostate: Secondary | ICD-10-CM | POA: Diagnosis not present

## 2017-10-21 DIAGNOSIS — Z7901 Long term (current) use of anticoagulants: Secondary | ICD-10-CM | POA: Diagnosis not present

## 2017-10-21 DIAGNOSIS — G4733 Obstructive sleep apnea (adult) (pediatric): Secondary | ICD-10-CM | POA: Diagnosis not present

## 2017-10-21 DIAGNOSIS — I481 Persistent atrial fibrillation: Secondary | ICD-10-CM | POA: Diagnosis not present

## 2017-10-21 NOTE — Telephone Encounter (Signed)
Refills OK. 

## 2017-10-21 NOTE — Telephone Encounter (Signed)
Furosemide (Lasix) refill Last Refilled by a different provider Last OV: 10/01/17 PCP: Dr. Elease Hashimoto [facility]

## 2017-10-21 NOTE — Telephone Encounter (Signed)
Copied from [place] [phone]. Topic: Quick Communication - Rx Refill/Question >> Oct 21, 2017 11:50 AM Oliver Pila B wrote: Medication: furosemide (LASIX) 40 MG tablet [[id number]]   Has the patient contacted their pharmacy? Yes.   (Agent: If no, request that the patient contact the pharmacy for the refill.) (Agent: If yes, when and what did the pharmacy advise?)  [facility]erred [facility] ([facility] phone [facility]  Agent: Please be advised that RX refills may take up to 3 business days. We ask that you follow-up with your pharmacy.

## 2017-10-21 NOTE — Telephone Encounter (Signed)
Please advise 

## 2017-10-22 ENCOUNTER — Ambulatory Visit
Admission: RE | Admit: 2017-10-22 | Discharge: 2017-10-22 | Disposition: A | Discharge status: Home / Self Care | Payer: Federal, State, Local not specified - PPO | Source: Ambulatory Visit | Attending: Radiation Oncology | Admitting: Radiation Oncology

## 2017-10-22 DIAGNOSIS — C61 Malignant neoplasm of prostate: Secondary | ICD-10-CM | POA: Diagnosis not present

## 2017-10-22 DIAGNOSIS — I481 Persistent atrial fibrillation: Secondary | ICD-10-CM | POA: Diagnosis not present

## 2017-10-22 DIAGNOSIS — Z7901 Long term (current) use of anticoagulants: Secondary | ICD-10-CM | POA: Diagnosis not present

## 2017-10-22 DIAGNOSIS — G4733 Obstructive sleep apnea (adult) (pediatric): Secondary | ICD-10-CM | POA: Diagnosis not present

## 2017-10-22 DIAGNOSIS — I10 Essential (primary) hypertension: Secondary | ICD-10-CM | POA: Diagnosis not present

## 2017-10-22 DIAGNOSIS — Z510 Encounter for antineoplastic radiation therapy: Secondary | ICD-10-CM | POA: Diagnosis not present

## 2017-10-22 DIAGNOSIS — J449 Chronic obstructive pulmonary disease, unspecified: Secondary | ICD-10-CM | POA: Diagnosis not present

## 2017-10-22 MED ORDER — FUROSEMIDE 40 MG PO TABS: 40.0000 mg | ORAL_TABLET | Freq: Two times a day (BID) | ORAL | 1 refills | Status: DC

## 2017-10-23 ENCOUNTER — Ambulatory Visit
Admission: RE | Admit: 2017-10-23 | Discharge: 2017-10-23 | Disposition: A | Discharge status: Home / Self Care | Payer: Federal, State, Local not specified - PPO | Source: Ambulatory Visit | Attending: Radiation Oncology | Admitting: Radiation Oncology

## 2017-10-23 DIAGNOSIS — C61 Malignant neoplasm of prostate: Secondary | ICD-10-CM | POA: Diagnosis not present

## 2017-10-23 DIAGNOSIS — I10 Essential (primary) hypertension: Secondary | ICD-10-CM | POA: Diagnosis not present

## 2017-10-23 DIAGNOSIS — J449 Chronic obstructive pulmonary disease, unspecified: Secondary | ICD-10-CM | POA: Diagnosis not present

## 2017-10-23 DIAGNOSIS — Z510 Encounter for antineoplastic radiation therapy: Secondary | ICD-10-CM | POA: Diagnosis not present

## 2017-10-23 DIAGNOSIS — Z7901 Long term (current) use of anticoagulants: Secondary | ICD-10-CM | POA: Diagnosis not present

## 2017-10-23 DIAGNOSIS — G4733 Obstructive sleep apnea (adult) (pediatric): Secondary | ICD-10-CM | POA: Diagnosis not present

## 2017-10-23 DIAGNOSIS — I481 Persistent atrial fibrillation: Secondary | ICD-10-CM | POA: Diagnosis not present

## 2017-10-24 ENCOUNTER — Ambulatory Visit
Admission: RE | Admit: 2017-10-24 | Discharge: 2017-10-24 | Disposition: A | Discharge status: Home / Self Care | Payer: Federal, State, Local not specified - PPO | Source: Ambulatory Visit | Attending: Radiation Oncology | Admitting: Radiation Oncology

## 2017-10-24 DIAGNOSIS — I10 Essential (primary) hypertension: Secondary | ICD-10-CM | POA: Diagnosis not present

## 2017-10-24 DIAGNOSIS — C61 Malignant neoplasm of prostate: Secondary | ICD-10-CM | POA: Diagnosis not present

## 2017-10-24 DIAGNOSIS — J449 Chronic obstructive pulmonary disease, unspecified: Secondary | ICD-10-CM | POA: Diagnosis not present

## 2017-10-24 DIAGNOSIS — I481 Persistent atrial fibrillation: Secondary | ICD-10-CM | POA: Diagnosis not present

## 2017-10-24 DIAGNOSIS — Z7901 Long term (current) use of anticoagulants: Secondary | ICD-10-CM | POA: Diagnosis not present

## 2017-10-24 DIAGNOSIS — G4733 Obstructive sleep apnea (adult) (pediatric): Secondary | ICD-10-CM | POA: Diagnosis not present

## 2017-10-24 DIAGNOSIS — Z510 Encounter for antineoplastic radiation therapy: Secondary | ICD-10-CM | POA: Diagnosis not present

## 2017-10-25 ENCOUNTER — Ambulatory Visit
Admission: RE | Admit: 2017-10-25 | Discharge: 2017-10-25 | Disposition: A | Discharge status: Home / Self Care | Payer: Federal, State, Local not specified - PPO | Source: Ambulatory Visit | Attending: Radiation Oncology | Admitting: Radiation Oncology

## 2017-10-25 DIAGNOSIS — Z7901 Long term (current) use of anticoagulants: Secondary | ICD-10-CM | POA: Diagnosis not present

## 2017-10-25 DIAGNOSIS — G4733 Obstructive sleep apnea (adult) (pediatric): Secondary | ICD-10-CM | POA: Diagnosis not present

## 2017-10-25 DIAGNOSIS — I10 Essential (primary) hypertension: Secondary | ICD-10-CM | POA: Diagnosis not present

## 2017-10-25 DIAGNOSIS — Z510 Encounter for antineoplastic radiation therapy: Secondary | ICD-10-CM | POA: Diagnosis not present

## 2017-10-25 DIAGNOSIS — I481 Persistent atrial fibrillation: Secondary | ICD-10-CM | POA: Diagnosis not present

## 2017-10-25 DIAGNOSIS — C61 Malignant neoplasm of prostate: Secondary | ICD-10-CM | POA: Diagnosis not present

## 2017-10-25 DIAGNOSIS — J449 Chronic obstructive pulmonary disease, unspecified: Secondary | ICD-10-CM | POA: Diagnosis not present

## 2017-10-28 ENCOUNTER — Ambulatory Visit
Admission: RE | Admit: 2017-10-28 | Discharge: 2017-10-28 | Disposition: A | Discharge status: Home / Self Care | Payer: Federal, State, Local not specified - PPO | Source: Ambulatory Visit | Attending: Radiation Oncology | Admitting: Radiation Oncology

## 2017-10-28 DIAGNOSIS — G4733 Obstructive sleep apnea (adult) (pediatric): Secondary | ICD-10-CM | POA: Diagnosis not present

## 2017-10-28 DIAGNOSIS — I481 Persistent atrial fibrillation: Secondary | ICD-10-CM | POA: Diagnosis not present

## 2017-10-28 DIAGNOSIS — Z7901 Long term (current) use of anticoagulants: Secondary | ICD-10-CM | POA: Diagnosis not present

## 2017-10-28 DIAGNOSIS — C61 Malignant neoplasm of prostate: Secondary | ICD-10-CM | POA: Diagnosis not present

## 2017-10-28 DIAGNOSIS — Z510 Encounter for antineoplastic radiation therapy: Secondary | ICD-10-CM | POA: Diagnosis not present

## 2017-10-28 DIAGNOSIS — I10 Essential (primary) hypertension: Secondary | ICD-10-CM | POA: Diagnosis not present

## 2017-10-28 DIAGNOSIS — J449 Chronic obstructive pulmonary disease, unspecified: Secondary | ICD-10-CM | POA: Diagnosis not present

## 2017-10-29 ENCOUNTER — Ambulatory Visit
Admission: RE | Admit: 2017-10-29 | Discharge: 2017-10-29 | Disposition: A | Discharge status: Home / Self Care | Payer: Federal, State, Local not specified - PPO | Source: Ambulatory Visit | Attending: Radiation Oncology | Admitting: Radiation Oncology

## 2017-10-29 DIAGNOSIS — I10 Essential (primary) hypertension: Secondary | ICD-10-CM | POA: Diagnosis not present

## 2017-10-29 DIAGNOSIS — I481 Persistent atrial fibrillation: Secondary | ICD-10-CM | POA: Diagnosis not present

## 2017-10-29 DIAGNOSIS — Z7901 Long term (current) use of anticoagulants: Secondary | ICD-10-CM | POA: Diagnosis not present

## 2017-10-29 DIAGNOSIS — G4733 Obstructive sleep apnea (adult) (pediatric): Secondary | ICD-10-CM | POA: Diagnosis not present

## 2017-10-29 DIAGNOSIS — J449 Chronic obstructive pulmonary disease, unspecified: Secondary | ICD-10-CM | POA: Diagnosis not present

## 2017-10-29 DIAGNOSIS — C61 Malignant neoplasm of prostate: Secondary | ICD-10-CM | POA: Diagnosis not present

## 2017-10-29 DIAGNOSIS — Z510 Encounter for antineoplastic radiation therapy: Secondary | ICD-10-CM | POA: Diagnosis not present

## 2017-10-30 ENCOUNTER — Ambulatory Visit
Admission: RE | Admit: 2017-10-30 | Discharge: 2017-10-30 | Disposition: A | Discharge status: Home / Self Care | Payer: Federal, State, Local not specified - PPO | Source: Ambulatory Visit | Attending: Radiation Oncology | Admitting: Radiation Oncology

## 2017-10-30 DIAGNOSIS — G4733 Obstructive sleep apnea (adult) (pediatric): Secondary | ICD-10-CM | POA: Diagnosis not present

## 2017-10-30 DIAGNOSIS — Z510 Encounter for antineoplastic radiation therapy: Secondary | ICD-10-CM | POA: Diagnosis not present

## 2017-10-30 DIAGNOSIS — J449 Chronic obstructive pulmonary disease, unspecified: Secondary | ICD-10-CM | POA: Diagnosis not present

## 2017-10-30 DIAGNOSIS — I481 Persistent atrial fibrillation: Secondary | ICD-10-CM | POA: Diagnosis not present

## 2017-10-30 DIAGNOSIS — Z7901 Long term (current) use of anticoagulants: Secondary | ICD-10-CM | POA: Diagnosis not present

## 2017-10-30 DIAGNOSIS — C61 Malignant neoplasm of prostate: Secondary | ICD-10-CM | POA: Diagnosis not present

## 2017-10-30 DIAGNOSIS — I10 Essential (primary) hypertension: Secondary | ICD-10-CM | POA: Diagnosis not present

## 2017-10-31 ENCOUNTER — Ambulatory Visit
Admission: RE | Admit: 2017-10-31 | Discharge: 2017-10-31 | Disposition: A | Discharge status: Home / Self Care | Payer: Federal, State, Local not specified - PPO | Source: Ambulatory Visit | Attending: Radiation Oncology | Admitting: Radiation Oncology

## 2017-10-31 ENCOUNTER — Ambulatory Visit: Payer: Federal, State, Local not specified - PPO

## 2017-10-31 DIAGNOSIS — I481 Persistent atrial fibrillation: Secondary | ICD-10-CM | POA: Diagnosis not present

## 2017-10-31 DIAGNOSIS — C61 Malignant neoplasm of prostate: Secondary | ICD-10-CM | POA: Diagnosis not present

## 2017-10-31 DIAGNOSIS — Z7901 Long term (current) use of anticoagulants: Secondary | ICD-10-CM | POA: Diagnosis not present

## 2017-10-31 DIAGNOSIS — G4733 Obstructive sleep apnea (adult) (pediatric): Secondary | ICD-10-CM | POA: Diagnosis not present

## 2017-10-31 DIAGNOSIS — J449 Chronic obstructive pulmonary disease, unspecified: Secondary | ICD-10-CM | POA: Diagnosis not present

## 2017-10-31 DIAGNOSIS — I10 Essential (primary) hypertension: Secondary | ICD-10-CM | POA: Diagnosis not present

## 2017-10-31 DIAGNOSIS — Z510 Encounter for antineoplastic radiation therapy: Secondary | ICD-10-CM | POA: Diagnosis not present

## 2017-11-01 ENCOUNTER — Ambulatory Visit
Admission: RE | Admit: 2017-11-01 | Discharge: 2017-11-01 | Disposition: A | Discharge status: Home / Self Care | Payer: Federal, State, Local not specified - PPO | Source: Ambulatory Visit | Attending: Radiation Oncology | Admitting: Radiation Oncology

## 2017-11-01 ENCOUNTER — Encounter: Payer: Self-pay | Admitting: Medical Oncology

## 2017-11-01 DIAGNOSIS — Z510 Encounter for antineoplastic radiation therapy: Secondary | ICD-10-CM | POA: Diagnosis not present

## 2017-11-01 DIAGNOSIS — C61 Malignant neoplasm of prostate: Secondary | ICD-10-CM | POA: Diagnosis not present

## 2017-11-01 DIAGNOSIS — I481 Persistent atrial fibrillation: Secondary | ICD-10-CM | POA: Diagnosis not present

## 2017-11-01 DIAGNOSIS — I10 Essential (primary) hypertension: Secondary | ICD-10-CM | POA: Diagnosis not present

## 2017-11-01 DIAGNOSIS — J449 Chronic obstructive pulmonary disease, unspecified: Secondary | ICD-10-CM | POA: Diagnosis not present

## 2017-11-01 DIAGNOSIS — Z7901 Long term (current) use of anticoagulants: Secondary | ICD-10-CM | POA: Diagnosis not present

## 2017-11-01 DIAGNOSIS — G4733 Obstructive sleep apnea (adult) (pediatric): Secondary | ICD-10-CM | POA: Diagnosis not present

## 2017-11-01 NOTE — Progress Notes (Signed)
Celebrated with patient and his wife as he rang the bell after completing radiation. He states he tolerated treatment well. He has follow up 8/28 with Ashlyn. I asked him to call with questions or concerns.He voiced understanding and voiced his appreciation for the great care he has received.

## 2017-11-06 ENCOUNTER — Encounter: Payer: Self-pay | Admitting: Radiation Oncology

## 2017-11-06 NOTE — Progress Notes (Signed)
  Radiation Oncology         [phone] ________________________________  Name: Ronald [name] MRN: [id number]  Date: 11/06/2017  DOB: 06-15-1945  End of Treatment Note  Diagnosis:  Stage T1b adenocarcinoma of the prostate with a Gleason's score of 3+4 and a PSA of 12.80     Indication for treatment:  Curative, Definitive Radiotherapy       Radiation treatment dates:   09/23/17 - 11/01/17  Site/dose:   The prostate was treated to 70 Gy in 28 fractions of 2.5 Gy  Beams/energy:   The patient was treated with IMRT using volumetric arc therapy delivering 6 MV X-rays to clockwise and counterclockwise circumferential arcs with a 90 degree collimator offset to avoid dose scalloping.  Image guidance was performed with daily cone beam CT prior to each fraction to align to gold markers in the prostate and assure proper bladder and rectal fill volumes.  Immobilization was achieved with Body[employer] custom mold.  Narrative: The patient tolerated radiation treatment relatively well. Towards the end of treatment, he reported experiencing moderate fatigue, some urinary urgency, and intermittent diarrhea. He denied pain, hematuria, dysuria, and any other symptoms throughout treatment.  Plan: The patient has completed radiation treatment. He will return to radiation oncology clinic for routine followup in one month. I advised him to call or return sooner if he has any questions or concerns related to his recovery or treatment. ________________________________  Sheral Apley. Tammi Klippel, M.D.  This document serves as a record of services personally performed by Tyler Pita, MD. It was created on his behalf by Wilburn Mylar, a trained medical scribe. The creation of this record is based on the scribe's personal observations and the provider's statements to them. This document has been checked and approved by the attending provider.

## 2017-12-04 ENCOUNTER — Encounter: Payer: Self-pay | Admitting: Urology

## 2017-12-04 ENCOUNTER — Ambulatory Visit
Admission: RE | Admit: 2017-12-04 | Discharge: 2017-12-04 | Disposition: A | Discharge status: Home / Self Care | Payer: Federal, State, Local not specified - PPO | Source: Ambulatory Visit | Attending: Urology | Admitting: Urology

## 2017-12-04 ENCOUNTER — Other Ambulatory Visit: Payer: Self-pay

## 2017-12-04 VITALS — BP 138/84 | HR 75 | Temp 98.5°F | Resp 20 | Ht 71.0 in | Wt 238.4 lb

## 2017-12-04 DIAGNOSIS — Z7984 Long term (current) use of oral hypoglycemic drugs: Secondary | ICD-10-CM | POA: Insufficient documentation

## 2017-12-04 DIAGNOSIS — C61 Malignant neoplasm of prostate: Secondary | ICD-10-CM | POA: Diagnosis not present

## 2017-12-04 DIAGNOSIS — Z7901 Long term (current) use of anticoagulants: Secondary | ICD-10-CM | POA: Diagnosis not present

## 2017-12-04 DIAGNOSIS — Z923 Personal history of irradiation: Secondary | ICD-10-CM | POA: Diagnosis not present

## 2017-12-04 DIAGNOSIS — Z79899 Other long term (current) drug therapy: Secondary | ICD-10-CM | POA: Diagnosis not present

## 2017-12-04 NOTE — Progress Notes (Signed)
Radiation Oncology         [phone] ________________________________  Name: Ronald [name] MRN: [id number]  Date: 12/04/2017  DOB: March 08, 1946  Post Treatment Note  CC: [name], MD  [name], MD  Diagnosis:   Stage T1badenocarcinoma of the prostate with a Gleason's score of 3+4and a PSA of 12.80     Interval Since Last Radiation:  4 weeks  09/23/17 - 11/01/17:  The prostate was treated to 70 Gy in 28 fractions of 2.5 Gy  Narrative:  The patient returns today for routine follow-up.  He tolerated radiation treatment relatively well. Towards the end of treatment, he reported experiencing moderate fatigue, some urinary urgency, and intermittent diarrhea. He denied pain, hematuria, dysuria, and any other symptoms throughout treatment.                             On review of systems, the patient states that he is doing very well and is currently without complaints.  His current IPSS score is 9 indicating mild urinary symptoms only.  He specifically denies dysuria, gross hematuria, excessive daytime frequency, weak stream, incomplete emptying or incontinence.  He continues with mild increased frequency and urgency as well as nocturia 1-2 times per night but is overall pleased with his progress to date.  He reports a healthy appetite and is maintaining his weight.  He denies abdominal pain, nausea, vomiting or diarrhea.  He reports his bowel habits have returned to normal at this point.  He has a planned follow-up visit with [name] on December 17, 2017.   ALLERGIES:  has No Known Allergies.  Meds: Current Outpatient Medications  Medication Sig Dispense Refill  . acetaminophen (TYLENOL) 500 MG tablet Take 500 mg by mouth every 6 (six) hours as needed for mild pain or moderate pain.    Marland Kitchen ALPRAZolam (XANAX) 0.25 MG tablet Take 1 tablet (0.25 mg total) at bedtime as needed by mouth for anxiety or sleep. 15 tablet 0  . amiodarone (PACERONE) 200 MG tablet Take 1 tablet  (200 mg total) by mouth daily. (Patient taking differently: Take 200 mg by mouth every morning. ) 90 tablet 3  . apixaban (ELIQUIS) 5 MG TABS tablet Take 1 tablet (5 mg total) by mouth 2 (two) times daily. 60 tablet 0  . atorvastatin (LIPITOR) 10 MG tablet Take 1 tablet (10 mg total) by mouth every evening. 90 tablet 0  . azelastine (ASTELIN) 0.1 % nasal spray Place 1 spray into both nostrils 2 (two) times daily. Use in each nostril as directed 30 mL 12  . carvedilol (COREG) 12.5 MG tablet Take 1 tablet (12.5 mg total) by mouth 2 (two) times daily. 180 tablet 3  . colchicine 0.6 MG tablet Two tabs at onset of gout flare and then one tab twice daily as needed (Patient taking differently: as needed. Two tabs at onset of gout flare and then one tab twice daily as needed) 30 tablet 2  . ELIQUIS 5 MG TABS tablet TAKE 1 TABLET TWICE A DAY 180 tablet 1  . furosemide (LASIX) 40 MG tablet TAKE 1-2 TABLETS DAILY AS NEEDED. 180 tablet 1  . furosemide (LASIX) 40 MG tablet Take 1 tablet (40 mg total) by mouth 2 (two) times daily. Take 1 tablet in the morning and second tablet at 2pm daily. 60 tablet 1  . glipiZIDE (GLUCOTROL) 5 MG tablet TAKE 1/2 TABLETS BY MOUTH TWICE DAILY 180 tablet 1  .  glucose blood (ACCU-CHEK AVIVA PLUS) test strip Test once daily dx e11.9 100 each 12  . hydrocortisone (ANUSOL-HC) 2.5 % rectal cream Apply 2 (two) times daily topically. (Patient taking differently: Apply topically 2 (two) times daily as needed. ) 30 g 0  . KLOR-CON M20 20 MEQ tablet TAKE 2 TABLETS BY MOUTH EVERY DAY 180 tablet 1  . Lancets (ACCU-CHEK SOFT TOUCH) lancets Check blood sugars once per day. DX: E11.9 100 each 2  . losartan (COZAAR) 25 MG tablet Take 1 tablet (25 mg total) by mouth daily. (Patient taking differently: Take 25 mg by mouth every evening. ) 90 tablet 3  . metoprolol tartrate (LOPRESSOR) 25 MG tablet Take  1 tablet if  you have breakthrough atrial fib with extra dose of 200 mg Amiodarone. (Patient  taking differently: Take 25 mg by mouth as directed. Take  1 tablet if  you have breakthrough atrial fib with extra dose of 200 mg Amiodarone.) 10 tablet 6  . montelukast (SINGULAIR) 10 MG tablet TAKE 1 TABLET BY MOUTH EVERYDAY AT BEDTIME 90 tablet 1   No current facility-administered medications for this encounter.     Physical Findings:  vitals were not taken for this visit.   /10 In general this is a well appearing Caucasian male in no acute distress.  He's alert and oriented x4 and appropriate throughout the examination. Cardiopulmonary assessment is negative for acute distress and he exhibits normal effort.   Lab Findings: Lab Results  Component Value Date   WBC 6.7 07/25/2017   HGB 14.6 09/09/2017   HCT 43.0 09/09/2017   MCV 97.7 07/25/2017   PLT 202 07/25/2017     Radiographic Findings: No results found.  Impression/Plan: 1. Stage T1badenocarcinoma of the prostate with a Gleason's score of 3+4and a PSA of 12.80. He will continue to follow up with urology for ongoing PSA determinations and has an appointment scheduled with [name] on December 17, 2017. He understands what to expect with regards to PSA monitoring going forward. I will look forward to following his response to treatment via correspondence with urology, and would be happy to continue to participate in his care if clinically indicated. I talked to the patient about what to expect in the future, including his risk for erectile dysfunction and rectal bleeding. I encouraged him to call or return to the office if he has any questions regarding his previous radiation or possible radiation side effects. He was comfortable with this plan and will follow up as needed.       Nicholos Johns, PA-C

## 2017-12-16 DIAGNOSIS — N401 Enlarged prostate with lower urinary tract symptoms: Secondary | ICD-10-CM | POA: Diagnosis not present

## 2017-12-16 DIAGNOSIS — R351 Nocturia: Secondary | ICD-10-CM | POA: Diagnosis not present

## 2017-12-16 DIAGNOSIS — C61 Malignant neoplasm of prostate: Secondary | ICD-10-CM | POA: Diagnosis not present

## 2017-12-17 ENCOUNTER — Ambulatory Visit (HOSPITAL_COMMUNITY)
Admission: RE | Admit: 2017-12-17 | Discharge: 2017-12-17 | Disposition: A | Discharge status: Home / Self Care | Payer: Federal, State, Local not specified - PPO | Source: Ambulatory Visit | Attending: Cardiology | Admitting: Cardiology

## 2017-12-17 DIAGNOSIS — Z79899 Other long term (current) drug therapy: Secondary | ICD-10-CM | POA: Insufficient documentation

## 2017-12-17 DIAGNOSIS — I480 Paroxysmal atrial fibrillation: Secondary | ICD-10-CM

## 2017-12-17 LAB — PULMONARY FUNCTION TEST
DL/VA % PRED: 57 %
DL/VA: 2.66 ml/min/mmHg/L
DLCO unc % pred: 46 %
DLCO unc: 15.8 ml/min/mmHg
FEF 25-75 PRE: 2.89 L/s
FEF2575-%Pred-Pre: 118 %
FEV1-%Pred-Pre: 95 %
FEV1-Pre: 3.14 L
FEV1FVC-%Pred-Pre: 106 %
FEV6-%PRED-PRE: 92 %
FEV6-Pre: 3.91 L
FEV6FVC-%PRED-PRE: 102 %
FVC-%Pred-Pre: 89 %
FVC-Pre: 4.05 L
PRE FEV1/FVC RATIO: 78 %
Pre FEV6/FVC Ratio: 97 %
RV % PRED: 74 %
RV: 1.89 L
TLC % pred: 84 %
TLC: 6.15 L

## 2017-12-19 ENCOUNTER — Telehealth: Payer: Self-pay | Admitting: *Deleted

## 2017-12-19 ENCOUNTER — Other Ambulatory Visit: Payer: Self-pay

## 2017-12-19 DIAGNOSIS — I480 Paroxysmal atrial fibrillation: Secondary | ICD-10-CM

## 2017-12-19 DIAGNOSIS — Z79899 Other long term (current) drug therapy: Secondary | ICD-10-CM

## 2017-12-19 DIAGNOSIS — I10 Essential (primary) hypertension: Secondary | ICD-10-CM | POA: Diagnosis not present

## 2017-12-19 DIAGNOSIS — I429 Cardiomyopathy, unspecified: Secondary | ICD-10-CM

## 2017-12-19 DIAGNOSIS — I43 Cardiomyopathy in diseases classified elsewhere: Secondary | ICD-10-CM

## 2017-12-19 DIAGNOSIS — I4819 Other persistent atrial fibrillation: Secondary | ICD-10-CM

## 2017-12-19 NOTE — Telephone Encounter (Signed)
-----  Message from Leonie Man, MD sent at 12/19/2017 12:13 AM EDT -----  Pulmonary Function test results:: # Minimal Obstructive Airways Disease (COPD)--> we can consider a trial of bronchodilators to assess reversible airway disease.  # Severe Diffusion Defect -there is suggestion of reduced functional lung air sac surface (alveolar capillary surface).  It is been sure that this is not related to amiodarone, we need to check for signs of inflammation.  --We will check some blood work to make sure there are no signs of inflammation (Pls order CRP and ESR).  Glenetta Hew, MD

## 2017-12-19 NOTE — Progress Notes (Signed)
crp

## 2017-12-19 NOTE — Telephone Encounter (Signed)
SPOKE TO PATIENT. AWARE OF RESULTS  , WILL DO LABS TODAY.

## 2017-12-20 LAB — C-REACTIVE PROTEIN: CRP: 3 mg/L (ref 0–10)

## 2017-12-20 LAB — SEDIMENTATION RATE: SED RATE: 4 mm/h (ref 0–30)

## 2017-12-23 ENCOUNTER — Encounter: Payer: Self-pay | Admitting: Cardiology

## 2017-12-23 ENCOUNTER — Ambulatory Visit (INDEPENDENT_AMBULATORY_CARE_PROVIDER_SITE_OTHER): Payer: Federal, State, Local not specified - PPO | Admitting: Cardiology

## 2017-12-23 VITALS — BP 134/82 | HR 66 | Ht 71.0 in | Wt 236.6 lb

## 2017-12-23 DIAGNOSIS — Z79899 Other long term (current) drug therapy: Secondary | ICD-10-CM

## 2017-12-23 DIAGNOSIS — I10 Essential (primary) hypertension: Secondary | ICD-10-CM

## 2017-12-23 DIAGNOSIS — I480 Paroxysmal atrial fibrillation: Secondary | ICD-10-CM

## 2017-12-23 DIAGNOSIS — R0600 Dyspnea, unspecified: Secondary | ICD-10-CM

## 2017-12-23 DIAGNOSIS — I429 Cardiomyopathy, unspecified: Secondary | ICD-10-CM

## 2017-12-23 DIAGNOSIS — R0609 Other forms of dyspnea: Secondary | ICD-10-CM

## 2017-12-23 DIAGNOSIS — I43 Cardiomyopathy in diseases classified elsewhere: Secondary | ICD-10-CM

## 2017-12-23 DIAGNOSIS — Z720 Tobacco use: Secondary | ICD-10-CM

## 2017-12-23 NOTE — Assessment & Plan Note (Signed)
PFTs checked - DLCO reduced - CRP & ESR normal. This argues against Amio toxicity. Recommend f/u with Dr. Halford Chessman to discuss possible additional testing (? Chest CT).

## 2017-12-23 NOTE — Patient Instructions (Addendum)
NO MEDICATION CHANGES    You have been referred to Dr Halford Chessman. Keep your recall appointment with him for Nov 2019.     Your physician wants you to follow-up in [place].You will receive a reminder letter in the mail two months in advance. If you don't receive a letter, please call our office to schedule the follow-up appointment.   If you need a refill on your cardiac medications before your next appointment, please call your pharmacy.

## 2017-12-23 NOTE — Progress Notes (Signed)
PCP: [name], MD  Clinic Note: Chief Complaint  Patient presents with  . Follow-up    pt reports no new complaints. here to f/u test results.  . Atrial Fibrillation    no recurrence.  On Amiodarone (f/u PFT results)    HPI: Ronald [name] is a 72 y.o. male with a PMH notable for PAF (that has been persistent) who presents today for ER f/u - presented on 4/18 to ER with Afib - converted with IV BB.   He has a history of persistent atrial fibrillation on amiodarone with recent "stress-induced CM" during a hospitalization with sepsis.  November 12-19th 2018: Admitted for sepsis - complicated acute combined heart failure with reduced ejection fraction on echo. -- was completely swollen. -- RESOLVED Sx & EF back to baseline by Jan 2019:   F/u Echo in jan 2019 - EF 55- 60%mildly dilated aortic root(38 mm) and ascending aorta (44 mm). Compared to prior echo, LVEF has improved.     KAYMON DENOMME was last seen on April 23 --> was doing fairly well at that time using PRN Metoprolol for rapid Afib. Was happy with resolved Cardiomyopathy - no CHF Sx.  1 breakthrough A. fib on 07/26/17 - noted on Kartia monitor. A. fib RVR with rates in the 110-120 bpm.  He was given 2 doses of IV Lopressor that converted into sinus rhythm  Recent Hospitalizations:   ER 4/26 - PAF, converted with Amio  Studies Personally Reviewed - (if available, images/films reviewed: From Epic Chart or Care Everywhere)  PFT results reviewed.  Decreased DLCO compared to last check. -->  ESR and CRP checked, normal  Interval History: Ronald [name] returns today stating that he is finally starting to recover from undergoing radiation therapy for his prostate cancer.  He is finally now starting to get more active, but notes that he lost a lot of ground.  He gained weight because of not wanting to do things and has lost a lot of energy.  He noted one time that his heart rate went up when he was really fatigued from radiation but  it only lasted a few minutes before he could even do anything as far as rate control such as taking his as needed metoprolol. He still has edema, but he says it is long as he takes his morning dose of Lasix, he only has to take his additional dose 2 to 3 days out of the week but not more.  He has no PND orthopnea.  Besides that one episode he really has not noticed any rapid irregular heartbeats to suggest recurrence of A. fib RVR.  He is not the most aware of when he is in A. fib, but he usually does feel bad if it stays for a while.  If is only a few minutes he will notice it.  No resting or exertional chest tightness/pressure.  He does exercise and notes that he is able to get his heart rate up to about 100 110 beats a minute when he is walking. He denies any coughing or wheezing beyond his baseline smoker's cough and dyspnea.  He denies any syncope/near syncope or TIA/amaurosis fugax.  No melena, hematochezia, hematuria or epistaxis.  No myalgias.  ROS: A comprehensive was performed. Review of Systems  Constitutional: Negative for malaise/fatigue and weight loss (- Gained weight over holidays).  HENT: Negative for congestion (Pollen related stuffy nose) and sinus pain.   Eyes: Negative for blurred vision.  Respiratory: Positive for  cough (Allergy and smoking related.). Negative for sputum production, shortness of breath and wheezing.   Cardiovascular: Negative for palpitations and leg swelling (Controlled).  Gastrointestinal: Negative for heartburn.  Musculoskeletal: Positive for joint pain.  Neurological: Negative for dizziness.  Endo/Heme/Allergies: Positive for environmental allergies (Mostly pollen). Bruises/bleeds easily.  Psychiatric/Behavioral: Negative.   All other systems reviewed and are negative.  I have reviewed and (if needed) personally updated the patient's problem list, medications, allergies, past medical and surgical history, social and family history.   Past Medical  History:  Diagnosis Date  . Allergic rhinitis   . Anticoagulant long-term use    eliquis  . Cardiomyopathy due to systemic disease [facility])    followed by dr   . COPD with emphysema [facility])    pulmologist-  dr Halford Chessman  . Dyspnea    occasional per pt  . History of colon polyps    tubular adenoma 2013  . History of gout    09-05-2017 last flare-up  05/ 2019 3 wks ago, feet  . History of sepsis 02/18/2017   per d/c note probable uti, acute chf, acute renal failure, hypoxia  . Hyperlipidemia   . Hyperplasia of prostate with lower urinary tract symptoms (LUTS)   . Hypertension   . OSA on CPAP    per study 08-03-2004  Severe OSA  . Persistent atrial fibrillation [facility])    cardiologist --  dr Dorris Carnes--  post cardioversion 05-07-2014  . Prostate cancer [facility]) urologsit-  dr Alyson Ingles--- as of 05-21-2017 per pt last PSA 11 approx.   Dx  2014--  stage T1c, Gleason 3+3=6, PSA 6.67--  Active surveillance/  04/ 2019  Stage T1b, Gleason 3+4, PSA 12.8- plan external radiation therapy    . 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 ([place])    pulmologist-  dr Halford Chessman  . Seasonal allergies   . Sigmoid diverticulosis   . Systolic and diastolic CHF, chronic [facility])    cardiologist-  [name]  . Tinea versicolor   . Type 2 diabetes mellitus ([place])   . Wears hearing aid in both ears     Past Surgical History:  Procedure Laterality Date  . CARDIOVERSION N/A 05/07/2014   Procedure: CARDIOVERSION;  Surgeon: [name], MD;  Location: [facility] ENDOSCOPY;  Service: Cardiovascular;  Laterality: N/A;  . CARDIOVERSION N/A 09/09/2014   Procedure: CARDIOVERSION;  Surgeon: Lelon Perla, MD;  Location: [facility] ENDOSCOPY;  Service: Cardiovascular;  Laterality: N/A;  successfully  . CATARACT EXTRACTION W/ INTRAOCULAR LENS  IMPLANT, BILATERAL  08 and 09/  2018  . COLONOSCOPY  last one 06-07-2011  . GOLD SEED IMPLANT N/A 09/09/2017   Procedure: GOLD SEED IMPLANT;  Surgeon: [name], MD;   Location: [facility];  Service: Urology;  Laterality: N/A;  . HYDROCELE EXCISION Bilateral 09/26/2015   Procedure: HYDROCELECTOMY ADULT;  Surgeon: [name], MD;  Location: [facility];  Service: Urology;  Laterality: Bilateral;  . LAMINECTOMY AND MICRODISCECTOMY LUMBAR SPINE  12-23-2003   Left L5 -- S1  . LAPAROSCOPIC BILATERAL INGUINAL HERNIA REPAIR/  UMBILICAL HERNIA REPAIR WITH MESH/  ASPIRATION LEFT HYDROCELE  07-11-2012  . NM MYOVIEW LTD  05/18/2014   Low risk study. Normal perfusion: No ischemia or infarction. Mild LV dysfunction - 46% (does not correlate with echocardiographic EF of 50-55%)  . PROSTATE BIOPSY  05/15/12   Clinically both [place]  . SPACE OAR INSTILLATION N/A 09/09/2017   Procedure: SPACE OAR INSTILLATION;  Surgeon: [name],  MD;  Location: [place];  Service: Urology;  Laterality: N/A;  . TEE WITHOUT CARDIOVERSION N/A 05/07/2014   Procedure: TRANSESOPHAGEAL ECHOCARDIOGRAM (TEE);  Surgeon: [name], MD;  Location: [facility] ENDOSCOPY;  Service: Cardiovascular;  Laterality: N/A;   mild atherosclerosis plaque of aorta,  mild AR, MR, and TR,  no cardiac source of emboli  . TONSILLECTOMY  as child  . TRANSTHORACIC ECHOCARDIOGRAM  11/'18; 1/'19   a) In setting of sepsis: EF of 40-45%.  Diffuse hypokinesis.  No RWMA.  Biatrial enlargement.;; b) ** f/u Jan 2019: Normal LVF 55-60%** , mildly dilated aortic root(38 mm) and ascending aorta (44 mm). Compared to prior echo, LVEF has improved.  . TRANSURETHRAL RESECTION OF PROSTATE N/A 05/30/2017   Procedure: TRANSURETHRAL RESECTION OF THE PROSTATE (TURP);  Surgeon: [name], MD;  Location: [facility];  Service: Urology;  Laterality: N/A;    Current Meds  Medication Sig  . acetaminophen (TYLENOL) 500 MG tablet Take 500 mg by mouth every 6 (six) hours as needed for mild pain or moderate pain.  Marland Kitchen albuterol (VENTOLIN HFA) 108 (90 Base) MCG/ACT inhaler Ventolin HFA 90  mcg/actuation aerosol inhaler  . ALPRAZolam (XANAX) 0.25 MG tablet Take 1 tablet (0.25 mg total) at bedtime as needed by mouth for anxiety or sleep.  Marland Kitchen amiodarone (PACERONE) 200 MG tablet Take 1 tablet (200 mg total) by mouth daily.  Marland Kitchen apixaban (ELIQUIS) 5 MG TABS tablet Take 1 tablet (5 mg total) by mouth 2 (two) times daily.  Marland Kitchen atorvastatin (LIPITOR) 10 MG tablet Take 1 tablet (10 mg total) by mouth every evening.  Marland Kitchen azelastine (ASTELIN) 0.1 % nasal spray Place 1 spray into both nostrils 2 (two) times daily. Use in each nostril as directed  . colchicine 0.6 MG tablet Two tabs at onset of gout flare and then one tab twice daily as needed (Patient taking differently: as needed. Two tabs at onset of gout flare and then one tab twice daily as needed)  . furosemide (LASIX) 40 MG tablet Take 1 tablet (40 mg total) by mouth 2 (two) times daily. Take 1 tablet in the morning and second tablet at 2pm daily.  Marland Kitchen glipiZIDE (GLUCOTROL) 5 MG tablet TAKE 1/2 TABLETS BY MOUTH TWICE DAILY  . glucose blood (ACCU-CHEK AVIVA PLUS) test strip Test once daily dx e11.9  . hydrocortisone (ANUSOL-HC) 2.5 % rectal cream Apply 2 (two) times daily topically. (Patient taking differently: Apply topically 2 (two) times daily as needed. )  . KLOR-CON M20 20 MEQ tablet TAKE 2 TABLETS BY MOUTH EVERY DAY  . Lancets (ACCU-CHEK SOFT TOUCH) lancets Check blood sugars once per day. DX: E11.9  . metoprolol tartrate (LOPRESSOR) 25 MG tablet Take  1 tablet if  you have breakthrough atrial fib with extra dose of 200 mg Amiodarone.  . montelukast (SINGULAIR) 10 MG tablet TAKE 1 TABLET BY MOUTH EVERYDAY AT BEDTIME  . triamcinolone cream (KENALOG) 0.1 % Apply topically daily as needed.    No Known Allergies  Social History   Socioeconomic History  . Marital status: Married    Spouse name: None  . Number of children: 2  Occupational History  . Occupation: Scientist, clinical (histocompatibility and immunogenetics): [employer]  Tobacco Use  . Smoking status: Former  Smoker    Packs/day: 1.00    Years: 55.00    Pack years: 55.00    Types: Cigarettes    Last attempt to quit: 02/18/2017    Years since quitting: 0.1  .  Smokeless tobacco: Never Used  . Tobacco comment: trying to quit  mid Jan 2017 - as of 09-20-2015  down to , per pt 2-3 cigarettes per day  Substance and Sexual Activity  . Alcohol use: Yes    Comment: daily  . Drug use: No  . Sexual activity: None  Other Topics Concern  . None  Social History Narrative   Married 25 years   2 children but not with this wife    family history includes Breast cancer in his mother; Ovarian cancer in his sister; Stroke in his unknown relative.  Wt Readings from Last 3 Encounters:  12/23/17 236 lb 9.6 oz (107.3 kg)  12/04/17 238 lb 6.4 oz (108.1 kg)  10/01/17 239 lb 6.4 oz (108.6 kg)    PHYSICAL EXAM BP 134/82   Pulse 66   Ht 5' 11" (1.803 m)   Wt 236 lb 9.6 oz (107.3 kg)   BMI 33.00 kg/m  Physical Exam  Constitutional: He is oriented to person, place, and time. He appears well-developed and well-nourished. No distress.  Well groomed.   HENT:  Head: Normocephalic and atraumatic.  Mouth/Throat: Oropharynx is clear and moist.  Neck: Normal range of motion. Normal carotid pulses, no hepatojugular reflux and no JVD present. Carotid bruit is not present.  Cardiovascular: Normal rate, regular rhythm, normal heart sounds and intact distal pulses. Exam reveals no gallop and no friction rub.  No murmur heard. Pulmonary/Chest: Effort normal and breath sounds normal. No respiratory distress. He has no wheezes. He has no rales.  Abdominal: Soft. Bowel sounds are normal. He exhibits no distension. There is no rebound.  Protuberant, obese abdomen.  Nontender.  No HSM  Musculoskeletal: Normal range of motion. He exhibits no edema (trace bilaeral ankle swelling).  Neurological: He is alert and oriented to person, place, and time.  Psychiatric: He has a normal mood and affect. His behavior is normal.  Judgment and thought content normal.  Nursing note and vitals reviewed.    Adult ECG Report Not checked  Other studies Reviewed: Additional studies/ records that were reviewed today include:  Recent Labs: He is due for PCP to check his lipids soon.    Lab Results  Component Value Date   TSH 1.650 02/19/2017   Lab Results  Component Value Date   ALT 43 03/08/2017   AST 28 03/08/2017   ALKPHOS 84 03/08/2017   BILITOT 0.9 03/08/2017   PFTs - CRP & ESR  ASSESSMENT / PLAN: Problem List Items Addressed This Visit    Cardiomyopathy due to systemic disease ([place]) (Chronic)    Resolved with follow-up echo.  However still has probably some diastolic dysfunction and is on stable dose of diuretic using intermittent PRN doses.  On carvedilol and ARB.      Essential hypertension (Chronic)    Blood pressure relatively well controlled.  He does have room to titrate ARB if necessary.  But for now we will continue current regimen.      Exertional dyspnea (Chronic)    Pretty much at baseline except for the fact that he is somewhat deconditioned.  Volume status seems stable with no signs of PND orthopnea.  I suspect that there is a COPD component. I do recommend that he continue to follow-up with his pulmonologist in order to discuss the PFT results.  There has not been a dramatic change in his pulmonary symptoms over the past 2 years.      On amiodarone therapy (Chronic)    PFTs  checked - DLCO reduced - CRP & ESR normal. This argues against Amio toxicity. Recommend f/u with Dr. Halford Chessman to discuss possible additional testing (? Chest CT).      Paroxysmal atrial fibrillation ([place]): CHA2DS2-VASc Score 3 - On Eliquis - Primary (Chronic)    1 short breakthrough of A. fib not lasting long enough to use PRN Lopressor.  He is now on standing dose of carvedilol plus amiodarone. Anticoagulated with Eliquis and no bleeding issues.  Abnormal PFTs noted, but with normal ESR and CRP, unlikely that this  is related to amiodarone toxicity as it is an inflammatory process.      Tobacco abuse (Chronic)    Smoking cessation instruction/counseling given:  counseled patient on the dangers of tobacco use, advised patient to stop smoking, and reviewed strategies to maximize success  -Again we discussed various options including Chantix etc.  He is not quite to the point where he is ready to make a decision, but continues to listen.         Current medicines are reviewed at length with the patient today. (+/- concerns) none The following changes have been made:See below  Patient Instructions  NO MEDICATION CHANGES    You have been referred to Dr Halford Chessman. Keep your recall appointment with him for Nov 2019.     Your physician wants you to follow-up in [place].You will receive a reminder letter in the mail two months in advance. If you don't receive a letter, please call our office to schedule the follow-up appointment.   If you need a refill on your cardiac medications before your next appointment, please call your pharmacy.    Studies Ordered:   No orders of the defined types were placed in this encounter.     Glenetta Hew, M.D., M.S. Interventional Cardiologist   Pager # [phone] Phone # [phone] [address] [place],  [address]

## 2017-12-25 ENCOUNTER — Encounter: Payer: Self-pay | Admitting: Cardiology

## 2017-12-25 NOTE — Assessment & Plan Note (Signed)
Blood pressure relatively well controlled.  He does have room to titrate ARB if necessary.  But for now we will continue current regimen.

## 2017-12-25 NOTE — Assessment & Plan Note (Signed)
Pretty much at baseline except for the fact that he is somewhat deconditioned.  Volume status seems stable with no signs of PND orthopnea.  I suspect that there is a COPD component. I do recommend that he continue to follow-up with his pulmonologist in order to discuss the PFT results.  There has not been a dramatic change in his pulmonary symptoms over the past 2 years.

## 2017-12-25 NOTE — Assessment & Plan Note (Signed)
Smoking cessation instruction/counseling given:  counseled patient on the dangers of tobacco use, advised patient to stop smoking, and reviewed strategies to maximize success  -Again we discussed various options including Chantix etc.  He is not quite to the point where he is ready to make a decision, but continues to listen.

## 2017-12-25 NOTE — Assessment & Plan Note (Signed)
Resolved with follow-up echo.  However still has probably some diastolic dysfunction and is on stable dose of diuretic using intermittent PRN doses.  On carvedilol and ARB.

## 2017-12-25 NOTE — Assessment & Plan Note (Signed)
1 short breakthrough of A. fib not lasting long enough to use PRN Lopressor.  He is now on standing dose of carvedilol plus amiodarone. Anticoagulated with Eliquis and no bleeding issues.  Abnormal PFTs noted, but with normal ESR and CRP, unlikely that this is related to amiodarone toxicity as it is an inflammatory process.

## 2017-12-28 ENCOUNTER — Other Ambulatory Visit: Payer: Self-pay | Admitting: Family Medicine

## 2018-01-13 ENCOUNTER — Other Ambulatory Visit: Payer: Self-pay | Admitting: Family Medicine

## 2018-01-16 DIAGNOSIS — Z23 Encounter for immunization: Secondary | ICD-10-CM | POA: Diagnosis not present

## 2018-01-24 DIAGNOSIS — G4733 Obstructive sleep apnea (adult) (pediatric): Secondary | ICD-10-CM | POA: Diagnosis not present

## 2018-02-03 ENCOUNTER — Other Ambulatory Visit: Payer: Self-pay | Admitting: Cardiology

## 2018-03-10 DIAGNOSIS — C61 Malignant neoplasm of prostate: Secondary | ICD-10-CM | POA: Diagnosis not present

## 2018-03-12 ENCOUNTER — Encounter: Payer: Self-pay | Admitting: Pulmonary Disease

## 2018-03-12 ENCOUNTER — Ambulatory Visit (INDEPENDENT_AMBULATORY_CARE_PROVIDER_SITE_OTHER): Payer: Federal, State, Local not specified - PPO | Admitting: Pulmonary Disease

## 2018-03-12 VITALS — BP 110/66 | HR 75 | Ht 71.0 in | Wt 235.0 lb

## 2018-03-12 DIAGNOSIS — G4733 Obstructive sleep apnea (adult) (pediatric): Secondary | ICD-10-CM

## 2018-03-12 DIAGNOSIS — J849 Interstitial pulmonary disease, unspecified: Secondary | ICD-10-CM | POA: Diagnosis not present

## 2018-03-12 DIAGNOSIS — Z9989 Dependence on other enabling machines and devices: Secondary | ICD-10-CM | POA: Diagnosis not present

## 2018-03-12 MED ORDER — ALBUTEROL SULFATE HFA 108 (90 BASE) MCG/ACT IN AERS: 2.0000 | INHALATION_SPRAY | Freq: Four times a day (QID) | RESPIRATORY_TRACT | 5 refills | Status: DC | PRN

## 2018-03-12 NOTE — Progress Notes (Signed)
[facility]  Chief Complaint  Patient presents with  . Follow-up    Pt is doing well overall with cpap machine. Pt has increase SOB and wheezing in last few weeks. Pt would like to talk to you more about not breathing well.    Constitutional:  BP 110/66 (BP Location: Left Arm, Cuff Size: Normal)   Pulse 75   Ht 5\' 11"  (1.803 m)   Wt 235 lb (106.6 kg)   SpO2 95%   BMI 32.78 kg/m   Past Medical History:  A fib, HTN, HLD, Gout, Tinea versicolor, Prostate cancer  Brief Summary:  Ronald [name] is a 72 y.o. 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  He started smoking again.  With this he has been getting more short of breath.  He also has more cough, and wheeze.  He had repeat PFT that showed progression of diffusion defect.  He is not having fever, chest pain, hemoptysis, sweats, skin rash, joint swelling, or leg swelling.  He is using CPAP nightly.  No issue with mask fit.  Physical Exam:   Appearance - well kempt   ENMT - clear nasal mucosa, midline nasal  septum, no oral exudates, no LAN, trachea midline  Respiratory - normal chest wall, normal respiratory effort, no accessory muscle use, no wheeze/rales  CV - s1s2 regular rate and rhythm, no murmurs, no peripheral edema, radial pulses symmetric  GI - soft, non tender, no masses  Lymph - no adenopathy noted in neck and axillary areas  MSK - normal gait  Ext - no cyanosis, clubbing, or joint inflammation noted  Skin - no rashes, lesions, or ulcers  Neuro - normal strength, oriented x 3  Psych - normal mood and affect  Discussion:  He has progressive shortness of breath and worsening diffusion defect on recent PFT.  This coincided with him smoking again.  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  He is also on amiodarone, and there is a possibility that he could have lung toxicity from this.  Assessment/Plan:    Interstitial lung disease with clinical diagnosis of respiratory bronchiolitis. - will repeat HRCT chest  Emphysema. - prn albuterol - if CT chest is unrevealing for ILD, then will need to augmentin his inhaler regimen  Upper airway cough syndrome with post nasal drip likely from seasonal allergies. - astelin, flonase, singulair  Obstructive sleep apnea. - he is compliant with CPAP and reports benefit from therapy - continue auto CPAP  Atrial fibrillation. - he is on amiodarone and followed by [name]   Patient Instructions  Will arrange for high resolution CT chest  Ventolin two puffs every 6 hours as needed for cough, wheeze, or chest congestion  Follow up in 2 weeks with Dr. Halford Chessman or Nurse Practitioner    Chesley Mires, MD [place] Pager: [phone] 03/12/2018, 11:22 AM  Flow Sheet     Pulmonary tests:  PFT 11/01/14 >> FEV1 3.42 (104%), FEV1% 81, TLC 7.00 (99%), DLCO 55%, + BD HRCT chest 01/31/15 >> diffuse centrilobular GGO micronodules, mild airtrapping, mild centrilobular/paraseptal emphysema PFT 12/16/15 >> FEV1 3.13 (96%), FEV1% 79, TLC 6.59 (93%), DLCO 54% PFT 12/11/16 >> FEV1 3.23 (96%), FEV1% 78, TLC 7.42 (102%), DLCO 63%, no BD PFT 12/17/17 >> FEV1 3.14 (95%), FEV1% 78, TLC 6.15 (84%), DLCO 46%  Sleep tests:  PSG 07/14/04 >> AHI 77 Auto CPAP 02/10/18 to 03/11/18 >> used  on 30 of 30 nights with average 9 hrs 31 min.  Average AHI 0.6 with median CPAP 9 and 95 th percentile CPAP 11 cm H2O  Cardiac tests:  Echo 04/16/17 >> EF 55 to 60%, grade 1 DD, ascending aorta 44 mm  Medications:   Allergies as of 03/12/2018   No Known Allergies     Medication List        Accurate as of 03/12/18 11:22 AM. Always use your most recent med list.          accu-chek soft touch lancets Check blood sugars once per day. DX: E11.9   acetaminophen 500 MG tablet Commonly known as:  TYLENOL Take 500 mg by mouth every 6 (six) hours as needed  for mild pain or moderate pain.   albuterol 108 (90 Base) MCG/ACT inhaler Commonly known as:  PROVENTIL HFA;VENTOLIN HFA Inhale 2 puffs into the lungs every 6 (six) hours as needed for wheezing or shortness of breath.   ALPRAZolam 0.25 MG tablet Commonly known as:  XANAX Take 1 tablet (0.25 mg total) at bedtime as needed by mouth for anxiety or sleep.   amiodarone 200 MG tablet Commonly known as:  PACERONE Take 1 tablet (200 mg total) by mouth daily.   apixaban 5 MG Tabs tablet Commonly known as:  ELIQUIS Take 1 tablet (5 mg total) by mouth 2 (two) times daily.   atorvastatin 10 MG tablet Commonly known as:  LIPITOR Take 1 tablet (10 mg total) by mouth every evening.   azelastine 0.1 % nasal spray Commonly known as:  ASTELIN Place 1 spray into both nostrils 2 (two) times daily. Use in each nostril as directed   carvedilol 12.5 MG tablet Commonly known as:  COREG Take 1 tablet (12.5 mg total) by mouth 2 (two) times daily.   colchicine 0.6 MG tablet Two tabs at onset of gout flare and then one tab twice daily as needed   furosemide 40 MG tablet Commonly known as:  LASIX Take 1 tablet (40 mg total) by mouth 2 (two) times daily. Take 1 tablet in the morning and second tablet at 2pm daily.   glipiZIDE 5 MG tablet Commonly known as:  GLUCOTROL TAKE 1/2 TABLETS BY MOUTH TWICE DAILY   glucose blood test strip Test once daily dx e11.9   hydrocortisone 2.5 % rectal cream Commonly known as:  ANUSOL-HC Apply 2 (two) times daily topically.   KLOR-CON M20 20 MEQ tablet Generic drug:  potassium chloride SA TAKE 2 TABLETS BY MOUTH EVERY DAY   losartan 25 MG tablet Commonly known as:  COZAAR TAKE 1 TABLET BY MOUTH EVERY DAY   metoprolol tartrate 25 MG tablet Commonly known as:  LOPRESSOR Take  1 tablet if  you have breakthrough atrial fib with extra dose of 200 mg Amiodarone.   montelukast 10 MG tablet Commonly known as:  SINGULAIR TAKE 1 TABLET BY MOUTH EVERYDAY AT  BEDTIME   triamcinolone cream 0.1 % Commonly known as:  KENALOG Apply topically daily as needed.       Past Surgical History:  He  has a past surgical history that includes Colonoscopy (last one 06-07-2011); Prostate biopsy (05/15/12); Tonsillectomy (as child); Laminectomy and microdiscectomy lumbar spine (12-23-2003); LAPAROSCOPIC BILATERAL INGUINAL HERNIA REPAIR/  UMBILICAL HERNIA REPAIR WITH MESH/  ASPIRATION LEFT HYDROCELE (07-11-2012); TEE without cardioversion (N/A, 05/07/2014); Cardioversion (N/A, 05/07/2014); NM MYOVIEW LTD (05/18/2014); Cardioversion (N/A, 09/09/2014); Hydrocele surgery (Bilateral, 09/26/2015); transthoracic echocardiogram (11/'18; 1/'19); Cataract extraction w/ intraocular lens  implant, bilateral (08  and 09/  2018); Transurethral resection of prostate (N/A, 05/30/2017); Gold seed implant (N/A, 09/09/2017); and SPACE OAR INSTILLATION (N/A, 09/09/2017).  Family History:  His family history includes Breast cancer in his mother; Ovarian cancer in his sister; Stroke in his unknown relative.  Social History:  He  reports that he has been smoking cigarettes. He has a 45.00 pack-year smoking history. He has never used smokeless tobacco. He reports that he drinks about 14.0 standard drinks of alcohol per week. He reports that he does not use drugs.

## 2018-03-12 NOTE — Patient Instructions (Signed)
Will arrange for high resolution CT chest  Ventolin two puffs every 6 hours as needed for cough, wheeze, or chest congestion  Follow up in 2 weeks with Dr. Halford Chessman or Nurse Practitioner

## 2018-03-17 ENCOUNTER — Ambulatory Visit (INDEPENDENT_AMBULATORY_CARE_PROVIDER_SITE_OTHER)
Admission: RE | Admit: 2018-03-17 | Discharge: 2018-03-17 | Disposition: A | Discharge status: Home / Self Care | Payer: Federal, State, Local not specified - PPO | Source: Ambulatory Visit | Attending: Pulmonary Disease | Admitting: Pulmonary Disease

## 2018-03-17 DIAGNOSIS — C61 Malignant neoplasm of prostate: Secondary | ICD-10-CM | POA: Diagnosis not present

## 2018-03-17 DIAGNOSIS — J849 Interstitial pulmonary disease, unspecified: Secondary | ICD-10-CM | POA: Diagnosis not present

## 2018-03-17 DIAGNOSIS — R351 Nocturia: Secondary | ICD-10-CM | POA: Diagnosis not present

## 2018-03-17 DIAGNOSIS — N401 Enlarged prostate with lower urinary tract symptoms: Secondary | ICD-10-CM | POA: Diagnosis not present

## 2018-03-17 DIAGNOSIS — R918 Other nonspecific abnormal finding of lung field: Secondary | ICD-10-CM | POA: Diagnosis not present

## 2018-03-26 ENCOUNTER — Telehealth: Payer: Self-pay | Admitting: Pulmonary Disease

## 2018-03-26 ENCOUNTER — Encounter: Payer: Self-pay | Admitting: Pulmonary Disease

## 2018-03-26 ENCOUNTER — Ambulatory Visit (INDEPENDENT_AMBULATORY_CARE_PROVIDER_SITE_OTHER): Payer: Federal, State, Local not specified - PPO | Admitting: Pulmonary Disease

## 2018-03-26 VITALS — BP 116/78 | HR 73 | Ht 71.0 in | Wt 239.0 lb

## 2018-03-26 DIAGNOSIS — J432 Centrilobular emphysema: Secondary | ICD-10-CM

## 2018-03-26 DIAGNOSIS — G4733 Obstructive sleep apnea (adult) (pediatric): Secondary | ICD-10-CM | POA: Diagnosis not present

## 2018-03-26 DIAGNOSIS — Z720 Tobacco use: Secondary | ICD-10-CM | POA: Diagnosis not present

## 2018-03-26 DIAGNOSIS — J84115 Respiratory bronchiolitis interstitial lung disease: Secondary | ICD-10-CM | POA: Diagnosis not present

## 2018-03-26 DIAGNOSIS — Z9989 Dependence on other enabling machines and devices: Secondary | ICD-10-CM

## 2018-03-26 MED ORDER — BUDESONIDE-FORMOTEROL FUMARATE 160-4.5 MCG/ACT IN AERO: 2.0000 | INHALATION_SPRAY | Freq: Two times a day (BID) | RESPIRATORY_TRACT | 12 refills | Status: DC

## 2018-03-26 NOTE — Progress Notes (Signed)
[facility]  Chief Complaint  Patient presents with  . Follow-up    Pt doing well overall with cpap machine. Here today for CT scan results.    Constitutional:  BP 116/78 (BP Location: Left Arm, Cuff Size: Normal)   Pulse 73   Ht 5\' 11"  (1.803 m)   Wt 239 lb (108.4 kg)   SpO2 95%   BMI 33.33 kg/m   Past Medical History:  A fib, HTN, HLD, Gout, Tinea versicolor, Prostate cancer  Brief Summary:  Ronald [name] is a 71 y.o. 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  He feels albuterol has helped.  Not having as much cough, congestion, or wheeze.  Using this twice per day.  He is still smoking, but started using nicotine patch.  He used electronic cigarette before and this seemed to help.  He is concerned about adding another medication to help stop smoking.  Physical Exam:   Appearance - well kempt   ENMT - no sinus tenderness, no nasal discharge, no oral exudate  Neck - no masses, trachea midline, no thyromegaly, no elevation in JVP  Respiratory - normal appearance of chest wall, normal respiratory effort w/o accessory muscle use, no dullness on percussion, no tactile fremitus, no wheezing or rales  CV - s1s2 regular rate and rhythm, no murmurs, no peripheral edema, no varicosities, radial pulses symmetric  GI - soft, non tender, no masses, no hepatosplenomegaly  Lymph - no adenopathy noted in neck and axillary areas  MSK - normal gait  Ext - no cyanosis, clubbing, or joint inflammation noted  Skin - no rashes, lesions, or ulcers  Neuro - normal strength, oriented x 3  Psych - normal mood and affect   Discussion:  His CT chest shows changes of respiratory bronchiolitis related to smoking, and emphysema.  Assessment/Plan:   Interstitial lung disease with clinical diagnosis of respiratory bronchiolitis. - discussed importance of smoking cessation - he will continue nicotine patch and try electronic  cigarettes  Emphysema. - add symbicort - prn albuterol  Upper airway cough syndrome with post nasal drip likely from seasonal allergies. - astelin, singulair  Obstructive sleep apnea. - he is compliant with CPAP and reports benefit from therapy - continue auto CPAP  Atrial fibrillation. - don't think PFT or CT chest findings are related to amiodarone lung toxicity - okay to continue amiodarone per [name]   Patient Instructions  Symbicort two puffs in the morning and two puffs at night, and rinse mouth after each use  Albuterol 2 puffs every 6 hours as needed for cough, wheeze, or chest congestion  Follow up in 6 weeks    Chesley Mires, MD [place] Pager: [phone] 03/26/2018, 5:19 PM  Flow Sheet     Pulmonary tests:  PFT 11/01/14 >> FEV1 3.42 (104%), FEV1% 81, TLC 7.00 (99%), DLCO 55%, + BD HRCT chest 01/31/15 >> diffuse centrilobular GGO micronodules, mild airtrapping, mild centrilobular/paraseptal emphysema PFT 12/16/15 >> FEV1 3.13 (96%), FEV1% 79, TLC 6.59 (93%), DLCO 54% PFT 12/11/16 >> FEV1 3.23 (96%), FEV1% 78, TLC 7.42 (102%), DLCO 63%, no BD PFT 12/17/17 >> FEV1 3.14 (95%), FEV1% 78, TLC 6.15 (84%), DLCO 46% HRCT chest 12/091/19 >> mild diffuse GGO in mid to upper lungs, mild centrilobular and paraseptal emphysema  Sleep tests:  PSG 07/14/04 >> AHI 77 Auto CPAP 02/10/18 to 03/11/18 >> used on 30 of 30 nights with average 9 hrs 31 min.  Average AHI 0.6 with  median CPAP 9 and 95 th percentile CPAP 11 cm H2O  Cardiac tests:  Echo 04/16/17 >> EF 55 to 60%, grade 1 DD, ascending aorta 44 mm  Medications:   Allergies as of 03/26/2018   No Known Allergies     Medication List       Accurate as of March 26, 2018  5:19 PM. Always use your most recent med list.        accu-chek soft touch lancets Check blood sugars once per day. DX: E11.9   acetaminophen 500 MG tablet Commonly known as:  TYLENOL Take 500 mg by mouth every  6 (six) hours as needed for mild pain or moderate pain.   albuterol 108 (90 Base) MCG/ACT inhaler Commonly known as:  VENTOLIN HFA Inhale 2 puffs into the lungs every 6 (six) hours as needed for wheezing or shortness of breath.   ALPRAZolam 0.25 MG tablet Commonly known as:  XANAX Take 1 tablet (0.25 mg total) at bedtime as needed by mouth for anxiety or sleep.   amiodarone 200 MG tablet Commonly known as:  PACERONE Take 1 tablet (200 mg total) by mouth daily.   apixaban 5 MG Tabs tablet Commonly known as:  ELIQUIS Take 1 tablet (5 mg total) by mouth 2 (two) times daily.   atorvastatin 10 MG tablet Commonly known as:  LIPITOR Take 1 tablet (10 mg total) by mouth every evening.   azelastine 0.1 % nasal spray Commonly known as:  ASTELIN Place 1 spray into both nostrils 2 (two) times daily. Use in each nostril as directed   budesonide-formoterol 160-4.5 MCG/ACT inhaler Commonly known as:  SYMBICORT Inhale 2 puffs into the lungs 2 (two) times daily.   carvedilol 12.5 MG tablet Commonly known as:  COREG Take 1 tablet (12.5 mg total) by mouth 2 (two) times daily.   colchicine 0.6 MG tablet Two tabs at onset of gout flare and then one tab twice daily as needed   furosemide 40 MG tablet Commonly known as:  LASIX Take 1 tablet (40 mg total) by mouth 2 (two) times daily. Take 1 tablet in the morning and second tablet at 2pm daily.   glipiZIDE 5 MG tablet Commonly known as:  GLUCOTROL TAKE 1/2 TABLETS BY MOUTH TWICE DAILY   glucose blood test strip Commonly known as:  ACCU-CHEK AVIVA PLUS Test once daily dx e11.9   hydrocortisone 2.5 % rectal cream Commonly known as:  ANUSOL-HC Apply 2 (two) times daily topically.   KLOR-CON M20 20 MEQ tablet Generic drug:  potassium chloride SA TAKE 2 TABLETS BY MOUTH EVERY DAY   losartan 25 MG tablet Commonly known as:  COZAAR TAKE 1 TABLET BY MOUTH EVERY DAY   metoprolol tartrate 25 MG tablet Commonly known as:  LOPRESSOR Take   1 tablet if  you have breakthrough atrial fib with extra dose of 200 mg Amiodarone.   montelukast 10 MG tablet Commonly known as:  SINGULAIR TAKE 1 TABLET BY MOUTH EVERYDAY AT BEDTIME   triamcinolone cream 0.1 % Commonly known as:  KENALOG Apply topically daily as needed.       Past Surgical History:  He  has a past surgical history that includes Colonoscopy (last one 06-07-2011); Prostate biopsy (05/15/12); Tonsillectomy (as child); Laminectomy and microdiscectomy lumbar spine (12-23-2003); LAPAROSCOPIC BILATERAL INGUINAL HERNIA REPAIR/  UMBILICAL HERNIA REPAIR WITH MESH/  ASPIRATION LEFT HYDROCELE (07-11-2012); TEE without cardioversion (N/A, 05/07/2014); Cardioversion (N/A, 05/07/2014); NM MYOVIEW LTD (05/18/2014); Cardioversion (N/A, 09/09/2014); Hydrocele surgery (Bilateral, 09/26/2015); transthoracic echocardiogram (  11/'18; 1/'19); Cataract extraction w/ intraocular lens  implant, bilateral (08 and 09/  2018); Transurethral resection of prostate (N/A, 05/30/2017); Gold seed implant (N/A, 09/09/2017); and SPACE OAR INSTILLATION (N/A, 09/09/2017).  Family History:  His family history includes Breast cancer in his mother; Ovarian cancer in his sister; Stroke in an other family member.  Social History:  He  reports that he has been smoking cigarettes. He has a 45.00 pack-year smoking history. He has never used smokeless tobacco. He reports current alcohol use of about 14.0 standard drinks of alcohol per week. He reports that he does not use drugs.

## 2018-03-26 NOTE — Telephone Encounter (Signed)
Entered in error

## 2018-03-26 NOTE — Patient Instructions (Signed)
Symbicort two puffs in the morning and two puffs at night, and rinse mouth after each use  Albuterol 2 puffs every 6 hours as needed for cough, wheeze, or chest congestion  Follow up in 6 weeks

## 2018-03-27 ENCOUNTER — Other Ambulatory Visit: Payer: Self-pay | Admitting: Family Medicine

## 2018-03-28 ENCOUNTER — Other Ambulatory Visit: Payer: Federal, State, Local not specified - PPO

## 2018-04-12 ENCOUNTER — Other Ambulatory Visit: Payer: Self-pay | Admitting: Family Medicine

## 2018-04-22 ENCOUNTER — Other Ambulatory Visit: Payer: Self-pay

## 2018-04-22 ENCOUNTER — Encounter: Payer: Self-pay | Admitting: Family Medicine

## 2018-04-22 ENCOUNTER — Ambulatory Visit (INDEPENDENT_AMBULATORY_CARE_PROVIDER_SITE_OTHER): Payer: Federal, State, Local not specified - PPO | Admitting: Family Medicine

## 2018-04-22 VITALS — BP 130/84 | HR 66 | Temp 98.2°F | Ht 71.0 in | Wt 241.0 lb

## 2018-04-22 DIAGNOSIS — I10 Essential (primary) hypertension: Secondary | ICD-10-CM

## 2018-04-22 DIAGNOSIS — Z79899 Other long term (current) drug therapy: Secondary | ICD-10-CM | POA: Diagnosis not present

## 2018-04-22 DIAGNOSIS — K760 Fatty (change of) liver, not elsewhere classified: Secondary | ICD-10-CM | POA: Insufficient documentation

## 2018-04-22 DIAGNOSIS — E1140 Type 2 diabetes mellitus with diabetic neuropathy, unspecified: Secondary | ICD-10-CM | POA: Diagnosis not present

## 2018-04-22 DIAGNOSIS — E8881 Metabolic syndrome: Secondary | ICD-10-CM | POA: Diagnosis not present

## 2018-04-22 LAB — POCT GLYCOSYLATED HEMOGLOBIN (HGB A1C): Hemoglobin A1C: 7 % — AB (ref 4.0–5.6)

## 2018-04-22 LAB — BASIC METABOLIC PANEL
BUN: 11 mg/dL (ref 6–23)
CO2: 30 mEq/L (ref 19–32)
Calcium: 9.4 mg/dL (ref 8.4–10.5)
Chloride: 98 mEq/L (ref 96–112)
Creatinine, Ser: 1.38 mg/dL (ref 0.40–1.50)
GFR: 53.69 mL/min — ABNORMAL LOW (ref >60.00)
Glucose, Bld: 127 mg/dL — ABNORMAL HIGH (ref 70–99)
Potassium: 4.4 mEq/L (ref 3.5–5.1)
Sodium: 137 mEq/L (ref 135–145)

## 2018-04-22 LAB — HEPATIC FUNCTION PANEL
ALT: 27 U/L (ref 0–53)
AST: 23 U/L (ref 0–37)
Albumin: 4.2 g/dL (ref 3.5–5.2)
Alkaline Phosphatase: 91 U/L (ref 39–117)
Bilirubin, Direct: 0.2 mg/dL (ref 0.0–0.3)
Total Bilirubin: 0.7 mg/dL (ref 0.2–1.2)
Total Protein: 6.4 g/dL (ref 6.0–8.3)

## 2018-04-22 LAB — TSH: TSH: 3.92 u[IU]/mL (ref 0.35–4.50)

## 2018-04-22 LAB — LIPID PANEL
Cholesterol: 173 mg/dL (ref 0–200)
HDL: 44.7 mg/dL (ref >39.00)
NonHDL: 128.16
TRIGLYCERIDES: 352 mg/dL — AB (ref 0.0–149.0)
Total CHOL/HDL Ratio: 4
VLDL: 70.4 mg/dL — ABNORMAL HIGH (ref 0.0–40.0)

## 2018-04-22 LAB — LDL CHOLESTEROL, DIRECT: Direct LDL: 69 mg/dL

## 2018-04-22 MED ORDER — SEMAGLUTIDE(0.25 OR 0.5MG/DOS) 2 MG/1.5ML ~~LOC~~ SOPN: 0.2500 mg | PEN_INJECTOR | SUBCUTANEOUS | 3 refills | Status: DC

## 2018-04-22 MED ORDER — ACCU-CHEK AVIVA PLUS W/DEVICE KIT: PACK | 1 refills | Status: DC

## 2018-04-22 NOTE — Progress Notes (Signed)
Subjective:     Patient ID: Ronald [name], male   DOB: 01-Jul-1945, 73 y.o.   MRN: [id number]  HPI Patient seen for medical follow-up.  He had admission little over a year ago for urosepsis following prostate biopsy.  His cultures remained negative.  He had acute renal failure and has had gradual improvement in renal function since then.  He has not had follow-up basic chemistries in several months.  He recently finished some radiation for his prostate cancer and apparently his PSAs have been very well controlled.  Chronic problems include history of atrial fibrillation, hypertension, combined systolic and diastolic heart failure, interstitial lung disease, obstructive sleep apnea, type 2 diabetes, ongoing nicotine use, amiodarone therapy.  He had seen pulmonologist recently and had CT scan done of the chest and there was mention of hepatic steatosis changes.  He would like to discuss that today.  He has type 2 diabetes.  Last A1c was 6.0%.  Currently treated with glipizide 5 mg 1/2 tablet twice daily.  No recent hypoglycemic symptoms.  He has had some recent weight gain.  Very little activity in terms of exercise.  Past Medical History:  Diagnosis Date  . Allergic rhinitis   . Anticoagulant long-term use    eliquis  . Cardiomyopathy due to systemic disease [facility])    followed by dr harding  . COPD with emphysema [facility])    pulmologist-  dr Halford Chessman  . Dyspnea    occasional per pt  . History of colon polyps    tubular adenoma 2013  . History of gout    09-05-2017 last flare-up  05/ 2019 3 wks ago, feet  . History of sepsis 02/18/2017   per d/c note probable uti, acute chf, acute renal failure, hypoxia  . Hyperlipidemia   . Hyperplasia of prostate with lower urinary tract symptoms (LUTS)   . Hypertension   . OSA on CPAP    per study 08-03-2004  Severe OSA  . Persistent atrial fibrillation    cardiologist --  dr Dorris Carnes--  post cardioversion 05-07-2014  . Prostate cancer [facility]) urologsit-   dr Alyson Ingles--- as of 05-21-2017 per pt last PSA 11 approx.   Dx  2014--  stage T1c, Gleason 3+3=6, PSA 6.67--  Active surveillance/  04/ 2019  Stage T1b, Gleason 3+4, PSA 12.8- plan external radiation therapy    . 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 ([place])    pulmologist-  dr Halford Chessman  . Seasonal allergies   . Sigmoid diverticulosis   . Systolic and diastolic CHF, chronic [facility])    cardiologist-  [name]  . Tinea versicolor   . Type 2 diabetes mellitus ([place])   . Wears hearing aid in both ears    Past Surgical History:  Procedure Laterality Date  . CARDIOVERSION N/A 05/07/2014   Procedure: CARDIOVERSION;  Surgeon: [name], MD;  Location: [facility] ENDOSCOPY;  Service: Cardiovascular;  Laterality: N/A;  . CARDIOVERSION N/A 09/09/2014   Procedure: CARDIOVERSION;  Surgeon: Lelon Perla, MD;  Location: [facility] ENDOSCOPY;  Service: Cardiovascular;  Laterality: N/A;  successfully  . CATARACT EXTRACTION W/ INTRAOCULAR LENS  IMPLANT, BILATERAL  08 and 09/  2018  . COLONOSCOPY  last one 06-07-2011  . GOLD SEED IMPLANT N/A 09/09/2017   Procedure: GOLD SEED IMPLANT;  Surgeon: [name], MD;  Location: [facility];  Service: Urology;  Laterality: N/A;  . HYDROCELE EXCISION Bilateral 09/26/2015   Procedure: HYDROCELECTOMY ADULT;  Surgeon: [name], MD;  Location: [place];  Service: Urology;  Laterality: Bilateral;  . LAMINECTOMY AND MICRODISCECTOMY LUMBAR SPINE  12-23-2003   Left L5 -- S1  . LAPAROSCOPIC BILATERAL INGUINAL HERNIA REPAIR/  UMBILICAL HERNIA REPAIR WITH MESH/  ASPIRATION LEFT HYDROCELE  07-11-2012  . NM MYOVIEW LTD  05/18/2014   Low risk study. Normal perfusion: No ischemia or infarction. Mild LV dysfunction - 46% (does not correlate with echocardiographic EF of 50-55%)  . PROSTATE BIOPSY  05/15/12   Clinically both [place]  . SPACE OAR INSTILLATION N/A 09/09/2017   Procedure: SPACE OAR INSTILLATION;  Surgeon: [name], MD;  Location: [facility];  Service: Urology;  Laterality: N/A;  . TEE WITHOUT CARDIOVERSION N/A 05/07/2014   Procedure: TRANSESOPHAGEAL ECHOCARDIOGRAM (TEE);  Surgeon: [name], MD;  Location[facility] ENDOSCOPY;  Service: Cardiovascular;  Laterality: N/A;   mild atherosclerosis plaque of aorta,  mild AR, MR, and TR,  no cardiac source of emboli  . TONSILLECTOMY  as child  . TRANSTHORACIC ECHOCARDIOGRAM  11/'18; 1/'19   a) In setting of sepsis: EF of 40-45%.  Diffuse hypokinesis.  No RWMA.  Biatrial enlargement.;; b) ** f/u Jan 2019: Normal LVF 55-60%** , mildly dilated aortic root(38 mm) and ascending aorta (44 mm). Compared to prior echo, LVEF has improved.  . TRANSURETHRAL RESECTION OF PROSTATE N/A 05/30/2017   Procedure: TRANSURETHRAL RESECTION OF THE PROSTATE (TURP);  Surgeon: [name], MD;  Location: [facility];  Service: Urology;  Laterality: N/A;    reports that he has been smoking cigarettes. He has a 45.00 pack-year smoking history. He has never used smokeless tobacco. He reports current alcohol use of about 14.0 standard drinks of alcohol per week. He reports that he does not use drugs. family history includes Breast cancer in his mother; Ovarian cancer in his sister; Stroke in an other family member. No Known Allergies   Review of Systems  Constitutional: Negative for fatigue.  Eyes: Negative for visual disturbance.  Respiratory: Negative for cough, chest tightness and shortness of breath.   Cardiovascular: Negative for chest pain and palpitations.  Gastrointestinal: Negative for abdominal pain.  Genitourinary: Negative for dysuria.  Neurological: Negative for dizziness, syncope, weakness, light-headedness and headaches.  Hematological: Negative for adenopathy. Does not bruise/bleed easily.       Objective:   Physical Exam Constitutional:      Appearance: Normal appearance.  Cardiovascular:     Rate and Rhythm: Normal rate.  Pulmonary:      Effort: Pulmonary effort is normal.     Breath sounds: Normal breath sounds.  Abdominal:     Palpations: Abdomen is soft. There is no mass.     Tenderness: There is no abdominal tenderness. There is no guarding.  Musculoskeletal:        General: No swelling.  Neurological:     Mental Status: He is alert.        Assessment:     #1 type 2 diabetes.  Control has worsened somewhat with A1c today 7.0%  #2 recent weight gain which is likely exacerbating #1  #3 hepatic steatosis changes noted on recent CT lung  #4 history of atrial fibrillation patient currently on anticoagulation and rate controlled  #5 hyperlipidemia  #6 history of prostate cancer which is been treated with radiation therapy    Plan:     -Discussed significance of hepatic steatosis and how this can eventually lead to nonalcoholic cirrhosis.  We discussed importance of weight loss and moderation in  alcohol intake.  -We recommended discontinuation of glipizide and consider trial of Ozempic 0.25 mg subcutaneous once weekly for 4 weeks and then titrate to 0.5 mg subcutaneous once weekly.  He has no history of pancreatitis or thyroid cancer.  Discussed possible side effects including nausea.  I think changing from sulfonylurea to GLP-1 medication can hopefully assist with his weight loss which we hope in turn will help his hepatic steatosis  -We will check other labs including lipid panel, hepatic panel, basic metabolic panel, and TSH (amiodaraone therapy).  -Plan routine follow-up in 3 months and recheck A1c then  [name] MD [facility]

## 2018-04-22 NOTE — Patient Instructions (Addendum)
Nonalcoholic Fatty Liver Disease Diet Nonalcoholic fatty liver disease is a condition that causes fat to accumulate in and around the liver. The disease makes it harder for the liver to work the way that it should. Following a healthy diet can help to keep nonalcoholic fatty liver disease under control. It can also help to prevent or improve conditions that are associated with the disease, such as heart disease, diabetes, high blood pressure, and abnormal cholesterol levels. Along with regular exercise, this diet:  Promotes weight loss.  Helps to control blood sugar levels.  Helps to improve the way that the body uses insulin. What do I need to know about this diet?  Use the glycemic index (GI) to plan your meals. The index tells you how quickly a food will raise your blood sugar. Choose low-GI foods. These foods take a longer time to raise blood sugar.  Keep track of how many calories you take in. Eating the right amount of calories will help you to achieve a healthy weight.  You may want to follow a Mediterranean diet. This diet includes a lot of vegetables, lean meats or fish, whole grains, fruits, and healthy oils and fats. What foods can I eat? Grains Whole grains, such as whole-wheat or whole-grain breads, crackers, tortillas, cereals, and pasta. Stone-ground whole wheat. Pumpernickel bread. Unsweetened oatmeal. Bulgur. Barley. Quinoa. Brown or wild rice. Corn or whole-wheat flour tortillas. Vegetables Lettuce. Spinach. Peas. Beets. Cauliflower. Cabbage. Broccoli. Carrots. Tomatoes. Squash. Eggplant. Herbs. Peppers. Onions. Cucumbers. Brussels sprouts. Yams and sweet potatoes. Beans. Lentils. Fruits Bananas. Apples. Oranges. Grapes. Papaya. Mango. Pomegranate. Kiwi. Grapefruit. Cherries. Meats and Other Protein Sources Seafood and shellfish. Lean meats. Poultry. Tofu. Dairy Low-fat or fat-free dairy products, such as yogurt, cottage cheese, and cheese. Beverages Water. Sugar-free  drinks. Tea. Coffee. Low-fat or skim milk. Milk alternatives, such as soy or almond milk. Real fruit juice. Condiments Mustard. Relish. Low-fat, low-sugar ketchup and barbecue sauce. Low-fat or fat-free mayonnaise. Sweets and Desserts Sugar-free sweets. Fats and Oils Avocado. Canola or olive oil. Nuts and nut butters. Seeds. The items listed above may not be a complete list of recommended foods or beverages. Contact your dietitian for more options. What foods are not recommended? Palm oil and coconut oil. Processed foods. Fried foods. Sweetened drinks, such as sweet tea, milkshakes, snow cones, iced sweet drinks, and sodas. Alcohol. Sweets. Foods that contain a lot of salt or sodium. The items listed above may not be a complete list of foods and beverages to avoid. Contact your dietitian for more information. This information is not intended to replace advice given to you by your health care provider. Make sure you discuss any questions you have with your health care provider. Document Released: 08/10/2014 Document Revised: 09/01/2015 Document Reviewed: 04/20/2014 Elsevier Interactive Patient Education  2019 [place] the Glipizide  Start the Ozempic 0.25 mg once weekly for 4 weeks and then will increase to 0.5 mg once weekly.

## 2018-04-23 ENCOUNTER — Other Ambulatory Visit: Payer: Self-pay | Admitting: Family Medicine

## 2018-04-23 ENCOUNTER — Other Ambulatory Visit: Payer: Self-pay | Admitting: Cardiology

## 2018-04-30 ENCOUNTER — Other Ambulatory Visit: Payer: Self-pay | Admitting: Cardiology

## 2018-05-12 ENCOUNTER — Ambulatory Visit (INDEPENDENT_AMBULATORY_CARE_PROVIDER_SITE_OTHER): Payer: Federal, State, Local not specified - PPO | Admitting: Pulmonary Disease

## 2018-05-12 ENCOUNTER — Encounter: Payer: Self-pay | Admitting: Pulmonary Disease

## 2018-05-12 VITALS — BP 120/82 | HR 71 | Ht 71.0 in | Wt 239.6 lb

## 2018-05-12 DIAGNOSIS — Z789 Other specified health status: Secondary | ICD-10-CM | POA: Diagnosis not present

## 2018-05-12 DIAGNOSIS — J849 Interstitial pulmonary disease, unspecified: Secondary | ICD-10-CM | POA: Diagnosis not present

## 2018-05-12 DIAGNOSIS — J432 Centrilobular emphysema: Secondary | ICD-10-CM | POA: Diagnosis not present

## 2018-05-12 DIAGNOSIS — G4733 Obstructive sleep apnea (adult) (pediatric): Secondary | ICD-10-CM | POA: Diagnosis not present

## 2018-05-12 DIAGNOSIS — Z720 Tobacco use: Secondary | ICD-10-CM

## 2018-05-12 DIAGNOSIS — J84115 Respiratory bronchiolitis interstitial lung disease: Secondary | ICD-10-CM | POA: Diagnosis not present

## 2018-05-12 DIAGNOSIS — Z9989 Dependence on other enabling machines and devices: Secondary | ICD-10-CM

## 2018-05-12 NOTE — Progress Notes (Signed)
[facility]  Chief Complaint  Patient presents with  . Follow-up    Pt doing well overall with cpap machine. Pt has productive cough-clear/yellow.     Constitutional:  BP 120/82 (BP Location: Left Arm, Cuff Size: Normal)   Pulse 71   Ht '5\' 11"'  (1.803 m)   Wt 239 lb 9.6 oz (108.7 kg)   SpO2 95%   BMI 33.42 kg/m   Past Medical History:  A fib, HTN, HLD, Gout, Tinea versicolor, Prostate cancer  Brief Summary:  Ronald [name] is a 73 y.o. 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  He feels symbicort has helped.  Breathing much better.  Still has to use albuterol, but not as much as when he first started.  Not having as much cough or wheeze.  He only smokes tobacco cigarettes occasionally now.  He is vaping.  Down to 3 mg of nicotine  He feels this has helped his transition.  Using CPAP nightly.  No issues with mask fit.  No having sinus congestion, dry mouth, or sore throat.  Physical Exam:   Appearance - well kempt   ENMT - no sinus tenderness, no nasal discharge, no oral exudate  Neck - no masses, trachea midline, no thyromegaly, no elevation in JVP  Respiratory - normal appearance of chest wall, normal respiratory effort w/o accessory muscle use, no dullness on percussion, no wheezing or rales  CV - s1s2 regular rate and rhythm, no murmurs, no peripheral edema, radial pulses symmetric  GI - soft, non tender  Lymph - no adenopathy noted in neck and axillary areas  MSK - normal gait  Ext - no cyanosis, clubbing, or joint inflammation noted  Skin - no rashes, lesions, or ulcers  Neuro - normal strength, oriented x 3  Psych - normal mood and affect es, lesions, or ulcers .  Assessment/Plan:   Interstitial lung disease with clinical diagnosis of respiratory bronchiolitis. - encouraged him to keep up with his smoking cessation efforts - he is using electronic cigarettes  Emphysema. - can try changing  symbicort to 1 puff bid - continue prn albuterol  Upper airway cough syndrome with post nasal drip likely from seasonal allergies. - astelin, singulair  Obstructive sleep apnea. - he is compliant with CPAP - continue auto CPAP  Atrial fibrillation. - continue amiodarone per cardiology   Patient Instructions  Can try changing symbicort to one puff in the morning and one puff at night  Follow up in 6 months  A total of  26 minutes were spent face to face with the patient and more than half of that time involved counseling or coordination of care.   Chesley Mires, MD [facility] Pager: [phone] 05/12/2018, 11:33 AM  Flow Sheet     Pulmonary tests:  PFT 11/01/14 >> FEV1 3.42 (104%), FEV1% 81, TLC 7.00 (99%), DLCO 55%, + BD PFT 12/16/15 >> FEV1 3.13 (96%), FEV1% 79, TLC 6.59 (93%), DLCO 54% PFT 12/11/16 >> FEV1 3.23 (96%), FEV1% 78, TLC 7.42 (102%), DLCO 63%, no BD PFT 12/17/17 >> FEV1 3.14 (95%), FEV1% 78, TLC 6.15 (84%), DLCO 46%  Chest imaging:  HRCT chest 01/31/15 >> diffuse centrilobular GGO micronodules, mild airtrapping, mild centrilobular/paraseptal emphysema HRCT chest 12/091/19 >> mild diffuse GGO in mid to upper lungs, mild centrilobular and paraseptal emphysema  Sleep tests:  PSG 07/14/04 >> AHI 77 Auto CPAP 04/12/18 to 05/11/18 >> used on 30 of 30 nights with average 10 hrs 1  min.  Average AHI 0.9 with median CPAP 9 and 95 th percentile CPAP 11 cm H2O  Cardiac tests:  Echo 04/16/17 >> EF 55 to 60%, grade 1 DD, ascending aorta 44 mm  Medications:   Allergies as of 05/12/2018   No Known Allergies     Medication List       Accurate as of May 12, 2018 11:33 AM. Always use your most recent med list.        ACCU-CHEK AVIVA PLUS w/Device Kit Use blood glucose machine as instructed on your strips   accu-chek soft touch lancets Check blood sugars once per day. DX: E11.9   acetaminophen 500 MG tablet Commonly known as:  TYLENOL Take  500 mg by mouth every 6 (six) hours as needed for mild pain or moderate pain.   albuterol 108 (90 Base) MCG/ACT inhaler Commonly known as:  VENTOLIN HFA Inhale 2 puffs into the lungs every 6 (six) hours as needed for wheezing or shortness of breath.   ALPRAZolam 0.25 MG tablet Commonly known as:  XANAX Take 1 tablet (0.25 mg total) at bedtime as needed by mouth for anxiety or sleep.   amiodarone 200 MG tablet Commonly known as:  PACERONE TAKE 1 TABLET BY MOUTH EVERY DAY   atorvastatin 10 MG tablet Commonly known as:  LIPITOR TAKE 1 TABLET BY MOUTH EVERY DAY IN THE EVENING   azelastine 0.1 % nasal spray Commonly known as:  ASTELIN Place 1 spray into both nostrils 2 (two) times daily. Use in each nostril as directed   budesonide-formoterol 160-4.5 MCG/ACT inhaler Commonly known as:  SYMBICORT Inhale 2 puffs into the lungs 2 (two) times daily.   carvedilol 12.5 MG tablet Commonly known as:  COREG Take 1 tablet (12.5 mg total) by mouth 2 (two) times daily.   colchicine 0.6 MG tablet Two tabs at onset of gout flare and then one tab twice daily as needed   ELIQUIS 5 MG Tabs tablet Generic drug:  apixaban TAKE 1 TABLET TWICE A DAY   furosemide 40 MG tablet Commonly known as:  LASIX TAKE 1-2 TABLETS DAILY AS NEEDED.   glucose blood test strip Commonly known as:  ACCU-CHEK AVIVA PLUS Test once daily dx e11.9   hydrocortisone 2.5 % rectal cream Commonly known as:  ANUSOL-HC Apply 2 (two) times daily topically.   KLOR-CON M20 20 MEQ tablet Generic drug:  potassium chloride SA TAKE 2 TABLETS BY MOUTH EVERY DAY   losartan 25 MG tablet Commonly known as:  COZAAR TAKE 1 TABLET BY MOUTH EVERY DAY   metoprolol tartrate 25 MG tablet Commonly known as:  LOPRESSOR Take  1 tablet if  you have breakthrough atrial fib with extra dose of 200 mg Amiodarone.   montelukast 10 MG tablet Commonly known as:  SINGULAIR TAKE 1 TABLET BY MOUTH EVERYDAY AT BEDTIME   Semaglutide(0.25 or  0.5MG/DOS) 2 MG/1.5ML Sopn Commonly known as:  OZEMPIC (0.25 OR 0.5 MG/DOSE) Inject 0.25 mg into the skin once a week.   triamcinolone cream 0.1 % Commonly known as:  KENALOG Apply topically daily as needed.       Past Surgical History:  He  has a past surgical history that includes Colonoscopy (last one 06-07-2011); Prostate biopsy (05/15/12); Tonsillectomy (as child); Laminectomy and microdiscectomy lumbar spine (12-23-2003); LAPAROSCOPIC BILATERAL INGUINAL HERNIA REPAIR/  UMBILICAL HERNIA REPAIR WITH MESH/  ASPIRATION LEFT HYDROCELE (07-11-2012); TEE without cardioversion (N/A, 05/07/2014); Cardioversion (N/A, 05/07/2014); NM MYOVIEW LTD (05/18/2014); Cardioversion (N/A, 09/09/2014); Hydrocele surgery (Bilateral, 09/26/2015);  transthoracic echocardiogram (11/'18; 1/'19); Cataract extraction w/ intraocular lens  implant, bilateral (08 and 09/  2018); Transurethral resection of prostate (N/A, 05/30/2017); Gold seed implant (N/A, 09/09/2017); and SPACE OAR INSTILLATION (N/A, 09/09/2017).  Family History:  His family history includes Breast cancer in his mother; Ovarian cancer in his sister; Stroke in an other family member.  Social History:  He  reports that he has been smoking cigarettes. He has a 15.00 pack-year smoking history. He has never used smokeless tobacco. He reports current alcohol use of about 14.0 standard drinks of alcohol per week. He reports that he does not use drugs.

## 2018-05-12 NOTE — Patient Instructions (Signed)
Can try changing symbicort to one puff in the morning and one puff at night  Follow up in 6 months

## 2018-05-21 ENCOUNTER — Other Ambulatory Visit: Payer: Self-pay

## 2018-05-21 ENCOUNTER — Ambulatory Visit (INDEPENDENT_AMBULATORY_CARE_PROVIDER_SITE_OTHER): Payer: Federal, State, Local not specified - PPO | Admitting: Family Medicine

## 2018-05-21 ENCOUNTER — Encounter: Payer: Self-pay | Admitting: Family Medicine

## 2018-05-21 VITALS — BP 116/74 | HR 77 | Temp 98.0°F | Ht 71.0 in | Wt 237.6 lb

## 2018-05-21 DIAGNOSIS — I10 Essential (primary) hypertension: Secondary | ICD-10-CM | POA: Diagnosis not present

## 2018-05-21 DIAGNOSIS — I480 Paroxysmal atrial fibrillation: Secondary | ICD-10-CM

## 2018-05-21 DIAGNOSIS — E1140 Type 2 diabetes mellitus with diabetic neuropathy, unspecified: Secondary | ICD-10-CM

## 2018-05-21 DIAGNOSIS — K760 Fatty (change of) liver, not elsewhere classified: Secondary | ICD-10-CM | POA: Diagnosis not present

## 2018-05-21 LAB — POCT GLYCOSYLATED HEMOGLOBIN (HGB A1C): Hemoglobin A1C: 7.1 % — AB (ref 4.0–5.6)

## 2018-05-21 NOTE — Progress Notes (Signed)
Subjective:     Patient ID: Ronald [name], male   DOB: 04-Dec-1945, 73 y.o.   MRN: [id number]  HPI Patient here to discuss diabetes issues.  Refer to recent note.  He had some recent weight gain and ultrasound showing fatty liver changes.  We had recommended discontinuation of sulfonylurea and started Ozempic.  He has had some early satiety as expected with the Ozempic but otherwise tolerating well.  No nausea.  He has noted some mild increase in blood sugars fasting but has lost about 4 pounds so far.  He is on the lowest dose and will titrate up to 0.5 starting this Friday.  He does feel like he has reduced his overall calorie intake by mostly reducing portion sizes.  Other chronic problems include history of A. fib, hypertension, systolic and diastolic heart failure, obstructive sleep apnea, history of prostate cancer, metabolic syndrome.  His recent renal function was stable.  TSH normal.  Hepatic function normal.  Mains on anticoagulation with Eliquis.  No recent bleeding complications.  Past Medical History:  Diagnosis Date  . Allergic rhinitis   . Anticoagulant long-term use    eliquis  . Cardiomyopathy due to systemic disease [facility])    followed by dr harding  . COPD with emphysema [facility])    pulmologist-  dr Halford Chessman  . Dyspnea    occasional per pt  . History of colon polyps    tubular adenoma 2013  . History of gout    09-05-2017 last flare-up  05/ 2019 3 wks ago, feet  . History of sepsis 02/18/2017   per d/c note probable uti, acute chf, acute renal failure, hypoxia  . Hyperlipidemia   . Hyperplasia of prostate with lower urinary tract symptoms (LUTS)   . Hypertension   . OSA on CPAP    per study 08-03-2004  Severe OSA  . Persistent atrial fibrillation    cardiologist --  dr Dorris Carnes--  post cardioversion 05-07-2014  . Prostate cancer [facility]) urologsit-  dr Alyson Ingles--- as of 05-21-2017 per pt last PSA 11 approx.   Dx  2014--  stage T1c, Gleason 3+3=6, PSA 6.67--  Active  surveillance/  04/ 2019  Stage T1b, Gleason 3+4, PSA 12.8- plan external radiation therapy    . 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 ([place])    pulmologist-  dr Halford Chessman  . Seasonal allergies   . Sigmoid diverticulosis   . Systolic and diastolic CHF, chronic [facility])    cardiologist-  [name]  . Tinea versicolor   . Type 2 diabetes mellitus ([place])   . Wears hearing aid in both ears    Past Surgical History:  Procedure Laterality Date  . CARDIOVERSION N/A 05/07/2014   Procedure: CARDIOVERSION;  Surgeon: [name], MD;  Location: [facility] ENDOSCOPY;  Service: Cardiovascular;  Laterality: N/A;  . CARDIOVERSION N/A 09/09/2014   Procedure: CARDIOVERSION;  Surgeon: Lelon Perla, MD;  Location: [facility] ENDOSCOPY;  Service: Cardiovascular;  Laterality: N/A;  successfully  . CATARACT EXTRACTION W/ INTRAOCULAR LENS  IMPLANT, BILATERAL  08 and 09/  2018  . COLONOSCOPY  last one 06-07-2011  . GOLD SEED IMPLANT N/A 09/09/2017   Procedure: GOLD SEED IMPLANT;  Surgeon: [name], MD;  Location: [facility];  Service: Urology;  Laterality: N/A;  . HYDROCELE EXCISION Bilateral 09/26/2015   Procedure: HYDROCELECTOMY ADULT;  Surgeon: [name], MD;  [facility];  Service: Urology;  Laterality: Bilateral;  . LAMINECTOMY AND MICRODISCECTOMY LUMBAR SPINE  12-23-2003   Left L5 -- S1  . LAPAROSCOPIC BILATERAL INGUINAL HERNIA REPAIR/  UMBILICAL HERNIA REPAIR WITH MESH/  ASPIRATION LEFT HYDROCELE  07-11-2012  . NM MYOVIEW LTD  05/18/2014   Low risk study. Normal perfusion: No ischemia or infarction. Mild LV dysfunction - 46% (does not correlate with echocardiographic EF of 50-55%)  . PROSTATE BIOPSY  05/15/12   Clinically both Lobes  . SPACE OAR INSTILLATION N/A 09/09/2017   Procedure: SPACE OAR INSTILLATION;  Surgeon: [name], MD;  [facility];  Service: Urology;  Laterality: N/A;  . TEE WITHOUT CARDIOVERSION  N/A 05/07/2014   Procedure: TRANSESOPHAGEAL ECHOCARDIOGRAM (TEE);  Surgeon: [name], MD;  Location: [facility] ENDOSCOPY;  Service: Cardiovascular;  Laterality: N/A;   mild atherosclerosis plaque of aorta,  mild AR, MR, and TR,  no cardiac source of emboli  . TONSILLECTOMY  as child  . TRANSTHORACIC ECHOCARDIOGRAM  11/'18; 1/'19   a) In setting of sepsis: EF of 40-45%.  Diffuse hypokinesis.  No RWMA.  Biatrial enlargement.;; b) ** f/u Jan 2019: Normal LVF 55-60%** , mildly dilated aortic root(38 mm) and ascending aorta (44 mm). Compared to prior echo, LVEF has improved.  . TRANSURETHRAL RESECTION OF PROSTATE N/A 05/30/2017   Procedure: TRANSURETHRAL RESECTION OF THE PROSTATE (TURP);  Surgeon: [name], MD;  Location: [facility];  Service: Urology;  Laterality: N/A;    reports that he has been smoking cigarettes. He has a 15.00 pack-year smoking history. He has never used smokeless tobacco. He reports current alcohol use of about 14.0 standard drinks of alcohol per week. He reports that he does not use drugs. family history includes Breast cancer in his mother; Ovarian cancer in his sister; Stroke in an other family member. No Known Allergies   Review of Systems  Constitutional: Negative for fatigue.  Eyes: Negative for visual disturbance.  Respiratory: Negative for cough, chest tightness and shortness of breath.   Cardiovascular: Negative for chest pain, palpitations and leg swelling.  Gastrointestinal: Negative for abdominal pain, nausea and vomiting.  Endocrine: Negative for polydipsia and polyuria.  Genitourinary: Negative for dysuria.  Neurological: Negative for dizziness, syncope, weakness, light-headedness and headaches.       Objective:   Physical Exam Constitutional:      Appearance: He is well-developed.  HENT:     Right Ear: External ear normal.     Left Ear: External ear normal.  Eyes:     Pupils: Pupils are equal, round, and reactive to light.  Neck:      Musculoskeletal: Neck supple.     Thyroid: No thyromegaly.  Cardiovascular:     Rate and Rhythm: Normal rate.  Pulmonary:     Effort: Pulmonary effort is normal. No respiratory distress.     Breath sounds: Normal breath sounds. No wheezing or rales.  Musculoskeletal:     Right lower leg: No edema.     Left lower leg: No edema.  Neurological:     Mental Status: He is alert and oriented to person, place, and time.        Assessment:     # 1 Type 2 diabetes.  Stable with A1c 7.1%.  Recent change in medications as above.  He has not yet finished titration of Ozempic.  Hopefully will have some additional weight loss as he goes up to a higher dose  #2 hypertension stable and at goal  #3 chronic atrial fibrillation on Eliquis and rate controlled  #4 hepatic steatosis by  recent ultrasound.  Recent liver transaminases normal.  No evidence for hepatic dysfunction    Plan:     -Continue titration of Ozempic.  Hopefully we will see some additional A1c lowering with weight loss. -We will try to step up his exercise.  He is contemplating joining a gym.  He is currently doing some walking. -We will move back his follow-up for about 3 months from now and recheck A1c at that point  [name] MD [facility]

## 2018-06-17 ENCOUNTER — Ambulatory Visit: Payer: Self-pay

## 2018-06-17 NOTE — Telephone Encounter (Signed)
Please see messages.

## 2018-06-17 NOTE — Telephone Encounter (Signed)
Called patient and he does have an appointment in the morning at 9:15am.

## 2018-06-17 NOTE — Telephone Encounter (Signed)
Pt wife called to state that her husbands BS has been running over 300 in the afternoons. Today 344 at around 1430. This AM his BS fasting was 193.  She says his medication has recently been changed to the Ozempic weekly.  His last dose was Sunday. She states that he just says he does not feel good.  He sees halos around objects. He has had no vomiting or SOB. Per protocol call was placed to office FC Sheena. Will route conversation back to office per Sheena\'s request. Care advice read to patients wife Sandy.  She verbalized understanding of all instructions. Reason for Disposition . [1] Blood glucose > 300 mg/dL (16.7 mmol/L) AND [2] two or more times in a row  Answer Assessment - Initial Assessment Questions 1. BLOOD GLUCOSE: "What is your blood glucose level?"      344 and fasting 193 this am 2. ONSET: "When did you check the blood glucose?"     today 3. USUAL RANGE: "What is your glucose level usually?" (e.g., usual fasting morning value, usual evening value)     19 3 today  Usual 240 Ozempic in am 4. KETONES: "Do you check for ketones (urine or blood test strips)?" If yes, ask: "What does the test show now?"      no 5. TYPE 1 or 2:  "Do you know what type of diabetes you have?"  (e.g., Type 1, Type 2, Gestational; doesn't know)      Type 2 6. INSULIN: "Do you take insulin?" "What type of insulin(s) do you use? What is the mode of delivery? (syringe, pen (e.g., injection or  pump)?"      Ozempic this Sunday 7. DIABETES PILLS: "Do you take any pills for your diabetes?" If yes, ask: "Have you missed taking any pills recently?"     none 8. OTHER SYMPTOMS: "Do you have any symptoms?" (e.g., fever, frequent urination, difficulty breathing, dizziness, weakness, vomiting)     No has halo to eyes 9. PREGNANCY: "Is there any chance you are pregnant?" "When was your last menstrual period?"    N/A  Protocols used: DIABETES - HIGH BLOOD SUGAR-A-AH

## 2018-06-17 NOTE — Telephone Encounter (Signed)
Recommend follow up tomorrow.   We may need to start some insulin.

## 2018-06-18 ENCOUNTER — Ambulatory Visit (INDEPENDENT_AMBULATORY_CARE_PROVIDER_SITE_OTHER): Payer: Federal, State, Local not specified - PPO | Admitting: Family Medicine

## 2018-06-18 ENCOUNTER — Other Ambulatory Visit: Payer: Self-pay

## 2018-06-18 ENCOUNTER — Encounter: Payer: Self-pay | Admitting: Family Medicine

## 2018-06-18 VITALS — BP 110/74 | HR 78 | Temp 98.1°F | Ht 71.0 in | Wt 234.2 lb

## 2018-06-18 DIAGNOSIS — E1165 Type 2 diabetes mellitus with hyperglycemia: Secondary | ICD-10-CM | POA: Diagnosis not present

## 2018-06-18 DIAGNOSIS — I480 Paroxysmal atrial fibrillation: Secondary | ICD-10-CM

## 2018-06-18 DIAGNOSIS — I10 Essential (primary) hypertension: Secondary | ICD-10-CM

## 2018-06-18 MED ORDER — METFORMIN HCL 500 MG PO TABS: 500.0000 mg | ORAL_TABLET | Freq: Two times a day (BID) | ORAL | 3 refills | Status: DC

## 2018-06-18 NOTE — Progress Notes (Signed)
Subjective:     Patient ID: Ronald [name], male   DOB: January 25, 1946, 73 y.o.   MRN: [id number]  HPI Patient here for diabetes follow-up.  We recently started Ozempic and he is tolerating well.  Unfortunately his blood sugars have shot up.  He had fasting this morning 237 had several fastings over 200 and some postprandials over 300.  Recent A1c 7.1%.  Patient had been on glipizide and we discontinued that.  He has fatty liver changes and we were hoping to assist with weight loss with changing over to GLP-1 medication.  Patient had been on metformin but this was stopped a year ago when he had sepsis during his hospitalization and associated acute renal failure.Marland Kitchen  His most recent creatinine 1.38.  He did not have any intolerance with metformin.  Echocardiogram a year ago ejection fraction 55 to 60%  Past Medical History:  Diagnosis Date  . Allergic rhinitis   . Anticoagulant long-term use    eliquis  . Cardiomyopathy due to systemic disease [facility])    followed by dr harding  . COPD with emphysema [facility])    pulmologist-  dr Halford Chessman  . Dyspnea    occasional per pt  . History of colon polyps    tubular adenoma 2013  . History of gout    09-05-2017 last flare-up  05/ 2019 3 wks ago, feet  . History of sepsis 02/18/2017   per d/c note probable uti, acute chf, acute renal failure, hypoxia  . Hyperlipidemia   . Hyperplasia of prostate with lower urinary tract symptoms (LUTS)   . Hypertension   . OSA on CPAP    per study 08-03-2004  Severe OSA  . Persistent atrial fibrillation    cardiologist --  dr Dorris Carnes--  post cardioversion 05-07-2014  . Prostate cancer [facility]) urologsit-  dr Alyson Ingles--- as of 05-21-2017 per pt last PSA 11 approx.   Dx  2014--  stage T1c, Gleason 3+3=6, PSA 6.67--  Active surveillance/  04/ 2019  Stage T1b, Gleason 3+4, PSA 12.8- plan external radiation therapy    . 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 ([place])    pulmologist-  dr Halford Chessman  . Seasonal  allergies   . Sigmoid diverticulosis   . Systolic and diastolic CHF, chronic [facility])    cardiologist-  [name]  . Tinea versicolor   . Type 2 diabetes mellitus ([place])   . Wears hearing aid in both ears    Past Surgical History:  Procedure Laterality Date  . CARDIOVERSION N/A 05/07/2014   Procedure: CARDIOVERSION;  Surgeon: [name], MD;  Location: [facility] ENDOSCOPY;  Service: Cardiovascular;  Laterality: N/A;  . CARDIOVERSION N/A 09/09/2014   Procedure: CARDIOVERSION;  Surgeon: Lelon Perla, MD;  Location[facility] ENDOSCOPY;  Service: Cardiovascular;  Laterality: N/A;  successfully  . CATARACT EXTRACTION W/ INTRAOCULAR LENS  IMPLANT, BILATERAL  08 and 09/  2018  . COLONOSCOPY  last one 06-07-2011  . GOLD SEED IMPLANT N/A 09/09/2017   Procedure: GOLD SEED IMPLANT;  Surgeon: [name], MD;  Location: [facility];  Service: Urology;  Laterality: N/A;  . HYDROCELE EXCISION Bilateral 09/26/2015   Procedure: HYDROCELECTOMY ADULT;  Surgeon: [name], MD;  Location: [facility];  Service: Urology;  Laterality: Bilateral;  . LAMINECTOMY AND MICRODISCECTOMY LUMBAR SPINE  12-23-2003   Left L5 -- S1  . LAPAROSCOPIC BILATERAL INGUINAL HERNIA REPAIR/  UMBILICAL HERNIA REPAIR WITH MESH/  ASPIRATION LEFT HYDROCELE  07-11-2012  .  NM MYOVIEW LTD  05/18/2014   Low risk study. Normal perfusion: No ischemia or infarction. Mild LV dysfunction - 46% (does not correlate with echocardiographic EF of 50-55%)  . PROSTATE BIOPSY  05/15/12   Clinically both Lobes  . SPACE OAR INSTILLATION N/A 09/09/2017   Procedure: SPACE OAR INSTILLATION;  Surgeon: [name], MD;  Location: [facility];  Service: Urology;  Laterality: N/A;  . TEE WITHOUT CARDIOVERSION N/A 05/07/2014   Procedure: TRANSESOPHAGEAL ECHOCARDIOGRAM (TEE);  Surgeon: [name], MD;  Location: [facility] ENDOSCOPY;  Service: Cardiovascular;  Laterality: N/A;   mild atherosclerosis plaque of aorta,   mild AR, MR, and TR,  no cardiac source of emboli  . TONSILLECTOMY  as child  . TRANSTHORACIC ECHOCARDIOGRAM  11/'18; 1/'19   a) In setting of sepsis: EF of 40-45%.  Diffuse hypokinesis.  No RWMA.  Biatrial enlargement.;; b) ** f/u Jan 2019: Normal LVF 55-60%** , mildly dilated aortic root(38 mm) and ascending aorta (44 mm). Compared to prior echo, LVEF has improved.  . TRANSURETHRAL RESECTION OF PROSTATE N/A 05/30/2017   Procedure: TRANSURETHRAL RESECTION OF THE PROSTATE (TURP);  Surgeon: [name], MD;  Location: [facility];  Service: Urology;  Laterality: N/A;    reports that he has been smoking cigarettes. He has a 15.00 pack-year smoking history. He has never used smokeless tobacco. He reports current alcohol use of about 14.0 standard drinks of alcohol per week. He reports that he does not use drugs. family history includes Breast cancer in his mother; Ovarian cancer in his sister; Stroke in an other family member. No Known Allergies   Review of Systems  Constitutional: Negative for fatigue.  Eyes: Negative for visual disturbance.  Respiratory: Negative for cough, chest tightness and shortness of breath.   Cardiovascular: Negative for chest pain, palpitations and leg swelling.  Endocrine: Negative for polydipsia and polyuria.  Neurological: Negative for dizziness, syncope, weakness, light-headedness and headaches.       Objective:   Physical Exam Constitutional:      Appearance: He is well-developed.  HENT:     Right Ear: External ear normal.     Left Ear: External ear normal.  Eyes:     Pupils: Pupils are equal, round, and reactive to light.  Neck:     Musculoskeletal: Neck supple.     Thyroid: No thyromegaly.  Cardiovascular:     Rate and Rhythm: Normal rate and regular rhythm.  Pulmonary:     Effort: Pulmonary effort is normal. No respiratory distress.     Breath sounds: Normal breath sounds. No wheezing or rales.  Neurological:     Mental Status: He is alert and  oriented to person, place, and time.        Assessment:     #1 Type 2 diabetes poorly controlled by home readings recently.  Most recent A1c 7.1%.  Recent addition of Ozempic which he is tolerating well  #2 hypertension- stable.    #3 atrial fibrillation- on Eliquis.    Plan:     -Given the fact that his renal function has improved recently we recommended adding back metformin 500 mg twice daily for 1 to 2 weeks.  If fasting blood sugars consistently over 160 in 2 weeks increase further to metformin to tablets twice daily.  He has follow-up in May and recheck A1c then  [name] MD [facility] at [facility]

## 2018-06-18 NOTE — Patient Instructions (Signed)
Start back the Metformin 500 mg twice daily  After one week if fasting sugars > 160 then increase the metformin to two twice daily.

## 2018-06-20 ENCOUNTER — Other Ambulatory Visit: Payer: Self-pay | Admitting: Family Medicine

## 2018-06-20 ENCOUNTER — Ambulatory Visit: Payer: Medicare Other | Admitting: Family Medicine

## 2018-06-20 NOTE — Telephone Encounter (Signed)
Is this medication still discontinued? Or is he back on this medication?

## 2018-06-22 NOTE — Telephone Encounter (Signed)
He is off this med.

## 2018-06-25 NOTE — Telephone Encounter (Signed)
Rx denial sent to the pts pharmacy.

## 2018-06-26 ENCOUNTER — Other Ambulatory Visit: Payer: Self-pay | Admitting: Family Medicine

## 2018-07-15 ENCOUNTER — Other Ambulatory Visit: Payer: Self-pay | Admitting: Family Medicine

## 2018-07-30 DIAGNOSIS — G4733 Obstructive sleep apnea (adult) (pediatric): Secondary | ICD-10-CM | POA: Diagnosis not present

## 2018-08-08 ENCOUNTER — Other Ambulatory Visit: Payer: Self-pay | Admitting: Cardiology

## 2018-08-08 NOTE — Telephone Encounter (Signed)
carvedilol refilled.

## 2018-08-18 ENCOUNTER — Other Ambulatory Visit: Payer: Self-pay | Admitting: Family Medicine

## 2018-08-20 ENCOUNTER — Other Ambulatory Visit: Payer: Self-pay

## 2018-08-20 ENCOUNTER — Ambulatory Visit (INDEPENDENT_AMBULATORY_CARE_PROVIDER_SITE_OTHER): Payer: Federal, State, Local not specified - PPO | Admitting: Family Medicine

## 2018-08-20 ENCOUNTER — Encounter: Payer: Self-pay | Admitting: Family Medicine

## 2018-08-20 VITALS — BP 110/62 | HR 73 | Temp 98.2°F | Ht 71.0 in | Wt 234.5 lb

## 2018-08-20 DIAGNOSIS — E1165 Type 2 diabetes mellitus with hyperglycemia: Secondary | ICD-10-CM

## 2018-08-20 DIAGNOSIS — I480 Paroxysmal atrial fibrillation: Secondary | ICD-10-CM

## 2018-08-20 DIAGNOSIS — I10 Essential (primary) hypertension: Secondary | ICD-10-CM | POA: Diagnosis not present

## 2018-08-20 LAB — POCT GLYCOSYLATED HEMOGLOBIN (HGB A1C): Hemoglobin A1C: 8.1 % — AB (ref 4.0–5.6)

## 2018-08-20 MED ORDER — GLUCOSE BLOOD VI STRP: ORAL_STRIP | 3 refills | Status: DC

## 2018-08-20 MED ORDER — SEMAGLUTIDE(0.25 OR 0.5MG/DOS) 2 MG/1.5ML ~~LOC~~ SOPN: 0.5000 mg | PEN_INJECTOR | SUBCUTANEOUS | 3 refills | Status: DC

## 2018-08-20 NOTE — Patient Instructions (Signed)
Increase the Metformin to two twice daily and let's plan on 3 month follow.up.

## 2018-08-20 NOTE — Progress Notes (Signed)
Subjective:     Patient ID: Ronald [name], male   DOB: 1945-12-15, 73 y.o.   MRN: [id number]  HPI Patient here for medical follow-up  Type 2 diabetes.  We added Ozempic recently.  He did have some initial mild modest weight loss but this seems to have leveled out.  Last A1c 7.1%.  Also takes metformin 500 mg twice daily.  This was recently added back.  He had some kidney injury over a year ago and metformin was discontinued at that time.  Recent fasting blood sugars usually ranging 160-180.  He has made some dietary changes with trying to reduce carbs though he has some ways to go.  His other problems include hypertension, atrial fibrillation, history of combined heart failure, obstructive sleep apnea, hyperlipidemia, metabolic syndrome.  He is not getting any regular exercise.  Denies any chest pains.  Remains on Eliquis  Past Medical History:  Diagnosis Date  . Allergic rhinitis   . Anticoagulant long-term use    eliquis  . Cardiomyopathy due to systemic disease [facility])    followed by dr harding  . COPD with emphysema [facility])    pulmologist-  dr Halford Chessman  . Dyspnea    occasional per pt  . History of colon polyps    tubular adenoma 2013  . History of gout    09-05-2017 last flare-up  05/ 2019 3 wks ago, feet  . History of sepsis 02/18/2017   per d/c note probable uti, acute chf, acute renal failure, hypoxia  . Hyperlipidemia   . Hyperplasia of prostate with lower urinary tract symptoms (LUTS)   . Hypertension   . OSA on CPAP    per study 08-03-2004  Severe OSA  . Persistent atrial fibrillation    cardiologist --  dr Dorris Carnes--  post cardioversion 05-07-2014  . Prostate cancer [facility]) urologsit-  dr Alyson Ingles--- as of 05-21-2017 per pt last PSA 11 approx.   Dx  2014--  stage T1c, Gleason 3+3=6, PSA 6.67--  Active surveillance/  04/ 2019  Stage T1b, Gleason 3+4, PSA 12.8- plan external radiation therapy    . 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 Seasonal allergies   . Sigmoid diverticulosis   . Systolic and diastolic CHF, chronic [facility])    cardiologist-  [name]  . Tinea versicolor   . Type 2 diabetes mellitus ([place])   . Wears hearing aid in both ears    Past Surgical History:  Procedure Laterality Date  . CARDIOVERSION N/A 05/07/2014   Procedure: CARDIOVERSION;  Surgeon: [name], MD;  Location: [facility] ENDOSCOPY;  Service: Cardiovascular;  Laterality: N/A;  . CARDIOVERSION N/A 09/09/2014   Procedure: CARDIOVERSION;  Surgeon: Lelon Perla, MD;  Location: [facility] ENDOSCOPY;  Service: Cardiovascular;  Laterality: N/A;  successfully  . CATARACT EXTRACTION W/ INTRAOCULAR LENS  IMPLANT, BILATERAL  08 and 09/  2018  . COLONOSCOPY  last one 06-07-2011  . GOLD SEED IMPLANT N/A 09/09/2017   Procedure: GOLD SEED IMPLANT;  Surgeon: [name], MD;  Location: [facility];  Service: Urology;  Laterality: N/A;  . HYDROCELE EXCISION Bilateral 09/26/2015   Procedure: HYDROCELECTOMY ADULT;  Surgeon: [name], MD;  Location: [facility];  Service: Urology;  Laterality: Bilateral;  . LAMINECTOMY AND MICRODISCECTOMY LUMBAR SPINE  12-23-2003   Left L5 -- S1  . LAPAROSCOPIC BILATERAL INGUINAL HERNIA REPAIR/  UMBILICAL HERNIA REPAIR WITH MESH/  ASPIRATION LEFT HYDROCELE  07-11-2012  . NM MYOVIEW LTD  05/18/2014   Low risk study. Normal perfusion: No ischemia or infarction. Mild LV dysfunction - 46% (does not correlate with echocardiographic EF of 50-55%)  . PROSTATE BIOPSY  05/15/12   Clinically both [place]  . SPACE OAR INSTILLATION N/A 09/09/2017   Procedure: SPACE OAR INSTILLATION;  Surgeon: [name], MD;  Location: [facility];  Service: Urology;  Laterality: N/A;  . TEE WITHOUT CARDIOVERSION N/A 05/07/2014   Procedure: TRANSESOPHAGEAL ECHOCARDIOGRAM (TEE);  Surgeon: [name], MD;  Location: [facility] ENDOSCOPY;  Service: Cardiovascular;  Laterality: N/A;   mild  atherosclerosis plaque of aorta,  mild AR, MR, and TR,  no cardiac source of emboli  . TONSILLECTOMY  as child  . TRANSTHORACIC ECHOCARDIOGRAM  11/'18; 1/'19   a) In setting of sepsis: EF of 40-45%.  Diffuse hypokinesis.  No RWMA.  Biatrial enlargement.;; b) ** f/u Jan 2019: Normal LVF 55-60%** , mildly dilated aortic root(38 mm) and ascending aorta (44 mm). Compared to prior echo, LVEF has improved.  . TRANSURETHRAL RESECTION OF PROSTATE N/A 05/30/2017   Procedure: TRANSURETHRAL RESECTION OF THE PROSTATE (TURP);  Surgeon: [name], MD;  Location: [facility];  Service: Urology;  Laterality: N/A;    reports that he has been smoking cigarettes. He has a 15.00 pack-year smoking history. He has never used smokeless tobacco. He reports current alcohol use of about 14.0 standard drinks of alcohol per week. He reports that he does not use drugs. family history includes Breast cancer in his mother; Ovarian cancer in his sister; Stroke in an other family member. No Known Allergies   Review of Systems  Constitutional: Negative for chills, fatigue and unexpected weight change.  Eyes: Negative for visual disturbance.  Respiratory: Negative for cough, chest tightness and shortness of breath.   Cardiovascular: Negative for chest pain, palpitations and leg swelling.  Endocrine: Negative for polydipsia and polyuria.  Genitourinary: Negative for dysuria.  Neurological: Negative for dizziness, syncope, weakness, light-headedness and headaches.       Objective:   Physical Exam Constitutional:      Appearance: He is well-developed.  HENT:     Right Ear: External ear normal.     Left Ear: External ear normal.  Eyes:     Pupils: Pupils are equal, round, and reactive to light.  Neck:     Musculoskeletal: Neck supple.     Thyroid: No thyromegaly.  Cardiovascular:     Rate and Rhythm: Normal rate and regular rhythm.  Pulmonary:     Effort: Pulmonary effort is normal. No respiratory distress.      Breath sounds: Normal breath sounds. No wheezing or rales.  Neurological:     Mental Status: He is alert and oriented to person, place, and time.        Assessment:     #1 type 2 diabetes poorly controlled with A1c today 8.1%  #2 hypertension stable  #3 chronic atrial fibrillation-on Eliquis    Plan:     -Increase metformin to 500 mg 2 twice daily -Refill Ozempic.  Consider further titration to 1 mg dose if not closer to goal in 3 months at follow-up -Refill test strips -Challenged him to try to get out and walk and exercise more  [name] MD [facility]

## 2018-08-29 ENCOUNTER — Other Ambulatory Visit: Payer: Self-pay

## 2018-08-29 ENCOUNTER — Telehealth: Payer: Self-pay | Admitting: Family Medicine

## 2018-08-29 MED ORDER — METFORMIN HCL 500 MG PO TABS: 1000.0000 mg | ORAL_TABLET | Freq: Two times a day (BID) | ORAL | 0 refills | Status: DC

## 2018-08-29 NOTE — Telephone Encounter (Signed)
Copied from [place] [id number]. Topic: Quick Communication - Rx Refill/Question >> Aug 29, 2018 12:16 PM Burchel, Abbi R wrote: Medication:  metFORMIN (GLUCOPHAGE) 500 MG tablet  Pt states he needs a new rx for this medication bc Dr Elease Hashimoto has increased dosage to: (2) 500 MG tablets twice daily.   Preferred Pharmacy [id number] [place], [address]  [phone] (Phone) [phone] (Fax)   Agent: Please be advised that RX refills may take up to 3 business days. We ask that you follow-up with your pharmacy.

## 2018-08-29 NOTE — Telephone Encounter (Signed)
Med[facility] with the correct dosage [facility] for the patient.

## 2018-09-09 DIAGNOSIS — C61 Malignant neoplasm of prostate: Secondary | ICD-10-CM | POA: Diagnosis not present

## 2018-09-16 DIAGNOSIS — C61 Malignant neoplasm of prostate: Secondary | ICD-10-CM | POA: Diagnosis not present

## 2018-09-16 DIAGNOSIS — R351 Nocturia: Secondary | ICD-10-CM | POA: Diagnosis not present

## 2018-09-16 DIAGNOSIS — N401 Enlarged prostate with lower urinary tract symptoms: Secondary | ICD-10-CM | POA: Diagnosis not present

## 2018-09-22 ENCOUNTER — Other Ambulatory Visit: Payer: Self-pay | Admitting: Family Medicine

## 2018-10-01 ENCOUNTER — Telehealth: Payer: Self-pay

## 2018-10-01 NOTE — Telephone Encounter (Signed)
Virtual Visit Pre-Appointment Phone Call  "[name] Resides, I am calling you today to discuss your upcoming appointment. We are currently trying to limit exposure to the virus that causes COVID-19 by seeing patients at home rather than in the office."  1. "What is the BEST phone number to call the day of the visit?" - include this in appointment notes  2. "Do you have or have access to (through a family member/friend) a smartphone with video capability that we can use for your visit?" a. If yes - list this number in appt notes as "cell" (if different from BEST phone #) and list the appointment type as a VIDEO visit in appointment notes b. If no - list the appointment type as a PHONE visit in appointment notes  3. Confirm consent - "In the setting of the current Covid19 crisis, you are scheduled for a (phone or video) visit with your provider on (date) at (time).  Just as we do with many in-office visits, in order for you to participate in this visit, we must obtain consent.  If you'd like, I can send this to your mychart (if signed up) or email for you to review.  Otherwise, I can obtain your verbal consent now.  All virtual visits are billed to your insurance company just like a normal visit would be.  By agreeing to a virtual visit, we'd like you to understand that the technology does not allow for your provider to perform an examination, and thus may limit your provider's ability to fully assess your condition. If your provider identifies any concerns that need to be evaluated in person, we will make arrangements to do so.  Finally, though the technology is pretty good, we cannot assure that it will always work on either your or our end, and in the setting of a video visit, we may have to convert it to a phone-only visit.  In either situation, we cannot ensure that we have a secure connection.  Are you willing to proceed?"Patient provided verbal consent. 4. Advise patient to be prepared - "Two  hours prior to your appointment, go ahead and check your blood pressure, pulse, oxygen saturation, and your weight (if you have the equipment to check those) and write them all down. When your visit starts, your provider will ask you for this information. If you have an Apple Watch or Kardia device, please plan to have heart rate information ready on the day of your appointment. Please have a pen and paper handy nearby the day of the visit as well."  5. Give patient instructions for MyChart download to smartphone OR Doximity/Doxy.me as below if video visit (depending on what platform provider is using)  6. Inform patient they will receive a phone call 15 minutes prior to their appointment time (may be from unknown caller ID) so they should be prepared to answer    TELEPHONE CALL NOTE  ANDRA MATSUO has been deemed a candidate for a follow-up tele-health visit to limit community exposure during the Covid-19 pandemic. I spoke with the patient via phone to ensure availability of phone/video source, confirm preferred email & phone number, and discuss instructions and expectations.  I reminded LINKON SIVERSON to be prepared with any vital sign and/or heart rhythm information that could potentially be obtained via home monitoring, at the time of his visit. I reminded JERELLE VIRDEN to expect a phone call prior to his visit.  Mitzie Na, [place] 10/01/2018 9:56 AM  INSTRUCTIONS FOR DOWNLOADING THE MYCHART APP TO SMARTPHONE  - The patient must first make sure to have activated MyChart and know their login information - If Apple, go to [employer] and type in MyChart in the search bar and download the app. If Android, ask patient to go to [employer] and type in [place] in the search bar and download the app. The app is free but as with any other app downloads, their phone may require them to verify saved payment information or Apple/Android password.  - The patient will need to then log into  the app with their MyChart username and password, and select [facility] as their healthcare provider to link the account. When it is time for your visit, go to the MyChart app, find appointments, and click Begin Video Visit. Be sure to Select Allow for your device to access the Microphone and Camera for your visit. You will then be connected, and your provider will be with you shortly.  **If they have any issues connecting, or need assistance please contact MyChart service desk [phone])**  **If using a computer, in order to ensure the best quality for their visit they will need to use either of the following Internet Browsers: [employer], or Google Chrome**  IF USING DOXIMITY or DOXY.ME - The patient will receive a link just prior to their visit by text.     FULL LENGTH CONSENT FOR TELE-HEALTH VISIT   I hereby voluntarily request, consent and authorize [place] and its employed or contracted physicians, physician assistants, nurse practitioners or other licensed health care professionals (the Practitioner), to provide me with telemedicine health care services (the "Services") as deemed necessary by the treating Practitioner. I acknowledge and consent to receive the Services by the Practitioner via telemedicine. I understand that the telemedicine visit will involve communicating with the Practitioner through live audiovisual communication technology and the disclosure of certain medical information by electronic transmission. I acknowledge that I have been given the opportunity to request an in-person assessment or other available alternative prior to the telemedicine visit and am voluntarily participating in the telemedicine visit.  I understand that I have the right to withhold or withdraw my consent to the use of telemedicine in the course of my care at any time, without affecting my right to future care or treatment, and that the Practitioner or I may terminate the  telemedicine visit at any time. I understand that I have the right to inspect all information obtained and/or recorded in the course of the telemedicine visit and may receive copies of available information for a reasonable fee.  I understand that some of the potential risks of receiving the Services via telemedicine include:  Marland Kitchen Delay or interruption in medical evaluation due to technological equipment failure or disruption; . Information transmitted may not be sufficient (e.g. poor resolution of images) to allow for appropriate medical decision making by the Practitioner; and/or  . In rare instances, security protocols could fail, causing a breach of personal health information.  Furthermore, I acknowledge that it is my responsibility to provide information about my medical history, conditions and care that is complete and accurate to the best of my ability. I acknowledge that Practitioner's advice, recommendations, and/or decision may be based on factors not within their control, such as incomplete or inaccurate data provided by me or distortions of diagnostic images or specimens that may result from electronic transmissions. I understand that the practice of medicine is not an exact science  and that Practitioner makes no warranties or guarantees regarding treatment outcomes. I acknowledge that I will receive a copy of this consent concurrently upon execution via email to the email address I last provided but may also request a printed copy by calling the office of [place].    I understand that my insurance will be billed for this visit.   I have read or had this consent read to me. . I understand the contents of this consent, which adequately explains the benefits and risks of the Services being provided via telemedicine.  . I have been provided ample opportunity to ask questions regarding this consent and the Services and have had my questions answered to my satisfaction. . I give my informed  consent for the services to be provided through the use of telemedicine in my medical care  By participating in this telemedicine visit I agree to the above.

## 2018-10-01 NOTE — Telephone Encounter (Signed)
Pt called regarding vm I left. Change pt appointment back into a office visit. Communicated with the Pt that face covering is required before entering lobby, also communicated with pt that no one is allowed in the lobby area or exam room besides the patient being seen that day.

## 2018-10-03 ENCOUNTER — Telehealth: Payer: Self-pay | Admitting: Cardiology

## 2018-10-03 NOTE — Telephone Encounter (Signed)

## 2018-10-06 ENCOUNTER — Telehealth (INDEPENDENT_AMBULATORY_CARE_PROVIDER_SITE_OTHER): Payer: Medicare Other | Admitting: Cardiology

## 2018-10-06 ENCOUNTER — Encounter: Payer: Self-pay | Admitting: Cardiology

## 2018-10-06 VITALS — Ht 71.0 in | Wt 231.0 lb

## 2018-10-06 DIAGNOSIS — I10 Essential (primary) hypertension: Secondary | ICD-10-CM

## 2018-10-06 DIAGNOSIS — R0609 Other forms of dyspnea: Secondary | ICD-10-CM

## 2018-10-06 DIAGNOSIS — R0600 Dyspnea, unspecified: Secondary | ICD-10-CM

## 2018-10-06 DIAGNOSIS — Z720 Tobacco use: Secondary | ICD-10-CM | POA: Diagnosis not present

## 2018-10-06 DIAGNOSIS — E785 Hyperlipidemia, unspecified: Secondary | ICD-10-CM

## 2018-10-06 DIAGNOSIS — Z79899 Other long term (current) drug therapy: Secondary | ICD-10-CM

## 2018-10-06 DIAGNOSIS — I480 Paroxysmal atrial fibrillation: Secondary | ICD-10-CM

## 2018-10-06 DIAGNOSIS — I5042 Chronic combined systolic (congestive) and diastolic (congestive) heart failure: Secondary | ICD-10-CM

## 2018-10-06 NOTE — Patient Instructions (Addendum)
Medication Instructions:  NO CHANGES   If you need a refill on your cardiac medications before your next appointment, please call your pharmacy.   Lab work:   LABS DUE IN SEPT 2020 -- SED RATE ,  C-REACTIVE PROTEIN ( CRP)  CAN DO THE SAME DAY  PULMONARY FUNCTIONS TEST IS DONE .  If you have labs (blood work) drawn today and your tests are completely normal, you will receive your results only by: Marland Kitchen MyChart Message (if you have MyChart) OR . A paper copy in the mail If you have any lab test that is abnormal or we need to change your treatment, we will call you to review the results.  Testing/Procedures: WILL BE SCHEDULE IN SEPT 2020 AT [facility] Your physician has recommended that you have a pulmonary function test. Pulmonary Function Tests are a group of tests that measure how well air moves in and out of your lungs.   Follow-Up: At [facility], you and your health needs are our priority.  As part of our continuing mission to provide you with exceptional heart care, we have created designated Provider Care Teams.  These Care Teams include your primary Cardiologist (physician) and Advanced Practice Providers (APPs -  Physician Assistants and Nurse Practitioners) who all work together to provide you with the care you need, when you need it. . You will need a follow up appointment in  6 months [place].  Please call our office 2 months in advance to schedule this appointment.  You may see Glenetta Hew, MD or one of the following Advanced Practice Providers on your designated Care Team:   . Rosaria Ferries, PA-C . Jory Sims, DNP, ANP  Any Other Special Instructions Will Be Listed Below (If Applicable).

## 2018-10-06 NOTE — Assessment & Plan Note (Signed)
Due for f/u PFTs with ESR & CRP. PCP checks LFTs & TSH. Needs annual Eye exam

## 2018-10-06 NOTE — Progress Notes (Signed)
Virtual Visit via Telephone Note   This visit type was conducted due to national recommendations for restrictions regarding the COVID-19 Pandemic (e.g. social distancing) in an effort to limit this patient's exposure and mitigate transmission in our community.  Due to his co-morbid illnesses, this patient is at least at moderate risk for complications without adequate follow up.  This format is felt to be most appropriate for this patient at this time.  The patient did not have access to video technology/had technical difficulties with video requiring transitioning to audio format only (telephone).  All issues noted in this document were discussed and addressed.  No physical exam could be performed with this format.  Please refer to the patient's chart for his  consent to telehealth for [facility].   Patient has given verbal permission to conduct this visit via virtual appointment and to bill insurance 10/07/2018 12:02 PM     Evaluation Performed:  Follow-up visit  Date:  10/07/2018   ID:  Ronald [name], Ronald [name] 14-Nov-1945, MRN [id number]  Patient Location: Home Provider Location: Office  PCP:  [name], MD  Cardiologist:  Glenetta Hew, MD  Electrophysiologist:  None   Chief Complaint:  6 month f/u  History of Present Illness:    Ronald [name] is a 73 y.o. male with PMH notable for Persistent PAF who presents via audio/video conferencing for a telehealth visit today.  Ronald [name] was last seen in Sept 2019 - only a few short breakthrough Afib spells.   Interval History:  Besides being out of shape -not enough exercise with COVID. Still smoking - but did cut down a lot (almost quit) - but has relapsed.   Wgt is down 8-9 lb - but plateau'd - less rapid b/c not doing any exercise b/c COVID.  Not even going out to grocery store.  Energy level is also down with no exercise.    HR doing well - no known recurrence of Afib & rate better.   No bleeding issues.  Urology  exam OK (recovering from Prostate Ca)   Cardiovascular ROS: positive for - dyspnea on exertion and a little more feet numbness negative for - chest pain, edema, irregular heartbeat, orthopnea, palpitations, paroxysmal nocturnal dyspnea, rapid heart rate, shortness of breath or syncope/near syncope; TIA/ amaurosis fugax   Lung evaluation was "normal" - but notes improved breathing with less smoking?  The patient does not have symptoms concerning for COVID-19 infection (fever, chills, cough, or new shortness of breath).  The patient is practicing social distancing.  Daughter & family arrived early from [place] - therefore converted to [place] visit.  ROS:  Please see the history of present illness.    Review of Systems  Constitutional: Positive for weight loss (plateau). Negative for chills, fever and malaise/fatigue.       Had Flu in winter (Jan)  HENT: Negative for congestion and nosebleeds.   Respiratory: Positive for shortness of breath (only with exertion). Negative for cough.   Cardiovascular: Negative for chest pain and leg swelling.  Gastrointestinal: Negative for blood in stool, heartburn, melena and nausea.  Genitourinary: Negative for hematuria.  Musculoskeletal: Negative for joint pain and myalgias.  Neurological: Negative for dizziness, focal weakness and headaches.  Psychiatric/Behavioral: Negative for depression and memory loss. The patient is not nervous/anxious and does not have insomnia.     R> L foot neuropathy numbness.  Past Medical History:  Diagnosis Date  . Allergic rhinitis   . Anticoagulant long-term use  eliquis  . Cardiomyopathy due to systemic disease [facility])    followed by dr harding  . COPD with emphysema [facility])    pulmologist-  dr Halford Chessman  . Dyspnea    occasional per pt  . History of colon polyps    tubular adenoma 2013  . History of gout    09-05-2017 last flare-up  05/ 2019 3 wks ago, feet  . History of sepsis 02/18/2017   per d/c note  probable uti, acute chf, acute renal failure, hypoxia  . Hyperlipidemia   . Hyperplasia of prostate with lower urinary tract symptoms (LUTS)   . Hypertension   . OSA on CPAP    per study 08-03-2004  Severe OSA  . Persistent atrial fibrillation    cardiologist --  dr Dorris Carnes--  post cardioversion 05-07-2014  . Prostate cancer [facility]) urologsit-  dr Alyson Ingles--- as of 05-21-2017 per pt last PSA 11 approx.   Dx  2014--  stage T1c, Gleason 3+3=6, PSA 6.67--  Active surveillance/  04/ 2019  Stage T1b, Gleason 3+4, PSA 12.8- plan external radiation therapy    . 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 Seasonal allergies   . Sigmoid diverticulosis   . Systolic and diastolic CHF, chronic [facility])    cardiologist-  [name]  . Tinea versicolor   . Type 2 diabetes mellitus ([place])   . Wears hearing aid in both ears    Past Surgical History:  Procedure Laterality Date  . CARDIOVERSION N/A 05/07/2014   Procedure: CARDIOVERSION;  Surgeon: [name], MD;  Location: [facility] ENDOSCOPY;  Service: Cardiovascular;  Laterality: N/A;  . CARDIOVERSION N/A 09/09/2014   Procedure: CARDIOVERSION;  Surgeon: Lelon Perla, MD;  Location: [facility] ENDOSCOPY;  Service: Cardiovascular;  Laterality: N/A;  successfully  . CATARACT EXTRACTION W/ INTRAOCULAR LENS  IMPLANT, BILATERAL  08 and 09/  2018  . COLONOSCOPY  last one 06-07-2011  . GOLD SEED IMPLANT N/A 09/09/2017   Procedure: GOLD SEED IMPLANT;  Surgeon: [name], MD;  Location: [facility];  Service: Urology;  Laterality: N/A;  . HYDROCELE EXCISION Bilateral 09/26/2015   Procedure: HYDROCELECTOMY ADULT;  Surgeon: [name], MD;  Location: [facility];  Service: Urology;  Laterality: Bilateral;  . LAMINECTOMY AND MICRODISCECTOMY LUMBAR SPINE  12-23-2003   Left L5 -- S1  . LAPAROSCOPIC BILATERAL INGUINAL HERNIA REPAIR/  UMBILICAL HERNIA REPAIR WITH MESH/  ASPIRATION  LEFT HYDROCELE  07-11-2012  . NM MYOVIEW LTD  05/18/2014   Low risk study. Normal perfusion: No ischemia or infarction. Mild LV dysfunction - 46% (does not correlate with echocardiographic EF of 50-55%)  . PROSTATE BIOPSY  05/15/12   Clinically both [place]ob[place]  . SPACE OAR INSTILLATION N/A 09/09/2017   Procedure: SPACE OAR INSTILLATION;  Surgeon: [name], MD;  Location: [facility];  Service: Urology;  Laterality: N/A;  . TEE WITHOUT CARDIOVERSION N/A 05/07/2014   Procedure: TRANSESOPHAGEAL ECHOCARDIOGRAM (TEE);  Surgeon: [name], MD;  Location: [facility] ENDOSCOPY;  Service: Cardiovascular;  Laterality: N/A;   mild atherosclerosis plaque of aorta,  mild AR, MR, and TR,  no cardiac source of emboli  . TONSILLECTOMY  as child  . TRANSTHORACIC ECHOCARDIOGRAM  11/'18; 1/'19   a) In setting of sepsis: EF of 40-45%.  Diffuse hypokinesis.  No RWMA.  Biatrial enlargement.;; b) ** f/u Jan 2019: Normal LVF 55-60%** , mildly dilated aortic root(38 mm) and ascending  aorta (44 mm). Compared to prior echo, LVEF has improved.  . TRANSURETHRAL RESECTION OF PROSTATE N/A 05/30/2017   Procedure: TRANSURETHRAL RESECTION OF THE PROSTATE (TURP);  Surgeon: [name], MD;  Location: [facility];  Service: Urology;  Laterality: N/A;     Current Meds  Medication Sig  . acetaminophen (TYLENOL) 500 MG tablet Take 500 mg by mouth every 6 (six) hours as needed for mild pain or moderate pain.  Marland Kitchen albuterol (VENTOLIN HFA) 108 (90 Base) MCG/ACT inhaler Inhale 2 puffs into the lungs every 6 (six) hours as needed for wheezing or shortness of breath.  . ALPRAZolam (XANAX) 0.25 MG tablet Take 1 tablet (0.25 mg total) at bedtime as needed by mouth for anxiety or sleep.  Marland Kitchen amiodarone (PACERONE) 200 MG tablet TAKE 1 TABLET BY MOUTH EVERY DAY  . atorvastatin (LIPITOR) 10 MG tablet TAKE 1 TABLET BY MOUTH DAILY EACH EVENING  . azelastine (ASTELIN) 0.1 % nasal spray PLACE 1 SPRAY INTO BOTH NOSTRILS 2 (TWO) TIMES  DAILY. USE IN EACH NOSTRIL AS DIRECTED  . Blood Glucose Monitoring Suppl (ACCU-CHEK AVIVA PLUS) w/Device KIT Use blood glucose machine as instructed on your strips  . budesonide-formoterol (SYMBICORT) 160-4.5 MCG/ACT inhaler Inhale 2 puffs into the lungs 2 (two) times daily.  . carvedilol (COREG) 12.5 MG tablet TAKE 1 TABLET (12.5 MG TOTAL) BY MOUTH 2 (TWO) TIMES DAILY.  Marland Kitchen colchicine 0.6 MG tablet Two tabs at onset of gout flare and then one tab twice daily as needed (Patient taking differently: as needed. Two tabs at onset of gout flare and then one tab twice daily as needed)  . ELIQUIS 5 MG TABS tablet TAKE 1 TABLET TWICE A DAY  . furosemide (LASIX) 40 MG tablet TAKE 1-2 TABLETS DAILY AS NEEDED.  Marland Kitchen glucose blood (ACCU-CHEK AVIVA PLUS) test strip Test 2 times daily dx e11.9  . hydrocortisone (ANUSOL-HC) 2.5 % rectal cream Apply 2 (two) times daily topically. (Patient taking differently: Apply topically 2 (two) times daily as needed. )  . KLOR-CON M20 20 MEQ tablet TAKE 2 TABLETS BY MOUTH EVERY DAY  . Lancets (ACCU-CHEK SOFT TOUCH) lancets Check blood sugars once per day. DX: E11.9  . losartan (COZAAR) 25 MG tablet TAKE 1 TABLET BY MOUTH EVERY DAY  . metFORMIN (GLUCOPHAGE) 500 MG tablet Take 2 tablets (1,000 mg total) by mouth 2 (two) times daily with a meal.  . metoprolol tartrate (LOPRESSOR) 25 MG tablet Take  1 tablet if  you have breakthrough atrial fib with extra dose of 200 mg Amiodarone.  . montelukast (SINGULAIR) 10 MG tablet TAKE 1 TABLET BY MOUTH EVERYDAY AT BEDTIME  . Semaglutide,0.25 or 0.5MG/DOS, (OZEMPIC, 0.25 OR 0.5 MG/DOSE,) 2 MG/1.5ML SOPN Inject 0.5 mg into the skin once a week.  . triamcinolone cream (KENALOG) 0.1 % Apply topically daily as needed.     Allergies:   Patient has no known allergies.   Social History   Tobacco Use  . Smoking status: Current Some Day Smoker    Packs/day: 0.25    Years: 60.00    Pack years: 15.00    Types: Cigarettes    Last attempt to quit:  04/11/2018    Years since quitting: 0.4  . Smokeless tobacco: Never Used  . Tobacco comment: 09-05-2017 tried quitting smoking 11/ 2018 but started back at 0.75ppd from 1ppd  Substance Use Topics  . Alcohol use: Yes    Alcohol/week: 14.0 standard drinks    Types: 14 Standard drinks or equivalent per week  Comment: 2 drinks daily  . Drug use: No     Family Hx: The patient's family history includes Breast cancer in his mother; Ovarian cancer in his sister; Stroke in an other family member. There is no history of Colon cancer, Esophageal cancer, Rectal cancer, or Stomach cancer.   Prior CV studies:   The following studies were reviewed today: . No new study:  Labs/Other Tests and Data Reviewed:    EKG:  No ECG reviewed.  Recent Labs: 04/22/2018: ALT 27; BUN 11; Creatinine, Ser 1.38; Potassium 4.4; Sodium 137; TSH 3.92   Recent Lipid Panel Lab Results  Component Value Date/Time   CHOL 173 04/22/2018 12:10 PM   TRIG 352.0 (H) 04/22/2018 12:10 PM   HDL 44.70 04/22/2018 12:10 PM   CHOLHDL 4 04/22/2018 12:10 PM   LDLCALC 62 06/07/2016 10:48 AM   LDLDIRECT 69.0 04/22/2018 12:10 PM    Wt Readings from Last 3 Encounters:  10/06/18 231 lb (104.8 kg)  08/20/18 234 lb 8 oz (106.4 kg)  06/18/18 234 lb 3.2 oz (106.2 kg)     Objective:    Vital Signs:  Ht '5\' 11"'  (1.803 m)   Wt 231 lb (104.8 kg)   SpO2 98%   BMI 32.22 kg/m   PCP checks of BP 120s-130s/80s.  VITAL SIGNS:  reviewed GEN:  no acute distress RESPIRATORY:  non-labored NEURO:  alert and oriented x 3, no obvious focal deficit PSYCH:  normal affect  ASSESSMENT & PLAN:    Problem List Items Addressed This Visit    Tobacco abuse - Primary (Chronic)    He is done his best he can to quit smoking, but has not finally actually fully been able to quit.  He tried to quit back in January, but is back to smoking "a handful" of cigarettes a day.  He has definitely made great strides in his attempted smoking cessation, and  does notice an improvement with his exertional dyspnea, but has not yet ready to make final step.  I counseled him on potential options including Chantix etc.      Paroxysmal atrial fibrillation ([place]): CHA2DS2-VASc Score 3 - On Eliquis (Chronic)    Seems to be mostly maintaining sinus rhythm with amiodarone and Cardizem carvedilol.  No bleeding issues with Eliquis.  PFTs were a little bit concerning, normal sed rate and CRP.  We will recheck PFTs again along with ESR and CRP.  --Pending results of upcoming PFTs, may consider reducing daily dose of amiodarone to 1/2 tablet (100 mg) daily.      Relevant Orders   C-reactive protein   Sedimentation rate   Pulmonary Function Test   On amiodarone therapy (Chronic)    Due for f/u PFTs with ESR & CRP. PCP checks LFTs & TSH. Needs annual Eye exam      Relevant Orders   C-reactive protein   Sedimentation rate   Pulmonary Function Test   Hyperlipidemia with target LDL less than 70 (Chronic)    Remains stable on low-dose atorvastatin.  Both direct and calculated LDL are within goal.      Exertional dyspnea (Chronic)   Relevant Orders   C-reactive protein   Sedimentation rate   Pulmonary Function Test   Essential hypertension (Chronic)    I do not have his current blood pressure recordings for today, but per his report, his pressures have been okay.  His most recent blood pressure in PCPs office was well controlled.  Seems to be tolerating current dose of losartan  and carvedilol well.      Chronic combined systolic and diastolic CHF (congestive heart failure) (HCC) (Chronic)    EF improved on medical management with control of A. fib.  This is the remaining reason for her working to ensure rhythm control rate control. Is on stable dose of Lasix mostly only taking 1 tablet daily.  Rarely taking additional PRN doses.  He is on stable dose of carvedilol and losartan for now.         COVID-19 Education: The signs and symptoms of  COVID-19 were discussed with the patient and how to seek care for testing (follow up with PCP or arrange E-visit).   The importance of social distancing was discussed today.  Time:   Today, I have spent 18 minutes with the patient with telehealth technology discussing the above problems.  Additional 10 min chart review & charting.   Medication Adjustments/Labs and Tests Ordered: Current medicines are reviewed at length with the patient today.  Concerns regarding medicines are outlined above.  Medication Instructions: None  Tests Ordered: Orders Placed This Encounter  Procedures  . C-reactive protein  . Sedimentation rate  . Pulmonary Function Test    Medication Changes: No orders of the defined types were placed in this encounter.   Disposition:  Follow up in 6 month(s)    Signed, Glenetta Hew, MD  10/07/2018 12:02 PM    [place]

## 2018-10-07 ENCOUNTER — Encounter: Payer: Self-pay | Admitting: Cardiology

## 2018-10-07 NOTE — Assessment & Plan Note (Signed)
EF improved on medical management with control of A. fib.  This is the remaining reason for her working to ensure rhythm control rate control. Is on stable dose of Lasix mostly only taking 1 tablet daily.  Rarely taking additional PRN doses.  He is on stable dose of carvedilol and losartan for now.

## 2018-10-07 NOTE — Assessment & Plan Note (Signed)
I do not have his current blood pressure recordings for today, but per his report, his pressures have been okay.  His most recent blood pressure in PCPs office was well controlled.  Seems to be tolerating current dose of losartan and carvedilol well.

## 2018-10-07 NOTE — Assessment & Plan Note (Signed)
He is done his best he can to quit smoking, but has not finally actually fully been able to quit.  He tried to quit back in January, but is back to smoking "a handful" of cigarettes a day.  He has definitely made great strides in his attempted smoking cessation, and does notice an improvement with his exertional dyspnea, but has not yet ready to make final step.  I counseled him on potential options including Chantix etc.

## 2018-10-07 NOTE — Assessment & Plan Note (Signed)
Remains stable on low-dose atorvastatin.  Both direct and calculated LDL are within goal.

## 2018-10-07 NOTE — Assessment & Plan Note (Signed)
Seems to be mostly maintaining sinus rhythm with amiodarone and Cardizem carvedilol.  No bleeding issues with Eliquis.  PFTs were a little bit concerning, normal sed rate and CRP.  We will recheck PFTs again along with ESR and CRP.  --Pending results of upcoming PFTs, may consider reducing daily dose of amiodarone to 1/2 tablet (100 mg) daily.

## 2018-10-09 ENCOUNTER — Other Ambulatory Visit: Payer: Self-pay | Admitting: Family Medicine

## 2018-10-14 ENCOUNTER — Other Ambulatory Visit: Payer: Self-pay

## 2018-10-14 ENCOUNTER — Telehealth: Payer: Self-pay | Admitting: Family Medicine

## 2018-10-14 MED ORDER — METFORMIN HCL 500 MG PO TABS: 1000.0000 mg | ORAL_TABLET | Freq: Two times a day (BID) | ORAL | 0 refills | Status: DC

## 2018-10-14 NOTE — Telephone Encounter (Signed)
Copied from [place] [id number]. Topic: Quick Communication - Rx Refill/Question >> Oct 14, 2018  3:00 PM Stovall, Shana A wrote: Medication:  Eliquis 5 mg- this one goes to the mail order pharmacy Metformin 2 tabs / 2 times daily [facility] [place]  Has the patient contacted their pharmacy? Yes.   (Agent: If no, request that the patient contact the pharmacy for the refill.) (Agent: If yes, when and what did the pharmacy advise?)  Preferred Pharmacy (with phone number or street name): [facility]  See above   Agent: Please be advised that RX refills may take up to 3 business days. We ask that you follow-up with your pharmacy.

## 2018-10-14 NOTE — Telephone Encounter (Signed)
Called patient and let him know that I have [facility]. He will contact us when the Eliquis is due for refill.

## 2018-10-21 ENCOUNTER — Emergency Department (HOSPITAL_COMMUNITY): Payer: Medicare Other

## 2018-10-21 ENCOUNTER — Inpatient Hospital Stay (HOSPITAL_COMMUNITY)
Admission: EM | Admit: 2018-10-21 | Discharge: 2018-10-27 | DRG: 064 | Disposition: A | Discharge status: Home / Self Care | Payer: Medicare Other | Attending: Neurology | Admitting: Neurology

## 2018-10-21 ENCOUNTER — Encounter (HOSPITAL_COMMUNITY): Payer: Self-pay | Admitting: Emergency Medicine

## 2018-10-21 ENCOUNTER — Other Ambulatory Visit: Payer: Self-pay | Admitting: Family Medicine

## 2018-10-21 DIAGNOSIS — D6832 Hemorrhagic disorder due to extrinsic circulating anticoagulants: Secondary | ICD-10-CM | POA: Diagnosis present

## 2018-10-21 DIAGNOSIS — I619 Nontraumatic intracerebral hemorrhage, unspecified: Secondary | ICD-10-CM | POA: Diagnosis not present

## 2018-10-21 DIAGNOSIS — Z7901 Long term (current) use of anticoagulants: Secondary | ICD-10-CM | POA: Diagnosis not present

## 2018-10-21 DIAGNOSIS — G40901 Epilepsy, unspecified, not intractable, with status epilepticus: Secondary | ICD-10-CM

## 2018-10-21 DIAGNOSIS — I611 Nontraumatic intracerebral hemorrhage in hemisphere, cortical: Secondary | ICD-10-CM | POA: Diagnosis not present

## 2018-10-21 DIAGNOSIS — G8194 Hemiplegia, unspecified affecting left nondominant side: Secondary | ICD-10-CM | POA: Diagnosis present

## 2018-10-21 DIAGNOSIS — I4819 Other persistent atrial fibrillation: Secondary | ICD-10-CM | POA: Diagnosis present

## 2018-10-21 DIAGNOSIS — D689 Coagulation defect, unspecified: Secondary | ICD-10-CM | POA: Diagnosis present

## 2018-10-21 DIAGNOSIS — I161 Hypertensive emergency: Secondary | ICD-10-CM | POA: Diagnosis not present

## 2018-10-21 DIAGNOSIS — Z79899 Other long term (current) drug therapy: Secondary | ICD-10-CM

## 2018-10-21 DIAGNOSIS — I5032 Chronic diastolic (congestive) heart failure: Secondary | ICD-10-CM | POA: Diagnosis present

## 2018-10-21 DIAGNOSIS — I639 Cerebral infarction, unspecified: Secondary | ICD-10-CM | POA: Diagnosis not present

## 2018-10-21 DIAGNOSIS — Z803 Family history of malignant neoplasm of breast: Secondary | ICD-10-CM

## 2018-10-21 DIAGNOSIS — Z8041 Family history of malignant neoplasm of ovary: Secondary | ICD-10-CM

## 2018-10-21 DIAGNOSIS — I10 Essential (primary) hypertension: Secondary | ICD-10-CM | POA: Diagnosis present

## 2018-10-21 DIAGNOSIS — I610 Nontraumatic intracerebral hemorrhage in hemisphere, subcortical: Secondary | ICD-10-CM | POA: Diagnosis not present

## 2018-10-21 DIAGNOSIS — E669 Obesity, unspecified: Secondary | ICD-10-CM | POA: Diagnosis present

## 2018-10-21 DIAGNOSIS — J439 Emphysema, unspecified: Secondary | ICD-10-CM | POA: Diagnosis present

## 2018-10-21 DIAGNOSIS — Z9842 Cataract extraction status, left eye: Secondary | ICD-10-CM

## 2018-10-21 DIAGNOSIS — Y92019 Unspecified place in single-family (private) house as the place of occurrence of the external cause: Secondary | ICD-10-CM | POA: Diagnosis not present

## 2018-10-21 DIAGNOSIS — I480 Paroxysmal atrial fibrillation: Secondary | ICD-10-CM | POA: Diagnosis present

## 2018-10-21 DIAGNOSIS — E876 Hypokalemia: Secondary | ICD-10-CM | POA: Diagnosis not present

## 2018-10-21 DIAGNOSIS — R29704 NIHSS score 4: Secondary | ICD-10-CM | POA: Diagnosis present

## 2018-10-21 DIAGNOSIS — R471 Dysarthria and anarthria: Secondary | ICD-10-CM | POA: Diagnosis present

## 2018-10-21 DIAGNOSIS — I606 Nontraumatic subarachnoid hemorrhage from other intracranial arteries: Secondary | ICD-10-CM | POA: Diagnosis present

## 2018-10-21 DIAGNOSIS — R1310 Dysphagia, unspecified: Secondary | ICD-10-CM | POA: Diagnosis present

## 2018-10-21 DIAGNOSIS — I5042 Chronic combined systolic (congestive) and diastolic (congestive) heart failure: Secondary | ICD-10-CM | POA: Diagnosis present

## 2018-10-21 DIAGNOSIS — G936 Cerebral edema: Secondary | ICD-10-CM | POA: Diagnosis present

## 2018-10-21 DIAGNOSIS — F17210 Nicotine dependence, cigarettes, uncomplicated: Secondary | ICD-10-CM | POA: Diagnosis present

## 2018-10-21 DIAGNOSIS — Z1159 Encounter for screening for other viral diseases: Secondary | ICD-10-CM

## 2018-10-21 DIAGNOSIS — N400 Enlarged prostate without lower urinary tract symptoms: Secondary | ICD-10-CM | POA: Diagnosis present

## 2018-10-21 DIAGNOSIS — I429 Cardiomyopathy, unspecified: Secondary | ICD-10-CM | POA: Diagnosis not present

## 2018-10-21 DIAGNOSIS — Z6832 Body mass index (BMI) 32.0-32.9, adult: Secondary | ICD-10-CM

## 2018-10-21 DIAGNOSIS — M109 Gout, unspecified: Secondary | ICD-10-CM | POA: Diagnosis present

## 2018-10-21 DIAGNOSIS — Z823 Family history of stroke: Secondary | ICD-10-CM

## 2018-10-21 DIAGNOSIS — E785 Hyperlipidemia, unspecified: Secondary | ICD-10-CM | POA: Diagnosis present

## 2018-10-21 DIAGNOSIS — I609 Nontraumatic subarachnoid hemorrhage, unspecified: Secondary | ICD-10-CM | POA: Diagnosis not present

## 2018-10-21 DIAGNOSIS — F17200 Nicotine dependence, unspecified, uncomplicated: Secondary | ICD-10-CM | POA: Diagnosis not present

## 2018-10-21 DIAGNOSIS — I43 Cardiomyopathy in diseases classified elsewhere: Secondary | ICD-10-CM

## 2018-10-21 DIAGNOSIS — Z720 Tobacco use: Secondary | ICD-10-CM | POA: Diagnosis present

## 2018-10-21 DIAGNOSIS — G4733 Obstructive sleep apnea (adult) (pediatric): Secondary | ICD-10-CM | POA: Diagnosis present

## 2018-10-21 DIAGNOSIS — I6523 Occlusion and stenosis of bilateral carotid arteries: Secondary | ICD-10-CM | POA: Diagnosis not present

## 2018-10-21 DIAGNOSIS — I130 Hypertensive heart and chronic kidney disease with heart failure and stage 1 through stage 4 chronic kidney disease, or unspecified chronic kidney disease: Secondary | ICD-10-CM | POA: Diagnosis present

## 2018-10-21 DIAGNOSIS — I672 Cerebral atherosclerosis: Secondary | ICD-10-CM | POA: Diagnosis present

## 2018-10-21 DIAGNOSIS — T45525A Adverse effect of antithrombotic drugs, initial encounter: Secondary | ICD-10-CM | POA: Diagnosis present

## 2018-10-21 DIAGNOSIS — R569 Unspecified convulsions: Secondary | ICD-10-CM | POA: Diagnosis present

## 2018-10-21 DIAGNOSIS — I428 Other cardiomyopathies: Secondary | ICD-10-CM | POA: Diagnosis present

## 2018-10-21 DIAGNOSIS — Z974 Presence of external hearing-aid: Secondary | ICD-10-CM

## 2018-10-21 DIAGNOSIS — E1159 Type 2 diabetes mellitus with other circulatory complications: Secondary | ICD-10-CM | POA: Diagnosis not present

## 2018-10-21 DIAGNOSIS — Z7951 Long term (current) use of inhaled steroids: Secondary | ICD-10-CM

## 2018-10-21 DIAGNOSIS — Z9841 Cataract extraction status, right eye: Secondary | ICD-10-CM

## 2018-10-21 DIAGNOSIS — Z8546 Personal history of malignant neoplasm of prostate: Secondary | ICD-10-CM

## 2018-10-21 DIAGNOSIS — Z20828 Contact with and (suspected) exposure to other viral communicable diseases: Secondary | ICD-10-CM | POA: Diagnosis not present

## 2018-10-21 DIAGNOSIS — N182 Chronic kidney disease, stage 2 (mild): Secondary | ICD-10-CM | POA: Diagnosis present

## 2018-10-21 DIAGNOSIS — R54 Age-related physical debility: Secondary | ICD-10-CM | POA: Diagnosis present

## 2018-10-21 DIAGNOSIS — E1165 Type 2 diabetes mellitus with hyperglycemia: Secondary | ICD-10-CM | POA: Diagnosis present

## 2018-10-21 DIAGNOSIS — J302 Other seasonal allergic rhinitis: Secondary | ICD-10-CM | POA: Diagnosis present

## 2018-10-21 DIAGNOSIS — Z7902 Long term (current) use of antithrombotics/antiplatelets: Secondary | ICD-10-CM

## 2018-10-21 DIAGNOSIS — Z7984 Long term (current) use of oral hypoglycemic drugs: Secondary | ICD-10-CM

## 2018-10-21 DIAGNOSIS — R404 Transient alteration of awareness: Secondary | ICD-10-CM | POA: Diagnosis not present

## 2018-10-21 DIAGNOSIS — R531 Weakness: Secondary | ICD-10-CM | POA: Diagnosis not present

## 2018-10-21 DIAGNOSIS — Z961 Presence of intraocular lens: Secondary | ICD-10-CM | POA: Diagnosis present

## 2018-10-21 DIAGNOSIS — R0902 Hypoxemia: Secondary | ICD-10-CM | POA: Diagnosis not present

## 2018-10-21 DIAGNOSIS — I6503 Occlusion and stenosis of bilateral vertebral arteries: Secondary | ICD-10-CM | POA: Diagnosis not present

## 2018-10-21 LAB — CBC
HCT: 49.7 % (ref 39.0–52.0)
Hemoglobin: 16.5 g/dL (ref 13.0–17.0)
MCH: 34.2 pg — ABNORMAL HIGH (ref 26.0–34.0)
MCHC: 33.2 g/dL (ref 30.0–36.0)
MCV: 102.9 fL — ABNORMAL HIGH (ref 80.0–100.0)
Platelets: 229 10*3/uL (ref 150–400)
RBC: 4.83 MIL/uL (ref 4.22–5.81)
RDW: 12.3 % (ref 11.5–15.5)
WBC: 10.2 10*3/uL (ref 4.0–10.5)
nRBC: 0 % (ref 0.0–0.2)

## 2018-10-21 LAB — COMPREHENSIVE METABOLIC PANEL
ALT: 28 U/L (ref 0–44)
AST: 26 U/L (ref 15–41)
Albumin: 4.2 g/dL (ref 3.5–5.0)
Alkaline Phosphatase: 85 U/L (ref 38–126)
Anion gap: 20 — ABNORMAL HIGH (ref 5–15)
BUN: 15 mg/dL (ref 8–23)
CO2: 14 mmol/L — ABNORMAL LOW (ref 22–32)
Calcium: 9 mg/dL (ref 8.9–10.3)
Chloride: 102 mmol/L (ref 98–111)
Creatinine, Ser: 1.77 mg/dL — ABNORMAL HIGH (ref 0.61–1.24)
GFR calc Af Amer: 43 mL/min — ABNORMAL LOW (ref >60)
GFR calc non Af Amer: 37 mL/min — ABNORMAL LOW (ref >60)
Glucose, Bld: 257 mg/dL — ABNORMAL HIGH (ref 70–99)
Potassium: 4.2 mmol/L (ref 3.5–5.1)
Sodium: 136 mmol/L (ref 135–145)
Total Bilirubin: 1.2 mg/dL (ref 0.3–1.2)
Total Protein: 7.1 g/dL (ref 6.5–8.1)

## 2018-10-21 LAB — PROTIME-INR
INR: 1.2 (ref 0.8–1.2)
Prothrombin Time: 14.6 seconds (ref 11.4–15.2)

## 2018-10-21 LAB — APTT: aPTT: 23 seconds — ABNORMAL LOW (ref 24–36)

## 2018-10-21 LAB — HEMOGLOBIN A1C
Hgb A1c MFr Bld: 6.9 % — ABNORMAL HIGH (ref 4.8–5.6)
Mean Plasma Glucose: 151.33 mg/dL

## 2018-10-21 LAB — DIFFERENTIAL
Abs Immature Granulocytes: 0.11 10*3/uL — ABNORMAL HIGH (ref 0.00–0.07)
Basophils Absolute: 0.1 10*3/uL (ref 0.0–0.1)
Basophils Relative: 1 %
Eosinophils Absolute: 0 10*3/uL (ref 0.0–0.5)
Eosinophils Relative: 0 %
Immature Granulocytes: 1 %
Lymphocytes Relative: 17 %
Lymphs Abs: 1.8 10*3/uL (ref 0.7–4.0)
Monocytes Absolute: 0.8 10*3/uL (ref 0.1–1.0)
Monocytes Relative: 8 %
Neutro Abs: 7.4 10*3/uL (ref 1.7–7.7)
Neutrophils Relative %: 73 %

## 2018-10-21 LAB — I-STAT CHEM 8, ED
BUN: 17 mg/dL (ref 8–23)
Calcium, Ion: 1.02 mmol/L — ABNORMAL LOW (ref 1.15–1.40)
Chloride: 104 mmol/L (ref 98–111)
Creatinine, Ser: 1.4 mg/dL — ABNORMAL HIGH (ref 0.61–1.24)
Glucose, Bld: 254 mg/dL — ABNORMAL HIGH (ref 70–99)
HCT: 49 % (ref 39.0–52.0)
Hemoglobin: 16.7 g/dL (ref 13.0–17.0)
Potassium: 4.1 mmol/L (ref 3.5–5.1)
Sodium: 136 mmol/L (ref 135–145)
TCO2: 14 mmol/L — ABNORMAL LOW (ref 22–32)

## 2018-10-21 LAB — LIPID PANEL
Cholesterol: 173 mg/dL (ref 0–200)
HDL: 35 mg/dL — ABNORMAL LOW (ref >40)
LDL Cholesterol: 77 mg/dL (ref 0–99)
Total CHOL/HDL Ratio: 4.9 RATIO
Triglycerides: 306 mg/dL — ABNORMAL HIGH (ref <150)
VLDL: 61 mg/dL — ABNORMAL HIGH (ref 0–40)

## 2018-10-21 LAB — GLUCOSE, CAPILLARY: Glucose-Capillary: 185 mg/dL — ABNORMAL HIGH (ref 70–99)

## 2018-10-21 LAB — MRSA PCR SCREENING: MRSA by PCR: NEGATIVE

## 2018-10-21 LAB — SARS CORONAVIRUS 2 BY RT PCR (HOSPITAL ORDER, PERFORMED IN ~~LOC~~ HOSPITAL LAB): SARS Coronavirus 2: NEGATIVE

## 2018-10-21 LAB — SODIUM: Sodium: 136 mmol/L (ref 135–145)

## 2018-10-21 MED ORDER — SODIUM CHLORIDE 3 % IV SOLN
INTRAVENOUS | Status: DC
  Administered 2018-10-21 – 2018-10-22 (×2): 75 mL/h via INTRAVENOUS
  Filled 2018-10-21 (×4): qty 500

## 2018-10-21 MED ORDER — LABETALOL HCL 5 MG/ML IV SOLN: 20.0000 mg | Freq: Once | INTRAVENOUS | Status: DC

## 2018-10-21 MED ORDER — CLEVIDIPINE BUTYRATE 0.5 MG/ML IV EMUL
0.0000 mg/h | INTRAVENOUS | Status: DC
  Administered 2018-10-21: 1 mg/h via INTRAVENOUS
  Filled 2018-10-21: qty 50

## 2018-10-21 MED ORDER — PROTHROMBIN COMPLEX CONC HUMAN 500 UNITS IV KIT
4930.0000 [IU] | PACK | Status: AC
  Administered 2018-10-21: 4930 [IU] via INTRAVENOUS
  Filled 2018-10-21: qty 4430

## 2018-10-21 MED ORDER — INSULIN ASPART 100 UNIT/ML ~~LOC~~ SOLN: 0.0000 [IU] | Freq: Every day | SUBCUTANEOUS | Status: DC

## 2018-10-21 MED ORDER — ACETAMINOPHEN 160 MG/5ML PO SOLN: 650.0000 mg | ORAL | Status: DC | PRN

## 2018-10-21 MED ORDER — STROKE: EARLY STAGES OF RECOVERY BOOK
Freq: Once | Status: AC
  Administered 2018-10-21: 22:00:00
  Filled 2018-10-21: qty 1

## 2018-10-21 MED ORDER — PANTOPRAZOLE SODIUM 40 MG IV SOLR
40.0000 mg | Freq: Every day | INTRAVENOUS | Status: DC
  Administered 2018-10-21: 22:00:00 40 mg via INTRAVENOUS
  Filled 2018-10-21: qty 40

## 2018-10-21 MED ORDER — MONTELUKAST SODIUM 10 MG PO TABS
10.0000 mg | ORAL_TABLET | Freq: Every day | ORAL | Status: DC
  Administered 2018-10-22 – 2018-10-26 (×5): 10 mg via ORAL
  Filled 2018-10-21 (×5): qty 1

## 2018-10-21 MED ORDER — LEVETIRACETAM IN NACL 1500 MG/100ML IV SOLN
1500.0000 mg | Freq: Two times a day (BID) | INTRAVENOUS | Status: DC
  Administered 2018-10-21: 1500 mg via INTRAVENOUS
  Filled 2018-10-21 (×2): qty 100

## 2018-10-21 MED ORDER — SODIUM CHLORIDE 0.9% FLUSH: 3.0000 mL | Freq: Once | INTRAVENOUS | Status: AC

## 2018-10-21 MED ORDER — SENNOSIDES-DOCUSATE SODIUM 8.6-50 MG PO TABS
1.0000 | ORAL_TABLET | Freq: Two times a day (BID) | ORAL | Status: DC
  Administered 2018-10-22 – 2018-10-26 (×9): 1 via ORAL
  Filled 2018-10-21 (×9): qty 1

## 2018-10-21 MED ORDER — CLEVIDIPINE BUTYRATE 0.5 MG/ML IV EMUL
0.0000 mg/h | INTRAVENOUS | Status: DC
  Administered 2018-10-21 – 2018-10-22 (×4): 8 mg/h via INTRAVENOUS
  Filled 2018-10-21: qty 100
  Filled 2018-10-21: qty 50
  Filled 2018-10-21: qty 100

## 2018-10-21 MED ORDER — ORAL CARE MOUTH RINSE
15.0000 mL | Freq: Two times a day (BID) | OROMUCOSAL | Status: DC
  Administered 2018-10-22 – 2018-10-27 (×9): 15 mL via OROMUCOSAL

## 2018-10-21 MED ORDER — LABETALOL HCL 5 MG/ML IV SOLN: 10.0000 mg | INTRAVENOUS | Status: DC | PRN

## 2018-10-21 MED ORDER — IOHEXOL 350 MG/ML SOLN
75.0000 mL | Freq: Once | INTRAVENOUS | Status: AC | PRN
  Administered 2018-10-21: 75 mL via INTRAVENOUS

## 2018-10-21 MED ORDER — LEVETIRACETAM IN NACL 500 MG/100ML IV SOLN
500.0000 mg | Freq: Two times a day (BID) | INTRAVENOUS | Status: DC
  Administered 2018-10-22: 500 mg via INTRAVENOUS
  Filled 2018-10-21: qty 100

## 2018-10-21 MED ORDER — INSULIN ASPART 100 UNIT/ML ~~LOC~~ SOLN
0.0000 [IU] | Freq: Three times a day (TID) | SUBCUTANEOUS | Status: DC
  Administered 2018-10-22: 2 [IU] via SUBCUTANEOUS
  Administered 2018-10-22: 3 [IU] via SUBCUTANEOUS
  Administered 2018-10-22: 2 [IU] via SUBCUTANEOUS
  Administered 2018-10-23: 3 [IU] via SUBCUTANEOUS
  Administered 2018-10-23: 2 [IU] via SUBCUTANEOUS
  Administered 2018-10-23 – 2018-10-24 (×2): 3 [IU] via SUBCUTANEOUS
  Administered 2018-10-24 (×2): 2 [IU] via SUBCUTANEOUS
  Administered 2018-10-25 – 2018-10-26 (×4): 3 [IU] via SUBCUTANEOUS
  Administered 2018-10-26: 2 [IU] via SUBCUTANEOUS
  Administered 2018-10-27: 3 [IU] via SUBCUTANEOUS
  Administered 2018-10-27: 2 [IU] via SUBCUTANEOUS

## 2018-10-21 MED ORDER — ACETAMINOPHEN 650 MG RE SUPP: 650.0000 mg | RECTAL | Status: DC | PRN

## 2018-10-21 MED ORDER — ACETAMINOPHEN 325 MG PO TABS
650.0000 mg | ORAL_TABLET | ORAL | Status: DC | PRN
  Administered 2018-10-23 (×2): 650 mg via ORAL
  Filled 2018-10-21 (×2): qty 2

## 2018-10-21 MED ORDER — MOMETASONE FURO-FORMOTEROL FUM 200-5 MCG/ACT IN AERO
2.0000 | INHALATION_SPRAY | Freq: Two times a day (BID) | RESPIRATORY_TRACT | Status: DC
  Administered 2018-10-21 – 2018-10-27 (×10): 2 via RESPIRATORY_TRACT
  Filled 2018-10-21 (×2): qty 8.8

## 2018-10-21 MED ORDER — CHLORHEXIDINE GLUCONATE CLOTH 2 % EX PADS
6.0000 | MEDICATED_PAD | Freq: Every day | CUTANEOUS | Status: DC
  Administered 2018-10-21 – 2018-10-27 (×4): 6 via TOPICAL

## 2018-10-21 MED ORDER — AMIODARONE HCL 200 MG PO TABS
200.0000 mg | ORAL_TABLET | Freq: Every day | ORAL | Status: DC
  Administered 2018-10-22 – 2018-10-27 (×6): 200 mg via ORAL
  Filled 2018-10-21 (×6): qty 1

## 2018-10-21 NOTE — H&P (Signed)
Neurology-ICH H&P  CC: Code Stroke - Seizure, AMS  History is obtained from:Chart, Wife (was in ER Consultation room by the time he was in CT)  HPI: Ronald [name] is a 73 y.o. male who has past medical history of persistent atrial fibrillation on Eliquis with last dose around 9 PM yesterday 10/20/2018, COPD with emphysema, hypertension, obstructive sleep apnea, hyperlipidemia, prostatic hyperplasia, colonic polyps, tobacco abuse, chronic congestive systolic and diastolic heart failure, presented to the emergency room via EMS as a code stroke for witnessed seizure activity for about 1 minute. The EMS was called for patient feeling generally sick and when they got there, his vitals were within normal limits but soon after, as they were examining him, he went into a generalized tonic-clonic seizure that lasted about 1 minute.  He was very drowsy and lethargic after that. EMS called over the emergency room charge as they were bringing him in and the charge nurse recommended that the activate an acute code stroke. The wife reports that he had been feeling unwell ever since he woke up this morning but was still talking and it was not until 3 PM that she noted that he was very confused and unable to talk well.  The last she saw him normal, presumably was when he went to bed around 11 PM. She also says that he had been complaining of a mild headache behind his eyes for the last day or 2 but did not make much of it.  He is retired.  Is able to take care of all his ADLs independently.  Walks without support.   LKW: 11 PM on 10/20/2018 tpa given?: no, ICH Premorbid modified Rankin scale (mRS):0 ICH score - 0 (GCS 13, volume 21 cc, age less than 56, no IVH, no infratentorial component)  ROS:  Unable to obtain due to altered mental status.  ROS obtained from wife listed in the HPI.  Past Medical History:  Diagnosis Date  . Allergic rhinitis   . Anticoagulant long-term use    eliquis  . Cardiomyopathy  due to systemic disease [facility])    followed by dr harding  . COPD with emphysema [facility])    pulmologist-  dr Halford Chessman  . Dyspnea    occasional per pt  . History of colon polyps    tubular adenoma 2013  . History of gout    09-05-2017 last flare-up  05/ 2019 3 wks ago, feet  . History of sepsis 02/18/2017   per d/c note probable uti, acute chf, acute renal failure, hypoxia  . Hyperlipidemia   . Hyperplasia of prostate with lower urinary tract symptoms (LUTS)   . Hypertension   . OSA on CPAP    per study 08-03-2004  Severe OSA  . Persistent atrial fibrillation    cardiologist --  dr Dorris Carnes--  post cardioversion 05-07-2014  . Prostate cancer [facility]) urologsit-  dr Alyson Ingles--- as of 05-21-2017 per pt last PSA 11 approx.   Dx  2014--  stage T1c, Gleason 3+3=6, PSA 6.67--  Active surveillance/  04/ 2019  Stage T1b, Gleason 3+4, PSA 12.8- plan external radiation therapy    . 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 Seasonal allergies   . Sigmoid diverticulosis   . Systolic and diastolic CHF, chronic [facility])    cardiologist-  [name]  . Tinea versicolor   . Type 2 diabetes mellitus ([place])   . Wears hearing aid in both ears  Family History  Problem Relation Age of Onset  . Breast cancer Mother   . Stroke Other        Unknown   . Ovarian cancer Sister   . Colon cancer Neg Hx   . Esophageal cancer Neg Hx   . Rectal cancer Neg Hx   . Stomach cancer Neg Hx     Social History:   reports that he has been smoking cigarettes. He has a 15.00 pack-year smoking history. He has never used smokeless tobacco. He reports current alcohol use of about 14.0 standard drinks of alcohol per week. He reports that he does not use drugs.  Medications  Current Facility-Administered Medications:  .  clevidipine (CLEVIPREX) infusion 0.5 mg/mL, 0-21 mg/hr, Intravenous, Continuous, Carmin Muskrat, MD .  labetalol (NORMODYNE) injection 10 mg, 10 mg,  Intravenous, Q2H PRN, Amie Portland, MD .  levETIRAcetam (KEPPRA) IVPB 1500 mg/ 100 mL premix, 1,500 mg, Intravenous, Q12H, Carmin Muskrat, MD .  prothrombin complex conc human (KCENTRA) IVPB 4,930 Units, 4,930 Units, Intravenous, STAT, Elicia Lamp P, RPH .  sodium chloride flush (NS) 0.9 % injection 3 mL, 3 mL, Intravenous, Once, Carmin Muskrat, MD  Current Outpatient Medications:  .  acetaminophen (TYLENOL) 500 MG tablet, Take 500 mg by mouth every 6 (six) hours as needed for mild pain or moderate pain., Disp: , Rfl:  .  albuterol (VENTOLIN HFA) 108 (90 Base) MCG/ACT inhaler, Inhale 2 puffs into the lungs every 6 (six) hours as needed for wheezing or shortness of breath., Disp: 1 Inhaler, Rfl: 5 .  ALPRAZolam (XANAX) 0.25 MG tablet, Take 1 tablet (0.25 mg total) at bedtime as needed by mouth for anxiety or sleep., Disp: 15 tablet, Rfl: 0 .  amiodarone (PACERONE) 200 MG tablet, TAKE 1 TABLET BY MOUTH EVERY DAY, Disp: 90 tablet, Rfl: 1 .  atorvastatin (LIPITOR) 10 MG tablet, TAKE 1 TABLET BY MOUTH DAILY EACH EVENING, Disp: 90 tablet, Rfl: 1 .  azelastine (ASTELIN) 0.1 % nasal spray, PLACE 1 SPRAY INTO BOTH NOSTRILS 2 (TWO) TIMES DAILY. USE IN EACH NOSTRIL AS DIRECTED, Disp: 30 mL, Rfl: 4 .  Blood Glucose Monitoring Suppl (ACCU-CHEK AVIVA PLUS) w/Device KIT, Use blood glucose machine as instructed on your strips, Disp: 1 kit, Rfl: 1 .  budesonide-formoterol (SYMBICORT) 160-4.5 MCG/ACT inhaler, Inhale 2 puffs into the lungs 2 (two) times daily., Disp: 1 Inhaler, Rfl: 12 .  carvedilol (COREG) 12.5 MG tablet, TAKE 1 TABLET (12.5 MG TOTAL) BY MOUTH 2 (TWO) TIMES DAILY., Disp: 180 tablet, Rfl: 1 .  colchicine 0.6 MG tablet, Two tabs at onset of gout flare and then one tab twice daily as needed (Patient taking differently: as needed. Two tabs at onset of gout flare and then one tab twice daily as needed), Disp: 30 tablet, Rfl: 2 .  ELIQUIS 5 MG TABS tablet, TAKE 1 TABLET TWICE A DAY, Disp: 180 tablet,  Rfl: 2 .  furosemide (LASIX) 40 MG tablet, TAKE 1-2 TABLETS DAILY AS NEEDED., Disp: 180 tablet, Rfl: 1 .  glucose blood (ACCU-CHEK AVIVA PLUS) test strip, Test 2 times daily dx e11.9, Disp: 100 each, Rfl: 3 .  hydrocortisone (ANUSOL-HC) 2.5 % rectal cream, Apply 2 (two) times daily topically. (Patient taking differently: Apply topically 2 (two) times daily as needed. ), Disp: 30 g, Rfl: 0 .  KLOR-CON M20 20 MEQ tablet, TAKE 2 TABLETS BY MOUTH EVERY DAY, Disp: 180 tablet, Rfl: 1 .  Lancets (ACCU-CHEK SOFT TOUCH) lancets, Check blood sugars once per day. DX:  E11.9, Disp: 100 each, Rfl: 2 .  losartan (COZAAR) 25 MG tablet, TAKE 1 TABLET BY MOUTH EVERY DAY, Disp: 90 tablet, Rfl: 3 .  metFORMIN (GLUCOPHAGE) 500 MG tablet, Take 2 tablets (1,000 mg total) by mouth 2 (two) times daily with a meal., Disp: 360 tablet, Rfl: 0 .  metoprolol tartrate (LOPRESSOR) 25 MG tablet, Take  1 tablet if  you have breakthrough atrial fib with extra dose of 200 mg Amiodarone., Disp: 10 tablet, Rfl: 6 .  montelukast (SINGULAIR) 10 MG tablet, TAKE 1 TABLET BY MOUTH EVERYDAY AT BEDTIME, Disp: 90 tablet, Rfl: 1 .  Semaglutide,0.25 or 0.5MG/DOS, (OZEMPIC, 0.25 OR 0.5 MG/DOSE,) 2 MG/1.5ML SOPN, Inject 0.5 mg into the skin once a week., Disp: 3 pen, Rfl: 3 .  triamcinolone cream (KENALOG) 0.1 %, Apply topically daily as needed., Disp: , Rfl:    Exam: Current vital signs: Ht _0  (1.803 m)   Wt 104.8 kg   BMI 32.22 kg/m  Vital signs in last 24 hours: Weight:  [104.8 kg] 104.8 kg (62/22 9798) Systolic blood pressure in the 170s. General exam: Was initially drowsy but started to come around right after CT scan. HEENT: Normocephalic, left lateral tongue bite, dried blood around the mouth, left nares trumpet. Lungs: Clear to auscultation CVS: Regular rate rhythm Abdomen: Distended nontender Neurological exam Drowsy, but was starting to come around.  Unable to remain awake.  Keeps falling asleep during the encounter. Was  able to tell me his name and date of birth.  Was unable to tell me what month it was or where he was at this time. Was able to repeat sentences.  Was able to name objects but had extremely poor attention concentration. Speech is mildly dysarthric Cranial nerves: Pupils are equal round reactive light, extraocular movements are intact, visual fields are full, facial symmetry is preserved, auditory acuity intact to conversation, palate midline, tongue midline. Motor exam: Initially unable to raise arms up but while in the CT scan was able to at least hold arms up against gravity and right leg against gravity without drift.  Left leg drifted and 5 seconds but did not touch the bed. Sensory exam: Intact light touch Coordination: Intact finger-nose-finger Gait testing deferred at this time.  NIHSS 1a Level of Conscious.: 1 1b LOC Questions: 2 1c LOC Commands: 0 2 Best Gaze: 0 3 Visual: 0 4 Facial Palsy: 0 5a Motor Arm - left: 0 5b Motor Arm - Right: 0 6a Motor Leg - Left: 1 6b Motor Leg - Right: 0 7 Limb Ataxia: 0 8 Sensory: 0 9 Best Language: 0 10 Dysarthria: 1 11 Extinct. and Inatten.: 0 TOTAL: 5  Labs I have reviewed labs in epic and the results pertinent to this consultation are:  CBC    Component Value Date/Time   WBC 10.2 10/21/2018 1709   RBC 4.83 10/21/2018 1709   HGB 16.7 10/21/2018 1721   HCT 49.0 10/21/2018 1721   PLT 229 10/21/2018 1709   MCV 102.9 (H) 10/21/2018 1709   MCH 34.2 (H) 10/21/2018 1709   MCHC 33.2 10/21/2018 1709   RDW 12.3 10/21/2018 1709   LYMPHSABS 1.8 10/21/2018 1709   MONOABS 0.8 10/21/2018 1709   EOSABS 0.0 10/21/2018 1709   BASOSABS 0.1 10/21/2018 1709   CMP     Component Value Date/Time   NA 136 10/21/2018 1721   K 4.1 10/21/2018 1721   CL 104 10/21/2018 1721   CO2 14 (L) 10/21/2018 1709   GLUCOSE 254 (H)  10/21/2018 1721   BUN 17 10/21/2018 1721   CREATININE 1.40 (H) 10/21/2018 1721   CREATININE 0.96 03/21/2015 1022   CALCIUM 9.0  10/21/2018 1709   PROT 7.1 10/21/2018 1709   ALBUMIN 4.2 10/21/2018 1709   AST 26 10/21/2018 1709   ALT 28 10/21/2018 1709   ALKPHOS 85 10/21/2018 1709   BILITOT 1.2 10/21/2018 1709   GFRNONAA 37 (L) 10/21/2018 1709   GFRNONAA 80 03/21/2015 1022   GFRAA 43 (L) 10/21/2018 1709   GFRAA >89 03/21/2015 1022   Imaging I have reviewed the images obtained:  CT-scan of the brain-5.6 cm right frontal lobe parenchymal hemorrhage with mild edema and 6 mm local leftward midline shift.  Small volume subarachnoid hemorrhage.  Volume 21 cc. Preliminary review of the CT angiogram and CT venogram of the head is unremarkable for spot sign, LVO or vascular malformation or venous sinus thrombosis.   Assessment: 73 year old man with above past medical history with pertinent history of atrial fibrillation on Eliquis, presenting with witnessed seizure activity with prolonged postictal state, cannot rule out status epilepticus but he is now coming around and following commands so low suspicion for ongoing electrographic seizures.  Could have been in status epilepticus that resolved. CT head with right frontal 21 cc bleed with mild edema and 6 mm local leftward shift.  Also accompanied is a small volume subarachnoid bleed there. CT angios, CT venogram negative for LVO or venous sinus thrombosis or AVM or spot sign. Bleeding likely secondary to hypertension and coagulopathy  Plan: Subcortical ICH, nontraumatic Cortical ICH, nontraumatic Seizure with prolonged postictal state Hypertensive emergency   Acuity: Acute Laterality: Rt Frontal Current suspected etiology:  HTN, Coagulopathy Treatment: -Admit to neurological ICU -ICH Score: 0 -ICH Volume: 21 -BP control goal SYS<140 -Repeat head CT in 6 hours -PT/OT/ST  -neuromonitoring -EEG in the morning  CNS Cerebral edema Compression of brain -Hyperosmolar therapy-3% saline peripherally at 75 cc. -Close neuro monitoring  Dysarthria Dysphagia  following ICH  -NPO until cleared by speech -ST -Advance diet as tolerated  Hemiplegia and hemiparesis following nontraumatic intracerebral hemorrhage affecting left non-dominant side  -Continue PT/OT/ST  Seizure -Load Keppra 1500 IV x1 -Continue Keppra 500 twice daily  RESP Patient was brought in the nasal trumpet but is breathing normally on 2 L oxygen. Monitor closely Low threshold for intubation  Possible aspiration pneumonia -chest x-ray -Hold antibiotics for now  CV Hypertensive Emergency -Aggressive BP control, goal SBP <140 -Labetalol and Cleviprex  Chronic combined systolic and diastolic heart failure  -TTE -Consider cards Consult  Chronic atrial -fibrillation -Rate control -Continue amiodarone -Discontinue Eliquis -Consider cardiology consult in the morning  GI/GU ESRD Acute Kidney Failure CKD Stage 1 (GFR>90) CKD Stage 2 (GFR 60-89) CKD Stage 3 (GFR 30-59) CKD Stage 4 (GFR 15-29) CKD Stage 5 (GFR < 15) -Continue dialysis -Gentle hydration -avoid nephrotoxic agents -renal consult  HEME Coagulopathy secondary to anticoagulation -Reverse Eliquis with Kcentra.  Is more than 18 hours from last dose, hence not a candidate for Andexxa. -Check repeat CBC and PT/INR in the morning  ENDO Type 2 diabetes mellitus with hyperglycemia  -SSI -goal HgbA1c < 7  Fluid/Electrolyte Disorders CMP in the morning Replete electrolytes as necessary  ID Possible Aspiration PNA -CXR -NPO -Monitor  Nutrition E66.9 Obesity  -diet consult  Prophylaxis DVT: SCD GI: PPI Bowel: Docusate senna  Dispo: IP Rehab based on clinical course  Diet: NPO until cleared by speech or bedside swallow eval  Code Status: Full Code-had a  detailed discussion with wife about his condition and she said that they have mutually agreed that each partner would make the decision about how aggressive the level of care has to be and CODE STATUS if need be.  She would want him to be  full code and intubated if necessary.   THE FOLLOWING WERE PRESENT ON ADMISSION: Seizures, possible status epilepticus, ICH, hypertensive encephalopathy, hypertensive emergency, coagulopathy due to anticoagulation, chronic combined diastolic and systolic heart failure, possible aspiration pneumonia  -- Amie Portland, MD [facility] Pager: [phone] If 7pm to 7am, please call on call as listed on AMION.   CRITICAL CARE ATTESTATION Performed by: Amie Portland, MD Total critical care time: 60 minutes Critical care time was exclusive of separately billable procedures and treating other patients and/or supervising APPs/Residents/Students Critical care was necessary to treat or prevent imminent or life-threatening deterioration due to [place], status epilepticus, seizure, hypertensive emergency, coagulopathy This patient is critically ill and at significant risk for neurological worsening and/or death and care requires constant monitoring. Critical care was time spent personally by me on the following activities: development of treatment plan with patient and/or surrogate as well as nursing, discussions with consultants, evaluation of patient's response to treatment, examination of patient, obtaining history from patient or surrogate, ordering and performing treatments and interventions, ordering and review of laboratory studies, ordering and review of radiographic studies, pulse oximetry, re-evaluation of patient's condition, participation in multidisciplinary rounds and medical decision making of high complexity in the care of this patient.

## 2018-10-21 NOTE — ED Triage Notes (Signed)
Pt to ED via [facility].  EMS reports they were called ref. Pt not feeling well.  Pt's wife reported to EMS that she noticed pt not talking right.  Pt also had a witnessed seizure.  On arrival to ED pt very diaphoretic and hard to arouse.  Pt taken to CT, when oh table pt started to dry heave.  Pt turned to side but did not vomit.

## 2018-10-21 NOTE — Code Documentation (Signed)
Per Wife he awoke a little "off" this morning.  Last known well last night before bed.  He had a significant change this afternoon about 3pm.  He had a seizure (no seizure history per wife).  He last took his eliquis last night about 8/9pm.  Upon arrival he was postictal and very drowsy.  Stat labs and head CT done.  He had dry heaving when laying flat for CT.  O2 sats 85-88% on 6L [place]  BP 174/101  CTA head and neck done.  NIHSS 4-5 improving with mental status.  Still drowsy and doesn't answer what month it is correctly.  Cleviprex gtt started titrated to 8mg  BP 124-130s/70s  Kcentra and Keppra given IV. Dr Rory Percy at bedside to assess patient.   ICH.   Neuro checks q 1 hr Plan admit to ICU Hand off with Maudie Mercury ED RN

## 2018-10-21 NOTE — ED Provider Notes (Addendum)
[place] EMERGENCY DEPARTMENT Provider Note   CSN: [id number] Arrival date & time: 10/21/18  1709  An emergency department physician performed an initial assessment on this suspected stroke patient at 20.  History   Chief Complaint Chief Complaint  Patient presents with   Code Stroke    HPI Ronald [name] is a 73 y.o. male.     HPI Presents after being found obtunded by family members. Patient is nonverbal on arrival, level 5 caveat. Reportedly, the patient has a history of multiple medical issues, and today, wife noticed the patient was acting abnormally, had seizure-like activity, was obtunded, prompting EMS call. On arrival, the patient had additional shaking, but no sustained seizure activity. Patient did have some blood in the left side of his face, nasal trumpet was placed and he was brought here for evaluation. Patient noted to be hypertensive in route.  Past Medical History:  Diagnosis Date   Allergic rhinitis    Anticoagulant long-term use    eliquis   Cardiomyopathy due to systemic disease ([place])    followed by dr harding   COPD with emphysema [facility])    pulmologist-  dr Halford Chessman   Dyspnea    occasional per pt   History of colon polyps    tubular adenoma 2013   History of gout    09-05-2017 last flare-up  05/ 2019 3 wks ago, feet   History of sepsis 02/18/2017   per d/c note probable uti, acute chf, acute renal failure, hypoxia   Hyperlipidemia    Hyperplasia of prostate with lower urinary tract symptoms (LUTS)    Hypertension    OSA on CPAP    per study 08-03-2004  Severe OSA   Persistent atrial fibrillation    cardiologist --  dr Dorris Carnes--  post cardioversion 05-07-2014   Prostate cancer [facility]) urologsit-  dr Alyson Ingles--- as of 05-21-2017 per pt last PSA 11 approx.   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)    pulmologist-  dr Halford Chessman   Seasonal allergies    Sigmoid diverticulosis    Systolic and diastolic CHF, chronic [facility])    cardiologist-  [name]   Tinea versicolor    Type 2 diabetes mellitus ([facility])    Wears hearing aid in both ears     Patient Active Problem List   Diagnosis Date Noted   ICH (intracerebral hemorrhage) ([place]) 10/21/2018   Hepatic steatosis 04/22/2018   Actinic keratoses 06/07/2017   BPH (benign prostatic hyperplasia) 05/30/2017   Cardiomyopathy due to systemic disease ([place]) 03/09/2017   Preop cardiovascular exam 03/07/2017   Hypervolemia    Chronic combined systolic and diastolic CHF (congestive heart failure) ([place])    Hypoxia 02/21/2017   ARF (acute renal failure) ([place])    Hypomagnesemia 02/20/2017   GI bleed 02/20/2017   Severe sepsis ([place]) 02/19/2017   History of colonic polyps 07/03/2016   Chronic anticoagulation 07/03/2016   Hyperglycemia, drug-induced 04/05/2015   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 ([place]) 02/21/2015   On amiodarone therapy 12/08/2014   Edema of both legs 12/08/2014   Exertional dyspnea 08/18/2014   Hypokalemia    Tobacco abuse    Paroxysmal atrial fibrillation ([place]): CHA2DS2-VASc Score 3 - On Eliquis 05/04/2014   Cigarette smoker two packs a day or less  Prostate cancer ([place]) 10/15/2013   Obesity (BMI 30-39.9) 09/60/4540   Umbilical hernia 98/02/9146   Right inguinal hernia 06/23/2012   Hydrocele 82/95/6213   Metabolic syndrome 08/65/7846   Type 2 diabetes mellitus with hyperglycemia ([place]) 03/20/2012   Elevated PSA 03/20/2012   GOUT, UNSPECIFIED 10/07/2009   TINEA VERSICOLOR 07/19/2009   PERS HX TOBACCO USE PRESENTING HAZARDS HEALTH 07/19/2009   Obstructive sleep apnea 07/02/2008   Hyperlipidemia with target LDL less than 70 07/01/2008   Essential hypertension 07/01/2008   ALLERGIC  RHINITIS 07/01/2008    Past Surgical History:  Procedure Laterality Date   CARDIOVERSION N/A 05/07/2014   Procedure: CARDIOVERSION;  Surgeon: [name], MD;  Location: [facility];  Service: Cardiovascular;  Laterality: N/A;   CARDIOVERSION N/A 09/09/2014   Procedure: CARDIOVERSION;  Surgeon: Lelon Perla, MD;  Location: [facility] ENDOSCOPY;  Service: Cardiovascular;  Laterality: N/A;  successfully   CATARACT EXTRACTION W/ INTRAOCULAR LENS  IMPLANT, BILATERAL  08 and 09/  2018   COLONOSCOPY  last one 06-07-2011   GOLD SEED IMPLANT N/A 09/09/2017   Procedure: GOLD SEED IMPLANT;  Surgeon: [name], MD;  Location[facility];  Service: Urology;  Laterality: N/A;   HYDROCELE EXCISION Bilateral 09/26/2015   Procedure: HYDROCELECTOMY ADULT;  Surgeon: [name], MD;  [facility];  Service: Urology;  Laterality: Bilateral;   LAMINECTOMY AND MICRODISCECTOMY LUMBAR SPINE  12-23-2003   Left L5 -- S1   LAPAROSCOPIC BILATERAL INGUINAL HERNIA REPAIR/  UMBILICAL HERNIA REPAIR WITH MESH/  ASPIRATION LEFT HYDROCELE  07-11-2012   NM MYOVIEW LTD  05/18/2014   Low risk study. Normal perfusion: No ischemia or infarction. Mild LV dysfunction - 46% (does not correlate with echocardiographic EF of 50-55%)   PROSTATE BIOPSY  05/15/12   Clinically both [place]   SPACE OAR INSTILLATION N/A 09/09/2017   Procedure: SPACE OAR INSTILLATION;  Surgeon: [name], MD;  Location[facility];  Service: Urology;  Laterality: N/A;   TEE WITHOUT CARDIOVERSION N/A 05/07/2014   Procedure: TRANSESOPHAGEAL ECHOCARDIOGRAM (TEE);  Surgeon: [name], MD;  Location: [facility] ENDOSCOPY;  Service: Cardiovascular;  Laterality: N/A;   mild atherosclerosis plaque of aorta,  mild AR, MR, and TR,  no cardiac source of emboli   TONSILLECTOMY  as child   TRANSTHORACIC ECHOCARDIOGRAM  11/'18; 1/'19   a) In setting of sepsis: EF of 40-45%.  Diffuse  hypokinesis.  No RWMA.  Biatrial enlargement.;; b) ** f/u Jan 2019: Normal LVF 55-60%** , mildly dilated aortic root(38 mm) and ascending aorta (44 mm). Compared to prior echo, LVEF has improved.   TRANSURETHRAL RESECTION OF PROSTATE N/A 05/30/2017   Procedure: TRANSURETHRAL RESECTION OF THE PROSTATE (TURP);  Surgeon: [name], MD;  Location: [facility];  Service: Urology;  Laterality: N/A;        Home Medications    Prior to Admission medications   Medication Sig Start Date End Date Taking? Authorizing Provider  albuterol (VENTOLIN HFA) 108 (90 Base) MCG/ACT inhaler Inhale 2 puffs into the lungs every 6 (six) hours as needed for wheezing or shortness of breath. 03/12/18  Yes Chesley Mires, MD  amiodarone (PACERONE) 200 MG tablet TAKE 1 TABLET BY MOUTH EVERY DAY Patient taking differently: Take 200 mg by mouth daily.  04/30/18  Yes Leonie Man, MD  atorvastatin (LIPITOR) 10 MG tablet TAKE 1 TABLET BY MOUTH DAILY EACH EVENING Patient taking differently: Take 10 mg by mouth at bedtime.  06/26/18  Yes Burchette, Alinda Sierras, MD  azelastine (ASTELIN) 0.1 % nasal spray PLACE 1 SPRAY INTO BOTH NOSTRILS 2 (TWO) TIMES DAILY. USE IN EACH NOSTRIL AS DIRECTED Patient taking differently: Place 1 spray into both nostrils 2 (two) times daily as needed for rhinitis or allergies.  09/22/18  Yes Burchette, Alinda Sierras, MD  budesonide-formoterol (SYMBICORT) 160-4.5 MCG/ACT inhaler Inhale 2 puffs into the lungs 2 (two) times daily. 03/26/18  Yes Chesley Mires, MD  carvedilol (COREG) 12.5 MG tablet TAKE 1 TABLET (12.5 MG TOTAL) BY MOUTH 2 (TWO) TIMES DAILY. 08/08/18 11/06/18 Yes Leonie Man, MD  colchicine 0.6 MG tablet Two tabs at onset of gout flare and then one tab twice daily as needed Patient taking differently: Take 0.6-1.2 mg by mouth See admin instructions. Take 1.2 mg by mouth at onset of gout flare, then 0.6 mg two times a day as needed for flare(s) 01/30/17  Yes Burchette, Alinda Sierras, MD  ELIQUIS 5 MG  TABS tablet TAKE 1 TABLET TWICE A DAY Patient taking differently: Take 5 mg by mouth 2 (two) times daily.  04/24/18  Yes McKenzie, Candee Furbish, MD  furosemide (LASIX) 40 MG tablet TAKE 1-2 TABLETS DAILY AS NEEDED. Patient taking differently: Take 40 mg by mouth See admin instructions. Take 40 mg by mouth in the morning and 40 mg at 2 PM 10/14/18  Yes Burchette, Alinda Sierras, MD  KLOR-CON M20 20 MEQ tablet TAKE 2 TABLETS BY MOUTH EVERY DAY Patient taking differently: Take 40 mEq by mouth daily.  10/21/18  Yes Burchette, Alinda Sierras, MD  losartan (COZAAR) 25 MG tablet TAKE 1 TABLET BY MOUTH EVERY DAY Patient taking differently: Take 25 mg by mouth daily.  02/03/18  Yes Leonie Man, MD  metFORMIN (GLUCOPHAGE) 500 MG tablet Take 2 tablets (1,000 mg total) by mouth 2 (two) times daily with a meal. 10/14/18  Yes Burchette, Alinda Sierras, MD  metoprolol tartrate (LOPRESSOR) 25 MG tablet Take  1 tablet if  you have breakthrough atrial fib with extra dose of 200 mg Amiodarone. Patient taking differently: Take 25 mg by mouth See admin instructions. Take 25 mg by mouth for breakthrough A-Fib (with an extra dose of 200 mg Amiodarone) 07/30/17  Yes Leonie Man, MD  montelukast (SINGULAIR) 10 MG tablet TAKE 1 TABLET BY MOUTH EVERYDAY AT BEDTIME Patient taking differently: Take 10 mg by mouth every evening.  07/15/18  Yes Burchette, Alinda Sierras, MD  PRESCRIPTION MEDICATION CPAP- At bedtime   Yes [provider]  Semaglutide,0.25 or 0.5MG/DOS, (OZEMPIC, 0.25 OR 0.5 MG/DOSE,) 2 MG/1.5ML SOPN Inject 0.5 mg into the skin once a week. Patient taking differently: Inject 0.5 mg into the skin every Sunday.  08/20/18  Yes Burchette, Alinda Sierras, MD  triamcinolone cream (KENALOG) 0.1 % Apply 1 application topically daily as needed (to affected sites).    Yes [provider]  Blood Glucose Monitoring Suppl (ACCU-CHEK AVIVA PLUS) w/Device KIT Use blood glucose machine as instructed on your strips 04/22/18   Burchette, Alinda Sierras, MD    glucose blood (ACCU-CHEK AVIVA PLUS) test strip Test 2 times daily dx e11.9 08/20/18   Burchette, Alinda Sierras, MD  Lancets (ACCU-CHEK SOFT TOUCH) lancets Check blood sugars once per day. DX: E11.9 10/24/15   Burchette, Alinda Sierras, MD    Family History Family History  Problem Relation Age of Onset   Breast cancer Mother    Stroke Other        Unknown    Ovarian cancer Sister  Colon cancer Neg Hx    Esophageal cancer Neg Hx    Rectal cancer Neg Hx    Stomach cancer Neg Hx     Social History Social History   Tobacco Use   Smoking status: Current Every Day Smoker    Packs/day: 0.25    Years: 60.00    Pack years: 15.00    Types: Cigarettes    Last attempt to quit: 04/11/2018    Years since quitting: 0.5   Smokeless tobacco: Never Used   Tobacco comment: 09-05-2017 tried quitting smoking 11/ 2018 but started back at 0.75ppd from 1ppd  Substance Use Topics   Alcohol use: Yes    Alcohol/week: 14.0 standard drinks    Types: 14 Standard drinks or equivalent per week    Comment: 2 drinks daily   Drug use: No     Allergies   Patient has no known allergies.   Review of Systems Review of Systems  Unable to perform ROS: Acuity of condition     Physical Exam Updated Vital Signs BP (!) 130/110 (BP Location: Right Arm)    Pulse 96    Temp 97.9 F (36.6 C) (Oral)    Resp 20    Ht 5' 11" (1.803 m)    Wt 103.9 kg    SpO2 (!) 89%    BMI 31.95 kg/m   Physical Exam Vitals signs and nursing note reviewed.  Constitutional:      General: He is in acute distress.     Appearance: He is well-developed. He is ill-appearing.     Comments: Ill-appearing adult male moving minimally, obtunded  HENT:     Head: Normocephalic.     Comments: No obvious head trauma, but there is trace blood on the left lateral mouth Eyes:     Conjunctiva/sclera: Conjunctivae normal.  Pulmonary:     Effort: Pulmonary effort is normal. No respiratory distress.     Breath sounds: No stridor.   Abdominal:     General: There is no distension.  Skin:    General: Skin is warm and dry.  Neurological:     Comments: Obtunded, but reacts to stimuli, follows some commands, inconsistently, more consistently on the left.  Psychiatric:        Cognition and Memory: Cognition is impaired.      ED Treatments / Results  Labs (all labs ordered are listed, but only abnormal results are displayed) Labs Reviewed  APTT - Abnormal; Notable for the following components:      Result Value   aPTT 23 (*)    All other components within normal limits  CBC - Abnormal; Notable for the following components:   MCV 102.9 (*)    MCH 34.2 (*)    All other components within normal limits  DIFFERENTIAL - Abnormal; Notable for the following components:   Abs Immature Granulocytes 0.11 (*)    All other components within normal limits  COMPREHENSIVE METABOLIC PANEL - Abnormal; Notable for the following components:   CO2 14 (*)    Glucose, Bld 257 (*)    Creatinine, Ser 1.77 (*)    GFR calc non Af Amer 37 (*)    GFR calc Af Amer 43 (*)    Anion gap 20 (*)    All other components within normal limits  LIPID PANEL - Abnormal; Notable for the following components:   Triglycerides 306 (*)    HDL 35 (*)    VLDL 61 (*)    All other components within  normal limits  HEMOGLOBIN A1C - Abnormal; Notable for the following components:   Hgb A1c MFr Bld 6.9 (*)    All other components within normal limits  I-STAT CHEM 8, ED - Abnormal; Notable for the following components:   Creatinine, Ser 1.40 (*)    Glucose, Bld 254 (*)    Calcium, Ion 1.02 (*)    TCO2 14 (*)    All other components within normal limits  SARS CORONAVIRUS 2 (HOSPITAL ORDER, [place] [facility])  PROTIME-INR  CBC  COMPREHENSIVE METABOLIC PANEL  SODIUM  SODIUM  CBG MONITORING, ED    EKG EKG Interpretation  Date/Time:  Tuesday October 21 2018 17:52:49 EDT Ventricular Rate:  85 PR Interval:    QRS Duration: 92 QT  Interval:  351 QTC Calculation: 418 R Axis:   -34 Text Interpretation:  Sinus rhythm Left axis deviation Abnormal R-wave progression, late transition Borderline T wave abnormalities Abnormal ECG Confirmed by Carmin Muskrat [phone]) on 10/21/2018 7:32:13 PM   Radiology Ct Angio Head W Or Wo Contrast  Result Date: 10/21/2018 CLINICAL DATA:  Focal neuro deficit, > 6 hrs, stroke suspected. Seizure. On anticoagulation with right frontal parenchymal hemorrhage. EXAM: CT ANGIOGRAPHY HEAD AND NECK CT VENOGRAM HEAD TECHNIQUE: Multidetector CT imaging of the head and neck was performed using the standard protocol during bolus administration of intravenous contrast. Multiplanar CT image reconstructions and MIPs were obtained to evaluate the vascular anatomy. Carotid stenosis measurements (when applicable) are obtained utilizing NASCET criteria, using the distal internal carotid diameter as the denominator. CONTRAST:  42m OMNIPAQUE IOHEXOL 350 MG/ML SOLN COMPARISON:  CTA neck 11/30/2015.  CT chest 03/17/2018. FINDINGS: CTA NECK FINDINGS Aortic arch: Partially visualized aneurysmal dilatation of the distal ascending aorta with diameter 4.2 cm, unchanged from the prior chest CT. Standard 3 vessel aortic arch with mild atherosclerotic plaque. No significant arch vessel origin stenosis. Right carotid system: Patent with mild calcified plaque in the proximal ICA. No evidence of dissection or stenosis. Left carotid system: Patent with mild calcified plaque in the proximal ICA. No evidence of dissection or stenosis. Tortuous proximal common carotid artery. Vertebral arteries: Patent with the right vertebral artery being mildly dominant. Calcified plaque in the right V1 segment results in at most mild stenosis. Skeleton: Moderate disc and facet degeneration in the cervical spine. Other neck: Nasal airway in place. No evidence of cervical lymphadenopathy or mass. Upper chest: Motion artifact in the lung apices. Review of the  MIP images confirms the above findings CTA HEAD FINDINGS Anterior circulation: The internal carotid arteries are patent from skull base to carotid termini with mild-to-moderate calcified plaque bilaterally which does not result in significant stenosis. ACAs and MCAs are patent without evidence of proximal branch occlusion or significant proximal stenosis. No aneurysm or vascular malformation is identified with special attention to the region of the right frontal hemorrhage. Posterior circulation: The intracranial vertebral arteries are patent to the basilar with atherosclerotic plaque resulting in mild stenosis bilaterally. Patent PICA and SCA origins are seen bilaterally. The basilar artery is widely patent. Posterior communicating arteries are small or absent. PCAs are patent with mild atherosclerotic irregularity but no flow limiting proximal stenosis. No aneurysm is identified. Venous sinuses: More fully evaluated below. Anatomic variants: None. CT VENOGRAM FINDINGS The superior sagittal sinus, internal cerebral veins, vein of Galen, straight sinus, transverse sinuses, sigmoid sinuses, and jugular bulbs are patent without evidence of thrombus. Cortical veins are grossly symmetric. Review of the MIP images confirms the above findings  IMPRESSION: 1. Mild intracranial atherosclerosis without large vessel occlusion, flow limiting proximal stenosis, or aneurysm. 2. Widely patent cervical carotid arteries. 3. Patent vertebral arteries with mild proximal stenosis on the right. 4. Negative CT venogram. These results were called by telephone at the time of interpretation on 10/21/2018 at 5:58 pm to Dr. Rory Percy, who verbally acknowledged these results. Electronically Signed   By: Logan Bores M.D.   On: 10/21/2018 19:04   Ct Angio Neck W Or Wo Contrast  Result Date: 10/21/2018 CLINICAL DATA:  Focal neuro deficit, > 6 hrs, stroke suspected. Seizure. On anticoagulation with right frontal parenchymal hemorrhage. EXAM: CT  ANGIOGRAPHY HEAD AND NECK CT VENOGRAM HEAD TECHNIQUE: Multidetector CT imaging of the head and neck was performed using the standard protocol during bolus administration of intravenous contrast. Multiplanar CT image reconstructions and MIPs were obtained to evaluate the vascular anatomy. Carotid stenosis measurements (when applicable) are obtained utilizing NASCET criteria, using the distal internal carotid diameter as the denominator. CONTRAST:  55m OMNIPAQUE IOHEXOL 350 MG/ML SOLN COMPARISON:  CTA neck 11/30/2015.  CT chest 03/17/2018. FINDINGS: CTA NECK FINDINGS Aortic arch: Partially visualized aneurysmal dilatation of the distal ascending aorta with diameter 4.2 cm, unchanged from the prior chest CT. Standard 3 vessel aortic arch with mild atherosclerotic plaque. No significant arch vessel origin stenosis. Right carotid system: Patent with mild calcified plaque in the proximal ICA. No evidence of dissection or stenosis. Left carotid system: Patent with mild calcified plaque in the proximal ICA. No evidence of dissection or stenosis. Tortuous proximal common carotid artery. Vertebral arteries: Patent with the right vertebral artery being mildly dominant. Calcified plaque in the right V1 segment results in at most mild stenosis. Skeleton: Moderate disc and facet degeneration in the cervical spine. Other neck: Nasal airway in place. No evidence of cervical lymphadenopathy or mass. Upper chest: Motion artifact in the lung apices. Review of the MIP images confirms the above findings CTA HEAD FINDINGS Anterior circulation: The internal carotid arteries are patent from skull base to carotid termini with mild-to-moderate calcified plaque bilaterally which does not result in significant stenosis. ACAs and MCAs are patent without evidence of proximal branch occlusion or significant proximal stenosis. No aneurysm or vascular malformation is identified with special attention to the region of the right frontal hemorrhage.  Posterior circulation: The intracranial vertebral arteries are patent to the basilar with atherosclerotic plaque resulting in mild stenosis bilaterally. Patent PICA and SCA origins are seen bilaterally. The basilar artery is widely patent. Posterior communicating arteries are small or absent. PCAs are patent with mild atherosclerotic irregularity but no flow limiting proximal stenosis. No aneurysm is identified. Venous sinuses: More fully evaluated below. Anatomic variants: None. CT VENOGRAM FINDINGS The superior sagittal sinus, internal cerebral veins, vein of Galen, straight sinus, transverse sinuses, sigmoid sinuses, and jugular bulbs are patent without evidence of thrombus. Cortical veins are grossly symmetric. Review of the MIP images confirms the above findings IMPRESSION: 1. Mild intracranial atherosclerosis without large vessel occlusion, flow limiting proximal stenosis, or aneurysm. 2. Widely patent cervical carotid arteries. 3. Patent vertebral arteries with mild proximal stenosis on the right. 4. Negative CT venogram. These results were called by telephone at the time of interpretation on 10/21/2018 at 5:58 pm to Dr. ARory Percy who verbally acknowledged these results. Electronically Signed   By: ALogan BoresM.D.   On: 10/21/2018 19:04   Ct Venogram Head  Result Date: 10/21/2018 CLINICAL DATA:  Focal neuro deficit, > 6 hrs, stroke suspected. Seizure. On anticoagulation with  right frontal parenchymal hemorrhage. EXAM: CT ANGIOGRAPHY HEAD AND NECK CT VENOGRAM HEAD TECHNIQUE: Multidetector CT imaging of the head and neck was performed using the standard protocol during bolus administration of intravenous contrast. Multiplanar CT image reconstructions and MIPs were obtained to evaluate the vascular anatomy. Carotid stenosis measurements (when applicable) are obtained utilizing NASCET criteria, using the distal internal carotid diameter as the denominator. CONTRAST:  10m OMNIPAQUE IOHEXOL 350 MG/ML SOLN  COMPARISON:  CTA neck 11/30/2015.  CT chest 03/17/2018. FINDINGS: CTA NECK FINDINGS Aortic arch: Partially visualized aneurysmal dilatation of the distal ascending aorta with diameter 4.2 cm, unchanged from the prior chest CT. Standard 3 vessel aortic arch with mild atherosclerotic plaque. No significant arch vessel origin stenosis. Right carotid system: Patent with mild calcified plaque in the proximal ICA. No evidence of dissection or stenosis. Left carotid system: Patent with mild calcified plaque in the proximal ICA. No evidence of dissection or stenosis. Tortuous proximal common carotid artery. Vertebral arteries: Patent with the right vertebral artery being mildly dominant. Calcified plaque in the right V1 segment results in at most mild stenosis. Skeleton: Moderate disc and facet degeneration in the cervical spine. Other neck: Nasal airway in place. No evidence of cervical lymphadenopathy or mass. Upper chest: Motion artifact in the lung apices. Review of the MIP images confirms the above findings CTA HEAD FINDINGS Anterior circulation: The internal carotid arteries are patent from skull base to carotid termini with mild-to-moderate calcified plaque bilaterally which does not result in significant stenosis. ACAs and MCAs are patent without evidence of proximal branch occlusion or significant proximal stenosis. No aneurysm or vascular malformation is identified with special attention to the region of the right frontal hemorrhage. Posterior circulation: The intracranial vertebral arteries are patent to the basilar with atherosclerotic plaque resulting in mild stenosis bilaterally. Patent PICA and SCA origins are seen bilaterally. The basilar artery is widely patent. Posterior communicating arteries are small or absent. PCAs are patent with mild atherosclerotic irregularity but no flow limiting proximal stenosis. No aneurysm is identified. Venous sinuses: More fully evaluated below. Anatomic variants: None. CT  VENOGRAM FINDINGS The superior sagittal sinus, internal cerebral veins, vein of Galen, straight sinus, transverse sinuses, sigmoid sinuses, and jugular bulbs are patent without evidence of thrombus. Cortical veins are grossly symmetric. Review of the MIP images confirms the above findings IMPRESSION: 1. Mild intracranial atherosclerosis without large vessel occlusion, flow limiting proximal stenosis, or aneurysm. 2. Widely patent cervical carotid arteries. 3. Patent vertebral arteries with mild proximal stenosis on the right. 4. Negative CT venogram. These results were called by telephone at the time of interpretation on 10/21/2018 at 5:58 pm to Dr. ARory Percy who verbally acknowledged these results. Electronically Signed   By: ALogan BoresM.D.   On: 10/21/2018 19:04   Ct Head Code Stroke Wo Contrast  Result Date: 10/21/2018 CLINICAL DATA:  Code stroke. Focal neuro deficit, < 6 hrs, stroke suspected. Seizure. On anticoagulation. EXAM: CT HEAD WITHOUT CONTRAST TECHNIQUE: Contiguous axial images were obtained from the base of the skull through the vertex without intravenous contrast. COMPARISON:  11/30/2015 FINDINGS: Brain: An acute parenchymal hemorrhage in the anteromedial right frontal lobe measures 2.3 x 3.2 x 5.6 cm (approximate volume of 21 mL). There is mild surrounding edema with mass effect on the right frontal horn and localized leftward midline shift of 6 mm. Small volume subarachnoid hemorrhage is noted in the frontal region on both sides of the falx as well as at the posterior aspect of the right sylvian  fissure. No acute infarct is identified separate from the hemorrhage. Mild cerebral atrophy is within normal limits for age. Vascular: Calcified atherosclerosis at the skull base. No hyperdense vessel. Skull: No fracture or focal osseous lesion. Sinuses/Orbits: Paranasal sinuses and mastoid air cells are clear. Bilateral cataract extraction. Other: None. ASPECTS [facility] Stroke Program Early CT Score) Not  scored due to the presence of acute hemorrhage. IMPRESSION: 1. 5.6 cm right frontal lobe parenchymal hemorrhage with mild edema and 6 mm of local leftward midline shift. 2. Small volume subarachnoid hemorrhage. Critical Value/emergent results were called by telephone at the time of interpretation on 10/21/2018 at 5:58 pm to Dr. Rory Percy, who verbally acknowledged these results. Electronically Signed   By: Logan Bores M.D.   On: 10/21/2018 18:08    Procedures Procedures (including critical care time)  Medications Ordered in ED Medications  sodium chloride flush (NS) 0.9 % injection 3 mL (has no administration in time range)  labetalol (NORMODYNE) injection 10 mg (has no administration in time range)   stroke: mapping our early stages of recovery book (has no administration in time range)  acetaminophen (TYLENOL) tablet 650 mg (has no administration in time range)    Or  acetaminophen (TYLENOL) solution 650 mg (has no administration in time range)    Or  acetaminophen (TYLENOL) suppository 650 mg (has no administration in time range)  senna-docusate (Senokot-S) tablet 1 tablet (has no administration in time range)  pantoprazole (PROTONIX) injection 40 mg (has no administration in time range)  labetalol (NORMODYNE) injection 20 mg (has no administration in time range)    And  clevidipine (CLEVIPREX) infusion 0.5 mg/mL (has no administration in time range)  amiodarone (PACERONE) tablet 200 mg (has no administration in time range)  mometasone-formoterol (DULERA) 200-5 MCG/ACT inhaler 2 puff (has no administration in time range)  montelukast (SINGULAIR) tablet 10 mg (has no administration in time range)  levETIRAcetam (KEPPRA) IVPB 500 mg/100 mL premix (has no administration in time range)  insulin aspart (novoLOG) injection 0-15 Units (has no administration in time range)  insulin aspart (novoLOG) injection 0-5 Units (has no administration in time range)  sodium chloride (hypertonic) 3 % solution  (has no administration in time range)  prothrombin complex conc human (KCENTRA) IVPB 4,930 Units (4,930 Units Intravenous New Bag/Given 10/21/18 1814)  iohexol (OMNIPAQUE) 350 MG/ML injection 75 mL (75 mLs Intravenous Contrast Given 10/21/18 1731)     Initial Impression / Assessment and Plan / ED Course  I have reviewed the triage vital signs and the nursing notes.  Pertinent labs & imaging results that were available during my care of the patient were reviewed by me and considered in my medical decision making (see chart for details).       5:27 PM I observed the patient's emergent CT scan, for consideration of stroke versus hemorrhage given hypertension, and use of Eliquis. Initial findings concerning for frontal hemorrhage.  5:31 PM Wife notes via our neurology colleague that the patient has been feeling unwell for least 1 day, with mild headache, not feeling well until today, when he awoke, feeling out of sorts, but was not until just prior to ED arrival the patient was truly obtunded. Last dose of Eliquis was earlier this morning.  7:14 PM Patient ready for admission to the ICU. This elderly male presents with initially mild symptoms, the progressed to being nearly obtunded, prompting transfer. Patient found to have frontal hemorrhage, elevated blood pressure Patient required emergent evaluation, and consideration with our pharmacology team for  reversal due to his anticoagulant status. Patient received blood pressure control with Cleviprex. Patient require admission to the ICU for further monitoring, management.  Final Clinical Impressions(s) / ED Diagnoses   Final diagnoses:  Nontraumatic cortical hemorrhage of right cerebral hemisphere [facility])   CRITICAL CARE Performed by: Carmin Muskrat Total critical care time: 35 minutes Critical care time was exclusive of separately billable procedures and treating other patients. Critical care was necessary to treat or prevent imminent  or life-threatening deterioration. Critical care was time spent personally by me on the following activities: development of treatment plan with patient and/or surrogate as well as nursing, discussions with consultants, evaluation of patient's response to treatment, examination of patient, obtaining history from patient or surrogate, ordering and performing treatments and interventions, ordering and review of laboratory studies, ordering and review of radiographic studies, pulse oximetry and re-evaluation of patient's condition.    Carmin Muskrat, MD 10/21/18 1916    Carmin Muskrat, MD 10/21/18 [phone]

## 2018-10-22 ENCOUNTER — Inpatient Hospital Stay (HOSPITAL_COMMUNITY): Payer: Medicare Other

## 2018-10-22 ENCOUNTER — Encounter (HOSPITAL_COMMUNITY): Payer: Self-pay | Admitting: *Deleted

## 2018-10-22 ENCOUNTER — Other Ambulatory Visit: Payer: Self-pay

## 2018-10-22 DIAGNOSIS — F17200 Nicotine dependence, unspecified, uncomplicated: Secondary | ICD-10-CM

## 2018-10-22 DIAGNOSIS — E785 Hyperlipidemia, unspecified: Secondary | ICD-10-CM

## 2018-10-22 DIAGNOSIS — I639 Cerebral infarction, unspecified: Secondary | ICD-10-CM

## 2018-10-22 DIAGNOSIS — I480 Paroxysmal atrial fibrillation: Secondary | ICD-10-CM

## 2018-10-22 DIAGNOSIS — E1159 Type 2 diabetes mellitus with other circulatory complications: Secondary | ICD-10-CM

## 2018-10-22 LAB — COMPREHENSIVE METABOLIC PANEL
ALT: 22 U/L (ref 0–44)
AST: 17 U/L (ref 15–41)
Albumin: 3.5 g/dL (ref 3.5–5.0)
Alkaline Phosphatase: 66 U/L (ref 38–126)
Anion gap: 8 (ref 5–15)
BUN: 15 mg/dL (ref 8–23)
CO2: 23 mmol/L (ref 22–32)
Calcium: 8.2 mg/dL — ABNORMAL LOW (ref 8.9–10.3)
Chloride: 110 mmol/L (ref 98–111)
Creatinine, Ser: 1.4 mg/dL — ABNORMAL HIGH (ref 0.61–1.24)
GFR calc Af Amer: 57 mL/min — ABNORMAL LOW (ref >60)
GFR calc non Af Amer: 49 mL/min — ABNORMAL LOW (ref >60)
Glucose, Bld: 140 mg/dL — ABNORMAL HIGH (ref 70–99)
Potassium: 3.7 mmol/L (ref 3.5–5.1)
Sodium: 141 mmol/L (ref 135–145)
Total Bilirubin: 0.8 mg/dL (ref 0.3–1.2)
Total Protein: 5.8 g/dL — ABNORMAL LOW (ref 6.5–8.1)

## 2018-10-22 LAB — GLUCOSE, CAPILLARY
Glucose-Capillary: 147 mg/dL — ABNORMAL HIGH (ref 70–99)
Glucose-Capillary: 181 mg/dL — ABNORMAL HIGH (ref 70–99)
Glucose-Capillary: 170 mg/dL — ABNORMAL HIGH (ref 70–99)
Glucose-Capillary: 124 mg/dL — ABNORMAL HIGH (ref 70–99)

## 2018-10-22 LAB — CBC
HCT: 43.4 % (ref 39.0–52.0)
Hemoglobin: 15.1 g/dL (ref 13.0–17.0)
MCH: 34.1 pg — ABNORMAL HIGH (ref 26.0–34.0)
MCHC: 34.8 g/dL (ref 30.0–36.0)
MCV: 98 fL (ref 80.0–100.0)
Platelets: 184 10*3/uL (ref 150–400)
RBC: 4.43 MIL/uL (ref 4.22–5.81)
RDW: 12.4 % (ref 11.5–15.5)
WBC: 8.8 10*3/uL (ref 4.0–10.5)
nRBC: 0 % (ref 0.0–0.2)

## 2018-10-22 LAB — ECHOCARDIOGRAM COMPLETE
Height: 71 in
Weight: 3679.04 oz

## 2018-10-22 LAB — SODIUM
Sodium: 145 mmol/L (ref 135–145)
Sodium: 145 mmol/L (ref 135–145)

## 2018-10-22 MED ORDER — GADOBUTROL 1 MMOL/ML IV SOLN
10.0000 mL | Freq: Once | INTRAVENOUS | Status: AC | PRN
  Administered 2018-10-22: 10 mL via INTRAVENOUS

## 2018-10-22 MED ORDER — LEVETIRACETAM 500 MG PO TABS
500.0000 mg | ORAL_TABLET | Freq: Two times a day (BID) | ORAL | Status: DC
  Administered 2018-10-22 – 2018-10-27 (×11): 500 mg via ORAL
  Filled 2018-10-22 (×11): qty 1

## 2018-10-22 MED ORDER — PANTOPRAZOLE SODIUM 40 MG PO TBEC
40.0000 mg | DELAYED_RELEASE_TABLET | Freq: Every day | ORAL | Status: DC
  Administered 2018-10-22 – 2018-10-27 (×6): 40 mg via ORAL
  Filled 2018-10-22 (×6): qty 1

## 2018-10-22 MED ORDER — LABETALOL HCL 5 MG/ML IV SOLN
10.0000 mg | INTRAVENOUS | Status: DC | PRN
  Administered 2018-10-24 – 2018-10-26 (×2): 10 mg via INTRAVENOUS
  Filled 2018-10-22 (×2): qty 4

## 2018-10-22 MED ORDER — LOSARTAN POTASSIUM 25 MG PO TABS
25.0000 mg | ORAL_TABLET | Freq: Every day | ORAL | Status: DC
  Administered 2018-10-22 – 2018-10-27 (×6): 25 mg via ORAL
  Filled 2018-10-22 (×6): qty 1

## 2018-10-22 MED ORDER — CARVEDILOL 12.5 MG PO TABS
12.5000 mg | ORAL_TABLET | Freq: Two times a day (BID) | ORAL | Status: DC
  Administered 2018-10-22 – 2018-10-27 (×10): 12.5 mg via ORAL
  Filled 2018-10-22 (×10): qty 1

## 2018-10-22 MED ORDER — ALBUTEROL SULFATE (2.5 MG/3ML) 0.083% IN NEBU: 3.0000 mL | INHALATION_SOLUTION | Freq: Four times a day (QID) | RESPIRATORY_TRACT | Status: DC | PRN

## 2018-10-22 NOTE — Evaluation (Signed)
Occupational Therapy Evaluation Patient Details Name: Ronald [name] MRN: [id number] DOB: 02-Jul-1945 Today's Date: 10/22/2018    History of Present Illness 73 y.o. male with PMHx including a-fib, COPD with emphysema, HTN, chronic congestive systolic and diastolic heart failure, DM who presented to the emergency room via EMS as a code stroke for witnessed seizure activity for about 1 minute. CT-scan of the brain-5.6 cm revealed right frontal lobe parenchymal hemorrhage with mild edema and 6 mm local leftward midline shift.     Clinical Impression   This 73 y/o male presents with the above. PTA pt independent with ADL and functional mobility, reports he lives with spouse. Pt performing functional mobility in room and hallway this session without AD and overall minA. He currently requires minA for LB ADL, setup/minguard for seated UB ADL. Staff arriving to begin EEG during session so recommend further assess pt's higher level cognition during following sessions - overall appears [facility] for basic tasks but pt is easily distracted and question higher level deficits. He will benefit from continued OT services to maximize his safety and independence with ADL and mobility prior to return home. Will follow.     Follow Up Recommendations  Supervision/Assistance - 24 hour(may benefit from OP neuro, will continue to assess)    Equipment Recommendations  Other (comment)(to be further assessed)           Precautions / Restrictions Precautions Precautions: Fall Precaution Comments: monitor O2 Restrictions Weight Bearing Restrictions: No      Mobility Bed Mobility Overal bed mobility: Needs Assistance Bed Mobility: Supine to Sit;Sit to Supine     Supine to sit: Min guard;HOB elevated Sit to supine: Min guard;HOB elevated   General bed mobility comments: for lines, safety; pt able to boost self to [facility] once return to supine  Transfers Overall transfer level: Needs assistance Equipment used:  None Transfers: Sit to/from Stand Sit to Stand: Min guard;+2 safety/equipment         General transfer comment: for lines/safety    Balance Overall balance assessment: Mild deficits observed, not formally tested                                         ADL either performed or assessed with clinical judgement   ADL Overall ADL's : Needs assistance/impaired Eating/Feeding: Modified independent;Sitting   Grooming: Set up;Min guard;Sitting   Upper Body Bathing: Min guard;Sitting   Lower Body Bathing: Minimal assistance;Sit to/from stand   Upper Body Dressing : Set up;Min guard;Sitting   Lower Body Dressing: Minimal assistance;Sit to/from stand Lower Body Dressing Details (indicate cue type and reason): pt able to don socks sitting up in bed, increased effort; minA standing balance Toilet Transfer: Minimal assistance;Ambulation Toilet Transfer Details (indicate cue type and reason): simulated via transfer to/from EOB; room and hallway level mobility Toileting- Clothing Manipulation and Hygiene: Min guard;Minimal assistance;Sit to/from stand       Functional mobility during ADLs: Minimal assistance;Min guard       Vision Baseline Vision/History: Cataracts Patient Visual Report: No change from baseline Additional Comments: no apparent deficits, but will continue to assess     Perception     Praxis      Pertinent Vitals/Pain Pain Assessment: No/denies pain     Hand Dominance Left   Extremity/Trunk Assessment Upper Extremity Assessment Upper Extremity Assessment: Overall WFL for tasks assessed   Lower Extremity Assessment Lower Extremity  Assessment: Defer to PT evaluation       Communication Communication Communication: HOH   Cognition Arousal/Alertness: Awake/alert Behavior During Therapy: WFL for tasks assessed/performed Overall Cognitive Status: No family/caregiver present to determine baseline cognitive functioning                                  General Comments: overall appears WFL for basic tasks, easily distracted when other people entering room; pt joking with therapists throughout session so difficult to fully ascertain cognitive deficits, will benefit from further assessment   General Comments  pt trialled on RA during activity; lowest O2 sat 86% with ambulation into hallway, cued for deep breathing with sats increasing to 91% by return to room     Exercises     Shoulder Instructions      Home Living Family/patient expects to be discharged to:: Private residence Living Arrangements: Spouse/significant other Available Help at Discharge: Family                                    Prior Functioning/Environment Level of Independence: Independent        Comments: 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, [employer]vertising account planner Problem List: Decreased strength;Decreased range of motion;Decreased activity tolerance;Impaired balance (sitting and/or standing);Decreased safety awareness;Obesity;Decreased cognition      OT Treatment/Interventions: Self-care/ADL training;Therapeutic exercise;Neuromuscular education;DME and/or AE instruction;Therapeutic activities;Patient/family education;Balance training;Cognitive remediation/compensation    OT Goals(Current goals can be found in the care plan section) Acute Rehab OT Goals Patient Stated Goal: wants something to drink OT Goal Formulation: With patient Time For Goal Achievement: 11/05/18 Potential to Achieve Goals: Good  OT Frequency: Min 2X/week   Barriers to D/C:            Co-evaluation PT/OT/SLP Co-Evaluation/Treatment: Yes Reason for Co-Treatment: For patient/therapist safety;To address functional/ADL transfers   OT goals addressed during session: ADL's and self-care      AM-PAC OT "6 Clicks" Daily Activity     Outcome Measure Help from another person eating meals?: None Help from another person taking care of personal  grooming?: None Help from another person toileting, which includes using toliet, bedpan, or urinal?: A Little Help from another person bathing (including washing, rinsing, drying)?: A Little Help from another person to put on and taking off regular upper body clothing?: None Help from another person to put on and taking off regular lower body clothing?: A Little 6 Click Score: 21   End of Session Equipment Utilized During Treatment: Gait belt Nurse Communication: Mobility status  Activity Tolerance: Patient tolerated treatment well Patient left: in bed;with call bell/phone within reach(EEG staff present)  OT Visit Diagnosis: Other symptoms and signs involving the nervous system (R29.898);Unsteadiness on feet (R26.81)                Time: 1100-1120 OT Time Calculation (min): 20 min Charges:  OT General Charges $OT Visit: 1 Visit OT Evaluation $OT Eval Moderate Complexity: [place], OT [employer] Pager [phone] Office [place] 10/22/2018, 12:36 PM

## 2018-10-22 NOTE — Evaluation (Signed)
Clinical/Bedside Swallow Evaluation Patient Details  Name: JOBIN MONTELONGO MRN: [id number] Date of Birth: [birth year]  Today's Date: 10/22/2018 Time: SLP Start Time (ACUTE ONLY): 1014 SLP Stop Time (ACUTE ONLY): 1029 SLP Time Calculation (min) (ACUTE ONLY): 15 min  Past Medical History:  Past Medical History:  Diagnosis Date  . Allergic rhinitis   . Anticoagulant long-term use    eliquis  . 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 COPD with emphysema [facility])    pulmologist-  dr Halford Chessman  . Dyspnea    occasional per pt  . History of colon polyps    tubular adenoma 2013  . History of gout    09-05-2017 last flare-up  05/ 2019 3 wks ago, feet  . History of sepsis 02/18/2017   per d/c note probable uti, acute chf, acute renal failure, hypoxia  . Hyperlipidemia   . Hyperplasia of prostate with lower urinary tract symptoms (LUTS)   . Hypertension   . OSA on CPAP    per study 08-03-2004  Severe OSA  . Persistent atrial fibrillation    cardiologist --  dr Dorris Carnes--  post cardioversion 05-07-2014  . Prostate cancer [facility]) urologsit-  dr Alyson Ingles--- as of 05-21-2017 per pt last PSA 11 approx.   Dx  2014--  stage T1c, Gleason 3+3=6, PSA 6.67--  Active surveillance/  04/ 2019  Stage T1b, Gleason 3+4, PSA 12.8- plan external radiation therapy    . 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 Seasonal allergies   . Sigmoid diverticulosis   . Systolic and diastolic CHF, chronic [facility])    cardiologist-  [name]  . Tinea versicolor   . Type 2 diabetes mellitus ([place])   . Wears hearing aid in both ears    Past Surgical History:  Past Surgical History:  Procedure Laterality Date  . CARDIOVERSION N/A 05/07/2014   Procedure: CARDIOVERSION;  Surgeon: [name], MD;  Location: [facility] ENDOSCOPY;  Service: Cardiovascular;  Laterality: N/A;  . CARDIOVERSION N/A 09/09/2014   Procedure: CARDIOVERSION;  Surgeon: Lelon Perla, MD;  Location: [facility] ENDOSCOPY;  Service: Cardiovascular;  Laterality: N/A;  successfully  . CATARACT EXTRACTION W/ INTRAOCULAR LENS  IMPLANT, BILATERAL  08 and 09/  2018  . COLONOSCOPY  last one 06-07-2011  . GOLD SEED IMPLANT N/A 09/09/2017   Procedure: GOLD SEED IMPLANT;  Surgeon: [name], MD;  Location: [facility];  Service: Urology;  Laterality: N/A;  . HYDROCELE EXCISION Bilateral 09/26/2015   Procedure: HYDROCELECTOMY ADULT;  Surgeon: [name], MD;  Location: [facility];  Service: Urology;  Laterality: Bilateral;  . LAMINECTOMY AND MICRODISCECTOMY LUMBAR SPINE  12-23-2003   Left L5 -- S1  . LAPAROSCOPIC BILATERAL INGUINAL HERNIA REPAIR/  UMBILICAL HERNIA REPAIR WITH MESH/  ASPIRATION LEFT HYDROCELE  07-11-2012  . NM MYOVIEW LTD  05/18/2014   Low risk study. Normal perfusion: No ischemia or infarction. Mild LV dysfunction - 46% (does not correlate with echocardiographic EF of 50-55%)  . PROSTATE BIOPSY  05/15/12   Clinically both Lobes  . SPACE OAR INSTILLATION N/A 09/09/2017   Procedure: SPACE OAR INSTILLATION;  Surgeon: [name], MD;  Location: [facility];  Service: Urology;  Laterality: N/A;  . TEE WITHOUT CARDIOVERSION N/A 05/07/2014   Procedure: TRANSESOPHAGEAL ECHOCARDIOGRAM (TEE);  Surgeon: [name], MD;  Location: [place];  Service: Cardiovascular;  Laterality:  N/A;   mild atherosclerosis plaque of aorta,  mild AR, MR, and TR,  no cardiac source of emboli  . TONSILLECTOMY  as child  . TRANSTHORACIC ECHOCARDIOGRAM  11/'18; 1/'19   a) In setting of sepsis: EF of 40-45%.  Diffuse hypokinesis.  No RWMA.  Biatrial enlargement.;; b) ** f/u Jan 2019: Normal LVF 55-60%** , mildly dilated aortic root(38 mm) and ascending aorta (44 mm). Compared to prior echo, LVEF has improved.  . TRANSURETHRAL RESECTION OF PROSTATE N/A 05/30/2017   Procedure: TRANSURETHRAL RESECTION OF THE PROSTATE (TURP);  Surgeon:  [name], MD;  Location: [facility];  Service: Urology;  Laterality: N/A;   HPI:  GRANITE GODMAN is a 73 y.o. male who has past medical history of persistent atrial fibrillation, COPD with emphysema, hypertension, obstructive sleep apnea, hyperlipidemia, prostatic hyperplasia, colonic polyps, tobacco abuse, chronic congestive systolic and diastolic heart failure, presented to the emergency room via EMS as a code stroke for witnessed seizure activity for about 1 minute. Per chart wife reported pt was very confused and unable to talk well. MRI stable acute hemorrhage within right parafalcine frontal lobe well as associated edema and local mass effect.   Assessment / Plan / Recommendation Clinical Impression  73 yr old admitted 7/14 with speech, confusion and seizure who did not require intubation. RN switched out  Venturi mask for nasal cannula with initial increased work of breathing which stabalized and sats at 97%. One delayed cough after one of 2 straw sips water. Three oz cup sips tolerated without s/s aspiration or respiratory decline. Timely and efficient oral manipulation with solid, therefore recommending regular texture, thin liquids, straws allowed, pills wit thin and ST will briefly follow up for swallow (speech-cognitive eval to be initiated).    SLP Visit Diagnosis: Dysphagia, unspecified (R13.10)    Aspiration Risk  Mild aspiration risk    Diet Recommendation Regular;Thin liquid   Liquid Administration via: Cup;Straw Medication Administration: Whole meds with liquid Supervision: Patient able to self feed;Intermittent supervision to cue for compensatory strategies Compensations: Slow rate;Small sips/bites Postural Changes: Seated upright at 90 degrees    Other  Recommendations Oral Care Recommendations: Oral care BID   Follow up Recommendations None(none suspected for swallow)      Frequency and Duration min 2x/week  2 weeks       Prognosis Prognosis for Safe Diet  Advancement: Good      Swallow Study   General HPI: RAINIER FEUERBORN is a 73 y.o. male who has past medical history of persistent atrial fibrillation, COPD with emphysema, hypertension, obstructive sleep apnea, hyperlipidemia, prostatic hyperplasia, colonic polyps, tobacco abuse, chronic congestive systolic and diastolic heart failure, presented to the emergency room via EMS as a code stroke for witnessed seizure activity for about 1 minute. Per chart wife reported pt was very confused and unable to talk well. MRI stable acute hemorrhage within right parafalcine frontal lobe well as associated edema and local mass effect. Type of Study: Bedside Swallow Evaluation Previous Swallow Assessment: (no) Diet Prior to this Study: NPO Temperature Spikes Noted: No Respiratory Status: Nasal cannula History of Recent Intubation: No Behavior/Cognition: Alert;Cooperative;Pleasant mood Oral Cavity Assessment: Within Functional Limits Oral Care Completed by SLP: No Oral Cavity - Dentition: Adequate natural dentition(missing several posterior) Vision: Functional for self-feeding Self-Feeding Abilities: Able to feed self Patient Positioning: Upright in bed Baseline Vocal Quality: Normal Volitional Cough: Strong Volitional Swallow: Able to elicit    Oral/Motor/Sensory Function Overall Oral Motor/Sensory Function: Within functional limits  Ice Chips Ice chips: Not tested   Thin Liquid Thin Liquid: Impaired Presentation: Cup;Straw Pharyngeal  Phase Impairments: Cough - Immediate    Nectar Thick Nectar Thick Liquid: Not tested   Honey Thick Honey Thick Liquid: Not tested   Puree Puree: Within functional limits   Solid     Solid: Within functional limits      [place] Siren 10/22/2018,11:18 AM  Orbie Pyo Colvin Caroli.Ed Risk analyst [phone] Office [phone]

## 2018-10-22 NOTE — Progress Notes (Signed)
STROKE TEAM PROGRESS NOTE   INTERVAL HISTORY Pt lying in bed, EEG is ongoing. Pt can not remember the seizure but fully orientated. No significant neuro defiicit. Will d/c 3% saline. Off cleviprex now. BP stable.   Vitals:   10/22/18 0800 10/22/18 0815 10/22/18 0830 10/22/18 0900  BP: 99/78 101/78 115/77 128/88  Pulse: 79 88 84 83  Resp: 19 (!) 26 (!) 23 (!) 24  Temp: 98.1 F (36.7 C)     TempSrc: Oral     SpO2: 95% 91% 96% 93%  Weight:      Height:        CBC:  Recent Labs  Lab 10/21/18 1709 10/21/18 1721 10/22/18 0223  WBC 10.2  --  8.8  NEUTROABS 7.4  --   --   HGB 16.5 16.7 15.1  HCT 49.7 49.0 43.4  MCV 102.9*  --  98.0  PLT 229  --  675    Basic Metabolic Panel:  Recent Labs  Lab 10/21/18 1709 10/21/18 1721  10/22/18 0223 10/22/18 0704  NA 136 136   < > 141 145  K 4.2 4.1  --  3.7  --   CL 102 104  --  110  --   CO2 14*  --   --  23  --   GLUCOSE 257* 254*  --  140*  --   BUN 15 17  --  15  --   CREATININE 1.77* 1.40*  --  1.40*  --   CALCIUM 9.0  --   --  8.2*  --    < > = values in this interval not displayed.   Lipid Panel:     Component Value Date/Time   CHOL 173 10/21/2018 1828   TRIG 306 (H) 10/21/2018 1828   HDL 35 (L) 10/21/2018 1828   CHOLHDL 4.9 10/21/2018 1828   VLDL 61 (H) 10/21/2018 1828   LDLCALC 77 10/21/2018 1828   HgbA1c:  Lab Results  Component Value Date   HGBA1C 6.9 (H) 10/21/2018   Urine Drug Screen: No results found for: LABOPIA, COCAINSCRNUR, LABBENZ, AMPHETMU, THCU, LABBARB  Alcohol Level No results found for: ETH  IMAGING Ct Angio Head W Or Wo Contrast  Result Date: 10/21/2018 CLINICAL DATA:  Focal neuro deficit, > 6 hrs, stroke suspected. Seizure. On anticoagulation with right frontal parenchymal hemorrhage. EXAM: CT ANGIOGRAPHY HEAD AND NECK CT VENOGRAM HEAD TECHNIQUE: Multidetector CT imaging of the head and neck was performed using the standard protocol during bolus administration of intravenous contrast.  Multiplanar CT image reconstructions and MIPs were obtained to evaluate the vascular anatomy. Carotid stenosis measurements (when applicable) are obtained utilizing NASCET criteria, using the distal internal carotid diameter as the denominator. CONTRAST:  69mL OMNIPAQUE IOHEXOL 350 MG/ML SOLN COMPARISON:  CTA neck 11/30/2015.  CT chest 03/17/2018. FINDINGS: CTA NECK FINDINGS Aortic arch: Partially visualized aneurysmal dilatation of the distal ascending aorta with diameter 4.2 cm, unchanged from the prior chest CT. Standard 3 vessel aortic arch with mild atherosclerotic plaque. No significant arch vessel origin stenosis. Right carotid system: Patent with mild calcified plaque in the proximal ICA. No evidence of dissection or stenosis. Left carotid system: Patent with mild calcified plaque in the proximal ICA. No evidence of dissection or stenosis. Tortuous proximal common carotid artery. Vertebral arteries: Patent with the right vertebral artery being mildly dominant. Calcified plaque in the right V1 segment results in at most mild stenosis. Skeleton: Moderate disc and facet degeneration in the cervical spine. Other neck: Nasal  airway in place. No evidence of cervical lymphadenopathy or mass. Upper chest: Motion artifact in the lung apices. Review of the MIP images confirms the above findings CTA HEAD FINDINGS Anterior circulation: The internal carotid arteries are patent from skull base to carotid termini with mild-to-moderate calcified plaque bilaterally which does not result in significant stenosis. ACAs and MCAs are patent without evidence of proximal branch occlusion or significant proximal stenosis. No aneurysm or vascular malformation is identified with special attention to the region of the right frontal hemorrhage. Posterior circulation: The intracranial vertebral arteries are patent to the basilar with atherosclerotic plaque resulting in mild stenosis bilaterally. Patent PICA and SCA origins are seen  bilaterally. The basilar artery is widely patent. Posterior communicating arteries are small or absent. PCAs are patent with mild atherosclerotic irregularity but no flow limiting proximal stenosis. No aneurysm is identified. Venous sinuses: More fully evaluated below. Anatomic variants: None. CT VENOGRAM FINDINGS The superior sagittal sinus, internal cerebral veins, vein of Galen, straight sinus, transverse sinuses, sigmoid sinuses, and jugular bulbs are patent without evidence of thrombus. Cortical veins are grossly symmetric. Review of the MIP images confirms the above findings IMPRESSION: 1. Mild intracranial atherosclerosis without large vessel occlusion, flow limiting proximal stenosis, or aneurysm. 2. Widely patent cervical carotid arteries. 3. Patent vertebral arteries with mild proximal stenosis on the right. 4. Negative CT venogram. These results were called by telephone at the time of interpretation on 10/21/2018 at 5:58 pm to Dr. Rory Percy, who verbally acknowledged these results. Electronically Signed   By: Logan Bores M.D.   On: 10/21/2018 19:04   Ct Angio Neck W Or Wo Contrast  Result Date: 10/21/2018 CLINICAL DATA:  Focal neuro deficit, > 6 hrs, stroke suspected. Seizure. On anticoagulation with right frontal parenchymal hemorrhage. EXAM: CT ANGIOGRAPHY HEAD AND NECK CT VENOGRAM HEAD TECHNIQUE: Multidetector CT imaging of the head and neck was performed using the standard protocol during bolus administration of intravenous contrast. Multiplanar CT image reconstructions and MIPs were obtained to evaluate the vascular anatomy. Carotid stenosis measurements (when applicable) are obtained utilizing NASCET criteria, using the distal internal carotid diameter as the denominator. CONTRAST:  69mL OMNIPAQUE IOHEXOL 350 MG/ML SOLN COMPARISON:  CTA neck 11/30/2015.  CT chest 03/17/2018. FINDINGS: CTA NECK FINDINGS Aortic arch: Partially visualized aneurysmal dilatation of the distal ascending aorta with  diameter 4.2 cm, unchanged from the prior chest CT. Standard 3 vessel aortic arch with mild atherosclerotic plaque. No significant arch vessel origin stenosis. Right carotid system: Patent with mild calcified plaque in the proximal ICA. No evidence of dissection or stenosis. Left carotid system: Patent with mild calcified plaque in the proximal ICA. No evidence of dissection or stenosis. Tortuous proximal common carotid artery. Vertebral arteries: Patent with the right vertebral artery being mildly dominant. Calcified plaque in the right V1 segment results in at most mild stenosis. Skeleton: Moderate disc and facet degeneration in the cervical spine. Other neck: Nasal airway in place. No evidence of cervical lymphadenopathy or mass. Upper chest: Motion artifact in the lung apices. Review of the MIP images confirms the above findings CTA HEAD FINDINGS Anterior circulation: The internal carotid arteries are patent from skull base to carotid termini with mild-to-moderate calcified plaque bilaterally which does not result in significant stenosis. ACAs and MCAs are patent without evidence of proximal branch occlusion or significant proximal stenosis. No aneurysm or vascular malformation is identified with special attention to the region of the right frontal hemorrhage. Posterior circulation: The intracranial vertebral arteries are patent to  the basilar with atherosclerotic plaque resulting in mild stenosis bilaterally. Patent PICA and SCA origins are seen bilaterally. The basilar artery is widely patent. Posterior communicating arteries are small or absent. PCAs are patent with mild atherosclerotic irregularity but no flow limiting proximal stenosis. No aneurysm is identified. Venous sinuses: More fully evaluated below. Anatomic variants: None. CT VENOGRAM FINDINGS The superior sagittal sinus, internal cerebral veins, vein of Galen, straight sinus, transverse sinuses, sigmoid sinuses, and jugular bulbs are patent without  evidence of thrombus. Cortical veins are grossly symmetric. Review of the MIP images confirms the above findings IMPRESSION: 1. Mild intracranial atherosclerosis without large vessel occlusion, flow limiting proximal stenosis, or aneurysm. 2. Widely patent cervical carotid arteries. 3. Patent vertebral arteries with mild proximal stenosis on the right. 4. Negative CT venogram. These results were called by telephone at the time of interpretation on 10/21/2018 at 5:58 pm to Dr. Rory Percy, who verbally acknowledged these results. Electronically Signed   By: Logan Bores M.D.   On: 10/21/2018 19:04   Mr Jeri Cos ZO Contrast  Result Date: 10/22/2018 CLINICAL DATA:  73 y/o  M; intracranial hemorrhage EXAM: MRI HEAD WITHOUT AND WITH CONTRAST TECHNIQUE: Multiplanar, multiecho pulse sequences of the brain and surrounding structures were obtained without and with intravenous contrast. CONTRAST:  10 cc Gadavist COMPARISON:  10/21/2018 CT head and CTA head. FINDINGS: Brain: Stable acute hemorrhage in the right parafalcine frontal lobe measuring 5.5 cm with intermediate T1 and predominant hyperintense T2 signal. Small surrounding area of vasogenic edema and local mass effect partial effacement of the frontal horn of right lateral ventricle and minimal left-to-right anterior midline shift. Mild chronic microvascular ischemic changes and volume loss of the brain. No hydrocephalus or herniation. No extra-axial collection identified. After administration of intravenous contrast there is no abnormal enhancement. Vascular: Normal flow voids. Skull and upper cervical spine: Normal marrow signal. Sinuses/Orbits: Negative.  Bilateral intra-ocular lens replacement. Other: None. IMPRESSION: 1. Stable acute hemorrhage within right parafalcine frontal lobe as well as associated edema and local mass effect. No abnormal enhancement to suggest underlying mass or vascular malformation. 2. Mild chronic microvascular ischemic changes and volume loss  of the brain. Electronically Signed   By: Kristine Garbe M.D.   On: 10/22/2018 01:26   Ct Venogram Head  Result Date: 10/21/2018 CLINICAL DATA:  Focal neuro deficit, > 6 hrs, stroke suspected. Seizure. On anticoagulation with right frontal parenchymal hemorrhage. EXAM: CT ANGIOGRAPHY HEAD AND NECK CT VENOGRAM HEAD TECHNIQUE: Multidetector CT imaging of the head and neck was performed using the standard protocol during bolus administration of intravenous contrast. Multiplanar CT image reconstructions and MIPs were obtained to evaluate the vascular anatomy. Carotid stenosis measurements (when applicable) are obtained utilizing NASCET criteria, using the distal internal carotid diameter as the denominator. CONTRAST:  71mL OMNIPAQUE IOHEXOL 350 MG/ML SOLN COMPARISON:  CTA neck 11/30/2015.  CT chest 03/17/2018. FINDINGS: CTA NECK FINDINGS Aortic arch: Partially visualized aneurysmal dilatation of the distal ascending aorta with diameter 4.2 cm, unchanged from the prior chest CT. Standard 3 vessel aortic arch with mild atherosclerotic plaque. No significant arch vessel origin stenosis. Right carotid system: Patent with mild calcified plaque in the proximal ICA. No evidence of dissection or stenosis. Left carotid system: Patent with mild calcified plaque in the proximal ICA. No evidence of dissection or stenosis. Tortuous proximal common carotid artery. Vertebral arteries: Patent with the right vertebral artery being mildly dominant. Calcified plaque in the right V1 segment results in at most mild stenosis. Skeleton: Moderate  disc and facet degeneration in the cervical spine. Other neck: Nasal airway in place. No evidence of cervical lymphadenopathy or mass. Upper chest: Motion artifact in the lung apices. Review of the MIP images confirms the above findings CTA HEAD FINDINGS Anterior circulation: The internal carotid arteries are patent from skull base to carotid termini with mild-to-moderate calcified  plaque bilaterally which does not result in significant stenosis. ACAs and MCAs are patent without evidence of proximal branch occlusion or significant proximal stenosis. No aneurysm or vascular malformation is identified with special attention to the region of the right frontal hemorrhage. Posterior circulation: The intracranial vertebral arteries are patent to the basilar with atherosclerotic plaque resulting in mild stenosis bilaterally. Patent PICA and SCA origins are seen bilaterally. The basilar artery is widely patent. Posterior communicating arteries are small or absent. PCAs are patent with mild atherosclerotic irregularity but no flow limiting proximal stenosis. No aneurysm is identified. Venous sinuses: More fully evaluated below. Anatomic variants: None. CT VENOGRAM FINDINGS The superior sagittal sinus, internal cerebral veins, vein of Galen, straight sinus, transverse sinuses, sigmoid sinuses, and jugular bulbs are patent without evidence of thrombus. Cortical veins are grossly symmetric. Review of the MIP images confirms the above findings IMPRESSION: 1. Mild intracranial atherosclerosis without large vessel occlusion, flow limiting proximal stenosis, or aneurysm. 2. Widely patent cervical carotid arteries. 3. Patent vertebral arteries with mild proximal stenosis on the right. 4. Negative CT venogram. These results were called by telephone at the time of interpretation on 10/21/2018 at 5:58 pm to Dr. Rory Percy, who verbally acknowledged these results. Electronically Signed   By: Logan Bores M.D.   On: 10/21/2018 19:04   Ct Head Code Stroke Wo Contrast  Result Date: 10/21/2018 CLINICAL DATA:  Code stroke. Focal neuro deficit, < 6 hrs, stroke suspected. Seizure. On anticoagulation. EXAM: CT HEAD WITHOUT CONTRAST TECHNIQUE: Contiguous axial images were obtained from the base of the skull through the vertex without intravenous contrast. COMPARISON:  11/30/2015 FINDINGS: Brain: An acute parenchymal  hemorrhage in the anteromedial right frontal lobe measures 2.3 x 3.2 x 5.6 cm (approximate volume of 21 mL). There is mild surrounding edema with mass effect on the right frontal horn and localized leftward midline shift of 6 mm. Small volume subarachnoid hemorrhage is noted in the frontal region on both sides of the falx as well as at the posterior aspect of the right sylvian fissure. No acute infarct is identified separate from the hemorrhage. Mild cerebral atrophy is within normal limits for age. Vascular: Calcified atherosclerosis at the skull base. No hyperdense vessel. Skull: No fracture or focal osseous lesion. Sinuses/Orbits: Paranasal sinuses and mastoid air cells are clear. Bilateral cataract extraction. Other: None. ASPECTS [facility] Stroke Program Early CT Score) Not scored due to the presence of acute hemorrhage. IMPRESSION: 1. 5.6 cm right frontal lobe parenchymal hemorrhage with mild edema and 6 mm of local leftward midline shift. 2. Small volume subarachnoid hemorrhage. Critical Value/emergent results were called by telephone at the time of interpretation on 10/21/2018 at 5:58 pm to Dr. Rory Percy, who verbally acknowledged these results. Electronically Signed   By: Logan Bores M.D.   On: 10/21/2018 18:08    PHYSICAL EXAM  Temp:  [97.7 F (36.5 C)-98.1 F (36.7 C)] 98 F (36.7 C) (07/15 1600) Pulse Rate:  [67-96] 73 (07/15 1700) Resp:  [16-27] 21 (07/15 1700) BP: (99-157)/(58-110) 142/86 (07/15 1700) SpO2:  [85 %-98 %] 93 % (07/15 1700) FiO2 (%):  [50 %-55 %] 50 % (07/15 0806) Weight:  [  103.9 kg-104.3 kg] 104.3 kg (07/14 2000)  General - Well nourished, well developed, in no apparent distress.  Ophthalmologic - fundi not visualized due to noncooperation.  Cardiovascular - Regular rate and rhythm.  Mental Status -  Level of arousal and orientation to time, place, and person were intact. Language including expression, naming, repetition, comprehension was assessed and found intact.  Mild psychomotor slowing Fund of Knowledge was assessed and was intact.  Cranial Nerves II - XII - II - Visual field intact OU. III, IV, VI - Extraocular movements intact. V - Facial sensation intact bilaterally. VII - Facial movement intact bilaterally. VIII - Hearing & vestibular intact bilaterally. X - Palate elevates symmetrically. XI - Chin turning & shoulder shrug intact bilaterally. XII - Tongue protrusion intact.  Motor Strength - The patient's strength was normal in all extremities and pronator drift was absent.  Bulk was normal and fasciculations were absent.   Motor Tone - Muscle tone was assessed at the neck and appendages and was normal.  Reflexes - The patient's reflexes were symmetrical in all extremities and he had no pathological reflexes.  Sensory - Light touch, temperature/pinprick were assessed and were symmetrical.    Coordination - The patient had normal movements in the hands and feet with no ataxia or dysmetria.  Tremor was absent.  Gait and Station - deferred.   ASSESSMENT/PLAN Ronald [name] is a 73 y.o. male with history of persistent atrial fibrillation on Eliquis, COPD with emphysema, hypertension, obstructive sleep apnea, hyperlipidemia, prostatic hyperplasia, colonic polyps, tobacco abuse, chronic congestive systolic and diastolic heart failure presenting with new onset general tonic-clonic seizure x1 minute.  Reported headache for 1-2 days  ICH: R frontal ICH with small SAH s/p Kcentra reversal in setting of Eliquis associated coagulopathy   Code Stroke CT head 5.6 cm R frontal lobe IPH with mild edema and 6 mm left shift.  Small volume SAH.  CTA head & neck no LVO.  Mild intracranial atherosclerosis.  Mild proximal stenosis R VA.  CTV negative  MRI  R parafalcine frontal ICH with edema and local mass-effect. No CAA  2D Echo EF > 65%  EEG normal  LDL 77  HgbA1c 6.9  SCDs for VTE prophylaxis  Eliquis (apixaban) daily prior to  admission, now on No antithrombotic given ICH.   Therapy recommendations:  pending   Disposition:  pending   Seizure   No history of seizures  Loaded with Keppra  EEG normal  Continue keppra for now  Atrial Fibrillation  Home anticoagulation:  Eliquis (apixaban) daily   Eliquis on hold given ICH  On amiodarone  Cerebral edema, mild  Mild cerebral edema and MLS on CT and MRI  Does not feel he needs 3% saline  Off 3% now  NA 136-> 145  Sodium level daily  Hypertensive emergency  Home meds: Coreg 12.5 twice daily, losartan 25  Blood pressure as high as 174/101   SBP goal less than 160   off Cleviprex drip now  Resumed home BP meds  Stable . Long-term BP goal normotensive  Hyperlipidemia  Home meds: Lipitor 10  LDL 77  Statin on hold given hemorrhage   Consider SATURN trial  Diabetes type II Controlled  Home meds: Metformin 500  HgbA1c 6.9, at goal < 7.0  CBGs  SSI  PCP follow up  Resume metformin tomorrow  Tobacco abuse  Current smoker  Smoking cessation counseling provided  Pt is willing to quit  Other Stroke Risk Factors  Advanced age  ETOH use, advised to drink no more than 2 drink(s) a day  Obesity, Body mass index is 32.07 kg/m., recommend weight loss, diet and exercise as appropriate   Family hx stroke   Obstructive sleep apnea, on CPAP at home  Cardiomyopathy due to systemic disease  Chronic systolic and diastolic congestive heart failure on prn lasix PTA  Other Active Problems  COPD with emphysema  Prostate cancer  CKD stage II, Cre 1.40->1.40  Hospital day # 1  This patient is critically ill due to Bryant, seizure, hypertensive emergency and at significant risk of neurological worsening, death form status epilepticus, hematoma expansion, cerebral edema, brain herniation, recurrent stroke. This patient's care requires constant monitoring of vital signs, hemodynamics, respiratory and cardiac monitoring,  review of multiple databases, neurological assessment, discussion with family, other specialists and medical decision making of high complexity. I spent 35 minutes of neurocritical care time in the care of this patient.  Rosalin Hawking, MD PhD Stroke Neurology 10/22/2018 5:57 PM   To contact Stroke Continuity provider, please refer to [email or web address] After hours, contact General Neurology

## 2018-10-22 NOTE — Progress Notes (Signed)
OT Cancellation Note  Patient Details Name: Ronald [name] MRN: [id number] DOB: 01/03/1946   Cancelled Treatment:    Reason Eval/Treat Not Completed: Active bedrest order; will follow.   Lou Cal, OT Supplemental Rehabilitation Services Pager [phone] Office [phone]   Raymondo Band 10/22/2018, 9:37 AM

## 2018-10-22 NOTE — Progress Notes (Signed)
PT Cancellation Note  Patient Details Name: Ronald [name] MRN: [id number] DOB: Jun 21, 1945   Cancelled Treatment:    Reason Eval/Treat Not Completed: Active bedrest order   Ellamae Sia, PT, DPT Acute Rehabilitation Services Pager [phone] Office [phone]    Willy Eddy 10/22/2018, 7:40 AM

## 2018-10-22 NOTE — Progress Notes (Signed)
Echocardiogram 2D Echocardiogram has been performed.  Oneal Deputy Shlomo Seres 10/22/2018, 10:09 AM

## 2018-10-22 NOTE — Procedures (Signed)
ELECTROENCEPHALOGRAM REPORT   Patient: Ronald [name]       Room #: 9B71I EEG No. ID: [id number] Age: 73 y.o.        Sex: male Referring Physician: Erlinda Hong Report Date:  10/22/2018        Interpreting Physician: Alexis Goodell  History: Ronald [name] is an 73 y.o. male with seizure  Medications:  Pacerone, Insulin, Keppra, Dulera  Conditions of Recording:  This is a 21 channel routine scalp EEG performed with bipolar and monopolar montages arranged in accordance to the international 10/20 system of electrode placement. One channel was dedicated to EKG recording.  The patient is in the awake and drowsy states.  Description:  The waking background activity consists of a low voltage, symmetrical, fairly well organized, 8-9 Hz alpha activity, seen from the parieto-occipital and posterior temporal regions.  Low voltage fast activity, poorly organized, is seen anteriorly and is at times superimposed on more posterior regions.  A mixture of theta and alpha rhythms are seen from the central and temporal regions. The patient drowses with slowing to irregular, low voltage theta and beta activity.   Stage II sleep is not obtained. No epileptiform activity is noted.   Hyperventilation and intermittent photic stimulation were not performed.  IMPRESSION: Normal electroencephalogram, awake and drowsy. There are no focal lateralizing or epileptiform features.   Alexis Goodell, MD Neurology [phone] 10/22/2018, 1:15 PM

## 2018-10-22 NOTE — Progress Notes (Signed)
EEG completed, results pending. 

## 2018-10-22 NOTE — Evaluation (Signed)
Physical Therapy Evaluation Patient Details Name: Ronald [name] MRN: [id number] DOB: 05/11/1945 Today's Date: 10/22/2018   History of Present Illness  73 y.o. male with PMHx including a-fib, COPD with emphysema, HTN, chronic congestive systolic and diastolic heart failure, DM who presented to the emergency room via EMS as a code stroke for witnessed seizure activity for about 1 minute. CT-scan of the brain-5.6 cm revealed right frontal lobe parenchymal hemorrhage with mild edema and 6 mm local leftward midline shift.    Clinical Impression  Pt admitted with above diagnosis. Pt currently with functional limitations due to the deficits listed below (see PT Problem List). 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 On PT evaluation, pt ambulating 100 feet with no assistive device. Demonstrates mild dynamic balance deficits, decreased attention. Will continue to assess. Pt will benefit from skilled PT to increase their independence and safety with mobility to allow discharge to the venue listed below.       Follow Up Recommendations Outpatient PT    Equipment Recommendations  None recommended by PT    Recommendations for Other Services       Precautions / Restrictions Precautions Precautions: Fall Precaution Comments: monitor O2 Restrictions Weight Bearing Restrictions: No      Mobility  Bed Mobility Overal bed mobility: Needs Assistance Bed Mobility: Supine to Sit;Sit to Supine     Supine to sit: Min guard;HOB elevated Sit to supine: Min guard;HOB elevated   General bed mobility comments: for lines, safety; pt able to boost self to [facility] once return to supine  Transfers Overall transfer level: Needs assistance Equipment used: None Transfers: Sit to/from Stand Sit to Stand: Min guard         General transfer comment: for lines/safety  Ambulation/Gait Ambulation/Gait assistance: Min guard Gait Distance (Feet): 100 Feet Assistive device:  None Gait Pattern/deviations: Step-through pattern     General Gait Details: No gross unsteadiness or overt LOB. Noted left foot external rotation, supination in mid stance (pt unable to tell me if this is baseline)  Stairs            Wheelchair Mobility    Modified Rankin (Stroke Patients Only) Modified Rankin (Stroke Patients Only) Pre-Morbid Rankin Score: No symptoms Modified Rankin: Moderately severe disability     Balance Overall balance assessment: Mild deficits observed, not formally tested                                           Pertinent Vitals/Pain Pain Assessment: No/denies pain    Home Living Family/patient expects to be discharged to:: Private residence Living Arrangements: Spouse/significant other Available Help at Discharge: Family                  Prior Function Level of Independence: Independent         Comments: 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, Dispensing optician   Dominant Hand: Left    Extremity/Trunk Assessment   Upper Extremity Assessment Upper Extremity Assessment: Overall WFL for tasks assessed    Lower Extremity Assessment Lower Extremity Assessment: LLE deficits/detail LLE Deficits / Details: Strength 5/5       Communication   Communication: HOH  Cognition Arousal/Alertness: Awake/alert Behavior During Therapy: WFL for tasks assessed/performed Overall Cognitive Status: No family/caregiver present to determine baseline cognitive functioning  General Comments: overall appears WFL for basic tasks, easily distracted when other people entering room; pt joking with therapists throughout session so difficult to fully ascertain cognitive deficits, will benefit from further assessment      General Comments General comments (skin integrity, edema, etc.): pt trialled on RA during activity; lowest O2 sat 86% with ambulation into hallway, cued  for deep breathing with sats increasing to 91% by return to room     Exercises     Assessment/Plan    PT Assessment Patient needs continued PT services  PT Problem List Decreased balance;Decreased mobility       PT Treatment Interventions Gait training;Stair training;Functional mobility training;Therapeutic activities;Therapeutic exercise;Balance training;Patient/family education    PT Goals (Current goals can be found in the Care Plan section)  Acute Rehab PT Goals Patient Stated Goal: wants something to drink PT Goal Formulation: With patient Time For Goal Achievement: 11/05/18 Potential to Achieve Goals: Good    Frequency Min 4X/week   Barriers to discharge        Co-evaluation PT/OT/SLP Co-Evaluation/Treatment: Yes Reason for Co-Treatment: For patient/therapist safety;To address functional/ADL transfers PT goals addressed during session: Mobility/safety with mobility OT goals addressed during session: ADL's and self-care       AM-PAC PT "6 Clicks" Mobility  Outcome Measure Help needed turning from your back to your side while in a flat bed without using bedrails?: None Help needed moving from lying on your back to sitting on the side of a flat bed without using bedrails?: None Help needed moving to and from a bed to a chair (including a wheelchair)?: None Help needed standing up from a chair using your arms (e.g., wheelchair or bedside chair)?: None Help needed to walk in hospital room?: A Little Help needed climbing 3-5 steps with a railing? : A Little 6 Click Score: 22    End of Session Equipment Utilized During Treatment: Gait belt Activity Tolerance: Patient tolerated treatment well Patient left: in bed;with call bell/phone within reach Nurse Communication: Mobility status PT Visit Diagnosis: Unsteadiness on feet (R26.81)    Time: 1100-1118 PT Time Calculation (min) (ACUTE ONLY): 18 min   Charges:   PT Evaluation $PT Eval Moderate Complexity: 1 Mod           Ronald [name], PT, DPT Acute Rehabilitation Services Pager [phone] Office [phone]   Ronald [name] 10/22/2018, 1:41 PM

## 2018-10-23 ENCOUNTER — Inpatient Hospital Stay (HOSPITAL_COMMUNITY): Payer: Medicare Other

## 2018-10-23 DIAGNOSIS — E669 Obesity, unspecified: Secondary | ICD-10-CM

## 2018-10-23 DIAGNOSIS — I10 Essential (primary) hypertension: Secondary | ICD-10-CM

## 2018-10-23 DIAGNOSIS — Z7901 Long term (current) use of anticoagulants: Secondary | ICD-10-CM

## 2018-10-23 DIAGNOSIS — E1165 Type 2 diabetes mellitus with hyperglycemia: Secondary | ICD-10-CM

## 2018-10-23 DIAGNOSIS — G936 Cerebral edema: Secondary | ICD-10-CM | POA: Diagnosis present

## 2018-10-23 DIAGNOSIS — Z720 Tobacco use: Secondary | ICD-10-CM

## 2018-10-23 DIAGNOSIS — R569 Unspecified convulsions: Secondary | ICD-10-CM

## 2018-10-23 DIAGNOSIS — I5042 Chronic combined systolic (congestive) and diastolic (congestive) heart failure: Secondary | ICD-10-CM

## 2018-10-23 DIAGNOSIS — R54 Age-related physical debility: Secondary | ICD-10-CM | POA: Diagnosis present

## 2018-10-23 DIAGNOSIS — I429 Cardiomyopathy, unspecified: Secondary | ICD-10-CM

## 2018-10-23 DIAGNOSIS — I161 Hypertensive emergency: Secondary | ICD-10-CM | POA: Diagnosis present

## 2018-10-23 DIAGNOSIS — D689 Coagulation defect, unspecified: Secondary | ICD-10-CM | POA: Diagnosis present

## 2018-10-23 LAB — CBC
HCT: 41.3 % (ref 39.0–52.0)
Hemoglobin: 13.7 g/dL (ref 13.0–17.0)
MCH: 34.2 pg — ABNORMAL HIGH (ref 26.0–34.0)
MCHC: 33.2 g/dL (ref 30.0–36.0)
MCV: 103 fL — ABNORMAL HIGH (ref 80.0–100.0)
Platelets: 169 10*3/uL (ref 150–400)
RBC: 4.01 MIL/uL — ABNORMAL LOW (ref 4.22–5.81)
RDW: 12.8 % (ref 11.5–15.5)
WBC: 8.3 10*3/uL (ref 4.0–10.5)
nRBC: 0 % (ref 0.0–0.2)

## 2018-10-23 LAB — GLUCOSE, CAPILLARY
Glucose-Capillary: 125 mg/dL — ABNORMAL HIGH (ref 70–99)
Glucose-Capillary: 171 mg/dL — ABNORMAL HIGH (ref 70–99)
Glucose-Capillary: 171 mg/dL — ABNORMAL HIGH (ref 70–99)
Glucose-Capillary: 171 mg/dL — ABNORMAL HIGH (ref 70–99)

## 2018-10-23 LAB — BASIC METABOLIC PANEL
Anion gap: 10 (ref 5–15)
BUN: 13 mg/dL (ref 8–23)
CO2: 24 mmol/L (ref 22–32)
Calcium: 8.3 mg/dL — ABNORMAL LOW (ref 8.9–10.3)
Chloride: 108 mmol/L (ref 98–111)
Creatinine, Ser: 1.43 mg/dL — ABNORMAL HIGH (ref 0.61–1.24)
GFR calc Af Amer: 56 mL/min — ABNORMAL LOW (ref >60)
GFR calc non Af Amer: 48 mL/min — ABNORMAL LOW (ref >60)
Glucose, Bld: 151 mg/dL — ABNORMAL HIGH (ref 70–99)
Potassium: 3.7 mmol/L (ref 3.5–5.1)
Sodium: 142 mmol/L (ref 135–145)

## 2018-10-23 MED ORDER — ACETAMINOPHEN 325 MG PO TABS
650.0000 mg | ORAL_TABLET | Freq: Four times a day (QID) | ORAL | Status: DC | PRN
  Administered 2018-10-24 – 2018-10-25 (×3): 650 mg via ORAL
  Filled 2018-10-23 (×3): qty 2

## 2018-10-23 MED ORDER — BUTALBITAL-APAP-CAFFEINE 50-325-40 MG PO TABS
1.0000 | ORAL_TABLET | Freq: Three times a day (TID) | ORAL | Status: DC | PRN
  Administered 2018-10-24 – 2018-10-26 (×4): 1 via ORAL
  Filled 2018-10-23 (×4): qty 1

## 2018-10-23 MED ORDER — METFORMIN HCL 500 MG PO TABS
1000.0000 mg | ORAL_TABLET | Freq: Two times a day (BID) | ORAL | Status: DC
  Administered 2018-10-24 – 2018-10-27 (×8): 1000 mg via ORAL
  Filled 2018-10-23 (×8): qty 2

## 2018-10-23 MED ORDER — ONDANSETRON HCL 4 MG/2ML IJ SOLN
4.0000 mg | Freq: Four times a day (QID) | INTRAMUSCULAR | Status: DC | PRN
  Administered 2018-10-23 – 2018-10-25 (×3): 4 mg via INTRAVENOUS
  Filled 2018-10-23 (×3): qty 2

## 2018-10-23 NOTE — Progress Notes (Signed)
  Speech Language Pathology Treatment: Cognitive-Linquistic(Dysarthria)  Patient Details Name: Ronald [name] MRN: [id number] DOB: February 17, 1946 Today's Date: 10/23/2018 Time: 6237-6283 SLP Time Calculation (min) (ACUTE ONLY): 19 min  Assessment / Plan / Recommendation Clinical Impression  Pt was seen for dysarthria treatment and was cooperative during the session. He was educated regarding the nature of dysarthria, and compensatory strategies to improve speech intelligibility. Pt verbalized understanding regarding all areas of education. He used compensatory strategies at the phrase level with 80% accuracy increasing to 100% accuracy with min. cues for overarticulation. At the 5-7 word sentence level he demonstrated 70% accuracy increasing to 100% accuracy with min-mod cues for overarticulation. He required moderate cues during conversation. SLP will continue to follow pt.        HPI HPI: Ronald [name] is a 73 y.o. male who has past medical history of persistent atrial fibrillation, COPD with emphysema, hypertension, obstructive sleep apnea, hyperlipidemia, prostatic hyperplasia, colonic polyps, tobacco abuse, chronic congestive systolic and diastolic heart failure, presented to the emergency room via EMS as a code stroke for witnessed seizure activity for about 1 minute. Per chart wife reported pt was very confused and unable to talk well. MRI stable acute hemorrhage within right parafalcine frontal lobe well as associated edema and local mass effect.      SLP Plan     Patient needs continued Speech Lanaguage Pathology Services    Recommendations                   Follow up Recommendations: Outpatient SLP SLP Visit Diagnosis: Dysarthria and anarthria (R47.1)       Ronald [name] Ronald [name], [place], [place] Office number [phone] Pager [place] 10/23/2018, 1:51 PM

## 2018-10-23 NOTE — Progress Notes (Signed)
Occupational Therapy Treatment Patient Details Name: Ronald [name] MRN: [id number] DOB: 1945-06-04 Today's Date: 10/23/2018    History of present illness 73 y.o. male with PMHx including a-fib, COPD with emphysema, HTN, chronic congestive systolic and diastolic heart failure, DM who presented to the emergency room via EMS as a code stroke for witnessed seizure activity for about 1 minute. CT-scan of the brain-5.6 cm revealed right frontal lobe parenchymal hemorrhage with mild edema and 6 mm local leftward midline shift.     OT comments  Pt currently supervision for grooming tasks at the sink.  Still limited by nausea and headache throughout session.  He demonstrated decreased memory with being only able to recall 1/3 words after 5 min delay on 2 occasions.  Will continue to follow with hopes of greater ability to participate when nausea and pain are better controlled.  If discharge home at this time would recommend Outpatient OT for further treatment and eval.  Will update discharge plan as needed with further treatment.    Follow Up Recommendations  Supervision/Assistance - 24 hour;Outpatient OT    Equipment Recommendations  None recommended by OT       Precautions / Restrictions Precautions Precautions: Fall Restrictions Weight Bearing Restrictions: No       Mobility Bed Mobility               General bed mobility comments: up in recliner  Transfers Overall transfer level: Needs assistance Equipment used: None Transfers: Sit to/from Stand Sit to Stand: Supervision         General transfer comment: for safety and balance    Balance Overall balance assessment: Mild deficits observed, not formally tested                                         ADL either performed or assessed with clinical judgement   ADL Overall ADL's : Needs assistance/impaired     Grooming: Oral care;Brushing hair;Supervision/safety;Standing                               Functional mobility during ADLs: Supervision/safety General ADL Comments: Pt still with headache and some nausea which limited session.  He demonstrated good awareness of day of the week and month but was off by four days with regards to the day of the month.  He was able to demonstrate intellectual awareness of place and current situation.  He was able to ambulate over to the sink with supervision and complete grooming tasks while nursing adminstered pain meds, with nausea medications also given through IV prior to transfer.  Did not pursue further therapy as pt vomited earlier with PT and was reporting not feeling well at this time.               Cognition Arousal/Alertness: Awake/alert Behavior During Therapy: WFL for tasks assessed/performed Overall Cognitive Status: Impaired/Different from baseline Area of Impairment: Memory                     Memory: Decreased short-term memory         General Comments: Pt with recall of only 1/3 words after 5 min delay on two seperate occasions.              General Comments patient becoming nauseous in hallway - required seated rest with patient  vomitting - escorted back to room in w/c    Pertinent Vitals/ Pain       Pain Assessment: Faces Pain Score: 0-No pain Faces Pain Scale: Hurts little more Pain Location: head Pain Descriptors / Indicators: Headache Pain Intervention(s): Limited activity within patient's tolerance;RN gave pain meds during session         Frequency  Min 2X/week        Progress Toward Goals  OT Goals(current goals can now be found in the care plan section)  Progress towards OT goals: Progressing toward goals  Acute Rehab OT Goals Patient Stated Goal: wants something to drink  Plan Discharge plan remains appropriate       AM-PAC OT "6 Clicks" Daily Activity     Outcome Measure   Help from another person eating meals?: None Help from another person taking care of personal  grooming?: A Little Help from another person toileting, which includes using toliet, bedpan, or urinal?: A Little Help from another person bathing (including washing, rinsing, drying)?: A Little Help from another person to put on and taking off regular upper body clothing?: None Help from another person to put on and taking off regular lower body clothing?: A Little 6 Click Score: 20    End of Session    OT Visit Diagnosis: Unsteadiness on feet (R26.81);Muscle weakness (generalized) (M62.81);Other symptoms and signs involving cognitive function   Activity Tolerance Patient limited by pain   Patient Left in chair;with call bell/phone within reach;with chair alarm set   Nurse Communication Mobility status        Time: 4401-0272 OT Time Calculation (min): 37 min  Charges: OT General Charges $OT Visit: 1 Visit OT Treatments $Self Care/Home Management : 23-37 mins   Deyonte Cadden OTR/L 10/23/2018, 4:25 PM

## 2018-10-23 NOTE — Plan of Care (Signed)
  Problem: Education: Goal: Knowledge of disease or condition will improve Outcome: Progressing Goal: Knowledge of secondary prevention will improve Outcome: Progressing Goal: Knowledge of patient specific risk factors addressed and post discharge goals established will improve Outcome: Progressing Goal: Individualized Educational Video(s) Outcome: Progressing   Problem: Coping: Goal: Will verbalize positive feelings about self Outcome: Progressing Goal: Will identify appropriate support needs Outcome: Progressing   Problem: Health Behavior/Discharge Planning: Goal: Ability to manage health-related needs will improve Outcome: Progressing   Problem: Self-Care: Goal: Ability to participate in self-care as condition permits will improve Outcome: Progressing Goal: Verbalization of feelings and concerns over difficulty with self-care will improve Outcome: Progressing Goal: Ability to communicate needs accurately will improve Outcome: Progressing   Problem: Nutrition: Goal: Risk of aspiration will decrease Outcome: Progressing Goal: Dietary intake will improve Outcome: Progressing   Problem: Intracerebral Hemorrhage Tissue Perfusion: Goal: Complications of Intracerebral Hemorrhage will be minimized Outcome: Progressing   Problem: Ischemic Stroke/TIA Tissue Perfusion: Goal: Complications of ischemic stroke/TIA will be minimized Outcome: Progressing   Problem: Spontaneous Subarachnoid Hemorrhage Tissue Perfusion: Goal: Complications of Spontaneous Subarachnoid Hemorrhage will be minimized Outcome: Progressing   Problem: Education: Goal: Knowledge of General Education information will improve Description: Including pain rating scale, medication(s)/side effects and non-pharmacologic comfort measures Outcome: Progressing   Problem: Health Behavior/Discharge Planning: Goal: Ability to manage health-related needs will improve Outcome: Progressing   Problem: Clinical  Measurements: Goal: Ability to maintain clinical measurements within normal limits will improve Outcome: Progressing Goal: Will remain free from infection Outcome: Progressing Goal: Diagnostic test results will improve Outcome: Progressing Goal: Respiratory complications will improve Outcome: Progressing Goal: Cardiovascular complication will be avoided Outcome: Progressing   Problem: Activity: Goal: Risk for activity intolerance will decrease Outcome: Progressing   Problem: Nutrition: Goal: Adequate nutrition will be maintained Outcome: Progressing   Problem: Coping: Goal: Level of anxiety will decrease Outcome: Progressing   Problem: Elimination: Goal: Will not experience complications related to bowel motility Outcome: Progressing Goal: Will not experience complications related to urinary retention Outcome: Progressing   Problem: Pain Managment: Goal: General experience of comfort will improve Outcome: Progressing   Problem: Safety: Goal: Ability to remain free from injury will improve Outcome: Progressing   Problem: Skin Integrity: Goal: Risk for impaired skin integrity will decrease Outcome: Progressing

## 2018-10-23 NOTE — Progress Notes (Signed)
Physical Therapy Treatment Patient Details Name: Ronald [name] MRN: [id number] DOB: Aug 11, 1945 Today's Date: 10/23/2018    History of Present Illness 73 y.o. male with PMHx including a-fib, COPD with emphysema, HTN, chronic congestive systolic and diastolic heart failure, DM who presented to the emergency room via EMS as a code stroke for witnessed seizure activity for about 1 minute. CT-scan of the brain-5.6 cm revealed right frontal lobe parenchymal hemorrhage with mild edema and 6 mm local leftward midline shift.      PT Comments    Patient seen for mobility progression. VSS in session. Noted 2 lateral LOB with gait towards L - required Min A with patient reaching for objects for increased stability. During gait patient became nauseous - required seated rest break with patient vomiting with PT assist patient back to room in w/c - nursing aware. PT to continue to follow to progress patient as tolerated.     Follow Up Recommendations  Outpatient PT     Equipment Recommendations  None recommended by PT    Recommendations for Other Services       Precautions / Restrictions Precautions Precautions: Fall Restrictions Weight Bearing Restrictions: No    Mobility  Bed Mobility               General bed mobility comments: up in recliner  Transfers Overall transfer level: Needs assistance Equipment used: None Transfers: Sit to/from Stand Sit to Stand: Min guard         General transfer comment: for safety and balance  Ambulation/Gait Ambulation/Gait assistance: Min guard;Min assist Gait Distance (Feet): 150 Feet Assistive device: None Gait Pattern/deviations: Step-through pattern;Decreased stride length Gait velocity: decreased   General Gait Details: 2 LOB towards L - required reaching for bed/handrail for support, otherwise stable gait   Stairs             Wheelchair Mobility    Modified Rankin (Stroke Patients Only) Modified Rankin (Stroke  Patients Only) Pre-Morbid Rankin Score: No symptoms Modified Rankin: Moderately severe disability     Balance Overall balance assessment: Mild deficits observed, not formally tested                                          Cognition Arousal/Alertness: Awake/alert Behavior During Therapy: WFL for tasks assessed/performed Overall Cognitive Status: Within Functional Limits for tasks assessed                                 General Comments: some slow processing; easily distracted by phone and educational handouts      Exercises      General Comments General comments (skin integrity, edema, etc.): patient becoming nauseous in hallway - required seated rest with patient vomitting - escorted back to room in w/c      Pertinent Vitals/Pain Pain Assessment: Faces Pain Score: 3  Faces Pain Scale: Hurts little more Pain Location: head Pain Descriptors / Indicators: Headache Pain Intervention(s): Limited activity within patient's tolerance;Monitored during session;Repositioned    Home Living     Available Help at Discharge: Family;Available 24 hours/day Type of Home: House              Prior Function            PT Goals (current goals can now be found in the care plan section) Acute  Rehab PT Goals Patient Stated Goal: wants something to drink PT Goal Formulation: With patient Time For Goal Achievement: 11/05/18 Potential to Achieve Goals: Good Progress towards PT goals: Progressing toward goals    Frequency    Min 4X/week      PT Plan Current plan remains appropriate    Co-evaluation              AM-PAC PT "6 Clicks" Mobility   Outcome Measure  Help needed turning from your back to your side while in a flat bed without using bedrails?: None Help needed moving from lying on your back to sitting on the side of a flat bed without using bedrails?: None Help needed moving to and from a bed to a chair (including a  wheelchair)?: A Little Help needed standing up from a chair using your arms (e.g., wheelchair or bedside chair)?: A Little Help needed to walk in hospital room?: A Little Help needed climbing 3-5 steps with a railing? : A Little 6 Click Score: 20    End of Session Equipment Utilized During Treatment: Gait belt Activity Tolerance: Patient tolerated treatment well Patient left: in chair;with call bell/phone within reach;with chair alarm set Nurse Communication: Mobility status PT Visit Diagnosis: Unsteadiness on feet (R26.81)     Time: 1410-1430 PT Time Calculation (min) (ACUTE ONLY): 20 min  Charges:  $Gait Training: 8-22 mins                      Lanney Gins, PT, DPT Supplemental Physical Therapist 10/23/18 3:07 PM Pager: [phone] Office: [phone]

## 2018-10-23 NOTE — Evaluation (Signed)
Speech Language Pathology Evaluation Patient Details Name: Ronald [name] MRN: [id number] DOB: [birth year] Today's Date: 10/23/2018 Time: 8889-1694 SLP Time Calculation (min) (ACUTE ONLY): 37 min  Problem List:  Patient Active Problem List   Diagnosis Date Noted  . ICH (intracerebral hemorrhage) ([place]) 10/21/2018  . Hepatic steatosis 04/22/2018  . Actinic keratoses 06/07/2017  . BPH (benign prostatic hyperplasia) 05/30/2017  . Cardiomyopathy due to systemic disease ([place]) 03/09/2017  . Preop cardiovascular exam 03/07/2017  . Hypervolemia   . Chronic combined systolic and diastolic CHF (congestive heart failure) ([place])   . Hypoxia 02/21/2017  . ARF (acute renal failure) ([place])   . Hypomagnesemia 02/20/2017  . GI bleed 02/20/2017  . Severe sepsis ([place]) 02/19/2017  . History of colonic polyps 07/03/2016  . Chronic anticoagulation 07/03/2016  . Hyperglycemia, drug-induced 04/05/2015  . 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 ([place]) 02/21/2015  . On amiodarone therapy 12/08/2014  . Edema of both legs 12/08/2014  . Exertional dyspnea 08/18/2014  . Hypokalemia   . Tobacco abuse   . Paroxysmal atrial fibrillation ([place]): CHA2DS2-VASc Score 3 - On Eliquis 05/04/2014  . Cigarette smoker two packs a day or less   . Prostate cancer ([place]) 10/15/2013  . Obesity (BMI 30-39.9) 04/17/2013  . Umbilical hernia 50/38/8828  . Right inguinal hernia 06/23/2012  . Hydrocele 06/19/2012  . Metabolic syndrome 00/34/9179  . Type 2 diabetes mellitus with hyperglycemia ([place]) 03/20/2012  . Elevated PSA 03/20/2012  . GOUT, UNSPECIFIED 10/07/2009  . TINEA VERSICOLOR 07/19/2009  . PERS HX TOBACCO USE PRESENTING HAZARDS HEALTH 07/19/2009  . Obstructive sleep apnea 07/02/2008  . Hyperlipidemia with target LDL less than 70 07/01/2008  . Essential hypertension 07/01/2008  . ALLERGIC RHINITIS 07/01/2008   Past Medical History:  Past Medical History:  Diagnosis Date  . Allergic rhinitis    . Anticoagulant long-term use    eliquis  . Cardiomyopathy due to systemic disease [facility])    followed by dr harding  . COPD with emphysema [facility])    pulmologist-  dr Halford Chessman  . Dyspnea    occasional per pt  . History of colon polyps    tubular adenoma 2013  . History of gout    09-05-2017 last flare-up  05/ 2019 3 wks ago, feet  . History of sepsis 02/18/2017   per d/c note probable uti, acute chf, acute renal failure, hypoxia  . Hyperlipidemia   . Hyperplasia of prostate with lower urinary tract symptoms (LUTS)   . Hypertension   . OSA on CPAP    per study 08-03-2004  Severe OSA  . Persistent atrial fibrillation    cardiologist --  dr Dorris Carnes--  post cardioversion 05-07-2014  . Prostate cancer [facility]) urologsit-  dr Alyson Ingles--- as of 05-21-2017 per pt last PSA 11 approx.   Dx  2014--  stage T1c, Gleason 3+3=6, PSA 6.67--  Active surveillance/  04/ 2019  Stage T1b, Gleason 3+4, PSA 12.8- plan external radiation therapy    . 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 ([place])    pulmologist-  dr Halford Chessman  . Seasonal allergies   . Sigmoid diverticulosis   . Systolic and diastolic CHF, chronic [facility])    cardiologist-  [name]  . Tinea versicolor   . Type 2 diabetes mellitus ([place])   . Wears hearing aid in both ears    Past Surgical History:  Past Surgical History:  Procedure Laterality Date  . CARDIOVERSION N/A 05/07/2014   Procedure: CARDIOVERSION;  Surgeon: [name], MD;  Location: [place];  Service: Cardiovascular;  Laterality: N/A;  . CARDIOVERSION N/A 09/09/2014   Procedure: CARDIOVERSION;  Surgeon: Lelon Perla, MD;  Location: [facility] ENDOSCOPY;  Service: Cardiovascular;  Laterality: N/A;  successfully  . CATARACT EXTRACTION W/ INTRAOCULAR LENS  IMPLANT, BILATERAL  08 and 09/  2018  . COLONOSCOPY  last one 06-07-2011  . GOLD SEED IMPLANT N/A 09/09/2017   Procedure: GOLD SEED IMPLANT;  Surgeon: [name], MD;  Location: [facility];   Service: Urology;  Laterality: N/A;  . HYDROCELE EXCISION Bilateral 09/26/2015   Procedure: HYDROCELECTOMY ADULT;  Surgeon: [name], MD;  Location: [facility];  Service: Urology;  Laterality: Bilateral;  . LAMINECTOMY AND MICRODISCECTOMY LUMBAR SPINE  12-23-2003   Left L5 -- S1  . LAPAROSCOPIC BILATERAL INGUINAL HERNIA REPAIR/  UMBILICAL HERNIA REPAIR WITH MESH/  ASPIRATION LEFT HYDROCELE  07-11-2012  . NM MYOVIEW LTD  05/18/2014   Low risk study. Normal perfusion: No ischemia or infarction. Mild LV dysfunction - 46% (does not correlate with echocardiographic EF of 50-55%)  . PROSTATE BIOPSY  05/15/12   Clinically both Lobes  . SPACE OAR INSTILLATION N/A 09/09/2017   Procedure: SPACE OAR INSTILLATION;  Surgeon: [name], MD;  Location[facility]  Service: Urology;  Laterality: N/A;  . TEE WITHOUT CARDIOVERSION N/A 05/07/2014   Procedure: TRANSESOPHAGEAL ECHOCARDIOGRAM (TEE);  Surgeon: [name], MD;  Location: [facility] ENDOSCOPY;  Service: Cardiovascular;  Laterality: N/A;   mild atherosclerosis plaque of aorta,  mild AR, MR, and TR,  no cardiac source of emboli  . TONSILLECTOMY  as child  . TRANSTHORACIC ECHOCARDIOGRAM  11/'18; 1/'19   a) In setting of sepsis: EF of 40-45%.  Diffuse hypokinesis.  No RWMA.  Biatrial enlargement.;; b) ** f/u Jan 2019: Normal LVF 55-60%** , mildly dilated aortic root(38 mm) and ascending aorta (44 mm). Compared to prior echo, LVEF has improved.  . TRANSURETHRAL RESECTION OF PROSTATE N/A 05/30/2017   Procedure: TRANSURETHRAL RESECTION OF THE PROSTATE (TURP);  Surgeon: [name], MD;  Location: [facility];  Service: Urology;  Laterality: N/A;   HPI:  Ronald [name] is a 73 y.o. male who has past medical history of persistent atrial fibrillation, COPD with emphysema, hypertension, obstructive sleep apnea, hyperlipidemia, prostatic hyperplasia, colonic polyps, tobacco abuse, chronic congestive systolic and  diastolic heart failure, presented to the emergency room via EMS as a code stroke for witnessed seizure activity for about 1 minute. Per chart wife reported pt was very confused and unable to talk well. MRI stable acute hemorrhage within right parafalcine frontal lobe well as associated edema and local mass effect.   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 He stated that he was independent prior to admission and did not have any significant deficits in speech, language or cognition but has noticed some increased difficulty with memory with general aging. He stated that his language and cognition are currently at baseline but described his speech as "slurred" and stated that it is currently 75% back to baseline. The [facility] Cognitive Assessment 8.1 was completed to evaluate the pt's cognitive-linguistic skills. He achieved a score of 26/30 which is within the normal limits of 26 or more out of 30. The four points missed were during the delayed recall section and the pt indicated that he would have likely had difficulty with that activity prior to admission.  Pt demonstrated mild dysarthria characterized by imprecise articulation which inconsistently reduced speech intelligibility during conversation. Skilled SLP services are clinically indicated at this time to improve motor speech skills. Considering pt's high level of cognitive functioning prior to admission, it may be beneficial for a more in-depth cognitive-linguistic assessment to be conducted at the next level of care. Pt, and nursing were educated regarding results and recommendations; both parties verbalized understanding as well as agreement with plan of care.    SLP Assessment  SLP Recommendation/Assessment: Patient needs continued Speech Lanaguage Pathology Services SLP Visit Diagnosis: Dysarthria and anarthria (R47.1)    Follow Up Recommendations   Outpatient SLP    Frequency and Duration min 2x/week  2 weeks      SLP Evaluation Cognition  Overall Cognitive Status: Within Functional Limits for tasks assessed Arousal/Alertness: Awake/alert Orientation Level: Oriented X4 Attention: Sustained;Focused Focused Attention: Appears intact(Vigilance WNL: 1/1) Sustained Attention: Appears intact(Serial 7s: 3/3) Memory: Impaired Memory Impairment: Storage deficit;Retrieval deficit;Decreased recall of new information(Immediate: 5/5; delayed: 1/5 with cues: 4/4) Awareness: Appears intact Problem Solving: Appears intact Executive Function: Reasoning;Sequencing;Organizing Reasoning: Appears intact(Abstraction: 2/2) Sequencing: Appears intact(Clock drawing: 3/3) Organizing: Appears intact(Backward digit span: 1/1)       Comprehension  Auditory Comprehension Overall Auditory Comprehension: Appears within functional limits for tasks assessed Yes/No Questions: Within Functional Limits Commands: Within Functional Limits Complex Commands: (Trail completion: 1/1) Conversation: Diplomatic Services operational officer Discrimination: Within Function Limits Reading Comprehension Reading Status: Within funtional limits    Expression Expression Primary Mode of Expression: Verbal Verbal Expression Overall Verbal Expression: Appears within functional limits for tasks assessed Initiation: No impairment Level of Generative/Spontaneous Verbalization: Conversation Repetition: No impairment(Sentnece: 2/2) Naming: No impairment Confrontation: Within functional limits(3/3) Divergent: (1/1) Pragmatics: No impairment Written Expression Dominant Hand: Left Written Expression: (Copying cube: 1/1)   Oral / Motor  Motor Speech Overall Motor Speech: Impaired(Approximately 75-80% back to baseline) Respiration: Within functional limits Phonation: Normal Resonance: Within functional limits Articulation: Impaired Level of Impairment:  Sentence Intelligibility: Intelligible Motor Planning: Witnin functional limits Motor Speech Errors: Not applicable   Gennie Dib I. Hardin Negus, [place], [place] Office number [phone] Pager [place] 10/23/2018, 1:29 PM

## 2018-10-23 NOTE — Discharge Summary (Addendum)
Stroke Discharge Summary  Patient ID: Ronald [name]   MRN: [id number]      DOB: 03-31-1946  Date of Admission: 10/21/2018 Date of Discharge: 10/27/2018  Attending Physician:  Garvin Fila, MD, Stroke MD Consultant(s):    None  Patient's PCP:  [name], MD  DISCHARGE DIAGNOSIS:  Principal Problem:   ICH (intracerebral hemorrhage) ([place]) w/ SAH while on Eliquis Active Problems:   Hyperlipidemia with target LDL less than 70   Obstructive sleep apnea   Essential hypertension   Type 2 diabetes mellitus with hyperglycemia ([place])   Obesity (BMI 30-39.9)   Paroxysmal atrial fibrillation ([place]): CHA2DS2-VASc Score 3 - On Eliquis   Tobacco abuse   Chronic anticoagulation   Chronic combined systolic and diastolic CHF (congestive heart failure) ([place])   Cardiomyopathy due to systemic disease (H[facility])   Seizures ([place]), secondsry to ICH   Coagulopathy ([place]), Eliquis   Cerebral edema ([place])   Hypertensive emergency   Advanced age   Past Medical History:  Diagnosis Date  . Allergic rhinitis   . Anticoagulant long-term use    eliquis  . Cardiomyopathy due to systemic disease [facility])    followed by dr harding  . COPD with emphysema [facility])    pulmologist-  dr Halford Chessman  . Dyspnea    occasional per pt  . History of colon polyps    tubular adenoma 2013  . History of gout    09-05-2017 last flare-up  05/ 2019 3 wks ago, feet  . History of sepsis 02/18/2017   per d/c note probable uti, acute chf, acute renal failure, hypoxia  . Hyperlipidemia   . Hyperplasia of prostate with lower urinary tract symptoms (LUTS)   . Hypertension   . OSA on CPAP    per study 08-03-2004  Severe OSA  . Persistent atrial fibrillation    cardiologist --  dr Dorris Carnes--  post cardioversion 05-07-2014  . Prostate cancer [facility]) urologsit-  dr Alyson Ingles--- as of 05-21-2017 per pt last PSA 11 approx.   Dx  2014--  stage T1c, Gleason 3+3=6, PSA 6.67--  Active surveillance/  04/ 2019  Stage T1b, Gleason 3+4, PSA  12.8- plan external radiation therapy    . 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 Seasonal allergies   . Sigmoid diverticulosis   . Systolic and diastolic CHF, chronic [facility])    cardiologist-  [name]  . Tinea versicolor   . Type 2 diabetes mellitus ([place])   . Wears hearing aid in both ears    Past Surgical History:  Procedure Laterality Date  . CARDIOVERSION N/A 05/07/2014   Procedure: CARDIOVERSION;  Surgeon: [name], MD;  Location: [facility] ENDOSCOPY;  Service: Cardiovascular;  Laterality: N/A;  . CARDIOVERSION N/A 09/09/2014   Procedure: CARDIOVERSION;  Surgeon: Lelon Perla, MD;  Location: [facility] ENDOSCOPY;  Service: Cardiovascular;  Laterality: N/A;  successfully  . CATARACT EXTRACTION W/ INTRAOCULAR LENS  IMPLANT, BILATERAL  08 and 09/  2018  . COLONOSCOPY  last one 06-07-2011  . GOLD SEED IMPLANT N/A 09/09/2017   Procedure: GOLD SEED IMPLANT;  Surgeon: [name], MD;  Location: [facility];  Service: Urology;  Laterality: N/A;  . HYDROCELE EXCISION Bilateral 09/26/2015   Procedure: HYDROCELECTOMY ADULT;  Surgeon: [name], MD;  Location: [facility];  Service: Urology;  Laterality: Bilateral;  . LAMINECTOMY AND MICRODISCECTOMY LUMBAR SPINE  12-23-2003  Left L5 -- S1  . LAPAROSCOPIC BILATERAL INGUINAL HERNIA REPAIR/  UMBILICAL HERNIA REPAIR WITH MESH/  ASPIRATION LEFT HYDROCELE  07-11-2012  . NM MYOVIEW LTD  05/18/2014   Low risk study. Normal perfusion: No ischemia or infarction. Mild LV dysfunction - 46% (does not correlate with echocardiographic EF of 50-55%)  . PROSTATE BIOPSY  05/15/12   Clinically both [place]  . SPACE OAR INSTILLATION N/A 09/09/2017   Procedure: SPACE OAR INSTILLATION;  Surgeon: [name], MD;  Location: [facility];  Service: Urology;  Laterality: N/A;  . TEE WITHOUT CARDIOVERSION N/A 05/07/2014   Procedure: TRANSESOPHAGEAL  ECHOCARDIOGRAM (TEE);  Surgeon: [name], MD;  Location: [facility] ENDOSCOPY;  Service: Cardiovascular;  Laterality: N/A;   mild atherosclerosis plaque of aorta,  mild AR, MR, and TR,  no cardiac source of emboli  . TONSILLECTOMY  as child  . TRANSTHORACIC ECHOCARDIOGRAM  11/'18; 1/'19   a) In setting of sepsis: EF of 40-45%.  Diffuse hypokinesis.  No RWMA.  Biatrial enlargement.;; b) ** f/u Jan 2019: Normal LVF 55-60%** , mildly dilated aortic root(38 mm) and ascending aorta (44 mm). Compared to prior echo, LVEF has improved.  . TRANSURETHRAL RESECTION OF PROSTATE N/A 05/30/2017   Procedure: TRANSURETHRAL RESECTION OF THE PROSTATE (TURP);  Surgeon: [name], MD;  Location: [facility];  Service: Urology;  Laterality: N/A;    Allergies as of 10/27/2018   No Known Allergies     Medication List    STOP taking these medications   Eliquis 5 MG Tabs tablet Generic drug: apixaban     TAKE these medications   Accu-Chek Aviva Plus w/Device Kit Use blood glucose machine as instructed on your strips   accu-chek soft touch lancets Check blood sugars once per day. DX: E11.9   albuterol 108 (90 Base) MCG/ACT inhaler Commonly known as: Ventolin HFA Inhale 2 puffs into the lungs every 6 (six) hours as needed for wheezing or shortness of breath.   amiodarone 200 MG tablet Commonly known as: PACERONE TAKE 1 TABLET BY MOUTH EVERY DAY   aspirin 81 MG EC tablet Take 1 tablet (81 mg total) by mouth daily. Start taking on: October 28, 2018   atorvastatin 10 MG tablet Commonly known as: LIPITOR TAKE 1 TABLET BY MOUTH DAILY EACH EVENING What changed: See the new instructions.   azelastine 0.1 % nasal spray Commonly known as: ASTELIN Place 1 spray into both nostrils 2 (two) times daily as needed for rhinitis or allergies.   budesonide-formoterol 160-4.5 MCG/ACT inhaler Commonly known as: Symbicort Inhale 2 puffs into the lungs 2 (two) times daily.   butalbital-acetaminophen-caffeine 50-325-40  MG tablet Commonly known as: FIORICET Take 1 tablet by mouth every 8 (eight) hours as needed for headache.   carvedilol 12.5 MG tablet Commonly known as: COREG TAKE 1 TABLET (12.5 MG TOTAL) BY MOUTH 2 (TWO) TIMES DAILY.   colchicine 0.6 MG tablet Take 1-2 tablets (0.6-1.2 mg total) by mouth See admin instructions. Take 1.2 mg by mouth at onset of gout flare, then 0.6 mg two times a day as needed for flare(s)   furosemide 40 MG tablet Commonly known as: LASIX TAKE 1-2 TABLETS DAILY AS NEEDED. What changed: See the new instructions.   glucose blood test strip Commonly known as: Accu-Chek Aviva Plus Test 2 times daily dx e11.9   Klor-Con M20 20 MEQ tablet Generic drug: potassium chloride SA TAKE 2 TABLETS BY MOUTH EVERY DAY What changed: how much to take  levETIRAcetam 500 MG tablet Commonly known as: KEPPRA Take 1 tablet (500 mg total) by mouth 2 (two) times daily.   losartan 25 MG tablet Commonly known as: COZAAR TAKE 1 TABLET BY MOUTH EVERY DAY   metFORMIN 500 MG tablet Commonly known as: GLUCOPHAGE Take 2 tablets (1,000 mg total) by mouth 2 (two) times daily with a meal.   metoprolol tartrate 25 MG tablet Commonly known as: LOPRESSOR Take  1 tablet if  you have breakthrough atrial fib with extra dose of 200 mg Amiodarone. What changed:   how much to take  how to take this  when to take this  additional instructions   montelukast 10 MG tablet Commonly known as: SINGULAIR Take 1 tablet (10 mg total) by mouth every evening. What changed: See the new instructions.   nicotine 14 mg/24hr patch Commonly known as: NICODERM CQ - dosed in mg/24 hours Place 1 patch (14 mg total) onto the skin daily. Start taking on: October 28, 2018   Ozempic (0.25 or 0.5 MG/DOSE) 2 MG/1.5ML Sopn Generic drug: Semaglutide(0.25 or 0.5MG/DOS) Inject 0.5 mg into the skin every Sunday. Start taking on: November 02, 2018   PRESCRIPTION MEDICATION CPAP- At bedtime   triamcinolone cream  0.1 % Commonly known as: KENALOG Apply 1 application topically daily as needed (to affected sites).       LABORATORY STUDIES CBC    Component Value Date/Time   WBC 7.0 10/25/2018 0331   RBC 4.25 10/25/2018 0331   HGB 14.4 10/25/2018 0331   HCT 42.7 10/25/2018 0331   PLT 198 10/25/2018 0331   MCV 100.5 (H) 10/25/2018 0331   MCH 33.9 10/25/2018 0331   MCHC 33.7 10/25/2018 0331   RDW 12.4 10/25/2018 0331   LYMPHSABS 1.8 10/21/2018 1709   MONOABS 0.8 10/21/2018 1709   EOSABS 0.0 10/21/2018 1709   BASOSABS 0.1 10/21/2018 1709   CMP    Component Value Date/Time   NA 136 10/26/2018 0605   K 3.4 (L) 10/26/2018 0605   CL 100 10/26/2018 0605   CO2 27 10/26/2018 0605   GLUCOSE 173 (H) 10/26/2018 0605   BUN 16 10/26/2018 0605   CREATININE 1.33 (H) 10/26/2018 0605   CREATININE 0.96 03/21/2015 1022   CALCIUM 8.3 (L) 10/26/2018 0605   PROT 5.8 (L) 10/22/2018 0223   ALBUMIN 3.5 10/22/2018 0223   AST 17 10/22/2018 0223   ALT 22 10/22/2018 0223   ALKPHOS 66 10/22/2018 0223   BILITOT 0.8 10/22/2018 0223   GFRNONAA 53 (L) 10/26/2018 0605   GFRNONAA 80 03/21/2015 1022   GFRAA >60 10/26/2018 0605   GFRAA >89 03/21/2015 1022   COAGS Lab Results  Component Value Date   INR 1.2 10/21/2018   INR 1.30 02/19/2017   INR 1.10 05/04/2014   Lipid Panel    Component Value Date/Time   CHOL 173 10/21/2018 1828   TRIG 306 (H) 10/21/2018 1828   HDL 35 (L) 10/21/2018 1828   CHOLHDL 4.9 10/21/2018 1828   VLDL 61 (H) 10/21/2018 1828   LDLCALC 77 10/21/2018 1828   HgbA1C  Lab Results  Component Value Date   HGBA1C 6.9 (H) 10/21/2018   Urinalysis    Component Value Date/Time   COLORURINE YELLOW 02/19/2017 0001   APPEARANCEUR CLOUDY (A) 02/19/2017 0001   LABSPEC 1.020 02/19/2017 0001   PHURINE 5.5 02/19/2017 0001   GLUCOSEU NEGATIVE 02/19/2017 0001   HGBUR LARGE (A) 02/19/2017 0001   BILIRUBINUR NEGATIVE 02/19/2017 0001   BILIRUBINUR n 03/10/2012 0981  KETONESUR 15 (A)  02/19/2017 0001   PROTEINUR NEGATIVE 02/19/2017 0001   UROBILINOGEN 1.0 05/04/2014 1123   NITRITE NEGATIVE 02/19/2017 0001   LEUKOCYTESUR SMALL (A) 02/19/2017 0001   Urine Drug Screen No results found for: LABOPIA, COCAINSCRNUR, LABBENZ, AMPHETMU, THCU, LABBARB  Alcohol Level No results found for: [place] DIAGNOSTIC STUDIES Ct Head Code Stroke Wo Contrast 10/21/2018  1. 5.6 cm right frontal lobe parenchymal hemorrhage with mild edema and 6 mm of local leftward midline shift. 2. Small volume subarachnoid hemorrhage.   Ct Angio Head W Or Wo Contrast Ct Angio Neck W Or Wo Contrast 10/21/2018 1. Mild intracranial atherosclerosis without large vessel occlusion, flow limiting proximal stenosis, or aneurysm. 2. Widely patent cervical carotid arteries. 3. Patent vertebral arteries with mild proximal stenosis on the right.   Ct Venogram Head 10/21/2018 Negative CT venogram.   Mr Jeri [facility] Contrast 10/22/2018 1. Stable acute hemorrhage within right parafalcine frontal lobe as well as associated edema and local mass effect. No abnormal enhancement to suggest underlying mass or vascular malformation. 2. Mild chronic microvascular ischemic changes and volume loss of the brain.  Ct Head Code Stroke Wo Contrast 10/23/2018  1. Stable medial right frontal lobe intra-axial and trace subarachnoid hemorrhage since 10/21/2018. Stable surrounding edema and mild regional mass effect. 2. No new intracranial abnormality.  2D Echocardiogram  10/22/2018 1. The left ventricle has hyperdynamic systolic function, with an ejection fraction of >65%. The cavity size was normal. There is moderate concentric left ventricular hypertrophy. Left ventricular diastolic Doppler parameters are consistent with  impaired relaxation. 2. The right ventricle has normal systolic function. The cavity was normal. There is no increase in right ventricular wall thickness. 3. Right atrial size was mildly dilated. 4.  No evidence of mitral valve stenosis. 5. No stenosis of the aortic valve. 6. There is mild dilatation of the ascending aorta. 7. The interatrial septum was not assessed.  EEG 10/22/2018 Normal electroencephalogram, awakeand drowsy. There are no focal lateralizing or epileptiform features.    HISTORY OF PRESENT ILLNESS Ronald [name] is a 73 y.o. male who has past medical history of persistent atrial fibrillation on Eliquis with last dose around 9 PM yesterday 10/20/2018, COPD with emphysema, hypertension, obstructive sleep apnea, hyperlipidemia, prostatic hyperplasia, colonic polyps, tobacco abuse, chronic congestive systolic and diastolic heart failure, presented to the emergency room via EMS as a code stroke for witnessed seizure activity for about 1 minute. The EMS was called for patient feeling generally sick and when they got there, his vitals were within normal limits but soon after, as they were examining him, he went into a generalized tonic-clonic seizure that lasted about 1 minute.  He was very drowsy and lethargic after that. EMS called over the emergency room charge as they were bringing him in and the charge nurse recommended that the activate an acute code stroke.  The wife reports that he had been feeling unwell ever since he woke up this morning but was still talking and it was not until 3 PM that she noted that he was very confused and unable to talk well.  The last she saw him normal, presumably was when he went to bed around 11 PM. (LKW: 11 PM on 10/20/2018.) She also says that he had been complaining of a mild headache behind his eyes for the last day or 2 but did not make much of it. He is retired.  Is able to take care of all his ADLs independently.  Walks without support. Premorbid modified Rankin scale (mRS):0  CT head showed a R frontal love IPH w/ mild edema and 48m shift. Small volume SAH. ICH score - 0. He was admitted to the neuro ICU.    HOSPITAL COURSE Mr. JGASPARE NETZELis a 73y.o. male with history of persistent atrial fibrillation on Eliquis, COPD with emphysema, hypertension, obstructive sleep apnea, hyperlipidemia, prostatic hyperplasia, colonic polyps, tobacco abuse, chronic congestive systolic and diastolic heart failure presenting with new onset general tonic-clonic seizure x1 minute.  Reported headache for 1-2 days  ICH: R frontal ICH with small SAH s/p Kcentra reversal in setting of Eliquis associated coagulopathy   Code Stroke CT head 5.6 cm R frontal lobe IPH with mild edema and 6 mm left shift.  Small volume SAH.  CTA head & neck no LVO.  Mild intracranial atherosclerosis.  Mild proximal stenosis R VA.  CTV negative  MRI  R parafalcine frontal ICH with edema and local mass-effect. No CAA  CT repeat 10/23/18 stable hematoma, and edema with mild mass-effect  2D Echo EF > 65%  EEG normal  LDL 77  HgbA1c 6.9  Eliquis (apixaban) daily prior to admission, now on aspirin 81 mg daily  Therapy recommendations:  OP PT/OT/SLP (too high level for CIR)  Disposition: return home  Seizure   No history of seizures  Loaded with Keppra  EEG normal  Continue keppra for now. Readdress at followup  Atrial Fibrillation  Home anticoagulation:  Eliquis (apixaban) daily   Eliquis on hold given ICH  On aspirin 81 mg daily at /c   On amiodarone  Cerebral edema, mild  Mild cerebral edema and MLS on CT and MRI  Does not feel he needs 3% saline  Off 3% now  NA 136-> 145->142->138->137->136  Hypertensive emergency  Home meds: Coreg 12.5 twice daily, losartan 25  Blood pressure as high as 174/101   Treated with Cleviprex in the ICU  Resumed home BP meds  Stable  Long-term BP goal normotensive  Hyperlipidemia  Home meds: Lipitor 10  LDL 77  Resume statin at d/c  Consider SATURN trial  Diabetes type II Controlled  Home meds: Metformin 500  HgbA1c 6.9, at goal < 7.0  Mild hyperglycemia  Home  metformin resumed  CBGs  PCP follow up  Tobacco abuse  Current smoker  Smoking cessation counseling provided  Pt is willing to quit   Other Stroke Risk Factors  Advanced age  ETOH use, advised to drink no more than 2 drink(s) a day  Obesity, Body mass index is 32.07 kg/m., recommend weight loss, diet and exercise as appropriate   Family hx stroke   Obstructive sleep apnea, on CPAP at home  Cardiomyopathy due to systemic disease  Chronic systolic and diastolic congestive heart failure on prn lasix PTA  Other Active Problems  COPD with emphysema  Prostate cancer  CKD stage II, Cre 1.40->1.40->1.43->1.25->1.20->1.33  Hypokalemia - 3.3 -> supplement ->3.4 -> repeat supplemen    DISCHARGE EXAM Blood pressure (!) 137/98, pulse 89, temperature 97.7 F (36.5 C), temperature source Oral, resp. rate 18, height '5\' 11"'  (1.803 m), weight 104.3 kg, SpO2 94 %. General - Well nourished, well developed elderly caucasian male, in no apparent distress.  Ophthalmologic - fundi not visualized due to noncooperation.  Cardiovascular - Regular rate and rhythm.  Mental Status -  Awake alert oriented to place and person.     Follows simple midline 1 and two-step commands.  Diminished recall 2/3.  Fund of Knowledge was assessed and was intact.  Cranial Nerves II - XII - II - Visual field intact OU. III, IV, VI - Extraocular movements intact. V - Facial sensation intact bilaterally. VII - Facial movement intact bilaterally. VIII - Hearing & vestibular intact bilaterally. X - Palate elevates symmetrically. XI - Chin turning & shoulder shrug intact bilaterally. XII - Tongue protrusion intact.  Motor Strength - The patient's strength was normal in all extremities and pronator drift was absent.  Bulk was normal and fasciculations were absent.   Motor Tone - Muscle tone was assessed at the neck and appendages and was normal.  Reflexes - The patient's reflexes were  symmetrical in all extremities and he had no pathological reflexes.  Sensory - Light touch, temperature/pinprick were assessed and were symmetrical.    Coordination - The patient had normal movements in the hands and feet with no ataxia or dysmetria.  Tremor was absent.  Gait and Station - deferred.  Discharge Diet   Heart healthy thin liquids  DISCHARGE PLAN  Disposition:  Home with wife   Outpatient PT, OT and SLP follow up  Aspirin 81 mg daily for secondary stroke prevention  Ongoing stroke risk factor control by Primary Care Physician at time of discharge  Follow-up [name], MD in 2 weeks.  Follow-up in [facility] in 4 weeks, office to schedule an appointment.   45 minutes were spent preparing discharge.  Burnetta Sabin, MSN, APRN, ANVP-BC, AGPCNP-BC Advanced Practice Stroke Nurse [place] for Schedule & Pager information 10/27/2018 3:31 PM  I have personally obtained history,examined this patient, reviewed notes, independently viewed imaging studies, participated in medical decision making and plan of care.ROS completed by me personally and pertinent positives fully documented  I have made any additions or clarifications directly to the above note. Agree with note above.   Antony Contras, MD Medical Director [facility] [place] [facility] Pager: [phone] 10/28/2018 2:08 PM

## 2018-10-23 NOTE — Plan of Care (Signed)
  Problem: Education: Goal: Knowledge of disease or condition will improve Outcome: Progressing Goal: Knowledge of secondary prevention will improve Outcome: Progressing Goal: Knowledge of patient specific risk factors addressed and post discharge goals established will improve Outcome: Progressing Goal: Individualized Educational Video(s) Outcome: Progressing   

## 2018-10-23 NOTE — Progress Notes (Addendum)
STROKE TEAM PROGRESS NOTE   INTERVAL HISTORY RN at bedside.  Patient had vomiting this morning before breakfast, however, no other symptoms.  Patient was able to continue finished breakfast.  In the afternoon patient also has another vomiting, and complaint of headache around the eyes.  CT head pending.  Vitals:   10/23/18 0353 10/23/18 0728 10/23/18 0908 10/23/18 1216  BP: (!) 159/99 (!) 171/94  (!) 144/78  Pulse: 70 65  67  Resp: 17 18 16 18   Temp: 98 F (36.7 C) 97.9 F (36.6 C)  98.3 F (36.8 C)  TempSrc: Oral Oral  Oral  SpO2: 95% 96%  91%  Weight:      Height:        CBC:  Recent Labs  Lab 10/21/18 1709  10/22/18 0223 10/23/18 0357  WBC 10.2  --  8.8 8.3  NEUTROABS 7.4  --   --   --   HGB 16.5   < > 15.1 13.7  HCT 49.7   < > 43.4 41.3  MCV 102.9*  --  98.0 103.0*  PLT 229  --  184 169   < > = values in this interval not displayed.    Basic Metabolic Panel:  Recent Labs  Lab 10/22/18 0223  10/22/18 1557 10/23/18 0357  NA 141   < > 145 142  K 3.7  --   --  3.7  CL 110  --   --  108  CO2 23  --   --  24  GLUCOSE 140*  --   --  151*  BUN 15  --   --  13  CREATININE 1.40*  --   --  1.43*  CALCIUM 8.2*  --   --  8.3*   < > = values in this interval not displayed.   Lipid Panel:     Component Value Date/Time   CHOL 173 10/21/2018 1828   TRIG 306 (H) 10/21/2018 1828   HDL 35 (L) 10/21/2018 1828   CHOLHDL 4.9 10/21/2018 1828   VLDL 61 (H) 10/21/2018 1828   LDLCALC 77 10/21/2018 1828   HgbA1c:  Lab Results  Component Value Date   HGBA1C 6.9 (H) 10/21/2018   Urine Drug Screen: No results found for: LABOPIA, COCAINSCRNUR, LABBENZ, AMPHETMU, THCU, LABBARB  Alcohol Level No results found for: ETH  IMAGING Ct Head Code Stroke Wo Contrast 10/21/2018 1. 5.6 cm right frontal lobe parenchymal hemorrhage with mild edema and 6 mm of local leftward midline shift. 2. Small volume subarachnoid hemorrhage.   Ct Angio Head W Or Wo Contrast Ct Angio Neck W Or Wo  Contrast 10/21/2018 1. Mild intracranial atherosclerosis without large vessel occlusion, flow limiting proximal stenosis, or aneurysm. 2. Widely patent cervical carotid arteries. 3. Patent vertebral arteries with mild proximal stenosis on the right.   Ct Venogram Head 10/21/2018 Negative CT venogram.   Mr Jeri Cos [facility] Contrast 10/22/2018 1. Stable acute hemorrhage within right parafalcine frontal lobe as well as associated edema and local mass effect. No abnormal enhancement to suggest underlying mass or vascular malformation. 2. Mild chronic microvascular ischemic changes and volume loss of the brain.  2D Echocardiogram  10/22/2018 1. The left ventricle has hyperdynamic systolic function, with an ejection fraction of >65%. The cavity size was normal. There is moderate concentric left ventricular hypertrophy. Left ventricular diastolic Doppler parameters are consistent with  impaired relaxation.  2. The right ventricle has normal systolic function. The cavity was normal. There is no increase in  right ventricular wall thickness.  3. Right atrial size was mildly dilated.  4. No evidence of mitral valve stenosis.  5. No stenosis of the aortic valve.  6. There is mild dilatation of the ascending aorta.  7. The interatrial septum was not assessed.  EEG 10/22/2018 Normal electroencephalogram, awake and drowsy. There are no focal lateralizing or epileptiform features.   PHYSICAL EXAM  Temp:  [97.5 F (36.4 C)-98.3 F (36.8 C)] 98.3 F (36.8 C) (07/16 1216) Pulse Rate:  [65-75] 67 (07/16 1216) Resp:  [16-23] 18 (07/16 1216) BP: (136-171)/(78-99) 144/78 (07/16 1216) SpO2:  [91 %-98 %] 91 % (07/16 1216)  General - Well nourished, well developed, in no apparent distress.  Ophthalmologic - fundi not visualized due to noncooperation.  Cardiovascular - Regular rate and rhythm.  Mental Status -  Level of arousal and orientation to time, place, and person were intact. Language including  expression, naming, repetition, comprehension was assessed and found intact. Mild psychomotor slowing Fund of Knowledge was assessed and was intact.  Cranial Nerves II - XII - II - Visual field intact OU. III, IV, VI - Extraocular movements intact. V - Facial sensation intact bilaterally. VII - Facial movement intact bilaterally. VIII - Hearing & vestibular intact bilaterally. X - Palate elevates symmetrically. XI - Chin turning & shoulder shrug intact bilaterally. XII - Tongue protrusion intact.  Motor Strength - The patient's strength was normal in all extremities and pronator drift was absent.  Bulk was normal and fasciculations were absent.   Motor Tone - Muscle tone was assessed at the neck and appendages and was normal.  Reflexes - The patient's reflexes were symmetrical in all extremities and he had no pathological reflexes.  Sensory - Light touch, temperature/pinprick were assessed and were symmetrical.    Coordination - The patient had normal movements in the hands and feet with no ataxia or dysmetria.  Tremor was absent.  Gait and Station - deferred.   ASSESSMENT/PLAN [name] is a 73 y.o. male with history of persistent atrial fibrillation on Eliquis, COPD with emphysema, hypertension, obstructive sleep apnea, hyperlipidemia, prostatic hyperplasia, colonic polyps, tobacco abuse, chronic congestive systolic and diastolic heart failure presenting with new onset general tonic-clonic seizure x1 minute.  Reported headache for 1-2 days  ICH: R frontal ICH with small SAH s/p Kcentra reversal in setting of Eliquis associated coagulopathy   Code Stroke CT head 5.6 cm R frontal lobe IPH with mild edema and 6 mm left shift.  Small volume SAH.  CTA head & neck no LVO.  Mild intracranial atherosclerosis.  Mild proximal stenosis R VA.  CTV negative  MRI  R parafalcine frontal ICH with edema and local mass-effect. No CAA  CT repeat 10/23/18 pending  2D Echo EF >  65%  EEG normal  LDL 77  HgbA1c 6.9  SCDs for VTE prophylaxis  Eliquis (apixaban) daily prior to admission, now on No antithrombotic given ICH.   Therapy recommendations:  OP PT/OT/SLP  Disposition: return home  Seizure   No history of seizures  Loaded with Keppra  EEG normal  Continue keppra for now  Atrial Fibrillation  Home anticoagulation:  Eliquis (apixaban) daily   Eliquis on hold given ICH  On amiodarone  Cerebral edema, mild  Mild cerebral edema and MLS on CT and MRI  Does not feel he needs 3% saline  Off 3% now  NA 136-> 145->142  Sodium level daily  Hypertensive emergency  Home meds: Coreg 12.5 twice daily, losartan  25  Blood pressure as high as 174/101   SBP goal < 160   off Cleviprex drip now  Resumed home BP meds  Stable . Long-term BP goal normotensive  Hyperlipidemia  Home meds: Lipitor 10  LDL 77  Statin on hold given hemorrhage   Consider SATURN trial  Diabetes type II Controlled  Home meds: Metformin 500  HgbA1c 6.9, at goal < 7.0  Home metformin resumed  CBGs  SSI  PCP follow up  Tobacco abuse  Current smoker  Smoking cessation counseling provided  Pt is willing to quit   Other Stroke Risk Factors  Advanced age  ETOH use, advised to drink no more than 2 drink(s) a day  Obesity, Body mass index is 32.07 kg/m., recommend weight loss, diet and exercise as appropriate   Family hx stroke   Obstructive sleep apnea, on CPAP at home  Cardiomyopathy due to systemic disease  Chronic systolic and diastolic congestive heart failure on prn lasix PTA  Other Active Problems  COPD with emphysema  Prostate cancer  CKD stage II, Cre 1.40->1.40->1.43  Hospital day # 2  I had long discussion with wife over the phone, updated pt current condition, treatment plan and potential prognosis. She expressed understanding and appreciation.    Rosalin Hawking, MD PhD Stroke Neurology 10/23/2018 2:17  PM   To contact Stroke Continuity provider, please refer to [email or web address] After hours, contact General Neurology

## 2018-10-24 DIAGNOSIS — G4733 Obstructive sleep apnea (adult) (pediatric): Secondary | ICD-10-CM

## 2018-10-24 LAB — BASIC METABOLIC PANEL
Anion gap: 10 (ref 5–15)
BUN: 12 mg/dL (ref 8–23)
CO2: 24 mmol/L (ref 22–32)
Calcium: 8.1 mg/dL — ABNORMAL LOW (ref 8.9–10.3)
Chloride: 104 mmol/L (ref 98–111)
Creatinine, Ser: 1.25 mg/dL — ABNORMAL HIGH (ref 0.61–1.24)
GFR calc Af Amer: >60 mL/min (ref >60)
GFR calc non Af Amer: 57 mL/min — ABNORMAL LOW (ref >60)
Glucose, Bld: 168 mg/dL — ABNORMAL HIGH (ref 70–99)
Potassium: 3.3 mmol/L — ABNORMAL LOW (ref 3.5–5.1)
Sodium: 138 mmol/L (ref 135–145)

## 2018-10-24 LAB — CBC
HCT: 39.5 % (ref 39.0–52.0)
Hemoglobin: 13.3 g/dL (ref 13.0–17.0)
MCH: 34.1 pg — ABNORMAL HIGH (ref 26.0–34.0)
MCHC: 33.7 g/dL (ref 30.0–36.0)
MCV: 101.3 fL — ABNORMAL HIGH (ref 80.0–100.0)
Platelets: 169 10*3/uL (ref 150–400)
RBC: 3.9 MIL/uL — ABNORMAL LOW (ref 4.22–5.81)
RDW: 12.6 % (ref 11.5–15.5)
WBC: 7.6 10*3/uL (ref 4.0–10.5)
nRBC: 0 % (ref 0.0–0.2)

## 2018-10-24 LAB — GLUCOSE, CAPILLARY
Glucose-Capillary: 160 mg/dL — ABNORMAL HIGH (ref 70–99)
Glucose-Capillary: 143 mg/dL — ABNORMAL HIGH (ref 70–99)
Glucose-Capillary: 137 mg/dL — ABNORMAL HIGH (ref 70–99)
Glucose-Capillary: 128 mg/dL — ABNORMAL HIGH (ref 70–99)

## 2018-10-24 NOTE — Progress Notes (Signed)
  Speech Language Pathology Treatment: Dysphagia  Patient Details Name: Ronald [name] MRN: [id number] DOB: [birth year] Today's Date: 10/24/2018 Time: 0623-7628 SLP Time Calculation (min) (ACUTE ONLY): 10 min  Assessment / Plan / Recommendation Clinical Impression  F/u for swallowing - pt has been tolerating a regular diet with thin liquids.  Observed today to consume solids and liquids in isolation and combination. Pt with sufficient mastication, the appearance of a brisk swallow response, and no s/s of aspiration.  His lungs are clear; remains afebrile and is on room air.  Recommend continuing a regular diet, thin liquids.  No further swallowing deficits identified.  SLP will sign off with regard to swallowing; pt will continue to need f/u to address speech clarity.   HPI HPI: Ronald [name] is a 73 y.o. male who has past medical history of persistent atrial fibrillation, COPD with emphysema, hypertension, obstructive sleep apnea, hyperlipidemia, prostatic hyperplasia, colonic polyps, tobacco abuse, chronic congestive systolic and diastolic heart failure, presented to the emergency room via EMS as a code stroke for witnessed seizure activity for about 1 minute. Per chart wife reported pt was very confused and unable to talk well. MRI stable acute hemorrhage within right parafalcine frontal lobe well as associated edema and local mass effect.      SLP Plan  Other (Comment)(no f/u for swallowing)       Recommendations  Diet recommendations: Regular;Thin liquid Medication Administration: Whole meds with liquid Supervision: Patient able to self feed                Oral Care Recommendations: Oral care BID SLP Visit Diagnosis: Dysphagia, unspecified (R13.10) Plan: Other (Comment)(no f/u for swallowing)       GO               Kalecia Hartney L. Tivis Ringer, [place] CCC/SLP Acute Rehabilitation Services Office number [phone] Pager [phone]  [name] 10/24/2018, 1:45  PM

## 2018-10-24 NOTE — TOC Initial Note (Signed)
Transition of Care [facility]) - Initial/Assessment Note    Patient Details  Name: Ronald [name] MRN: [id number] Date of Birth: 10-17-1945  [facility] CM/SW Contact:    Pollie Friar, RN Phone Number: 10/24/2018, 2:42 PM  Clinical Narrative:                 Recommendations are for outpatient therapy. Pt and wife want Neurorehab. Orders in Epic and information on the AVS.  Wife concerned about caring for him at home until he is having less headaches and vomiting. MD is aware.  TOC following for further d/c needs.   Expected Discharge Plan: OP Rehab Barriers to Discharge: Continued Medical Work up   Patient Goals and CMS Choice     Choice offered to / list presented to : Spouse  Expected Discharge Plan and Services Expected Discharge Plan: OP Rehab   Discharge Planning Services: CM Consult   Living arrangements for the past 2 months: Single Family Home                                      Prior Living Arrangements/Services Living arrangements for the past 2 months: Single Family Home Lives with:: Spouse Patient language and need for interpreter reviewed:: Yes(no needs) Do you feel safe going back to the place where you live?: Yes      Need for Family Participation in Patient Care: Yes (Comment)(24 hour supervision) Care giver support system in place?: Yes (comment)(wife able to provide some supervision)   Criminal Activity/Legal Involvement Pertinent to Current Situation/Hospitalization: No - Comment as needed  Activities of Daily Living Home Assistive Devices/Equipment: CPAP, Hearing aid ADL Screening (condition at time of admission) Patient's cognitive ability adequate to safely complete daily activities?: Yes Is the patient deaf or have difficulty hearing?: Yes Does the patient have difficulty seeing, even when wearing glasses/contacts?: No Does the patient have difficulty concentrating, remembering, or making decisions?: No Patient able to  express need for assistance with ADLs?: No Does the patient have difficulty dressing or bathing?: No Independently performs ADLs?: Yes (appropriate for developmental age) Does the patient have difficulty walking or climbing stairs?: No Weakness of Legs: Both Weakness of Arms/Hands: Both  Permission Sought/Granted                  Emotional Assessment Appearance:: Appears stated age Attitude/Demeanor/Rapport: Engaged Affect (typically observed): Accepting Orientation: : Oriented to Self, Oriented to Place, Oriented to  Time, Oriented to Situation   Psych Involvement: No (comment)  Admission diagnosis:  Nontraumatic cortical hemorrhage of right cerebral hemisphere [facility]) [I61.1] Patient Active Problem List   Diagnosis Date Noted  . Seizures ([place]), secondsry to [facility] 10/23/2018  . Coagulopathy ([place]), Eliquis 10/23/2018  . Cerebral edema ([place]) 10/23/2018  . Hypertensive emergency 10/23/2018  . Advanced age 60/16/2020  . ICH (intracerebral hemorrhage) ([place]) w/ SAH while on Eliquis 10/21/2018  . Hepatic steatosis 04/22/2018  . Actinic keratoses 06/07/2017  . BPH (benign prostatic hyperplasia) 05/30/2017  . Cardiomyopathy due to systemic disease ([place]) 03/09/2017  . Preop cardiovascular exam 03/07/2017  . Hypervolemia   . Chronic combined systolic and diastolic CHF (congestive heart failure) ([place])   . Hypoxia 02/21/2017  . ARF (acute renal failure) ([place])   . Hypomagnesemia 02/20/2017  . GI bleed 02/20/2017  . Severe sepsis ([place]) 02/19/2017  . History of colonic polyps 07/03/2016  . Chronic anticoagulation 07/03/2016  .  Hyperglycemia, drug-induced 04/05/2015  . 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 ([place]) 02/21/2015  . On amiodarone therapy 12/08/2014  . Edema of both legs 12/08/2014  . Exertional dyspnea 08/18/2014  . Hypokalemia   . Tobacco abuse   . Paroxysmal atrial fibrillation ([place]): CHA2DS2-VASc Score 3 - On Eliquis 05/04/2014  . Cigarette  smoker two packs a day or less   . Prostate cancer ([place]) 10/15/2013  . Obesity (BMI 30-39.9) 04/17/2013  . Umbilical hernia 82/51/8984  . Right inguinal hernia 06/23/2012  . Hydrocele 06/19/2012  . Metabolic syndrome 21/06/1279  . Type 2 diabetes mellitus with hyperglycemia ([place]) 03/20/2012  . Elevated PSA 03/20/2012  . GOUT, UNSPECIFIED 10/07/2009  . TINEA VERSICOLOR 07/19/2009  . PERS HX TOBACCO USE PRESENTING HAZARDS HEALTH 07/19/2009  . Obstructive sleep apnea 07/02/2008  . Hyperlipidemia with target LDL less than 70 07/01/2008  . Essential hypertension 07/01/2008  . ALLERGIC RHINITIS 07/01/2008   PCP:  [name], MD Pharmacy:   [id number] [place], [place] [address] Phone: [phone] Fax: [phone]  [facility] [place] [address] [place] [address] Phone: [phone] Fax: [phone]     Social Determinants of Health (SDOH) Interventions    Readmission Risk Interventions No flowsheet data found.

## 2018-10-24 NOTE — Plan of Care (Signed)
  Problem: Coping: Goal: Will verbalize positive feelings about self Outcome: Progressing

## 2018-10-24 NOTE — Plan of Care (Signed)
In progress toward  goals. 

## 2018-10-24 NOTE — Progress Notes (Signed)
STROKE TEAM PROGRESS NOTE   INTERVAL HISTORY PT at bedside. He just finished with PT. patient sitting in chair, comfortably, no complaints, slight headache this morning.  CT repeat stable hematoma and edema with mild mass-effect.  Patient had vomited twice yesterday.  Currently no complaining of nauseous.  He ate well this morning.  Vitals:   10/24/18 0900 10/24/18 0952 10/24/18 0957 10/24/18 1318  BP: (!) 148/82 (!) 152/107  136/77  Pulse:  64 67 (!) 57  Resp:  16  18  Temp:  99.2 F (37.3 C)  98.6 F (37 C)  TempSrc:  Oral  Oral  SpO2:  (!) 89% 94% 93%  Weight:      Height:        CBC:  Recent Labs  Lab 10/21/18 1709  10/23/18 0357 10/24/18 0518  WBC 10.2   < > 8.3 7.6  NEUTROABS 7.4  --   --   --   HGB 16.5   < > 13.7 13.3  HCT 49.7   < > 41.3 39.5  MCV 102.9*   < > 103.0* 101.3*  PLT 229   < > 169 169   < > = values in this interval not displayed.    Basic Metabolic Panel:  Recent Labs  Lab 10/23/18 0357 10/24/18 0518  NA 142 138  K 3.7 3.3*  CL 108 104  CO2 24 24  GLUCOSE 151* 168*  BUN 13 12  CREATININE 1.43* 1.25*  CALCIUM 8.3* 8.1*   Lipid Panel:     Component Value Date/Time   CHOL 173 10/21/2018 1828   TRIG 306 (H) 10/21/2018 1828   HDL 35 (L) 10/21/2018 1828   CHOLHDL 4.9 10/21/2018 1828   VLDL 61 (H) 10/21/2018 1828   LDLCALC 77 10/21/2018 1828   HgbA1c:  Lab Results  Component Value Date   HGBA1C 6.9 (H) 10/21/2018   Urine Drug Screen: No results found for: LABOPIA, COCAINSCRNUR, LABBENZ, AMPHETMU, THCU, LABBARB  Alcohol Level No results found for: ETH  IMAGING Ct Head Code Stroke Wo Contrast 10/21/2018 1. 5.6 cm right frontal lobe parenchymal hemorrhage with mild edema and 6 mm of local leftward midline shift. 2. Small volume subarachnoid hemorrhage.   Ct Angio Head W Or Wo Contrast Ct Angio Neck W Or Wo Contrast 10/21/2018 1. Mild intracranial atherosclerosis without large vessel occlusion, flow limiting proximal stenosis, or  aneurysm. 2. Widely patent cervical carotid arteries. 3. Patent vertebral arteries with mild proximal stenosis on the right.   Ct Venogram Head 10/21/2018 Negative CT venogram.   [name] [facility] Contrast 10/22/2018 1. Stable acute hemorrhage within right parafalcine frontal lobe as well as associated edema and local mass effect. No abnormal enhancement to suggest underlying mass or vascular malformation. 2. Mild chronic microvascular ischemic changes and volume loss of the brain.  2D Echocardiogram  10/22/2018 1. The left ventricle has hyperdynamic systolic function, with an ejection fraction of >65%. The cavity size was normal. There is moderate concentric left ventricular hypertrophy. Left ventricular diastolic Doppler parameters are consistent with  impaired relaxation.  2. The right ventricle has normal systolic function. The cavity was normal. There is no increase in right ventricular wall thickness.  3. Right atrial size was mildly dilated.  4. No evidence of mitral valve stenosis.  5. No stenosis of the aortic valve.  6. There is mild dilatation of the ascending aorta.  7. The interatrial septum was not assessed.  EEG 10/22/2018 Normal electroencephalogram, awake and drowsy. There are  no focal lateralizing or epileptiform features.   PHYSICAL EXAM  Temp:  [98.1 F (36.7 C)-99.2 F (37.3 C)] 98.6 F (37 C) (07/17 1318) Pulse Rate:  [57-73] 57 (07/17 1318) Resp:  [16-19] 18 (07/17 1318) BP: (126-177)/(77-107) 136/77 (07/17 1318) SpO2:  [89 %-95 %] 93 % (07/17 1318)  General - Well nourished, well developed, in no apparent distress.  Ophthalmologic - fundi not visualized due to noncooperation.  Cardiovascular - Regular rate and rhythm.  Mental Status -  Level of arousal and orientation to time, place, and person were intact. Language including expression, naming, repetition, comprehension was assessed and found intact. Mild psychomotor slowing Fund of Knowledge was  assessed and was intact.  Cranial Nerves II - XII - II - Visual field intact OU. III, IV, VI - Extraocular movements intact. V - Facial sensation intact bilaterally. VII - Facial movement intact bilaterally. VIII - Hearing & vestibular intact bilaterally. X - Palate elevates symmetrically. XI - Chin turning & shoulder shrug intact bilaterally. XII - Tongue protrusion intact.  Motor Strength - The patient's strength was normal in all extremities and pronator drift was absent.  Bulk was normal and fasciculations were absent.   Motor Tone - Muscle tone was assessed at the neck and appendages and was normal.  Reflexes - The patient's reflexes were symmetrical in all extremities and he had no pathological reflexes.  Sensory - Light touch, temperature/pinprick were assessed and were symmetrical.    Coordination - The patient had normal movements in the hands and feet with no ataxia or dysmetria.  Tremor was absent.  Gait and Station - deferred.   ASSESSMENT/PLAN [name] is a 73 y.o. male with history of persistent atrial fibrillation on Eliquis, COPD with emphysema, hypertension, obstructive sleep apnea, hyperlipidemia, prostatic hyperplasia, colonic polyps, tobacco abuse, chronic congestive systolic and diastolic heart failure presenting with new onset general tonic-clonic seizure x1 minute.  Reported headache for 1-2 days  ICH: R frontal ICH with small SAH s/p Kcentra reversal in setting of Eliquis associated coagulopathy   Code Stroke CT head 5.6 cm R frontal lobe IPH with mild edema and 6 mm left shift.  Small volume SAH.  CTA head & neck no LVO.  Mild intracranial atherosclerosis.  Mild proximal stenosis R VA.  CTV negative  MRI  R parafalcine frontal ICH with edema and local mass-effect. No CAA  CT repeat 10/23/18 stable hematoma, and edema with mild mass-effect  2D Echo EF > 65%  EEG normal  LDL 77  HgbA1c 6.9  SCDs for VTE prophylaxis  Eliquis (apixaban)  daily prior to admission, now on No antithrombotic given ICH.   Therapy recommendations:  OP PT/OT/SLP  Disposition: return home  Seizure   No history of seizures  Loaded with Keppra  EEG normal  Continue keppra for now  Atrial Fibrillation  Home anticoagulation:  Eliquis (apixaban) daily   Eliquis on hold given ICH  On amiodarone  Cerebral edema, mild  Mild cerebral edema and MLS on CT and MRI  Does not feel he needs 3% saline  Off 3% now  NA 136-> 145->142->138  Sodium level daily  Hypertensive emergency  Home meds: Coreg 12.5 twice daily, losartan 25  Blood pressure as high as 174/101   SBP goal < 160   off Cleviprex drip now  Resumed home BP meds  Stable . Long-term BP goal normotensive  Hyperlipidemia  Home meds: Lipitor 10  LDL 77  Statin on hold given hemorrhage  Consider SATURN trial  Diabetes type II Controlled  Home meds: Metformin 500  HgbA1c 6.9, at goal < 7.0  Mild hyperglycemia  Home metformin resumed  CBGs  SSI  PCP follow up  Tobacco abuse  Current smoker  Smoking cessation counseling provided  Pt is willing to quit   Other Stroke Risk Factors  Advanced age  ETOH use, advised to drink no more than 2 drink(s) a day  Obesity, Body mass index is 32.07 kg/m., recommend weight loss, diet and exercise as appropriate   Family hx stroke   Obstructive sleep apnea, on CPAP at home  Cardiomyopathy due to systemic disease  Chronic systolic and diastolic congestive heart failure on prn lasix PTA  Other Active Problems  COPD with emphysema  Prostate cancer  CKD stage II, Cre 1.40->1.40->1.43-> 1.25  Hospital day # 3  I had long discussion with wife over the phone, updated pt current condition, treatment plan and potential prognosis. She expressed understanding and appreciation. She concerned pt mild headache today and vomiting yesterday.  She is in agreement that we keep him in the hospital for today  and reassess in the morning.   Rosalin Hawking, MD PhD Stroke Neurology 10/24/2018 2:36 PM   To contact Stroke Continuity provider, please refer to [email or web address] After hours, contact General Neurology

## 2018-10-24 NOTE — Progress Notes (Signed)
Physical Therapy Treatment Patient Details Name: Ronald [name] MRN: [id number] DOB: May 06, 1945 Today's Date: 10/24/2018    History of Present Illness 73 y.o. male with PMHx including a-fib, COPD with emphysema, HTN, chronic congestive systolic and diastolic heart failure, DM who presented to the emergency room via EMS as a code stroke for witnessed seizure activity for about 1 minute. CT-scan of the brain-5.6 cm revealed right frontal lobe parenchymal hemorrhage with mild edema and 6 mm local leftward midline shift.      PT Comments    Patient seen for mobility progression. Pt requires min guard/min A for gait and stair training this session without use of AD. No nausea or vomiting. Continue to progress as tolerated.   Follow Up Recommendations  Outpatient PT     Equipment Recommendations  None recommended by PT    Recommendations for Other Services       Precautions / Restrictions Precautions Precautions: Fall Precaution Comments: monitor O2 Restrictions Weight Bearing Restrictions: No    Mobility  Bed Mobility Overal bed mobility: Needs Assistance Bed Mobility: Supine to Sit     Supine to sit: Min assist     General bed mobility comments: assist to elevate trunk into sitting   Transfers Overall transfer level: Needs assistance Equipment used: None Transfers: Sit to/from Stand Sit to Stand: Supervision         General transfer comment: for safety   Ambulation/Gait Ambulation/Gait assistance: Min guard;Min assist Gait Distance (Feet): 200 Feet Assistive device: None Gait Pattern/deviations: Step-through pattern;Decreased stride length;Staggering left;Staggering right Gait velocity: decreased   General Gait Details: pt with increased need for assistance with horizontal head turns and directional changes; LOB X 2    Stairs Stairs: Yes Stairs assistance: Min guard;Min assist Stair Management: One rail Right;Step to pattern;Forwards Number of Stairs:  10 General stair comments: min guard to ascend and min A to descend   Wheelchair Mobility    Modified Rankin (Stroke Patients Only) Modified Rankin (Stroke Patients Only) Pre-Morbid Rankin Score: No symptoms Modified Rankin: Moderately severe disability     Balance Overall balance assessment: Mild deficits observed, not formally tested                                          Cognition Arousal/Alertness: Awake/alert Behavior During Therapy: WFL for tasks assessed/performed Overall Cognitive Status: Impaired/Different from baseline Area of Impairment: Attention;Memory;Following commands;Safety/judgement                   Current Attention Level: Selective Memory: Decreased short-term memory Following Commands: Follows one step commands with increased time Safety/Judgement: Decreased awareness of safety     General Comments: pt is easily distracted; drowsy      Exercises      General Comments        Pertinent Vitals/Pain Pain Assessment: Faces Faces Pain Scale: Hurts a little bit Pain Location: "all over" Pain Intervention(s): Monitored during session    Home Living                      Prior Function            PT Goals (current goals can now be found in the care plan section) Progress towards PT goals: Progressing toward goals    Frequency    Min 4X/week      PT Plan Current plan remains appropriate  Co-evaluation              AM-PAC PT "6 Clicks" Mobility   Outcome Measure  Help needed turning from your back to your side while in a flat bed without using bedrails?: None Help needed moving from lying on your back to sitting on the side of a flat bed without using bedrails?: None Help needed moving to and from a bed to a chair (including a wheelchair)?: A Little Help needed standing up from a chair using your arms (e.g., wheelchair or bedside chair)?: A Little Help needed to walk in hospital room?: A  Little Help needed climbing 3-5 steps with a railing? : A Little 6 Click Score: 20    End of Session Equipment Utilized During Treatment: Gait belt Activity Tolerance: Patient tolerated treatment well Patient left: in chair;with call bell/phone within reach;Other (comment)(OT present end of session) Nurse Communication: Mobility status PT Visit Diagnosis: Unsteadiness on feet (R26.81)     Time: 3235-5732 PT Time Calculation (min) (ACUTE ONLY): 24 min  Charges:  $Gait Training: 23-37 mins                     Earney Navy, PTA Acute Rehabilitation Services Pager: [phone] Office: [phone]     Darliss Cheney 10/24/2018, 1:19 PM

## 2018-10-24 NOTE — Progress Notes (Signed)
Occupational Therapy Treatment Patient Details Name: Ronald [name] MRN: [id number] DOB: 04/15/1945 Today's Date: 10/24/2018    History of present illness 73 y.o. male with PMHx including a-fib, COPD with emphysema, HTN, chronic congestive systolic and diastolic heart failure, DM who presented to the emergency room via EMS as a code stroke for witnessed seizure activity for about 1 minute. CT-scan of the brain-5.6 cm revealed right frontal lobe parenchymal hemorrhage with mild edema and 6 mm local leftward midline shift.     OT comments  Pt progressing well. No nausea reported throughout session. Pt requiring increased cues to begin session as pt was reading newspaper and not aware that OT wanted to start session. Pt performing mobility with supervisionA to minguardA with no AD. Pt performing grooming at sink after set-upA preferring seated for tasks. Pt fatigues easily. Pt following all commands with increased time single step and multi step commands. Pt completing Short Blessed Test for cognition and attention. Pt scoring 4/28- normal cognitive range (no dementia yet) and had  difficulty only with recall after 5 mins. Immediate recall intact. Pt would benefit from continued OT for cognitive strategies. OT following acutely.   Follow Up Recommendations  Outpatient OT;Supervision/Assistance - 24 hour(for higher level cognition)    Equipment Recommendations  None recommended by OT    Recommendations for Other Services      Precautions / Restrictions Precautions Precautions: Fall Precaution Comments: monitor O2 Restrictions Weight Bearing Restrictions: No       Mobility Bed Mobility Overal bed mobility: Needs Assistance Bed Mobility: Supine to Sit     Supine to sit: Min assist     General bed mobility comments: up in chair upon arrival  Transfers Overall transfer level: Needs assistance Equipment used: None Transfers: Sit to/from Stand Sit to Stand: Supervision          General transfer comment: for safety     Balance Overall balance assessment: Mild deficits observed, not formally tested                             High Level Balance Comments: supervision           ADL either performed or assessed with clinical judgement   ADL Overall ADL's : Needs assistance/impaired                                     Functional mobility during ADLs: Supervision/safety General ADL Comments: Pt performing grooming in standing at sink with set-upA. Pt requiring cues to stop reading newspaper to begin session     Vision   Vision Assessment?: No apparent visual deficits   Perception     Praxis      Cognition Arousal/Alertness: Awake/alert Behavior During Therapy: WFL for tasks assessed/performed Overall Cognitive Status: Impaired/Different from baseline Area of Impairment: Safety/judgement;Memory;Awareness                   Current Attention Level: Selective Memory: Decreased short-term memory Following Commands: Follows one step commands with increased time Safety/Judgement: Decreased awareness of safety Awareness: Intellectual   General Comments: Pt completing Short Blessed Test for cognition and attention. Pt scoring 4/28- normal cognitive range (no dementia yet) and had  difficulty only with recall after 5 mins. Immediate reclal intact.         Exercises     Shoulder Instructions  General Comments Pt with no c/o nausea. Pt looking in bag for shaving equipment and unable to retrieive as he said "my bride didn't pack my shaver." Pt was fatigued after looking so after typical grooming, pt returned to recliner to rest.    Pertinent Vitals/ Pain       Pain Assessment: 0-10 Faces Pain Scale: No hurt Pain Location: "all over" Pain Intervention(s): Monitored during session  Home Living                                          Prior Functioning/Environment               Frequency  Min 2X/week        Progress Toward Goals  OT Goals(current goals can now be found in the care plan section)  Progress towards OT goals: Progressing toward goals  Acute Rehab OT Goals Patient Stated Goal: wants to go home OT Goal Formulation: With patient Time For Goal Achievement: 11/05/18 Potential to Achieve Goals: Good ADL Goals Pt Will Perform Grooming: with modified independence;standing Pt Will Perform Lower Body Bathing: with modified independence;sit to/from stand Pt Will Perform Lower Body Dressing: with modified independence;sit to/from stand Pt Will Transfer to Toilet: with modified independence;ambulating Pt Will Perform Toileting - Clothing Manipulation and hygiene: with modified independence;sit to/from stand Additional ADL Goal #1: Pt will participate in cognitive assessment.  Plan Discharge plan remains appropriate    Co-evaluation                 AM-PAC OT "6 Clicks" Daily Activity     Outcome Measure   Help from another person eating meals?: None Help from another person taking care of personal grooming?: A Little Help from another person toileting, which includes using toliet, bedpan, or urinal?: A Little Help from another person bathing (including washing, rinsing, drying)?: A Little Help from another person to put on and taking off regular upper body clothing?: None Help from another person to put on and taking off regular lower body clothing?: A Little 6 Click Score: 20    End of Session Equipment Utilized During Treatment: Gait belt  OT Visit Diagnosis: Unsteadiness on feet (R26.81);Muscle weakness (generalized) (M62.81);Other symptoms and signs involving cognitive function   Activity Tolerance Patient limited by pain   Patient Left in chair;with call bell/phone within reach;with chair alarm set   Nurse Communication Mobility status        Time: 0037-0488 OT Time Calculation (min): 18 min  Charges: OT General  Charges $OT Visit: 1 Visit OT Treatments $Self Care/Home Management : 8-22 mins  Darryl Nestle) Marsa Aris OTR/L Acute Rehabilitation Services Pager: [phone] Office: [phone]    Audie Pinto 10/24/2018, 3:51 PM

## 2018-10-25 LAB — CBC
HCT: 42.7 % (ref 39.0–52.0)
Hemoglobin: 14.4 g/dL (ref 13.0–17.0)
MCH: 33.9 pg (ref 26.0–34.0)
MCHC: 33.7 g/dL (ref 30.0–36.0)
MCV: 100.5 fL — ABNORMAL HIGH (ref 80.0–100.0)
Platelets: 198 10*3/uL (ref 150–400)
RBC: 4.25 MIL/uL (ref 4.22–5.81)
RDW: 12.4 % (ref 11.5–15.5)
WBC: 7 10*3/uL (ref 4.0–10.5)
nRBC: 0 % (ref 0.0–0.2)

## 2018-10-25 LAB — BASIC METABOLIC PANEL
Anion gap: 11 (ref 5–15)
BUN: 12 mg/dL (ref 8–23)
CO2: 24 mmol/L (ref 22–32)
Calcium: 8.1 mg/dL — ABNORMAL LOW (ref 8.9–10.3)
Chloride: 102 mmol/L (ref 98–111)
Creatinine, Ser: 1.2 mg/dL (ref 0.61–1.24)
GFR calc Af Amer: >60 mL/min (ref >60)
GFR calc non Af Amer: 60 mL/min — ABNORMAL LOW (ref >60)
Glucose, Bld: 136 mg/dL — ABNORMAL HIGH (ref 70–99)
Potassium: 3.3 mmol/L — ABNORMAL LOW (ref 3.5–5.1)
Sodium: 137 mmol/L (ref 135–145)

## 2018-10-25 LAB — GLUCOSE, CAPILLARY
Glucose-Capillary: 154 mg/dL — ABNORMAL HIGH (ref 70–99)
Glucose-Capillary: 196 mg/dL — ABNORMAL HIGH (ref 70–99)
Glucose-Capillary: 173 mg/dL — ABNORMAL HIGH (ref 70–99)
Glucose-Capillary: 146 mg/dL — ABNORMAL HIGH (ref 70–99)

## 2018-10-25 MED ORDER — POTASSIUM CHLORIDE CRYS ER 20 MEQ PO TBCR
20.0000 meq | EXTENDED_RELEASE_TABLET | Freq: Three times a day (TID) | ORAL | Status: AC
  Administered 2018-10-25 – 2018-10-26 (×3): 20 meq via ORAL
  Filled 2018-10-25 (×3): qty 1

## 2018-10-25 NOTE — Progress Notes (Signed)
RN called to pt's room due to pt vomiting after he walked in the hall with the therapist. RN gave PRN dose of Zofran, and returned pt to his chair. Will continue to monitor

## 2018-10-25 NOTE — Progress Notes (Signed)
Rehab Admissions Coordinator Note:  Patient was screened by Michel Santee for appropriateness for an Inpatient Acute Rehab Consult.  At this time, pt is performing at too high a level for CIR (min>min guard 250').  Re[facility] or outpatient.   Michel Santee 10/25/2018, 6:01 PM  I can be reached at [phone].

## 2018-10-25 NOTE — Plan of Care (Signed)
  Problem: Self-Care: Goal: Ability to participate in self-care as condition permits will improve Outcome: Progressing   

## 2018-10-25 NOTE — Progress Notes (Addendum)
Physical Therapy Treatment Patient Details Name: Ronald [name] MRN: [id number] DOB: 1945-05-09 Today's Date: 10/25/2018    History of Present Illness 73 y.o. male with PMHx including a-fib, COPD with emphysema, HTN, chronic congestive systolic and diastolic heart failure, DM who presented to the emergency room via EMS as a code stroke for witnessed seizure activity for about 1 minute. CT-scan of the brain-5.6 cm revealed right frontal lobe parenchymal hemorrhage with mild edema and 6 mm local leftward midline shift.      PT Comments    Patient seen for mobility progression. Very distracted by cell phone requiring cues to begin session. Patient unsteady with gait with patient using handrail in hallway. Patient walking directly into large yellow wet floor sign - required Min A for balance with cueing for obstacle navigation - when asked if he saw sign he replied with "kinda." Patient reporting general fatigue throughout session with patient vomiting following mobility - VSS and nursing aware. PT updating d/c recs to CIR level therapies to progress safe and independent functional mobility prior to return home, as patient requires increased assist for safe mobility. PT to continue to follow acutely.     Of note, patient believes nurse tech, Elmyra Ricks, is his niece and that her real name is Mac. Discussed with nursing staff.    Follow Up Recommendations  CIR;Supervision/Assistance - 24 hour     Equipment Recommendations  None recommended by PT    Recommendations for Other Services Rehab consult     Precautions / Restrictions Precautions Precautions: Fall Restrictions Weight Bearing Restrictions: No    Mobility  Bed Mobility Overal bed mobility: Needs Assistance             General bed mobility comments: up in chair upon arrival  Transfers Overall transfer level: Needs assistance Equipment used: None Transfers: Sit to/from Stand Sit to Stand: Supervision;Min guard          General transfer comment: for safety and immediate standing balance  Ambulation/Gait Ambulation/Gait assistance: Min assist Gait Distance (Feet): 250 Feet Assistive device: None Gait Pattern/deviations: Step-through pattern;Decreased stride length;Staggering left;Staggering right;Trunk flexed Gait velocity: decreased   General Gait Details: unsteady gait pattern - often reaches for handrail in hallway; walked directly into large yellow wet floor sign - when asked if he saw sign he said "kinda"   Stairs             Wheelchair Mobility    Modified Rankin (Stroke Patients Only) Modified Rankin (Stroke Patients Only) Pre-Morbid Rankin Score: No symptoms Modified Rankin: Moderately severe disability     Balance Overall balance assessment: Mild deficits observed, not formally tested                             High Level Balance Comments: up to Min A for balance            Cognition Arousal/Alertness: Awake/alert Behavior During Therapy: WFL for tasks assessed/performed Overall Cognitive Status: Impaired/Different from baseline Area of Impairment: Safety/judgement;Memory;Awareness;Following commands                     Memory: Decreased short-term memory Following Commands: Follows one step commands with increased time;Follows multi-step commands inconsistently Safety/Judgement: Decreased awareness of safety;Decreased awareness of deficits Awareness: Intellectual   General Comments: patient preoccupied with cell phone - requires cueing to begin session; easily distractable      Exercises      General Comments General comments (  skin integrity, edema, etc.): vomited following mobility- nursing aware; patient called wife in session and stated "I'm going home after I pass this test"      Pertinent Vitals/Pain Pain Assessment: Faces Faces Pain Scale: Hurts a little bit Pain Location: head Pain Descriptors / Indicators: Headache Pain  Intervention(s): Limited activity within patient's tolerance;Monitored during session;Repositioned    Home Living                      Prior Function            PT Goals (current goals can now be found in the care plan section) Acute Rehab PT Goals Patient Stated Goal: wants to go home PT Goal Formulation: With patient Time For Goal Achievement: 11/05/18 Potential to Achieve Goals: Good Progress towards PT goals: Progressing toward goals    Frequency    Min 4X/week      PT Plan Discharge plan needs to be updated    Co-evaluation              AM-PAC PT "6 Clicks" Mobility   Outcome Measure  Help needed turning from your back to your side while in a flat bed without using bedrails?: None Help needed moving from lying on your back to sitting on the side of a flat bed without using bedrails?: None Help needed moving to and from a bed to a chair (including a wheelchair)?: A Little Help needed standing up from a chair using your arms (e.g., wheelchair or bedside chair)?: A Little Help needed to walk in hospital room?: A Little Help needed climbing 3-5 steps with a railing? : A Little 6 Click Score: 20    End of Session Equipment Utilized During Treatment: Gait belt Activity Tolerance: Patient tolerated treatment well;Treatment limited secondary to medical complications (Comment)(N&V) Patient left: in chair;with call bell/phone within reach;with chair alarm set;with nursing/sitter in room Nurse Communication: Mobility status PT Visit Diagnosis: Unsteadiness on feet (R26.81)     Time: 1410-3013 PT Time Calculation (min) (ACUTE ONLY): 19 min  Charges:  $Gait Training: 8-22 mins                      Lanney Gins, PT, DPT Supplemental Physical Therapist 10/25/18 12:41 PM Pager: [phone] Office: [phone]

## 2018-10-25 NOTE — Progress Notes (Signed)
Pt got up to take a bath and walked to the sink. Pt reported that after his bath and walking to his chair he felt nauseas; But did not need medicine. Will continue to monitor.

## 2018-10-25 NOTE — Progress Notes (Signed)
STROKE TEAM PROGRESS NOTE   INTERVAL HISTORY Patient states he is doing well.  He wants to go home.  I spoke to his wife over the phone who feels is not back to his baseline.  Patient was still complaining of intermittent headaches.  He and his wife considering possible participation in the [place] trial but have not yet made up their mind.  They want to discuss this first with his primary care physician Dr. Elease Hashimoto on Monday Vitals:   10/25/18 0431 10/25/18 0808 10/25/18 0847 10/25/18 1155  BP: (!) 155/101  132/90 (!) 138/99  Pulse: 70 (!) 110  76  Resp: 18 16 12 16   Temp: 98.5 F (36.9 C)  97.8 F (36.6 C) 99.3 F (37.4 C)  TempSrc: Oral  Oral Oral  SpO2: 92% 94% 92% 98%  Weight:      Height:        CBC:  Recent Labs  Lab 10/21/18 1709  10/24/18 0518 10/25/18 0331  WBC 10.2   < > 7.6 7.0  NEUTROABS 7.4  --   --   --   HGB 16.5   < > 13.3 14.4  HCT 49.7   < > 39.5 42.7  MCV 102.9*   < > 101.3* 100.5*  PLT 229   < > 169 198   < > = values in this interval not displayed.    Basic Metabolic Panel:  Recent Labs  Lab 10/24/18 0518 10/25/18 0331  NA 138 137  K 3.3* 3.3*  CL 104 102  CO2 24 24  GLUCOSE 168* 136*  BUN 12 12  CREATININE 1.25* 1.20  CALCIUM 8.1* 8.1*   Lipid Panel:     Component Value Date/Time   CHOL 173 10/21/2018 1828   TRIG 306 (H) 10/21/2018 1828   HDL 35 (L) 10/21/2018 1828   CHOLHDL 4.9 10/21/2018 1828   VLDL 61 (H) 10/21/2018 1828   LDLCALC 77 10/21/2018 1828   HgbA1c:  Lab Results  Component Value Date   HGBA1C 6.9 (H) 10/21/2018   Urine Drug Screen: No results found for: LABOPIA, COCAINSCRNUR, LABBENZ, AMPHETMU, THCU, LABBARB  Alcohol Level No results found for: ETH  IMAGING  Ct Head Code Stroke Wo Contrast 10/21/2018 1. 5.6 cm right frontal lobe parenchymal hemorrhage with mild edema and 6 mm of local leftward midline shift. 2. Small volume subarachnoid hemorrhage.   Ct Angio Head W Or Wo Contrast Ct Angio Neck W Or Wo  Contrast 10/21/2018 1. Mild intracranial atherosclerosis without large vessel occlusion, flow limiting proximal stenosis, or aneurysm. 2. Widely patent cervical carotid arteries. 3. Patent vertebral arteries with mild proximal stenosis on the right.   Ct Venogram Head 10/21/2018 Negative CT venogram.   [name] Contrast 10/22/2018 1. Stable acute hemorrhage within right parafalcine frontal lobe as well as associated edema and local mass effect. No abnormal enhancement to suggest underlying mass or vascular malformation. 2. Mild chronic microvascular ischemic changes and volume loss of the brain.  2D Echocardiogram  10/22/2018 1. The left ventricle has hyperdynamic systolic function, with an ejection fraction of >65%. The cavity size was normal. There is moderate concentric left ventricular hypertrophy. Left ventricular diastolic Doppler parameters are consistent with  impaired relaxation.  2. The right ventricle has normal systolic function. The cavity was normal. There is no increase in right ventricular wall thickness.  3. Right atrial size was mildly dilated.  4. No evidence of mitral valve stenosis.  5. No stenosis of the aortic valve.  6. There is mild dilatation of the ascending aorta.  7. The interatrial septum was not assessed.  EEG 10/22/2018 Normal electroencephalogram, awake and drowsy. There are no focal lateralizing or epileptiform features.   PHYSICAL EXAM  Temp:  [97.8 F (36.6 C)-99.3 F (37.4 C)] 99.3 F (37.4 C) (07/18 1155) Pulse Rate:  [49-110] 76 (07/18 1155) Resp:  [12-18] 16 (07/18 1155) BP: (131-155)/(77-105) 138/99 (07/18 1155) SpO2:  [91 %-98 %] 98 % (07/18 1155)  General - Well nourished, well developed, in no apparent distress.  Ophthalmologic - fundi not visualized due to noncooperation.  Cardiovascular - Regular rate and rhythm.  Mental Status -  Awake alert oriented to place and person.  Good recall of recent events.  Able to name the  president.  Follows simple midline 1 and two-step commands.  Diminished recall 2/3.  Fund of Knowledge was assessed and was intact.  Cranial Nerves II - XII - II - Visual field intact OU. III, IV, VI - Extraocular movements intact. V - Facial sensation intact bilaterally. VII - Facial movement intact bilaterally. VIII - Hearing & vestibular intact bilaterally. X - Palate elevates symmetrically. XI - Chin turning & shoulder shrug intact bilaterally. XII - Tongue protrusion intact.  Motor Strength - The patient's strength was normal in all extremities and pronator drift was absent.  Bulk was normal and fasciculations were absent.   Motor Tone - Muscle tone was assessed at the neck and appendages and was normal.  Reflexes - The patient's reflexes were symmetrical in all extremities and he had no pathological reflexes.  Sensory - Light touch, temperature/pinprick were assessed and were symmetrical.    Coordination - The patient had normal movements in the hands and feet with no ataxia or dysmetria.  Tremor was absent.  Gait and Station - deferred.   ASSESSMENT/PLAN [name] is a 73 y.o. male with history of persistent atrial fibrillation on Eliquis, COPD with emphysema, hypertension, obstructive sleep apnea, hyperlipidemia, prostatic hyperplasia, colonic polyps, tobacco abuse, chronic congestive systolic and diastolic heart failure presenting with new onset general tonic-clonic seizure x1 minute.  Reported headache for 1-2 days  ICH: R frontal ICH with small SAH s/p Kcentra reversal in setting of Eliquis associated coagulopathy   Code Stroke CT head 5.6 cm R frontal lobe IPH with mild edema and 6 mm left shift.  Small volume SAH.  CTA head & neck no LVO.  Mild intracranial atherosclerosis.  Mild proximal stenosis R VA.  CTV negative  MRI  R parafalcine frontal ICH with edema and local mass-effect. No CAA  CT repeat 10/23/18 stable hematoma, and edema with mild  mass-effect  2D Echo EF > 65%  EEG normal  LDL 77  HgbA1c 6.9  SCDs for VTE prophylaxis  Eliquis (apixaban) daily prior to admission, now on No antithrombotic given ICH.   Therapy recommendations:  OP PT/OT/SLP  Disposition: return home  Seizure   No history of seizures  Loaded with Keppra  EEG normal  Continue keppra for now  Atrial Fibrillation  Home anticoagulation:  Eliquis (apixaban) daily   Eliquis on hold given ICH   On amiodarone  Cerebral edema, mild  Mild cerebral edema and MLS on CT and MRI  Does not feel he needs 3% saline  Off 3% now  NA 136-> 145->142->138->137  Sodium level daily  Hypertensive emergency  Home meds: Coreg 12.5 twice daily, losartan 25  Blood pressure as high as 174/101   SBP goal < 160  off Cleviprex drip now  Resumed home BP meds  Stable . Long-term BP goal normotensive  Hyperlipidemia  Home meds: Lipitor 10  LDL 77  Statin on hold given hemorrhage   Consider [place] trial  Diabetes type II Controlled  Home meds: Metformin 500  HgbA1c 6.9, at goal < 7.0  Mild hyperglycemia  Home metformin resumed  CBGs  SSI  PCP follow up  Tobacco abuse  Current smoker  Smoking cessation counseling provided  Pt is willing to quit   Other Stroke Risk Factors  Advanced age  ETOH use, advised to drink no more than 2 drink(s) a day  Obesity, Body mass index is 32.07 kg/m., recommend weight loss, diet and exercise as appropriate   Family hx stroke   Obstructive sleep apnea, on CPAP at home  Cardiomyopathy due to systemic disease  Chronic systolic and diastolic congestive heart failure on prn lasix PTA  Other Active Problems  COPD with emphysema  Prostate cancer  CKD stage II, Cre 1.40->1.40->1.43->1.25->1.20   Hypokalemia - 3.3 -> supplement and recheck in AM   PLAN Continue ongoing care and therapies.  Likely discharge home in a few days when close to baseline.  Long discussion  with patient and wife over the phone and answered questions. Hospital day # 4  I have personally obtained history,examined this patient, reviewed notes, independently viewed imaging studies, participated in medical decision making and plan of care.ROS completed by me personally and pertinent positives fully documented  I have made any additions or clarifications directly to the above note.  Greater than 50% time during this 25-minute visit was spent on counseling and coordination of care about his intracerebral hemorrhage and discussion of plan of care with care team and his wife and answering questions  Antony Contras, MD Medical Director [place] Pager: [phone] 10/25/2018 1:23 PM      To contact Stroke Continuity provider, please refer to [email or web address] After hours, contact General Neurology

## 2018-10-26 DIAGNOSIS — I610 Nontraumatic intracerebral hemorrhage in hemisphere, subcortical: Secondary | ICD-10-CM

## 2018-10-26 LAB — GLUCOSE, CAPILLARY
Glucose-Capillary: 149 mg/dL — ABNORMAL HIGH (ref 70–99)
Glucose-Capillary: 164 mg/dL — ABNORMAL HIGH (ref 70–99)
Glucose-Capillary: 118 mg/dL — ABNORMAL HIGH (ref 70–99)
Glucose-Capillary: 170 mg/dL — ABNORMAL HIGH (ref 70–99)

## 2018-10-26 LAB — BASIC METABOLIC PANEL
Anion gap: 9 (ref 5–15)
BUN: 16 mg/dL (ref 8–23)
CO2: 27 mmol/L (ref 22–32)
Calcium: 8.3 mg/dL — ABNORMAL LOW (ref 8.9–10.3)
Chloride: 100 mmol/L (ref 98–111)
Creatinine, Ser: 1.33 mg/dL — ABNORMAL HIGH (ref 0.61–1.24)
GFR calc Af Amer: >60 mL/min (ref >60)
GFR calc non Af Amer: 53 mL/min — ABNORMAL LOW (ref >60)
Glucose, Bld: 173 mg/dL — ABNORMAL HIGH (ref 70–99)
Potassium: 3.4 mmol/L — ABNORMAL LOW (ref 3.5–5.1)
Sodium: 136 mmol/L (ref 135–145)

## 2018-10-26 MED ORDER — POTASSIUM CHLORIDE CRYS ER 20 MEQ PO TBCR
20.0000 meq | EXTENDED_RELEASE_TABLET | Freq: Three times a day (TID) | ORAL | Status: AC
  Administered 2018-10-26 – 2018-10-27 (×3): 20 meq via ORAL
  Filled 2018-10-26 (×3): qty 1

## 2018-10-26 NOTE — Progress Notes (Signed)
Physical Therapy Treatment Patient Details Name: Ronald [name] MRN: [id number] DOB: 07-Aug-1945 Today's Date: 10/26/2018    History of Present Illness 73 y.o. male with PMHx including a-fib, COPD with emphysema, HTN, chronic congestive systolic and diastolic heart failure, DM who presented to the emergency room via EMS as a code stroke for witnessed seizure activity for about 1 minute. CT-scan of the brain-5.6 cm revealed right frontal lobe parenchymal hemorrhage with mild edema and 6 mm local leftward midline shift.      PT Comments    Patient progressing with ambulation safety not running into obstacles in hallway, but still staggers to sides of hallway occasionally and has some issues with safety on steps.  Feel at min A level wife can take him home, though might help to have her go through a therapy session prior to d/c due to significant safety issues.  PT to follow acutely.    Follow Up Recommendations  Supervision/Assistance - 24 hour;Outpatient PT(neurorehabilitation)     Equipment Recommendations  None recommended by PT    Recommendations for Other Services       Precautions / Restrictions Precautions Precautions: Fall    Mobility  Bed Mobility Overal bed mobility: Needs Assistance Bed Mobility: Supine to Sit;Sit to Supine     Supine to sit: Min assist Sit to supine: Supervision   General bed mobility comments: patient initially pulling up on PT, then able to push up from bed with assist for balance  Transfers Overall transfer level: Needs assistance Equipment used: None Transfers: Sit to/from Stand Sit to Stand: Min guard         General transfer comment: to steady  Ambulation/Gait Ambulation/Gait assistance: Min guard Gait Distance (Feet): 150 Feet(x 2) Assistive device: None Gait Pattern/deviations: Step-through pattern;Decreased stride length;Staggering right;Staggering left;Wide base of support     General Gait Details: negotiated around obstacles  appropriately, sat to rest in hallway, HR max 120 wtih stair negotiation   Stairs Stairs: Yes Stairs assistance: Min guard;Min assist Stair Management: One rail Right;Alternating pattern;Step to pattern;Forwards Number of Stairs: 10 General stair comments: step to sequence to ascend, step through to descend   Wheelchair Mobility    Modified Rankin (Stroke Patients Only) Modified Rankin (Stroke Patients Only) Pre-Morbid Rankin Score: No symptoms Modified Rankin: Moderately severe disability     Balance Overall balance assessment: Needs assistance   Sitting balance-Leahy Scale: Good       Standing balance-Leahy Scale: Good Standing balance comment: static balance without UE support                            Cognition Arousal/Alertness: Lethargic Behavior During Therapy: Flat affect Overall Cognitive Status: Impaired/Different from baseline Area of Impairment: Safety/judgement;Memory;Awareness;Following commands                   Current Attention Level: Sustained Memory: Decreased short-term memory Following Commands: Follows one step commands with increased time;Follows multi-step commands inconsistently Safety/Judgement: Decreased awareness of safety;Decreased awareness of deficits     General Comments: waking up at 3pm and thinking it was 3am, slow to respond to commands and needing repeated directions      Exercises      General Comments General comments (skin integrity, edema, etc.): fatigues after ambulation/stair negotiation      Pertinent Vitals/Pain Pain Assessment: No/denies pain    Home Living  Prior Function            PT Goals (current goals can now be found in the care plan section) Progress towards PT goals: Progressing toward goals    Frequency    Min 4X/week      PT Plan Discharge plan needs to be updated    Co-evaluation              AM-PAC PT "6 Clicks" Mobility    Outcome Measure  Help needed turning from your back to your side while in a flat bed without using bedrails?: None Help needed moving from lying on your back to sitting on the side of a flat bed without using bedrails?: A Little Help needed moving to and from a bed to a chair (including a wheelchair)?: A Little Help needed standing up from a chair using your arms (e.g., wheelchair or bedside chair)?: A Little Help needed to walk in hospital room?: A Little Help needed climbing 3-5 steps with a railing? : A Little 6 Click Score: 19    End of Session Equipment Utilized During Treatment: Gait belt Activity Tolerance: Patient tolerated treatment well Patient left: with call bell/phone within reach;in bed;with bed alarm set   PT Visit Diagnosis: Other abnormalities of gait and mobility (R26.89);Other symptoms and signs involving the nervous system (D82.641)     Time: 5830-9407 PT Time Calculation (min) (ACUTE ONLY): 27 min  Charges:  $Gait Training: 23-37 mins                     Magda Kiel, [place] [phone] 10/26/2018    Reginia Naas 10/26/2018, 5:33 PM

## 2018-10-26 NOTE — Progress Notes (Signed)
STROKE TEAM PROGRESS NOTE   INTERVAL HISTORY Patient states he is doing well.    Patient was still complaining of intermittent headaches.   No new complaints today.CLR thinks he is too high functioning Vitals:   10/25/18 2101 10/25/18 2344 10/26/18 0401 10/26/18 0900  BP:  (!) 147/99 (!) 155/96 120/88  Pulse:  75 83 80  Resp:  18 16   Temp:  98.2 F (36.8 C) 98.9 F (37.2 C) 98.7 F (37.1 C)  TempSrc:  Oral Oral Oral  SpO2: 93%  95% 98%  Weight:      Height:        CBC:  Recent Labs  Lab 10/21/18 1709  10/24/18 0518 10/25/18 0331  WBC 10.2   < > 7.6 7.0  NEUTROABS 7.4  --   --   --   HGB 16.5   < > 13.3 14.4  HCT 49.7   < > 39.5 42.7  MCV 102.9*   < > 101.3* 100.5*  PLT 229   < > 169 198   < > = values in this interval not displayed.    Basic Metabolic Panel:  Recent Labs  Lab 10/25/18 0331 10/26/18 0605  NA 137 136  K 3.3* 3.4*  CL 102 100  CO2 24 27  GLUCOSE 136* 173*  BUN 12 16  CREATININE 1.20 1.33*  CALCIUM 8.1* 8.3*   Lipid Panel:     Component Value Date/Time   CHOL 173 10/21/2018 1828   TRIG 306 (H) 10/21/2018 1828   HDL 35 (L) 10/21/2018 1828   CHOLHDL 4.9 10/21/2018 1828   VLDL 61 (H) 10/21/2018 1828   LDLCALC 77 10/21/2018 1828   HgbA1c:  Lab Results  Component Value Date   HGBA1C 6.9 (H) 10/21/2018   Urine Drug Screen: No results found for: LABOPIA, COCAINSCRNUR, LABBENZ, AMPHETMU, THCU, LABBARB  Alcohol Level No results found for: ETH  IMAGING  Ct Head Code Stroke Wo Contrast 10/21/2018 1. 5.6 cm right frontal lobe parenchymal hemorrhage with mild edema and 6 mm of local leftward midline shift. 2. Small volume subarachnoid hemorrhage.   Ct Angio Head W Or Wo Contrast Ct Angio Neck W Or Wo Contrast 10/21/2018 1. Mild intracranial atherosclerosis without large vessel occlusion, flow limiting proximal stenosis, or aneurysm. 2. Widely patent cervical carotid arteries. 3. Patent vertebral arteries with mild proximal stenosis on the  right.   Ct Venogram Head 10/21/2018 Negative CT venogram.   [facility] Contrast 10/22/2018 1. Stable acute hemorrhage within right parafalcine frontal lobe as well as associated edema and local mass effect. No abnormal enhancement to suggest underlying mass or vascular malformation. 2. Mild chronic microvascular ischemic changes and volume loss of the brain.  2D Echocardiogram  10/22/2018 1. The left ventricle has hyperdynamic systolic function, with an ejection fraction of >65%. The cavity size was normal. There is moderate concentric left ventricular hypertrophy. Left ventricular diastolic Doppler parameters are consistent with  impaired relaxation.  2. The right ventricle has normal systolic function. The cavity was normal. There is no increase in right ventricular wall thickness.  3. Right atrial size was mildly dilated.  4. No evidence of mitral valve stenosis.  5. No stenosis of the aortic valve.  6. There is mild dilatation of the ascending aorta.  7. The interatrial septum was not assessed.  EEG 10/22/2018 Normal electroencephalogram, awake and drowsy. There are no focal lateralizing or epileptiform features.   PHYSICAL EXAM  Temp:  [98.2 F (36.8 C)-99 F (  37.2 C)] 98.7 F (37.1 C) (07/19 0900) Pulse Rate:  [75-85] 80 (07/19 0900) Resp:  [15-18] 16 (07/19 0401) BP: (120-155)/(84-99) 120/88 (07/19 0900) SpO2:  [93 %-100 %] 98 % (07/19 0900)  General - Well nourished, well developed elderly caucasian male, in no apparent distress.  Ophthalmologic - fundi not visualized due to noncooperation.  Cardiovascular - Regular rate and rhythm.  Mental Status -  Awake alert oriented to place and person.     Follows simple midline 1 and two-step commands.  Diminished recall 2/3.  Fund of Knowledge was assessed and was intact.  Cranial Nerves II - XII - II - Visual field intact OU. III, IV, VI - Extraocular movements intact. V - Facial sensation intact bilaterally. VII -  Facial movement intact bilaterally. VIII - Hearing & vestibular intact bilaterally. X - Palate elevates symmetrically. XI - Chin turning & shoulder shrug intact bilaterally. XII - Tongue protrusion intact.  Motor Strength - The patient's strength was normal in all extremities and pronator drift was absent.  Bulk was normal and fasciculations were absent.   Motor Tone - Muscle tone was assessed at the neck and appendages and was normal.  Reflexes - The patient's reflexes were symmetrical in all extremities and he had no pathological reflexes.  Sensory - Light touch, temperature/pinprick were assessed and were symmetrical.    Coordination - The patient had normal movements in the hands and feet with no ataxia or dysmetria.  Tremor was absent.  Gait and Station - deferred.   ASSESSMENT/PLAN Mr. BENETT SWOYER is a 73 y.o. male with history of persistent atrial fibrillation on Eliquis, COPD with emphysema, hypertension, obstructive sleep apnea, hyperlipidemia, prostatic hyperplasia, colonic polyps, tobacco abuse, chronic congestive systolic and diastolic heart failure presenting with new onset general tonic-clonic seizure x1 minute.  Reported headache for 1-2 days  ICH: R frontal ICH with small SAH s/p Kcentra reversal in setting of Eliquis associated coagulopathy   Code Stroke CT head 5.6 cm R frontal lobe IPH with mild edema and 6 mm left shift.  Small volume SAH.  CTA head & neck no LVO.  Mild intracranial atherosclerosis.  Mild proximal stenosis R VA.  CTV negative  MRI  R parafalcine frontal ICH with edema and local mass-effect. No CAA  CT repeat 10/23/18 stable hematoma, and edema with mild mass-effect  2D Echo EF > 65%  EEG normal  LDL 77  HgbA1c 6.9  SCDs for VTE prophylaxis  Eliquis (apixaban) daily prior to admission, now on No antithrombotic given ICH.   Therapy recommendations:  OP PT/OT/SLP  Disposition: return home  Seizure   No history of  seizures  Loaded with Keppra  EEG normal  Continue keppra for now  Atrial Fibrillation  Home anticoagulation:  Eliquis (apixaban) daily   Eliquis on hold given ICH   On amiodarone  Cerebral edema, mild  Mild cerebral edema and MLS on CT and MRI  Does not feel he needs 3% saline  Off 3% now  NA 136-> 145->142->138->137->136  Sodium level daily  Hypertensive emergency  Home meds: Coreg 12.5 twice daily, losartan 25  Blood pressure as high as 174/101   SBP goal < 160   off Cleviprex drip now  Resumed home BP meds  Stable . Long-term BP goal normotensive  Hyperlipidemia  Home meds: Lipitor 10  LDL 77  Statin on hold given hemorrhage   Consider [place] trial  Diabetes type II Controlled  Home meds: Metformin 500  HgbA1c 6.9, at  goal < 7.0  Mild hyperglycemia  Home metformin resumed  CBGs  SSI  PCP follow up  Tobacco abuse  Current smoker  Smoking cessation counseling provided  Pt is willing to quit   Other Stroke Risk Factors  Advanced age  ETOH use, advised to drink no more than 2 drink(s) a day  Obesity, Body mass index is 32.07 kg/m., recommend weight loss, diet and exercise as appropriate   Family hx stroke   Obstructive sleep apnea, on CPAP at home  Cardiomyopathy due to systemic disease  Chronic systolic and diastolic congestive heart failure on prn lasix PTA  Other Active Problems  COPD with emphysema  Prostate cancer  CKD stage II, Cre 1.40->1.40->1.43->1.25->1.20->1.33  Hypokalemia - 3.3 -> supplement ->3.4 -> repeat supplement   PLAN Continue ongoing care and therapies.  Likely discharge home tomorrow when close to baseline. CLR thinks he is too high functioning. Hospital day # 5   .  Greater than 50% time during this 15-minute visit was spent on counseling and coordination of care about his intracerebral hemorrhage and discussion of plan of care with care team and his wife and answering  questions  Antony Contras, MD Medical Director [place] Pager: [phone] 10/26/2018 12:48 PM     To contact Stroke Continuity provider, please refer to [email or web address] After hours, contact General Neurology

## 2018-10-27 ENCOUNTER — Other Ambulatory Visit: Payer: Self-pay | Admitting: *Deleted

## 2018-10-27 LAB — GLUCOSE, CAPILLARY
Glucose-Capillary: 159 mg/dL — ABNORMAL HIGH (ref 70–99)
Glucose-Capillary: 150 mg/dL — ABNORMAL HIGH (ref 70–99)
Glucose-Capillary: 120 mg/dL — ABNORMAL HIGH (ref 70–99)

## 2018-10-27 MED ORDER — BUTALBITAL-APAP-CAFFEINE 50-325-40 MG PO TABS: 1.0000 | ORAL_TABLET | Freq: Three times a day (TID) | ORAL | 0 refills | Status: DC | PRN

## 2018-10-27 MED ORDER — ASPIRIN 81 MG PO TBEC: 81.0000 mg | DELAYED_RELEASE_TABLET | Freq: Every day | ORAL | Status: DC

## 2018-10-27 MED ORDER — MONTELUKAST SODIUM 10 MG PO TABS: 10.0000 mg | ORAL_TABLET | Freq: Every evening | ORAL | Status: DC

## 2018-10-27 MED ORDER — OZEMPIC (0.25 OR 0.5 MG/DOSE) 2 MG/1.5ML ~~LOC~~ SOPN: 0.5000 mg | PEN_INJECTOR | SUBCUTANEOUS | Status: DC

## 2018-10-27 MED ORDER — ASPIRIN EC 81 MG PO TBEC
81.0000 mg | DELAYED_RELEASE_TABLET | Freq: Every day | ORAL | Status: DC
  Administered 2018-10-27: 81 mg via ORAL
  Filled 2018-10-27: qty 1

## 2018-10-27 MED ORDER — AMIODARONE HCL 200 MG PO TABS: 200.0000 mg | ORAL_TABLET | Freq: Every day | ORAL | 2 refills | Status: DC

## 2018-10-27 MED ORDER — NICOTINE 14 MG/24HR TD PT24: 14.0000 mg | MEDICATED_PATCH | Freq: Every day | TRANSDERMAL | 0 refills | Status: DC

## 2018-10-27 MED ORDER — LEVETIRACETAM 500 MG PO TABS: 500.0000 mg | ORAL_TABLET | Freq: Two times a day (BID) | ORAL | 2 refills | Status: DC

## 2018-10-27 MED ORDER — AZELASTINE HCL 0.1 % NA SOLN: 1.0000 | Freq: Two times a day (BID) | NASAL | Status: DC | PRN

## 2018-10-27 MED ORDER — COLCHICINE 0.6 MG PO TABS: 0.6000 mg | ORAL_TABLET | ORAL | Status: DC

## 2018-10-27 MED ORDER — NICOTINE 14 MG/24HR TD PT24
14.0000 mg | MEDICATED_PATCH | Freq: Every day | TRANSDERMAL | Status: DC
  Administered 2018-10-27: 14 mg via TRANSDERMAL
  Filled 2018-10-27: qty 1

## 2018-10-27 MED ORDER — FUROSEMIDE 40 MG PO TABS: ORAL_TABLET | ORAL | 1 refills | Status: DC

## 2018-10-27 NOTE — Progress Notes (Signed)
Physical Therapy Treatment Patient Details Name: Ronald [name] MRN: [id number] DOB: 12/06/1945 Today's Date: 10/27/2018    History of Present Illness 73 y.o. male with PMHx including a-fib, COPD with emphysema, HTN, chronic congestive systolic and diastolic heart failure, DM who presented to the emergency room via EMS as a code stroke for witnessed seizure activity for about 1 minute. CT-scan of the brain-5.6 cm revealed right frontal lobe parenchymal hemorrhage with mild edema and 6 mm local leftward midline shift.      PT Comments    Patient seen for continued mobility progression. 3 attempts to see patient requiring increased motivation to participate. Patient with unsteady gait pattern with up to Min A for balance/steadying with gait. VSS throughout session with no onset of N&V today. Will continue to follow.    Follow Up Recommendations  Supervision/Assistance - 24 hour;Outpatient PT     Equipment Recommendations  None recommended by PT    Recommendations for Other Services       Precautions / Restrictions Precautions Precautions: Fall Restrictions Weight Bearing Restrictions: No    Mobility  Bed Mobility               General bed mobility comments: up in recliner  Transfers Overall transfer level: Needs assistance Equipment used: None Transfers: Sit to/from Stand Sit to Stand: Min guard         General transfer comment: to steady  Ambulation/Gait Ambulation/Gait assistance: Min guard;Min assist Gait Distance (Feet): 240 Feet Assistive device: None Gait Pattern/deviations: Step-through pattern;Decreased stride length;Staggering right;Staggering left;Wide base of support Gait velocity: decreased   General Gait Details: unsteady gait pattern; up to Min A for balance; tends to reach for handrail   Stairs             Wheelchair Mobility    Modified Rankin (Stroke Patients Only) Modified Rankin (Stroke Patients Only) Pre-Morbid Rankin Score:  No symptoms Modified Rankin: Moderately severe disability     Balance Overall balance assessment: Needs assistance Sitting-balance support: No upper extremity supported;Feet supported Sitting balance-Leahy Scale: Good     Standing balance support: No upper extremity supported;Single extremity supported;During functional activity Standing balance-Leahy Scale: Fair                              Cognition Arousal/Alertness: Awake/alert Behavior During Therapy: Flat affect Overall Cognitive Status: Impaired/Different from baseline Area of Impairment: Following commands;Safety/judgement;Problem solving                       Following Commands: Follows one step commands with increased time;Follows multi-step commands with increased time Safety/Judgement: Decreased awareness of safety;Decreased awareness of deficits   Problem Solving: Slow processing;Requires verbal cues General Comments: very distracted      Exercises      General Comments        Pertinent Vitals/Pain Pain Assessment: No/denies pain    Home Living                      Prior Function            PT Goals (current goals can now be found in the care plan section) Acute Rehab PT Goals Patient Stated Goal: wants to go home PT Goal Formulation: With patient Time For Goal Achievement: 11/05/18 Potential to Achieve Goals: Good Progress towards PT goals: Progressing toward goals    Frequency    Min 4X/week  PT Plan Current plan remains appropriate    Co-evaluation              AM-PAC PT "6 Clicks" Mobility   Outcome Measure  Help needed turning from your back to your side while in a flat bed without using bedrails?: None Help needed moving from lying on your back to sitting on the side of a flat bed without using bedrails?: A Little Help needed moving to and from a bed to a chair (including a wheelchair)?: A Little Help needed standing up from a chair using  your arms (e.g., wheelchair or bedside chair)?: A Little Help needed to walk in hospital room?: A Little Help needed climbing 3-5 steps with a railing? : A Little 6 Click Score: 19    End of Session Equipment Utilized During Treatment: Gait belt Activity Tolerance: Patient tolerated treatment well Patient left: in chair;with call bell/phone within reach;with chair alarm set Nurse Communication: Mobility status PT Visit Diagnosis: Other abnormalities of gait and mobility (R26.89);Other symptoms and signs involving the nervous system (R29.898)     Time: 5883-2549 PT Time Calculation (min) (ACUTE ONLY): 19 min  Charges:  $Gait Training: 8-22 mins                      Lanney Gins, PT, DPT Supplemental Physical Therapist 10/27/18 1:19 PM Pager: [phone] Office: [phone]

## 2018-10-27 NOTE — Progress Notes (Signed)
Patient being discharged home. Education and information given to patient and patient spouse. IV removed. CCMD notified. All belongings with patient. Pt leaving unit via wheelchair.

## 2018-10-27 NOTE — TOC Transition Note (Signed)
Transition of Care [facility]) - CM/SW Discharge Note   Patient Details  Name: Ronald [name] MRN: [id number] Date of Birth: [birth year]  [facility]) CM/SW Contact:  Geralynn Ochs, LCSW Phone Number: 10/27/2018, 3:21 PM   Clinical Narrative:  CSW spoke with patient's wife, Katharine Look, that patient was being discharged home today. Informed Katharine Look that patient was set up at neurorehab center for outpatient, and that address and contact information was on the patient's discharge paperwork. Katharine Look will come pick up the patient when he's ready.     Final next level of care: OP Rehab Barriers to Discharge: Barriers Resolved   Patient Goals and CMS Choice     Choice offered to / list presented to : Spouse  Discharge Placement                Patient to be transferred to facility by: Family car Name of family member notified: Wife, Katharine Look Patient and family notified of of transfer: 10/27/18  Discharge Plan and Services   Discharge Planning Services: CM Consult                                 Social Determinants of Health (SDOH) Interventions     Readmission Risk Interventions No flowsheet data found.

## 2018-10-31 ENCOUNTER — Other Ambulatory Visit: Payer: Self-pay

## 2018-10-31 ENCOUNTER — Encounter: Payer: Self-pay | Admitting: Family Medicine

## 2018-10-31 ENCOUNTER — Ambulatory Visit (INDEPENDENT_AMBULATORY_CARE_PROVIDER_SITE_OTHER): Payer: Federal, State, Local not specified - PPO | Admitting: Family Medicine

## 2018-10-31 VITALS — BP 128/82 | HR 76 | Temp 98.3°F | Ht 71.0 in | Wt 226.8 lb

## 2018-10-31 DIAGNOSIS — I610 Nontraumatic intracerebral hemorrhage in hemisphere, subcortical: Secondary | ICD-10-CM

## 2018-10-31 DIAGNOSIS — E785 Hyperlipidemia, unspecified: Secondary | ICD-10-CM

## 2018-10-31 DIAGNOSIS — I10 Essential (primary) hypertension: Secondary | ICD-10-CM | POA: Diagnosis not present

## 2018-10-31 DIAGNOSIS — I69018 Other symptoms and signs involving cognitive functions following nontraumatic subarachnoid hemorrhage: Secondary | ICD-10-CM | POA: Diagnosis not present

## 2018-10-31 DIAGNOSIS — Z720 Tobacco use: Secondary | ICD-10-CM

## 2018-10-31 DIAGNOSIS — E1165 Type 2 diabetes mellitus with hyperglycemia: Secondary | ICD-10-CM

## 2018-10-31 NOTE — Progress Notes (Signed)
Subjective:     Patient ID: Ronald [name], male   DOB: 02-Jul-1945, 73 y.o.   MRN: [id number]  HPI   Patient is seen for hospital follow-up following recent stroke.  His chronic problems include history of atrial fibrillation, hypertension, combined systolic and diastolic heart failure, ongoing nicotine use, obstructive sleep apnea, type 2 diabetes, history of prostate cancer, history of BPH, metabolic syndrome  Patient was admitted on 14 July.  He was feeling generally sick and very weak and EMS was called to their house.  His vitals were stable but as they were examining him he went into a generalized tonic-clonic seizure that lasted for about a minute.  He was then brought to the ER for further evaluation.  Wife had reported some cognitive changes earlier before EMS got there.  No focal weakness.  He also complained of headache for couple days prior to admission.  He was ambulating without support.  He does have atrial fibrillation and had been on Eliquis.  CT revealed intracerebral hemorrhage right frontal area with small subarachnoid hemorrhage in the setting of Eliquis therapy.  CT head and neck revealed no significant vascular occlusion.  CT venogram unremarkable.  MRI brain revealed stable acute hemorrhage right frontal lobe with some associated edema.  No underlying mass.  Echocardiogram ejection fraction 65%.  EEG showed no epileptiform features.  LDL cholesterol 77.  A1c 6.9%.  Eliquis was discontinued.  Patient was discharged on baby aspirin 81 mg daily.  He had elevated blood pressures during admission but they have been much improved since discharge.  He is back on his current blood pressure regimen.  Patient has unfortunately resumed smoking few cigarettes per day.  He sometimes drinks more than 2 alcoholic beverages per day.  Both these were discussed during his hospitalization.  He is currently on nicotine patch but just yesterday started smoking again  Since discharge he is  slightly recovered.  Still has some fatigue and malaise issues and somewhat off cognitively but improving.  No focal weakness.  No swallowing difficulties.  Past Medical History:  Diagnosis Date  . Allergic rhinitis   . Anticoagulant long-term use    eliquis  . Cardiomyopathy due to systemic disease [facility])    followed by dr harding  . COPD with emphysema [facility])    pulmologist-  dr Halford Chessman  . Dyspnea    occasional per pt  . History of colon polyps    tubular adenoma 2013  . History of gout    09-05-2017 last flare-up  05/ 2019 3 wks ago, feet  . History of sepsis 02/18/2017   per d/c note probable uti, acute chf, acute renal failure, hypoxia  . Hyperlipidemia   . Hyperplasia of prostate with lower urinary tract symptoms (LUTS)   . Hypertension   . OSA on CPAP    per study 08-03-2004  Severe OSA  . Persistent atrial fibrillation    cardiologist --  dr Dorris Carnes--  post cardioversion 05-07-2014  . Prostate cancer [facility]) urologsit-  dr Alyson Ingles--- as of 05-21-2017 per pt last PSA 11 approx.   Dx  2014--  stage T1c, Gleason 3+3=6, PSA 6.67--  Active surveillance/  04/ 2019  Stage T1b, Gleason 3+4, PSA 12.8- plan external radiation therapy    . 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 Seasonal allergies   . Sigmoid diverticulosis   . Systolic and diastolic CHF, chronic [facility])    cardiologist-  [name]  .  Tinea versicolor   . Type 2 diabetes mellitus ([place])   . Wears hearing aid in both ears    Past Surgical History:  Procedure Laterality Date  . CARDIOVERSION N/A 05/07/2014   Procedure: CARDIOVERSION;  Surgeon: [name], MD;  Location: [facility] ENDOSCOPY;  Service: Cardiovascular;  Laterality: N/A;  . CARDIOVERSION N/A 09/09/2014   Procedure: CARDIOVERSION;  Surgeon: Lelon Perla, MD;  Location: [facility] ENDOSCOPY;  Service: Cardiovascular;  Laterality: N/A;  successfully  . CATARACT EXTRACTION W/ INTRAOCULAR LENS  IMPLANT, BILATERAL   08 and 09/  2018  . COLONOSCOPY  last one 06-07-2011  . GOLD SEED IMPLANT N/A 09/09/2017   Procedure: GOLD SEED IMPLANT;  Surgeon: [name], MD;  Location: [facility];  Service: Urology;  Laterality: N/A;  . HYDROCELE EXCISION Bilateral 09/26/2015   Procedure: HYDROCELECTOMY ADULT;  Surgeon: [name], MD;  Location: [facility];  Service: Urology;  Laterality: Bilateral;  . LAMINECTOMY AND MICRODISCECTOMY LUMBAR SPINE  12-23-2003   Left L5 -- S1  . LAPAROSCOPIC BILATERAL INGUINAL HERNIA REPAIR/  UMBILICAL HERNIA REPAIR WITH MESH/  ASPIRATION LEFT HYDROCELE  07-11-2012  . NM MYOVIEW LTD  05/18/2014   Low risk study. Normal perfusion: No ischemia or infarction. Mild LV dysfunction - 46% (does not correlate with echocardiographic EF of 50-55%)  . PROSTATE BIOPSY  05/15/12   Clinically both Lobes  . SPACE OAR INSTILLATION N/A 09/09/2017   Procedure: SPACE OAR INSTILLATION;  Surgeon: [name], MD;  [facility];  Service: Urology;  Laterality: N/A;  . TEE WITHOUT CARDIOVERSION N/A 05/07/2014   Procedure: TRANSESOPHAGEAL ECHOCARDIOGRAM (TEE);  Surgeon: [name], MD;  Location[facility] ENDOSCOPY;  Service: Cardiovascular;  Laterality: N/A;   mild atherosclerosis plaque of aorta,  mild AR, MR, and TR,  no cardiac source of emboli  . TONSILLECTOMY  as child  . TRANSTHORACIC ECHOCARDIOGRAM  11/'18; 1/'19   a) In setting of sepsis: EF of 40-45%.  Diffuse hypokinesis.  No RWMA.  Biatrial enlargement.;; b) ** f/u Jan 2019: Normal LVF 55-60%** , mildly dilated aortic root(38 mm) and ascending aorta (44 mm). Compared to prior echo, LVEF has improved.  . TRANSURETHRAL RESECTION OF PROSTATE N/A 05/30/2017   Procedure: TRANSURETHRAL RESECTION OF THE PROSTATE (TURP);  Surgeon: [name], MD;  Location: [facility];  Service: Urology;  Laterality: N/A;    reports that he has been smoking cigarettes. He has a 15.00 pack-year smoking  history. He has never used smokeless tobacco. He reports current alcohol use of about 14.0 standard drinks of alcohol per week. He reports that he does not use drugs. family history includes Breast cancer in his mother; Ovarian cancer in his sister; Stroke in an other family member. No Known Allergies   Review of Systems  Constitutional: Positive for fatigue. Negative for appetite change, chills and fever.  HENT: Negative for trouble swallowing.   Respiratory: Negative for cough.   Cardiovascular: Negative for chest pain, palpitations and leg swelling.  Gastrointestinal: Negative for abdominal pain.  Musculoskeletal: Negative for back pain.  Neurological: Negative for headaches.       Objective:   Physical Exam Constitutional:      Appearance: Normal appearance.  Cardiovascular:     Rate and Rhythm: Normal rate and regular rhythm.  Pulmonary:     Effort: Pulmonary effort is normal.     Breath sounds: Normal breath sounds. No wheezing or rales.  Musculoskeletal:  Right lower leg: No edema.     Left lower leg: No edema.  Neurological:     General: No focal deficit present.     Mental Status: He is alert.     Cranial Nerves: No cranial nerve deficit.        Assessment:     #1 recent right frontal intracerebral hemorrhage with small subarachnoid hemorrhage in the setting of Eliquis.  Stable symptomatically.  He is now off Eliquis.  He is taking baby aspirin  #2 hypertension.  Currently well controlled  #3 hyperlipidemia.  Recent LDL cholesterol 77.  Goal LDL less than 70  #4 type 2 diabetes fairly well controlled with A1c 6.9%  #5 ongoing nicotine use  #6 recent seizure-tonic clonic in setting of acute ICH on Eliquis.  None since admission.  Patient on Keppra per neurology    Plan:     -He has now been taken off Eliquis -He is encouraged to keep close follow-up with neurology -Reviewed no driving for 6 months from last seizure -Start back Ozempic -Discussed  goals of therapy with good blood pressure control, good blood sugar control, good lipid control, and cessation of nicotine use -Discussed smoking cessation.  He is on nicotine patch.  He is strongly advised to quit smoking completely -Also discussed importance of moderation in alcohol and trying to reduce or eliminate alcohol -He has already been referred for outpatient OT and PT -We will plan 46-month follow-up and repeat A1c and lipids then  [name] MD [facility]

## 2018-10-31 NOTE — Patient Instructions (Addendum)
No driving for 6 months  Continue to HOLD the Eliquis.  Let me know  By next week if diarrhea not improving.   Quit smoking!  Keep alcohol < 3 drinks per day

## 2018-11-03 ENCOUNTER — Telehealth: Payer: Self-pay | Admitting: Cardiology

## 2018-11-03 NOTE — Telephone Encounter (Signed)
I saw him as he was leaving the hospital.  Sad to hear about it.  I think that the Neurologist probably will go back to Eliquis once they are certain that the risk of bleeding expansion of the stroke area is low.  I would hope that they restart Eliquis - but am ok deferring to them for the short term.  Glenetta Hew, MD

## 2018-11-03 NOTE — Telephone Encounter (Signed)
° °  Pt c/o medication issue:  1. Name of Medication: eliquis  2. How are you currently taking this medication (dosage and times per day)? 5 mg 2x daily  3. Are you having a reaction (difficulty breathing--STAT)? no  4. What is your medication issue? Should the patient be on this still?   The patient was told to stop taking this medication at his recent hospital visit , and is currently not taking any blood thinners except a baby aspirin. The wife just wants to make sure [name] is aware of this. She also wanted to know if [name] wanted to put him back on this medication

## 2018-11-03 NOTE — Telephone Encounter (Signed)
Spoke to patient's wife Dr.Harding's advice given.

## 2018-11-03 NOTE — Telephone Encounter (Signed)
Returned call to patient's wife.She stated husband had a stroke on 7/14 and was discharged 7/20.Stated he is doing much better.Stated Eliquis was stopped and a aspirin started.She wanted to make sure that is ok with Dr.Harding.Message sent to [facility].

## 2018-11-04 ENCOUNTER — Ambulatory Visit: Payer: Self-pay

## 2018-11-04 ENCOUNTER — Ambulatory Visit: Payer: Self-pay | Admitting: *Deleted

## 2018-11-04 NOTE — Telephone Encounter (Signed)
Will monitor chart for ED arrival.  

## 2018-11-04 NOTE — Telephone Encounter (Signed)
Summary: Pt's wife has concerns that pt may be experiencing stroke symptoms    Reason for CRM: Pt's wife called to state "he slept until 11:30 this morning, his mood seems suppressed. BP readings include 190/110 - 170/100 - 142/90"  She wants to know at what point should she call 911? Pt just had a stroke on Friday and experienced these exact symptoms. Please advise      Wife is calling 911 now.

## 2018-11-04 NOTE — Telephone Encounter (Signed)
Pt with h/o recent CVA having "same symptoms as when he had his stroke." Per wife, pt stated that he feels fine. Wife stated that his speech is more flat or monotone and he slept late until 11:30 this morning. Advised wife to call 911 to get pt to hospital ASAP in case of recurrence of stroke. Pt is alert. BP this am 199/110, 172/100, 142/90. Wide verbalized understanding of instructions.  Reason for Disposition . [1] Loss of speech or garbled speech AND [2] sudden onset AND [3] present now    Flat monotone speech.  Answer Assessment - Initial Assessment Questions 1. SYMPTOM: "What is the main symptom you are concerned about?" (e.g., weakness, numbness)     Flat monotone responses to questions  2. ONSET: "When did this start?" (minutes, hours, days; while sleeping)     After waking this morning 3. LAST NORMAL: "When was the last time you were normal (no symptoms)?"     yesterday . NEUROLOGIC SYMPTOMS: "Have you had any of the following symptoms: headache, dizziness, vision loss, double vision, changes in speech, unsteady on your feet?"     No 7. OTHER SYMPTOMS: "Do you have any other symptoms?"     no 8. PREGNANCY: "Is there any chance you are pregnant?" "When was your last menstrual period?"     n/a  Protocols used: NEUROLOGIC DEFICIT-A-AH

## 2018-11-04 NOTE — Telephone Encounter (Signed)
See additional TE dated 11/04/18

## 2018-11-05 NOTE — Telephone Encounter (Signed)
Per chart pt not seen in ED, unless outside of our network/[facility].   LMTCB to check on pt

## 2018-11-06 NOTE — Telephone Encounter (Signed)
Pt has not returned call.

## 2018-11-07 NOTE — Telephone Encounter (Signed)
Called patient and talked to wife Katharine Look and she stated that she did call 911 and they came out and checked him out and said he was OK. She said she was not sure but his BP went down and he ended up being OK. Katharine Look stated that everyone was very nice here!

## 2018-11-10 ENCOUNTER — Ambulatory Visit: Payer: Medicare Other | Attending: Neurology | Admitting: Physical Therapy

## 2018-11-10 ENCOUNTER — Other Ambulatory Visit: Payer: Self-pay

## 2018-11-10 ENCOUNTER — Ambulatory Visit: Payer: Medicare Other

## 2018-11-10 VITALS — BP 143/89 | HR 64

## 2018-11-10 DIAGNOSIS — R41842 Visuospatial deficit: Secondary | ICD-10-CM | POA: Diagnosis not present

## 2018-11-10 DIAGNOSIS — R41844 Frontal lobe and executive function deficit: Secondary | ICD-10-CM | POA: Insufficient documentation

## 2018-11-10 DIAGNOSIS — I69118 Other symptoms and signs involving cognitive functions following nontraumatic intracerebral hemorrhage: Secondary | ICD-10-CM | POA: Insufficient documentation

## 2018-11-10 DIAGNOSIS — R2689 Other abnormalities of gait and mobility: Secondary | ICD-10-CM

## 2018-11-10 DIAGNOSIS — M6281 Muscle weakness (generalized): Secondary | ICD-10-CM

## 2018-11-10 DIAGNOSIS — R471 Dysarthria and anarthria: Secondary | ICD-10-CM | POA: Diagnosis not present

## 2018-11-10 NOTE — Patient Instructions (Signed)
Access Code: [email or web address]  URL: [email or web address]  Date: 11/10/2018  Prepared by: Elsie Ra   Exercises  Supine Active Straight Leg Raise - 10 reps - 1-3 sets - 2x daily - 6x weekly  Forward Step Up - 10 reps - 1-2 sets - 1x daily - 7x weekly  Lateral Step Up - 10 reps - 2 sets - 2x daily - 6x weekly  Backward Walking with Counter Support - 3-5 reps - 1 sets - 2x daily - 6x weekly  Standing Tandem Balance with Counter Support - 3 reps - 1 sets - 30 hold - 2x daily - 6x weekly  Standing Single Leg Stance with Counter Support - 10 reps - 3 sets - 2x daily - 6x weekly  Sit to Stand without Arm Support - 10 reps - 1-2 sets - 2x daily - 6x weekly

## 2018-11-10 NOTE — Patient Instructions (Signed)
[facility] handout provided re: risk factors for CVA, due to pt uses tobacco.
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
## 2018-11-10 NOTE — Addendum Note (Signed)
Addended by: Debbe Odea on: 11/10/2018 05:01 PM   Modules accepted: Orders

## 2018-11-10 NOTE — Therapy (Signed)
[facility] [address] [place], [place], [address] Phone: [phone]   Fax:  [phone]  Speech Language Pathology Evaluation  Patient Details  Name: Ronald [name] MRN: [id number] Date of Birth: 1945-10-09 Referring Provider (SLP): Rosalin Hawking, MD   Encounter Date: 11/10/2018  End of Session - 11/10/18 1437    Visit Number  1    Number of Visits  1    Date for SLP Re-Evaluation  11/10/18    SLP Start Time  1404    SLP Stop Time   9    SLP Time Calculation (min)  32 min    Activity Tolerance  Patient tolerated treatment well       Past Medical History:  Diagnosis Date  . Allergic rhinitis   . Anticoagulant long-term use    eliquis  . Cardiomyopathy due to systemic disease [facility])    followed by dr harding  . COPD with emphysema [facility])    pulmologist-  dr Halford Chessman  . Dyspnea    occasional per pt  . History of colon polyps    tubular adenoma 2013  . History of gout    09-05-2017 last flare-up  05/ 2019 3 wks ago, feet  . History of sepsis 02/18/2017   per d/c note probable uti, acute chf, acute renal failure, hypoxia  . Hyperlipidemia   . Hyperplasia of prostate with lower urinary tract symptoms (LUTS)   . Hypertension   . OSA on CPAP    per study 08-03-2004  Severe OSA  . Persistent atrial fibrillation    cardiologist --  dr Dorris Carnes--  post cardioversion 05-07-2014  . Prostate cancer [facility]) urologsit-  dr Alyson Ingles--- as of 05-21-2017 per pt last PSA 11 approx.   Dx  2014--  stage T1c, Gleason 3+3=6, PSA 6.67--  Active surveillance/  04/ 2019  Stage T1b, Gleason 3+4, PSA 12.8- plan external radiation therapy    . 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 ([place])    pulmologist-  dr Halford Chessman  . Seasonal allergies   . Sigmoid diverticulosis   . Systolic and diastolic CHF, chronic [facility])    cardiologist-  [name]  . Tinea versicolor   . Type 2 diabetes mellitus ([place])   . Wears hearing  aid in both ears     Past Surgical History:  Procedure Laterality Date  . CARDIOVERSION N/A 05/07/2014   Procedure: CARDIOVERSION;  Surgeon: [name], MD;  Location: [facility] ENDOSCOPY;  Service: Cardiovascular;  Laterality: N/A;  . CARDIOVERSION N/A 09/09/2014   Procedure: CARDIOVERSION;  Surgeon: Lelon Perla, MD;  Location: [facility] ENDOSCOPY;  Service: Cardiovascular;  Laterality: N/A;  successfully  . CATARACT EXTRACTION W/ INTRAOCULAR LENS  IMPLANT, BILATERAL  08 and 09/  2018  . COLONOSCOPY  last one 06-07-2011  . GOLD SEED IMPLANT N/A 09/09/2017   Procedure: GOLD SEED IMPLANT;  Surgeon: [name], MD;  Location: [facility];  Service: Urology;  Laterality: N/A;  . HYDROCELE EXCISION Bilateral 09/26/2015   Procedure: HYDROCELECTOMY ADULT;  Surgeon: [name], MD;  Location: [facility];  Service: Urology;  Laterality: Bilateral;  . LAMINECTOMY AND MICRODISCECTOMY LUMBAR SPINE  12-23-2003   Left L5 -- S1  . LAPAROSCOPIC BILATERAL INGUINAL HERNIA REPAIR/  UMBILICAL HERNIA REPAIR WITH MESH/  ASPIRATION LEFT HYDROCELE  07-11-2012  . NM MYOVIEW LTD  05/18/2014   Low risk study. Normal perfusion: No ischemia or infarction. Mild LV dysfunction - 46% (does not  correlate with echocardiographic EF of 50-55%)  . PROSTATE BIOPSY  05/15/12   Clinically both Lobes  . SPACE OAR INSTILLATION N/A 09/09/2017   Procedure: SPACE OAR INSTILLATION;  Surgeon: [name], MD;  Location: [facility];  Service: Urology;  Laterality: N/A;  . TEE WITHOUT CARDIOVERSION N/A 05/07/2014   Procedure: TRANSESOPHAGEAL ECHOCARDIOGRAM (TEE);  Surgeon: [name], MD;  Location: [facility] ENDOSCOPY;  Service: Cardiovascular;  Laterality: N/A;   mild atherosclerosis plaque of aorta,  mild AR, MR, and TR,  no cardiac source of emboli  . TONSILLECTOMY  as child  . TRANSTHORACIC ECHOCARDIOGRAM  11/'18; 1/'19   a) In setting of sepsis: EF of 40-45%.  Diffuse hypokinesis.   No RWMA.  Biatrial enlargement.;; b) ** f/u Jan 2019: Normal LVF 55-60%** , mildly dilated aortic root(38 mm) and ascending aorta (44 mm). Compared to prior echo, LVEF has improved.  . TRANSURETHRAL RESECTION OF PROSTATE N/A 05/30/2017   Procedure: TRANSURETHRAL RESECTION OF THE PROSTATE (TURP);  Surgeon: [name], MD;  Location: [facility];  Service: Urology;  Laterality: N/A;    There were no vitals filed for this visit.  Subjective Assessment - 11/10/18 1410    Subjective  "I guess I was pretty lucky. Other than just general balance, I don't think I've lost a lot (with my speech)."    Patient is accompained by:  Family member   wife, Ronald [name]   Currently in Pain?  Yes    Pain Location  Eye    Pain Orientation  Medial    Pain Descriptors / Indicators  Headache    Pain Type  Chronic pain         SLP Evaluation [facility] - 11/10/18 1419      SLP Visit Information   SLP Received On  11/10/18    Referring Provider (SLP)  Rosalin Hawking, MD    Onset Date  10-21-18    Medical Diagnosis  CVA      Subjective   Patient/Family Stated Goal  Ascertain whether ST is necessary or not.       Pain Assessment   Currently in Pain?  Yes    Pain Location  Eye    Pain Orientation  Medial    Pain Type  Chronic pain      General Information   HPI  Ronald [name] is a 73 y.o. male who has past medical history of persistent atrial fibrillation, COPD with emphysema, hypertension, obstructive sleep apnea, hyperlipidemia, prostatic hyperplasia, colonic polyps, tobacco abuse, chronic congestive systolic and diastolic heart failure, presented to the emergency room via EMS as a code stroke for witnessed seizure activity for about 1 minute. Per chart wife reported pt was very confused and unable to talk well. MRI stable acute hemorrhage within right parafalcine frontal lobe well as associated edema and local mass effect. Pt states he is baseline for cognitive communication function; pt's wife reports he  exhibited slurred speech while in the hospital, but now does not exhibit unintelligible/slurred speech.       Prior Functional Status   Cognitive/Linguistic Baseline  Within functional limits    Type of Home  House     Lives With  Spouse    Available Support  Family    Education  Bachelor's     Vocation  Part time employment   sales     Cognition   Overall Cognitive Status  Within Functional Limits for tasks assessed      Auditory Comprehension  Overall Auditory Comprehension  Appears within functional limits for tasks assessed      Verbal Expression   Overall Verbal Expression  Appears within functional limits for tasks assessed      Oral Motor/Sensory Function   Overall Oral Motor/Sensory Function  Other (comment)   deferred due to clinic masking policy     Motor Speech   Overall Motor Speech  Appears within functional limits for tasks assessed    Respiration  Within functional limits    Phonation  Normal    Resonance  Within functional limits    Articulation  Within functional limitis   pt states he thinks speech is baseline - wife agrees   Armed forces operational officer  Witnin functional limits       SLP engaged pt in conversation for 20 minutes over a myriad of topics, SLP assessed pt's speech production as WNL. See "plan" for details.                SLP Education - 11/10/18 1436    Education Details  signs/symptoms CVA, risk factors for CVA, basics of what OT eval will assess    Person(s) Educated  Patient;Spouse    Methods  Explanation;Handout    Comprehension  Verbalized understanding           Plan - 11/10/18 1437    Clinical Impression Statement  Pt presents today with baesline speech articulation/intelligibility, per pt and wife. Wife states pt had some slurring of speech when inpatient but this has resolved. Pt cognition was appropriate for evaluation tasks; attention, problem solving, executive function appeared WNL; pt  with what appears to be possible WNL memory defiicts - asking wife for details of medical hx x2. Denies any overt s/s aspiration with meals or liquids, denies word finding/dysnomia and difficulty with auditory comprehension. Pt does not require ST at this time.    Speech Therapy Frequency  One time visit    Consulted and Agree with Plan of Care  Patient       Patient will benefit from skilled therapeutic intervention in order to improve the following deficits and impairments:   1. Dysarthria and anarthria       Problem List Patient Active Problem List   Diagnosis Date Noted  . Seizures ([place]), secondsry to [facility] 10/23/2018  . Coagulopathy ([place]), Eliquis 10/23/2018  . Cerebral edema ([place]) 10/23/2018  . Hypertensive emergency 10/23/2018  . Advanced age 74/16/2020  . ICH (intracerebral hemorrhage) ([place]) w/ SAH while on Eliquis 10/21/2018  . Hepatic steatosis 04/22/2018  . Actinic keratoses 06/07/2017  . BPH (benign prostatic hyperplasia) 05/30/2017  . Cardiomyopathy due to systemic disease ([place]) 03/09/2017  . Preop cardiovascular exam 03/07/2017  . Hypervolemia   . Chronic combined systolic and diastolic CHF (congestive heart failure) ([place])   . Hypoxia 02/21/2017  . ARF (acute renal failure) ([place])   . Hypomagnesemia 02/20/2017  . GI bleed 02/20/2017  . Severe sepsis ([place]) 02/19/2017  . History of colonic polyps 07/03/2016  . Chronic anticoagulation 07/03/2016  . Hyperglycemia, drug-induced 04/05/2015  . 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 On amiodarone therapy 12/08/2014  . Edema of both legs 12/08/2014  . Exertional dyspnea 08/18/2014  . Hypokalemia   . Tobacco abuse   . Paroxysmal atrial fibrillation ([place]) 05/04/2014  . Cigarette smoker two packs a day or less   . Prostate cancer ([place]) 10/15/2013  . Obesity (BMI 30-39.9) 04/17/2013  . Umbilical hernia 62/95/2841  .  Right inguinal hernia 06/23/2012  . Hydrocele 06/19/2012  .  Metabolic syndrome 61/44/3154  . Type 2 diabetes mellitus with hyperglycemia ([place]) 03/20/2012  . Elevated PSA 03/20/2012  . GOUT, UNSPECIFIED 10/07/2009  . TINEA VERSICOLOR 07/19/2009  . PERS HX TOBACCO USE PRESENTING HAZARDS HEALTH 07/19/2009  . Obstructive sleep apnea 07/02/2008  . Hyperlipidemia with target LDL less than 70 07/01/2008  . Essential hypertension 07/01/2008  . ALLERGIC RHINITIS 07/01/2008    [facility] ,[place], CCC-SLP  11/10/2018, 2:41 PM  [facility] [address] [place], [place], [address] Phone: [phone]   Fax:  [phone]  Name: Ronald [name] MRN: [id number] Date of Birth: May 26, 1945

## 2018-11-13 ENCOUNTER — Emergency Department (HOSPITAL_COMMUNITY): Payer: Medicare Other

## 2018-11-13 ENCOUNTER — Emergency Department (HOSPITAL_COMMUNITY)
Admission: EM | Admit: 2018-11-13 | Discharge: 2018-11-14 | Disposition: A | Discharge status: Home / Self Care | Payer: Medicare Other | Attending: Emergency Medicine | Admitting: Emergency Medicine

## 2018-11-13 ENCOUNTER — Telehealth: Payer: Self-pay | Admitting: Adult Health

## 2018-11-13 ENCOUNTER — Other Ambulatory Visit: Payer: Self-pay

## 2018-11-13 ENCOUNTER — Ambulatory Visit: Payer: Medicare Other | Admitting: Occupational Therapy

## 2018-11-13 ENCOUNTER — Encounter (HOSPITAL_COMMUNITY): Payer: Self-pay

## 2018-11-13 DIAGNOSIS — Z8546 Personal history of malignant neoplasm of prostate: Secondary | ICD-10-CM | POA: Diagnosis not present

## 2018-11-13 DIAGNOSIS — I639 Cerebral infarction, unspecified: Secondary | ICD-10-CM | POA: Diagnosis not present

## 2018-11-13 DIAGNOSIS — F17210 Nicotine dependence, cigarettes, uncomplicated: Secondary | ICD-10-CM | POA: Insufficient documentation

## 2018-11-13 DIAGNOSIS — M6281 Muscle weakness (generalized): Secondary | ICD-10-CM

## 2018-11-13 DIAGNOSIS — R4182 Altered mental status, unspecified: Secondary | ICD-10-CM | POA: Diagnosis present

## 2018-11-13 DIAGNOSIS — I509 Heart failure, unspecified: Secondary | ICD-10-CM | POA: Insufficient documentation

## 2018-11-13 DIAGNOSIS — R41842 Visuospatial deficit: Secondary | ICD-10-CM

## 2018-11-13 DIAGNOSIS — R410 Disorientation, unspecified: Secondary | ICD-10-CM

## 2018-11-13 DIAGNOSIS — J449 Chronic obstructive pulmonary disease, unspecified: Secondary | ICD-10-CM | POA: Diagnosis not present

## 2018-11-13 DIAGNOSIS — Z7982 Long term (current) use of aspirin: Secondary | ICD-10-CM | POA: Diagnosis not present

## 2018-11-13 DIAGNOSIS — I69118 Other symptoms and signs involving cognitive functions following nontraumatic intracerebral hemorrhage: Secondary | ICD-10-CM

## 2018-11-13 DIAGNOSIS — Z79899 Other long term (current) drug therapy: Secondary | ICD-10-CM | POA: Diagnosis not present

## 2018-11-13 DIAGNOSIS — I110 Hypertensive heart disease with heart failure: Secondary | ICD-10-CM | POA: Insufficient documentation

## 2018-11-13 DIAGNOSIS — R41844 Frontal lobe and executive function deficit: Secondary | ICD-10-CM

## 2018-11-13 LAB — COMPREHENSIVE METABOLIC PANEL
ALT: 17 U/L (ref 0–44)
AST: 13 U/L — ABNORMAL LOW (ref 15–41)
Albumin: 3.2 g/dL — ABNORMAL LOW (ref 3.5–5.0)
Alkaline Phosphatase: 74 U/L (ref 38–126)
Anion gap: 11 (ref 5–15)
BUN: 11 mg/dL (ref 8–23)
CO2: 19 mmol/L — ABNORMAL LOW (ref 22–32)
Calcium: 8.5 mg/dL — ABNORMAL LOW (ref 8.9–10.3)
Chloride: 106 mmol/L (ref 98–111)
Creatinine, Ser: 1.36 mg/dL — ABNORMAL HIGH (ref 0.61–1.24)
GFR calc Af Amer: 59 mL/min — ABNORMAL LOW (ref >60)
GFR calc non Af Amer: 51 mL/min — ABNORMAL LOW (ref >60)
Glucose, Bld: 95 mg/dL (ref 70–99)
Potassium: 4.2 mmol/L (ref 3.5–5.1)
Sodium: 136 mmol/L (ref 135–145)
Total Bilirubin: 0.7 mg/dL (ref 0.3–1.2)
Total Protein: 5.8 g/dL — ABNORMAL LOW (ref 6.5–8.1)

## 2018-11-13 LAB — CBC
HCT: 40.5 % (ref 39.0–52.0)
Hemoglobin: 13.4 g/dL (ref 13.0–17.0)
MCH: 33.6 pg (ref 26.0–34.0)
MCHC: 33.1 g/dL (ref 30.0–36.0)
MCV: 101.5 fL — ABNORMAL HIGH (ref 80.0–100.0)
Platelets: 183 10*3/uL (ref 150–400)
RBC: 3.99 MIL/uL — ABNORMAL LOW (ref 4.22–5.81)
RDW: 12.1 % (ref 11.5–15.5)
WBC: 7.2 10*3/uL (ref 4.0–10.5)
nRBC: 0 % (ref 0.0–0.2)

## 2018-11-13 LAB — DIFFERENTIAL
Abs Immature Granulocytes: 0.04 10*3/uL (ref 0.00–0.07)
Basophils Absolute: 0 10*3/uL (ref 0.0–0.1)
Basophils Relative: 0 %
Eosinophils Absolute: 0.1 10*3/uL (ref 0.0–0.5)
Eosinophils Relative: 1 %
Immature Granulocytes: 1 %
Lymphocytes Relative: 15 %
Lymphs Abs: 1.1 10*3/uL (ref 0.7–4.0)
Monocytes Absolute: 0.7 10*3/uL (ref 0.1–1.0)
Monocytes Relative: 9 %
Neutro Abs: 5.3 10*3/uL (ref 1.7–7.7)
Neutrophils Relative %: 74 %

## 2018-11-13 LAB — I-STAT CHEM 8, ED
BUN: 12 mg/dL (ref 8–23)
Calcium, Ion: 1.13 mmol/L — ABNORMAL LOW (ref 1.15–1.40)
Chloride: 104 mmol/L (ref 98–111)
Creatinine, Ser: 1.3 mg/dL — ABNORMAL HIGH (ref 0.61–1.24)
Glucose, Bld: 92 mg/dL (ref 70–99)
HCT: 39 % (ref 39.0–52.0)
Hemoglobin: 13.3 g/dL (ref 13.0–17.0)
Potassium: 4.2 mmol/L (ref 3.5–5.1)
Sodium: 136 mmol/L (ref 135–145)
TCO2: 21 mmol/L — ABNORMAL LOW (ref 22–32)

## 2018-11-13 LAB — URINALYSIS, ROUTINE W REFLEX MICROSCOPIC
Bilirubin Urine: NEGATIVE
Glucose, UA: NEGATIVE mg/dL
Hgb urine dipstick: NEGATIVE
Ketones, ur: NEGATIVE mg/dL
Leukocytes,Ua: NEGATIVE
Nitrite: NEGATIVE
Protein, ur: NEGATIVE mg/dL
Specific Gravity, Urine: 1.003 — ABNORMAL LOW (ref 1.005–1.030)
pH: 5 (ref 5.0–8.0)

## 2018-11-13 LAB — APTT: aPTT: 25 seconds (ref 24–36)

## 2018-11-13 LAB — PROTIME-INR
INR: 1.1 (ref 0.8–1.2)
Prothrombin Time: 13.7 seconds (ref 11.4–15.2)

## 2018-11-13 MED ORDER — SODIUM CHLORIDE 0.9% FLUSH
3.0000 mL | Freq: Once | INTRAVENOUS | Status: AC
  Administered 2018-11-13: 3 mL via INTRAVENOUS

## 2018-11-13 NOTE — Telephone Encounter (Signed)
I called and LMVM for pts wife.  I sent message to JV/NP she recommended to have pt evaluated at ED due to sx presented.  I left number to call back.

## 2018-11-13 NOTE — Telephone Encounter (Signed)
I called wife.  Pt having some cognitive decline, more balance issues and headaches. (confusionm remembering).  He had stroke 10-21-18 , saw pcp Dr. Elease Hashimoto 10-31-18.  From that time has had some decline.  Nothing else physically per wife.  He was at OT this am and they noted this as well.  I offered 10-25-18 as had cancellation, but she will keep 20th as only 2 days prior to original appt.  JV/NP has not seen pt.  I relayed to have pcp evaluate initially.  She will call.

## 2018-11-13 NOTE — ED Provider Notes (Signed)
[facility] DEPARTMENT Provider Note   CSN: [id number] Arrival date & time: 11/13/18  1705    History   Chief Complaint Chief Complaint  Patient presents with   Altered Mental Status   Weakness    HPI Ronald [name] is a 73 y.o. male.     HPI Patient brought in for mental status changes.  Around 3 weeks ago had a hemorrhagic stroke.  Reportedly was frontal.  Has had some cognitive issues since then.  Has been at rehab.  Over the last couple days however patient has been more confused.  Having more difficulty at rehab.  No fevers or chills.  Has had overall good oral intake.  No dysuria.  Somewhat difficult to get history with some of his confusion.  Has had good days and bad days before but usually not this bad.  Patient's wife discussed with neurology and was told to come into the ER.  Had previously been on Eliquis for A. fib but stopped with the bleeding. Past Medical History:  Diagnosis Date   Allergic rhinitis    Anticoagulant long-term use    eliquis   Cardiomyopathy due to systemic disease ([place])    followed by dr harding   COPD with emphysema [facility])    pulmologist-  dr Halford Chessman   Dyspnea    occasional per pt   History of colon polyps    tubular adenoma 2013   History of gout    09-05-2017 last flare-up  05/ 2019 3 wks ago, feet   History of sepsis 02/18/2017   per d/c note probable uti, acute chf, acute renal failure, hypoxia   Hyperlipidemia    Hyperplasia of prostate with lower urinary tract symptoms (LUTS)    Hypertension    OSA on CPAP    per study 08-03-2004  Severe OSA   Persistent atrial fibrillation    cardiologist --  dr Dorris Carnes--  post cardioversion 05-07-2014   Prostate cancer [facility]) urologsit-  dr Alyson Ingles--- as of 05-21-2017 per pt last PSA 11 approx.   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 ([place])    pulmologist-  dr Halford Chessman   Seasonal allergies    Sigmoid diverticulosis    Systolic and diastolic CHF, chronic [facility])    cardiologist-  [name]   Tinea versicolor    Type 2 diabetes mellitus ([place])    Wears hearing aid in both ears     Patient Active Problem List   Diagnosis Date Noted   Seizures ([place]), secondsry to [facility] 10/23/2018   Coagulopathy ([place]), Eliquis 10/23/2018   Cerebral edema ([place]) 10/23/2018   Hypertensive emergency 10/23/2018   Advanced age 57/16/2020   [place] (intracerebral hemorrhage) ([place]) w/ SAH while on Eliquis 10/21/2018   Hepatic steatosis 04/22/2018   Actinic keratoses 06/07/2017   BPH (benign prostatic hyperplasia) 05/30/2017   Cardiomyopathy due to systemic disease ([place]) 03/09/2017   Preop cardiovascular exam 03/07/2017   Hypervolemia    Chronic combined systolic and diastolic CHF (congestive heart failure) ([place])    Hypoxia 02/21/2017   ARF (acute renal failure) ([place])    Hypomagnesemia 02/20/2017   GI bleed 02/20/2017   Severe sepsis ([place]) 02/19/2017   History of colonic polyps 07/03/2016   Chronic anticoagulation 07/03/2016   Hyperglycemia, drug-induced 04/05/2015   Respiratory bronchiolitis associated  interstitial lung disease ([place]) 02/21/2015   On amiodarone therapy 12/08/2014   Edema of both legs 12/08/2014   Exertional dyspnea 08/18/2014   Hypokalemia    Tobacco abuse    Paroxysmal atrial fibrillation (H[facility]) 05/04/2014   Cigarette smoker two packs a day or less    Prostate cancer ([place]) 10/15/2013   Obesity (BMI 30-39.9) 08/65/7846   Umbilical hernia 96/29/5284   Right inguinal hernia 06/23/2012   Hydrocele 13/24/4010   Metabolic syndrome 27/25/3664   Type 2 diabetes mellitus with hyperglycemia ([place]) 03/20/2012   Elevated PSA 03/20/2012   GOUT, UNSPECIFIED 10/07/2009   TINEA VERSICOLOR 07/19/2009   PERS HX TOBACCO USE  PRESENTING HAZARDS HEALTH 07/19/2009   Obstructive sleep apnea 07/02/2008   Hyperlipidemia with target LDL less than 70 07/01/2008   Essential hypertension 07/01/2008   ALLERGIC RHINITIS 07/01/2008    Past Surgical History:  Procedure Laterality Date   CARDIOVERSION N/A 05/07/2014   Procedure: CARDIOVERSION;  Surgeon: [name], MD;  Location: [facility];  Service: Cardiovascular;  Laterality: N/A;   CARDIOVERSION N/A 09/09/2014   Procedure: CARDIOVERSION;  Surgeon: Lelon Perla, MD;  Location: [facility] ENDOSCOPY;  Service: Cardiovascular;  Laterality: N/A;  successfully   CATARACT EXTRACTION W/ INTRAOCULAR LENS  IMPLANT, BILATERAL  08 and 09/  2018   COLONOSCOPY  last one 06-07-2011   GOLD SEED IMPLANT N/A 09/09/2017   Procedure: GOLD SEED IMPLANT;  Surgeon: [name], MD;  Location: [facility];  Service: Urology;  Laterality: N/A;   HYDROCELE EXCISION Bilateral 09/26/2015   Procedure: HYDROCELECTOMY ADULT;  Surgeon: [name], MD;  Location: [facility];  Service: Urology;  Laterality: Bilateral;   LAMINECTOMY AND MICRODISCECTOMY LUMBAR SPINE  12-23-2003   Left L5 -- S1   LAPAROSCOPIC BILATERAL INGUINAL HERNIA REPAIR/  UMBILICAL HERNIA REPAIR WITH MESH/  ASPIRATION LEFT HYDROCELE  07-11-2012   NM MYOVIEW LTD  05/18/2014   Low risk study. Normal perfusion: No ischemia or infarction. Mild LV dysfunction - 46% (does not correlate with echocardiographic EF of 50-55%)   PROSTATE BIOPSY  05/15/12   Clinically both [place]ob[place]   SPACE OAR INSTILLATION N/A 09/09/2017   Procedure: SPACE OAR INSTILLATION;  Surgeon: [name], MD;  Location: [facility];  Service: Urology;  Laterality: N/A;   TEE WITHOUT CARDIOVERSION N/A 05/07/2014   Procedure: TRANSESOPHAGEAL ECHOCARDIOGRAM (TEE);  Surgeon: [name], MD;  Location: [facility] ENDOSCOPY;  Service: Cardiovascular;  Laterality: N/A;   mild atherosclerosis plaque of aorta,   mild AR, MR, and TR,  no cardiac source of emboli   TONSILLECTOMY  as child   TRANSTHORACIC ECHOCARDIOGRAM  11/'18; 1/'19   a) In setting of sepsis: EF of 40-45%.  Diffuse hypokinesis.  No RWMA.  Biatrial enlargement.;; b) ** f/u Jan 2019: Normal LVF 55-60%** , mildly dilated aortic root(38 mm) and ascending aorta (44 mm). Compared to prior echo, LVEF has improved.   TRANSURETHRAL RESECTION OF PROSTATE N/A 05/30/2017   Procedure: TRANSURETHRAL RESECTION OF THE PROSTATE (TURP);  Surgeon: [name], MD;  Location: [facility];  Service: Urology;  Laterality: N/A;        Home Medications    Prior to Admission medications   Medication Sig Start Date End Date Taking? Authorizing Provider  albuterol (VENTOLIN HFA) 108 (90 Base) MCG/ACT inhaler Inhale 2 puffs into the lungs every 6 (six) hours as needed for wheezing or shortness of breath. 03/12/18   Chesley Mires, MD  amiodarone (PACERONE) 200  MG tablet Take 1 tablet (200 mg total) by mouth daily. 10/27/18   Leonie Man, MD  aspirin EC 81 MG EC tablet Take 1 tablet (81 mg total) by mouth daily. 10/28/18   Donzetta Starch, NP  atorvastatin (LIPITOR) 10 MG tablet TAKE 1 TABLET BY MOUTH DAILY EACH EVENING Patient taking differently: Take 10 mg by mouth at bedtime.  06/26/18   Burchette, Alinda Sierras, MD  azelastine (ASTELIN) 0.1 % nasal spray Place 1 spray into both nostrils 2 (two) times daily as needed for rhinitis or allergies. 10/27/18   Donzetta Starch, NP  Blood Glucose Monitoring Suppl (ACCU-CHEK AVIVA PLUS) w/Device KIT Use blood glucose machine as instructed on your strips 04/22/18   Burchette, Alinda Sierras, MD  budesonide-formoterol (SYMBICORT) 160-4.5 MCG/ACT inhaler Inhale 2 puffs into the lungs 2 (two) times daily. 03/26/18   Chesley Mires, MD  butalbital-acetaminophen-caffeine (FIORICET) [phone] MG tablet Take 1 tablet by mouth every 8 (eight) hours as needed for headache. 10/27/18   Donzetta Starch, NP  carvedilol (COREG) 12.5 MG tablet TAKE 1  TABLET (12.5 MG TOTAL) BY MOUTH 2 (TWO) TIMES DAILY. 08/08/18 11/06/18  Leonie Man, MD  colchicine 0.6 MG tablet Take 1-2 tablets (0.6-1.2 mg total) by mouth See admin instructions. Take 1.2 mg by mouth at onset of gout flare, then 0.6 mg two times a day as needed for flare(s) 10/27/18   Donzetta Starch, NP  furosemide (LASIX) 40 MG tablet TAKE 1-2 TABLETS DAILY AS NEEDED. 10/27/18   Donzetta Starch, NP  glucose blood (ACCU-CHEK AVIVA PLUS) test strip Test 2 times daily dx e11.9 08/20/18   Burchette, Alinda Sierras, MD  KLOR-CON M20 20 MEQ tablet TAKE 2 TABLETS BY MOUTH EVERY DAY Patient taking differently: Take 40 mEq by mouth daily.  10/21/18   Burchette, Alinda Sierras, MD  Lancets (ACCU-CHEK SOFT TOUCH) lancets Check blood sugars once per day. DX: E11.9 10/24/15   Burchette, Alinda Sierras, MD  levETIRAcetam (KEPPRA) 500 MG tablet Take 1 tablet (500 mg total) by mouth 2 (two) times daily. 10/27/18   Donzetta Starch, NP  losartan (COZAAR) 25 MG tablet TAKE 1 TABLET BY MOUTH EVERY DAY Patient taking differently: Take 25 mg by mouth daily.  02/03/18   Leonie Man, MD  metFORMIN (GLUCOPHAGE) 500 MG tablet Take 2 tablets (1,000 mg total) by mouth 2 (two) times daily with a meal. 10/14/18   Burchette, Alinda Sierras, MD  metoprolol tartrate (LOPRESSOR) 25 MG tablet Take  1 tablet if  you have breakthrough atrial fib with extra dose of 200 mg Amiodarone. Patient taking differently: Take 25 mg by mouth See admin instructions. Take 25 mg by mouth for breakthrough A-Fib (with an extra dose of 200 mg Amiodarone) 07/30/17   Leonie Man, MD  montelukast (SINGULAIR) 10 MG tablet Take 1 tablet (10 mg total) by mouth every evening. 10/27/18   Donzetta Starch, NP  nicotine (NICODERM CQ - DOSED IN MG/24 HOURS) 14 mg/24hr patch Place 1 patch (14 mg total) onto the skin daily. 10/28/18   Donzetta Starch, NP  PRESCRIPTION MEDICATION CPAP- At bedtime    [provider]  Semaglutide,0.25 or 0.5MG/DOS, (OZEMPIC, 0.25 OR 0.5 MG/DOSE,) 2  MG/1.5ML SOPN Inject 0.5 mg into the skin every Sunday. 11/02/18   Donzetta Starch, NP  triamcinolone cream (KENALOG) 0.1 % Apply 1 application topically daily as needed (to affected sites).     [provider]    Family History  Family History  Problem Relation Age of Onset   Breast cancer Mother    Stroke Other        Unknown    Ovarian cancer Sister    Colon cancer Neg Hx    Esophageal cancer Neg Hx    Rectal cancer Neg Hx    Stomach cancer Neg Hx     Social History Social History   Tobacco Use   Smoking status: Current Every Day Smoker    Packs/day: 0.25    Years: 60.00    Pack years: 15.00    Types: Cigarettes    Last attempt to quit: 04/11/2018    Years since quitting: 0.5   Smokeless tobacco: Never Used   Tobacco comment: 09-05-2017 tried quitting smoking 11/ 2018 but started back at 0.75ppd from 1ppd  Substance Use Topics   Alcohol use: Yes    Alcohol/week: 14.0 standard drinks    Types: 14 Standard drinks or equivalent per week    Comment: 2 drinks daily   Drug use: No     Allergies   Patient has no known allergies.   Review of Systems Review of Systems  Constitutional: Negative for appetite change and chills.  HENT: Negative for congestion.   Respiratory: Negative for shortness of breath.   Cardiovascular: Negative for chest pain.  Gastrointestinal: Negative for abdominal pain.  Genitourinary: Negative for flank pain.  Musculoskeletal: Negative for back pain.  Skin: Negative for rash.  Neurological: Negative for weakness.  Psychiatric/Behavioral: Positive for confusion.     Physical Exam Updated Vital Signs BP (!) 169/95    Pulse 65    Temp 98.9 F (37.2 C) (Oral)    Resp (!) 21    Ht '5\' 11"'  (1.803 m)    Wt 100.2 kg    SpO2 93%    BMI 30.82 kg/m   Physical Exam Vitals signs and nursing note reviewed.  HENT:     Head: Normocephalic.  Cardiovascular:     Rate and Rhythm: Regular rhythm.  Pulmonary:     Breath sounds:  Normal breath sounds.  Abdominal:     Tenderness: There is no abdominal tenderness.  Skin:    General: Skin is warm.     Capillary Refill: Capillary refill takes less than 2 seconds.  Neurological:     Mental Status: He is alert.     Comments: Awake and pleasant.  Moves all extremities.  However has some confusion.  Mildly more confused than baseline per family member.      ED Treatments / Results  Labs (all labs ordered are listed, but only abnormal results are displayed) Labs Reviewed  CBC - Abnormal; Notable for the following components:      Result Value   RBC 3.99 (*)    MCV 101.5 (*)    All other components within normal limits  COMPREHENSIVE METABOLIC PANEL - Abnormal; Notable for the following components:   CO2 19 (*)    Creatinine, Ser 1.36 (*)    Calcium 8.5 (*)    Total Protein 5.8 (*)    Albumin 3.2 (*)    AST 13 (*)    GFR calc non Af Amer 51 (*)    GFR calc Af Amer 59 (*)    All other components within normal limits  I-STAT CHEM 8, ED - Abnormal; Notable for the following components:   Creatinine, Ser 1.30 (*)    Calcium, Ion 1.13 (*)    TCO2 21 (*)    All other  components within normal limits  PROTIME-INR  APTT  DIFFERENTIAL  URINALYSIS, ROUTINE W REFLEX MICROSCOPIC  CBG MONITORING, ED    EKG None  Radiology Ct Head Wo Contrast  Result Date: 11/13/2018 CLINICAL DATA:  Follow-up hemorrhagic infarct. Increased confusion, weakness and lethargy. EXAM: CT HEAD WITHOUT CONTRAST TECHNIQUE: Contiguous axial images were obtained from the base of the skull through the vertex without intravenous contrast. COMPARISON:  Head CT 10/23/2018 FINDINGS: Brain: Evolutionary changes in the right ACA hemorrhagic infarct. The hematoma is much smaller now measuring a maximum of 2 cm and previously measuring 4 cm. There is also been contraction of the area of infarct with some residual encephalomalacia. No findings to suggest a hree bleed or new stroke. The ventricles are in  the midline. Stable mild mass effect on the frontal horn on the right side. No extra-axial fluid collections. Brainstem and cerebellum appear normal and stable. Vascular: Stable advanced vascular calcifications but no definite aneurysm or hyperdense vessels. Skull: No skull fractures or bone lesions Sinuses/Orbits: . the paranasal sinuses and mastoid air cells are clear. The globes are intact. Other: No scalp lesions or hematoma. IMPRESSION: 1. Expected evolutionary changes in the right ACA territory infarct with contraction of the hematoma and expected encephalomalacia. 2. No new/acute intracranial findings. Electronically Signed   By: Marijo Sanes M.D.   On: 11/13/2018 18:48    Procedures Procedures (including critical care time)  Medications Ordered in ED Medications  sodium chloride flush (NS) 0.9 % injection 3 mL (3 mLs Intravenous Given 11/13/18 2107)     Initial Impression / Assessment and Plan / ED Course  I have reviewed the triage vital signs and the nursing notes.  Pertinent labs & imaging results that were available during my care of the patient were reviewed by me and considered in my medical decision making (see chart for details).        Patient with confusion.  Recent intracranial hemorrhage.  No new intracranial finding on CT scan.  Urinalysis pending lab work which shows potentially mild dehydration.  Will get MRI to evaluate for stroke since has atrial fibrillation and is off Eliquis.  Care will be turned over to oncoming provider.  Final Clinical Impressions(s) / ED Diagnoses   Final diagnoses:  Confusion    ED Discharge Orders    None       Davonna Belling, MD 11/13/18 2309

## 2018-11-13 NOTE — Telephone Encounter (Signed)
I would highly recommend patient proceed to ED for further evaluation of headaches, cognitive decline and worsening balance.  Will be important to rule out any worsening bleed or acute stroke.
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
## 2018-11-13 NOTE — ED Notes (Signed)
Pt aware that we need urine specimen. 

## 2018-11-13 NOTE — Telephone Encounter (Signed)
Pt was at OT today. They sent wife over to tell us that he has had cognitive decline since Tues this week. Best call back [phone].

## 2018-11-13 NOTE — ED Triage Notes (Signed)
Pt arrives POV for eval of increased confusion, weakness, lethargy. Wife reports "seems a little more off than usual". Pt has CVA on 7/14, was dc'd and doing well at home. Wife reports no "physical residual, but all cognitive deficits". Wife reports pt has been dc'd from eliquis since CVA, only taking 81mg  ASA. Pt is A&Ox4, no obvious neuro deficits in triage.

## 2018-11-13 NOTE — ED Notes (Signed)
Pt climbed out of bed and was standing when this nurse walked in. Pt was disoriented and confused. Voided on the floor and his bed. Linens changed and pt sent to MRI.

## 2018-11-14 ENCOUNTER — Other Ambulatory Visit: Payer: Self-pay

## 2018-11-14 ENCOUNTER — Encounter: Payer: Self-pay | Admitting: Family Medicine

## 2018-11-14 ENCOUNTER — Ambulatory Visit (INDEPENDENT_AMBULATORY_CARE_PROVIDER_SITE_OTHER): Payer: Medicare Other | Admitting: Family Medicine

## 2018-11-14 VITALS — BP 130/86 | HR 78 | Temp 98.6°F | Ht 71.0 in | Wt 226.8 lb

## 2018-11-14 DIAGNOSIS — I10 Essential (primary) hypertension: Secondary | ICD-10-CM

## 2018-11-14 DIAGNOSIS — I610 Nontraumatic intracerebral hemorrhage in hemisphere, subcortical: Secondary | ICD-10-CM | POA: Diagnosis not present

## 2018-11-14 DIAGNOSIS — R410 Disorientation, unspecified: Secondary | ICD-10-CM | POA: Diagnosis not present

## 2018-11-14 DIAGNOSIS — R4182 Altered mental status, unspecified: Secondary | ICD-10-CM | POA: Diagnosis not present

## 2018-11-14 NOTE — Progress Notes (Signed)
Subjective:     Patient ID: Ronald [name], male   DOB: [birth year], 73 y.o.   MRN: [id number]  HPI Patient is seen with some intermittent confusion following recent hemorrhagic CVA.  He has recent rehab session and therapist and his wife thought he was more confused than he had been.  He was discussed with neurology and they advised ER evaluation.  Refer to recent note for details regarding his recent stroke from 10/31/2018  Patient is seen for hospital follow-up following recent stroke.  His chronic problems include history of atrial fibrillation, hypertension, combined systolic and diastolic heart failure, ongoing nicotine use, obstructive sleep apnea, type 2 diabetes, history of prostate cancer, history of BPH, metabolic syndrome  Patient was admitted on 14 July.  He was feeling generally sick and very weak and EMS was called to their house.  His vitals were stable but as they were examining him he went into a generalized tonic-clonic seizure that lasted for about a minute.  He was then brought to the ER for further evaluation.  Wife had reported some cognitive changes earlier before EMS got there.  No focal weakness.  He also complained of headache for couple days prior to admission.  He was ambulating without support.  He does have atrial fibrillation and had been on Eliquis.  CT revealed intracerebral hemorrhage right frontal area with small subarachnoid hemorrhage in the setting of Eliquis therapy.  CT head and neck revealed no significant vascular occlusion.  CT venogram unremarkable.  MRI brain revealed stable acute hemorrhage right frontal lobe with some associated edema.  No underlying mass.  Echocardiogram ejection fraction 65%.  EEG showed no epileptiform features.  LDL cholesterol 77.  A1c 6.9%.  Eliquis was discontinued.  Patient was discharged on baby aspirin 81 mg daily.  He had elevated blood pressures during admission but they have been much improved since discharge.  He is back on  his current blood pressure regimen.  Patient has unfortunately resumed smoking few cigarettes per day.  He sometimes drinks more than 2 alcoholic beverages per day.  Both these were discussed during his hospitalization.  He is currently on nicotine patch but just yesterday started smoking again  Since discharge he is slightly recovered.  Still has some fatigue and malaise issues and somewhat off cognitively but improving.  No focal weakness.  No swallowing difficulties.  Wife has noticed some cognitive slowing and intermittent confusion.  He is also seems somewhat slower on his feet.  He also had complained of some occasional headaches which is unusual for him.  He had repeat CT head which showed no acute findings.  MRI showed no evidence for new stroke.  There was comment of "aging blood products at site of recent right frontal lobe hemorrhage."  He had multiple labs including urinalysis, chemistries, CBC, and coags which showed no acute abnormalities.  He has been taking Keppra for seizures but wife has not noted any recent seizures.  No other change of medications.  No recent fever.  No chest pains.  He did quit smoking about 3 weeks ago and also quit drinking alcohol after drinking several ounces per day of bourbon and we explained that we do not think either of these is tied to his recent confusion.  Is apparently taking multivitamin with thiamine  Past Medical History:  Diagnosis Date  . Allergic rhinitis   . Anticoagulant long-term use    eliquis  . Cardiomyopathy due to systemic disease ([place])    followed by  dr harding  . COPD with emphysema [facility])    pulmologist-  dr Halford Chessman  . Dyspnea    occasional per pt  . History of colon polyps    tubular adenoma 2013  . History of gout    09-05-2017 last flare-up  05/ 2019 3 wks ago, feet  . History of sepsis 02/18/2017   per d/c note probable uti, acute chf, acute renal failure, hypoxia  . Hyperlipidemia   . Hyperplasia of prostate with lower  urinary tract symptoms (LUTS)   . Hypertension   . OSA on CPAP    per study 08-03-2004  Severe OSA  . Persistent atrial fibrillation    cardiologist --  dr Dorris Carnes--  post cardioversion 05-07-2014  . Prostate cancer [facility]) urologsit-  dr Alyson Ingles--- as of 05-21-2017 per pt last PSA 11 approx.   Dx  2014--  stage T1c, Gleason 3+3=6, PSA 6.67--  Active surveillance/  04/ 2019  Stage T1b, Gleason 3+4, PSA 12.8- plan external radiation therapy    . 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 Seasonal allergies   . Sigmoid diverticulosis   . Systolic and diastolic CHF, chronic [facility])    cardiologist-  [name]  . Tinea versicolor   . Type 2 diabetes mellitus ([employer])   . Wears hearing aid in both ears    Past Surgical History:  Procedure Laterality Date  . CARDIOVERSION N/A 05/07/2014   Procedure: CARDIOVERSION;  Surgeon: [name], MD;  Location[facility] ENDOSCOPY;  Service: Cardiovascular;  Laterality: N/A;  . CARDIOVERSION N/A 09/09/2014   Procedure: CARDIOVERSION;  Surgeon: Lelon Perla, MD;  Location: [facility] ENDOSCOPY;  Service: Cardiovascular;  Laterality: N/A;  successfully  . CATARACT EXTRACTION W/ INTRAOCULAR LENS  IMPLANT, BILATERAL  08 and 09/  2018  . COLONOSCOPY  last one 06-07-2011  . GOLD SEED IMPLANT N/A 09/09/2017   Procedure: GOLD SEED IMPLANT;  Surgeon: [name], MD;  Location: [facility];  Service: Urology;  Laterality: N/A;  . HYDROCELE EXCISION Bilateral 09/26/2015   Procedure: HYDROCELECTOMY ADULT;  Surgeon: [name], MD;  Location: [facility];  Service: Urology;  Laterality: Bilateral;  . LAMINECTOMY AND MICRODISCECTOMY LUMBAR SPINE  12-23-2003   Left L5 -- S1  . LAPAROSCOPIC BILATERAL INGUINAL HERNIA REPAIR/  UMBILICAL HERNIA REPAIR WITH MESH/  ASPIRATION LEFT HYDROCELE  07-11-2012  . NM MYOVIEW LTD  05/18/2014   Low risk study. Normal perfusion: No ischemia or  infarction. Mild LV dysfunction - 46% (does not correlate with echocardiographic EF of 50-55%)  . PROSTATE BIOPSY  05/15/12   Clinically both Lobes  . SPACE OAR INSTILLATION N/A 09/09/2017   Procedure: SPACE OAR INSTILLATION;  Surgeon: [name], MD;  [facility];  Service: Urology;  Laterality: N/A;  . TEE WITHOUT CARDIOVERSION N/A 05/07/2014   Procedure: TRANSESOPHAGEAL ECHOCARDIOGRAM (TEE);  Surgeon: [name], MD;  Location: [facility] ENDOSCOPY;  Service: Cardiovascular;  Laterality: N/A;   mild atherosclerosis plaque of aorta,  mild AR, MR, and TR,  no cardiac source of emboli  . TONSILLECTOMY  as child  . TRANSTHORACIC ECHOCARDIOGRAM  11/'18; 1/'19   a) In setting of sepsis: EF of 40-45%.  Diffuse hypokinesis.  No RWMA.  Biatrial enlargement.;; b) ** f/u Jan 2019: Normal LVF 55-60%** , mildly dilated aortic root(38 mm) and ascending aorta (44 mm). Compared to prior echo, LVEF has improved.  . TRANSURETHRAL RESECTION  OF PROSTATE N/A 05/30/2017   Procedure: TRANSURETHRAL RESECTION OF THE PROSTATE (TURP);  Surgeon: [name], MD;  Location: [facility];  Service: Urology;  Laterality: N/A;    reports that he has been smoking cigarettes. He has a 15.00 pack-year smoking history. He has never used smokeless tobacco. He reports current alcohol use of about 14.0 standard drinks of alcohol per week. He reports that he does not use drugs. family history includes Breast cancer in his mother; Ovarian cancer in his sister; Stroke in an other family member. No Known Allergies    Review of Systems  Constitutional: Negative for chills and fever.  Respiratory: Negative for cough and shortness of breath.   Cardiovascular: Negative for chest pain and leg swelling.  Gastrointestinal: Negative for abdominal distention, diarrhea, nausea and vomiting.  Genitourinary: Negative for dysuria.  Neurological: Negative for seizures, syncope, speech difficulty and headaches.   Psychiatric/Behavioral: Positive for confusion. Negative for agitation.       Objective:   Physical Exam Constitutional:      Appearance: Normal appearance.  Neck:     Musculoskeletal: Neck supple.  Cardiovascular:     Rate and Rhythm: Normal rate and regular rhythm.  Pulmonary:     Effort: Pulmonary effort is normal.     Breath sounds: Normal breath sounds.  Musculoskeletal:     Right lower leg: No edema.     Left lower leg: No edema.  Neurological:     General: No focal deficit present.     Mental Status: He is alert.     Cranial Nerves: No cranial nerve deficit.     Motor: No weakness.        Assessment:     Intermittent confusion following recent hemorrhagic CVA.  Suspect related to his recent neurologic insult.  He has not had any fever, hypoxia, medication change, electrolyte disturbance, etc. to explain confusion.  Recent brain imaging and lab work reassuring.    Plan:     -Keep follow-up with neurology as scheduled -No driving -Follow-up immediately for any worsening confusion or any new symptoms  [name] MD [facility]

## 2018-11-14 NOTE — Patient Instructions (Signed)
Confusion °Confusion is the inability to think with the usual speed or clarity. People who are confused often describe their thinking as cloudy or unclear. Confusion can also include feeling disoriented. This means you are unaware of where you are or who you are. You may also not know the date or time. When confused, you may have difficulty remembering, paying attention, or making decisions. Some people also act aggressively when they are confused. °In some cases, confusion may come on quickly. In other cases, it may develop slowly over time. How quickly confusion comes on depends on the cause. °Confusion may be caused by: °· Head injury (concussion). °· Seizures. °· Stroke. °· Fever. °· Brain tumor. °· Decrease in brain function due to a vascular or neurologic condition (dementia). °· Emotions, like rage or terror. °· Inability to know what is real and what is not (hallucinations). °· Infections, such as a urinary tract infection (UTI). °· Using too much alcohol, drugs, or medicines. °· Loss of fluid (dehydration) or an imbalance of salts in the body (electrolytes). °· Lack of sleep. °· Low blood sugar (diabetes). °· Low levels of oxygen. This comes from conditions such as chronic lung disorders. °· Side effects of medicines, or taking medicines that affect other medicines (drug interactions). °· Lack of certain nutrients, especially niacin, thiamine, vitamin C, or vitamin B. °· Sudden drop in body temperature (hypothermia). °· Change in routine, such as traveling or being hospitalized. °Follow these instructions at home: °Pay attention to your symptoms. Tell your health care provider about any changes or if you develop new symptoms. Follow these instructions to control or treat symptoms. Ask a family member or friend for help if needed. °Medicines °· Take over-the-counter and prescription medicines only as told by your health care provider. °· Ask your health care provider about changing or stopping any medicines  that may be causing your confusion. °· Avoid pain medicines or sleep medicines until you have fully recovered. °· Use a pillbox or an alarm to help you take the right medicines at the right time. °Lifestyle ° °· Eat a balanced diet that includes fruits and vegetables. °· Get enough sleep. For most adults, this is 7-9 hours each night. °· Do not drink alcohol. °· Do not become isolated. Spend time with other people and make plans for your days. °· Do not drive until your health care provider says that it is safe to do so. °· Do not use any products that contain nicotine or tobacco, such as cigarettes and e-cigarettes. If you need help quitting, ask your health care provider. °· Stop other activities that may increase your chances of getting hurt. These may include some work duties, sports activities, swimming, or bike riding. Ask your health care provider what activities are safe for you. °What caregivers can do °· Find out if the person is confused. Ask the person to state his or her name, age, and the date. If the person is unsure or answers incorrectly, he or she may be confused. °· Always introduce yourself, no matter how well the person knows you. °· Remind the person of his or her location. Do this often. °· Place a calendar and clock near the person who is confused. °· Talk about current events and plans for the day. °· Keep the environment calm, quiet, and peaceful. °· Help the person do the things that he or she is unable to do. These include: °? Taking medicines. °? Keeping follow-up visits with his or her health care   provider. °? Helping with household duties, including meal preparation. °? Running errands. °· Get help if you need it. There are several support groups for caregivers. °· If the person you are helping needs more support, consider day care, extended care programs, or a skilled nursing facility. The person's health care provider may be able to help evaluate these options. °General  instructions °· Monitor yourself for any conditions you may have. These may include: °? Checking your blood glucose levels, if you have diabetes. °? Watching your weight, if you are overweight. °? Monitoring your blood pressure, if you have hypertension. °? Monitoring your body temperature, if you have a fever. °· Keep all follow-up visits as told by your health care provider. This is important. °Contact a health care provider if: °· Your symptoms get worse. °Get help right away if you: °· Feel that you are not able to care for yourself. °· Develop severe headaches, repeated vomiting, seizures, blackouts, or slurred speech. °· Have increasing confusion, weakness, numbness, restlessness, or personality changes. °· Develop a loss of balance, have marked dizziness, feel uncoordinated, or fall. °· Develop severe anxiety, or you have delusions or hallucinations. °These symptoms may represent a serious problem that is an emergency. Do not wait to see if the symptoms will go away. Get medical help right away. Call your local emergency services (911 in the [place] Do not drive yourself to the hospital. °Summary °· Confusion is the inability to think with the usual speed or clarity. People who are confused often describe their thinking as cloudy or unclear. °· Confusion can also include having difficulty remembering, paying attention, or making decisions. °· Confusion may come on quickly or develop slowly over time, depending on the cause. There are many different causes of confusion. °· Ask for help from family members or friends if you are unable to take care of yourself. °This information is not intended to replace advice given to you by your health care provider. Make sure you discuss any questions you have with your health care provider. °Document Released: 05/03/2004 Document Revised: 03/28/2017 Document Reviewed: 03/28/2017 °[employer] Patient Education © 2020 [employer] ° °

## 2018-11-14 NOTE — ED Provider Notes (Signed)
Care assumed from Dr. Alvino Chapel, patient with altered mental status which has resolved, pending MRI scan to look for evidence of new stroke.  MRI shows no evidence of stroke.  Patient is discharged with instructions to follow-up with PCP.  Results for orders placed or performed during the hospital encounter of 11/13/18  Protime-INR  Result Value Ref Range   Prothrombin Time 13.7 11.4 - 15.2 seconds   INR 1.1 0.8 - 1.2  APTT  Result Value Ref Range   aPTT 25 24 - 36 seconds  CBC  Result Value Ref Range   WBC 7.2 4.0 - 10.5 K/uL   RBC 3.99 (L) 4.22 - 5.81 MIL/uL   Hemoglobin 13.4 13.0 - 17.0 g/dL   HCT 40.5 39.0 - 52.0 %   MCV 101.5 (H) 80.0 - 100.0 fL   MCH 33.6 26.0 - 34.0 pg   MCHC 33.1 30.0 - 36.0 g/dL   RDW 12.1 11.5 - 15.5 %   Platelets 183 150 - 400 K/uL   nRBC 0.0 0.0 - 0.2 %  Differential  Result Value Ref Range   Neutrophils Relative % 74 %   Neutro Abs 5.3 1.7 - 7.7 K/uL   Lymphocytes Relative 15 %   Lymphs Abs 1.1 0.7 - 4.0 K/uL   Monocytes Relative 9 %   Monocytes Absolute 0.7 0.1 - 1.0 K/uL   Eosinophils Relative 1 %   Eosinophils Absolute 0.1 0.0 - 0.5 K/uL   Basophils Relative 0 %   Basophils Absolute 0.0 0.0 - 0.1 K/uL   Immature Granulocytes 1 %   Abs Immature Granulocytes 0.04 0.00 - 0.07 K/uL  Comprehensive metabolic panel  Result Value Ref Range   Sodium 136 135 - 145 mmol/L   Potassium 4.2 3.5 - 5.1 mmol/L   Chloride 106 98 - 111 mmol/L   CO2 19 (L) 22 - 32 mmol/L   Glucose, Bld 95 70 - 99 mg/dL   BUN 11 8 - 23 mg/dL   Creatinine, Ser 1.36 (H) 0.61 - 1.24 mg/dL   Calcium 8.5 (L) 8.9 - 10.3 mg/dL   Total Protein 5.8 (L) 6.5 - 8.1 g/dL   Albumin 3.2 (L) 3.5 - 5.0 g/dL   AST 13 (L) 15 - 41 U/L   ALT 17 0 - 44 U/L   Alkaline Phosphatase 74 38 - 126 U/L   Total Bilirubin 0.7 0.3 - 1.2 mg/dL   GFR calc non Af Amer 51 (L) >60 mL/min   GFR calc Af Amer 59 (L) >60 mL/min   Anion gap 11 5 - 15  Urinalysis, Routine w reflex microscopic  Result Value  Ref Range   Color, Urine STRAW (A) YELLOW   APPearance CLEAR CLEAR   Specific Gravity, Urine 1.003 (L) 1.005 - 1.030   pH 5.0 5.0 - 8.0   Glucose, UA NEGATIVE NEGATIVE mg/dL   Hgb urine dipstick NEGATIVE NEGATIVE   Bilirubin Urine NEGATIVE NEGATIVE   Ketones, ur NEGATIVE NEGATIVE mg/dL   Protein, ur NEGATIVE NEGATIVE mg/dL   Nitrite NEGATIVE NEGATIVE   Leukocytes,Ua NEGATIVE NEGATIVE  I-stat chem 8, ED  Result Value Ref Range   Sodium 136 135 - 145 mmol/L   Potassium 4.2 3.5 - 5.1 mmol/L   Chloride 104 98 - 111 mmol/L   BUN 12 8 - 23 mg/dL   Creatinine, Ser 1.30 (H) 0.61 - 1.24 mg/dL   Glucose, Bld 92 70 - 99 mg/dL   Calcium, Ion 1.13 (L) 1.15 - 1.40 mmol/L   TCO2 21 (  L) 22 - 32 mmol/L   Hemoglobin 13.3 13.0 - 17.0 g/dL   HCT 39.0 39.0 - 52.0 %   Ct Angio Head W Or Wo Contrast  Result Date: 10/21/2018 CLINICAL DATA:  Focal neuro deficit, > 6 hrs, stroke suspected. Seizure. On anticoagulation with right frontal parenchymal hemorrhage. EXAM: CT ANGIOGRAPHY HEAD AND NECK CT VENOGRAM HEAD TECHNIQUE: Multidetector CT imaging of the head and neck was performed using the standard protocol during bolus administration of intravenous contrast. Multiplanar CT image reconstructions and MIPs were obtained to evaluate the vascular anatomy. Carotid stenosis measurements (when applicable) are obtained utilizing NASCET criteria, using the distal internal carotid diameter as the denominator. CONTRAST:  54mL OMNIPAQUE IOHEXOL 350 MG/ML SOLN COMPARISON:  CTA neck 11/30/2015.  CT chest 03/17/2018. FINDINGS: CTA NECK FINDINGS Aortic arch: Partially visualized aneurysmal dilatation of the distal ascending aorta with diameter 4.2 cm, unchanged from the prior chest CT. Standard 3 vessel aortic arch with mild atherosclerotic plaque. No significant arch vessel origin stenosis. Right carotid system: Patent with mild calcified plaque in the proximal ICA. No evidence of dissection or stenosis. Left carotid system:  Patent with mild calcified plaque in the proximal ICA. No evidence of dissection or stenosis. Tortuous proximal common carotid artery. Vertebral arteries: Patent with the right vertebral artery being mildly dominant. Calcified plaque in the right V1 segment results in at most mild stenosis. Skeleton: Moderate disc and facet degeneration in the cervical spine. Other neck: Nasal airway in place. No evidence of cervical lymphadenopathy or mass. Upper chest: Motion artifact in the lung apices. Review of the MIP images confirms the above findings CTA HEAD FINDINGS Anterior circulation: The internal carotid arteries are patent from skull base to carotid termini with mild-to-moderate calcified plaque bilaterally which does not result in significant stenosis. ACAs and MCAs are patent without evidence of proximal branch occlusion or significant proximal stenosis. No aneurysm or vascular malformation is identified with special attention to the region of the right frontal hemorrhage. Posterior circulation: The intracranial vertebral arteries are patent to the basilar with atherosclerotic plaque resulting in mild stenosis bilaterally. Patent PICA and SCA origins are seen bilaterally. The basilar artery is widely patent. Posterior communicating arteries are small or absent. PCAs are patent with mild atherosclerotic irregularity but no flow limiting proximal stenosis. No aneurysm is identified. Venous sinuses: More fully evaluated below. Anatomic variants: None. CT VENOGRAM FINDINGS The superior sagittal sinus, internal cerebral veins, vein of Galen, straight sinus, transverse sinuses, sigmoid sinuses, and jugular bulbs are patent without evidence of thrombus. Cortical veins are grossly symmetric. Review of the MIP images confirms the above findings IMPRESSION: 1. Mild intracranial atherosclerosis without large vessel occlusion, flow limiting proximal stenosis, or aneurysm. 2. Widely patent cervical carotid arteries. 3. Patent  vertebral arteries with mild proximal stenosis on the right. 4. Negative CT venogram. These results were called by telephone at the time of interpretation on 10/21/2018 at 5:58 pm to Dr. Rory Percy, who verbally acknowledged these results. Electronically Signed   By: Logan Bores M.D.   On: 10/21/2018 19:04   Ct Head Wo Contrast  Result Date: 11/13/2018 CLINICAL DATA:  Follow-up hemorrhagic infarct. Increased confusion, weakness and lethargy. EXAM: CT HEAD WITHOUT CONTRAST TECHNIQUE: Contiguous axial images were obtained from the base of the skull through the vertex without intravenous contrast. COMPARISON:  Head CT 10/23/2018 FINDINGS: Brain: Evolutionary changes in the right ACA hemorrhagic infarct. The hematoma is much smaller now measuring a maximum of 2 cm and previously measuring 4 cm. There  is also been contraction of the area of infarct with some residual encephalomalacia. No findings to suggest a hree bleed or new stroke. The ventricles are in the midline. Stable mild mass effect on the frontal horn on the right side. No extra-axial fluid collections. Brainstem and cerebellum appear normal and stable. Vascular: Stable advanced vascular calcifications but no definite aneurysm or hyperdense vessels. Skull: No skull fractures or bone lesions Sinuses/Orbits: . the paranasal sinuses and mastoid air cells are clear. The globes are intact. Other: No scalp lesions or hematoma. IMPRESSION: 1. Expected evolutionary changes in the right ACA territory infarct with contraction of the hematoma and expected encephalomalacia. 2. No new/acute intracranial findings. Electronically Signed   By: Marijo Sanes M.D.   On: 11/13/2018 18:48   Ct Head Wo Contrast  Result Date: 10/23/2018 CLINICAL DATA:  73 year old male code stroke presentation on 10/21/2018 with anterior right frontal lobe hemorrhage. Negative CTA head and neck and CT venogram at presentation. EXAM: CT HEAD WITHOUT CONTRAST TECHNIQUE: Contiguous axial images were  obtained from the base of the skull through the vertex without intravenous contrast. COMPARISON:  10/21/2018. FINDINGS: Brain: 54 x 26 x 39 millimeter (AP by transverse by CC) intra-axial hemorrhage in the medial right frontal lobe, estimated blood volume 27 milliliters, stable since 10/21/2018. Surrounding edema and mild regional mass effect is stable with mild focal leftward midline shift mostly anterior to the septum pellucidum. Trace superimposed subarachnoid hemorrhage suspected and stable. No intraventricular extension identified. No ventriculomegaly. Basilar cisterns remain patent. Stable gray-white matter differentiation throughout the brain. No new cortically based infarct. No new intracranial hemorrhage identified. Vascular: Calcified atherosclerosis at the skull base. No suspicious intracranial vascular hyperdensity. Skull: No acute osseous abnormality identified. Sinuses/Orbits: Visualized paranasal sinuses and mastoids are stable and well pneumatized. Other: Left nasoenteric tube has been removed. No acute orbit or scalp soft tissue findings. IMPRESSION: 1. Stable medial right frontal lobe intra-axial and trace subarachnoid hemorrhage since 10/21/2018. Stable surrounding edema and mild regional mass effect. 2. No new intracranial abnormality. Electronically Signed   By: Genevie Ann M.D.   On: 10/23/2018 19:57   Ct Angio Neck W Or Wo Contrast  Result Date: 10/21/2018 CLINICAL DATA:  Focal neuro deficit, > 6 hrs, stroke suspected. Seizure. On anticoagulation with right frontal parenchymal hemorrhage. EXAM: CT ANGIOGRAPHY HEAD AND NECK CT VENOGRAM HEAD TECHNIQUE: Multidetector CT imaging of the head and neck was performed using the standard protocol during bolus administration of intravenous contrast. Multiplanar CT image reconstructions and MIPs were obtained to evaluate the vascular anatomy. Carotid stenosis measurements (when applicable) are obtained utilizing NASCET criteria, using the distal internal  carotid diameter as the denominator. CONTRAST:  14mL OMNIPAQUE IOHEXOL 350 MG/ML SOLN COMPARISON:  CTA neck 11/30/2015.  CT chest 03/17/2018. FINDINGS: CTA NECK FINDINGS Aortic arch: Partially visualized aneurysmal dilatation of the distal ascending aorta with diameter 4.2 cm, unchanged from the prior chest CT. Standard 3 vessel aortic arch with mild atherosclerotic plaque. No significant arch vessel origin stenosis. Right carotid system: Patent with mild calcified plaque in the proximal ICA. No evidence of dissection or stenosis. Left carotid system: Patent with mild calcified plaque in the proximal ICA. No evidence of dissection or stenosis. Tortuous proximal common carotid artery. Vertebral arteries: Patent with the right vertebral artery being mildly dominant. Calcified plaque in the right V1 segment results in at most mild stenosis. Skeleton: Moderate disc and facet degeneration in the cervical spine. Other neck: Nasal airway in place. No evidence of cervical lymphadenopathy  or mass. Upper chest: Motion artifact in the lung apices. Review of the MIP images confirms the above findings CTA HEAD FINDINGS Anterior circulation: The internal carotid arteries are patent from skull base to carotid termini with mild-to-moderate calcified plaque bilaterally which does not result in significant stenosis. ACAs and MCAs are patent without evidence of proximal branch occlusion or significant proximal stenosis. No aneurysm or vascular malformation is identified with special attention to the region of the right frontal hemorrhage. Posterior circulation: The intracranial vertebral arteries are patent to the basilar with atherosclerotic plaque resulting in mild stenosis bilaterally. Patent PICA and SCA origins are seen bilaterally. The basilar artery is widely patent. Posterior communicating arteries are small or absent. PCAs are patent with mild atherosclerotic irregularity but no flow limiting proximal stenosis. No aneurysm is  identified. Venous sinuses: More fully evaluated below. Anatomic variants: None. CT VENOGRAM FINDINGS The superior sagittal sinus, internal cerebral veins, vein of Galen, straight sinus, transverse sinuses, sigmoid sinuses, and jugular bulbs are patent without evidence of thrombus. Cortical veins are grossly symmetric. Review of the MIP images confirms the above findings IMPRESSION: 1. Mild intracranial atherosclerosis without large vessel occlusion, flow limiting proximal stenosis, or aneurysm. 2. Widely patent cervical carotid arteries. 3. Patent vertebral arteries with mild proximal stenosis on the right. 4. Negative CT venogram. These results were called by telephone at the time of interpretation on 10/21/2018 at 5:58 pm to Dr. Rory Percy, who verbally acknowledged these results. Electronically Signed   By: Logan Bores M.D.   On: 10/21/2018 19:04   Mr Brain Wo Contrast  Result Date: 11/14/2018 CLINICAL DATA:  Altered mental status. History of hemorrhagic stroke 3 weeks ago. EXAM: MRI HEAD WITHOUT CONTRAST TECHNIQUE: Multiplanar, multiecho pulse sequences of the brain and surrounding structures were obtained without intravenous contrast. COMPARISON:  Brain MRI 10/22/2018 and head CT same day FINDINGS: Brain: There aging blood products at the site of previously demonstrated right frontal lobe intraparenchymal hemorrhage. No acute hemorrhage. There is minimal adjacent edema, improved from the prior study. Leftward bulging of the right cingulate gyrus is unchanged. There is no acute ischemia. Vascular: Normal flow voids. Skull and upper cervical spine: Normal marrow signal. Sinuses/Orbits: Negative. Other: None. IMPRESSION: 1. No acute intracranial abnormality. 2. Aging blood products at the site of recent right frontal lobe hemorrhage. Electronically Signed   By: Ulyses Jarred M.D.   On: 11/14/2018 00:34   Mr Jeri Cos XN Contrast  Result Date: 10/22/2018 CLINICAL DATA:  73 y/o  M; intracranial hemorrhage EXAM: MRI  HEAD WITHOUT AND WITH CONTRAST TECHNIQUE: Multiplanar, multiecho pulse sequences of the brain and surrounding structures were obtained without and with intravenous contrast. CONTRAST:  10 cc Gadavist COMPARISON:  10/21/2018 CT head and CTA head. FINDINGS: Brain: Stable acute hemorrhage in the right parafalcine frontal lobe measuring 5.5 cm with intermediate T1 and predominant hyperintense T2 signal. Small surrounding area of vasogenic edema and local mass effect partial effacement of the frontal horn of right lateral ventricle and minimal left-to-right anterior midline shift. Mild chronic microvascular ischemic changes and volume loss of the brain. No hydrocephalus or herniation. No extra-axial collection identified. After administration of intravenous contrast there is no abnormal enhancement. Vascular: Normal flow voids. Skull and upper cervical spine: Normal marrow signal. Sinuses/Orbits: Negative.  Bilateral intra-ocular lens replacement. Other: None. IMPRESSION: 1. Stable acute hemorrhage within right parafalcine frontal lobe as well as associated edema and local mass effect. No abnormal enhancement to suggest underlying mass or vascular malformation. 2. Mild chronic  microvascular ischemic changes and volume loss of the brain. Electronically Signed   By: Kristine Garbe M.D.   On: 10/22/2018 01:26   Ct Venogram Head  Result Date: 10/21/2018 CLINICAL DATA:  Focal neuro deficit, > 6 hrs, stroke suspected. Seizure. On anticoagulation with right frontal parenchymal hemorrhage. EXAM: CT ANGIOGRAPHY HEAD AND NECK CT VENOGRAM HEAD TECHNIQUE: Multidetector CT imaging of the head and neck was performed using the standard protocol during bolus administration of intravenous contrast. Multiplanar CT image reconstructions and MIPs were obtained to evaluate the vascular anatomy. Carotid stenosis measurements (when applicable) are obtained utilizing NASCET criteria, using the distal internal carotid diameter as  the denominator. CONTRAST:  69mL OMNIPAQUE IOHEXOL 350 MG/ML SOLN COMPARISON:  CTA neck 11/30/2015.  CT chest 03/17/2018. FINDINGS: CTA NECK FINDINGS Aortic arch: Partially visualized aneurysmal dilatation of the distal ascending aorta with diameter 4.2 cm, unchanged from the prior chest CT. Standard 3 vessel aortic arch with mild atherosclerotic plaque. No significant arch vessel origin stenosis. Right carotid system: Patent with mild calcified plaque in the proximal ICA. No evidence of dissection or stenosis. Left carotid system: Patent with mild calcified plaque in the proximal ICA. No evidence of dissection or stenosis. Tortuous proximal common carotid artery. Vertebral arteries: Patent with the right vertebral artery being mildly dominant. Calcified plaque in the right V1 segment results in at most mild stenosis. Skeleton: Moderate disc and facet degeneration in the cervical spine. Other neck: Nasal airway in place. No evidence of cervical lymphadenopathy or mass. Upper chest: Motion artifact in the lung apices. Review of the MIP images confirms the above findings CTA HEAD FINDINGS Anterior circulation: The internal carotid arteries are patent from skull base to carotid termini with mild-to-moderate calcified plaque bilaterally which does not result in significant stenosis. ACAs and MCAs are patent without evidence of proximal branch occlusion or significant proximal stenosis. No aneurysm or vascular malformation is identified with special attention to the region of the right frontal hemorrhage. Posterior circulation: The intracranial vertebral arteries are patent to the basilar with atherosclerotic plaque resulting in mild stenosis bilaterally. Patent PICA and SCA origins are seen bilaterally. The basilar artery is widely patent. Posterior communicating arteries are small or absent. PCAs are patent with mild atherosclerotic irregularity but no flow limiting proximal stenosis. No aneurysm is identified. Venous  sinuses: More fully evaluated below. Anatomic variants: None. CT VENOGRAM FINDINGS The superior sagittal sinus, internal cerebral veins, vein of Galen, straight sinus, transverse sinuses, sigmoid sinuses, and jugular bulbs are patent without evidence of thrombus. Cortical veins are grossly symmetric. Review of the MIP images confirms the above findings IMPRESSION: 1. Mild intracranial atherosclerosis without large vessel occlusion, flow limiting proximal stenosis, or aneurysm. 2. Widely patent cervical carotid arteries. 3. Patent vertebral arteries with mild proximal stenosis on the right. 4. Negative CT venogram. These results were called by telephone at the time of interpretation on 10/21/2018 at 5:58 pm to Dr. Rory Percy, who verbally acknowledged these results. Electronically Signed   By: Logan Bores M.D.   On: 10/21/2018 19:04   Ct Head Code Stroke Wo Contrast  Result Date: 10/21/2018 CLINICAL DATA:  Code stroke. Focal neuro deficit, < 6 hrs, stroke suspected. Seizure. On anticoagulation. EXAM: CT HEAD WITHOUT CONTRAST TECHNIQUE: Contiguous axial images were obtained from the base of the skull through the vertex without intravenous contrast. COMPARISON:  11/30/2015 FINDINGS: Brain: An acute parenchymal hemorrhage in the anteromedial right frontal lobe measures 2.3 x 3.2 x 5.6 cm (approximate volume of 21 mL). There  is mild surrounding edema with mass effect on the right frontal horn and localized leftward midline shift of 6 mm. Small volume subarachnoid hemorrhage is noted in the frontal region on both sides of the falx as well as at the posterior aspect of the right sylvian fissure. No acute infarct is identified separate from the hemorrhage. Mild cerebral atrophy is within normal limits for age. Vascular: Calcified atherosclerosis at the skull base. No hyperdense vessel. Skull: No fracture or focal osseous lesion. Sinuses/Orbits: Paranasal sinuses and mastoid air cells are clear. Bilateral cataract extraction.  Other: None. ASPECTS [facility] Stroke Program Early CT Score) Not scored due to the presence of acute hemorrhage. IMPRESSION: 1. 5.6 cm right frontal lobe parenchymal hemorrhage with mild edema and 6 mm of local leftward midline shift. 2. Small volume subarachnoid hemorrhage. Critical Value/emergent results were called by telephone at the time of interpretation on 10/21/2018 at 5:58 pm to Dr. Rory Percy, who verbally acknowledged these results. Electronically Signed   By: Logan Bores M.D.   On: 03/50/0938 18:29      Delora Fuel, MD 93/71/69 [phone]

## 2018-11-19 ENCOUNTER — Ambulatory Visit: Payer: Medicare Other | Admitting: Occupational Therapy

## 2018-11-19 ENCOUNTER — Encounter: Payer: Self-pay | Admitting: Occupational Therapy

## 2018-11-19 ENCOUNTER — Ambulatory Visit: Payer: Medicare Other | Admitting: Physical Therapy

## 2018-11-19 ENCOUNTER — Other Ambulatory Visit: Payer: Self-pay

## 2018-11-19 DIAGNOSIS — M6281 Muscle weakness (generalized): Secondary | ICD-10-CM | POA: Diagnosis not present

## 2018-11-19 DIAGNOSIS — R41842 Visuospatial deficit: Secondary | ICD-10-CM | POA: Diagnosis not present

## 2018-11-19 DIAGNOSIS — R41844 Frontal lobe and executive function deficit: Secondary | ICD-10-CM

## 2018-11-19 DIAGNOSIS — I69118 Other symptoms and signs involving cognitive functions following nontraumatic intracerebral hemorrhage: Secondary | ICD-10-CM

## 2018-11-19 DIAGNOSIS — R2689 Other abnormalities of gait and mobility: Secondary | ICD-10-CM

## 2018-11-19 DIAGNOSIS — R471 Dysarthria and anarthria: Secondary | ICD-10-CM | POA: Diagnosis not present

## 2018-11-19 NOTE — Therapy (Signed)
[facility] [address] [place], [place], [address] Phone: [phone]   Fax:  [phone]  Physical Therapy Treatment  Patient Details  Name: Ronald [name] MRN: [id number] Date of Birth: May 21, 1945 Referring Provider (PT): Rosalin Hawking, MD   Encounter Date: 11/19/2018  PT End of Session - 11/19/18 1502    Visit Number  2    Number of Visits  4    Date for PT Re-Evaluation  12/08/18    Authorization Type  MCR    PT Start Time  1020    PT Stop Time  1105    PT Time Calculation (min)  45 min    Activity Tolerance  Patient tolerated treatment well    Behavior During Therapy  [facility] for tasks assessed/performed       Past Medical History:  Diagnosis Date  . Allergic rhinitis   . Anticoagulant long-term use    eliquis  . Cardiomyopathy due to systemic disease [facility])    followed by dr harding  . COPD with emphysema [facility])    pulmologist-  dr Halford Chessman  . Dyspnea    occasional per pt  . History of colon polyps    tubular adenoma 2013  . History of gout    09-05-2017 last flare-up  05/ 2019 3 wks ago, feet  . History of sepsis 02/18/2017   per d/c note probable uti, acute chf, acute renal failure, hypoxia  . Hyperlipidemia   . Hyperplasia of prostate with lower urinary tract symptoms (LUTS)   . Hypertension   . OSA on CPAP    per study 08-03-2004  Severe OSA  . Persistent atrial fibrillation    cardiologist --  dr Dorris Carnes--  post cardioversion 05-07-2014  . Prostate cancer [facility]) urologsit-  dr Alyson Ingles--- as of 05-21-2017 per pt last PSA 11 approx.   Dx  2014--  stage T1c, Gleason 3+3=6, PSA 6.67--  Active surveillance/  04/ 2019  Stage T1b, Gleason 3+4, PSA 12.8- plan external radiation therapy    . 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 ([place])    pulmologist-  dr Halford Chessman  . Seasonal allergies   . Sigmoid diverticulosis   . Systolic and diastolic CHF, chronic [facility])    cardiologist-  dr  Ellyn Hack  . Tinea versicolor   . Type 2 diabetes mellitus ([place])   . Wears hearing aid in both ears     Past Surgical History:  Procedure Laterality Date  . CARDIOVERSION N/A 05/07/2014   Procedure: CARDIOVERSION;  Surgeon: [name], MD;  Location[facility] END[facility]COPY;  Service: Cardiovascular;  Laterality: N/A;  . CARDIOVERSION N/A 09/09/2014   Procedure: CARDIOVERSION;  Surgeon: Lelon Perla, MD;  Location: [facility] ENDOSCOPY;  Service: Cardiovascular;  Laterality: N/A;  successfully  . CATARACT EXTRACTION W/ INTRAOCULAR LENS  IMPLANT, BILATERAL  08 and 09/  2018  . COLONOSCOPY  last one 06-07-2011  . GOLD SEED IMPLANT N/A 09/09/2017   Procedure: GOLD SEED IMPLANT;  Surgeon: [name], MD;  Location: [facility];  Service: Urology;  Laterality: N/A;  . HYDROCELE EXCISION Bilateral 09/26/2015   Procedure: HYDROCELECTOMY ADULT;  Surgeon: [name], MD;  Location: [facility];  Service: Urology;  Laterality: Bilateral;  . LAMINECTOMY AND MICRODISCECTOMY LUMBAR SPINE  12-23-2003   Left L5 -- S1  . LAPAROSCOPIC BILATERAL INGUINAL HERNIA REPAIR/  UMBILICAL HERNIA REPAIR WITH MESH/  ASPIRATION LEFT HYDROCELE  07-11-2012  . NM MYOVIEW LTD  05/18/2014  Low risk study. Normal perfusion: No ischemia or infarction. Mild LV dysfunction - 46% (does not correlate with echocardiographic EF of 50-55%)  . PROSTATE BIOPSY  05/15/12   Clinically both Lobes  . SPACE OAR INSTILLATION N/A 09/09/2017   Procedure: SPACE OAR INSTILLATION;  Surgeon: [name], MD;  Location: [facility];  Service: Urology;  Laterality: N/A;  . TEE WITHOUT CARDIOVERSION N/A 05/07/2014   Procedure: TRANSESOPHAGEAL ECHOCARDIOGRAM (TEE);  Surgeon: [name], MD;  Location: [facility] ENDOSCOPY;  Service: Cardiovascular;  Laterality: N/A;   mild atherosclerosis plaque of aorta,  mild AR, MR, and TR,  no cardiac source of emboli  . TONSILLECTOMY  as child  . TRANSTHORACIC  ECHOCARDIOGRAM  11/'18; 1/'19   a) In setting of sepsis: EF of 40-45%.  Diffuse hypokinesis.  No RWMA.  Biatrial enlargement.;; b) ** f/u Jan 2019: Normal LVF 55-60%** , mildly dilated aortic root(38 mm) and ascending aorta (44 mm). Compared to prior echo, LVEF has improved.  . TRANSURETHRAL RESECTION OF PROSTATE N/A 05/30/2017   Procedure: TRANSURETHRAL RESECTION OF THE PROSTATE (TURP);  Surgeon: [name], MD;  [facility];  Service: Urology;  Laterality: N/A;    There were no vitals filed for this visit.  Subjective Assessment - 11/19/18 1500    Subjective  Relays he has had some decline in balance, LE strength, and cognition. Has not done his HEP yet    Patient is accompained by:  Family member   wife   Currently in Pain?  No/denies        Therex and neuro rehab performed today:   dynamic balance in // bars side stepping over hurldes and marching over hurdles, tandem walk, retro walk, sidestepping on foam.   Then static balance on uneven surface at counter Airex pad balance for feet together, feet together with head turns, and feet apart with eyes closed  Strength: step ups Lt leg without UE support onto 6 inch step at stair case,  X15 fwd and X 15 lateral, Standing marches X 15 bilat  Blood pressure was 143/90 pre session    PT Education - 11/19/18 1501    Education Details  importance of performing exercise and staying active    Person(s) Educated  Patient;Spouse    Methods  Explanation    Comprehension  Verbalized understanding          PT Long Term Goals - 11/10/18 1655      PT LONG TERM GOAL #1   Title  Pt will be I and compliant with HEP. (Target goal for all goals 4 weeks 12/08/18)    Status  New      PT LONG TERM GOAL #2   Title  Pt will improve FGA to at least 27 to show improved balance    Baseline  26    Status  New      PT LONG TERM GOAL #3   Title  Pt will improve BERG to at least 51 to show improved balance    Baseline  49     Status  New      PT LONG TERM GOAL #4   Title  Pt will improve 6MWT by 50 ft to show improved endurance    Status  New            Plan - 11/19/18 1503    Clinical Impression Statement  Pt has regressed some since eval in his leg strength, balance and cognition. He had increased  difficulty following commands and was more distracted today. He was highly encouraged to work more at home with his leg strenght and balance HEP.    Personal Factors and Comorbidities  Comorbidity 1;Comorbidity 2;Comorbidity 3+    Comorbidities  history of persistent atrial fibrillation, COPD with emphysema, hypertension, obstructive sleep apnea, hyperlipidemia, prostatic hyperplasia, colonic polyps, tobacco abuse, chronic congestive systolic and diastolic heart failure    Examination-Activity Limitations  Bend;Locomotion Level;Stairs;Stand;Lift    Examination-Participation Restrictions  Community Activity;Driving    Stability/Clinical Decision Making  Evolving/Moderate complexity    Rehab Potential  Excellent    PT Frequency  1x / week    PT Duration  4 weeks    PT Treatment/Interventions  Gait training;Stair training;Therapeutic activities;Therapeutic exercise;Balance training;Neuromuscular re-education;Manual techniques    PT Next Visit Plan  review HEP, progress endurance and balance as tolerated, monitor BP    PT Home Exercise Plan  Access Code: [email or web address]    Consulted and Agree with Plan of Care  Patient       Patient will benefit from skilled therapeutic intervention in order to improve the following deficits and impairments:  Cardiopulmonary status limiting activity, Decreased activity tolerance, Decreased balance, Decreased endurance, Decreased strength, Difficulty walking  Visit Diagnosis: 1. Muscle weakness (generalized)   2. Other abnormalities of gait and mobility        Problem List Patient Active Problem List   Diagnosis Date Noted  . Seizures ([place]), secondsry to [facility] 10/23/2018  .  Coagulopathy ([place]), Eliquis 10/23/2018  . Cerebral edema ([place]) 10/23/2018  . Hypertensive emergency 10/23/2018  . Advanced age 74/16/2020  . ICH (intracerebral hemorrhage) ([place]) w/ SAH while on Eliquis 10/21/2018  . Hepatic steatosis 04/22/2018  . Actinic keratoses 06/07/2017  . BPH (benign prostatic hyperplasia) 05/30/2017  . Cardiomyopathy due to systemic disease ([place]) 03/09/2017  . Preop cardiovascular exam 03/07/2017  . Hypervolemia   . Chronic combined systolic and diastolic CHF (congestive heart failure) ([place])   . Hypoxia 02/21/2017  . ARF (acute renal failure) ([place])   . Hypomagnesemia 02/20/2017  . GI bleed 02/20/2017  . Severe sepsis ([place]) 02/19/2017  . History of colonic polyps 07/03/2016  . Chronic anticoagulation 07/03/2016  . Hyperglycemia, drug-induced 04/05/2015  . 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 On amiodarone therapy 12/08/2014  . Edema of both legs 12/08/2014  . Exertional dyspnea 08/18/2014  . Hypokalemia   . Tobacco abuse   . Paroxysmal atrial fibrillation ([place]) 05/04/2014  . Cigarette smoker two packs a day or less   . Prostate cancer ([place]) 10/15/2013  . Obesity (BMI 30-39.9) 04/17/2013  . Umbilical hernia 54/12/8117  . Right inguinal hernia 06/23/2012  . Hydrocele 06/19/2012  . Metabolic syndrome 14/78/2956  . Type 2 diabetes mellitus with hyperglycemia ([place]) 03/20/2012  . Elevated PSA 03/20/2012  . GOUT, UNSPECIFIED 10/07/2009  . TINEA VERSICOLOR 07/19/2009  . PERS HX TOBACCO USE PRESENTING HAZARDS HEALTH 07/19/2009  . Obstructive sleep apnea 07/02/2008  . Hyperlipidemia with target LDL less than 70 07/01/2008  . Essential hypertension 07/01/2008  . ALLERGIC RHINITIS 07/01/2008    Silvestre Mesi 11/19/2018, 3:06 PM  [facility] [address] [place], [place], [address] Phone: [phone]   Fax:  [phone]  Name: Ronald [name] MRN: [id number] Date of Birth: 1945/12/24

## 2018-11-19 NOTE — Patient Instructions (Signed)
    Memory Compensation Strategies  1. Use "WARM" strategy. W= write it down A=  associate it R=  repeat it M=  make a mental picture  2. You can keep a Social worker. Use a 3-ring notebook with sections for the following:  calendar, important names and phone numbers, medications, doctors' names/phone numbers, "to do list"/reminders, and a section to journal what you did each day  3. Use a calendar to write appointments down.  4. Write yourself a schedule for the day/make a routine This can be placed on the calendar or in a separate section of the Memory Notebook.  Keeping a regular schedule can help memory.  5. Use medication organizer with sections for each day or morning/evening pills  You may need help loading it  6. Keep a basket, or pegboard by the door.   Place items that you need to take out with you in the basket or on the pegboard.  You may also want to include a message board for reminders.  7. Use sticky notes. Place sticky notes with reminders in a place where the task is performed.  For example:  "turn off the stove" placed by the stove, "lock the door" placed on the door at eye level, "take your medications" on the bathroom mirror or by the place where you normally take your medications  8. Use alarms/timers.  Use while cooking to remind yourself to check on food or as a reminder to take your medicine, or as a reminder to make a call, or as a reminder to perform another task, etc.  9. Use a voice memo to record important information and notes for yourself.  10.  Organize and reduce clutter     Activities for Cognition:  1. Jigsaw puzzles 2. Card/board games 3. Talking on the phone/social events 4. Lumosity.com or constanttherapy.com 5. Online games 6. Word serches/crossword puzzles/Sudoku 7. Make a list of tasks to complete when doing a project/running errands and check things off as you go 8.  Plan out meals for the week and write a grocery list

## 2018-11-19 NOTE — Therapy (Signed)
[facility] [address] [place], [place], [address] Phone: [phone]   Fax:  [phone]  Occupational Therapy Treatment  Patient Details  Name: Ronald [name] MRN: [id number] Date of Birth: 11/11/1945 No data recorded  Encounter Date: 11/19/2018  OT End of Session - 11/19/18 1112    Visit Number  2    Number of Visits  16    Authorization Type  BC/BS Fed Emp primary, ACS MCR secondary    Authorization - Visit Number  2    Authorization - Number of Visits  10    OT Start Time  1106    OT Stop Time  1145    OT Time Calculation (min)  39 min    Activity Tolerance  Patient tolerated treatment well    Behavior During Therapy  Flat affect       Past Medical History:  Diagnosis Date  . Allergic rhinitis   . Anticoagulant long-term use    eliquis  . Cardiomyopathy due to systemic disease [facility])    followed by dr harding  . COPD with emphysema [facility])    pulmologist-  dr Halford Chessman  . Dyspnea    occasional per pt  . History of colon polyps    tubular adenoma 2013  . History of gout    09-05-2017 last flare-up  05/ 2019 3 wks ago, feet  . History of sepsis 02/18/2017   per d/c note probable uti, acute chf, acute renal failure, hypoxia  . Hyperlipidemia   . Hyperplasia of prostate with lower urinary tract symptoms (LUTS)   . Hypertension   . OSA on CPAP    per study 08-03-2004  Severe OSA  . Persistent atrial fibrillation    cardiologist --  dr Dorris Carnes--  post cardioversion 05-07-2014  . Prostate cancer [facility]) urologsit-  dr Alyson Ingles--- as of 05-21-2017 per pt last PSA 11 approx.   Dx  2014--  stage T1c, Gleason 3+3=6, PSA 6.67--  Active surveillance/  04/ 2019  Stage T1b, Gleason 3+4, PSA 12.8- plan external radiation therapy    . 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 Seasonal allergies   . Sigmoid diverticulosis   . Systolic and diastolic CHF, chronic  [facility])    cardiologist-  [name]  . Tinea versicolor   . Type 2 diabetes mellitus ([place])   . Wears hearing aid in both ears     Past Surgical History:  Procedure Laterality Date  . CARDIOVERSION N/A 05/07/2014   Procedure: CARDIOVERSION;  Surgeon: [name], MD;  Location: [facility] ENDOSCOPY;  Service: Cardiovascular;  Laterality: N/A;  . CARDIOVERSION N/A 09/09/2014   Procedure: CARDIOVERSION;  Surgeon: Lelon Perla, MD;  Location: [facility] ENDOSCOPY;  Service: Cardiovascular;  Laterality: N/A;  successfully  . CATARACT EXTRACTION W/ INTRAOCULAR LENS  IMPLANT, BILATERAL  08 and 09/  2018  . COLONOSCOPY  last one 06-07-2011  . GOLD SEED IMPLANT N/A 09/09/2017   Procedure: GOLD SEED IMPLANT;  Surgeon: [name], MD;  Location: [facility];  Service: Urology;  Laterality: N/A;  . HYDROCELE EXCISION Bilateral 09/26/2015   Procedure: HYDROCELECTOMY ADULT;  Surgeon: [name], MD;  Location: [facility];  Service: Urology;  Laterality: Bilateral;  . LAMINECTOMY AND MICRODISCECTOMY LUMBAR SPINE  12-23-2003   Left L5 -- S1  . LAPAROSCOPIC BILATERAL INGUINAL HERNIA REPAIR/  UMBILICAL HERNIA REPAIR WITH MESH/  ASPIRATION LEFT HYDROCELE  07-11-2012  . NM MYOVIEW LTD  05/18/2014   Low risk study. Normal perfusion: No ischemia or infarction. Mild LV dysfunction - 46% (does not correlate with echocardiographic EF of 50-55%)  . PROSTATE BIOPSY  05/15/12   Clinically both Lobes  . SPACE OAR INSTILLATION N/A 09/09/2017   Procedure: SPACE OAR INSTILLATION;  Surgeon: [name], MD;  Location[facility];  Service: Urology;  Laterality: N/A;  . TEE WITHOUT CARDIOVERSION N/A 05/07/2014   Procedure: TRANSESOPHAGEAL ECHOCARDIOGRAM (TEE);  Surgeon: [name], MD;  Location: [facility] ENDOSCOPY;  Service: Cardiovascular;  Laterality: N/A;   mild atherosclerosis plaque of aorta,  mild AR, MR, and TR,  no cardiac source of emboli  . TONSILLECTOMY  as child   . TRANSTHORACIC ECHOCARDIOGRAM  11/'18; 1/'19   a) In setting of sepsis: EF of 40-45%.  Diffuse hypokinesis.  No RWMA.  Biatrial enlargement.;; b) ** f/u Jan 2019: Normal LVF 55-60%** , mildly dilated aortic root(38 mm) and ascending aorta (44 mm). Compared to prior echo, LVEF has improved.  . TRANSURETHRAL RESECTION OF PROSTATE N/A 05/30/2017   Procedure: TRANSURETHRAL RESECTION OF THE PROSTATE (TURP);  Surgeon: [name], MD;  [facility];  Service: Urology;  Laterality: N/A;    There were no vitals filed for this visit.  Subjective Assessment - 11/19/18 1016    Subjective   Wife reports that pt went to hospital and PCP last week, but no new changes.  Wife reports that he is still having confusion with lightswitch and poured graded cheese on strawberries with whipped cream.  Wife also reports confusion with oven/microwave.   Wife reports confusion in the evening.    Patient is accompanied by:  Family member   wife [name])   Pertinent History  nontraumatic cortical hemmorhage Rt cerebral hemisphere 10/21/18. PMH: A-fib, COPD w/ emphysema, HTN, HLD, CHF, sleep apnea, seizure    Limitations  no driving, no heavy lifting    Currently in Pain?  No/denies          Completed MOCA:  Pt needed incr time and repetition.  Pt with score of 19/30 (normal is greater than or equal to 26/30).  Discussed results with pt/wife.  Pt demo incr difficulty with visuospatial/executive functioning sections (4/5), immediate and delayed recall (0/5), language (1/3), and abstraction (1/2).  Pt scored well with attention section but was noted to be distracted and needed redirection during assessment.                   OT Education - 11/19/18 1203    Education Details  Importance of balance of rest/activity after CVA, how fatigue can affect cognition, results of MOCA (difficulty with memory, attention, and orientation).  Cognitive compensation strategies and cognitive activities for home.     Person(s) Educated  Patient;Spouse    Methods  Explanation;Handout    Comprehension  Verbalized understanding       OT Short Term Goals - 11/13/18 1302      OT SHORT TERM GOAL #1   Title  Pt/family to verbalize understanding with memory strategies    Time  4    Period  Weeks    Status  New      OT SHORT TERM GOAL #2   Title  Pt to perform simple money exchange and addition/subraction problems in prep for financial management    Time  4    Period  Weeks    Status  New  OT SHORT TERM GOAL #3   Title  Pt to perform basic problem solving skills to anticipate potential problems and able to id steps needed in hypothetical safety situations    Time  4    Period  Weeks    Status  New      OT SHORT TERM GOAL #4   Title  Pt to be able to take meds after dispensed in pillbox w/ external aids/cues (alarm) and 1 questioning cue only prn    Time  4    Period  Weeks    Status  New        OT Long Term Goals - 11/13/18 1307      OT LONG TERM GOAL #1   Title  Pt able to perform basic financial management tasks w/ min errors only in prep for working on investments w/ supervision    Time  8    Period  Weeks    Status  New      OT LONG TERM GOAL #2   Title  Pt able to cook simple familiar meal demo safety w/ distant supervision only    Time  8    Period  Weeks    Status  New      OT LONG TERM GOAL #3   Title  Pt to be independent with medication management w/ established routine and external aid    Time  8    Period  Weeks    Status  New      OT LONG TERM GOAL #4   Title  Pt to perform light yardwork tasks for 20 min. or greater w/o rest    Time  8    Period  Weeks    Status  New      OT LONG TERM GOAL #5   Title  Pt to demo ability to perform 8 step sequencing and planning tasks w/ no more than 2 cues    Time  8    Period  Weeks    Status  New            Plan - 11/19/18 1118    Clinical Impression Statement  Pt with memory and attention deficits affecting  functional tasks.  Wife reports incr cognition difficulty/confustion at night.  Began education regarding cognitive compensation strategies and activities for home.  Pt/wife verbalized understanding.    Occupational performance deficits (Please refer to evaluation for details):  IADL's    Body Structure / Function / Physical Skills  Endurance;Mobility;Strength    Cognitive Skills  Memory;Problem Solve;Perception;Sequencing;Safety Awareness;Thought    Rehab Potential  Good    Clinical Decision Making  Several treatment options, min-mod task modification necessary    Comorbidities Affecting Occupational Performance:  May have comorbidities impacting occupational performance   Pt w/ extensive PMH   Modification or Assistance to Complete Evaluation   Min-Moderate modification of tasks or assist with assess necessary to complete eval    OT Frequency  2x / week    OT Duration  8 weeks    OT Treatment/Interventions  Self-care/ADL training;Therapeutic activities;Cognitive remediation/compensation;Patient/family education;Visual/perceptual remediation/compensation;DME and/or AE instruction    Plan  memory strategies, functional cognition    Consulted and Agree with Plan of Care  Patient;Family member/caregiver   Wife [name])      Patient will benefit from skilled therapeutic intervention in order to improve the following deficits and impairments:   Body Structure / Function / Physical Skills: Endurance, Mobility, Strength Cognitive Skills: Memory, Problem  Solve, Perception, Sequencing, Safety Awareness, Thought     Visit Diagnosis: 1. Other symptoms and signs involving cognitive functions following nontraumatic intracerebral hemorrhage   2. Frontal lobe and executive function deficit   3. Visuospatial deficit       Problem List Patient Active Problem List   Diagnosis Date Noted  . Seizures ([place]), secondsry to [facility] 10/23/2018  . Coagulopathy ([place]), Eliquis 10/23/2018  . Cerebral edema ([place])  10/23/2018  . Hypertensive emergency 10/23/2018  . Advanced age 62/16/2020  . ICH (intracerebral hemorrhage) ([place]) w/ SAH while on Eliquis 10/21/2018  . Hepatic steatosis 04/22/2018  . Actinic keratoses 06/07/2017  . BPH (benign prostatic hyperplasia) 05/30/2017  . Cardiomyopathy due to systemic disease ([place]) 03/09/2017  . Preop cardiovascular exam 03/07/2017  . Hypervolemia   . Chronic combined systolic and diastolic CHF (congestive heart failure) ([place])   . Hypoxia 02/21/2017  . ARF (acute renal failure) ([place])   . Hypomagnesemia 02/20/2017  . GI bleed 02/20/2017  . Severe sepsis ([place]) 02/19/2017  . History of colonic polyps 07/03/2016  . Chronic anticoagulation 07/03/2016  . Hyperglycemia, drug-induced 04/05/2015  . 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 On amiodarone therapy 12/08/2014  . Edema of both legs 12/08/2014  . Exertional dyspnea 08/18/2014  . Hypokalemia   . Tobacco abuse   . Paroxysmal atrial fibrillation ([place]) 05/04/2014  . Cigarette smoker two packs a day or less   . Prostate cancer ([place]) 10/15/2013  . Obesity (BMI 30-39.9) 04/17/2013  . Umbilical hernia 62/83/6629  . Right inguinal hernia 06/23/2012  . Hydrocele 06/19/2012  . Metabolic syndrome 47/65/4650  . Type 2 diabetes mellitus with hyperglycemia ([place]) 03/20/2012  . Elevated PSA 03/20/2012  . GOUT, UNSPECIFIED 10/07/2009  . TINEA VERSICOLOR 07/19/2009  . PERS HX TOBACCO USE PRESENTING HAZARDS HEALTH 07/19/2009  . Obstructive sleep apnea 07/02/2008  . Hyperlipidemia with target LDL less than 70 07/01/2008  . Essential hypertension 07/01/2008  . ALLERGIC RHINITIS 07/01/2008    [facility] 11/19/2018, 4:23 PM  [facility] [address] [place], [place], [address] Phone: [phone]   Fax:  [phone]  Name: DEAUNTE DENTE MRN: [id number] Date of Birth: 1945/08/23   Vianne Bulls,  OTR/L [facility] [address] [place], [place]  [address] [phone] phone [phone] 11/19/18 4:23 PM

## 2018-11-21 ENCOUNTER — Encounter (HOSPITAL_COMMUNITY): Payer: Self-pay

## 2018-11-21 ENCOUNTER — Ambulatory Visit: Payer: Self-pay

## 2018-11-21 ENCOUNTER — Inpatient Hospital Stay (HOSPITAL_COMMUNITY)
Admission: EM | Admit: 2018-11-21 | Discharge: 2018-11-27 | DRG: 056 | Disposition: A | Discharge status: Rehabilitation Facility | Payer: Medicare Other | Attending: Internal Medicine | Admitting: Internal Medicine

## 2018-11-21 ENCOUNTER — Other Ambulatory Visit: Payer: Self-pay

## 2018-11-21 ENCOUNTER — Emergency Department (HOSPITAL_COMMUNITY): Payer: Medicare Other

## 2018-11-21 DIAGNOSIS — R296 Repeated falls: Secondary | ICD-10-CM | POA: Diagnosis present

## 2018-11-21 DIAGNOSIS — I10 Essential (primary) hypertension: Secondary | ICD-10-CM | POA: Diagnosis present

## 2018-11-21 DIAGNOSIS — Z781 Physical restraint status: Secondary | ICD-10-CM

## 2018-11-21 DIAGNOSIS — N183 Chronic kidney disease, stage 3 unspecified: Secondary | ICD-10-CM | POA: Diagnosis present

## 2018-11-21 DIAGNOSIS — I5032 Chronic diastolic (congestive) heart failure: Secondary | ICD-10-CM | POA: Diagnosis present

## 2018-11-21 DIAGNOSIS — E785 Hyperlipidemia, unspecified: Secondary | ICD-10-CM | POA: Diagnosis present

## 2018-11-21 DIAGNOSIS — Z03818 Encounter for observation for suspected exposure to other biological agents ruled out: Secondary | ICD-10-CM | POA: Diagnosis not present

## 2018-11-21 DIAGNOSIS — I619 Nontraumatic intracerebral hemorrhage, unspecified: Secondary | ICD-10-CM | POA: Diagnosis present

## 2018-11-21 DIAGNOSIS — I69098 Other sequelae following nontraumatic subarachnoid hemorrhage: Principal | ICD-10-CM

## 2018-11-21 DIAGNOSIS — R569 Unspecified convulsions: Secondary | ICD-10-CM

## 2018-11-21 DIAGNOSIS — N179 Acute kidney failure, unspecified: Secondary | ICD-10-CM | POA: Diagnosis not present

## 2018-11-21 DIAGNOSIS — Z7984 Long term (current) use of oral hypoglycemic drugs: Secondary | ICD-10-CM

## 2018-11-21 DIAGNOSIS — M109 Gout, unspecified: Secondary | ICD-10-CM | POA: Diagnosis present

## 2018-11-21 DIAGNOSIS — Z8546 Personal history of malignant neoplasm of prostate: Secondary | ICD-10-CM

## 2018-11-21 DIAGNOSIS — Y929 Unspecified place or not applicable: Secondary | ICD-10-CM

## 2018-11-21 DIAGNOSIS — F05 Delirium due to known physiological condition: Secondary | ICD-10-CM | POA: Diagnosis not present

## 2018-11-21 DIAGNOSIS — G4733 Obstructive sleep apnea (adult) (pediatric): Secondary | ICD-10-CM | POA: Diagnosis present

## 2018-11-21 DIAGNOSIS — J439 Emphysema, unspecified: Secondary | ICD-10-CM | POA: Diagnosis present

## 2018-11-21 DIAGNOSIS — I712 Thoracic aortic aneurysm, without rupture: Secondary | ICD-10-CM | POA: Diagnosis present

## 2018-11-21 DIAGNOSIS — I130 Hypertensive heart and chronic kidney disease with heart failure and stage 1 through stage 4 chronic kidney disease, or unspecified chronic kidney disease: Secondary | ICD-10-CM | POA: Diagnosis present

## 2018-11-21 DIAGNOSIS — E1165 Type 2 diabetes mellitus with hyperglycemia: Secondary | ICD-10-CM | POA: Diagnosis present

## 2018-11-21 DIAGNOSIS — Z823 Family history of stroke: Secondary | ICD-10-CM

## 2018-11-21 DIAGNOSIS — I7121 Aneurysm of the ascending aorta, without rupture: Secondary | ICD-10-CM | POA: Diagnosis present

## 2018-11-21 DIAGNOSIS — E1122 Type 2 diabetes mellitus with diabetic chronic kidney disease: Secondary | ICD-10-CM | POA: Diagnosis present

## 2018-11-21 DIAGNOSIS — G40909 Epilepsy, unspecified, not intractable, without status epilepticus: Secondary | ICD-10-CM | POA: Diagnosis present

## 2018-11-21 DIAGNOSIS — Z79899 Other long term (current) drug therapy: Secondary | ICD-10-CM

## 2018-11-21 DIAGNOSIS — G9341 Metabolic encephalopathy: Secondary | ICD-10-CM | POA: Diagnosis present

## 2018-11-21 DIAGNOSIS — I480 Paroxysmal atrial fibrillation: Secondary | ICD-10-CM | POA: Diagnosis present

## 2018-11-21 DIAGNOSIS — I4819 Other persistent atrial fibrillation: Secondary | ICD-10-CM | POA: Diagnosis present

## 2018-11-21 DIAGNOSIS — G9340 Encephalopathy, unspecified: Secondary | ICD-10-CM | POA: Diagnosis not present

## 2018-11-21 DIAGNOSIS — Z7982 Long term (current) use of aspirin: Secondary | ICD-10-CM

## 2018-11-21 DIAGNOSIS — F17210 Nicotine dependence, cigarettes, uncomplicated: Secondary | ICD-10-CM | POA: Diagnosis present

## 2018-11-21 DIAGNOSIS — Z9841 Cataract extraction status, right eye: Secondary | ICD-10-CM

## 2018-11-21 DIAGNOSIS — Z9079 Acquired absence of other genital organ(s): Secondary | ICD-10-CM

## 2018-11-21 DIAGNOSIS — Z8719 Personal history of other diseases of the digestive system: Secondary | ICD-10-CM

## 2018-11-21 DIAGNOSIS — R260 Ataxic gait: Secondary | ICD-10-CM | POA: Diagnosis present

## 2018-11-21 DIAGNOSIS — Z803 Family history of malignant neoplasm of breast: Secondary | ICD-10-CM

## 2018-11-21 DIAGNOSIS — R4182 Altered mental status, unspecified: Secondary | ICD-10-CM | POA: Diagnosis present

## 2018-11-21 DIAGNOSIS — T426X5A Adverse effect of other antiepileptic and sedative-hypnotic drugs, initial encounter: Secondary | ICD-10-CM | POA: Diagnosis present

## 2018-11-21 DIAGNOSIS — I429 Cardiomyopathy, unspecified: Secondary | ICD-10-CM | POA: Diagnosis present

## 2018-11-21 DIAGNOSIS — Z961 Presence of intraocular lens: Secondary | ICD-10-CM | POA: Diagnosis present

## 2018-11-21 DIAGNOSIS — Z20828 Contact with and (suspected) exposure to other viral communicable diseases: Secondary | ICD-10-CM | POA: Diagnosis present

## 2018-11-21 DIAGNOSIS — I5042 Chronic combined systolic (congestive) and diastolic (congestive) heart failure: Secondary | ICD-10-CM | POA: Diagnosis present

## 2018-11-21 DIAGNOSIS — Z974 Presence of external hearing-aid: Secondary | ICD-10-CM

## 2018-11-21 DIAGNOSIS — Z8041 Family history of malignant neoplasm of ovary: Secondary | ICD-10-CM

## 2018-11-21 DIAGNOSIS — Z7951 Long term (current) use of inhaled steroids: Secondary | ICD-10-CM

## 2018-11-21 DIAGNOSIS — Z9842 Cataract extraction status, left eye: Secondary | ICD-10-CM

## 2018-11-21 LAB — CBC
HCT: 43.1 % (ref 39.0–52.0)
Hemoglobin: 14.8 g/dL (ref 13.0–17.0)
MCH: 33.6 pg (ref 26.0–34.0)
MCHC: 34.3 g/dL (ref 30.0–36.0)
MCV: 98 fL (ref 80.0–100.0)
Platelets: 207 10*3/uL (ref 150–400)
RBC: 4.4 MIL/uL (ref 4.22–5.81)
RDW: 11.9 % (ref 11.5–15.5)
WBC: 5.2 10*3/uL (ref 4.0–10.5)
nRBC: 0 % (ref 0.0–0.2)

## 2018-11-21 LAB — COMPREHENSIVE METABOLIC PANEL
ALT: 21 U/L (ref 0–44)
AST: 18 U/L (ref 15–41)
Albumin: 3.8 g/dL (ref 3.5–5.0)
Alkaline Phosphatase: 82 U/L (ref 38–126)
Anion gap: 11 (ref 5–15)
BUN: 18 mg/dL (ref 8–23)
CO2: 22 mmol/L (ref 22–32)
Calcium: 9.3 mg/dL (ref 8.9–10.3)
Chloride: 104 mmol/L (ref 98–111)
Creatinine, Ser: 1.62 mg/dL — ABNORMAL HIGH (ref 0.61–1.24)
GFR calc Af Amer: 48 mL/min — ABNORMAL LOW (ref >60)
GFR calc non Af Amer: 41 mL/min — ABNORMAL LOW (ref >60)
Glucose, Bld: 135 mg/dL — ABNORMAL HIGH (ref 70–99)
Potassium: 4.4 mmol/L (ref 3.5–5.1)
Sodium: 137 mmol/L (ref 135–145)
Total Bilirubin: 0.8 mg/dL (ref 0.3–1.2)
Total Protein: 6.5 g/dL (ref 6.5–8.1)

## 2018-11-21 LAB — DIFFERENTIAL
Abs Immature Granulocytes: 0.05 10*3/uL (ref 0.00–0.07)
Basophils Absolute: 0 10*3/uL (ref 0.0–0.1)
Basophils Relative: 1 %
Eosinophils Absolute: 0.1 10*3/uL (ref 0.0–0.5)
Eosinophils Relative: 2 %
Immature Granulocytes: 1 %
Lymphocytes Relative: 22 %
Lymphs Abs: 1.1 10*3/uL (ref 0.7–4.0)
Monocytes Absolute: 0.5 10*3/uL (ref 0.1–1.0)
Monocytes Relative: 10 %
Neutro Abs: 3.4 10*3/uL (ref 1.7–7.7)
Neutrophils Relative %: 64 %

## 2018-11-21 LAB — APTT: aPTT: 24 seconds (ref 24–36)

## 2018-11-21 LAB — PROTIME-INR
INR: 1 (ref 0.8–1.2)
Prothrombin Time: 13.4 seconds (ref 11.4–15.2)

## 2018-11-21 MED ORDER — SODIUM CHLORIDE 0.9% FLUSH: 3.0000 mL | Freq: Once | INTRAVENOUS | Status: DC

## 2018-11-21 NOTE — Telephone Encounter (Signed)
Patient is at ED

## 2018-11-21 NOTE — ED Triage Notes (Signed)
Pt accompanied by wife for increased confusion and memory loss over the past 2 days, hx of prior stroke in July of this year, pt was seen last week for same, MRI done with no changes. Pt alert, oriented to self only, aphasia noted.

## 2018-11-21 NOTE — Telephone Encounter (Signed)
Incoming call from Patients wife reporting that there has been a cognitive decline in her husband. Balance unsteady.    Goes to PT and OT both disciplines state that they see the decline also.  Patient has an Neurologic appt. On Tuesday Wife state that his blood pressure was  156/102.  HR 55 Requested a second value states she just got that value.  Reviewed the protocol recommended he go to ED pr Urgent care.  States She is going to watch his blood Pressure . Call back to wife Encourage wife to take Patient to be evaluated at ED aor Urgent care.  Eife voiced understanding.  Reason for Disposition . [1] MODERATE weakness (i.e., interferes with work, school, normal activities) AND [2] cause unknown  (Exceptions: weakness with acute minor illness, or weakness from poor fluid intake)  Answer Assessment - Initial Assessment Questions 1. SYMPTOM: "What is the main symptom you are concerned about?" (e.g., weakness, numbness)     Declining cognivtivly 2. ONSET: "When did this start?" (minutes, hours, days; while sleeping)      A littl bit last night.  3. LAST NORMAL: "When was the last time you were normal (no symptoms)?"     before the stroke  4. PATTERN "Does this come and go, or has it been constant since it started?"  "Is it present now?"     Been all day 5. CARDIAC SYMPTOMS: "Have you had any of the following symptoms: chest pain, difficulty breathing, palpitations?"     denies 6. NEUROLOGIC SYMPTOMS: "Have you had any of the following symptoms: headache, dizziness, vision loss, double vision, changes in speech, unsteady on your feet?"    balance off  7. OTHER SYMPTOMS: "Do you have any other symptoms?"     Denies 8. PREGNANCY: "Is there any chance you are pregnant?" "When was your last menstrual period?"     na  Protocols used: WEAKNESS (GENERALIZED) AND FATIGUE-A-AH, NEUROLOGIC DEFICIT-A-AH

## 2018-11-21 NOTE — Telephone Encounter (Signed)
Please see message. I will advise to go to ER. Any other suggestions?

## 2018-11-21 NOTE — Telephone Encounter (Signed)
Please advise see note

## 2018-11-21 NOTE — Telephone Encounter (Signed)
I called both patient's and wife's Ronald [name]) phone and got no answer.   Left message to call back.

## 2018-11-22 ENCOUNTER — Observation Stay (HOSPITAL_COMMUNITY): Payer: Medicare Other

## 2018-11-22 ENCOUNTER — Emergency Department (HOSPITAL_COMMUNITY): Payer: Medicare Other

## 2018-11-22 ENCOUNTER — Encounter (HOSPITAL_COMMUNITY): Payer: Self-pay | Admitting: Family Medicine

## 2018-11-22 DIAGNOSIS — I6521 Occlusion and stenosis of right carotid artery: Secondary | ICD-10-CM | POA: Diagnosis not present

## 2018-11-22 DIAGNOSIS — I611 Nontraumatic intracerebral hemorrhage in hemisphere, cortical: Secondary | ICD-10-CM

## 2018-11-22 DIAGNOSIS — I712 Thoracic aortic aneurysm, without rupture: Secondary | ICD-10-CM

## 2018-11-22 DIAGNOSIS — N183 Chronic kidney disease, stage 3 unspecified: Secondary | ICD-10-CM | POA: Diagnosis present

## 2018-11-22 DIAGNOSIS — I480 Paroxysmal atrial fibrillation: Secondary | ICD-10-CM | POA: Diagnosis not present

## 2018-11-22 DIAGNOSIS — R4182 Altered mental status, unspecified: Secondary | ICD-10-CM | POA: Diagnosis not present

## 2018-11-22 DIAGNOSIS — R2689 Other abnormalities of gait and mobility: Secondary | ICD-10-CM

## 2018-11-22 DIAGNOSIS — I5032 Chronic diastolic (congestive) heart failure: Secondary | ICD-10-CM | POA: Diagnosis not present

## 2018-11-22 DIAGNOSIS — E1165 Type 2 diabetes mellitus with hyperglycemia: Secondary | ICD-10-CM

## 2018-11-22 DIAGNOSIS — G9340 Encephalopathy, unspecified: Secondary | ICD-10-CM | POA: Diagnosis present

## 2018-11-22 DIAGNOSIS — G4733 Obstructive sleep apnea (adult) (pediatric): Secondary | ICD-10-CM | POA: Diagnosis not present

## 2018-11-22 DIAGNOSIS — I7121 Aneurysm of the ascending aorta, without rupture: Secondary | ICD-10-CM | POA: Diagnosis present

## 2018-11-22 DIAGNOSIS — I629 Nontraumatic intracranial hemorrhage, unspecified: Secondary | ICD-10-CM | POA: Diagnosis not present

## 2018-11-22 DIAGNOSIS — I10 Essential (primary) hypertension: Secondary | ICD-10-CM

## 2018-11-22 LAB — BASIC METABOLIC PANEL
Anion gap: 11 (ref 5–15)
BUN: 16 mg/dL (ref 8–23)
CO2: 23 mmol/L (ref 22–32)
Calcium: 9.1 mg/dL (ref 8.9–10.3)
Chloride: 106 mmol/L (ref 98–111)
Creatinine, Ser: 1.24 mg/dL (ref 0.61–1.24)
GFR calc Af Amer: >60 mL/min (ref >60)
GFR calc non Af Amer: 57 mL/min — ABNORMAL LOW (ref >60)
Glucose, Bld: 134 mg/dL — ABNORMAL HIGH (ref 70–99)
Potassium: 4.6 mmol/L (ref 3.5–5.1)
Sodium: 140 mmol/L (ref 135–145)

## 2018-11-22 LAB — URINALYSIS, ROUTINE W REFLEX MICROSCOPIC
Bilirubin Urine: NEGATIVE
Glucose, UA: NEGATIVE mg/dL
Hgb urine dipstick: NEGATIVE
Ketones, ur: NEGATIVE mg/dL
Leukocytes,Ua: NEGATIVE
Nitrite: NEGATIVE
Protein, ur: NEGATIVE mg/dL
Specific Gravity, Urine: 1.008 (ref 1.005–1.030)
pH: 5 (ref 5.0–8.0)

## 2018-11-22 LAB — CBC
HCT: 42.5 % (ref 39.0–52.0)
Hemoglobin: 14.2 g/dL (ref 13.0–17.0)
MCH: 32.9 pg (ref 26.0–34.0)
MCHC: 33.4 g/dL (ref 30.0–36.0)
MCV: 98.4 fL (ref 80.0–100.0)
Platelets: 201 10*3/uL (ref 150–400)
RBC: 4.32 MIL/uL (ref 4.22–5.81)
RDW: 11.9 % (ref 11.5–15.5)
WBC: 4.7 10*3/uL (ref 4.0–10.5)
nRBC: 0 % (ref 0.0–0.2)

## 2018-11-22 LAB — C-REACTIVE PROTEIN: CRP: 1.4 mg/dL — ABNORMAL HIGH (ref <1.0)

## 2018-11-22 LAB — GLUCOSE, CAPILLARY: Glucose-Capillary: 173 mg/dL — ABNORMAL HIGH (ref 70–99)

## 2018-11-22 LAB — SARS CORONAVIRUS 2 (TAT 6-24 HRS): SARS Coronavirus 2: NEGATIVE

## 2018-11-22 LAB — CBG MONITORING, ED: Glucose-Capillary: 130 mg/dL — ABNORMAL HIGH (ref 70–99)

## 2018-11-22 LAB — SEDIMENTATION RATE: Sed Rate: 16 mm/hr (ref 0–16)

## 2018-11-22 MED ORDER — SODIUM CHLORIDE 0.9 % IV SOLN
INTRAVENOUS | Status: DC
  Administered 2018-11-22: 04:00:00 via INTRAVENOUS

## 2018-11-22 MED ORDER — HYDROCODONE-ACETAMINOPHEN 5-325 MG PO TABS
1.0000 | ORAL_TABLET | Freq: Four times a day (QID) | ORAL | Status: DC | PRN
  Administered 2018-11-22: 1 via ORAL
  Filled 2018-11-22 (×2): qty 1

## 2018-11-22 MED ORDER — ONDANSETRON HCL 4 MG PO TABS: 4.0000 mg | ORAL_TABLET | Freq: Four times a day (QID) | ORAL | Status: DC | PRN

## 2018-11-22 MED ORDER — LORAZEPAM 2 MG/ML IJ SOLN
1.0000 mg | Freq: Once | INTRAMUSCULAR | Status: AC
  Administered 2018-11-22: 1 mg via INTRAVENOUS
  Filled 2018-11-22: qty 1

## 2018-11-22 MED ORDER — SODIUM CHLORIDE 0.9% FLUSH
3.0000 mL | Freq: Two times a day (BID) | INTRAVENOUS | Status: DC
  Administered 2018-11-22 – 2018-11-26 (×8): 3 mL via INTRAVENOUS

## 2018-11-22 MED ORDER — ACETAMINOPHEN 650 MG RE SUPP: 650.0000 mg | Freq: Four times a day (QID) | RECTAL | Status: DC | PRN

## 2018-11-22 MED ORDER — INSULIN ASPART 100 UNIT/ML ~~LOC~~ SOLN
0.0000 [IU] | Freq: Three times a day (TID) | SUBCUTANEOUS | Status: DC
  Administered 2018-11-22: 1 [IU] via SUBCUTANEOUS
  Administered 2018-11-23 (×2): 2 [IU] via SUBCUTANEOUS
  Administered 2018-11-23: 1 [IU] via SUBCUTANEOUS
  Administered 2018-11-24 – 2018-11-25 (×2): 2 [IU] via SUBCUTANEOUS
  Administered 2018-11-25 – 2018-11-27 (×3): 1 [IU] via SUBCUTANEOUS

## 2018-11-22 MED ORDER — AMIODARONE HCL 200 MG PO TABS
200.0000 mg | ORAL_TABLET | Freq: Every day | ORAL | Status: DC
  Administered 2018-11-23 – 2018-11-27 (×5): 200 mg via ORAL
  Filled 2018-11-22 (×6): qty 1

## 2018-11-22 MED ORDER — LEVETIRACETAM 500 MG PO TABS
500.0000 mg | ORAL_TABLET | Freq: Two times a day (BID) | ORAL | Status: DC
  Administered 2018-11-22: 500 mg via ORAL
  Filled 2018-11-22: qty 1

## 2018-11-22 MED ORDER — ACETAMINOPHEN 325 MG PO TABS: 650.0000 mg | ORAL_TABLET | Freq: Four times a day (QID) | ORAL | Status: DC | PRN

## 2018-11-22 MED ORDER — LABETALOL HCL 5 MG/ML IV SOLN
10.0000 mg | INTRAVENOUS | Status: DC | PRN
  Administered 2018-11-22 (×2): 10 mg via INTRAVENOUS
  Filled 2018-11-22 (×2): qty 4

## 2018-11-22 MED ORDER — MOMETASONE FURO-FORMOTEROL FUM 200-5 MCG/ACT IN AERO
2.0000 | INHALATION_SPRAY | Freq: Two times a day (BID) | RESPIRATORY_TRACT | Status: DC
  Administered 2018-11-22 – 2018-11-27 (×7): 2 via RESPIRATORY_TRACT
  Filled 2018-11-22: qty 8.8

## 2018-11-22 MED ORDER — HYDRALAZINE HCL 20 MG/ML IJ SOLN
10.0000 mg | Freq: Once | INTRAMUSCULAR | Status: AC
  Administered 2018-11-22: 10 mg via INTRAVENOUS
  Filled 2018-11-22: qty 1

## 2018-11-22 MED ORDER — VALPROATE SODIUM 500 MG/5ML IV SOLN
1000.0000 mg | Freq: Once | INTRAVENOUS | Status: AC
  Administered 2018-11-22: 1000 mg via INTRAVENOUS
  Filled 2018-11-22: qty 10

## 2018-11-22 MED ORDER — VALPROATE SODIUM 500 MG/5ML IV SOLN
500.0000 mg | Freq: Two times a day (BID) | INTRAVENOUS | Status: DC
  Administered 2018-11-23 – 2018-11-24 (×4): 500 mg via INTRAVENOUS
  Filled 2018-11-22 (×6): qty 5

## 2018-11-22 MED ORDER — ALBUTEROL SULFATE (2.5 MG/3ML) 0.083% IN NEBU: 3.0000 mL | INHALATION_SOLUTION | Freq: Four times a day (QID) | RESPIRATORY_TRACT | Status: DC | PRN

## 2018-11-22 MED ORDER — POLYETHYLENE GLYCOL 3350 17 G PO PACK: 17.0000 g | PACK | Freq: Every day | ORAL | Status: DC | PRN

## 2018-11-22 MED ORDER — ONDANSETRON HCL 4 MG/2ML IJ SOLN
4.0000 mg | Freq: Four times a day (QID) | INTRAMUSCULAR | Status: DC | PRN
  Administered 2018-11-22 – 2018-11-23 (×2): 4 mg via INTRAVENOUS
  Filled 2018-11-22 (×2): qty 2

## 2018-11-22 MED ORDER — INSULIN ASPART 100 UNIT/ML ~~LOC~~ SOLN: 0.0000 [IU] | Freq: Every day | SUBCUTANEOUS | Status: DC

## 2018-11-22 MED ORDER — IOHEXOL 350 MG/ML SOLN
100.0000 mL | Freq: Once | INTRAVENOUS | Status: AC | PRN
  Administered 2018-11-22: 100 mL via INTRAVENOUS

## 2018-11-22 MED ORDER — SODIUM CHLORIDE 0.9 % IV SOLN
INTRAVENOUS | Status: DC
  Administered 2018-11-22 – 2018-11-24 (×2): via INTRAVENOUS

## 2018-11-22 MED ORDER — SODIUM CHLORIDE 0.9 % IV BOLUS (SEPSIS)
1000.0000 mL | Freq: Once | INTRAVENOUS | Status: AC
  Administered 2018-11-22: 1000 mL via INTRAVENOUS

## 2018-11-22 MED ORDER — ATORVASTATIN CALCIUM 10 MG PO TABS
10.0000 mg | ORAL_TABLET | Freq: Every day | ORAL | Status: DC
  Administered 2018-11-22 – 2018-11-26 (×5): 10 mg via ORAL
  Filled 2018-11-22 (×5): qty 1

## 2018-11-22 MED ORDER — CARVEDILOL 12.5 MG PO TABS
12.5000 mg | ORAL_TABLET | Freq: Two times a day (BID) | ORAL | Status: DC
  Administered 2018-11-22 – 2018-11-27 (×12): 12.5 mg via ORAL
  Filled 2018-11-22 (×12): qty 1

## 2018-11-22 NOTE — ED Notes (Signed)
Attempted report X2 

## 2018-11-22 NOTE — ED Notes (Signed)
Patient transported to CT 

## 2018-11-22 NOTE — Progress Notes (Signed)
Pt has been sleeping since he returned from MRI around 2000.   Initial assessment done.   Pt woke up around 2100 and attempted getting out of bed. Reoriented and redirected and was able to follow command. Due medication given  SBP greater than 190. IV labetalol 10 mg  given   Pt refused dinner  Vanilla pudding given pt tolerated well and went back to sleep. BP repeated and remained high   CNA reported to RN that  pt vomited around 2315, BP 190/107  Labetalol 10 mg repeated and Zofran 4 mg IV also given, Will monitor pt & repeat BP and  It remained high will notify medical personnel on call.

## 2018-11-22 NOTE — ED Notes (Signed)
ED TO INPATIENT HANDOFF REPORT  ED Nurse Name and Phone #: [phone]  S Name/Age/Gender Ronald [name] 73 y.o. male Room/Bed: 034C/034C  Code Status   Code Status: Full Code  Home/SNF/Other Home Patient oriented to: self and place Is this baseline? Yes   Triage Complete: Triage complete  Chief Complaint Loss of balance, memory loss  Triage Note Pt accompanied by wife for increased confusion and memory loss over the past 2 days, hx of prior stroke in July of this year, pt was seen last week for same, MRI done with no changes. Pt alert, oriented to self only, aphasia noted.    Allergies No Known Allergies  Level of Care/Admitting Diagnosis ED Disposition    ED Disposition Condition [facility] Area: [place] [[id number]]  Level of Care: Telemetry Medical [104]  I expect the patient will be discharged within 24 hours: Yes  LOW acuity---Tx typically complete <24 hrs---ACUTE conditions typically can be evaluated <24 hours---LABS likely to return to acceptable levels <24 hours---IS near functional baseline---EXPECTED to return to current living arrangement---NOT newly hypoxic: Does not meet criteria for 5C-Observation unit  Covid Evaluation: Asymptomatic Screening Protocol (No Symptoms)  Diagnosis: Acute encephalopathy [[id number]]  Admitting Physician: Vianne Bulls [[id number]]  Attending Physician: Vianne Bulls [[id number]]  PT Class (Do Not Modify): Observation [104]  PT Acc Code (Do Not Modify): Observation [10022]       B Medical/Surgery History Past Medical History:  Diagnosis Date  . Allergic rhinitis   . Anticoagulant long-term use    eliquis  . Cardiomyopathy due to systemic disease [facility])    followed by dr harding  . COPD with emphysema [facility])    pulmologist-  dr Halford Chessman  . Dyspnea    occasional per pt  . History of colon polyps    tubular adenoma 2013  . History of gout    09-05-2017 last flare-up  05/ 2019 3 wks ago, feet  .  History of sepsis 02/18/2017   per d/c note probable uti, acute chf, acute renal failure, hypoxia  . Hyperlipidemia   . Hyperplasia of prostate with lower urinary tract symptoms (LUTS)   . Hypertension   . OSA on CPAP    per study 08-03-2004  Severe OSA  . Persistent atrial fibrillation    cardiologist --  dr Dorris Carnes--  post cardioversion 05-07-2014  . Prostate cancer [facility]) urologsit-  dr Alyson Ingles--- as of 05-21-2017 per pt last PSA 11 approx.   Dx  2014--  stage T1c, Gleason 3+3=6, PSA 6.67--  Active surveillance/  04/ 2019  Stage T1b, Gleason 3+4, PSA 12.8- plan external radiation therapy    . 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 Seasonal allergies   . Sigmoid diverticulosis   . Systolic and diastolic CHF, chronic [facility])    cardiologist-  [name]  . Tinea versicolor   . Type 2 diabetes mellitus ([place])   . Wears hearing aid in both ears    Past Surgical History:  Procedure Laterality Date  . CARDIOVERSION N/A 05/07/2014   Procedure: CARDIOVERSION;  Surgeon: [name], MD;  Location: [facility] ENDOSCOPY;  Service: Cardiovascular;  Laterality: N/A;  . CARDIOVERSION N/A 09/09/2014   Procedure: CARDIOVERSION;  Surgeon: Lelon Perla, MD;  Location: [facility] ENDOSCOPY;  Service: Cardiovascular;  Laterality: N/A;  successfully  . CATARACT EXTRACTION W/ INTRAOCULAR LENS  IMPLANT, BILATERAL  08 and 09/  2018  .  COLONOSCOPY  last one 06-07-2011  . GOLD SEED IMPLANT N/A 09/09/2017   Procedure: GOLD SEED IMPLANT;  Surgeon: [name], MD;  Location: [facility];  Service: Urology;  Laterality: N/A;  . HYDROCELE EXCISION Bilateral 09/26/2015   Procedure: HYDROCELECTOMY ADULT;  Surgeon: [name], MD;  Location: [facility];  Service: Urology;  Laterality: Bilateral;  . LAMINECTOMY AND MICRODISCECTOMY LUMBAR SPINE  12-23-2003   Left L5 -- S1  . LAPAROSCOPIC BILATERAL INGUINAL HERNIA REPAIR/   UMBILICAL HERNIA REPAIR WITH MESH/  ASPIRATION LEFT HYDROCELE  07-11-2012  . NM MYOVIEW LTD  05/18/2014   Low risk study. Normal perfusion: No ischemia or infarction. Mild LV dysfunction - 46% (does not correlate with echocardiographic EF of 50-55%)  . PROSTATE BIOPSY  05/15/12   Clinically both [place]  . SPACE OAR INSTILLATION N/A 09/09/2017   Procedure: SPACE OAR INSTILLATION;  Surgeon: [name], MD;  Location: [facility];  Service: Urology;  Laterality: N/A;  . TEE WITHOUT CARDIOVERSION N/A 05/07/2014   Procedure: TRANSESOPHAGEAL ECHOCARDIOGRAM (TEE);  Surgeon: [name], MD;  Location: [facility] ENDOSCOPY;  Service: Cardiovascular;  Laterality: N/A;   mild atherosclerosis plaque of aorta,  mild AR, MR, and TR,  no cardiac source of emboli  . TONSILLECTOMY  as child  . TRANSTHORACIC ECHOCARDIOGRAM  11/'18; 1/'19   a) In setting of sepsis: EF of 40-45%.  Diffuse hypokinesis.  No RWMA.  Biatrial enlargement.;; b) ** f/u Jan 2019: Normal LVF 55-60%** , mildly dilated aortic root(38 mm) and ascending aorta (44 mm). Compared to prior echo, LVEF has improved.  . TRANSURETHRAL RESECTION OF PROSTATE N/A 05/30/2017   Procedure: TRANSURETHRAL RESECTION OF THE PROSTATE (TURP);  Surgeon: [name], MD;  Location: [facility];  Service: Urology;  Laterality: N/A;     A IV Location/Drains/Wounds Patient Lines/Drains/Airways Status   Active Line/Drains/Airways    Name:   Placement date:   Placement time:   Site:   Days:   Peripheral IV 11/13/18 Right Antecubital   11/13/18    2107    Antecubital   9   Peripheral IV 11/22/18 Right Antecubital   11/22/18    0151    Antecubital   less than 1   Incision (Closed) 09/09/17 Perineum   09/09/17    1528     439          Intake/Output Last 24 hours  Intake/Output Summary (Last 24 hours) at 11/22/2018 1654 Last data filed at 11/22/2018 0272 Gross per 24 hour  Intake 1000 ml  Output -  Net 1000 ml    Labs/Imaging Results for orders  placed or performed during the hospital encounter of 11/21/18 (from the past 48 hour(s))  Protime-INR     Status: None   Collection Time: 11/21/18  4:44 PM  Result Value Ref Range   Prothrombin Time 13.4 11.4 - 15.2 seconds   INR 1.0 0.8 - 1.2    Comment: (NOTE) INR goal varies based on device and disease states. Performed at [facility], [place] [address], [place], [place] [address]   APTT     Status: None   Collection Time: 11/21/18  4:44 PM  Result Value Ref Range   aPTT 24 24 - 36 seconds    Comment: Performed at [facility] [address] [place], [place] [address]  CBC     Status: None   Collection Time: 11/21/18  4:44 PM  Result  Value Ref Range   WBC 5.2 4.0 - 10.5 K/uL   RBC 4.40 4.22 - 5.81 MIL/uL   Hemoglobin 14.8 13.0 - 17.0 g/dL   HCT 43.1 39.0 - 52.0 %   MCV 98.0 80.0 - 100.0 fL   MCH 33.6 26.0 - 34.0 pg   MCHC 34.3 30.0 - 36.0 g/dL   RDW 11.9 11.5 - 15.5 %   Platelets 207 150 - 400 K/uL   nRBC 0.0 0.0 - 0.2 %    Comment: Performed at [facility], [place] [address], [place], [place] [address]  Differential     Status: None   Collection Time: 11/21/18  4:44 PM  Result Value Ref Range   Neutrophils Relative % 64 %   Neutro Abs 3.4 1.7 - 7.7 K/uL   Lymphocytes Relative 22 %   Lymphs Abs 1.1 0.7 - 4.0 K/uL   Monocytes Relative 10 %   Monocytes Absolute 0.5 0.1 - 1.0 K/uL   Eosinophils Relative 2 %   Eosinophils Absolute 0.1 0.0 - 0.5 K/uL   Basophils Relative 1 %   Basophils Absolute 0.0 0.0 - 0.1 K/uL   Immature Granulocytes 1 %   Abs Immature Granulocytes 0.05 0.00 - 0.07 K/uL    Comment: Performed at [facility] [address] [place], [place] [address]  Comprehensive metabolic panel     Status: Abnormal   Collection Time: 11/21/18  4:44 PM  Result Value Ref Range   Sodium 137 135 - 145 mmol/L   Potassium 4.4 3.5 - 5.1 mmol/L   Chloride 104 98 - 111 mmol/L   CO2 22 22 - 32 mmol/L   Glucose, Bld 135 (H) 70 - 99 mg/dL   BUN 18  8 - 23 mg/dL   Creatinine, Ser 1.62 (H) 0.61 - 1.24 mg/dL   Calcium 9.3 8.9 - 10.3 mg/dL   Total Protein 6.5 6.5 - 8.1 g/dL   Albumin 3.8 3.5 - 5.0 g/dL   AST 18 15 - 41 U/L   ALT 21 0 - 44 U/L   Alkaline Phosphatase 82 38 - 126 U/L   Total Bilirubin 0.8 0.3 - 1.2 mg/dL   GFR calc non Af Amer 41 (L) >60 mL/min   GFR calc Af Amer 48 (L) >60 mL/min   Anion gap 11 5 - 15    Comment: Performed at [facility] [address], [place], [place] [address]  Urinalysis, Routine w reflex microscopic     Status: Abnormal   Collection Time: 11/22/18  3:07 AM  Result Value Ref Range   Color, Urine STRAW (A) YELLOW   APPearance CLEAR CLEAR   Specific Gravity, Urine 1.008 1.005 - 1.030   pH 5.0 5.0 - 8.0   Glucose, UA NEGATIVE NEGATIVE mg/dL   Hgb urine dipstick NEGATIVE NEGATIVE   Bilirubin Urine NEGATIVE NEGATIVE   Ketones, ur NEGATIVE NEGATIVE mg/dL   Protein, ur NEGATIVE NEGATIVE mg/dL   Nitrite NEGATIVE NEGATIVE   Leukocytes,Ua NEGATIVE NEGATIVE    Comment: Performed at [facility] [address], [place], [place] [address]  C-reactive protein     Status: Abnormal   Collection Time: 11/22/18  4:05 AM  Result Value Ref Range   CRP 1.4 (H) <1.0 mg/dL    Comment: Performed at [facility] [address], [place], [place] [address]  Sedimentation rate     Status: None   Collection Time: 11/22/18  4:05 AM  Result Value Ref  Range   Sed Rate 16 0 - 16 mm/hr    Comment: Performed at [facility] [address] [place], [place] [address]  SARS CORONAVIRUS 2 Nasal Swab Aptima Multi Swab     Status: None   Collection Time: 11/22/18  4:58 AM   Specimen: Aptima Multi Swab; Nasal Swab  Result Value Ref Range   SARS Coronavirus 2 NEGATIVE NEGATIVE    Comment: (NOTE) SARS-CoV-2 target nucleic acids are NOT DETECTED. The SARS-CoV-2 RNA is generally detectable in upper and lower respiratory specimens during the acute phase of infection. Negative results do not preclude  SARS-CoV-2 infection, do not rule out co-infections with other pathogens, and should not be used as the sole basis for treatment or other patient management decisions. Negative results must be combined with clinical observations, patient history, and epidemiological information. The expected result is Negative. Fact Sheet for Patients: SugarRoll.be Fact Sheet for Healthcare Providers: https://www.woods-mathews.com/ This test is not yet approved or cleared by the [place] FDA and  has been authorized for detection and/or diagnosis of SARS-CoV-2 by FDA under an Emergency Use Authorization (EUA). This EUA will remain  in effect (meaning this test can be used) for the duration of the COVID-19 declaration under Section 56 4(b)(1) of the Act, 21 U.S.C. section 360bbb-3(b)(1), unless the authorization is terminated or revoked sooner. Performed at [facility], [place] [address], [place], [place] [address]   CBG monitoring, ED     Status: Abnormal   Collection Time: 11/22/18 11:27 AM  Result Value Ref Range   Glucose-Capillary 130 (H) 70 - 99 mg/dL  Basic metabolic panel     Status: Abnormal   Collection Time: 11/22/18  1:32 PM  Result Value Ref Range   Sodium 140 135 - 145 mmol/L   Potassium 4.6 3.5 - 5.1 mmol/L   Chloride 106 98 - 111 mmol/L   CO2 23 22 - 32 mmol/L   Glucose, Bld 134 (H) 70 - 99 mg/dL   BUN 16 8 - 23 mg/dL   Creatinine, Ser 1.24 0.61 - 1.24 mg/dL   Calcium 9.1 8.9 - 10.3 mg/dL   GFR calc non Af Amer 57 (L) >60 mL/min   GFR calc Af Amer >60 >60 mL/min   Anion gap 11 5 - 15    Comment: Performed at [facility], [place] [address], [place] [address]  CBC     Status: None   Collection Time: 11/22/18  1:32 PM  Result Value Ref Range   WBC 4.7 4.0 - 10.5 K/uL   RBC 4.32 4.22 - 5.81 MIL/uL   Hemoglobin 14.2 13.0 - 17.0 g/dL   HCT 42.5 39.0 - 52.0 %   MCV 98.4 80.0 - 100.0 fL   MCH 32.9 26.0 - 34.0 pg    MCHC 33.4 30.0 - 36.0 g/dL   RDW 11.9 11.5 - 15.5 %   Platelets 201 150 - 400 K/uL   nRBC 0.0 0.0 - 0.2 %    Comment: Performed at [facility], [place] [address] [place],  [address]   Ct Angio Head W Or Wo Contrast  Result Date: 11/22/2018 CLINICAL DATA:  Possible vasculitis. EXAM: CT ANGIOGRAPHY HEAD AND NECK TECHNIQUE: Multidetector CT imaging of the head and neck was performed using the standard protocol during bolus administration of intravenous contrast. Multiplanar CT image reconstructions and MIPs were obtained to evaluate the vascular anatomy. Carotid stenosis measurements (when applicable) are obtained utilizing NASCET criteria, using  the distal internal carotid diameter as the denominator. CONTRAST:  14mL OMNIPAQUE IOHEXOL 350 MG/ML SOLN COMPARISON:  CTA head neck 11/21/2018 FINDINGS: CTA NECK FINDINGS SKELETON: Multilevel degenerative disc disease and facet hypertrophy without bony spinal canal stenosis. OTHER NECK: Normal pharynx, larynx and major salivary glands. No cervical lymphadenopathy. Unremarkable thyroid gland. UPPER CHEST: No pneumothorax or pleural effusion. No nodules or masses. AORTIC ARCH: There is mild calcific atherosclerosis of the aortic arch. The ascending thoracic aorta measures 4.1 cm in diameter. Normal variant aortic arch branching pattern with the brachiocephalic and left common carotid arteries sharing a common origin. The visualized proximal subclavian arteries are widely patent. RIGHT CAROTID SYSTEM: --Common carotid artery: There is a small atherosclerotic web extending from the right carotid bifurcation into the proximal right internal carotid artery --Internal carotid artery: Atherosclerotic web extends into the proximal ICA but does not cause stenosis. Remainder of the artery is normal. --External carotid artery: No acute abnormality. LEFT CAROTID SYSTEM: --Common carotid artery: Widely patent origin without common carotid artery dissection or aneurysm.  --Internal carotid artery: Normal without aneurysm, dissection or stenosis. --External carotid artery: No acute abnormality. VERTEBRAL ARTERIES: Right dominant configuration. Both origins are clearly patent. No dissection, occlusion or flow-limiting stenosis to the skull base (V1-V3 segments). Multifocal atherosclerotic calcification. CTA HEAD FINDINGS POSTERIOR CIRCULATION: --Vertebral arteries: Normal V4 segments. --Posterior inferior cerebellar arteries (PICA): Patent origins from the vertebral arteries. --Anterior inferior cerebellar arteries (AICA): Patent origins from the basilar artery. --Basilar artery: Normal. --Superior cerebellar arteries: Normal. --Posterior cerebral arteries (PCA): Normal. Both originate from the basilar artery. Posterior communicating arteries (p-comm) are diminutive or absent. ANTERIOR CIRCULATION: --Intracranial internal carotid arteries: Atherosclerotic calcification of the internal carotid arteries at the skull base without hemodynamically significant stenosis. --Anterior cerebral arteries (ACA): Normal. Both A1 segments are present. Patent anterior communicating artery (a-comm). --Middle cerebral arteries (MCA): Normal. VENOUS SINUSES: As permitted by contrast timing, patent. ANATOMIC VARIANTS: None Review of the MIP images confirms the above findings. IMPRESSION: 1. No emergent large vessel occlusion or evidence of CNS vasculitis. 2. 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 This is new compared to the prior study. 3. 4.1 cm ascending thoracic aortic aneurysm. Recommend annual imaging followup by CTA or MRA. This recommendation follows 2010 ACCF/AHA/AATS/ACR/ASA/SCA/SCAI/SIR/STS/SVM Guidelines for the Diagnosis and Management of Patients with Thoracic Aortic Disease. Circulation. 2010; 121: K093-G182. Aortic aneurysm NOS (ICD10-I71.9) Aortic atherosclerosis (ICD10-I70.0). Electronically Signed   By: Ulyses Jarred M.D.   On: 11/22/2018  03:40   Ct Head Wo Contrast  Result Date: 11/21/2018 CLINICAL DATA:  73 year old male with increased confusion and memory loss over the past 2 days. Recent right frontal lobe hemorrhage. EXAM: CT HEAD WITHOUT CONTRAST TECHNIQUE: Contiguous axial images were obtained from the base of the skull through the vertex without intravenous contrast. COMPARISON:  Head CT dated 11/14/2018 FINDINGS: Brain: The right frontal intraparenchymal hemorrhage is less conspicuous in keeping with evolution of blood product. There is associated edema in the right frontal lobe which is similar in size or slightly decreased. There is mild age-related atrophy and chronic microvascular ischemic changes. No new or acute intracranial hemorrhage. No mass effect or midline shift. No extra-axial fluid collection. Vascular: No hyperdense vessel or unexpected calcification. Skull: Normal. Negative for fracture or focal lesion. Sinuses/Orbits: No acute finding. Other: None IMPRESSION: 1. No acute or new intracranial hemorrhage. 2. Decrease in conspicuity of the right frontal hemorrhage and slight decrease in associated edema. Electronically Signed   By: Milas Hock  Radparvar M.D.   On: 11/21/2018 23:10   Ct Angio Neck W Or Wo Contrast  Result Date: 11/22/2018 CLINICAL DATA:  Possible vasculitis. EXAM: CT ANGIOGRAPHY HEAD AND NECK TECHNIQUE: Multidetector CT imaging of the head and neck was performed using the standard protocol during bolus administration of intravenous contrast. Multiplanar CT image reconstructions and MIPs were obtained to evaluate the vascular anatomy. Carotid stenosis measurements (when applicable) are obtained utilizing NASCET criteria, using the distal internal carotid diameter as the denominator. CONTRAST:  155mL OMNIPAQUE IOHEXOL 350 MG/ML SOLN COMPARISON:  CTA head neck 11/21/2018 FINDINGS: CTA NECK FINDINGS SKELETON: Multilevel degenerative disc disease and facet hypertrophy without bony spinal canal stenosis. OTHER NECK:  Normal pharynx, larynx and major salivary glands. No cervical lymphadenopathy. Unremarkable thyroid gland. UPPER CHEST: No pneumothorax or pleural effusion. No nodules or masses. AORTIC ARCH: There is mild calcific atherosclerosis of the aortic arch. The ascending thoracic aorta measures 4.1 cm in diameter. Normal variant aortic arch branching pattern with the brachiocephalic and left common carotid arteries sharing a common origin. The visualized proximal subclavian arteries are widely patent. RIGHT CAROTID SYSTEM: --Common carotid artery: There is a small atherosclerotic web extending from the right carotid bifurcation into the proximal right internal carotid artery --Internal carotid artery: Atherosclerotic web extends into the proximal ICA but does not cause stenosis. Remainder of the artery is normal. --External carotid artery: No acute abnormality. LEFT CAROTID SYSTEM: --Common carotid artery: Widely patent origin without common carotid artery dissection or aneurysm. --Internal carotid artery: Normal without aneurysm, dissection or stenosis. --External carotid artery: No acute abnormality. VERTEBRAL ARTERIES: Right dominant configuration. Both origins are clearly patent. No dissection, occlusion or flow-limiting stenosis to the skull base (V1-V3 segments). Multifocal atherosclerotic calcification. CTA HEAD FINDINGS POSTERIOR CIRCULATION: --Vertebral arteries: Normal V4 segments. --Posterior inferior cerebellar arteries (PICA): Patent origins from the vertebral arteries. --Anterior inferior cerebellar arteries (AICA): Patent origins from the basilar artery. --Basilar artery: Normal. --Superior cerebellar arteries: Normal. --Posterior cerebral arteries (PCA): Normal. Both originate from the basilar artery. Posterior communicating arteries (p-comm) are diminutive or absent. ANTERIOR CIRCULATION: --Intracranial internal carotid arteries: Atherosclerotic calcification of the internal carotid arteries at the skull  base without hemodynamically significant stenosis. --Anterior cerebral arteries (ACA): Normal. Both A1 segments are present. Patent anterior communicating artery (a-comm). --Middle cerebral arteries (MCA): Normal. VENOUS SINUSES: As permitted by contrast timing, patent. ANATOMIC VARIANTS: None Review of the MIP images confirms the above findings. IMPRESSION: 1. No emergent large vessel occlusion or evidence of CNS vasculitis. 2. 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 This is new compared to the prior study. 3. 4.1 cm ascending thoracic aortic aneurysm. Recommend annual imaging followup by CTA or MRA. This recommendation follows 2010 ACCF/AHA/AATS/ACR/ASA/SCA/SCAI/SIR/STS/SVM Guidelines for the Diagnosis and Management of Patients with Thoracic Aortic Disease. Circulation. 2010; 121: R678-L381. Aortic aneurysm NOS (ICD10-I71.9) Aortic atherosclerosis (ICD10-I70.0). Electronically Signed   By: Ulyses Jarred M.D.   On: 11/22/2018 03:40    Pending Labs Unresulted Labs (From admission, onward)    Start     Ordered   11/23/18 0175  Basic metabolic panel  Tomorrow morning,   R     11/22/18 0844          Vitals/Pain Today's Vitals   11/22/18 1300 11/22/18 1341 11/22/18 1500 11/22/18 1530  BP: (!) 178/90 (!) 166/104 (!) 167/97 (!) 159/91  Pulse:  (!) 53 68   Resp: 16 19 (!) 21 16  Temp:      TempSrc:  SpO2:  96% 96%   PainSc:  0-No pain      Isolation Precautions No active isolations  Medications Medications  sodium chloride flush (NS) 0.9 % injection 3 mL (has no administration in time range)  amiodarone (PACERONE) tablet 200 mg (0 mg Oral Hold 11/22/18 1156)  atorvastatin (LIPITOR) tablet 10 mg (has no administration in time range)  carvedilol (COREG) tablet 12.5 mg (12.5 mg Oral Given 11/22/18 0855)  levETIRAcetam (KEPPRA) tablet 500 mg (500 mg Oral Given 11/22/18 0855)  albuterol (PROVENTIL) (2.5 MG/3ML) 0.083% nebulizer solution 3 mL  (has no administration in time range)  mometasone-formoterol (DULERA) 200-5 MCG/ACT inhaler 2 puff (2 puffs Inhalation Given 11/22/18 1129)  sodium chloride flush (NS) 0.9 % injection 3 mL (has no administration in time range)  acetaminophen (TYLENOL) tablet 650 mg (has no administration in time range)    Or  acetaminophen (TYLENOL) suppository 650 mg (has no administration in time range)  HYDROcodone-acetaminophen (NORCO/VICODIN) 5-325 MG per tablet 1 tablet (has no administration in time range)  polyethylene glycol (MIRALAX / GLYCOLAX) packet 17 g (has no administration in time range)  ondansetron (ZOFRAN) tablet 4 mg (has no administration in time range)    Or  ondansetron (ZOFRAN) injection 4 mg (has no administration in time range)  insulin aspart (novoLOG) injection 0-9 Units (1 Units Subcutaneous Given 11/22/18 1343)  insulin aspart (novoLOG) injection 0-5 Units (has no administration in time range)  labetalol (NORMODYNE) injection 10 mg (has no administration in time range)  0.9 %  sodium chloride infusion (has no administration in time range)  sodium chloride 0.9 % bolus 1,000 mL (0 mLs Intravenous Stopped 11/22/18 0352)  iohexol (OMNIPAQUE) 350 MG/ML injection 100 mL (100 mLs Intravenous Contrast Given 11/22/18 0310)  hydrALAZINE (APRESOLINE) injection 10 mg (10 mg Intravenous Given 11/22/18 1018)  hydrALAZINE (APRESOLINE) injection 10 mg (10 mg Intravenous Given 11/22/18 1502)    Mobility walks with device     Focused Assessments Neuro Assessment Handoff:  Swallow screen pass? Yes    NIH Stroke Scale ( + Modified Stroke Scale Criteria)  Interval: Initial Level of Consciousness (1a.)   : Alert, keenly responsive LOC Questions (1b. )   +: Answers both questions correctly LOC Commands (1c. )   + : Performs both tasks correctly Best Gaze (2. )  +: Normal Visual (3. )  +: No visual loss Facial Palsy (4. )    : Normal symmetrical movements Motor Arm, Left (5a. )   +: No  drift Motor Arm, Right (5b. )   +: No drift Motor Leg, Left (6a. )   +: No drift Motor Leg, Right (6b. )   +: No drift Limb Ataxia (7. ): Absent Sensory (8. )   +: Normal, no sensory loss Best Language (9. )   +: No aphasia Dysarthria (10. ): Normal Extinction/Inattention (11.)   +: No Abnormality Modified SS Total  +: 0 Complete NIHSS TOTAL: 0     Neuro Assessment: Exceptions to WDL(c/o increased confusion. ) Neuro Checks:   Initial (11/22/18 0038)  Last Documented NIHSS Modified Score: 0 (11/22/18 0038) Has TPA been given? No If patient is a Neuro Trauma and patient is going to OR before floor call report to [place] nurse: [phone] or [phone]     R Recommendations: See Admitting Provider Note  Report given to:   Additional Notes: .

## 2018-11-22 NOTE — H&P (Signed)
History and Physical    Ronald [name] FKC:[id number] DOB: [birth year] DOA: 11/21/2018  PCP: Ronald Post, MD   Patient coming from: Home   Chief Complaint: Increasing confusion and off-balance gait   HPI: Ronald [name] is a 73 y.o. male with medical history significant for right frontal ICH in July 2020, atrial fibrillation no longer on anticoagulation, hypertension, chronic diastolic CHF, COPD, chronic kidney disease stage III, type 2 diabetes mellitus, and seizure, now presenting to emergency department for evaluation of worsening confusion and off-balance gait.  He is accompanied by his wife who provides much of the history.  Patient had reportedly been doing well back at home after discharge following his ICH, was able to use the computer, microwave, and television without a problem, and did not seem to be confused.  Over the past 2 weeks however, the patient has become increasingly confused and his gait has been off balance, often appears that he is falling towards his right side.  He is no longer able to work the television, computer, or microwave at home due to his confusion.  He has not been complaining of anything and insists that he feels fine.  He has not been coughing, diaphoretic, vomiting, or having diarrhea.  ED Course: Upon arrival to the ED, patient is found to be afebrile, saturating mid 90s on room air, and with remaining vitals also normal.  EKG features a sinus rhythm.  Noncontrast head CT is negative for acute findings and notable for less conspicuous right frontal hemorrhage and slight decrease in associated edema.  Chemistry panel notable for creatinine 1.62, up from recent priors in the 1.2-1.4 range.  CBC is unremarkable.  Urinalysis unremarkable.  Neurology was consulted by the ED physician and recommended medical admission for further evaluation and management.  Review of Systems:  All other systems reviewed and apart from HPI, are negative.  Past Medical  History:  Diagnosis Date   Allergic rhinitis    Anticoagulant long-term use    eliquis   Cardiomyopathy due to systemic disease ([place])    followed by dr harding   COPD with emphysema [facility])    pulmologist-  dr Halford Chessman   Dyspnea    occasional per pt   History of colon polyps    tubular adenoma 2013   History of gout    09-05-2017 last flare-up  05/ 2019 3 wks ago, feet   History of sepsis 02/18/2017   per d/c note probable uti, acute chf, acute renal failure, hypoxia   Hyperlipidemia    Hyperplasia of prostate with lower urinary tract symptoms (LUTS)    Hypertension    OSA on CPAP    per study 08-03-2004  Severe OSA   Persistent atrial fibrillation    cardiologist --  dr Dorris Carnes--  [name] cardioversion 05-07-2014   Prostate cancer [facility]) urologsit-  dr Alyson Ingles--- as of 05-21-2017 per pt last PSA 11 approx.   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 ([place])    pulmologist-  dr Halford Chessman   Seasonal allergies    Sigmoid diverticulosis    Systolic and diastolic CHF, chronic [facility])    cardiologist-  [name]   Tinea versicolor    Type 2 diabetes mellitus ([place])    Wears hearing aid in both ears     Past Surgical History:  Procedure Laterality Date   CARDIOVERSION N/A 05/07/2014   Procedure: CARDIOVERSION;  Surgeon: [name], MD;  Location: [place];  Service: Cardiovascular;  Laterality: N/A;   CARDIOVERSION N/A 09/09/2014   Procedure: CARDIOVERSION;  Surgeon: Lelon Perla, MD;  Location: [facility] ENDOSCOPY;  Service: Cardiovascular;  Laterality: N/A;  successfully   CATARACT EXTRACTION W/ INTRAOCULAR LENS  IMPLANT, BILATERAL  08 and 09/  2018   COLONOSCOPY  last one 06-07-2011   GOLD SEED IMPLANT N/A 09/09/2017   Procedure: GOLD SEED IMPLANT;  Surgeon: [name], MD;  Location:  [facility];  Service: Urology;  Laterality: N/A;   HYDROCELE EXCISION Bilateral 09/26/2015   Procedure: HYDROCELECTOMY ADULT;  Surgeon: [name], MD;  Location: [facility];  Service: Urology;  Laterality: Bilateral;   LAMINECTOMY AND MICRODISCECTOMY LUMBAR SPINE  12-23-2003   Left L5 -- S1   LAPAROSCOPIC BILATERAL INGUINAL HERNIA REPAIR/  UMBILICAL HERNIA REPAIR WITH MESH/  ASPIRATION LEFT HYDROCELE  07-11-2012   NM MYOVIEW LTD  05/18/2014   Low risk study. Normal perfusion: No ischemia or infarction. Mild LV dysfunction - 46% (does not correlate with echocardiographic EF of 50-55%)   PROSTATE BIOPSY  05/15/12   Clinically both Lobes   SPACE OAR INSTILLATION N/A 09/09/2017   Procedure: SPACE OAR INSTILLATION;  Surgeon: [name], MD;  Location: [facility];  Service: Urology;  Laterality: N/A;   TEE WITHOUT CARDIOVERSION N/A 05/07/2014   Procedure: TRANSESOPHAGEAL ECHOCARDIOGRAM (TEE);  Surgeon: [name], MD;  Location: [facility] ENDOSCOPY;  Service: Cardiovascular;  Laterality: N/A;   mild atherosclerosis plaque of aorta,  mild AR, MR, and TR,  no cardiac source of emboli   TONSILLECTOMY  as child   TRANSTHORACIC ECHOCARDIOGRAM  11/'18; 1/'19   a) In setting of sepsis: EF of 40-45%.  Diffuse hypokinesis.  No RWMA.  Biatrial enlargement.;; b) ** f/u Jan 2019: Normal LVF 55-60%** , mildly dilated aortic root(38 mm) and ascending aorta (44 mm). Compared to prior echo, LVEF has improved.   TRANSURETHRAL RESECTION OF PROSTATE N/A 05/30/2017   Procedure: TRANSURETHRAL RESECTION OF THE PROSTATE (TURP);  Surgeon: [name], MD;  Location: [facility];  Service: Urology;  Laterality: N/A;     reports that he has been smoking cigarettes. He has a 15.00 pack-year smoking history. He has never used smokeless tobacco. He reports current alcohol use of about 14.0 standard drinks of alcohol per week. He reports that he does not use  drugs.  No Known Allergies  Family History  Problem Relation Age of Onset   Breast cancer Mother    Stroke Other        Unknown    Ovarian cancer Sister    Colon cancer Neg Hx    Esophageal cancer Neg Hx    Rectal cancer Neg Hx    Stomach cancer Neg Hx      Prior to Admission medications   Medication Sig Start Date End Date Taking? Authorizing Provider  albuterol (VENTOLIN HFA) 108 (90 Base) MCG/ACT inhaler Inhale 2 puffs into the lungs every 6 (six) hours as needed for wheezing or shortness of breath. 03/12/18   Chesley Mires, MD  amiodarone (PACERONE) 200 MG tablet Take 1 tablet (200 mg total) by mouth daily. 10/27/18   Leonie Man, MD  aspirin EC 81 MG EC tablet Take 1 tablet (81 mg total) by mouth daily. 10/28/18   Donzetta Starch, NP  atorvastatin (LIPITOR) 10 MG  tablet TAKE 1 TABLET BY MOUTH DAILY EACH EVENING Patient taking differently: Take 10 mg by mouth at bedtime.  06/26/18   Burchette, Alinda Sierras, MD  azelastine (ASTELIN) 0.1 % nasal spray Place 1 spray into both nostrils 2 (two) times daily as needed for rhinitis or allergies. 10/27/18   Donzetta Starch, NP  Blood Glucose Monitoring Suppl (ACCU-CHEK AVIVA PLUS) w/Device KIT Use blood glucose machine as instructed on your strips 04/22/18   Burchette, Alinda Sierras, MD  budesonide-formoterol (SYMBICORT) 160-4.5 MCG/ACT inhaler Inhale 2 puffs into the lungs 2 (two) times daily. 03/26/18   Chesley Mires, MD  butalbital-acetaminophen-caffeine (FIORICET) [phone] MG tablet Take 1 tablet by mouth every 8 (eight) hours as needed for headache. 10/27/18   Donzetta Starch, NP  carvedilol (COREG) 12.5 MG tablet TAKE 1 TABLET (12.5 MG TOTAL) BY MOUTH 2 (TWO) TIMES DAILY. 08/08/18 11/06/18  Leonie Man, MD  colchicine 0.6 MG tablet Take 1-2 tablets (0.6-1.2 mg total) by mouth See admin instructions. Take 1.2 mg by mouth at onset of gout flare, then 0.6 mg two times a day as needed for flare(s) 10/27/18   Donzetta Starch, NP  furosemide (LASIX)  40 MG tablet TAKE 1-2 TABLETS DAILY AS NEEDED. 10/27/18   Donzetta Starch, NP  glucose blood (ACCU-CHEK AVIVA PLUS) test strip Test 2 times daily dx e11.9 08/20/18   Burchette, Alinda Sierras, MD  KLOR-CON M20 20 MEQ tablet TAKE 2 TABLETS BY MOUTH EVERY DAY Patient taking differently: Take 40 mEq by mouth daily.  10/21/18   Burchette, Alinda Sierras, MD  Lancets (ACCU-CHEK SOFT TOUCH) lancets Check blood sugars once per day. DX: E11.9 10/24/15   Burchette, Alinda Sierras, MD  levETIRAcetam (KEPPRA) 500 MG tablet Take 1 tablet (500 mg total) by mouth 2 (two) times daily. 10/27/18   Donzetta Starch, NP  losartan (COZAAR) 25 MG tablet TAKE 1 TABLET BY MOUTH EVERY DAY Patient taking differently: Take 25 mg by mouth daily.  02/03/18   Leonie Man, MD  metFORMIN (GLUCOPHAGE) 500 MG tablet Take 2 tablets (1,000 mg total) by mouth 2 (two) times daily with a meal. 10/14/18   Burchette, Alinda Sierras, MD  metoprolol tartrate (LOPRESSOR) 25 MG tablet Take  1 tablet if  you have breakthrough atrial fib with extra dose of 200 mg Amiodarone. Patient taking differently: Take 25 mg by mouth See admin instructions. Take 25 mg by mouth for breakthrough A-Fib (with an extra dose of 200 mg Amiodarone) 07/30/17   Leonie Man, MD  montelukast (SINGULAIR) 10 MG tablet Take 1 tablet (10 mg total) by mouth every evening. 10/27/18   Donzetta Starch, NP  nicotine (NICODERM CQ - DOSED IN MG/24 HOURS) 14 mg/24hr patch Place 1 patch (14 mg total) onto the skin daily. 10/28/18   Donzetta Starch, NP  PRESCRIPTION MEDICATION CPAP- At bedtime    [provider]  Semaglutide,0.25 or 0.5MG/DOS, (OZEMPIC, 0.25 OR 0.5 MG/DOSE,) 2 MG/1.5ML SOPN Inject 0.5 mg into the skin every Sunday. 11/02/18   Donzetta Starch, NP  triamcinolone cream (KENALOG) 0.1 % Apply 1 application topically daily as needed (to affected sites).     [provider]    Physical Exam: Vitals:   11/22/18 0045 11/22/18 0100 11/22/18 0330 11/22/18 0415  BP: (!) 156/92 (!) 164/94  (!) 168/95 137/87  Pulse:   72 67  Resp: _0 Temp:      TempSrc:      SpO2:  94% 93%    Constitutional: NAD, calm  Eyes: PERTLA, lids and conjunctivae normal ENMT: Mucous membranes are moist. Posterior pharynx clear of any exudate or lesions.   Neck: normal, supple, no masses, no thyromegaly Respiratory: no wheezing, no crackles. Normal respiratory effort. No accessory muscle use.  Cardiovascular: S1 & S2 heard, regular rate and rhythm. No extremity edema.   Abdomen: No distension, no tenderness, soft. Bowel sounds normal.  Musculoskeletal: no clubbing / cyanosis. No joint deformity upper and lower extremities.   Skin: no significant rashes, lesions, ulcers. Warm, dry, well-perfused. Neurologic: CN 2-12 grossly intact. Sensation intact. Strength 5/5 in all 4 limbs.  Psychiatric: Alert and oriented to person, place, and situation. Pleasant, cooperative.    Labs on Admission: I have personally reviewed following labs and imaging studies  CBC: Recent Labs  Lab 11/21/18 1644  WBC 5.2  NEUTROABS 3.4  HGB 14.8  HCT 43.1  MCV 98.0  PLT 939   Basic Metabolic Panel: Recent Labs  Lab 11/21/18 1644  NA 137  K 4.4  CL 104  CO2 22  GLUCOSE 135*  BUN 18  CREATININE 1.62*  CALCIUM 9.3   GFR: Estimated Creatinine Clearance: 49.6 mL/min (A) (by C-G formula based on SCr of 1.62 mg/dL (H)). Liver Function Tests: Recent Labs  Lab 11/21/18 1644  AST 18  ALT 21  ALKPHOS 82  BILITOT 0.8  PROT 6.5  ALBUMIN 3.8   No results for input(s): LIPASE, AMYLASE in the last 168 hours. No results for input(s): AMMONIA in the last 168 hours. Coagulation Profile: Recent Labs  Lab 11/21/18 1644  INR 1.0   Cardiac Enzymes: No results for input(s): CKTOTAL, CKMB, CKMBINDEX, TROPONINI in the last 168 hours. BNP (last 3 results) No results for input(s): PROBNP in the last 8760 hours. HbA1C: No results for input(s): HGBA1C in the last 72 hours. CBG: No results for input(s):  GLUCAP in the last 168 hours. Lipid Profile: No results for input(s): CHOL, HDL, LDLCALC, TRIG, CHOLHDL, LDLDIRECT in the last 72 hours. Thyroid Function Tests: No results for input(s): TSH, T4TOTAL, FREET4, T3FREE, THYROIDAB in the last 72 hours. Anemia Panel: No results for input(s): VITAMINB12, FOLATE, FERRITIN, TIBC, IRON, RETICCTPCT in the last 72 hours. Urine analysis:    Component Value Date/Time   COLORURINE STRAW (A) 11/22/2018 0307   APPEARANCEUR CLEAR 11/22/2018 0307   LABSPEC 1.008 11/22/2018 0307   PHURINE 5.0 11/22/2018 0307   GLUCOSEU NEGATIVE 11/22/2018 0307   HGBUR NEGATIVE 11/22/2018 0307   BILIRUBINUR NEGATIVE 11/22/2018 0307   BILIRUBINUR n 03/10/2012 0858   KETONESUR NEGATIVE 11/22/2018 0307   PROTEINUR NEGATIVE 11/22/2018 0307   UROBILINOGEN 1.0 05/04/2014 1123   NITRITE NEGATIVE 11/22/2018 0307   LEUKOCYTESUR NEGATIVE 11/22/2018 0307   Sepsis Labs: _0 (procalcitonin:4,lacticidven:4) )No results found for this or any previous visit (from the past 240 hour(s)).   Radiological Exams on Admission: Ct Angio Head W Or Wo Contrast  Result Date: 11/22/2018 CLINICAL DATA:  Possible vasculitis. EXAM: CT ANGIOGRAPHY HEAD AND NECK TECHNIQUE: Multidetector CT imaging of the head and neck was performed using the standard protocol during bolus administration of intravenous contrast. Multiplanar CT image reconstructions and MIPs were obtained to evaluate the vascular anatomy. Carotid stenosis measurements (when applicable) are obtained utilizing NASCET criteria, using the distal internal carotid diameter as the denominator. CONTRAST:  167m OMNIPAQUE IOHEXOL 350 MG/ML SOLN COMPARISON:  CTA head neck 11/21/2018 FINDINGS: CTA NECK FINDINGS SKELETON: Multilevel degenerative disc disease and facet hypertrophy without bony spinal canal  stenosis. OTHER NECK: Normal pharynx, larynx and major salivary glands. No cervical lymphadenopathy. Unremarkable thyroid gland. UPPER CHEST:  No pneumothorax or pleural effusion. No nodules or masses. AORTIC ARCH: There is mild calcific atherosclerosis of the aortic arch. The ascending thoracic aorta measures 4.1 cm in diameter. Normal variant aortic arch branching pattern with the brachiocephalic and left common carotid arteries sharing a common origin. The visualized proximal subclavian arteries are widely patent. RIGHT CAROTID SYSTEM: --Common carotid artery: There is a small atherosclerotic web extending from the right carotid bifurcation into the proximal right internal carotid artery --Internal carotid artery: Atherosclerotic web extends into the proximal ICA but does not cause stenosis. Remainder of the artery is normal. --External carotid artery: No acute abnormality. LEFT CAROTID SYSTEM: --Common carotid artery: Widely patent origin without common carotid artery dissection or aneurysm. --Internal carotid artery: Normal without aneurysm, dissection or stenosis. --External carotid artery: No acute abnormality. VERTEBRAL ARTERIES: Right dominant configuration. Both origins are clearly patent. No dissection, occlusion or flow-limiting stenosis to the skull base (V1-V3 segments). Multifocal atherosclerotic calcification. CTA HEAD FINDINGS POSTERIOR CIRCULATION: --Vertebral arteries: Normal V4 segments. --Posterior inferior cerebellar arteries (PICA): Patent origins from the vertebral arteries. --Anterior inferior cerebellar arteries (AICA): Patent origins from the basilar artery. --Basilar artery: Normal. --Superior cerebellar arteries: Normal. --Posterior cerebral arteries (PCA): Normal. Both originate from the basilar artery. Posterior communicating arteries (p-comm) are diminutive or absent. ANTERIOR CIRCULATION: --Intracranial internal carotid arteries: Atherosclerotic calcification of the internal carotid arteries at the skull base without hemodynamically significant stenosis. --Anterior cerebral arteries (ACA): Normal. Both A1 segments are  present. Patent anterior communicating artery (a-comm). --Middle cerebral arteries (MCA): Normal. VENOUS SINUSES: As permitted by contrast timing, patent. ANATOMIC VARIANTS: None Review of the MIP images confirms the above findings. IMPRESSION: 1. No emergent large vessel occlusion or evidence of CNS vasculitis. 2. 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 This is new compared to the prior study. 3. 4.1 cm ascending thoracic aortic aneurysm. Recommend annual imaging followup by CTA or MRA. This recommendation follows 2010 ACCF/AHA/AATS/ACR/ASA/SCA/SCAI/SIR/STS/SVM Guidelines for the Diagnosis and Management of Patients with Thoracic Aortic Disease. Circulation. 2010; 121: P295-J884. Aortic aneurysm NOS (ICD10-I71.9) Aortic atherosclerosis (ICD10-I70.0). Electronically Signed   By: Ulyses Jarred M.D.   On: 11/22/2018 03:40   Ct Head Wo Contrast  Result Date: 11/21/2018 CLINICAL DATA:  73 year old male with increased confusion and memory loss over the past 2 days. Recent right frontal lobe hemorrhage. EXAM: CT HEAD WITHOUT CONTRAST TECHNIQUE: Contiguous axial images were obtained from the base of the skull through the vertex without intravenous contrast. COMPARISON:  Head CT dated 11/14/2018 FINDINGS: Brain: The right frontal intraparenchymal hemorrhage is less conspicuous in keeping with evolution of blood product. There is associated edema in the right frontal lobe which is similar in size or slightly decreased. There is mild age-related atrophy and chronic microvascular ischemic changes. No new or acute intracranial hemorrhage. No mass effect or midline shift. No extra-axial fluid collection. Vascular: No hyperdense vessel or unexpected calcification. Skull: Normal. Negative for fracture or focal lesion. Sinuses/Orbits: No acute finding. Other: None IMPRESSION: 1. No acute or new intracranial hemorrhage. 2. Decrease in conspicuity of the right frontal hemorrhage and  slight decrease in associated edema. Electronically Signed   By: Anner Crete M.D.   On: 11/21/2018 23:10   Ct Angio Neck W Or Wo Contrast  Result Date: 11/22/2018 CLINICAL DATA:  Possible vasculitis. EXAM: CT ANGIOGRAPHY HEAD AND NECK TECHNIQUE: Multidetector CT imaging of the  head and neck was performed using the standard protocol during bolus administration of intravenous contrast. Multiplanar CT image reconstructions and MIPs were obtained to evaluate the vascular anatomy. Carotid stenosis measurements (when applicable) are obtained utilizing NASCET criteria, using the distal internal carotid diameter as the denominator. CONTRAST:  141m OMNIPAQUE IOHEXOL 350 MG/ML SOLN COMPARISON:  CTA head neck 11/21/2018 FINDINGS: CTA NECK FINDINGS SKELETON: Multilevel degenerative disc disease and facet hypertrophy without bony spinal canal stenosis. OTHER NECK: Normal pharynx, larynx and major salivary glands. No cervical lymphadenopathy. Unremarkable thyroid gland. UPPER CHEST: No pneumothorax or pleural effusion. No nodules or masses. AORTIC ARCH: There is mild calcific atherosclerosis of the aortic arch. The ascending thoracic aorta measures 4.1 cm in diameter. Normal variant aortic arch branching pattern with the brachiocephalic and left common carotid arteries sharing a common origin. The visualized proximal subclavian arteries are widely patent. RIGHT CAROTID SYSTEM: --Common carotid artery: There is a small atherosclerotic web extending from the right carotid bifurcation into the proximal right internal carotid artery --Internal carotid artery: Atherosclerotic web extends into the proximal ICA but does not cause stenosis. Remainder of the artery is normal. --External carotid artery: No acute abnormality. LEFT CAROTID SYSTEM: --Common carotid artery: Widely patent origin without common carotid artery dissection or aneurysm. --Internal carotid artery: Normal without aneurysm, dissection or stenosis. --External  carotid artery: No acute abnormality. VERTEBRAL ARTERIES: Right dominant configuration. Both origins are clearly patent. No dissection, occlusion or flow-limiting stenosis to the skull base (V1-V3 segments). Multifocal atherosclerotic calcification. CTA HEAD FINDINGS POSTERIOR CIRCULATION: --Vertebral arteries: Normal V4 segments. --Posterior inferior cerebellar arteries (PICA): Patent origins from the vertebral arteries. --Anterior inferior cerebellar arteries (AICA): Patent origins from the basilar artery. --Basilar artery: Normal. --Superior cerebellar arteries: Normal. --Posterior cerebral arteries (PCA): Normal. Both originate from the basilar artery. Posterior communicating arteries (p-comm) are diminutive or absent. ANTERIOR CIRCULATION: --Intracranial internal carotid arteries: Atherosclerotic calcification of the internal carotid arteries at the skull base without hemodynamically significant stenosis. --Anterior cerebral arteries (ACA): Normal. Both A1 segments are present. Patent anterior communicating artery (a-comm). --Middle cerebral arteries (MCA): Normal. VENOUS SINUSES: As permitted by contrast timing, patent. ANATOMIC VARIANTS: None Review of the MIP images confirms the above findings. IMPRESSION: 1. No emergent large vessel occlusion or evidence of CNS vasculitis. 2. 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 This is new compared to the prior study. 3. 4.1 cm ascending thoracic aortic aneurysm. Recommend annual imaging followup by CTA or MRA. This recommendation follows 2010 ACCF/AHA/AATS/ACR/ASA/SCA/SCAI/SIR/STS/SVM Guidelines for the Diagnosis and Management of Patients with Thoracic Aortic Disease. Circulation. 2010; 121:: [id number] Aortic aneurysm NOS (ICD10-I71.9) Aortic atherosclerosis (ICD10-I70.0). Electronically Signed   By: KUlyses JarredM.D.   On: 11/22/2018 03:40    EKG: Independently reviewed. Sinus rhythm, rate 79, QTc 435 ms.    Assessment/Plan   1. Acute encephalopathy  - [place], was doing well back at home initially per wife, but now presents with progressively worsening confusion and gait difficulty over the past 2 weeks - CT head appears stable; CTA H&N with no LVO or evidence for CNS vasculitis  - Neurology consulting and much appreciated  - Plan for MRI brain pending improvement in GFR, EEG, ESR and CRP, continued supportive care   2. Recent ICH  - CT head appears stable with slight decrease in associated edema, planned for MRI brain pending improvement in GFR   3. Seizures  - EEG ordered as above, continue Keppra    4.  Paroxysmal atrial fibrillation  - In sinus rhythm on admission  - CHADS-VASc 7 (age, CHF, HTN, DM, CHF, CVA x2)  - Not anticoagulated after recent ICH  - Continue amiodarone    5. Hypertension  - BP at goal  - Continue Coreg, hold losartan in light of increased creatinine   6. Type II DM  - A1c was 6.9% in July 2020  - Managed with metformin and Ozempic at home, held on admission  - Check CBG's and use SSI with Novolog while in hospital   7. Chronic diastolic CHF  - Appears compensated  - Lasix held and IVF hydration started  - Follow daily wt and I/O's, continue Coreg, hold losartan until renal function returns to baseline    8. CKD III  - SCr is 1.62 on admission with recent priors in 1.2-1.4 range  - He does not appear hypervolemic and will be hydrated with IVF in attempt to improve GFR prior to contrast-enhanced MRI  - Renally-dose medications, continue IVF hydration, hold losartan, repeat chem panel in am    9. OSA  - Continue CPAP qHS   10. Ascending thoracic aortic aneurysm  - 4.1 cm, noted incidentally on CTA neck in ED  - Follow-up imaging is recommended    PPE: Mask, face shield  DVT prophylaxis: SCD's  Code Status: Full  Family Communication: Wife updated at bedside Consults called: Neurology  Admission status: Observation      Vianne Bulls, MD [facility] Pager [phone]  If 7PM-7AM, please contact night-coverage [email or web address] Password [facility]  11/22/2018, 4:42 AM

## 2018-11-22 NOTE — ED Notes (Signed)
Attempted report X3 

## 2018-11-22 NOTE — Consult Note (Signed)
NEURO HOSPITALIST CONSULT NOTE   Requestig physician: Dr. Leonides Schanz  Reason for Consult: Progressive confusion  History obtained from:  Wife and Chart    HPI:                                                                                                                                          Ronald [name] is an 73 y.o. male with atrial fibrillation previously on anticoagulation, HTN and recent anterior right frontal lobe ICH in July, presenting with a 2 week history of progressive mental decline, acutely worsened over the past 2 days, in conjunction with new onset of gait instability.   Wife had called [facility] on Friday afternoon regarding the above symptoms and the patient was advised to come to the ED. He was evaluated for same one week ago with work up including an MRI that revealed stable right anterior frontal lobe ICH.   Past Medical History:  Diagnosis Date  . Allergic rhinitis   . Anticoagulant long-term use    eliquis  . Cardiomyopathy due to systemic disease [facility])    followed by dr harding  . COPD with emphysema [facility])    pulmologist-  dr Halford Chessman  . Dyspnea    occasional per pt  . History of colon polyps    tubular adenoma 2013  . History of gout    09-05-2017 last flare-up  05/ 2019 3 wks ago, feet  . History of sepsis 02/18/2017   per d/c note probable uti, acute chf, acute renal failure, hypoxia  . Hyperlipidemia   . Hyperplasia of prostate with lower urinary tract symptoms (LUTS)   . Hypertension   . OSA on CPAP    per study 08-03-2004  Severe OSA  . Persistent atrial fibrillation    cardiologist --  dr Dorris Carnes--  post cardioversion 05-07-2014  . Prostate cancer [facility]) urologsit-  dr Alyson Ingles--- as of 05-21-2017 per pt last PSA 11 approx.   Dx  2014--  stage T1c, Gleason 3+3=6, PSA 6.67--  Active surveillance/  04/ 2019  Stage T1b, Gleason 3+4, PSA 12.8- plan external radiation therapy    . Respiratory bronchiolitis associated  interstitial lung disease ([place])    pulmologist-  dr Halford Chessman  . Seasonal allergies   . Sigmoid diverticulosis   . Systolic and diastolic CHF, chronic [facility])    cardiologist-  [name]  . Tinea versicolor   . Type 2 diabetes mellitus ([place])   . Wears hearing aid in both ears     Past Surgical History:  Procedure Laterality Date  . CARDIOVERSION N/A 05/07/2014   Procedure: CARDIOVERSION;  Surgeon: [name], MD;  Location: [facility] ENDOSCOPY;  Service: Cardiovascular;  Laterality: N/A;  . CARDIOVERSION N/A 09/09/2014   Procedure: CARDIOVERSION;  Surgeon: Aaron Edelman  Jacalyn Lefevre, MD;  Location: [facility] ENDOSCOPY;  Service: Cardiovascular;  Laterality: N/A;  successfully  . CATARACT EXTRACTION W/ INTRAOCULAR LENS  IMPLANT, BILATERAL  08 and 09/  2018  . COLONOSCOPY  last one 06-07-2011  . GOLD SEED IMPLANT N/A 09/09/2017   Procedure: GOLD SEED IMPLANT;  Surgeon: [name], MD;  Location[facility];  Service: Urology;  Laterality: N/A;  . HYDROCELE EXCISION Bilateral 09/26/2015   Procedure: HYDROCELECTOMY ADULT;  Surgeon: [name], MD;  Location: [facility];  Service: Urology;  Laterality: Bilateral;  . LAMINECTOMY AND MICRODISCECTOMY LUMBAR SPINE  12-23-2003   Left L5 -- S1  . LAPAROSCOPIC BILATERAL INGUINAL HERNIA REPAIR/  UMBILICAL HERNIA REPAIR WITH MESH/  ASPIRATION LEFT HYDROCELE  07-11-2012  . NM MYOVIEW LTD  05/18/2014   Low risk study. Normal perfusion: No ischemia or infarction. Mild LV dysfunction - 46% (does not correlate with echocardiographic EF of 50-55%)  . PROSTATE BIOPSY  05/15/12   Clinically both Lobes  . SPACE OAR INSTILLATION N/A 09/09/2017   Procedure: SPACE OAR INSTILLATION;  Surgeon: [name], MD;  Location: [facility];  Service: Urology;  Laterality: N/A;  . TEE WITHOUT CARDIOVERSION N/A 05/07/2014   Procedure: TRANSESOPHAGEAL ECHOCARDIOGRAM (TEE);  Surgeon: [name], MD;  Location: [facility] ENDOSCOPY;  Service:  Cardiovascular;  Laterality: N/A;   mild atherosclerosis plaque of aorta,  mild AR, MR, and TR,  no cardiac source of emboli  . TONSILLECTOMY  as child  . TRANSTHORACIC ECHOCARDIOGRAM  11/'18; 1/'19   a) In setting of sepsis: EF of 40-45%.  Diffuse hypokinesis.  No RWMA.  Biatrial enlargement.;; b) ** f/u Jan 2019: Normal LVF 55-60%** , mildly dilated aortic root(38 mm) and ascending aorta (44 mm). Compared to prior echo, LVEF has improved.  . TRANSURETHRAL RESECTION OF PROSTATE N/A 05/30/2017   Procedure: TRANSURETHRAL RESECTION OF THE PROSTATE (TURP);  Surgeon: [name], MD;  Location: [facility];  Service: Urology;  Laterality: N/A;    Family History  Problem Relation Age of Onset  . Breast cancer Mother   . Stroke Other        Unknown   . Ovarian cancer Sister   . Colon cancer Neg Hx   . Esophageal cancer Neg Hx   . Rectal cancer Neg Hx   . Stomach cancer Neg Hx               Social History:  reports that he has been smoking cigarettes. He has a 15.00 pack-year smoking history. He has never used smokeless tobacco. He reports current alcohol use of about 14.0 standard drinks of alcohol per week. He reports that he does not use drugs.  No Known Allergies  HOME MEDICATIONS:                                                                                                                        ROS:  As per HPI. Does not endorse any additional complaints.    Blood pressure (!) 164/94, pulse 76, temperature 97.9 F (36.6 C), temperature source Oral, resp. rate 20, SpO2 98 %.   General Examination:                                                                                                       Physical Exam  HEENT-  [place]/AT   Lungs- Respirations unlabored Extremities- No edema  Neurological Examination Mental Status: Alert and fully  oriented, except to day of week. Some disinhibition noted. Easily distracted with poor attention and concentration. Cooperative. Speech fluent with intact comprehension and naming. Able to follow a 3 step directional command without difficulty. Letter fluency impaired with 4 words generated in 60 seconds. Categorical fluency impaired with 4 words generated in 60 seconds. No paraphasias noted. Unable to name months of the year backwards and had difficulty initiating this task with months of the year forwards, but then completed the list normally. Has impaired long-term recall regarding a family member's vocation. Moderate motor apraxia is noted. Overall impairments are mostly frontal executive with relatively spared temporal and parietal cognitive/perceptual functions.  Cranial Nerves: II: Visual fields intact. PERRL.  III,IV, VI: No ptosis. EOMI.  V,VII: No facial droop. Temp sensation equal bilaterally  VIII: hearing intact to voice IX,X: Palate rises symmetrically XI: Symmetric XII: midline tongue extension Motor: Right : Upper extremity   5/5    Left:     Upper extremity   5/5  Lower extremity   5/5     Lower extremity   5/5 No pronator drift Sensory: Temp and light touch intact throughout, bilaterally. No extinction.  Deep Tendon Reflexes: 2+ and symmetric throughout Cerebellar: No ataxia with FNF and RAM bilaterally  Gait: Wide based with small steps and unsteadiness.    Lab Results: Basic Metabolic Panel: Recent Labs  Lab 11/21/18 1644  NA 137  K 4.4  CL 104  CO2 22  GLUCOSE 135*  BUN 18  CREATININE 1.62*  CALCIUM 9.3    CBC: Recent Labs  Lab 11/21/18 1644  WBC 5.2  NEUTROABS 3.4  HGB 14.8  HCT 43.1  MCV 98.0  PLT 207    Cardiac Enzymes: No results for input(s): CKTOTAL, CKMB, CKMBINDEX, TROPONINI in the last 168 hours.  Lipid Panel: No results for input(s): CHOL, TRIG, HDL, CHOLHDL, VLDL, LDLCALC in the last 168 hours.  Imaging: Ct Head Wo Contrast  Result  Date: 11/21/2018 CLINICAL DATA:  73 year old male with increased confusion and memory loss over the past 2 days. Recent right frontal lobe hemorrhage. EXAM: CT HEAD WITHOUT CONTRAST TECHNIQUE: Contiguous axial images were obtained from the base of the skull through the vertex without intravenous contrast. COMPARISON:  Head CT dated 11/14/2018 FINDINGS: Brain: The right frontal intraparenchymal hemorrhage is less conspicuous in keeping with evolution of blood product. There is associated edema in the right frontal lobe which is similar in size or slightly decreased. There is mild age-related atrophy and chronic microvascular ischemic changes. No new or acute intracranial hemorrhage. No mass  effect or midline shift. No extra-axial fluid collection. Vascular: No hyperdense vessel or unexpected calcification. Skull: Normal. Negative for fracture or focal lesion. Sinuses/Orbits: No acute finding. Other: None IMPRESSION: 1. No acute or new intracranial hemorrhage. 2. Decrease in conspicuity of the right frontal hemorrhage and slight decrease in associated edema. Electronically Signed   By: Anner Crete M.D.   On: 11/21/2018 23:10    Assessment: 73 year old male with recent right frontal lobe ICH, presenting with a 2 week history of progressive mental decline, acutely worsened over the past 2 days, in conjunction with new onset of gait instability.  1. Exam best localizes as bilateral anterior frontal lobe dysfunction. Overall impairments are mostly frontal executive with somewhat impaired but relatively spared temporal and parietal functions, and normal visual and gross motor functions. His wide-based unsteady gait with small steps is localizable to the medial frontal lobe periventricular white matter tracts. The hemorrhage is revealed to be stable in size on CT this admission and therefore cannot fully account for the patient's worsening cognition. He also fits the triad for NPH (incontinence, gait unsteadiness  and cognitive impairment); however, his ventricles are stable in size relative to the July 14 CT and the ventricular sizes were not consistent with NPH at that time either.  2. DDx for presentation includes subclinical seizures and CNS vasculitis (the latter would explain both the hemorrhage and the progressive cognitive worsening since the hemorrhage). Also on DDx would be a rapidly progressive dementia or subarachnoid blood not visible on prior MRI which could result in diffuse cortical dysfunction due to superficial siderosis.  3. History of seizure x 1 at the time of his initial presentation with the right frontal lobe hemorrhage. He is on scheduled Keppra at home.  4. Atrial fibrillation. No longer on anticoagulation due to recent ICH.   Recommendations: 1. EEG in AM (ordered) 2. Continue Keppra 3. CTA of head and neck to assess for possible pattern of vasculitic narrowing (ordered).   4. ESR and C-reactive protein 5. MRI brain with contrast if eGFR can be increased above 50 with IVF. Otherwise perform MRI brain without contrast to assess for possible changes from MRI performed on 8/7.  6. IV hydration   Electronically signed: Dr. Kerney Elbe 11/22/2018, 2:45 AM

## 2018-11-22 NOTE — Progress Notes (Signed)
Patient is a 73 year old male with history of recent right frontal ICH, A. fib not on anticoagulation, hypertension, chronic diastolic CHF, COPD, CKD stage III, diabetes type 2, seizure disorder who presents from home for evaluation of worsening confusion and gait problem.  Neurology has been consulted and following. CT imaging of the brain did not show any acute intracranial abnormality. Patient admitted for the evaluation of altered mental status. Plan is to do EEG and MRI of the brain with contrast when kidney function improves. Patient seen and examined the bedside this morning.  Currently hemodynamically stable.  Comfortable. His mental status seems to have improved.  He was alert, awake and oriented.  He could tell current  Month and year.  He was oriented to place and person. We will continue to monitor the patient.  Patient seen by Dr. Myna Hidalgo this morning.

## 2018-11-22 NOTE — Progress Notes (Addendum)
Neurology - same-day progress note  Subjective: Patient seen at bedside.  His wife is at bedside.  Past medical history of atrial fibrillation who was on Eliquis and had a right frontal ICH on 10/20/2018, COPD with emphysema, hypertension, obstructive sleep apnea, hyperlipidemia, tobacco use, chronic congestive systolic and diastolic heart failure. Patient with a recent history of a right frontal ICH, discharged home after last admission. Had one seizure episode.  Was on Keppra 500 twice daily in the hospital which was continued at home. Wife is not report any problem with Keppra in terms of any aggression or increased somnolence. The main issue that brought him back to the emergency room is progressive confusion and intermittent difficulty walking along with increasing frequency of gait instability and falls.  Objective: Systolic blood pressures between 1 70-1 80 during the encounter.  Heart rate 57 Breathing normally saturating well at room air He is awake, alert, oriented x3 although he does seem disinterested in the conversation and has a rather flat affect. His speech is clear.  He does have diminished attention and concentration. He is having more trouble with short-term memory-could not recollect 3 objects, but remembers historical events such as family members birthdays etc. Cranial nerves: Pupils equal round react light, extraocular movements intact, visual fields full, face appears symmetric, auditory acuity intact, tongue and palate midline. Motor exam: Symmetric strength in all 4 extremities Sensory exam: Intact light touch all over Coordination: No dysmetria noted  Imaging: MRI brain done yesterday in the emergency room and compared with the MRI of 10/22/2018 shows aging blood products at the site of previously demonstrated right frontal ICH.  No acute ICH.  Minimal adjacent edema to the area of the Carmi, improving from the prior study.  There is leftward bulging of the right cingulate  gyrus that is unchanged.  No acute ischemia.  Laboratory studies reveal mildly deranged renal function over baseline, ESR is normal at 16.  Assessment: 73 year old man with a recent right frontal ICH presenting with progressive decline in terms of his cognition and awareness as well as difficulty walking and progressive gait instability that has progressed to the point where he is having falls. His symptoms do correlate with his frontal lobe hemorrhage.  He does have the classic findings of NPH but his imaging is not impressive for NPH.  Due to the intermittent nature of some of the symptoms, seizures should be considered. Vasculitis is lower on the differential but the inflammatory markers and CTA do not support that diagnosis.. There is a small atherosclerotic web in the right ICA without stenosis which is new compared to the prior study.  Stable ascending aortic aneurysm. Renal function is mildly deranged.  Impression:  -Possible behavioral/personality changes secondary to frontal lobe hematoma -Evaluate for seizures -Evaluate for toxic metabolic encephalopathy -History of ICH-no acute stroke noted on this MRI.  Not on anticoagulation due to the recent ICH.  Recommendations: Correction of toxic metabolic derangements per primary team as you are Continue Keppra. Can consider MRI with contrast for more detailed information but unclear if that would be of high yield, given his deranged renal function, will hold off on the MRI with contrast. Continue IV hydration We will consider alternative antiepileptic as Keppra can sometimes cause behavioral changes in the elderly. I would also recommend considering a psychiatry consultation as an outpatient. We will need physical therapy occupational therapy and speech therapy and possible extended rehabilitation prior to return home  I discussed the plan in detail with the wife  I will follow with you as test results become available.  -- Amie [place], MD [facility] Pager: [phone] If 7pm to 7am, please call on call as listed on AMION.   ADDENDUM EEG - normal.  -- Amie [place], MD [facility] Pager: [phone] If 7pm to 7am, please call on call as listed on AMION.

## 2018-11-22 NOTE — ED Provider Notes (Signed)
TIME SEEN: 1:53 AM  CHIEF COMPLAINT: Confusion, gait abnormalities  HPI: Patient is a 73 year old male with history of cardiomyopathy, COPD, chronic kidney disease, hypertension, hyperlipidemia, atrial fibrillation previously on Eliquis, history of right frontal lobe hemorrhagic stroke on 10/21/2018 who presents to the emergency department with his wife for concerns for confusion and abnormal gait.  She states since the stroke he seems to be progressively worsening.  She states that he has had increased confusion over the past 2 weeks that has significantly progressed in the past 48 hours.  She also reports that he is now having a ataxic gait and he is falling into walls.  She states that has been present over 24 hours.  He has not fallen or hit his head.  She states that normally he does not use a cane or walker to ambulate.  He has been getting physical therapy and Occupational Therapy.  They called their primary care doctor Dr. Elease Hashimoto who recommended they come to the emergency department.  They have a follow-up appointment with [facility] neurology on 11/25/2018.  She denies that he has had any fevers, cough, vomiting or diarrhea.  No changes in his medications.  Wife reports that patient has been confused and acting abnormally.  She states that he was up at 7 AM changing light bulbs that did not need to be changed in the house.  She states that he thought he had an appointment today when he did not.  She states that he does not recognize her mother who is been living with him for 5 years.  Wife reports that they came back to the emergency department on August 7 for concerns for confusion at that time had a normal work-up with normal urine and a repeat MRI that showed no acute abnormality.  ROS: Level 5 caveat secondary to altered mental status  PAST MEDICAL HISTORY/PAST SURGICAL HISTORY:  Past Medical History:  Diagnosis Date  . Allergic rhinitis   . Anticoagulant long-term use    eliquis  .  Cardiomyopathy due to systemic disease [facility])    followed by dr harding  . COPD with emphysema [facility])    pulmologist-  dr Halford Chessman  . Dyspnea    occasional per pt  . History of colon polyps    tubular adenoma 2013  . History of gout    09-05-2017 last flare-up  05/ 2019 3 wks ago, feet  . History of sepsis 02/18/2017   per d/c note probable uti, acute chf, acute renal failure, hypoxia  . Hyperlipidemia   . Hyperplasia of prostate with lower urinary tract symptoms (LUTS)   . Hypertension   . OSA on CPAP    per study 08-03-2004  Severe OSA  . Persistent atrial fibrillation    cardiologist --  dr Dorris Carnes--  post cardioversion 05-07-2014  . Prostate cancer [facility]) urologsit-  dr Alyson Ingles--- as of 05-21-2017 per pt last PSA 11 approx.   Dx  2014--  stage T1c, Gleason 3+3=6, PSA 6.67--  Active surveillance/  04/ 2019  Stage T1b, Gleason 3+4, PSA 12.8- plan external radiation therapy    . 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 ([place])    pulmologist-  dr Halford Chessman  . Seasonal allergies   . Sigmoid diverticulosis   . Systolic and diastolic CHF, chronic [facility])    cardiologist-  [name]  . Tinea versicolor   . Type 2 diabetes mellitus ([place])   . Wears hearing aid in both ears     MEDICATIONS:  Prior  to Admission medications   Medication Sig Start Date End Date Taking? Authorizing Provider  albuterol (VENTOLIN HFA) 108 (90 Base) MCG/ACT inhaler Inhale 2 puffs into the lungs every 6 (six) hours as needed for wheezing or shortness of breath. 03/12/18   Sood, Vineet, MD  amiodarone (PACERONE) 200 MG tablet Take 1 tablet (200 mg total) by mouth daily. 10/27/18   Harding, David W, MD  aspirin EC 81 MG EC tablet Take 1 tablet (81 mg total) by mouth daily. 10/28/18   Biby, Sharon L, NP  atorvastatin (LIPITOR) 10 MG tablet TAKE 1 TABLET BY MOUTH DAILY EACH EVENING Patient taking differently: Take 10 mg by mouth at bedtime.  06/26/18   Burchette, Bruce W, MD  azelastine (ASTELIN) 0.1 %  nasal spray Place 1 spray into both nostrils 2 (two) times daily as needed for rhinitis or allergies. 10/27/18   Biby, Sharon L, NP  Blood Glucose Monitoring Suppl (ACCU-CHEK AVIVA PLUS) w/Device KIT Use blood glucose machine as instructed on your strips 04/22/18   Burchette, Bruce W, MD  budesonide-formoterol (SYMBICORT) 160-4.5 MCG/ACT inhaler Inhale 2 puffs into the lungs 2 (two) times daily. 03/26/18   Sood, Vineet, MD  butalbital-acetaminophen-caffeine (FIORICET) 50-325-40 MG tablet Take 1 tablet by mouth every 8 (eight) hours as needed for headache. 10/27/18   Biby, Sharon L, NP  carvedilol (COREG) 12.5 MG tablet TAKE 1 TABLET (12.5 MG TOTAL) BY MOUTH 2 (TWO) TIMES DAILY. 08/08/18 11/06/18  Harding, David W, MD  colchicine 0.6 MG tablet Take 1-2 tablets (0.6-1.2 mg total) by mouth See admin instructions. Take 1.2 mg by mouth at onset of gout flare, then 0.6 mg two times a day as needed for flare(s) 10/27/18   Biby, Sharon L, NP  furosemide (LASIX) 40 MG tablet TAKE 1-2 TABLETS DAILY AS NEEDED. 10/27/18   Biby, Sharon L, NP  glucose blood (ACCU-CHEK AVIVA PLUS) test strip Test 2 times daily dx e11.9 08/20/18   Burchette, Bruce W, MD  KLOR-CON M20 20 MEQ tablet TAKE 2 TABLETS BY MOUTH EVERY DAY Patient taking differently: Take 40 mEq by mouth daily.  10/21/18   Burchette, Bruce W, MD  Lancets (ACCU-CHEK SOFT TOUCH) lancets Check blood sugars once per day. DX: E11.9 10/24/15   Burchette, Bruce W, MD  levETIRAcetam (KEPPRA) 500 MG tablet Take 1 tablet (500 mg total) by mouth 2 (two) times daily. 10/27/18   Biby, Sharon L, NP  losartan (COZAAR) 25 MG tablet TAKE 1 TABLET BY MOUTH EVERY DAY Patient taking differently: Take 25 mg by mouth daily.  02/03/18   Harding, David W, MD  metFORMIN (GLUCOPHAGE) 500 MG tablet Take 2 tablets (1,000 mg total) by mouth 2 (two) times daily with a meal. 10/14/18   Burchette, Bruce W, MD  metoprolol tartrate (LOPRESSOR) 25 MG tablet Take  1 tablet if  you have breakthrough atrial  fib with extra dose of 200 mg Amiodarone. Patient taking differently: Take 25 mg by mouth See admin instructions. Take 25 mg by mouth for breakthrough A-Fib (with an extra dose of 200 mg Amiodarone) 07/30/17   Harding, David W, MD  montelukast (SINGULAIR) 10 MG tablet Take 1 tablet (10 mg total) by mouth every evening. 10/27/18   Biby, Sharon L, NP  nicotine (NICODERM CQ - DOSED IN MG/24 HOURS) 14 mg/24hr patch Place 1 patch (14 mg total) onto the skin daily. 10/28/18   Biby, Sharon L, NP  PRESCRIPTION MEDICATION CPAP- At bedtime    [provider]  Semaglutide,0.25 or 0.5MG/DOS, (  OZEMPIC, 0.25 OR 0.5 MG/DOSE,) 2 MG/1.5ML SOPN Inject 0.5 mg into the skin every Sunday. 11/02/18   Biby, Sharon L, NP  triamcinolone cream (KENALOG) 0.1 % Apply 1 application topically daily as needed (to affected sites).     [provider]    ALLERGIES:  No Known Allergies  SOCIAL HISTORY:  Social History   Tobacco Use  . Smoking status: Current Every Day Smoker    Packs/day: 0.25    Years: 60.00    Pack years: 15.00    Types: Cigarettes    Last attempt to quit: 04/11/2018    Years since quitting: 0.6  . Smokeless tobacco: Never Used  . Tobacco comment: 09-05-2017 tried quitting smoking 11/ 2018 but started back at 0.75ppd from 1ppd  Substance Use Topics  . Alcohol use: Yes    Alcohol/week: 14.0 standard drinks    Types: 14 Standard drinks or equivalent per week    Comment: 2 drinks daily    FAMILY HISTORY: Family History  Problem Relation Age of Onset  . Breast cancer Mother   . Stroke Other        Unknown   . Ovarian cancer Sister   . Colon cancer Neg Hx   . Esophageal cancer Neg Hx   . Rectal cancer Neg Hx   . Stomach cancer Neg Hx     EXAM: BP (!) 164/94   Pulse 76   Temp 97.9 F (36.6 C) (Oral)   Resp 20   SpO2 98%  CONSTITUTIONAL: Alert and oriented x 3 and responds appropriately to most questions. Well-appearing; well-nourished.  Elderly.  In no distress.  Smiling  inappropriately.  Has a difficult time understanding some commands. HEAD: Normocephalic, atraumatic EYES: Conjunctivae clear, pupils appear equal, EOMI ENT: normal nose; moist mucous membranes NECK: Supple, no meningismus, no nuchal rigidity, no LAD  CARD: RRR; S1 and S2 appreciated; no murmurs, no clicks, no rubs, no gallops RESP: Normal chest excursion without splinting or tachypnea; breath sounds clear and equal bilaterally; no wheezes, no rhonchi, no rales, no hypoxia or respiratory distress, speaking full sentences ABD/GI: Normal bowel sounds; non-distended; soft, non-tender, no rebound, no guarding, no peritoneal signs, no hepatosplenomegaly BACK:  The back appears normal and is non-tender to palpation, there is no CVA tenderness EXT: Normal ROM in all joints; non-tender to palpation; no edema; normal capillary refill; no cyanosis, no calf tenderness or swelling    SKIN: Normal color for age and race; warm; no rash NEURO: Moves all extremities equally, unable to test sensation given patient does not seem to understand the question, no drift, cranial nerves II through XII intact, normal speech, patient does initially have some dysmetria to finger-nose testing bilaterally that improves with repeat testing PSYCH: The patient's mood and manner are appropriate. Grooming and personal hygiene are appropriate.  MEDICAL DECISION MAKING: Patient here with increasing confusion and ataxia.  Wife reports confusion for the past 2 weeks but has escalated in the past 48 hours with ataxic gait in the past 24 hours.  He has no obvious focal neurologic deficit on my exam but does seem to have some inappropriate behavior which I think can be explained by his recent right-sided frontal lobe hemorrhage.  The ataxic gait however is something that is new and concerning for his wife.  Head CT here shows no acute abnormality.  Labs show slight elevation of creatinine from his baseline.  Will give IV fluids.  We will need  to obtain a urine sample.  Will   discuss with neurology for recommendations.  I feel patient may need admission to the hospital and placement in a rehab facility.  ED PROGRESS: 1:50 AM  Spoke with Dr. Cheral Marker with neurology.  Appreciate his help.  He will see patient in consultation.   Patient was able to ambulate to the bathroom on his own.  He does have an unsteady gait but is not ataxic.  Patient is very confused.  He has urinated in the floor and has gotten stool all over the bathroom.  He is a difficult time getting back to the bed without assistance.  He is a high fall risk.  Neurology has seen the patient and agrees with medicine admission and has ordered a CTA of the head and neck.  Dr. Cheral Marker has recommended continuing Keppra and obtaining an EEG in the morning.  Recommends MRI of the brain with contrast once patient's GFR is above 50. It is currently 41.  He is getting 1 L of IV fluids currently.  Recommends ESR and CRP.  States this could be CNS vasculitis.   Appreciate neurology's help.   3:12 AM Discussed patient's case with hospitalist, Dr. Myna Hidalgo.  I have recommended admission and patient (and family if present) agree with this plan. Admitting physician will place admission orders.   I reviewed all nursing notes, vitals, pertinent previous records, EKGs, lab and urine results, imaging (as available).     EKG Interpretation  Date/Time:  Friday November 21 2018 16:24:54 EDT Ventricular Rate:  79 PR Interval:  160 QRS Duration: 80 QT Interval:  380 QTC Calculation: 435 R Axis:   -91 Text Interpretation:  Normal sinus rhythm Right superior axis deviation Inferior infarct , age undetermined Cannot rule out Anterior infarct , age undetermined Abnormal ECG No significant change since last tracing Confirmed by Pryor Curia [phone]) on 11/22/2018 12:07:04 AM        CRITICAL CARE Performed by: Cyril Mourning Ewell Benassi   Total critical care time: 45 minutes  Critical care time was exclusive of  separately billable procedures and treating other patients.  Critical care was necessary to treat or prevent imminent or life-threatening deterioration.  Critical care was time spent personally by me on the following activities: development of treatment plan with patient and/or surrogate as well as nursing, discussions with consultants, evaluation of patient's response to treatment, examination of patient, obtaining history from patient or surrogate, ordering and performing treatments and interventions, ordering and review of laboratory studies, ordering and review of radiographic studies, pulse oximetry and re-evaluation of patient's condition.    Shelbi Vaccaro, Delice Bison, DO 11/22/18 [phone]

## 2018-11-22 NOTE — Progress Notes (Signed)
Pt c/o headache. [place] ordered. Will follow   Also wife complains of him being more agitated and combative, which is not like his usual self.  Keppra might be contributing.  Will change to depakote.   MRI might not be possible tonight.   -- Amie Portland, MD [facility] Pager: [phone] If 7pm to 7am, please call on call as listed on AMION.

## 2018-11-22 NOTE — Progress Notes (Signed)
EEG complete - results pending 

## 2018-11-22 NOTE — Progress Notes (Signed)
RT unable to place pt on cpap due to pt recently vomiting.

## 2018-11-22 NOTE — Progress Notes (Signed)
Pt came down to MRI trying to pull catheter and IV out, nurse gave multiple meds to try and relax patient, pt got sick on himself. We could not get any images, Dr. Rory Percy said to send pt to CT and we can attempt MRI when pt is in better condition. MRI contrast portion very important to get for this pt.

## 2018-11-22 NOTE — Procedures (Addendum)
ELECTROENCEPHALOGRAM REPORT   Patient: Ronald [name]       Room #: 952W EEG No. ID: [id number] Age: 73 y.o.        Sex: male Referring Physician: Tawanna Solo Report Date:  11/22/2018        Interpreting Physician: Alexis Goodell  History: [name] is an 73 y.o. male history of [place] presenting with cognitive decline and gait instability  Medications:  Pacerone, Lipitor, Coreg, Insulin, Keppra, Dulera  Conditions of Recording:  This is a 21 channel routine scalp EEG performed with bipolar and monopolar montages arranged in accordance to the international 10/20 system of electrode placement. One channel was dedicated to EKG recording.  The patient is in the awake, drowsy and asleep states.  Description:  The waking background activity consists of a low voltage, symmetrical, fairly well organized, 8 Hz alpha activity, seen from the parieto-occipital and posterior temporal regions.  Low voltage fast activity, poorly organized, is seen anteriorly and is at times superimposed on more posterior regions.  A mixture of theta and alpha rhythms are seen from the central and temporal regions. The patient drowses with slowing to irregular, low voltage theta and beta activity.   The patient goes in to a light sleep with symmetrical sleep spindles, vertex central sharp transients and irregular slow activity.   No epileptiform activity is noted.   Hyperventilation and ntermittent photic stimulation were not performed.   IMPRESSION: Normal electroencephalogram, awake and asleep. There are no focal lateralizing or epileptiform features.   Alexis Goodell, MD Neurology [phone] 11/22/2018, 1:11 PM

## 2018-11-23 ENCOUNTER — Observation Stay (HOSPITAL_COMMUNITY): Payer: Medicare Other

## 2018-11-23 DIAGNOSIS — I4819 Other persistent atrial fibrillation: Secondary | ICD-10-CM | POA: Diagnosis present

## 2018-11-23 DIAGNOSIS — Z8041 Family history of malignant neoplasm of ovary: Secondary | ICD-10-CM | POA: Diagnosis not present

## 2018-11-23 DIAGNOSIS — Y929 Unspecified place or not applicable: Secondary | ICD-10-CM | POA: Diagnosis not present

## 2018-11-23 DIAGNOSIS — Z86010 Personal history of colonic polyps: Secondary | ICD-10-CM | POA: Diagnosis not present

## 2018-11-23 DIAGNOSIS — I610 Nontraumatic intracerebral hemorrhage in hemisphere, subcortical: Secondary | ICD-10-CM | POA: Diagnosis not present

## 2018-11-23 DIAGNOSIS — R52 Pain, unspecified: Secondary | ICD-10-CM | POA: Diagnosis not present

## 2018-11-23 DIAGNOSIS — M1009 Idiopathic gout, multiple sites: Secondary | ICD-10-CM | POA: Diagnosis not present

## 2018-11-23 DIAGNOSIS — Z79899 Other long term (current) drug therapy: Secondary | ICD-10-CM | POA: Diagnosis not present

## 2018-11-23 DIAGNOSIS — I429 Cardiomyopathy, unspecified: Secondary | ICD-10-CM | POA: Diagnosis present

## 2018-11-23 DIAGNOSIS — Y92239 Unspecified place in hospital as the place of occurrence of the external cause: Secondary | ICD-10-CM | POA: Diagnosis not present

## 2018-11-23 DIAGNOSIS — I619 Nontraumatic intracerebral hemorrhage, unspecified: Secondary | ICD-10-CM | POA: Diagnosis not present

## 2018-11-23 DIAGNOSIS — R569 Unspecified convulsions: Secondary | ICD-10-CM | POA: Diagnosis not present

## 2018-11-23 DIAGNOSIS — I130 Hypertensive heart and chronic kidney disease with heart failure and stage 1 through stage 4 chronic kidney disease, or unspecified chronic kidney disease: Secondary | ICD-10-CM | POA: Diagnosis present

## 2018-11-23 DIAGNOSIS — I480 Paroxysmal atrial fibrillation: Secondary | ICD-10-CM | POA: Diagnosis not present

## 2018-11-23 DIAGNOSIS — Z974 Presence of external hearing-aid: Secondary | ICD-10-CM | POA: Diagnosis not present

## 2018-11-23 DIAGNOSIS — Z803 Family history of malignant neoplasm of breast: Secondary | ICD-10-CM | POA: Diagnosis not present

## 2018-11-23 DIAGNOSIS — A499 Bacterial infection, unspecified: Secondary | ICD-10-CM | POA: Diagnosis not present

## 2018-11-23 DIAGNOSIS — R4182 Altered mental status, unspecified: Secondary | ICD-10-CM | POA: Diagnosis present

## 2018-11-23 DIAGNOSIS — G9341 Metabolic encephalopathy: Secondary | ICD-10-CM | POA: Diagnosis present

## 2018-11-23 DIAGNOSIS — E1122 Type 2 diabetes mellitus with diabetic chronic kidney disease: Secondary | ICD-10-CM | POA: Diagnosis present

## 2018-11-23 DIAGNOSIS — I10 Essential (primary) hypertension: Secondary | ICD-10-CM | POA: Diagnosis not present

## 2018-11-23 DIAGNOSIS — I712 Thoracic aortic aneurysm, without rupture: Secondary | ICD-10-CM | POA: Diagnosis present

## 2018-11-23 DIAGNOSIS — S06360A Traumatic hemorrhage of cerebrum, unspecified, without loss of consciousness, initial encounter: Secondary | ICD-10-CM | POA: Diagnosis not present

## 2018-11-23 DIAGNOSIS — N183 Chronic kidney disease, stage 3 (moderate): Secondary | ICD-10-CM | POA: Diagnosis present

## 2018-11-23 DIAGNOSIS — N179 Acute kidney failure, unspecified: Secondary | ICD-10-CM | POA: Diagnosis present

## 2018-11-23 DIAGNOSIS — N390 Urinary tract infection, site not specified: Secondary | ICD-10-CM | POA: Diagnosis present

## 2018-11-23 DIAGNOSIS — Z823 Family history of stroke: Secondary | ICD-10-CM | POA: Diagnosis not present

## 2018-11-23 DIAGNOSIS — I5032 Chronic diastolic (congestive) heart failure: Secondary | ICD-10-CM

## 2018-11-23 DIAGNOSIS — E785 Hyperlipidemia, unspecified: Secondary | ICD-10-CM | POA: Diagnosis present

## 2018-11-23 DIAGNOSIS — G92 Toxic encephalopathy: Secondary | ICD-10-CM | POA: Diagnosis not present

## 2018-11-23 DIAGNOSIS — F05 Delirium due to known physiological condition: Secondary | ICD-10-CM | POA: Diagnosis present

## 2018-11-23 DIAGNOSIS — Z8679 Personal history of other diseases of the circulatory system: Secondary | ICD-10-CM | POA: Diagnosis not present

## 2018-11-23 DIAGNOSIS — T426X5A Adverse effect of other antiepileptic and sedative-hypnotic drugs, initial encounter: Secondary | ICD-10-CM | POA: Diagnosis present

## 2018-11-23 DIAGNOSIS — Z8719 Personal history of other diseases of the digestive system: Secondary | ICD-10-CM | POA: Diagnosis not present

## 2018-11-23 DIAGNOSIS — S069X0S Unspecified intracranial injury without loss of consciousness, sequela: Secondary | ICD-10-CM | POA: Diagnosis not present

## 2018-11-23 DIAGNOSIS — Z7901 Long term (current) use of anticoagulants: Secondary | ICD-10-CM | POA: Diagnosis not present

## 2018-11-23 DIAGNOSIS — Z9842 Cataract extraction status, left eye: Secondary | ICD-10-CM | POA: Diagnosis not present

## 2018-11-23 DIAGNOSIS — Z8546 Personal history of malignant neoplasm of prostate: Secondary | ICD-10-CM | POA: Diagnosis not present

## 2018-11-23 DIAGNOSIS — E1165 Type 2 diabetes mellitus with hyperglycemia: Secondary | ICD-10-CM | POA: Diagnosis present

## 2018-11-23 DIAGNOSIS — F09 Unspecified mental disorder due to known physiological condition: Secondary | ICD-10-CM | POA: Diagnosis not present

## 2018-11-23 DIAGNOSIS — I69098 Other sequelae following nontraumatic subarachnoid hemorrhage: Secondary | ICD-10-CM | POA: Diagnosis not present

## 2018-11-23 DIAGNOSIS — J439 Emphysema, unspecified: Secondary | ICD-10-CM | POA: Diagnosis present

## 2018-11-23 DIAGNOSIS — Z9841 Cataract extraction status, right eye: Secondary | ICD-10-CM | POA: Diagnosis not present

## 2018-11-23 DIAGNOSIS — G40909 Epilepsy, unspecified, not intractable, without status epilepticus: Secondary | ICD-10-CM | POA: Diagnosis present

## 2018-11-23 DIAGNOSIS — E1169 Type 2 diabetes mellitus with other specified complication: Secondary | ICD-10-CM | POA: Diagnosis not present

## 2018-11-23 DIAGNOSIS — M109 Gout, unspecified: Secondary | ICD-10-CM | POA: Diagnosis present

## 2018-11-23 DIAGNOSIS — I5042 Chronic combined systolic (congestive) and diastolic (congestive) heart failure: Secondary | ICD-10-CM | POA: Diagnosis present

## 2018-11-23 DIAGNOSIS — G3189 Other specified degenerative diseases of nervous system: Secondary | ICD-10-CM | POA: Diagnosis not present

## 2018-11-23 DIAGNOSIS — E669 Obesity, unspecified: Secondary | ICD-10-CM | POA: Diagnosis not present

## 2018-11-23 DIAGNOSIS — G9340 Encephalopathy, unspecified: Secondary | ICD-10-CM | POA: Diagnosis present

## 2018-11-23 DIAGNOSIS — Z20828 Contact with and (suspected) exposure to other viral communicable diseases: Secondary | ICD-10-CM | POA: Diagnosis present

## 2018-11-23 DIAGNOSIS — G4733 Obstructive sleep apnea (adult) (pediatric): Secondary | ICD-10-CM | POA: Diagnosis present

## 2018-11-23 DIAGNOSIS — I428 Other cardiomyopathies: Secondary | ICD-10-CM | POA: Diagnosis present

## 2018-11-23 LAB — GLUCOSE, CAPILLARY
Glucose-Capillary: 172 mg/dL — ABNORMAL HIGH (ref 70–99)
Glucose-Capillary: 132 mg/dL — ABNORMAL HIGH (ref 70–99)
Glucose-Capillary: 166 mg/dL — ABNORMAL HIGH (ref 70–99)
Glucose-Capillary: 127 mg/dL — ABNORMAL HIGH (ref 70–99)

## 2018-11-23 LAB — BASIC METABOLIC PANEL
Anion gap: 11 (ref 5–15)
BUN: 12 mg/dL (ref 8–23)
CO2: 24 mmol/L (ref 22–32)
Calcium: 8.6 mg/dL — ABNORMAL LOW (ref 8.9–10.3)
Chloride: 102 mmol/L (ref 98–111)
Creatinine, Ser: 1.31 mg/dL — ABNORMAL HIGH (ref 0.61–1.24)
GFR calc Af Amer: >60 mL/min (ref >60)
GFR calc non Af Amer: 54 mL/min — ABNORMAL LOW (ref >60)
Glucose, Bld: 174 mg/dL — ABNORMAL HIGH (ref 70–99)
Potassium: 4.2 mmol/L (ref 3.5–5.1)
Sodium: 137 mmol/L (ref 135–145)

## 2018-11-23 MED ORDER — QUETIAPINE FUMARATE 25 MG PO TABS
25.0000 mg | ORAL_TABLET | Freq: Every day | ORAL | Status: DC
  Administered 2018-11-23 – 2018-11-26 (×4): 25 mg via ORAL
  Filled 2018-11-23 (×4): qty 1

## 2018-11-23 MED ORDER — GADOBUTROL 1 MMOL/ML IV SOLN
10.0000 mL | Freq: Once | INTRAVENOUS | Status: AC | PRN
  Administered 2018-11-23: 10 mL via INTRAVENOUS

## 2018-11-23 MED ORDER — QUETIAPINE FUMARATE 25 MG PO TABS
12.5000 mg | ORAL_TABLET | Freq: Once | ORAL | Status: AC
  Administered 2018-11-26: 12.5 mg via ORAL
  Filled 2018-11-23: qty 1

## 2018-11-23 NOTE — Progress Notes (Signed)
PROGRESS NOTE    Ronald [name]  KDX:[id number] DOB: 21-Aug-1945 DOA: 11/21/2018 PCP: [name], MD   Brief Narrative:  Patient is a 73 year old male with history of recent right frontal ICH, A. fib not on anticoagulation, hypertension, chronic diastolic CHF, COPD, CKD stage III, diabetes type 2, seizure disorder who presents from home for evaluation of worsening confusion and gait problem.  Neurology has been consulted and following. There was also report of one seizure episode.  CT imaging of the brain did not show any acute intracranial abnormality and  shows evolution of right frontal intraparenchymal hemorrhage. Patient admitted for the evaluation of altered mental status. Plan is to MRI when possible.  MRI attempted yesterday but not successful due to agitation.  Neurology following.  Assessment & Plan:   Principal Problem:   Acute encephalopathy Active Problems:   Obstructive sleep apnea   Essential hypertension   Type 2 diabetes mellitus with hyperglycemia (HCC)   Paroxysmal atrial fibrillation (HCC)   Chronic diastolic CHF (congestive heart failure) (HCC)   ICH (intracerebral hemorrhage) (HCC) w/ SAH while on Eliquis   Seizures ([place]), secondsry to [place]   CKD (chronic kidney disease), stage III ([place])   Thoracic ascending aortic aneurysm (HCC)   Altered mental status: Suspected to be from [place] versus behavioral/personality changes secondary to frontal lobe hematoma.  Keppra has been discontinued.  Started on Depakote. This morning he remains drowsy sleepy due to the effect of Ativan that was given last night for agitation.  When asked, he says he is fine but fell back to sleep instantly. We will continue to monitor mental status.  Plan is to get MRI of brain with contrast when possible. EEG did not show any epileptiform activities. CT done last night did not show any acute intracranial abnormality and shows evaluation of right frontal intraparenchymal hemorrhage. He  does not have any clinical signs of CNS infection.  Recent intracerebral hemorrhage: Currently stable.  CT finding as above.  Seizures: Reported of an episode of seizure recently.  Was on Keppra at home.  Keppra changed to Depakote.  Continue seizure precaution.  Paroxysmal A. fib: Currently in sinus rhythm.CHADS-VASc 7.  Not on anticoagulation due to history of ICH.  Continue amiodarone.  Rate is well controlled.  Hypertension: Currently blood stable.  Continue to monitor blood pressure.  Continue current medicines  Type 2 diabetes mellitus: Hemoglobin A1c 6.9 in July 2020.  On metformin, Ozempic at home .Continue sliding scale insulin here.  Chronic diastolic CHF: Currently compensated.  Lasix on hold due to AKI.  Continue gentle IV fluids  OSA: Continue CPAP at night.  History of ascending thoracic aortic aneurysm:  Imaging showed  4.1 cm noted incidentally on CTA neck in ED.  Follow-up imaging recommended as an outpatient.  Debility/deconditioning: PT/OT evaluation requested.          DVT prophylaxis: SCD Code Status: Full Family Communication: Wife at bedside Disposition Plan: Home versus skilled nursing facility as per PT evaluation when medically stable  Consultants: Neurology  Procedures: EEG  Antimicrobials:  Anti-infectives (From admission, onward)   None      Subjective:  Patient seen and examined at bedside this morning.  Hemodynamically stable.  Reported to have agitated last night.  Was given Ativan so was sleepy this morning.  Woke up on calling his name and said he is fine but did not participate with communication and instantly fell back to sleep.   Objective: Vitals:   11/23/18 0400 11/23/18 0430  11/23/18 0700 11/23/18 0851  BP: (!) 167/106 (!) 135/59 130/60   Pulse:  66  80  Resp:  18 18 18   Temp:  97.7 F (36.5 C) 98.1 F (36.7 C)   TempSrc:  Axillary Axillary   SpO2:  98% 100% 91%    Intake/Output Summary (Last 24 hours) at 11/23/2018  1020 Last data filed at 11/23/2018 0600 Gross per 24 hour  Intake 846.68 ml  Output --  Net 846.68 ml   There were no vitals filed for this visit.  Examination:  General exam: Sleepy, drowsy,Not in distress,average built HEENT:PERRL,Oral mucosa moist, Ear/Nose normal on gross exam Respiratory system: Bilateral equal air entry, normal vesicular breath sounds, no wheezes or crackles  Cardiovascular system: S1 & S2 heard, RRR. No JVD, murmurs, rubs, gallops or clicks. No pedal edema. Gastrointestinal system: Abdomen is nondistended, soft and nontender. No organomegaly or masses felt. Normal bowel sounds heard. Central nervous system: Sleepy, drowsy, no focal neuro logical deficits extremities: No edema, no clubbing ,no cyanosis, distal peripheral pulses palpable. Skin: No rashes, lesions or ulcers,no icterus ,no pallor MSK: Normal muscle bulk,tone ,power   Data Reviewed: I have personally reviewed following labs and imaging studies  CBC: Recent Labs  Lab 11/21/18 1644 11/22/18 1332  WBC 5.2 4.7  NEUTROABS 3.4  --   HGB 14.8 14.2  HCT 43.1 42.5  MCV 98.0 98.4  PLT 207 948   Basic Metabolic Panel: Recent Labs  Lab 11/21/18 1644 11/22/18 1332 11/23/18 0448  NA 137 140 137  K 4.4 4.6 4.2  CL 104 106 102  CO2 22 23 24   GLUCOSE 135* 134* 174*  BUN 18 16 12   CREATININE 1.62* 1.24 1.31*  CALCIUM 9.3 9.1 8.6*   GFR: Estimated Creatinine Clearance: 61.3 mL/min (A) (by C-G formula based on SCr of 1.31 mg/dL (H)). Liver Function Tests: Recent Labs  Lab 11/21/18 1644  AST 18  ALT 21  ALKPHOS 82  BILITOT 0.8  PROT 6.5  ALBUMIN 3.8   No results for input(s): LIPASE, AMYLASE in the last 168 hours. No results for input(s): AMMONIA in the last 168 hours. Coagulation Profile: Recent Labs  Lab 11/21/18 1644  INR 1.0   Cardiac Enzymes: No results for input(s): CKTOTAL, CKMB, CKMBINDEX, TROPONINI in the last 168 hours. BNP (last 3 results) No results for input(s):  PROBNP in the last 8760 hours. HbA1C: No results for input(s): HGBA1C in the last 72 hours. CBG: Recent Labs  Lab 11/22/18 1127 11/22/18 2119 11/23/18 0609  GLUCAP 130* 173* 172*   Lipid Profile: No results for input(s): CHOL, HDL, LDLCALC, TRIG, CHOLHDL, LDLDIRECT in the last 72 hours. Thyroid Function Tests: No results for input(s): TSH, T4TOTAL, FREET4, T3FREE, THYROIDAB in the last 72 hours. Anemia Panel: No results for input(s): VITAMINB12, FOLATE, FERRITIN, TIBC, IRON, RETICCTPCT in the last 72 hours. Sepsis Labs: No results for input(s): PROCALCITON, LATICACIDVEN in the last 168 hours.  Recent Results (from the past 240 hour(s))  SARS CORONAVIRUS 2 Nasal Swab Aptima Multi Swab     Status: None   Collection Time: 11/22/18  4:58 AM   Specimen: Aptima Multi Swab; Nasal Swab  Result Value Ref Range Status   SARS Coronavirus 2 NEGATIVE NEGATIVE Final    Comment: (NOTE) SARS-CoV-2 target nucleic acids are NOT DETECTED. The SARS-CoV-2 RNA is generally detectable in upper and lower respiratory specimens during the acute phase of infection. Negative results do not preclude SARS-CoV-2 infection, do not rule out co-infections with other pathogens,  and should not be used as the sole basis for treatment or other patient management decisions. Negative results must be combined with clinical observations, patient history, and epidemiological information. The expected result is Negative. Fact Sheet for Patients: SugarRoll.be Fact Sheet for Healthcare Providers: https://www.woods-mathews.com/ This test is not yet approved or cleared by the [place] FDA and  has been authorized for detection and/or diagnosis of SARS-CoV-2 by FDA under an Emergency Use Authorization (EUA). This EUA will remain  in effect (meaning this test can be used) for the duration of the COVID-19 declaration under Section 56 4(b)(1) of the Act, 21 U.S.C. section  360bbb-3(b)(1), unless the authorization is terminated or revoked sooner. Performed at [facility], [place] [address], [place], [place] [address]          Radiology Studies: Ct Angio Head W Or Wo Contrast  Result Date: 11/22/2018 CLINICAL DATA:  Possible vasculitis. EXAM: CT ANGIOGRAPHY HEAD AND NECK TECHNIQUE: Multidetector CT imaging of the head and neck was performed using the standard protocol during bolus administration of intravenous contrast. Multiplanar CT image reconstructions and MIPs were obtained to evaluate the vascular anatomy. Carotid stenosis measurements (when applicable) are obtained utilizing NASCET criteria, using the distal internal carotid diameter as the denominator. CONTRAST:  164mL OMNIPAQUE IOHEXOL 350 MG/ML SOLN COMPARISON:  CTA head neck 11/21/2018 FINDINGS: CTA NECK FINDINGS SKELETON: Multilevel degenerative disc disease and facet hypertrophy without bony spinal canal stenosis. OTHER NECK: Normal pharynx, larynx and major salivary glands. No cervical lymphadenopathy. Unremarkable thyroid gland. UPPER CHEST: No pneumothorax or pleural effusion. No nodules or masses. AORTIC ARCH: There is mild calcific atherosclerosis of the aortic arch. The ascending thoracic aorta measures 4.1 cm in diameter. Normal variant aortic arch branching pattern with the brachiocephalic and left common carotid arteries sharing a common origin. The visualized proximal subclavian arteries are widely patent. RIGHT CAROTID SYSTEM: --Common carotid artery: There is a small atherosclerotic web extending from the right carotid bifurcation into the proximal right internal carotid artery --Internal carotid artery: Atherosclerotic web extends into the proximal ICA but does not cause stenosis. Remainder of the artery is normal. --External carotid artery: No acute abnormality. LEFT CAROTID SYSTEM: --Common carotid artery: Widely patent origin without common carotid artery dissection or aneurysm. --Internal  carotid artery: Normal without aneurysm, dissection or stenosis. --External carotid artery: No acute abnormality. VERTEBRAL ARTERIES: Right dominant configuration. Both origins are clearly patent. No dissection, occlusion or flow-limiting stenosis to the skull base (V1-V3 segments). Multifocal atherosclerotic calcification. CTA HEAD FINDINGS POSTERIOR CIRCULATION: --Vertebral arteries: Normal V4 segments. --Posterior inferior cerebellar arteries (PICA): Patent origins from the vertebral arteries. --Anterior inferior cerebellar arteries (AICA): Patent origins from the basilar artery. --Basilar artery: Normal. --Superior cerebellar arteries: Normal. --Posterior cerebral arteries (PCA): Normal. Both originate from the basilar artery. Posterior communicating arteries (p-comm) are diminutive or absent. ANTERIOR CIRCULATION: --Intracranial internal carotid arteries: Atherosclerotic calcification of the internal carotid arteries at the skull base without hemodynamically significant stenosis. --Anterior cerebral arteries (ACA): Normal. Both A1 segments are present. Patent anterior communicating artery (a-comm). --Middle cerebral arteries (MCA): Normal. VENOUS SINUSES: As permitted by contrast timing, patent. ANATOMIC VARIANTS: None Review of the MIP images confirms the above findings. IMPRESSION: 1. No emergent large vessel occlusion or evidence of CNS vasculitis. 2. 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 This is new compared to the prior study. 3. 4.1 cm ascending thoracic aortic aneurysm. Recommend annual imaging followup by CTA or MRA. This recommendation follows 2010 ACCF/AHA/AATS/ACR/ASA/SCA/SCAI/SIR/STS/SVM Guidelines  for the Diagnosis and Management of Patients with Thoracic Aortic Disease. Circulation. 2010; 121: W546-E703. Aortic aneurysm NOS (ICD10-I71.9) Aortic atherosclerosis (ICD10-I70.0). Electronically Signed   By: Ulyses Jarred M.D.   On: 11/22/2018 03:40    Ct Head Wo Contrast  Result Date: 11/22/2018 CLINICAL DATA:  Intracranial hemorrhage, known. Follow-up. EXAM: CT HEAD WITHOUT CONTRAST TECHNIQUE: Contiguous axial images were obtained from the base of the skull through the vertex without intravenous contrast. COMPARISON:  Head CTs dated 11/21/2018 and 11/13/2018. FINDINGS: Brain: Continued expected evolution of the previously demonstrated intraparenchymal hemorrhage within the RIGHT frontal lobe, again less conspicuous than on earlier studies. The associated parenchymal edema within the RIGHT frontal lobe is stable in extent. No associated mass effect or midline shift appreciated. No new parenchymal or extra-axial hemorrhage. No new edema. Ventricles are stable in size and configuration. Vascular: Chronic calcified atherosclerotic changes of the large vessels at the skull base. No unexpected hyperdense vessel. Skull: Normal. Negative for fracture or focal lesion. Sinuses/Orbits: No acute finding. Other: None. IMPRESSION: 1. Continued expected evolution of the previously demonstrated intraparenchymal hemorrhage within the RIGHT frontal lobe, again less conspicuous than on earlier studies. Associated parenchymal edema within the RIGHT frontal lobe is stable in extent. No associated mass effect or midline shift. 2. No new intracranial abnormality. No new or acute intracranial hemorrhage. Electronically Signed   By: Franki Cabot M.D.   On: 11/22/2018 19:54   Ct Head Wo Contrast  Result Date: 11/21/2018 CLINICAL DATA:  73 year old male with increased confusion and memory loss over the past 2 days. Recent right frontal lobe hemorrhage. EXAM: CT HEAD WITHOUT CONTRAST TECHNIQUE: Contiguous axial images were obtained from the base of the skull through the vertex without intravenous contrast. COMPARISON:  Head CT dated 11/14/2018 FINDINGS: Brain: The right frontal intraparenchymal hemorrhage is less conspicuous in keeping with evolution of blood product. There is  associated edema in the right frontal lobe which is similar in size or slightly decreased. There is mild age-related atrophy and chronic microvascular ischemic changes. No new or acute intracranial hemorrhage. No mass effect or midline shift. No extra-axial fluid collection. Vascular: No hyperdense vessel or unexpected calcification. Skull: Normal. Negative for fracture or focal lesion. Sinuses/Orbits: No acute finding. Other: None IMPRESSION: 1. No acute or new intracranial hemorrhage. 2. Decrease in conspicuity of the right frontal hemorrhage and slight decrease in associated edema. Electronically Signed   By: Anner Crete M.D.   On: 11/21/2018 23:10   Ct Angio Neck W Or Wo Contrast  Result Date: 11/22/2018 CLINICAL DATA:  Possible vasculitis. EXAM: CT ANGIOGRAPHY HEAD AND NECK TECHNIQUE: Multidetector CT imaging of the head and neck was performed using the standard protocol during bolus administration of intravenous contrast. Multiplanar CT image reconstructions and MIPs were obtained to evaluate the vascular anatomy. Carotid stenosis measurements (when applicable) are obtained utilizing NASCET criteria, using the distal internal carotid diameter as the denominator. CONTRAST:  133mL OMNIPAQUE IOHEXOL 350 MG/ML SOLN COMPARISON:  CTA head neck 11/21/2018 FINDINGS: CTA NECK FINDINGS SKELETON: Multilevel degenerative disc disease and facet hypertrophy without bony spinal canal stenosis. OTHER NECK: Normal pharynx, larynx and major salivary glands. No cervical lymphadenopathy. Unremarkable thyroid gland. UPPER CHEST: No pneumothorax or pleural effusion. No nodules or masses. AORTIC ARCH: There is mild calcific atherosclerosis of the aortic arch. The ascending thoracic aorta measures 4.1 cm in diameter. Normal variant aortic arch branching pattern with the brachiocephalic and left common carotid arteries sharing a common origin. The visualized proximal subclavian arteries are  widely patent. RIGHT CAROTID  SYSTEM: --Common carotid artery: There is a small atherosclerotic web extending from the right carotid bifurcation into the proximal right internal carotid artery --Internal carotid artery: Atherosclerotic web extends into the proximal ICA but does not cause stenosis. Remainder of the artery is normal. --External carotid artery: No acute abnormality. LEFT CAROTID SYSTEM: --Common carotid artery: Widely patent origin without common carotid artery dissection or aneurysm. --Internal carotid artery: Normal without aneurysm, dissection or stenosis. --External carotid artery: No acute abnormality. VERTEBRAL ARTERIES: Right dominant configuration. Both origins are clearly patent. No dissection, occlusion or flow-limiting stenosis to the skull base (V1-V3 segments). Multifocal atherosclerotic calcification. CTA HEAD FINDINGS POSTERIOR CIRCULATION: --Vertebral arteries: Normal V4 segments. --Posterior inferior cerebellar arteries (PICA): Patent origins from the vertebral arteries. --Anterior inferior cerebellar arteries (AICA): Patent origins from the basilar artery. --Basilar artery: Normal. --Superior cerebellar arteries: Normal. --Posterior cerebral arteries (PCA): Normal. Both originate from the basilar artery. Posterior communicating arteries (p-comm) are diminutive or absent. ANTERIOR CIRCULATION: --Intracranial internal carotid arteries: Atherosclerotic calcification of the internal carotid arteries at the skull base without hemodynamically significant stenosis. --Anterior cerebral arteries (ACA): Normal. Both A1 segments are present. Patent anterior communicating artery (a-comm). --Middle cerebral arteries (MCA): Normal. VENOUS SINUSES: As permitted by contrast timing, patent. ANATOMIC VARIANTS: None Review of the MIP images confirms the above findings. IMPRESSION: 1. No emergent large vessel occlusion or evidence of CNS vasculitis. 2. Atherosclerotic web within the proximal right internal carotid artery without  stenosis. This is new compared to the prior study. 3. 4.1 cm ascending thoracic aortic aneurysm. Recommend annual imaging followup by CTA or MRA. This recommendation follows 2010 ACCF/AHA/AATS/ACR/ASA/SCA/SCAI/SIR/STS/SVM Guidelines for the Diagnosis and Management of Patients with Thoracic Aortic Disease. Circulation. 2010; 121: X480-X655. Aortic aneurysm NOS (ICD10-I71.9) Aortic atherosclerosis (ICD10-I70.0). Electronically Signed   By: Ulyses Jarred M.D.   On: 11/22/2018 03:40        Scheduled Meds:  amiodarone  200 mg Oral Daily   atorvastatin  10 mg Oral QHS   carvedilol  12.5 mg Oral BID WC   insulin aspart  0-5 Units Subcutaneous QHS   insulin aspart  0-9 Units Subcutaneous TID WC   mometasone-formoterol  2 puff Inhalation BID   QUEtiapine  12.5 mg Oral Once   QUEtiapine  25 mg Oral QHS   sodium chloride flush  3 mL Intravenous Once   sodium chloride flush  3 mL Intravenous Q12H   Continuous Infusions:  sodium chloride 75 mL/hr at 11/23/18 0300   valproate sodium       LOS: 0 days    Time spent: 35 mins.More than 50% of that time was spent in counseling and/or coordination of care.      Shelly Coss, MD [facility] Pager [phone]  If 7PM-7AM, please contact night-coverage [email or web address] Password [email or web address] 11/23/2018, 10:20 AM

## 2018-11-23 NOTE — Progress Notes (Signed)
SLP Cancellation Note  Patient Details Name: Ronald [name] MRN: [id number] DOB: 1945/10/11   Cancelled treatment:   Pt passed RN stroke swallow screen.  Per protocol, no SLP swallow eval warranted.  Our service will sign off. D/W RN.        [name], [place] CCC/SLP Acute Rehabilitation Services Office number [phone] Pager [phone]  [name] 11/23/2018, 8:50 AM

## 2018-11-23 NOTE — Progress Notes (Addendum)
NEURO HOSPITALIST PROGRESS NOTE   Subjective: Patient in bed asleep. Easily aroused. Patient with mitten restraints. Per nursing is still very confused at times, tried to get out of bed. Needs sitter for safety.  Exam: Vitals:   11/23/18 0400 11/23/18 0430  BP: (!) 167/106 (!) 135/59  Pulse:  66  Resp:  18  Temp:  97.7 F (36.5 C)  SpO2:  98%    Physical Exam   HEENT-  Normocephalic, no lesions, without obvious abnormality.  Normal external eye and conjunctiva.   Cardiovascular-  pulses palpable throughout   Lungs-  Saturations within normal limits on RA Abdomen- All 4 quadrants palpated and nontender Extremities- Warm, dry and intact Musculoskeletal-no joint tenderness, deformity or swelling Skin-warm and dry, no hyperpigmentation, vitiligo, or suspicious lesions   Neuro:  Mental Status: drowsy, oriented to name/age/month/place, thought content appropriate.  Speech fluent without evidence of aphasia.  Able to follow commands without difficulty. Cranial Nerves: II:  Visual fields grossly normal,  III,IV, VI: ptosis not present, extra-ocular motions intact bilaterally pupils equal, round, reactive to light and accommodation V,VII: smile symmetric, facial light touch sensation normal bilaterally VIII: hearing normal bilaterally IX,X: uvula rises symmetrically XI: bilateral shoulder shrug XII: midline tongue extension Motor: Right : Upper extremity   5/5    Left:     Upper extremity   5/5  Lower extremity   5/5     Lower extremity   5/5 Tone and bulk:normal tone throughout; no atrophy noted Sensory:  light touch intact throughout, bilaterally Cerebellar: No ataxia Gait: deferred    Medications:  Scheduled: . amiodarone  200 mg Oral Daily  . atorvastatin  10 mg Oral QHS  . carvedilol  12.5 mg Oral BID WC  . insulin aspart  0-5 Units Subcutaneous QHS  . insulin aspart  0-9 Units Subcutaneous TID WC  . mometasone-formoterol  2 puff Inhalation BID   . sodium chloride flush  3 mL Intravenous Once  . sodium chloride flush  3 mL Intravenous Q12H   Continuous: . sodium chloride 75 mL/hr at 11/23/18 0300  . valproate sodium     JGG:EZMOQHUTMLYYT **OR** acetaminophen, albuterol, HYDROcodone-acetaminophen, labetalol, ondansetron **OR** ondansetron (ZOFRAN) IV, polyethylene glycol  Pertinent Labs/Diagnostics:   Ct Angio Head W Or Wo Contrast  Result Date: 11/22/2018 CLINICAL DATA:  Possible vasculitis. EXAM: CT ANGIOGRAPHY HEAD AND NECK TECHNIQUE: Multidetector CT imaging of the head and neck was performed using the standard protocol during bolus administration of intravenous contrast. Multiplanar CT image reconstructions and MIPs were obtained to evaluate the vascular anatomy. Carotid stenosis measurements (when applicable) are obtained utilizing NASCET criteria, using the distal internal carotid diameter as the denominator. CONTRAST:  137m OMNIPAQUE IOHEXOL 350 MG/ML SOLN COMPARISON:  CTA head neck 11/21/2018 FINDINGS: CTA NECK FINDINGS SKELETON: Multilevel degenerative disc disease and facet hypertrophy without bony spinal canal stenosis. OTHER NECK: Normal pharynx, larynx and major salivary glands. No cervical lymphadenopathy. Unremarkable thyroid gland. UPPER CHEST: No pneumothorax or pleural effusion. No nodules or masses. AORTIC ARCH: There is mild calcific atherosclerosis of the aortic arch. The ascending thoracic aorta measures 4.1 cm in diameter. Normal variant aortic arch branching pattern with the brachiocephalic and left common carotid arteries sharing a common origin. The visualized proximal subclavian arteries are widely patent. RIGHT CAROTID SYSTEM: --Common carotid artery: There is a small atherosclerotic web extending from the right carotid bifurcation into  the proximal right internal carotid artery --Internal carotid artery: Atherosclerotic web extends into the proximal ICA but does not cause stenosis. Remainder of the artery is  normal. --External carotid artery: No acute abnormality. LEFT CAROTID SYSTEM: --Common carotid artery: Widely patent origin without common carotid artery dissection or aneurysm. --Internal carotid artery: Normal without aneurysm, dissection or stenosis. --External carotid artery: No acute abnormality. VERTEBRAL ARTERIES: Right dominant configuration. Both origins are clearly patent. No dissection, occlusion or flow-limiting stenosis to the skull base (V1-V3 segments). Multifocal atherosclerotic calcification. CTA HEAD FINDINGS POSTERIOR CIRCULATION: --Vertebral arteries: Normal V4 segments. --Posterior inferior cerebellar arteries (PICA): Patent origins from the vertebral arteries. --Anterior inferior cerebellar arteries (AICA): Patent origins from the basilar artery. --Basilar artery: Normal. --Superior cerebellar arteries: Normal. --Posterior cerebral arteries (PCA): Normal. Both originate from the basilar artery. Posterior communicating arteries (p-comm) are diminutive or absent. ANTERIOR CIRCULATION: --Intracranial internal carotid arteries: Atherosclerotic calcification of the internal carotid arteries at the skull base without hemodynamically significant stenosis. --Anterior cerebral arteries (ACA): Normal. Both A1 segments are present. Patent anterior communicating artery (a-comm). --Middle cerebral arteries (MCA): Normal. VENOUS SINUSES: As permitted by contrast timing, patent. ANATOMIC VARIANTS: None Review of the MIP images confirms the above findings. IMPRESSION: 1. No emergent large vessel occlusion or evidence of CNS vasculitis. 2. 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 This is new compared to the prior study. 3. 4.1 cm ascending thoracic aortic aneurysm. Recommend annual imaging followup by CTA or MRA. This recommendation follows 2010 ACCF/AHA/AATS/ACR/ASA/SCA/SCAI/SIR/STS/SVM Guidelines for the Diagnosis and Management of Patients with Thoracic Aortic  Disease. Circulation. 2010; 121: Z610-R604. Aortic aneurysm NOS (ICD10-I71.9) Aortic atherosclerosis (ICD10-I70.0). Electronically Signed   By: Ulyses Jarred M.D.   On: 11/22/2018 03:40   Ct Head Wo Contrast  Result Date: 11/22/2018 CLINICAL DATA:  Intracranial hemorrhage, known. Follow-up. EXAM: CT HEAD WITHOUT CONTRAST TECHNIQUE: Contiguous axial images were obtained from the base of the skull through the vertex without intravenous contrast. COMPARISON:  Head CTs dated 11/21/2018 and 11/13/2018. FINDINGS: Brain: Continued expected evolution of the previously demonstrated intraparenchymal hemorrhage within the RIGHT frontal lobe, again less conspicuous than on earlier studies. The associated parenchymal edema within the RIGHT frontal lobe is stable in extent. No associated mass effect or midline shift appreciated. No new parenchymal or extra-axial hemorrhage. No new edema. Ventricles are stable in size and configuration. Vascular: Chronic calcified atherosclerotic changes of the large vessels at the skull base. No unexpected hyperdense vessel. Skull: Normal. Negative for fracture or focal lesion. Sinuses/Orbits: No acute finding. Other: None. IMPRESSION: 1. Continued expected evolution of the previously demonstrated intraparenchymal hemorrhage within the RIGHT frontal lobe, again less conspicuous than on earlier studies. Associated parenchymal edema within the RIGHT frontal lobe is stable in extent. No associated mass effect or midline shift. 2. No new intracranial abnormality. No new or acute intracranial hemorrhage. Electronically Signed   By: Franki Cabot M.D.   On: 11/22/2018 19:54   Ct Head Wo Contrast  Result Date: 11/21/2018 CLINICAL DATA:  73 year old male with increased confusion and memory loss over the past 2 days. Recent right frontal lobe hemorrhage. EXAM: CT HEAD WITHOUT CONTRAST TECHNIQUE: Contiguous axial images were obtained from the base of the skull through the vertex without intravenous  contrast. COMPARISON:  Head CT dated 11/14/2018 FINDINGS: Brain: The right frontal intraparenchymal hemorrhage is less conspicuous in keeping with evolution of blood product. There is associated edema in the right frontal lobe which is similar in size or slightly decreased.  There is mild age-related atrophy and chronic microvascular ischemic changes. No new or acute intracranial hemorrhage. No mass effect or midline shift. No extra-axial fluid collection. Vascular: No hyperdense vessel or unexpected calcification. Skull: Normal. Negative for fracture or focal lesion. Sinuses/Orbits: No acute finding. Other: None IMPRESSION: 1. No acute or new intracranial hemorrhage. 2. Decrease in conspicuity of the right frontal hemorrhage and slight decrease in associated edema. Electronically Signed   By: Anner Crete M.D.   On: 11/21/2018 23:10   Ct Angio Neck W Or Wo Contrast  Result Date: 11/22/2018 CLINICAL DATA:  Possible vasculitis. EXAM: CT ANGIOGRAPHY HEAD AND NECK TECHNIQUE: Multidetector CT imaging of the head and neck was performed using the standard protocol during bolus administration of intravenous contrast. Multiplanar CT image reconstructions and MIPs were obtained to evaluate the vascular anatomy. Carotid stenosis measurements (when applicable) are obtained utilizing NASCET criteria, using the distal internal carotid diameter as the denominator. CONTRAST:  1108m OMNIPAQUE IOHEXOL 350 MG/ML SOLN COMPARISON:  CTA head neck 11/21/2018 FINDINGS: CTA NECK FINDINGS SKELETON: Multilevel degenerative disc disease and facet hypertrophy without bony spinal canal stenosis. OTHER NECK: Normal pharynx, larynx and major salivary glands. No cervical lymphadenopathy. Unremarkable thyroid gland. UPPER CHEST: No pneumothorax or pleural effusion. No nodules or masses. AORTIC ARCH: There is mild calcific atherosclerosis of the aortic arch. The ascending thoracic aorta measures 4.1 cm in diameter. Normal variant aortic arch  branching pattern with the brachiocephalic and left common carotid arteries sharing a common origin. The visualized proximal subclavian arteries are widely patent. RIGHT CAROTID SYSTEM: --Common carotid artery: There is a small atherosclerotic web extending from the right carotid bifurcation into the proximal right internal carotid artery --Internal carotid artery: Atherosclerotic web extends into the proximal ICA but does not cause stenosis. Remainder of the artery is normal. --External carotid artery: No acute abnormality. LEFT CAROTID SYSTEM: --Common carotid artery: Widely patent origin without common carotid artery dissection or aneurysm. --Internal carotid artery: Normal without aneurysm, dissection or stenosis. --External carotid artery: No acute abnormality. VERTEBRAL ARTERIES: Right dominant configuration. Both origins are clearly patent. No dissection, occlusion or flow-limiting stenosis to the skull base (V1-V3 segments). Multifocal atherosclerotic calcification. CTA HEAD FINDINGS POSTERIOR CIRCULATION: --Vertebral arteries: Normal V4 segments. --Posterior inferior cerebellar arteries (PICA): Patent origins from the vertebral arteries. --Anterior inferior cerebellar arteries (AICA): Patent origins from the basilar artery. --Basilar artery: Normal. --Superior cerebellar arteries: Normal. --Posterior cerebral arteries (PCA): Normal. Both originate from the basilar artery. Posterior communicating arteries (p-comm) are diminutive or absent. ANTERIOR CIRCULATION: --Intracranial internal carotid arteries: Atherosclerotic calcification of the internal carotid arteries at the skull base without hemodynamically significant stenosis. --Anterior cerebral arteries (ACA): Normal. Both A1 segments are present. Patent anterior communicating artery (a-comm). --Middle cerebral arteries (MCA): Normal. VENOUS SINUSES: As permitted by contrast timing, patent. ANATOMIC VARIANTS: None Review of the MIP images confirms the  above findings. IMPRESSION: 1. No emergent large vessel occlusion or evidence of CNS vasculitis. 2. 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 This is new compared to the prior study. 3. 4.1 cm ascending thoracic aortic aneurysm. Recommend annual imaging followup by CTA or MRA. This recommendation follows 2010 ACCF/AHA/AATS/ACR/ASA/SCA/SCAI/SIR/STS/SVM Guidelines for the Diagnosis and Management of Patients with Thoracic Aortic Disease. Circulation. 2010; 121:: [id number] Aortic aneurysm NOS (ICD10-I71.9) Aortic atherosclerosis (ICD10-I70.0). Electronically Signed   By: KUlyses JarredM.D.   On: 11/22/2018 03:40   rEEG 11/22/2018:  Normal electroencephalogram, awake and asleep. There are no focal lateralizing or epileptiform features.  ESR: 16 CRP: 1.4   Assessment:  73 year old male with recent right frontal lobe ICH, presenting with a 2 week history of progressive mental decline, acutely worsened over the past 2 days, in conjunction with new onset of gait instability. Concerned for subclinical seizure. EEG normal. CTH with no new bleed, expected evolution of the recent ICH in rt frontal area. Symptoms concerning at thsi time are more behavioral-could have been due to Keppra side effects.   Recommendations:  -- continue depakote (Keppra d/c'd) -- continue sitter for safety --MRI with contrast if possible   -- continue IV hydration -- add Seroquel 47m nightly. If tolerated well, can do additional 12.5 in the AM. Will try a 12.5 dose now.  JLaurey Morale MSN, NP-C [facility] [phone] Attending neurologist's note to follow    Attending Neurohospitalist Addendum Patient seen and examined with APP/Resident. Agree with the history and physical as documented above. Agree with the plan as documented, which I helped formulate. I have independently reviewed the chart, obtained history, review of systems and examined the patient.I  have personally reviewed pertinent head/neck/spine imaging (CT/MRI). Please feel free to call with any questions. --- AAmie Portland MD [facility] Pager: [phone] If 7pm to 7am, please call on call as listed on AMION.

## 2018-11-23 NOTE — Progress Notes (Signed)
OT Cancellation Note  Patient Details Name: Ronald [name] MRN: [id number] DOB: [birth year]   Cancelled Treatment:    Reason Eval/Treat Not Completed: Fatigue/lethargy limiting ability to participate . Pt has been confused and combative and was sedated. He is resting at this time and very lethargic. Will check back as appropriate for evaluation.   [place] A Olivene Cookston 11/23/2018, 9:02 AM

## 2018-11-23 NOTE — Progress Notes (Signed)
PT Cancellation Note  Patient Details Name: Ronald [name] MRN: [id number] DOB: November 22, 1945   Cancelled Treatment:    Reason Eval/Treat Not Completed: Medical issues which prohibited therapy. Pt currently sedated due to confusion/combative.  Sleeping soundly. Wife present in room and in agreement with therapy to hold evals until pt able to better participate. She reports pt had OPPT 8/12, ambulatory at that time with unsteady gait. On 8/14, he was unable to ambulate even with RW and showed progressive confusion. PT to re-attempt eval tomorrow.   Lorriane Shire 11/23/2018, 9:37 AM  Lorrin Goodell, PT  Office # [phone] Pager [phone]

## 2018-11-24 ENCOUNTER — Telehealth: Payer: Self-pay | Admitting: Family Medicine

## 2018-11-24 LAB — GLUCOSE, CAPILLARY
Glucose-Capillary: 110 mg/dL — ABNORMAL HIGH (ref 70–99)
Glucose-Capillary: 171 mg/dL — ABNORMAL HIGH (ref 70–99)
Glucose-Capillary: 114 mg/dL — ABNORMAL HIGH (ref 70–99)
Glucose-Capillary: 156 mg/dL — ABNORMAL HIGH (ref 70–99)

## 2018-11-24 LAB — BASIC METABOLIC PANEL
Anion gap: 8 (ref 5–15)
BUN: 10 mg/dL (ref 8–23)
CO2: 24 mmol/L (ref 22–32)
Calcium: 8.4 mg/dL — ABNORMAL LOW (ref 8.9–10.3)
Chloride: 106 mmol/L (ref 98–111)
Creatinine, Ser: 1.17 mg/dL (ref 0.61–1.24)
GFR calc Af Amer: >60 mL/min (ref >60)
GFR calc non Af Amer: >60 mL/min (ref >60)
Glucose, Bld: 126 mg/dL — ABNORMAL HIGH (ref 70–99)
Potassium: 3.5 mmol/L (ref 3.5–5.1)
Sodium: 138 mmol/L (ref 135–145)

## 2018-11-24 MED ORDER — LORAZEPAM 2 MG/ML IJ SOLN
0.5000 mg | Freq: Once | INTRAMUSCULAR | Status: AC
  Administered 2018-11-24: 0.5 mg via INTRAVENOUS
  Filled 2018-11-24: qty 1

## 2018-11-24 NOTE — Progress Notes (Addendum)
NEUROLOGY PROGRESS NOTE  Subjective: Patient is up reading the newspaper, he is alert that he is in [place], [place], the hospital, is 2020.  His insight of why he came is a little fuzzy but he does know that his wife was the one that decided to bring him in.  There is no further sitters at bedside.  Exam: Vitals:   11/24/18 0319 11/24/18 0700  BP: (!) 173/99 (!) 143/131  Pulse: 61 81  Resp: 16 18  Temp: 98.4 F (36.9 C) 98.3 F (36.8 C)  SpO2: 94% 95%    Physical Exam   HEENT-  Normocephalic, no lesions, without obvious abnormality.  Normal external eye and conjunctiva.   Extremities- Warm, dry and intact Musculoskeletal-no joint tenderness, deformity or swelling Skin-warm and dry, no hyperpigmentation, vitiligo, or suspicious lesions   Neuro:  Mental Status: Alert, oriented, thought content appropriate.  Speech fluent without evidence of aphasia.  Able to follow 3 step commands without difficulty. Cranial Nerves: II:  Visual fields grossly normal,  III,IV, VI: ptosis not present, extra-ocular motions intact bilaterally pupils equal, round, reactive to light and accommodation V,VII: smile symmetric, facial light touch sensation normal bilaterally VIII: hearing normal bilaterally IX,X: Palate rises midline XI: bilateral shoulder shrug XII: midline tongue extension Motor: Right : Upper extremity   5/5    Left:     Upper extremity   5/5  Lower extremity   5/5     Lower extremity   5/5 Tone and bulk:normal tone throughout; no atrophy noted Sensory: Pinprick and light touch intact throughout, bilaterally Deep Tendon Reflexes: 2+ and symmetric throughout Plantars: Right: downgoing   Left: downgoing     Medications:  Scheduled: . amiodarone  200 mg Oral Daily  . atorvastatin  10 mg Oral QHS  . carvedilol  12.5 mg Oral BID WC  . insulin aspart  0-5 Units Subcutaneous QHS  . insulin aspart  0-9 Units Subcutaneous TID WC  . mometasone-formoterol  2 puff  Inhalation BID  . QUEtiapine  12.5 mg Oral Once  . QUEtiapine  25 mg Oral QHS  . sodium chloride flush  3 mL Intravenous Once  . sodium chloride flush  3 mL Intravenous Q12H   Continuous: . sodium chloride 75 mL/hr at 11/24/18 0414  . valproate sodium 500 mg (11/24/18 0914)    Pertinent Labs/Diagnostics: MRI brain: Contracting subacute right frontal hematoma without vasogenic edema or mass-effect  Ct Head Wo Contrast  Result Date: 11/22/2018 CLINICAL DATA:  Intracranial hemorrhage, known. Follow-up. EXAM: CT HEAD WITHOUT CONTRAST TECHNIQUE:IMPRESSION: 1. Continued expected evolution of the previously demonstrated intraparenchymal hemorrhage within the RIGHT frontal lobe, again less conspicuous than on earlier studies. Associated parenchymal edema within the RIGHT frontal lobe is stable in extent. No associated mass effect or midline shift. 2. No new intracranial abnormality. No new or acute intracranial hemorrhage. Electronically Signed   By: Franki Cabot M.D.   On: 11/22/2018 19:54   Mr Jeri Cos BT Contrast  Result Date: 11/23/2018 CLINICAL DATA:  Encephalopathy.IMPRESSION: 1. Contracting subacute right frontal hematoma without vasogenic edema or mass effect. 2. No acute finding or new abnormality. Electronically Signed   By: Monte Fantasia M.D.   On: 11/23/2018 13:09     Etta Quill PA-C [facility] [phone]   Assessment: 73 year old male with recent right frontal lobe ICH, presenting with a 2-week history of progressive mental status decline which acutely worsened over the past 2 weeks, in conjunction with new onset gait instability.  There was  concern for subclinical seizures however the EEG was normal.  MRI does not show any further worsening of right frontal hematoma.  Keppra was changed and currently patient is handling the new medication of Depakote very well.  Symptom/and behavioral changes could very well have been side effects from  [place].    Recommendations: -Continue Depakote at 500 mg twice daily - Continue IV hydration - Continue Seroquel at bedtime -- Outpatient neurology follow up   Please contact us if you have any further questions.  11/24/2018, 10:02 AM  NEUROHOSPITALIST ADDENDUM Performed a face to face diagnostic evaluation.   I have reviewed the contents of history and physical exam as documented by PA/ARNP/Resident and agree with above documentation.  I have discussed and formulated the above plan as documented. Edits to the note have been made as needed.  Patient alert and oriented. MRI Brain shows improving hematoma.  Has done well after transitioning him to Depakote.  Patient will need outpatient follow-up.  No driving for 6 months.   Karena Addison Shanyn Preisler MD [facility] [phone]   If 7pm to 7am, please call on call as listed on AMION.

## 2018-11-24 NOTE — Evaluation (Signed)
Occupational Therapy Evaluation Patient Details Name: Ronald [name] MRN: [id number] DOB: 1945-05-28 Today's Date: 11/24/2018    History of Present Illness Pt is a 73 y/o male who presents with progressing confusion and unsteady gait. Pt with recent admission for R frontal ICH with no new bleed seen on imaging. PMH significant for seizure, HTN, diastolic heart failure, DMII, COPD, CKD III.    Clinical Impression   Pt QTM:AUQJFH with spouse, msotly independent with ADL and mobility. Pt currently performing ADL with modA overall for ADL. Pt grooming at sink with set-upA overall and minguardA for stability as pt with severe posterior lean and unawareness of how exaggerated it was. Pt's transfers and mobility with minA+2 with RW with cues to avoid bumping into objects on R side. Pt avoiding obstacles 50% of the time in the hallway on R side. Pt with inattention throughout visual scanning/assessment so pt would need to be reassessed for visual deficits. Pt with good strength overall. Pt would greatly benefit from continued OT skilled services for ADL, mobility and safety in CIR setting. OT following acutely.    Follow Up Recommendations  CIR;Supervision/Assistance - 24 hour    Equipment Recommendations  3 in 1 bedside commode    Recommendations for Other Services       Precautions / Restrictions Precautions Precautions: Fall Restrictions Weight Bearing Restrictions: No      Mobility Bed Mobility Overal bed mobility: Needs Assistance Bed Mobility: Supine to Sit     Supine to sit: Supervision     General bed mobility comments: Increased time and effort to transition to EOB but no assist required.   Transfers Overall transfer level: Needs assistance Equipment used: 2 person hand held assist Transfers: Sit to/from Stand Sit to Stand: Min assist;+2 safety/equipment;+2 physical assistance         General transfer comment: Without RW, pt required +2 for safety/steadying with  transition to full standing position. Strong posterior lean without UE support requiring +2 for assist.     Balance Overall balance assessment: Needs assistance Sitting-balance support: No upper extremity supported;Feet supported Sitting balance-Leahy Scale: Good     Standing balance support: No upper extremity supported;Single extremity supported;During functional activity Standing balance-Leahy Scale: Poor Standing balance comment: Posterior lean with standing activity at the sink.                            ADL either performed or assessed with clinical judgement   ADL Overall ADL's : Needs assistance/impaired Eating/Feeding: Modified independent;Sitting   Grooming: Set up;Sitting   Upper Body Bathing: Set up;Sitting   Lower Body Bathing: Moderate assistance;Sitting/lateral leans;Sit to/from stand;Cueing for safety   Upper Body Dressing : Set up;Sitting   Lower Body Dressing: Moderate assistance;Sitting/lateral leans;Sit to/from stand   Toilet Transfer: Minimal assistance;Ambulation;+2 for physical assistance;+2 for safety/equipment   Toileting- Clothing Manipulation and Hygiene: Moderate assistance;Sitting/lateral lean;Sit to/from stand       Functional mobility during ADLs: Minimal assistance;+2 for physical assistance;+2 for safety/equipment;Rolling walker;Cueing for safety General ADL Comments: Pt modA overall for ADL. Pt grooming at sink with set-upA overall and minguardA for stability as pt with severe posterior lean and unawareness of how exaggerated it was.     Vision Baseline Vision/History: Wears glasses Wears Glasses: At all times Vision Assessment?: Yes;Vision impaired- to be further tested in functional context Eye Alignment: Within Functional Limits Ocular Range of Motion: Within Functional Limits;Other (comment)(inattention noted.) Alignment/Gaze Preference: Within Defined Limits Tracking/Visual Pursuits:  Decreased smoothness of eye movement  to RIGHT superior field;Other (comment)(inattentive during assessment)     Perception     Praxis      Pertinent Vitals/Pain Pain Assessment: No/denies pain Pain Intervention(s): Monitored during session     Hand Dominance Left   Extremity/Trunk Assessment Upper Extremity Assessment Upper Extremity Assessment: Generalized weakness   Lower Extremity Assessment Lower Extremity Assessment: Defer to PT evaluation LLE Deficits / Details: Mild weakness compared to right side. Grossly 4/5 in quads and hamstrings.    Cervical / Trunk Assessment Cervical / Trunk Assessment: Normal(Forward head posture with rounded shoulders)   Communication Communication Communication: HOH   Cognition Arousal/Alertness: Awake/alert Behavior During Therapy: WFL for tasks assessed/performed Overall Cognitive Status: Impaired/Different from baseline Area of Impairment: Safety/judgement;Problem solving                         Safety/Judgement: Decreased awareness of safety;Decreased awareness of deficits Awareness: Emergent Problem Solving: Slow processing;Requires verbal cues;Requires tactile cues General Comments: Pt requiring cues to stay on topic. Pt wanting to read newspaper x3 times during session when passng newspaper in hallway.   General Comments       Exercises     Shoulder Instructions      Home Living Family/patient expects to be discharged to:: Private residence Living Arrangements: Spouse/significant other Available Help at Discharge: Family;Available 24 hours/day Type of Home: House Home Access: Level entry;Elevator     Home Layout: Two level;Able to live on main level with bedroom/bathroom     Bathroom Shower/Tub: Occupational psychologist: Handicapped height     Home Equipment: [place] - single point;Walker - 4 wheels      Lives With: Spouse    Prior Functioning/Environment Level of Independence: Independent        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, Advertising account planner Problem List: Decreased strength;Decreased range of motion;Decreased activity tolerance;Impaired balance (sitting and/or standing);Decreased safety awareness;Obesity;Decreased cognition;Pain      OT Treatment/Interventions: Self-care/ADL training;Therapeutic exercise;Neuromuscular education;DME and/or AE instruction;Therapeutic activities;Patient/family education;Balance training;Cognitive remediation/compensation    OT Goals(Current goals can be found in the care plan section) Acute Rehab OT Goals Patient Stated Goal: wants to go home OT Goal Formulation: With patient Time For Goal Achievement: 12/08/18 Potential to Achieve Goals: Good ADL Goals Pt Will Perform Grooming: with modified independence;standing Pt Will Perform Lower Body Dressing: with modified independence;sit to/from stand Pt Will Transfer to Toilet: with modified independence;ambulating Pt Will Perform Toileting - Clothing Manipulation and hygiene: with modified independence;sitting/lateral leans;sit to/from stand Additional ADL Goal #1: Pt will tolerate x10 mins of sustained attention for higher level cogntive assessments and ADL components with minimal cueing to attend to task.  OT Frequency: Min 2X/week   Barriers to D/C:            Co-evaluation PT/OT/SLP Co-Evaluation/Treatment: Yes Reason for Co-Treatment: Necessary to address cognition/behavior during functional activity   OT goals addressed during session: ADL's and self-care      AM-PAC OT "6 Clicks" Daily Activity     Outcome Measure Help from another person eating meals?: None Help from another person taking care of personal grooming?: A Little Help from another person toileting, which includes using toliet, bedpan, or urinal?: A Lot Help from another person bathing (including washing, rinsing, drying)?: A Little Help from another person to put on and taking off regular upper body clothing?: None Help from  another person to put on and taking off regular lower body clothing?: A Lot 6 Click Score: 18   End of Session Equipment Utilized During Treatment: Gait belt;Rolling walker Nurse Communication: Mobility status  Activity Tolerance: Patient tolerated treatment well Patient left: in chair;with call bell/phone within reach;with chair alarm set  OT Visit Diagnosis: Unsteadiness on feet (R26.81);Muscle weakness (generalized) (M62.81);Other symptoms and signs involving cognitive function                Time: 0815-0850 OT Time Calculation (min): 35 min Charges:  OT General Charges $OT Visit: 1 Visit OT Evaluation $OT Eval Moderate Complexity: 1 Mod  Darryl Nestle) Marsa Aris OTR/L Acute Rehabilitation Services Pager: [phone] Office: [phone]   Audie Pinto 11/24/2018, 2:41 PM

## 2018-11-24 NOTE — Progress Notes (Signed)
Patient refused CPAP for tonight 

## 2018-11-24 NOTE — Evaluation (Signed)
Physical Therapy Evaluation Patient Details Name: Ronald [name] MRN: [id number] DOB: 09-Feb-1946 Today's Date: 11/24/2018   History of Present Illness  Pt is a 73 y/o male who presents with progressing confusion and unsteady gait. Pt with recent admission for R frontal ICH with no new bleed seen on imaging. PMH significant for seizure, HTN, diastolic heart failure, DMII, COPD, CKD III.   Clinical Impression  Pt admitted with above diagnosis. Pt currently with functional limitations due to the deficits listed below (see PT Problem List). At the time of PT eval pt was able to perform transfers and ambulation with up to +2 min assist for balance support and safety. Pt with mild R side weakness and mild R side inattention, as noted pt was consistently running into obstacles in the hallway with his walker on the R side. Pt does not appear to be at baseline of function, and noted heavy posterior lean without UE support both in sitting and standing. Recommend CIR consult to maximize functional independence and decrease risk for falls prior to return home with wife. Acutely, pt will benefit from skilled PT to increase their independence and safety with mobility to allow discharge to the venue listed below.       Follow Up Recommendations CIR;Supervision/Assistance - 24 hour    Equipment Recommendations  None recommended by PT    Recommendations for Other Services Rehab consult     Precautions / Restrictions Precautions Precautions: Fall Restrictions Weight Bearing Restrictions: No      Mobility  Bed Mobility Overal bed mobility: Needs Assistance Bed Mobility: Supine to Sit     Supine to sit: Supervision     General bed mobility comments: Increased time and effort to transition to EOB but no assist required.   Transfers Overall transfer level: Needs assistance Equipment used: 2 person hand held assist Transfers: Sit to/from Stand Sit to Stand: Min assist;+2 safety/equipment;+2  physical assistance         General transfer comment: Without RW, pt required +2 for safety/steadying with transition to full standing position. Strong posterior lean without UE support requiring +2 for assist.   Ambulation/Gait Ambulation/Gait assistance: Min assist;+2 physical assistance;+2 safety/equipment Gait Distance (Feet): 200 Feet Assistive device: 2 person hand held assist;Rolling walker (2 wheeled) Gait Pattern/deviations: Step-through pattern;Decreased stride length;Staggering right;Staggering left;Wide base of support Gait velocity: decreased Gait velocity interpretation: 1.31 - 2.62 ft/sec, indicative of limited community ambulator General Gait Details: Initially with +2 HHA for support. Constant assist provided for balance support and safety with advancement of LE's. With RW, pt required +1 min assist for occasional R knee buckle and to avoid obstacles on the R side.   Stairs            Wheelchair Mobility    Modified Rankin (Stroke Patients Only) Modified Rankin (Stroke Patients Only) Pre-Morbid Rankin Score: No symptoms Modified Rankin: Moderately severe disability     Balance Overall balance assessment: Needs assistance Sitting-balance support: No upper extremity supported;Feet supported Sitting balance-Leahy Scale: Good     Standing balance support: No upper extremity supported;Single extremity supported;During functional activity Standing balance-Leahy Scale: Poor Standing balance comment: Posterior lean with standing activity at the sink.                              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 Grossly 4/5 in quads and hamstrings.     Cervical / Trunk Assessment Cervical / Trunk Assessment: Normal(Forward head posture with rounded shoulders)  Communication   Communication: HOH  Cognition Arousal/Alertness: Awake/alert Behavior During Therapy: WFL for tasks assessed/performed Overall Cognitive Status: Impaired/Different from baseline Area of Impairment: Safety/judgement;Problem solving                         Safety/Judgement: Decreased awareness of safety;Decreased awareness of deficits Awareness: Emergent Problem Solving: Slow processing;Requires verbal cues;Requires tactile cues        General Comments      Exercises     Assessment/Plan    PT Assessment Patient needs continued PT services  PT Problem List Decreased balance;Decreased mobility       PT Treatment Interventions Gait training;Stair training;Functional mobility training;Therapeutic activities;Therapeutic exercise;Balance training;Patient/family education    PT Goals (Current goals can be found in the Care Plan section)  Acute Rehab PT Goals PT Goal Formulation: With patient Time For Goal Achievement: 12/08/18 Potential to Achieve Goals: Good    Frequency Min 4X/week   Barriers to discharge        Co-evaluation PT/OT/SLP Co-Evaluation/Treatment: Yes Reason for Co-Treatment: Necessary to address cognition/behavior during functional activity;For  patient/therapist safety;To address functional/ADL transfers PT goals addressed during session: Mobility/safety with mobility;Balance;Proper use of DME OT goals addressed during session: ADL's and self-care       AM-PAC PT "6 Clicks" Mobility  Outcome Measure Help needed turning from your back to your side while in a flat bed without using bedrails?: None Help needed moving from lying on your back to sitting on the side of a flat bed without using bedrails?: A Little Help needed moving to and from a bed to a chair (including a wheelchair)?: A Little Help needed standing up from a chair using your arms (e.g., wheelchair or bedside chair)?: A Little Help needed to walk in hospital room?: A Little Help needed climbing 3-5 steps with a railing? : A Little 6 Click Score: 19    End of Session Equipment Utilized During Treatment: Gait belt Activity Tolerance: Patient tolerated treatment well Patient left: in chair;with call bell/phone within reach;with chair alarm set Nurse Communication: Mobility status PT Visit Diagnosis: Other abnormalities of gait and mobility (R26.89);Other symptoms and signs involving the nervous system (R29.898)    Time: 2500-3704 PT Time Calculation (min) (ACUTE ONLY): 35 min   Charges:   PT Evaluation $PT Eval Moderate Complexity: 1 Mod          Rolinda Roan, PT, DPT Acute Rehabilitation Services Pager: [phone] Office: [phone]   Thelma Comp 11/24/2018, 9:27 AM

## 2018-11-24 NOTE — Telephone Encounter (Signed)
Patient's wife [name] is calling because Dr. Elease Hashimoto called her Friday afternoon - she was in the hospital with her husband.  She wanted to let Dr. Elease Hashimoto know that her husband is still in the hospital. If Dr. Elease Hashimoto could review the patient's chart. Thank you.

## 2018-11-24 NOTE — Progress Notes (Signed)
Rehab Admissions Coordinator Note:  Patient was screened by Cleatrice Burke for appropriateness for an Inpatient Acute Rehab Consult per PT recs. .  At this time, we are recommending Inpatient Rehab consult if pt family would like to be considered for an inpt rehab admit. Please advise.Cleatrice Burke RN MSN 11/24/2018, 9:34 AM  I can be reached at [phone].

## 2018-11-24 NOTE — PMR Pre-admission (Signed)
PMR Admission Coordinator Pre-Admission Assessment  Patient: Ronald [name] is an 73 y.o., male MRN: [id number] DOB: 13-May-1945 Height: '5\' 11"'  (180.3 cm) Weight: 103.7 kg  Insurance Information HMO:     PPO:      PCP:      IPA:      80/20:  yes    OTHER:  PRIMARY: Medicare Part A and B      Policy#: [id number]      Subscriber: Patient CM Name:       Phone#:      Fax#:  Pre-Cert#:       Employer:  Benefits:  Phone #: NA     Name: verified eligibility online via [place] on 11/24/2018 Eff. Date: Part A effective 05/10/2010, Part B effective 07/08/2012     Deduct: $1,408      Out of Pocket Max: NA      Life Max: NA CIR: Covered per Medicare Guidelines once yearly deductible is met.       SNF: days 1-20, 100%, days 21-100, 80% Outpatient: 80%     Co-Pay: 20% Home Health: 100%      Co-Pay:  DME: 80%     Co-Pay: 20% Providers: Pt's choice  SECONDARY: BCBS      Policy#: [id number]      Subscriber: Ronald [name] CM Name:       Phone#:      Fax#:  Pre-Cert#:       Employer:  Benefits:  Phone #: [phone]     Name:  Eff. Date:      Deduct:       Out of Pocket Max:       Life Max:  CIR:       SNF:  Outpatient:      Co-Pay:  Home Health:       Co-Pay:  DME:     Co-Pay:   **AC called [facility] at [phone] and spoke with Ronald [name] on 8/20 to confirm Medicare is Primary and BCBS is Secondary. Ref # for call: [id number]  Medicaid Application Date:       Case Manager:  Disability Application Date:       Case Worker:   The "Data Collection Information Summary" for patients in Inpatient Rehabilitation Facilities with attached "Privacy Act [place] Records" was provided and verbally reviewed with: Family  Emergency Contact Information Contact Information    Name Relation Home Work Mobile   Nowthen L Spouse [phone]        Current Medical History  Patient Admitting Diagnosis: Decreased functional ability with altered mental status secondary to recent right  frontal ICH with seizure  History of Present Illness: Ronald [name] is a 73 year old male with history significant for right frontal ICH related to Eliquis and admitted 10/21/2018 until 10/27/2018 and discharged to home ambulating 240 feet without assistive device, atrial fibrillation followed by Dr. Dorris Carnes and no longer on anticoagulation, hypertension, chronic diastolic congestive heart failure, history of ascending thoracic aortic aneurysm followed as an outpatient., COPD maintained on CPAP and followed by Dr. Halford Chessman, chronic kidney disease stage III with creatinine 1.62, type 2 diabetes mellitus and seizure maintained on Keppra 500 mg twice daily.  Pt presented 11/22/2018 with increased altered mental status and poor balance leaning to the right side.  Urinalysis negative, COVID negative.  Cranial CT scan showed no acute or new intracranial hemorrhage.  Decreasing conspicuity of the right frontal hemorrhage and slight decrease in associated edema.  CT angiogram of head and  neck with no emergent large vessel occlusion.  MRI of the brain completed 11/23/2018 again showing no acute findings or new abnormality.  Prior subacute hematoma measuring 4.2 x 2 cm.  No significant adjacent edema or mass-effect.  EEG negative for seizure.  Most recent echocardiogram after initial [place] 10/22/2018 with ejection fraction of 60% hyperdynamic systolic function.  Neurology follow-up patient's Keppra was changed to Depakote 500 mg twice daily.  Still with bouts of confusion and restlessness and maintained on Seroquel.  Tolerating a regular diet.  Therapy evaluations completed and patient is to be admitted for a comprehensive rehab program on 11/27/2018.  Complete NIHSS TOTAL: 1  Patient's medical record from [facility] has been reviewed by the rehabilitation admission coordinator and physician.  Past Medical History  Past Medical History:  Diagnosis Date  . Allergic rhinitis   . Anticoagulant long-term  use    eliquis  . Cardiomyopathy due to systemic disease [facility])    followed by dr harding  . COPD with emphysema [facility])    pulmologist-  dr Halford Chessman  . Dyspnea    occasional per pt  . History of colon polyps    tubular adenoma 2013  . History of gout    09-05-2017 last flare-up  05/ 2019 3 wks ago, feet  . History of sepsis 02/18/2017   per d/c note probable uti, acute chf, acute renal failure, hypoxia  . Hyperlipidemia   . Hyperplasia of prostate with lower urinary tract symptoms (LUTS)   . Hypertension   . OSA on CPAP    per study 08-03-2004  Severe OSA  . Persistent atrial fibrillation    cardiologist --  dr Dorris Carnes--  post cardioversion 05-07-2014  . Prostate cancer [facility]) urologsit-  dr Alyson Ingles--- as of 05-21-2017 per pt last PSA 11 approx.   Dx  2014--  stage T1c, Gleason 3+3=6, PSA 6.67--  Active surveillance/  04/ 2019  Stage T1b, Gleason 3+4, PSA 12.8- plan external radiation therapy    . 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 ([place])    pulmologist-  dr Halford Chessman  . Seasonal allergies   . Sigmoid diverticulosis   . Systolic and diastolic CHF, chronic [facility])    cardiologist-  [name]  . Tinea versicolor   . Type 2 diabetes mellitus ([place])   . Wears hearing aid in both ears     Family History   family history includes Breast cancer in his mother; Ovarian cancer in his sister; Stroke in an other family member.  Prior Rehab/Hospitalizations Has the patient had prior rehab or hospitalizations prior to admission? Yes  Has the patient had major surgery during 100 days prior to admission? No   Current Medications  Current Facility-Administered Medications:  .  acetaminophen (TYLENOL) tablet 650 mg, 650 mg, Oral, Q6H PRN **OR** acetaminophen (TYLENOL) suppository 650 mg, 650 mg, Rectal, Q6H PRN, [name], Ronald S, MD .  albuterol (PROVENTIL) (2.5 MG/3ML) 0.083% nebulizer solution 3 mL, 3 mL, Inhalation, Q6H PRN, [name], Ronald S, MD .  amiodarone (PACERONE)  tablet 200 mg, 200 mg, Oral, Daily, [name], Ilene Qua, MD, 200 mg at 11/27/18 1108 .  amLODipine (NORVASC) tablet 10 mg, 10 mg, Oral, Daily, Adhikari, Amrit, MD, 10 mg at 11/27/18 1108 .  atorvastatin (LIPITOR) tablet 10 mg, 10 mg, Oral, QHS, [name], Ilene Qua, MD, 10 mg at 11/26/18 2126 .  carvedilol (COREG) tablet 12.5 mg, 12.5 mg, Oral, BID WC, [name], Ilene Qua, MD, 12.5 mg at 11/27/18 1109 .  divalproex (DEPAKOTE) DR tablet 500 mg, 500 mg, Oral, Q12H, Karren Cobble, RPH, 500 mg at 11/27/18 1109 .  HYDROcodone-acetaminophen (NORCO/VICODIN) 5-325 MG per tablet 1 tablet, 1 tablet, Oral, Q6H PRN, [name], Ilene Qua, MD, 1 tablet at 11/22/18 1801 .  insulin aspart (novoLOG) injection 0-5 Units, 0-5 Units, Subcutaneous, QHS, [name], Ronald S, MD .  insulin aspart (novoLOG) injection 0-9 Units, 0-9 Units, Subcutaneous, TID WC, [name], Ilene Qua, MD, 1 Units at 11/27/18 [phone] .  labetalol (NORMODYNE) injection 10 mg, 10 mg, Intravenous, Q2H PRN, Shelly Coss, MD, 10 mg at 11/22/18 2337 .  losartan (COZAAR) tablet 25 mg, 25 mg, Oral, Daily, Adhikari, Amrit, MD, 25 mg at 11/27/18 1109 .  mometasone-formoterol (DULERA) 200-5 MCG/ACT inhaler 2 puff, 2 puff, Inhalation, BID, [name], Ilene Qua, MD, 2 puff at 11/27/18 0830 .  ondansetron (ZOFRAN) tablet 4 mg, 4 mg, Oral, Q6H PRN **OR** ondansetron (ZOFRAN) injection 4 mg, 4 mg, Intravenous, Q6H PRN, [name], Ilene Qua, MD, 4 mg at 11/23/18 2214 .  polyethylene glycol (MIRALAX / GLYCOLAX) packet 17 g, 17 g, Oral, Daily PRN, [name], Ronald S, MD .  potassium chloride SA (K-DUR) CR tablet 40 mEq, 40 mEq, Oral, Q4H, Adhikari, Amrit, MD, 40 mEq at 11/27/18 1109 .  QUEtiapine (SEROQUEL) tablet 25 mg, 25 mg, Oral, QHS, Aroor, Karena Addison R, MD .  sodium chloride flush (NS) 0.9 % injection 3 mL, 3 mL, Intravenous, Once, Ward, Kristen N, DO .  sodium chloride flush (NS) 0.9 % injection 3 mL, 3 mL, Intravenous, Q12H, [name], Ilene Qua, MD, 3 mL at 11/26/18 2128  Patients Current  Diet:  Diet Order            Diet - low sodium heart healthy        Diet heart healthy/carb modified Room service appropriate? No; Fluid consistency: Thin  Diet effective now              Precautions / Restrictions Precautions Precautions: Fall Restrictions Weight Bearing Restrictions: No   Has the patient had 2 or more falls or a fall with injury in the past year? No  Prior Activity Level Community (5-7x/wk): very active until 2 week decline (cognitive decline and functional decline after being out of hospital)  Prior Functional Level Self Care: Did the patient need help bathing, dressing, using the toilet or eating? Independent  Indoor Mobility: Did the patient need assistance with walking from room to room (with or without device)? Independent  Stairs: Did the patient need assistance with internal or external stairs (with or without device)? Independent  Functional Cognition: Did the patient need help planning regular tasks such as shopping or remembering to take medications? Independent  Home Assistive Devices / Equipment Home Assistive Devices/Equipment: CPAP, Hearing aid Home Equipment: Cane - single point, Walker - 4 wheels  Prior Device Use: Indicate devices/aids used by the patient prior to current illness, exacerbation or injury? None of the above  Current Functional Level Cognition  Overall Cognitive Status: Impaired/Different from baseline Current Attention Level: Focused Orientation Level: (P) Oriented to person Following Commands: Follows one step commands inconsistently, Follows one step commands with increased time Safety/Judgement: Decreased awareness of deficits, Decreased awareness of safety General Comments: Pt continues to show cognitive decline with hallucinations while therapy present and unaware of location/situation. Pt has difficulty following commands demonstrates slow processing and difficulty sequencing. Pt requires max cues to complete  commands.    Extremity Assessment (includes Sensation/Coordination)  Upper Extremity Assessment: Generalized weakness  Lower  Extremity Assessment: Defer to PT evaluation LLE Deficits / Details: Mild weakness compared to right side. Grossly 4/5 in quads and hamstrings.     ADLs  Overall ADL's : Needs assistance/impaired Eating/Feeding: Modified independent, Sitting Grooming: Set up, Sitting Upper Body Bathing: Set up, Sitting Lower Body Bathing: Moderate assistance, Sitting/lateral leans, Sit to/from stand, Cueing for safety Upper Body Dressing : Set up, Sitting Lower Body Dressing: Moderate assistance, Sitting/lateral leans, Sit to/from stand Toilet Transfer: Minimal assistance, Ambulation, +2 for physical assistance, +2 for safety/equipment Toileting- Clothing Manipulation and Hygiene: Moderate assistance, Sitting/lateral lean, Sit to/from stand Functional mobility during ADLs: Minimal assistance, +2 for physical assistance, +2 for safety/equipment, Rolling walker, Cueing for safety General ADL Comments: Pt modA overall for ADL. Pt grooming at sink with set-upA overall and minguardA for stability as pt with severe posterior lean and unawareness of how exaggerated it was.    Mobility  Overal bed mobility: Needs Assistance Bed Mobility: Supine to Sit Supine to sit: Min assist Sit to supine: Min assist General bed mobility comments: Pt requires min A for supine to sit. VCs and tactile cues required to complete bed mobility. Pt demonstrates increased time to complete and loss of ability to focus on tasks.    Transfers  Overall transfer level: Needs assistance Equipment used: Rolling walker (2 wheeled) Transfers: Sit to/from Stand Sit to Stand: Mod assist, +2 physical assistance, +2 safety/equipment General transfer comment: Pt requires mod A +2 for physical assistance and safety. Requires lift assistance for sit to stand transfer with RW. Pt required VCs for hand placement, however,  continued to pull up on RW. Multiple VCs required for Pt to bring feet flat on floor.    Ambulation / Gait / Stairs / Wheelchair Mobility  Ambulation/Gait Ambulation/Gait assistance: Min assist, Mod assist, +2 physical assistance, +2 safety/equipment Gait Distance (Feet): 50 Feet Assistive device: Rolling walker (2 wheeled) Gait Pattern/deviations: Decreased step length - right, Decreased step length - left, Trunk flexed, Drifts right/left, Staggering right, Antalgic, Step-to pattern General Gait Details: Pt demonstrates a significant change in ambulation after reports of pain in B feet. Pt ambulates with a step to pattern with RW +2 assist. Pt requires repeated VCs for foot placement underneath body. Pt shows decreased step length on both LE.  Also, trunk is flexed and pt drifts/staggers to the R. Gait velocity: decreased Gait velocity interpretation: <1.31 ft/sec, indicative of household ambulator    Posture / Balance Balance Overall balance assessment: Needs assistance Sitting-balance support: Feet supported, No upper extremity supported Sitting balance-Leahy Scale: Fair Postural control: Posterior lean Standing balance support: Bilateral upper extremity supported Standing balance-Leahy Scale: Poor Standing balance comment: Pt requires external support including UE support.    Special needs/care consideration BiPAP/CPAP : CPAP at home per wife CPM : no Continuous Drip IV : no Dialysis : no        Days : no Life Vest : no Oxygen : no Special Bed : no Trach Size : no Wound Vac (area) : no      Location : no Skin: no areas of concern               Bowel mgmt: last BM 11/22/2018 Bladder mgmt: incontinent, urinary catheter in place Diabetic mgmt: yes Behavioral consideration : impulsive, tangential, distracted  Chemo/radiation : no   Previous Home Environment (from acute therapy documentation) Living Arrangements: Spouse/significant other  Lives With: Spouse Available Help at  Discharge: Family, Available 24 hours/day Type of Home: House Home Layout:  Two level, Able to live on main level with bedroom/bathroom Home Access: Level entry, Elevator Bathroom Shower/Tub: Multimedia programmer: Handicapped height [place]: No  Discharge Living Setting Plans for Discharge Living Setting: Patient's home, Lives with (comment)(wife and mother-in-law) Type of Home at Discharge: House Discharge Home Layout: Two level, Able to live on main level with bedroom/bathroom Alternate Level Stairs-Rails: None(NA) Alternate Level Stairs-Number of Steps: NA Discharge Home Access: Stairs to enter Entrance Stairs-Rails: Can reach both Entrance Stairs-Number of Steps: 2 Discharge Bathroom Shower/Tub: Walk-in shower Discharge Bathroom Toilet: Handicapped height Discharge Bathroom Accessibility: Yes How Accessible: Accessible via walker Does the patient have any problems obtaining your medications?: No  Social/Family/Support Systems Patient Roles: Spouse, Other (Comment)(has hobbies(fish, golf, yardwork, invests)) Contact Information: spouse Katharine Look): [phone] Anticipated Caregiver: spouse Anticipated Caregiver's Contact Information: see above Ability/Limitations of Caregiver: Min A Caregiver Availability: 24/7 Discharge Plan Discussed with Primary Caregiver: Yes Is Caregiver In Agreement with Plan?: Yes Does Caregiver/Family have Issues with Lodging/Transportation while Pt is in Rehab?: No  Goals/Additional Needs Patient/Family Goal for Rehab: PT/OT: Mod I/Supervision; SLP: Mod I/Supervision Expected length of stay: 7-10 days Cultural Considerations: NA Dietary Needs: heart healthy, carb modified, thin liquids Equipment Needs: TBD Special Service Needs: NA Pt/Family Agrees to Admission and willing to participate: Yes Program Orientation Provided & Reviewed with Pt/Caregiver Including Roles  & Responsibilities: Yes(pt and spouse)  Barriers to Discharge:  Home environment access/layout, Behavior  Barriers to Discharge Comments: impulsive, fall risk.   Decrease burden of Care through IP rehab admission: NA  Possible need for SNF placement upon discharge: Not anticipated; pt has good prognosis for further progress through CIR. Pt has great social support and a wife who is willing to provide assist as needed at DC.   Patient Condition: I have reviewed medical records from [facility], spoken with RN, and patient and spouse. I met with patient at the bedside for inpatient rehabilitation assessment.  Patient will benefit from ongoing PT, OT and SLP, can actively participate in 3 hours of therapy a day 5 days of the week, and can make measurable gains during the admission.  Patient will also benefit from the coordinated team approach during an Inpatient Acute Rehabilitation admission.  The patient will receive intensive therapy as well as Rehabilitation physician, nursing, social worker, and care management interventions.  Due to bladder management, bowel management, safety, skin/wound care, disease management, medication administration, pain management and patient education the patient requires 24 hour a day rehabilitation nursing.  The patient is currently Min A x2 with mobility and Min Ax2 to Mod A for basic ADLs.  Discharge setting and therapy post discharge at home with oupatient is anticipated.  Patient has agreed to participate in the Acute Inpatient Rehabilitation Program and will admit 11/27/2018.  Preadmission Screen Completed By:  Jhonnie Garner, 11/27/2018 12:44 PM ______________________________________________________________________   Discussed status with Dr. Naaman Plummer on 11/27/2018 at 12:01PM and received approval for admission today.  Admission Coordinator:  Jhonnie Garner, OT, time 12:01PM/Date 11/27/2018   Assessment/Plan: Diagnosis: encephalopathy, recent CVA 1. Does the need for close, 24 hr/day Medical supervision in concert with  the patient's rehab needs make it unreasonable for this patient to be served in a less intensive setting? Yes 2. Co-Morbidities requiring supervision/potential complications: sz d/o, gout 3. Due to bladder management, bowel management, safety, skin/wound care, disease management, medication administration, pain management and patient education, does the patient require 24 hr/day rehab nursing? Yes 4. Does the patient require coordinated  care of a physician, rehab nurse, PT (1-2 hrs/day, 5 days/week), OT (1-2 hrs/day, 5 days/week) and SLP (1-2 hrs/day, 5 days/week) to address physical and functional deficits in the context of the above medical diagnosis(es)? Yes Addressing deficits in the following areas: balance, endurance, locomotion, strength, transferring, bowel/bladder control, bathing, dressing, feeding, grooming, toileting, cognition and psychosocial support 5. Can the patient actively participate in an intensive therapy program of at least 3 hrs of therapy 5 days a week? Yes 6. The potential for patient to make measurable gains while on inpatient rehab is good 7. Anticipated functional outcomes upon discharge from inpatients are: modified independent and supervision PT, modified independent and supervision OT, supervision and +/- min assist SLP 8. Estimated rehab length of stay to reach the above functional goals is: 7-12 days depending upon cognitive progress 9. Anticipated D/C setting: Home 10. Anticipated post D/C treatments: [facility] therapy 11. Overall Rehab/Functional Prognosis: excellent  MD Signature: Meredith Staggers, MD, [facility] 11/27/2018

## 2018-11-24 NOTE — Progress Notes (Signed)
Inpatient Rehab Admissions:  Inpatient Rehab Consult received.  I met with pt and his wife at the bedside for rehabilitation assessment and to discuss goals and expectations of an inpatient rehab admission. Pt is open to the idea of rehab and wife would really like to pursue this program if he qualifies. AC discussed case with PM&R MD to see if pt would qualify as his MRI did not show acute findings. Dr. Naaman Plummer would like for therapy to see him tomorrow and reassess functional level at that time to determine if CIR is warranted.   Will follow up tomorrow.   Jhonnie Garner, OTR/L  Rehab Admissions Coordinator  [phone] 11/24/2018 4:43 PM

## 2018-11-24 NOTE — Progress Notes (Addendum)
PROGRESS NOTE    Ronald [name]  KNL:[id number] DOB: July 10, 1945 DOA: 11/21/2018 PCP: [name], MD   Brief Narrative:  Patient is a 73 year old male with history of recent right frontal ICH, A. fib not on anticoagulation, hypertension, chronic diastolic CHF, COPD, CKD stage III, diabetes type 2, seizure disorder who presents from home for evaluation of worsening confusion and gait problem.  Neurology has been consulted and following. There was also report of one seizure episode.  CT imaging of the brain did not show any acute intracranial abnormality and  shows evolution of right frontal intraparenchymal hemorrhage. Patient admitted for the evaluation of altered mental status. Underwent MRI without finding of any acute intracranial abnormalities.  PT/OT recommended CIR.  CIR consulted.  Mental status has improved. Patient is medically stable for discharge to CIR as soon as the bed is available.  Assessment & Plan:   Principal Problem:   Acute encephalopathy Active Problems:   Obstructive sleep apnea   Essential hypertension   Type 2 diabetes mellitus with hyperglycemia (HCC)   Paroxysmal atrial fibrillation (HCC)   Chronic diastolic CHF (congestive heart failure) (HCC)   ICH (intracerebral hemorrhage) (HCC) w/ SAH while on Eliquis   Seizures ([place]), secondsry to ICH   CKD (chronic kidney disease), stage III ([place])   Thoracic ascending aortic aneurysm (HCC)   AMS (altered mental status)   Altered mental status: Suspected to be from [place] versus behavioral/personality changes secondary to frontal lobe hematoma.  Keppra has been discontinued.  Started on Depakote. EEG did not show any epileptiform activities. MRI did not show any acute intracranial abnormalities. This morning he remains alert, awake, reading newspaper, following commands, sitting in the chair.  Participated with PT/OT.  Tells correctly the month, year and knows he is in the hospital.  Recent intracerebral  hemorrhage: Currently stable.  Brain imaging  finding as above.  Seizures: Reported of an episode of seizure recently.  Was on Keppra at home.  Keppra changed to Depakote.  Continue seizure precaution.  Paroxysmal A. fib: Currently in sinus rhythm.CHADS-VASc 7.  Not on anticoagulation due to history of ICH.  Continue amiodarone.  Rate is well controlled.  Hypertension: Currently blood stable.  Continue to monitor blood pressure.  Continue current medicines  Type 2 diabetes mellitus: Hemoglobin A1c 6.9 in July 2020.  On metformin, Ozempic at home .Continue sliding scale insulin here.  Chronic diastolic CHF: Currently compensated.  Lasix on hold due to AKI.   OSA: Continue CPAP at night.  History of ascending thoracic aortic aneurysm:  Imaging showed  4.1 cm noted incidentally on CTA neck in ED.  Follow-up imaging recommended as an outpatient.  Debility/deconditioning: PT/OT evaluation requested.recommended CIR          DVT prophylaxis: SCD Code Status: Full Family Communication: Wife on phone Disposition Plan: CIR as soon as bed is available  Consultants: Neurology  Procedures: EEG  Antimicrobials:  Anti-infectives (From admission, onward)   None      Subjective:  Patient seen and examined the bedside this morning.  Very comfortable, alert and awake.  Participated with physical therapy.  Sitting in the chair, reading newspaper.  Mostly  oriented   Objective: Vitals:   11/24/18 0319 11/24/18 0625 11/24/18 0700 11/24/18 1100  BP: (!) 173/99  (!) 143/131 139/88  Pulse: 61  81 70  Resp: 16  18 18   Temp: 98.4 F (36.9 C)  98.3 F (36.8 C) 98.5 F (36.9 C)  TempSrc: Oral  Oral Oral  SpO2: 94%  95% 95%  Weight:  101.5 kg      Intake/Output Summary (Last 24 hours) at 11/24/2018 1153 Last data filed at 11/24/2018 0800 Gross per 24 hour  Intake 1672.24 ml  Output 1150 ml  Net 522.24 ml   Filed Weights   11/24/18 0625  Weight: 101.5 kg     Examination:  General exam: Comfortable, alert and awake HEENT:PERRL,Oral mucosa moist, Ear/Nose normal on gross exam Respiratory system: Bilateral equal air entry, normal vesicular breath sounds, no wheezes or crackles  Cardiovascular system: S1 & S2 heard, RRR. No JVD, murmurs, rubs, gallops or clicks. No pedal edema. Gastrointestinal system: Abdomen is nondistended, soft and nontender. No organomegaly or masses felt. Normal bowel sounds heard. Central nervous system: Alert, awake,  Tells correctly the month, year and knows he is in the hospital.  extremities: No edema, no clubbing ,no cyanosis, distal peripheral pulses palpable. Skin: No rashes, lesions or ulcers,no icterus ,no pallor MSK: Normal muscle bulk,tone ,power   Data Reviewed: I have personally reviewed following labs and imaging studies  CBC: Recent Labs  Lab 11/21/18 1644 11/22/18 1332  WBC 5.2 4.7  NEUTROABS 3.4  --   HGB 14.8 14.2  HCT 43.1 42.5  MCV 98.0 98.4  PLT 207 224   Basic Metabolic Panel: Recent Labs  Lab 11/21/18 1644 11/22/18 1332 11/23/18 0448 11/24/18 0810  NA 137 140 137 138  K 4.4 4.6 4.2 3.5  CL 104 106 102 106  CO2 22 23 24 24   GLUCOSE 135* 134* 174* 126*  BUN 18 16 12 10   CREATININE 1.62* 1.24 1.31* 1.17  CALCIUM 9.3 9.1 8.6* 8.4*   GFR: Estimated Creatinine Clearance: 68.2 mL/min (by C-G formula based on SCr of 1.17 mg/dL). Liver Function Tests: Recent Labs  Lab 11/21/18 1644  AST 18  ALT 21  ALKPHOS 82  BILITOT 0.8  PROT 6.5  ALBUMIN 3.8   No results for input(s): LIPASE, AMYLASE in the last 168 hours. No results for input(s): AMMONIA in the last 168 hours. Coagulation Profile: Recent Labs  Lab 11/21/18 1644  INR 1.0   Cardiac Enzymes: No results for input(s): CKTOTAL, CKMB, CKMBINDEX, TROPONINI in the last 168 hours. BNP (last 3 results) No results for input(s): PROBNP in the last 8760 hours. HbA1C: No results for input(s): HGBA1C in the last 72  hours. CBG: Recent Labs  Lab 11/23/18 1103 11/23/18 1656 11/23/18 2109 11/24/18 0628 11/24/18 1121  GLUCAP 132* 166* 127* 110* 171*   Lipid Profile: No results for input(s): CHOL, HDL, LDLCALC, TRIG, CHOLHDL, LDLDIRECT in the last 72 hours. Thyroid Function Tests: No results for input(s): TSH, T4TOTAL, FREET4, T3FREE, THYROIDAB in the last 72 hours. Anemia Panel: No results for input(s): VITAMINB12, FOLATE, FERRITIN, TIBC, IRON, RETICCTPCT in the last 72 hours. Sepsis Labs: No results for input(s): PROCALCITON, LATICACIDVEN in the last 168 hours.  Recent Results (from the past 240 hour(s))  SARS CORONAVIRUS 2 Nasal Swab Aptima Multi Swab     Status: None   Collection Time: 11/22/18  4:58 AM   Specimen: Aptima Multi Swab; Nasal Swab  Result Value Ref Range Status   SARS Coronavirus 2 NEGATIVE NEGATIVE Final    Comment: (NOTE) SARS-CoV-2 target nucleic acids are NOT DETECTED. The SARS-CoV-2 RNA is generally detectable in upper and lower respiratory specimens during the acute phase of infection. Negative results do not preclude SARS-CoV-2 infection, do not rule out co-infections with other pathogens, and should not be used as the sole basis  for treatment or other patient management decisions. Negative results must be combined with clinical observations, patient history, and epidemiological information. The expected result is Negative. Fact Sheet for Patients: SugarRoll.be Fact Sheet for Healthcare Providers: https://www.woods-mathews.com/ This test is not yet approved or cleared by the [place] FDA and  has been authorized for detection and/or diagnosis of SARS-CoV-2 by FDA under an Emergency Use Authorization (EUA). This EUA will remain  in effect (meaning this test can be used) for the duration of the COVID-19 declaration under Section 56 4(b)(1) of the Act, 21 U.S.C. section 360bbb-3(b)(1), unless the authorization is  terminated or revoked sooner. Performed at [facility], [place] [address], [place], [place] [address]          Radiology Studies: Ct Head Wo Contrast  Result Date: 11/22/2018 CLINICAL DATA:  Intracranial hemorrhage, known. Follow-up. EXAM: CT HEAD WITHOUT CONTRAST TECHNIQUE: Contiguous axial images were obtained from the base of the skull through the vertex without intravenous contrast. COMPARISON:  Head CTs dated 11/21/2018 and 11/13/2018. FINDINGS: Brain: Continued expected evolution of the previously demonstrated intraparenchymal hemorrhage within the RIGHT frontal lobe, again less conspicuous than on earlier studies. The associated parenchymal edema within the RIGHT frontal lobe is stable in extent. No associated mass effect or midline shift appreciated. No new parenchymal or extra-axial hemorrhage. No new edema. Ventricles are stable in size and configuration. Vascular: Chronic calcified atherosclerotic changes of the large vessels at the skull base. No unexpected hyperdense vessel. Skull: Normal. Negative for fracture or focal lesion. Sinuses/Orbits: No acute finding. Other: None. IMPRESSION: 1. Continued expected evolution of the previously demonstrated intraparenchymal hemorrhage within the RIGHT frontal lobe, again less conspicuous than on earlier studies. Associated parenchymal edema within the RIGHT frontal lobe is stable in extent. No associated mass effect or midline shift. 2. No new intracranial abnormality. No new or acute intracranial hemorrhage. Electronically Signed   By: Franki Cabot M.D.   On: 11/22/2018 19:54   Mr Jeri Cos DJ Contrast  Result Date: 11/23/2018 CLINICAL DATA:  Encephalopathy. EXAM: MRI HEAD WITHOUT AND WITH CONTRAST TECHNIQUE: Multiplanar, multiecho pulse sequences of the brain and surrounding structures were obtained without and with intravenous contrast. CONTRAST:  10 cc Gadavist intravenous COMPARISON:  Brain MRI from 9 days ago FINDINGS: Brain: Subacute  hematoma in the parafalcine anterior right frontal lobe T1 and T2 hyperintensity. The hematoma measures up to 4.2 x 2 cm on axial slices. No significant adjacent edema or mass effect. Mild small vessel ischemic changes seen in the cerebral white matter and pons. No abnormal enhancement. No acute infarct, acute hemorrhage, hydrocephalus, or evident neoplasm. There are a few remote micro hemorrhages and left occipital parietal junction superficial siderosis that is also stable. Usually, amyloid angiopathy is more extensive/generalized. Vascular: Major flow voids and vascular enhancements are preserved Skull and upper cervical spine: Negative for marrow lesion Sinuses/Orbits: Bilateral cataract resection. IMPRESSION: 1. Contracting subacute right frontal hematoma without vasogenic edema or mass effect. 2. No acute finding or new abnormality. Electronically Signed   By: Monte Fantasia M.D.   On: 11/23/2018 13:09        Scheduled Meds:  amiodarone  200 mg Oral Daily   atorvastatin  10 mg Oral QHS   carvedilol  12.5 mg Oral BID WC   insulin aspart  0-5 Units Subcutaneous QHS   insulin aspart  0-9 Units Subcutaneous TID WC   mometasone-formoterol  2 puff Inhalation BID   QUEtiapine  12.5 mg Oral Once  QUEtiapine  25 mg Oral QHS   sodium chloride flush  3 mL Intravenous Once   sodium chloride flush  3 mL Intravenous Q12H   Continuous Infusions:  sodium chloride 75 mL/hr at 11/24/18 0414   valproate sodium 500 mg (11/24/18 0914)     LOS: 1 day    Time spent: 35 mins.More than 50% of that time was spent in counseling and/or coordination of care.      Shelly Coss, MD [facility] Pager [phone]  If 7PM-7AM, please contact night-coverage [email or web address] Password [facility] 11/24/2018, 11:53 AM

## 2018-11-24 NOTE — Plan of Care (Signed)
  Problem: Activity: Goal: Risk for activity intolerance will decrease Outcome: Progressing   

## 2018-11-24 NOTE — Telephone Encounter (Signed)
Noted  

## 2018-11-24 NOTE — Telephone Encounter (Signed)
Please see message. °

## 2018-11-25 ENCOUNTER — Inpatient Hospital Stay: Payer: Federal, State, Local not specified - PPO | Admitting: Adult Health

## 2018-11-25 LAB — GLUCOSE, CAPILLARY
Glucose-Capillary: 122 mg/dL — ABNORMAL HIGH (ref 70–99)
Glucose-Capillary: 116 mg/dL — ABNORMAL HIGH (ref 70–99)
Glucose-Capillary: 179 mg/dL — ABNORMAL HIGH (ref 70–99)
Glucose-Capillary: 115 mg/dL — ABNORMAL HIGH (ref 70–99)

## 2018-11-25 MED ORDER — LOSARTAN POTASSIUM 25 MG PO TABS
25.0000 mg | ORAL_TABLET | Freq: Every day | ORAL | Status: DC
  Administered 2018-11-25 – 2018-11-27 (×3): 25 mg via ORAL
  Filled 2018-11-25 (×3): qty 1

## 2018-11-25 MED ORDER — DIVALPROEX SODIUM 250 MG PO DR TAB
500.0000 mg | DELAYED_RELEASE_TABLET | Freq: Two times a day (BID) | ORAL | Status: DC
  Administered 2018-11-25 – 2018-11-27 (×5): 500 mg via ORAL
  Filled 2018-11-25 (×5): qty 2

## 2018-11-25 NOTE — H&P (Signed)
Physical Medicine and Rehabilitation Admission H&P    Chief Complaint  Patient presents with   Altered Mental Status  : HPI: Ronald [name] is a 73 year old right-handed male with history significant for right frontal ICH related to Eliquis and admitted 10/21/2018 until 10/27/2018 and discharged to home ambulating 240 feet without assistive device, atrial fibrillation followed by Dr. Dorris Carnes and no longer on anticoagulation, hypertension, chronic diastolic congestive heart failure, history of ascending thoracic aortic aneurysm followed as an outpatient., COPD maintained on CPAP and followed by Dr. Halford Chessman, chronic kidney disease stage III with creatinine 1.62, type 2 diabetes mellitus and seizure maintained on Keppra 500 mg twice daily.  Per chart review patient lives with spouse.  Two-level home with bed and bathroom on Main level.  Reportedly independent prior to admission.  Presented 11/22/2018 with increased altered mental status and poor balance leaning to the right side.  Urinalysis negative, COVID negative.  Cranial CT scan showed no acute or new intracranial hemorrhage.  Decreasing conspicuity of the right frontal hemorrhage and slight decrease in associated edema.  CT angiogram of head and neck with no emergent large vessel occlusion.  MRI of the brain completed 11/23/2018 again showing no acute findings or new abnormality.  Prior subacute hematoma measuring 4.2 x 2 cm.  No significant adjacent edema or mass-effect.  EEG negative for seizure.  Most recent echocardiogram after initial [place] 10/22/2018 with ejection fraction of 60% hyperdynamic systolic function.  Neurology follow-up patient's Keppra was changed to Depakote 500 mg twice daily.  Still with bouts of confusion and restlessness and maintained on Seroquel.  Tolerating a regular diet.  Therapy evaluations completed and patient was admitted for a comprehensive rehab program.  Review of Systems  Constitutional: Negative for chills and  fever.  Eyes: Negative for blurred vision and double vision.  Respiratory: Negative for cough.        Occasional dyspnea  Cardiovascular: Positive for palpitations and leg swelling. Negative for chest pain.  Gastrointestinal: Positive for constipation. Negative for heartburn, nausea and vomiting.  Genitourinary: Positive for urgency. Negative for dysuria, flank pain and hematuria.  Musculoskeletal: Positive for myalgias.  Neurological: Positive for dizziness.  All other systems reviewed and are negative.  Past Medical History:  Diagnosis Date   Allergic rhinitis    Anticoagulant long-term use    eliquis   Cardiomyopathy due to systemic disease ([place])    followed by dr harding   COPD with emphysema [facility])    pulmologist-  dr Halford Chessman   Dyspnea    occasional per pt   History of colon polyps    tubular adenoma 2013   History of gout    09-05-2017 last flare-up  05/ 2019 3 wks ago, feet   History of sepsis 02/18/2017   per d/c note probable uti, acute chf, acute renal failure, hypoxia   Hyperlipidemia    Hyperplasia of prostate with lower urinary tract symptoms (LUTS)    Hypertension    OSA on CPAP    per study 08-03-2004  Severe OSA   Persistent atrial fibrillation    cardiologist --  dr Dorris Carnes--  post cardioversion 05-07-2014   Prostate cancer [facility]) urologsit-  dr Alyson Ingles--- as of 05-21-2017 per pt last PSA 11 approx.   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 ([place])  pulmologist-  dr Halford Chessman   Seasonal allergies    Sigmoid diverticulosis    Systolic and diastolic CHF, chronic [facility])    cardiologist-  [name]   Tinea versicolor    Type 2 diabetes mellitus ([place])    Wears hearing aid in both ears    Past Surgical History:  Procedure Laterality Date   CARDIOVERSION N/A 05/07/2014    Procedure: CARDIOVERSION;  Surgeon: [name], MD;  Location: [place];  Service: Cardiovascular;  Laterality: N/A;   CARDIOVERSION N/A 09/09/2014   Procedure: CARDIOVERSION;  Surgeon: Lelon Perla, MD;  Location: [facility] ENDOSCOPY;  Service: Cardiovascular;  Laterality: N/A;  successfully   CATARACT EXTRACTION W/ INTRAOCULAR LENS  IMPLANT, BILATERAL  08 and 09/  2018   COLONOSCOPY  last one 06-07-2011   GOLD SEED IMPLANT N/A 09/09/2017   Procedure: GOLD SEED IMPLANT;  Surgeon: [name], MD;  Location[facility];  Service: Urology;  Laterality: N/A;   HYDROCELE EXCISION Bilateral 09/26/2015   Procedure: HYDROCELECTOMY ADULT;  Surgeon: [name], MD;  Location: [facility];  Service: Urology;  Laterality: Bilateral;   LAMINECTOMY AND MICRODISCECTOMY LUMBAR SPINE  12-23-2003   Left L5 -- S1   LAPAROSCOPIC BILATERAL INGUINAL HERNIA REPAIR/  UMBILICAL HERNIA REPAIR WITH MESH/  ASPIRATION LEFT HYDROCELE  07-11-2012   NM MYOVIEW LTD  05/18/2014   Low risk study. Normal perfusion: No ischemia or infarction. Mild LV dysfunction - 46% (does not correlate with echocardiographic EF of 50-55%)   PROSTATE BIOPSY  05/15/12   Clinically both [place]   SPACE OAR INSTILLATION N/A 09/09/2017   Procedure: SPACE OAR INSTILLATION;  Surgeon: [name], MD;  Location: [facility];  Service: Urology;  Laterality: N/A;   TEE WITHOUT CARDIOVERSION N/A 05/07/2014   Procedure: TRANSESOPHAGEAL ECHOCARDIOGRAM (TEE);  Surgeon: [name], MD;  Location: [facility] ENDOSCOPY;  Service: Cardiovascular;  Laterality: N/A;   mild atherosclerosis plaque of aorta,  mild AR, MR, and TR,  no cardiac source of emboli   TONSILLECTOMY  as child   TRANSTHORACIC ECHOCARDIOGRAM  11/'18; 1/'19   a) In setting of sepsis: EF of 40-45%.  Diffuse hypokinesis.  No RWMA.  Biatrial enlargement.;; b) ** f/u Jan 2019: Normal LVF 55-60%** , mildly dilated aortic root(38 mm)  and ascending aorta (44 mm). Compared to prior echo, LVEF has improved.   TRANSURETHRAL RESECTION OF PROSTATE N/A 05/30/2017   Procedure: TRANSURETHRAL RESECTION OF THE PROSTATE (TURP);  Surgeon: [name], MD;  Location: [facility];  Service: Urology;  Laterality: N/A;   Family History  Problem Relation Age of Onset   Breast cancer Mother    Stroke Other        Unknown    Ovarian cancer Sister    Colon cancer Neg Hx    Esophageal cancer Neg Hx    Rectal cancer Neg Hx    Stomach cancer Neg Hx    Social History:  reports that he has been smoking cigarettes. He has a 15.00 pack-year smoking history. He has never used smokeless tobacco. He reports current alcohol use of about 14.0 standard drinks of alcohol per week. He reports that he does not use drugs. Allergies: No Known Allergies Medications Prior to Admission  Medication Sig Dispense Refill   albuterol (VENTOLIN HFA) 108 (90 Base) MCG/ACT inhaler Inhale 2 puffs into the lungs every 6 (six) hours as needed for wheezing or shortness of breath. 1 Inhaler 5   amiodarone (  PACERONE) 200 MG tablet Take 1 tablet (200 mg total) by mouth daily. 90 tablet 2   aspirin EC 81 MG EC tablet Take 1 tablet (81 mg total) by mouth daily.     atorvastatin (LIPITOR) 10 MG tablet TAKE 1 TABLET BY MOUTH DAILY EACH EVENING (Patient taking differently: Take 10 mg by mouth at bedtime. ) 90 tablet 1   budesonide-formoterol (SYMBICORT) 160-4.5 MCG/ACT inhaler Inhale 2 puffs into the lungs 2 (two) times daily. 1 Inhaler 12   butalbital-acetaminophen-caffeine (FIORICET) 50-325-40 MG tablet Take 1 tablet by mouth every 8 (eight) hours as needed for headache. 14 tablet 0   carvedilol (COREG) 12.5 MG tablet TAKE 1 TABLET (12.5 MG TOTAL) BY MOUTH 2 (TWO) TIMES DAILY. 180 tablet 1   colchicine 0.6 MG tablet Take 1-2 tablets (0.6-1.2 mg total) by mouth See admin instructions. Take 1.2 mg by mouth at onset of gout flare, then 0.6 mg two times a day as  needed for flare(s)     furosemide (LASIX) 40 MG tablet TAKE 1-2 TABLETS DAILY AS NEEDED. (Patient taking differently: Take 20-40 mg by mouth daily as needed for fluid. TAKE 1-2 TABLETS DAILY AS NEEDED.) 180 tablet 1   KLOR-CON M20 20 MEQ tablet TAKE 2 TABLETS BY MOUTH EVERY DAY (Patient taking differently: Take 40 mEq by mouth daily. ) 180 tablet 1   levETIRAcetam (KEPPRA) 500 MG tablet Take 1 tablet (500 mg total) by mouth 2 (two) times daily. 30 tablet 2   losartan (COZAAR) 25 MG tablet TAKE 1 TABLET BY MOUTH EVERY DAY (Patient taking differently: Take 25 mg by mouth daily. ) 90 tablet 3   metFORMIN (GLUCOPHAGE) 500 MG tablet Take 2 tablets (1,000 mg total) by mouth 2 (two) times daily with a meal. 360 tablet 0   metoprolol tartrate (LOPRESSOR) 25 MG tablet Take  1 tablet if  you have breakthrough atrial fib with extra dose of 200 mg Amiodarone. (Patient taking differently: Take 25 mg by mouth See admin instructions. Take 25 mg by mouth for breakthrough A-Fib (with an extra dose of 200 mg Amiodarone)) 10 tablet 6   montelukast (SINGULAIR) 10 MG tablet Take 1 tablet (10 mg total) by mouth every evening.     nicotine (NICODERM CQ - DOSED IN MG/24 HOURS) 14 mg/24hr patch Place 1 patch (14 mg total) onto the skin daily. 28 patch 0   PRESCRIPTION MEDICATION CPAP- At bedtime     Semaglutide,0.25 or 0.5MG/DOS, (OZEMPIC, 0.25 OR 0.5 MG/DOSE,) 2 MG/1.5ML SOPN Inject 0.5 mg into the skin every Sunday.     triamcinolone cream (KENALOG) 0.1 % Apply 1 application topically daily as needed (to affected sites).      azelastine (ASTELIN) 0.1 % nasal spray Place 1 spray into both nostrils 2 (two) times daily as needed for rhinitis or allergies. (Patient not taking: Reported on 11/22/2018)     Blood Glucose Monitoring Suppl (ACCU-CHEK AVIVA PLUS) w/Device KIT Use blood glucose machine as instructed on your strips 1 kit 1   glucose blood (ACCU-CHEK AVIVA PLUS) test strip Test 2 times daily dx e11.9 100  each 3   Lancets (ACCU-CHEK SOFT TOUCH) lancets Check blood sugars once per day. DX: E11.9 100 each 2    Drug Regimen Review Drug regimen was reviewed and remains appropriate with no significant issues identified  Home: Home Living Family/patient expects to be discharged to:: Private residence Living Arrangements: Spouse/significant other Available Help at Discharge: Family, Available 24 hours/day Type of Home: House Home Access: Level  entry, Elevator Home Layout: Two level, Able to live on main level with bedroom/bathroom Bathroom Shower/Tub: Multimedia programmer: Handicapped height Home Equipment: [place] - single point, Environmental consultant - 4 wheels  Lives With: Spouse   Functional History: Prior Function Level of Independence: Independent 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 VCs and tactile cues required for foot placement off EOB. Physical assist required for trunk elevation. Transfers Overall transfer level: Needs assistance Equipment used: Rolling walker (2 wheeled) Transfers: Sit to/from Stand Sit to Stand: Mod assist, +2 physical assistance, +2 safety/equipment General transfer comment: Pt requires mod A +2 for physical assistance and safety. Requires lift assistance for sit to stand transfer with RW. Pt required VCs for hand placement, however, continued to pull up on RW. Ambulation/Gait Ambulation/Gait assistance: Min assist, +2 safety/equipment Gait Distance (Feet): 100 Feet Assistive device: Rolling walker (2 wheeled) Gait Pattern/deviations: Step-through pattern, Decreased step length - right, Decreased step length - left, Staggering right, Drifts right/left General Gait Details:  Pt ambulates with a step through pattern with RW & +2 assist. Pt requires VCs to remain within RW and to increase awareness of R side deficits.  Pt running into objects on R side during ambulation. Pt easily distracted by catheter and required multiple cues for redirection to task. Gait velocity: decreased Gait velocity interpretation: 1.31 - 2.62 ft/sec, indicative of limited community ambulator    ADL: ADL Overall ADL's : Needs assistance/impaired Eating/Feeding: Modified independent, Sitting Grooming: Set up, Sitting Upper Body Bathing: Set up, Sitting Lower Body Bathing: Moderate assistance, Sitting/lateral leans, Sit to/from stand, Cueing for safety Upper Body Dressing : Set up, Sitting Lower Body Dressing: Moderate assistance, Sitting/lateral leans, Sit to/from stand Toilet Transfer: Minimal assistance, Ambulation, +2 for physical assistance, +2 for safety/equipment Toileting- Clothing Manipulation and Hygiene: Moderate assistance, Sitting/lateral lean, Sit to/from stand Functional mobility during ADLs: Minimal assistance, +2 for physical assistance, +2 for safety/equipment, Rolling walker, Cueing for safety General ADL Comments: Pt modA overall for ADL. Pt grooming at sink with set-upA overall and minguardA for stability as pt with severe posterior lean and unawareness of how exaggerated it was.  Cognition: Cognition Overall Cognitive Status: Impaired/Different from baseline Orientation Level: Oriented to person Cognition Arousal/Alertness: Awake/alert Behavior During Therapy: Agitated, Impulsive Overall Cognitive Status: Impaired/Different from baseline Area of Impairment: Orientation, Attention, Memory, Following commands, Safety/judgement, Awareness, Problem solving Orientation Level: Disoriented to, Place, Situation Current Attention Level: Focused Memory: Decreased short-term memory, Decreased recall of precautions Following Commands: Follows one step commands  inconsistently, Follows one step commands with increased time Safety/Judgement: Decreased awareness of safety, Decreased awareness of deficits Awareness: Intellectual Problem Solving: Slow processing, Requires tactile cues, Requires verbal cues, Difficulty sequencing, Decreased initiation General Comments: Pt demonstrates decline in cognition with apparent hallucination while therapy present. Pt requires multiple cues to complete tasks and is slow to respond and process. Pt unable to state current location or situation. Pt became agitated with safety cues during ambulation, but was easily redirected.  Physical Exam: Blood pressure (!) 150/91, pulse 79, temperature 98.8 F (37.1 C), temperature source Oral, resp. rate 19, height _0  (1.803 m), weight 103.7 kg, SpO2 92 %. Physical Exam  Constitutional: He appears well-developed and well-nourished.  HENT:  Head: Normocephalic and atraumatic.  Eyes: Pupils are equal, round, and reactive to light. EOM are normal.  Neck: Normal range of motion. No thyromegaly present.  Cardiovascular: Normal rate and regular rhythm. Exam reveals no friction rub.  No murmur heard. Respiratory: Effort normal and breath sounds normal. No respiratory distress. He has no wheezes.  GI: Bowel sounds are normal. There is no abdominal tenderness.  Protuberant abdomen  Musculoskeletal:     Comments: Pt with tender midfoot and ankles. No obvious warmth or swelling  Neurological: He is alert.  Patient is alert sitting up at bedside commode.  Makes eye contact with examiner.  Does follow basic commands. Oriented to self only. Did not know that he had a wife or where he was. Distracted. Moves all 4 limbs but hesitant to move feet d/t pain.   Skin: Skin is warm and dry.  Psychiatric:  Pleasantly confused    Results for orders placed or performed during the hospital encounter of 11/21/18 (from the past 48 hour(s))  Glucose, capillary     Status: Abnormal   Collection  Time: 11/25/18 11:43 AM  Result Value Ref Range   Glucose-Capillary 116 (H) 70 - 99 mg/dL  Glucose, capillary     Status: Abnormal   Collection Time: 11/25/18  3:59 PM  Result Value Ref Range   Glucose-Capillary 179 (H) 70 - 99 mg/dL  Glucose, capillary     Status: Abnormal   Collection Time: 11/25/18  9:26 PM  Result Value Ref Range   Glucose-Capillary 115 (H) 70 - 99 mg/dL  Glucose, capillary     Status: Abnormal   Collection Time: 11/26/18  6:14 AM  Result Value Ref Range   Glucose-Capillary 112 (H) 70 - 99 mg/dL  CBC with Differential/Platelet     Status: None   Collection Time: 11/26/18  8:31 AM  Result Value Ref Range   WBC 6.9 4.0 - 10.5 K/uL   RBC 4.51 4.22 - 5.81 MIL/uL   Hemoglobin 14.9 13.0 - 17.0 g/dL   HCT 43.5 39.0 - 52.0 %   MCV 96.5 80.0 - 100.0 fL   MCH 33.0 26.0 - 34.0 pg   MCHC 34.3 30.0 - 36.0 g/dL   RDW 11.9 11.5 - 15.5 %   Platelets 188 150 - 400 K/uL   nRBC 0.0 0.0 - 0.2 %   Neutrophils Relative % 76 %   Neutro Abs 5.3 1.7 - 7.7 K/uL   Lymphocytes Relative 12 %   Lymphs Abs 0.9 0.7 - 4.0 K/uL   Monocytes Relative 9 %   Monocytes Absolute 0.6 0.1 - 1.0 K/uL   Eosinophils Relative 1 %   Eosinophils Absolute 0.1 0.0 - 0.5 K/uL   Basophils Relative 1 %   Basophils Absolute 0.0 0.0 - 0.1 K/uL   Immature Granulocytes 1 %   Abs Immature Granulocytes 0.04 0.00 - 0.07 K/uL    Comment: Performed at [facility], [address] [place],  [address]  Basic metabolic panel     Status: Abnormal   Collection Time: 11/26/18  8:31 AM  Result Value Ref Range   Sodium 140 135 - 145 mmol/L   Potassium 3.5 3.5 - 5.1 mmol/L   Chloride 105 98 - 111 mmol/L   CO2 22 22 - 32 mmol/L   Glucose, Bld 129 (H) 70 - 99 mg/dL   BUN 17 8 - 23 mg/dL   Creatinine, Ser 1.30 (H) 0.61 - 1.24 mg/dL   Calcium 8.8 (L) 8.9 -  10.3 mg/dL   GFR calc non Af Amer 54 (L) >60 mL/min   GFR calc Af Amer >60 >60 mL/min   Anion gap 13 5 - 15    Comment: Performed at [facility] [address], [place], [place] [address]  Ammonia     Status: None   Collection Time: 11/26/18 11:05 AM  Result Value Ref Range   Ammonia 32 9 - 35 umol/L    Comment: Performed at [facility], [place] [address], [place], [place] [address]  Valproic acid level     Status: Abnormal   Collection Time: 11/26/18 11:05 AM  Result Value Ref Range   Valproic Acid Lvl 41 (L) 50.0 - 100.0 ug/mL    Comment: Performed at [facility] [address] [place], [place] [address]  Glucose, capillary     Status: Abnormal   Collection Time: 11/26/18 11:38 AM  Result Value Ref Range   Glucose-Capillary 135 (H) 70 - 99 mg/dL   Comment 1 Notify RN    Comment 2 Document in Chart   Glucose, capillary     Status: Abnormal   Collection Time: 11/26/18  3:25 PM  Result Value Ref Range   Glucose-Capillary 112 (H) 70 - 99 mg/dL   Comment 1 Notify RN    Comment 2 Document in Chart   Glucose, capillary     Status: Abnormal   Collection Time: 11/26/18  9:25 PM  Result Value Ref Range   Glucose-Capillary 119 (H) 70 - 99 mg/dL  Basic metabolic panel     Status: Abnormal   Collection Time: 11/27/18  3:42 AM  Result Value Ref Range   Sodium 137 135 - 145 mmol/L   Potassium 3.1 (L) 3.5 - 5.1 mmol/L   Chloride 104 98 - 111 mmol/L   CO2 23 22 - 32 mmol/L   Glucose, Bld 119 (H) 70 - 99 mg/dL   BUN 19 8 - 23 mg/dL   Creatinine, Ser 1.32 (H) 0.61 - 1.24 mg/dL   Calcium 8.4 (L) 8.9 - 10.3 mg/dL   GFR calc non Af Amer 53 (L) >60 mL/min   GFR calc Af Amer >60 >60 mL/min   Anion gap 10 5 - 15    Comment: Performed at [facility], [place] [address] [place], [place] [address]  Glucose, capillary     Status: Abnormal   Collection Time: 11/27/18  6:07 AM  Result Value Ref Range   Glucose-Capillary 123 (H) 70 - 99 mg/dL   Comment 1 Notify RN    No results found.     Medical Problem List and Plan: 1.  Decreased functional ability with altered mental status secondary to recent right  frontal ICH with seizure and associated encephalopathy   -admit to inpatient rehab 2.  Antithrombotics: -DVT/anticoagulation: SCD.  Eliquis on hold due to [place].  -antiplatelet therapy: N/A 3. Pain Management: Hydrocodone 1 tablet every 6 hours as needed moderate pain 4. Mood: Provide emotional support  -antipsychotic agents: Seroquel 25 mg nightly with backup dose    -sleep chart 5. Neuropsych: This patient is not capable of making decisions on his own behalf. 6. Skin/Wound Care: Routine skin checks 7. Fluids/Electrolytes/Nutrition: routine in and outs with follow up chemestries 8.  Atrial fibrillation.  No longer on anticoagulation due to [place].  Continue amiodarone 200 mg daily.  Follow-up cardiology service Dr. Harrington Challenger. HR controlled at present 9.  Seizure disorder.  EEG negative.  Keppra changed to  Depakote 500 mg every 12 hours 10.  Chronic diastolic congestive heart failure.  Coreg 12.5 mg twice daily, Cozaar 25 mg daily.  Monitor for any signs of fluid overload  -check daily weights 11.  CKD stage III.  Creatinine baseline 1.62.  Follow-up chemistries 12.  COPD/CPAP.  Follow-up Dr. Halford Chessman.  Continue Dulera 13.  Type 2 diabetes mellitus.  Hemoglobin A1c 6.9.  SSI.  Check blood sugars before meals and at bedtime.  Patient on Glucophage 1000 mg twice daily prior to admission.    -resume glucophage as needed 14.  History of ascending thoracic aortic aneurysm.  Follow-up as outpatient 15.  Hyperlipidemia.  Lipitor    Lavon Paganini Angiulli, PA-C 11/27/2018

## 2018-11-25 NOTE — Progress Notes (Signed)
Inpatient Rehabilitation-Admissions Coordinator   Pt in soft waist belt restraint and bilateral wrist restraints this AM. Per RN, pt had an eventful night and agitation. Telesitter and wife currently in room. AC discussed that we will continue to follow for improvement in agitation and will continue to see how pt does functionally today.   AC will follow up tomorrow.   Jhonnie Garner, OTR/L  Rehab Admissions Coordinator  [phone] 11/25/2018 10:58 AM

## 2018-11-25 NOTE — Consult Note (Signed)
   [facility] CM Inpatient Consult   11/25/2018  Ronald [name] 1945-06-26 [id number]   Patient screened for high risk score of 24% for unplanned readmission with a less than 30 day readmission in the Medicare ACO.  Review of patient's medical record reveals patient is currently pursuing an inpatient rehabilitation admission.    Plan:  Follow for disposition, if transitions to inpatient rehabilitation can follow up with Rehab care management team closer to transition for disposition needs, if and when appropriate.   Please place a [facility] Care Management consult as appropriate and for questions contact:   Natividad Brood, RN BSN [facility] Liaison  [phone] business mobile phone Toll free office [phone]  Fax number: [phone] [email or web address]

## 2018-11-25 NOTE — Progress Notes (Signed)
Physical Therapy Treatment Patient Details Name: Ronald [name] MRN: [id number] DOB: 10-03-1945 Today's Date: 11/25/2018    History of Present Illness Pt is a 73 y/o male who presents with progressing confusion and unsteady gait. Pt with recent admission for R frontal ICH with no new bleed seen on imaging. PMH significant for seizure, HTN, diastolic heart failure, DMII, COPD, CKD III.     PT Comments    Pt progressing towards physical therapy goals. Continues to demonstrate R side inattention, R side weakness, balance deficits, and decreased cognition. Noted pt with fewer collisions with obstacles in the hallway, however continues to require consistent min assist for balance support and safety with RW. Wife present throughout session and was educated on acute rehab's recommendation for follow-up therapies at d/c. Continue to recommend CIR level therapies to maximize functional independence and decrease risk for falls upon return home with wife.     Follow Up Recommendations  CIR;Supervision/Assistance - 24 hour     Equipment Recommendations  None recommended by PT    Recommendations for Other Services       Precautions / Restrictions Precautions Precautions: Fall Restrictions Weight Bearing Restrictions: No    Mobility  Bed Mobility Overal bed mobility: Needs Assistance Bed Mobility: Supine to Sit     Supine to sit: Min assist     General bed mobility comments: Pt requires min A to perform supine to sit. With supine to sit transfer, pt reached for therapist's hand to pull up to sitting. Assistance includes VCs to bring legs to side of bed and physical assistance with trunk elevation.  Transfers Overall transfer level: Needs assistance Equipment used: Rolling walker (2 wheeled) Transfers: Sit to/from Stand Sit to Stand: +2 physical assistance;+2 safety/equipment;Min assist         General transfer comment: Pt requires min A with sit to stand transfer for lift  assistance.  Ambulation/Gait Ambulation/Gait assistance: Min assist Gait Distance (Feet): 200 Feet Assistive device: Rolling walker (2 wheeled) Gait Pattern/deviations: Step-through pattern;Decreased step length - right;Decreased step length - left;Staggering right;Drifts right/left Gait velocity: decreased Gait velocity interpretation: 1.31 - 2.62 ft/sec, indicative of limited community ambulator General Gait Details: Pt ambulates with a step through pattern with RW & +2 assist. Pt requires VCs to remain with in RW and to increase awareness of R side deficits.    Stairs             Wheelchair Mobility    Modified Rankin (Stroke Patients Only) Modified Rankin (Stroke Patients Only) Pre-Morbid Rankin Score: No symptoms Modified Rankin: Moderately severe disability     Balance Overall balance assessment: Needs assistance Sitting-balance support: Feet supported;No upper extremity supported Sitting balance-Leahy Scale: Fair   Postural control: Posterior lean Standing balance support: Bilateral upper extremity supported Standing balance-Leahy Scale: Poor                              Cognition Arousal/Alertness: Awake/alert Behavior During Therapy: WFL for tasks assessed/performed Overall Cognitive Status: Impaired/Different from baseline Area of Impairment: Orientation;Attention;Memory;Following commands;Safety/judgement;Awareness;Problem solving                 Orientation Level: Disoriented to;Place;Situation Current Attention Level: Focused Memory: Decreased recall of precautions;Decreased short-term memory Following Commands: Follows one step commands inconsistently;Follows one step commands with increased time;Follows multi-step commands inconsistently;Follows multi-step commands with increased time Safety/Judgement: Decreased awareness of safety;Decreased awareness of deficits Awareness: Intellectual Problem Solving: Slow processing;Decreased  initiation;Difficulty sequencing;Requires verbal  cues;Requires tactile cues General Comments: Pt delayed in response to commands. Shows slow processing to complete tasks. Pt able to state at [facility], but did not state in hospital. Pt had increased time to initate tasks. Pt had difficulty multi tasking. When asked question while ambulating, Pt had to stop to think about answers.      Exercises      General Comments        Pertinent Vitals/Pain Pain Assessment: No/denies pain    Home Living                      Prior Function            PT Goals (current goals can now be found in the care plan section) Acute Rehab PT Goals PT Goal Formulation: With patient Time For Goal Achievement: 12/08/18 Potential to Achieve Goals: Good Progress towards PT goals: Progressing toward goals    Frequency    Min 4X/week      PT Plan Current plan remains appropriate    Co-evaluation              AM-PAC PT "6 Clicks" Mobility   Outcome Measure  Help needed turning from your back to your side while in a flat bed without using bedrails?: A Little Help needed moving from lying on your back to sitting on the side of a flat bed without using bedrails?: A Little Help needed moving to and from a bed to a chair (including a wheelchair)?: A Lot Help needed standing up from a chair using your arms (e.g., wheelchair or bedside chair)?: A Lot Help needed to walk in hospital room?: A Little Help needed climbing 3-5 steps with a railing? : A Lot 6 Click Score: 15    End of Session Equipment Utilized During Treatment: Gait belt Activity Tolerance: Patient tolerated treatment well Patient left: in chair;with restraints reapplied;with family/visitor present;with call bell/phone within reach;with chair alarm set Nurse Communication: Mobility status PT Visit Diagnosis: Unsteadiness on feet (R26.81);Other abnormalities of gait and mobility (R26.89);Muscle weakness (generalized)  (M62.81)     Time: 8185-6314 PT Time Calculation (min) (ACUTE ONLY): 41 min  Charges:  $Gait Training: 23-37 mins $Therapeutic Activity: 8-22 mins                    Christophe Louis, SPT  Cyrah Mclamb 11/25/2018, 1:52 PM

## 2018-11-25 NOTE — Progress Notes (Signed)
Pt. Refused cpap for tonight. 

## 2018-11-25 NOTE — Progress Notes (Signed)
PROGRESS NOTE    Ronald [name]  ZOX:[id number] DOB: [birth year] DOA: 11/21/2018 PCP: [name], MD   Brief Narrative:  Patient is a 73 year old male with history of recent right frontal ICH, A. fib not on anticoagulation, hypertension, chronic diastolic CHF, COPD, CKD stage III, diabetes type 2, seizure disorder who presents from home for evaluation of worsening confusion and gait problem.  Neurology has been consulted and following. There was also report of one seizure episode.  CT imaging of the brain did not show any acute intracranial abnormality and  shows evolution of right frontal intraparenchymal hemorrhage. Patient admitted for the evaluation of altered mental status. Underwent MRI without finding of any acute intracranial abnormalities.  PT/OT recommended CIR.  CIR consulted.   Patient is medically stable for discharge to CIR as soon as the bed is available.  Assessment & Plan:   Principal Problem:   Acute encephalopathy Active Problems:   Obstructive sleep apnea   Essential hypertension   Type 2 diabetes mellitus with hyperglycemia (HCC)   Paroxysmal atrial fibrillation (HCC)   Chronic diastolic CHF (congestive heart failure) (HCC)   ICH (intracerebral hemorrhage) (HCC) w/ SAH while on Eliquis   Seizures ([place]), secondsry to ICH   CKD (chronic kidney disease), stage III ([place])   Thoracic ascending aortic aneurysm (HCC)   AMS (altered mental status)   Altered mental status: Suspected to be from [place] versus behavioral/personality changes secondary to frontal lobe hematoma.  Keppra has been discontinued.  Started on Depakote. EEG did not show any epileptiform activities. MRI did not show any acute intracranial abnormalities. His mental status waxes and wanes. Participated with PT/OT.   This morning he was participating with PT. Neurology signed off and recommended to follow-up as an outpatient.  Recent intracerebral hemorrhage: Currently stable.  Brain imaging   finding as above.  Seizures: Reported of an episode of seizure recently.  Was on Keppra at home.  Keppra changed to Depakote.  Continue seizure precaution.  Paroxysmal A. fib: Currently in sinus rhythm.CHADS-VASc 7.  Not on anticoagulation due to history of ICH.  Continue amiodarone.  Rate is well controlled.  Hypertension: Currently blood stable.  Continue to monitor blood pressure.  Continue current medicines  Type 2 diabetes mellitus: Hemoglobin A1c 6.9 in July 2020.  On metformin, Ozempic at home .Continue sliding scale insulin here.  Chronic diastolic CHF: Currently compensated.  Lasix on hold due to AKI.   OSA: Continue CPAP at night.  History of ascending thoracic aortic aneurysm:  Imaging showed  4.1 cm noted incidentally on CTA neck in ED.  Follow-up imaging recommended as an outpatient.  Debility/deconditioning: PT/OT evaluation requested.recommended CIR.        DVT prophylaxis: SCD Code Status: Full Family Communication: Wife on phone Disposition Plan: CIR as soon as bed is available  Consultants: Neurology  Procedures: EEG  Antimicrobials:  Anti-infectives (From admission, onward)   None      Subjective:  Patient seen and examined the bedside this morning.  He was participating with physical therapy.  Currently hemodynamically stable.  His mental status is waxed and waned.  Sometimes he became comfortable, cooperative and oriented and other times, he becomes confused and agitated.  Objective: Vitals:   11/25/18 0354 11/25/18 0455 11/25/18 0700 11/25/18 1100  BP: (!) 168/108  (!) 164/115 (!) 163/101  Pulse: 74  69 65  Resp: 18  18 18   Temp: 98 F (36.7 C)  97.6 F (36.4 C) 97.7 F (36.5 C)  TempSrc:  Axillary  Axillary Axillary  SpO2: 100%  93% 92%  Weight:  102.4 kg    Height:        Intake/Output Summary (Last 24 hours) at 11/25/2018 1417 Last data filed at 11/25/2018 1100 Gross per 24 hour  Intake 55 ml  Output 1900 ml  Net -1845 ml   Filed  Weights   11/24/18 0625 11/24/18 1639 11/25/18 0455  Weight: 101.5 kg 101.5 kg 102.4 kg    Examination:  General exam: Comfortable, alert and awake,workign with PT HEENT:PERRL,Oral mucosa moist, Ear/Nose normal on gross exam Respiratory system: Bilateral equal air entry, normal vesicular breath sounds, no wheezes or crackles  Cardiovascular system: S1 & S2 heard, RRR. No JVD, murmurs, rubs, gallops or clicks. No pedal edema. Gastrointestinal system: Abdomen is nondistended, soft and nontender. No organomegaly or masses felt. Normal bowel sounds heard. Central nervous system: Alert, awake,  extremities: No edema, no clubbing ,no cyanosis, distal peripheral pulses palpable. Skin: No rashes, lesions or ulcers,no icterus ,no pallor MSK: Normal muscle bulk,tone ,power   Data Reviewed: I have personally reviewed following labs and imaging studies  CBC: Recent Labs  Lab 11/21/18 1644 11/22/18 1332  WBC 5.2 4.7  NEUTROABS 3.4  --   HGB 14.8 14.2  HCT 43.1 42.5  MCV 98.0 98.4  PLT 207 462   Basic Metabolic Panel: Recent Labs  Lab 11/21/18 1644 11/22/18 1332 11/23/18 0448 11/24/18 0810  NA 137 140 137 138  K 4.4 4.6 4.2 3.5  CL 104 106 102 106  CO2 22 23 24 24   GLUCOSE 135* 134* 174* 126*  BUN 18 16 12 10   CREATININE 1.62* 1.24 1.31* 1.17  CALCIUM 9.3 9.1 8.6* 8.4*   GFR: Estimated Creatinine Clearance: 68.5 mL/min (by C-G formula based on SCr of 1.17 mg/dL). Liver Function Tests: Recent Labs  Lab 11/21/18 1644  AST 18  ALT 21  ALKPHOS 82  BILITOT 0.8  PROT 6.5  ALBUMIN 3.8   No results for input(s): LIPASE, AMYLASE in the last 168 hours. No results for input(s): AMMONIA in the last 168 hours. Coagulation Profile: Recent Labs  Lab 11/21/18 1644  INR 1.0   Cardiac Enzymes: No results for input(s): CKTOTAL, CKMB, CKMBINDEX, TROPONINI in the last 168 hours. BNP (last 3 results) No results for input(s): PROBNP in the last 8760 hours. HbA1C: No results for  input(s): HGBA1C in the last 72 hours. CBG: Recent Labs  Lab 11/24/18 1121 11/24/18 1626 11/24/18 2135 11/25/18 0611 11/25/18 1143  GLUCAP 171* 114* 156* 122* 116*   Lipid Profile: No results for input(s): CHOL, HDL, LDLCALC, TRIG, CHOLHDL, LDLDIRECT in the last 72 hours. Thyroid Function Tests: No results for input(s): TSH, T4TOTAL, FREET4, T3FREE, THYROIDAB in the last 72 hours. Anemia Panel: No results for input(s): VITAMINB12, FOLATE, FERRITIN, TIBC, IRON, RETICCTPCT in the last 72 hours. Sepsis Labs: No results for input(s): PROCALCITON, LATICACIDVEN in the last 168 hours.  Recent Results (from the past 240 hour(s))  SARS CORONAVIRUS 2 Nasal Swab Aptima Multi Swab     Status: None   Collection Time: 11/22/18  4:58 AM   Specimen: Aptima Multi Swab; Nasal Swab  Result Value Ref Range Status   SARS Coronavirus 2 NEGATIVE NEGATIVE Final    Comment: (NOTE) SARS-CoV-2 target nucleic acids are NOT DETECTED. The SARS-CoV-2 RNA is generally detectable in upper and lower respiratory specimens during the acute phase of infection. Negative results do not preclude SARS-CoV-2 infection, do not rule out co-infections with other pathogens, and  should not be used as the sole basis for treatment or other patient management decisions. Negative results must be combined with clinical observations, patient history, and epidemiological information. The expected result is Negative. Fact Sheet for Patients: SugarRoll.be Fact Sheet for Healthcare Providers: https://www.woods-mathews.com/ This test is not yet approved or cleared by the [place] FDA and  has been authorized for detection and/or diagnosis of SARS-CoV-2 by FDA under an Emergency Use Authorization (EUA). This EUA will remain  in effect (meaning this test can be used) for the duration of the COVID-19 declaration under Section 56 4(b)(1) of the Act, 21 U.S.C. section 360bbb-3(b)(1),  unless the authorization is terminated or revoked sooner. Performed at [facility], [place] [address], [place], [place] [address]          Radiology Studies: No results found.      Scheduled Meds: . amiodarone  200 mg Oral Daily  . atorvastatin  10 mg Oral QHS  . carvedilol  12.5 mg Oral BID WC  . divalproex  500 mg Oral Q12H  . insulin aspart  0-5 Units Subcutaneous QHS  . insulin aspart  0-9 Units Subcutaneous TID WC  . mometasone-formoterol  2 puff Inhalation BID  . QUEtiapine  12.5 mg Oral Once  . QUEtiapine  25 mg Oral QHS  . sodium chloride flush  3 mL Intravenous Once  . sodium chloride flush  3 mL Intravenous Q12H   Continuous Infusions:    LOS: 2 days    Time spent: 35 mins.More than 50% of that time was spent in counseling and/or coordination of care.      Shelly Coss, MD [facility] Pager [phone]  If 7PM-7AM, please contact night-coverage [email or web address] Password [facility] 11/25/2018, 2:17 PM

## 2018-11-26 ENCOUNTER — Encounter: Payer: Self-pay | Admitting: Adult Health

## 2018-11-26 ENCOUNTER — Ambulatory Visit: Payer: Medicare Other | Admitting: Physical Therapy

## 2018-11-26 LAB — GLUCOSE, CAPILLARY
Glucose-Capillary: 112 mg/dL — ABNORMAL HIGH (ref 70–99)
Glucose-Capillary: 135 mg/dL — ABNORMAL HIGH (ref 70–99)
Glucose-Capillary: 112 mg/dL — ABNORMAL HIGH (ref 70–99)
Glucose-Capillary: 119 mg/dL — ABNORMAL HIGH (ref 70–99)

## 2018-11-26 LAB — BASIC METABOLIC PANEL
Anion gap: 13 (ref 5–15)
BUN: 17 mg/dL (ref 8–23)
CO2: 22 mmol/L (ref 22–32)
Calcium: 8.8 mg/dL — ABNORMAL LOW (ref 8.9–10.3)
Chloride: 105 mmol/L (ref 98–111)
Creatinine, Ser: 1.3 mg/dL — ABNORMAL HIGH (ref 0.61–1.24)
GFR calc Af Amer: >60 mL/min (ref >60)
GFR calc non Af Amer: 54 mL/min — ABNORMAL LOW (ref >60)
Glucose, Bld: 129 mg/dL — ABNORMAL HIGH (ref 70–99)
Potassium: 3.5 mmol/L (ref 3.5–5.1)
Sodium: 140 mmol/L (ref 135–145)

## 2018-11-26 LAB — CBC WITH DIFFERENTIAL/PLATELET
Abs Immature Granulocytes: 0.04 10*3/uL (ref 0.00–0.07)
Basophils Absolute: 0 10*3/uL (ref 0.0–0.1)
Basophils Relative: 1 %
Eosinophils Absolute: 0.1 10*3/uL (ref 0.0–0.5)
Eosinophils Relative: 1 %
HCT: 43.5 % (ref 39.0–52.0)
Hemoglobin: 14.9 g/dL (ref 13.0–17.0)
Immature Granulocytes: 1 %
Lymphocytes Relative: 12 %
Lymphs Abs: 0.9 10*3/uL (ref 0.7–4.0)
MCH: 33 pg (ref 26.0–34.0)
MCHC: 34.3 g/dL (ref 30.0–36.0)
MCV: 96.5 fL (ref 80.0–100.0)
Monocytes Absolute: 0.6 10*3/uL (ref 0.1–1.0)
Monocytes Relative: 9 %
Neutro Abs: 5.3 10*3/uL (ref 1.7–7.7)
Neutrophils Relative %: 76 %
Platelets: 188 10*3/uL (ref 150–400)
RBC: 4.51 MIL/uL (ref 4.22–5.81)
RDW: 11.9 % (ref 11.5–15.5)
WBC: 6.9 10*3/uL (ref 4.0–10.5)
nRBC: 0 % (ref 0.0–0.2)

## 2018-11-26 LAB — AMMONIA: Ammonia: 32 umol/L (ref 9–35)

## 2018-11-26 LAB — VALPROIC ACID LEVEL: Valproic Acid Lvl: 41 ug/mL — ABNORMAL LOW (ref 50.0–100.0)

## 2018-11-26 MED ORDER — AMLODIPINE BESYLATE 10 MG PO TABS
10.0000 mg | ORAL_TABLET | Freq: Every day | ORAL | Status: DC
  Administered 2018-11-26 – 2018-11-27 (×2): 10 mg via ORAL
  Filled 2018-11-26: qty 1

## 2018-11-26 NOTE — Progress Notes (Signed)
PROGRESS NOTE    Ronald [name]  VQQ:[id number] DOB: 04-23-1945 DOA: 11/21/2018 PCP: [name], MD   Brief Narrative:  Patient is a 73 year old male with history of recent right frontal ICH, A. fib not on anticoagulation, hypertension, chronic diastolic CHF, COPD, CKD stage III, diabetes type 2, seizure disorder who presents from home for evaluation of worsening confusion and gait problem.  Neurology has been consulted and following. There was also report of one seizure episode.  CT imaging of the brain did not show any acute intracranial abnormality and  shows evolution of right frontal intraparenchymal hemorrhage. Patient admitted for the evaluation of altered mental status. Underwent MRI without finding of any acute intracranial abnormalities.  PT/OT recommended CIR.  CIR consulted.   Patient is medically stable for discharge to CIR as soon as the bed is available.  Assessment & Plan:   Principal Problem:   Acute encephalopathy Active Problems:   Obstructive sleep apnea   Essential hypertension   Type 2 diabetes mellitus with hyperglycemia (HCC)   Paroxysmal atrial fibrillation (HCC)   Chronic diastolic CHF (congestive heart failure) (HCC)   ICH (intracerebral hemorrhage) (HCC) w/ SAH while on Eliquis   Seizures ([place]), secondsry to ICH   CKD (chronic kidney disease), stage III ([place])   Thoracic ascending aortic aneurysm (HCC)   AMS (altered mental status)   Altered mental status: Suspected to be from [place] versus behavioral/personality changes secondary to frontal lobe hematoma.  Keppra has been discontinued.  Started on Depakote. EEG did not show any epileptiform activities. MRI did not show any acute intracranial abnormalities. His mental status waxes and wanes. He is confused mostly at night. Likely sundowning.Started on Seroquel.  Neurology signed off and recommended to follow-up as an outpatient.  Recent intracerebral hemorrhage: Currently stable.  Brain imaging   finding as above.  Seizures: Reported of an episode of seizure recently.  Was on Keppra at home.  Keppra changed to Depakote.  Continue seizure precaution.  Paroxysmal A. fib: Currently in sinus rhythm.CHADS-VASc 7.  Not on anticoagulation due to history of ICH.  Continue amiodarone.  Rate is well controlled.  Hypertension: Hypertensive this morning.  Continue to monitor blood pressure.  Added amlodipine.  Type 2 diabetes mellitus: Hemoglobin A1c 6.9 in July 2020.  On metformin, Ozempic at home .Continue sliding scale insulin here.  Chronic diastolic CHF: Currently compensated.  Lasix on hold due to AKI.   OSA: Continue CPAP at night.  History of ascending thoracic aortic aneurysm:  Imaging showed  4.1 cm noted incidentally on CTA neck in ED.  Follow-up imaging recommended as an outpatient.  Debility/deconditioning: PT/OT evaluation requested.recommended CIR.        DVT prophylaxis: SCD Code Status: Full Family Communication: Wife at bedside yesterday Disposition Plan: CIR as soon as bed is available  Consultants: Neurology  Procedures: EEG  Antimicrobials:  Anti-infectives (From admission, onward)   None      Subjective:  Patient seen and examined the bedside this morning.  Was on restraints due to agitation at night.  During my evaluation, he was confused and disoriented but calm and cooperative. Denies any complains.  Objective: Vitals:   11/26/18 0434 11/26/18 0746 11/26/18 0903 11/26/18 1127  BP:   (!) 165/103 (!) 185/97  Pulse:   75 80  Resp:   20 15  Temp:   98.5 F (36.9 C) (!) 97.5 F (36.4 C)  TempSrc:   Oral Oral  SpO2:  91% 90% 96%  Weight: 103.7 kg  Height:        Intake/Output Summary (Last 24 hours) at 11/26/2018 1402 Last data filed at 11/26/2018 0251 Gross per 24 hour  Intake 3 ml  Output 300 ml  Net -297 ml   Filed Weights   11/24/18 1639 11/25/18 0455 11/26/18 0434  Weight: 101.5 kg 102.4 kg 103.7 kg    Examination:  General  exam: Comfortable, alert and awake HEENT:PERRL,Oral mucosa moist, Ear/Nose normal on gross exam Respiratory system: Bilateral equal air entry, normal vesicular breath sounds, no wheezes or crackles  Cardiovascular system: S1 & S2 heard, RRR. No JVD, murmurs, rubs, gallops or clicks. No pedal edema. Gastrointestinal system: Abdomen is nondistended, soft and nontender. No organomegaly or masses felt. Normal bowel sounds heard. Central nervous system: Alert, awake but confused and disoriented.Obeys commands  extremities: No edema, no clubbing ,no cyanosis, distal peripheral pulses palpable. Skin: No rashes, lesions or ulcers,no icterus ,no pallor    Data Reviewed: I have personally reviewed following labs and imaging studies  CBC: Recent Labs  Lab 11/21/18 1644 11/22/18 1332 11/26/18 0831  WBC 5.2 4.7 6.9  NEUTROABS 3.4  --  5.3  HGB 14.8 14.2 14.9  HCT 43.1 42.5 43.5  MCV 98.0 98.4 96.5  PLT 207 201 161   Basic Metabolic Panel: Recent Labs  Lab 11/21/18 1644 11/22/18 1332 11/23/18 0448 11/24/18 0810 11/26/18 0831  NA 137 140 137 138 140  K 4.4 4.6 4.2 3.5 3.5  CL 104 106 102 106 105  CO2 22 23 24 24 22   GLUCOSE 135* 134* 174* 126* 129*  BUN 18 16 12 10 17   CREATININE 1.62* 1.24 1.31* 1.17 1.30*  CALCIUM 9.3 9.1 8.6* 8.4* 8.8*   GFR: Estimated Creatinine Clearance: 62.1 mL/min (A) (by C-G formula based on SCr of 1.3 mg/dL (H)). Liver Function Tests: Recent Labs  Lab 11/21/18 1644  AST 18  ALT 21  ALKPHOS 82  BILITOT 0.8  PROT 6.5  ALBUMIN 3.8   No results for input(s): LIPASE, AMYLASE in the last 168 hours. Recent Labs  Lab 11/26/18 1105  AMMONIA 32   Coagulation Profile: Recent Labs  Lab 11/21/18 1644  INR 1.0   Cardiac Enzymes: No results for input(s): CKTOTAL, CKMB, CKMBINDEX, TROPONINI in the last 168 hours. BNP (last 3 results) No results for input(s): PROBNP in the last 8760 hours. HbA1C: No results for input(s): HGBA1C in the last 72 hours.  CBG: Recent Labs  Lab 11/25/18 1143 11/25/18 1559 11/25/18 2126 11/26/18 0614 11/26/18 1138  GLUCAP 116* 179* 115* 112* 135*   Lipid Profile: No results for input(s): CHOL, HDL, LDLCALC, TRIG, CHOLHDL, LDLDIRECT in the last 72 hours. Thyroid Function Tests: No results for input(s): TSH, T4TOTAL, FREET4, T3FREE, THYROIDAB in the last 72 hours. Anemia Panel: No results for input(s): VITAMINB12, FOLATE, FERRITIN, TIBC, IRON, RETICCTPCT in the last 72 hours. Sepsis Labs: No results for input(s): PROCALCITON, LATICACIDVEN in the last 168 hours.  Recent Results (from the past 240 hour(s))  SARS CORONAVIRUS 2 Nasal Swab Aptima Multi Swab     Status: None   Collection Time: 11/22/18  4:58 AM   Specimen: Aptima Multi Swab; Nasal Swab  Result Value Ref Range Status   SARS Coronavirus 2 NEGATIVE NEGATIVE Final    Comment: (NOTE) SARS-CoV-2 target nucleic acids are NOT DETECTED. The SARS-CoV-2 RNA is generally detectable in upper and lower respiratory specimens during the acute phase of infection. Negative results do not preclude SARS-CoV-2 infection, do not rule out co-infections with other  pathogens, and should not be used as the sole basis for treatment or other patient management decisions. Negative results must be combined with clinical observations, patient history, and epidemiological information. The expected result is Negative. Fact Sheet for Patients: SugarRoll.be Fact Sheet for Healthcare Providers: https://www.woods-mathews.com/ This test is not yet approved or cleared by the [place] FDA and  has been authorized for detection and/or diagnosis of SARS-CoV-2 by FDA under an Emergency Use Authorization (EUA). This EUA will remain  in effect (meaning this test can be used) for the duration of the COVID-19 declaration under Section 56 4(b)(1) of the Act, 21 U.S.C. section 360bbb-3(b)(1), unless the authorization is terminated or  revoked sooner. Performed at [facility], [place] [address], [place],  [address]          Radiology Studies: No results found.      Scheduled Meds: . amiodarone  200 mg Oral Daily  . amLODipine  10 mg Oral Daily  . atorvastatin  10 mg Oral QHS  . carvedilol  12.5 mg Oral BID WC  . divalproex  500 mg Oral Q12H  . insulin aspart  0-5 Units Subcutaneous QHS  . insulin aspart  0-9 Units Subcutaneous TID WC  . losartan  25 mg Oral Daily  . mometasone-formoterol  2 puff Inhalation BID  . QUEtiapine  25 mg Oral QHS  . sodium chloride flush  3 mL Intravenous Once  . sodium chloride flush  3 mL Intravenous Q12H   Continuous Infusions:    LOS: 3 days    Time spent: 35 mins.More than 50% of that time was spent in counseling and/or coordination of care.      Shelly Coss, MD [facility] Pager [phone]  If 7PM-7AM, please contact night-coverage [email or web address] Password [email or web address] 11/26/2018, 2:02 PM

## 2018-11-26 NOTE — Progress Notes (Addendum)
NEUROLOGY PROGRESS NOTE  Subjective: Patient currently still has sitter.  Wife is in room stating that he gets confused mostly at night.  Patient is calm at this point in time.  Patient does have two-point restraints.  Patient has no complaints.  Exam: Vitals:   11/26/18 0746 11/26/18 0903  BP:  (!) 165/103  Pulse:  75  Resp:  20  Temp:  98.5 F (36.9 C)  SpO2: 91% 90%    Physical Exam   HEENT-  Normocephalic, no lesions, without obvious abnormality.  Normal external eye and conjunctiva.   Extremities- Warm, dry and intact Musculoskeletal-no joint tenderness, deformity or swelling Skin-warm and dry, no hyperpigmentation, vitiligo, or suspicious lesions    Neuro:  Mental Status: Patient is alert, he is not oriented to place, he is oriented to his wife- month-year.  When asked why 1 would not throw a stone at a glass house he gave the correct answer.  Could not spell the word "world" backwards.  Named 6 animals in 1 minute.  Was able to tell me how many quarters are in $2.  Had difficulty with counting back with serial sevens.  Was able to recall 2 words out of 3.  Was able to follow simple commands. Cranial Nerves: II:  Visual fields grossly normal,  III,IV, VI: ptosis not present, extra-ocular motions intact bilaterally pupils equal, round, reactive to light and accommodation V,VII: smile symmetric, facial light touch sensation normal bilaterally VIII: hearing normal bilaterally IX,X: Palate rises midline XI: bilateral shoulder shrug XII: midline tongue extension Motor: Right : Upper extremity   5/5    Left:     Upper extremity   5/5  Lower extremity   5/5     Lower extremity   5/5 Tone and bulk:normal tone throughout; no atrophy noted Sensory: Pinprick and light touch intact throughout, bilaterally      Medications:  Scheduled: . amiodarone  200 mg Oral Daily  . atorvastatin  10 mg Oral QHS  . carvedilol  12.5 mg Oral BID WC  . divalproex  500 mg Oral Q12H  .  insulin aspart  0-5 Units Subcutaneous QHS  . insulin aspart  0-9 Units Subcutaneous TID WC  . losartan  25 mg Oral Daily  . mometasone-formoterol  2 puff Inhalation BID  . QUEtiapine  12.5 mg Oral Once  . QUEtiapine  25 mg Oral QHS  . sodium chloride flush  3 mL Intravenous Once  . sodium chloride flush  3 mL Intravenous Q12H   Continuous:   Pertinent Labs/Diagnostics: MRI brain: Contracting subacute right frontal hematoma without vasogenic edema or mass-effect  Ct Head Wo Contrast  Result Date: 11/22/2018 CLINICAL DATA:  Intracranial hemorrhage, known. Follow-up. EXAM: CT HEAD WITHOUT CONTRAST TECHNIQUE:IMPRESSION: 1. Continued expected evolution of the previously demonstrated intraparenchymal hemorrhage within the RIGHT frontal lobe, again less conspicuous than on earlier studies. Associated parenchymal edema within the RIGHT frontal lobe is stable in extent. No associated mass effect or midline shift. 2. No new intracranial abnormality. No new or acute intracranial hemorrhage. Electronically Signed   By: Franki Cabot M.D.   On: 11/22/2018 19:54   Mr Jeri Cos JE Contrast  Result Date: 11/23/2018 CLINICAL DATA:  Encephalopathy.IMPRESSION: 1. Contracting subacute right frontal hematoma without vasogenic edema or mass effect. 2. No acute finding or new abnormality. Electronically Signed   By: Monte Fantasia M.D.   On: 11/23/2018 13:09   No results found.   Etta Quill PA-C [facility] [phone]   Assessment:  73 year old male with a recent right frontal lobe ICH, who presented with 2-week history of progressive mental status decline which acutely worsened over the past 2 weeks in conjunction with new onset of gait instability.  As noted prior EEG was normal and MRI did not show any worsening of the right frontal hematoma.  The Keppra was changed to Depakote.  Wife states at this point that his agitation is more at night than during the day.  I do believe this is  more of a delirium at this point time.  Patient is calm and collective at this time.  His mentation is slightly confused but able to follow all commands and take part in multiple questions answering correctly.  Impression Metabolic Encephalopathy due to Delirium Left Frontal lobe hemorrhage Seizure   Recommendations: -Continue Depakote at 500 mg twice daily -Continue good hydration -Continue Seroquel at bedtime -Will need outpatient follow-up - It appears patient is going to inpatient rehab - No driving for 6 months.   11/26/2018, 10:42 AM    NEUROHOSPITALIST ADDENDUM Performed a face to face diagnostic evaluation.   I have reviewed the contents of history and physical exam as documented by PA/ARNP/Resident and agree with above documentation.  I have discussed and formulated the above plan as documented. Edits to the note have been made as needed.  Patient with recent right frontal hemorrhage presenting with seizures.  Keppra was switched to Depakote due to worsening altered mental status and Seroquel was started.  Depakote level today was 41 and ammonia was within normal limits.  Patient was agitated overnight and has been intermittently confused.  He is better this morning but wife who is at bedside states that he has been intermittently confused.  On assessment, patient is more confused compared to yesterday.  He is not engaging me as much while questioning and does not maintain eye contact.  He is oriented to the hospital and month and answers most questions appropriately.   I suspect altered mental status that worsens during nighttime likely delirium in the setting of left frontal hemorrhage.  Continue Seroquel, sleep hygiene.       Karena Addison Aroor MD [facility] [phone]   If 7pm to 7am, please call on call as listed on AMION.

## 2018-11-26 NOTE — Progress Notes (Signed)
TOC following patient for d/c needs. Plan is for CIR once he is out of restraints and not requiring IV Ativan for agitation. CIR to reassess in the am.

## 2018-11-26 NOTE — Progress Notes (Signed)
Inpatient Rehabilitation-Admissions Coordinator   Unfortunately there is not a bed available for this patient today. AC will continue to follow for pt progress and bed availability.   Jhonnie Garner, OTR/L  Rehab Admissions Coordinator  [phone] 11/26/2018 1:43 PM

## 2018-11-26 NOTE — Progress Notes (Signed)
Patient's spouse is approved to sit with patient whenever she is able.  This is due to patient's confusion and in an attempt to keep him from needing physical or chemical restraint. Ronald [name]

## 2018-11-26 NOTE — Progress Notes (Signed)
Physical Therapy Treatment Patient Details Name: Ronald [name] MRN: [id number] DOB: [birth year] Today's Date: 11/26/2018    History of Present Illness Pt is a 73 y/o male who presents with progressing confusion and unsteady gait. Pt with recent admission for R frontal ICH with no new bleed seen on imaging. PMH significant for seizure, HTN, diastolic heart failure, DMII, COPD, CKD III.     PT Comments    Pt progressing towards goals, however, Pt demonstrates cognitive decline since last session. Pt had apparent hallucinations during therapy session. Pt became agitated with safety cues, however, was easily redirected. Pt continues to show slowed processing and difficulty sequencing tasks. Despite this, he did demonstrate improved initiative to complete mobility tasks. Tasks performed at the min to mod A +2 levels. Continue to recommend CIR level therapies to maximize functional independence and decrease safety risk upon return home with wife.   Follow Up Recommendations  Supervision/Assistance - 24 hour;CIR     Equipment Recommendations  None recommended by PT    Recommendations for Other Services       Precautions / Restrictions Precautions Precautions: Fall Restrictions Weight Bearing Restrictions: No    Mobility  Bed Mobility Overal bed mobility: Needs Assistance Bed Mobility: Supine to Sit;Sit to Supine     Supine to sit: Min assist Sit to supine: Min assist   General bed mobility comments: Pt requires min A for supine to sit and sit to supine. VCs and tactile cues required for foot placement off EOB. Physical assist required for trunk elevation.  Transfers Overall transfer level: Needs assistance Equipment used: Rolling walker (2 wheeled) Transfers: Sit to/from Stand Sit to Stand: Mod assist;+2 physical assistance;+2 safety/equipment         General transfer comment: Pt requires mod A +2 for physical assistance and safety. Requires lift assistance for sit to stand  transfer with RW. Pt required VCs for hand placement, however, continued to pull up on RW.  Ambulation/Gait Ambulation/Gait assistance: Min assist;+2 safety/equipment Gait Distance (Feet): 100 Feet Assistive device: Rolling walker (2 wheeled) Gait Pattern/deviations: Step-through pattern;Decreased step length - right;Decreased step length - left;Staggering right;Drifts right/left Gait velocity: decreased   General Gait Details: Pt ambulates with a step through pattern with RW & +2 assist. Pt requires VCs to remain within RW and to increase awareness of R side deficits.  Pt running into objects on R side during ambulation. Pt easily distracted by catheter and required multiple cues for redirection to task.   Stairs             Wheelchair Mobility    Modified Rankin (Stroke Patients Only) Modified Rankin (Stroke Patients Only) Pre-Morbid Rankin Score: No symptoms Modified Rankin: Moderately severe disability     Balance Overall balance assessment: Needs assistance Sitting-balance support: Feet supported;No upper extremity supported Sitting balance-Leahy Scale: Fair   Postural control: Posterior lean Standing balance support: Bilateral upper extremity supported Standing balance-Leahy Scale: Poor Standing balance comment: Pt requires external support including UE support.                            Cognition Arousal/Alertness: Awake/alert Behavior During Therapy: Agitated;Impulsive Overall Cognitive Status: Impaired/Different from baseline Area of Impairment: Orientation;Attention;Memory;Following commands;Safety/judgement;Awareness;Problem solving                 Orientation Level: Disoriented to;Place;Situation Current Attention Level: Focused Memory: Decreased short-term memory;Decreased recall of precautions Following Commands: Follows one step commands inconsistently;Follows one step commands with  increased time Safety/Judgement: Decreased  awareness of safety;Decreased awareness of deficits Awareness: Intellectual Problem Solving: Slow processing;Requires tactile cues;Requires verbal cues;Difficulty sequencing;Decreased initiation General Comments: Pt demonstrates decline in cognition with apparent hallucination while therapy present. Pt requires multiple cues to complete tasks and is slow to respond and process. Pt unable to state current location or situation. Pt became agitated with safety cues during ambulation, but was easily redirected.      Exercises      General Comments        Pertinent Vitals/Pain Pain Assessment: No/denies pain    Home Living                      Prior Function            PT Goals (current goals can now be found in the care plan section) Acute Rehab PT Goals Patient Stated Goal: wanted to brush teeth PT Goal Formulation: With patient Time For Goal Achievement: 12/08/18 Potential to Achieve Goals: Good Progress towards PT goals: Progressing toward goals(Pt demonstrates increased initative, but cognitive decline.)    Frequency    Min 4X/week      PT Plan Current plan remains appropriate    Co-evaluation              AM-PAC PT "6 Clicks" Mobility   Outcome Measure  Help needed turning from your back to your side while in a flat bed without using bedrails?: A Little Help needed moving from lying on your back to sitting on the side of a flat bed without using bedrails?: A Little Help needed moving to and from a bed to a chair (including a wheelchair)?: A Lot Help needed standing up from a chair using your arms (e.g., wheelchair or bedside chair)?: A Lot Help needed to walk in hospital room?: A Little Help needed climbing 3-5 steps with a railing? : A Lot 6 Click Score: 15    End of Session Equipment Utilized During Treatment: Gait belt Activity Tolerance: Treatment limited secondary to agitation(Pt demonstrates confusion, agitation, and  hallucinations.) Patient left: in bed;with call bell/phone within reach;with bed alarm set Nurse Communication: Mobility status PT Visit Diagnosis: Unsteadiness on feet (R26.81);Other abnormalities of gait and mobility (R26.89);Muscle weakness (generalized) (M62.81)     Time: 2244-9753 PT Time Calculation (min) (ACUTE ONLY): 29 min  Charges:  $Gait Training: 8-22 mins $Therapeutic Activity: 8-22 mins                     Ronald [name], SPT   Matthan Sledge 11/26/2018, 6:10 PM

## 2018-11-27 ENCOUNTER — Other Ambulatory Visit: Payer: Self-pay

## 2018-11-27 ENCOUNTER — Encounter (HOSPITAL_COMMUNITY): Payer: Self-pay

## 2018-11-27 ENCOUNTER — Inpatient Hospital Stay (HOSPITAL_COMMUNITY)
Admission: RE | Admit: 2018-11-27 | Discharge: 2018-12-13 | DRG: 057 | Disposition: A | Discharge status: Home Health | Payer: Medicare Other | Source: Intra-hospital | Attending: Physical Medicine & Rehabilitation | Admitting: Physical Medicine & Rehabilitation

## 2018-11-27 ENCOUNTER — Inpatient Hospital Stay: Payer: Federal, State, Local not specified - PPO | Admitting: Adult Health

## 2018-11-27 DIAGNOSIS — J439 Emphysema, unspecified: Secondary | ICD-10-CM | POA: Diagnosis present

## 2018-11-27 DIAGNOSIS — Z8546 Personal history of malignant neoplasm of prostate: Secondary | ICD-10-CM

## 2018-11-27 DIAGNOSIS — M7989 Other specified soft tissue disorders: Secondary | ICD-10-CM | POA: Diagnosis not present

## 2018-11-27 DIAGNOSIS — I5032 Chronic diastolic (congestive) heart failure: Secondary | ICD-10-CM

## 2018-11-27 DIAGNOSIS — Z8041 Family history of malignant neoplasm of ovary: Secondary | ICD-10-CM

## 2018-11-27 DIAGNOSIS — Z7982 Long term (current) use of aspirin: Secondary | ICD-10-CM

## 2018-11-27 DIAGNOSIS — E1122 Type 2 diabetes mellitus with diabetic chronic kidney disease: Secondary | ICD-10-CM | POA: Diagnosis present

## 2018-11-27 DIAGNOSIS — R52 Pain, unspecified: Secondary | ICD-10-CM

## 2018-11-27 DIAGNOSIS — Z9842 Cataract extraction status, left eye: Secondary | ICD-10-CM

## 2018-11-27 DIAGNOSIS — Z7901 Long term (current) use of anticoagulants: Secondary | ICD-10-CM

## 2018-11-27 DIAGNOSIS — G3189 Other specified degenerative diseases of nervous system: Secondary | ICD-10-CM | POA: Diagnosis not present

## 2018-11-27 DIAGNOSIS — F09 Unspecified mental disorder due to known physiological condition: Secondary | ICD-10-CM | POA: Diagnosis not present

## 2018-11-27 DIAGNOSIS — E1169 Type 2 diabetes mellitus with other specified complication: Secondary | ICD-10-CM | POA: Diagnosis not present

## 2018-11-27 DIAGNOSIS — I4819 Other persistent atrial fibrillation: Secondary | ICD-10-CM | POA: Diagnosis present

## 2018-11-27 DIAGNOSIS — Z79899 Other long term (current) drug therapy: Secondary | ICD-10-CM | POA: Diagnosis not present

## 2018-11-27 DIAGNOSIS — Y92239 Unspecified place in hospital as the place of occurrence of the external cause: Secondary | ICD-10-CM | POA: Diagnosis not present

## 2018-11-27 DIAGNOSIS — Z974 Presence of external hearing-aid: Secondary | ICD-10-CM

## 2018-11-27 DIAGNOSIS — I130 Hypertensive heart and chronic kidney disease with heart failure and stage 1 through stage 4 chronic kidney disease, or unspecified chronic kidney disease: Secondary | ICD-10-CM | POA: Diagnosis present

## 2018-11-27 DIAGNOSIS — Z8679 Personal history of other diseases of the circulatory system: Secondary | ICD-10-CM | POA: Diagnosis not present

## 2018-11-27 DIAGNOSIS — I69098 Other sequelae following nontraumatic subarachnoid hemorrhage: Secondary | ICD-10-CM | POA: Diagnosis not present

## 2018-11-27 DIAGNOSIS — I610 Nontraumatic intracerebral hemorrhage in hemisphere, subcortical: Secondary | ICD-10-CM

## 2018-11-27 DIAGNOSIS — M109 Gout, unspecified: Secondary | ICD-10-CM | POA: Diagnosis present

## 2018-11-27 DIAGNOSIS — B964 Proteus (mirabilis) (morganii) as the cause of diseases classified elsewhere: Secondary | ICD-10-CM | POA: Diagnosis present

## 2018-11-27 DIAGNOSIS — R32 Unspecified urinary incontinence: Secondary | ICD-10-CM | POA: Diagnosis present

## 2018-11-27 DIAGNOSIS — E1165 Type 2 diabetes mellitus with hyperglycemia: Secondary | ICD-10-CM | POA: Diagnosis present

## 2018-11-27 DIAGNOSIS — Z803 Family history of malignant neoplasm of breast: Secondary | ICD-10-CM | POA: Diagnosis not present

## 2018-11-27 DIAGNOSIS — I5042 Chronic combined systolic (congestive) and diastolic (congestive) heart failure: Secondary | ICD-10-CM | POA: Diagnosis present

## 2018-11-27 DIAGNOSIS — I619 Nontraumatic intracerebral hemorrhage, unspecified: Secondary | ICD-10-CM | POA: Diagnosis not present

## 2018-11-27 DIAGNOSIS — E785 Hyperlipidemia, unspecified: Secondary | ICD-10-CM | POA: Diagnosis present

## 2018-11-27 DIAGNOSIS — I480 Paroxysmal atrial fibrillation: Secondary | ICD-10-CM | POA: Diagnosis present

## 2018-11-27 DIAGNOSIS — N390 Urinary tract infection, site not specified: Secondary | ICD-10-CM | POA: Diagnosis present

## 2018-11-27 DIAGNOSIS — Z86010 Personal history of colonic polyps: Secondary | ICD-10-CM | POA: Diagnosis not present

## 2018-11-27 DIAGNOSIS — N183 Chronic kidney disease, stage 3 (moderate): Secondary | ICD-10-CM | POA: Diagnosis present

## 2018-11-27 DIAGNOSIS — Z9841 Cataract extraction status, right eye: Secondary | ICD-10-CM | POA: Diagnosis not present

## 2018-11-27 DIAGNOSIS — Z823 Family history of stroke: Secondary | ICD-10-CM

## 2018-11-27 DIAGNOSIS — I712 Thoracic aortic aneurysm, without rupture: Secondary | ICD-10-CM | POA: Diagnosis present

## 2018-11-27 DIAGNOSIS — G40909 Epilepsy, unspecified, not intractable, without status epilepticus: Secondary | ICD-10-CM | POA: Diagnosis not present

## 2018-11-27 DIAGNOSIS — G4733 Obstructive sleep apnea (adult) (pediatric): Secondary | ICD-10-CM | POA: Diagnosis present

## 2018-11-27 DIAGNOSIS — R569 Unspecified convulsions: Secondary | ICD-10-CM

## 2018-11-27 DIAGNOSIS — T380X5A Adverse effect of glucocorticoids and synthetic analogues, initial encounter: Secondary | ICD-10-CM | POA: Diagnosis not present

## 2018-11-27 DIAGNOSIS — S069X0S Unspecified intracranial injury without loss of consciousness, sequela: Secondary | ICD-10-CM | POA: Diagnosis not present

## 2018-11-27 DIAGNOSIS — I428 Other cardiomyopathies: Secondary | ICD-10-CM | POA: Diagnosis present

## 2018-11-27 DIAGNOSIS — G9340 Encephalopathy, unspecified: Secondary | ICD-10-CM | POA: Diagnosis present

## 2018-11-27 DIAGNOSIS — Z7951 Long term (current) use of inhaled steroids: Secondary | ICD-10-CM

## 2018-11-27 DIAGNOSIS — A499 Bacterial infection, unspecified: Secondary | ICD-10-CM | POA: Diagnosis not present

## 2018-11-27 DIAGNOSIS — F17210 Nicotine dependence, cigarettes, uncomplicated: Secondary | ICD-10-CM | POA: Diagnosis present

## 2018-11-27 DIAGNOSIS — G92 Toxic encephalopathy: Secondary | ICD-10-CM

## 2018-11-27 DIAGNOSIS — M79672 Pain in left foot: Secondary | ICD-10-CM | POA: Diagnosis not present

## 2018-11-27 DIAGNOSIS — I10 Essential (primary) hypertension: Secondary | ICD-10-CM | POA: Diagnosis present

## 2018-11-27 DIAGNOSIS — M1009 Idiopathic gout, multiple sites: Secondary | ICD-10-CM | POA: Diagnosis not present

## 2018-11-27 DIAGNOSIS — N400 Enlarged prostate without lower urinary tract symptoms: Secondary | ICD-10-CM | POA: Diagnosis present

## 2018-11-27 DIAGNOSIS — Z5320 Procedure and treatment not carried out because of patient's decision for unspecified reasons: Secondary | ICD-10-CM | POA: Diagnosis not present

## 2018-11-27 DIAGNOSIS — M79671 Pain in right foot: Secondary | ICD-10-CM | POA: Diagnosis not present

## 2018-11-27 DIAGNOSIS — Z7984 Long term (current) use of oral hypoglycemic drugs: Secondary | ICD-10-CM

## 2018-11-27 DIAGNOSIS — E669 Obesity, unspecified: Secondary | ICD-10-CM | POA: Diagnosis not present

## 2018-11-27 LAB — GLUCOSE, CAPILLARY
Glucose-Capillary: 123 mg/dL — ABNORMAL HIGH (ref 70–99)
Glucose-Capillary: 114 mg/dL — ABNORMAL HIGH (ref 70–99)
Glucose-Capillary: 138 mg/dL — ABNORMAL HIGH (ref 70–99)

## 2018-11-27 LAB — BASIC METABOLIC PANEL
Anion gap: 10 (ref 5–15)
BUN: 19 mg/dL (ref 8–23)
CO2: 23 mmol/L (ref 22–32)
Calcium: 8.4 mg/dL — ABNORMAL LOW (ref 8.9–10.3)
Chloride: 104 mmol/L (ref 98–111)
Creatinine, Ser: 1.32 mg/dL — ABNORMAL HIGH (ref 0.61–1.24)
GFR calc Af Amer: >60 mL/min (ref >60)
GFR calc non Af Amer: 53 mL/min — ABNORMAL LOW (ref >60)
Glucose, Bld: 119 mg/dL — ABNORMAL HIGH (ref 70–99)
Potassium: 3.1 mmol/L — ABNORMAL LOW (ref 3.5–5.1)
Sodium: 137 mmol/L (ref 135–145)

## 2018-11-27 MED ORDER — CARVEDILOL 12.5 MG PO TABS
12.5000 mg | ORAL_TABLET | Freq: Two times a day (BID) | ORAL | Status: DC
  Administered 2018-11-28 – 2018-12-13 (×31): 12.5 mg via ORAL
  Filled 2018-11-27 (×31): qty 1

## 2018-11-27 MED ORDER — ATORVASTATIN CALCIUM 10 MG PO TABS
10.0000 mg | ORAL_TABLET | Freq: Every day | ORAL | Status: DC
  Administered 2018-11-27 – 2018-12-12 (×16): 10 mg via ORAL
  Filled 2018-11-27 (×16): qty 1

## 2018-11-27 MED ORDER — POTASSIUM CHLORIDE CRYS ER 20 MEQ PO TBCR
40.0000 meq | EXTENDED_RELEASE_TABLET | ORAL | Status: AC
  Administered 2018-11-27: 40 meq via ORAL
  Filled 2018-11-27: qty 2

## 2018-11-27 MED ORDER — LOSARTAN POTASSIUM 50 MG PO TABS
25.0000 mg | ORAL_TABLET | Freq: Every day | ORAL | Status: DC
  Administered 2018-11-28 – 2018-12-13 (×16): 25 mg via ORAL
  Filled 2018-11-27 (×16): qty 1

## 2018-11-27 MED ORDER — MOMETASONE FURO-FORMOTEROL FUM 200-5 MCG/ACT IN AERO
2.0000 | INHALATION_SPRAY | Freq: Two times a day (BID) | RESPIRATORY_TRACT | Status: DC
  Administered 2018-11-27 – 2018-12-13 (×31): 2 via RESPIRATORY_TRACT
  Filled 2018-11-27: qty 8.8

## 2018-11-27 MED ORDER — HYDROCODONE-ACETAMINOPHEN 5-325 MG PO TABS
1.0000 | ORAL_TABLET | Freq: Four times a day (QID) | ORAL | Status: DC | PRN
  Administered 2018-11-30 – 2018-12-09 (×7): 1 via ORAL
  Filled 2018-11-27 (×8): qty 1

## 2018-11-27 MED ORDER — ONDANSETRON HCL 4 MG PO TABS: 4.0000 mg | ORAL_TABLET | Freq: Four times a day (QID) | ORAL | Status: DC | PRN

## 2018-11-27 MED ORDER — ACETAMINOPHEN 650 MG RE SUPP: 650.0000 mg | Freq: Four times a day (QID) | RECTAL | Status: DC | PRN

## 2018-11-27 MED ORDER — AMLODIPINE BESYLATE 10 MG PO TABS
10.0000 mg | ORAL_TABLET | Freq: Every day | ORAL | Status: DC
  Administered 2018-11-28 – 2018-12-13 (×16): 10 mg via ORAL
  Filled 2018-11-27 (×16): qty 1

## 2018-11-27 MED ORDER — SORBITOL 70 % SOLN
30.0000 mL | Freq: Every day | Status: DC | PRN
  Administered 2018-12-11: 30 mL via ORAL
  Filled 2018-11-27 (×2): qty 30

## 2018-11-27 MED ORDER — QUETIAPINE FUMARATE 25 MG PO TABS: 25.0000 mg | ORAL_TABLET | Freq: Every day | ORAL | Status: DC

## 2018-11-27 MED ORDER — AMIODARONE HCL 200 MG PO TABS
200.0000 mg | ORAL_TABLET | Freq: Every day | ORAL | Status: DC
  Administered 2018-11-28 – 2018-12-13 (×16): 200 mg via ORAL
  Filled 2018-11-27 (×16): qty 1

## 2018-11-27 MED ORDER — ACETAMINOPHEN 325 MG PO TABS
650.0000 mg | ORAL_TABLET | Freq: Four times a day (QID) | ORAL | Status: DC | PRN
  Administered 2018-11-28 – 2018-12-02 (×7): 650 mg via ORAL
  Filled 2018-11-27 (×7): qty 2

## 2018-11-27 MED ORDER — INSULIN ASPART 100 UNIT/ML ~~LOC~~ SOLN
0.0000 [IU] | Freq: Three times a day (TID) | SUBCUTANEOUS | Status: DC
  Administered 2018-11-28 (×2): 1 [IU] via SUBCUTANEOUS
  Administered 2018-11-28: 19:00:00 2 [IU] via SUBCUTANEOUS
  Administered 2018-11-29 – 2018-11-30 (×4): 1 [IU] via SUBCUTANEOUS
  Administered 2018-11-30: 3 [IU] via SUBCUTANEOUS
  Administered 2018-11-30: 1 [IU] via SUBCUTANEOUS
  Administered 2018-12-01: 2 [IU] via SUBCUTANEOUS
  Administered 2018-12-01: 1 [IU] via SUBCUTANEOUS
  Administered 2018-12-01: 2 [IU] via SUBCUTANEOUS
  Administered 2018-12-02: 1 [IU] via SUBCUTANEOUS
  Administered 2018-12-02 – 2018-12-03 (×4): 2 [IU] via SUBCUTANEOUS
  Administered 2018-12-03: 3 [IU] via SUBCUTANEOUS
  Administered 2018-12-04: 2 [IU] via SUBCUTANEOUS
  Administered 2018-12-04: 3 [IU] via SUBCUTANEOUS
  Administered 2018-12-04: 2 [IU] via SUBCUTANEOUS
  Administered 2018-12-05: 3 [IU] via SUBCUTANEOUS
  Administered 2018-12-05: 2 [IU] via SUBCUTANEOUS
  Administered 2018-12-05: 5 [IU] via SUBCUTANEOUS
  Administered 2018-12-06 (×3): 3 [IU] via SUBCUTANEOUS
  Administered 2018-12-07: 1 [IU] via SUBCUTANEOUS
  Administered 2018-12-07: 2 [IU] via SUBCUTANEOUS
  Administered 2018-12-07: 5 [IU] via SUBCUTANEOUS
  Administered 2018-12-08: 1 [IU] via SUBCUTANEOUS
  Administered 2018-12-08 – 2018-12-09 (×3): 3 [IU] via SUBCUTANEOUS
  Administered 2018-12-09: 09:00:00 2 [IU] via SUBCUTANEOUS
  Administered 2018-12-09 – 2018-12-10 (×3): 3 [IU] via SUBCUTANEOUS
  Administered 2018-12-10 (×2): 5 [IU] via SUBCUTANEOUS
  Administered 2018-12-11 (×3): 7 [IU] via SUBCUTANEOUS
  Administered 2018-12-11: 13:00:00 5 [IU] via SUBCUTANEOUS
  Administered 2018-12-12: 1 [IU] via SUBCUTANEOUS
  Administered 2018-12-12 – 2018-12-13 (×3): 3 [IU] via SUBCUTANEOUS

## 2018-11-27 MED ORDER — DIVALPROEX SODIUM 500 MG PO DR TAB
500.0000 mg | DELAYED_RELEASE_TABLET | Freq: Two times a day (BID) | ORAL | Status: DC
  Administered 2018-11-27 – 2018-12-13 (×32): 500 mg via ORAL
  Filled 2018-11-27 (×33): qty 1

## 2018-11-27 MED ORDER — AMLODIPINE BESYLATE 10 MG PO TABS: 10.0000 mg | ORAL_TABLET | Freq: Every day | ORAL | Status: DC

## 2018-11-27 MED ORDER — DIVALPROEX SODIUM 500 MG PO DR TAB: 500.0000 mg | DELAYED_RELEASE_TABLET | Freq: Two times a day (BID) | ORAL | Status: DC

## 2018-11-27 MED ORDER — ONDANSETRON HCL 4 MG/2ML IJ SOLN: 4.0000 mg | Freq: Four times a day (QID) | INTRAMUSCULAR | Status: DC | PRN

## 2018-11-27 MED ORDER — POLYETHYLENE GLYCOL 3350 17 G PO PACK
17.0000 g | PACK | Freq: Every day | ORAL | Status: DC | PRN
  Filled 2018-11-27: qty 1

## 2018-11-27 MED ORDER — ALBUTEROL SULFATE (2.5 MG/3ML) 0.083% IN NEBU: 3.0000 mL | INHALATION_SOLUTION | Freq: Four times a day (QID) | RESPIRATORY_TRACT | Status: DC | PRN

## 2018-11-27 MED ORDER — QUETIAPINE FUMARATE 25 MG PO TABS
25.0000 mg | ORAL_TABLET | Freq: Every day | ORAL | Status: DC
  Administered 2018-11-27: 25 mg via ORAL
  Filled 2018-11-27: qty 1

## 2018-11-27 NOTE — Progress Notes (Signed)
Meredith Staggers, MD  Physician  Physical Medicine and Rehabilitation  PMR Pre-admission  Signed  Date of Service:  11/24/2018 4:53 PM      Related encounter: ED to Hosp-Admission (Current) from 11/21/2018 in [facility]      Signed         PMR Admission Coordinator Pre-Admission Assessment  Patient: Ronald [name] is an 73 y.o., male MRN: [id number] DOB: [birth year] Height: _0  (180.3 cm) Weight: 103.7 kg  Insurance Information HMO:     PPO:      PCP:      IPA:      80/20:  yes    OTHER:  PRIMARY: Medicare Part A and B      Policy#: [id number]      Subscriber: Patient CM Name:       Phone#:      Fax#:  Pre-Cert#:       Employer:  Benefits:  Phone #: NA     Name: verified eligibility online via [place] on 11/24/2018 Eff. Date: Part A effective 05/10/2010, Part B effective 07/08/2012     Deduct: $1,408      Out of Pocket Max: NA      Life Max: NA CIR: Covered per Medicare Guidelines once yearly deductible is met.       SNF: days 1-20, 100%, days 21-100, 80% Outpatient: 80%     Co-Pay: 20% Home Health: 100%      Co-Pay:  DME: 80%     Co-Pay: 20% Providers: Pt's choice  SECONDARY: BCBS      Policy#: [id number]      Subscriber: [name] CM Name:       Phone#:      Fax#:  Pre-Cert#:       Employer:  Benefits:  Phone #: [phone]     Name:  Eff. Date:      Deduct:       Out of Pocket Max:       Life Max:  CIR:       SNF:  Outpatient:      Co-Pay:  Home Health:       Co-Pay:  DME:     Co-Pay:   **AC called [place] at [phone] and spoke with Lattie Haw on 8/20 to confirm Medicare is Primary and BCBS is Secondary. Ref # for call: [id number]  Medicaid Application Date:       Case Manager:  Disability Application Date:       Case Worker:   The Data Collection Information Summary for patients in Inpatient Rehabilitation Facilities with attached Privacy Act [place] Records was provided and verbally reviewed with:  Family  Emergency Contact Information         Contact Information    Name Relation Home Work Mobile   [place] L Spouse [phone]        Current Medical History  Patient Admitting Diagnosis: Decreased functional ability with altered mental statussecondary to recent right frontal ICH with seizure  History of Present Illness: Ronald [name] a 73 year old male with history significant for right frontal ICH related to Eliquis and admitted 10/21/2018 until 10/27/2018 and discharged to home ambulating 240 feet without assistive device, atrial fibrillationfollowed by Dr. Dorris Carnes andno longer on anticoagulation, hypertension, chronic diastolic congestive heart failure,history of ascending thoracic aortic aneurysm followed as an outpatient.,COPDmaintained on CPAP and followed by Dr. Halford Chessman, chronic kidney disease stage IIIwith creatinine 1.62, type 2 diabetes mellitus and seizuremaintained on Keppra 500  mg twice daily. Pt presented 11/22/2018 with increased altered mental status and poor balance leaning to the right side. Urinalysis negative, COVID negative. Cranial CT scan showed no acute or new intracranial hemorrhage. Decreasing conspicuityof the right frontal hemorrhage and slight decrease in associated edema. CT angiogram of head and neck with no emergent large vessel occlusion. MRI of the brain completed 11/23/2018 again showing no acute findings or new abnormality. Prior subacute hematoma measuring 4.2 x 2 cm. No significant adjacent edema or mass-effect. EEG negative for seizure. Most recent echocardiogram after initial [place] 10/22/2018 with ejection fraction of 60% hyperdynamic systolic function.Neurology follow-up patient's Keppra was changed to Depakote 500 mg twice daily. Still with bouts of confusion and restlessness and maintained on Seroquel. Tolerating a regular diet. Therapy evaluations completed and patient is to be admitted for a comprehensive rehab  program on 11/27/2018.  Complete NIHSS TOTAL: 1  Patient[facility] has been reviewed by the rehabilitation admission coordinator and physician.  Past Medical History      Past Medical History:  Diagnosis Date   Allergic rhinitis    Anticoagulant long-term use    eliquis   Cardiomyopathy due to systemic disease ([place])    followed by dr harding   COPD with emphysema [facility])    pulmologist-  dr Halford Chessman   Dyspnea    occasional per pt   History of colon polyps    tubular adenoma 2013   History of gout    09-05-2017 last flare-up  05/ 2019 3 wks ago, feet   History of sepsis 02/18/2017   per d/c note probable uti, acute chf, acute renal failure, hypoxia   Hyperlipidemia    Hyperplasia of prostate with lower urinary tract symptoms (LUTS)    Hypertension    OSA on CPAP    per study 08-03-2004  Severe OSA   Persistent atrial fibrillation    cardiologist --  dr Dorris Carnes--  post cardioversion 05-07-2014   Prostate cancer [facility]) urologsit-  dr Alyson Ingles--- as of 05-21-2017 per pt last PSA 11 approx.   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 ([place])    pulmologist-  dr Halford Chessman   Seasonal allergies    Sigmoid diverticulosis    Systolic and diastolic CHF, chronic [facility])    cardiologist-  dr harding   Tinea versicolor    Type 2 diabetes mellitus ([place])    Wears hearing aid in both ears     Family History   family history includes Breast cancer in his mother; Ovarian cancer in his sister; Stroke in an other family member.  Prior Rehab/Hospitalizations Has the patient had prior rehab or hospitalizations prior to admission? Yes  Has the patient had major surgery during 100 days prior to admission? No              Current  Medications  Current Facility-Administered Medications:    acetaminophen (TYLENOL) tablet 650 mg, 650 mg, Oral, Q6H PRN **OR** acetaminophen (TYLENOL) suppository 650 mg, 650 mg, Rectal, Q6H PRN, Opyd, Timothy S, MD   albuterol (PROVENTIL) (2.5 MG/3ML) 0.083% nebulizer solution 3 mL, 3 mL, Inhalation, Q6H PRN, Opyd, Ilene Qua, MD   amiodarone (PACERONE) tablet 200 mg, 200 mg, Oral, Daily, Opyd, Timothy S, MD, 200 mg at 11/27/18 1108   amLODipine (NORVASC) tablet 10 mg,  10 mg, Oral, Daily, Adhikari, Amrit, MD, 10 mg at 11/27/18 1108   atorvastatin (LIPITOR) tablet 10 mg, 10 mg, Oral, QHS, Opyd, Ilene Qua, MD, 10 mg at 11/26/18 2126   carvedilol (COREG) tablet 12.5 mg, 12.5 mg, Oral, BID WC, Opyd, Ilene Qua, MD, 12.5 mg at 11/27/18 1109   divalproex (DEPAKOTE) DR tablet 500 mg, 500 mg, Oral, Q12H, Karren Cobble, RPH, 500 mg at 11/27/18 1109   HYDROcodone-acetaminophen (NORCO/VICODIN) 5-325 MG per tablet 1 tablet, 1 tablet, Oral, Q6H PRN, Opyd, Ilene Qua, MD, 1 tablet at 11/22/18 1801   insulin aspart (novoLOG) injection 0-5 Units, 0-5 Units, Subcutaneous, QHS, Opyd, Timothy S, MD   insulin aspart (novoLOG) injection 0-9 Units, 0-9 Units, Subcutaneous, TID WC, Opyd, Ilene Qua, MD, 1 Units at 11/27/18 0647   labetalol (NORMODYNE) injection 10 mg, 10 mg, Intravenous, Q2H PRN, Shelly Coss, MD, 10 mg at 11/22/18 2337   losartan (COZAAR) tablet 25 mg, 25 mg, Oral, Daily, Adhikari, Amrit, MD, 25 mg at 11/27/18 1109   mometasone-formoterol (DULERA) 200-5 MCG/ACT inhaler 2 puff, 2 puff, Inhalation, BID, Opyd, Ilene Qua, MD, 2 puff at 11/27/18 0830   ondansetron (ZOFRAN) tablet 4 mg, 4 mg, Oral, Q6H PRN **OR** ondansetron (ZOFRAN) injection 4 mg, 4 mg, Intravenous, Q6H PRN, Opyd, Ilene Qua, MD, 4 mg at 11/23/18 2214   polyethylene glycol (MIRALAX / GLYCOLAX) packet 17 g, 17 g, Oral, Daily PRN, Opyd, Ilene Qua, MD   potassium chloride SA (K-DUR) CR tablet 40 mEq, 40 mEq, Oral, Q4H,  Adhikari, Amrit, MD, 40 mEq at 11/27/18 1109   QUEtiapine (SEROQUEL) tablet 25 mg, 25 mg, Oral, QHS, Aroor, Karena Addison R, MD   sodium chloride flush (NS) 0.9 % injection 3 mL, 3 mL, Intravenous, Once, Ward, Kristen N, DO   sodium chloride flush (NS) 0.9 % injection 3 mL, 3 mL, Intravenous, Q12H, Opyd, Timothy S, MD, 3 mL at 11/26/18 2128  Patients Current Diet:     Diet Order             Diet - low sodium heart healthy         Diet heart healthy/carb modified Room service appropriate? No; Fluid consistency: Thin  Diet effective now               Precautions / Restrictions Precautions Precautions: Fall Restrictions Weight Bearing Restrictions: No   Has the patient had 2 or more falls or a fall with injury in the past year? No  Prior Activity Level Community (5-7x/wk): very active until 2 week decline (cognitive decline and functional decline after being out of hospital)  Prior Functional Level Self Care: Did the patient need help bathing, dressing, using the toilet or eating? Independent  Indoor Mobility: Did the patient need assistance with walking from room to room (with or without device)? Independent  Stairs: Did the patient need assistance with internal or external stairs (with or without device)? Independent  Functional Cognition: Did the patient need help planning regular tasks such as shopping or remembering to take medications? Independent  Home Assistive Devices / Equipment Home Assistive Devices/Equipment: CPAP, Hearing aid Home Equipment: Cane - single point, Walker - 4 wheels  Prior Device Use: Indicate devices/aids used by the patient prior to current illness, exacerbation or injury? None of the above  Current Functional Level Cognition  Overall Cognitive Status: Impaired/Different from baseline Current Attention Level: Focused Orientation Level: (P) Oriented to person Following Commands: Follows one step commands inconsistently,  Follows one step commands with  increased time Safety/Judgement: Decreased awareness of deficits, Decreased awareness of safety General Comments: Pt continues to show cognitive decline with hallucinations while therapy present and unaware of location/situation. Pt has difficulty following commands demonstrates slow processing and difficulty sequencing. Pt requires max cues to complete commands.    Extremity Assessment (includes Sensation/Coordination)  Upper Extremity Assessment: Generalized weakness  Lower Extremity Assessment: Defer to PT evaluation LLE Deficits / Details: Mild weakness compared to right side. Grossly 4/5 in quads and hamstrings.     ADLs  Overall ADL's : Needs assistance/impaired Eating/Feeding: Modified independent, Sitting Grooming: Set up, Sitting Upper Body Bathing: Set up, Sitting Lower Body Bathing: Moderate assistance, Sitting/lateral leans, Sit to/from stand, Cueing for safety Upper Body Dressing : Set up, Sitting Lower Body Dressing: Moderate assistance, Sitting/lateral leans, Sit to/from stand Toilet Transfer: Minimal assistance, Ambulation, +2 for physical assistance, +2 for safety/equipment Toileting- Clothing Manipulation and Hygiene: Moderate assistance, Sitting/lateral lean, Sit to/from stand Functional mobility during ADLs: Minimal assistance, +2 for physical assistance, +2 for safety/equipment, Rolling walker, Cueing for safety General ADL Comments: Pt modA overall for ADL. Pt grooming at sink with set-upA overall and minguardA for stability as pt with severe posterior lean and unawareness of how exaggerated it was.    Mobility  Overal bed mobility: Needs Assistance Bed Mobility: Supine to Sit Supine to sit: Min assist Sit to supine: Min assist General bed mobility comments: Pt requires min A for supine to sit. VCs and tactile cues required to complete bed mobility. Pt demonstrates increased time to complete and loss of ability to focus on tasks.      Transfers  Overall transfer level: Needs assistance Equipment used: Rolling walker (2 wheeled) Transfers: Sit to/from Stand Sit to Stand: Mod assist, +2 physical assistance, +2 safety/equipment General transfer comment: Pt requires mod A +2 for physical assistance and safety. Requires lift assistance for sit to stand transfer with RW. Pt required VCs for hand placement, however, continued to pull up on RW. Multiple VCs required for Pt to bring feet flat on floor.    Ambulation / Gait / Stairs / Wheelchair Mobility  Ambulation/Gait Ambulation/Gait assistance: Min assist, Mod assist, +2 physical assistance, +2 safety/equipment Gait Distance (Feet): 50 Feet Assistive device: Rolling walker (2 wheeled) Gait Pattern/deviations: Decreased step length - right, Decreased step length - left, Trunk flexed, Drifts right/left, Staggering right, Antalgic, Step-to pattern General Gait Details: Pt demonstrates a significant change in ambulation after reports of pain in B feet. Pt ambulates with a step to pattern with RW +2 assist. Pt requires repeated VCs for foot placement underneath body. Pt shows decreased step length on both LE.  Also, trunk is flexed and pt drifts/staggers to the R. Gait velocity: decreased Gait velocity interpretation: <1.31 ft/sec, indicative of household ambulator    Posture / Balance Balance Overall balance assessment: Needs assistance Sitting-balance support: Feet supported, No upper extremity supported Sitting balance-Leahy Scale: Fair Postural control: Posterior lean Standing balance support: Bilateral upper extremity supported Standing balance-Leahy Scale: Poor Standing balance comment: Pt requires external support including UE support.    Special needs/care consideration BiPAP/CPAP : CPAP at home per wife CPM : no Continuous Drip IV : no Dialysis : no        Days : no Life Vest : no Oxygen : no Special Bed : no Trach Size : no Wound Vac (area) : no       Location : no Skin: no areas of concern  Bowel mgmt: last BM 11/22/2018 Bladder mgmt: incontinent, urinary catheter in place Diabetic mgmt: yes Behavioral consideration : impulsive, tangential, distracted  Chemo/radiation : no   Previous Home Environment (from acute therapy documentation) Living Arrangements: Spouse/significant other  Lives With: Spouse Available Help at Discharge: Family, Available 24 hours/day Type of Home: House Home Layout: Two level, Able to live on main level with bedroom/bathroom Home Access: Level entry, Elevator Bathroom Shower/Tub: Multimedia programmer: Handicapped Freedom: No  Discharge Living Setting Plans for Discharge Living Setting: Patient's home, Lives with (comment)(wife and mother-in-law) Type of Home at Discharge: House Discharge Home Layout: Two level, Able to live on main level with bedroom/bathroom Alternate Level Stairs-Rails: None(NA) Alternate Level Stairs-Number of Steps: NA Discharge Home Access: Stairs to enter Entrance Stairs-Rails: Can reach both Entrance Stairs-Number of Steps: 2 Discharge Bathroom Shower/Tub: Walk-in shower Discharge Bathroom Toilet: Handicapped height Discharge Bathroom Accessibility: Yes How Accessible: Accessible via walker Does the patient have any problems obtaining your medications?: No  Social/Family/Support Systems Patient Roles: Spouse, Other (Comment)(has hobbies(fish, golf, yardwork, invests)) Contact Information: spouse Katharine Look): [phone] Anticipated Caregiver: spouse Anticipated Caregiver's Contact Information: see above Ability/Limitations of Caregiver: Min A Caregiver Availability: 24/7 Discharge Plan Discussed with Primary Caregiver: Yes Is Caregiver In Agreement with Plan?: Yes Does Caregiver/Family have Issues with Lodging/Transportation while Pt is in Rehab?: No  Goals/Additional Needs Patient/Family Goal for Rehab: PT/OT: Mod  I/Supervision; SLP: Mod I/Supervision Expected length of stay: 7-10 days Cultural Considerations: NA Dietary Needs: heart healthy, carb modified, thin liquids Equipment Needs: TBD Special Service Needs: NA Pt/Family Agrees to Admission and willing to participate: Yes Program Orientation Provided & Reviewed with Pt/Caregiver Including Roles  & Responsibilities: Yes(pt and spouse)  Barriers to Discharge: Home environment access/layout, Behavior  Barriers to Discharge Comments: impulsive, fall risk.   Decrease burden of Care through IP rehab admission: NA  Possible need for SNF placement upon discharge: Not anticipated; pt has good prognosis for further progress through CIR. Pt has great social support and a wife who is willing to provide assist as needed at DC.   Patient Condition: I have reviewed medical records from [facility], spoken with RN, and patient and spouse. I met with patient at the bedside for inpatient rehabilitation assessment.  Patient will benefit from ongoing PT, OT and SLP, can actively participate in 3 hours of therapy a day 5 days of the week, and can make measurable gains during the admission.  Patient will also benefit from the coordinated team approach during an Inpatient Acute Rehabilitation admission.  The patient will receive intensive therapy as well as Rehabilitation physician, nursing, social worker, and care management interventions.  Due to bladder management, bowel management, safety, skin/wound care, disease management, medication administration, pain management and patient education the patient requires 24 hour a day rehabilitation nursing.  The patient is currently Min A x2 with mobility and Min Ax2 to Mod A for basic ADLs.  Discharge setting and therapy post discharge at home with oupatient is anticipated.  Patient has agreed to participate in the Acute Inpatient Rehabilitation Program and will admit 11/27/2018.  Preadmission Screen Completed  By:  Jhonnie Garner, 11/27/2018 12:44 PM ______________________________________________________________________   Discussed status with Dr. Naaman Plummer on 11/27/2018 at 12:01PM and received approval for admission today.  Admission Coordinator:  Jhonnie Garner, OT, time 12:01PM/Date 11/27/2018   Assessment/Plan: Diagnosis: encephalopathy, recent CVA 1. Does the need for close, 24 hr/day Medical supervision in concert with the patient's rehab needs make  it unreasonable for this patient to be served in a less intensive setting? Yes 2. Co-Morbidities requiring supervision/potential complications: sz d/o, gout 3. Due to bladder management, bowel management, safety, skin/wound care, disease management, medication administration, pain management and patient education, does the patient require 24 hr/day rehab nursing? Yes 4. Does the patient require coordinated care of a physician, rehab nurse, PT (1-2 hrs/day, 5 days/week), OT (1-2 hrs/day, 5 days/week) and SLP (1-2 hrs/day, 5 days/week) to address physical and functional deficits in the context of the above medical diagnosis(es)? Yes Addressing deficits in the following areas: balance, endurance, locomotion, strength, transferring, bowel/bladder control, bathing, dressing, feeding, grooming, toileting, cognition and psychosocial support 5. Can the patient actively participate in an intensive therapy program of at least 3 hrs of therapy 5 days a week? Yes 6. The potential for patient to make measurable gains while on inpatient rehab is good 7. Anticipated functional outcomes upon discharge from inpatients are: modified independent and supervision PT, modified independent and supervision OT, supervision and +/- min assist SLP 8. Estimated rehab length of stay to reach the above functional goals is: 7-12 days depending upon cognitive progress 9. Anticipated D/C setting: Home 10. Anticipated post D/C treatments: [place] therapy 11. Overall Rehab/Functional Prognosis:  excellent  MD Signature: Meredith Staggers, MD, [facility] [facility] 11/27/2018         Revision History Date/Time User Provider Type Action  11/27/2018 12:49 PM Meredith Staggers, MD Physician Sign  11/27/2018 12:44 PM Jhonnie Garner, OT Rehab Admission Coordinator Share  View Details Report

## 2018-11-27 NOTE — H&P (Signed)
Physical Medicine and Rehabilitation Admission H&P        Chief Complaint  Patient presents with  . Altered Mental Status  : HPI: Ronald [name] is a 73 year old right-handed male with history significant for right frontal ICH related to Eliquis and admitted 10/21/2018 until 10/27/2018 and discharged to home ambulating 240 feet without assistive device, atrial fibrillation followed by Dr. Dorris Carnes and no longer on anticoagulation, hypertension, chronic diastolic congestive heart failure, history of ascending thoracic aortic aneurysm followed as an outpatient., COPD maintained on CPAP and followed by Dr. Halford Chessman, chronic kidney disease stage III with creatinine 1.62, type 2 diabetes mellitus and seizure maintained on Keppra 500 mg twice daily.  Per chart review patient lives with spouse.  Two-level home with bed and bathroom on Main level.  Reportedly independent prior to admission.  Presented 11/22/2018 with increased altered mental status and poor balance leaning to the right side.  Urinalysis negative, COVID negative.  Cranial CT scan showed no acute or new intracranial hemorrhage.  Decreasing conspicuity of the right frontal hemorrhage and slight decrease in associated edema.  CT angiogram of head and neck with no emergent large vessel occlusion.  MRI of the brain completed 11/23/2018 again showing no acute findings or new abnormality.  Prior subacute hematoma measuring 4.2 x 2 cm.  No significant adjacent edema or mass-effect.  EEG negative for seizure.  Most recent echocardiogram after initial [place] 10/22/2018 with ejection fraction of 60% hyperdynamic systolic function.  Neurology follow-up patient's Keppra was changed to Depakote 500 mg twice daily.  Still with bouts of confusion and restlessness and maintained on Seroquel.  Tolerating a regular diet.  Therapy evaluations completed and patient was admitted for a comprehensive rehab program.   Review of Systems  Constitutional: Negative for chills  and fever.  Eyes: Negative for blurred vision and double vision.  Respiratory: Negative for cough.        Occasional dyspnea  Cardiovascular: Positive for palpitations and leg swelling. Negative for chest pain.  Gastrointestinal: Positive for constipation. Negative for heartburn, nausea and vomiting.  Genitourinary: Positive for urgency. Negative for dysuria, flank pain and hematuria.  Musculoskeletal: Positive for myalgias.  Neurological: Positive for dizziness.  All other systems reviewed and are negative.       Past Medical History:  Diagnosis Date  . Allergic rhinitis    . Anticoagulant long-term use      eliquis  . Cardiomyopathy due to systemic disease [facility])      followed by dr harding  . COPD with emphysema [facility])      pulmologist-  dr Halford Chessman  . Dyspnea      occasional per pt  . History of colon polyps      tubular adenoma 2013  . History of gout      09-05-2017 last flare-up  05/ 2019 3 wks ago, feet  . History of sepsis 02/18/2017    per d/c note probable uti, acute chf, acute renal failure, hypoxia  . Hyperlipidemia    . Hyperplasia of prostate with lower urinary tract symptoms (LUTS)    . Hypertension    . OSA on CPAP      per study 08-03-2004  Severe OSA  . Persistent atrial fibrillation      cardiologist --  dr Dorris Carnes--  post cardioversion 05-07-2014  . Prostate cancer [facility]) urologsit-  dr Alyson Ingles--- as of 05-21-2017 per pt last PSA 11 approx.    Dx  2014--  stage  T1c, Gleason 3+3=6, PSA 6.67--  Active surveillance/  04/ 2019  Stage T1b, Gleason 3+4, PSA 12.8- plan external radiation therapy    . 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 Seasonal allergies    . Sigmoid diverticulosis    . Systolic and diastolic CHF, chronic [facility])      cardiologist-  [name]  . Tinea versicolor    . Type 2 diabetes mellitus ([place])    . Wears hearing aid in both ears          Past Surgical History:  Procedure  Laterality Date  . CARDIOVERSION N/A 05/07/2014    Procedure: CARDIOVERSION;  Surgeon: [name], MD;  Location: [facility] ENDOSCOPY;  Service: Cardiovascular;  Laterality: N/A;  . CARDIOVERSION N/A 09/09/2014    Procedure: CARDIOVERSION;  Surgeon: Lelon Perla, MD;  Location: [facility] ENDOSCOPY;  Service: Cardiovascular;  Laterality: N/A;  successfully  . CATARACT EXTRACTION W/ INTRAOCULAR LENS  IMPLANT, BILATERAL   08 and 09/  2018  . COLONOSCOPY   last one 06-07-2011  . GOLD SEED IMPLANT N/A 09/09/2017    Procedure: GOLD SEED IMPLANT;  Surgeon: [name], MD;  Location: [facility];  Service: Urology;  Laterality: N/A;  . HYDROCELE EXCISION Bilateral 09/26/2015    Procedure: HYDROCELECTOMY ADULT;  Surgeon: [name], MD;  Location: [facility];  Service: Urology;  Laterality: Bilateral;  . LAMINECTOMY AND MICRODISCECTOMY LUMBAR SPINE   12-23-2003    Left L5 -- S1  . LAPAROSCOPIC BILATERAL INGUINAL HERNIA REPAIR/  UMBILICAL HERNIA REPAIR WITH MESH/  ASPIRATION LEFT HYDROCELE   07-11-2012  . NM MYOVIEW LTD   05/18/2014    Low risk study. Normal perfusion: No ischemia or infarction. Mild LV dysfunction - 46% (does not correlate with echocardiographic EF of 50-55%)  . PROSTATE BIOPSY   05/15/12    Clinically both Lobes  . SPACE OAR INSTILLATION N/A 09/09/2017    Procedure: SPACE OAR INSTILLATION;  Surgeon: [name], MD;  Location: [facility];  Service: Urology;  Laterality: N/A;  . TEE WITHOUT CARDIOVERSION N/A 05/07/2014    Procedure: TRANSESOPHAGEAL ECHOCARDIOGRAM (TEE);  Surgeon: [name], MD;  Location: [facility] ENDOSCOPY;  Service: Cardiovascular;  Laterality: N/A;   mild atherosclerosis plaque of aorta,  mild AR, MR, and TR,  no cardiac source of emboli  . TONSILLECTOMY   as child  . TRANSTHORACIC ECHOCARDIOGRAM   11/'18; 1/'19    a) In setting of sepsis: EF of 40-45%.  Diffuse hypokinesis.  No RWMA.  Biatrial enlargement.;; b) **  f/u Jan 2019: Normal LVF 55-60%** , mildly dilated aortic root(38 mm) and ascending aorta (44 mm). Compared to prior echo, LVEF has improved.  . TRANSURETHRAL RESECTION OF PROSTATE N/A 05/30/2017    Procedure: TRANSURETHRAL RESECTION OF THE PROSTATE (TURP);  Surgeon: [name], MD;  Location: [facility];  Service: Urology;  Laterality: N/A;        Family History  Problem Relation Age of Onset  . Breast cancer Mother    . Stroke Other          Unknown   . Ovarian cancer Sister    . Colon cancer Neg Hx    . Esophageal cancer Neg Hx    . Rectal cancer Neg Hx    . Stomach cancer Neg Hx     Social History:  reports that he has been smoking cigarettes.  He has a 15.00 pack-year smoking history. He has never used smokeless tobacco. He reports current alcohol use of about 14.0 standard drinks of alcohol per week. He reports that he does not use drugs. Allergies: No Known Allergies       Medications Prior to Admission  Medication Sig Dispense Refill  . albuterol (VENTOLIN HFA) 108 (90 Base) MCG/ACT inhaler Inhale 2 puffs into the lungs every 6 (six) hours as needed for wheezing or shortness of breath. 1 Inhaler 5  . amiodarone (PACERONE) 200 MG tablet Take 1 tablet (200 mg total) by mouth daily. 90 tablet 2  . aspirin EC 81 MG EC tablet Take 1 tablet (81 mg total) by mouth daily.      Marland Kitchen atorvastatin (LIPITOR) 10 MG tablet TAKE 1 TABLET BY MOUTH DAILY EACH EVENING (Patient taking differently: Take 10 mg by mouth at bedtime. ) 90 tablet 1  . budesonide-formoterol (SYMBICORT) 160-4.5 MCG/ACT inhaler Inhale 2 puffs into the lungs 2 (two) times daily. 1 Inhaler 12  . butalbital-acetaminophen-caffeine (FIORICET) 50-325-40 MG tablet Take 1 tablet by mouth every 8 (eight) hours as needed for headache. 14 tablet 0  . carvedilol (COREG) 12.5 MG tablet TAKE 1 TABLET (12.5 MG TOTAL) BY MOUTH 2 (TWO) TIMES DAILY. 180 tablet 1  . colchicine 0.6 MG tablet Take 1-2 tablets (0.6-1.2 mg total) by mouth See  admin instructions. Take 1.2 mg by mouth at onset of gout flare, then 0.6 mg two times a day as needed for flare(s)      . furosemide (LASIX) 40 MG tablet TAKE 1-2 TABLETS DAILY AS NEEDED. (Patient taking differently: Take 20-40 mg by mouth daily as needed for fluid. TAKE 1-2 TABLETS DAILY AS NEEDED.) 180 tablet 1  . KLOR-CON M20 20 MEQ tablet TAKE 2 TABLETS BY MOUTH EVERY DAY (Patient taking differently: Take 40 mEq by mouth daily. ) 180 tablet 1  . levETIRAcetam (KEPPRA) 500 MG tablet Take 1 tablet (500 mg total) by mouth 2 (two) times daily. 30 tablet 2  . losartan (COZAAR) 25 MG tablet TAKE 1 TABLET BY MOUTH EVERY DAY (Patient taking differently: Take 25 mg by mouth daily. ) 90 tablet 3  . metFORMIN (GLUCOPHAGE) 500 MG tablet Take 2 tablets (1,000 mg total) by mouth 2 (two) times daily with a meal. 360 tablet 0  . metoprolol tartrate (LOPRESSOR) 25 MG tablet Take  1 tablet if  you have breakthrough atrial fib with extra dose of 200 mg Amiodarone. (Patient taking differently: Take 25 mg by mouth See admin instructions. Take 25 mg by mouth for breakthrough A-Fib (with an extra dose of 200 mg Amiodarone)) 10 tablet 6  . montelukast (SINGULAIR) 10 MG tablet Take 1 tablet (10 mg total) by mouth every evening.      . nicotine (NICODERM CQ - DOSED IN MG/24 HOURS) 14 mg/24hr patch Place 1 patch (14 mg total) onto the skin daily. 28 patch 0  . PRESCRIPTION MEDICATION CPAP- At bedtime      . Semaglutide,0.25 or 0.5MG/DOS, (OZEMPIC, 0.25 OR 0.5 MG/DOSE,) 2 MG/1.5ML SOPN Inject 0.5 mg into the skin every Sunday.      . triamcinolone cream (KENALOG) 0.1 % Apply 1 application topically daily as needed (to affected sites).       Marland Kitchen azelastine (ASTELIN) 0.1 % nasal spray Place 1 spray into both nostrils 2 (two) times daily as needed for rhinitis or allergies. (Patient not taking: Reported on 11/22/2018)      . Blood Glucose Monitoring Suppl (ACCU-CHEK  AVIVA PLUS) w/Device KIT Use blood glucose machine as instructed  on your strips 1 kit 1  . glucose blood (ACCU-CHEK AVIVA PLUS) test strip Test 2 times daily dx e11.9 100 each 3  . Lancets (ACCU-CHEK SOFT TOUCH) lancets Check blood sugars once per day. DX: E11.9 100 each 2     Drug Regimen Review Drug regimen was reviewed and remains appropriate with no significant issues identified   Home: Home Living Family/patient expects to be discharged to:: Private residence Living Arrangements: Spouse/significant other Available Help at Discharge: Family, Available 24 hours/day Type of Home: House Home Access: Level entry, Elevator Home Layout: Two level, Able to live on main level with bedroom/bathroom Bathroom Shower/Tub: Multimedia programmer: Handicapped height Home Equipment: Pelzer - single point, Environmental consultant - 4 wheels  Lives With: Spouse   Functional History: Prior Function Level of Independence: Independent 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 VCs and tactile cues required for foot placement off EOB. Physical assist required for trunk elevation. Transfers Overall transfer level: Needs assistance Equipment used: Rolling walker (2 wheeled) Transfers: Sit to/from Stand Sit to Stand: Mod assist, +2 physical assistance, +2 safety/equipment General transfer comment: Pt requires mod A +2 for physical assistance and safety. Requires lift assistance for sit to stand transfer with RW. Pt required VCs for hand placement, however, continued to pull up on RW. Ambulation/Gait Ambulation/Gait assistance: Min assist, +2 safety/equipment Gait Distance (Feet): 100 Feet Assistive device: Rolling walker (2 wheeled) Gait Pattern/deviations: Step-through pattern, Decreased  step length - right, Decreased step length - left, Staggering right, Drifts right/left General Gait Details: Pt ambulates with a step through pattern with RW & +2 assist. Pt requires VCs to remain within RW and to increase awareness of R side deficits.  Pt running into objects on R side during ambulation. Pt easily distracted by catheter and required multiple cues for redirection to task. Gait velocity: decreased Gait velocity interpretation: 1.31 - 2.62 ft/sec, indicative of limited community ambulator     ADL: ADL Overall ADL's : Needs assistance/impaired Eating/Feeding: Modified independent, Sitting Grooming: Set up, Sitting Upper Body Bathing: Set up, Sitting Lower Body Bathing: Moderate assistance, Sitting/lateral leans, Sit to/from stand, Cueing for safety Upper Body Dressing : Set up, Sitting Lower Body Dressing: Moderate assistance, Sitting/lateral leans, Sit to/from stand Toilet Transfer: Minimal assistance, Ambulation, +2 for physical assistance, +2 for safety/equipment Toileting- Clothing Manipulation and Hygiene: Moderate assistance, Sitting/lateral lean, Sit to/from stand Functional mobility during ADLs: Minimal assistance, +2 for physical assistance, +2 for safety/equipment, Rolling walker, Cueing for safety General ADL Comments: Pt modA overall for ADL. Pt grooming at sink with set-upA overall and minguardA for stability as pt with severe posterior lean and unawareness of how exaggerated it was.   Cognition: Cognition Overall Cognitive Status: Impaired/Different from baseline Orientation Level: Oriented to person Cognition Arousal/Alertness: Awake/alert Behavior During Therapy: Agitated, Impulsive Overall Cognitive Status: Impaired/Different from baseline Area of Impairment: Orientation, Attention, Memory, Following commands, Safety/judgement, Awareness, Problem solving Orientation Level: Disoriented to, Place, Situation Current Attention Level: Focused Memory: Decreased  short-term memory, Decreased recall of precautions Following Commands: Follows one step commands inconsistently, Follows one step commands with increased time Safety/Judgement: Decreased awareness of safety, Decreased awareness  of deficits Awareness: Intellectual Problem Solving: Slow processing, Requires tactile cues, Requires verbal cues, Difficulty sequencing, Decreased initiation General Comments: Pt demonstrates decline in cognition with apparent hallucination while therapy present. Pt requires multiple cues to complete tasks and is slow to respond and process. Pt unable to state current location or situation. Pt became agitated with safety cues during ambulation, but was easily redirected.   Physical Exam: Blood pressure (!) 150/91, pulse 79, temperature 98.8 F (37.1 C), temperature source Oral, resp. rate 19, height '5\' 11"'  (1.803 m), weight 103.7 kg, SpO2 92 %. Physical Exam  Constitutional: He appears well-developed and well-nourished.  HENT:  Head: Normocephalic and atraumatic.  Eyes: Pupils are equal, round, and reactive to light. EOM are normal.  Neck: Normal range of motion. No thyromegaly present.  Cardiovascular: Normal rate and regular rhythm. Exam reveals no friction rub.  No murmur heard. Respiratory: Effort normal and breath sounds normal. No respiratory distress. He has no wheezes.  GI: Bowel sounds are normal. There is no abdominal tenderness.  Protuberant abdomen  Musculoskeletal:     Comments: Pt with tender midfoot and ankles. No obvious warmth or swelling  Neurological: He is alert.  Patient is alert sitting up at bedside commode.  Makes eye contact with examiner.  Does follow basic commands. Oriented to self only. Did not know that he had a wife or where he was. Distracted. Moves all 4 limbs but hesitant to move feet d/t pain.   Skin: Skin is warm and dry.  Psychiatric:  Pleasantly confused      Lab Results Last 48 Hours        Results for orders placed or  performed during the hospital encounter of 11/21/18 (from the past 48 hour(s))  Glucose, capillary     Status: Abnormal    Collection Time: 11/25/18 11:43 AM  Result Value Ref Range    Glucose-Capillary 116 (H) 70 - 99 mg/dL  Glucose, capillary     Status: Abnormal    Collection Time: 11/25/18  3:59 PM  Result Value Ref Range    Glucose-Capillary 179 (H) 70 - 99 mg/dL  Glucose, capillary     Status: Abnormal    Collection Time: 11/25/18  9:26 PM  Result Value Ref Range    Glucose-Capillary 115 (H) 70 - 99 mg/dL  Glucose, capillary     Status: Abnormal    Collection Time: 11/26/18  6:14 AM  Result Value Ref Range    Glucose-Capillary 112 (H) 70 - 99 mg/dL  CBC with Differential/Platelet     Status: None    Collection Time: 11/26/18  8:31 AM  Result Value Ref Range    WBC 6.9 4.0 - 10.5 K/uL    RBC 4.51 4.22 - 5.81 MIL/uL    Hemoglobin 14.9 13.0 - 17.0 g/dL    HCT 43.5 39.0 - 52.0 %    MCV 96.5 80.0 - 100.0 fL    MCH 33.0 26.0 - 34.0 pg    MCHC 34.3 30.0 - 36.0 g/dL    RDW 11.9 11.5 - 15.5 %    Platelets 188 150 - 400 K/uL    nRBC 0.0 0.0 - 0.2 %    Neutrophils Relative % 76 %    Neutro Abs 5.3 1.7 - 7.7 K/uL    Lymphocytes Relative 12 %    Lymphs Abs 0.9 0.7 - 4.0 K/uL    Monocytes Relative 9 %    Monocytes Absolute 0.6 0.1 - 1.0 K/uL    Eosinophils Relative  1 %    Eosinophils Absolute 0.1 0.0 - 0.5 K/uL    Basophils Relative 1 %    Basophils Absolute 0.0 0.0 - 0.1 K/uL    Immature Granulocytes 1 %    Abs Immature Granulocytes 0.04 0.00 - 0.07 K/uL      Comment: Performed at [facility] [address], [place], [place] [address]  Basic metabolic panel     Status: Abnormal    Collection Time: 11/26/18  8:31 AM  Result Value Ref Range    Sodium 140 135 - 145 mmol/L    Potassium 3.5 3.5 - 5.1 mmol/L    Chloride 105 98 - 111 mmol/L    CO2 22 22 - 32 mmol/L    Glucose, Bld 129 (H) 70 - 99 mg/dL    BUN 17 8 - 23 mg/dL    Creatinine, Ser 1.30 (H) 0.61 - 1.24 mg/dL     Calcium 8.8 (L) 8.9 - 10.3 mg/dL    GFR calc non Af Amer 54 (L) >60 mL/min    GFR calc Af Amer >60 >60 mL/min    Anion gap 13 5 - 15      Comment: Performed at [facility] [address] [place], [place] [address]  Ammonia     Status: None    Collection Time: 11/26/18 11:05 AM  Result Value Ref Range    Ammonia 32 9 - 35 umol/L      Comment: Performed at [facility], [place] [address] [place], [place] [address]  Valproic acid level     Status: Abnormal    Collection Time: 11/26/18 11:05 AM  Result Value Ref Range    Valproic Acid Lvl 41 (L) 50.0 - 100.0 ug/mL      Comment: Performed at [facility] [address] [place], [place] [address]  Glucose, capillary     Status: Abnormal    Collection Time: 11/26/18 11:38 AM  Result Value Ref Range    Glucose-Capillary 135 (H) 70 - 99 mg/dL    Comment 1 Notify RN      Comment 2 Document in Chart    Glucose, capillary     Status: Abnormal    Collection Time: 11/26/18  3:25 PM  Result Value Ref Range    Glucose-Capillary 112 (H) 70 - 99 mg/dL    Comment 1 Notify RN      Comment 2 Document in Chart    Glucose, capillary     Status: Abnormal    Collection Time: 11/26/18  9:25 PM  Result Value Ref Range    Glucose-Capillary 119 (H) 70 - 99 mg/dL  Basic metabolic panel     Status: Abnormal    Collection Time: 11/27/18  3:42 AM  Result Value Ref Range    Sodium 137 135 - 145 mmol/L    Potassium 3.1 (L) 3.5 - 5.1 mmol/L    Chloride 104 98 - 111 mmol/L    CO2 23 22 - 32 mmol/L    Glucose, Bld 119 (H) 70 - 99 mg/dL    BUN 19 8 - 23 mg/dL    Creatinine, Ser 1.32 (H) 0.61 - 1.24 mg/dL    Calcium 8.4 (L) 8.9 - 10.3 mg/dL    GFR calc non Af Amer 53 (L) >60 mL/min    GFR calc Af Amer >60 >60 mL/min    Anion gap 10 5 - 15      Comment: Performed at [facility], [place] [address], [place], [place] [address]  Glucose, capillary     Status: Abnormal    Collection Time: 11/27/18  6:07 AM  Result Value Ref Range     Glucose-Capillary 123 (H) 70 - 99 mg/dL    Comment 1 Notify RN       Imaging Results (Last 48 hours)  No results found.           Medical Problem List and Plan: 1.  Decreased functional ability with altered mental status secondary to recent right frontal ICH with seizure and associated encephalopathy                  -admit to inpatient rehab 2.  Antithrombotics: -DVT/anticoagulation: SCD.  Eliquis on hold due to [place].             -antiplatelet therapy: N/A 3. Pain Management: Hydrocodone 1 tablet every 6 hours as needed moderate pain 4. Mood: Provide emotional support             -antipsychotic agents: Seroquel 25 mg nightly with backup dose                              -sleep chart 5. Neuropsych: This patient is not capable of making decisions on his own behalf. 6. Skin/Wound Care: Routine skin checks 7. Fluids/Electrolytes/Nutrition: routine in and outs with follow up chemestries 8.  Atrial fibrillation.  No longer on anticoagulation due to [place].  Continue amiodarone 200 mg daily.  Follow-up cardiology service Dr. Harrington Challenger. HR controlled at present 9.  Seizure disorder.  EEG negative.  Keppra changed to Depakote 500 mg every 12 hours 10.  Chronic diastolic congestive heart failure.  Coreg 12.5 mg twice daily, Cozaar 25 mg daily.  Monitor for any signs of fluid overload             -check daily weights 11.  CKD stage III.  Creatinine baseline 1.62.  Follow-up chemistries 12.  COPD/CPAP.  Follow-up Dr. Halford Chessman.  Continue Dulera 13.  Type 2 diabetes mellitus.  Hemoglobin A1c 6.9.  SSI.  Check blood sugars before meals and at bedtime.  Patient on Glucophage 1000 mg twice daily prior to admission.               -resume glucophage as needed 14.  History of ascending thoracic aortic aneurysm.  Follow-up as outpatient 15.  Hyperlipidemia.  Lipitor    Post Admission Physician Evaluation: 1. Functional deficits secondary  to ICH/seizure/encephalopathy. 2. Patient is admitted to receive  collaborative, interdisciplinary care between the physiatrist, rehab nursing staff, and therapy team. 3. Patient's level of medical complexity and substantial therapy needs in context of that medical necessity cannot be provided at a lesser intensity of care such as a SNF. 4. Patient has experienced substantial functional loss from his/her baseline which was documented above under the "Functional History" and "Functional Status" headings.  Judging by the patient's diagnosis, physical exam, and functional history, the patient has potential for functional progress which will result in measurable gains while on inpatient rehab.  These gains will be of substantial and practical use upon discharge  in facilitating mobility and self-care at the household level. 5. Physiatrist will provide 24 hour management of medical needs as well as oversight of the therapy plan/treatment and provide guidance as appropriate regarding the interaction of the two. 6. The Preadmission Screening has been reviewed and patient status is unchanged unless otherwise stated  above. 7. 24 hour rehab nursing will assist with bladder management, bowel management, safety, skin/wound care, disease management, medication administration, pain management and patient education  and help integrate therapy concepts, techniques,education, etc. 8. PT will assess and treat for/with: Lower extremity strength, range of motion, stamina, balance, functional mobility, safety, adaptive techniques and equipment, NMR.   Goals are: mod I to supervision. 9. OT will assess and treat for/with: ADL's, functional mobility, safety, upper extremity strength, adaptive techniques and equipment, NMR.   Goals are: mod I to supervision. Therapy may proceed with showering this patient. 10. SLP will assess and treat for/with: cognition, communication.  Goals are: supervision. 11. Case Management and Social Worker will assess and treat for psychological issues and discharge  planning. 12. Team conference will be held weekly to assess progress toward goals and to determine barriers to discharge. 13. Patient will receive at least 3 hours of therapy per day at least 5 days per week. 14. ELOS: 7-10 days       15. Prognosis:  excellent   I have personally performed a face to face diagnostic evaluation of this patient and formulated the key components of the plan.  Additionally, I have personally reviewed laboratory data, imaging studies, as well as relevant notes and concur with the physician assistant's documentation above.  Meredith Staggers, MD, FAAPMR   Lavon Paganini Shoshoni, PA-C 11/27/2018

## 2018-11-27 NOTE — Progress Notes (Signed)
Pt arrived to unit via bed he is sleepy arouses easily, oriented to self only pt  has poor attention and concentration unable to answer questions. Wife is not present. Tele sitter for patient safety. Pt did not tolerate neuro assessment to BLE d/t pain , the pain in the LLE > RLE. Pt does follow commands.  In bed with 3 SR call light within reach.

## 2018-11-27 NOTE — Progress Notes (Signed)
Inpatient Rehabilitation-Admissions Coordinator   [facility] has medical clearance for admit to CIR today. Pt and wife in agreement. AC has confirmed with [employer] that pt's Medicare is primary and his [employer] is secondary.   RN, CM/SW updated on plans for CIR today.   Please call if questions.   Jhonnie Garner, OTR/L  Rehab Admissions Coordinator  [phone] 11/27/2018 12:47 PM

## 2018-11-27 NOTE — Progress Notes (Signed)
Pt arrived to unit via wheelchair with nurse and nurse tech. No complaints of pain. Telesitter in place, bed in low position, call light within reach. Pt resting, will continue to monitor.

## 2018-11-27 NOTE — Progress Notes (Signed)
Physical Therapy Treatment Patient Details Name: Ronald [name] MRN: [id number] DOB: [birth year] Today's Date: 11/27/2018    History of Present Illness Pt is a 73 y/o male who presents with progressing confusion and unsteady gait. Pt with recent admission for R frontal ICH with no new bleed seen on imaging. PMH significant for seizure, HTN, diastolic heart failure, DMII, COPD, CKD III.     PT Comments    Pt continuing to progress towards goals, however, pt reports new onset of pain in B feet and continues to show apparent confusion. Pt not oriented to place or situation. Pt performs mobility tasks at the min A to mod A +2 levels for physical assistance and safety. Continues to show slow processing and difficulty sequencing. Requires max cues to perform tasks and requires consistent redirection. New onset of pain inhibits pt's ability to ambulate further distance in today's session. Despite this, PT still feels he could tolerate increased intensities of CIR level therapies. Pt continues to benefit from CIR level therapies to promote functional independence and safety upon discharge home to wife.    Follow Up Recommendations  Supervision/Assistance - 24 hour;CIR     Equipment Recommendations  None recommended by PT    Recommendations for Other Services       Precautions / Restrictions Precautions Precautions: Fall Restrictions Weight Bearing Restrictions: No    Mobility  Bed Mobility Overal bed mobility: Needs Assistance Bed Mobility: Supine to Sit     Supine to sit: Min assist     General bed mobility comments: Pt requires min A for supine to sit. VCs and tactile cues required to complete bed mobility. Pt demonstrates increased time to complete tasks due to decreased attention.  Transfers Overall transfer level: Needs assistance Equipment used: Rolling walker (2 wheeled) Transfers: Sit to/from Stand Sit to Stand: Mod assist;+2 physical assistance;+2 safety/equipment          General transfer comment: Pt requires mod A +2 for physical assistance and safety. Requires lift assistance for sit to stand transfer with RW. Pt required VCs for hand placement, however, continued to pull up on RW. Multiple VCs required for Pt to bring feet flat on floor.  Ambulation/Gait Ambulation/Gait assistance: Min assist;Mod assist;+2 physical assistance;+2 safety/equipment Gait Distance (Feet): 50 Feet Assistive device: Rolling walker (2 wheeled) Gait Pattern/deviations: Decreased step length - right;Decreased step length - left;Trunk flexed;Drifts right/left;Antalgic;Step-to pattern Gait velocity: decreased Gait velocity interpretation: <1.31 ft/sec, indicative of household ambulator General Gait Details: Pt demonstrates a significant change in ambulation after reports of pain in B feet. Pt ambulates with a step to pattern with RW +2 assist. Pt requires repeated VCs for foot placement underneath body. Pt shows decreased step length on both LE.  Also, trunk is flexed and pt drifts to the R.   Stairs             Wheelchair Mobility    Modified Rankin (Stroke Patients Only) Modified Rankin (Stroke Patients Only) Pre-Morbid Rankin Score: No symptoms Modified Rankin: Moderately severe disability     Balance Overall balance assessment: Needs assistance Sitting-balance support: Feet supported;No upper extremity supported Sitting balance-Leahy Scale: Fair   Postural control: Posterior lean Standing balance support: Bilateral upper extremity supported Standing balance-Leahy Scale: Poor Standing balance comment: Pt requires external support including UE support.                            Cognition Arousal/Alertness: Awake/alert Behavior During Therapy: Impulsive Overall  Cognitive Status: Impaired/Different from baseline Area of Impairment: Problem solving;Awareness;Safety/judgement;Following commands;Memory;Attention;Orientation                  Orientation Level: Disoriented to;Place;Situation Current Attention Level: Focused Memory: Decreased recall of precautions;Decreased short-term memory Following Commands: Follows one step commands inconsistently;Follows one step commands with increased time Safety/Judgement: Decreased awareness of deficits;Decreased awareness of safety Awareness: Intellectual Problem Solving: Slow processing;Decreased initiation;Difficulty sequencing;Requires verbal cues;Requires tactile cues General Comments: Pt continues to show cognitive decline with hallucinations while therapy present and unaware of location/situation. Pt has difficulty following commands demonstrates slow processing and difficulty sequencing. Pt requires max cues to complete commands.      Exercises      General Comments        Pertinent Vitals/Pain Pain Assessment: Faces Faces Pain Scale: Hurts even more Pain Location: B feet Pain Descriptors / Indicators: Grimacing Pain Intervention(s): Limited activity within patient's tolerance;Monitored during session    Home Living                      Prior Function            PT Goals (current goals can now be found in the care plan section) Acute Rehab PT Goals Patient Stated Goal: wanted to brush teeth PT Goal Formulation: With patient Time For Goal Achievement: 12/08/18 Potential to Achieve Goals: Good Progress towards PT goals: Progressing toward goals    Frequency    Min 4X/week      PT Plan Current plan remains appropriate    Co-evaluation              AM-PAC PT "6 Clicks" Mobility   Outcome Measure  Help needed turning from your back to your side while in a flat bed without using bedrails?: A Little Help needed moving from lying on your back to sitting on the side of a flat bed without using bedrails?: A Little Help needed moving to and from a bed to a chair (including a wheelchair)?: A Lot Help needed standing up from a chair using your  arms (e.g., wheelchair or bedside chair)?: A Lot Help needed to walk in hospital room?: A Little Help needed climbing 3-5 steps with a railing? : A Lot 6 Click Score: 15    End of Session Equipment Utilized During Treatment: Gait belt Activity Tolerance: Patient limited by pain Patient left: in chair;with call bell/phone within reach;with chair alarm set Nurse Communication: Mobility status PT Visit Diagnosis: Unsteadiness on feet (R26.81);Other abnormalities of gait and mobility (R26.89);Muscle weakness (generalized) (M62.81);Pain Pain - part of body: Ankle and joints of foot     Time: 0998-3382 PT Time Calculation (min) (ACUTE ONLY): 40 min  Charges:  $Gait Training: 23-37 mins $Therapeutic Activity: 8-22 mins                     Christophe Louis, SPT   Santosha Jividen 11/27/2018, 1:40 PM

## 2018-11-27 NOTE — Discharge Summary (Signed)
Physician Discharge Summary  Ronald [name] CNO:[id number] DOB: Nov 04, 1945 DOA: 11/21/2018  PCP: [name], MD  Admit date: 11/21/2018 Discharge date: 11/27/2018  Admitted From: Home Disposition:  Home  Discharge Condition:Stable CODE STATUS:FULL Diet recommendation: Heart Healthy   Brief/Interim Summary:  Patient is a 73 year old male with history of recent right frontal ICH, A. fib not on anticoagulation, hypertension, chronic diastolic CHF, COPD, CKD stage III, diabetes type 2, seizure disorder who presents from home for evaluation of worsening confusion and gait problem. Neurology has been consulted and following. There was also report of one seizure episode.  CT imaging of the brain did not show any acute intracranial abnormality and  shows evolution of right frontal intraparenchymal hemorrhage. Patient admitted for the evaluation of altered mental status. Underwent MRI without finding of any acute intracranial abnormalities. PT/OT recommended CIR.  CIR consulted.   He is hemodynamically stable for discharge to CIR today.  Following problems were addressed during his hospitalization:  Altered mental status: Suspected to be from [place] versus behavioral/personality changes secondary to frontal lobe hematoma. Keppra has been discontinued.  Started on Depakote. EEG did not show any epileptiform activities. MRI did not show any acute intracranial abnormalities. His mental status waxes and wanes. He is confused mostly at night. Likely sundowning.Started on Seroquel.  Neurology signed off and recommended to follow-up as an outpatient.  Recent intracerebral hemorrhage: Currently stable.  Brain imaging  finding as above.  Seizures: Reported of an episode of seizure recently.  Was on Keppra at home.  Keppra changed to Depakote.  Continue seizure precaution.  Paroxysmal A. fib: Currently in sinus rhythm.CHADS-VASc 7.  Not on anticoagulation due to history of ICH.  Continue  amiodarone.  Rate is well controlled.  Hypertension: Continue to monitor blood pressure.  Added amlodipine.  Type 2 diabetes mellitus: Hemoglobin A1c 6.9 in July 2020.  On metformin, Ozempic at home   Chronic diastolic CHF: Currently compensated.  Lasix on hold due to AKI.   OSA: Continue CPAP at night.  History of ascending thoracic aortic aneurysm:  Imaging showed  4.1 cm noted incidentally on CTA neck in ED.  Follow-up imaging recommended as an outpatient.  Debility/deconditioning: PT/OT evaluation requested.recommended CIR.     Discharge Diagnoses:  Principal Problem:   Acute encephalopathy Active Problems:   Obstructive sleep apnea   Essential hypertension   Type 2 diabetes mellitus with hyperglycemia (HCC)   Paroxysmal atrial fibrillation (HCC)   Chronic diastolic CHF (congestive heart failure) (HCC)   ICH (intracerebral hemorrhage) (HCC) w/ SAH while on Eliquis   Seizures ([place]), secondsry to [place]   CKD (chronic kidney disease), stage III ([place])   Thoracic ascending aortic aneurysm ([place])   AMS (altered mental status)    Discharge Instructions  Discharge Instructions    Ambulatory referral to Neurology   Complete by: As directed    An appointment is requested in approximately: 4 weeks   Diet - low sodium heart healthy   Complete by: As directed    Discharge instructions   Complete by: As directed    1)Please take prescribed medications as instructed. 2)Follow up with neurology in 4 weeks.  Name and number the provider group has been attached. 3)Do a BMP test in a week.   Increase activity slowly   Complete by: As directed      Allergies as of 11/27/2018   No Known Allergies     Medication List    STOP taking these medications   levETIRAcetam 500 MG  tablet Commonly known as: KEPPRA   metoprolol tartrate 25 MG tablet Commonly known as: LOPRESSOR     TAKE these medications   Accu-Chek Aviva Plus w/Device Kit Use blood glucose machine as  instructed on your strips   accu-chek soft touch lancets Check blood sugars once per day. DX: E11.9   albuterol 108 (90 Base) MCG/ACT inhaler Commonly known as: Ventolin HFA Inhale 2 puffs into the lungs every 6 (six) hours as needed for wheezing or shortness of breath.   amiodarone 200 MG tablet Commonly known as: PACERONE Take 1 tablet (200 mg total) by mouth daily.   amLODipine 10 MG tablet Commonly known as: NORVASC Take 1 tablet (10 mg total) by mouth daily. Start taking on: November 28, 2018   aspirin 81 MG EC tablet Take 1 tablet (81 mg total) by mouth daily.   atorvastatin 10 MG tablet Commonly known as: LIPITOR TAKE 1 TABLET BY MOUTH DAILY EACH EVENING What changed: See the new instructions.   azelastine 0.1 % nasal spray Commonly known as: ASTELIN Place 1 spray into both nostrils 2 (two) times daily as needed for rhinitis or allergies.   budesonide-formoterol 160-4.5 MCG/ACT inhaler Commonly known as: Symbicort Inhale 2 puffs into the lungs 2 (two) times daily.   butalbital-acetaminophen-caffeine 50-325-40 MG tablet Commonly known as: FIORICET Take 1 tablet by mouth every 8 (eight) hours as needed for headache.   carvedilol 12.5 MG tablet Commonly known as: COREG TAKE 1 TABLET (12.5 MG TOTAL) BY MOUTH 2 (TWO) TIMES DAILY.   colchicine 0.6 MG tablet Take 1-2 tablets (0.6-1.2 mg total) by mouth See admin instructions. Take 1.2 mg by mouth at onset of gout flare, then 0.6 mg two times a day as needed for flare(s)   divalproex 500 MG DR tablet Commonly known as: DEPAKOTE Take 1 tablet (500 mg total) by mouth every 12 (twelve) hours.   furosemide 40 MG tablet Commonly known as: LASIX TAKE 1-2 TABLETS DAILY AS NEEDED. What changed:   how much to take  how to take this  when to take this  reasons to take this   glucose blood test strip Commonly known as: Accu-Chek Aviva Plus Test 2 times daily dx e11.9   Klor-Con M20 20 MEQ tablet Generic drug:  potassium chloride SA TAKE 2 TABLETS BY MOUTH EVERY DAY What changed: how much to take   losartan 25 MG tablet Commonly known as: COZAAR TAKE 1 TABLET BY MOUTH EVERY DAY   metFORMIN 500 MG tablet Commonly known as: GLUCOPHAGE Take 2 tablets (1,000 mg total) by mouth 2 (two) times daily with a meal.   montelukast 10 MG tablet Commonly known as: SINGULAIR Take 1 tablet (10 mg total) by mouth every evening.   nicotine 14 mg/24hr patch Commonly known as: NICODERM CQ - dosed in mg/24 hours Place 1 patch (14 mg total) onto the skin daily.   Ozempic (0.25 or 0.5 MG/DOSE) 2 MG/1.5ML Sopn Generic drug: Semaglutide(0.25 or 0.5MG/DOS) Inject 0.5 mg into the skin every Sunday.   PRESCRIPTION MEDICATION CPAP- At bedtime   QUEtiapine 25 MG tablet Commonly known as: SEROQUEL Take 1 tablet (25 mg total) by mouth at bedtime.   triamcinolone cream 0.1 % Commonly known as: KENALOG Apply 1 application topically daily as needed (to affected sites).      Follow-up Information    [facility]. Schedule an appointment as soon as possible for a visit in 4 week(s).   Specialty: Neurology Contact information: [address]  [place] [address] [phone]         No Known Allergies  Consultations:  Neurology   Procedures/Studies: Ct Angio Head W Or Wo Contrast  Result Date: 11/22/2018 CLINICAL DATA:  Possible vasculitis. EXAM: CT ANGIOGRAPHY HEAD AND NECK TECHNIQUE: Multidetector CT imaging of the head and neck was performed using the standard protocol during bolus administration of intravenous contrast. Multiplanar CT image reconstructions and MIPs were obtained to evaluate the vascular anatomy. Carotid stenosis measurements (when applicable) are obtained utilizing NASCET criteria, using the distal internal carotid diameter as the denominator. CONTRAST:  17m OMNIPAQUE IOHEXOL 350 MG/ML SOLN COMPARISON:  CTA head neck 11/21/2018 FINDINGS:  CTA NECK FINDINGS SKELETON: Multilevel degenerative disc disease and facet hypertrophy without bony spinal canal stenosis. OTHER NECK: Normal pharynx, larynx and major salivary glands. No cervical lymphadenopathy. Unremarkable thyroid gland. UPPER CHEST: No pneumothorax or pleural effusion. No nodules or masses. AORTIC ARCH: There is mild calcific atherosclerosis of the aortic arch. The ascending thoracic aorta measures 4.1 cm in diameter. Normal variant aortic arch branching pattern with the brachiocephalic and left common carotid arteries sharing a common origin. The visualized proximal subclavian arteries are widely patent. RIGHT CAROTID SYSTEM: --Common carotid artery: There is a small atherosclerotic web extending from the right carotid bifurcation into the proximal right internal carotid artery --Internal carotid artery: Atherosclerotic web extends into the proximal ICA but does not cause stenosis. Remainder of the artery is normal. --External carotid artery: No acute abnormality. LEFT CAROTID SYSTEM: --Common carotid artery: Widely patent origin without common carotid artery dissection or aneurysm. --Internal carotid artery: Normal without aneurysm, dissection or stenosis. --External carotid artery: No acute abnormality. VERTEBRAL ARTERIES: Right dominant configuration. Both origins are clearly patent. No dissection, occlusion or flow-limiting stenosis to the skull base (V1-V3 segments). Multifocal atherosclerotic calcification. CTA HEAD FINDINGS POSTERIOR CIRCULATION: --Vertebral arteries: Normal V4 segments. --Posterior inferior cerebellar arteries (PICA): Patent origins from the vertebral arteries. --Anterior inferior cerebellar arteries (AICA): Patent origins from the basilar artery. --Basilar artery: Normal. --Superior cerebellar arteries: Normal. --Posterior cerebral arteries (PCA): Normal. Both originate from the basilar artery. Posterior communicating arteries (p-comm) are diminutive or absent.  ANTERIOR CIRCULATION: --Intracranial internal carotid arteries: Atherosclerotic calcification of the internal carotid arteries at the skull base without hemodynamically significant stenosis. --Anterior cerebral arteries (ACA): Normal. Both A1 segments are present. Patent anterior communicating artery (a-comm). --Middle cerebral arteries (MCA): Normal. VENOUS SINUSES: As permitted by contrast timing, patent. ANATOMIC VARIANTS: None Review of the MIP images confirms the above findings. IMPRESSION: 1. No emergent large vessel occlusion or evidence of CNS vasculitis. 2. 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 This is new compared to the prior study. 3. 4.1 cm ascending thoracic aortic aneurysm. Recommend annual imaging followup by CTA or MRA. This recommendation follows 2010 ACCF/AHA/AATS/ACR/ASA/SCA/SCAI/SIR/STS/SVM Guidelines for the Diagnosis and Management of Patients with Thoracic Aortic Disease. Circulation. 2010; 121:: W413-K440 Aortic aneurysm NOS (ICD10-I71.9) Aortic atherosclerosis (ICD10-I70.0). Electronically Signed   By: KUlyses JarredM.D.   On: 11/22/2018 03:40   Ct Head Wo Contrast  Result Date: 11/22/2018 CLINICAL DATA:  Intracranial hemorrhage, known. Follow-up. EXAM: CT HEAD WITHOUT CONTRAST TECHNIQUE: Contiguous axial images were obtained from the base of the skull through the vertex without intravenous contrast. COMPARISON:  Head CTs dated 11/21/2018 and 11/13/2018. FINDINGS: Brain: Continued expected evolution of the previously demonstrated intraparenchymal hemorrhage within the RIGHT frontal lobe, again less conspicuous than on earlier studies. The associated parenchymal edema within the RIGHT frontal lobe is  stable in extent. No associated mass effect or midline shift appreciated. No new parenchymal or extra-axial hemorrhage. No new edema. Ventricles are stable in size and configuration. Vascular: Chronic calcified atherosclerotic changes of the  large vessels at the skull base. No unexpected hyperdense vessel. Skull: Normal. Negative for fracture or focal lesion. Sinuses/Orbits: No acute finding. Other: None. IMPRESSION: 1. Continued expected evolution of the previously demonstrated intraparenchymal hemorrhage within the RIGHT frontal lobe, again less conspicuous than on earlier studies. Associated parenchymal edema within the RIGHT frontal lobe is stable in extent. No associated mass effect or midline shift. 2. No new intracranial abnormality. No new or acute intracranial hemorrhage. Electronically Signed   By: Franki Cabot M.D.   On: 11/22/2018 19:54   Ct Head Wo Contrast  Result Date: 11/21/2018 CLINICAL DATA:  73 year old male with increased confusion and memory loss over the past 2 days. Recent right frontal lobe hemorrhage. EXAM: CT HEAD WITHOUT CONTRAST TECHNIQUE: Contiguous axial images were obtained from the base of the skull through the vertex without intravenous contrast. COMPARISON:  Head CT dated 11/14/2018 FINDINGS: Brain: The right frontal intraparenchymal hemorrhage is less conspicuous in keeping with evolution of blood product. There is associated edema in the right frontal lobe which is similar in size or slightly decreased. There is mild age-related atrophy and chronic microvascular ischemic changes. No new or acute intracranial hemorrhage. No mass effect or midline shift. No extra-axial fluid collection. Vascular: No hyperdense vessel or unexpected calcification. Skull: Normal. Negative for fracture or focal lesion. Sinuses/Orbits: No acute finding. Other: None IMPRESSION: 1. No acute or new intracranial hemorrhage. 2. Decrease in conspicuity of the right frontal hemorrhage and slight decrease in associated edema. Electronically Signed   By: Anner Crete M.D.   On: 11/21/2018 23:10   Ct Head Wo Contrast  Result Date: 11/13/2018 CLINICAL DATA:  Follow-up hemorrhagic infarct. Increased confusion, weakness and lethargy. EXAM: CT  HEAD WITHOUT CONTRAST TECHNIQUE: Contiguous axial images were obtained from the base of the skull through the vertex without intravenous contrast. COMPARISON:  Head CT 10/23/2018 FINDINGS: Brain: Evolutionary changes in the right ACA hemorrhagic infarct. The hematoma is much smaller now measuring a maximum of 2 cm and previously measuring 4 cm. There is also been contraction of the area of infarct with some residual encephalomalacia. No findings to suggest a hree bleed or new stroke. The ventricles are in the midline. Stable mild mass effect on the frontal horn on the right side. No extra-axial fluid collections. Brainstem and cerebellum appear normal and stable. Vascular: Stable advanced vascular calcifications but no definite aneurysm or hyperdense vessels. Skull: No skull fractures or bone lesions Sinuses/Orbits: . the paranasal sinuses and mastoid air cells are clear. The globes are intact. Other: No scalp lesions or hematoma. IMPRESSION: 1. Expected evolutionary changes in the right ACA territory infarct with contraction of the hematoma and expected encephalomalacia. 2. No new/acute intracranial findings. Electronically Signed   By: Marijo Sanes M.D.   On: 11/13/2018 18:48   Ct Angio Neck W Or Wo Contrast  Result Date: 11/22/2018 CLINICAL DATA:  Possible vasculitis. EXAM: CT ANGIOGRAPHY HEAD AND NECK TECHNIQUE: Multidetector CT imaging of the head and neck was performed using the standard protocol during bolus administration of intravenous contrast. Multiplanar CT image reconstructions and MIPs were obtained to evaluate the vascular anatomy. Carotid stenosis measurements (when applicable) are obtained utilizing NASCET criteria, using the distal internal carotid diameter as the denominator. CONTRAST:  168m OMNIPAQUE IOHEXOL 350 MG/ML SOLN COMPARISON:  CTA  head neck 11/21/2018 FINDINGS: CTA NECK FINDINGS SKELETON: Multilevel degenerative disc disease and facet hypertrophy without bony spinal canal stenosis.  OTHER NECK: Normal pharynx, larynx and major salivary glands. No cervical lymphadenopathy. Unremarkable thyroid gland. UPPER CHEST: No pneumothorax or pleural effusion. No nodules or masses. AORTIC ARCH: There is mild calcific atherosclerosis of the aortic arch. The ascending thoracic aorta measures 4.1 cm in diameter. Normal variant aortic arch branching pattern with the brachiocephalic and left common carotid arteries sharing a common origin. The visualized proximal subclavian arteries are widely patent. RIGHT CAROTID SYSTEM: --Common carotid artery: There is a small atherosclerotic web extending from the right carotid bifurcation into the proximal right internal carotid artery --Internal carotid artery: Atherosclerotic web extends into the proximal ICA but does not cause stenosis. Remainder of the artery is normal. --External carotid artery: No acute abnormality. LEFT CAROTID SYSTEM: --Common carotid artery: Widely patent origin without common carotid artery dissection or aneurysm. --Internal carotid artery: Normal without aneurysm, dissection or stenosis. --External carotid artery: No acute abnormality. VERTEBRAL ARTERIES: Right dominant configuration. Both origins are clearly patent. No dissection, occlusion or flow-limiting stenosis to the skull base (V1-V3 segments). Multifocal atherosclerotic calcification. CTA HEAD FINDINGS POSTERIOR CIRCULATION: --Vertebral arteries: Normal V4 segments. --Posterior inferior cerebellar arteries (PICA): Patent origins from the vertebral arteries. --Anterior inferior cerebellar arteries (AICA): Patent origins from the basilar artery. --Basilar artery: Normal. --Superior cerebellar arteries: Normal. --Posterior cerebral arteries (PCA): Normal. Both originate from the basilar artery. Posterior communicating arteries (p-comm) are diminutive or absent. ANTERIOR CIRCULATION: --Intracranial internal carotid arteries: Atherosclerotic calcification of the internal carotid arteries at  the skull base without hemodynamically significant stenosis. --Anterior cerebral arteries (ACA): Normal. Both A1 segments are present. Patent anterior communicating artery (a-comm). --Middle cerebral arteries (MCA): Normal. VENOUS SINUSES: As permitted by contrast timing, patent. ANATOMIC VARIANTS: None Review of the MIP images confirms the above findings. IMPRESSION: 1. No emergent large vessel occlusion or evidence of CNS vasculitis. 2. 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 This is new compared to the prior study. 3. 4.1 cm ascending thoracic aortic aneurysm. Recommend annual imaging followup by CTA or MRA. This recommendation follows 2010 ACCF/AHA/AATS/ACR/ASA/SCA/SCAI/SIR/STS/SVM Guidelines for the Diagnosis and Management of Patients with Thoracic Aortic Disease. Circulation. 2010; 121: [id number]. Aortic aneurysm NOS (ICD10-I71.9) Aortic atherosclerosis (ICD10-I70.0). Electronically Signed   By: Ulyses Jarred M.D.   On: 11/22/2018 03:40   Mr Brain Wo Contrast  Result Date: 11/14/2018 CLINICAL DATA:  Altered mental status. History of hemorrhagic stroke 3 weeks ago. EXAM: MRI HEAD WITHOUT CONTRAST TECHNIQUE: Multiplanar, multiecho pulse sequences of the brain and surrounding structures were obtained without intravenous contrast. COMPARISON:  Brain MRI 10/22/2018 and head CT same day FINDINGS: Brain: There aging blood products at the site of previously demonstrated right frontal lobe intraparenchymal hemorrhage. No acute hemorrhage. There is minimal adjacent edema, improved from the prior study. Leftward bulging of the right cingulate gyrus is unchanged. There is no acute ischemia. Vascular: Normal flow voids. Skull and upper cervical spine: Normal marrow signal. Sinuses/Orbits: Negative. Other: None. IMPRESSION: 1. No acute intracranial abnormality. 2. Aging blood products at the site of recent right frontal lobe hemorrhage. Electronically Signed   By: Ulyses Jarred M.D.   On: 11/14/2018 00:34   Mr Jeri Cos SP Contrast  Result Date: 11/23/2018 CLINICAL DATA:  Encephalopathy. EXAM: MRI HEAD WITHOUT AND WITH CONTRAST TECHNIQUE: Multiplanar, multiecho pulse sequences of the brain and surrounding structures were obtained without and with intravenous contrast. CONTRAST:  10 cc Gadavist intravenous COMPARISON:  Brain MRI from 9 days ago FINDINGS: Brain: Subacute hematoma in the parafalcine anterior right frontal lobe T1 and T2 hyperintensity. The hematoma measures up to 4.2 x 2 cm on axial slices. No significant adjacent edema or mass effect. Mild small vessel ischemic changes seen in the cerebral white matter and pons. No abnormal enhancement. No acute infarct, acute hemorrhage, hydrocephalus, or evident neoplasm. There are a few remote micro hemorrhages and left occipital parietal junction superficial siderosis that is also stable. Usually, amyloid angiopathy is more extensive/generalized. Vascular: Major flow voids and vascular enhancements are preserved Skull and upper cervical spine: Negative for marrow lesion Sinuses/Orbits: Bilateral cataract resection. IMPRESSION: 1. Contracting subacute right frontal hematoma without vasogenic edema or mass effect. 2. No acute finding or new abnormality. Electronically Signed   By: Monte Fantasia M.D.   On: 11/23/2018 13:09       Subjective:  Patient seen and examined the bedside this morning.  More calm and cooperative this morning.  Alert and awake.  Hemodynamically stable for discharge  Discharge Exam: Vitals:   11/27/18 0804 11/27/18 0830  BP: (!) 149/93   Pulse: 77   Resp: 16 16  Temp: 98.2 F (36.8 C)   SpO2: 94%    Vitals:   11/27/18 0349 11/27/18 0546 11/27/18 0804 11/27/18 0830  BP: (!) 150/91  (!) 149/93   Pulse: 79  77   Resp: '19  16 16  ' Temp: 98.8 F (37.1 C)  98.2 F (36.8 C)   TempSrc: Oral  Oral   SpO2: 92%  94%   Weight:  103.7 kg    Height:        General: Pt is alert, awake, not  in acute distress Cardiovascular: RRR, S1/S2 +, no rubs, no gallops Respiratory: CTA bilaterally, no wheezing, no rhonchi Abdominal: Soft, NT, ND, bowel sounds + Extremities: no edema, no cyanosis    The results of significant diagnostics from this hospitalization (including imaging, microbiology, ancillary and laboratory) are listed below for reference.     Microbiology: Recent Results (from the past 240 hour(s))  SARS CORONAVIRUS 2 Nasal Swab Aptima Multi Swab     Status: None   Collection Time: 11/22/18  4:58 AM   Specimen: Aptima Multi Swab; Nasal Swab  Result Value Ref Range Status   SARS Coronavirus 2 NEGATIVE NEGATIVE Final    Comment: (NOTE) SARS-CoV-2 target nucleic acids are NOT DETECTED. The SARS-CoV-2 RNA is generally detectable in upper and lower respiratory specimens during the acute phase of infection. Negative results do not preclude SARS-CoV-2 infection, do not rule out co-infections with other pathogens, and should not be used as the sole basis for treatment or other patient management decisions. Negative results must be combined with clinical observations, patient history, and epidemiological information. The expected result is Negative. Fact Sheet for Patients: SugarRoll.be Fact Sheet for Healthcare Providers: https://www.woods-mathews.com/ This test is not yet approved or cleared by the [place] FDA and  has been authorized for detection and/or diagnosis of SARS-CoV-2 by FDA under an Emergency Use Authorization (EUA). This EUA will remain  in effect (meaning this test can be used) for the duration of the COVID-19 declaration under Section 56 4(b)(1) of the Act, 21 U.S.C. section 360bbb-3(b)(1), unless the authorization is terminated or revoked sooner. Performed at [facility], [place] [address], [place], [place] [address]      Labs: BNP (last 3 results) No results for input(s): BNP in the last 8760  hours. Basic Metabolic Panel: Recent Labs  Lab 11/22/18 1332 11/23/18 0448 11/24/18 0810 11/26/18 0831 11/27/18 0342  NA 140 137 138 140 137  K 4.6 4.2 3.5 3.5 3.1*  CL 106 102 106 105 104  CO2 '23 24 24 22 23  ' GLUCOSE 134* 174* 126* 129* 119*  BUN '16 12 10 17 19  ' CREATININE 1.24 1.31* 1.17 1.30* 1.32*  CALCIUM 9.1 8.6* 8.4* 8.8* 8.4*   Liver Function Tests: Recent Labs  Lab 11/21/18 1644  AST 18  ALT 21  ALKPHOS 82  BILITOT 0.8  PROT 6.5  ALBUMIN 3.8   No results for input(s): LIPASE, AMYLASE in the last 168 hours. Recent Labs  Lab 11/26/18 1105  AMMONIA 32   CBC: Recent Labs  Lab 11/21/18 1644 11/22/18 1332 11/26/18 0831  WBC 5.2 4.7 6.9  NEUTROABS 3.4  --  5.3  HGB 14.8 14.2 14.9  HCT 43.1 42.5 43.5  MCV 98.0 98.4 96.5  PLT 207 201 188   Cardiac Enzymes: No results for input(s): CKTOTAL, CKMB, CKMBINDEX, TROPONINI in the last 168 hours. BNP: Invalid input(s): POCBNP CBG: Recent Labs  Lab 11/26/18 0614 11/26/18 1138 11/26/18 1525 11/26/18 2125 11/27/18 0607  GLUCAP 112* 135* 112* 119* 123*   D-Dimer No results for input(s): DDIMER in the last 72 hours. Hgb A1c No results for input(s): HGBA1C in the last 72 hours. Lipid Profile No results for input(s): CHOL, HDL, LDLCALC, TRIG, CHOLHDL, LDLDIRECT in the last 72 hours. Thyroid function studies No results for input(s): TSH, T4TOTAL, T3FREE, THYROIDAB in the last 72 hours.  Invalid input(s): FREET3 Anemia work up No results for input(s): VITAMINB12, FOLATE, FERRITIN, TIBC, IRON, RETICCTPCT in the last 72 hours. Urinalysis    Component Value Date/Time   COLORURINE STRAW (A) 11/22/2018 0307   APPEARANCEUR CLEAR 11/22/2018 0307   LABSPEC 1.008 11/22/2018 0307   PHURINE 5.0 11/22/2018 0307   GLUCOSEU NEGATIVE 11/22/2018 0307   HGBUR NEGATIVE 11/22/2018 0307   BILIRUBINUR NEGATIVE 11/22/2018 0307   BILIRUBINUR n 03/10/2012 0858   KETONESUR NEGATIVE 11/22/2018 0307   PROTEINUR NEGATIVE  11/22/2018 0307   UROBILINOGEN 1.0 05/04/2014 1123   NITRITE NEGATIVE 11/22/2018 0307   LEUKOCYTESUR NEGATIVE 11/22/2018 0307   Sepsis Labs Invalid input(s): PROCALCITONIN,  WBC,  LACTICIDVEN Microbiology Recent Results (from the past 240 hour(s))  SARS CORONAVIRUS 2 Nasal Swab Aptima Multi Swab     Status: None   Collection Time: 11/22/18  4:58 AM   Specimen: Aptima Multi Swab; Nasal Swab  Result Value Ref Range Status   SARS Coronavirus 2 NEGATIVE NEGATIVE Final    Comment: (NOTE) SARS-CoV-2 target nucleic acids are NOT DETECTED. The SARS-CoV-2 RNA is generally detectable in upper and lower respiratory specimens during the acute phase of infection. Negative results do not preclude SARS-CoV-2 infection, do not rule out co-infections with other pathogens, and should not be used as the sole basis for treatment or other patient management decisions. Negative results must be combined with clinical observations, patient history, and epidemiological information. The expected result is Negative. Fact Sheet for Patients: SugarRoll.be Fact Sheet for Healthcare Providers: https://www.woods-mathews.com/ This test is not yet approved or cleared by the [place] FDA and  has been authorized for detection and/or diagnosis of SARS-CoV-2 by FDA under an Emergency Use Authorization (EUA). This EUA will remain  in effect (meaning this test can be used) for the duration of the COVID-19 declaration under Section 56 4(b)(1) of the Act, 21 U.S.C. section 360bbb-3(b)(1), unless the authorization  is terminated or revoked sooner. Performed at [facility], [place] [address] [place], [place] [address]     Please note: You were cared for by a hospitalist during your hospital stay. Once you are discharged, your primary care physician will handle any further medical issues. Please note that NO REFILLS for any discharge medications will be authorized once  you are discharged, as it is imperative that you return to your primary care physician (or establish a relationship with a primary care physician if you do not have one) for your post hospital discharge needs so that they can reassess your need for medications and monitor your lab values.    Time coordinating discharge: 40 minutes  SIGNED:   Shelly Coss, MD  [facility] 11/27/2018, 11:35 AM Pager [phone]  If 7PM-7AM, please contact night-coverage [email or web address] Password [email or web address]

## 2018-11-27 NOTE — TOC Transition Note (Signed)
Transition of Care [facility]) - CM/SW Discharge Note   Patient Details  Name: Ronald [name] MRN: [id number] Date of Birth: 1945-11-19  Transition of Care [facility]) CM/SW Contact:  Pollie Friar, RN Phone Number: 11/27/2018, 2:48 PM   Clinical Narrative:    Pt discharging to CIR today. CM signing off.    Final next level of care: IP Rehab Facility Barriers to Discharge: No Barriers Identified   Patient Goals and CMS Choice        Discharge Placement                       Discharge Plan and Services                                     Social Determinants of Health (SDOH) Interventions     Readmission Risk Interventions No flowsheet data found.

## 2018-11-27 NOTE — Significant Event (Signed)
Hypoglycemic Event  CBG: 59  Treatment: 8 oz juice/soda  Symptoms: None  Follow-up CBG: Time: 2240 CBG Result:133  Possible Reasons for Event: Unknown  Comments/MD notified: no     Larina Earthly

## 2018-11-27 NOTE — Progress Notes (Addendum)
NEUROLOGY PROGRESS NOTE  Subjective: Wife still states that he is confused.  She does admit that it is more at night.  Today I found out that at the nighttime he has been seeing spiders on the wall.  Patient is complaining of low back pain.  Exam: Vitals:   11/27/18 0804 11/27/18 0830  BP: (!) 149/93   Pulse: 77   Resp: 16 16  Temp: 98.2 F (36.8 C)   SpO2: 94%     Physical Exam   HEENT-  Normocephalic, no lesions, without obvious abnormality.  Normal external eye and conjunctiva.   Musculoskeletal-no joint tenderness, deformity or swelling Skin-warm and dry, no hyperpigmentation, vitiligo, or suspicious lesions    Neuro:  Mental Status: Patient is alert, he knows he is at [facility] but mentioned rehab-he is to go to rehab today.  He is able to name the president, the [place] to where the president lives, follow commands, has difficulty with three-step commands but able to follow 1-2 step commands.  He does know that it is summer, August, and the year is 2020. Cranial Nerves: II:  Visual fields grossly normal,  III,IV, VI: ptosis not present, extra-ocular motions intact bilaterally pupils equal, round, reactive to light and accommodation V,VII: smile symmetric, facial light touch sensation normal bilaterally VIII: hearing normal bilaterally IX,X: Palate rises midline XI: bilateral shoulder shrug XII: midline tongue extension Motor: Right : Upper extremity   5/5    Left:     Upper extremity   5/5  Lower extremity   5/5     Lower extremity   5/5 Tone and bulk:normal tone throughout; no atrophy noted Sensory: Pinprick and light touch intact throughout, bilaterally   Medications:  Scheduled: . amiodarone  200 mg Oral Daily  . amLODipine  10 mg Oral Daily  . atorvastatin  10 mg Oral QHS  . carvedilol  12.5 mg Oral BID WC  . divalproex  500 mg Oral Q12H  . insulin aspart  0-5 Units Subcutaneous QHS  . insulin aspart  0-9 Units Subcutaneous TID WC  . losartan  25 mg Oral  Daily  . mometasone-formoterol  2 puff Inhalation BID  . potassium chloride  40 mEq Oral Q4H  . QUEtiapine  25 mg Oral QHS  . sodium chloride flush  3 mL Intravenous Once  . sodium chloride flush  3 mL Intravenous Q12H   Continuous:   Pertinent Labs/Diagnostics: None     Etta Quill PA-C [facility] [phone]   Assessment: Patient with recent right frontal hemorrhage presenting with seizures.  Initially patient was on Keppra but this was switched to Depakote secondary to possibility that side effect of Keppra was causing confusion.  Patient remains on Depakote with no issues.  Wife still states that he is having waxing and waning confusion however it is worse at night.  He is now noting spiders on the walls and is aware that they are not there when wife explains they are not there.  She does admit that is worse at night.  Suspect altered mental status that worsens at night is likely delirium in the setting of left frontal hemorrhage.  Delirium Acute Encephalopathy    Recommendations: -Patient is to go to inpatient rehab today - Continue Depakote 500 mg twice daily - Continue Seroquel now dosed at 1800 hrs. - As stated prior no driving for 6 months.    11/27/2018, 10:20 AM   NEUROHOSPITALIST ADDENDUM Performed a face to face diagnostic evaluation.   I have reviewed  the contents of history and physical exam as documented by PA/ARNP/Resident and agree with above documentation.  I have discussed and formulated the above plan as documented. Edits to the note have been made as needed.   Hospital-acquired delirium, patient appears to be sundowning.  Seroquel was given late last night at 10 PM, recommend dose to be given at 6 PM.  Continue Depakote.  Patient will be going to rehab.     Karena Addison  MD [facility] [phone]   If 7pm to 7am, please call on call as listed on AMION.

## 2018-11-28 ENCOUNTER — Inpatient Hospital Stay (HOSPITAL_COMMUNITY): Payer: Federal, State, Local not specified - PPO | Admitting: Occupational Therapy

## 2018-11-28 ENCOUNTER — Other Ambulatory Visit: Payer: Self-pay

## 2018-11-28 ENCOUNTER — Inpatient Hospital Stay (HOSPITAL_COMMUNITY): Payer: Federal, State, Local not specified - PPO | Admitting: Physical Therapy

## 2018-11-28 ENCOUNTER — Inpatient Hospital Stay (HOSPITAL_COMMUNITY): Payer: Federal, State, Local not specified - PPO | Admitting: Speech Pathology

## 2018-11-28 DIAGNOSIS — E1169 Type 2 diabetes mellitus with other specified complication: Secondary | ICD-10-CM

## 2018-11-28 DIAGNOSIS — M109 Gout, unspecified: Secondary | ICD-10-CM

## 2018-11-28 DIAGNOSIS — E669 Obesity, unspecified: Secondary | ICD-10-CM

## 2018-11-28 LAB — CBC WITH DIFFERENTIAL/PLATELET
Abs Immature Granulocytes: 0.1 10*3/uL — ABNORMAL HIGH (ref 0.00–0.07)
Basophils Absolute: 0 10*3/uL (ref 0.0–0.1)
Basophils Relative: 0 %
Eosinophils Absolute: 0 10*3/uL (ref 0.0–0.5)
Eosinophils Relative: 0 %
HCT: 45.1 % (ref 39.0–52.0)
Hemoglobin: 15.2 g/dL (ref 13.0–17.0)
Immature Granulocytes: 1 %
Lymphocytes Relative: 9 %
Lymphs Abs: 0.8 10*3/uL (ref 0.7–4.0)
MCH: 33.3 pg (ref 26.0–34.0)
MCHC: 33.7 g/dL (ref 30.0–36.0)
MCV: 98.7 fL (ref 80.0–100.0)
Monocytes Absolute: 0.8 10*3/uL (ref 0.1–1.0)
Monocytes Relative: 10 %
Neutro Abs: 6.9 10*3/uL (ref 1.7–7.7)
Neutrophils Relative %: 80 %
Platelets: 203 10*3/uL (ref 150–400)
RBC: 4.57 MIL/uL (ref 4.22–5.81)
RDW: 12.1 % (ref 11.5–15.5)
WBC: 8.6 10*3/uL (ref 4.0–10.5)
nRBC: 0 % (ref 0.0–0.2)

## 2018-11-28 LAB — COMPREHENSIVE METABOLIC PANEL
ALT: 16 U/L (ref 0–44)
AST: 13 U/L — ABNORMAL LOW (ref 15–41)
Albumin: 3.5 g/dL (ref 3.5–5.0)
Alkaline Phosphatase: 80 U/L (ref 38–126)
Anion gap: 13 (ref 5–15)
BUN: 30 mg/dL — ABNORMAL HIGH (ref 8–23)
CO2: 24 mmol/L (ref 22–32)
Calcium: 9.1 mg/dL (ref 8.9–10.3)
Chloride: 100 mmol/L (ref 98–111)
Creatinine, Ser: 1.65 mg/dL — ABNORMAL HIGH (ref 0.61–1.24)
GFR calc Af Amer: 47 mL/min — ABNORMAL LOW (ref >60)
GFR calc non Af Amer: 41 mL/min — ABNORMAL LOW (ref >60)
Glucose, Bld: 131 mg/dL — ABNORMAL HIGH (ref 70–99)
Potassium: 3.4 mmol/L — ABNORMAL LOW (ref 3.5–5.1)
Sodium: 137 mmol/L (ref 135–145)
Total Bilirubin: 1.5 mg/dL — ABNORMAL HIGH (ref 0.3–1.2)
Total Protein: 6.7 g/dL (ref 6.5–8.1)

## 2018-11-28 LAB — GLUCOSE, CAPILLARY
Glucose-Capillary: 59 mg/dL — ABNORMAL LOW (ref 70–99)
Glucose-Capillary: 133 mg/dL — ABNORMAL HIGH (ref 70–99)
Glucose-Capillary: 114 mg/dL — ABNORMAL HIGH (ref 70–99)
Glucose-Capillary: 113 mg/dL — ABNORMAL HIGH (ref 70–99)
Glucose-Capillary: 139 mg/dL — ABNORMAL HIGH (ref 70–99)

## 2018-11-28 MED ORDER — QUETIAPINE FUMARATE 25 MG PO TABS
25.0000 mg | ORAL_TABLET | Freq: Every day | ORAL | Status: DC
  Administered 2018-11-28 – 2018-12-12 (×15): 25 mg via ORAL
  Filled 2018-11-28 (×15): qty 1

## 2018-11-28 MED ORDER — POTASSIUM CHLORIDE CRYS ER 20 MEQ PO TBCR
20.0000 meq | EXTENDED_RELEASE_TABLET | Freq: Every day | ORAL | Status: DC
  Administered 2018-11-28 – 2018-12-10 (×13): 20 meq via ORAL
  Filled 2018-11-28 (×14): qty 1

## 2018-11-28 MED ORDER — PREDNISONE 5 MG PO TABS
10.0000 mg | ORAL_TABLET | Freq: Two times a day (BID) | ORAL | Status: AC
  Administered 2018-11-28 – 2018-11-30 (×5): 10 mg via ORAL
  Filled 2018-11-28 (×5): qty 2

## 2018-11-28 NOTE — Progress Notes (Signed)
Inpatient Rehabilitation  Patient information reviewed and entered into eRehab system by Loretta Doutt M. Karole Oo, M.A., CCC/SLP, PPS Coordinator.  Information including medical coding, functional ability and quality indicators will be reviewed and updated through discharge.    

## 2018-11-28 NOTE — Progress Notes (Signed)
Social Work Assessment and Plan   Patient Details  Name: Ronald [name] MRN: [id number] Date of Birth: 22-Jan-1946  Today's Date: 11/28/2018  Problem List:  Patient Active Problem List   Diagnosis Date Noted  . AMS (altered mental status) 11/23/2018  . Encephalopathy 11/22/2018  . CKD (chronic kidney disease), stage III ([place]) 11/22/2018  . Thoracic ascending aortic aneurysm ([place]) 11/22/2018  . Seizures ([place]), secondsry to [facility] 10/23/2018  . Coagulopathy ([place]), Eliquis 10/23/2018  . Cerebral edema ([place]) 10/23/2018  . Hypertensive emergency 10/23/2018  . Advanced age 98/16/2020  . ICH (intracerebral hemorrhage) ([place]) w/ SAH while on Eliquis 10/21/2018  . Hepatic steatosis 04/22/2018  . Actinic keratoses 06/07/2017  . BPH (benign prostatic hyperplasia) 05/30/2017  . 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 Preop cardiovascular exam 03/07/2017  . Hypervolemia   . Chronic diastolic CHF (congestive heart failure) ([place])   . Hypoxia 02/21/2017  . ARF (acute renal failure) ([place])   . Hypomagnesemia 02/20/2017  . GI bleed 02/20/2017  . History of colonic polyps 07/03/2016  . Chronic anticoagulation 07/03/2016  . Hyperglycemia, drug-induced 04/05/2015  . 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 On amiodarone therapy 12/08/2014  . Edema of both legs 12/08/2014  . Exertional dyspnea 08/18/2014  . Hypokalemia   . Tobacco abuse   . Paroxysmal atrial fibrillation ([place]) 05/04/2014  . Cigarette smoker two packs a day or less   . Prostate cancer ([place]) 10/15/2013  . Obesity (BMI 30-39.9) 04/17/2013  . Umbilical hernia [id number]  . Right inguinal hernia 06/23/2012  . Hydrocele 06/19/2012  . Metabolic syndrome [id number]  . Type 2 diabetes mellitus with hyperglycemia ([place]) 03/20/2012  . Elevated PSA 03/20/2012  . GOUT, UNSPECIFIED 10/07/2009  . TINEA VERSICOLOR 07/19/2009  . PERS HX TOBACCO USE PRESENTING HAZARDS HEALTH  07/19/2009  . Obstructive sleep apnea 07/02/2008  . Hyperlipidemia with target LDL less than 70 07/01/2008  . Essential hypertension 07/01/2008  . ALLERGIC RHINITIS 07/01/2008   Past Medical History:  Past Medical History:  Diagnosis Date  . Allergic rhinitis   . Anticoagulant long-term use    eliquis  . 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 COPD with emphysema [facility])    pulmologist-  dr Halford Chessman  . Dyspnea    occasional per pt  . History of colon polyps    tubular adenoma 2013  . History of gout    09-05-2017 last flare-up  05/ 2019 3 wks ago, feet  . History of sepsis 02/18/2017   per d/c note probable uti, acute chf, acute renal failure, hypoxia  . Hyperlipidemia   . Hyperplasia of prostate with lower urinary tract symptoms (LUTS)   . Hypertension   . OSA on CPAP    per study 08-03-2004  Severe OSA  . Persistent atrial fibrillation    cardiologist --  dr Dorris Carnes--  post cardioversion 05-07-2014  . Prostate cancer [facility]) urologsit-  dr Alyson Ingles--- as of 05-21-2017 per pt last PSA 11 approx.   Dx  2014--  stage T1c, Gleason 3+3=6, PSA 6.67--  Active surveillance/  04/ 2019  Stage T1b, Gleason 3+4, PSA 12.8- plan external radiation therapy    . 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 Seasonal allergies   . Sigmoid diverticulosis   . Systolic and diastolic CHF, chronic [facility])    cardiologist-  [name]  . Tinea versicolor   .  Type 2 diabetes mellitus ([place])   . Wears hearing aid in both ears    Past Surgical History:  Past Surgical History:  Procedure Laterality Date  . CARDIOVERSION N/A 05/07/2014   Procedure: CARDIOVERSION;  Surgeon: [name], MD;  Location: [facility] ENDOSCOPY;  Service: Cardiovascular;  Laterality: N/A;  . CARDIOVERSION N/A 09/09/2014   Procedure: CARDIOVERSION;  Surgeon: Lelon Perla, MD;  Location: [facility] ENDOSCOPY;  Service: Cardiovascular;  Laterality: N/A;   successfully  . CATARACT EXTRACTION W/ INTRAOCULAR LENS  IMPLANT, BILATERAL  08 and 09/  2018  . COLONOSCOPY  last one 06-07-2011  . GOLD SEED IMPLANT N/A 09/09/2017   Procedure: GOLD SEED IMPLANT;  Surgeon: [name], MD;  Location: [facility];  Service: Urology;  Laterality: N/A;  . HYDROCELE EXCISION Bilateral 09/26/2015   Procedure: HYDROCELECTOMY ADULT;  Surgeon: [name], MD;  Location: [facility];  Service: Urology;  Laterality: Bilateral;  . LAMINECTOMY AND MICRODISCECTOMY LUMBAR SPINE  12-23-2003   Left L5 -- S1  . LAPAROSCOPIC BILATERAL INGUINAL HERNIA REPAIR/  UMBILICAL HERNIA REPAIR WITH MESH/  ASPIRATION LEFT HYDROCELE  07-11-2012  . NM MYOVIEW LTD  05/18/2014   Low risk study. Normal perfusion: No ischemia or infarction. Mild LV dysfunction - 46% (does not correlate with echocardiographic EF of 50-55%)  . PROSTATE BIOPSY  05/15/12   Clinically both Lobes  . SPACE OAR INSTILLATION N/A 09/09/2017   Procedure: SPACE OAR INSTILLATION;  Surgeon: [name], MD;  Location: [facility];  Service: Urology;  Laterality: N/A;  . TEE WITHOUT CARDIOVERSION N/A 05/07/2014   Procedure: TRANSESOPHAGEAL ECHOCARDIOGRAM (TEE);  Surgeon: [name], MD;  Location: [facility] ENDOSCOPY;  Service: Cardiovascular;  Laterality: N/A;   mild atherosclerosis plaque of aorta,  mild AR, MR, and TR,  no cardiac source of emboli  . TONSILLECTOMY  as child  . TRANSTHORACIC ECHOCARDIOGRAM  11/'18; 1/'19   a) In setting of sepsis: EF of 40-45%.  Diffuse hypokinesis.  No RWMA.  Biatrial enlargement.;; b) ** f/u Jan 2019: Normal LVF 55-60%** , mildly dilated aortic root(38 mm) and ascending aorta (44 mm). Compared to prior echo, LVEF has improved.  . TRANSURETHRAL RESECTION OF PROSTATE N/A 05/30/2017   Procedure: TRANSURETHRAL RESECTION OF THE PROSTATE (TURP);  Surgeon: [name], MD;  Location: [facility];  Service: Urology;  Laterality: N/A;    Social History:  reports that he has been smoking cigarettes. He has a 15.00 pack-year smoking history. He has never used smokeless tobacco. He reports current alcohol use of about 14.0 standard drinks of alcohol per week. He reports that he does not use drugs.  Family / Support Systems Marital Status: Married How Long?: ~ 30 yrs Patient Roles: Spouse, Parent Spouse/Significant Other: wife, Javario Foyt @ (C) [phone] Children: one adult daughter who does live close by Anticipated Caregiver: spouse Ability/Limitations of Caregiver: Min A Caregiver Availability: 24/7 Family Dynamics: Wife here daily and very supportive.  Very concerned about his decline since initial ICH and whether he will make improvement on CIR.  Social History Preferred language: English Religion: Non-Denominational Cultural Background: NA Read: Yes Write: Yes Employment Status: Retired Public relations account executive Issues: none Guardian/Conservator: None - per MD, pt is not capable of making decisions on his own behalf - defer to spouse   Abuse/Neglect Abuse/Neglect Assessment Can Be Completed: Unable to assess, patient is non-responsive or altered mental status Physical Abuse: Denies Verbal Abuse: Denies Sexual Abuse: Denies Exploitation of  patient/patient's resources: Denies Self-Neglect: Denies  Emotional Status Pt's affect, behavior and adjustment status: Pt sitting up in wheelchair and agreeable to interview.  He is oriented only to person with me.  States he is in "[place]".  Speech somewhat confused.  Unable to complete assessment interivew.  Does not appear in any emotional distress currently, however, will monitor and will plan to refer for neuropsychology consult to further assess and support. Recent Psychosocial Issues: recent [place] relatied to this admission Psychiatric History: None Substance Abuse History: None  Patient / Family Perceptions, Expectations & Goals Pt/Family understanding  of illness & functional limitations: Pt unable to verbalize why he is in hospital - "tests".  Wife gives good chronological report begin with his ICH in July and subsequent decline after he returned home.  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 Pt/family expectations/goals: Wife hopeful that he might make some gains on CIR but concerned.  Community [employer] Agencies: None Premorbid Home Care/DME Agencies: Other (Comment)(was attending OP tx at [facility]) Transportation available at discharge: yes Resource referrals recommended: Neuropsychology, Support group (specify)  Discharge Planning Living Arrangements: Spouse/significant other Support Systems: Spouse/significant other, Children Type of Residence: Private residence Financial Resources: Social Security Financial Screen Referred: No Living Expenses: Own Money Management: Patient Does the patient have any problems obtaining your medications?: No Home Management: Pt and wife Patient/Family Preliminary Plans: Pt to return home with wife who can provide 24/7 support. Expected length of stay: 12-14 days  Clinical Impression Unfortunate gentleman here for noted decline at home following a recent [place] (July).  Work up underway to determine cause (meds? Seizure?) and wife hopeful he will make good progress on CIR.  She notes that he was "doing great" when he went home in July but then steady decline.  Pt with notable confusion and orient to person only with this SW.  Wife very involved and here daily.  She is able to continue to provide 24/7 support, however, hopeful he will show improvement here.    Yuleimy Kretz 11/28/2018, 4:21  PM

## 2018-11-28 NOTE — IPOC Note (Signed)
Overall Plan of Care [facility]) Patient Details Name: Ronald [name] MRN: [id number] DOB: [birth year]  Admitting Diagnosis: Encephalopathy  Hospital Problems: Principal Problem:   Encephalopathy     Functional Problem List: Nursing Bladder, Pain, Safety  PT Balance, Behavior, Endurance, Motor, Pain, Safety, Sensory  OT Balance, Safety, Behavior, Cognition, Endurance, Motor, Pain  SLP Cognition, Endurance, Perception, Safety  TR         Basic ADL's: OT Grooming, Bathing, Dressing, Toileting     Advanced  ADL's: OT       Transfers: PT Bed Mobility, Bed to Chair, Car, Manufacturing systems engineer, Metallurgist: PT Stairs, Ambulation     Additional Impairments: OT None  SLP Social Cognition   Social Interaction, Problem Solving, Memory, Attention, Awareness  TR      Anticipated Outcomes Item Anticipated Outcome  Self Feeding Indep  Swallowing  n/a   Basic self-care  Supervision  Toileting  Supervision   Bathroom Transfers Supervision  Bowel/Bladder  will be continent of bladder with minimal assist  Transfers  supervision with LRAD  Locomotion  supervision with LRAD  Communication  n/a  Cognition  modA  Pain  Decreased pain in BLE's  Safety/Judgment  Increased safety awareness improvement of cognitive deficits   Therapy Plan: PT Intensity: Minimum of 1-2 x/day ,45 to 90 minutes PT Frequency: 5 out of 7 days PT Duration Estimated Length of Stay: 12-14 days OT Intensity: Minimum of 1-2 x/day, 45 to 90 minutes OT Frequency: 5 out of 7 days OT Duration/Estimated Length of Stay: 10-12 days SLP Intensity: Minumum of 1-2 x/day, 30 to 90 minutes SLP Frequency: 3 to 5 out of 7 days SLP Duration/Estimated Length of Stay: 12-14 days   Due to the current state of emergency, patients may not be receiving their 3-hours of Medicare-mandated therapy.   Team Interventions: Nursing Interventions Patient/Family Education, Bladder Management, Disease  Management/Prevention, Pain Management, Medication Management, Cognitive Remediation/Compensation, Discharge Planning, Psychosocial Support  PT interventions Ambulation/gait training, Community reintegration, DME/adaptive equipment instruction, Neuromuscular re-education, Psychosocial support, Stair training, UE/LE Strength taining/ROM, UE/LE Coordination activities, Therapeutic Activities, Wheelchair propulsion/positioning, Pain management, Functional electrical stimulation, Discharge planning, Training and development officer, Cognitive remediation/compensation, Disease management/prevention, Functional mobility training, Patient/family education, Therapeutic Exercise, Visual/perceptual remediation/compensation  OT Interventions Balance/vestibular training, Discharge planning, Pain management, Self Care/advanced ADL retraining, Therapeutic Activities, UE/LE Coordination activities, Therapeutic Exercise, Patient/family education, Functional mobility training, Cognitive remediation/compensation, Community reintegration, Engineer, drilling, Psychosocial support, UE/LE Strength taining/ROM  SLP Interventions Cognitive remediation/compensation, Cueing hierarchy, Functional tasks, Environmental controls, Multimodal communication approach, Speech/Language facilitation, Therapeutic Activities, Internal/external aids, Patient/family education, Medication managment  TR Interventions    SW/CM Interventions Discharge Planning, Psychosocial Support, Patient/Family Education   Barriers to Discharge MD  Medical stability and Behavior  Nursing Incontinence    PT      OT      SLP      SW       Team Discharge Planning: Destination: PT-Home ,OT- Home , SLP-Home Projected Follow-up: PT-24 hour supervision/assistance, Home health PT, OT-  Home health OT, SLP-Other (comment)(TBD) Projected Equipment Needs: PT-To be determined, OT- To be determined, SLP-None recommended by SLP Equipment Details: PT- ,  OT-  Patient/family involved in discharge planning: PT- Family member/caregiver,  OT-Patient, SLP-Patient, Family member/caregiver  MD ELOS: 10-14 days Medical Rehab Prognosis:  Good Assessment: The patient has been admitted for CIR therapies with the diagnosis of encephalopathy, recent seizures. The team will be addressing functional mobility, strength, stamina, balance, safety, adaptive  techniques and equipment, self-care, bowel and bladder mgt, patient and caregiver education, NMR, cognitive perceptual rx, ego support. Goals have been set at supervision for self-care and mobility and supervision for cognition.   Due to the current state of emergency, patients may not be receiving their 3 hours per day of Medicare-mandated therapy.    Meredith Staggers, MD, FAAPMR      See Team Conference Notes for weekly updates to the plan of care

## 2018-11-28 NOTE — Progress Notes (Addendum)
[facility] PHYSICAL MEDICINE & REHABILITATION PROGRESS NOTE   Subjective/Complaints: Up in chair with OT. Feet still hurt, esp left.   ROS: Limited due to cognitive/behavioral    Objective:   No results found. Recent Labs    11/26/18 0831 11/28/18 0720  WBC 6.9 8.6  HGB 14.9 15.2  HCT 43.5 45.1  PLT 188 203   Recent Labs    11/27/18 0342 11/28/18 0720  NA 137 137  K 3.1* 3.4*  CL 104 100  CO2 23 24  GLUCOSE 119* 131*  BUN 19 30*  CREATININE 1.32* 1.65*  CALCIUM 8.4* 9.1    Intake/Output Summary (Last 24 hours) at 11/28/2018 1030 Last data filed at 11/28/2018 0626 Gross per 24 hour  Intake -  Output 261 ml  Net -261 ml     Physical Exam: Vital Signs Blood pressure 133/76, pulse 75, temperature 98.5 F (36.9 C), temperature source Oral, resp. rate 17, height 5\' 11"  (1.803 m), weight 99.9 kg, SpO2 96 %. Constitutional: No distress . Vital signs reviewed. HEENT: EOMI, oral membranes moist Neck: supple Cardiovascular: RRR without murmur. No JVD    Respiratory: CTA Bilaterally without wheezes or rales. Normal effort    GI: BS +, non-tender, protuberant Musculoskeletal:  Comments: Pt with tender midfoot and ankles especially left lateral. No obvious warmth or swellingstill on exam Neurological: He is alert. Oriented to self. Did recall his wife/family, where he lives. Follows all simple commands. Remains distracted.  Moves all 4 limbs but still a little slower moving feet d/t pain Skin: Skin iswarmand dry.  Psychiatric: Pleasantly confused    Assessment/Plan: 1. Functional deficits secondary to recent right frontal ICH/seizures and resulting encephalopathy which require 3+ hours per day of interdisciplinary therapy in a comprehensive inpatient rehab setting.  Physiatrist is providing close team supervision and 24 hour management of active medical problems listed below.  Physiatrist and rehab team continue to assess barriers to discharge/monitor  patient progress toward functional and medical goals  Care Tool:  Bathing              Bathing assist       Upper Body Dressing/Undressing Upper body dressing        Upper body assist      Lower Body Dressing/Undressing Lower body dressing            Lower body assist       Toileting Toileting    Toileting assist       Transfers Chair/bed transfer  Transfers assist           Locomotion Ambulation   Ambulation assist              Walk 10 feet activity   Assist           Walk 50 feet activity   Assist           Walk 150 feet activity   Assist           Walk 10 feet on uneven surface  activity   Assist           Wheelchair     Assist               Wheelchair 50 feet with 2 turns activity    Assist            Wheelchair 150 feet activity     Assist          Blood pressure 133/76, pulse 75, temperature 98.5  F (36.9 C), temperature source Oral, resp. rate 17, height 5\' 11"  (1.803 m), weight 99.9 kg, SpO2 96 %.  Medical Problem List and Plan: 1.Decreased functional ability with altered mental statussecondary to recent right frontal ICH with seizureand associated encephalopathy --Patient is beginning CIR therapies today including PT, OT, and SLP  2. Antithrombotics: -DVT/anticoagulation:SCD.Eliquis on hold due to [place]. -antiplatelet therapy: N/A 3. Pain Management:Hydrocodone 1 tablet every 6 hours as needed moderate pain  -brief prednisone taper for gout flare in feet 4. Mood:Provide emotional support -antipsychotic agents: Seroquel 25 mg nightlywith backup dose -continue sleep chart 5. Neuropsych: This patientis notcapable of making decisions on hisown behalf. 6. Skin/Wound Care:Routine skin checks 7. Fluids/Electrolytes/Nutrition:encourage PO  -push fluids  -replete k+ 8. Atrial  fibrillation. No longer on anticoagulation due to [place]. Continue amiodarone 200 mg daily. Follow-up cardiology service Dr. Harrington Challenger. HR controlled in 70's currently 9. Seizure disorder. EEG negative. Keppra changed to Depakote500 mg every 12 hours 10. Chronic diastolic congestive heart failure.Coreg 12.5 mg twice daily, Cozaar 25 mg daily. Monitor for any signs of fluid overload -check daily weights   Filed Weights   11/27/18 2049 11/28/18 0627  Weight: 100.2 kg 99.9 kg    11. CKD stage III. Creatinine baseline 1.62.  Cr 1.65 today 12. COPD/CPAP. Follow-up Dr. Halford Chessman. Continue Dulera  -IS/OOB 13. Type 2 diabetes mellitus. Hemoglobin A1c 6.9. SSI. Check blood sugars before meals and at bedtime. Patient on Glucophage 1000 mg twice daily prior to admission.   -cbg's controlled at present -resume glucophage as needed 14. History of ascending thoracic aortic aneurysm. Follow-up as outpatient 15. Hyperlipidemia. Lipitor    LOS: 1 days A FACE TO FACE EVALUATION WAS PERFORMED  Meredith Staggers 11/28/2018, 10:30 AM

## 2018-11-28 NOTE — Plan of Care (Signed)
Needs timed toileting for urinary incontinence Pain medications as directed

## 2018-11-28 NOTE — Evaluation (Signed)
Speech Language Pathology Assessment and Plan  Patient Details  Name: Ronald [name] MRN: [id number] Date of Birth: Feb 02, 1946  SLP Diagnosis: Cognitive Impairments  Rehab Potential: Fair ELOS: 12-14 days  Today's Date: 11/28/2018 SLP Individual Time: 1856-3149 and 1330-1420 SLP Individual Time Calculation (min): 20 min and 50 min   Problem List:  Patient Active Problem List   Diagnosis Date Noted  . AMS (altered mental status) 11/23/2018  . Encephalopathy 11/22/2018  . CKD (chronic kidney disease), stage III ([place]) 11/22/2018  . Thoracic ascending aortic aneurysm ([place]) 11/22/2018  . Seizures ([place]), secondsry to [facility] 10/23/2018  . Coagulopathy ([place]), Eliquis 10/23/2018  . Cerebral edema ([place]) 10/23/2018  . Hypertensive emergency 10/23/2018  . Advanced age 49/16/2020  . ICH (intracerebral hemorrhage) ([place]) w/ SAH while on Eliquis 10/21/2018  . Hepatic steatosis 04/22/2018  . Actinic keratoses 06/07/2017  . BPH (benign prostatic hyperplasia) 05/30/2017  . Cardiomyopathy due to systemic disease ([place]) 03/09/2017  . Preop cardiovascular exam 03/07/2017  . Hypervolemia   . Chronic diastolic CHF (congestive heart failure) ([place])   . Hypoxia 02/21/2017  . ARF (acute renal failure) ([place])   . Hypomagnesemia 02/20/2017  . GI bleed 02/20/2017  . History of colonic polyps 07/03/2016  . Chronic anticoagulation 07/03/2016  . Hyperglycemia, drug-induced 04/05/2015  . 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 ([place]) 02/21/2015  . On amiodarone therapy 12/08/2014  . Edema of both legs 12/08/2014  . Exertional dyspnea 08/18/2014  . Hypokalemia   . Tobacco abuse   . Paroxysmal atrial fibrillation ([place]) 05/04/2014  . Cigarette smoker two packs a day or less   . Prostate cancer ([place]) 10/15/2013  . Obesity (BMI 30-39.9) 04/17/2013  . Umbilical hernia 70/26/3785  . Right inguinal hernia 06/23/2012  . Hydrocele 06/19/2012  . Metabolic syndrome 88/50/2774  . Type 2  diabetes mellitus with hyperglycemia ([place]) 03/20/2012  . Elevated PSA 03/20/2012  . GOUT, UNSPECIFIED 10/07/2009  . TINEA VERSICOLOR 07/19/2009  . PERS HX TOBACCO USE PRESENTING HAZARDS HEALTH 07/19/2009  . Obstructive sleep apnea 07/02/2008  . Hyperlipidemia with target LDL less than 70 07/01/2008  . Essential hypertension 07/01/2008  . ALLERGIC RHINITIS 07/01/2008   Past Medical History:  Past Medical History:  Diagnosis Date  . Allergic rhinitis   . Anticoagulant long-term use    eliquis  . Cardiomyopathy due to systemic disease [facility])    followed by dr harding  . COPD with emphysema [facility])    pulmologist-  dr Halford Chessman  . Dyspnea    occasional per pt  . History of colon polyps    tubular adenoma 2013  . History of gout    09-05-2017 last flare-up  05/ 2019 3 wks ago, feet  . History of sepsis 02/18/2017   per d/c note probable uti, acute chf, acute renal failure, hypoxia  . Hyperlipidemia   . Hyperplasia of prostate with lower urinary tract symptoms (LUTS)   . Hypertension   . OSA on CPAP    per study 08-03-2004  Severe OSA  . Persistent atrial fibrillation    cardiologist --  dr Dorris Carnes--  post cardioversion 05-07-2014  . Prostate cancer [facility]) urologsit-  dr Alyson Ingles--- as of 05-21-2017 per pt last PSA 11 approx.   Dx  2014--  stage T1c, Gleason 3+3=6, PSA 6.67--  Active surveillance/  04/ 2019  Stage T1b, Gleason 3+4, PSA 12.8- plan external radiation therapy    . 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 ([place])    pulmologist-  dr Halford Chessman  .  Seasonal allergies   . Sigmoid diverticulosis   . Systolic and diastolic CHF, chronic [facility])    cardiologist-  [name]  . Tinea versicolor   . Type 2 diabetes mellitus ([place])   . Wears hearing aid in both ears    Past Surgical History:  Past Surgical History:  Procedure Laterality Date  . CARDIOVERSION N/A 05/07/2014   Procedure: CARDIOVERSION;  Surgeon: [name], MD;  Location[facility] END[facility]COPY;  Service:  Cardiovascular;  Laterality: N/A;  . CARDIOVERSION N/A 09/09/2014   Procedure: CARDIOVERSION;  Surgeon: Lelon Perla, MD;  Location: [facility] ENDOSCOPY;  Service: Cardiovascular;  Laterality: N/A;  successfully  . CATARACT EXTRACTION W/ INTRAOCULAR LENS  IMPLANT, BILATERAL  08 and 09/  2018  . COLONOSCOPY  last one 06-07-2011  . GOLD SEED IMPLANT N/A 09/09/2017   Procedure: GOLD SEED IMPLANT;  Surgeon: [name], MD;  [facility]  Service: Urology;  Laterality: N/A;  . HYDROCELE EXCISION Bilateral 09/26/2015   Procedure: HYDROCELECTOMY ADULT;  Surgeon: [name], MD;  Location: [facility];  Service: Urology;  Laterality: Bilateral;  . LAMINECTOMY AND MICRODISCECTOMY LUMBAR SPINE  12-23-2003   Left L5 -- S1  . LAPAROSCOPIC BILATERAL INGUINAL HERNIA REPAIR/  UMBILICAL HERNIA REPAIR WITH MESH/  ASPIRATION LEFT HYDROCELE  07-11-2012  . NM MYOVIEW LTD  05/18/2014   Low risk study. Normal perfusion: No ischemia or infarction. Mild LV dysfunction - 46% (does not correlate with echocardiographic EF of 50-55%)  . PROSTATE BIOPSY  05/15/12   Clinically both [place]  . SPACE OAR INSTILLATION N/A 09/09/2017   Procedure: SPACE OAR INSTILLATION;  Surgeon: [name], MD;  Location[facility];  Service: Urology;  Laterality: N/A;  . TEE WITHOUT CARDIOVERSION N/A 05/07/2014   Procedure: TRANSESOPHAGEAL ECHOCARDIOGRAM (TEE);  Surgeon: [name], MD;  Location: [facility] ENDOSCOPY;  Service: Cardiovascular;  Laterality: N/A;   mild atherosclerosis plaque of aorta,  mild AR, MR, and TR,  no cardiac source of emboli  . TONSILLECTOMY  as child  . TRANSTHORACIC ECHOCARDIOGRAM  11/'18; 1/'19   a) In setting of sepsis: EF of 40-45%.  Diffuse hypokinesis.  No RWMA.  Biatrial enlargement.;; b) ** f/u Jan 2019: Normal LVF 55-60%** , mildly dilated aortic root(38 mm) and ascending aorta (44 mm). Compared to prior echo, LVEF has improved.  .  TRANSURETHRAL RESECTION OF PROSTATE N/A 05/30/2017   Procedure: TRANSURETHRAL RESECTION OF THE PROSTATE (TURP);  Surgeon: [name], MD;  Location: [facility];  Service: Urology;  Laterality: N/A;    Assessment / Plan / Recommendation Clinical Impression 73 year old male admitted to acute care 11/21/2018 with AMS and gait instability, with leaning to the right. PMH: R ICH (july 2020), HTN, CHD, AFib, ascending thoracic aortic aneurysm, COPD, CPAP, CKD3, DM2, seizures. Head CT and MRI both negative for acute infarct. (of note, pt was seen 11/10/2018 for outpatient speech therapy evaluation. No therapy recommended at that time, as pt performance was at baseline, within functional limits)  Bedside Swallow Evaluation: Pt was seen for clinical swallow evaluation during lunch. CN exam unremarkable. Pt has adequate natural dentition. Pt was observed self feeding regular solid textures and thin liquids via straw. No obvious oral deficits noted. No overt s/s aspiration observed on any consistency. Pt was able to self feed without assistance. Will continue regular consistency solids and thin liquids. Meds whole with water. No further ST intervention for dysphagia recommended at this time. Please  reconsult if needs arise.   Cognitive Linguistic Evaluation: The Cognistat was administered to assess pt cognitive and language function. Pt's speech is fully intelligible. Pt's verbal communication is characterized by language of confusion with frequent self-distraction noted. Pt presents with significant basic cognitive impairment, with poor focused and sustained attention to task, decreased orientation to place, time and situation, and reduced immediate and delayed recall. Higher levels of cognition are also severely impaired, including problem solving, verbal reasoning, thought organization, and executive functions. Cognitive deficits are exacerbated by poor hearing acuity. Pt's wife reports he has hearing aids at  home. Pt has a college level education, and prior to [place] in July was independent with managing personal investments and medication administration. Pt would benefit from skilled ST intervention during CIR stay to maximize cognitive function and minimize caregiver burden.   Skilled Therapeutic Interventions          Pt participated in bedside swallow evaluation and cognitive linguistic assessment.   SLP Assessment  Patient will need skilled Speech Language Pathology Services during CIR admission    Recommendations  SLP Diet Recommendations: Age appropriate regular solids;Thin Liquid Administration via: Cup;Straw Medication Administration: Whole meds with liquid Supervision: Patient able to self feed Compensations: Minimize environmental distractions;Slow rate;Small sips/bites Postural Changes and/or Swallow Maneuvers: Out of bed for meals;Seated upright 90 degrees Oral Care Recommendations: Oral care BID Recommendations for Other Services: Neuropsych consult;Therapeutic Recreation consult Therapeutic Recreation Interventions: Outing/community reintergration Patient destination: Home Follow up Recommendations: Other (comment)(TBD) Equipment Recommended: None recommended by SLP    SLP Frequency 3 to 5 out of 7 days   SLP Duration  SLP Intensity  SLP Treatment/Interventions 12-14 days  Minumum of 1-2 x/day, 30 to 90 minutes  Cognitive remediation/compensation;Cueing hierarchy;Functional tasks;Environmental controls;Multimodal communication approach;Speech/Language facilitation;Therapeutic Activities;Internal/external aids;Patient/family education;Medication managment    Pain Pain Assessment Pain Scale: 0-10 Pain Score: 0-No pain  Prior Functioning Cognitive/Linguistic Baseline: Within functional limits(prior to July [place]) Type of Home: House  Lives With: Spouse Available Help at Discharge: Family Education: Bachelor's  Vocation: Part time employment(sales)  Short Term  Goals: Week 1: SLP Short Term Goal 1 (Week 1): Pt will consistently demonstrate Ox4 with MaxA verbal and visual cues SLP Short Term Goal 2 (Week 1): Pt will attend to basic familiar tasks for 10 minute intervals with ModA verbal cues for redirection. SLP Short Term Goal 3 (Week 1): Pt will recall new daily information with ModA multimodal cues SLP Short Term Goal 4 (Week 1): Pt will demonstrate functional problem solving for basic and familiar tasks with ModA multimodal cues.  Refer to Care Plan for Long Term Goals  Recommendations for other services: Neuropsych, Therapeutic Recreation  Discharge Criteria: Patient will be discharged from SLP if patient refuses treatment 3 consecutive times without medical reason, if treatment goals not met, if there is a change in medical status, if patient makes no progress towards goals or if patient is discharged from hospital.  The above assessment, treatment plan, treatment alternatives and goals were discussed and mutually agreed upon: by patient and by family  Celia B. [name], [facility], CCC-SLP Speech Language Pathologist  Shonna Chock 11/28/2018, 2:59 PM

## 2018-11-28 NOTE — Evaluation (Signed)
Physical Therapy Assessment and Plan  Patient Details  Name: Ronald [name] MRN: [id number] Date of Birth: 1946-03-27  PT Diagnosis: Abnormality of gait, Cognitive deficits, Coordination disorder, Difficulty walking, Impaired cognition, Muscle weakness and Pain in B feet Rehab Potential: Good ELOS: 12-14 days   Today's Date: 11/28/2018 PT Individual Time: 0909-1020 PT Individual Time Calculation (min): 71 min    Problem List:  Patient Active Problem List   Diagnosis Date Noted  . AMS (altered mental status) 11/23/2018  . Encephalopathy 11/22/2018  . CKD (chronic kidney disease), stage III ([place]) 11/22/2018  . Thoracic ascending aortic aneurysm ([place]) 11/22/2018  . Seizures ([place]), secondsry to [facility] 10/23/2018  . Coagulopathy ([place]), Eliquis 10/23/2018  . Cerebral edema ([place]) 10/23/2018  . Hypertensive emergency 10/23/2018  . Advanced age 41/16/2020  . ICH (intracerebral hemorrhage) ([place]) w/ SAH while on Eliquis 10/21/2018  . Hepatic steatosis 04/22/2018  . Actinic keratoses 06/07/2017  . BPH (benign prostatic hyperplasia) 05/30/2017  . Cardiomyopathy due to systemic disease ([place]) 03/09/2017  . Preop cardiovascular exam 03/07/2017  . Hypervolemia   . Chronic diastolic CHF (congestive heart failure) ([place])   . Hypoxia 02/21/2017  . ARF (acute renal failure) ([place])   . Hypomagnesemia 02/20/2017  . GI bleed 02/20/2017  . History of colonic polyps 07/03/2016  . Chronic anticoagulation 07/03/2016  . Hyperglycemia, drug-induced 04/05/2015  . 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 On amiodarone therapy 12/08/2014  . Edema of both legs 12/08/2014  . Exertional dyspnea 08/18/2014  . Hypokalemia   . Tobacco abuse   . Paroxysmal atrial fibrillation ([place]) 05/04/2014  . Cigarette smoker two packs a day or less   . Prostate cancer ([place]) 10/15/2013  . Obesity (BMI 30-39.9) 04/17/2013  . Umbilical hernia 60/60/0459  . Right inguinal hernia  06/23/2012  . Hydrocele 06/19/2012  . Metabolic syndrome 97/74/1423  . Type 2 diabetes mellitus with hyperglycemia ([place]) 03/20/2012  . Elevated PSA 03/20/2012  . GOUT, UNSPECIFIED 10/07/2009  . TINEA VERSICOLOR 07/19/2009  . PERS HX TOBACCO USE PRESENTING HAZARDS HEALTH 07/19/2009  . Obstructive sleep apnea 07/02/2008  . Hyperlipidemia with target LDL less than 70 07/01/2008  . Essential hypertension 07/01/2008  . ALLERGIC RHINITIS 07/01/2008    Past Medical History:  Past Medical History:  Diagnosis Date  . Allergic rhinitis   . Anticoagulant long-term use    eliquis  . Cardiomyopathy due to systemic disease [facility])    followed by dr harding  . COPD with emphysema [facility])    pulmologist-  dr Halford Chessman  . Dyspnea    occasional per pt  . History of colon polyps    tubular adenoma 2013  . History of gout    09-05-2017 last flare-up  05/ 2019 3 wks ago, feet  . History of sepsis 02/18/2017   per d/c note probable uti, acute chf, acute renal failure, hypoxia  . Hyperlipidemia   . Hyperplasia of prostate with lower urinary tract symptoms (LUTS)   . Hypertension   . OSA on CPAP    per study 08-03-2004  Severe OSA  . Persistent atrial fibrillation    cardiologist --  dr Dorris Carnes--  post cardioversion 05-07-2014  . Prostate cancer [facility]) urologsit-  dr Alyson Ingles--- as of 05-21-2017 per pt last PSA 11 approx.   Dx  2014--  stage T1c, Gleason 3+3=6, PSA 6.67--  Active surveillance/  04/ 2019  Stage T1b, Gleason 3+4, PSA 12.8- plan external radiation therapy    . Respiratory bronchiolitis associated  interstitial lung disease ([place])    pulmologist-  dr Halford Chessman  . Seasonal allergies   . Sigmoid diverticulosis   . Systolic and diastolic CHF, chronic [facility])    cardiologist-  [name]  . Tinea versicolor   . Type 2 diabetes mellitus ([place])   . Wears hearing aid in both ears    Past Surgical History:  Past Surgical History:  Procedure Laterality Date  . CARDIOVERSION N/A 05/07/2014    Procedure: CARDIOVERSION;  Surgeon: [name], MD;  Location: [facility] ENDOSCOPY;  Service: Cardiovascular;  Laterality: N/A;  . CARDIOVERSION N/A 09/09/2014   Procedure: CARDIOVERSION;  Surgeon: Lelon Perla, MD;  Location[facility] ENDOSCOPY;  Service: Cardiovascular;  Laterality: N/A;  successfully  . CATARACT EXTRACTION W/ INTRAOCULAR LENS  IMPLANT, BILATERAL  08 and 09/  2018  . COLONOSCOPY  last one 06-07-2011  . GOLD SEED IMPLANT N/A 09/09/2017   Procedure: GOLD SEED IMPLANT;  Surgeon: [name], MD;  Location: [facility];  Service: Urology;  Laterality: N/A;  . HYDROCELE EXCISION Bilateral 09/26/2015   Procedure: HYDROCELECTOMY ADULT;  Surgeon: [name], MD;  Location: [facility];  Service: Urology;  Laterality: Bilateral;  . LAMINECTOMY AND MICRODISCECTOMY LUMBAR SPINE  12-23-2003   Left L5 -- S1  . LAPAROSCOPIC BILATERAL INGUINAL HERNIA REPAIR/  UMBILICAL HERNIA REPAIR WITH MESH/  ASPIRATION LEFT HYDROCELE  07-11-2012  . NM MYOVIEW LTD  05/18/2014   Low risk study. Normal perfusion: No ischemia or infarction. Mild LV dysfunction - 46% (does not correlate with echocardiographic EF of 50-55%)  . PROSTATE BIOPSY  05/15/12   Clinically both [place]ob[place]  . SPACE OAR INSTILLATION N/A 09/09/2017   Procedure: SPACE OAR INSTILLATION;  Surgeon: [name], MD;  Location: [facility];  Service: Urology;  Laterality: N/A;  . TEE WITHOUT CARDIOVERSION N/A 05/07/2014   Procedure: TRANSESOPHAGEAL ECHOCARDIOGRAM (TEE);  Surgeon: [name], MD;  Location: [facility] ENDOSCOPY;  Service: Cardiovascular;  Laterality: N/A;   mild atherosclerosis plaque of aorta,  mild AR, MR, and TR,  no cardiac source of emboli  . TONSILLECTOMY  as child  . TRANSTHORACIC ECHOCARDIOGRAM  11/'18; 1/'19   a) In setting of sepsis: EF of 40-45%.  Diffuse hypokinesis.  No RWMA.  Biatrial enlargement.;; b) ** f/u Jan 2019: Normal LVF 55-60%** , mildly dilated aortic root(38 mm)  and ascending aorta (44 mm). Compared to prior echo, LVEF has improved.  . TRANSURETHRAL RESECTION OF PROSTATE N/A 05/30/2017   Procedure: TRANSURETHRAL RESECTION OF THE PROSTATE (TURP);  Surgeon: [name], MD;  Location: [facility] ORS;  Service: Urology;  Laterality: N/A;    Assessment & Plan Clinical Impression: Patient is a 73 y.o. year old male with history significant for right frontal ICH related to Eliquis and admitted 10/21/2018 until 10/27/2018 and discharged to home ambulating 240 feet without assistive device, atrial fibrillationfollowed by Dr. Dorris Carnes andno longer on anticoagulation, hypertension, chronic diastolic congestive heart failure,history of ascending thoracic aortic aneurysm followed as an outpatient.,COPDmaintained on CPAP and followed by Dr. Halford Chessman, chronic kidney disease stage IIIwith creatinine 1.62, type 2 diabetes mellitus and seizuremaintained on Keppra 500 mg twice daily. Per chart review patient lives with spouse. Two-level home with bed and bathroom on Main level. Reportedly independent prior to admission. Presented 11/22/2018 with increased altered mental status and poor balance leaning to the right side. Urinalysis negative, COVID negative. Cranial CT scan showed no acute or new intracranial hemorrhage. Decreasing conspicuityof the right  frontal hemorrhage and slight decrease in associated edema. CT angiogram of head and neck with no emergent large vessel occlusion. MRI of the brain completed 11/23/2018 again showing no acute findings or new abnormality. Prior subacute hematoma measuring 4.2 x 2 cm. No significant adjacent edema or mass-effect. EEG negative for seizure. Most recent echocardiogram after initial [place] 10/22/2018 with ejection fraction of 60% hyperdynamic systolic function.Neurology follow-up patient's Keppra was changed to Depakote 500 mg twice daily. Still with bouts of confusion and restlessness and maintained on Seroquel. Tolerating a  regular diet. Therapy evaluations completed and patient was admitted for a comprehensive rehab program..  Patient transferred to CIR on 11/27/2018 .   Patient currently requires mod with mobility secondary to muscle weakness, decreased cardiorespiratoy endurance, decreased coordination, decreased attention, decreased awareness, decreased problem solving, decreased safety awareness, decreased memory and delayed processing, and decreased standing balance, decreased postural control and decreased balance strategies.  Prior to hospitalization, patient was independent  with mobility and lived with Spouse in a House home.  Home access is 2Stairs to enter.  Patient will benefit from skilled PT intervention to maximize safe functional mobility, minimize fall risk and decrease caregiver burden for planned discharge home with 24 hour supervision.  Anticipate patient will benefit from follow up [place] at discharge.  PT - End of Session Activity Tolerance: Tolerates 30+ min activity with multiple rests Endurance Deficit: Yes Endurance Deficit Description: 2/2 generalized deconditioning PT Assessment Rehab Potential (ACUTE/IP ONLY): Good PT Patient demonstrates impairments in the following area(s): Balance;Behavior;Endurance;Motor;Pain;Safety;Sensory PT Transfers Functional Problem(s): Bed Mobility;Bed to Chair;Car;Furniture PT Locomotion Functional Problem(s): Stairs;Ambulation PT Plan PT Intensity: Minimum of 1-2 x/day ,45 to 90 minutes PT Frequency: 5 out of 7 days PT Duration Estimated Length of Stay: 12-14 days PT Treatment/Interventions: Ambulation/gait training;Community reintegration;DME/adaptive equipment instruction;Neuromuscular re-education;Psychosocial support;Stair training;UE/LE Strength taining/ROM;UE/LE Coordination activities;Therapeutic Activities;Wheelchair propulsion/positioning;Pain management;Functional electrical stimulation;Discharge planning;Balance/vestibular training;Cognitive  remediation/compensation;Disease management/prevention;Functional mobility training;Patient/family education;Therapeutic Exercise;Visual/perceptual remediation/compensation PT Transfers Anticipated Outcome(s): supervision with LRAD PT Locomotion Anticipated Outcome(s): supervision with LRAD PT Recommendation Follow Up Recommendations: 24 hour supervision/assistance;Home health PT Patient destination: Home Equipment Recommended: To be determined  Skilled Therapeutic Intervention Pt received in chair & agreeable to tx. Mobility limited by pain in BLE feet today, pt with inconsistent reports it's similar to gout pain & RN administered pain meds at end of session with rest breaks provided PRN. Pt currently requires supervision for bed mobility with flat bed without rails, mod assist for sit<>stand with RW & cuing for hand placement, mod assist for stand pivot transfers with RW & cuing for technique, and pt is able to ambulate 3 ft with mod assist with RW + w/c follow for safety with pt limited by pain in BLE pain. Pt completes car transfer via stand pivot mod assist with RW and propels w/c 150 ft with BUE & min assist. Pt's wife [name]) arrived at end of session & therapist educated her on ELOS, daily therapy schedule, weekly team meetings, need for pt to only be assisted with mobility by staff, and anticipated level of care & PT goals. At end of session pt left in w/c with chair alarm donned, call bell in reach, wife present to supervise.   PT Evaluation Precautions/Restrictions Precautions Precautions: Fall Restrictions Weight Bearing Restrictions: No  General Chart Reviewed: Yes Additional Pertinent History: a-fib, HTN, chronic diastolic CHF, thoracic aortic aneurysm, COPD & OSA with CPAP, CKD stage 3, DM2, seizures, cardiomyopathy, dyspnea, gout, HLD, prostate CA Response to Previous Treatment: Patient with no complaints from  previous session. Family/Caregiver Present: Yes(wife [name]) arrived  at end of session)  Home Living/Prior Functioning Home Living Available Help at Discharge: Family Type of Home: House Home Access: Stairs to enter [employer] of Steps: 2 Entrance Stairs-Rails: Right;Left;Can reach both Home Layout: Able to live on main level with bedroom/bathroom;Two level Alternate Level Stairs-Number of Steps: elevator to 2nd level  Lives With: Spouse Prior Function Level of Independence: Independent with basic ADLs;Independent with transfers;Independent with gait  Able to Take Stairs?: Yes Driving: Yes Leisure: Hobbies-yes (Comment) Comments: golf  Vision/Perception  Pt reports hx of cataract removal ~2 years ago. Denies wearing glasses/contacts at this time. Pt denies blurry vision or diplopia.  Perception & praxis to be assess further in functional context.    Cognition Overall Cognitive Status: Impaired/Different from baseline Arousal/Alertness: Awake/alert Orientation Level: Oriented to person;Oriented to time;Disoriented to place;Disoriented to situation(oriented to year only) Memory: Impaired Memory Impairment: Decreased recall of new information;Decreased short term memory;Decreased long term memory Awareness: Impaired Awareness Impairment: Intellectual impairment Problem Solving: Impaired Problem Solving Impairment: Functional basic;Verbal basic Behaviors: Restless;Impulsive Safety/Judgment: Impaired   Sensation Sensation Light Touch: Not tested Proprioception: Not tested Gross Motor Movements are Fluid and Coordinated: No Fine Motor Movements are Fluid and Coordinated: Yes  Motor  Motor Motor: Abnormal postural alignment and control Motor - Skilled Clinical Observations: generalized deconditioning   Mobility Bed Mobility Bed Mobility: Rolling Right;Rolling Left;Supine to Sit;Sit to Supine Rolling Right: Supervision/verbal cueing Rolling Left: Supervision/Verbal cueing Supine to Sit: Supervision/Verbal cueing Sit to  Supine: Supervision/Verbal cueing Transfers Transfers: Sit to Stand;Stand Pivot Transfers;Stand to Sit Sit to Stand: Moderate Assistance - Patient 50-74% Stand to Sit: Moderate Assistance - Patient 50-74% Stand Pivot Transfers: Moderate Assistance - Patient 50 - 74% Stand Pivot Transfer Details: Verbal cues for sequencing;Verbal cues for technique;Verbal cues for precautions/safety Transfer (Assistive device): Rolling walker  Locomotion  Gait Ambulation: Yes Gait Assistance: 2 Helpers(mod assist + w/c follow for safety) Gait Distance (Feet): 3 Feet Assistive device: Rolling walker(gait belt) Gait Gait: Yes Gait Pattern: Impaired Gait Pattern: Decreased step length - right;Decreased step length - left;Decreased stride length(inconsistent step length & width, impaired balance) Stairs / Additional Locomotion Stairs: No Wheelchair Mobility Wheelchair Mobility: Yes Wheelchair Assistance: Minimal assistance - Patient >75% Wheelchair Propulsion: Both upper extremities Wheelchair Parts Management: Needs assistance Distance: 150 ft   Trunk/Postural Assessment  Cervical Assessment Cervical Assessment: Exceptions to WFL(Forward head) Thoracic Assessment Thoracic Assessment: Exceptions to WFL(Kyphotic; Rounded shoulders) Lumbar Assessment Lumbar Assessment: Exceptions to WFL(Posterior pelvic tilt) Postural Control Postural Control: Deficits on evaluation Righting Reactions: Delayed;insufficient  Balance Balance Balance Assessed: Yes Static Sitting Balance Static Sitting - Balance Support: Feet supported Static Sitting - Level of Assistance: 5: Stand by assistance  Static Standing Balance Static Standing - Balance Support: Bilateral upper extremity supported;During functional activity(standing with RW) Static Standing - Level of Assistance: 3: Mod assist Dynamic Standing Balance Dynamic Standing - Balance Support: Bilateral upper extremity supported;During functional  activity Dynamic Standing - Level of Assistance: 3: Mod assist(stand pivot, short distance gait with RW)  Extremity Assessment  RUE Assessment RUE Assessment: Within Functional Limits LUE Assessment LUE Assessment: Within Functional Limits BLE not formally tested, grossly 3+/5 as pt is able to weight bear with BLE knee flexion.   Refer to Care Plan for Long Term Goals  Recommendations for other services: None   Discharge Criteria: Patient will be discharged from PT if patient refuses treatment 3 consecutive times without medical reason, if treatment goals not met, if  there is a change in medical status, if patient makes no progress towards goals or if patient is discharged from hospital.  The above assessment, treatment plan, treatment alternatives and goals were discussed and mutually agreed upon: by family  [place] 11/28/2018, 12:55 PM

## 2018-11-28 NOTE — Care Management (Signed)
Inpatient [place] Individual Statement of Services  Patient Name:  Ronald [name]  Date:  11/28/2018  Welcome to the [facility].  Our goal is to provide you with an individualized program based on your diagnosis and situation, designed to meet your specific needs.  With this comprehensive rehabilitation program, you will be expected to participate in at least 3 hours of rehabilitation therapies Monday-Friday, with modified therapy programming on the weekends.  Your rehabilitation program will include the following services:  Physical Therapy (PT), Occupational Therapy (OT), Speech Therapy (ST), 24 hour per day rehabilitation nursing, Therapeutic Recreaction (TR), Neuropsychology, Case Management (Social Worker), Rehabilitation Medicine, Nutrition Services and Pharmacy Services  Weekly team conferences will be held on Tuesdays to discuss your progress.  Your Social Worker will talk with you frequently to get your input and to update you on team discussions.  Team conferences with you and your family in attendance may also be held.  Expected length of stay: 10-14 days   Overall anticipated outcome: supervision  Depending on your progress and recovery, your program may change. Your Social Worker will coordinate services and will keep you informed of any changes. Your Social Worker's name and contact numbers are listed  below.  The following services may also be recommended but are not provided by the [facility] will be made to provide these services after discharge if needed.  Arrangements include referral to agencies that provide these services.  Your insurance has been verified to be:  Medicare; [place] Your primary doctor is:  Press photographer  Pertinent information will be shared with your doctor and your insurance  company.  Social Worker:  [place], [place] or ([phone]   Information discussed with and copy given to patient by: Lennart Pall, 11/28/2018, 4:07 PM

## 2018-11-28 NOTE — Evaluation (Signed)
Occupational Therapy Assessment and Plan  Patient Details  Name: Ronald [name] MRN: [id number] Date of Birth: February 25, 1946  OT Diagnosis: acute pain, cognitive deficits and muscle weakness (generalized) Rehab Potential: Rehab Potential (ACUTE ONLY): Good ELOS: 10-12 days   Today's Date: 11/28/2018 OT Individual Time: 4801-6553 OT Individual Time Calculation (min): 68 min     Problem List:  Patient Active Problem List   Diagnosis Date Noted  . AMS (altered mental status) 11/23/2018  . Encephalopathy 11/22/2018  . CKD (chronic kidney disease), stage III ([place]) 11/22/2018  . Thoracic ascending aortic aneurysm ([place]) 11/22/2018  . Seizures ([place]), secondsry to [facility] 10/23/2018  . Coagulopathy ([place]), Eliquis 10/23/2018  . Cerebral edema ([place]) 10/23/2018  . Hypertensive emergency 10/23/2018  . Advanced age 73/16/2020  . ICH (intracerebral hemorrhage) ([place]) w/ SAH while on Eliquis 10/21/2018  . Hepatic steatosis 04/22/2018  . Actinic keratoses 06/07/2017  . BPH (benign prostatic hyperplasia) 05/30/2017  . Cardiomyopathy due to systemic disease ([place]) 03/09/2017  . Preop cardiovascular exam 03/07/2017  . Hypervolemia   . Chronic diastolic CHF (congestive heart failure) ([place])   . Hypoxia 02/21/2017  . ARF (acute renal failure) ([place])   . Hypomagnesemia 02/20/2017  . GI bleed 02/20/2017  . History of colonic polyps 07/03/2016  . Chronic anticoagulation 07/03/2016  . Hyperglycemia, drug-induced 04/05/2015  . 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 ([place]) 02/21/2015  . On amiodarone therapy 12/08/2014  . Edema of both legs 12/08/2014  . Exertional dyspnea 08/18/2014  . Hypokalemia   . Tobacco abuse   . Paroxysmal atrial fibrillation ([place]) 05/04/2014  . Cigarette smoker two packs a day or less   . Prostate cancer ([place]) 10/15/2013  . Obesity (BMI 30-39.9) 04/17/2013  . Umbilical hernia 74/82/7078  . Right inguinal hernia 06/23/2012  . Hydrocele 06/19/2012  .  Metabolic syndrome 67/54/4920  . Type 2 diabetes mellitus with hyperglycemia ([place]) 03/20/2012  . Elevated PSA 03/20/2012  . GOUT, UNSPECIFIED 10/07/2009  . TINEA VERSICOLOR 07/19/2009  . PERS HX TOBACCO USE PRESENTING HAZARDS HEALTH 07/19/2009  . Obstructive sleep apnea 07/02/2008  . Hyperlipidemia with target LDL less than 70 07/01/2008  . Essential hypertension 07/01/2008  . ALLERGIC RHINITIS 07/01/2008    Past Medical History:  Past Medical History:  Diagnosis Date  . Allergic rhinitis   . Anticoagulant long-term use    eliquis  . Cardiomyopathy due to systemic disease [facility])    followed by dr harding  . COPD with emphysema [facility])    pulmologist-  dr Halford Chessman  . Dyspnea    occasional per pt  . History of colon polyps    tubular adenoma 2013  . History of gout    09-05-2017 last flare-up  05/ 2019 3 wks ago, feet  . History of sepsis 02/18/2017   per d/c note probable uti, acute chf, acute renal failure, hypoxia  . Hyperlipidemia   . Hyperplasia of prostate with lower urinary tract symptoms (LUTS)   . Hypertension   . OSA on CPAP    per study 08-03-2004  Severe OSA  . Persistent atrial fibrillation    cardiologist --  dr Dorris Carnes--  post cardioversion 05-07-2014  . Prostate cancer [facility]) urologsit-  dr Alyson Ingles--- as of 05-21-2017 per pt last PSA 11 approx.   Dx  2014--  stage T1c, Gleason 3+3=6, PSA 6.67--  Active surveillance/  04/ 2019  Stage T1b, Gleason 3+4, PSA 12.8- plan external radiation therapy    . 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 ([place])  pulmologist-  dr Halford Chessman  . Seasonal allergies   . Sigmoid diverticulosis   . Systolic and diastolic CHF, chronic [facility])    cardiologist-  [name]  . Tinea versicolor   . Type 2 diabetes mellitus ([place])   . Wears hearing aid in both ears    Past Surgical History:  Past Surgical History:  Procedure Laterality Date  . CARDIOVERSION N/A 05/07/2014   Procedure: CARDIOVERSION;  Surgeon: [name], MD;  Location: [facility] ENDOSCOPY;  Service: Cardiovascular;  Laterality: N/A;  . CARDIOVERSION N/A 09/09/2014   Procedure: CARDIOVERSION;  Surgeon: Lelon Perla, MD;  Location: [facility] ENDOSCOPY;  Service: Cardiovascular;  Laterality: N/A;  successfully  . CATARACT EXTRACTION W/ INTRAOCULAR LENS  IMPLANT, BILATERAL  08 and 09/  2018  . COLONOSCOPY  last one 06-07-2011  . GOLD SEED IMPLANT N/A 09/09/2017   Procedure: GOLD SEED IMPLANT;  Surgeon: [name], MD;  Location: [facility];  Service: Urology;  Laterality: N/A;  . HYDROCELE EXCISION Bilateral 09/26/2015   Procedure: HYDROCELECTOMY ADULT;  Surgeon: [name], MD;  Location: [facility];  Service: Urology;  Laterality: Bilateral;  . LAMINECTOMY AND MICRODISCECTOMY LUMBAR SPINE  12-23-2003   Left L5 -- S1  . LAPAROSCOPIC BILATERAL INGUINAL HERNIA REPAIR/  UMBILICAL HERNIA REPAIR WITH MESH/  ASPIRATION LEFT HYDROCELE  07-11-2012  . NM MYOVIEW LTD  05/18/2014   Low risk study. Normal perfusion: No ischemia or infarction. Mild LV dysfunction - 46% (does not correlate with echocardiographic EF of 50-55%)  . PROSTATE BIOPSY  05/15/12   Clinically both Lobes  . SPACE OAR INSTILLATION N/A 09/09/2017   Procedure: SPACE OAR INSTILLATION;  Surgeon: [name], MD;  Location: [facility];  Service: Urology;  Laterality: N/A;  . TEE WITHOUT CARDIOVERSION N/A 05/07/2014   Procedure: TRANSESOPHAGEAL ECHOCARDIOGRAM (TEE);  Surgeon: [name], MD;  Location: [facility] ENDOSCOPY;  Service: Cardiovascular;  Laterality: N/A;   mild atherosclerosis plaque of aorta,  mild AR, MR, and TR,  no cardiac source of emboli  . TONSILLECTOMY  as child  . TRANSTHORACIC ECHOCARDIOGRAM  11/'18; 1/'19   a) In setting of sepsis: EF of 40-45%.  Diffuse hypokinesis.  No RWMA.  Biatrial enlargement.;; b) ** f/u Jan 2019: Normal LVF 55-60%** , mildly dilated aortic root(38 mm) and ascending aorta (44 mm). Compared to prior  echo, LVEF has improved.  . TRANSURETHRAL RESECTION OF PROSTATE N/A 05/30/2017   Procedure: TRANSURETHRAL RESECTION OF THE PROSTATE (TURP);  Surgeon: [name], MD;  Location: [facility];  Service: Urology;  Laterality: N/A;    Assessment & Plan Clinical Impression: [name] L. Wilborneis a 73 year old right-handed male with history significant for right frontal ICH related to Eliquis and admitted 10/21/2018 until 10/27/2018 and discharged to home ambulating 240 feet without assistive device, atrial fibrillationfollowed by Dr. Dorris Carnes andno longer on anticoagulation, hypertension, chronic diastolic congestive heart failure,history of ascending thoracic aortic aneurysm followed as an outpatient.,COPDmaintained on CPAP and followed by Dr. Halford Chessman, chronic kidney disease stage IIIwith creatinine 1.62, type 2 diabetes mellitus and seizuremaintained on Keppra 500 mg twice daily. Per chart review patient lives with spouse. Two-level home with bed and bathroom on Main level. Reportedly independent prior to admission. Presented 11/22/2018 with increased altered mental status and poor balance leaning to the right side. Urinalysis negative, COVID negative. Cranial CT scan showed no acute or new intracranial hemorrhage. Decreasing conspicuityof the right frontal hemorrhage and slight decrease in associated edema.  CT angiogram of head and neck with no emergent large vessel occlusion. MRI of the brain completed 11/23/2018 again showing no acute findings or new abnormality. Prior subacute hematoma measuring 4.2 x 2 cm. No significant adjacent edema or mass-effect. EEG negative for seizure. Most recent echocardiogram after initial [place] 10/22/2018 with ejection fraction of 60% hyperdynamic systolic function.Neurology follow-up patient's Keppra was changed to Depakote 500 mg twice daily. Still with bouts of confusion and restlessness and maintained on Seroquel. Tolerating a regular diet. Therapy  evaluations completed and patient was admitted for a comprehensive rehab program. Patient transferred to CIR on 11/27/2018 .    Patient currently requires mod with basic self-care skills secondary to muscle weakness, decreased cardiorespiratoy endurance, decreased coordination, decreased attention to right, decreased attention, decreased awareness, decreased problem solving, decreased safety awareness, decreased memory and delayed processing and decreased standing balance, decreased postural control and decreased balance strategies.  Prior to hospitalization, patient could complete ADLs/IADLs with independent .  Patient will benefit from skilled intervention to decrease level of assist with basic self-care skills and increase independence with basic self-care skills prior to discharge home with care partner.  Anticipate patient will require 24 hour supervision and follow up home health.  OT - End of Session Activity Tolerance: Tolerates 10 - 20 min activity with multiple rests Endurance Deficit: Yes Endurance Deficit Description: Requires rest breaks throughout seated level ADL tasks OT Assessment Rehab Potential (ACUTE ONLY): Good OT Patient demonstrates impairments in the following area(s): Balance;Safety;Behavior;Cognition;Endurance;Motor;Pain OT Basic ADL's Functional Problem(s): Grooming;Bathing;Dressing;Toileting OT Transfers Functional Problem(s): Toilet;Tub/Shower OT Additional Impairment(s): None OT Plan OT Intensity: Minimum of 1-2 x/day, 45 to 90 minutes OT Frequency: 5 out of 7 days OT Duration/Estimated Length of Stay: 10-12 days OT Treatment/Interventions: Balance/vestibular training;Discharge planning;Pain management;Self Care/advanced ADL retraining;Therapeutic Activities;UE/LE Coordination activities;Therapeutic Exercise;Patient/family education;Functional mobility training;Cognitive remediation/compensation;Community reintegration;DME/adaptive equipment instruction;Psychosocial  support;UE/LE Strength taining/ROM OT Self Feeding Anticipated Outcome(s): Indep OT Basic Self-Care Anticipated Outcome(s): Supervision OT Toileting Anticipated Outcome(s): Supervision OT Bathroom Transfers Anticipated Outcome(s): Supervision OT Recommendation Recommendations for Other Services: Neuropsych consult Patient destination: Home Follow Up Recommendations: Home health OT Equipment Recommended: To be determined   Skilled Therapeutic Intervention Pt seen for OT eval and ADL bathing/dressing session. Pt awake in supine upon arrival, messing with CPAP head piece. He was able to state what it was and able to be re-directed. Complaints of gout pain throughout session, MD aware and pt redirected from pain throughout session. Pt oriented to self and having had "stroke". Pt not oriented to place.  He required mod A for sit>stand from elevated EOB with RW. He ambulated to sink and completed bathing/dressing while seated in standard chair. He required min A overall for bathing, however, max VCs for redirection to task throughout bathing/dressing tasks as pt easily distracted by internal and external factors. He stood at sink with mod A in order to complete pericare/buttock hygiene and for clothing management to be completed. Pt tolerated ~10 seconds standing each trial with min-mod A for standing balance due to R lean without pt awareness.  He ambulated into bathroom with mod A using RW with multi-modal cuing for RW management and safety during functional mobility. Completed toileting tasks with mod A for standing balance while pt managed clothing. Pt able to complete hygiene from seated position.  Pt returned out to chair at end of session, left seated in w/c with all needs in reach, chair belt alarm on and all needs in reach. Education provided regarding role of OT, POC, and  d/c planning.    OT Evaluation Precautions/Restrictions  Precautions Precautions: Fall Restrictions Weight Bearing  Restrictions: No General Chart Reviewed: Yes Pain Pain Assessment Pain Scale: Faces Faces Pain Scale: Hurts even more Pain Type: Acute pain Pain Location: Foot Pain Orientation: Right;Left Pain Descriptors / Indicators: Aching Pain Frequency: Constant Pain Onset: On-going Pain Intervention(s): Medication (See eMAR) Home Living/Prior [place] expects to be discharged to:: Private residence Living Arrangements: Spouse/significant other Available Help at Discharge: Family, Available 24 hours/day Type of Home: House Home Access: Level entry, Elevator Home Layout: Two level, Able to live on main level with bedroom/bathroom Bathroom Shower/Tub: Multimedia programmer: Handicapped height Additional Comments: All info per acute care chart, pt unable to answer due to cognitive deficits  Lives With: Spouse IADL History Education: Water quality scientist  Occupation: Retired Leisure and Hobbies: Insurance underwriter, golf Prior Function Level of Independence: Independent with basic ADLs, Independent with gait, Independent with transfers, Independent with homemaking with ambulation Vocation: Part time employment(sales) Vision Baseline Vision/History: Wears glasses Wears Glasses: At all times Vision Assessment?: No apparent visual deficits(Difficult to formally assess 2/2 cognitive deficits, however, able to read labels on shirt and self-care items) Perception  Perception: Within Functional Limits Praxis Praxis: Impaired Praxis Impairment Details: Perseveration Cognition Overall Cognitive Status: Impaired/Different from baseline Arousal/Alertness: Awake/alert Orientation Level: Person;Situation(Stated he was in a nursing home in [place]. Stated he "had a stroke") Month: August Day of Week: Incorrect Memory: Impaired Memory Impairment: Decreased recall of new information;Decreased short term memory;Retrieval deficit Decreased Short Term Memory: Verbal basic;Functional  basic Immediate Memory Recall: Sock;Blue;Bed Memory Recall Sock: Not able to recall Memory Recall Blue: Without Cue Memory Recall Bed: Not able to recall Attention: Focused Focused Attention: Impaired Focused Attention Impairment: Verbal basic;Functional basic Awareness: Impaired Awareness Impairment: Intellectual impairment Problem Solving: Impaired Problem Solving Impairment: Verbal basic;Functional basic Executive Function: (All impaired 2/2 lower level deficits) Behaviors: Impulsive;Perseveration Safety/Judgment: Impaired Comments: Poor attention to task.Very limited safety awareness with difficulty following VCs for safety Sensation Sensation Light Touch: Not tested Hot/Cold: Not tested Proprioception: Appears Intact Stereognosis: Appears Intact Additional Comments: Unable to formally assess 2/2 cognitive deficits, however, pt able to use B UEs throughout functional tasks without deficits observed Coordination Gross Motor Movements are Fluid and Coordinated: No Fine Motor Movements are Fluid and Coordinated: Yes Coordination and Movement Description: R lean. Movements impaired by gout pain in B feet Motor  Motor Motor: Abnormal postural alignment and control Motor - Skilled Clinical Observations: R lean, general deconditioning Trunk/Postural Assessment  Cervical Assessment Cervical Assessment: Exceptions to WFL(Forward head) Thoracic Assessment Thoracic Assessment: Exceptions to WFL(Kyphotic; Rounded shoulders) Lumbar Assessment Lumbar Assessment: Exceptions to WFL(Posterior pelvic tilt) Postural Control Postural Control: Deficits on evaluation Righting Reactions: Delayed;insufficient Postural Limitations: R lean  Balance Balance Balance Assessed: Yes Static Sitting Balance Static Sitting - Balance Support: No upper extremity supported;Feet supported Static Sitting - Level of Assistance: 5: Stand by assistance Static Sitting - Comment/# of Minutes: Sitting  EOB Dynamic Sitting Balance Dynamic Sitting - Balance Support: During functional activity;Feet supported Dynamic Sitting - Level of Assistance: 5: Stand by assistance;4: Min assist Sitting balance - Comments: Sitting in standard chair to complete bathing/dressing routine Static Standing Balance Static Standing - Balance Support: During functional activity;Right upper extremity supported;Left upper extremity supported Static Standing - Level of Assistance: 3: Mod assist Dynamic Standing Balance Dynamic Standing - Balance Support: During functional activity;Right upper extremity supported;Left upper extremity supported Dynamic Standing - Level of Assistance: 3: Mod assist Dynamic Standing -  Comments: Standing to complete LB clothing management Extremity/Trunk Assessment RUE Assessment RUE Assessment: Within Functional Limits LUE Assessment LUE Assessment: Within Functional Limits     Refer to Care Plan for Long Term Goals  Recommendations for other services: Neuropsych   Discharge Criteria: Patient will be discharged from OT if patient refuses treatment 3 consecutive times without medical reason, if treatment goals not met, if there is a change in medical status, if patient makes no progress towards goals or if patient is discharged from hospital.  The above assessment, treatment plan, treatment alternatives and goals were discussed and mutually agreed upon: by patient  Lui Bellis L 11/28/2018, 12:13 PM

## 2018-11-29 ENCOUNTER — Inpatient Hospital Stay (HOSPITAL_COMMUNITY): Payer: Federal, State, Local not specified - PPO | Admitting: Speech Pathology

## 2018-11-29 ENCOUNTER — Inpatient Hospital Stay (HOSPITAL_COMMUNITY): Payer: Federal, State, Local not specified - PPO

## 2018-11-29 DIAGNOSIS — R569 Unspecified convulsions: Secondary | ICD-10-CM

## 2018-11-29 DIAGNOSIS — I619 Nontraumatic intracerebral hemorrhage, unspecified: Secondary | ICD-10-CM

## 2018-11-29 DIAGNOSIS — I10 Essential (primary) hypertension: Secondary | ICD-10-CM

## 2018-11-29 DIAGNOSIS — I480 Paroxysmal atrial fibrillation: Secondary | ICD-10-CM

## 2018-11-29 NOTE — Progress Notes (Signed)
Speech Language Pathology Daily Session Note  Patient Details  Name: Ronald [name] MRN: [id number] Date of Birth: Aug 19, 1945  Today's Date: 11/29/2018 SLP Individual Time: 0900-0930 SLP Individual Time Calculation (min): 30 min  Short Term Goals: Week 1: SLP Short Term Goal 1 (Week 1): Pt will consistently demonstrate Ox4 with MaxA verbal and visual cues SLP Short Term Goal 2 (Week 1): Pt will attend to basic familiar tasks for 10 minute intervals with ModA verbal cues for redirection. SLP Short Term Goal 3 (Week 1): Pt will recall new daily information with ModA multimodal cues SLP Short Term Goal 4 (Week 1): Pt will demonstrate functional problem solving for basic and familiar tasks with ModA multimodal cues.  Skilled Therapeutic Interventions: Skilled treatment session focused on cognitive goals. SLP facilitated session by providing Max A verbal cues for patient to utilize external aids (newspaper, cell phone) for orientation to date. SLP created a calendar per patient's request to be displayed in room for ease of recall and utilization. SLP also facilitated session by providing Mod A verbal cues for organization and Min A verbal cues for basic problem solving during a basic money management task. Patient left upright in wheelchair with alarm on and all needs within reach. Continue with current plan of care.      Pain Pain Assessment Pain Scale: 0-10 Pain Score: 0-No pain  Therapy/Group: Individual Therapy  Madylyn Insco 11/29/2018, 12:52 PM

## 2018-11-29 NOTE — Progress Notes (Signed)
Ronald [name] is a 73 y.o. male 1945-11-29 [id number]  Subjective: Reports gout pain is improving and both feet/ankles remain swollen because of same.  Reports otherwise feeling well.  No headache or trouble breathing.   Objective: Vital signs in last 24 hours: Temp:  [97.7 F (36.5 C)-97.9 F (36.6 C)] 97.9 F (36.6 C) (08/21 1958) Pulse Rate:  [69-84] 69 (08/22 0701) Resp:  [14-18] 18 (08/21 1958) BP: (101-153)/(64-85) 153/85 (08/22 0701) SpO2:  [93 %-96 %] 94 % (08/22 0758) Weight change:  Last BM Date: 11/28/18  Intake/Output from previous day: 08/21 0701 - 08/22 0700 In: 535 [P.O.:535] Out: -   Physical Exam General: No apparent distress   sitting in chair at bedside with breakfast tray. Lungs: Normal effort. Lungs clear to auscultation, no crackles or wheezes. Cardiovascular: Regular rate and rhythm, no edema Neurological: No new neurological deficits   Lab Results: BMET    Component Value Date/Time   NA 137 11/28/2018 0720   K 3.4 (L) 11/28/2018 0720   CL 100 11/28/2018 0720   CO2 24 11/28/2018 0720   GLUCOSE 131 (H) 11/28/2018 0720   BUN 30 (H) 11/28/2018 0720   CREATININE 1.65 (H) 11/28/2018 0720   CREATININE 0.96 03/21/2015 1022   CALCIUM 9.1 11/28/2018 0720   GFRNONAA 41 (L) 11/28/2018 0720   GFRNONAA 80 03/21/2015 1022   GFRAA 47 (L) 11/28/2018 0720   GFRAA >89 03/21/2015 1022   CBC    Component Value Date/Time   WBC 8.6 11/28/2018 0720   RBC 4.57 11/28/2018 0720   HGB 15.2 11/28/2018 0720   HCT 45.1 11/28/2018 0720   PLT 203 11/28/2018 0720   MCV 98.7 11/28/2018 0720   MCH 33.3 11/28/2018 0720   MCHC 33.7 11/28/2018 0720   RDW 12.1 11/28/2018 0720   LYMPHSABS 0.8 11/28/2018 0720   MONOABS 0.8 11/28/2018 0720   EOSABS 0.0 11/28/2018 0720   BASOSABS 0.0 11/28/2018 0720   CBG's (last 3):   Recent Labs    11/28/18 0615 11/28/18 0643 11/28/18 1135  GLUCAP 114* 113* 139*   LFT's Lab Results  Component Value Date   ALT 16 11/28/2018    AST 13 (L) 11/28/2018   ALKPHOS 80 11/28/2018   BILITOT 1.5 (H) 11/28/2018    Studies/Results: No results found.  Medications:  I have reviewed the patient's current medications. Scheduled Medications: . amiodarone  200 mg Oral Daily  . amLODipine  10 mg Oral Daily  . atorvastatin  10 mg Oral QHS  . carvedilol  12.5 mg Oral BID WC  . divalproex  500 mg Oral Q12H  . insulin aspart  0-9 Units Subcutaneous TID WC  . losartan  25 mg Oral Daily  . mometasone-formoterol  2 puff Inhalation BID  . potassium chloride  20 mEq Oral Daily  . predniSONE  10 mg Oral BID WC  . QUEtiapine  25 mg Oral QHS   PRN Medications: acetaminophen **OR** acetaminophen, albuterol, HYDROcodone-acetaminophen, ondansetron **OR** ondansetron (ZOFRAN) IV, polyethylene glycol, sorbitol  Assessment/Plan: Principal Problem:   ICH (intracerebral hemorrhage) (HCC) w/ SAH while on Eliquis Active Problems:   Essential hypertension   Gout, unspecified   Type 2 diabetes mellitus with hyperglycemia (HCC)   Paroxysmal atrial fibrillation (HCC)   Seizures ([place]), secondsry to ICH   Encephalopathy   Length of stay, days: 2 1.  Functional deficits and altered mental status following right frontal ICH with associated seizure and encephalopathy.  Continue intensive inpatient rehab with PT, OT and SLP as  ongoing.  Eliquis on hold following intracranial hemorrhage.  Continue medical management of associated medical comorbidities 2.  Seizure disorder following ICH.  Keppra changed to Depakote.  No further seizure activity on chart review.  Continue to monitor. 3.  Atrial fibrillation.  Eliquis anticoagulation discontinued following ICH.  Continues amiodarone daily with outpatient cardiology follow-up as planned 4.  COPD.  Compensated without exacerbation at present.  Continue Dulera therapy 5.  Type 2 diabetes well controlled with A1c of 6.9.  On metformin prior to admission, currently on sliding scale insulin only  depending on oral intake 6.  Gout.  Continue prednisone taper as ongoing with hydrocodone as needed for breakthrough pain 7.  Hypertension.  Controlled on current medications.  Monitor and titrate as needed  Roneka Gilpin A. Asa Lente, MD 11/29/2018, 12:33 PM

## 2018-11-29 NOTE — Progress Notes (Signed)
Physical Therapy Session Note  Patient Details  Name: Ronald [name] MRN: [id number] Date of Birth: 1946-02-05  Today's Date: 11/29/2018 PT Individual Time: 1419-1520 PT Individual Time Calculation (min): 61 min   Short Term Goals: Week 1:  PT Short Term Goal 1 (Week 1): Pt will ambulate 50 ft with LRAD & min assist. PT Short Term Goal 2 (Week 1): Pt will consistently complete transfers with min assist & LRAD. PT Short Term Goal 3 (Week 1): Pt will negotiate 4 steps with B rails & mod assist.  Skilled Therapeutic Interventions/Progress Updates:    Session focused on cognitive remediation and functional mobility retraining including transfers, NMR for dynamic standing balance, balance assessment for fall risk, gait training, and Nustep (for BLE/BUE strengthening and endurance for reciprocal movement pattern retraining on level 5 x 10 min). Pt tendency to repeat story of what happened to him coming into the hospital and his hospital course at times tangential and easily distracted. Requires min verbal cues throughout session for overall safety due to slight impulsivity. Easily redirected back to task when distracted. Pt performs functional transfers with RW with min assist overall with cues for hand placement and technique. NMR for dynamic standing balance activity reaching outside BOS and placing items overhead and reaching to simulate functional task. Gait x 60' with RW with overall min assist except 1 episode of mod assist due to poor use of RW during turns. Short distance gait ~ 10' with min assist x 2 occasions. Does require repeated cues for safe transitions of movement due to placement of RW or not paying attention to where the seat/chair is located. Decreased memory noted as pt's wife was here at start of session but left and said she would be back at dinner time and pt unable to recall this. Upon return to room, pt wanted to look through his things to decide what to send home with his wife -  self propelled w/c with supervision opening drawers and navigating around obstacles. Left with NT getting vitals.   Therapy Documentation Precautions:  Precautions Precautions: Fall Restrictions Weight Bearing Restrictions: No  Pain: C/o gout pain in feet, reports being premedicated Balance:    Five times Sit to Stand Test (FTSS) Method: Use a straight back chair with a solid seat that is 16-18" high. Ask participant to sit on the chair with arms folded across their chest.   Instructions: "Stand up and sit down as quickly as possible 5 times, keeping your arms folded across your chest."   Measurement: Stop timing when the participant stands the 5th time.  TIME: __20____ (in seconds)  Times > 13.6 seconds is associated with increased disability and morbidity (Guralnik, 2000) Times > 15 seconds is predictive of recurrent falls in healthy individuals aged 52 and older (Buatois, et al., 2008) Normal performance values in community dwelling individuals aged 42 and older (Bohannon, 2006): o 60-69 years: 11.4 seconds o 70-79 years: 12.6 seconds o 80-89 years: 14.8 seconds  MCID: ? 2.3 seconds for Vestibular Disorders (Meretta, 2006)   Therapy/Group: Individual Therapy  Canary Brim Ivory Broad, PT, DPT, CBIS  11/29/2018, 3:24 PM

## 2018-11-29 NOTE — Progress Notes (Signed)
Restful throughout shift without acute distress or discomfort, VSS, Respiration unlabored, CPAP in place. Continue regime , Tele-sitter continue, bed alarm and call bell in place, monitor per department protocol

## 2018-11-29 NOTE — Progress Notes (Signed)
Occupational Therapy Session Note  Patient Details  Name: Ronald [name] MRN: [id number] Date of Birth: 04/08/1946  Today's Date: 11/29/2018 OT Individual Time: [phone] OT Individual Time Calculation (min): 75 min    Short Term Goals: Week 1:  OT Short Term Goal 1 (Week 1): Pt will stand with CGA to complete 2 grooming tasks in order to increase functional standing tolerance OT Short Term Goal 2 (Week 1): Pt will maintain attention to full bathing task with no more than 2 VCs for redirection OT Short Term Goal 3 (Week 1): Pt will ambulate into bathroom for toileting task with min A using LRAD in prep for home mobility OT Short Term Goal 4 (Week 1): Pt will consistently completed sit>stand with min A using LRAD  Skilled Therapeutic Interventions/Progress Updates:    1;1. Pr received in bed asleep but easily aroused. Pt sheets saturated in urine. Pt agreeable to bathing at shower level this date. Pt c/o gout pain in R ankle but declines medication. Pt completes stand pivot transfers with MOD A EOB>w/c<>BSC over shower and in bathroom using grab bar and bed rail (heavy) reliance. Pt requires MAX A for toileting CM. Pt bathes with MOD VC for sequencing and termination of rinsing with water d/t distraction with internal stimuli. Pt requires MIN A for standing in shower with grab bars while washing buttock with wide BOS with feet. Pt dresses seated in in w/c at sink with S for UB and MOD A for donning brief and max VC for problem solving threading BLE into pants. Pt grooms at sink with VC for termination of combing hair.   Exited session with pt seated in w/c, belt alarm on, call light in reach and set up with breakfast tray.  Therapy Documentation Precautions:  Precautions Precautions: Fall Restrictions Weight Bearing Restrictions: No General:   Vital Signs:  Pain: Pain Assessment Pain Score: Asleep ADL:   Vision   Perception    Praxis   Exercises:   Other Treatments:      Therapy/Group: Individual Therapy  Tonny Branch 11/29/2018, 7:39 AM

## 2018-11-30 DIAGNOSIS — G9340 Encephalopathy, unspecified: Secondary | ICD-10-CM

## 2018-11-30 LAB — URINALYSIS, ROUTINE W REFLEX MICROSCOPIC

## 2018-11-30 LAB — URINALYSIS, MICROSCOPIC (REFLEX)
RBC / HPF: >50 RBC/hpf (ref 0–5)
WBC, UA: >50 WBC/hpf (ref 0–5)

## 2018-11-30 NOTE — Progress Notes (Signed)
Ronald [name] is a 73 y.o. male 09/27/1945 [id number]  Subjective: Reports gout pain is improving and both feet/ankles remain swollen because of same.  Reports otherwise feeling well.  No headache or trouble breathing.   Objective: Vital signs in last 24 hours: Temp:  [97.6 F (36.4 C)-98 F (36.7 C)] 97.6 F (36.4 C) (08/23 0558) Pulse Rate:  [72-77] 74 (08/23 0558) Resp:  [17-20] 17 (08/23 0558) BP: (120-153)/(68-83) 153/83 (08/23 0558) SpO2:  [93 %-98 %] 97 % (08/23 0558) Weight:  [99.3 kg] 99.3 kg (08/23 0556) Weight change:  Last BM Date: 11/28/18  Intake/Output from previous day: 08/22 0701 - 08/23 0700 In: 240 [P.O.:240] Out: 600 [Urine:600]  Physical Exam General: No apparent distress   sitting in chair at bedside with breakfast tray. Lungs: Normal effort. Lungs clear to auscultation, no crackles or wheezes. Cardiovascular: Regular rate and rhythm, no edema Neurological: No new neurological deficits   Lab Results: BMET    Component Value Date/Time   NA 137 11/28/2018 0720   K 3.4 (L) 11/28/2018 0720   CL 100 11/28/2018 0720   CO2 24 11/28/2018 0720   GLUCOSE 131 (H) 11/28/2018 0720   BUN 30 (H) 11/28/2018 0720   CREATININE 1.65 (H) 11/28/2018 0720   CREATININE 0.96 03/21/2015 1022   CALCIUM 9.1 11/28/2018 0720   GFRNONAA 41 (L) 11/28/2018 0720   GFRNONAA 80 03/21/2015 1022   GFRAA 47 (L) 11/28/2018 0720   GFRAA >89 03/21/2015 1022   CBC    Component Value Date/Time   WBC 8.6 11/28/2018 0720   RBC 4.57 11/28/2018 0720   HGB 15.2 11/28/2018 0720   HCT 45.1 11/28/2018 0720   PLT 203 11/28/2018 0720   MCV 98.7 11/28/2018 0720   MCH 33.3 11/28/2018 0720   MCHC 33.7 11/28/2018 0720   RDW 12.1 11/28/2018 0720   LYMPHSABS 0.8 11/28/2018 0720   MONOABS 0.8 11/28/2018 0720   EOSABS 0.0 11/28/2018 0720   BASOSABS 0.0 11/28/2018 0720   CBG's (last 3):   Recent Labs    11/28/18 0615 11/28/18 0643 11/28/18 1135  GLUCAP 114* 113* 139*   LFT's Lab  Results  Component Value Date   ALT 16 11/28/2018   AST 13 (L) 11/28/2018   ALKPHOS 80 11/28/2018   BILITOT 1.5 (H) 11/28/2018    Studies/Results: No results found.  Medications:  I have reviewed the patient's current medications. Scheduled Medications: . amiodarone  200 mg Oral Daily  . amLODipine  10 mg Oral Daily  . atorvastatin  10 mg Oral QHS  . carvedilol  12.5 mg Oral BID WC  . divalproex  500 mg Oral Q12H  . insulin aspart  0-9 Units Subcutaneous TID WC  . losartan  25 mg Oral Daily  . mometasone-formoterol  2 puff Inhalation BID  . potassium chloride  20 mEq Oral Daily  . QUEtiapine  25 mg Oral QHS   PRN Medications: acetaminophen **OR** acetaminophen, albuterol, HYDROcodone-acetaminophen, ondansetron **OR** ondansetron (ZOFRAN) IV, polyethylene glycol, sorbitol  Assessment/Plan: Principal Problem:   ICH (intracerebral hemorrhage) (HCC) w/ SAH while on Eliquis Active Problems:   Essential hypertension   Gout, unspecified   Type 2 diabetes mellitus with hyperglycemia (HCC)   Paroxysmal atrial fibrillation (HCC)   Seizures ([place]), secondsry to ICH   Encephalopathy   Length of stay, days: 3 1.  Functional deficits and altered mental status following right frontal ICH with associated seizure and encephalopathy.  Continue intensive inpatient rehab with PT, OT and SLP as ongoing.  Eliquis on hold following intracranial hemorrhage.  Continue medical management of associated medical comorbidities 2.  Seizure disorder following ICH.  Keppra changed to Depakote.  No further seizure activity on chart review.  Continue to monitor. 3.  Atrial fibrillation.  Eliquis anticoagulation discontinued following ICH.  Continues amiodarone daily with outpatient cardiology follow-up as planned 4.  COPD.  Compensated without exacerbation at present.  Continue Dulera therapy 5.  Type 2 diabetes well controlled with A1c of 6.9.  On metformin prior to admission, currently on sliding scale  insulin only depending on oral intake 6.  Gout.  Continue prednisone taper as ongoing with hydrocodone as needed for breakthrough pain 7.  Hypertension.  Controlled on current medications.  Monitor and titrate as needed 8. Dark urine per RN - no GU concerns from patient - UA pending to eval  Omni Dunsworth A. Asa Lente, MD 11/30/2018, 10:23 AM

## 2018-11-30 NOTE — Progress Notes (Signed)
Patient urinated urine noted dark red, with strong odor. No pain/distress noted while urinating. MD on call V.Leschner notified of concern, order to collect urinalysis given. Will continue to monitor and assess.

## 2018-11-30 NOTE — Progress Notes (Signed)
Pt refuses to wear CPAP tonight.

## 2018-12-01 ENCOUNTER — Inpatient Hospital Stay (HOSPITAL_COMMUNITY): Payer: Federal, State, Local not specified - PPO | Admitting: Speech Pathology

## 2018-12-01 ENCOUNTER — Inpatient Hospital Stay (HOSPITAL_COMMUNITY): Payer: Federal, State, Local not specified - PPO

## 2018-12-01 ENCOUNTER — Inpatient Hospital Stay (HOSPITAL_COMMUNITY): Payer: Federal, State, Local not specified - PPO | Admitting: Physical Therapy

## 2018-12-01 LAB — BASIC METABOLIC PANEL
Anion gap: 10 (ref 5–15)
BUN: 24 mg/dL — ABNORMAL HIGH (ref 8–23)
CO2: 27 mmol/L (ref 22–32)
Calcium: 8.8 mg/dL — ABNORMAL LOW (ref 8.9–10.3)
Chloride: 103 mmol/L (ref 98–111)
Creatinine, Ser: 1.42 mg/dL — ABNORMAL HIGH (ref 0.61–1.24)
GFR calc Af Amer: 56 mL/min — ABNORMAL LOW (ref >60)
GFR calc non Af Amer: 49 mL/min — ABNORMAL LOW (ref >60)
Glucose, Bld: 135 mg/dL — ABNORMAL HIGH (ref 70–99)
Potassium: 3.9 mmol/L (ref 3.5–5.1)
Sodium: 140 mmol/L (ref 135–145)

## 2018-12-01 LAB — GLUCOSE, CAPILLARY
Glucose-Capillary: 181 mg/dL — ABNORMAL HIGH (ref 70–99)
Glucose-Capillary: 156 mg/dL — ABNORMAL HIGH (ref 70–99)
Glucose-Capillary: 123 mg/dL — ABNORMAL HIGH (ref 70–99)
Glucose-Capillary: 167 mg/dL — ABNORMAL HIGH (ref 70–99)
Glucose-Capillary: 159 mg/dL — ABNORMAL HIGH (ref 70–99)
Glucose-Capillary: 209 mg/dL — ABNORMAL HIGH (ref 70–99)
Glucose-Capillary: 147 mg/dL — ABNORMAL HIGH (ref 70–99)
Glucose-Capillary: 133 mg/dL — ABNORMAL HIGH (ref 70–99)
Glucose-Capillary: 194 mg/dL — ABNORMAL HIGH (ref 70–99)
Glucose-Capillary: 224 mg/dL — ABNORMAL HIGH (ref 70–99)
Glucose-Capillary: 130 mg/dL — ABNORMAL HIGH (ref 70–99)
Glucose-Capillary: 127 mg/dL — ABNORMAL HIGH (ref 70–99)

## 2018-12-01 MED ORDER — METFORMIN HCL 500 MG PO TABS
250.0000 mg | ORAL_TABLET | Freq: Two times a day (BID) | ORAL | Status: DC
  Administered 2018-12-01 – 2018-12-10 (×18): 250 mg via ORAL
  Filled 2018-12-01 (×18): qty 1

## 2018-12-01 NOTE — Progress Notes (Signed)
[place] PHYSICAL MEDICINE & REHABILITATION PROGRESS NOTE   Subjective/Complaints: Up eating breakfast. States he didn't sleep that well. Does feel that he's doing better  ROS: Limited due to cognitive/behavioral    Objective:   No results found. No results for input(s): WBC, HGB, HCT, PLT in the last 72 hours. Recent Labs    12/01/18 0641  NA 140  K 3.9  CL 103  CO2 27  GLUCOSE 135*  BUN 24*  CREATININE 1.42*  CALCIUM 8.8*    Intake/Output Summary (Last 24 hours) at 12/01/2018 1041 Last data filed at 12/01/2018 0846 Gross per 24 hour  Intake 360 ml  Output 550 ml  Net -190 ml     Physical Exam: Vital Signs Blood pressure (!) 148/88, pulse 71, temperature 98.1 F (36.7 C), resp. rate 18, height 5\' 11"  (1.803 m), weight 98.1 kg, SpO2 93 %. Constitutional: No distress . Vital signs reviewed. HEENT: EOMI, oral membranes moist Neck: supple Cardiovascular: RRR without murmur. No JVD    Respiratory: CTA Bilaterally without wheezes or rales. Normal effort    GI: BS +, non-tender, non-distended  Musculoskeletal:  Comments: Pt with tender midfoot and ankles especially left lateral with palpation Neurological: He is alert. Oriented to self. Knew he was in [place] in medical place. Moves all 4 limbs but still a little slower moving feet d/t pain Skin: Skin iswarmand dry.  Psychiatric: Confused. A little more able to focus.     Assessment/Plan: 1. Functional deficits secondary to recent right frontal ICH/seizures and resulting encephalopathy which require 3+ hours per day of interdisciplinary therapy in a comprehensive inpatient rehab setting.  Physiatrist is providing close team supervision and 24 hour management of active medical problems listed below.  Physiatrist and rehab team continue to assess barriers to discharge/monitor patient progress toward functional and medical goals  Care Tool:  Bathing    Body parts bathed by patient: Right arm, Right lower  leg, Left arm, Left lower leg, Chest, Abdomen, Face, Front perineal area, Right upper leg, Left upper leg, Buttocks   Body parts bathed by helper: Buttocks     Bathing assist Assist Level: Minimal Assistance - Patient > 75%     Upper Body Dressing/Undressing Upper body dressing   What is the patient wearing?: Pull over shirt    Upper body assist Assist Level: Supervision/Verbal cueing    Lower Body Dressing/Undressing Lower body dressing      What is the patient wearing?: Incontinence brief     Lower body assist Assist for lower body dressing: Moderate Assistance - Patient 50 - 74%     Toileting Toileting    Toileting assist Assist for toileting: Contact Guard/Touching assist(urinal)     Transfers Chair/bed transfer  Transfers assist  Chair/bed transfer activity did not occur: Safety/medical concerns  Chair/bed transfer assist level: Minimal Assistance - Patient > 75%     Locomotion Ambulation   Ambulation assist   Ambulation activity did not occur: Safety/medical concerns  Assist level: Minimal Assistance - Patient > 75% Assistive device: Walker-rolling Max distance: 20 ft   Walk 10 feet activity   Assist  Walk 10 feet activity did not occur: Safety/medical concerns  Assist level: Minimal Assistance - Patient > 75% Assistive device: Walker-rolling   Walk 50 feet activity   Assist Walk 50 feet with 2 turns activity did not occur: Safety/medical concerns  Assist level: Moderate Assistance - Patient - 50 - 74%(min except mod for turning) Assistive device: Walker-rolling    Walk 150  feet activity   Assist Walk 150 feet activity did not occur: Safety/medical concerns         Walk 10 feet on uneven surface  activity   Assist Walk 10 feet on uneven surfaces activity did not occur: Safety/medical concerns         Wheelchair     Assist Will patient use wheelchair at discharge?: No Type of Wheelchair: Manual    Wheelchair assist  level: Supervision/Verbal cueing Max wheelchair distance: 26'    Wheelchair 50 feet with 2 turns activity    Assist        Assist Level: Supervision/Verbal cueing   Wheelchair 150 feet activity     Assist      Assist Level: Minimal Assistance - Patient > 75%   Blood pressure (!) 148/88, pulse 71, temperature 98.1 F (36.7 C), resp. rate 18, height 5\' 11"  (1.803 m), weight 98.1 kg, SpO2 93 %.  Medical Problem List and Plan: 1.Decreased functional ability with altered mental statussecondary to recent right frontal ICH with seizureand associated encephalopathy ---Continue CIR therapies including PT, OT, and SLP  2. Antithrombotics: -DVT/anticoagulation:SCD.Eliquis on hold due to [place]. -antiplatelet therapy: N/A 3. Pain Management:Hydrocodone 1 tablet every 6 hours as needed moderate pain  -brief prednisone taper for gout flare in feet---completed  -think he can work thru discomfort right now 4. Mood:Provide emotional support -antipsychotic agents: Seroquel 25 mg nightlywith backup dose -continue sleep chart 5. Neuropsych: This patientis notcapable of making decisions on hisown behalf. 6. Skin/Wound Care:Routine skin checks 7. Fluids/Electrolytes/Nutrition:encourage PO  -push fluids  -I personally reviewed the patient's labs today.  K+ 3.9 8. Atrial fibrillation. No longer on anticoagulation due to [place]. Continue amiodarone 200 mg daily. Follow-up cardiology service Dr. Harrington Challenger. HR controlled in 70's currently 9. Seizure disorder. EEG negative. Keppra changed to Depakote500 mg every 12 hours 10. Chronic diastolic congestive heart failure.Coreg 12.5 mg twice daily, Cozaar 25 mg daily. Monitor for any signs of fluid overload - daily weights   Filed Weights   11/28/18 0627 11/30/18 0556 12/01/18 0444  Weight: 99.9 kg 99.3 kg 98.1 kg    11. CKD stage III. Creatinine  baseline 1.62.  Cr 1.65 today 12. COPD/CPAP. Follow-up Dr. Halford Chessman. Continue Dulera  -IS/OOB 13. Type 2 diabetes mellitus. Hemoglobin A1c 6.9. SSI. Check blood sugars before meals and at bedtime. Patient on Glucophage 1000 mg twice daily prior to admission.   -cbg's borderline -resume glucophage 250mg  bid 14. History of ascending thoracic aortic aneurysm. Follow-up as outpatient 15. Hyperlipidemia. Lipitor    LOS: 4 days A FACE TO FACE EVALUATION WAS PERFORMED  Meredith Staggers 12/01/2018, 10:41 AM

## 2018-12-01 NOTE — Progress Notes (Signed)
Speech Language Pathology Daily Session Note  Patient Details  Name: Ronald [name] MRN: [id number] Date of Birth: 11/12/1945  Today's Date: 12/01/2018 SLP Individual Time: 0800-0900 SLP Individual Time Calculation (min): 60 min  Short Term Goals: Week 1: SLP Short Term Goal 1 (Week 1): Pt will consistently demonstrate Ox4 with MaxA verbal and visual cues SLP Short Term Goal 2 (Week 1): Pt will attend to basic familiar tasks for 10 minute intervals with ModA verbal cues for redirection. SLP Short Term Goal 3 (Week 1): Pt will recall new daily information with ModA multimodal cues SLP Short Term Goal 4 (Week 1): Pt will demonstrate functional problem solving for basic and familiar tasks with ModA multimodal cues.  Skilled Therapeutic Interventions: Skilled treatment session focused on cognitive goals. SLP facilitated session by providing Max A verbal and visual cues for patient to utilize schedule to anticipate upcoming therapy appointments. Patient requested to transfer to the wheelchair but required Mod verbal cues to donn shirt correctly and for safety with transfer. Patient performed basic self-care tasks at the sink with Mod I and extra time. SLP also facilitated session by providing total A for recall of his current medications and their functions with medication management task unable to be completed due to time constraints. Patient left upright in wheelchair with alarm on and all needs within reach. Continue with current plan of care.      Pain Pain Assessment Pain Scale: 0-10 Pain Score: 0-No pain  Therapy/Group: Individual Therapy  Jeily Guthridge 12/01/2018, 10:25 AM

## 2018-12-01 NOTE — Progress Notes (Signed)
Physical Therapy Session Note  Patient Details  Name: Ronald [name] MRN: [id number] Date of Birth: 1945/12/13  Today's Date: 12/01/2018 PT Individual Time: 0904-1000 PT Individual Time Calculation (min): 56 min   Short Term Goals: Week 1:  PT Short Term Goal 1 (Week 1): Pt will ambulate 50 ft with LRAD & min assist. PT Short Term Goal 2 (Week 1): Pt will consistently complete transfers with min assist & LRAD. PT Short Term Goal 3 (Week 1): Pt will negotiate 4 steps with B rails & mod assist.  Skilled Therapeutic Interventions/Progress Updates:  Pt received in w/c & agreeable to tx. Pt very distracted by his telephone during beginning of session but willing to put it in pocket when therapist asked him to do so. transported pt to/from gym via w/c dependent assist for time management. Pt transfers sit<>stand with min assist with cuing for increased eccentric control when lowering as pt plops in seat. Pt ambulates 20 ft x 2 with RW & min assist with gait distance limited by BLE and decreased weight shifting to L observed. Pt engaged in dynavision 2 minutes with 1 UE support & min assist + 2 minutes without BUE support & min assist + 2 minutes pressing only green/no red lights with 1UE support and min assist with task focusing on standing tolerance, BLE strengthening, standing balance, and cognitive task. During last trial of dynavision pt was able to accurately press 10/14 green lights and accurately did not press any red lights. Pt utilized nu-step on level 3 x 10 minutes with all four extremities with task focusing on coordination of reciprocal movement, endurance training & global strengthening, and sustained attention to task. At end of session pt left in w/c with chair alarm donned & call bell in reach.   Pt oriented to "rehab" but not location as he still reports he's in "[place]" but pt oriented to date today.    Therapy Documentation Precautions:  Precautions Precautions:  Fall Restrictions Weight Bearing Restrictions: No   Pain: Pt c/o unrated B "gout" pain, L>R & pain meds requested & RN administered during session.     Therapy/Group: Individual Therapy  [name] 12/01/2018, 10:01 AM

## 2018-12-01 NOTE — Progress Notes (Signed)
Patient wife presented some concerns to staff about patient's increased confusion and decline within the last two weeks. Wife had concerns of a UTI, patient being septic, and constant foot pain the patient is having to the BLE and swelling to the LLE. Wife was notified that a urinalysis was collected on 11/30/18. The urinalysis was abnormal and we are awaiting culture results. Dan A. PA was notified of foot pain and stated he would look over the patient's chart and talk to the wife to address concerns. Charge Nurse notified of issue and also spoke to wife about concerns. Staff explained to wife that we are in a working progress to figure out her concerns. Patient is been given PRN medications for bilateral foot pain and given instructions to keep the LLE elevated as much as possible to help with swelling. Will continue to assess and monitor. Wife left unit with no other concerns and understanding of information given by staff today.

## 2018-12-01 NOTE — Progress Notes (Signed)
Occupational Therapy Session Note  Patient Details  Name: Ronald [name] MRN: [id number] Date of Birth: October 21, 1945  Today's Date: 12/01/2018 OT Individual Time: 1415-1530 OT Individual Time Calculation (min): 75 min    Short Term Goals: Week 1:  OT Short Term Goal 1 (Week 1): Pt will stand with CGA to complete 2 grooming tasks in order to increase functional standing tolerance OT Short Term Goal 2 (Week 1): Pt will maintain attention to full bathing task with no more than 2 VCs for redirection OT Short Term Goal 3 (Week 1): Pt will ambulate into bathroom for toileting task with min A using LRAD in prep for home mobility OT Short Term Goal 4 (Week 1): Pt will consistently completed sit>stand with min A using LRAD  Skilled Therapeutic Interventions/Progress Updates:    Pt received supine agreeable to therapy. Pt vaguely c/o pain in his L foot while supine, upon transitioning to sitting, it became apparent this pain increases with tactile input and weightbearing, and the foot had obvious edema. Shower and TEDs provided as pain management. Pt completed stand pivot transfer to w/c with moderate cueing for technique and mod A overall. Pt completed shaving task at sink with electric razor, with (S) provided throughout to ensure safety. Pt was then taken into bathroom where he completed stand pivot transfer onto the [facility] in the walk in shower (facing out). Pt required mod A to doff clothing. Pt completed UB and LB bathing seated with (S). Min A to stand to wash peri areas. Pt transferred back to w/c with min A, mod cueing for grab bar use and UE placement in general. Pt donned shirt with (S). Min A to don pants. Teds were donned with increased time d/t breaks needed for pain. Pt was left sitting up in the w/c with all needs met, telesitter on.   Therapy Documentation Precautions:  Precautions Precautions: Fall Restrictions Weight Bearing Restrictions: No   Therapy/Group: Individual  Therapy  [name] 12/01/2018, 7:27 AM

## 2018-12-02 ENCOUNTER — Inpatient Hospital Stay (HOSPITAL_COMMUNITY): Payer: Medicare Other

## 2018-12-02 ENCOUNTER — Inpatient Hospital Stay (HOSPITAL_COMMUNITY): Payer: Federal, State, Local not specified - PPO

## 2018-12-02 ENCOUNTER — Encounter (HOSPITAL_COMMUNITY): Payer: Federal, State, Local not specified - PPO | Admitting: Psychology

## 2018-12-02 ENCOUNTER — Inpatient Hospital Stay (HOSPITAL_COMMUNITY): Payer: Federal, State, Local not specified - PPO | Admitting: Rehabilitation

## 2018-12-02 ENCOUNTER — Ambulatory Visit: Payer: Medicare Other | Admitting: Occupational Therapy

## 2018-12-02 ENCOUNTER — Inpatient Hospital Stay (HOSPITAL_COMMUNITY): Payer: Federal, State, Local not specified - PPO | Admitting: Speech Pathology

## 2018-12-02 DIAGNOSIS — I610 Nontraumatic intracerebral hemorrhage in hemisphere, subcortical: Secondary | ICD-10-CM

## 2018-12-02 DIAGNOSIS — E1165 Type 2 diabetes mellitus with hyperglycemia: Secondary | ICD-10-CM

## 2018-12-02 DIAGNOSIS — M1009 Idiopathic gout, multiple sites: Secondary | ICD-10-CM

## 2018-12-02 LAB — URINALYSIS, COMPLETE (UACMP) WITH MICROSCOPIC
Bilirubin Urine: NEGATIVE
Glucose, UA: 150 mg/dL — AB
Ketones, ur: 5 mg/dL — AB
Nitrite: NEGATIVE
Protein, ur: 100 mg/dL — AB
RBC / HPF: >50 RBC/hpf — ABNORMAL HIGH (ref 0–5)
Specific Gravity, Urine: 1.02 (ref 1.005–1.030)
WBC, UA: >50 WBC/hpf — ABNORMAL HIGH (ref 0–5)
pH: 7 (ref 5.0–8.0)

## 2018-12-02 LAB — GLUCOSE, CAPILLARY
Glucose-Capillary: 178 mg/dL — ABNORMAL HIGH (ref 70–99)
Glucose-Capillary: 170 mg/dL — ABNORMAL HIGH (ref 70–99)
Glucose-Capillary: 150 mg/dL — ABNORMAL HIGH (ref 70–99)
Glucose-Capillary: 141 mg/dL — ABNORMAL HIGH (ref 70–99)
Glucose-Capillary: 182 mg/dL — ABNORMAL HIGH (ref 70–99)
Glucose-Capillary: 198 mg/dL — ABNORMAL HIGH (ref 70–99)
Glucose-Capillary: 252 mg/dL — ABNORMAL HIGH (ref 70–99)

## 2018-12-02 MED ORDER — DICLOFENAC SODIUM 1 % TD GEL
2.0000 g | Freq: Three times a day (TID) | TRANSDERMAL | Status: DC
  Administered 2018-12-02 – 2018-12-08 (×19): 2 g via TOPICAL
  Filled 2018-12-02: qty 100

## 2018-12-02 MED ORDER — PREDNISONE 20 MG PO TABS
20.0000 mg | ORAL_TABLET | Freq: Two times a day (BID) | ORAL | Status: AC
  Administered 2018-12-02 – 2018-12-04 (×6): 20 mg via ORAL
  Filled 2018-12-02 (×6): qty 1

## 2018-12-02 NOTE — Progress Notes (Signed)
Tele-sitter notified CN concerning monitoring system down and will re-notify CN when monitoring system is back up, CN alerted staff, patient assessed.Ronald [name]

## 2018-12-02 NOTE — Progress Notes (Signed)
Speech Language Pathology Daily Session Note  Patient Details  Name: Ronald [name] MRN: [id number] Date of Birth: 06/15/1945  Today's Date: 12/02/2018 SLP Individual Time: 0725-0820 SLP Individual Time Calculation (min): 55 min  Short Term Goals: Week 1: SLP Short Term Goal 1 (Week 1): Pt will consistently demonstrate Ox4 with MaxA verbal and visual cues SLP Short Term Goal 2 (Week 1): Pt will attend to basic familiar tasks for 10 minute intervals with ModA verbal cues for redirection. SLP Short Term Goal 3 (Week 1): Pt will recall new daily information with ModA multimodal cues SLP Short Term Goal 4 (Week 1): Pt will demonstrate functional problem solving for basic and familiar tasks with ModA multimodal cues.  Skilled Therapeutic Interventions: Skilled treatment session focused on cognitive goals. SLP facilitated session by providing extra time and Mod A verbal cues for problem solving while attempting to sit EOB. Patient with severe pain in his feet with difficulty even resting them on the side table. Patient consumed his breakfast meal with Mod I for problem solving and attention but required total A for orientation to place and time despite external aids. Patient with increased confusion today during conversation with word-finding difficulty and delayed processing. Patient declining to get out of bed at this time due to lethargy and pain. RN aware. Patient left supine in bed with alarm on and all needs within reach. Continue with current plan of care.      Pain No/Denies Pain   Therapy/Group: Individual Therapy  Cletus Mehlhoff 12/02/2018, 8:21 AM

## 2018-12-02 NOTE — Progress Notes (Signed)
Physical Therapy Session Note  Patient Details  Name: Ronald [name] MRN: [id number] Date of Birth: [birth year]  Today's Date: 12/02/2018 PT Individual Time: 1100-1120 PT Individual Time Calculation (min): 20 min   Short Term Goals: Week 1:  PT Short Term Goal 1 (Week 1): Pt will ambulate 50 ft with LRAD & min assist. PT Short Term Goal 2 (Week 1): Pt will consistently complete transfers with min assist & LRAD. PT Short Term Goal 3 (Week 1): Pt will negotiate 4 steps with B rails & mod assist.  Skilled Therapeutic Interventions/Progress Updates:   Pt received in bed following xray.  RN reports they do not expect to find injury and expect that it is likely gout, therefore can proceed as pt able.  Pt continued to be very confused during session, not oriented to being in hospital/situation, or to date.  Pt also saying he had not seen me since "first day" despite PT cuing pt that she has never met pt before.  RN in room to provide pt with Prednisone and Voltaren gel to B feet.  PT assisted with donning non-skid socks.  Pt able to get to EOB at S level with HOB elevated and with use of bed rails.  Pt had increased pain in B feet (L>R) but was agreeable to stand and pull up shorts (shorts donned by PT for time management, dependent).  Total A to pull up shorts as pt unable to let go of RW due to pain.  Sit<>stand from elevated bed at mod/max A level (+2 for safety). Pt sat quickly due to pain.  Pt was then agreeable to transfer to recliner.  Performed stand pivot transfer with use of RW with mod/max A (again +2 for safety).  Pt left in recliner with all needs in reach, seat belt alarm set.    Therapy Documentation Precautions:  Precautions Precautions: Fall Restrictions Weight Bearing Restrictions: No General: PT Amount of Missed Time (min): 40 Minutes PT Missed Treatment Reason: Xray;Pain Pain: Pain Assessment Pain Scale: 0-10 Pain Score: 5  Pain Type: Acute pain Pain Location: Foot Pain  Orientation: Right;Left Pain Descriptors / Indicators: Aching;Burning Pain Onset: Gradual Pain Intervention(s): Medication (See eMAR) Pain was higher when standing.    Therapy/Group: Individual Therapy  Denice Bors 12/02/2018, 11:25 AM

## 2018-12-02 NOTE — Progress Notes (Signed)
[place] PHYSICAL MEDICINE & REHABILITATION PROGRESS NOTE   Subjective/Complaints: Pt remains confused. Both feet continue to be painful. Left more than right   ROS: Limited due to cognitive/behavioral    Objective:   No results found. No results for input(s): WBC, HGB, HCT, PLT in the last 72 hours. Recent Labs    12/01/18 0641  NA 140  K 3.9  CL 103  CO2 27  GLUCOSE 135*  BUN 24*  CREATININE 1.42*  CALCIUM 8.8*    Intake/Output Summary (Last 24 hours) at 12/02/2018 0903 Last data filed at 12/02/2018 0820 Gross per 24 hour  Intake 600 ml  Output 400 ml  Net 200 ml     Physical Exam: Vital Signs Blood pressure (!) 157/86, pulse 81, temperature 98 F (36.7 C), resp. rate 18, height 5\' 11"  (1.803 m), weight 98.2 kg, SpO2 94 %. Constitutional: No distress . Vital signs reviewed. HEENT: EOMI, oral membranes moist Neck: supple Cardiovascular: RRR without murmur. No JVD    Respiratory: CTA Bilaterally without wheezes or rales. Normal effort    GI: BS +, non-tender, non-distended  Musculoskeletal:  Comments:  tender midfoot and ankles especially left lateral with palpation Neurological: He is alert. Oriented to self. Distracted.  Moves all 4 limbs but still a little slower moving feet d/t pain Skin: Skin iswarmand dry.  Psychiatric: Confused.  .     Assessment/Plan: 1. Functional deficits secondary to recent right frontal ICH/seizures and resulting encephalopathy which require 3+ hours per day of interdisciplinary therapy in a comprehensive inpatient rehab setting.  Physiatrist is providing close team supervision and 24 hour management of active medical problems listed below.  Physiatrist and rehab team continue to assess barriers to discharge/monitor patient progress toward functional and medical goals  Care Tool:  Bathing    Body parts bathed by patient: Right arm, Right lower leg, Left arm, Left lower leg, Chest, Abdomen, Face, Front perineal  area, Right upper leg, Left upper leg, Buttocks   Body parts bathed by helper: Buttocks     Bathing assist Assist Level: Minimal Assistance - Patient > 75%     Upper Body Dressing/Undressing Upper body dressing   What is the patient wearing?: Pull over shirt    Upper body assist Assist Level: Supervision/Verbal cueing    Lower Body Dressing/Undressing Lower body dressing      What is the patient wearing?: Incontinence brief, Pants     Lower body assist Assist for lower body dressing: Minimal Assistance - Patient > 75%     Toileting Toileting    Toileting assist Assist for toileting: Contact Guard/Touching assist(urinal)     Transfers Chair/bed transfer  Transfers assist  Chair/bed transfer activity did not occur: Safety/medical concerns  Chair/bed transfer assist level: Minimal Assistance - Patient > 75%     Locomotion Ambulation   Ambulation assist   Ambulation activity did not occur: Safety/medical concerns  Assist level: Minimal Assistance - Patient > 75% Assistive device: Walker-rolling Max distance: 20 ft   Walk 10 feet activity   Assist  Walk 10 feet activity did not occur: Safety/medical concerns  Assist level: Minimal Assistance - Patient > 75% Assistive device: Walker-rolling   Walk 50 feet activity   Assist Walk 50 feet with 2 turns activity did not occur: Safety/medical concerns  Assist level: Moderate Assistance - Patient - 50 - 74%(min except mod for turning) Assistive device: Walker-rolling    Walk 150 feet activity   Assist Walk 150 feet activity did not occur:  Safety/medical concerns         Walk 10 feet on uneven surface  activity   Assist Walk 10 feet on uneven surfaces activity did not occur: Safety/medical concerns         Wheelchair     Assist Will patient use wheelchair at discharge?: No Type of Wheelchair: Manual    Wheelchair assist level: Supervision/Verbal cueing Max wheelchair distance: 66'     Wheelchair 50 feet with 2 turns activity    Assist        Assist Level: Supervision/Verbal cueing   Wheelchair 150 feet activity     Assist      Assist Level: Minimal Assistance - Patient > 75%   Blood pressure (!) 157/86, pulse 81, temperature 98 F (36.7 C), resp. rate 18, height 5\' 11"  (1.803 m), weight 98.2 kg, SpO2 94 %.  Medical Problem List and Plan: 1.Decreased functional ability with altered mental statussecondary to recent right frontal ICH with seizureand associated encephalopathy ---Continue CIR therapies including PT, OT, and SLP   -team conf today  -check ua,ucx given ongoing confusion which waxes and wanes 2. Antithrombotics: -DVT/anticoagulation:SCD.Eliquis on hold due to [place]. -antiplatelet therapy: N/A 3. Pain Management:Hydrocodone 1 tablet every 6 hours as needed moderate pain  -feet remain barriers and distractions  -resume prednisone  -voltaren gel   -check xrays of both feet 4. Mood:Provide emotional support -antipsychotic agents: Seroquel 25 mg nightlywith backup dose -continue sleep chart 5. Neuropsych: This patientis notcapable of making decisions on hisown behalf. 6. Skin/Wound Care:Routine skin checks 7. Fluids/Electrolytes/Nutrition:encourage PO  -push fluids   K+ 3.9 8. Atrial fibrillation. No longer on anticoagulation due to [place]. Continue amiodarone 200 mg daily. Follow-up cardiology service Dr. Harrington Challenger. HR controlled in 70's currently 9. Seizure disorder. EEG negative. Keppra changed to Depakote500 mg every 12 hours 10. Chronic diastolic congestive heart failure.Coreg 12.5 mg twice daily, Cozaar 25 mg daily. Monitor for any signs of fluid overload - daily weights stable   Filed Weights   11/30/18 0556 12/01/18 0444 12/02/18 0510  Weight: 99.3 kg 98.1 kg 98.2 kg    11. CKD stage III. Creatinine baseline 1.62.  Cr 1.65   8/24 12. COPD/CPAP. Follow-up Dr. Halford Chessman. Continue Dulera  -IS/OOB 13. Type 2 diabetes mellitus. Hemoglobin A1c 6.9. SSI. Check blood sugars before meals and at bedtime. Patient on Glucophage 1000 mg twice daily prior to admission.   -cbg's ok -resumed glucophage 250mg  bid 14. History of ascending thoracic aortic aneurysm. Follow-up as outpatient 15. Hyperlipidemia. Lipitor    LOS: 5 days A FACE TO FACE EVALUATION WAS PERFORMED  Meredith Staggers 12/02/2018, 9:03 AM

## 2018-12-02 NOTE — Progress Notes (Signed)
Occupational Therapy Session Note  Patient Details  Name: Ronald [name] MRN: [id number] Date of Birth: 02/12/1946  Today's Date: 12/02/2018 OT Individual Time: 1500-1610 OT Individual Time Calculation (min): 70 min    Short Term Goals: Week 1:  OT Short Term Goal 1 (Week 1): Pt will stand with CGA to complete 2 grooming tasks in order to increase functional standing tolerance OT Short Term Goal 2 (Week 1): Pt will maintain attention to full bathing task with no more than 2 VCs for redirection OT Short Term Goal 3 (Week 1): Pt will ambulate into bathroom for toileting task with min A using LRAD in prep for home mobility OT Short Term Goal 4 (Week 1): Pt will consistently completed sit>stand with min A using LRAD  Skilled Therapeutic Interventions/Progress Updates:     Pt received supine with wife exiting the room upon therapist entry. Pt transitioned to sitting EOB with min A. Throughout session pt requiring max reorientation cues, thinking this OT was family member. Attempted to stand with pt however L foot pain was too high and pt completed squat pivot transfer to w/c with mod A instead. Pt sat at sink and completed grooming tasks with set up assist. Pt able to stand with mod A from w/c and change soiled brief (urine incontinence). Pt able to wash peri areas sit <> stand with mod A for standing balance. Pt's swollen, painful L foot was placed in an ice bath for 4 5 second dunks. Pt reported relief following completion. Pt was taken to therapy gym where he completed stand pivot transfer to mat. Pt immediately transitioned into supine on mat despite cueing to remain seated. Pt completed BUE strengthening circuit with a 4 lb dowel to focus on BUE muscles needed to push up from w/c. Pt returned to EOM and attempted to complete peg board with a near copy, but was unable with max cueing. Pt returned to room and was left sitting up in the recliner with all needs met, chair alarm set.   Therapy  Documentation Precautions:  Precautions Precautions: Fall Restrictions Weight Bearing Restrictions: No   Therapy/Group: Individual Therapy  [name] 12/02/2018, 7:16 AM

## 2018-12-02 NOTE — Consult Note (Signed)
Neuropsychological Consultation   Patient:   Ronald [name]   DOB:   [birth year]  MR Number:  [id number]  Location:  [place] [address] [place] [id number] [place] [address] Dept: [place]: [phone]           Date of Service:   12/02/2018  Start Time:   2 PM End Time:   3 PM  Provider/Observer:  Ilean Skill, Psy.D.       Clinical Neuropsychologist       Billing Code/Service: [id number]  Chief Complaint:    Ronald [name] is a 72 year old male with history for right frontal ICH with admission on 10/21/2018 for ICH.  History of Afib, hypertension, chronic diastolic CHF, ascending thoracic aortic aneurysm, COPD/CPAP, chronic kidney disease, diabetes and seizure.  Presented on 8/15 with increased altered mental status and poor balance.  Cranial CT showed no acute or new ICH.  MRI also showed no acute finding or new abnormality.  EEG negative for seizure.  Patient with continued bouts of confusion and restlessness.  Patient appeared to wax and wane in awareness and mental status during the 45 minute interaction with me today.   Reason for Service:  Patient was referred for neuropsychological consultation due to coping/adjustment and assess cognition.  Below is the HPI for the current admission.    Ronald L. Wilborneis a 73 year old right-handed male with history significant for right frontal ICH related to Eliquis and admitted 10/21/2018 until 10/27/2018 and discharged to home ambulating 240 feet without assistive device, atrial fibrillationfollowed by Dr. Dorris Carnes andno longer on anticoagulation, hypertension, chronic diastolic congestive heart failure,history of ascending thoracic aortic aneurysm followed as an outpatient.,COPDmaintained on CPAP and followed by Dr. Halford Chessman, chronic kidney disease stage IIIwith creatinine 1.62, type 2 diabetes mellitus and seizuremaintained on Keppra 500 mg twice  daily. Per chart review patient lives with spouse. Two-level home with bed and bathroom on Main level. Reportedly independent prior to admission. Presented 11/22/2018 with increased altered mental status and poor balance leaning to the right side. Urinalysis negative, COVID negative. Cranial CT scan showed no acute or new intracranial hemorrhage. Decreasing conspicuityof the right frontal hemorrhage and slight decrease in associated edema. CT angiogram of head and neck with no emergent large vessel occlusion. MRI of the brain completed 11/23/2018 again showing no acute findings or new abnormality. Prior subacute hematoma measuring 4.2 x 2 cm. No significant adjacent edema or mass-effect. EEG negative for seizure. Most recent echocardiogram after initial [place] 10/22/2018 with ejection fraction of 60% hyperdynamic systolic function.Neurology follow-up patient's Keppra was changed to Depakote 500 mg twice daily. Still with bouts of confusion and restlessness and maintained on Seroquel. Tolerating a regular diet. Therapy evaluations completed and patient was admitted for a comprehensive rehab program.  Current Status:  The patient was initially able to show orientation to self, place and situation.  He did turn to his wife to clarify statements and check himself.  As visit progressed the patient had more significant word finding issues and difficulty with attention and orientation.  Displayed circumlocutions and paraphasic errors.  Was able to give clear past history but details of events over past several weeks were less clear.  Mental status decreased as the conversation progressed.  No outward indication of seizure or other acute neurologic event.  Behavioral Observation: Ronald [name]  presents as a 73 y.o.-year-old Right Caucasian Male who appeared his stated age. his dress was Appropriate and  he was Well Groomed and his manners were Appropriate to the situation.  his participation was  indicative of Appropriate and Inattentive behaviors.  There were any physical disabilities noted.  he displayed an appropriate level of cooperation and motivation.     Interactions:    Active Appropriate and Inattentive  Attention:   abnormal and attention span appeared shorter than expected for age  Memory:   abnormal; remote memory intact, recent memory impaired  Visuo-spatial:  not examined  Speech (Volume):  low  Speech:   Word finding and fluency issues.  Thought Process:  Coherent and Tangential  Though Content:  WNL; not suicidal and not homicidal  Orientation:   person, place and situation  Judgment:   Fair  Planning:   Poor  Affect:    Appropriate  Mood:    Dysphoric  Insight:   Shallow  Intelligence:   high  Medical History:   Past Medical History:  Diagnosis Date  . Allergic rhinitis   . Anticoagulant long-term use    eliquis  . Cardiomyopathy due to systemic disease [facility])    followed by dr harding  . COPD with emphysema [facility])    pulmologist-  dr Halford Chessman  . Dyspnea    occasional per pt  . History of colon polyps    tubular adenoma 2013  . History of gout    09-05-2017 last flare-up  05/ 2019 3 wks ago, feet  . History of sepsis 02/18/2017   per d/c note probable uti, acute chf, acute renal failure, hypoxia  . Hyperlipidemia   . Hyperplasia of prostate with lower urinary tract symptoms (LUTS)   . Hypertension   . OSA on CPAP    per study 08-03-2004  Severe OSA  . Persistent atrial fibrillation    cardiologist --  dr Dorris Carnes--  post cardioversion 05-07-2014  . Prostate cancer [facility]) urologsit-  dr Alyson Ingles--- as of 05-21-2017 per pt last PSA 11 approx.   Dx  2014--  stage T1c, Gleason 3+3=6, PSA 6.67--  Active surveillance/  04/ 2019  Stage T1b, Gleason 3+4, PSA 12.8- plan external radiation therapy    . 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 Seasonal allergies   . Sigmoid diverticulosis    . Systolic and diastolic CHF, chronic [facility])    cardiologist-  [name]  . Tinea versicolor   . Type 2 diabetes mellitus ([place])   . Wears hearing aid in both ears      Psychiatric History:  No prior psychiatric history.  Family Med/Psych History:  Family History  Problem Relation Age of Onset  . Breast cancer Mother   . Stroke Other        Unknown   . Ovarian cancer Sister   . Colon cancer Neg Hx   . Esophageal cancer Neg Hx   . Rectal cancer Neg Hx   . Stomach cancer Neg Hx     Impression/DX:  Shaquil Mccallen is a 73 year old male with history for right frontal ICH with admission on 10/21/2018 for ICH.  History of Afib, hypertension, chronic diastolic CHF, ascending thoracic aortic aneurysm, COPD/CPAP, chronic kidney disease, diabetes and seizure.  Presented on 8/15 with increased altered mental status and poor balance.  Cranial CT showed no acute or new ICH.  MRI also showed no acute finding or new abnormality.  EEG negative for seizure.  Patient with continued bouts of confusion and restlessness.  Patient appeared to wax  and wane in awareness and mental status during the 45 minute interaction with me today.   The patient was initially able to show orientation to self, place and situation.  He did turn to his wife to clarify statements and check himself.  As visit progressed the patient had more significant word finding issues and difficulty with attention and orientation.  Displayed circumlocutions and paraphasic errors.  Was able to give clear past history but details of events over past several weeks were less clear.  Mental status decreased as the conversation progressed.  No outward indication of seizure or other acute neurologic event.  What is producing the current acute changes in cognition is hard to give clear explanation.  Cognitive decline with fatigue and arousal levels.    Disposition/Plan:  Will follow up with patient.         Electronically  Signed   _______________________ Ilean Skill, Psy.D.

## 2018-12-02 NOTE — Progress Notes (Signed)
Pt. Refused cpap. RT informed pt. To notify if he changes his mind. 

## 2018-12-02 NOTE — Plan of Care (Signed)
  Problem: Consults Goal: RH GENERAL PATIENT EDUCATION Description: See Patient Education module for education specifics. Outcome: Progressing   Problem: RH BLADDER ELIMINATION Goal: RH STG MANAGE BLADDER WITH ASSISTANCE Description: STG Manage Bladder With  Min Assistance Outcome: Progressing   Problem: RH SAFETY Goal: RH STG ADHERE TO SAFETY PRECAUTIONS W/ASSISTANCE/DEVICE Description: STG Adhere to Safety Precautions With Cue and REmindwersAssistance/Device. Outcome: Progressing   Problem: RH KNOWLEDGE DEFICIT GENERAL Goal: RH STG INCREASE KNOWLEDGE OF SELF CARE AFTER HOSPITALIZATION Outcome: Progressing   Problem: RH PAIN MANAGEMENT Goal: RH STG PAIN MANAGED AT OR BELOW PT'S PAIN GOAL Description: <3/10 Outcome: Not Progressing

## 2018-12-03 ENCOUNTER — Inpatient Hospital Stay (HOSPITAL_COMMUNITY): Payer: Federal, State, Local not specified - PPO | Admitting: Speech Pathology

## 2018-12-03 ENCOUNTER — Inpatient Hospital Stay (HOSPITAL_COMMUNITY): Payer: Federal, State, Local not specified - PPO

## 2018-12-03 ENCOUNTER — Ambulatory Visit: Payer: Medicare Other | Admitting: Occupational Therapy

## 2018-12-03 ENCOUNTER — Ambulatory Visit: Payer: Medicare Other | Admitting: Physical Therapy

## 2018-12-03 LAB — GLUCOSE, CAPILLARY
Glucose-Capillary: 175 mg/dL — ABNORMAL HIGH (ref 70–99)
Glucose-Capillary: 160 mg/dL — ABNORMAL HIGH (ref 70–99)
Glucose-Capillary: 212 mg/dL — ABNORMAL HIGH (ref 70–99)
Glucose-Capillary: 215 mg/dL — ABNORMAL HIGH (ref 70–99)

## 2018-12-03 MED ORDER — CEPHALEXIN 250 MG PO CAPS
250.0000 mg | ORAL_CAPSULE | Freq: Three times a day (TID) | ORAL | Status: AC
  Administered 2018-12-03 – 2018-12-10 (×21): 250 mg via ORAL
  Filled 2018-12-03 (×21): qty 1

## 2018-12-03 NOTE — Progress Notes (Signed)
Occupational Therapy Session Note  Patient Details  Name: Ronald [name] MRN: [id number] Date of Birth: 09/20/1945  Today's Date: 12/03/2018 OT Individual Time: 5638-7564 OT Individual Time Calculation (min): 72 min    Short Term Goals: Week 1:  OT Short Term Goal 1 (Week 1): Pt will stand with CGA to complete 2 grooming tasks in order to increase functional standing tolerance OT Short Term Goal 2 (Week 1): Pt will maintain attention to full bathing task with no more than 2 VCs for redirection OT Short Term Goal 3 (Week 1): Pt will ambulate into bathroom for toileting task with min A using LRAD in prep for home mobility OT Short Term Goal 4 (Week 1): Pt will consistently completed sit>stand with min A using LRAD  Skilled Therapeutic Interventions/Progress Updates:    1:1. Pt received in bed reporting pain in B feet L>R but does not rate. Rest provided PRN and pt agreeable to bathign and dressing at shower level. Pt completes MIN A stand pivot transfers with heavy reliance on grab bar and bed rail to transfer EOB<>w/c<>BSC in shower. Pt completes bathing sit to stand with S and heavy use of bed rail. Pt requires min VC for sequenicing, recall of bathed body parts and termination of hair washing/rinsing. Pt dresses at sit to stand at sink with CGA for initial power up however up to MOD A for standing balance while advancing pants pasthips d/t posterior lean. MOD VC for anterior weight shifting of hips towards sink. Pt completes grooming seated in w/c with set up. Pt completes UB dressing at sink with set up. Pt completes 2x standing with RW with min-mod improving to CGA for standing balance while completing pipe tree activity with mod-max multimodal cuing to follow picture to built figure for spatial awareness, standing balance and cognition required for BADL. Exited session with pt seated in w/c, call light in reach and all needs met  Therapy Documentation Precautions:  Precautions Precautions:  Fall Restrictions Weight Bearing Restrictions: No General:   Vital Signs:  Pain:   ADL:   Vision   Perception    Praxis   Exercises:   Other Treatments:     Therapy/Group: Individual Therapy  Tonny Branch 12/03/2018, 8:47 AM

## 2018-12-03 NOTE — Progress Notes (Signed)
Speech Language Pathology Daily Session Note  Patient Details  Name: Ronald [name] MRN: [id number] Date of Birth: 1946-01-06  Today's Date: 12/03/2018 SLP Individual Time: 1255-1350 SLP Individual Time Calculation (min): 55 min  Short Term Goals: Week 1: SLP Short Term Goal 1 (Week 1): Pt will consistently demonstrate Ox4 with MaxA verbal and visual cues SLP Short Term Goal 2 (Week 1): Pt will attend to basic familiar tasks for 10 minute intervals with ModA verbal cues for redirection. SLP Short Term Goal 3 (Week 1): Pt will recall new daily information with ModA multimodal cues SLP Short Term Goal 4 (Week 1): Pt will demonstrate functional problem solving for basic and familiar tasks with ModA multimodal cues.  Skilled Therapeutic Interventions: Skilled treatment session focused on cognitive goals. Upon arrival, patient was asleep but easily awakened. Patient was disoriented to place and required extra time and Min A verbal cues to sit EOB. Patient required Max A verbal cues for problem solving (poured tea in cup of trash and using bed pad as a napkin) and for recall of orientation throughout meal (patient reported, I didn't know [employer] served spaghetti). Patient with significant language of confusion throughout session, therefore, SLP initiated use of a memory notebook to maximize orientation and recall of functional information. RN aware of confusion and addressing. Patient left supine in bed with alarm on and all needs within reach.      Pain No/Denies Pain   Therapy/Group: Individual Therapy  Rachel Samples 12/03/2018, 2:36 PM

## 2018-12-03 NOTE — Plan of Care (Signed)
  Problem: Consults Goal: RH GENERAL PATIENT EDUCATION Description: See Patient Education module for education specifics. Outcome: Progressing   Problem: RH BLADDER ELIMINATION Goal: RH STG MANAGE BLADDER WITH ASSISTANCE Description: STG Manage Bladder With  Min Assistance Outcome: Progressing   Problem: RH SAFETY Goal: RH STG ADHERE TO SAFETY PRECAUTIONS W/ASSISTANCE/DEVICE Description: STG Adhere to Safety Precautions With Cue and REmindwersAssistance/Device. Outcome: Progressing   Problem: RH PAIN MANAGEMENT Goal: RH STG PAIN MANAGED AT OR BELOW PT'S PAIN GOAL Description: <3/10 Outcome: Progressing   Problem: RH KNOWLEDGE DEFICIT GENERAL Goal: RH STG INCREASE KNOWLEDGE OF SELF CARE AFTER HOSPITALIZATION Outcome: Progressing

## 2018-12-03 NOTE — Progress Notes (Signed)
Pt. States he will notify if he decides to wear his cpap tonight.

## 2018-12-03 NOTE — Progress Notes (Signed)
Social Work Patient ID: [name], male   DOB: 1946-02-10, 73 y.o.   MRN: [id number]  Spoke with wife yesterday and today to review team conference.  She is aware and agreeable with targeted d/c date of 9/3 and supervision goals.  Discussed the team concerns of continued, significant cognitive impairment and some worsening beginning yesterday.  She has also seen this decline.  She is aware of suspicion for UTI and that this is being checked and likely plan to begin abx.  She is hopeful this will make the difference and he will rally back to prior cognitive status.  We discussed that a UTI may be responsible for this decline over past couple of days, however, treatment should not be expected to "fix" the entire cognitive picture.  Stressed to her that his cognitive decline may be a long term concern but will be monitored closely by MD and team.  Neuropsychology saw patient yesterday and will likely have him see pt again prior to d/c.  Continue to follow.  Doroteo Nickolson, LCSW

## 2018-12-03 NOTE — Progress Notes (Signed)
Ronald [name] PHYSICAL MEDICINE & REHABILITATION PROGRESS NOTE   Subjective/Complaints: Pt alert in bed. Says feet feel a little better. Reports his sleep is inconsistent however he is unable to reliably state  ROS: Limited due to cognitive/behavioral     Objective:   Dg Foot 2 Views Left  Result Date: 12/02/2018 CLINICAL DATA:  Bilateral foot pain.  History of gout. EXAM: LEFT FOOT - 2 VIEW COMPARISON:  No recent prior. FINDINGS: Soft tissue swelling noted. Calcific changes noted about the medial aspect of the distal left first metatarsal. This may be related to patient's history of gout. No bony erosions noted. No evidence of fracture. Mild peripheral vascular calcification cannot be excluded. IMPRESSION: Soft tissue swelling. Calcific changes in the soft tissues about the distal portion of the first metatarsal. These changes may be related to patient's history of gout. No acute bony abnormalities identified. No evidence of fracture or dislocation. Electronically Signed   By: Marcello Moores  Register   On: 12/02/2018 11:12   Dg Foot 2 Views Right  Result Date: 12/02/2018 CLINICAL DATA:  Bilateral foot pain. EXAM: RIGHT FOOT - 2 VIEW COMPARISON:  No recent prior. FINDINGS: Soft tissue swelling noted. Prominent degenerative change first MTP joint. Calcific densities noted along the medial and lateral base of the right first digit proximal phalanx. This may be related to the patient's history of gout. No bony erosive changes noted. No acute bony or joint abnormality identified. No evidence of fracture or dislocation. IMPRESSION: Calcific densities along the mediolateral base of the right first digit proximal phalanx. This may be related to the patient's history of gout. No bony erosions noted. No acute bony abnormality. No evidence of fracture dislocation. Electronically Signed   By: Marcello Moores  Register   On: 12/02/2018 11:44   No results for input(s): WBC, HGB, HCT, PLT in the last 72 hours. Recent Labs   12/01/18 0641  NA 140  K 3.9  CL 103  CO2 27  GLUCOSE 135*  BUN 24*  CREATININE 1.42*  CALCIUM 8.8*    Intake/Output Summary (Last 24 hours) at 12/03/2018 0849 Last data filed at 12/03/2018 0810 Gross per 24 hour  Intake 530 ml  Output 1475 ml  Net -945 ml     Physical Exam: Vital Signs Blood pressure (!) 141/84, pulse 72, temperature 98.2 F (36.8 C), resp. rate 18, height 5\' 11"  (1.803 m), weight 105.6 kg, SpO2 91 %. Constitutional: No distress . Vital signs reviewed. HEENT: EOMI, oral membranes moist Neck: supple Cardiovascular: RRR without murmur. No JVD    Respiratory: CTA Bilaterally without wheezes or rales. Normal effort    GI: BS +, non-tender, non-distended  Musculoskeletal:  Comments:  tender midfoot and lateral malleolus---sl better today Neurological: He is alert. Oriented to self. Distracted.  Moves all 4 limbs but still a little slower moving feet d/t pain Skin: Skin iswarmand dry.  Psychiatric: Confused and sometimes difficult to redirect     Assessment/Plan: 1. Functional deficits secondary to recent right frontal ICH/seizures and resulting encephalopathy which require 3+ hours per day of interdisciplinary therapy in a comprehensive inpatient rehab setting.  Physiatrist is providing close team supervision and 24 hour management of active medical problems listed below.  Physiatrist and rehab team continue to assess barriers to discharge/monitor patient progress toward functional and medical goals  Care Tool:  Bathing    Body parts bathed by patient: Right arm, Right lower leg, Left arm, Left lower leg, Chest, Abdomen, Face, Front perineal area, Right upper leg, Left  upper leg, Buttocks   Body parts bathed by helper: Buttocks     Bathing assist Assist Level: Minimal Assistance - Patient > 75%     Upper Body Dressing/Undressing Upper body dressing   What is the patient wearing?: Pull over shirt    Upper body assist Assist Level:  Supervision/Verbal cueing    Lower Body Dressing/Undressing Lower body dressing      What is the patient wearing?: Incontinence brief, Pants     Lower body assist Assist for lower body dressing: Minimal Assistance - Patient > 75%     Toileting Toileting    Toileting assist Assist for toileting: Contact Guard/Touching assist(urinal)     Transfers Chair/bed transfer  Transfers assist  Chair/bed transfer activity did not occur: Safety/medical concerns  Chair/bed transfer assist level: 2 Helpers(for safety due to pain in B feet)     Locomotion Ambulation   Ambulation assist   Ambulation activity did not occur: Safety/medical concerns  Assist level: Minimal Assistance - Patient > 75% Assistive device: Walker-rolling Max distance: 20 ft   Walk 10 feet activity   Assist  Walk 10 feet activity did not occur: Safety/medical concerns  Assist level: Minimal Assistance - Patient > 75% Assistive device: Walker-rolling   Walk 50 feet activity   Assist Walk 50 feet with 2 turns activity did not occur: Safety/medical concerns  Assist level: Moderate Assistance - Patient - 50 - 74%(min except mod for turning) Assistive device: Walker-rolling    Walk 150 feet activity   Assist Walk 150 feet activity did not occur: Safety/medical concerns         Walk 10 feet on uneven surface  activity   Assist Walk 10 feet on uneven surfaces activity did not occur: Safety/medical concerns         Wheelchair     Assist Will patient use wheelchair at discharge?: No Type of Wheelchair: Manual    Wheelchair assist level: Supervision/Verbal cueing Max wheelchair distance: 23'    Wheelchair 50 feet with 2 turns activity    Assist        Assist Level: Supervision/Verbal cueing   Wheelchair 150 feet activity     Assist      Assist Level: Minimal Assistance - Patient > 75%   Blood pressure (!) 141/84, pulse 72, temperature 98.2 F (36.8 C), resp.  rate 18, height 5\' 11"  (1.803 m), weight 105.6 kg, SpO2 91 %.  Medical Problem List and Plan: 1.Decreased functional ability with altered mental statussecondary to recent right frontal ICH with seizureand associated encephalopathy ---Continue CIR therapies including PT, OT, and SLP   -ongoing waxing and waning cognitive deficits 2. Antithrombotics: -DVT/anticoagulation:SCD.Eliquis on hold due to [place]. -antiplatelet therapy: N/A 3. Pain Management:Hydrocodone 1 tablet every 6 hours as needed moderate pain  -feet remain barriers and distractions  -resumed prednisone 20mg  bid for now  -voltaren gel   -xrays reviewed, negative for fx,likely some chronic changes from gout 4. Mood:Provide emotional support -antipsychotic agents: Seroquel 25 mg nightlywith backup dose -continue sleep chart 5. Neuropsych: This patientis notcapable of making decisions on hisown behalf.  -with ongoing confusion and +UA, will check ucx and begin empiric keflex 6. Skin/Wound Care:Routine skin checks 7. Fluids/Electrolytes/Nutrition:encourage PO  -push fluids   K+ 3.9 8. Atrial fibrillation. No longer on anticoagulation due to [place]. Continue amiodarone 200 mg daily. Follow-up cardiology service Dr. Harrington Challenger. HR controlled in 70's currently 9. Seizure disorder. EEG negative. Keppra changed to Depakote500 mg every 12 hours 10. Chronic diastolic congestive  heart failure.Coreg 12.5 mg twice daily, Cozaar 25 mg daily. Monitor for any signs of fluid overload - daily weights up?---recheck today  (I/O negative!)   Filed Weights   12/01/18 0444 12/02/18 0510 12/03/18 0700  Weight: 98.1 kg 98.2 kg 105.6 kg    11. CKD stage III. Creatinine baseline 1.62.  Cr 1.65  8/24 12. COPD/CPAP. Follow-up Dr. Halford Chessman. Continue Dulera  -IS/OOB 13. Type 2 diabetes mellitus. Hemoglobin A1c 6.9. SSI. Check blood sugars before  meals and at bedtime. Patient on Glucophage 1000 mg twice daily prior to admission.   -cbg's elevated with steroid -resumed glucophage 250mg  bid---   14. History of ascending thoracic aortic aneurysm. Follow-up as outpatient 15. Hyperlipidemia. Lipitor    LOS: 6 days A FACE TO FACE EVALUATION WAS PERFORMED  Meredith Staggers 12/03/2018, 8:49 AM

## 2018-12-03 NOTE — Patient Care Conference (Signed)
Inpatient RehabilitationTeam Conference and Plan of Care Update Date: 12/02/2018   Time: 10:10 AM    Patient Name: Ronald [name]      Medical Record Number: [id number]  Date of Birth: May 13, 1945 Sex: Male         Room/Bed: 4W14C/4W14C-01 Payor Info: Payor: [place] / Plan: [employer] PPO / Product Type: *No Product type* /    Admitting Diagnosis: 1. TBI Team  NTBI, Encephalopathy; 13-15days  Admit Date/Time:  11/27/2018  7:55 PM Admission Comments: No comment available   Primary Diagnosis:  ICH (intracerebral hemorrhage) (HCC) Principal Problem: ICH (intracerebral hemorrhage) ([place])  Patient Active Problem List   Diagnosis Date Noted  . AMS (altered mental status) 11/23/2018  . Encephalopathy 11/22/2018  . CKD (chronic kidney disease), stage III ([place]) 11/22/2018  . Thoracic ascending aortic aneurysm ([place]) 11/22/2018  . Seizures ([place]), secondsry to [facility] 10/23/2018  . Coagulopathy ([place]), Eliquis 10/23/2018  . Cerebral edema ([place]) 10/23/2018  . Hypertensive emergency 10/23/2018  . Advanced age 42/16/2020  . ICH (intracerebral hemorrhage) ([place]) w/ SAH while on Eliquis 10/21/2018  . Hepatic steatosis 04/22/2018  . Actinic keratoses 06/07/2017  . BPH (benign prostatic hyperplasia) 05/30/2017  . Cardiomyopathy due to systemic disease ([place]) 03/09/2017  . Chronic diastolic CHF (congestive heart failure) ([place])   . History of colonic polyps 07/03/2016  . Chronic anticoagulation 07/03/2016  . Hyperglycemia, drug-induced 04/05/2015  . 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 ([place]) 02/21/2015  . On amiodarone therapy 12/08/2014  . Edema of both legs 12/08/2014  . Exertional dyspnea 08/18/2014  . Hypokalemia   . Tobacco abuse   . Paroxysmal atrial fibrillation ([place]) 05/04/2014  . Cigarette smoker two packs a day or less   . Prostate cancer ([place]) 10/15/2013  . Obesity (BMI 30-39.9) 04/17/2013  . Umbilical hernia [id number]  . Right inguinal  hernia 06/23/2012  . Hydrocele 06/19/2012  . Metabolic syndrome [id number]  . Type 2 diabetes mellitus with hyperglycemia ([place]) 03/20/2012  . Elevated PSA 03/20/2012  . Gout, unspecified 10/07/2009  . TINEA VERSICOLOR 07/19/2009  . PERS HX TOBACCO USE PRESENTING HAZARDS HEALTH 07/19/2009  . Obstructive sleep apnea 07/02/2008  . Hyperlipidemia with target LDL less than 70 07/01/2008  . Essential hypertension 07/01/2008  . ALLERGIC RHINITIS 07/01/2008    Expected Discharge Date: Expected Discharge Date: 12/11/18  Team Members Present: Physician leading conference: Dr. Alger Simons Social Worker Present: Lennart Pall, LCSW Nurse Present: Dwaine Gale, RN PT Present: Canary Brim, PT OT Present: Laverle Hobby, OT SLP Present: Weston Anna, SLP PPS Coordinator present : Gunnar Fusi, SLP     Current Status/Progress Goal Weekly Team Focus  Medical   history of right ICH. admitted for encephalopathy and sz. ongoing confusion. gout in feet  improve cognition and orientatin  see above. pain control   Bowel/Bladder   Continent of eladder/bowel, LBM 12/01/2018, has schedule stool softener and prn meds  Maintain continence  Assess adn address toileting needs QS and prn   Swallow/Nutrition/ Hydration             ADL's   min-mod A LB ADLs, min A UB dressing, min A transfers, cueing for sequencing/initiation  Supervision overall  cognitive retraining, ADL retraining, ADL transfers   Mobility   supervision<>CGA bed mobility, min assist sit<>stand, min assist short distance (20-60 ft) gait with RW, pt limited by BLE foot pain, impaired cognition (orientation, awareness)  supervision overall with LRAD, 2 steps to enter home with B rails  cognition, transfers,  gait, activity tolerance, balance, stairs as able   Communication             Safety/Cognition/ Behavioral Observations  Max A  Min A  attention, problem solving, recall   Pain   Complaint of pain to feet, prn Tylenol and Norco  provided, pain score 8-9/10 on pain score  < 2  QS/PRN assess with f/u assessment   Skin              Rehab Goals Patient on target to meet rehab goals: Yes *See Care Plan and progress notes for long and short-term goals.     Barriers to Discharge  Current Status/Progress Possible Resolutions Date Resolved   Physician    Medical stability;Behavior               Nursing                  PT                    OT                  SLP                SW                Discharge Planning/Teaching Needs:  Plan for pt to return home with wife who can provide 24/7 supervision  Teaching is ongoing as wife is present daily.   Team Discussion:  Making only limited progress with overall cognition.  MD concerned that cognition will be more of a long term concern for wife/ family to manage.  ST reports more language of confusion this morning.  MD to check UA.  He is cont b/b overall;  More drowsy today.  Currently ranging from supervision to mod assist with therapies and most goals set for supervision. Neuropsych to evaluate this afternoon.  Revisions to Treatment Plan:  NA    Continued Need for Acute Rehabilitation Level of Care: The patient requires daily medical management by a physician with specialized training in physical medicine and rehabilitation for the following conditions: Daily direction of a multidisciplinary physical rehabilitation program to ensure safe treatment while eliciting the highest outcome that is of practical value to the patient.: Yes Daily medical management of patient stability for increased activity during participation in an intensive rehabilitation regime.: Yes Daily analysis of laboratory values and/or radiology reports with any subsequent need for medication adjustment of medical intervention for : Neurological problems;Other;Mood/behavior problems   I attest that I was present, lead the team conference, and concur with the assessment and plan of the  team.   Rayann Jolley 12/03/2018, 1:10 PM    Team conference was held via web/ teleconference due to [place] - 19

## 2018-12-03 NOTE — Progress Notes (Signed)
Physical Therapy Session Note  Patient Details  Name: Ronald [name] MRN: [id number] Date of Birth: Nov 21, 1945  Today's Date: 12/03/2018 PT Individual Time: 1108-1205 PT Individual Time Calculation (min): 57 min   Short Term Goals: Week 1:  PT Short Term Goal 1 (Week 1): Pt will ambulate 50 ft with LRAD & min assist. PT Short Term Goal 2 (Week 1): Pt will consistently complete transfers with min assist & LRAD. PT Short Term Goal 3 (Week 1): Pt will negotiate 4 steps with B rails & mod assist. Week 2:    Week 3:     Skilled Therapeutic Interventions/Progress Updates:      Therapy Documentation Precautions:  Precautions Precautions: Fall Restrictions Weight Bearing Restrictions: No AM Session:  Pt confused but cooperative.   Pain:  bilat feet at least a 5 per pt.  Treatment to tolerance.  WBing limited this am due to pain. Therapeutic Exercise:  NuStep x  10  Min L3 resistance for 475 total steps for cardiovascular conditioning, LE ROM and strengthening in lesser wbing position. Repeated sit to stand from hiLo mat w/RW and cga x 10reps repeated x 2 w/rest between efforts for LE strengthening/balance   Gait:  68ft x 1 w/RW and min to mod assist plus vcs for safety/posture.  Pt c/o pain in feet as limiting factor. Gait 22ft w/RW, min assist and vcs for safety, again limited by c/o foot pain.    Therapeutic Activity:  Supine to sit on edge of bed w/rails and cga plus verbal cues. Bed to wc via stand pivot w/mod assist of 1, bed elevated to facilitate sit to stand due to difficulty from lowest setting. wc to/from  Nustep w/RW and min assist, cues for sequencing/hand placement.  Gait 15ft from wc in room to commode including negotiating uneven surface at doorway w/cga to min assist. Commode transfer w/min assist and verbal cues for safety using rw Pt able to perform hygiene independently Sit to stand from commode using R rail w/min assist, max assist for managing diaper and  shorts.  Pivot to wc w/min assist and verbal cues for safety and sequencing.    Pt left in wc w/alarm belt set and family present, needs in reach, nursing tech in room.     Assessment:  Pt confused and disoriented.  Functional mobility limited by c/o pain in feet/gout.      Therapy/Group: Individual Therapy  Callie Fielding, PT   12/03/2018, 12:36 PM

## 2018-12-04 ENCOUNTER — Inpatient Hospital Stay (HOSPITAL_COMMUNITY): Payer: Federal, State, Local not specified - PPO | Admitting: Occupational Therapy

## 2018-12-04 ENCOUNTER — Inpatient Hospital Stay (HOSPITAL_COMMUNITY): Payer: Federal, State, Local not specified - PPO | Admitting: Speech Pathology

## 2018-12-04 ENCOUNTER — Inpatient Hospital Stay (HOSPITAL_COMMUNITY): Payer: Federal, State, Local not specified - PPO

## 2018-12-04 LAB — GLUCOSE, CAPILLARY
Glucose-Capillary: 179 mg/dL — ABNORMAL HIGH (ref 70–99)
Glucose-Capillary: 177 mg/dL — ABNORMAL HIGH (ref 70–99)
Glucose-Capillary: 251 mg/dL — ABNORMAL HIGH (ref 70–99)
Glucose-Capillary: 250 mg/dL — ABNORMAL HIGH (ref 70–99)

## 2018-12-04 MED ORDER — PREDNISONE 5 MG PO TABS
10.0000 mg | ORAL_TABLET | Freq: Two times a day (BID) | ORAL | Status: DC
  Administered 2018-12-05 – 2018-12-06 (×3): 10 mg via ORAL
  Filled 2018-12-04 (×3): qty 2

## 2018-12-04 NOTE — Progress Notes (Signed)
Patient has refused CPAP for tonight.  Patient stated that he has tried our machine but he and the machine just don't get along.  No distress noted at this time, will continue to monitor patient.

## 2018-12-04 NOTE — Progress Notes (Signed)
Occupational Therapy Session Note  Patient Details  Name: Ronald [name] MRN: [id number] Date of Birth: Oct 15, 1945  Today's Date: 12/04/2018 OT Individual Time: [id number] OT Individual Time Calculation (min): 42 min    Short Term Goals: Week 1:  OT Short Term Goal 1 (Week 1): Pt will stand with CGA to complete 2 grooming tasks in order to increase functional standing tolerance OT Short Term Goal 2 (Week 1): Pt will maintain attention to full bathing task with no more than 2 VCs for redirection OT Short Term Goal 3 (Week 1): Pt will ambulate into bathroom for toileting task with min A using LRAD in prep for home mobility OT Short Term Goal 4 (Week 1): Pt will consistently completed sit>stand with min A using LRAD  Skilled Therapeutic Interventions/Progress Updates:    Pt resting in the wheelchair to start session.  He was able to state place and reason for hospitalization "stroke" as well as some memory, balance, and strength deficits he has also.  He was oriented to the day of the week but not the month or day of the month.  When asked the time, he was able to read it from the clock on the wall, but when asked if it was 10:15 day or night, he responded night.  Therapist then gave him instructional cueing to look outside so he could see that it was daytime instead of night, and he realized it then.  Pt reports being confused when he wakes up whether it's night or day.  Increased pain was reported in his left foot, so had him work on [employer] and endurance with use of the ergonometer.  He was able to complete 2 sets of 7 mins on resistance level 8 and RPMs maintained around 25.  He asked later the time which therapist reported was "10:58".  When asked if it was day or night he looked outside and again reported "night".  Pt given cueing to correct that it was day instead.  Finished session with return to the room and pt left sitting up in the wheelchair with call button and phone in reach and  safety belt in place.  Memory notebook filled out.  Therapy Documentation Precautions:  Precautions Precautions: Fall Restrictions Weight Bearing Restrictions: No   Pain: Pain Assessment Pain Scale: Faces Faces Pain Scale: Hurts a little bit Pain Type: Acute pain Pain Location: Leg Pain Orientation: Left Pain Descriptors / Indicators: Aching;Discomfort  Therapy/Group: Individual Therapy  Tameisha Covell OTR/L 12/04/2018, 12:51 PM

## 2018-12-04 NOTE — Plan of Care (Signed)
  Problem: Consults Goal: RH GENERAL PATIENT EDUCATION Description: See Patient Education module for education specifics. Outcome: Progressing   Problem: RH BLADDER ELIMINATION Goal: RH STG MANAGE BLADDER WITH ASSISTANCE Description: STG Manage Bladder With  Min Assistance Outcome: Progressing   Problem: RH SAFETY Goal: RH STG ADHERE TO SAFETY PRECAUTIONS W/ASSISTANCE/DEVICE Description: STG Adhere to Safety Precautions With Cue and REmindwersAssistance/Device. Outcome: Progressing   Problem: RH PAIN MANAGEMENT Goal: RH STG PAIN MANAGED AT OR BELOW PT'S PAIN GOAL Description: <3/10 Outcome: Progressing   Problem: RH KNOWLEDGE DEFICIT GENERAL Goal: RH STG INCREASE KNOWLEDGE OF SELF CARE AFTER HOSPITALIZATION Outcome: Progressing

## 2018-12-04 NOTE — Progress Notes (Signed)
Speech Language Pathology Daily Session Note  Patient Details  Name: TYLO KRASZEWSKI MRN: [id number] Date of Birth: 06-19-1945  Today's Date: 12/04/2018 SLP Individual Time: 1115-1200 SLP Individual Time Calculation (min): 45 min  Short Term Goals: Week 1: SLP Short Term Goal 1 (Week 1): Pt will consistently demonstrate Ox4 with MaxA verbal and visual cues SLP Short Term Goal 2 (Week 1): Pt will attend to basic familiar tasks for 10 minute intervals with ModA verbal cues for redirection. SLP Short Term Goal 3 (Week 1): Pt will recall new daily information with ModA multimodal cues SLP Short Term Goal 4 (Week 1): Pt will demonstrate functional problem solving for basic and familiar tasks with ModA multimodal cues.  Skilled Therapeutic Interventions: Skilled treatment session focused on cognitive goals. SLP facilitated session by utilizing memory notebook to recall events from previous therapy sessions. Patient able to read aloud what he did in sessions but continued to demonstrate language of confusion and relating clinicians to previous coworkers in the [employer]. Patient also required Max A verbal cues for a basic medication management task due to decreased mental flexibility and recall. Patient left upright in wheelchair with all needs within reach and alarm on. Continue with current plan of care.      Pain Pain Assessment Pain Scale: 0-10 Pain Score: 0-No pain  Therapy/Group: Individual Therapy  Emeric Novinger 12/04/2018, 12:23 PM

## 2018-12-04 NOTE — Progress Notes (Signed)
Physical Therapy Session Note  Patient Details  Name: Ronald [name] MRN: [id number] Date of Birth: [birth year]  Today's Date: 12/04/2018 PT Individual Time: 0800-0900 PT Individual Time Calculation (min): 60 min   Short Term Goals: Week 1:  PT Short Term Goal 1 (Week 1): Pt will ambulate 50 ft with LRAD & min assist. PT Short Term Goal 2 (Week 1): Pt will consistently complete transfers with min assist & LRAD. PT Short Term Goal 3 (Week 1): Pt will negotiate 4 steps with B rails & mod assist.  Skilled Therapeutic Interventions/Progress Updates:   Pt eating breakfast in bed.  With cues he put bil hearing aides in place.   PT donned thigh high TEDS; pt donned shorts in bed.   Pt moved into sitting EOB with supervision, HOB raised.  Sit> stand from raised bed with close supervision, to RW.  CGA to pivot to w/c.  Seated Therapeutic exercises performed with LE to increase strength for functional mobility. 20 x 1 bil adductor squeezes, R/L hip fleixon, R/L long arc quad knee extension wth ankle pumps at end range. For activity tolerance, w/c propulsion x 200' using bil UEs, supervision and cues for efficiency.  Up/down (2) 6" high steps, bil rails, CGA, with mod cues for sequencing.  Pt descended stairs backwards.  Gait training on level tile , RW, x 23' x 2  with CGA.  Gait includes excessive trunk flexion, lack of ankle DF R> :L, narrow BOS.  Wt bearing activities limited by gout pain and tight heel cords bil.  At end of session, pt seated in w/c with needs at hand, seat belt alarm set, and ice pack on r ankle. PT instructed pt to leave ice on less than 20 min; he voiced understanding.      Therapy Documentation Precautions:  Precautions Precautions: Fall Restrictions Weight Bearing Restrictions: No    Pain: Pain Assessment Pain Scale: 0-10 Pain Score: 4/10 bil feet, gout; premedicated       Therapy/Group: Individual Therapy  Dalynn Jhaveri 12/04/2018, 10:05 AM

## 2018-12-04 NOTE — Progress Notes (Signed)
[place] PHYSICAL MEDICINE & REHABILITATION PROGRESS NOTE   Subjective/Complaints: Up in bed. No new issues.  Still with "gout" pain. Seems to be a little better  ROS: Patient denies fever, rash, sore throat, blurred vision, nausea, vomiting, diarrhea, cough, shortness of breath or chest pain, joint or back pain, headache, or mood change.    Objective:   Dg Foot 2 Views Left  Result Date: 12/02/2018 CLINICAL DATA:  Bilateral foot pain.  History of gout. EXAM: LEFT FOOT - 2 VIEW COMPARISON:  No recent prior. FINDINGS: Soft tissue swelling noted. Calcific changes noted about the medial aspect of the distal left first metatarsal. This may be related to patient's history of gout. No bony erosions noted. No evidence of fracture. Mild peripheral vascular calcification cannot be excluded. IMPRESSION: Soft tissue swelling. Calcific changes in the soft tissues about the distal portion of the first metatarsal. These changes may be related to patient's history of gout. No acute bony abnormalities identified. No evidence of fracture or dislocation. Electronically Signed   By: Marcello Moores  Register   On: 12/02/2018 11:12   Dg Foot 2 Views Right  Result Date: 12/02/2018 CLINICAL DATA:  Bilateral foot pain. EXAM: RIGHT FOOT - 2 VIEW COMPARISON:  No recent prior. FINDINGS: Soft tissue swelling noted. Prominent degenerative change first MTP joint. Calcific densities noted along the medial and lateral base of the right first digit proximal phalanx. This may be related to the patient's history of gout. No bony erosive changes noted. No acute bony or joint abnormality identified. No evidence of fracture or dislocation. IMPRESSION: Calcific densities along the mediolateral base of the right first digit proximal phalanx. This may be related to the patient's history of gout. No bony erosions noted. No acute bony abnormality. No evidence of fracture dislocation. Electronically Signed   By: Marcello Moores  Register   On: 12/02/2018  11:44   No results for input(s): WBC, HGB, HCT, PLT in the last 72 hours. No results for input(s): NA, K, CL, CO2, GLUCOSE, BUN, CREATININE, CALCIUM in the last 72 hours.  Intake/Output Summary (Last 24 hours) at 12/04/2018 1042 Last data filed at 12/04/2018 0955 Gross per 24 hour  Intake 720 ml  Output 1050 ml  Net -330 ml     Physical Exam: Vital Signs Blood pressure 124/70, pulse 67, temperature 98 F (36.7 C), temperature source Oral, resp. rate 19, height 5\' 11"  (1.803 m), weight 105.6 kg, SpO2 94 %. Constitutional: No distress . Vital signs reviewed. HEENT: EOMI, oral membranes moist Neck: supple Cardiovascular: RRR without murmur. No JVD    Respiratory: CTA Bilaterally without wheezes or rales. Normal effort    GI: BS +, non-tender, non-distended  Musculoskeletal:  Comments:  tender midfoot and lateral malleolus---showing some improvement Neurological: He is alert. Oriented to self. Distracted.  Moves all 4 limbs but still a little slower moving feet d/t pain Skin: Skin iswarmand dry.  Psychiatric: Remains confused     Assessment/Plan: 1. Functional deficits secondary to recent right frontal ICH/seizures and resulting encephalopathy which require 3+ hours per day of interdisciplinary therapy in a comprehensive inpatient rehab setting.  Physiatrist is providing close team supervision and 24 hour management of active medical problems listed below.  Physiatrist and rehab team continue to assess barriers to discharge/monitor patient progress toward functional and medical goals  Care Tool:  Bathing    Body parts bathed by patient: Right arm, Right lower leg, Left arm, Left lower leg, Chest, Abdomen, Face, Front perineal area, Right upper leg, Left  upper leg, Buttocks   Body parts bathed by helper: Buttocks     Bathing assist Assist Level: Minimal Assistance - Patient > 75%     Upper Body Dressing/Undressing Upper body dressing   What is the patient  wearing?: Pull over shirt    Upper body assist Assist Level: Supervision/Verbal cueing    Lower Body Dressing/Undressing Lower body dressing      What is the patient wearing?: Incontinence brief, Pants     Lower body assist Assist for lower body dressing: Minimal Assistance - Patient > 75%     Toileting Toileting    Toileting assist Assist for toileting: Contact Guard/Touching assist(urinal)     Transfers Chair/bed transfer  Transfers assist  Chair/bed transfer activity did not occur: Safety/medical concerns  Chair/bed transfer assist level: Contact Guard/Touching assist     Locomotion Ambulation   Ambulation assist   Ambulation activity did not occur: Safety/medical concerns  Assist level: Contact Guard/Touching assist Assistive device: Walker-rolling Max distance: 23   Walk 10 feet activity   Assist  Walk 10 feet activity did not occur: Safety/medical concerns  Assist level: Contact Guard/Touching assist Assistive device: Walker-rolling   Walk 50 feet activity   Assist Walk 50 feet with 2 turns activity did not occur: Safety/medical concerns  Assist level: Minimal Assistance - Patient > 75% Assistive device: Walker-rolling    Walk 150 feet activity   Assist Walk 150 feet activity did not occur: Safety/medical concerns         Walk 10 feet on uneven surface  activity   Assist Walk 10 feet on uneven surfaces activity did not occur: Safety/medical concerns         Wheelchair     Assist Will patient use wheelchair at discharge?: No Type of Wheelchair: Manual    Wheelchair assist level: Supervision/Verbal cueing Max wheelchair distance: 69'    Wheelchair 50 feet with 2 turns activity    Assist        Assist Level: Supervision/Verbal cueing   Wheelchair 150 feet activity     Assist      Assist Level: Minimal Assistance - Patient > 75%   Blood pressure 124/70, pulse 67, temperature 98 F (36.7 C),  temperature source Oral, resp. rate 19, height 5\' 11"  (1.803 m), weight 105.6 kg, SpO2 94 %.  Medical Problem List and Plan: 1.Decreased functional ability with altered mental statussecondary to recent right frontal ICH with seizureand associated encephalopathy ---Continue CIR therapies including PT, OT, and SLP   -  waxing and waning cognitive deficits 2. Antithrombotics: -DVT/anticoagulation:SCD.Eliquis on hold due to [place]. -antiplatelet therapy: N/A 3. Pain Management:Hydrocodone 1 tablet every 6 hours as needed moderate pain  -feet remain barriers and distractions  -resumed prednisone 20mg  bid ---decrease to 10mg  tomorrow  -voltaren gel   -xrays reviewed, negative for fx,likely some chronic changes from gout 4. Mood:Provide emotional support -antipsychotic agents: Seroquel 25 mg nightlywith backup dose -continue sleep chart 5. Neuropsych: This patientis notcapable of making decisions on hisown behalf.  -source of confusion?  -  +UA,   ucx pending,  empiric keflex day 2 6. Skin/Wound Care:Routine skin checks 7. Fluids/Electrolytes/Nutrition:encourage PO  -push fluids   K+ 3.9 8. Atrial fibrillation. No longer on anticoagulation due to [place]. Continue amiodarone 200 mg daily. Follow-up cardiology service Dr. Harrington Challenger. HR controlled in 70's currently 9. Seizure disorder. EEG negative. Keppra changed to Depakote500 mg every 12 hours 10. Chronic diastolic congestive heart failure.Coreg 12.5 mg twice daily, Cozaar 25 mg daily.  Monitor for any signs of fluid overload - daily weights up?---recheck today  (I/O negative!)  -needs weight today! Still not confirmed from yesterday! Filed Weights   12/01/18 0444 12/02/18 0510 12/03/18 0700  Weight: 98.1 kg 98.2 kg 105.6 kg    11. CKD stage III. Creatinine baseline 1.62.  Cr 1.65  8/24 12. COPD/CPAP. Follow-up Dr. Halford Chessman. Continue  Dulera  -IS/OOB 13. Type 2 diabetes mellitus. Hemoglobin A1c 6.9. SSI. Check blood sugars before meals and at bedtime. Patient on Glucophage 1000 mg twice daily prior to admission.   -cbg's elevated with steroid -resumed glucophage 250mg  bid---  -SSI 14. History of ascending thoracic aortic aneurysm. Follow-up as outpatient 15. Hyperlipidemia. Lipitor    LOS: 7 days A FACE TO FACE EVALUATION WAS PERFORMED  Meredith Staggers 12/04/2018, 10:42 AM

## 2018-12-04 NOTE — Progress Notes (Signed)
Occupational Therapy Session Note  Patient Details  Name: Ronald [name] MRN: [id number] Date of Birth: [birth year]  Today's Date: 12/04/2018 OT Individual Time: 1300-1400 OT Individual Time Calculation (min): 60 min    Short Term Goals: Week 1:  OT Short Term Goal 1 (Week 1): Pt will stand with CGA to complete 2 grooming tasks in order to increase functional standing tolerance OT Short Term Goal 2 (Week 1): Pt will maintain attention to full bathing task with no more than 2 VCs for redirection OT Short Term Goal 3 (Week 1): Pt will ambulate into bathroom for toileting task with min A using LRAD in prep for home mobility OT Short Term Goal 4 (Week 1): Pt will consistently completed sit>stand with min A using LRAD  Skilled Therapeutic Interventions/Progress Updates:    1;1. Pt received in room with wife present. Pt requires use of memory notebook and MOD VC for reorientations and review of days activities. Pt completes standing balnce activity reaching laterally and crossing midline with CGA for balance in mod ranges outside BOS during card sorting activity with mod VC for noticing and fixing 1 error. Pt commpletes 3x30 ball toss with 3# wrist weights on BUE for strengthening and endurance required for BADLs. Pt ambulates around dayroom to room with CGA fading to S with 2 seated rest breaks and VC for wider BOS when walking. Exited session with pt setaed in bed, exit alarm on and call light in reach  Therapy Documentation Precautions:  Precautions Precautions: Fall Restrictions Weight Bearing Restrictions: No General:   Vital Signs:   Pain: Pain Assessment Pain Scale: Faces Faces Pain Scale: Hurts a little bit Pain Type: Acute pain Pain Location: Leg Pain Orientation: Left Pain Descriptors / Indicators: Aching;Discomfort ADL:   Vision   Perception    Praxis   Exercises:   Other Treatments:     Therapy/Group: Individual Therapy  Tonny Branch 12/04/2018, 1:59  PM

## 2018-12-05 ENCOUNTER — Inpatient Hospital Stay (HOSPITAL_COMMUNITY): Payer: Federal, State, Local not specified - PPO | Admitting: Physical Therapy

## 2018-12-05 ENCOUNTER — Inpatient Hospital Stay (HOSPITAL_COMMUNITY): Payer: Federal, State, Local not specified - PPO

## 2018-12-05 ENCOUNTER — Inpatient Hospital Stay (HOSPITAL_COMMUNITY): Payer: Federal, State, Local not specified - PPO | Admitting: Speech Pathology

## 2018-12-05 LAB — URIC ACID: Uric Acid, Serum: 5.2 mg/dL (ref 3.7–8.6)

## 2018-12-05 LAB — BASIC METABOLIC PANEL
Anion gap: 11 (ref 5–15)
BUN: 30 mg/dL — ABNORMAL HIGH (ref 8–23)
CO2: 25 mmol/L (ref 22–32)
Calcium: 8.9 mg/dL (ref 8.9–10.3)
Chloride: 99 mmol/L (ref 98–111)
Creatinine, Ser: 1.29 mg/dL — ABNORMAL HIGH (ref 0.61–1.24)
GFR calc Af Amer: >60 mL/min (ref >60)
GFR calc non Af Amer: 55 mL/min — ABNORMAL LOW (ref >60)
Glucose, Bld: 244 mg/dL — ABNORMAL HIGH (ref 70–99)
Potassium: 4.3 mmol/L (ref 3.5–5.1)
Sodium: 135 mmol/L (ref 135–145)

## 2018-12-05 LAB — URINE CULTURE: Culture: >=100000 — AB

## 2018-12-05 LAB — GLUCOSE, CAPILLARY
Glucose-Capillary: 214 mg/dL — ABNORMAL HIGH (ref 70–99)
Glucose-Capillary: 181 mg/dL — ABNORMAL HIGH (ref 70–99)
Glucose-Capillary: 260 mg/dL — ABNORMAL HIGH (ref 70–99)
Glucose-Capillary: 316 mg/dL — ABNORMAL HIGH (ref 70–99)

## 2018-12-05 LAB — CBC
HCT: 37.3 % — ABNORMAL LOW (ref 39.0–52.0)
Hemoglobin: 12.5 g/dL — ABNORMAL LOW (ref 13.0–17.0)
MCH: 33.2 pg (ref 26.0–34.0)
MCHC: 33.5 g/dL (ref 30.0–36.0)
MCV: 98.9 fL (ref 80.0–100.0)
Platelets: 275 10*3/uL (ref 150–400)
RBC: 3.77 MIL/uL — ABNORMAL LOW (ref 4.22–5.81)
RDW: 12.2 % (ref 11.5–15.5)
WBC: 9.7 10*3/uL (ref 4.0–10.5)
nRBC: 0 % (ref 0.0–0.2)

## 2018-12-05 NOTE — Progress Notes (Signed)
Speech Language Pathology Weekly Progress and Session Note  Patient Details  Name: Ronald [name] MRN: [id number] Date of Birth: 10-Sep-1945  Beginning of progress report period: November 28, 2018 End of progress report period: December 05, 2018  Today's Date: 12/05/2018 SLP Individual Time: 1421-1505 SLP Individual Time Calculation (min): 44 min  Short Term Goals: Week 1: SLP Short Term Goal 1 (Week 1): Pt will consistently demonstrate Ox4 with MaxA verbal and visual cues SLP Short Term Goal 1 - Progress (Week 1): Partly met SLP Short Term Goal 2 (Week 1): Pt will attend to basic familiar tasks for 10 minute intervals with ModA verbal cues for redirection. SLP Short Term Goal 2 - Progress (Week 1): Met SLP Short Term Goal 3 (Week 1): Pt will recall new daily information with ModA multimodal cues SLP Short Term Goal 3 - Progress (Week 1): Met SLP Short Term Goal 4 (Week 1): Pt will demonstrate functional problem solving for basic and familiar tasks with ModA multimodal cues. SLP Short Term Goal 4 - Progress (Week 1): Met    New Short Term Goals: Week 2: SLP Short Term Goal 1 (Week 2): STGs = LTGs d/t short remaining ELOS  Weekly Progress Updates: Pt has made some progress over the last reporting period and as a result he has met some STGs. Pt continues to require Mod A for memory, functional problem solving, attention and overall safety awareness. As a result, skilled ST continues to be required to target the above mentioned deficits, increase functional independence and reduce caregiver burden.      Intensity: Minumum of 1-2 x/day, 30 to 90 minutes Frequency: 3 to 5 out of 7 days Duration/Length of Stay: 12-14 days Treatment/Interventions: Cognitive remediation/compensation;Cueing hierarchy;Functional tasks;Environmental controls;Multimodal communication approach;Speech/Language facilitation;Therapeutic Activities;Internal/external aids;Patient/family education;Medication  managment   Daily Session  Skilled Therapeutic Interventions: Skilled treatment session focused on cognition goals.  Pt referred to printed schedule with Mod I and able to recall ST session being at current time. However he did have difficulty correlating this therapist with ST. SLP further faciltiated session by providing general orientation questions with pt able to answer correctly. Pt with vague answers when providing biographical information and required Mod A to complete semi-complex version of Blink. Pt able to state that he has difficulty with memory and balance as result of his stroke. Pt left upright in wheelchair, lap belt alarm on and all needs within reach. Continue per current plan of care.     General    Pain Pain Assessment Pain Score: 3   Therapy/Group: Individual Therapy  Rosaura Bolon 12/05/2018, 3:49 PM

## 2018-12-05 NOTE — Progress Notes (Signed)
Physical Therapy Weekly Progress Note  Patient Details  Name: Ronald [name] MRN: [id number] Date of Birth: 16-Jul-1945  Beginning of progress report period: November 28, 2018 End of progress report period: December 05, 2018  Today's Date: 12/05/2018 Patient has met 2 of 3 short term goals.  Pt is making good progress towards LTG's as he currently requires min assist<>CGA for gait with RW but gait distance and standing tolerance is limited by BLE gout pain. Pt would benefit from continued skilled PT treatment to focus on transfers, gait, stair negotiation, balance & cognitive remediation to increase independence with functional mobility & reduce fall risk prior to d/c home. Pt's caregiver would also benefit from hands on training prior to pt d/c home.   Patient continues to demonstrate the following deficits muscle weakness, decreased cardiorespiratoy endurance, decreased coordination, decreased initiation, decreased attention, decreased awareness, decreased problem solving, decreased safety awareness, decreased memory and delayed processing, and decreased standing balance, decreased postural control and decreased balance strategies and therefore will continue to benefit from skilled PT intervention to increase functional independence with mobility.  Patient progressing toward long term goals..  Continue plan of care.  PT Short Term Goals Week 1:  PT Short Term Goal 1 (Week 1): Pt will ambulate 50 ft with LRAD & min assist. PT Short Term Goal 1 - Progress (Week 1): Met PT Short Term Goal 2 (Week 1): Pt will consistently complete transfers with min assist & LRAD. PT Short Term Goal 2 - Progress (Week 1): Met PT Short Term Goal 3 (Week 1): Pt will negotiate 4 steps with B rails & mod assist. PT Short Term Goal 3 - Progress (Week 1): Progressing toward goal Week 2:  PT Short Term Goal 1 (Week 2): STG = LTG due to ELOS.   Therapy Documentation Precautions:  Precautions Precautions:  Fall Restrictions Weight Bearing Restrictions: No  Therapy/Group: Individual Therapy  [name] 12/05/2018, 10:36 AM

## 2018-12-05 NOTE — Progress Notes (Signed)
[place] PHYSICAL MEDICINE & REHABILITATION PROGRESS NOTE   Subjective/Complaints: Up in chair. Says that feet feel better. Still can't figure out why he's not doing more  ROS: Limited due to cognitive/behavioral     Objective:   No results found. Recent Labs    12/05/18 0508  WBC 9.7  HGB 12.5*  HCT 37.3*  PLT 275   Recent Labs    12/05/18 0508  NA 135  K 4.3  CL 99  CO2 25  GLUCOSE 244*  BUN 30*  CREATININE 1.29*  CALCIUM 8.9    Intake/Output Summary (Last 24 hours) at 12/05/2018 1025 Last data filed at 12/05/2018 0900 Gross per 24 hour  Intake 716 ml  Output 1550 ml  Net -834 ml     Physical Exam: Vital Signs Blood pressure 116/73, pulse 66, temperature 98 F (36.7 C), resp. rate 18, height 5\' 11"  (1.803 m), weight 105.6 kg, SpO2 93 %. Constitutional: No distress . Vital signs reviewed. HEENT: EOMI, oral membranes moist Neck: supple Cardiovascular: RRR without murmur. No JVD    Respiratory: CTA Bilaterally without wheezes or rales. Normal effort    GI: BS +, non-tender, non-distended  Musculoskeletal:  Comments:  tender midfoot and lateral malleolus less tender today Neurological: He is alert. Oriented to self and hospital. Seems more attentive  Moves all 4 limbs fairly equally Skin: Skin iswarmand dry.  Psychiatric: Remains confused but better awareness    Assessment/Plan: 1. Functional deficits secondary to recent right frontal ICH/seizures and resulting encephalopathy which require 3+ hours per day of interdisciplinary therapy in a comprehensive inpatient rehab setting.  Physiatrist is providing close team supervision and 24 hour management of active medical problems listed below.  Physiatrist and rehab team continue to assess barriers to discharge/monitor patient progress toward functional and medical goals  Care Tool:  Bathing    Body parts bathed by patient: Right arm, Right lower leg, Left arm, Left lower leg, Chest, Abdomen,  Face, Front perineal area, Right upper leg, Left upper leg, Buttocks   Body parts bathed by helper: Buttocks     Bathing assist Assist Level: Contact Guard/Touching assist     Upper Body Dressing/Undressing Upper body dressing   What is the patient wearing?: Pull over shirt    Upper body assist Assist Level: Supervision/Verbal cueing    Lower Body Dressing/Undressing Lower body dressing      What is the patient wearing?: Incontinence brief, Pants     Lower body assist Assist for lower body dressing: Contact Guard/Touching assist     Toileting Toileting    Toileting assist Assist for toileting: Contact Guard/Touching assist(urinal)     Transfers Chair/bed transfer  Transfers assist  Chair/bed transfer activity did not occur: Safety/medical concerns  Chair/bed transfer assist level: Contact Guard/Touching assist     Locomotion Ambulation   Ambulation assist   Ambulation activity did not occur: Safety/medical concerns  Assist level: Contact Guard/Touching assist Assistive device: Walker-rolling Max distance: 23   Walk 10 feet activity   Assist  Walk 10 feet activity did not occur: Safety/medical concerns  Assist level: Contact Guard/Touching assist Assistive device: Walker-rolling   Walk 50 feet activity   Assist Walk 50 feet with 2 turns activity did not occur: Safety/medical concerns  Assist level: Minimal Assistance - Patient > 75% Assistive device: Walker-rolling    Walk 150 feet activity   Assist Walk 150 feet activity did not occur: Safety/medical concerns         Walk 10 feet on  uneven surface  activity   Assist Walk 10 feet on uneven surfaces activity did not occur: Safety/medical concerns         Wheelchair     Assist Will patient use wheelchair at discharge?: No Type of Wheelchair: Manual    Wheelchair assist level: Supervision/Verbal cueing Max wheelchair distance: 5'    Wheelchair 50 feet with 2 turns  activity    Assist        Assist Level: Supervision/Verbal cueing   Wheelchair 150 feet activity     Assist      Assist Level: Minimal Assistance - Patient > 75%   Blood pressure 116/73, pulse 66, temperature 98 F (36.7 C), resp. rate 18, height 5\' 11"  (1.803 m), weight 105.6 kg, SpO2 93 %.  Medical Problem List and Plan: 1.Decreased functional ability with altered mental statussecondary to recent right frontal ICH with seizureand associated encephalopathy ---Continue CIR therapies including PT, OT, and SLP   - ongoing but ?improving cognitive deficits 2. Antithrombotics: -DVT/anticoagulation:SCD.Eliquis on hold due to [place]. -antiplatelet therapy: N/A 3. Pain Management:Hydrocodone 1 tablet every 6 hours as needed moderate pain  -feet remain barriers and distractions  - prednisone  decreased to 10mg  bid today  -voltaren gel   -xrays reviewed, negative for fx,likely some chronic changes from gout  -uric acid nl 4. Mood:Provide emotional support -antipsychotic agents: Seroquel 25 mg nightlywith backup dose -continue sleep chart 5. Neuropsych: This patientis notcapable of making decisions on hisown behalf.  -source of confusion?   -100 proteus in urine:  sens to keflex day 3 6. Skin/Wound Care:Routine skin checks 7. Fluids/Electrolytes/Nutrition:encourage PO  -push fluids   K+ 4.3 today 8. Atrial fibrillation. No longer on anticoagulation due to [place]. Continue amiodarone 200 mg daily. Follow-up cardiology service Dr. Harrington Challenger. HR controlled in 70's currently 9. Seizure disorder. EEG negative. Keppra changed to Depakote500 mg every 12 hours 10. Chronic diastolic congestive heart failure.Coreg 12.5 mg twice daily, Cozaar 25 mg daily. Monitor for any signs of fluid overload - daily weights up?-still not rechecked  -needs weight today! Still not confirmed from  yesterday! Filed Weights   12/01/18 0444 12/02/18 0510 12/03/18 0700  Weight: 98.1 kg 98.2 kg 105.6 kg    11. CKD stage III. Creatinine baseline 1.62.  Cr 1.65  8/24 12. COPD/CPAP. Follow-up Dr. Halford Chessman. Continue Dulera  -IS/OOB 13. Type 2 diabetes mellitus. Hemoglobin A1c 6.9. SSI. Check blood sugars before meals and at bedtime. Patient on Glucophage 1000 mg twice daily prior to admission.   -cbg's elevated with steroid, tapering down -resumed glucophage 250mg  bid---increase to 500mg   -SSI 14. History of ascending thoracic aortic aneurysm. Follow-up as outpatient 15. Hyperlipidemia. Lipitor    LOS: 8 days A FACE TO FACE EVALUATION WAS PERFORMED  Meredith Staggers 12/05/2018, 10:25 AM

## 2018-12-05 NOTE — Progress Notes (Signed)
Physical Therapy Session Note  Patient Details  Name: Ronald [name] MRN: [id number] Date of Birth: Jun 25, 1945  Today's Date: 12/05/2018 PT Individual Time: [id number] PT Individual Time Calculation (min): 71 min   Short Term Goals: Week 2:  PT Short Term Goal 1 (Week 2): STG = LTG due to ELOS.  Skilled Therapeutic Interventions/Progress Updates:  Pt received in w/c & agreeable to tx. Pt reports unrated constant pain in BLE with rest breaks provided throughout session for pain management & pain meds requested & administered during session. Pt transfers sit<>stand with CGA and ambulates in room/bathroom with RW & CGA. Pt performs 3/3 toileting steps with CGA for balance and pt with continent void & BM on toilet. Pt performs hand hygiene standing at sink with CGA for balance. Transported pt to gym via w/c dependent assist for time management and pt transfers to EOM stand pivot with RW & CGA. Pt appears to continue having heavy reliance on BUE on RW to alleviate BLE pain. Standing EOM pt engaged in 3" step taps with RUE HHA & mod assist with cuing for increased BOS in standing with task focusing on weight shifting L<>R & dynamic balance. Pt fearful of falling with task & therapist provides education & encouragement. Pt performs sit<>stand from EOM without BUE support with task focusing on BLE strengthening & overall balance. Therapist provides cuing to reduce pt pushing backwards on EOM with BLE, increased anterior weight shifting to increase ease of transfer, increased pelvic shift & hamstring activation in standing to reduce posterior LOB with pt requiring min assist overall. Pt ambulates 40 ft + 40 ft with RW & CGA and negotiates 4 steps (6") with B rails and min assist as pt demonstrates more impaired balance with reciprocal pattern. Pt stands at [facility] with CGA<>min assist while engaging in PNF diagonals with weighted ball with task focusing on BLE strengthening & balance. Pt engaged in peg board task  with pt standing for 5 minutes with supervision while completing first design & sitting for 2nd (pt request 2/2 fatigue). Pt is able to assemble simple & slightly complex design from choice of many with supervision and extra time for error correction & problem solving. Pt reports task looks easy but was very challenging. Pt ambulates back to room with RW & CGA with cuing to reduce shoulder elevation, increase upright posture, and to decrease support on BUE on RW. Pt left in w/c with chair alarm donned, telesitter in room, call bell in reach & wife present (wife asked to don mask upon PT entry & did so).    Therapy Documentation Precautions:  Precautions Precautions: Fall Restrictions Weight Bearing Restrictions: No    Therapy/Group: Individual Therapy  [name] 12/05/2018, 12:12 PM

## 2018-12-05 NOTE — Progress Notes (Signed)
Occupational Therapy Weekly Progress Note  Patient Details  Name: Ronald [name] MRN: [id number] Date of Birth: 13-Nov-1945  Beginning of progress report period: November 28, 2018 End of progress report period: December 05, 2018  Today's Date: 12/05/2018 OT Individual Time: 0700-0810 OT Individual Time Calculation (min): 70 min    Patient has met 4 of 4 short term goals.  Pt has made good progress towards goals this reporting period. Pt currently requires CGA for all mobility and LB ADLs improving from MOD-MAX A at evaluation. Pt cognition waxes and wanes for clarity, however memory strategies implemented in SLP are being carried over in other therapies to assist with cognitive deficits. Pt has improved sustained attention to be able to complete ADLs with minimal redirection. Pt wife has been present for some session and will complete family ed prior to d/c home.  Patient continues to demonstrate the following deficits: muscle weakness, decreased cardiorespiratoy endurance, decreased attention, decreased awareness, decreased problem solving, decreased safety awareness and delayed processing and decreased sitting balance, decreased standing balance, hemiplegia and decreased balance strategies and therefore will continue to benefit from skilled OT intervention to enhance overall performance with BADL and iADL.  Patient progressing toward long term goals..  Continue plan of care.  OT Short Term Goals Week 1:  OT Short Term Goal 1 (Week 1): Pt will stand with CGA to complete 2 grooming tasks in order to increase functional standing tolerance OT Short Term Goal 1 - Progress (Week 1): Met OT Short Term Goal 2 (Week 1): Pt will maintain attention to full bathing task with no more than 2 VCs for redirection OT Short Term Goal 2 - Progress (Week 1): Met OT Short Term Goal 3 (Week 1): Pt will ambulate into bathroom for toileting task with min A using LRAD in prep for home mobility OT Short Term Goal 3 -  Progress (Week 1): Met OT Short Term Goal 4 (Week 1): Pt will consistently completed sit>stand with min A using LRAD OT Short Term Goal 4 - Progress (Week 1): Met Week 2:  OT Short Term Goal 1 (Week 2): STG=LTG d/t ELOS  Skilled Therapeutic Interventions/Progress Updates:    1:1. Pt received in bed asleep easily aroused. tp completes ambulatory transfer to dresser to select clothign with CGA fading to S overall with VC forkeeping feet inside RW. Pt completes transfer into shower with CGA for backing up. Pt bathes with 1 VC for seated foot rinsing. All other sequencing and termination appropriate this date. Pt grooms in standing at sink with S for static standing balance. Pt dresses UB with set up and LB with S at sit to stand levelat sink. A only for ted donning only. Pt able to problem solve threading BLE into same pant leg, notice and respond appropriately without VC. Pt completes 2x ambulation around RN station with RW with VC for trunk extension and looking forward with seataed rest break in room in between trips. Exited session with pt seated in w/c, exit alarm on, call light tin reach, memory notebook reviewed and breakfast tray set up.  Therapy Documentation Precautions:  Precautions Precautions: Fall Restrictions Weight Bearing Restrictions: No General:   Vital Signs: Therapy Vitals Temp: 98 F (36.7 C) Pulse Rate: 66 Resp: 18 BP: 116/73 Patient Position (if appropriate): Lying Oxygen Therapy SpO2: 93 % O2 Device: Room Air Pain:   ADL:   Vision   Perception    Praxis   Exercises:   Other Treatments:     Therapy/Group:  Individual Therapy  Tonny Branch 12/05/2018, 6:49 AM

## 2018-12-05 NOTE — Progress Notes (Signed)
Patient refused CPAP.

## 2018-12-06 ENCOUNTER — Inpatient Hospital Stay (HOSPITAL_COMMUNITY): Payer: Federal, State, Local not specified - PPO | Admitting: Physical Therapy

## 2018-12-06 LAB — GLUCOSE, CAPILLARY
Glucose-Capillary: 234 mg/dL — ABNORMAL HIGH (ref 70–99)
Glucose-Capillary: 232 mg/dL — ABNORMAL HIGH (ref 70–99)
Glucose-Capillary: 242 mg/dL — ABNORMAL HIGH (ref 70–99)
Glucose-Capillary: 214 mg/dL — ABNORMAL HIGH (ref 70–99)

## 2018-12-06 MED ORDER — PREDNISONE 5 MG PO TABS
10.0000 mg | ORAL_TABLET | Freq: Every day | ORAL | Status: DC
  Administered 2018-12-07 – 2018-12-09 (×3): 10 mg via ORAL
  Filled 2018-12-06 (×3): qty 2

## 2018-12-06 NOTE — Plan of Care (Signed)
  Problem: Consults Goal: RH GENERAL PATIENT EDUCATION Description: See Patient Education module for education specifics. Outcome: Progressing   Problem: RH BLADDER ELIMINATION Goal: RH STG MANAGE BLADDER WITH ASSISTANCE Description: STG Manage Bladder With  Min Assistance Outcome: Progressing   Problem: RH SAFETY Goal: RH STG ADHERE TO SAFETY PRECAUTIONS W/ASSISTANCE/DEVICE Description: STG Adhere to Safety Precautions With Cue and REmindwersAssistance/Device. Outcome: Progressing   Problem: RH PAIN MANAGEMENT Goal: RH STG PAIN MANAGED AT OR BELOW PT'S PAIN GOAL Description: <3/10 Outcome: Progressing   Problem: RH KNOWLEDGE DEFICIT GENERAL Goal: RH STG INCREASE KNOWLEDGE OF SELF CARE AFTER HOSPITALIZATION Outcome: Progressing

## 2018-12-06 NOTE — Progress Notes (Signed)
Physical Therapy Session Note  Patient Details  Name: Ronald [name] MRN: [id number] Date of Birth: Oct 12, 1945  Today's Date: 12/06/2018 PT Individual Time: [id number] PT Individual Time Calculation (min): 63 min   Short Term Goals: Week 2:  PT Short Term Goal 1 (Week 2): STG = LTG due to ELOS.  Skilled Therapeutic Interventions/Progress Updates:   Pt received sitting in w/c and agreeable to therapy session.  Transported to/from gym in w/c. Sit<>stands w/c<>RW with CGA for steadying and occasional min assist for lifting if increased B LE foot pain during session. Ambulated ~21f using RW with CGA/min assist with pt reporting and demonstrating increased B LE foot pain then requesting to sit. Pt continues to report he was pre-medicated prior to therapist arrival. Performed seated B LE ankle DF/PF AROM for pain management x20reps each LE. Sit>stand with B LEs on airex to provide pt more foot cushion in weightbearing and pt reports decreased pain - mod assist for lifting into standing without AD. Performed standing on airex balance and cognitive task of creating minimal to moderate difficulty pipe tree patterns with min assist for balance while standing and mod assist for lifting into standing. Performed 1 round of standing on airex while tossing weighted bean bags to corn hole board with mod assist for balance - pt then reporting increased B LE foot pain and deferring further standing activities. Stand pivot EOM>w/c using RW with CGA/min assist for steadying/lifting and pt demonstrating heavy reliance on B UE support. Performed ~1367fB UE w/c propulsion focusing on increased activity tolerance with supervision and pt veering to the R requiring cuing and 1x min assist for correction. Stand pivot w/c<>Nustep using RW with CGA for steadying and pt continuing to demonstrate increased reliance on B UE support. Performed B UE and B LE reciprocal movements on Nustep focusing on increased activity tolerance  against level 4 resistance for 6 minutes totaling 298steps with cuing to maintain steps per minute >50 focusing on activity tolerance - pt met goal 90% of the time. Transported back to room in w/c. Stand pivot w/c>EOB using RW with CGA. Sit>supine with supervision. Pt left supine in bed with needs in reach, bed alarm on, telesitter in place, and B LEs elevated for pain and edema management.   Therapy Documentation Precautions:  Precautions Precautions: Fall Restrictions Weight Bearing Restrictions: No  Pain:  Reports B LE gout pain in his feet that worsens with weightbearing - provided cushion for pain management in standing and seated rest breaks throughout - notified RN and she reports pt did not receive medication prior to therapist arrival and present to administer medication.   Therapy/Group: Individual Therapy  CaTawana ScalePT, DPT 12/06/2018, 3:00 PM

## 2018-12-06 NOTE — Progress Notes (Signed)
[place] PHYSICAL MEDICINE & REHABILITATION PROGRESS NOTE   Subjective/Complaints: Pt slept well for the most part. Refused CPAP again  ROS: Patient denies fever, rash, sore throat, blurred vision, nausea, vomiting, diarrhea, cough, shortness of breath or chest pain,  headache, or mood change.      Objective:   No results found. Recent Labs    12/05/18 0508  WBC 9.7  HGB 12.5*  HCT 37.3*  PLT 275   Recent Labs    12/05/18 0508  NA 135  K 4.3  CL 99  CO2 25  GLUCOSE 244*  BUN 30*  CREATININE 1.29*  CALCIUM 8.9    Intake/Output Summary (Last 24 hours) at 12/06/2018 [id number] Last data filed at 12/06/2018 0700 Gross per 24 hour  Intake 960 ml  Output 1775 ml  Net -815 ml     Physical Exam: Vital Signs Blood pressure (!) 142/83, pulse (!) 56, temperature 98 F (36.7 C), temperature source Oral, resp. rate 19, height 5\' 11"  (1.803 m), weight 103 kg, SpO2 91 %. Constitutional: No distress . Vital signs reviewed. HEENT: EOMI, oral membranes moist Neck: supple Cardiovascular: RRR without murmur. No JVD    Respiratory: CTA Bilaterally without wheezes or rales. Normal effort    GI: BS +, non-tender, non-distended  Musculoskeletal:  Comments:  Feet less tender Neurological: He is alert. Oriented to self and hospital. Seems more attentive  Moves all 4 limbs Skin: Skin iswarmand dry.  Psychiatric: Confused, a bit more appropriate    Assessment/Plan: 1. Functional deficits secondary to recent right frontal ICH/seizures and resulting encephalopathy which require 3+ hours per day of interdisciplinary therapy in a comprehensive inpatient rehab setting.  Physiatrist is providing close team supervision and 24 hour management of active medical problems listed below.  Physiatrist and rehab team continue to assess barriers to discharge/monitor patient progress toward functional and medical goals  Care Tool:  Bathing    Body parts bathed by patient: Right arm,  Right lower leg, Left arm, Left lower leg, Chest, Abdomen, Face, Front perineal area, Right upper leg, Left upper leg, Buttocks   Body parts bathed by helper: Buttocks     Bathing assist Assist Level: Contact Guard/Touching assist     Upper Body Dressing/Undressing Upper body dressing   What is the patient wearing?: Pull over shirt    Upper body assist Assist Level: Supervision/Verbal cueing    Lower Body Dressing/Undressing Lower body dressing      What is the patient wearing?: Incontinence brief, Pants     Lower body assist Assist for lower body dressing: Contact Guard/Touching assist     Toileting Toileting    Toileting assist Assist for toileting: Contact Guard/Touching assist(urinal)     Transfers Chair/bed transfer  Transfers assist  Chair/bed transfer activity did not occur: Safety/medical concerns  Chair/bed transfer assist level: Contact Guard/Touching assist     Locomotion Ambulation   Ambulation assist   Ambulation activity did not occur: Safety/medical concerns  Assist level: Contact Guard/Touching assist Assistive device: Walker-rolling Max distance: 100 ft   Walk 10 feet activity   Assist  Walk 10 feet activity did not occur: Safety/medical concerns  Assist level: Contact Guard/Touching assist Assistive device: Walker-rolling   Walk 50 feet activity   Assist Walk 50 feet with 2 turns activity did not occur: Safety/medical concerns  Assist level: Contact Guard/Touching assist Assistive device: Walker-rolling    Walk 150 feet activity   Assist Walk 150 feet activity did not occur: Safety/medical concerns  Walk 10 feet on uneven surface  activity   Assist Walk 10 feet on uneven surfaces activity did not occur: Safety/medical concerns         Wheelchair     Assist Will patient use wheelchair at discharge?: No Type of Wheelchair: Manual    Wheelchair assist level: Supervision/Verbal cueing Max wheelchair  distance: 72'    Wheelchair 50 feet with 2 turns activity    Assist        Assist Level: Supervision/Verbal cueing   Wheelchair 150 feet activity     Assist      Assist Level: Minimal Assistance - Patient > 75%   Blood pressure (!) 142/83, pulse (!) 56, temperature 98 F (36.7 C), temperature source Oral, resp. rate 19, height 5\' 11"  (1.803 m), weight 103 kg, SpO2 91 %.  Medical Problem List and Plan: 1.Decreased functional ability with altered mental statussecondary to recent right frontal ICH with seizureand associated encephalopathy ---Continue CIR therapies including PT, OT, and SLP   - ongoing but ?improving cognitive deficits 2. Antithrombotics: -DVT/anticoagulation:SCD.Eliquis on hold due to [place]. -antiplatelet therapy: N/A 3. Pain Management:Hydrocodone 1 tablet every 6 hours as needed moderate pain  -feet remain barriers and distractions  - prednisone  decreased to 10mg  bid wean off over the next couple days  -voltaren gel   -xrays reviewed, negative for fx,likely some chronic changes from gout  -uric acid nl 4. Mood:Provide emotional support -antipsychotic agents: Seroquel 25 mg nightlywith backup dose -continue sleep chart 5. Neuropsych: This patientis notcapable of making decisions on hisown behalf.  -source of confusion?   -100 proteus in urine:  sens to keflex--continue for 7 days 6. Skin/Wound Care:Routine skin checks 7. Fluids/Electrolytes/Nutrition:encourage PO  -push fluids   K+ 4.3  8. Atrial fibrillation. No longer on anticoagulation due to [place]. Continue amiodarone 200 mg daily. Follow-up cardiology service Dr. Harrington Challenger. HR controlled in 70's currently 9. Seizure disorder. EEG negative. Keppra changed to Depakote500 mg every 12 hours 10. Chronic diastolic congestive heart failure.Coreg 12.5 mg twice daily, Cozaar 25 mg daily. Monitor for any signs of  fluid overload - daily weights up to 105kg and now down to 103kg while I/O's have been negative  -not sure weights have been accurate during stay Filed Weights   12/03/18 0700 12/05/18 1033 12/06/18 0500  Weight: 105.6 kg 103.3 kg 103 kg    -does not appear clinically fluid overloaded 11. CKD stage III. Creatinine baseline 1.62.  Cr 1.65  8/24 12. COPD/CPAP. Follow-up Dr. Halford Chessman. Continue Dulera  -IS/OOB 13. Type 2 diabetes mellitus. Hemoglobin A1c 6.9. SSI. Check blood sugars before meals and at bedtime. Patient on Glucophage 1000 mg twice daily prior to admission.   -cbg's elevated with steroid, tapering down -resumed glucophage 250mg  bid---increased to 500mg  bid on 8/28  -SSI 14. History of ascending thoracic aortic aneurysm. Follow-up as outpatient 15. Hyperlipidemia. Lipitor    LOS: 9 days A FACE TO [place] 12/06/2018, 9:37 AM

## 2018-12-07 ENCOUNTER — Inpatient Hospital Stay (HOSPITAL_COMMUNITY): Payer: Federal, State, Local not specified - PPO

## 2018-12-07 LAB — GLUCOSE, CAPILLARY
Glucose-Capillary: 154 mg/dL — ABNORMAL HIGH (ref 70–99)
Glucose-Capillary: 200 mg/dL — ABNORMAL HIGH (ref 70–99)
Glucose-Capillary: 267 mg/dL — ABNORMAL HIGH (ref 70–99)
Glucose-Capillary: 151 mg/dL — ABNORMAL HIGH (ref 70–99)

## 2018-12-07 NOTE — Progress Notes (Signed)
One attempt OOB without assistance. Bed alarm alerted staff and reminded patient not to get OOB. Tellesitter also in place, for safety. Voided in urinal and incontinent in brief. Continent BM.  Bilateral pedal edema. voltaren gel applied to feet. "I like that stuff." Refused CPAP and SCD's. Patrici Ranks A

## 2018-12-07 NOTE — Progress Notes (Signed)
Occupational Therapy Session Note  Patient Details  Name: Ronald [name] MRN: [id number] Date of Birth: 04/13/1945  Today's Date: 12/07/2018 OT Individual Time: 0945-1100 OT Individual Time Calculation (min): 75 min    Short Term Goals: Week 2:  OT Short Term Goal 1 (Week 2): STG=LTG d/t ELOS  Skilled Therapeutic Interventions/Progress Updates:    Pt received supine with c/o pain in B feet, especially L toe, all staff aware. Shower used as pain management. Pt completed bed mobility with heavy use of bed rail but no physical assist needed. Pt completed stand pivot transfer to w/c with min A. Min cueing for hand placement. Pt completed another stand pivot transfer into shower, onto bari-BSC with min A. Pt required cueing throughout shower for thoroughness of bathing and task progression. Pt able to wash buttocks with lateral lean, CGA provided. Feet washed with figure 4 method. Pt transferred back to w/c out of shower with premature descend, requiring min A to correct direction of siting. Pt donned shirt sitting up in w/c with (S). Pt able to thread BLE through shorts and incontinence brief and required min A to stand to pull up. Pt with great improvement in LB dressing overall! Pt completed oral care at sink with set up assist. Pt propelled w/c 150 ft to therapy gym with intermittent cueing for navigating. Pt completed 2 sets of 25 ft of functional mobility with the RW, min A overall provided for balance support. Pt then sat EOM and completed BUE strengthening circuit with 4 # dumbbells, with a focus on muscles needed to push up for transfers. Pt returned to room and was left sitting up in the w/c with wife present, all needs met.   Therapy Documentation Precautions:  Precautions Precautions: Fall Restrictions Weight Bearing Restrictions: No   Therapy/Group: Individual Therapy   H  12/07/2018, 7:20 AM 

## 2018-12-07 NOTE — Progress Notes (Addendum)
[place] PHYSICAL MEDICINE & REHABILITATION PROGRESS NOTE   Subjective/Complaints: Still with poor safety awareness. Slept well for the most part. Incontinent at times  ROS: Patient denies fever, rash, sore throat, blurred vision, nausea, vomiting, diarrhea, cough, shortness of breath or chest pain,   headache, or mood change.    Objective:   No results found. Recent Labs    12/05/18 0508  WBC 9.7  HGB 12.5*  HCT 37.3*  PLT 275   Recent Labs    12/05/18 0508  NA 135  K 4.3  CL 99  CO2 25  GLUCOSE 244*  BUN 30*  CREATININE 1.29*  CALCIUM 8.9    Intake/Output Summary (Last 24 hours) at 12/07/2018 0846 Last data filed at 12/07/2018 0740 Gross per 24 hour  Intake 480 ml  Output 2200 ml  Net -1720 ml     Physical Exam: Vital Signs Blood pressure (!) 148/68, pulse 68, temperature 98 F (36.7 C), temperature source Oral, resp. rate 16, height 5\' 11"  (1.803 m), weight 103.2 kg, SpO2 98 %. Constitutional: No distress . Vital signs reviewed. HEENT: EOMI, oral membranes moist Neck: supple Cardiovascular: RRR without murmur. No JVD    Respiratory: CTA Bilaterally without wheezes or rales. Normal effort    GI: BS +, non-tender, non-distended  Musculoskeletal:  Comments:  Feet still a little tender with palp Neurological: He is alert. Oriented to self and hospital, remembers me. more attentive  Moves all 4 limbs Skin: Skin iswarmand dry.  Psychiatric: Still sl confused    Assessment/Plan: 1. Functional deficits secondary to recent right frontal ICH/seizures and resulting encephalopathy which require 3+ hours per day of interdisciplinary therapy in a comprehensive inpatient rehab setting.  Physiatrist is providing close team supervision and 24 hour management of active medical problems listed below.  Physiatrist and rehab team continue to assess barriers to discharge/monitor patient progress toward functional and medical goals  Care Tool:  Bathing     Body parts bathed by patient: Right arm, Right lower leg, Left arm, Left lower leg, Chest, Abdomen, Face, Front perineal area, Right upper leg, Left upper leg, Buttocks   Body parts bathed by helper: Buttocks     Bathing assist Assist Level: Contact Guard/Touching assist     Upper Body Dressing/Undressing Upper body dressing   What is the patient wearing?: Pull over shirt    Upper body assist Assist Level: Supervision/Verbal cueing    Lower Body Dressing/Undressing Lower body dressing      What is the patient wearing?: Incontinence brief, Pants     Lower body assist Assist for lower body dressing: Contact Guard/Touching assist     Toileting Toileting    Toileting assist Assist for toileting: Contact Guard/Touching assist(urinal)     Transfers Chair/bed transfer  Transfers assist  Chair/bed transfer activity did not occur: Safety/medical concerns  Chair/bed transfer assist level: Contact Guard/Touching assist     Locomotion Ambulation   Ambulation assist   Ambulation activity did not occur: Safety/medical concerns  Assist level: Minimal Assistance - Patient > 75% Assistive device: Walker-rolling Max distance: 77ft   Walk 10 feet activity   Assist  Walk 10 feet activity did not occur: Safety/medical concerns  Assist level: Minimal Assistance - Patient > 75% Assistive device: Walker-rolling   Walk 50 feet activity   Assist Walk 50 feet with 2 turns activity did not occur: Safety/medical concerns  Assist level: Contact Guard/Touching assist Assistive device: Walker-rolling    Walk 150 feet activity   Assist Walk 150  feet activity did not occur: Safety/medical concerns         Walk 10 feet on uneven surface  activity   Assist Walk 10 feet on uneven surfaces activity did not occur: Safety/medical concerns         Wheelchair     Assist Will patient use wheelchair at discharge?: Yes Type of Wheelchair: Manual    Wheelchair  assist level: Supervision/Verbal cueing, Set up assist Max wheelchair distance: 147ft    Wheelchair 50 feet with 2 turns activity    Assist        Assist Level: Supervision/Verbal cueing, Set up assist   Wheelchair 150 feet activity     Assist      Assist Level: Minimal Assistance - Patient > 75%   Blood pressure (!) 148/68, pulse 68, temperature 98 F (36.7 C), temperature source Oral, resp. rate 16, height 5\' 11"  ([id number] m), weight 103.2 kg, SpO2 98 %.  Medical Problem List and Plan: 1.Decreased functional ability with altered mental statussecondary to recent right frontal ICH with seizureand associated encephalopathy ---Continue CIR therapies including PT, OT, and SLP   - ongoing but improving cognitive deficits  -telesitter for safety 2. Antithrombotics: -DVT/anticoagulation:SCD.Eliquis on hold due to [place]. -antiplatelet therapy: N/A 3. Pain Management:Hydrocodone 1 tablet every 6 hours as needed moderate pain  -feet remain barriers and distractions  - prednisone  decreased to 10mg  daily as of today  -voltaren gel   -xrays reviewed, negative for fx,likely some chronic changes from gout  -uric acid nl 4. Mood:Provide emotional support -antipsychotic agents: Seroquel 25 mg nightlywith backup dose -continue sleep chart 5. Neuropsych: This patientis notcapable of making decisions on hisown behalf.  -source of confusion?   -100 proteus in urine:  sens to keflex--continue for 7 days 6. Skin/Wound Care:Routine skin checks 7. Fluids/Electrolytes/Nutrition:encourage PO  -push fluids   K+ 4.3  8. Atrial fibrillation. No longer on anticoagulation due to [place]. Continue amiodarone 200 mg daily. Follow-up cardiology service Dr. Harrington Challenger. HR controlled in 70's currently 9. Seizure disorder. EEG negative. Keppra changed to Depakote500 mg every 12 hours 10. Chronic diastolic congestive  heart failure.Coreg 12.5 mg twice daily, Cozaar 25 mg daily. Monitor for any signs of fluid overload - weights are holding around 103kg now  -I/O's negative overall  -not sure weights have been accurate during stay especially early [facility] Weights   12/05/18 1033 12/06/18 0500 12/07/18 0515  Weight: 103.3 kg 103 kg 103.2 kg    -does not appear clinically fluid overloaded 11. CKD stage III. Creatinine baseline 1.62.  Cr 1.65  8/24 12. COPD/CPAP. Follow-up Dr. Halford Chessman. Continue Dulera  -IS/OOB 13. Type 2 diabetes mellitus. Hemoglobin A1c 6.9. SSI. Check blood sugars before meals and at bedtime. Patient on Glucophage 1000 mg twice daily prior to admission.   -cbg's elevated with steroid, tapered down further today -resumed glucophage 250mg  bid---increased to 500mg  bid on 8/28  -SSI 14. History of ascending thoracic aortic aneurysm. Follow-up as outpatient 15. Hyperlipidemia. Lipitor    LOS: 10 days A FACE TO FACE EVALUATION WAS PERFORMED  Meredith Staggers 12/07/2018, 8:46 AM

## 2018-12-07 NOTE — Progress Notes (Signed)
Patient refused use of CPAP for this evening. Will continue to monitor patient.

## 2018-12-08 ENCOUNTER — Inpatient Hospital Stay (HOSPITAL_COMMUNITY): Payer: Federal, State, Local not specified - PPO

## 2018-12-08 ENCOUNTER — Inpatient Hospital Stay (HOSPITAL_COMMUNITY): Payer: Federal, State, Local not specified - PPO | Admitting: Speech Pathology

## 2018-12-08 ENCOUNTER — Ambulatory Visit: Payer: Medicare Other | Admitting: Occupational Therapy

## 2018-12-08 DIAGNOSIS — R52 Pain, unspecified: Secondary | ICD-10-CM

## 2018-12-08 LAB — GLUCOSE, CAPILLARY
Glucose-Capillary: 150 mg/dL — ABNORMAL HIGH (ref 70–99)
Glucose-Capillary: 209 mg/dL — ABNORMAL HIGH (ref 70–99)
Glucose-Capillary: 226 mg/dL — ABNORMAL HIGH (ref 70–99)
Glucose-Capillary: 175 mg/dL — ABNORMAL HIGH (ref 70–99)

## 2018-12-08 MED ORDER — DICLOFENAC SODIUM 1 % TD GEL
4.0000 g | Freq: Four times a day (QID) | TRANSDERMAL | Status: DC
  Administered 2018-12-08 – 2018-12-13 (×19): 4 g via TOPICAL
  Filled 2018-12-08: qty 100

## 2018-12-08 NOTE — Progress Notes (Signed)
Speech Language Pathology Daily Session Note  Patient Details  Name: Ronald [name] MRN: [id number] Date of Birth: 31-May-1945  Today's Date: 12/08/2018 SLP Individual Time: 1100-1200 SLP Individual Time Calculation (min): 60 min  Short Term Goals: Week 2: SLP Short Term Goal 1 (Week 2): STGs = LTGs d/t short remaining ELOS  Skilled Therapeutic Interventions: Skilled treatment session focused on cognitive goals. SLP facilitated session by providing extra time and Max A verbal cues for use of external aids for orientation to date and place. Patient also required Max A multimodal cues for utilization of his schedule and memory notebook in order to recall events from previous therapy sessions as well as anticipate upcoming sessions. Patient required Max A verbal cues for sustained attention to task and function impacted by not having a functional battery in his hearing aids. Patient's wife present and educated on patient's current cognitive deficits and goals of skilled SLP intervention. She verbalized understanding. Patient left upright in bed with alarm on and all needs within reach. Continue with current plan of care.      Pain Pain in feet due to gout, patient premedicated   Therapy/Group: Individual Therapy  Saamiya Jeppsen 12/08/2018, 12:30 PM

## 2018-12-08 NOTE — Progress Notes (Signed)
Physical Therapy Session Note  Patient Details  Name: Ronald [name] MRN: [id number] Date of Birth: 10-08-1945  Today's Date: 12/08/2018 PT Individual Time: 0900-1000 PT Individual Time Calculation (min): 60 min   Short Term Goals:  Week 2:  PT Short Term Goal 1 (Week 2): STG = LTG due to ELOS.  Skilled Therapeutic Interventions/Progress Updates:   Pt resting in bed.  He rated pain bil feet 9/10, gout pain.  Premedicated.  Feet swollen, tender and slightly erythematous.   Pt donned shirt in supine and shorts in supine and sitting EOb. Rolling and sit>< supine with railing, modified independent.   He was unable to tolerate wt bearing in order to stand and pull up shorts, given extra time. Pt moaning in pain, and lay back down.  PT provided ice packs to bil feet with pt in supine.  PT spoke with Linna Hoff, [place] who said MD is revisiting gout dx and adjusting meds.    Therapeutic exercise performed with LE to increase strength for functional mobility: 10 x 2 cervical flexion, bil glut sets, R/L straight leg raises, R/L shoulder protraction with extended elbows.    Pt quite disoriented this AM, unable to use schedule, stating that it was Saturday evening repeatedly.pt rated pain 5/10.  PT suggested he try standing and ambulating; pt willing to try.  PT removed ice packs.  Pt attempted sit to stand but unable to tolerate; moved back into supine.  Wife arrived.  PT instructed her is use of ice packs 20 min on/off.  She is familiar with them having used them with her father. She stated that veins in pt's RLL are prominent and this is new.  PT informed Santiago Glad, RN and [place], [place].   At end of session, pt in bed with alarm set and wife present.      Therapy Documentation Precautions:  Precautions Precautions: Fall Restrictions Weight Bearing Restrictions: No       Therapy/Group: Individual Therapy  Anwyn Kriegel 12/08/2018, 10:09 AM

## 2018-12-08 NOTE — Progress Notes (Signed)
[place] PHYSICAL MEDICINE & REHABILITATION PROGRESS NOTE   Subjective/Complaints:   Pt c/o significant gout pain in B/L feet- said it's not much better- "explained was trying to be a man, so I could look at them and trying not to cry".   ROS: Patient denies fever, rash, sore throat, blurred vision, nausea, vomiting, diarrhea, cough, shortness of breath or chest pain,   headache, or mood change.    Objective:   No results found. No results for input(s): WBC, HGB, HCT, PLT in the last 72 hours. No results for input(s): NA, K, CL, CO2, GLUCOSE, BUN, CREATININE, CALCIUM in the last 72 hours.  Intake/Output Summary (Last 24 hours) at 12/08/2018 0937 Last data filed at 12/08/2018 0811 Gross per 24 hour  Intake 740 ml  Output 1100 ml  Net -360 ml     Physical Exam: Vital Signs Blood pressure (!) 144/89, pulse 74, temperature 98.5 F (36.9 C), resp. rate 18, height 5\' 11"  (1.803 m), weight 100.8 kg, SpO2 94 %. Constitutional: No distress . Vital signs reviewed. HEENT: EOMI, oral membranes moist Neck: supple Cardiovascular: RRR without murmur. No JVD    Respiratory: CTA Bilaterally without wheezes or rales. Normal effort    GI: BS +, non-tender, non-distended  Musculoskeletal:  Comments:  Feet very TTP over dorsum esp more than rest of foot; mild erythema/ moderate foot edema/swelling; warm to touch Neurological: He is alert. Oriented to self and hospital, remembers me. more attentive  Moves all 4 limbs Skin: Skin iswarmand dry.  Psychiatric: Still sl confused    Assessment/Plan: 1. Functional deficits secondary to recent right frontal ICH/seizures and resulting encephalopathy which require 3+ hours per day of interdisciplinary therapy in a comprehensive inpatient rehab setting.  Physiatrist is providing close team supervision and 24 hour management of active medical problems listed below.  Physiatrist and rehab team continue to assess barriers to discharge/monitor  patient progress toward functional and medical goals  Care Tool:  Bathing    Body parts bathed by patient: Right arm, Right lower leg, Left arm, Left lower leg, Chest, Abdomen, Face, Front perineal area, Right upper leg, Left upper leg, Buttocks   Body parts bathed by helper: Buttocks     Bathing assist Assist Level: Contact Guard/Touching assist     Upper Body Dressing/Undressing Upper body dressing   What is the patient wearing?: Pull over shirt    Upper body assist Assist Level: Supervision/Verbal cueing    Lower Body Dressing/Undressing Lower body dressing      What is the patient wearing?: Incontinence brief, Pants     Lower body assist Assist for lower body dressing: Minimal Assistance - Patient > 75%     Toileting Toileting    Toileting assist Assist for toileting: Contact Guard/Touching assist(urinal)     Transfers Chair/bed transfer  Transfers assist  Chair/bed transfer activity did not occur: Safety/medical concerns  Chair/bed transfer assist level: Minimal Assistance - Patient > 75%     Locomotion Ambulation   Ambulation assist   Ambulation activity did not occur: Safety/medical concerns  Assist level: Minimal Assistance - Patient > 75% Assistive device: Walker-rolling Max distance: 31ft   Walk 10 feet activity   Assist  Walk 10 feet activity did not occur: Safety/medical concerns  Assist level: Minimal Assistance - Patient > 75% Assistive device: Walker-rolling   Walk 50 feet activity   Assist Walk 50 feet with 2 turns activity did not occur: Safety/medical concerns  Assist level: Contact Guard/Touching assist Assistive device: Walker-rolling  Walk 150 feet activity   Assist Walk 150 feet activity did not occur: Safety/medical concerns         Walk 10 feet on uneven surface  activity   Assist Walk 10 feet on uneven surfaces activity did not occur: Safety/medical concerns         Wheelchair     Assist Will  patient use wheelchair at discharge?: Yes Type of Wheelchair: Manual    Wheelchair assist level: Supervision/Verbal cueing, Set up assist Max wheelchair distance: 1102ft    Wheelchair 50 feet with 2 turns activity    Assist        Assist Level: Supervision/Verbal cueing, Set up assist   Wheelchair 150 feet activity     Assist      Assist Level: Minimal Assistance - Patient > 75%   Blood pressure (!) 144/89, pulse 74, temperature 98.5 F (36.9 C), resp. rate 18, height 5\' 11"  (1.803 m), weight 100.8 kg, SpO2 94 %.  Medical Problem List and Plan: 1.Decreased functional ability with altered mental statussecondary to recent right frontal ICH with seizureand associated encephalopathy ---Continue CIR therapies including PT, OT, and SLP   - ongoing but improving cognitive deficits  -telesitter for safety 2. Antithrombotics: -DVT/anticoagulation:SCD.Eliquis on hold due to [place]. -antiplatelet therapy: N/A 3. Pain Management:Hydrocodone 1 tablet every 6 hours as needed moderate pain  -feet remain barriers and distractions  - prednisone  decreased to 10mg  daily as of today  -voltaren gel   -xrays reviewed, negative for fx,likely some chronic changes from gout  -uric acid nl  8/31- increased voltaren gel to QID 4 g, since don't want to increase prednisone- pt said near tears with exam- will defer to Dr Naaman Plummer 4. Mood:Provide emotional support -antipsychotic agents: Seroquel 25 mg nightlywith backup dose -continue sleep chart 5. Neuropsych: This patientis notcapable of making decisions on hisown behalf.  -source of confusion?   -100 proteus in urine:  sens to keflex--continue for 7 days 6. Skin/Wound Care:Routine skin checks 7. Fluids/Electrolytes/Nutrition:encourage PO  -push fluids   K+ 4.3  8. Atrial fibrillation. No longer on anticoagulation due to [place]. Continue amiodarone 200 mg  daily. Follow-up cardiology service Dr. Harrington Challenger. HR controlled in 70's currently 9. Seizure disorder. EEG negative. Keppra changed to Depakote500 mg every 12 hours 10. Chronic diastolic congestive heart failure.Coreg 12.5 mg twice daily, Cozaar 25 mg daily. Monitor for any signs of fluid overload - weights are holding around 103kg now  -I/O's negative overall  -not sure weights have been accurate during stay especially early  8/31- weight dropped to 100.8 kg Filed Weights   12/06/18 0500 12/07/18 0515 12/08/18 0558  Weight: 103 kg 103.2 kg 100.8 kg    -does not appear clinically fluid overloaded 11. CKD stage III. Creatinine baseline 1.62.  Cr 1.65  8/24 12. COPD/CPAP. Follow-up Dr. Halford Chessman. Continue Dulera  -IS/OOB 13. Type 2 diabetes mellitus. Hemoglobin A1c 6.9. SSI. Check blood sugars before meals and at bedtime. Patient on Glucophage 1000 mg twice daily prior to admission.   -cbg's elevated with steroid, tapered down further today -resumed glucophage 250mg  bid---increased to 500mg  bid on 8/28  -SSI 14. History of ascending thoracic aortic aneurysm. Follow-up as outpatient 15. Hyperlipidemia. Lipitor    LOS: 11 days A FACE TO FACE EVALUATION WAS PERFORMED  Ronald [name] 12/08/2018, 9:37 AM

## 2018-12-08 NOTE — Progress Notes (Signed)
Occupational Therapy Session Note  Patient Details  Name: Ronald [name] MRN: [id number] Date of Birth: 11/28/1945  Today's Date: 12/08/2018 OT Individual Time: 1300-1415 OT Individual Time Calculation (min): 75 min    Short Term Goals: Week 2:  OT Short Term Goal 1 (Week 2): STG=LTG d/t ELOS  Skilled Therapeutic Interventions/Progress Updates:    Pt received on toilet with NT present assisting. Pt completed peri hygiene posteriorly following BM via lateral leans with close (S), using w/c as UE support. Pt completed sit <> stand with heavy grab bar use with no more than CGA. Pt required min A to pull pants up in standing. Pt transferred to w/c, stand pivot, with min A. Pt completed hand hygiene at sink with min cueing. Pt attempted to propel w/c, more confused than usual, and requiring mod cueing for technique. Pt completed stand pivot transfer to therapy mat, c/o pain in his B feet, especially L toe, with weightbearing. Pt completed 2 trials of standing level functional reaching to simulate standing level toileting tasks. Pt began grimacing seated d/t toe pain and requested to end standing activities. Pt was returned to his room and he completed 3 trials of ice dunks with BLE reduce swelling. His feet were then propped up on a chair to ensure fluid return. Pt was left sitting up with the chair alarm belt fastened and all needs met.   Therapy Documentation Precautions:  Precautions Precautions: Fall Restrictions Weight Bearing Restrictions: No  Therapy/Group: Individual Therapy   H  12/08/2018, 7:13 AM 

## 2018-12-09 ENCOUNTER — Inpatient Hospital Stay (HOSPITAL_COMMUNITY): Payer: Federal, State, Local not specified - PPO | Admitting: Physical Therapy

## 2018-12-09 ENCOUNTER — Inpatient Hospital Stay (HOSPITAL_COMMUNITY): Payer: Federal, State, Local not specified - PPO | Admitting: Speech Pathology

## 2018-12-09 ENCOUNTER — Inpatient Hospital Stay (HOSPITAL_COMMUNITY): Payer: Federal, State, Local not specified - PPO

## 2018-12-09 LAB — GLUCOSE, CAPILLARY
Glucose-Capillary: 157 mg/dL — ABNORMAL HIGH (ref 70–99)
Glucose-Capillary: 215 mg/dL — ABNORMAL HIGH (ref 70–99)
Glucose-Capillary: 236 mg/dL — ABNORMAL HIGH (ref 70–99)
Glucose-Capillary: 305 mg/dL — ABNORMAL HIGH (ref 70–99)

## 2018-12-09 MED ORDER — COLCHICINE 0.6 MG PO TABS
0.6000 mg | ORAL_TABLET | Freq: Two times a day (BID) | ORAL | Status: DC
  Administered 2018-12-09 – 2018-12-12 (×7): 0.6 mg via ORAL
  Filled 2018-12-09 (×7): qty 1

## 2018-12-09 MED ORDER — PREDNISONE 20 MG PO TABS
20.0000 mg | ORAL_TABLET | Freq: Two times a day (BID) | ORAL | Status: DC
  Administered 2018-12-09 – 2018-12-11 (×4): 20 mg via ORAL
  Filled 2018-12-09 (×4): qty 1

## 2018-12-09 MED ORDER — PREDNISONE 5 MG PO TABS
10.0000 mg | ORAL_TABLET | Freq: Once | ORAL | Status: AC
  Administered 2018-12-09: 13:00:00 10 mg via ORAL
  Filled 2018-12-09: qty 2

## 2018-12-09 NOTE — Progress Notes (Signed)
Occupational Therapy Session Note  Patient Details  Name: Ronald [name] MRN: [id number] Date of Birth: Feb 12, 1946  Today's Date: 12/09/2018 OT Individual Time: 0845-1000 OT Individual Time Calculation (min): 75 min    Short Term Goals: Week 2:  OT Short Term Goal 1 (Week 2): STG=LTG d/t ELOS  Skilled Therapeutic Interventions/Progress Updates:    Pt received supine with c/o pain in B feet, 2/2 gout- team aware. Shower used as pain Mudlogger. Pt completed bed mobility with (S), use of bed rails and HOB elevated. Pt completed ambulatory transfer into the bathroom with CGA. Pt transferred onto bari-BSC in the shower with CGA. Pt able to wash UB seated with (S). Min A to stand from [facility] to wash peri areas. Pt returned to w/c following shower, requiring CGA for balance and min cueing for RW management. Pt completed grooming tasks at the sink with set up assist. Pt donned shirt with set up assist, independently orienting shirt when it was given to him backwards. Pt donned pants with min A 2/2 pain in foot limiting tolerance for threading feet. Pt able to stand with CGA at sink, min cueing for hand placement. Pt's wife, Ronald [name] arrived and engaged in discussion re need for AD/AE in the shower. Pt was left sitting up in the w/c with all needs, chair alarm set.   Therapy Documentation Precautions:  Precautions Precautions: Fall Restrictions Weight Bearing Restrictions: No   Therapy/Group: Individual Therapy  [name] 12/09/2018, 7:17 AM

## 2018-12-09 NOTE — Progress Notes (Signed)
Physical Therapy Session Note  Patient Details  Name: Ronald [name] MRN: [id number] Date of Birth: 06/27/1945  Today's Date: 12/09/2018 PT Individual Time: 1100-1200 PT Individual Time Calculation (min): 60 min   Short Term Goals: Week 2:  PT Short Term Goal 1 (Week 2): STG = LTG due to ELOS.  Skilled Therapeutic Interventions/Progress Updates:    Pt received supine in bed, agreeable with encouragement to participate in therapy session. Pt lethargic this AM which is unusual per pt's wife. Bed mobility Supervision. Sit to stand with CGA to RW. Stand pivot transfer bed to w/c with RW and CGA. Manual w/c propulsion x 150 ft with BUE and Supervision. Sit to stand with min A from w/c to RW. Stand pivot transfer w/c to Nustep with RW and min A. Nustep level 4 x 5 min with use of B UE/LE for global endurance training, use of washcloths on foot pads for improved pt comfort due to ongoing gout in BLE. Sit to stand x 4 reps from mat table onto airex with RW and min to mod A due to posterior lean in standing. Standing balance on airex with one UE support on RW and min to mod A for standing balance while performing horseshoe toss. Ambulation x 25 ft with RW and CGA. Pt left seated in w/c in room with needs in reach, ice pack to B feet, wife present at end of session.  Therapy Documentation Precautions:  Precautions Precautions: Fall Restrictions Weight Bearing Restrictions: No    Therapy/Group: Individual Therapy   Excell Seltzer, PT, DPT  12/09/2018, 3:34 PM

## 2018-12-09 NOTE — Progress Notes (Signed)
Speech Language Pathology Daily Session Note  Patient Details  Name: JABARI JOUETT MRN: [id number] Date of Birth: Jun 25, 1945  Today's Date: 12/09/2018 SLP Individual Time: 1255-1350 SLP Individual Time Calculation (min): 55 min  Short Term Goals: Week 2: SLP Short Term Goal 1 (Week 2): STGs = LTGs d/t short remaining ELOS   Skilled Therapeutic Interventions: Skilled treatment session focused on cognitive goals. SLP facilitated session by providing Max A verbal cues for problem solving and attention during a mildly complex task as patient had to write appointments down on a calendar. Patient left upright in bed with all needs within reach. Continue with current plan of care.       Pain No/Denies Pain   Therapy/Group: Individual Therapy  Ellington Greenslade 12/09/2018, 3:09 PM

## 2018-12-09 NOTE — Progress Notes (Signed)
[place] PHYSICAL MEDICINE & REHABILITATION PROGRESS NOTE   Subjective/Complaints:  Pt resting when I arrived. Has reported increased pain in feet again. In asking today, he feels that right foot is more tender now  ROS: Patient denies fever, rash, sore throat, blurred vision, nausea, vomiting, diarrhea, cough, shortness of breath or chest pain,   headache, or mood change.   Objective:   No results found. No results for input(s): WBC, HGB, HCT, PLT in the last 72 hours. No results for input(s): NA, K, CL, CO2, GLUCOSE, BUN, CREATININE, CALCIUM in the last 72 hours.  Intake/Output Summary (Last 24 hours) at 12/09/2018 0911 Last data filed at 12/09/2018 0811 Gross per 24 hour  Intake 620 ml  Output 1800 ml  Net -1180 ml     Physical Exam: Vital Signs Blood pressure 139/77, pulse 70, temperature 98.3 F (36.8 C), resp. rate 18, height 5\' 11"  (1.803 m), weight 100.2 kg, SpO2 92 %. Constitutional: No distress . Vital signs reviewed. HEENT: EOMI, oral membranes moist Neck: supple Cardiovascular: RRR without murmur. No JVD    Respiratory: CTA Bilaterally without wheezes or rales. Normal effort    GI: BS +, non-tender, non-distended   Musculoskeletal:  Comments: both feet TTP along midfoot/lateral. Right foot with noticeable warmth, swelling and erythema in this area.  Neurological: He is alert. Oriented to self and hospital, remembers me. Better insight overall  Moves all 4 limbs Skin: Skin iswarmand dry.  Psychiatric: Calm. pleasant   Assessment/Plan: 1. Functional deficits secondary to recent right frontal ICH/seizures and resulting encephalopathy which require 3+ hours per day of interdisciplinary therapy in a comprehensive inpatient rehab setting.  Physiatrist is providing close team supervision and 24 hour management of active medical problems listed below.  Physiatrist and rehab team continue to assess barriers to discharge/monitor patient progress toward  functional and medical goals  Care Tool:  Bathing    Body parts bathed by patient: Right arm, Right lower leg, Left arm, Left lower leg, Chest, Abdomen, Face, Front perineal area, Right upper leg, Left upper leg, Buttocks   Body parts bathed by helper: Buttocks     Bathing assist Assist Level: Contact Guard/Touching assist     Upper Body Dressing/Undressing Upper body dressing   What is the patient wearing?: Pull over shirt    Upper body assist Assist Level: Supervision/Verbal cueing    Lower Body Dressing/Undressing Lower body dressing      What is the patient wearing?: Incontinence brief, Pants     Lower body assist Assist for lower body dressing: Minimal Assistance - Patient > 75%     Toileting Toileting    Toileting assist Assist for toileting: Contact Guard/Touching assist(urinal)     Transfers Chair/bed transfer  Transfers assist  Chair/bed transfer activity did not occur: Safety/medical concerns  Chair/bed transfer assist level: Minimal Assistance - Patient > 75%     Locomotion Ambulation   Ambulation assist   Ambulation activity did not occur: Safety/medical concerns  Assist level: Minimal Assistance - Patient > 75% Assistive device: Walker-rolling Max distance: 100ft   Walk 10 feet activity   Assist  Walk 10 feet activity did not occur: Safety/medical concerns  Assist level: Minimal Assistance - Patient > 75% Assistive device: Walker-rolling   Walk 50 feet activity   Assist Walk 50 feet with 2 turns activity did not occur: Safety/medical concerns  Assist level: Contact Guard/Touching assist Assistive device: Walker-rolling    Walk 150 feet activity   Assist Walk 150 feet activity did  not occur: Safety/medical concerns         Walk 10 feet on uneven surface  activity   Assist Walk 10 feet on uneven surfaces activity did not occur: Safety/medical concerns         Wheelchair     Assist Will patient use wheelchair  at discharge?: Yes Type of Wheelchair: Manual    Wheelchair assist level: Supervision/Verbal cueing, Set up assist Max wheelchair distance: 173ft    Wheelchair 50 feet with 2 turns activity    Assist        Assist Level: Supervision/Verbal cueing, Set up assist   Wheelchair 150 feet activity     Assist      Assist Level: Minimal Assistance - Patient > 75%   Blood pressure 139/77, pulse 70, temperature 98.3 F (36.8 C), resp. rate 18, height 5\' 11"  (1.803 m), weight 100.2 kg, SpO2 92 %.  Medical Problem List and Plan: 1.Decreased functional ability with altered mental statussecondary to recent right frontal ICH with seizureand associated encephalopathy ---Continue CIR therapies including PT, OT, and SLP   -team conference today  - ongoing but improving cognitive deficits  -continue telesitter for safety 2. Antithrombotics: -DVT/anticoagulation:SCD.Eliquis on hold due to [place]. -antiplatelet therapy: N/A 3. Pain Management:Hydrocodone 1 tablet every 6 hours as needed moderate pain  -Gout of bilateral feet: pain remains a barrier. Right foot looks worse today  - prednisone: increase back to 20mg  bid  -voltaren gel QID  -begin colchicine 0.6mg  bid, GI permitting   -xrays reviewed, negative for fx,likely some chronic changes from gout  -uric acid nl    4. Mood:Provide emotional support -antipsychotic agents: Seroquel 25 mg nightlywith backup dose -continue sleep chart 5. Neuropsych: This patientis notcapable of making decisions on hisown behalf.  -source of confusion?   -100 proteus in urine:  sens to keflex--continue for 7 days 6. Skin/Wound Care:Routine skin checks 7. Fluids/Electrolytes/Nutrition:encourage PO  -push fluids   K+ 4.3   -recheck BMET Thursday 8. Atrial fibrillation. No longer on anticoagulation due to [place]. Continue amiodarone 200 mg daily. Follow-up  cardiology service Dr. Harrington Challenger. HR controlled in 70's currently 9. Seizure disorder. EEG negative. Keppra changed to Depakote500 mg every 12 hours 10. Chronic diastolic congestive heart failure.Coreg 12.5 mg twice daily, Cozaar 25 mg daily. Monitor for any signs of fluid overload -I/O's negative overall  -not sure weights have been accurate during stay especially early   -weights +/- stable Filed Weights   12/07/18 0515 12/08/18 0558 12/09/18 0546  Weight: 103.2 kg 100.8 kg 100.2 kg    -does not appear clinically fluid overloaded 11. CKD stage III. Creatinine baseline 1.62.  Cr 1.65  8/24 12. COPD/CPAP. Follow-up Dr. Halford Chessman. Continue Dulera  -IS/OOB 13. Type 2 diabetes mellitus. Hemoglobin A1c 6.9. SSI. Check blood sugars before meals and at bedtime. Patient on Glucophage 1000 mg twice daily prior to admission.   -cbg's elevated esp d/t steroid  -resumed glucophage 250mg  bid---increased to 500mg  bid on 8/28  -SSI 14. History of ascending thoracic aortic aneurysm. Follow-up as outpatient 15. Hyperlipidemia. Lipitor    LOS: 12 days A FACE TO FACE EVALUATION WAS PERFORMED  Meredith Staggers 12/09/2018, 9:11 AM

## 2018-12-10 ENCOUNTER — Encounter: Payer: Federal, State, Local not specified - PPO | Admitting: Occupational Therapy

## 2018-12-10 ENCOUNTER — Inpatient Hospital Stay (HOSPITAL_COMMUNITY): Payer: Federal, State, Local not specified - PPO | Admitting: Physical Therapy

## 2018-12-10 ENCOUNTER — Inpatient Hospital Stay (HOSPITAL_COMMUNITY): Payer: Federal, State, Local not specified - PPO | Admitting: Speech Pathology

## 2018-12-10 ENCOUNTER — Inpatient Hospital Stay (HOSPITAL_COMMUNITY): Payer: Federal, State, Local not specified - PPO

## 2018-12-10 ENCOUNTER — Ambulatory Visit: Payer: Federal, State, Local not specified - PPO | Admitting: Physical Therapy

## 2018-12-10 DIAGNOSIS — S069X0S Unspecified intracranial injury without loss of consciousness, sequela: Secondary | ICD-10-CM

## 2018-12-10 DIAGNOSIS — F09 Unspecified mental disorder due to known physiological condition: Secondary | ICD-10-CM

## 2018-12-10 DIAGNOSIS — G3189 Other specified degenerative diseases of nervous system: Secondary | ICD-10-CM

## 2018-12-10 LAB — GLUCOSE, CAPILLARY
Glucose-Capillary: 254 mg/dL — ABNORMAL HIGH (ref 70–99)
Glucose-Capillary: 216 mg/dL — ABNORMAL HIGH (ref 70–99)
Glucose-Capillary: 230 mg/dL — ABNORMAL HIGH (ref 70–99)
Glucose-Capillary: 313 mg/dL — ABNORMAL HIGH (ref 70–99)

## 2018-12-10 MED ORDER — METFORMIN HCL 500 MG PO TABS
500.0000 mg | ORAL_TABLET | Freq: Two times a day (BID) | ORAL | Status: DC
  Administered 2018-12-10 – 2018-12-11 (×2): 500 mg via ORAL
  Filled 2018-12-10 (×2): qty 1

## 2018-12-10 NOTE — Progress Notes (Signed)
Social Work Patient ID: Ronald [name], male   DOB: [birth year], 73 y.o.   MRN: [id number]   Met with Ronald [name] and Ronald [name] yesterday and today to review team conference.  Ronald [name] not engaged much yesterday but better today and Ronald [name] very pleased with how well he was doing today.  Both smiling and Ronald [name] reporting much decrease in foot pain.  Therapies also pleased with presentation today.  Targeted d/c date was changed to 9/8 and supervision goals kept.  Will hope that he remains consistently improved and will check in again tomorrow.  Zakir Henner, LCSW

## 2018-12-10 NOTE — Progress Notes (Signed)
Physical Therapy Session Note  Patient Details  Name: Ronald [name] MRN: [id number] Date of Birth: 10/15/1945  Today's Date: 12/10/2018 PT Individual Time: 1115-1200 PT Individual Time Calculation (min): 45 min   Short Term Goals: Week 2:  PT Short Term Goal 1 (Week 2): STG = LTG due to ELOS.  Skilled Therapeutic Interventions/Progress Updates:    Pt received supine in bed, agreeable to PT session. No complaints of pain. Per pt and his wife he is feeling much better today and performing much better overall. Bed mobility Supervision. Sit to stand with CGA to RW. Stand pivot transfer bed to w/c with RW CGA. Manual w/c propulsion x 150 ft with use of BUE and Supervision. Ascend/descend 4 stairs with 2 handrails and CGA. Demonstrated how to provide [facility] assist for patient's wife [place]. Then had pt's wife perform return demo of assisting pt on stairs. She demonstrates good understanding of how to assist and safely perform CGA for patient on stairs. Ambulation x 100', x 150' with RW and Supervision. Pt left seated in w/c in room with needs in reach at end of session, wife present. Pt demonstrates overall improvement in endurance and decreased assist needed with all mobility and functional transfers this date.  Therapy Documentation Precautions:  Precautions Precautions: Fall Restrictions Weight Bearing Restrictions: No    Therapy/Group: Individual Therapy   Excell Seltzer, PT, DPT  12/10/2018, 4:25 PM

## 2018-12-10 NOTE — Progress Notes (Signed)
Patient resting comfortably on home CPAP unit at this time. No assistance needed from RT

## 2018-12-10 NOTE — Patient Care Conference (Signed)
Inpatient RehabilitationTeam Conference and Plan of Care Update Date: 12/09/2018   Time: 10:15 AM    Patient Name: Ronald [name]      Medical Record Number: [id number]  Date of Birth: 1946/03/22 Sex: Male         Room/Bed: 4W14C/4W14C-01 Payor Info: Payor: [place] / Plan: [employer] PPO / Product Type: *No Product type* /    Admitting Diagnosis: 1. TBI Team  NTBI, Encephalopathy; 13-15days  Admit Date/Time:  11/27/2018  7:55 PM Admission Comments: No comment available   Primary Diagnosis:  ICH (intracerebral hemorrhage) (HCC) Principal Problem: ICH (intracerebral hemorrhage) ([place])  Patient Active Problem List   Diagnosis Date Noted  . Pain   . AMS (altered mental status) 11/23/2018  . Encephalopathy 11/22/2018  . CKD (chronic kidney disease), stage III ([place]) 11/22/2018  . Thoracic ascending aortic aneurysm ([place]) 11/22/2018  . Seizures ([place]), secondsry to [facility] 10/23/2018  . Coagulopathy ([place]), Eliquis 10/23/2018  . Cerebral edema ([place]) 10/23/2018  . Hypertensive emergency 10/23/2018  . Advanced age 73/16/2020  . ICH (intracerebral hemorrhage) ([place]) w/ SAH while on Eliquis 10/21/2018  . Hepatic steatosis 04/22/2018  . Actinic keratoses 06/07/2017  . BPH (benign prostatic hyperplasia) 05/30/2017  . Cardiomyopathy due to systemic disease ([place]) 03/09/2017  . Chronic diastolic CHF (congestive heart failure) ([place])   . History of colonic polyps 07/03/2016  . Chronic anticoagulation 07/03/2016  . Hyperglycemia, drug-induced 04/05/2015  . 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 On amiodarone therapy 12/08/2014  . Edema of both legs 12/08/2014  . Exertional dyspnea 08/18/2014  . Hypokalemia   . Tobacco abuse   . Paroxysmal atrial fibrillation ([place]) 05/04/2014  . Cigarette smoker two packs a day or less   . Prostate cancer ([place]) 10/15/2013  . Obesity (BMI 30-39.9) 04/17/2013  . Umbilical hernia [id number]  . Right  inguinal hernia 06/23/2012  . Hydrocele 06/19/2012  . Metabolic syndrome [id number]  . Type 2 diabetes mellitus with hyperglycemia ([place]) 03/20/2012  . Elevated PSA 03/20/2012  . Gout, unspecified 10/07/2009  . TINEA VERSICOLOR 07/19/2009  . PERS HX TOBACCO USE PRESENTING HAZARDS HEALTH 07/19/2009  . Obstructive sleep apnea 07/02/2008  . Hyperlipidemia with target LDL less than 70 07/01/2008  . Essential hypertension 07/01/2008  . ALLERGIC RHINITIS 07/01/2008    Expected Discharge Date: Expected Discharge Date: 12/16/18  Team Members Present: Physician leading conference: Dr. Alger Simons Social Worker Present: Lennart Pall, LCSW Nurse Present: Benjie Karvonen, RN PT Present: Canary Brim, PT OT Present: Laverle Hobby, OT SLP Present: Weston Anna, SLP PPS Coordinator present : Ileana Ladd, PT     Current Status/Progress Goal Weekly Team Focus  Medical   improved cognition, gout was better initially but worse again since tapering prednisone, rx'ed UTI  maximize gout control  see above   Bowel/Bladder   Con of b/b lbm 12/08/18  Remain con of b/b      Swallow/Nutrition/ Hydration             ADL's   CGA LB ADLS, (S) UB ADLs, CGA-min A transfers, requires cueing for initiation and sequencing, as well as safety awareness  Supervision overall  Pain management, ADL retraining, ADL transfers, d/c planning, cognitive retraining   Mobility   CGA gait with RW, min assist 2 steps with B rails, impaired cognition  supervision overall with LRAD, 2 steps to enter home with B rails  cognition, transfers, gait, balance, stair negotiation, d/c planning, caregiver training, activity tolerance  Communication             Safety/Cognition/ Behavioral Observations  Mod-Max A  Min A  attention, problem solving and recall with use of strategies   Pain   C/o feet pain bilateral prn medication  as needed  Patient will have pain < 3 and use prn medication as needed.      Skin   no skin issues  at this time  remain free from breakdown and infection.       Rehab Goals Patient on target to meet rehab goals: No Rehab Goals Revised: probable gout flare has limited activity for pt - may need to extend his LOS> *See Care Plan and progress notes for long and short-term goals.     Barriers to Discharge  Current Status/Progress Possible Resolutions Date Resolved   Physician    Medical stability               Nursing                  PT                    OT                  SLP                SW                Discharge Planning/Teaching Needs:  Plan for pt to return home with wife who can provide 24/7 supervision  Teaching is ongoing as wife is present daily.   Team Discussion:  UTI treated; foot pain continues due to gout.  Slight improvement in cognition.  Has been refusing CPAP but hope that having his home mask may encourage better use.  Cont b/b.  CG for LB ADLs; S for UB.  CG - min assist with mobility and tfs.  Still needs a lot of cueing for safety.  Goals still for supervision.  Team does feel they need a few additional days to reach goals.  Revisions to Treatment Plan:  Change in d/c date to 9/8.    Continued Need for Acute Rehabilitation Level of Care: The patient requires daily medical management by a physician with specialized training in physical medicine and rehabilitation for the following conditions: Daily direction of a multidisciplinary physical rehabilitation program to ensure safe treatment while eliciting the highest outcome that is of practical value to the patient.: Yes Daily medical management of patient stability for increased activity during participation in an intensive rehabilitation regime.: Yes Daily analysis of laboratory values and/or radiology reports with any subsequent need for medication adjustment of medical intervention for : Neurological problems;Other   I attest that I was present, lead the team conference, and concur with the  assessment and plan of the team.   Donata Clay, Shanita Kanan 12/10/2018, 2:58 PM   Team conference was held via web/ teleconference due to [place] - 19

## 2018-12-10 NOTE — Plan of Care (Signed)
  Problem: RH BLADDER ELIMINATION Goal: RH STG MANAGE BLADDER WITH ASSISTANCE Description: STG Manage Bladder With  Min Assistance Outcome: Progressing   Problem: RH SAFETY Goal: RH STG ADHERE TO SAFETY PRECAUTIONS W/ASSISTANCE/DEVICE Description: STG Adhere to Safety Precautions With Cue and REmindwersAssistance/Device. Outcome: Progressing   Problem: RH PAIN MANAGEMENT Goal: RH STG PAIN MANAGED AT OR BELOW PT'S PAIN GOAL Description: <3/10 Outcome: Progressing

## 2018-12-10 NOTE — Progress Notes (Signed)
Speech Language Pathology Daily Session Note  Patient Details  Name: Ronald [name] MRN: [id number] Date of Birth: [birth year]  Today's Date: 12/10/2018 SLP Individual Time: 0830-0930 SLP Individual Time Calculation (min): 60 min  Short Term Goals: Week 2: SLP Short Term Goal 1 (Week 2): STGs = LTGs d/t short remaining ELOS  Skilled Therapeutic Interventions: Skilled treatment session focused on cognitive goals. SLP facilitated session by providing supervision level verbal cues for patient to utilize external aids for orientation to date with patient independently oriented to place. SLP also facilitated session by providing Mod A verbal cues and extra time for organization and Min A verbal cues for problem solving during a complex medication management task. Task was unable to be completed this session due to more than a reasonable amount of time needed.  Overall, patient appeared brighter with increased attention this session. Patient left upright in bed with alarm on and all needs within reach. Continue with current plan of care.      Pain No/Denies Pain   Therapy/Group: Individual Therapy  [name] 12/10/2018, 9:38 AM

## 2018-12-10 NOTE — Progress Notes (Signed)
[place] PHYSICAL MEDICINE & REHABILITATION PROGRESS NOTE   Subjective/Complaints:  Drowy sleeping with nasal bipap this am Oriented to person, "rehab" "[facility]" "sept" not day or year   ROS: Patient denies limited due to cognition Objective:   No results found. No results for input(s): WBC, HGB, HCT, PLT in the last 72 hours. No results for input(s): NA, K, CL, CO2, GLUCOSE, BUN, CREATININE, CALCIUM in the last 72 hours.  Intake/Output Summary (Last 24 hours) at 12/10/2018 [id number] Last data filed at 12/10/2018 0536 Gross per 24 hour  Intake 456 ml  Output 1500 ml  Net -1044 ml     Physical Exam: Vital Signs Blood pressure (!) 160/86, pulse 73, temperature 98.6 F (37 C), resp. rate 17, height 5\' 11"  (1.803 m), weight 99.6 kg, SpO2 95 %. Constitutional: No distress . Vital signs reviewed. HEENT: EOMI, oral membranes moist Neck: supple Cardiovascular: RRR without murmur. No JVD    Respiratory: CTA Bilaterally without wheezes or rales. Normal effort    GI: BS +, non-tender, non-distended   Musculoskeletal:  Comments: no pain to palpation in feet Neurological: He is alert. See above   Skin: Skin iswarmand dry.  Psychiatric: Calm. pleasant   Assessment/Plan: 1. Functional deficits secondary to recent right frontal ICH/seizures and resulting encephalopathy which require 3+ hours per day of interdisciplinary therapy in a comprehensive inpatient rehab setting.  Physiatrist is providing close team supervision and 24 hour management of active medical problems listed below.  Physiatrist and rehab team continue to assess barriers to discharge/monitor patient progress toward functional and medical goals  Care Tool:  Bathing    Body parts bathed by patient: Right arm, Right lower leg, Left arm, Left lower leg, Chest, Abdomen, Face, Front perineal area, Right upper leg, Left upper leg, Buttocks   Body parts bathed by helper: Buttocks     Bathing assist Assist  Level: Contact Guard/Touching assist     Upper Body Dressing/Undressing Upper body dressing   What is the patient wearing?: Pull over shirt    Upper body assist Assist Level: Supervision/Verbal cueing    Lower Body Dressing/Undressing Lower body dressing      What is the patient wearing?: Incontinence brief, Pants     Lower body assist Assist for lower body dressing: Minimal Assistance - Patient > 75%     Toileting Toileting    Toileting assist Assist for toileting: Contact Guard/Touching assist(urinal)     Transfers Chair/bed transfer  Transfers assist  Chair/bed transfer activity did not occur: Safety/medical concerns  Chair/bed transfer assist level: Minimal Assistance - Patient > 75%     Locomotion Ambulation   Ambulation assist   Ambulation activity did not occur: Safety/medical concerns  Assist level: Minimal Assistance - Patient > 75% Assistive device: Walker-rolling Max distance: 25'   Walk 10 feet activity   Assist  Walk 10 feet activity did not occur: Safety/medical concerns  Assist level: Minimal Assistance - Patient > 75% Assistive device: Walker-rolling   Walk 50 feet activity   Assist Walk 50 feet with 2 turns activity did not occur: Safety/medical concerns  Assist level: Contact Guard/Touching assist Assistive device: Walker-rolling    Walk 150 feet activity   Assist Walk 150 feet activity did not occur: Safety/medical concerns         Walk 10 feet on uneven surface  activity   Assist Walk 10 feet on uneven surfaces activity did not occur: Safety/medical concerns         Wheelchair  Assist Will patient use wheelchair at discharge?: Yes Type of Wheelchair: Manual    Wheelchair assist level: Supervision/Verbal cueing Max wheelchair distance: 150'    Wheelchair 50 feet with 2 turns activity    Assist        Assist Level: Supervision/Verbal cueing   Wheelchair 150 feet activity     Assist       Assist Level: Supervision/Verbal cueing   Blood pressure (!) 160/86, pulse 73, temperature 98.6 F (37 C), resp. rate 17, height 5\' 11"  (1.803 m), weight 99.6 kg, SpO2 95 %.  Medical Problem List and Plan: 1.Decreased functional ability with altered mental statussecondary to recent right frontal ICH with seizureand associated encephalopathy ---Continue CIR therapies including PT, OT, and SLP    - ongoing but improving cognitive deficits  -continue telesitter for safety 2. Antithrombotics: -DVT/anticoagulation:SCD.Eliquis on hold due to [place]. -antiplatelet therapy: N/A 3. Pain Management:Hydrocodone 1 tablet every 6 hours as needed moderate pain  -Gout of bilateral feet improved on increased prednisone  - prednisone: increase back to 20mg  bid  -voltaren gel QID  -begin colchicine 0.6mg  bid, GI permitting   -xrays reviewed, negative for fx,likely some chronic changes from gout  -uric acid nl    4. Mood:Provide emotional support -antipsychotic agents: Seroquel 25 mg nightlywith backup dose -continue sleep chart 5. Neuropsych: This patientis notcapable of making decisions on hisown behalf.  -source of confusion?   -100 proteus in urine:  sens to keflex--continue for 7 days 6. Skin/Wound Care:Routine skin checks 7. Fluids/Electrolytes/Nutrition:encourage PO  -push fluids   K+ 4.3   -recheck BMET Thursday 8. Atrial fibrillation. No longer on anticoagulation due to [place]. Continue amiodarone 200 mg daily. Follow-up cardiology service Dr. Harrington Challenger. HR controlled in 70's currently 9. Seizure disorder. EEG negative. Keppra changed to Depakote500 mg every 12 hours 10. Chronic diastolic congestive heart failure.Coreg 12.5 mg twice daily, Cozaar 25 mg daily. Monitor for any signs of fluid overload -I/O's negative overall  -not sure weights have been accurate during stay especially  early   -weights +/- stable Filed Weights   12/08/18 0558 12/09/18 0546 12/10/18 0530  Weight: 100.8 kg 100.2 kg 99.6 kg    Weight sl down today  11. CKD stage III. Creatinine baseline 1.62.  Cr 1.65  8/24 12. COPD/CPAP. Follow-up Dr. Halford Chessman. Continue Dulera  -IS/OOB 13. Type 2 diabetes mellitus. Hemoglobin A1c 6.9. SSI. Check blood sugars before meals and at bedtime. Patient on Glucophage 1000 mg twice daily prior to admission.  CBG (last 3)  Recent Labs    12/09/18 1624 12/09/18 2104 12/10/18 0532  GLUCAP 236* 305* 254*  will increase to 500mg   glucophage -SSI 14. History of ascending thoracic aortic aneurysm. Follow-up as outpatient 15. Hyperlipidemia. Lipitor    LOS: 13 days A FACE TO FACE EVALUATION WAS PERFORMED  Charlett Blake 12/10/2018, 8:22 AM

## 2018-12-10 NOTE — Progress Notes (Signed)
Occupational Therapy Session Note  Patient Details  Name: Ronald [name] MRN: [id number] Date of Birth: 16-Dec-1945  Today's Date: 12/10/2018 OT Individual Time: 1300-1415 OT Individual Time Calculation (min): 75 min   Session 2: OT Individual Time: 7357-8978 OT Individual Time Calculation (min): 45 min    Short Term Goals: Week 2:  OT Short Term Goal 1 (Week 2): STG=LTG d/t ELOS  Skilled Therapeutic Interventions/Progress Updates:    Session 1: Pt received sitting up in w/c finishing lunch. Pt completed ambulatory transfer into bathroom with close (S), using RW. Pt requiring no cueing for RW management or UE placement. Pt transferred onto low toilet and voided BM. Pt able to complete all hygiene with lateral leans and then in standing with close (S). Pt required min cueing for safety awareness, to doff clothing seated rather than standing. Pt transferred into shower (walk in, bariatric [facility] facing out) with CGA. Pt able to sequence through all bathing with no cueing. Pt stood with CGA to wash posterior peri area. Pt transferred out of shower back to w/c with min cueing for safety. Pt sat in w/c and donned shirt with set up assist. CGA to don pants, no assist needed. Pt completed oral care and grooming tasks at sink with set up. Pt c/o pain in his B feet and was provided B ice packs for pain relief. Pt edu on 15/15 ice principle and pt cued to remove ice packs after 15 min. Pt completed 150 ft of functional mobility with CGA, cueing for RW management and upright posture. Pt returned to bed and left supine with all needs met, bed alarm set.   Session 2:  Pt received supine with no c/o pain at rest. Pt completed bed mobility to EOB with (S). Pt completed functional mobility, 200 ft, with CGA. Pt sat EOM in therapy gym, minor c/o pain in B feet from walking. Pt completed reciprocal stepping task, activity graded via BUE support vs. Unilateral UE support, CGA provided throughout. Pt then completed  functional reaching task in standing with BUE to challenge dynamic standing balance and BUE endurance. Pt then completed simulated shower transfer, with problem solving completed with pt re entry and built environment. Pt completed "backing in" method of transferring into shower with min-mod cueing throughout. Pt much more engaged and oriented this session. Pt returned to room and was left supine with all needs met, bed alarm set.   Therapy Documentation Precautions:  Precautions Precautions: Fall Restrictions Weight Bearing Restrictions: No   Therapy/Group: Individual Therapy  [name] 12/10/2018, 7:18 AM

## 2018-12-11 ENCOUNTER — Inpatient Hospital Stay (HOSPITAL_COMMUNITY): Payer: Federal, State, Local not specified - PPO

## 2018-12-11 ENCOUNTER — Inpatient Hospital Stay (HOSPITAL_COMMUNITY): Payer: Federal, State, Local not specified - PPO | Admitting: Physical Therapy

## 2018-12-11 ENCOUNTER — Inpatient Hospital Stay (HOSPITAL_COMMUNITY): Payer: Federal, State, Local not specified - PPO | Admitting: Rehabilitation

## 2018-12-11 ENCOUNTER — Inpatient Hospital Stay (HOSPITAL_COMMUNITY): Payer: Federal, State, Local not specified - PPO | Admitting: Speech Pathology

## 2018-12-11 DIAGNOSIS — N390 Urinary tract infection, site not specified: Secondary | ICD-10-CM

## 2018-12-11 DIAGNOSIS — A499 Bacterial infection, unspecified: Secondary | ICD-10-CM

## 2018-12-11 LAB — GLUCOSE, CAPILLARY
Glucose-Capillary: 314 mg/dL — ABNORMAL HIGH (ref 70–99)
Glucose-Capillary: 292 mg/dL — ABNORMAL HIGH (ref 70–99)
Glucose-Capillary: 321 mg/dL — ABNORMAL HIGH (ref 70–99)
Glucose-Capillary: 266 mg/dL — ABNORMAL HIGH (ref 70–99)

## 2018-12-11 LAB — BASIC METABOLIC PANEL
Anion gap: 10 (ref 5–15)
BUN: 30 mg/dL — ABNORMAL HIGH (ref 8–23)
CO2: 24 mmol/L (ref 22–32)
Calcium: 8.2 mg/dL — ABNORMAL LOW (ref 8.9–10.3)
Chloride: 93 mmol/L — ABNORMAL LOW (ref 98–111)
Creatinine, Ser: 1.53 mg/dL — ABNORMAL HIGH (ref 0.61–1.24)
GFR calc Af Amer: 52 mL/min — ABNORMAL LOW (ref >60)
GFR calc non Af Amer: 44 mL/min — ABNORMAL LOW (ref >60)
Glucose, Bld: 321 mg/dL — ABNORMAL HIGH (ref 70–99)
Potassium: 5.3 mmol/L — ABNORMAL HIGH (ref 3.5–5.1)
Sodium: 127 mmol/L — ABNORMAL LOW (ref 135–145)

## 2018-12-11 MED ORDER — ALLOPURINOL 300 MG PO TABS
150.0000 mg | ORAL_TABLET | Freq: Every day | ORAL | Status: DC
  Administered 2018-12-11 – 2018-12-13 (×3): 150 mg via ORAL
  Filled 2018-12-11 (×3): qty 1

## 2018-12-11 MED ORDER — GLIMEPIRIDE 2 MG PO TABS
1.0000 mg | ORAL_TABLET | Freq: Every day | ORAL | Status: DC
  Administered 2018-12-11 – 2018-12-12 (×2): 1 mg via ORAL
  Filled 2018-12-11 (×2): qty 1

## 2018-12-11 MED ORDER — PREDNISONE 5 MG PO TABS
10.0000 mg | ORAL_TABLET | Freq: Two times a day (BID) | ORAL | Status: DC
  Administered 2018-12-11 – 2018-12-13 (×4): 10 mg via ORAL
  Filled 2018-12-11 (×4): qty 2

## 2018-12-11 NOTE — Progress Notes (Signed)
Social Work Patient ID: Ronald [name], male   DOB: September 30, 1945, 73 y.o.   MRN: [id number]   Pt and wife pleased with continued progress and agreeable with team recommendation to move dc date up to Saturday.    Elsie Sakuma, LCSW

## 2018-12-11 NOTE — Progress Notes (Signed)
Occupational Therapy Session Note  Patient Details  Name: Ronald [name] MRN: [id number] Date of Birth: Sep 28, 1945  Today's Date: 12/11/2018 OT Individual Time: 4193-7902 OT Individual Time Calculation (min): 71 min    Short Term Goals: Week 2:  OT Short Term Goal 1 (Week 2): STG=LTG d/t ELOS  Skilled Therapeutic Interventions/Progress Updates:    1;1. Pt received in w/c with no c/o pain! Pt completes all transfers at ambulatory level with RW and CS. Pt completes bathing and dressing at sit to stand level from [facility] in shower and toilet (dressing) with supervision/set up. Pt grooms at sink with MOD I. Pt completes functional mobility with RW to/from all tx destinations for improved activity tolerance and community mobility distances. Pt stands on standard tile (supervision) and compliant wedge (S-MIN A) to complete ball toss with 3# wrist weights on BUE for improvement of standing balance and BUE strengthening/endurance required for BADLs. Pt stands on compliant surface to fold laundry reaching laterally in B directions in mod-max ranges outside BOS for dynamic standing balance with CGA for standing balance overall. Exited session with pt seated in w/c, call light in reach, belt alarm on and all needs met.  Therapy Documentation Precautions:  Precautions Precautions: Fall Restrictions Weight Bearing Restrictions: No General: General PT Missed Treatment Reason: Other (Comment)(pt eating) Vital Signs: Therapy Vitals Pulse Rate: 95 Resp: 12 Patient Position (if appropriate): Lying Oxygen Therapy SpO2: 96 % O2 Device: Room Air Pain:   ADL:   Vision   Perception    Praxis   Exercises:   Other Treatments:     Therapy/Group: Individual Therapy  Tonny Branch 12/11/2018, 12:01 PM

## 2018-12-11 NOTE — Progress Notes (Signed)
Patient placed on home CPAP. 

## 2018-12-11 NOTE — Progress Notes (Signed)
Physical Therapy Session Note  Patient Details  Name: Ronald [name] MRN: [id number] Date of Birth: May 29, 1945  Today's Date: 12/11/2018 PT Individual Time: 1345-1430 PT Individual Time Calculation (min): 45 min   Short Term Goals: Week 2:  PT Short Term Goal 1 (Week 2): STG = LTG due to ELOS.  Skilled Therapeutic Interventions/Progress Updates:    Pt received seated in w/c in room, agreeable to PT session. No complaints of pain. Manual w/c propulsion x 200 ft with use of BUE and Supervision for global endurance training. Sit to stand with Supervision to RW throughout session. Sidesteps L/R 3 x 30 ft with rail and Supervision for hip strengthening. Ambulation x 150 ft with RW and Supervision, v/c to increase BLE clearance with gait as pt tends to shuffle. Ambulation through obstacle course with RW and Supervision with vs to keep RW closer to body when navigating around obstacles. Pt left seated in w/c in room with needs in reach, quick release belt and chair alarm in place at end of session.   Therapy Documentation Precautions:  Precautions Precautions: Fall Restrictions Weight Bearing Restrictions: No    Therapy/Group: Individual Therapy   Excell Seltzer, PT, DPT  12/11/2018, 3:47 PM

## 2018-12-11 NOTE — Progress Notes (Signed)
Physical Therapy Session Note  Patient Details  Name: Ronald [name] MRN: [id number] Date of Birth: 09/28/1945  Today's Date: 12/11/2018 PT Individual Time: 0850-0920 PT Individual Time Calculation (min): 30 min  and Today's Date: 12/11/2018 PT Missed Time: 15 Minutes Missed Time Reason: Other (Comment)(pt eating)  Short Term Goals: Week 1:  PT Short Term Goal 1 (Week 1): Pt will ambulate 50 ft with LRAD & min assist. PT Short Term Goal 1 - Progress (Week 1): Met PT Short Term Goal 2 (Week 1): Pt will consistently complete transfers with min assist & LRAD. PT Short Term Goal 2 - Progress (Week 1): Met PT Short Term Goal 3 (Week 1): Pt will negotiate 4 steps with B rails & mod assist. PT Short Term Goal 3 - Progress (Week 1): Progressing toward goal Week 2:  PT Short Term Goal 1 (Week 2): STG = LTG due to ELOS. Week 3:     Skilled Therapeutic Interventions/Progress Updates:   Pt received lying in bed, breakfast tray being placed in front of him.  RN reports he was sleeping and is just now eating.  PT allowed 20 mins for pt to eat food.  Upon returning to room, pt agreeable to therapy.  PT provided shirt for pt to don while in bed.  Cued to get to EOB which he was able to do at S level with HOB flat but with use of bed rail.  Once at EOB, pt able to turn slightly and put leg onto bed to don socks and shoes.  Pt reporting he wants to use restroom and perform self care at sink prior to leaving room.  Pt ambulated to/from restroom with RW at S level (min/guard given at toilet for safety) with min cues for safe hand placement.  Pt able to perform peri care seated on toilet.  Sit<>stand during session from bed and toilet at S level.  Ambulated to sink, however requested to sit despite PTs encouragement to stand and perform self care tasks.  Seated, pt washed hands, face and brushed teeth at mod I level.  Ended session with ambulation around nursing station and day room x 2 reps (approx 200') with RW at S  level with min cues for posture and maintaining appropriate position inside of RW.  Once back in room, PT wrote down schedule for pt in notebook.  Pt left in w/c with seat belt alarm donned and all needs in reach.   Therapy Documentation Precautions:  Precautions Precautions: Fall Restrictions Weight Bearing Restrictions: No Pain: Pt reports 3/10, but overall doing much better.     Therapy/Group: Individual Therapy  ,  Ann 12/11/2018, 9:02 AM  

## 2018-12-11 NOTE — Plan of Care (Signed)
  Problem: Consults Goal: RH GENERAL PATIENT EDUCATION Description: See Patient Education module for education specifics. Outcome: Progressing   Problem: RH BLADDER ELIMINATION Goal: RH STG MANAGE BLADDER WITH ASSISTANCE Description: STG Manage Bladder With  Min Assistance Outcome: Progressing   Problem: RH SAFETY Goal: RH STG ADHERE TO SAFETY PRECAUTIONS W/ASSISTANCE/DEVICE Description: STG Adhere to Safety Precautions With Cue and REmindwersAssistance/Device. Outcome: Progressing   Problem: RH PAIN MANAGEMENT Goal: RH STG PAIN MANAGED AT OR BELOW PT'S PAIN GOAL Description: <3/10 Outcome: Progressing   Problem: RH KNOWLEDGE DEFICIT GENERAL Goal: RH STG INCREASE KNOWLEDGE OF SELF CARE AFTER HOSPITALIZATION Outcome: Progressing

## 2018-12-11 NOTE — Discharge Summary (Signed)
Physician Discharge Summary  Patient ID: Ronald [name] MRN: [id number] DOB/AGE: 12-Mar-1946 73 y.o.  Admit date: 11/27/2018 Discharge date: 12/13/2018  Discharge Diagnoses:  Principal Problem:   Encephalopathy Active Problems:   Gout, unspecified   Essential hypertension   Type 2 diabetes mellitus with hyperglycemia (HCC)   Paroxysmal atrial fibrillation (HCC)   ICH (intracerebral hemorrhage) (HCC) w/ SAH while on Eliquis   Seizures ([place]), secondsry to ICH   Pain DVT prophylaxis Chronic diastolic congestive heart failure COPD History of a sending thoracic aortic aneurysm Hyperlipidemia CKD stage III  Discharged Condition: Stable  Significant Diagnostic Studies: Ct Angio Head W Or Wo Contrast  Result Date: 11/22/2018 CLINICAL DATA:  Possible vasculitis. EXAM: CT ANGIOGRAPHY HEAD AND NECK TECHNIQUE: Multidetector CT imaging of the head and neck was performed using the standard protocol during bolus administration of intravenous contrast. Multiplanar CT image reconstructions and MIPs were obtained to evaluate the vascular anatomy. Carotid stenosis measurements (when applicable) are obtained utilizing NASCET criteria, using the distal internal carotid diameter as the denominator. CONTRAST:  168mL OMNIPAQUE IOHEXOL 350 MG/ML SOLN COMPARISON:  CTA head neck 11/21/2018 FINDINGS: CTA NECK FINDINGS SKELETON: Multilevel degenerative disc disease and facet hypertrophy without bony spinal canal stenosis. OTHER NECK: Normal pharynx, larynx and major salivary glands. No cervical lymphadenopathy. Unremarkable thyroid gland. UPPER CHEST: No pneumothorax or pleural effusion. No nodules or masses. AORTIC ARCH: There is mild calcific atherosclerosis of the aortic arch. The ascending thoracic aorta measures 4.1 cm in diameter. Normal variant aortic arch branching pattern with the brachiocephalic and left common carotid arteries sharing a common origin. The visualized proximal subclavian arteries are widely  patent. RIGHT CAROTID SYSTEM: --Common carotid artery: There is a small atherosclerotic web extending from the right carotid bifurcation into the proximal right internal carotid artery --Internal carotid artery: Atherosclerotic web extends into the proximal ICA but does not cause stenosis. Remainder of the artery is normal. --External carotid artery: No acute abnormality. LEFT CAROTID SYSTEM: --Common carotid artery: Widely patent origin without common carotid artery dissection or aneurysm. --Internal carotid artery: Normal without aneurysm, dissection or stenosis. --External carotid artery: No acute abnormality. VERTEBRAL ARTERIES: Right dominant configuration. Both origins are clearly patent. No dissection, occlusion or flow-limiting stenosis to the skull base (V1-V3 segments). Multifocal atherosclerotic calcification. CTA HEAD FINDINGS POSTERIOR CIRCULATION: --Vertebral arteries: Normal V4 segments. --Posterior inferior cerebellar arteries (PICA): Patent origins from the vertebral arteries. --Anterior inferior cerebellar arteries (AICA): Patent origins from the basilar artery. --Basilar artery: Normal. --Superior cerebellar arteries: Normal. --Posterior cerebral arteries (PCA): Normal. Both originate from the basilar artery. Posterior communicating arteries (p-comm) are diminutive or absent. ANTERIOR CIRCULATION: --Intracranial internal carotid arteries: Atherosclerotic calcification of the internal carotid arteries at the skull base without hemodynamically significant stenosis. --Anterior cerebral arteries (ACA): Normal. Both A1 segments are present. Patent anterior communicating artery (a-comm). --Middle cerebral arteries (MCA): Normal. VENOUS SINUSES: As permitted by contrast timing, patent. ANATOMIC VARIANTS: None Review of the MIP images confirms the above findings. IMPRESSION: 1. No emergent large vessel occlusion or evidence of CNS vasculitis. 2. Atherosclerotic web within the proximal right internal  carotid artery without stenosis. This is new compared to the prior study. 3. 4.1 cm ascending thoracic aortic aneurysm. Recommend annual imaging followup by CTA or MRA. This recommendation follows 2010 ACCF/AHA/AATS/ACR/ASA/SCA/SCAI/SIR/STS/SVM Guidelines for the Diagnosis and Management of Patients with Thoracic Aortic Disease. Circulation. 2010; [id number]. Aortic aneurysm NOS (ICD10-I71.9) Aortic atherosclerosis (ICD10-I70.0). Electronically Signed   By: Ulyses Jarred M.D.   On: 11/22/2018 03:40  Ct Head Wo Contrast  Result Date: 11/22/2018 CLINICAL DATA:  Intracranial hemorrhage, known. Follow-up. EXAM: CT HEAD WITHOUT CONTRAST TECHNIQUE: Contiguous axial images were obtained from the base of the skull through the vertex without intravenous contrast. COMPARISON:  Head CTs dated 11/21/2018 and 11/13/2018. FINDINGS: Brain: Continued expected evolution of the previously demonstrated intraparenchymal hemorrhage within the RIGHT frontal lobe, again less conspicuous than on earlier studies. The associated parenchymal edema within the RIGHT frontal lobe is stable in extent. No associated mass effect or midline shift appreciated. No new parenchymal or extra-axial hemorrhage. No new edema. Ventricles are stable in size and configuration. Vascular: Chronic calcified atherosclerotic changes of the large vessels at the skull base. No unexpected hyperdense vessel. Skull: Normal. Negative for fracture or focal lesion. Sinuses/Orbits: No acute finding. Other: None. IMPRESSION: 1. Continued expected evolution of the previously demonstrated intraparenchymal hemorrhage within the RIGHT frontal lobe, again less conspicuous than on earlier studies. Associated parenchymal edema within the RIGHT frontal lobe is stable in extent. No associated mass effect or midline shift. 2. No new intracranial abnormality. No new or acute intracranial hemorrhage. Electronically Signed   By: Franki Cabot M.D.   On: 11/22/2018 19:54   Ct  Head Wo Contrast  Result Date: 11/21/2018 CLINICAL DATA:  73 year old male with increased confusion and memory loss over the past 2 days. Recent right frontal lobe hemorrhage. EXAM: CT HEAD WITHOUT CONTRAST TECHNIQUE: Contiguous axial images were obtained from the base of the skull through the vertex without intravenous contrast. COMPARISON:  Head CT dated 11/14/2018 FINDINGS: Brain: The right frontal intraparenchymal hemorrhage is less conspicuous in keeping with evolution of blood product. There is associated edema in the right frontal lobe which is similar in size or slightly decreased. There is mild age-related atrophy and chronic microvascular ischemic changes. No new or acute intracranial hemorrhage. No mass effect or midline shift. No extra-axial fluid collection. Vascular: No hyperdense vessel or unexpected calcification. Skull: Normal. Negative for fracture or focal lesion. Sinuses/Orbits: No acute finding. Other: None IMPRESSION: 1. No acute or new intracranial hemorrhage. 2. Decrease in conspicuity of the right frontal hemorrhage and slight decrease in associated edema. Electronically Signed   By: Anner Crete M.D.   On: 11/21/2018 23:10   Ct Head Wo Contrast  Result Date: 11/13/2018 CLINICAL DATA:  Follow-up hemorrhagic infarct. Increased confusion, weakness and lethargy. EXAM: CT HEAD WITHOUT CONTRAST TECHNIQUE: Contiguous axial images were obtained from the base of the skull through the vertex without intravenous contrast. COMPARISON:  Head CT 10/23/2018 FINDINGS: Brain: Evolutionary changes in the right ACA hemorrhagic infarct. The hematoma is much smaller now measuring a maximum of 2 cm and previously measuring 4 cm. There is also been contraction of the area of infarct with some residual encephalomalacia. No findings to suggest a hree bleed or new stroke. The ventricles are in the midline. Stable mild mass effect on the frontal horn on the right side. No extra-axial fluid collections.  Brainstem and cerebellum appear normal and stable. Vascular: Stable advanced vascular calcifications but no definite aneurysm or hyperdense vessels. Skull: No skull fractures or bone lesions Sinuses/Orbits: . the paranasal sinuses and mastoid air cells are clear. The globes are intact. Other: No scalp lesions or hematoma. IMPRESSION: 1. Expected evolutionary changes in the right ACA territory infarct with contraction of the hematoma and expected encephalomalacia. 2. No new/acute intracranial findings. Electronically Signed   By: Marijo Sanes M.D.   On: 11/13/2018 18:48   Ct Angio Neck W Or Wo Contrast  Result Date: 11/22/2018 CLINICAL DATA:  Possible vasculitis. EXAM: CT ANGIOGRAPHY HEAD AND NECK TECHNIQUE: Multidetector CT imaging of the head and neck was performed using the standard protocol during bolus administration of intravenous contrast. Multiplanar CT image reconstructions and MIPs were obtained to evaluate the vascular anatomy. Carotid stenosis measurements (when applicable) are obtained utilizing NASCET criteria, using the distal internal carotid diameter as the denominator. CONTRAST:  165mL OMNIPAQUE IOHEXOL 350 MG/ML SOLN COMPARISON:  CTA head neck 11/21/2018 FINDINGS: CTA NECK FINDINGS SKELETON: Multilevel degenerative disc disease and facet hypertrophy without bony spinal canal stenosis. OTHER NECK: Normal pharynx, larynx and major salivary glands. No cervical lymphadenopathy. Unremarkable thyroid gland. UPPER CHEST: No pneumothorax or pleural effusion. No nodules or masses. AORTIC ARCH: There is mild calcific atherosclerosis of the aortic arch. The ascending thoracic aorta measures 4.1 cm in diameter. Normal variant aortic arch branching pattern with the brachiocephalic and left common carotid arteries sharing a common origin. The visualized proximal subclavian arteries are widely patent. RIGHT CAROTID SYSTEM: --Common carotid artery: There is a small atherosclerotic web extending from the right  carotid bifurcation into the proximal right internal carotid artery --Internal carotid artery: Atherosclerotic web extends into the proximal ICA but does not cause stenosis. Remainder of the artery is normal. --External carotid artery: No acute abnormality. LEFT CAROTID SYSTEM: --Common carotid artery: Widely patent origin without common carotid artery dissection or aneurysm. --Internal carotid artery: Normal without aneurysm, dissection or stenosis. --External carotid artery: No acute abnormality. VERTEBRAL ARTERIES: Right dominant configuration. Both origins are clearly patent. No dissection, occlusion or flow-limiting stenosis to the skull base (V1-V3 segments). Multifocal atherosclerotic calcification. CTA HEAD FINDINGS POSTERIOR CIRCULATION: --Vertebral arteries: Normal V4 segments. --Posterior inferior cerebellar arteries (PICA): Patent origins from the vertebral arteries. --Anterior inferior cerebellar arteries (AICA): Patent origins from the basilar artery. --Basilar artery: Normal. --Superior cerebellar arteries: Normal. --Posterior cerebral arteries (PCA): Normal. Both originate from the basilar artery. Posterior communicating arteries (p-comm) are diminutive or absent. ANTERIOR CIRCULATION: --Intracranial internal carotid arteries: Atherosclerotic calcification of the internal carotid arteries at the skull base without hemodynamically significant stenosis. --Anterior cerebral arteries (ACA): Normal. Both A1 segments are present. Patent anterior communicating artery (a-comm). --Middle cerebral arteries (MCA): Normal. VENOUS SINUSES: As permitted by contrast timing, patent. ANATOMIC VARIANTS: None Review of the MIP images confirms the above findings. IMPRESSION: 1. No emergent large vessel occlusion or evidence of CNS vasculitis. 2. 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 This is new compared to the prior study. 3. 4.1 cm ascending thoracic aortic aneurysm.  Recommend annual imaging followup by CTA or MRA. This recommendation follows 2010 ACCF/AHA/AATS/ACR/ASA/SCA/SCAI/SIR/STS/SVM Guidelines for the Diagnosis and Management of Patients with Thoracic Aortic Disease. Circulation. 2010; [id number]. Aortic aneurysm NOS (ICD10-I71.9) Aortic atherosclerosis (ICD10-I70.0). Electronically Signed   By: Ulyses Jarred M.D.   On: 11/22/2018 03:40   Mr Brain Wo Contrast  Result Date: 11/14/2018 CLINICAL DATA:  Altered mental status. History of hemorrhagic stroke 3 weeks ago. EXAM: MRI HEAD WITHOUT CONTRAST TECHNIQUE: Multiplanar, multiecho pulse sequences of the brain and surrounding structures were obtained without intravenous contrast. COMPARISON:  Brain MRI 10/22/2018 and head CT same day FINDINGS: Brain: There aging blood products at the site of previously demonstrated right frontal lobe intraparenchymal hemorrhage. No acute hemorrhage. There is minimal adjacent edema, improved from the prior study. Leftward bulging of the right cingulate gyrus is unchanged. There is no acute ischemia. Vascular: Normal flow voids. Skull and upper cervical spine: Normal marrow signal. Sinuses/Orbits: Negative.  Other: None. IMPRESSION: 1. No acute intracranial abnormality. 2. Aging blood products at the site of recent right frontal lobe hemorrhage. Electronically Signed   By: Ulyses Jarred M.D.   On: 11/14/2018 00:34   Mr Jeri Cos [id number] Contrast  Result Date: 11/23/2018 CLINICAL DATA:  Encephalopathy. EXAM: MRI HEAD WITHOUT AND WITH CONTRAST TECHNIQUE: Multiplanar, multiecho pulse sequences of the brain and surrounding structures were obtained without and with intravenous contrast. CONTRAST:  10 cc Gadavist intravenous COMPARISON:  Brain MRI from 9 days ago FINDINGS: Brain: Subacute hematoma in the parafalcine anterior right frontal lobe T1 and T2 hyperintensity. The hematoma measures up to 4.2 x 2 cm on axial slices. No significant adjacent edema or mass effect. Mild small vessel ischemic  changes seen in the cerebral white matter and pons. No abnormal enhancement. No acute infarct, acute hemorrhage, hydrocephalus, or evident neoplasm. There are a few remote micro hemorrhages and left occipital parietal junction superficial siderosis that is also stable. Usually, amyloid angiopathy is more extensive/generalized. Vascular: Major flow voids and vascular enhancements are preserved Skull and upper cervical spine: Negative for marrow lesion Sinuses/Orbits: Bilateral cataract resection. IMPRESSION: 1. Contracting subacute right frontal hematoma without vasogenic edema or mass effect. 2. No acute finding or new abnormality. Electronically Signed   By: Monte Fantasia M.D.   On: 11/23/2018 13:09   Dg Foot 2 Views Left  Result Date: 12/02/2018 CLINICAL DATA:  Bilateral foot pain.  History of gout. EXAM: LEFT FOOT - 2 VIEW COMPARISON:  No recent prior. FINDINGS: Soft tissue swelling noted. Calcific changes noted about the medial aspect of the distal left first metatarsal. This may be related to patient's history of gout. No bony erosions noted. No evidence of fracture. Mild peripheral vascular calcification cannot be excluded. IMPRESSION: Soft tissue swelling. Calcific changes in the soft tissues about the distal portion of the first metatarsal. These changes may be related to patient's history of gout. No acute bony abnormalities identified. No evidence of fracture or dislocation. Electronically Signed   By: Marcello Moores  Register   On: 12/02/2018 11:12   Dg Foot 2 Views Right  Result Date: 12/02/2018 CLINICAL DATA:  Bilateral foot pain. EXAM: RIGHT FOOT - 2 VIEW COMPARISON:  No recent prior. FINDINGS: Soft tissue swelling noted. Prominent degenerative change first MTP joint. Calcific densities noted along the medial and lateral base of the right first digit proximal phalanx. This may be related to the patient's history of gout. No bony erosive changes noted. No acute bony or joint abnormality identified. No  evidence of fracture or dislocation. IMPRESSION: Calcific densities along the mediolateral base of the right first digit proximal phalanx. This may be related to the patient's history of gout. No bony erosions noted. No acute bony abnormality. No evidence of fracture dislocation. Electronically Signed   By: Marcello Moores  Register   On: 12/02/2018 11:44    Labs:  Basic Metabolic Panel: Recent Labs  Lab 12/11/18 0516  NA 127*  K 5.3*  CL 93*  CO2 24  GLUCOSE 321*  BUN 30*  CREATININE 1.53*  CALCIUM 8.2*    CBC: No results for input(s): WBC, NEUTROABS, HGB, HCT, MCV, PLT in the last 168 hours.  CBG: Recent Labs  Lab 12/10/18 2018 12/11/18 0515 12/11/18 1205 12/11/18 1658 12/11/18 2122  GLUCAP 313* 314* 292* 321* 266*   Family history.  Mother with breast cancer.  Sister with ovarian cancer.  Denies CVA, colon cancer, rectal cancer  Brief HPI:   Ronald [name] is a  73 y.o. right-handed male with history significant for right frontal ICH related to Eliquis and admitted 10/21/2018 until 10/27/2018 and discharged to home ambulating 240 feet without assistive device, atrial fibrillation followed by Dr. Dorris Carnes and no longer on anticoagulation, hypertension, chronic diastolic congestive heart failure, COPD followed by Dr. Halford Chessman, chronic kidney disease stage III with creatinine baseline 1.62, diabetes mellitus and seizure disorder maintained on Keppra.  Per chart review lives with spouse 2 level home with bed and bath on main level.  Reportedly independent prior to admission.  Presented 11/22/2018 with increased altered mental status poor balance leaning to the right side.  Urinalysis negative COVID negative.  Cranial CT scan showed no acute or new intracranial hemorrhage.  Decreasing conspiculity of the right frontal hemorrhage and slight decrease in associated edema.  CT angiogram of head and neck with no emergent large vessel occlusion.  MRI of the brain showed no acute findings or acute  abnormality.  Prior subacute hematoma measuring 4.2 x 2 cm no significant adjacent edema or mass-effect.  EEG negative for seizure.  Most recent echocardiogram after initial [place] 10/22/2018 with ejection fraction of 60% hyperdynamic systolic function.  Neurology follow-up Keppra was changed to Depakote twice daily.  Bouts of confusion a continue to improve.  Patient was admitted for a comprehensive rehab program.   Hospital Course: Ronald [name] was admitted to rehab 11/27/2018 for inpatient therapies to consist of PT, ST and OT at least three hours five days a week. Past admission physiatrist, therapy team and rehab RN have worked together to provide customized collaborative inpatient rehab.  Patient continued to progress nicely.  In regards to decreased functional abilities altered mental status secondary to recent right frontal ICH continue to improve in all areas.  He remained on Depakote for seizure prophylaxis EEG negative.  Patient would follow-up outpatient neurology services.  Initially with a telemetry sitter for safety later discontinued.  Mental status continued to improve he did continue on low-dose Seroquel.  DVT prophylaxis with SCDs.  Patient had been on Eliquis in the past since discontinued with ICH.  Hospital course complicated by bout of gout uric acid within normal limits responded well to prednisone with taper colchicine as advised as well as Voltaren gel and hydrocodone as needed.  Allopurinol was introduced with noted gout and good results.  Cardiac rate remained controlled patient maintained on amiodarone would follow-up with cardiology services.  He exhibited no signs of fluid overload.  Blood pressures controlled.  He did have a history of CKD stage III creatinine baseline 1.6 to remaining stable.  COPD followed outpatient by Dr. Halford Chessman remained on [facility].  Type 2 diabetes mellitus hemoglobin A1c 6.9 patient had been on Glucophage prior to admission could be resumed as needed but held due  to CKD and plan to begin Amaryl .  Maintained on Lipitor for hyperlipidemia.   Blood pressures were monitored on TID basis and remained controlled  Diabetes has been monitored with ac/hs CBG checks and SSI was use prn for tighter BS control.   He is continent of bowel and bladder.  He has made gains during rehab stay and is   He will continue to receive follow up therapies   after discharge  Rehab course: During patient's stay in rehab weekly team conferences were held to monitor patient's progress, set goals and discuss barriers to discharge. At admission, patient required minimal assist ambulate 100 feet rolling walker, moderate assist sit to stand, minimal assist sit to supine.  Moderate  assist lower body bathing set up upper body bathing set up upper body dressing moderate assist lower body dressing minimal assist toilet transfers  Physical exam.  Blood pressure 150/91 pulse 79 temperature 98.8 respirations 19 oxygen saturation 92% room air Constitutional.  Well-developed well-nourished HEENT Head.  Normocephalic and atraumatic Eyes.  Pupils round and reactive to light EOMs normal no nystagmus Neck.  Normal range of motion no thyromegaly without JVD Cardiovascular normal rate and rhythm exam reveals no friction rub or murmur heard Respiratory.  Effort normal breath sounds normal no respiratory distress no wheezes GI.  Bowel sounds normal no abdominal tenderness without rebound positive bowel sounds Musculoskeletal.  Patient with tender midfoot and ankles no obvious warmth or swelling. Neurological.  Alert sitting up in bed makes good eye contact he does follow simple commands oriented to self only did not know that he had a wife.  Moving all extremities  He  has had improvement in activity tolerance, balance, postural control as well as ability to compensate for deficits. He has had improvement in functional use RUE/LUE  and RLE/LLE as well as improvement in awareness.  Patient with  progressive gains working with energy conservation techniques.  Patient ambulates to the restroom with rolling walker supervision with minimal cues for safety and hand placement.  Perform peri-care seated on toilet.  Sit to stand during sessions from bed and toilet at supervision levels he can increase his ambulation up to 200 feet with sessions.  Gather belongings for activities the living homemaking however needed some cues for safety.  Speech therapy follow-up skilled treatment sessions focused on cognitive goals by providing extra time and minimal verbal cues and visual cues for organizational problem-solving during completion of complex money management tasks but overall is modified independent for attention to task.  Full family teaching completed plan discharge to home       Disposition: Discharge disposition: 01-Home or Self Care     Discharge to home   Diet: Diabetic diet  Special Instructions: No driving smoking or alcohol  No aspirin or blood thinning products  Home health nurse to check [facility] 12/16/2018 results to Dr.Swartz [phone]  Medications at discharge. 1.  Tylenol as needed 2.  Allopurinol 150 mg p.o. daily 3.  Amiodarone 200 mg p.o. daily 4.  Norvasc 10 mg p.o. daily 5.  Lipitor 10 mg p.o. nightly 6.  Coreg 12.5 mg p.o. twice daily with meals 7.  Colchicine 0.6 mg p.o. twice daily prn 8.  Voltaren gel 4 g 4 times a day to affected area 9.  Depakote 500 mg p.o. every 12 hours 10.  Amaryl 2 mg daily with breakfast 11.  Hydrocodone 1 tablet every 6 hours as needed moderate pain 12.  Cozaar 25 mg p.o. daily 13.  Dulera 2 puffs twice daily 14.  Prednisone 10 mg p.o. twice daily taper as directed 15.  Seroquel 25 mg p.o. nightly  Discharge Instructions    Ambulatory referral to Neurology   Complete by: As directed    An appointment is requested in approximately follow-up 4 to 6 weeks ICH with seizure/encephalopathy   Ambulatory referral to Physical Medicine  Rehab   Complete by: As directed    Moderate complexity follow-up 1 to 2 weeks ICH      Follow-up Information    Meredith Staggers, MD Follow up.   Specialty: Physical Medicine and Rehabilitation Why: Office to call for appointment Contact information: [address] [place] [address] [phone]  Chesley Mires, MD Follow up.   Specialty: Pulmonary Disease Why: Call for appointment Contact information: [address] [place] [address] [phone]        [name], MD Follow up.   Specialty: Cardiology Why: As needed Contact information: [address] [place] [address] [phone]           Signed: Cathlyn Parsons 12/12/2018, 5:27 AM

## 2018-12-11 NOTE — Progress Notes (Signed)
[place] PHYSICAL MEDICINE & REHABILITATION PROGRESS NOTE   Subjective/Complaints:  Slept better last night with his home CPAP mask. Feet feeling better too  ROS: Patient denies fever, rash, sore throat, blurred vision, nausea, vomiting, diarrhea, cough, shortness of breath or chest pain, joint or back pain, headache, or mood change.    Objective:   No results found. No results for input(s): WBC, HGB, HCT, PLT in the last 72 hours. Recent Labs    12/11/18 0516  NA 127*  K 5.3*  CL 93*  CO2 24  GLUCOSE 321*  BUN 30*  CREATININE 1.53*  CALCIUM 8.2*    Intake/Output Summary (Last 24 hours) at 12/11/2018 0954 Last data filed at 12/11/2018 0520 Gross per 24 hour  Intake 340 ml  Output 1400 ml  Net -1060 ml     Physical Exam: Vital Signs Blood pressure (!) 158/83, pulse 95, temperature 98 F (36.7 C), temperature source Oral, resp. rate 12, height 5\' 11"  (1.803 m), weight 100.2 kg, SpO2 96 %. Constitutional: No distress . Vital signs reviewed. HEENT: EOMI, oral membranes moist Neck: supple Cardiovascular: RRR without murmur. No JVD    Respiratory: CTA Bilaterally without wheezes or rales. Normal effort    GI: BS +, non-tender, non-distended  Musculoskeletal:  Comments: no pain to palpation in feet. No swelling or erythema today Neurological: He is alert.follows commands. Oriented to hospital. [facility] [place]   Skin: Skin iswarmand dry.  Psychiatric: pleasant   Assessment/Plan: 1. Functional deficits secondary to recent right frontal ICH/seizures and resulting encephalopathy which require 3+ hours per day of interdisciplinary therapy in a comprehensive inpatient rehab setting.  Physiatrist is providing close team supervision and 24 hour management of active medical problems listed below.  Physiatrist and rehab team continue to assess barriers to discharge/monitor patient progress toward functional and medical goals  Care Tool:  Bathing    Body parts bathed  by patient: Right arm, Right lower leg, Left arm, Left lower leg, Chest, Abdomen, Face, Front perineal area, Right upper leg, Left upper leg, Buttocks   Body parts bathed by helper: Buttocks     Bathing assist Assist Level: Contact Guard/Touching assist     Upper Body Dressing/Undressing Upper body dressing   What is the patient wearing?: Pull over shirt    Upper body assist Assist Level: Set up assist    Lower Body Dressing/Undressing Lower body dressing      What is the patient wearing?: Pants, Underwear/pull up     Lower body assist Assist for lower body dressing: Contact Guard/Touching assist     Toileting Toileting    Toileting assist Assist for toileting: Contact Guard/Touching assist     Transfers Chair/bed transfer  Transfers assist  Chair/bed transfer activity did not occur: Safety/medical concerns  Chair/bed transfer assist level: Contact Guard/Touching assist     Locomotion Ambulation   Ambulation assist   Ambulation activity did not occur: Safety/medical concerns  Assist level: Supervision/Verbal cueing Assistive device: Walker-rolling Max distance: 150'   Walk 10 feet activity   Assist  Walk 10 feet activity did not occur: Safety/medical concerns  Assist level: Supervision/Verbal cueing Assistive device: Walker-rolling   Walk 50 feet activity   Assist Walk 50 feet with 2 turns activity did not occur: Safety/medical concerns  Assist level: Supervision/Verbal cueing Assistive device: Walker-rolling    Walk 150 feet activity   Assist Walk 150 feet activity did not occur: Safety/medical concerns  Assist level: Supervision/Verbal cueing Assistive device: Walker-rolling    Walk 10 feet  on uneven surface  activity   Assist Walk 10 feet on uneven surfaces activity did not occur: Safety/medical concerns         Wheelchair     Assist Will patient use wheelchair at discharge?: Yes Type of Wheelchair: Manual     Wheelchair assist level: Supervision/Verbal cueing Max wheelchair distance: 150'    Wheelchair 50 feet with 2 turns activity    Assist        Assist Level: Supervision/Verbal cueing   Wheelchair 150 feet activity     Assist      Assist Level: Supervision/Verbal cueing   Blood pressure (!) 158/83, pulse 95, temperature 98 F (36.7 C), temperature source Oral, resp. rate 12, height 5\' 11"  (1.803 m), weight 100.2 kg, SpO2 96 %.  Medical Problem List and Plan: 1.Decreased functional ability with altered mental statussecondary to recent right frontal ICH with seizureand associated encephalopathy ---Continue CIR therapies including PT, OT, and SLP    - ongoing but improving cognitive deficits  -continue telesitter for safety 2. Antithrombotics: -DVT/anticoagulation:SCD.Eliquis on hold due to [place]. -antiplatelet therapy: N/A 3. Pain Management:Hydrocodone 1 tablet every 6 hours as needed moderate pain  -Gout of bilateral feet improved on increased prednisone--begin weaning again soon  - continue colchicine bid  -voltaren gel QID  -begin colchicine 0.6mg  bid, GI permitting   -xrays reviewed, negative for fx,likely some chronic changes from gout  -uric acid nl  -will introduce allopurinol    4. Mood:Provide emotional support -antipsychotic agents: Seroquel 25 mg nightlywith backup dose -continue sleep chart 5. Neuropsych: This patientis notcapable of making decisions on hisown behalf.  -source of confusion?   -100 proteus in urine:  sens to keflex--continue for 7 days 6. Skin/Wound Care:Routine skin checks 7. Fluids/Electrolytes/Nutrition:encourage PO  -push fluids   K+ 5.3 today--dc supplement 8. Atrial fibrillation. No longer on anticoagulation due to [place]. Continue amiodarone 200 mg daily. Follow-up cardiology service Dr. Harrington Challenger. HR controlled in 70's currently 9. Seizure  disorder. EEG negative. Keppra changed to Depakote500 mg every 12 hours 10. Chronic diastolic congestive heart failure.Coreg 12.5 mg twice daily, Cozaar 25 mg daily. Monitor for any signs of fluid overload -I/O's negative overall  -not sure weights have been accurate during stay especially early   -weights +/- stable Filed Weights   12/09/18 0546 12/10/18 0530 12/11/18 0521  Weight: 100.2 kg 99.6 kg 100.2 kg    Weight stable. Reading have been inconsistent at best 11. CKD stage III. Creatinine baseline 1.62.  Cr 1.65  8/24 12. COPD/CPAP. Follow-up Dr. Halford Chessman. Continue Dulera  -IS/OOB 13. Type 2 diabetes mellitus. Hemoglobin A1c 6.9. SSI. Check blood sugars before meals and at bedtime. Patient on Glucophage 1000 mg twice daily prior to admission.  CBG (last 3)  Recent Labs    12/10/18 1712 12/10/18 2018 12/11/18 0515  GLUCAP 230* 313* 314*    -with ongoing Renal dysfunction probably shouldn't be on metformin anyway. Additionally, it's not doing a lot (prednisone effect too)  -begin amaryl 1mg  daily starting today  -begin to wean steroids  -SSI 14. History of ascending thoracic aortic aneurysm. Follow-up as outpatient 15. Hyperlipidemia. Lipitor    LOS: 14 days A FACE TO FACE EVALUATION WAS PERFORMED  Meredith Staggers 12/11/2018, 9:54 AM

## 2018-12-11 NOTE — Progress Notes (Signed)
Speech Language Pathology Daily Session Note  Patient Details  Name: Ronald [name] MRN: [id number] Date of Birth: [birth year]  Today's Date: 12/11/2018 SLP Individual Time: 0930-1025 SLP Individual Time Calculation (min): 55 min  Short Term Goals: Week 2: SLP Short Term Goal 1 (Week 2): STGs = LTGs d/t short remaining ELOS  Skilled Therapeutic Interventions: Skilled treatment session focused on cognitive goals. SLP facilitated session by providing extra time and Min A verbal and visual cues for organization and problem solving during completion of a complex money management task but was overall Mod I for attention to task. Patient was independently oriented X 4 and recalled events from previous therapy sessions with Mod I. Overall, patient continues to demonstrate improved cognitive functioning with all tasks. Patient left upright in wheelchair with alarm on and all needs within reach. Continue with current plan of care.      Pain Pain Assessment Pain Scale: 0-10 Pain Score: 0-No pain Faces Pain Scale: No hurt  Therapy/Group: Individual Therapy  Anquanette Bahner 12/11/2018, 12:28 PM

## 2018-12-12 ENCOUNTER — Inpatient Hospital Stay (HOSPITAL_COMMUNITY): Payer: Federal, State, Local not specified - PPO

## 2018-12-12 ENCOUNTER — Inpatient Hospital Stay (HOSPITAL_COMMUNITY): Payer: Federal, State, Local not specified - PPO | Admitting: Speech Pathology

## 2018-12-12 ENCOUNTER — Ambulatory Visit (HOSPITAL_COMMUNITY): Payer: Federal, State, Local not specified - PPO | Admitting: Physical Therapy

## 2018-12-12 ENCOUNTER — Inpatient Hospital Stay (HOSPITAL_COMMUNITY): Payer: Federal, State, Local not specified - PPO | Admitting: Physical Therapy

## 2018-12-12 ENCOUNTER — Encounter (HOSPITAL_COMMUNITY): Payer: Federal, State, Local not specified - PPO

## 2018-12-12 LAB — BASIC METABOLIC PANEL
Anion gap: 9 (ref 5–15)
BUN: 27 mg/dL — ABNORMAL HIGH (ref 8–23)
CO2: 24 mmol/L (ref 22–32)
Calcium: 8.4 mg/dL — ABNORMAL LOW (ref 8.9–10.3)
Chloride: 96 mmol/L — ABNORMAL LOW (ref 98–111)
Creatinine, Ser: 1.37 mg/dL — ABNORMAL HIGH (ref 0.61–1.24)
GFR calc Af Amer: 59 mL/min — ABNORMAL LOW (ref >60)
GFR calc non Af Amer: 51 mL/min — ABNORMAL LOW (ref >60)
Glucose, Bld: 232 mg/dL — ABNORMAL HIGH (ref 70–99)
Potassium: 4.2 mmol/L (ref 3.5–5.1)
Sodium: 129 mmol/L — ABNORMAL LOW (ref 135–145)

## 2018-12-12 LAB — GLUCOSE, CAPILLARY
Glucose-Capillary: 210 mg/dL — ABNORMAL HIGH (ref 70–99)
Glucose-Capillary: 149 mg/dL — ABNORMAL HIGH (ref 70–99)
Glucose-Capillary: 209 mg/dL — ABNORMAL HIGH (ref 70–99)
Glucose-Capillary: 263 mg/dL — ABNORMAL HIGH (ref 70–99)

## 2018-12-12 MED ORDER — AMIODARONE HCL 200 MG PO TABS: 200.0000 mg | ORAL_TABLET | Freq: Every day | ORAL | 2 refills | Status: DC

## 2018-12-12 MED ORDER — GLIMEPIRIDE 2 MG PO TABS
2.0000 mg | ORAL_TABLET | Freq: Every day | ORAL | Status: DC
  Administered 2018-12-13: 2 mg via ORAL
  Filled 2018-12-12: qty 1

## 2018-12-12 MED ORDER — CARVEDILOL 12.5 MG PO TABS: 12.5000 mg | ORAL_TABLET | Freq: Two times a day (BID) | ORAL | 1 refills | Status: DC

## 2018-12-12 MED ORDER — ALLOPURINOL 300 MG PO TABS: 150.0000 mg | ORAL_TABLET | Freq: Every day | ORAL | 0 refills | Status: DC

## 2018-12-12 MED ORDER — QUETIAPINE FUMARATE 25 MG PO TABS: 25.0000 mg | ORAL_TABLET | Freq: Every day | ORAL | 0 refills | Status: DC

## 2018-12-12 MED ORDER — ATORVASTATIN CALCIUM 10 MG PO TABS: ORAL_TABLET | ORAL | 1 refills | Status: DC

## 2018-12-12 MED ORDER — LOSARTAN POTASSIUM 25 MG PO TABS: 25.0000 mg | ORAL_TABLET | Freq: Every day | ORAL | 3 refills | Status: DC

## 2018-12-12 MED ORDER — BUDESONIDE-FORMOTEROL FUMARATE 160-4.5 MCG/ACT IN AERO: 2.0000 | INHALATION_SPRAY | Freq: Two times a day (BID) | RESPIRATORY_TRACT | 12 refills | Status: DC

## 2018-12-12 MED ORDER — COLCHICINE 0.6 MG PO TABS: 0.6000 mg | ORAL_TABLET | Freq: Two times a day (BID) | ORAL | Status: DC | PRN

## 2018-12-12 MED ORDER — DICLOFENAC SODIUM 1 % TD GEL: 4.0000 g | Freq: Four times a day (QID) | TRANSDERMAL | 1 refills | Status: DC

## 2018-12-12 MED ORDER — AZELASTINE HCL 0.1 % NA SOLN: 1.0000 | Freq: Two times a day (BID) | NASAL | 12 refills | Status: DC | PRN

## 2018-12-12 MED ORDER — ACETAMINOPHEN 325 MG PO TABS: 650.0000 mg | ORAL_TABLET | Freq: Four times a day (QID) | ORAL | Status: DC | PRN

## 2018-12-12 MED ORDER — DIVALPROEX SODIUM 500 MG PO DR TAB: 500.0000 mg | DELAYED_RELEASE_TABLET | Freq: Two times a day (BID) | ORAL | 1 refills | Status: DC

## 2018-12-12 MED ORDER — MONTELUKAST SODIUM 10 MG PO TABS: 10.0000 mg | ORAL_TABLET | Freq: Every evening | ORAL | 0 refills | Status: DC

## 2018-12-12 MED ORDER — PREDNISONE 10 MG PO TABS: ORAL_TABLET | ORAL | 0 refills | Status: DC

## 2018-12-12 MED ORDER — ACCU-CHEK AVIVA PLUS W/DEVICE KIT: PACK | 1 refills | Status: AC

## 2018-12-12 MED ORDER — ACCU-CHEK AVIVA PLUS VI STRP: ORAL_STRIP | 3 refills | Status: AC

## 2018-12-12 MED ORDER — GLIMEPIRIDE 2 MG PO TABS: 2.0000 mg | ORAL_TABLET | Freq: Every day | ORAL | 0 refills | Status: DC

## 2018-12-12 MED ORDER — COLCHICINE 0.6 MG PO TABS: 0.6000 mg | ORAL_TABLET | Freq: Two times a day (BID) | ORAL | 0 refills | Status: DC | PRN

## 2018-12-12 MED ORDER — HYDROCODONE-ACETAMINOPHEN 5-325 MG PO TABS: 1.0000 | ORAL_TABLET | Freq: Four times a day (QID) | ORAL | 0 refills | Status: DC | PRN

## 2018-12-12 MED ORDER — AMLODIPINE BESYLATE 10 MG PO TABS: 10.0000 mg | ORAL_TABLET | Freq: Every day | ORAL | 0 refills | Status: DC

## 2018-12-12 MED ORDER — ACCU-CHEK SOFT TOUCH LANCETS MISC: 2 refills | Status: AC

## 2018-12-12 MED ORDER — COLCHICINE 0.6 MG PO TABS: 0.6000 mg | ORAL_TABLET | Freq: Two times a day (BID) | ORAL | 0 refills | Status: DC

## 2018-12-12 NOTE — Progress Notes (Signed)
  Patient ID: GILAD VANWIE, male   DOB: 1946/02/01, 73 y.o.   MRN: [id number]      Diagnosis codes:  G93.40;  I69.098;  I50.42  Height:  5'11"              Weight:    220 lbs        Patient suffers from right ICH, encephalopathy and CHF which impairs their ability to perform daily activities like bathing, dressing and mobility in the home.  A walker will not resolve issue with performing activities of daily living.  A wheelchair will allow patient to safely perform daily activities.  Patient is not able to propel themselves in the home using a standard weight wheelchair due to general weakness.  Patient can self propel in the lightweight wheelchair.  Lauraine Rinne, PA-C

## 2018-12-12 NOTE — Plan of Care (Signed)
  Problem: Consults Goal: RH GENERAL PATIENT EDUCATION Description: See Patient Education module for education specifics. Outcome: Progressing   Problem: RH BLADDER ELIMINATION Goal: RH STG MANAGE BLADDER WITH ASSISTANCE Description: STG Manage Bladder With  Min Assistance Outcome: Progressing   Problem: RH SAFETY Goal: RH STG ADHERE TO SAFETY PRECAUTIONS W/ASSISTANCE/DEVICE Description: STG Adhere to Safety Precautions With Cue and REmindwersAssistance/Device. Outcome: Progressing   Problem: RH PAIN MANAGEMENT Goal: RH STG PAIN MANAGED AT OR BELOW PT'S PAIN GOAL Description: <3/10 Outcome: Progressing   Problem: RH KNOWLEDGE DEFICIT GENERAL Goal: RH STG INCREASE KNOWLEDGE OF SELF CARE AFTER HOSPITALIZATION Outcome: Progressing

## 2018-12-12 NOTE — Progress Notes (Signed)
Physical Therapy Discharge Summary  Patient Details  Name: Ronald [name] MRN: [id number] Date of Birth: 09-12-1945  Today's Date: 12/12/2018 PT Individual Time: 7096-2836 and 6294-7654 PT Individual Time Calculation (min): 23 min and 30 min   Patient has met 10 of 11 long term goals due to improved activity tolerance, improved balance, improved postural control, increased strength, decreased pain, ability to compensate for deficits, improved awareness and improved coordination.  Patient to discharge at an ambulatory level supervision with RW.   Patient's care partner is independent to provide the necessary physical and cognitive assistance at discharge.  Reasons goals not met: pt requires min cuing for day to day recall 2/2 impaired memory/cognition  Recommendation:  Patient will benefit from ongoing skilled PT services in home health setting to continue to advance safe functional mobility, address ongoing impairments in balance, cognition, gait with LRAD, endurance, and minimize fall risk.  Equipment: w/c for community use  Reasons for discharge: treatment goals met and discharge from hospital  Patient/family agrees with progress made and goals achieved: Yes   Skilled PT Treatment: Treatment 1: Pt received in bed with wife [name]) present & pt agreeable to tx. Pt reports slight unrated pain in B feet but does not request pain meds. Reviewed home safety modifications & need for supervision and to use RW at all times at home. Also discussed f/u HHPT, reviewed DME, and pt's inability to drive until cleared by MD. Pt completes bed mobility with independence, sit>stand transfers with supervision, ambulates in room/bathroom with RW & supervision and has continent void standing at toilet with supervision. Pt completes grooming tasks (wash hands, brush teeth) standing at sink with supervision with intermittent 1UE/no UE support and no LOB noted. Transported pt to ortho gym via w/c dependent assist  for time management. Pt completes car transfer at SUV simulated height with RW & cuing to sit then place BLE in/out of car. Pt negotiates ramp & uneven surface (mulch) with RW & supervision. Pt ambulates back to room with RW & supervision and wife providing appropriate cuing for pt to ambulate within base of RW vs forward lean. At end of session pt left sitting on EOB in handoff to OT. Pt's wife voices comfort re: pt's current mobility status and d/c tomorrow.  Treatment 2: Pt received in room & agreeable to tx, reporting 1/10 pain in B ankles with rest breaks provided PRN during session. Pt transfers sit<>stand with supervision and ambulates room>gym>dayroom>room with RW & supervision with cuing for upright posture, ambulate within base of RW, and prevent shuffled gait as pt fatigues. Pt negotiates 24 steps (6" + 3") with B rails and supervision and retrieves object from floor with CGA. Pt utilizes nu-step on level 6 x 10 minutes with all four extremities with task focusing on global strengthening & endurance. Back in room pt has continent void standing at toilet and performs hand hygiene standing at sink. At end of session pt left in w/c with alarm set & all needs in reach.   PT Discharge Precautions/Restrictions Precautions Precautions: Fall Restrictions Weight Bearing Restrictions: No  Vision/Perception  Pt wears glasses at all times at baseline. No apparent visual deficits noted.  Perception Perception: Within Functional Limits   Cognition Overall Cognitive Status: Impaired/Different from baseline Arousal/Alertness: Awake/alert Orientation Level: Oriented X4 Attention: Selective Selective Attention: Impaired Selective Attention Impairment: Functional complex Memory: Impaired Memory Impairment: Retrieval deficit;Decreased recall of new information;Decreased short term memory Awareness: Impaired Awareness Impairment: Anticipatory impairment Problem Solving: Impaired Problem Solving  Impairment: Functional complex Reasoning: Impaired Safety/Judgment: Impaired  Sensation Sensation Light Touch: Appears Intact(not formally tested) Proprioception: Appears Intact(not formally tested) Coordination Gross Motor Movements are Fluid and Coordinated: Yes Fine Motor Movements are Fluid and Coordinated: Yes Finger Nose Finger Test: good Heel Shin Test: good   Motor  Motor Motor: Abnormal postural alignment and control Motor - Skilled Clinical Observations: generalized deconditioning Motor - Discharge Observations: generalized deconditioning   Mobility Bed Mobility Bed Mobility: Rolling Right;Rolling Left;Supine to Sit;Sit to Supine Rolling Right: Independent Rolling Left: Independent Supine to Sit: Independent Sit to Supine: Independent Transfers Transfers: Sit to Stand;Stand to Sit Sit to Stand: Supervision/Verbal cueing Stand to Sit: Supervision/Verbal cueing Transfer (Assistive device): Rolling walker  Locomotion  Gait Ambulation: Yes Gait Assistance: Supervision/Verbal cueing Gait Distance (Feet): 200 Feet Assistive device: Rolling walker Gait Assistance Details: verbal cuing to ambulate within base of RW vs pushing it out in front of him Gait Gait: Yes Gait Pattern: Impaired Gait Pattern: Decreased step length - right;Decreased step length - left;Decreased stride length(slightly decreased weight shifting to L in stance phase) Stairs / Additional Locomotion Stairs: Yes Stairs Assistance: Supervision/Verbal cueing Stair Management Technique: Two rails Number of Stairs: 24 Height of Stairs: (6" + 3") Ramp: Supervision/Verbal cueing(ambulatory with RW) Wheelchair Mobility Wheelchair Mobility: No   Trunk/Postural Assessment  Cervical Assessment Cervical Assessment: Exceptions to WFL(forward head) Thoracic Assessment Thoracic Assessment: Exceptions to WFL(rounded shoulders) Lumbar Assessment Lumbar Assessment: Exceptions to WFL(posterior pelvic  tilt) Postural Control Postural Control: Deficits on evaluation Protective Responses: delayed Postural Limitations: delayed   Balance Balance Balance Assessed: Yes Static Sitting Balance Static Sitting - Balance Support: Feet supported Static Sitting - Level of Assistance: 7: Independent Dynamic Standing Balance Dynamic Standing - Balance Support: No upper extremity supported;During functional activity Dynamic Standing - Level of Assistance: 5: Stand by assistance Dynamic Standing - Balance Activities: (grooming at sink)  Extremity Assessment  Extremities not formally assessed but all WFL. BLE grossly 3+/5 as pt able to weight bear without buckling noted. Pt only limited by B gout pain that has greatly improved recently.   [name] 12/12/2018, 3:42 PM

## 2018-12-12 NOTE — Discharge Instructions (Signed)
Inpatient Rehab Discharge Instructions  Ronald [name] Discharge date and time: No discharge date for patient encounter.   Activities/Precautions/ Functional Status: Activity: activity as tolerated Diet: diabetic diet Wound Care: none needed Functional status:  ___ No restrictions     ___ Walk up steps independently ___ 24/7 supervision/assistance   ___ Walk up steps with assistance ___ Intermittent supervision/assistance  ___ Bathe/dress independently ___ Walk with walker     _x__ Bathe/dress with assistance ___ Walk Independently    ___ Shower independently ___ Walk with assistance    ___ Shower with assistance ___ No alcohol     ___ Return to work/school ________     COMMUNITY REFERRALS UPON DISCHARGE:    Home Health:   PT     OT     ST    RN                      Agency:  [facility]    Phone: [phone]   Medical Equipment/Items Ordered:  Wheelchair, cushion                                                      Agency/Supplier:  [facility] @ [phone]      Special Instructions: No driving smoking or alcohol  No Eliquis or blood thinning products until further notice  Home health nurse to check BMP 12/16/2018 results to Dr. Naaman Plummer [phone]   My questions have been answered and I understand these instructions. I will adhere to these goals and the provided educational materials after my discharge from the hospital.  Patient/Caregiver Signature _______________________________ Date __________  Clinician Signature _______________________________________ Date __________  Please bring this form and your medication list with you to all your follow-up doctor's appointments.

## 2018-12-12 NOTE — Progress Notes (Addendum)
PHYSICAL MEDICINE & REHABILITATION PROGRESS NOTE   Subjective/Complaints:  Pt continues to improve. Had a good night sleep. Pain is better  ROS: Patient denies fever, rash, sore throat, blurred vision, nausea, vomiting, diarrhea, cough, shortness of breath or chest pain, joint or back pain, headache, or mood change.    Objective:   No results found. No results for input(s): WBC, HGB, HCT, PLT in the last 72 hours. Recent Labs    12/11/18 0516 12/12/18 0512  NA 127* 129*  K 5.3* 4.2  CL 93* 96*  CO2 24 24  GLUCOSE 321* 232*  BUN 30* 27*  CREATININE 1.53* 1.37*  CALCIUM 8.2* 8.4*    Intake/Output Summary (Last 24 hours) at 12/12/2018 1109 Last data filed at 12/12/2018 0842 Gross per 24 hour  Intake 360 ml  Output 2000 ml  Net -1640 ml     Physical Exam: Vital Signs Blood pressure (!) 153/94, pulse 69, temperature 98 F (36.7 C), temperature source Oral, resp. rate 18, height 5\' 11"  (1.803 m), weight 100 kg, SpO2 99 %. Constitutional: No distress . Vital signs reviewed. HEENT: EOMI, oral membranes moist Neck: supple Cardiovascular: RRR without murmur. No JVD    Respiratory: CTA Bilaterally without wheezes or rales. Normal effort    GI: BS +, non-tender, non-distended  Musculoskeletal:  Comments: no pain to palpation in feet. No swelling or erythema today Neurological: He is alert.follows commands. Oriented to hospital. Ronald [name]   Skin: Skin iswarmand dry.  Psychiatric: pleasant   Assessment/Plan: 1. Functional deficits secondary to recent right frontal ICH/seizures and resulting encephalopathy which require 3+ hours per day of interdisciplinary therapy in a comprehensive inpatient rehab setting.  Physiatrist is providing close team supervision and 24 hour management of active medical problems listed below.  Physiatrist and rehab team continue to assess barriers to discharge/monitor patient progress toward functional and medical goals  Care  Tool:  Bathing    Body parts bathed by patient: Right arm, Right lower leg, Left arm, Left lower leg, Chest, Abdomen, Face, Front perineal area, Right upper leg, Left upper leg, Buttocks   Body parts bathed by helper: Buttocks     Bathing assist Assist Level: Supervision/Verbal cueing     Upper Body Dressing/Undressing Upper body dressing   What is the patient wearing?: Pull over shirt    Upper body assist Assist Level: Set up assist    Lower Body Dressing/Undressing Lower body dressing      What is the patient wearing?: Pants, Underwear/pull up     Lower body assist Assist for lower body dressing: Supervision/Verbal cueing     Toileting Toileting    Toileting assist Assist for toileting: Contact Guard/Touching assist     Transfers Chair/bed transfer  Transfers assist  Chair/bed transfer activity did not occur: Safety/medical concerns  Chair/bed transfer assist level: Supervision/Verbal cueing     Locomotion Ambulation   Ambulation assist   Ambulation activity did not occur: Safety/medical concerns  Assist level: Supervision/Verbal cueing Assistive device: Walker-rolling Max distance: 150'   Walk 10 feet activity   Assist  Walk 10 feet activity did not occur: Safety/medical concerns  Assist level: Supervision/Verbal cueing Assistive device: Walker-rolling   Walk 50 feet activity   Assist Walk 50 feet with 2 turns activity did not occur: Safety/medical concerns  Assist level: Supervision/Verbal cueing Assistive device: Walker-rolling    Walk 150 feet activity   Assist Walk 150 feet activity did not occur: Safety/medical concerns  Assist level: Supervision/Verbal cueing Assistive device: Walker-rolling  Walk 10 feet on uneven surface  activity   Assist Walk 10 feet on uneven surfaces activity did not occur: Safety/medical concerns         Wheelchair     Assist Will patient use wheelchair at discharge?: Yes Type of  Wheelchair: Manual    Wheelchair assist level: Supervision/Verbal cueing Max wheelchair distance: 150'    Wheelchair 50 feet with 2 turns activity    Assist        Assist Level: Supervision/Verbal cueing   Wheelchair 150 feet activity     Assist      Assist Level: Supervision/Verbal cueing   Blood pressure (!) 153/94, pulse 69, temperature 98 F (36.7 C), temperature source Oral, resp. rate 18, height 5\' 11"  (1.803 m), weight 100 kg, SpO2 99 %.  Medical Problem List and Plan: 1.Decreased functional ability with altered mental statussecondary to recent right frontal ICH with seizureand associated encephalopathy  -pt making cognitive and mobility gains ---Continue CIR therapies including PT, OT, and SLP   -dc home 9/5  -Patient to see Rehab MD/provider in the office for transitional care encounter in 1-2 weeks.     2. Antithrombotics: -DVT/anticoagulation:SCD.Eliquis on hold due to [place]. -antiplatelet therapy: N/A 3. Pain Management:Hydrocodone 1 tablet every 6 hours as needed moderate pain  -Gout of bilateral feet improved on increased prednisone--wean to off over next 4-6 days, currently on 10mg  bid  - continue colchicine bid  -voltaren gel QID  -colchicine 0.6mg  bid---change to prn beginning today--continue at discharge   -xrays reviewed, negative for fx,likely some chronic changes from gout  -uric acid nl  -introduced allopurinol daily---continue at discharge   -recommend follow up BMET next week 9/8 via [place] 4. Mood:Provide emotional support -antipsychotic agents: Seroquel 25 mg nightlywith backup dose -continue sleep chart 5. Neuropsych: This patientis not entirelycapable of making decisions on hisown behalf.  -improving with RX of uti   -100 proteus in urine:  Keflex completed 6. Skin/Wound Care:Routine skin checks 7. Fluids/Electrolytes/Nutrition:encourage PO  -push  fluids   K+ increased to 4.2 today 9/4 8. Atrial fibrillation. No longer on anticoagulation due to [place]. Continue amiodarone 200 mg daily. Follow-up cardiology service Dr. Harrington Challenger. HR controlled in 70's currently 9. Seizure disorder. EEG negative. Keppra changed to Depakote500 mg every 12 hours 10. Chronic diastolic congestive heart failure.Coreg 12.5 mg twice daily, Cozaar 25 mg daily. Monitor for any signs of fluid overload -I/O's negative overall  -not sure weights have been accurate during stay especially early   -weights +/- stable Filed Weights   12/10/18 0530 12/11/18 0521 12/12/18 0445  Weight: 99.6 kg 100.2 kg 100 kg    Weight stable. Reading have been inconsistent at best 11. CKD stage III. Creatinine baseline 1.62.  Cr 1.65  8/24 12. COPD/CPAP. Follow-up Dr. Halford Chessman. Continue Dulera  -IS/OOB 13. Type 2 diabetes mellitus. Hemoglobin A1c 6.9. SSI. Check blood sugars before meals and at bedtime. Patient on Glucophage 1000 mg twice daily prior to admission.  CBG (last 3)  Recent Labs    12/11/18 1658 12/11/18 2122 12/12/18 0611  GLUCAP 321* 266* 210*    -with ongoing Renal dysfunction probably shouldn't be on metformin anyway.    -began amaryl 1mg  daily starting 9/3---send home on 2mg  daily  - wean steroids  -SSI 14. History of ascending thoracic aortic aneurysm. Follow-up as outpatient 15. Hyperlipidemia. Lipitor    LOS: 15 days A FACE TO FACE EVALUATION WAS PERFORMED  Ronald [name] 12/12/2018, 11:09 AM

## 2018-12-12 NOTE — Progress Notes (Signed)
Speech Language Pathology Discharge Summary  Patient Details  Name: Ronald [name] MRN: [id number] Date of Birth: 25-May-1945  Today's Date: 12/12/2018 SLP Individual Time: 1100-1155 SLP Individual Time Calculation (min): 55 min   Skilled Therapeutic Interventions:  Skilled treatment session focused on cognitive goals and completion of family education with the patient's wife. SLP facilitated session by re-administering the Cognistat. Patient scored [facility] for all subtests with the exception of borderline mild memory deficits. Both the patient and his wife were educated in regards to patient's current mild cognitive deficits and strategies to utilize at home to maximize recall and overall safety at home, especially in regards to 24 hour supervision. Both verbalized understanding and handouts were also given to reinforce information. Patient left upright in wheelchair with wife present.   Patient has met 6 of 6 long term goals.  Patient to discharge at [facility] level.   Reasons goals not met: N/A   Clinical Impression/Discharge Summary: Patient has made functional gains and has met 6 of 6 LTGs this admission. Currently, patient requires overall Min A verbal and visual cues for functional problem solving, recall of daily information with use of external aids, emergent awareness of errors and selective attention. Patient and family education is complete and patient will discharge home with 24 hour supervision from wife. Patient would benefit from f/u SLP services to maximize his cognitive functioning and overall functional independence in order to reduce caregiver burden.   Care Partner:  Caregiver Able to Provide Assistance: Yes  Type of Caregiver Assistance: Physical;Cognitive  Recommendation:  24 hour supervision/assistance;Home Health SLP  Rationale for SLP Follow Up: Maximize cognitive function and independence;Reduce caregiver burden   Equipment: N/A   Reasons for discharge: Treatment  goals met;Discharged from hospital   Patient/Family Agrees with Progress Made and Goals Achieved: Yes    [place], [place] 12/12/2018, 10:18 AM

## 2018-12-12 NOTE — Progress Notes (Signed)
Occupational Therapy Discharge Summary  Patient Details  Name: Ronald [name] MRN: [id number] Date of Birth: December 05, 1945  Today's Date: 12/12/2018 OT Individual Time: 9024-0973 OT Individual Time Calculation (min): 58 min    1:1. Pt received in w/c with no c/o pain. Pt completes all BADL items and transfers at supervision-set up level using RW for ambulation throughout. Pt completes functional mobility to ADL apartment for kitchen search activity with MOD VC overall for RW positioning and management while reaching dynamically into cabinets and appliances for dynamic balance simulation during IADLs. Pt stands on compliant surface with S to play UNO jenga game with mod fading to no VC for rule following in novel task. Exited session with pt seated in w/c, belt alarm on and call light in reach   Patient has met 7 of 7 long term goals due to improved activity tolerance, improved balance, postural control, ability to compensate for deficits, improved attention, improved awareness and improved coordination.  Patient to discharge at overall Supervision level.  Patient's care partner is independent to provide the necessary cognitive assistance at discharge.    Reasons goals not met: n/a  Recommendation:  Patient will benefit from ongoing skilled OT services in home health setting to continue to advance functional skills in the area of BADL and iADL.  Equipment: No equipment provided  Reasons for discharge: treatment goals met  Patient/family agrees with progress made and goals achieved: Yes  OT Discharge Precautions/Restrictions  Precautions Precautions: Fall Restrictions Weight Bearing Restrictions: No General   Vital Signs  Pain Pain Assessment Pain Scale: 0-10 Pain Score: 0-No pain Faces Pain Scale: No hurt ADL ADL Eating: Independent Grooming: Modified independent Where Assessed-Grooming: Sitting at sink Upper Body Bathing: Setup Where Assessed-Upper Body Bathing:  Shower Lower Body Bathing: Supervision/safety Where Assessed-Lower Body Bathing: Shower Upper Body Dressing: Modified independent (Device) Where Assessed-Upper Body Dressing: Sitting at sink Lower Body Dressing: Supervision/safety Where Assessed-Lower Body Dressing: Sitting at sink, Standing at sink Toileting: Supervision/safety Where Assessed-Toileting: Glass blower/designer: Close supervision Toilet Transfer Method: Arts development officer: Energy manager: Close supervision Social research officer, government Method: Ambulating Vision Baseline Vision/History: Wears glasses Wears Glasses: At all times Vision Assessment?: No apparent visual deficits Eye Alignment: Within Functional Limits Ocular Range of Motion: Within Functional Limits;Other (comment) Alignment/Gaze Preference: Within Defined Limits Perception  Perception: Within Functional Limits Praxis Praxis: Intact Cognition Overall Cognitive Status: Impaired/Different from baseline Arousal/Alertness: Awake/alert Orientation Level: Oriented X4 Attention: Selective Focused Attention: Appears intact Sustained Attention: Appears intact Selective Attention: Impaired Selective Attention Impairment: Functional complex Memory: Impaired Memory Impairment: Retrieval deficit;Decreased recall of new information;Decreased short term memory Decreased Short Term Memory: Functional complex Awareness: Impaired Awareness Impairment: Anticipatory impairment Problem Solving: Impaired Problem Solving Impairment: Functional complex Reasoning: Impaired Safety/Judgment: Impaired Sensation Sensation Light Touch: Appears Intact Proprioception: Appears Intact Coordination Gross Motor Movements are Fluid and Coordinated: Yes Fine Motor Movements are Fluid and Coordinated: Yes Finger Nose Finger Test: good Heel Shin Test: good Motor  Motor Motor: Within Functional Limits Motor - Skilled Clinical Observations:  generalized deconditioning Motor - Discharge Observations: generalized deconditioning Mobility  Bed Mobility Bed Mobility: Rolling Right;Rolling Left;Supine to Sit;Sit to Supine Rolling Right: Independent Rolling Left: Independent Supine to Sit: Independent Sit to Supine: Independent Transfers Sit to Stand: Supervision/Verbal cueing Stand to Sit: Supervision/Verbal cueing  Trunk/Postural Assessment  Cervical Assessment Cervical Assessment: (forward head) Thoracic Assessment Thoracic Assessment: (rounded shoulders) Lumbar Assessment Lumbar Assessment: (posterior pelvic tilt) Postural Control Postural Control: Deficits on  evaluation Protective Responses: delayed Postural Limitations: delayed  Balance Balance Balance Assessed: Yes Static Sitting Balance Static Sitting - Balance Support: Feet supported Static Sitting - Level of Assistance: 7: Independent Dynamic Sitting Balance Dynamic Sitting - Level of Assistance: 6: Modified independent (Device/Increase time) Static Standing Balance Static Standing - Balance Support: During functional activity Static Standing - Level of Assistance: 5: Stand by assistance Dynamic Standing Balance Dynamic Standing - Balance Support: No upper extremity supported;During functional activity Dynamic Standing - Level of Assistance: 5: Stand by assistance Dynamic Standing - Balance Activities: (grooming at sink) Extremity/Trunk Assessment RUE Assessment RUE Assessment: Within Functional Limits LUE Assessment LUE Assessment: Within Functional Limits   Tonny Branch 12/12/2018, 1:14 PM

## 2018-12-12 NOTE — Progress Notes (Signed)
Occupational Therapy Session Note  Patient Details  Name: Ronald [name] MRN: [id number] Date of Birth: 01-23-1946  Today's Date: 12/12/2018 OT Individual Time: [id number] OT Individual Time Calculation (min): 26 min    Short Term Goals: Week 2:  OT Short Term Goal 1 (Week 2): STG=LTG d/t ELOS  Skilled Therapeutic Interventions/Progress Updates:    1:1. Pt and wife received with direct handoff. Pt and wife completes ambulation supervision to ADL apartment with no cuing required for safety. Pt wife able to provide appropriate safety cues for pt. OT educates on posterior method of walk in shower transfer after discussion of shower set up. Pt and wife able to complete transfer safely. Upon return to room, pt and wife provided with handout on energy conservation with education on principles and modifications of activities. Exited session with pt seated in w/c, exit alamr on and call light in reach  Therapy Documentation Precautions:  Precautions Precautions: Fall Restrictions Weight Bearing Restrictions: No General:   Vital Signs: Oxygen Therapy SpO2: 99 % O2 Device: Room Air Pain:   ADL:   Vision   Perception    Praxis   Exercises:   Other Treatments:     Therapy/Group: Individual Therapy  Tonny Branch 12/12/2018, 11:00 AM

## 2018-12-13 LAB — GLUCOSE, CAPILLARY: Glucose-Capillary: 222 mg/dL — ABNORMAL HIGH (ref 70–99)

## 2018-12-13 NOTE — Plan of Care (Signed)
  Problem: Consults Goal: RH GENERAL PATIENT EDUCATION Description: See Patient Education module for education specifics. Outcome: Completed/Met   Problem: RH BLADDER ELIMINATION Goal: RH STG MANAGE BLADDER WITH ASSISTANCE Description: STG Manage Bladder With  Min Assistance Outcome: Completed/Met   Problem: RH SAFETY Goal: RH STG ADHERE TO SAFETY PRECAUTIONS W/ASSISTANCE/DEVICE Description: STG Adhere to Safety Precautions With Cue and REmindwersAssistance/Device. Outcome: Completed/Met   Problem: RH PAIN MANAGEMENT Goal: RH STG PAIN MANAGED AT OR BELOW PT'S PAIN GOAL Description: <3/10 Outcome: Completed/Met   Problem: RH KNOWLEDGE DEFICIT GENERAL Goal: RH STG INCREASE KNOWLEDGE OF SELF CARE AFTER HOSPITALIZATION Outcome: Completed/Met

## 2018-12-13 NOTE — Progress Notes (Signed)
Patient was discharged from [id number].  Patient left floor via wheelchair escorted by nursing staff.  Patient verbalized understanding of discharge instructions as given by PA.  All patient belongings sent with patient including DME and prescriptions.  Patient appears to be in no immediate distress at this time.    Brita Romp, RN

## 2018-12-13 NOTE — Progress Notes (Signed)
Social Work Discharge Note   The overall goal for the admission was met for:   Discharge location: Yes - returning home with wife as primary caregiver  Length of Stay: Yes - 16 days (extended LOS a few days)  Discharge activity level: Yes - supervision  Home/community participation: Yes  Services provided included: MD, RD, PT, OT, SLP, RN, TR, Pharmacy, Neuropsych and SW  Financial Services: Medicare and Private Insurance: [employer]  Follow-up services arranged: Home Health: Therapist, sports, PT, OT, ST via [facility], DME: 20x18 lightweight w/c, cushion via [place] and Patient/Family has no preference for HH/DME agencies  Comments (or additional information):     Contact person:  Wife, Xayne Brumbaugh @ [phone]  Patient/Family verbalized understanding of follow-up arrangements: Yes  Individual responsible for coordination of the follow-up plan: pt/ wife  Confirmed correct DME delivered: Lennart Pall 12/13/2018    Linnell Swords

## 2018-12-14 DIAGNOSIS — Z7951 Long term (current) use of inhaled steroids: Secondary | ICD-10-CM | POA: Diagnosis not present

## 2018-12-14 DIAGNOSIS — E1122 Type 2 diabetes mellitus with diabetic chronic kidney disease: Secondary | ICD-10-CM | POA: Diagnosis not present

## 2018-12-14 DIAGNOSIS — M5030 Other cervical disc degeneration, unspecified cervical region: Secondary | ICD-10-CM | POA: Diagnosis not present

## 2018-12-14 DIAGNOSIS — G9340 Encephalopathy, unspecified: Secondary | ICD-10-CM | POA: Diagnosis not present

## 2018-12-14 DIAGNOSIS — Z9981 Dependence on supplemental oxygen: Secondary | ICD-10-CM | POA: Diagnosis not present

## 2018-12-14 DIAGNOSIS — E785 Hyperlipidemia, unspecified: Secondary | ICD-10-CM | POA: Diagnosis not present

## 2018-12-14 DIAGNOSIS — J449 Chronic obstructive pulmonary disease, unspecified: Secondary | ICD-10-CM | POA: Diagnosis not present

## 2018-12-14 DIAGNOSIS — I480 Paroxysmal atrial fibrillation: Secondary | ICD-10-CM | POA: Diagnosis not present

## 2018-12-14 DIAGNOSIS — I130 Hypertensive heart and chronic kidney disease with heart failure and stage 1 through stage 4 chronic kidney disease, or unspecified chronic kidney disease: Secondary | ICD-10-CM | POA: Diagnosis not present

## 2018-12-14 DIAGNOSIS — M1030 Gout due to renal impairment, unspecified site: Secondary | ICD-10-CM | POA: Diagnosis not present

## 2018-12-14 DIAGNOSIS — G40909 Epilepsy, unspecified, not intractable, without status epilepticus: Secondary | ICD-10-CM | POA: Diagnosis not present

## 2018-12-14 DIAGNOSIS — I712 Thoracic aortic aneurysm, without rupture: Secondary | ICD-10-CM | POA: Diagnosis not present

## 2018-12-14 DIAGNOSIS — I700 Atherosclerosis of aorta: Secondary | ICD-10-CM | POA: Diagnosis not present

## 2018-12-14 DIAGNOSIS — N183 Chronic kidney disease, stage 3 (moderate): Secondary | ICD-10-CM | POA: Diagnosis not present

## 2018-12-14 DIAGNOSIS — Z9181 History of falling: Secondary | ICD-10-CM | POA: Diagnosis not present

## 2018-12-14 DIAGNOSIS — I69198 Other sequelae of nontraumatic intracerebral hemorrhage: Secondary | ICD-10-CM | POA: Diagnosis not present

## 2018-12-14 DIAGNOSIS — M8938 Hypertrophy of bone, other site: Secondary | ICD-10-CM | POA: Diagnosis not present

## 2018-12-14 DIAGNOSIS — Z7984 Long term (current) use of oral hypoglycemic drugs: Secondary | ICD-10-CM | POA: Diagnosis not present

## 2018-12-14 DIAGNOSIS — Z79891 Long term (current) use of opiate analgesic: Secondary | ICD-10-CM | POA: Diagnosis not present

## 2018-12-14 DIAGNOSIS — I5032 Chronic diastolic (congestive) heart failure: Secondary | ICD-10-CM | POA: Diagnosis not present

## 2018-12-16 ENCOUNTER — Telehealth: Payer: Self-pay

## 2018-12-16 ENCOUNTER — Encounter: Payer: Federal, State, Local not specified - PPO | Admitting: Occupational Therapy

## 2018-12-16 DIAGNOSIS — G40909 Epilepsy, unspecified, not intractable, without status epilepticus: Secondary | ICD-10-CM | POA: Diagnosis not present

## 2018-12-16 DIAGNOSIS — I130 Hypertensive heart and chronic kidney disease with heart failure and stage 1 through stage 4 chronic kidney disease, or unspecified chronic kidney disease: Secondary | ICD-10-CM | POA: Diagnosis not present

## 2018-12-16 DIAGNOSIS — I5032 Chronic diastolic (congestive) heart failure: Secondary | ICD-10-CM | POA: Diagnosis not present

## 2018-12-16 DIAGNOSIS — I69198 Other sequelae of nontraumatic intracerebral hemorrhage: Secondary | ICD-10-CM | POA: Diagnosis not present

## 2018-12-16 DIAGNOSIS — I619 Nontraumatic intracerebral hemorrhage, unspecified: Secondary | ICD-10-CM | POA: Diagnosis not present

## 2018-12-16 DIAGNOSIS — E1122 Type 2 diabetes mellitus with diabetic chronic kidney disease: Secondary | ICD-10-CM | POA: Diagnosis not present

## 2018-12-16 DIAGNOSIS — N183 Chronic kidney disease, stage 3 (moderate): Secondary | ICD-10-CM | POA: Diagnosis not present

## 2018-12-17 NOTE — Telephone Encounter (Signed)
  [facility]  Patient Name: Ronald [name] DOB: November 29, 1945 Appointment Date and Time: 12-24-2018 / 940am With: Zella Ball first then either Dr. Naaman Plummer or Dr. Dagoberto Ligas  Questions for our staff to ask patients on Transitional care 48 hour phone call:   1. Are you/is patient experiencing any problems since coming home? NO  Are there any questions regarding any aspect of care?   2. Are there any questions regarding medications administration/dosing? NO  Are meds being taken as prescribed? YES  3. Have there been any falls? NO  4. Has Home Health been to the house and/or have they contacted you? YES  If not, have you tried to contact them? NA  Can we help you contact them? NA  5. Are bowels and bladder emptying properly? YES  Are there any unexpected incontinence issues? NO  If applicable, is patient following bowel/bladder programs? NA  6. Any fevers, problems with breathing, unexpected pain? NO  7. Are there any skin problems or new areas of breakdown? NO  8. Has the patient/family member arranged specialty MD follow up (ie cardiology/neurology/renal/surgical/etc)?   Can we help arrange? YES  9. Does the patient need any other services or support that we can help arrange? NO  10. Are caregivers following through as expected in assisting the patient? YES  11. Has the patient quit smoking, drinking alcohol, or using drugs as recommended? YES, Smoking and Alcohol  [employer] [facility] [address] [phone]

## 2018-12-18 ENCOUNTER — Encounter: Payer: Federal, State, Local not specified - PPO | Admitting: Occupational Therapy

## 2018-12-19 ENCOUNTER — Telehealth: Payer: Self-pay | Admitting: Cardiology

## 2018-12-19 DIAGNOSIS — N183 Chronic kidney disease, stage 3 (moderate): Secondary | ICD-10-CM | POA: Diagnosis not present

## 2018-12-19 DIAGNOSIS — E1122 Type 2 diabetes mellitus with diabetic chronic kidney disease: Secondary | ICD-10-CM | POA: Diagnosis not present

## 2018-12-19 DIAGNOSIS — I5032 Chronic diastolic (congestive) heart failure: Secondary | ICD-10-CM | POA: Diagnosis not present

## 2018-12-19 DIAGNOSIS — I69198 Other sequelae of nontraumatic intracerebral hemorrhage: Secondary | ICD-10-CM | POA: Diagnosis not present

## 2018-12-19 DIAGNOSIS — I130 Hypertensive heart and chronic kidney disease with heart failure and stage 1 through stage 4 chronic kidney disease, or unspecified chronic kidney disease: Secondary | ICD-10-CM | POA: Diagnosis not present

## 2018-12-19 DIAGNOSIS — G40909 Epilepsy, unspecified, not intractable, without status epilepticus: Secondary | ICD-10-CM | POA: Diagnosis not present

## 2018-12-19 NOTE — Telephone Encounter (Signed)
SPOKE TO PATIENT - AWARE COVID TEST IS NEEDED PRIOR TO PFT.  SCHEDULE FOR 12/26/18 Friday  AT 11 AM .

## 2018-12-19 NOTE — Telephone Encounter (Signed)
Please see below, Is this a test that we preform?

## 2018-12-19 NOTE — Telephone Encounter (Signed)
New message     Pt has an appt scheduled with [name] on 01-14-19 for a PFT follow up.  The PFT test has not been scheduled yet.  Please call to schedule test

## 2018-12-21 IMAGING — DX DG CHEST 2V
2 series · 2 of 2 positions shown · non-contrast
Comparison: 04/12/2015

CLINICAL DATA: Fever and cough

EXAM:
CHEST  2 VIEW

[chest pa]
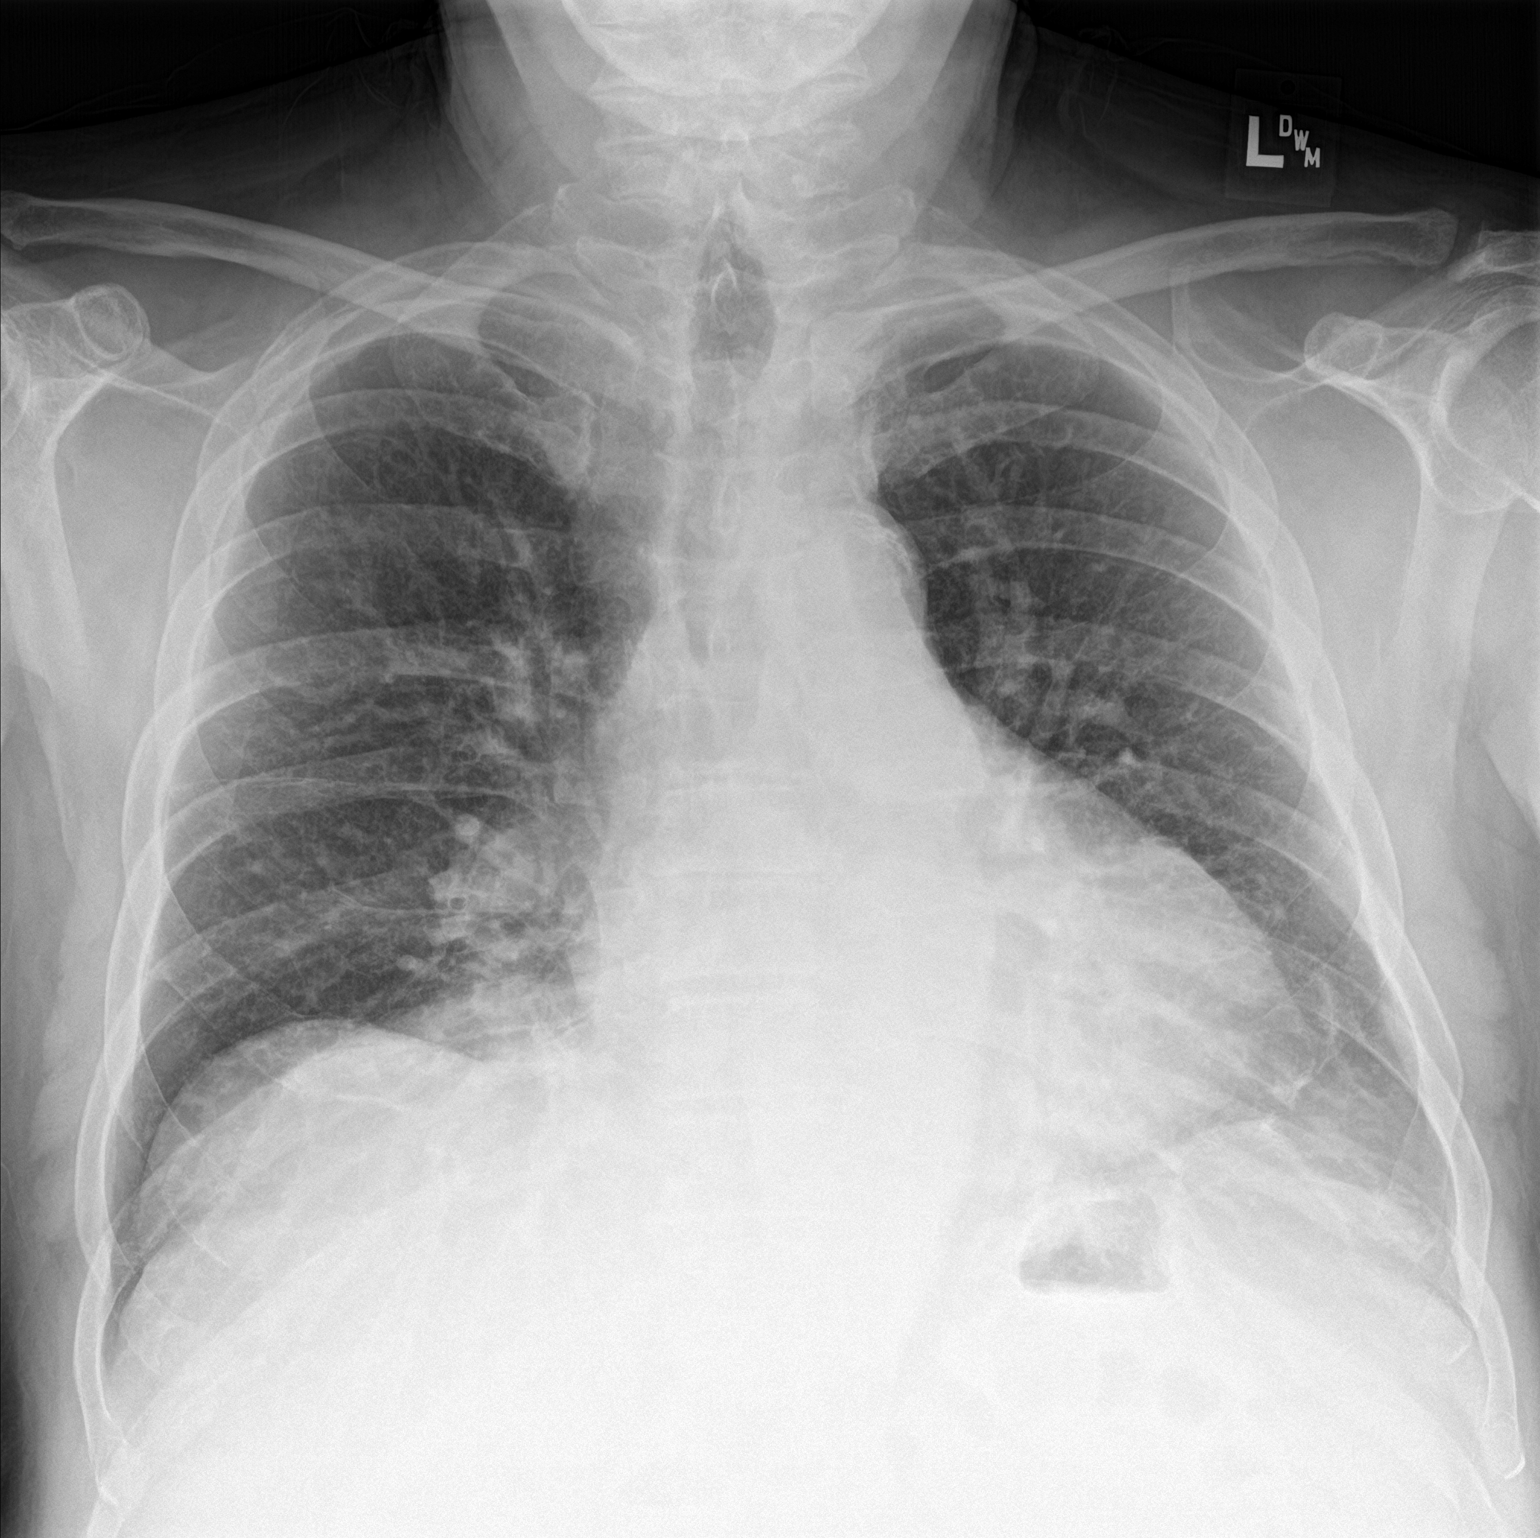

[chest lat]
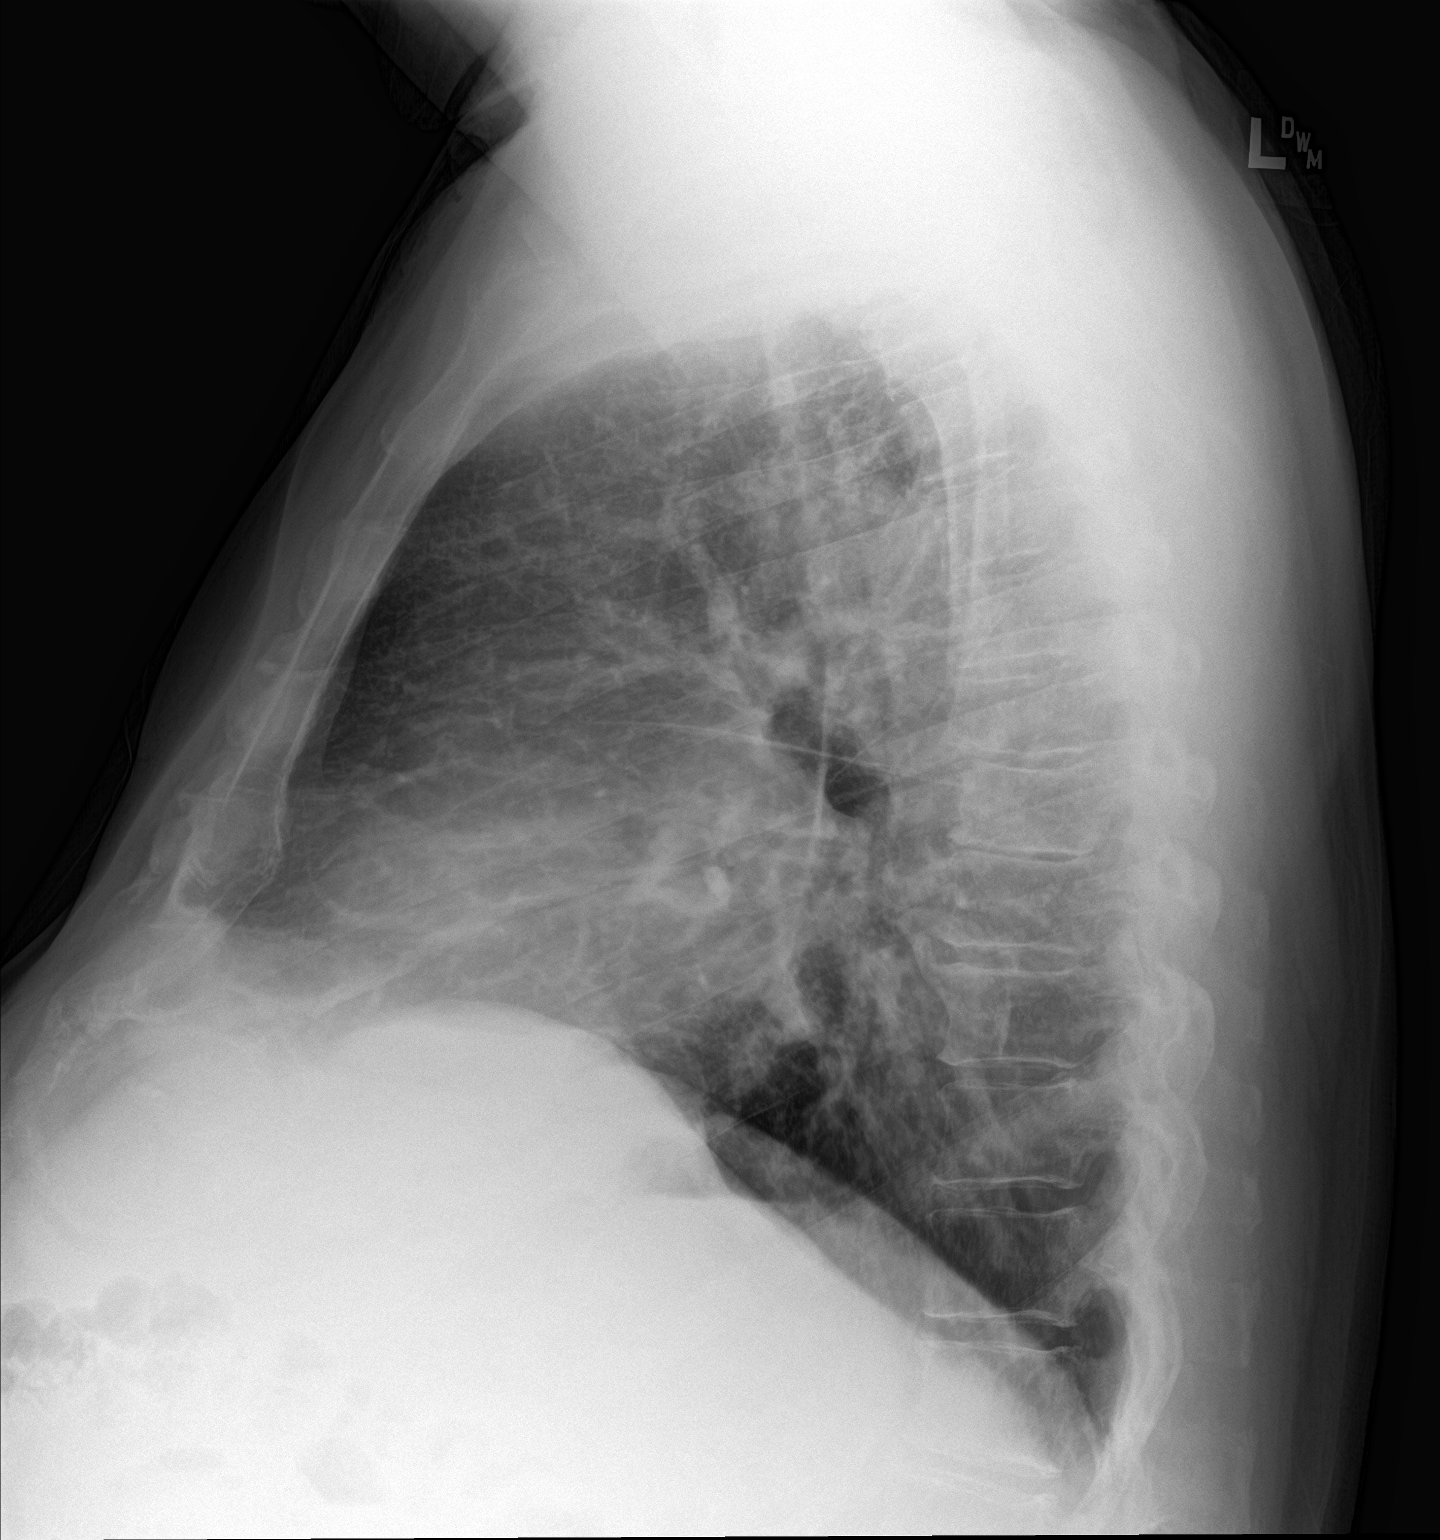

[2 of 2 positions shown; findings below may reference images not displayed]

FINDINGS: Scarring at the bilateral lung bases. No focal consolidation or
pleural effusion. Mild cardiomegaly with slight central vascular
congestion. Aortic atherosclerosis. No pneumothorax. Degenerative
changes of the spine.
IMPRESSION: 1. Scarring at the left lung base.  No acute pulmonary infiltrate
2. Borderline to mild cardiomegaly with mild central congestion

## 2018-12-23 ENCOUNTER — Encounter: Payer: Self-pay | Admitting: Family Medicine

## 2018-12-23 ENCOUNTER — Ambulatory Visit (INDEPENDENT_AMBULATORY_CARE_PROVIDER_SITE_OTHER): Payer: Medicare Other | Admitting: Family Medicine

## 2018-12-23 ENCOUNTER — Other Ambulatory Visit: Payer: Self-pay

## 2018-12-23 VITALS — BP 120/64 | HR 65 | Temp 97.7°F | Ht 71.0 in | Wt 222.1 lb

## 2018-12-23 DIAGNOSIS — I610 Nontraumatic intracerebral hemorrhage in hemisphere, subcortical: Secondary | ICD-10-CM | POA: Diagnosis not present

## 2018-12-23 DIAGNOSIS — I5032 Chronic diastolic (congestive) heart failure: Secondary | ICD-10-CM | POA: Diagnosis not present

## 2018-12-23 DIAGNOSIS — G40909 Epilepsy, unspecified, not intractable, without status epilepticus: Secondary | ICD-10-CM | POA: Diagnosis not present

## 2018-12-23 DIAGNOSIS — E1165 Type 2 diabetes mellitus with hyperglycemia: Secondary | ICD-10-CM | POA: Diagnosis not present

## 2018-12-23 DIAGNOSIS — I69198 Other sequelae of nontraumatic intracerebral hemorrhage: Secondary | ICD-10-CM | POA: Diagnosis not present

## 2018-12-23 DIAGNOSIS — R300 Dysuria: Secondary | ICD-10-CM

## 2018-12-23 DIAGNOSIS — N183 Chronic kidney disease, stage 3 unspecified: Secondary | ICD-10-CM

## 2018-12-23 DIAGNOSIS — E1122 Type 2 diabetes mellitus with diabetic chronic kidney disease: Secondary | ICD-10-CM | POA: Diagnosis not present

## 2018-12-23 DIAGNOSIS — R600 Localized edema: Secondary | ICD-10-CM | POA: Diagnosis not present

## 2018-12-23 DIAGNOSIS — I130 Hypertensive heart and chronic kidney disease with heart failure and stage 1 through stage 4 chronic kidney disease, or unspecified chronic kidney disease: Secondary | ICD-10-CM | POA: Diagnosis not present

## 2018-12-23 LAB — BASIC METABOLIC PANEL
BUN: 18 mg/dL (ref 6–23)
CO2: 28 mEq/L (ref 19–32)
Calcium: 7.9 mg/dL — ABNORMAL LOW (ref 8.4–10.5)
Chloride: 101 mEq/L (ref 96–112)
Creatinine, Ser: 1.25 mg/dL (ref 0.40–1.50)
GFR: 56.52 mL/min — ABNORMAL LOW (ref >60.00)
Glucose, Bld: 229 mg/dL — ABNORMAL HIGH (ref 70–99)
Potassium: 4.3 mEq/L (ref 3.5–5.1)
Sodium: 136 mEq/L (ref 135–145)

## 2018-12-23 LAB — POCT URINALYSIS DIPSTICK
Bilirubin, UA: NEGATIVE
Glucose, UA: POSITIVE — AB
Ketones, UA: NEGATIVE
Nitrite, UA: NEGATIVE
Protein, UA: POSITIVE — AB
Spec Grav, UA: 1.01 (ref 1.010–1.025)
Urobilinogen, UA: 0.2 E.U./dL
pH, UA: 6 (ref 5.0–8.0)

## 2018-12-23 NOTE — Progress Notes (Signed)
Subjective:     Patient ID: Ronald [name], male   DOB: 08/23/1945, 73 y.o.   MRN: [id number]  HPI Patient is here for hospital follow-up.  Patient had recent intracerebral hemorrhage right frontal cortex with small sub-arachnoid hemorrhage in the setting of Eliquis therapy.  He was admitted back in July for that.  His Eliquis was discontinued.  He had been started on Keppra and when we saw him back for follow-up August 7 he had some cognitive slowing and intermittent confusion.  His confusion worsened and he was readmitted on 11/21/2018.  There was concern whether he was having some side effects as far as mental status changes from the [place].  He had repeat EEG which did not show any epileptiform activity.  MRI showed no acute intracranial abnormalities.  He was taken off Keppra and transitioned to Depakote.  He has had steady improvement since that time.  He has had improvements cognitively as well as with balance and with ambulation.  He is currently taking about 1000 steps per day with his walker.  No falls.  He also started on Seroquel at night.  Overall much improved.  Patient did have UTI during hospitalization with Proteus mirabilis which was treated with antibiotics.  Is had some urine dribbling now but no burning with urination.  No fevers or chills.  He was transitioned to rehab and just left there last week.  He had gout flareup when there and was treated with prednisone.  Wife states that he has had 11 pound weight gain in addition has had increased bilateral leg edema since discharge.  Poor compliance with diet.  Had been on Lasix 40 mg daily and was taken off.  He reportedly had renal profile checked a week ago but we cannot find results that.  Type 2 diabetes.  He went into the hospital on metformin and Ozempic and was taken off both.  He was switched to glimepiride 2 mg daily.  His blood sugars been very high at likely related to recent prednisone.  They are starting to come back down.   Fasting glucose this morning 220.  His creatinine at discharge was around 1.6.  His last A1c was 6.9% prior to admission.  Past Medical History:  Diagnosis Date  . Allergic rhinitis   . Anticoagulant long-term use    eliquis  . Cardiomyopathy due to systemic disease [facility])    followed by dr harding  . COPD with emphysema [facility])    pulmologist-  dr Halford Chessman  . Dyspnea    occasional per pt  . History of colon polyps    tubular adenoma 2013  . History of gout    09-05-2017 last flare-up  05/ 2019 3 wks ago, feet  . History of sepsis 02/18/2017   per d/c note probable uti, acute chf, acute renal failure, hypoxia  . Hyperlipidemia   . Hyperplasia of prostate with lower urinary tract symptoms (LUTS)   . Hypertension   . OSA on CPAP    per study 08-03-2004  Severe OSA  . Persistent atrial fibrillation    cardiologist --  dr Dorris Carnes--  post cardioversion 05-07-2014  . Prostate cancer [facility]) urologsit-  dr Alyson Ingles--- as of 05-21-2017 per pt last PSA 11 approx.   Dx  2014--  stage T1c, Gleason 3+3=6, PSA 6.67--  Active surveillance/  04/ 2019  Stage T1b, Gleason 3+4, PSA 12.8- plan external radiation therapy    . 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 Seasonal allergies   . Sigmoid diverticulosis   . Systolic and diastolic CHF, chronic [facility])    cardiologist-  [name]  . Tinea versicolor   . Type 2 diabetes mellitus ([place])   . Wears hearing aid in both ears    Past Surgical History:  Procedure Laterality Date  . CARDIOVERSION N/A 05/07/2014   Procedure: CARDIOVERSION;  Surgeon: [name], MD;  Location: [facility] ENDOSCOPY;  Service: Cardiovascular;  Laterality: N/A;  . CARDIOVERSION N/A 09/09/2014   Procedure: CARDIOVERSION;  Surgeon: Lelon Perla, MD;  [facility] END[facility]COPY;  Service: Cardiovascular;  Laterality: N/A;  successfully  . CATARACT EXTRACTION W/ INTRAOCULAR LENS  IMPLANT, BILATERAL  08 and 09/  2018  . COLONOSCOPY   last one 06-07-2011  . GOLD SEED IMPLANT N/A 09/09/2017   Procedure: GOLD SEED IMPLANT;  Surgeon: [name], MD;  Location: [facility];  Service: Urology;  Laterality: N/A;  . HYDROCELE EXCISION Bilateral 09/26/2015   Procedure: HYDROCELECTOMY ADULT;  Surgeon: [name], MD;  Location: [facility];  Service: Urology;  Laterality: Bilateral;  . LAMINECTOMY AND MICRODISCECTOMY LUMBAR SPINE  12-23-2003   Left L5 -- S1  . LAPAROSCOPIC BILATERAL INGUINAL HERNIA REPAIR/  UMBILICAL HERNIA REPAIR WITH MESH/  ASPIRATION LEFT HYDROCELE  07-11-2012  . NM MYOVIEW LTD  05/18/2014   Low risk study. Normal perfusion: No ischemia or infarction. Mild LV dysfunction - 46% (does not correlate with echocardiographic EF of 50-55%)  . PROSTATE BIOPSY  05/15/12   Clinically both Lobes  . SPACE OAR INSTILLATION N/A 09/09/2017   Procedure: SPACE OAR INSTILLATION;  Surgeon: [name], MD;  Location: [facility];  Service: Urology;  Laterality: N/A;  . TEE WITHOUT CARDIOVERSION N/A 05/07/2014   Procedure: TRANSESOPHAGEAL ECHOCARDIOGRAM (TEE);  Surgeon: [name], MD;  Location: [facility] ENDOSCOPY;  Service: Cardiovascular;  Laterality: N/A;   mild atherosclerosis plaque of aorta,  mild AR, MR, and TR,  no cardiac source of emboli  . TONSILLECTOMY  as child  . TRANSTHORACIC ECHOCARDIOGRAM  11/'18; 1/'19   a) In setting of sepsis: EF of 40-45%.  Diffuse hypokinesis.  No RWMA.  Biatrial enlargement.;; b) ** f/u Jan 2019: Normal LVF 55-60%** , mildly dilated aortic root(38 mm) and ascending aorta (44 mm). Compared to prior echo, LVEF has improved.  . TRANSURETHRAL RESECTION OF PROSTATE N/A 05/30/2017   Procedure: TRANSURETHRAL RESECTION OF THE PROSTATE (TURP);  Surgeon: [name], MD;  Location: [facility];  Service: Urology;  Laterality: N/A;    reports that he has been smoking cigarettes. He has a 15.00 pack-year smoking history. He has never used  smokeless tobacco. He reports current alcohol use of about 14.0 standard drinks of alcohol per week. He reports that he does not use drugs. family history includes Breast cancer in his mother; Ovarian cancer in his sister; Stroke in an other family member. No Known Allergies   Review of Systems  Constitutional: Negative for appetite change, chills, fever and unexpected weight change.  Respiratory: Negative for cough and shortness of breath.   Cardiovascular: Positive for leg swelling. Negative for chest pain and palpitations.  Gastrointestinal: Negative for abdominal pain, nausea and vomiting.  Genitourinary: Positive for dysuria. Negative for hematuria.  Neurological: Negative for dizziness and headaches.  Psychiatric/Behavioral: Negative for agitation.       Objective:   Physical Exam Constitutional:      Appearance: Normal appearance.  Neck:  Musculoskeletal: Neck supple.  Cardiovascular:     Rate and Rhythm: Normal rate and regular rhythm.  Pulmonary:     Effort: Pulmonary effort is normal.     Breath sounds: Normal breath sounds. No wheezing.  Musculoskeletal:     Right lower leg: Edema present.     Left lower leg: Edema present.     Comments: He has 1-2+ pitting edema lower legs bilaterally  Neurological:     General: No focal deficit present.     Mental Status: He is alert and oriented to person, place, and time.        Assessment:     #1 recent intracerebral hemorrhage right frontal region in the setting of Eliquis for atrial fibrillation.  Now off anticoagulation.  #2 recent cognitive changes and behavioral disturbance possibly related to #1 and/or Keppra.  He is improving off Keppra and appears much clearer cognitively today than last time we saw him  #3 high risk for falls.  Using walker consistently  #4 type 2 diabetes.  Recent worsening related to prednisone and also discontinuation of his chronic medications.  He is on glimepiride as above  #5  bilateral leg edema worsened after discontinuation of Lasix with recent 11 pound weight gain since discharge    Plan:     -Recheck basic metabolic panel today -Recommend daily weights -Start back Lasix 40 mg once a day and monitor renal function closely -Continue to monitor blood sugars closely.  We may have to add back metformin if creatinine stable.  We could start back Ozempic as well but we will see how his blood sugars are doing first on the above -Continue close follow-up with neurology and rehab -Office follow-up in 2 weeks to reassess his edema  [name] MD [facility]

## 2018-12-23 NOTE — Patient Instructions (Signed)
Start back the Lasix 40 mg once daily  Do daily weights  Watch salt intake.  Try to keep sodium < 2500 mg per day.

## 2018-12-24 ENCOUNTER — Other Ambulatory Visit: Payer: Self-pay

## 2018-12-24 ENCOUNTER — Encounter: Payer: Medicare Other | Attending: Registered Nurse | Admitting: Registered Nurse

## 2018-12-24 ENCOUNTER — Encounter: Payer: Self-pay | Admitting: Registered Nurse

## 2018-12-24 VITALS — BP 124/79 | HR 71 | Temp 98.5°F | Ht 71.0 in | Wt 221.6 lb

## 2018-12-24 DIAGNOSIS — I480 Paroxysmal atrial fibrillation: Secondary | ICD-10-CM | POA: Diagnosis not present

## 2018-12-24 DIAGNOSIS — N183 Chronic kidney disease, stage 3 unspecified: Secondary | ICD-10-CM

## 2018-12-24 DIAGNOSIS — G9340 Encephalopathy, unspecified: Secondary | ICD-10-CM | POA: Diagnosis not present

## 2018-12-24 DIAGNOSIS — I10 Essential (primary) hypertension: Secondary | ICD-10-CM

## 2018-12-24 DIAGNOSIS — I5032 Chronic diastolic (congestive) heart failure: Secondary | ICD-10-CM | POA: Diagnosis not present

## 2018-12-24 DIAGNOSIS — E7849 Other hyperlipidemia: Secondary | ICD-10-CM

## 2018-12-24 DIAGNOSIS — R569 Unspecified convulsions: Secondary | ICD-10-CM | POA: Diagnosis not present

## 2018-12-24 NOTE — Progress Notes (Signed)
Subjective:    Patient ID: Ronald [name], male    DOB: May 28, 1945, 73 y.o.   MRN: [id number]  HPI: Ronald [name] is a 73 y.o. male who is here for Transitional Care Visit in follow up of his encephalopathy, paroxysmal atrial fibrillation, seizures secondary to [place], chronic diastolic CHF, CKD Stage III, hypertension and hyperlipidemia. Ronald [name] presented to [facility] on 11/21/2018 for increasing confusion and off- balanced gait. Neurology was consulted.  CT Head WO Contrast:  IMPRESSION: 1. No acute or new intracranial hemorrhage. 2. Decrease in conspicuity of the right frontal hemorrhage and slight decrease in associated edema. CT Angio Head W or WO Contrast:  IMPRESSION: 1. No emergent large vessel occlusion or evidence of CNS vasculitis. 2. 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 This is new compared to the prior study. 3. 4.1 cm ascending thoracic aortic aneurysm. Recommend annual imaging followup by CTA or MRA. This recommendation follows 2010 ACCF/AHA/AATS/ACR/ASA/SCA/SCAI/SIR/STS/SVM Guidelines for the Diagnosis and Management of Patients with Thoracic Aortic Disease. Circulation. 2010; [id number]. Aortic aneurysm NOS (ICD10-I71.9) Aortic atherosclerosis (ICD10-I70.0).  CT Head WO Contrast:  IMPRESSION: 1. Continued expected evolution of the previously demonstrated intraparenchymal hemorrhage within the RIGHT frontal lobe, again less conspicuous than on earlier studies. Associated parenchymal edema within the RIGHT frontal lobe is stable in extent. No associated mass effect or midline shift. 2. No new intracranial abnormality. No new or acute intracranial hemorrhage.  MR Brain WO Contrast:  IMPRESSION: 1. Contracting subacute right frontal hematoma without vasogenic edema or mass effect. 2. No acute finding or new abnormality.  EEG: Negative for Seizure Neurology changed Keppra to Depakote.   Ronald [name] was admitted to inpatient rehabilitation on 11/27/2018 and discharged home on 12/13/2018. He is receiving outpatient therapy with [facility]. He denies any pain. He rated his pain 0. Also reports he ha a good appetite.   Wife in room all questions answered.   Pain Inventory Average Pain 0 Pain Right Now 0 My pain is n/a  In the last 24 hours, has pain interfered with the following? General activity 0 Relation with others 0 Enjoyment of life 0 What TIME of day is your pain at its worst? n/a Sleep (in general) Good  Pain is worse with: n/a Pain improves with: n/a Relief from Meds: 0  Mobility walk without assistance use a cane how many minutes can you walk? 5 do you drive?  no Do you have any goals in this area?  yes  Function not employed: date last employed . Do you have any goals in this area?  no  Neuro/Psych No problems in this area  Prior Studies Any changes since last visit?  no  Physicians involved in your care Any changes since last visit?  no   Family History  Problem Relation Age of Onset  . Breast cancer Mother   . Stroke Other        Unknown   . Ovarian cancer Sister   . Colon cancer Neg Hx   . Esophageal cancer Neg Hx   . Rectal cancer Neg Hx   . Stomach cancer Neg Hx    Social History   Socioeconomic History  . Marital status: Married    Spouse name: Not on file  . Number of children: Not on file  . Years of education: Not on file  . Highest education level: Not on file  Occupational History  . Occupation: Press photographer  Employer: [employer]  Social Needs  . Financial resource strain: Not on file  . Food insecurity    Worry: Not on file    Inability: Not on file  . Transportation needs    Medical: Not on file    Non-medical: Not on file  Tobacco Use  . Smoking status: Current Every Day Smoker    Packs/day: 0.25    Years: 60.00    Pack years: 15.00    Types: Cigarettes    Last attempt to quit: 04/11/2018    Years  since quitting: 0.7  . Smokeless tobacco: Never Used  . Tobacco comment: 09-05-2017 tried quitting smoking 11/ 2018 but started back at 0.75ppd from 1ppd  Substance and Sexual Activity  . Alcohol use: Yes    Alcohol/week: 14.0 standard drinks    Types: 14 Standard drinks or equivalent per week    Comment: 2 drinks daily  . Drug use: No  . Sexual activity: Not on file  Lifestyle  . Physical activity    Days per week: Not on file    Minutes per session: Not on file  . Stress: Not on file  Relationships  . Social Herbalist on phone: Not on file    Gets together: Not on file    Attends religious service: Not on file    Active member of club or organization: Not on file    Attends meetings of clubs or organizations: Not on file    Relationship status: Not on file  Other Topics Concern  . Not on file  Social History Narrative   Married 25 years   2 children but not with this wife   Past Surgical History:  Procedure Laterality Date  . CARDIOVERSION N/A 05/07/2014   Procedure: CARDIOVERSION;  Surgeon: [name], MD;  Location[facility] ENDOSCOPY;  Service: Cardiovascular;  Laterality: N/A;  . CARDIOVERSION N/A 09/09/2014   Procedure: CARDIOVERSION;  Surgeon: Lelon Perla, MD;  Location: [facility] ENDOSCOPY;  Service: Cardiovascular;  Laterality: N/A;  successfully  . CATARACT EXTRACTION W/ INTRAOCULAR LENS  IMPLANT, BILATERAL  08 and 09/  2018  . COLONOSCOPY  last one 06-07-2011  . GOLD SEED IMPLANT N/A 09/09/2017   Procedure: GOLD SEED IMPLANT;  Surgeon: [name], MD;  Location: [facility];  Service: Urology;  Laterality: N/A;  . HYDROCELE EXCISION Bilateral 09/26/2015   Procedure: HYDROCELECTOMY ADULT;  Surgeon: [name], MD;  Location: [facility];  Service: Urology;  Laterality: Bilateral;  . LAMINECTOMY AND MICRODISCECTOMY LUMBAR SPINE  12-23-2003   Left L5 -- S1  . LAPAROSCOPIC BILATERAL INGUINAL HERNIA REPAIR/  UMBILICAL  HERNIA REPAIR WITH MESH/  ASPIRATION LEFT HYDROCELE  07-11-2012  . NM MYOVIEW LTD  05/18/2014   Low risk study. Normal perfusion: No ischemia or infarction. Mild LV dysfunction - 46% (does not correlate with echocardiographic EF of 50-55%)  . PROSTATE BIOPSY  05/15/12   Clinically both Lobes  . SPACE OAR INSTILLATION N/A 09/09/2017   Procedure: SPACE OAR INSTILLATION;  Surgeon: [name], MD;  Location[facility];  Service: Urology;  Laterality: N/A;  . TEE WITHOUT CARDIOVERSION N/A 05/07/2014   Procedure: TRANSESOPHAGEAL ECHOCARDIOGRAM (TEE);  Surgeon: [name], MD;  Location: [facility] ENDOSCOPY;  Service: Cardiovascular;  Laterality: N/A;   mild atherosclerosis plaque of aorta,  mild AR, MR, and TR,  no cardiac source of emboli  . TONSILLECTOMY  as child  . TRANSTHORACIC ECHOCARDIOGRAM  11/'18; 1/'19   a) In setting of sepsis: EF of 40-45%.  Diffuse hypokinesis.  No RWMA.  Biatrial enlargement.;; b) ** f/u Jan 2019: Normal LVF 55-60%** , mildly dilated aortic root(38 mm) and ascending aorta (44 mm). Compared to prior echo, LVEF has improved.  . TRANSURETHRAL RESECTION OF PROSTATE N/A 05/30/2017   Procedure: TRANSURETHRAL RESECTION OF THE PROSTATE (TURP);  Surgeon: [name], MD;  Location[facility];  Service: Urology;  Laterality: N/A;   Past Medical History:  Diagnosis Date  . Allergic rhinitis   . Anticoagulant long-term use    eliquis  . Cardiomyopathy due to systemic disease [facility])    followed by dr harding  . COPD with emphysema [facility])    pulmologist-  dr Halford Chessman  . Dyspnea    occasional per pt  . History of colon polyps    tubular adenoma 2013  . History of gout    09-05-2017 last flare-up  05/ 2019 3 wks ago, feet  . History of sepsis 02/18/2017   per d/c note probable uti, acute chf, acute renal failure, hypoxia  . Hyperlipidemia   . Hyperplasia of prostate with lower urinary tract symptoms (LUTS)   . Hypertension   . OSA on CPAP    per study  08-03-2004  Severe OSA  . Persistent atrial fibrillation    cardiologist --  dr Dorris Carnes--  post cardioversion 05-07-2014  . Prostate cancer [facility]) urologsit-  dr Alyson Ingles--- as of 05-21-2017 per pt last PSA 11 approx.   Dx  2014--  stage T1c, Gleason 3+3=6, PSA 6.67--  Active surveillance/  04/ 2019  Stage T1b, Gleason 3+4, PSA 12.8- plan external radiation therapy    . 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 Seasonal allergies   . Sigmoid diverticulosis   . Systolic and diastolic CHF, chronic [facility])    cardiologist-  [name]  . Tinea versicolor   . Type 2 diabetes mellitus ([place])   . Wears hearing aid in both ears    BP 124/79   Pulse 71   Temp 98.5 F (36.9 C)   Ht 5\' 11"  (1.803 m)   Wt 221 lb 9.6 oz (100.5 kg)   BMI 30.91 kg/m   Opioid Risk Score:   Fall Risk Score:  `1  Depression screen PHQ 2/9  Depression screen [facility] 2/9 12/24/2018 06/20/2017 12/05/2016 07/04/2015  Decreased Interest 0 0 0 0  Down, Depressed, Hopeless 0 0 0 0  PHQ - 2 Score 0 0 0 0  Some recent data might be hidden    Review of Systems  Constitutional: Negative.   HENT: Negative.   Eyes: Negative.   Respiratory: Negative.   Cardiovascular: Negative.   Gastrointestinal: Negative.   Endocrine: Negative.   Genitourinary: Positive for dysuria.  Musculoskeletal: Negative.   Skin: Negative.   Allergic/Immunologic: Negative.   Neurological: Negative.   Hematological: Negative.   Psychiatric/Behavioral: Negative.   All other systems reviewed and are negative.      Objective:   Physical Exam Vitals signs and nursing note reviewed.  Constitutional:      Appearance: Normal appearance.  Neck:     Musculoskeletal: Normal range of motion and neck supple.  Cardiovascular:     Rate and Rhythm: Normal rate and regular rhythm.     Pulses: Normal pulses.     Heart sounds: Normal heart sounds.  Pulmonary:     Effort: Pulmonary effort is  normal.  Breath sounds: Normal breath sounds.  Musculoskeletal:     Comments: Normal Muscle Bulk and Muscle Testing Reveals:  Upper Extremities: Full ROM and Muscle Strength 5/5  Lower Extremities: Full ROM and Muscle Strength 5/5 Arises from Table with Ease using walker for support Narrow Based Gait   Skin:    General: Skin is warm and dry.  Neurological:     Mental Status: He is alert and oriented to person, place, and time.  Psychiatric:        Mood and Affect: Mood normal.        Behavior: Behavior normal.           Assessment & Plan:  1. Encephalopathy: Resolving. Continue Outpatient therapy with [facility]. Has a scheduled appointment with Neurology.  2. PAF: Continue current medication regimen. Cardiology Following.  3. Seizures secondary ICH: Continue current medication regimen. Has a scheduled appointment with Neurology. 4. Chronic Diastolic CHF: Cardiology Following.  5. CKD Stage 3: PCP Following.  6. Essential Hypertension: Continue current medication regimen. PCP Following.  7. Hyperlipidemia: Continue current medication regimen. PCP following.   20 minutes of face to face patient care time was spent during this visit. All questions were encouraged and answered.  F/U with Dr. Letta Pate in 4- 6 weeks

## 2018-12-25 ENCOUNTER — Telehealth: Payer: Self-pay | Admitting: Family Medicine

## 2018-12-25 DIAGNOSIS — I5032 Chronic diastolic (congestive) heart failure: Secondary | ICD-10-CM | POA: Diagnosis not present

## 2018-12-25 DIAGNOSIS — E1122 Type 2 diabetes mellitus with diabetic chronic kidney disease: Secondary | ICD-10-CM | POA: Diagnosis not present

## 2018-12-25 DIAGNOSIS — I130 Hypertensive heart and chronic kidney disease with heart failure and stage 1 through stage 4 chronic kidney disease, or unspecified chronic kidney disease: Secondary | ICD-10-CM | POA: Diagnosis not present

## 2018-12-25 DIAGNOSIS — N183 Chronic kidney disease, stage 3 (moderate): Secondary | ICD-10-CM | POA: Diagnosis not present

## 2018-12-25 DIAGNOSIS — I69198 Other sequelae of nontraumatic intracerebral hemorrhage: Secondary | ICD-10-CM | POA: Diagnosis not present

## 2018-12-25 DIAGNOSIS — G40909 Epilepsy, unspecified, not intractable, without status epilepticus: Secondary | ICD-10-CM | POA: Diagnosis not present

## 2018-12-25 LAB — URINE CULTURE
MICRO NUMBER:: 883651
SPECIMEN QUALITY:: ADEQUATE

## 2018-12-25 NOTE — Telephone Encounter (Signed)
Patient's wife called in to check on status of script. Please advise.

## 2018-12-25 NOTE — Telephone Encounter (Signed)
Pt's home health nurse stated pt viewed his MyChart results for his urinalysis and stated he tested positive for a UTI. He is still having burning sensation when and wants to know if an antibiotic is going to be sent to his pharmacy. Please advise.  [id number] - [place], [place] (Phone) [phone] (Fax)

## 2018-12-26 ENCOUNTER — Telehealth: Payer: Self-pay | Admitting: *Deleted

## 2018-12-26 ENCOUNTER — Other Ambulatory Visit: Payer: Self-pay

## 2018-12-26 ENCOUNTER — Other Ambulatory Visit (HOSPITAL_COMMUNITY)
Admission: RE | Admit: 2018-12-26 | Discharge: 2018-12-26 | Disposition: A | Discharge status: Home / Self Care | Payer: Medicare Other | Source: Ambulatory Visit | Attending: Cardiology | Admitting: Cardiology

## 2018-12-26 DIAGNOSIS — N183 Chronic kidney disease, stage 3 (moderate): Secondary | ICD-10-CM | POA: Diagnosis not present

## 2018-12-26 DIAGNOSIS — Z01812 Encounter for preprocedural laboratory examination: Secondary | ICD-10-CM | POA: Diagnosis not present

## 2018-12-26 DIAGNOSIS — G40909 Epilepsy, unspecified, not intractable, without status epilepticus: Secondary | ICD-10-CM | POA: Diagnosis not present

## 2018-12-26 DIAGNOSIS — I69198 Other sequelae of nontraumatic intracerebral hemorrhage: Secondary | ICD-10-CM | POA: Diagnosis not present

## 2018-12-26 DIAGNOSIS — I130 Hypertensive heart and chronic kidney disease with heart failure and stage 1 through stage 4 chronic kidney disease, or unspecified chronic kidney disease: Secondary | ICD-10-CM | POA: Diagnosis not present

## 2018-12-26 DIAGNOSIS — Z20828 Contact with and (suspected) exposure to other viral communicable diseases: Secondary | ICD-10-CM | POA: Diagnosis not present

## 2018-12-26 DIAGNOSIS — I5032 Chronic diastolic (congestive) heart failure: Secondary | ICD-10-CM | POA: Diagnosis not present

## 2018-12-26 DIAGNOSIS — E1122 Type 2 diabetes mellitus with diabetic chronic kidney disease: Secondary | ICD-10-CM | POA: Diagnosis not present

## 2018-12-26 MED ORDER — CEPHALEXIN 500 MG PO CAPS: 500.0000 mg | ORAL_CAPSULE | Freq: Three times a day (TID) | ORAL | 0 refills | Status: DC

## 2018-12-26 NOTE — Telephone Encounter (Signed)
Please see POCT UA and Culture results. Please advise. Last OV 12/23/18

## 2018-12-26 NOTE — Telephone Encounter (Signed)
DOn OT with [facility] Lemmings called to request ok to move OT visit until next week due to family emergency. Approval given.

## 2018-12-26 NOTE — Telephone Encounter (Signed)
Called patient and let him know that [facility]. Patient stated that the swelling in his feet are going down also. Patient verbalized an understanding.

## 2018-12-26 NOTE — Telephone Encounter (Signed)
Urine culture was contaminated.   We can do Keflex 500 mg TID x 7 days due to still having symptoms

## 2018-12-27 LAB — NOVEL CORONAVIRUS, NAA (HOSP ORDER, SEND-OUT TO REF LAB; TAT 18-24 HRS): SARS-CoV-2, NAA: NOT DETECTED

## 2018-12-29 DIAGNOSIS — I69198 Other sequelae of nontraumatic intracerebral hemorrhage: Secondary | ICD-10-CM | POA: Diagnosis not present

## 2018-12-29 DIAGNOSIS — E1122 Type 2 diabetes mellitus with diabetic chronic kidney disease: Secondary | ICD-10-CM | POA: Diagnosis not present

## 2018-12-29 DIAGNOSIS — I130 Hypertensive heart and chronic kidney disease with heart failure and stage 1 through stage 4 chronic kidney disease, or unspecified chronic kidney disease: Secondary | ICD-10-CM | POA: Diagnosis not present

## 2018-12-29 DIAGNOSIS — N183 Chronic kidney disease, stage 3 (moderate): Secondary | ICD-10-CM | POA: Diagnosis not present

## 2018-12-29 DIAGNOSIS — G40909 Epilepsy, unspecified, not intractable, without status epilepticus: Secondary | ICD-10-CM | POA: Diagnosis not present

## 2018-12-29 DIAGNOSIS — I5032 Chronic diastolic (congestive) heart failure: Secondary | ICD-10-CM | POA: Diagnosis not present

## 2018-12-30 ENCOUNTER — Other Ambulatory Visit: Payer: Self-pay

## 2018-12-30 ENCOUNTER — Telehealth: Payer: Self-pay

## 2018-12-30 ENCOUNTER — Ambulatory Visit (HOSPITAL_COMMUNITY)
Admission: RE | Admit: 2018-12-30 | Discharge: 2018-12-30 | Disposition: A | Discharge status: Home / Self Care | Payer: Medicare Other | Source: Ambulatory Visit | Attending: Cardiology | Admitting: Cardiology

## 2018-12-30 DIAGNOSIS — I5032 Chronic diastolic (congestive) heart failure: Secondary | ICD-10-CM | POA: Diagnosis not present

## 2018-12-30 DIAGNOSIS — Z79899 Other long term (current) drug therapy: Secondary | ICD-10-CM | POA: Insufficient documentation

## 2018-12-30 DIAGNOSIS — G40909 Epilepsy, unspecified, not intractable, without status epilepticus: Secondary | ICD-10-CM | POA: Diagnosis not present

## 2018-12-30 DIAGNOSIS — I69198 Other sequelae of nontraumatic intracerebral hemorrhage: Secondary | ICD-10-CM | POA: Diagnosis not present

## 2018-12-30 DIAGNOSIS — I480 Paroxysmal atrial fibrillation: Secondary | ICD-10-CM

## 2018-12-30 DIAGNOSIS — I130 Hypertensive heart and chronic kidney disease with heart failure and stage 1 through stage 4 chronic kidney disease, or unspecified chronic kidney disease: Secondary | ICD-10-CM | POA: Diagnosis not present

## 2018-12-30 DIAGNOSIS — R0600 Dyspnea, unspecified: Secondary | ICD-10-CM

## 2018-12-30 DIAGNOSIS — R0609 Other forms of dyspnea: Secondary | ICD-10-CM | POA: Insufficient documentation

## 2018-12-30 DIAGNOSIS — E1122 Type 2 diabetes mellitus with diabetic chronic kidney disease: Secondary | ICD-10-CM | POA: Diagnosis not present

## 2018-12-30 DIAGNOSIS — N183 Chronic kidney disease, stage 3 (moderate): Secondary | ICD-10-CM | POA: Diagnosis not present

## 2018-12-30 LAB — PULMONARY FUNCTION TEST
DL/VA % pred: 58 %
DL/VA: 2.31 ml/min/mmHg/L
DLCO unc % pred: 50 %
DLCO unc: 13.13 ml/min/mmHg
FEF 25-75 Post: 3.16 L/sec
FEF 25-75 Pre: 2.52 L/sec
FEF2575-%Change-Post: 25 %
FEF2575-%Pred-Post: 130 %
FEF2575-%Pred-Pre: 104 %
FEV1-%Change-Post: 4 %
FEV1-%Pred-Post: 93 %
FEV1-%Pred-Pre: 89 %
FEV1-Post: 3.05 L
FEV1-Pre: 2.93 L
FEV1FVC-%Change-Post: 2 %
FEV1FVC-%Pred-Pre: 104 %
FEV6-%Change-Post: 2 %
FEV6-%Pred-Post: 92 %
FEV6-%Pred-Pre: 90 %
FEV6-Post: 3.9 L
FEV6-Pre: 3.82 L
FEV6FVC-%Change-Post: 0 %
FEV6FVC-%Pred-Post: 106 %
FEV6FVC-%Pred-Pre: 105 %
FVC-%Change-Post: 1 %
FVC-%Pred-Post: 86 %
FVC-%Pred-Pre: 85 %
FVC-Post: 3.9 L
FVC-Pre: 3.85 L
Post FEV1/FVC ratio: 78 %
Post FEV6/FVC ratio: 100 %
Pre FEV1/FVC ratio: 76 %
Pre FEV6/FVC Ratio: 99 %
RV % pred: 86 %
RV: 2.22 L
TLC % pred: 82 %
TLC: 5.98 L

## 2018-12-30 MED ORDER — ALBUTEROL SULFATE (2.5 MG/3ML) 0.083% IN NEBU
2.5000 mg | INHALATION_SOLUTION | Freq: Once | RESPIRATORY_TRACT | Status: AC
  Administered 2018-12-30: 2.5 mg via RESPIRATORY_TRACT

## 2018-12-30 NOTE — Telephone Encounter (Signed)
Start back Metformin 500 mg po bid and make sure he has follow up within 2-3 weeks.

## 2018-12-30 NOTE — Telephone Encounter (Signed)
Please see message. °

## 2018-12-30 NOTE — Telephone Encounter (Signed)
Copied from [place] [id number]. Topic: General - Other >> Dec 30, 2018  2:28 PM Rainey Pines A wrote: Ronald [name] wanted to inform Dr. Elease Hashimoto that pats blood sugar has been running high in the 300s for morning. Ronald [name] can be reached at [phone]

## 2018-12-31 ENCOUNTER — Other Ambulatory Visit: Payer: Self-pay

## 2018-12-31 DIAGNOSIS — Z9181 History of falling: Secondary | ICD-10-CM

## 2018-12-31 DIAGNOSIS — G40909 Epilepsy, unspecified, not intractable, without status epilepticus: Secondary | ICD-10-CM | POA: Diagnosis not present

## 2018-12-31 DIAGNOSIS — E1122 Type 2 diabetes mellitus with diabetic chronic kidney disease: Secondary | ICD-10-CM | POA: Diagnosis not present

## 2018-12-31 DIAGNOSIS — G9340 Encephalopathy, unspecified: Secondary | ICD-10-CM

## 2018-12-31 DIAGNOSIS — I5032 Chronic diastolic (congestive) heart failure: Secondary | ICD-10-CM | POA: Diagnosis not present

## 2018-12-31 DIAGNOSIS — Z79891 Long term (current) use of opiate analgesic: Secondary | ICD-10-CM

## 2018-12-31 DIAGNOSIS — Z9981 Dependence on supplemental oxygen: Secondary | ICD-10-CM

## 2018-12-31 DIAGNOSIS — E785 Hyperlipidemia, unspecified: Secondary | ICD-10-CM

## 2018-12-31 DIAGNOSIS — I1310 Hypertensive heart and chronic kidney disease without heart failure, with stage 1 through stage 4 chronic kidney disease, or unspecified chronic kidney disease: Secondary | ICD-10-CM | POA: Diagnosis not present

## 2018-12-31 DIAGNOSIS — Z7984 Long term (current) use of oral hypoglycemic drugs: Secondary | ICD-10-CM

## 2018-12-31 DIAGNOSIS — I130 Hypertensive heart and chronic kidney disease with heart failure and stage 1 through stage 4 chronic kidney disease, or unspecified chronic kidney disease: Secondary | ICD-10-CM | POA: Diagnosis not present

## 2018-12-31 DIAGNOSIS — I712 Thoracic aortic aneurysm, without rupture: Secondary | ICD-10-CM | POA: Diagnosis not present

## 2018-12-31 DIAGNOSIS — M5030 Other cervical disc degeneration, unspecified cervical region: Secondary | ICD-10-CM | POA: Diagnosis not present

## 2018-12-31 DIAGNOSIS — I69198 Other sequelae of nontraumatic intracerebral hemorrhage: Secondary | ICD-10-CM | POA: Diagnosis not present

## 2018-12-31 DIAGNOSIS — M1030 Gout due to renal impairment, unspecified site: Secondary | ICD-10-CM | POA: Diagnosis not present

## 2018-12-31 DIAGNOSIS — M8938 Hypertrophy of bone, other site: Secondary | ICD-10-CM

## 2018-12-31 DIAGNOSIS — I480 Paroxysmal atrial fibrillation: Secondary | ICD-10-CM | POA: Diagnosis not present

## 2018-12-31 DIAGNOSIS — Z7951 Long term (current) use of inhaled steroids: Secondary | ICD-10-CM

## 2018-12-31 DIAGNOSIS — N183 Chronic kidney disease, stage 3 (moderate): Secondary | ICD-10-CM | POA: Diagnosis not present

## 2018-12-31 DIAGNOSIS — I700 Atherosclerosis of aorta: Secondary | ICD-10-CM | POA: Diagnosis not present

## 2018-12-31 DIAGNOSIS — J449 Chronic obstructive pulmonary disease, unspecified: Secondary | ICD-10-CM | POA: Diagnosis not present

## 2018-12-31 MED ORDER — METFORMIN HCL 500 MG PO TABS: 500.0000 mg | ORAL_TABLET | Freq: Two times a day (BID) | ORAL | 0 refills | Status: DC

## 2018-12-31 NOTE — Telephone Encounter (Signed)
Called home health nurse and gave her message from Dr. Elease Hashimoto.  Called patient and he stated that his blood sugars have gone up with the antibiotic he thinks and he has an appointment on Tuesday, 01/06/19.  Patient also stated that he started wearing some compression stockings and his leg swelling has gone down but he is having stiffness. He is not sure if coming from the compression hose and he wants to know what to do about it?

## 2018-12-31 NOTE — Telephone Encounter (Signed)
Called patient and gave him the message from Dr. Elease Hashimoto. Patient verbalized an understanding.

## 2018-12-31 NOTE — Telephone Encounter (Signed)
Hard to say if stiffness from that.   Would keep appt for 29th and continue compression.  Make sure he starts back the Metformin.

## 2019-01-01 NOTE — Telephone Encounter (Signed)
Pt's wife states pt is experiencing significant pain in feet/ankles today and is unable to walk.  Pt is stiff and swollen and has had to take pain medication this AM.  Pt's wife also reports that pt is no longer taking colchicine and allopurinol and is concerned that pain may be related to gout.  Please call pt/pt's wife to discuss:   [phone]

## 2019-01-01 NOTE — Telephone Encounter (Signed)
It is hard to tell without seeing what it going on. I am full today but are there any other providers that can see him ?

## 2019-01-01 NOTE — Telephone Encounter (Signed)
No sir, there is no availability in clinic this afternoon. I can check other clinics, put him on [facility] tomorrow or recommend UC - which do you prefer?

## 2019-01-01 NOTE — Telephone Encounter (Signed)
Pt scheduled for OV with [facility] tomorrow.

## 2019-01-01 NOTE — Telephone Encounter (Signed)
If Dr. Elease Hashimoto has an opening tomorrow, that would be fine

## 2019-01-01 NOTE — Telephone Encounter (Signed)
Please advise as [facility] is not in office.   Thanks!

## 2019-01-02 ENCOUNTER — Encounter: Payer: Self-pay | Admitting: Family Medicine

## 2019-01-02 ENCOUNTER — Ambulatory Visit: Payer: Medicare Other | Admitting: Adult Health

## 2019-01-02 ENCOUNTER — Other Ambulatory Visit: Payer: Self-pay

## 2019-01-02 ENCOUNTER — Telehealth: Payer: Self-pay

## 2019-01-02 ENCOUNTER — Ambulatory Visit (INDEPENDENT_AMBULATORY_CARE_PROVIDER_SITE_OTHER): Payer: Medicare Other | Admitting: Family Medicine

## 2019-01-02 VITALS — BP 98/58 | HR 76 | Temp 99.1°F | Resp 76

## 2019-01-02 DIAGNOSIS — I10 Essential (primary) hypertension: Secondary | ICD-10-CM | POA: Diagnosis not present

## 2019-01-02 DIAGNOSIS — G4700 Insomnia, unspecified: Secondary | ICD-10-CM | POA: Diagnosis not present

## 2019-01-02 DIAGNOSIS — I5032 Chronic diastolic (congestive) heart failure: Secondary | ICD-10-CM

## 2019-01-02 DIAGNOSIS — E1165 Type 2 diabetes mellitus with hyperglycemia: Secondary | ICD-10-CM

## 2019-01-02 DIAGNOSIS — R600 Localized edema: Secondary | ICD-10-CM

## 2019-01-02 MED ORDER — QUETIAPINE FUMARATE 25 MG PO TABS: 25.0000 mg | ORAL_TABLET | Freq: Every day | ORAL | 2 refills | Status: DC

## 2019-01-02 NOTE — Patient Instructions (Signed)
HOLD the Amlodipine for now  For next 3 days increase the Lasix 40 mg to twice daily.    Watch sodium intake.

## 2019-01-02 NOTE — Telephone Encounter (Signed)
Called patient and left a detailed voice message to see if he wanted to see Dr. Elease Hashimoto today at 1:30pm instead of Cory at 11am but he can keep the 11am appointment if he needs to. Just wanted to give him the option since we had already talked about the feet swelling and the compression stockings.  OK for [facility] to discuss, advise, and/or schedule patient to switch time today if needed.  CRM Created.

## 2019-01-02 NOTE — Telephone Encounter (Signed)
Pt rescheduled for 1:30 with [place].

## 2019-01-02 NOTE — Progress Notes (Signed)
Subjective:     Patient ID: Ronald [name], male   DOB: 08-21-1945, 73 y.o.   MRN: [id number]  HPI Seen with increased foot and leg edema over the past several days.  Refer to recent note on the 15th for details.  He had recent intracerebral hemorrhage in the setting of Eliquis therapy.  He has had some setbacks since that time.  Their current concern is increasing edema.    No increased dyspnea.  Last creatinine improved to 1.25.  He is currently on amlodipine 10 mg daily which could be contributing.  We recently started back furosemide 40 mg daily.  Recent albumin 3.5.  By home weights his weight is up about 3 pounds over the past week.   No dyspnea at rest.  Was unable to get on our scales today to weigh.   Diabetes recently out of control.  We added back low-dose metformin and blood sugar fasting this morning 149 which is about 100 points down from what it had been.  Wt Readings from Last 3 Encounters:  12/24/18 221 lb 9.6 oz (100.5 kg)  12/23/18 222 lb 1.6 oz (100.7 kg)  12/13/18 218 lb 11.1 oz (99.2 kg)     Past Medical History:  Diagnosis Date  . Allergic rhinitis   . Anticoagulant long-term use    eliquis  . Cardiomyopathy due to systemic disease [facility])    followed by dr harding  . COPD with emphysema [facility])    pulmologist-  dr Halford Chessman  . Dyspnea    occasional per pt  . History of colon polyps    tubular adenoma 2013  . History of gout    09-05-2017 last flare-up  05/ 2019 3 wks ago, feet  . History of sepsis 02/18/2017   per d/c note probable uti, acute chf, acute renal failure, hypoxia  . Hyperlipidemia   . Hyperplasia of prostate with lower urinary tract symptoms (LUTS)   . Hypertension   . OSA on CPAP    per study 08-03-2004  Severe OSA  . Persistent atrial fibrillation    cardiologist --  dr Dorris Carnes--  post cardioversion 05-07-2014  . Prostate cancer [facility]) urologsit-  dr Alyson Ingles--- as of 05-21-2017 per pt last PSA 11 approx.   Dx  2014--  stage T1c, Gleason  3+3=6, PSA 6.67--  Active surveillance/  04/ 2019  Stage T1b, Gleason 3+4, PSA 12.8- plan external radiation therapy    . 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 ([place])    pulmologist-  dr Halford Chessman  . Seasonal allergies   . Sigmoid diverticulosis   . Systolic and diastolic CHF, chronic [facility])    cardiologist-  [name]  . Tinea versicolor   . Type 2 diabetes mellitus ([place])   . Wears hearing aid in both ears    Past Surgical History:  Procedure Laterality Date  . CARDIOVERSION N/A 05/07/2014   Procedure: CARDIOVERSION;  Surgeon: [name], MD;  Location[facility] ENDOSCOPY;  Service: Cardiovascular;  Laterality: N/A;  . CARDIOVERSION N/A 09/09/2014   Procedure: CARDIOVERSION;  Surgeon: Lelon Perla, MD;  Location: [facility] ENDOSCOPY;  Service: Cardiovascular;  Laterality: N/A;  successfully  . CATARACT EXTRACTION W/ INTRAOCULAR LENS  IMPLANT, BILATERAL  08 and 09/  2018  . COLONOSCOPY  last one 06-07-2011  . GOLD SEED IMPLANT N/A 09/09/2017   Procedure: GOLD SEED IMPLANT;  Surgeon: [name], MD;  Location: [facility];  Service: Urology;  Laterality: N/A;  . HYDROCELE EXCISION Bilateral  09/26/2015   Procedure: HYDROCELECTOMY ADULT;  Surgeon: [name], MD;  Location[facility];  Service: Urology;  Laterality: Bilateral;  . LAMINECTOMY AND MICRODISCECTOMY LUMBAR SPINE  12-23-2003   Left L5 -- S1  . LAPAROSCOPIC BILATERAL INGUINAL HERNIA REPAIR/  UMBILICAL HERNIA REPAIR WITH MESH/  ASPIRATION LEFT HYDROCELE  07-11-2012  . NM MYOVIEW LTD  05/18/2014   Low risk study. Normal perfusion: No ischemia or infarction. Mild LV dysfunction - 46% (does not correlate with echocardiographic EF of 50-55%)  . PROSTATE BIOPSY  05/15/12   Clinically both [place]  . SPACE OAR INSTILLATION N/A 09/09/2017   Procedure: SPACE OAR INSTILLATION;  Surgeon: [name], MD;  Location: [facility];  Service: Urology;  Laterality: N/A;  .  TEE WITHOUT CARDIOVERSION N/A 05/07/2014   Procedure: TRANSESOPHAGEAL ECHOCARDIOGRAM (TEE);  Surgeon: [name], MD;  Location: [facility] ENDOSCOPY;  Service: Cardiovascular;  Laterality: N/A;   mild atherosclerosis plaque of aorta,  mild AR, MR, and TR,  no cardiac source of emboli  . TONSILLECTOMY  as child  . TRANSTHORACIC ECHOCARDIOGRAM  11/'18; 1/'19   a) In setting of sepsis: EF of 40-45%.  Diffuse hypokinesis.  No RWMA.  Biatrial enlargement.;; b) ** f/u Jan 2019: Normal LVF 55-60%** , mildly dilated aortic root(38 mm) and ascending aorta (44 mm). Compared to prior echo, LVEF has improved.  . TRANSURETHRAL RESECTION OF PROSTATE N/A 05/30/2017   Procedure: TRANSURETHRAL RESECTION OF THE PROSTATE (TURP);  Surgeon: [name], MD;  Location: [facility];  Service: Urology;  Laterality: N/A;    reports that he has been smoking cigarettes. He has a 15.00 pack-year smoking history. He has never used smokeless tobacco. He reports current alcohol use of about 14.0 standard drinks of alcohol per week. He reports that he does not use drugs. family history includes Breast cancer in his mother; Ovarian cancer in his sister; Stroke in an other family member. No Known Allergies   Review of Systems  Constitutional: Positive for fatigue. Negative for chills and fever.  Respiratory: Negative for shortness of breath.   Cardiovascular: Positive for leg swelling. Negative for chest pain.  Neurological: Negative for syncope.  Psychiatric/Behavioral: Negative for confusion.       Objective:   Physical Exam Constitutional:      Appearance: Normal appearance.  Cardiovascular:     Rate and Rhythm: Normal rate and regular rhythm.  Pulmonary:     Effort: Pulmonary effort is normal.     Breath sounds: Normal breath sounds.  Musculoskeletal:     Right lower leg: Edema present.     Left lower leg: Edema present.     Comments: 2 plus pitting edema of feet and legs bilaterally up to the knee.  Neurological:      Mental Status: He is alert.        Assessment:     #1 increased peripheral edema.  Suspect multifactorial.  Probably partly related amlodipine 10 mg daily.  Hx of diastolic failure. No respiratory distress.   #2 history of hypertension.  Blood pressure today 98/58  #3 recent issues with insomnia patient requesting refills of Seroquel which has helped tremendously with his sleep at night    Plan:     -Hold amlodipine for now especially with lowish blood pressure and increased edema -Increase Lasix to 40 mg twice daily next 3 days.  They have office follow-up on Tuesday -Elevate legs frequently and watch sodium intake closely -Continue with daily weights  at home and bring in for review -Refill quetiapine -recheck BMP at follow up.  [name] MD [facility] at [facility]

## 2019-01-03 ENCOUNTER — Emergency Department (HOSPITAL_COMMUNITY): Payer: Medicare Other

## 2019-01-03 ENCOUNTER — Inpatient Hospital Stay (HOSPITAL_COMMUNITY)
Admission: EM | Admit: 2019-01-03 | Discharge: 2019-01-09 | DRG: 175 | Disposition: A | Discharge status: Home Health | Payer: Medicare Other | Attending: Internal Medicine | Admitting: Internal Medicine

## 2019-01-03 DIAGNOSIS — I2609 Other pulmonary embolism with acute cor pulmonale: Secondary | ICD-10-CM

## 2019-01-03 DIAGNOSIS — R4182 Altered mental status, unspecified: Secondary | ICD-10-CM | POA: Diagnosis not present

## 2019-01-03 DIAGNOSIS — Z8546 Personal history of malignant neoplasm of prostate: Secondary | ICD-10-CM

## 2019-01-03 DIAGNOSIS — N400 Enlarged prostate without lower urinary tract symptoms: Secondary | ICD-10-CM | POA: Diagnosis present

## 2019-01-03 DIAGNOSIS — G9341 Metabolic encephalopathy: Secondary | ICD-10-CM | POA: Diagnosis not present

## 2019-01-03 DIAGNOSIS — I130 Hypertensive heart and chronic kidney disease with heart failure and stage 1 through stage 4 chronic kidney disease, or unspecified chronic kidney disease: Secondary | ICD-10-CM | POA: Diagnosis present

## 2019-01-03 DIAGNOSIS — R0602 Shortness of breath: Secondary | ICD-10-CM | POA: Diagnosis not present

## 2019-01-03 DIAGNOSIS — Z8041 Family history of malignant neoplasm of ovary: Secondary | ICD-10-CM

## 2019-01-03 DIAGNOSIS — J9601 Acute respiratory failure with hypoxia: Secondary | ICD-10-CM | POA: Diagnosis not present

## 2019-01-03 DIAGNOSIS — Z20828 Contact with and (suspected) exposure to other viral communicable diseases: Secondary | ICD-10-CM | POA: Diagnosis not present

## 2019-01-03 DIAGNOSIS — I5042 Chronic combined systolic (congestive) and diastolic (congestive) heart failure: Secondary | ICD-10-CM | POA: Diagnosis not present

## 2019-01-03 DIAGNOSIS — M109 Gout, unspecified: Secondary | ICD-10-CM | POA: Diagnosis present

## 2019-01-03 DIAGNOSIS — R509 Fever, unspecified: Secondary | ICD-10-CM

## 2019-01-03 DIAGNOSIS — E876 Hypokalemia: Secondary | ICD-10-CM | POA: Diagnosis not present

## 2019-01-03 DIAGNOSIS — Z974 Presence of external hearing-aid: Secondary | ICD-10-CM

## 2019-01-03 DIAGNOSIS — K5730 Diverticulosis of large intestine without perforation or abscess without bleeding: Secondary | ICD-10-CM | POA: Diagnosis present

## 2019-01-03 DIAGNOSIS — H9193 Unspecified hearing loss, bilateral: Secondary | ICD-10-CM | POA: Diagnosis present

## 2019-01-03 DIAGNOSIS — R52 Pain, unspecified: Secondary | ICD-10-CM | POA: Diagnosis not present

## 2019-01-03 DIAGNOSIS — R569 Unspecified convulsions: Secondary | ICD-10-CM

## 2019-01-03 DIAGNOSIS — R600 Localized edema: Secondary | ICD-10-CM | POA: Diagnosis not present

## 2019-01-03 DIAGNOSIS — G4733 Obstructive sleep apnea (adult) (pediatric): Secondary | ICD-10-CM | POA: Diagnosis present

## 2019-01-03 DIAGNOSIS — R404 Transient alteration of awareness: Secondary | ICD-10-CM | POA: Diagnosis not present

## 2019-01-03 DIAGNOSIS — Z7984 Long term (current) use of oral hypoglycemic drugs: Secondary | ICD-10-CM

## 2019-01-03 DIAGNOSIS — I4819 Other persistent atrial fibrillation: Secondary | ICD-10-CM | POA: Diagnosis not present

## 2019-01-03 DIAGNOSIS — E785 Hyperlipidemia, unspecified: Secondary | ICD-10-CM | POA: Diagnosis present

## 2019-01-03 DIAGNOSIS — G40909 Epilepsy, unspecified, not intractable, without status epilepticus: Secondary | ICD-10-CM | POA: Diagnosis present

## 2019-01-03 DIAGNOSIS — Z803 Family history of malignant neoplasm of breast: Secondary | ICD-10-CM

## 2019-01-03 DIAGNOSIS — I69118 Other symptoms and signs involving cognitive functions following nontraumatic intracerebral hemorrhage: Secondary | ICD-10-CM

## 2019-01-03 DIAGNOSIS — I429 Cardiomyopathy, unspecified: Secondary | ICD-10-CM | POA: Diagnosis present

## 2019-01-03 DIAGNOSIS — R0902 Hypoxemia: Secondary | ICD-10-CM | POA: Diagnosis not present

## 2019-01-03 DIAGNOSIS — F17210 Nicotine dependence, cigarettes, uncomplicated: Secondary | ICD-10-CM | POA: Diagnosis present

## 2019-01-03 DIAGNOSIS — I517 Cardiomegaly: Secondary | ICD-10-CM | POA: Diagnosis not present

## 2019-01-03 DIAGNOSIS — I2699 Other pulmonary embolism without acute cor pulmonale: Secondary | ICD-10-CM | POA: Diagnosis not present

## 2019-01-03 DIAGNOSIS — I480 Paroxysmal atrial fibrillation: Secondary | ICD-10-CM | POA: Diagnosis present

## 2019-01-03 DIAGNOSIS — G9340 Encephalopathy, unspecified: Secondary | ICD-10-CM | POA: Diagnosis present

## 2019-01-03 DIAGNOSIS — E1165 Type 2 diabetes mellitus with hyperglycemia: Secondary | ICD-10-CM | POA: Diagnosis present

## 2019-01-03 DIAGNOSIS — Z7951 Long term (current) use of inhaled steroids: Secondary | ICD-10-CM

## 2019-01-03 DIAGNOSIS — E1122 Type 2 diabetes mellitus with diabetic chronic kidney disease: Secondary | ICD-10-CM | POA: Diagnosis present

## 2019-01-03 DIAGNOSIS — R410 Disorientation, unspecified: Secondary | ICD-10-CM | POA: Diagnosis not present

## 2019-01-03 DIAGNOSIS — I69198 Other sequelae of nontraumatic intracerebral hemorrhage: Secondary | ICD-10-CM

## 2019-01-03 DIAGNOSIS — R000 Tachycardia, unspecified: Secondary | ICD-10-CM | POA: Diagnosis not present

## 2019-01-03 DIAGNOSIS — J439 Emphysema, unspecified: Secondary | ICD-10-CM | POA: Diagnosis present

## 2019-01-03 DIAGNOSIS — N183 Chronic kidney disease, stage 3 unspecified: Secondary | ICD-10-CM | POA: Diagnosis present

## 2019-01-03 LAB — URINALYSIS, ROUTINE W REFLEX MICROSCOPIC
Bilirubin Urine: NEGATIVE
Glucose, UA: NEGATIVE mg/dL
Ketones, ur: NEGATIVE mg/dL
Nitrite: NEGATIVE
Protein, ur: NEGATIVE mg/dL
Specific Gravity, Urine: 1.009 (ref 1.005–1.030)
pH: 6 (ref 5.0–8.0)

## 2019-01-03 LAB — CBC WITH DIFFERENTIAL/PLATELET
Abs Immature Granulocytes: 0.08 10*3/uL — ABNORMAL HIGH (ref 0.00–0.07)
Basophils Absolute: 0 10*3/uL (ref 0.0–0.1)
Basophils Relative: 0 %
Eosinophils Absolute: 0.1 10*3/uL (ref 0.0–0.5)
Eosinophils Relative: 1 %
HCT: 34.4 % — ABNORMAL LOW (ref 39.0–52.0)
Hemoglobin: 11.3 g/dL — ABNORMAL LOW (ref 13.0–17.0)
Immature Granulocytes: 1 %
Lymphocytes Relative: 15 %
Lymphs Abs: 0.9 10*3/uL (ref 0.7–4.0)
MCH: 32.8 pg (ref 26.0–34.0)
MCHC: 32.8 g/dL (ref 30.0–36.0)
MCV: 99.7 fL (ref 80.0–100.0)
Monocytes Absolute: 0.8 10*3/uL (ref 0.1–1.0)
Monocytes Relative: 14 %
Neutro Abs: 4.1 10*3/uL (ref 1.7–7.7)
Neutrophils Relative %: 69 %
Platelets: 243 10*3/uL (ref 150–400)
RBC: 3.45 MIL/uL — ABNORMAL LOW (ref 4.22–5.81)
RDW: 13.4 % (ref 11.5–15.5)
WBC: 6 10*3/uL (ref 4.0–10.5)
nRBC: 0 % (ref 0.0–0.2)

## 2019-01-03 LAB — COMPREHENSIVE METABOLIC PANEL
ALT: 18 U/L (ref 0–44)
AST: 17 U/L (ref 15–41)
Albumin: 2.7 g/dL — ABNORMAL LOW (ref 3.5–5.0)
Alkaline Phosphatase: 75 U/L (ref 38–126)
Anion gap: 13 (ref 5–15)
BUN: 20 mg/dL (ref 8–23)
CO2: 27 mmol/L (ref 22–32)
Calcium: 8.2 mg/dL — ABNORMAL LOW (ref 8.9–10.3)
Chloride: 93 mmol/L — ABNORMAL LOW (ref 98–111)
Creatinine, Ser: 1.67 mg/dL — ABNORMAL HIGH (ref 0.61–1.24)
GFR calc Af Amer: 46 mL/min — ABNORMAL LOW (ref >60)
GFR calc non Af Amer: 40 mL/min — ABNORMAL LOW (ref >60)
Glucose, Bld: 169 mg/dL — ABNORMAL HIGH (ref 70–99)
Potassium: 3.3 mmol/L — ABNORMAL LOW (ref 3.5–5.1)
Sodium: 133 mmol/L — ABNORMAL LOW (ref 135–145)
Total Bilirubin: 0.9 mg/dL (ref 0.3–1.2)
Total Protein: 5.6 g/dL — ABNORMAL LOW (ref 6.5–8.1)

## 2019-01-03 LAB — POCT I-STAT EG7
Acid-Base Excess: 6 mmol/L — ABNORMAL HIGH (ref 0.0–2.0)
Bicarbonate: 31.4 mmol/L — ABNORMAL HIGH (ref 20.0–28.0)
Calcium, Ion: 1.06 mmol/L — ABNORMAL LOW (ref 1.15–1.40)
HCT: 35 % — ABNORMAL LOW (ref 39.0–52.0)
Hemoglobin: 11.9 g/dL — ABNORMAL LOW (ref 13.0–17.0)
O2 Saturation: 32 %
Potassium: 3 mmol/L — ABNORMAL LOW (ref 3.5–5.1)
Sodium: 135 mmol/L (ref 135–145)
TCO2: 33 mmol/L — ABNORMAL HIGH (ref 22–32)
pCO2, Ven: 47.7 mmHg (ref 44.0–60.0)
pH, Ven: 7.426 (ref 7.250–7.430)
pO2, Ven: 20 mmHg — CL (ref 32.0–45.0)

## 2019-01-03 LAB — LACTIC ACID, PLASMA
Lactic Acid, Venous: 1.1 mmol/L (ref 0.5–1.9)
Lactic Acid, Venous: 1.2 mmol/L (ref 0.5–1.9)

## 2019-01-03 LAB — CBG MONITORING, ED: Glucose-Capillary: 178 mg/dL — ABNORMAL HIGH (ref 70–99)

## 2019-01-03 LAB — VALPROIC ACID LEVEL: Valproic Acid Lvl: 63 ug/mL (ref 50.0–100.0)

## 2019-01-03 LAB — BRAIN NATRIURETIC PEPTIDE: B Natriuretic Peptide: 159.4 pg/mL — ABNORMAL HIGH (ref 0.0–100.0)

## 2019-01-03 LAB — APTT: aPTT: 29 seconds (ref 24–36)

## 2019-01-03 LAB — PROTIME-INR
INR: 1.1 (ref 0.8–1.2)
Prothrombin Time: 13.7 seconds (ref 11.4–15.2)

## 2019-01-03 LAB — D-DIMER, QUANTITATIVE: D-Dimer, Quant: 2.25 ug/mL-FEU — ABNORMAL HIGH (ref 0.00–0.50)

## 2019-01-03 MED ORDER — ACETAMINOPHEN 325 MG PO TABS
650.0000 mg | ORAL_TABLET | Freq: Once | ORAL | Status: AC
  Administered 2019-01-03: 22:00:00 650 mg via ORAL
  Filled 2019-01-03: qty 2

## 2019-01-03 MED ORDER — IOHEXOL 350 MG/ML SOLN
55.0000 mL | Freq: Once | INTRAVENOUS | Status: AC | PRN
  Administered 2019-01-03: 55 mL via INTRAVENOUS

## 2019-01-03 NOTE — ED Triage Notes (Signed)
Pt came in today via EMS with the c/o of AMS reported by his spouse that started mid-day today. Also complaint of sever Left foot pain. Abd. Distention present. 86%RA, 3Lo2 [place] applied Spo2 is now at 96%. 20G IV LAC. Hx. Of CGF.

## 2019-01-03 NOTE — ED Provider Notes (Signed)
[facility] EMERGENCY DEPARTMENT Provider Note   CSN: [id number] Arrival date & time: 01/03/19  2030     History   Chief Complaint Chief Complaint  Patient presents with   Altered Mental Status    HPI Ronald [name] is a 73 y.o. male.     Patient is a 73 year old male with a history of intracranial hemorrhage as a result of Eliquis in 7/20 of this year, then with a return to the hospital in August for altered mental status thought to be a result of the Keflex and he was changed to Depakote twice daily.  After hospitalization he did a stent in rehab and has been home since that time.  Wife states that last week he was having some confusion and not quite himself and was found to have a urinary tract infection and a completed 7 days of Keflex which he completed last week.  Wife reports that in the last 1 week patient has had worsening swelling in his bilateral feet and in the last 2 days has had some confusion, change in appetite and more fatigue.  Patient states he has had mild worsening shortness of breath and occasional cough but no sputum production.  While he was on the antibiotics he was having some diarrhea but that has stopped in the last for 5 days.  He has had no nausea or vomiting.  Patient did see Dr. Elease Hashimoto his PCP yesterday and at that time his amlodipine was discontinued as it was thought to possibly be the cause of his foot swelling and his Lasix was doubled which he took a double dose yesterday.  Patient denies any chest pain.  Wife denies any recent trauma or head injury.  Today because of patient's confusion and he seemed more altered his wife called EMS.  When EMS arrived patient was satting 86% on room air and he was placed on 3 L of oxygen with improvement in his oxygen saturation.  The history is provided by the patient and the spouse.  Altered Mental Status Presenting symptoms: confusion   Presenting symptoms comment:  2 days of decreased appetite,  some confusion and feeling warm.  1 week of worsening foot swelling. Progression:  Waxing and waning Chronicity:  Recurrent Context: recent change in medication and recent infection   Context: not alcohol use and not nursing home resident     Past Medical History:  Diagnosis Date   Allergic rhinitis    Anticoagulant long-term use    eliquis   Cardiomyopathy due to systemic disease ([place])    followed by dr harding   COPD with emphysema [facility])    pulmologist-  dr Halford Chessman   Dyspnea    occasional per pt   History of colon polyps    tubular adenoma 2013   History of gout    09-05-2017 last flare-up  05/ 2019 3 wks ago, feet   History of sepsis 02/18/2017   per d/c note probable uti, acute chf, acute renal failure, hypoxia   Hyperlipidemia    Hyperplasia of prostate with lower urinary tract symptoms (LUTS)    Hypertension    OSA on CPAP    per study 08-03-2004  Severe OSA   Persistent atrial fibrillation    cardiologist --  dr Dorris Carnes--  post cardioversion 05-07-2014   Prostate cancer [facility]) urologsit-  dr Alyson Ingles--- as of 05-21-2017 per pt last PSA 11 approx.   Dx  2014--  stage T1c, Gleason 3+3=6, PSA 6.67--  Active  surveillance/  04/ 2019  Stage T1b, Gleason 3+4, PSA 12.8- plan external radiation therapy     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 ([place])    pulmologist-  dr Halford Chessman   Seasonal allergies    Sigmoid diverticulosis    Systolic and diastolic CHF, chronic [facility])    cardiologist-  [name]   Tinea versicolor    Type 2 diabetes mellitus ([place])    Wears hearing aid in both ears     Patient Active Problem List   Diagnosis Date Noted   Pain    AMS (altered mental status) 11/23/2018   Encephalopathy 11/22/2018   CKD (chronic kidney disease), stage III ([place]) 11/22/2018   Thoracic ascending aortic aneurysm ([place]) 11/22/2018   Seizures ([place]), secondsry to [place] 10/23/2018   Coagulopathy ([place]), Eliquis 10/23/2018   Cerebral  edema ([place]) 10/23/2018   Hypertensive emergency 10/23/2018   Advanced age 60/16/2020   [place] (intracerebral hemorrhage) ([place]) w/ SAH while on Eliquis 10/21/2018   Hepatic steatosis 04/22/2018   Actinic keratoses 06/07/2017   BPH (benign prostatic hyperplasia) 05/30/2017   Cardiomyopathy due to systemic disease ([place]) 03/09/2017   Chronic diastolic CHF (congestive heart failure) ([place])    History of colonic polyps 07/03/2016   Chronic anticoagulation 07/03/2016   Hyperglycemia, drug-induced 04/05/2015   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 ([place]) 02/21/2015   On amiodarone therapy 12/08/2014   Edema of both legs 12/08/2014   Exertional dyspnea 08/18/2014   Hypokalemia    Tobacco abuse    Paroxysmal atrial fibrillation (H[facility]) 05/04/2014   Cigarette smoker two packs a day or less    Prostate cancer ([place]) 10/15/2013   Obesity (BMI 30-39.9) 36/62/9476   Umbilical hernia 54/65/0354   Right inguinal hernia 06/23/2012   Hydrocele 65/68/1275   Metabolic syndrome 17/00/1749   Type 2 diabetes mellitus with hyperglycemia ([place]) 03/20/2012   Elevated PSA 03/20/2012   Gout, unspecified 10/07/2009   TINEA VERSICOLOR 07/19/2009   PERS HX TOBACCO USE PRESENTING HAZARDS HEALTH 07/19/2009   Obstructive sleep apnea 07/02/2008   Hyperlipidemia with target LDL less than 70 07/01/2008   Essential hypertension 07/01/2008   ALLERGIC RHINITIS 07/01/2008    Past Surgical History:  Procedure Laterality Date   CARDIOVERSION N/A 05/07/2014   Procedure: CARDIOVERSION;  Surgeon: [name], MD;  Location: [facility];  Service: Cardiovascular;  Laterality: N/A;   CARDIOVERSION N/A 09/09/2014   Procedure: CARDIOVERSION;  Surgeon: Lelon Perla, MD;  Location: [facility] ENDOSCOPY;  Service: Cardiovascular;  Laterality: N/A;  successfully   CATARACT EXTRACTION W/ INTRAOCULAR LENS  IMPLANT, BILATERAL  08 and 09/  2018   COLONOSCOPY  last one 06-07-2011    GOLD SEED IMPLANT N/A 09/09/2017   Procedure: GOLD SEED IMPLANT;  Surgeon: [name], MD;  [facility];  Service: Urology;  Laterality: N/A;   HYDROCELE EXCISION Bilateral 09/26/2015   Procedure: HYDROCELECTOMY ADULT;  Surgeon: [name], MD;  Location: [facility];  Service: Urology;  Laterality: Bilateral;   LAMINECTOMY AND MICRODISCECTOMY LUMBAR SPINE  12-23-2003   Left L5 -- S1   LAPAROSCOPIC BILATERAL INGUINAL HERNIA REPAIR/  UMBILICAL HERNIA REPAIR WITH MESH/  ASPIRATION LEFT HYDROCELE  07-11-2012   NM MYOVIEW LTD  05/18/2014   Low risk study. Normal perfusion: No ischemia or infarction. Mild LV dysfunction - 46% (does not correlate with echocardiographic EF of 50-55%)   PROSTATE BIOPSY  05/15/12   Clinically both [place]   SPACE OAR INSTILLATION N/A 09/09/2017  Procedure: SPACE OAR INSTILLATION;  Surgeon: [name], MD;  Location: [facility];  Service: Urology;  Laterality: N/A;   TEE WITHOUT CARDIOVERSION N/A 05/07/2014   Procedure: TRANSESOPHAGEAL ECHOCARDIOGRAM (TEE);  Surgeon: [name], MD;  Location: [facility] ENDOSCOPY;  Service: Cardiovascular;  Laterality: N/A;   mild atherosclerosis plaque of aorta,  mild AR, MR, and TR,  no cardiac source of emboli   TONSILLECTOMY  as child   TRANSTHORACIC ECHOCARDIOGRAM  11/'18; 1/'19   a) In setting of sepsis: EF of 40-45%.  Diffuse hypokinesis.  No RWMA.  Biatrial enlargement.;; b) ** f/u Jan 2019: Normal LVF 55-60%** , mildly dilated aortic root(38 mm) and ascending aorta (44 mm). Compared to prior echo, LVEF has improved.   TRANSURETHRAL RESECTION OF PROSTATE N/A 05/30/2017   Procedure: TRANSURETHRAL RESECTION OF THE PROSTATE (TURP);  Surgeon: [name], MD;  Location: [facility];  Service: Urology;  Laterality: N/A;        Home Medications    Prior to Admission medications   Medication Sig Start Date End Date Taking? Authorizing Provider    acetaminophen (TYLENOL) 325 MG tablet Take 2 tablets (650 mg total) by mouth every 6 (six) hours as needed for mild pain (or Fever >/= 101). 12/12/18   Angiulli, Lavon Paganini, PA-C  albuterol (VENTOLIN HFA) 108 (90 Base) MCG/ACT inhaler Inhale 2 puffs into the lungs every 6 (six) hours as needed for wheezing or shortness of breath. 03/12/18   Chesley Mires, MD  allopurinol (ZYLOPRIM) 300 MG tablet Take 0.5 tablets (150 mg total) by mouth daily. 12/12/18   Angiulli, Lavon Paganini, PA-C  amiodarone (PACERONE) 200 MG tablet Take 1 tablet (200 mg total) by mouth daily. 12/12/18   Angiulli, Lavon Paganini, PA-C  atorvastatin (LIPITOR) 10 MG tablet TAKE 1 TABLET BY MOUTH DAILY EACH EVENING 12/12/18   Angiulli, Lavon Paganini, PA-C  azelastine (ASTELIN) 0.1 % nasal spray Place 1 spray into both nostrils 2 (two) times daily as needed for rhinitis or allergies. 12/12/18   Angiulli, Lavon Paganini, PA-C  Blood Glucose Monitoring Suppl (ACCU-CHEK AVIVA PLUS) w/Device KIT Use blood glucose machine as instructed on your strips 12/12/18   Angiulli, Lavon Paganini, PA-C  budesonide-formoterol (SYMBICORT) 160-4.5 MCG/ACT inhaler Inhale 2 puffs into the lungs 2 (two) times daily. 12/12/18   Angiulli, Lavon Paganini, PA-C  carvedilol (COREG) 12.5 MG tablet Take 1 tablet (12.5 mg total) by mouth 2 (two) times daily. 12/12/18 03/12/19  Angiulli, Lavon Paganini, PA-C  colchicine 0.6 MG tablet Take 1 tablet (0.6 mg total) by mouth 2 (two) times daily as needed (gout flare). 12/12/18   Angiulli, Lavon Paganini, PA-C  diclofenac sodium (VOLTAREN) 1 % GEL Apply 4 g topically 4 (four) times daily. 12/12/18   Angiulli, Lavon Paganini, PA-C  divalproex (DEPAKOTE) 500 MG DR tablet Take 1 tablet (500 mg total) by mouth every 12 (twelve) hours. 12/12/18   Angiulli, Lavon Paganini, PA-C  furosemide (LASIX) 40 MG tablet Take 40 mg by mouth daily.    [provider]  glimepiride (AMARYL) 2 MG tablet Take 1 tablet (2 mg total) by mouth daily with breakfast. 12/13/18   Angiulli, Lavon Paganini, PA-C  glucose blood  (ACCU-CHEK AVIVA PLUS) test strip Test 2 times daily dx e11.9 12/12/18   Angiulli, Lavon Paganini, PA-C  Lancets (ACCU-CHEK SOFT TOUCH) lancets Check blood sugars once per day. DX: E11.9 12/12/18   Angiulli, Lavon Paganini, PA-C  losartan (COZAAR) 25 MG tablet Take 1 tablet (25 mg total) by  mouth daily. 12/12/18   Angiulli, Lavon Paganini, PA-C  metFORMIN (GLUCOPHAGE) 500 MG tablet Take 1 tablet (500 mg total) by mouth 2 (two) times daily with a meal. 12/31/18   Burchette, Alinda Sierras, MD  montelukast (SINGULAIR) 10 MG tablet Take 1 tablet (10 mg total) by mouth every evening. 12/12/18   Angiulli, Lavon Paganini, PA-C  OZEMPIC, 0.25 OR 0.5 MG/DOSE, 2 MG/1.5ML SOPN 1 Dose. 12/15/18   [provider]  PRESCRIPTION MEDICATION CPAP- At bedtime    [provider]  QUEtiapine (SEROQUEL) 25 MG tablet Take 1 tablet (25 mg total) by mouth at bedtime. 01/02/19   Burchette, Alinda Sierras, MD    Family History Family History  Problem Relation Age of Onset   Breast cancer Mother    Stroke Other        Unknown    Ovarian cancer Sister    Colon cancer Neg Hx    Esophageal cancer Neg Hx    Rectal cancer Neg Hx    Stomach cancer Neg Hx     Social History Social History   Tobacco Use   Smoking status: Current Every Day Smoker    Packs/day: 0.25    Years: 60.00    Pack years: 15.00    Types: Cigarettes    Last attempt to quit: 04/11/2018    Years since quitting: 0.7   Smokeless tobacco: Never Used   Tobacco comment: 09-05-2017 tried quitting smoking 11/ 2018 but started back at 0.75ppd from 1ppd  Substance Use Topics   Alcohol use: Yes    Alcohol/week: 14.0 standard drinks    Types: 14 Standard drinks or equivalent per week    Comment: 2 drinks daily   Drug use: No     Allergies   Patient has no known allergies.   Review of Systems Review of Systems  Psychiatric/Behavioral: Positive for confusion.  All other systems reviewed and are negative.    Physical Exam Updated Vital Signs BP 129/74 (BP  Location: Right Arm)    Pulse 80    Temp 100.1 F (37.8 C) (Rectal)    Resp 20    Ht _0  (1.803 m)    Wt 98.9 kg    SpO2 96%    BMI 30.40 kg/m   Physical Exam Vitals signs and nursing note reviewed.  Constitutional:      General: He is not in acute distress.    Appearance: He is well-developed. He is obese.  HENT:     Head: Normocephalic and atraumatic.     Nose: Nose normal.     Mouth/Throat:     Mouth: Mucous membranes are dry.  Eyes:     Conjunctiva/sclera: Conjunctivae normal.     Pupils: Pupils are equal, round, and reactive to light.  Neck:     Musculoskeletal: Normal range of motion and neck supple.  Cardiovascular:     Rate and Rhythm: Normal rate and regular rhythm.     Pulses: Normal pulses.     Heart sounds: No murmur.  Pulmonary:     Effort: Pulmonary effort is normal. No respiratory distress.     Breath sounds: Examination of the right-lower field reveals rales. Rales present. No wheezing.  Abdominal:     General: There is distension.     Palpations: Abdomen is soft.     Tenderness: There is no abdominal tenderness. There is no guarding or rebound.  Musculoskeletal: Normal range of motion.        General: No tenderness.  Right lower leg: Edema present.     Left lower leg: Edema present.     Comments: Pitting edema bilateral feet to just above the ankle.  No calf pain or swelling  Skin:    General: Skin is warm and dry.     Findings: No erythema or rash.  Neurological:     Mental Status: He is alert and oriented to person, place, and time.     Comments: Moving all extremities without difficulty.  Sensation intact.  No facial drooping or slurred speech  Psychiatric:     Comments: Calm and cooperative and able answer questions at this time      ED Treatments / Results  Labs (all labs ordered are listed, but only abnormal results are displayed) Labs Reviewed  COMPREHENSIVE METABOLIC PANEL - Abnormal; Notable for the following components:      Result  Value   Sodium 133 (*)    Potassium 3.3 (*)    Chloride 93 (*)    Glucose, Bld 169 (*)    Creatinine, Ser 1.67 (*)    Calcium 8.2 (*)    Total Protein 5.6 (*)    Albumin 2.7 (*)    GFR calc non Af Amer 40 (*)    GFR calc Af Amer 46 (*)    All other components within normal limits  CBC WITH DIFFERENTIAL/PLATELET - Abnormal; Notable for the following components:   RBC 3.45 (*)    Hemoglobin 11.3 (*)    HCT 34.4 (*)    Abs Immature Granulocytes 0.08 (*)    All other components within normal limits  URINALYSIS, ROUTINE W REFLEX MICROSCOPIC - Abnormal; Notable for the following components:   Hgb urine dipstick SMALL (*)    Leukocytes,Ua SMALL (*)    Bacteria, UA RARE (*)    All other components within normal limits  BRAIN NATRIURETIC PEPTIDE - Abnormal; Notable for the following components:   B Natriuretic Peptide 159.4 (*)    All other components within normal limits  CBG MONITORING, ED - Abnormal; Notable for the following components:   Glucose-Capillary 178 (*)    All other components within normal limits  POCT I-STAT EG7 - Abnormal; Notable for the following components:   pO2, Ven 20.0 (*)    Bicarbonate 31.4 (*)    TCO2 33 (*)    Acid-Base Excess 6.0 (*)    Potassium 3.0 (*)    Calcium, Ion 1.06 (*)    HCT 35.0 (*)    Hemoglobin 11.9 (*)    All other components within normal limits  CULTURE, BLOOD (ROUTINE X 2)  CULTURE, BLOOD (ROUTINE X 2)  URINE CULTURE  SARS CORONAVIRUS 2 (TAT 6-24 HRS)  LACTIC ACID, PLASMA  APTT  PROTIME-INR  LACTIC ACID, PLASMA  VALPROIC ACID LEVEL  D-DIMER, QUANTITATIVE (NOT AT [facility])  I-STAT VENOUS BLOOD GAS, ED    EKG None  ED ECG REPORT   Date: 01/03/2019  Rate: 81  Rhythm: normal sinus rhythm  QRS Axis: normal  Intervals: normal  ST/T Wave abnormalities: nonspecific T wave changes  Conduction Disutrbances:none  Narrative Interpretation:   Old EKG Reviewed: unchanged  I have personally reviewed the EKG tracing and agree with  the computerized printout as noted.   Radiology Ct Head Wo Contrast  Result Date: 01/03/2019 CLINICAL DATA:  Altered level of consciousness. EXAM: CT HEAD WITHOUT CONTRAST TECHNIQUE: Contiguous axial images were obtained from the base of the skull through the vertex without intravenous contrast. COMPARISON:  Head CT and  brain MRI 11/22/2018 FINDINGS: Brain: Expected contraction of subacute parafalcine right frontal hematoma with developing encephalomalacia. No new or acute hemorrhage. No evidence of acute ischemia. No hydrocephalus. No midline shift. Stable generalized cerebral atrophy and chronic small vessel ischemia. Vascular: Atherosclerosis of skullbase vasculature without hyperdense vessel or abnormal calcification. Skull: No fracture or focal lesion. Sinuses/Orbits: Paranasal sinuses and mastoid air cells are clear. The visualized orbits are unremarkable. Bilateral lens extraction. Other: None. IMPRESSION: 1. No acute intracranial abnormality. 2. Expected contraction of subacute parafalcine right frontal hematoma over the last 6 weeks with encephalomalacia. 3. Stable atrophy and chronic small vessel ischemia. Electronically Signed   By: Keith Rake M.D.   On: 01/03/2019 21:45   Dg Chest Port 1 View  Result Date: 01/03/2019 CLINICAL DATA:  Altered mental status. EXAM: PORTABLE CHEST 1 VIEW COMPARISON:  Chest radiograph 07/26/2017. There is a lesion chest CT 03/17/2018. FINDINGS: Low lung volumes. Cardiomegaly. No obvious pulmonary edema. Left basilar evaluation is limited due to soft tissue attenuation. No evidence of acute airspace disease or pneumothorax. IMPRESSION: 1. Low lung volumes limit assessment, particularly at the left lung base. 2. Cardiomegaly. Electronically Signed   By: Keith Rake M.D.   On: 01/03/2019 21:13    Procedures Procedures (including critical care time)  Medications Ordered in ED Medications - No data to display   Initial Impression / Assessment and Plan  / ED Course  I have reviewed the triage vital signs and the nursing notes.  Pertinent labs & imaging results that were available during my care of the patient were reviewed by me and considered in my medical decision making (see chart for details).        Elderly male with multiple medical problems presenting today with altered mental status for the last few days that is waxing and waning and seems to be more related to encephalopathy low-grade temperature of 100.1 today and recent worsening in his lower extremity edema.  Patient has a history of CHF but during hospitalization in August due to AKI his Lasix was discontinued.  He had been back on a lower dose taking 40 mg daily but saw his doctor yesterday and it was doubled as well as his amlodipine discontinued as in the office he was hypotensive.  Wife states that last week he was treated for a UTI due to some confusion but that it seemed to gone away.  He took a one-week full course of Keflex.  Wife was not aware of his fever but he did have a temperature of 100.1 today upon arrival here and when EMS arrived was found to be hypoxic.  Concern for CHF exacerbation with the leg swelling, abdominal distention shortness of breath and hypoxia.  Patient has no wheezing concerning for COPD exacerbation however also the possibility for potential pneumonia.  Due to the altered mental status which is most likely related to encephalopathy we will also do a head CT to ensure no change in the intracranial hemorrhage.  Patient has been tolerating the Depakote well but will ensure levels are appropriate. Code sepsis was initiated at this time patient was not given a fluid bolus as it appears he is fluid overloaded.  BNP in addition to sepsis labs, CXR and Head CT and VBG pending.  Weights have not significantly changed in the last 2 weeks.  Also wife states for the last 3 to 4 days patient has taken hydrocodone every day 1 tablet because his feet have been hurting from  all the swelling.  This also may be leading to some of his confusion.  10:24 PM Patient's head CT today shows no acute changes and the normal progression from his bleed.  VBG without evidence of hypercarbia today, UA with no significant signs of UTI and a culture was sent.  Lactic acid within normal limits and CMP with mild AKI today with a creatinine of 1.67 from his baseline of 1.4 and a normal anion gap.  CBC without acute findings and coags within normal limits.  BNP is 159 and chest x-ray with cardiomegaly and some limitation due to atelectasis.  Given patient has had to stop Eliquis his new oxygen requirements, evidence of some fluid overload and low-grade temperature also possibility of PE.  D-dimer pending.  Dimer is elevated and CTA of chest pending.   Final Clinical Impressions(s) / ED Diagnoses   Final diagnoses:  Acute pulmonary embolism with acute cor pulmonale, unspecified pulmonary embolism type ([place])  Acute respiratory failure with hypoxia [facility])    ED Discharge Orders    None       Blanchie Dessert, MD 01/04/19 1413

## 2019-01-03 NOTE — ED Notes (Signed)
Pt family waiting outside to later be allowed in.

## 2019-01-03 NOTE — ED Notes (Addendum)
I Stat VBG results  pO2 of 20 reported to Dr. Maryan Rued

## 2019-01-04 ENCOUNTER — Encounter (HOSPITAL_COMMUNITY): Payer: Self-pay | Admitting: Family Medicine

## 2019-01-04 ENCOUNTER — Inpatient Hospital Stay (HOSPITAL_COMMUNITY): Payer: Medicare Other

## 2019-01-04 ENCOUNTER — Other Ambulatory Visit: Payer: Self-pay

## 2019-01-04 DIAGNOSIS — E1165 Type 2 diabetes mellitus with hyperglycemia: Secondary | ICD-10-CM

## 2019-01-04 DIAGNOSIS — R509 Fever, unspecified: Secondary | ICD-10-CM | POA: Diagnosis not present

## 2019-01-04 DIAGNOSIS — Z974 Presence of external hearing-aid: Secondary | ICD-10-CM | POA: Diagnosis not present

## 2019-01-04 DIAGNOSIS — Z803 Family history of malignant neoplasm of breast: Secondary | ICD-10-CM | POA: Diagnosis not present

## 2019-01-04 DIAGNOSIS — Z20828 Contact with and (suspected) exposure to other viral communicable diseases: Secondary | ICD-10-CM | POA: Diagnosis not present

## 2019-01-04 DIAGNOSIS — Z7951 Long term (current) use of inhaled steroids: Secondary | ICD-10-CM | POA: Diagnosis not present

## 2019-01-04 DIAGNOSIS — N183 Chronic kidney disease, stage 3 unspecified: Secondary | ICD-10-CM | POA: Diagnosis present

## 2019-01-04 DIAGNOSIS — G9341 Metabolic encephalopathy: Secondary | ICD-10-CM | POA: Diagnosis not present

## 2019-01-04 DIAGNOSIS — I4819 Other persistent atrial fibrillation: Secondary | ICD-10-CM | POA: Diagnosis not present

## 2019-01-04 DIAGNOSIS — M109 Gout, unspecified: Secondary | ICD-10-CM | POA: Diagnosis not present

## 2019-01-04 DIAGNOSIS — N400 Enlarged prostate without lower urinary tract symptoms: Secondary | ICD-10-CM | POA: Diagnosis not present

## 2019-01-04 DIAGNOSIS — R52 Pain, unspecified: Secondary | ICD-10-CM

## 2019-01-04 DIAGNOSIS — I629 Nontraumatic intracranial hemorrhage, unspecified: Secondary | ICD-10-CM | POA: Diagnosis not present

## 2019-01-04 DIAGNOSIS — J9601 Acute respiratory failure with hypoxia: Secondary | ICD-10-CM

## 2019-01-04 DIAGNOSIS — I5042 Chronic combined systolic (congestive) and diastolic (congestive) heart failure: Secondary | ICD-10-CM | POA: Diagnosis not present

## 2019-01-04 DIAGNOSIS — R4182 Altered mental status, unspecified: Secondary | ICD-10-CM | POA: Diagnosis not present

## 2019-01-04 DIAGNOSIS — I2699 Other pulmonary embolism without acute cor pulmonale: Secondary | ICD-10-CM

## 2019-01-04 DIAGNOSIS — I6782 Cerebral ischemia: Secondary | ICD-10-CM | POA: Diagnosis not present

## 2019-01-04 DIAGNOSIS — J439 Emphysema, unspecified: Secondary | ICD-10-CM | POA: Diagnosis not present

## 2019-01-04 DIAGNOSIS — Z8546 Personal history of malignant neoplasm of prostate: Secondary | ICD-10-CM | POA: Diagnosis not present

## 2019-01-04 DIAGNOSIS — I480 Paroxysmal atrial fibrillation: Secondary | ICD-10-CM

## 2019-01-04 DIAGNOSIS — I2609 Other pulmonary embolism with acute cor pulmonale: Secondary | ICD-10-CM | POA: Diagnosis not present

## 2019-01-04 DIAGNOSIS — M7989 Other specified soft tissue disorders: Secondary | ICD-10-CM

## 2019-01-04 DIAGNOSIS — F17210 Nicotine dependence, cigarettes, uncomplicated: Secondary | ICD-10-CM | POA: Diagnosis present

## 2019-01-04 DIAGNOSIS — I429 Cardiomyopathy, unspecified: Secondary | ICD-10-CM | POA: Diagnosis not present

## 2019-01-04 DIAGNOSIS — G4733 Obstructive sleep apnea (adult) (pediatric): Secondary | ICD-10-CM | POA: Diagnosis present

## 2019-01-04 DIAGNOSIS — Z7984 Long term (current) use of oral hypoglycemic drugs: Secondary | ICD-10-CM | POA: Diagnosis not present

## 2019-01-04 DIAGNOSIS — K5730 Diverticulosis of large intestine without perforation or abscess without bleeding: Secondary | ICD-10-CM | POA: Diagnosis present

## 2019-01-04 DIAGNOSIS — E785 Hyperlipidemia, unspecified: Secondary | ICD-10-CM | POA: Diagnosis not present

## 2019-01-04 DIAGNOSIS — G40909 Epilepsy, unspecified, not intractable, without status epilepticus: Secondary | ICD-10-CM | POA: Diagnosis present

## 2019-01-04 DIAGNOSIS — I130 Hypertensive heart and chronic kidney disease with heart failure and stage 1 through stage 4 chronic kidney disease, or unspecified chronic kidney disease: Secondary | ICD-10-CM | POA: Diagnosis not present

## 2019-01-04 DIAGNOSIS — Z8041 Family history of malignant neoplasm of ovary: Secondary | ICD-10-CM | POA: Diagnosis not present

## 2019-01-04 DIAGNOSIS — E1122 Type 2 diabetes mellitus with diabetic chronic kidney disease: Secondary | ICD-10-CM | POA: Diagnosis present

## 2019-01-04 LAB — ABO/RH: ABO/RH(D): A POS

## 2019-01-04 LAB — GLUCOSE, CAPILLARY
Glucose-Capillary: 130 mg/dL — ABNORMAL HIGH (ref 70–99)
Glucose-Capillary: 115 mg/dL — ABNORMAL HIGH (ref 70–99)
Glucose-Capillary: 124 mg/dL — ABNORMAL HIGH (ref 70–99)
Glucose-Capillary: 96 mg/dL (ref 70–99)
Glucose-Capillary: 131 mg/dL — ABNORMAL HIGH (ref 70–99)

## 2019-01-04 LAB — CBC
HCT: 32.7 % — ABNORMAL LOW (ref 39.0–52.0)
Hemoglobin: 10.8 g/dL — ABNORMAL LOW (ref 13.0–17.0)
MCH: 32.3 pg (ref 26.0–34.0)
MCHC: 33 g/dL (ref 30.0–36.0)
MCV: 97.9 fL (ref 80.0–100.0)
Platelets: 237 10*3/uL (ref 150–400)
RBC: 3.34 MIL/uL — ABNORMAL LOW (ref 4.22–5.81)
RDW: 13.4 % (ref 11.5–15.5)
WBC: 5.4 10*3/uL (ref 4.0–10.5)
nRBC: 0 % (ref 0.0–0.2)

## 2019-01-04 LAB — BASIC METABOLIC PANEL
Anion gap: 11 (ref 5–15)
BUN: 19 mg/dL (ref 8–23)
CO2: 27 mmol/L (ref 22–32)
Calcium: 7.9 mg/dL — ABNORMAL LOW (ref 8.9–10.3)
Chloride: 96 mmol/L — ABNORMAL LOW (ref 98–111)
Creatinine, Ser: 1.55 mg/dL — ABNORMAL HIGH (ref 0.61–1.24)
GFR calc Af Amer: 51 mL/min — ABNORMAL LOW (ref >60)
GFR calc non Af Amer: 44 mL/min — ABNORMAL LOW (ref >60)
Glucose, Bld: 131 mg/dL — ABNORMAL HIGH (ref 70–99)
Potassium: 3 mmol/L — ABNORMAL LOW (ref 3.5–5.1)
Sodium: 134 mmol/L — ABNORMAL LOW (ref 135–145)

## 2019-01-04 LAB — TROPONIN I (HIGH SENSITIVITY): Troponin I (High Sensitivity): 10 ng/L (ref <18)

## 2019-01-04 LAB — MAGNESIUM
Magnesium: 1.6 mg/dL — ABNORMAL LOW (ref 1.7–2.4)
Magnesium: 1.5 mg/dL — ABNORMAL LOW (ref 1.7–2.4)

## 2019-01-04 LAB — HEPARIN LEVEL (UNFRACTIONATED)
Heparin Unfractionated: <0.1 IU/mL — ABNORMAL LOW (ref 0.30–0.70)
Heparin Unfractionated: 0.12 IU/mL — ABNORMAL LOW (ref 0.30–0.70)

## 2019-01-04 LAB — TYPE AND SCREEN
ABO/RH(D): A POS
Antibody Screen: NEGATIVE

## 2019-01-04 LAB — ECHOCARDIOGRAM COMPLETE
Height: 71 in
Weight: 3488 oz

## 2019-01-04 LAB — SARS CORONAVIRUS 2 (TAT 6-24 HRS): SARS Coronavirus 2: NEGATIVE

## 2019-01-04 MED ORDER — QUETIAPINE FUMARATE 25 MG PO TABS: 25.0000 mg | ORAL_TABLET | Freq: Every day | ORAL | Status: DC

## 2019-01-04 MED ORDER — DIVALPROEX SODIUM 250 MG PO DR TAB
500.0000 mg | DELAYED_RELEASE_TABLET | Freq: Two times a day (BID) | ORAL | Status: DC
  Administered 2019-01-04 – 2019-01-09 (×11): 500 mg via ORAL
  Filled 2019-01-04 (×11): qty 2

## 2019-01-04 MED ORDER — ACETAMINOPHEN 650 MG RE SUPP: 650.0000 mg | Freq: Four times a day (QID) | RECTAL | Status: DC | PRN

## 2019-01-04 MED ORDER — SODIUM CHLORIDE 0.9% FLUSH
3.0000 mL | Freq: Two times a day (BID) | INTRAVENOUS | Status: DC
  Administered 2019-01-04 – 2019-01-08 (×6): 3 mL via INTRAVENOUS

## 2019-01-04 MED ORDER — SODIUM CHLORIDE 0.9% FLUSH
3.0000 mL | Freq: Two times a day (BID) | INTRAVENOUS | Status: DC
  Administered 2019-01-04 – 2019-01-08 (×4): 3 mL via INTRAVENOUS

## 2019-01-04 MED ORDER — ALBUTEROL SULFATE (2.5 MG/3ML) 0.083% IN NEBU: 3.0000 mL | INHALATION_SOLUTION | Freq: Four times a day (QID) | RESPIRATORY_TRACT | Status: DC | PRN

## 2019-01-04 MED ORDER — SODIUM CHLORIDE 0.9 % IV BOLUS: 500.0000 mL | Freq: Once | INTRAVENOUS | Status: DC

## 2019-01-04 MED ORDER — POTASSIUM CHLORIDE 10 MEQ/100ML IV SOLN
10.0000 meq | INTRAVENOUS | Status: AC
  Administered 2019-01-04 (×3): 10 meq via INTRAVENOUS
  Filled 2019-01-04 (×3): qty 100

## 2019-01-04 MED ORDER — MOMETASONE FURO-FORMOTEROL FUM 200-5 MCG/ACT IN AERO
2.0000 | INHALATION_SPRAY | Freq: Two times a day (BID) | RESPIRATORY_TRACT | Status: DC
  Administered 2019-01-04 – 2019-01-09 (×11): 2 via RESPIRATORY_TRACT
  Filled 2019-01-04: qty 8.8

## 2019-01-04 MED ORDER — POTASSIUM CHLORIDE CRYS ER 20 MEQ PO TBCR: 40.0000 meq | EXTENDED_RELEASE_TABLET | ORAL | Status: DC

## 2019-01-04 MED ORDER — HEPARIN (PORCINE) 25000 UT/250ML-% IV SOLN
2450.0000 [IU]/h | INTRAVENOUS | Status: DC
  Administered 2019-01-04: 1200 [IU]/h via INTRAVENOUS
  Administered 2019-01-04: 1700 [IU]/h via INTRAVENOUS
  Administered 2019-01-05: 1800 [IU]/h via INTRAVENOUS
  Administered 2019-01-05: 23:00:00 2000 [IU]/h via INTRAVENOUS
  Administered 2019-01-06: 13:00:00 2150 [IU]/h via INTRAVENOUS
  Administered 2019-01-07: 2350 [IU]/h via INTRAVENOUS
  Administered 2019-01-07: 2300 [IU]/h via INTRAVENOUS
  Administered 2019-01-08: 01:00:00 2450 [IU]/h via INTRAVENOUS
  Filled 2019-01-04 (×8): qty 250

## 2019-01-04 MED ORDER — INSULIN ASPART 100 UNIT/ML ~~LOC~~ SOLN
0.0000 [IU] | SUBCUTANEOUS | Status: DC
  Administered 2019-01-04 (×2): 1 [IU] via SUBCUTANEOUS
  Administered 2019-01-05: 2 [IU] via SUBCUTANEOUS
  Administered 2019-01-05: 05:00:00 1 [IU] via SUBCUTANEOUS
  Administered 2019-01-05: 2 [IU] via SUBCUTANEOUS
  Administered 2019-01-05: 3 [IU] via SUBCUTANEOUS
  Administered 2019-01-05: 08:00:00 1 [IU] via SUBCUTANEOUS

## 2019-01-04 MED ORDER — ONDANSETRON HCL 4 MG/2ML IJ SOLN: 4.0000 mg | Freq: Four times a day (QID) | INTRAMUSCULAR | Status: DC | PRN

## 2019-01-04 MED ORDER — AMIODARONE HCL 200 MG PO TABS
200.0000 mg | ORAL_TABLET | Freq: Every day | ORAL | Status: DC
  Administered 2019-01-04 – 2019-01-09 (×6): 200 mg via ORAL
  Filled 2019-01-04 (×6): qty 1

## 2019-01-04 MED ORDER — NICOTINE 21 MG/24HR TD PT24
21.0000 mg | MEDICATED_PATCH | Freq: Every day | TRANSDERMAL | Status: DC
  Administered 2019-01-04: 21 mg via TRANSDERMAL
  Filled 2019-01-04: qty 1

## 2019-01-04 MED ORDER — MORPHINE SULFATE (PF) 4 MG/ML IV SOLN: 3.0000 mg | INTRAVENOUS | Status: DC | PRN

## 2019-01-04 MED ORDER — POTASSIUM CHLORIDE CRYS ER 20 MEQ PO TBCR
40.0000 meq | EXTENDED_RELEASE_TABLET | Freq: Once | ORAL | Status: AC
  Administered 2019-01-04: 10:00:00 40 meq via ORAL
  Filled 2019-01-04: qty 2

## 2019-01-04 MED ORDER — SENNOSIDES-DOCUSATE SODIUM 8.6-50 MG PO TABS: 1.0000 | ORAL_TABLET | Freq: Every evening | ORAL | Status: DC | PRN

## 2019-01-04 MED ORDER — HYDROCODONE-ACETAMINOPHEN 5-325 MG PO TABS: 1.0000 | ORAL_TABLET | Freq: Four times a day (QID) | ORAL | Status: DC | PRN

## 2019-01-04 MED ORDER — ATORVASTATIN CALCIUM 10 MG PO TABS
10.0000 mg | ORAL_TABLET | Freq: Every evening | ORAL | Status: DC
  Administered 2019-01-04 – 2019-01-08 (×5): 10 mg via ORAL
  Filled 2019-01-04 (×5): qty 1

## 2019-01-04 MED ORDER — SODIUM CHLORIDE 0.9 % IV SOLN: 250.0000 mL | INTRAVENOUS | Status: DC | PRN

## 2019-01-04 MED ORDER — ACETAMINOPHEN 325 MG PO TABS: 650.0000 mg | ORAL_TABLET | Freq: Four times a day (QID) | ORAL | Status: DC | PRN

## 2019-01-04 MED ORDER — SODIUM CHLORIDE 0.9% FLUSH: 3.0000 mL | INTRAVENOUS | Status: DC | PRN

## 2019-01-04 MED ORDER — HEPARIN BOLUS VIA INFUSION
5500.0000 [IU] | Freq: Once | INTRAVENOUS | Status: DC
  Filled 2019-01-04: qty 5500

## 2019-01-04 MED ORDER — ONDANSETRON HCL 4 MG PO TABS: 4.0000 mg | ORAL_TABLET | Freq: Four times a day (QID) | ORAL | Status: DC | PRN

## 2019-01-04 MED ORDER — HEPARIN (PORCINE) 25000 UT/250ML-% IV SOLN
1400.0000 [IU]/h | INTRAVENOUS | Status: DC
  Filled 2019-01-04: qty 250

## 2019-01-04 MED ORDER — BISACODYL 5 MG PO TBEC: 5.0000 mg | DELAYED_RELEASE_TABLET | Freq: Every day | ORAL | Status: DC | PRN

## 2019-01-04 MED ORDER — MAGNESIUM SULFATE 2 GM/50ML IV SOLN
2.0000 g | Freq: Once | INTRAVENOUS | Status: AC
  Administered 2019-01-04: 10:00:00 2 g via INTRAVENOUS
  Filled 2019-01-04: qty 50

## 2019-01-04 NOTE — ED Provider Notes (Signed)
I assumed care of this patient from Dr. Maryan Rued.  Please see their note for further details of Hx, PE.  Briefly patient is a 73 y.o. male who presented with AMS and hypoxia. Low grade temp. No infectious source. CXR reassuring. Possible PE vs COVID vs CHF   Current plan is to follow up CTA.  CTA with PE. Heparing gtt. Admit for further management.  CRITICAL CARE Performed by: Grayce Sessions Walther [place] Total critical care time: 30 minutes Critical care time was exclusive of separately billable procedures and treating other patients. Critical care was necessary to treat or prevent imminent or life-threatening deterioration. Critical care was time spent personally by me on the following activities: development of treatment plan with patient and/or surrogate as well as nursing, discussions with consultants, evaluation of patient's response to treatment, examination of patient, obtaining history from patient or surrogate, ordering and performing treatments and interventions, ordering and review of laboratory studies, ordering and review of radiographic studies, pulse oximetry and re-evaluation of patient's condition.       Fatima Blank, MD 01/04/19 [phone]

## 2019-01-04 NOTE — Progress Notes (Signed)
Curbsided by Dr. Myna Hidalgo. Patient noted to have PE. Needs anticoagulation. Has a h/o right frontal ICH while on Eliquis. A couple of months ago. Dr. Myna Hidalgo needed opinion on anticoagulation. His h/o ICH on Eliquis definitely puts him at a higher risk of ICH but if he needs anticoagulation for PE, he should be on a heparin drip (no bolus-stroke protocol). He will always be at a higher risk for ICH because of prior bleed-if he is to be on anticoagulation.  Continue to monitor on heparin drip. Repeat CTH in 24-48h. If no evidence of bleed, can consider switching to oral anticoagulation.  -- Amie Portland, MD [facility] Pager: [phone] If 7pm to 7am, please call on call as listed on AMION.

## 2019-01-04 NOTE — Progress Notes (Signed)
ANTICOAGULATION CONSULT NOTE - Follow-Up Consult  Pharmacy Consult for Heparin  Indication: pulmonary embolus  No Known Allergies  Patient Measurements: Height: 5\' 11"  (180.3 cm) Weight: 218 lb (98.9 kg) IBW/kg (Calculated) : 75.3  Heparin dosing weight: 95.6 kg  Vital Signs: Temp: 99.8 F (37.7 C) (09/27 1408) Temp Source: Oral (09/27 1408) BP: 127/78 (09/27 1607) Pulse Rate: 84 (09/27 1607)  Labs: Recent Labs    01/03/19 2053 01/03/19 2204 01/04/19 0305 01/04/19 0457 01/04/19 0921 01/04/19 1835  HGB 11.3* 11.9*  --  10.8*  --   --   HCT 34.4* 35.0*  --  32.7*  --   --   PLT 243  --   --  237  --   --   APTT 29  --   --   --   --   --   LABPROT 13.7  --   --   --   --   --   INR 1.1  --   --   --   --   --   HEPARINUNFRC  --   --   --   --  <0.10* 0.12*  CREATININE 1.67*  --   --  1.55*  --   --   TROPONINIHS  --   --  10  --   --   --     Estimated Creatinine Clearance: 50.9 mL/min (A) (by C-G formula based on SCr of 1.55 mg/dL (H)).   Medical History: Past Medical History:  Diagnosis Date  . Allergic rhinitis   . Anticoagulant long-term use    eliquis  . Cardiomyopathy due to systemic disease [facility])    followed by dr harding  . COPD with emphysema [facility])    pulmologist-  dr Halford Chessman  . Dyspnea    occasional per pt  . History of colon polyps    tubular adenoma 2013  . History of gout    09-05-2017 last flare-up  05/ 2019 3 wks ago, feet  . History of sepsis 02/18/2017   per d/c note probable uti, acute chf, acute renal failure, hypoxia  . Hyperlipidemia   . Hyperplasia of prostate with lower urinary tract symptoms (LUTS)   . Hypertension   . OSA on CPAP    per study 08-03-2004  Severe OSA  . Persistent atrial fibrillation    cardiologist --  dr Dorris Carnes--  post cardioversion 05-07-2014  . Prostate cancer [facility]) urologsit-  dr Alyson Ingles--- as of 05-21-2017 per pt last PSA 11 approx.   Dx  2014--  stage T1c, Gleason 3+3=6, PSA 6.67--  Active  surveillance/  04/ 2019  Stage T1b, Gleason 3+4, PSA 12.8- plan external radiation therapy    . 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 ([place])    pulmologist-  dr Halford Chessman  . Seasonal allergies   . Sigmoid diverticulosis   . Systolic and diastolic CHF, chronic [place])    cardiologist-  [name]  . Tinea versicolor   . Type 2 diabetes mellitus ([place])   . Wears hearing aid in both ears     Assessment: 73 y/o M with new onset PE with RHS to begin heparin. No current AC PTA. Pt noted to have a recent [place] in July 2020 while on Apixaban, CT head today with no acute bleeding. [name] discussed with Neuro (Dr. Rory Percy), will proceed with heparin with no bolus and lower goal.  Heparin remains subtherapeutic at 0.12 but is trending up. Increased agitation reported per nursing but  no other S/Sx worsening neuro status. Heparin running at 1500 units/h, will increase without bolus.  Goal of Therapy:  Heparin level 0.3-0.5 units/mL, recent ICH in July 2020 Monitor platelets by anticoagulation protocol: Yes   Plan:  -Increase heparin to 1700 units/hr -Recheck heparin level in [place], PharmD, BCPS Clinical Pharmacist [phone] Please check AMION for all [facility] Pharmacy numbers 01/04/2019

## 2019-01-04 NOTE — H&P (Addendum)
History and Physical    Ronald [name] HKV:[id number] DOB: 09-10-1945 DOA: 01/03/2019  PCP: [name], MD   Patient coming from: Home   Chief Complaint: Confusion, bilateral lower leg pain Lt >> Rt   HPI: Ronald [name] is a 73 y.o. male with medical history significant for paroxysmal atrial fibrillation no longer on Eliquis after ICH in July 2020, COPD, OSA on CPAP, chronic diastolic CHF, type 2 diabetes mellitus, and seizure disorder after his ICH, now presenting to the emergency department with lower leg pain and confusion.  Patient is accompanied by his wife who assists with the history.  He has been doing well back at home, ambulating on his own with no appreciable residual weakness from his stroke, but has been unable to ambulate since 01/01/2019 due to bilateral lower leg pain that is much worse on the left.  He was also having bilateral leg swelling that improved after increasing his Lasix.  His cognitive function remained stable until the afternoon of 01/03/2019 when the patient seemed to be confused.  He has had some transient episodes of confusion since the [place] in July.  EMS was called, patient was found to be saturating in the mid 80s on room air, he was placed on supplemental oxygen, and brought into the ED.  He denies any chest pain or palpitations.  Denies any melena or hematochezia.  Denies headache or acute change in his vision or hearing, or focal numbness or weakness.  ED Course: Upon arrival to the ED, patient is found to be afebrile, saturating 89% on 3 L/min of supplemental oxygen, and with blood pressure 98/58.  EKG features a sinus rhythm and chest x-ray is notable for cardiomegaly with low lung volumes.  Noncontrast head CT is negative for acute findings but the expected contraction of right frontal hematoma is noted with encephalomalacia.  Chemistry panel features a sodium of 133, potassium 3.3, and creatinine 1.67, up from 1.25 earlier this month.  CBC features a mild  normocytic anemia.  CTA chest reveals acute PE involving the right lower lobe with evidence for heart strain.  COVID-19 testing is in process.  Review of Systems:  All other systems reviewed and apart from HPI, are negative.  Past Medical History:  Diagnosis Date  . Allergic rhinitis   . Anticoagulant long-term use    eliquis  . Cardiomyopathy due to systemic disease [facility])    followed by dr harding  . COPD with emphysema [facility])    pulmologist-  dr Halford Chessman  . Dyspnea    occasional per pt  . History of colon polyps    tubular adenoma 2013  . History of gout    09-05-2017 last flare-up  05/ 2019 3 wks ago, feet  . History of sepsis 02/18/2017   per d/c note probable uti, acute chf, acute renal failure, hypoxia  . Hyperlipidemia   . Hyperplasia of prostate with lower urinary tract symptoms (LUTS)   . Hypertension   . OSA on CPAP    per study 08-03-2004  Severe OSA  . Persistent atrial fibrillation    cardiologist --  dr Dorris Carnes--  post cardioversion 05-07-2014  . Prostate cancer [facility]) urologsit-  dr Alyson Ingles--- as of 05-21-2017 per pt last PSA 11 approx.   Dx  2014--  stage T1c, Gleason 3+3=6, PSA 6.67--  Active surveillance/  04/ 2019  Stage T1b, Gleason 3+4, PSA 12.8- plan external radiation therapy    . 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 Seasonal allergies   . Sigmoid diverticulosis   . Systolic and diastolic CHF, chronic [facility])    cardiologist-  [name]  . Tinea versicolor   . Type 2 diabetes mellitus ([place])   . Wears hearing aid in both ears     Past Surgical History:  Procedure Laterality Date  . CARDIOVERSION N/A 05/07/2014   Procedure: CARDIOVERSION;  Surgeon: [name], MD;  Location: [facility] ENDOSCOPY;  Service: Cardiovascular;  Laterality: N/A;  . CARDIOVERSION N/A 09/09/2014   Procedure: CARDIOVERSION;  Surgeon: Lelon Perla, MD;  Location: [facility] ENDOSCOPY;  Service: Cardiovascular;  Laterality: N/A;   successfully  . CATARACT EXTRACTION W/ INTRAOCULAR LENS  IMPLANT, BILATERAL  08 and 09/  2018  . COLONOSCOPY  last one 06-07-2011  . GOLD SEED IMPLANT N/A 09/09/2017   Procedure: GOLD SEED IMPLANT;  Surgeon: [name], MD;  Location: [facility];  Service: Urology;  Laterality: N/A;  . HYDROCELE EXCISION Bilateral 09/26/2015   Procedure: HYDROCELECTOMY ADULT;  Surgeon: [name], MD;  Location[facility];  Service: Urology;  Laterality: Bilateral;  . LAMINECTOMY AND MICRODISCECTOMY LUMBAR SPINE  12-23-2003   Left L5 -- S1  . LAPAROSCOPIC BILATERAL INGUINAL HERNIA REPAIR/  UMBILICAL HERNIA REPAIR WITH MESH/  ASPIRATION LEFT HYDROCELE  07-11-2012  . NM MYOVIEW LTD  05/18/2014   Low risk study. Normal perfusion: No ischemia or infarction. Mild LV dysfunction - 46% (does not correlate with echocardiographic EF of 50-55%)  . PROSTATE BIOPSY  05/15/12   Clinically both Lobes  . SPACE OAR INSTILLATION N/A 09/09/2017   Procedure: SPACE OAR INSTILLATION;  Surgeon: [name], MD;  Location: [facility];  Service: Urology;  Laterality: N/A;  . TEE WITHOUT CARDIOVERSION N/A 05/07/2014   Procedure: TRANSESOPHAGEAL ECHOCARDIOGRAM (TEE);  Surgeon: [name], MD;  Location: [facility] ENDOSCOPY;  Service: Cardiovascular;  Laterality: N/A;   mild atherosclerosis plaque of aorta,  mild AR, MR, and TR,  no cardiac source of emboli  . TONSILLECTOMY  as child  . TRANSTHORACIC ECHOCARDIOGRAM  11/'18; 1/'19   a) In setting of sepsis: EF of 40-45%.  Diffuse hypokinesis.  No RWMA.  Biatrial enlargement.;; b) ** f/u Jan 2019: Normal LVF 55-60%** , mildly dilated aortic root(38 mm) and ascending aorta (44 mm). Compared to prior echo, LVEF has improved.  . TRANSURETHRAL RESECTION OF PROSTATE N/A 05/30/2017   Procedure: TRANSURETHRAL RESECTION OF THE PROSTATE (TURP);  Surgeon: [name], MD;  Location: [facility];  Service: Urology;  Laterality: N/A;      reports that he has been smoking cigarettes. He has a 15.00 pack-year smoking history. He has never used smokeless tobacco. He reports current alcohol use of about 14.0 standard drinks of alcohol per week. He reports that he does not use drugs.  No Known Allergies  Family History  Problem Relation Age of Onset  . Breast cancer Mother   . Stroke Other        Unknown   . Ovarian cancer Sister   . Colon cancer Neg Hx   . Esophageal cancer Neg Hx   . Rectal cancer Neg Hx   . Stomach cancer Neg Hx      Prior to Admission medications   Medication Sig Start Date End Date Taking? Authorizing Provider  amiodarone (PACERONE) 200 MG tablet Take 1 tablet (200 mg total) by mouth daily. 12/12/18  Yes Angiulli, Lavon Paganini, PA-C  atorvastatin (LIPITOR)  10 MG tablet TAKE 1 TABLET BY MOUTH DAILY EACH EVENING Patient taking differently: Take 10 mg by mouth every evening. TAKE 1 TABLET BY MOUTH DAILY EACH EVENING 12/12/18  Yes Angiulli, Lavon Paganini, PA-C  carvedilol (COREG) 12.5 MG tablet Take 1 tablet (12.5 mg total) by mouth 2 (two) times daily. 12/12/18 03/12/19 Yes Angiulli, Lavon Paganini, PA-C  colchicine 0.6 MG tablet Take 1 tablet (0.6 mg total) by mouth 2 (two) times daily as needed (gout flare). 12/12/18  Yes Angiulli, Lavon Paganini, PA-C  diclofenac sodium (VOLTAREN) 1 % GEL Apply 4 g topically 4 (four) times daily. Patient taking differently: Apply 4 g topically 4 (four) times daily as needed (foot pain).  12/12/18  Yes Angiulli, Lavon Paganini, PA-C  divalproex (DEPAKOTE) 500 MG DR tablet Take 1 tablet (500 mg total) by mouth every 12 (twelve) hours. 12/12/18  Yes Angiulli, Lavon Paganini, PA-C  furosemide (LASIX) 40 MG tablet Take 40 mg by mouth 2 (two) times daily with breakfast and lunch.    Yes [provider]  glimepiride (AMARYL) 2 MG tablet Take 1 tablet (2 mg total) by mouth daily with breakfast. 12/13/18  Yes Angiulli, Lavon Paganini, PA-C  HYDROcodone-acetaminophen (NORCO/VICODIN) 5-325 MG tablet Take 1 tablet by mouth  every 6 (six) hours as needed for moderate pain.   Yes [provider]  losartan (COZAAR) 25 MG tablet Take 1 tablet (25 mg total) by mouth daily. 12/12/18  Yes Angiulli, Lavon Paganini, PA-C  metFORMIN (GLUCOPHAGE) 500 MG tablet Take 1 tablet (500 mg total) by mouth 2 (two) times daily with a meal. 12/31/18  Yes Burchette, Alinda Sierras, MD  montelukast (SINGULAIR) 10 MG tablet Take 1 tablet (10 mg total) by mouth every evening. 12/12/18  Yes Angiulli, Lavon Paganini, PA-C  PRESCRIPTION MEDICATION Inhale into the lungs at bedtime. CPAP   Yes [provider]  QUEtiapine (SEROQUEL) 25 MG tablet Take 1 tablet (25 mg total) by mouth at bedtime. 01/02/19  Yes Burchette, Alinda Sierras, MD  acetaminophen (TYLENOL) 325 MG tablet Take 2 tablets (650 mg total) by mouth every 6 (six) hours as needed for mild pain (or Fever >/= 101). Patient not taking: Reported on 01/03/2019 12/12/18   Angiulli, Lavon Paganini, PA-C  albuterol (VENTOLIN HFA) 108 (90 Base) MCG/ACT inhaler Inhale 2 puffs into the lungs every 6 (six) hours as needed for wheezing or shortness of breath. Patient not taking: Reported on 01/03/2019 03/12/18   Chesley Mires, MD  allopurinol (ZYLOPRIM) 300 MG tablet Take 0.5 tablets (150 mg total) by mouth daily. Patient not taking: Reported on 01/03/2019 12/12/18   Angiulli, Lavon Paganini, PA-C  azelastine (ASTELIN) 0.1 % nasal spray Place 1 spray into both nostrils 2 (two) times daily as needed for rhinitis or allergies. Patient not taking: Reported on 01/03/2019 12/12/18   Angiulli, Lavon Paganini, PA-C  Blood Glucose Monitoring Suppl (ACCU-CHEK AVIVA PLUS) w/Device KIT Use blood glucose machine as instructed on your strips 12/12/18   Angiulli, Lavon Paganini, PA-C  budesonide-formoterol (SYMBICORT) 160-4.5 MCG/ACT inhaler Inhale 2 puffs into the lungs 2 (two) times daily. Patient not taking: Reported on 01/03/2019 12/12/18   Angiulli, Lavon Paganini, PA-C  glucose blood (ACCU-CHEK AVIVA PLUS) test strip Test 2 times daily dx e11.9 12/12/18   Angiulli,  Lavon Paganini, PA-C  Lancets (ACCU-CHEK SOFT TOUCH) lancets Check blood sugars once per day. DX: E11.9 12/12/18   Cathlyn Parsons, PA-C    Physical Exam: Vitals:   01/03/19 2145 01/03/19 2200 01/03/19 2345 01/04/19 0000  BP: (!) 181/154 114/72 101/68   Pulse: 80 76 78 77  Resp: (!) 41 (!) '22 20 19  ' Temp:      TempSrc:      SpO2: (!) 89% 91% 95% 95%  Weight:      Height:        Constitutional: NAD, calm, no pallor, no diaphoresis Eyes: PERTLA, lids and conjunctivae normal ENMT: Mucous membranes are moist. Posterior pharynx clear of any exudate or lesions.   Neck: normal, supple, no masses, no thyromegaly Respiratory:  no wheezing, no crackles. Normal respiratory effort. No accessory muscle use.  Cardiovascular: S1 & S2 heard, regular rate and rhythm. Pretibial pitting edema bilaterally. Abdomen: no tenderness, soft. Bowel sounds active.   Musculoskeletal: no clubbing / cyanosis. No joint deformity upper and lower extremities.   Skin: no significant rashes, lesions, ulcers. Warm, dry, well-perfused. Neurologic: No facial asymmetry. Sensation intact. Strength 5/5 in all 4 limbs.  Psychiatric: Alert and oriented to person, place, and situation. Calm, cooperative.    Labs on Admission: I have personally reviewed following labs and imaging studies  CBC: Recent Labs  Lab 01/03/19 2053 01/03/19 2204  WBC 6.0  --   NEUTROABS 4.1  --   HGB 11.3* 11.9*  HCT 34.4* 35.0*  MCV 99.7  --   PLT 243  --    Basic Metabolic Panel: Recent Labs  Lab 01/03/19 2053 01/03/19 2204  NA 133* 135  K 3.3* 3.0*  CL 93*  --   CO2 27  --   GLUCOSE 169*  --   BUN 20  --   CREATININE 1.67*  --   CALCIUM 8.2*  --    GFR: Estimated Creatinine Clearance: 47.2 mL/min (A) (by C-G formula based on SCr of 1.67 mg/dL (H)). Liver Function Tests: Recent Labs  Lab 01/03/19 2053  AST 17  ALT 18  ALKPHOS 75  BILITOT 0.9  PROT 5.6*  ALBUMIN 2.7*   No results for input(s): LIPASE, AMYLASE in the  last 168 hours. No results for input(s): AMMONIA in the last 168 hours. Coagulation Profile: Recent Labs  Lab 01/03/19 2053  INR 1.1   Cardiac Enzymes: No results for input(s): CKTOTAL, CKMB, CKMBINDEX, TROPONINI in the last 168 hours. BNP (last 3 results) No results for input(s): PROBNP in the last 8760 hours. HbA1C: No results for input(s): HGBA1C in the last 72 hours. CBG: Recent Labs  Lab 01/03/19 2038  GLUCAP 178*   Lipid Profile: No results for input(s): CHOL, HDL, LDLCALC, TRIG, CHOLHDL, LDLDIRECT in the last 72 hours. Thyroid Function Tests: No results for input(s): TSH, T4TOTAL, FREET4, T3FREE, THYROIDAB in the last 72 hours. Anemia Panel: No results for input(s): VITAMINB12, FOLATE, FERRITIN, TIBC, IRON, RETICCTPCT in the last 72 hours. Urine analysis:    Component Value Date/Time   COLORURINE YELLOW 01/03/2019 2115   APPEARANCEUR CLEAR 01/03/2019 2115   LABSPEC 1.009 01/03/2019 2115   PHURINE 6.0 01/03/2019 2115   GLUCOSEU NEGATIVE 01/03/2019 2115   HGBUR SMALL (A) 01/03/2019 2115   BILIRUBINUR NEGATIVE 01/03/2019 2115   BILIRUBINUR n 12/23/2018 1416   KETONESUR NEGATIVE 01/03/2019 2115   PROTEINUR NEGATIVE 01/03/2019 2115   UROBILINOGEN 0.2 12/23/2018 1416   UROBILINOGEN 1.0 05/04/2014 1123   NITRITE NEGATIVE 01/03/2019 2115   LEUKOCYTESUR SMALL (A) 01/03/2019 2115   Sepsis Labs: '@LABRCNTIP' (procalcitonin:4,lacticidven:4) ) Recent Results (from the past 240 hour(s))  Novel Coronavirus, NAA (Hosp order, Send-out to Ref Lab; TAT 18-24 hrs     Status: None  Collection Time: 12/26/18 11:28 AM   Specimen: Nasopharyngeal Swab; Respiratory  Result Value Ref Range Status   SARS-CoV-2, NAA NOT DETECTED NOT DETECTED Final    Comment: (NOTE) This nucleic acid amplification test was developed and its performance characteristics determined by [employer]. Nucleic acid amplification tests include PCR and TMA. This test has not been FDA cleared or  approved. This test has been authorized by FDA under an Emergency Use Authorization (EUA). This test is only authorized for the duration of time the declaration that circumstances exist justifying the authorization of the emergency use of in vitro diagnostic tests for detection of SARS-CoV-2 virus and/or diagnosis of COVID-19 infection under section 564(b)(1) of the Act, 21 U.S.C. 212YQM-2(N) (1), unless the authorization is terminated or revoked sooner. When diagnostic testing is negative, the possibility of a false negative result should be considered in the context of a patient's recent exposures and the presence of clinical signs and symptoms consistent with COVID-19. An individual without symptoms of COVID- 19 and who is not shedding SARS-CoV-2 vi rus would expect to have a negative (not detected) result in this assay. Performed At: [facility] [address] [place], [place] [id number] Rush Farmer MD BV:[id number]    [place]  Final    Comment: Performed at [facility], [place] [address] [place], [place] [address]     Radiological Exams on Admission: Ct Head Wo Contrast  Result Date: 01/03/2019 CLINICAL DATA:  Altered level of consciousness. EXAM: CT HEAD WITHOUT CONTRAST TECHNIQUE: Contiguous axial images were obtained from the base of the skull through the vertex without intravenous contrast. COMPARISON:  Head CT and brain MRI 11/22/2018 FINDINGS: Brain: Expected contraction of subacute parafalcine right frontal hematoma with developing encephalomalacia. No new or acute hemorrhage. No evidence of acute ischemia. No hydrocephalus. No midline shift. Stable generalized cerebral atrophy and chronic small vessel ischemia. Vascular: Atherosclerosis of skullbase vasculature without hyperdense vessel or abnormal calcification. Skull: No fracture or focal lesion. Sinuses/Orbits: Paranasal sinuses and mastoid air cells are clear. The visualized orbits  are unremarkable. Bilateral lens extraction. Other: None. IMPRESSION: 1. No acute intracranial abnormality. 2. Expected contraction of subacute parafalcine right frontal hematoma over the last 6 weeks with encephalomalacia. 3. Stable atrophy and chronic small vessel ischemia. Electronically Signed   By: Keith Rake M.D.   On: 01/03/2019 21:45   Ct Angio Chest Pe W And/or Wo Contrast  Addendum Date: 01/04/2019   ADDENDUM REPORT: 01/04/2019 00:50 ADDENDUM: Dilation of the ascending thoracic aorta to 4.1 cm. Recommend annual imaging followup by CTA or MRA. This recommendation follows 2010 ACCF/AHA/AATS/ACR/ASA/SCA/SCAI/SIR/STS/SVM Guidelines for the Diagnosis and Management of Patients with Thoracic Aortic Disease. Circulation. 2010; 121: J179-X505. Aortic aneurysm NOS (ICD10-I71.9) Electronically Signed   By: Lovena Le M.D.   On: 01/04/2019 00:50   Result Date: 01/04/2019 CLINICAL DATA:  Altered mental status, starting mid day, abdominal pain and distension, PE suspected, positive D-dimer EXAM: CT ANGIOGRAPHY CHEST WITH CONTRAST TECHNIQUE: Multidetector CT imaging of the chest was performed using the standard protocol during bolus administration of intravenous contrast. Multiplanar CT image reconstructions and MIPs were obtained to evaluate the vascular anatomy. CONTRAST:  12m OMNIPAQUE IOHEXOL 350 MG/ML SOLN COMPARISON:  CT chest 01/31/2015 FINDINGS: Cardiovascular: Satisfactory opacification of pulmonary arteries to the segmental level. Filling defects are present in the pulmonary arteries of the superior segment and posterior basal segment of the right lower lobe. No other visible pulmonary artery filling defects are evident. There is central pulmonary  arterial enlargement, increased from comparison study. There is borderline elevation of the RV/LV ratio (0.95) with flattening of the intraventricular septum. Borderline cardiomegaly. No pericardial effusion. Ascending aorta is dilated to 4.1 cm. There  is atherosclerotic calcification of the aortic arch and proximal great vessels. Suboptimal contrast opacification of the aorta limits luminal evaluation. Mediastinum/Nodes: No enlarged mediastinal or axillary lymph nodes. Thyroid gland, trachea, and esophagus demonstrate no significant findings. Lungs/Pleura: Lung volumes are diminished with extensive areas of atelectasis throughout both lungs. More subpleural reticulation is noted in the upper lobes with some peribronchial thickening and bronchiectatic changes throughout both lungs. Subpleural cyst noted in the right lower lung. A previously seen 5 mm nodule in the left lower lobe is poorly visualized on today's exam due to dependent atelectasis in the lung base. Lobular 12 mm nodule in the lingula associated with a bandlike area of scarring may reflect rounded atelectasis, unchanged from prior. Upper Abdomen: No acute abnormalities present in the visualized portions of the upper abdomen. Abdominal aortic atherosclerosis is evident. Musculoskeletal: No acute osseous abnormality or suspicious osseous lesion. Multilevel degenerative changes are present in the imaged portions of the spine. Review of the MIP images confirms the above findings. IMPRESSION: 1. Positive for acute PE with CT evidence of right heart strain (RV/LV Ratio = 0.95, flattening of the intraventricular septum, central pulmonary artery enlargement) consistent with at least submassive (intermediate risk) PE. The presence of right heart strain has been associated with an increased risk of morbidity and mortality. Please activate Code PE by paging [phone]. 2. Extensive areas of atelectasis likely accentuated by imaging during exhalation. 3. Suspect a region of rounded atelectasis or scarring within the lingula, stable from comparison exam in 2019. 4.  Aortic Atherosclerosis (ICD10-I70.0).  Coronary atherosclerosis. Critical Value/emergent results were called by telephone at the time of  interpretation on 01/04/2019 at 12:05 am to providerCardama MD, who verbally acknowledged these results. Electronically Signed: By: Lovena Le M.D. On: 01/04/2019 00:08   Dg Chest Port 1 View  Result Date: 01/03/2019 CLINICAL DATA:  Altered mental status. EXAM: PORTABLE CHEST 1 VIEW COMPARISON:  Chest radiograph 07/26/2017. There is a lesion chest CT 03/17/2018. FINDINGS: Low lung volumes. Cardiomegaly. No obvious pulmonary edema. Left basilar evaluation is limited due to soft tissue attenuation. No evidence of acute airspace disease or pneumothorax. IMPRESSION: 1. Low lung volumes limit assessment, particularly at the left lung base. 2. Cardiomegaly. Electronically Signed   By: Keith Rake M.D.   On: 01/03/2019 21:13    EKG: Independently reviewed. Sinus rhythm.   Assessment/Plan   1. Acute pulmonary embolism; acute hypoxic respiratory failure  - Presents with 3 days of bilateral lower leg pain, mainly on left, a few hours of confusion, and was found to be hypoxic with low-normal BP  - CTA reveals acute PE in RLL with evidence for right heart strain  - He is not having chest pain and BP has remained stable  - BNP is elevated, but lower than prior - Discussed treatment with neurology in light of [place] on 10/21/18; it was recommended to proceed with IV heparin after head CT and repeat head CT for any acute change in neurologic exam  - Start IV heparin, hold beta-blocker, check troponin, check echocardiogram, venous US of LE's, continue cardiac monitoring, neuro checks   - May need hematology consultation given ICH in July, and now acute PE without obvious precipitant   2. Bilateral leg pain and swelling  - Patient unable to ambulate since 9/24  due to bilateral lower leg pain, worse on left  - There is swelling and tenderness noted, no evidence for traumatic or infectious etiology  - Check venous US for DVT    3. History of ICH  - Patient had [place] in July 2020 while on Eliquis for PAF, has  been back home now   - Head CT in ED with no acute findings, expected contraction of right frontal hematoma with encephalomalacia  - Monitor closely while starting anticoagulation   4. Paroxysmal atrial fibrillation  - In sinus rhythm on admission  - CHADS-VASc is 12 (age, CVA x2, CHF, DM) - He had stopped Eliquis after ICH in July 2020, now on IV heparin as above  - Continue amiodarone   5. CKD stage III  - SCr is 1.67, up from 1.25 two weeks earlier after increasing his Lasix  - BP is low-normal and there is no pulmonary edema, a small fluid bolus is being given in ED  - Renally-dose medications, hold Lasix and losartan initially, monitor    6. Chronic diastolic CHF  - Bilateral LE edema noted but reportedly improved after recent increase in Lasix; there is no pulm edema on CTA  - EF was preserved in July 2020  - He has acute PE and BP is low-normal in ED  - Hold beta-blocker, losartan, and Lasix, give a small fluid bolus, then SLIV and monitor daily wt and I/O's   7. Type II DM  - A1c was 6.9% in July 2020  - Managed with metformin and glimepiride at home, held on admission  - Use low-intensity SSI with Novolog while inpatient    8. Seizures  - Developed after ICH, no recent seizure - Continue valproate   9. Acute encephalopathy  - Patient presents with confusion and bilateral lower leg pain and swelling  - No acute findings on head CT  - Patient's wife is at the bedside as feels patient is back to his baseline at time of admission; this was likely related to his hypoxia, will continue to monitor for recurrence    PPE: Mask, face shield. Patient wearing mask.  DVT prophylaxis: IV heparin  Code Status: Full  Family Communication: Wife updated at bedside  Consults called: None  Admission status: Inpatient. This is a complicated case involving acute PE with heart strain and hypoxia in a patient with ICH 2 months ago who is at high-risk for life-threatening complications and  will require inpatient management.     Vianne Bulls, MD [facility] Pager [phone]  If 7PM-7AM, please contact night-coverage [email or web address] Password [email or web address]  01/04/2019, 1:10 AM

## 2019-01-04 NOTE — ED Notes (Signed)
Attempted to call report to [phone] 

## 2019-01-04 NOTE — ED Notes (Signed)
Called lab to add Mag and they stated it had already been added on

## 2019-01-04 NOTE — Progress Notes (Signed)
Patient since the start of my shift has been desating in the 80s to as low as 80%. He has been sleeping a lot and is on CPAP at home. I called [facility] to get a CPap delivered to his room. Patient still continued to desat. 3L of oxygen needed to be bled through machine to keep sats above 90%. I notified Dr. Florene Glen of these findings and order for ABG received. ABG drawn. Will monitor patient. He is on a continous pulse ox. He is not in any distress. Wife at bedside.

## 2019-01-04 NOTE — Progress Notes (Signed)
Over the past 30 minutes, patient has become very restless, he can tell me the year, month, where he is at but he is not appropriate on situation. He has been forgetful today which his wife has said has been the issue since his last admission. Patients neuro assessment is otherwise fine but his agitation seems to be increasing. He stated he wanted to go to sleep, I called [facility] to put his cpap back on. I paged Dr. Florene Glen to make him aware. Awaiting return of call

## 2019-01-04 NOTE — Progress Notes (Signed)
Bilateral lower extremity venous duplex completed. Preliminary results in Chart review CV Proc. [facility], [place] 01/04/2019 4:43 PM

## 2019-01-04 NOTE — Progress Notes (Signed)
ANTICOAGULATION CONSULT NOTE - Initial Consult  Pharmacy Consult for Heparin  Indication: pulmonary embolus  No Known Allergies  Patient Measurements: Height: 5\' 11"  (180.3 cm) Weight: 218 lb (98.9 kg) IBW/kg (Calculated) : 75.3  Heparin dosing weight: 95.6 kg  Vital Signs: Temp: 98.7 F (37.1 C) (09/27 0407) Temp Source: Oral (09/27 0407) BP: 119/72 (09/27 0407) Pulse Rate: 74 (09/27 0407)  Labs: Recent Labs    01/03/19 2053 01/03/19 2204 01/04/19 0305 01/04/19 0457  HGB 11.3* 11.9*  --  10.8*  HCT 34.4* 35.0*  --  32.7*  PLT 243  --   --  237  APTT 29  --   --   --   LABPROT 13.7  --   --   --   INR 1.1  --   --   --   CREATININE 1.67*  --   --  1.55*  TROPONINIHS  --   --  10  --     Estimated Creatinine Clearance: 50.9 mL/min (A) (by C-G formula based on SCr of 1.55 mg/dL (H)).   Medical History: Past Medical History:  Diagnosis Date  . Allergic rhinitis   . Anticoagulant long-term use    eliquis  . Cardiomyopathy due to systemic disease [facility])    followed by dr harding  . COPD with emphysema [facility])    pulmologist-  dr Halford Chessman  . Dyspnea    occasional per pt  . History of colon polyps    tubular adenoma 2013  . History of gout    09-05-2017 last flare-up  05/ 2019 3 wks ago, feet  . History of sepsis 02/18/2017   per d/c note probable uti, acute chf, acute renal failure, hypoxia  . Hyperlipidemia   . Hyperplasia of prostate with lower urinary tract symptoms (LUTS)   . Hypertension   . OSA on CPAP    per study 08-03-2004  Severe OSA  . Persistent atrial fibrillation    cardiologist --  dr Dorris Carnes--  post cardioversion 05-07-2014  . Prostate cancer [facility]) urologsit-  dr Alyson Ingles--- as of 05-21-2017 per pt last PSA 11 approx.   Dx  2014--  stage T1c, Gleason 3+3=6, PSA 6.67--  Active surveillance/  04/ 2019  Stage T1b, Gleason 3+4, PSA 12.8- plan external radiation therapy    . 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 ([place])    pulmologist-  dr Halford Chessman  . Seasonal allergies   . Sigmoid diverticulosis   . Systolic and diastolic CHF, chronic [facility])    cardiologist-  [name]  . Tinea versicolor   . Type 2 diabetes mellitus ([place])   . Wears hearing aid in both ears     Assessment: 73 y/o M with new onset PE with RHS to begin heparin. No current AC PTA. Pt noted to have a recent [place] in July 2020 while on Apixaban, CT head today with no acute bleeding  [name] discussed with Neuro (Dr. Rory Percy), will proceed with heparin with no bolus and lower goal.  Heparin this am is subtherapeutic at <0.1 on 1200 units/hr. H&H is slightly lower at 10.8/32.7, plts wnl.   Goal of Therapy:  Heparin level 0.3-0.5 units/mL, recent ICH in July 2020 Monitor platelets by anticoagulation protocol: Yes   Plan:  No bolus d/t ICH in July  Increase heparin drip to 1550 units/hr (~3.5 units/kg increase, ~16 units/kg) 8 hour HL Daily CBC/HL Monitor for bleeding   Thank you,   Eddie Candle, PharmD PGY-1 Pharmacy Resident  Please check amion for clinical pharmacist contact number

## 2019-01-04 NOTE — Progress Notes (Addendum)
ANTICOAGULATION CONSULT NOTE - Initial Consult  Pharmacy Consult for Heparin  Indication: pulmonary embolus  No Known Allergies  Patient Measurements: Height: 5\' 11"  (180.3 cm) Weight: 218 lb (98.9 kg) IBW/kg (Calculated) : 75.3  Vital Signs: Temp: 100.1 F (37.8 C) (09/26 2042) Temp Source: Rectal (09/26 2042) BP: 101/68 (09/26 2345) Pulse Rate: 77 (09/27 0000)  Labs: Recent Labs    01/03/19 2053 01/03/19 2204  HGB 11.3* 11.9*  HCT 34.4* 35.0*  PLT 243  --   APTT 29  --   LABPROT 13.7  --   INR 1.1  --   CREATININE 1.67*  --     Estimated Creatinine Clearance: 47.2 mL/min (A) (by C-G formula based on SCr of 1.67 mg/dL (H)).   Medical History: Past Medical History:  Diagnosis Date  . Allergic rhinitis   . Anticoagulant long-term use    eliquis  . Cardiomyopathy due to systemic disease [facility])    followed by dr harding  . COPD with emphysema [facility])    pulmologist-  dr Halford Chessman  . Dyspnea    occasional per pt  . History of colon polyps    tubular adenoma 2013  . History of gout    09-05-2017 last flare-up  05/ 2019 3 wks ago, feet  . History of sepsis 02/18/2017   per d/c note probable uti, acute chf, acute renal failure, hypoxia  . Hyperlipidemia   . Hyperplasia of prostate with lower urinary tract symptoms (LUTS)   . Hypertension   . OSA on CPAP    per study 08-03-2004  Severe OSA  . Persistent atrial fibrillation    cardiologist --  dr Dorris Carnes--  post cardioversion 05-07-2014  . Prostate cancer [facility]) urologsit-  dr Alyson Ingles--- as of 05-21-2017 per pt last PSA 11 approx.   Dx  2014--  stage T1c, Gleason 3+3=6, PSA 6.67--  Active surveillance/  04/ 2019  Stage T1b, Gleason 3+4, PSA 12.8- plan external radiation therapy    . 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 ([place])    pulmologist-  dr Halford Chessman  . Seasonal allergies   . Sigmoid diverticulosis   . Systolic and diastolic CHF, chronic [facility])    cardiologist-  [name]  . Tinea  versicolor   . Type 2 diabetes mellitus ([place])   . Wears hearing aid in both ears     Assessment: 73 y/o M with new onset PE to begin heparin. Hgb 11.9. PTA meds reviewed.   Pt noted to have a recent [place] in July 2020 while on Apixaban, CT head today with no acute bleeding  [name] discussed with Neuro (Dr. Rory Percy), will proceed with heparin with no bolus and lower goal  Goal of Therapy:  Heparin level 0.3-0.5 units/mL, recent ICH in July 2020 Monitor platelets by anticoagulation protocol: Yes   Plan:  No bolus Start heparin drip at 1200 units/hr 1000 HL Daily CBC/HL Monitor for bleeding  Narda Bonds, PharmD, BCPS Clinical Pharmacist Phone: [phone]

## 2019-01-04 NOTE — Progress Notes (Signed)
  Echocardiogram 2D Echocardiogram has been performed.  Ronald [name] 01/04/2019, 2:46 PM

## 2019-01-04 NOTE — Progress Notes (Signed)
Secure conversation with bedside RN, RN stated he did ask about Abx and was told that the MD said they would order one if the pt needed it.

## 2019-01-04 NOTE — Progress Notes (Signed)
Came to bedside to evaluate him for agitation Pt Ronald [name]&Ox3 at this time, but per nursing and discussion with wife, increasingly agitated. No focal neuro deficits. Continue delirium precautions [place] if needed.  Wife is agreeable to sit with him if needed (but sounds like he was agitated with her throughout the day - asking why she brought her here).  Will try to avoid sedating meds for now, but consider seroquel if needed. Discussed with nursing.

## 2019-01-04 NOTE — Progress Notes (Addendum)
PROGRESS NOTE    GRAVES HANSON  [id number] DOB: 10/06/1945 DOA: 01/03/2019 PCP: [name], MD   Brief Narrative:  Ronald [name] is Ronald [name] 73 y.o. male with medical history significant for paroxysmal atrial fibrillation no longer on Eliquis after ICH in July 2020, COPD, OSA on CPAP, chronic diastolic CHF, type 2 diabetes mellitus, and seizure disorder after his ICH, now presenting to the emergency department with lower leg pain and confusion.  Patient is accompanied by his wife who assists with the history.  He has been doing well back at home, ambulating on his own with no appreciable residual weakness from his stroke, but has been unable to ambulate since 01/01/2019 due to bilateral lower leg pain that is much worse on the left.  He was also having bilateral leg swelling that improved after increasing his Lasix.  His cognitive function remained stable until the afternoon of 01/03/2019 when the patient seemed to be confused.  He has had some transient episodes of confusion since the [place] in July.  EMS was called, patient was found to be saturating in the mid 80s on room air, he was placed on supplemental oxygen, and brought into the ED.  He denies any chest pain or palpitations.  Denies any melena or hematochezia.  Denies headache or acute change in his vision or hearing, or focal numbness or weakness.  ED Course: Upon arrival to the ED, patient is found to be afebrile, saturating 89% on 3 L/min of supplemental oxygen, and with blood pressure 98/58.  EKG features Jayko Voorhees sinus rhythm and chest x-ray is notable for cardiomegaly with low lung volumes.  Noncontrast head CT is negative for acute findings but the expected contraction of right frontal hematoma is noted with encephalomalacia.  Chemistry panel features Shea Swalley sodium of 133, potassium 3.3, and creatinine 1.67, up from 1.25 earlier this month.  CBC features Denai Caba mild normocytic anemia.  CTA chest reveals acute PE involving the right lower lobe with  evidence for heart strain.  COVID-19 testing is in process.  Assessment & Plan:   Principal Problem:   Acute pulmonary embolism (HCC) Active Problems:   Type 2 diabetes mellitus with hyperglycemia (HCC)   Paroxysmal atrial fibrillation (HCC)   Seizures ([place]), secondsry to ICH   Acute encephalopathy   CKD (chronic kidney disease), stage III (HCC)   Acute respiratory failure with hypoxia ([place])   1. Submassive PE   Acute Hypoxic Resp Failure  - Presents with 3 days of bilateral lower leg pain, mainly on left, Angas Isabell few hours of confusion, and was found to be hypoxic with low-normal BP  - CTA reveals acute PE in RLL with evidence for right heart strain  - PESI class V (male, COPD, confusion, and hypoxia) - 173, very high risk - he's hemodynamically stable with mild oxygen requirement -  - He is not having chest pain and BP has remained stable - his prior intracranial hemorrhage is contraindication for lytics - BNP is elevated, but lower than prior - Discussed treatment with neurology in light of [place] on 10/21/18 -> recommended repeat head CT with follow up head CT in 24-48 hours prior to switching to oral anticoagulation. - Start IV heparin, hold beta-blocker, check troponin, check echocardiogram, venous US of LE's, continue cardiac monitoring, neuro checks   - Consider discussion with hematology consultation given ICH in July regarding which oral anticoagulant  - ABG obtained due to hypoxia and confusion, no evidence of hypercarbia - titrate O2 for sat of  90-95.  2. Bilateral leg pain and swelling  - Patient unable to ambulate since 9/24 due to bilateral lower leg pain, worse on left  - There is swelling and tenderness noted, no evidence for traumatic or infectious etiology  - Check venous US for DVT    3. History of ICH  - Patient had [place] in July 2020 while on Eliquis for PAF, has been back home now   - Head CT in ED with no acute findings, expected contraction of right frontal hematoma  with encephalomalacia  - Monitor closely while starting anticoagulation   4. Paroxysmal atrial fibrillation  - In sinus rhythm on admission  - CHADS-VASc is 12 (age, CVA x2, CHF, DM) - He had stopped Eliquis after ICH in July 2020, now on IV heparin as above  - Continue amiodarone   5. CKD stage III  - SCr is 1.67, up from 1.25 two weeks earlier after increasing his Lasix  - BP is low-normal and there is no pulmonary edema, Nolie Bignell small fluid bolus is being given in ED  - Renally-dose medications, hold Lasix and losartan initially, monitor   - follow  6. Chronic diastolic CHF  - Bilateral LE edema noted but reportedly improved after recent increase in Lasix; there is no pulm edema on CTA  - EF was preserved in July 2020  - He has acute PE and BP is low-normal in ED  - Hold beta-blocker, losartan, and Lasix, give Drelyn Pistilli small fluid bolus, then SLIV and monitor daily wt and I/O's   7. Type II DM  - A1c was 6.9% in July 2020  - Managed with metformin and glimepiride at home, held on admission  - Use low-intensity SSI with Novolog while inpatient    8. Seizures  - Developed after ICH, no recent seizure - Continue valproate   9. Acute encephalopathy  - Patient presents with confusion and bilateral lower leg pain and swelling  - No acute findings on head CT  - Patient's wife is at the bedside as feels patient is back to his baseline at time of admission; this was likely related to his hypoxia, will continue to monitor for recurrence  - abg as above - delirium precautions - hold seroquel  Hypokalemia   Hypomagnesemia: replace and follow  DVT prophylaxis: heparin  Code Status: full  Family Communication: wife at bedside Disposition Plan: inpatient given submassive PE   Consultants:   Neuro curb side  Procedures:  Pending echo and LE [place]  Antimicrobials:  Anti-infectives (From admission, onward)   None     Subjective: C/o bilateral LE pain for past few days Wife brought  him in for confusion  Objective: Vitals:   01/04/19 1100 01/04/19 1213 01/04/19 1405 01/04/19 1408  BP:  117/71  128/75  Pulse: 78 74 75 76  Resp: (!) 24 20 (!) 22 20  Temp:      TempSrc:      SpO2: 96% 98% 95% 97%  Weight:      Height:        Intake/Output Summary (Last 24 hours) at 01/04/2019 1418 Last data filed at 01/04/2019 1014 Gross per 24 hour  Intake 459.45 ml  Output 400 ml  Net 59.45 ml   Filed Weights   01/03/19 2039  Weight: 98.9 kg    Examination:  General exam: Appears calm and comfortable  Respiratory system: Clear to auscultation. Respiratory effort normal. Cardiovascular system: S1 & S2 heard, RRR.  Gastrointestinal system: Abdomen is nondistended, soft  and nontender. Central nervous system: Alert and oriented, somewhat drowsy. No focal neurological deficits. Extremities: bilateral LE edema, L>R Skin: No rashes, lesions or ulcers Psychiatry: Judgement and insight appear normal. Mood & affect appropriate.     Data Reviewed: I have personally reviewed following labs and imaging studies  CBC: Recent Labs  Lab 01/03/19 2053 01/03/19 2204 01/04/19 0457  WBC 6.0  --  5.4  NEUTROABS 4.1  --   --   HGB 11.3* 11.9* 10.8*  HCT 34.4* 35.0* 32.7*  MCV 99.7  --  97.9  PLT 243  --  [id number]   Basic Metabolic Panel: Recent Labs  Lab 01/03/19 2053 01/03/19 2204 01/04/19 0305 01/04/19 0457  NA 133* 135  --  134*  K 3.3* 3.0*  --  3.0*  CL 93*  --   --  96*  CO2 27  --   --  27  GLUCOSE 169*  --   --  131*  BUN 20  --   --  19  CREATININE 1.67*  --   --  1.55*  CALCIUM 8.2*  --   --  7.9*  MG  --   --  1.6* 1.5*   GFR: Estimated Creatinine Clearance: 50.9 mL/min (Siddhi Dornbush) (by C-G formula based on SCr of 1.55 mg/dL (H)). Liver Function Tests: Recent Labs  Lab 01/03/19 2053  AST 17  ALT 18  ALKPHOS 75  BILITOT 0.9  PROT 5.6*  ALBUMIN 2.7*   No results for input(s): LIPASE, AMYLASE in the last 168 hours. No results for input(s): AMMONIA in the  last 168 hours. Coagulation Profile: Recent Labs  Lab 01/03/19 2053  INR 1.1   Cardiac Enzymes: No results for input(s): CKTOTAL, CKMB, CKMBINDEX, TROPONINI in the last 168 hours. BNP (last 3 results) No results for input(s): PROBNP in the last 8760 hours. HbA1C: No results for input(s): HGBA1C in the last 72 hours. CBG: Recent Labs  Lab 01/03/19 2038 01/04/19 0411 01/04/19 0752 01/04/19 1139  GLUCAP 178* 130* 115* 124*   Lipid Profile: No results for input(s): CHOL, HDL, LDLCALC, TRIG, CHOLHDL, LDLDIRECT in the last 72 hours. Thyroid Function Tests: No results for input(s): TSH, T4TOTAL, FREET4, T3FREE, THYROIDAB in the last 72 hours. Anemia Panel: No results for input(s): VITAMINB12, FOLATE, FERRITIN, TIBC, IRON, RETICCTPCT in the last 72 hours. Sepsis Labs: Recent Labs  Lab 01/03/19 2053 01/03/19 2230  LATICACIDVEN 1.1 1.2    Recent Results (from the past 240 hour(s))  Novel Coronavirus, NAA (Hosp order, Send-out to Ref Lab; TAT 18-24 hrs     Status: None   Collection Time: 12/26/18 11:28 AM   Specimen: Nasopharyngeal Swab; Respiratory  Result Value Ref Range Status   SARS-CoV-2, NAA NOT DETECTED NOT DETECTED Final    Comment: (NOTE) This nucleic acid amplification test was developed and its performance characteristics determined by [employer]. Nucleic acid amplification tests include PCR and TMA. This test has not been FDA cleared or approved. This test has been authorized by FDA under an Emergency Use Authorization (EUA). This test is only authorized for the duration of time the declaration that circumstances exist justifying the authorization of the emergency use of in vitro diagnostic tests for detection of SARS-CoV-2 virus and/or diagnosis of COVID-19 infection under section 564(b)(1) of the Act, 21 U.S.C. [id number]) (1), unless the authorization is terminated or revoked sooner. When diagnostic testing is negative, the possibility of Kynnadi Dicenso  false negative result should be considered in the context of Edom Schmuhl patient's recent exposures  and the presence of clinical signs and symptoms consistent with COVID-19. An individual without symptoms of COVID- 19 and who is not shedding SARS-CoV-2 vi rus would expect to have Josee Speece negative (not detected) result in this assay. Performed At: [facility] [address] [place], [place] [id number] Rush Farmer MD [id number]    [place]  Final    Comment: Performed at [facility], [place] [address], [place], [place] [address]  Blood Culture (routine x 2)     Status: None (Preliminary result)   Collection Time: 01/03/19  8:56 PM   Specimen: BLOOD  Result Value Ref Range Status   Specimen Description BLOOD RIGHT ARM  Final   Special Requests   Final    BOTTLES DRAWN AEROBIC AND ANAEROBIC Blood Culture results may not be optimal due to an excessive volume of blood received in culture bottles   Culture   Final    NO GROWTH < 12 HOURS Performed at [facility], [place] [address] [place], [place] [address]    Report Status PENDING  Incomplete  Blood Culture (routine x 2)     Status: None (Preliminary result)   Collection Time: 01/03/19  8:58 PM   Specimen: BLOOD  Result Value Ref Range Status   Specimen Description BLOOD RIGHT ARM  Final   Special Requests   Final    BOTTLES DRAWN AEROBIC AND ANAEROBIC Blood Culture results may not be optimal due to an excessive volume of blood received in culture bottles   Culture   Final    NO GROWTH < 12 HOURS Performed at [facility], [place] [address], [place], [place] [address]    Report Status PENDING  Incomplete  SARS CORONAVIRUS 2 (TAT 6-24 HRS) Nasopharyngeal Nasopharyngeal Swab     Status: None   Collection Time: 01/03/19  9:38 PM   Specimen: Nasopharyngeal Swab  Result Value Ref Range Status   SARS Coronavirus 2 NEGATIVE NEGATIVE Final    Comment: (NOTE) SARS-CoV-2 target nucleic acids are NOT  DETECTED. The SARS-CoV-2 RNA is generally detectable in upper and lower respiratory specimens during the acute phase of infection. Negative results do not preclude SARS-CoV-2 infection, do not rule out co-infections with other pathogens, and should not be used as the sole basis for treatment or other patient management decisions. Negative results must be combined with clinical observations, patient history, and epidemiological information. The expected result is Negative. Fact Sheet for Patients: SugarRoll.be Fact Sheet for Healthcare Providers: https://www.woods-mathews.com/ This test is not yet approved or cleared by the [place] FDA and  has been authorized for detection and/or diagnosis of SARS-CoV-2 by FDA under an Emergency Use Authorization (EUA). This EUA will remain  in effect (meaning this test can be used) for the duration of the COVID-19 declaration under Section 56 4(b)(1) of the Act, 21 U.S.C. section 360bbb-3(b)(1), unless the authorization is terminated or revoked sooner. Performed at [facility], [place] [address] [place], [place] [address]          Radiology Studies: Ct Head Wo Contrast  Result Date: 01/03/2019 CLINICAL DATA:  Altered level of consciousness. EXAM: CT HEAD WITHOUT CONTRAST TECHNIQUE: Contiguous axial images were obtained from the base of the skull through the vertex without intravenous contrast. COMPARISON:  Head CT and brain MRI 11/22/2018 FINDINGS: Brain: Expected contraction of subacute parafalcine right frontal hematoma with developing encephalomalacia. No new or acute hemorrhage. No evidence of acute ischemia. No hydrocephalus. No midline shift. Stable  generalized cerebral atrophy and chronic small vessel ischemia. Vascular: Atherosclerosis of skullbase vasculature without hyperdense vessel or abnormal calcification. Skull: No fracture or focal lesion. Sinuses/Orbits: Paranasal sinuses and  mastoid air cells are clear. The visualized orbits are unremarkable. Bilateral lens extraction. Other: None. IMPRESSION: 1. No acute intracranial abnormality. 2. Expected contraction of subacute parafalcine right frontal hematoma over the last 6 weeks with encephalomalacia. 3. Stable atrophy and chronic small vessel ischemia. Electronically Signed   By: Keith Rake M.D.   On: 01/03/2019 21:45   Ct Angio Chest Pe W And/or Wo Contrast  Addendum Date: 01/04/2019   ADDENDUM REPORT: 01/04/2019 00:50 ADDENDUM: Dilation of the ascending thoracic aorta to 4.1 cm. Recommend annual imaging followup by CTA or MRA. This recommendation follows 2010 ACCF/AHA/AATS/ACR/ASA/SCA/SCAI/SIR/STS/SVM Guidelines for the Diagnosis and Management of Patients with Thoracic Aortic Disease. Circulation. 2010; [id number]. Aortic aneurysm NOS (ICD10-I71.9) Electronically Signed   By: Lovena Le M.D.   On: 01/04/2019 00:50   Result Date: 01/04/2019 CLINICAL DATA:  Altered mental status, starting mid day, abdominal pain and distension, PE suspected, positive D-dimer EXAM: CT ANGIOGRAPHY CHEST WITH CONTRAST TECHNIQUE: Multidetector CT imaging of the chest was performed using the standard protocol during bolus administration of intravenous contrast. Multiplanar CT image reconstructions and MIPs were obtained to evaluate the vascular anatomy. CONTRAST:  16mL OMNIPAQUE IOHEXOL 350 MG/ML SOLN COMPARISON:  CT chest 01/31/2015 FINDINGS: Cardiovascular: Satisfactory opacification of pulmonary arteries to the segmental level. Filling defects are present in the pulmonary arteries of the superior segment and posterior basal segment of the right lower lobe. No other visible pulmonary artery filling defects are evident. There is central pulmonary arterial enlargement, increased from comparison study. There is borderline elevation of the RV/LV ratio (0.95) with flattening of the intraventricular septum. Borderline cardiomegaly. No pericardial  effusion. Ascending aorta is dilated to 4.1 cm. There is atherosclerotic calcification of the aortic arch and proximal great vessels. Suboptimal contrast opacification of the aorta limits luminal evaluation. Mediastinum/Nodes: No enlarged mediastinal or axillary lymph nodes. Thyroid gland, trachea, and esophagus demonstrate no significant findings. Lungs/Pleura: Lung volumes are diminished with extensive areas of atelectasis throughout both lungs. More subpleural reticulation is noted in the upper lobes with some peribronchial thickening and bronchiectatic changes throughout both lungs. Subpleural cyst noted in the right lower lung. Latandra Loureiro previously seen 5 mm nodule in the left lower lobe is poorly visualized on today's exam due to dependent atelectasis in the lung base. Lobular 12 mm nodule in the lingula associated with Jorita Bohanon bandlike area of scarring may reflect rounded atelectasis, unchanged from prior. Upper Abdomen: No acute abnormalities present in the visualized portions of the upper abdomen. Abdominal aortic atherosclerosis is evident. Musculoskeletal: No acute osseous abnormality or suspicious osseous lesion. Multilevel degenerative changes are present in the imaged portions of the spine. Review of the MIP images confirms the above findings. IMPRESSION: 1. Positive for acute PE with CT evidence of right heart strain (RV/LV Ratio = 0.95, flattening of the intraventricular septum, central pulmonary artery enlargement) consistent with at least submassive (intermediate risk) PE. The presence of right heart strain has been associated with an increased risk of morbidity and mortality. Please activate Code PE by paging [phone]. 2. Extensive areas of atelectasis likely accentuated by imaging during exhalation. 3. Suspect Anouk Critzer region of rounded atelectasis or scarring within the lingula, stable from comparison exam in 2019. 4.  Aortic Atherosclerosis (ICD10-I70.0).  Coronary atherosclerosis. Critical Value/emergent  results were called by telephone at the time of interpretation on  01/04/2019 at 12:05 am to providerCardama MD, who verbally acknowledged these results. Electronically Signed: By: Lovena Le M.D. On: 01/04/2019 00:08   Dg Chest Port 1 View  Result Date: 01/03/2019 CLINICAL DATA:  Altered mental status. EXAM: PORTABLE CHEST 1 VIEW COMPARISON:  Chest radiograph 07/26/2017. There is Valton Schwartz lesion chest CT 03/17/2018. FINDINGS: Low lung volumes. Cardiomegaly. No obvious pulmonary edema. Left basilar evaluation is limited due to soft tissue attenuation. No evidence of acute airspace disease or pneumothorax. IMPRESSION: 1. Low lung volumes limit assessment, particularly at the left lung base. 2. Cardiomegaly. Electronically Signed   By: Keith Rake M.D.   On: 01/03/2019 21:13        Scheduled Meds:  amiodarone  200 mg Oral Daily   atorvastatin  10 mg Oral QPM   divalproex  500 mg Oral Q12H   insulin aspart  0-9 Units Subcutaneous Q4H   mometasone-formoterol  2 puff Inhalation BID   QUEtiapine  25 mg Oral QHS   sodium chloride flush  3 mL Intravenous Q12H   sodium chloride flush  3 mL Intravenous Q12H   Continuous Infusions:  sodium chloride     heparin 1,500 Units/hr (01/04/19 1005)   sodium chloride       LOS: 0 days    Time spent: over 30 min - 40 min critical care time with submassive PE    Fayrene Helper, MD [facility] Pager AMION  If 7PM-7AM, please contact night-coverage [email or web address] Password [email or web address] 01/04/2019, 2:18 PM

## 2019-01-05 LAB — COMPREHENSIVE METABOLIC PANEL
ALT: 25 U/L (ref 0–44)
AST: 25 U/L (ref 15–41)
Albumin: 2.4 g/dL — ABNORMAL LOW (ref 3.5–5.0)
Alkaline Phosphatase: 77 U/L (ref 38–126)
Anion gap: 10 (ref 5–15)
BUN: 18 mg/dL (ref 8–23)
CO2: 27 mmol/L (ref 22–32)
Calcium: 8.5 mg/dL — ABNORMAL LOW (ref 8.9–10.3)
Chloride: 99 mmol/L (ref 98–111)
Creatinine, Ser: 1.5 mg/dL — ABNORMAL HIGH (ref 0.61–1.24)
GFR calc Af Amer: 53 mL/min — ABNORMAL LOW (ref >60)
GFR calc non Af Amer: 46 mL/min — ABNORMAL LOW (ref >60)
Glucose, Bld: 135 mg/dL — ABNORMAL HIGH (ref 70–99)
Potassium: 3.2 mmol/L — ABNORMAL LOW (ref 3.5–5.1)
Sodium: 136 mmol/L (ref 135–145)
Total Bilirubin: 1 mg/dL (ref 0.3–1.2)
Total Protein: 5.6 g/dL — ABNORMAL LOW (ref 6.5–8.1)

## 2019-01-05 LAB — CBC
HCT: 32.4 % — ABNORMAL LOW (ref 39.0–52.0)
Hemoglobin: 11 g/dL — ABNORMAL LOW (ref 13.0–17.0)
MCH: 32.9 pg (ref 26.0–34.0)
MCHC: 34 g/dL (ref 30.0–36.0)
MCV: 97 fL (ref 80.0–100.0)
Platelets: 247 10*3/uL (ref 150–400)
RBC: 3.34 MIL/uL — ABNORMAL LOW (ref 4.22–5.81)
RDW: 13.2 % (ref 11.5–15.5)
WBC: 6 10*3/uL (ref 4.0–10.5)
nRBC: 0 % (ref 0.0–0.2)

## 2019-01-05 LAB — GLUCOSE, CAPILLARY
Glucose-Capillary: 131 mg/dL — ABNORMAL HIGH (ref 70–99)
Glucose-Capillary: 143 mg/dL — ABNORMAL HIGH (ref 70–99)
Glucose-Capillary: 151 mg/dL — ABNORMAL HIGH (ref 70–99)
Glucose-Capillary: 169 mg/dL — ABNORMAL HIGH (ref 70–99)
Glucose-Capillary: 171 mg/dL — ABNORMAL HIGH (ref 70–99)
Glucose-Capillary: 217 mg/dL — ABNORMAL HIGH (ref 70–99)

## 2019-01-05 LAB — MAGNESIUM: Magnesium: 2.1 mg/dL (ref 1.7–2.4)

## 2019-01-05 LAB — URIC ACID: Uric Acid, Serum: 5.8 mg/dL (ref 3.7–8.6)

## 2019-01-05 LAB — URINE CULTURE

## 2019-01-05 LAB — HEPARIN LEVEL (UNFRACTIONATED)
Heparin Unfractionated: 0.26 IU/mL — ABNORMAL LOW (ref 0.30–0.70)
Heparin Unfractionated: 0.17 IU/mL — ABNORMAL LOW (ref 0.30–0.70)
Heparin Unfractionated: <0.1 IU/mL — ABNORMAL LOW (ref 0.30–0.70)

## 2019-01-05 MED ORDER — ACETAMINOPHEN 500 MG PO TABS
1000.0000 mg | ORAL_TABLET | Freq: Three times a day (TID) | ORAL | Status: AC
  Administered 2019-01-05 – 2019-01-08 (×9): 1000 mg via ORAL
  Filled 2019-01-05 (×9): qty 2

## 2019-01-05 MED ORDER — INSULIN ASPART 100 UNIT/ML ~~LOC~~ SOLN
0.0000 [IU] | Freq: Three times a day (TID) | SUBCUTANEOUS | Status: DC
  Administered 2019-01-06: 1 [IU] via SUBCUTANEOUS
  Administered 2019-01-06 (×2): 2 [IU] via SUBCUTANEOUS
  Administered 2019-01-07: 1 [IU] via SUBCUTANEOUS
  Administered 2019-01-07 – 2019-01-08 (×3): 2 [IU] via SUBCUTANEOUS
  Administered 2019-01-08: 3 [IU] via SUBCUTANEOUS

## 2019-01-05 MED ORDER — POTASSIUM CHLORIDE CRYS ER 20 MEQ PO TBCR
40.0000 meq | EXTENDED_RELEASE_TABLET | ORAL | Status: AC
  Administered 2019-01-05 (×2): 40 meq via ORAL
  Filled 2019-01-05 (×2): qty 2

## 2019-01-05 MED ORDER — OXYCODONE HCL 5 MG PO TABS
5.0000 mg | ORAL_TABLET | ORAL | Status: DC | PRN
  Administered 2019-01-08: 5 mg via ORAL
  Filled 2019-01-05: qty 1

## 2019-01-05 MED ORDER — NICOTINE 21 MG/24HR TD PT24
21.0000 mg | MEDICATED_PATCH | Freq: Every day | TRANSDERMAL | Status: DC
  Administered 2019-01-05 – 2019-01-08 (×4): 21 mg via TRANSDERMAL
  Filled 2019-01-05 (×4): qty 1

## 2019-01-05 MED ORDER — QUETIAPINE FUMARATE 25 MG PO TABS
25.0000 mg | ORAL_TABLET | Freq: Every day | ORAL | Status: DC
  Administered 2019-01-05 – 2019-01-08 (×4): 25 mg via ORAL
  Filled 2019-01-05 (×4): qty 1

## 2019-01-05 MED ORDER — COLCHICINE 0.6 MG PO TABS
1.2000 mg | ORAL_TABLET | Freq: Once | ORAL | Status: AC
  Administered 2019-01-05: 1.2 mg via ORAL
  Filled 2019-01-05: qty 2

## 2019-01-05 MED ORDER — COLCHICINE 0.6 MG PO TABS
0.6000 mg | ORAL_TABLET | Freq: Two times a day (BID) | ORAL | Status: DC
  Administered 2019-01-06 – 2019-01-09 (×7): 0.6 mg via ORAL
  Filled 2019-01-05 (×7): qty 1

## 2019-01-05 MED ORDER — COLCHICINE 0.6 MG PO TABS
0.6000 mg | ORAL_TABLET | Freq: Once | ORAL | Status: AC
  Administered 2019-01-05: 0.6 mg via ORAL
  Filled 2019-01-05: qty 1

## 2019-01-05 NOTE — Progress Notes (Signed)
Placed patient on CPAP for the night with pressure set at 10cm and oxygen set at 2lpm

## 2019-01-05 NOTE — Progress Notes (Signed)
ANTICOAGULATION CONSULT NOTE   Pharmacy Consult for Heparin  Indication: pulmonary embolus  No Known Allergies  Patient Measurements: Height: 5\' 11"  (180.3 cm) Weight: 217 lb 14.4 oz (98.8 kg) IBW/kg (Calculated) : 75.3  Vital Signs: Temp: 99.7 F (37.6 C) (09/28 0540) Temp Source: Oral (09/28 0540) BP: 125/67 (09/28 0540) Pulse Rate: 81 (09/28 0540)  Labs: Recent Labs    01/03/19 2053 01/03/19 2204 01/04/19 0305 01/04/19 0457  01/04/19 1835 01/05/19 0531 01/05/19 1252  HGB 11.3* 11.9*  --  10.8*  --   --  11.0*  --   HCT 34.4* 35.0*  --  32.7*  --   --  32.4*  --   PLT 243  --   --  237  --   --  247  --   APTT 29  --   --   --   --   --   --   --   LABPROT 13.7  --   --   --   --   --   --   --   INR 1.1  --   --   --   --   --   --   --   HEPARINUNFRC  --   --   --   --    < > 0.12* 0.26* 0.17*  CREATININE 1.67*  --   --  1.55*  --   --  1.50*  --   TROPONINIHS  --   --  10  --   --   --   --   --    < > = values in this interval not displayed.    Estimated Creatinine Clearance: 52.5 mL/min (A) (by C-G formula based on SCr of 1.5 mg/dL (H)).   Medical History: Past Medical History:  Diagnosis Date  . Allergic rhinitis   . Anticoagulant long-term use    eliquis  . Cardiomyopathy due to systemic disease [facility])    followed by dr harding  . COPD with emphysema [facility])    pulmologist-  dr Halford Chessman  . Dyspnea    occasional per pt  . History of colon polyps    tubular adenoma 2013  . History of gout    09-05-2017 last flare-up  05/ 2019 3 wks ago, feet  . History of sepsis 02/18/2017   per d/c note probable uti, acute chf, acute renal failure, hypoxia  . Hyperlipidemia   . Hyperplasia of prostate with lower urinary tract symptoms (LUTS)   . Hypertension   . OSA on CPAP    per study 08-03-2004  Severe OSA  . Persistent atrial fibrillation    cardiologist --  dr Dorris Carnes--  post cardioversion 05-07-2014  . Prostate cancer [facility]) urologsit-  dr Alyson Ingles--- as  of 05-21-2017 per pt last PSA 11 approx.   Dx  2014--  stage T1c, Gleason 3+3=6, PSA 6.67--  Active surveillance/  04/ 2019  Stage T1b, Gleason 3+4, PSA 12.8- plan external radiation therapy    . 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 ([place])    pulmologist-  dr Halford Chessman  . Seasonal allergies   . Sigmoid diverticulosis   . Systolic and diastolic CHF, chronic [facility])    cardiologist-  [name]  . Tinea versicolor   . Type 2 diabetes mellitus ([place])   . Wears hearing aid in both ears     Assessment: 73 y/o M with new onset PE to begin heparin. Hgb 11.9. PTA meds reviewed.  Pt noted to have a recent [place] in July 2020 while on Apixaban, CT head today with no acute bleeding noted.   [name] discussed with Neuro (Dr. Rory Percy), will proceed with heparin with no bolus and lower goal.  Heparin level still low after adjustment this morning. No bleeding issues noted.   Goal of Therapy:  Heparin level 0.3-0.5 units/mL, recent ICH in July 2020 Monitor platelets by anticoagulation protocol: Yes   Plan:  No boluses Inc heparin to 2000 units/hr Re-check heparin level in 8 hours Daily CBC/HL Monitor for bleeding  Erin Hearing PharmD., BCPS Clinical Pharmacist 01/05/2019 1:52 PM

## 2019-01-05 NOTE — Progress Notes (Signed)
PROGRESS NOTE    Ronald [name]  [id number] DOB: 06/05/1945 DOA: 01/03/2019 PCP: Ronald Post, MD   Brief Narrative:  Ronald [name] is Ronald [name] 73 y.o. male with medical history significant for paroxysmal atrial fibrillation no longer on Eliquis after ICH in July 2020, COPD, OSA on CPAP, chronic diastolic CHF, type 2 diabetes mellitus, and seizure disorder after his ICH, now presenting to the emergency department with lower leg pain and confusion.  Patient is accompanied by his wife who assists with the history.  He has been doing well back at home, ambulating on his own with no appreciable residual weakness from his stroke, but has been unable to ambulate since 01/01/2019 due to bilateral lower leg pain that is much worse on the left.  He was also having bilateral leg swelling that improved after increasing his Lasix.  His cognitive function remained stable until the afternoon of 01/03/2019 when the patient seemed to be confused.  He has had some transient episodes of confusion since the [place] in July.  EMS was called, patient was found to be saturating in the mid 80s on room air, he was placed on supplemental oxygen, and brought into the ED.  He denies any chest pain or palpitations.  Denies any melena or hematochezia.  Denies headache or acute change in his vision or hearing, or focal numbness or weakness.  ED Course: Upon arrival to the ED, patient is found to be afebrile, saturating 89% on 3 L/min of supplemental oxygen, and with blood pressure 98/58.  EKG features Ronald [name] sinus rhythm and chest x-ray is notable for cardiomegaly with low lung volumes.  Noncontrast head CT is negative for acute findings but the expected contraction of right frontal hematoma is noted with encephalomalacia.  Chemistry panel features Ronald [name] sodium of 133, potassium 3.3, and creatinine 1.67, up from 1.25 earlier this month.  CBC features Ronald [name] mild normocytic anemia.  CTA chest reveals acute PE involving the right lower lobe with  evidence for heart strain.  COVID-19 testing is in process.  Assessment & Plan:   Principal Problem:   Acute pulmonary embolism (HCC) Active Problems:   Type 2 diabetes mellitus with hyperglycemia (HCC)   Paroxysmal atrial fibrillation (HCC)   Seizures (Ronald [name]), secondsry to ICH   Acute encephalopathy   CKD (chronic kidney disease), stage III (HCC)   Acute respiratory failure with hypoxia (Ronald [name])   1. Submassive PE   Acute Hypoxic Resp Failure  - Presents with 3 days of bilateral lower leg pain, mainly on left, Ronald [name] few hours of confusion, and was found to be hypoxic with low-normal BP  - CTA reveals acute PE in RLL with evidence for right heart strain  - PESI class V (male, COPD, confusion, and hypoxia) - 173, very high risk - he's hemodynamically stable with mild oxygen requirement - on 2 L  today - He is not having chest pain and BP has remained stable - his prior intracranial hemorrhage is contraindication for lytics - BNP is elevated, but lower than prior - Discussed treatment with neurology in light of [place] on 10/21/18 -> recommended repeat head CT with follow up head CT in 24-48 hours prior to switching to oral anticoagulation. - Continue heparin gtt - will plan for 72 hrs given high risk and then transition to oral anticoagulation after repeat head CT - echo with normal EF - normal RV systolic function (see report) - prelim LE [place] without DVT - BNP elevated, high sensitivity troponin wnl - Consider  discussion with hematology consultation given ICH in July regarding which oral anticoagulant  - ABG obtained due to hypoxia and confusion, no evidence of hypercarbia - titrate O2 for sat of 90-95.  2. Bilateral leg pain and swelling   Concern for Gout Flare - Patient unable to ambulate since 9/24 due to bilateral lower leg pain, worse on left  - There is swelling and tenderness noted, no evidence for traumatic or infectious etiology  - Check venous US for DVT (negative as above) - start  colchicine for concern for gout flare - follow uric acid level (5.8)  3. History of ICH  - Patient had [place] in July 2020 while on Eliquis for PAF, has been back home now   - Head CT in ED with no acute findings, expected contraction of right frontal hematoma with encephalomalacia  - Monitor closely while starting anticoagulation   4. Paroxysmal atrial fibrillation  - In sinus rhythm on admission  - CHADS-VASc is 49 (age, CVA x2, CHF, DM) - He had stopped Eliquis after ICH in July 2020, now on IV heparin as above  - Continue amiodarone   5. CKD stage III  - SCr is 1.67, up from 1.25 two weeks earlier after increasing his Lasix  - BP is low-normal and there is no pulmonary edema, Ronald [name] small fluid bolus is being given in ED  - Renally-dose medications, hold Lasix and losartan initially, monitor   - 1.5 today - follow  6. Chronic diastolic CHF  - Bilateral LE edema noted but reportedly improved after recent increase in Lasix; there is no pulm edema on CTA  - EF was preserved in July 2020  - He has acute PE  - continue to hold BB, losartan, and lasix - Follow I/O, dasily weights  7. Type II DM  - A1c was 6.9% in July 2020  - Managed with metformin and glimepiride at home, held on admission  - Use low-intensity SSI with Novolog while inpatient    8. Seizures  - Developed after ICH, no recent seizure - Continue valproate   9. Acute encephalopathy  - Patient presents with confusion and bilateral lower leg pain and swelling  - No acute findings on head CT  - Seems to be imrpoving  - delirium precautions - will resume home seroquel with improvement  Hypokalemia   Hypomagnesemia: replace and follow  DVT prophylaxis: heparin  Code Status: full  Family Communication: wife at bedside Disposition Plan: inpatient given submassive PE   Consultants:   Neuro curb side  Procedures:  Pending echo and LE [place]  Antimicrobials:  Anti-infectives (From admission, onward)   None       Subjective: C/o gout pain to L>R le No CP Ronald [name]&Ox3 Wife at bedside  Objective: Vitals:   01/04/19 2058 01/05/19 0017 01/05/19 0540 01/05/19 0827  BP:  116/63 125/67   Pulse: 85 85 81   Resp: 19 13 18    Temp:  98.4 F (36.9 C) 99.7 F (37.6 C)   TempSrc:  Axillary Oral   SpO2: 95% 96% 99% 95%  Weight:   98.8 kg   Height:        Intake/Output Summary (Last 24 hours) at 01/05/2019 1339 Last data filed at 01/05/2019 0956 Gross per 24 hour  Intake 243 ml  Output 700 ml  Net -457 ml   Filed Weights   01/03/19 2039 01/05/19 0540  Weight: 98.9 kg 98.8 kg    Examination:  General: No acute distress. Cardiovascular: Heart sounds  show Brendalyn Vallely regular rate, and rhythm Lungs: Clear to auscultation bilaterally  Abdomen: Soft, nontender, nondistended Neurological: Alert and oriented 3. Moves all extremities 4. Cranial nerves II through XII grossly intact. Skin: Warm and dry. No rashes or lesions. Extremities: bilateral LEE, L MTP redness and TTP      Data Reviewed: I have personally reviewed following labs and imaging studies  CBC: Recent Labs  Lab 01/03/19 2053 01/03/19 2204 01/04/19 0457 01/05/19 0531  WBC 6.0  --  5.4 6.0  NEUTROABS 4.1  --   --   --   HGB 11.3* 11.9* 10.8* 11.0*  HCT 34.4* 35.0* 32.7* 32.4*  MCV 99.7  --  97.9 97.0  PLT 243  --  237 [id number]   Basic Metabolic Panel: Recent Labs  Lab 01/03/19 2053 01/03/19 2204 01/04/19 0305 01/04/19 0457 01/05/19 0531  NA 133* 135  --  134* 136  K 3.3* 3.0*  --  3.0* 3.2*  CL 93*  --   --  96* 99  CO2 27  --   --  27 27  GLUCOSE 169*  --   --  131* 135*  BUN 20  --   --  19 18  CREATININE 1.67*  --   --  1.55* 1.50*  CALCIUM 8.2*  --   --  7.9* 8.5*  MG  --   --  1.6* 1.5* 2.1   GFR: Estimated Creatinine Clearance: 52.5 mL/min (Tracey Hermance) (by C-G formula based on SCr of 1.5 mg/dL (H)). Liver Function Tests: Recent Labs  Lab 01/03/19 2053 01/05/19 0531  AST 17 25  ALT 18 25  ALKPHOS 75 77  BILITOT 0.9 1.0   PROT 5.6* 5.6*  ALBUMIN 2.7* 2.4*   No results for input(s): LIPASE, AMYLASE in the last 168 hours. No results for input(s): AMMONIA in the last 168 hours. Coagulation Profile: Recent Labs  Lab 01/03/19 2053  INR 1.1   Cardiac Enzymes: No results for input(s): CKTOTAL, CKMB, CKMBINDEX, TROPONINI in the last 168 hours. BNP (last 3 results) No results for input(s): PROBNP in the last 8760 hours. HbA1C: No results for input(s): HGBA1C in the last 72 hours. CBG: Recent Labs  Lab 01/04/19 2050 01/05/19 0009 01/05/19 0418 01/05/19 0738 01/05/19 1131  GLUCAP 131* 217* 143* 131* 171*   Lipid Profile: No results for input(s): CHOL, HDL, LDLCALC, TRIG, CHOLHDL, LDLDIRECT in the last 72 hours. Thyroid Function Tests: No results for input(s): TSH, T4TOTAL, FREET4, T3FREE, THYROIDAB in the last 72 hours. Anemia Panel: No results for input(s): VITAMINB12, FOLATE, FERRITIN, TIBC, IRON, RETICCTPCT in the last 72 hours. Sepsis Labs: Recent Labs  Lab 01/03/19 2053 01/03/19 2230  LATICACIDVEN 1.1 1.2    Recent Results (from the past 240 hour(s))  Blood Culture (routine x 2)     Status: None (Preliminary result)   Collection Time: 01/03/19  8:56 PM   Specimen: BLOOD  Result Value Ref Range Status   Specimen Description BLOOD RIGHT ARM  Final   Special Requests   Final    BOTTLES DRAWN AEROBIC AND ANAEROBIC Blood Culture results may not be optimal due to an excessive volume of blood received in culture bottles   Culture   Final    NO GROWTH 2 DAYS Performed at [facility], [place] [address] [place], [place] [address]    Report Status PENDING  Incomplete  Blood Culture (routine x 2)     Status: None (Preliminary result)   Collection Time: 01/03/19  8:58 PM  Specimen: BLOOD  Result Value Ref Range Status   Specimen Description BLOOD RIGHT ARM  Final   Special Requests   Final    BOTTLES DRAWN AEROBIC AND ANAEROBIC Blood Culture results may not be optimal due to an  excessive volume of blood received in culture bottles   Culture   Final    NO GROWTH 2 DAYS Performed at [facility], [place] [address] [place], [place] [address]    Report Status PENDING  Incomplete  Urine culture     Status: Abnormal   Collection Time: 01/03/19  9:24 PM   Specimen: Urine, Clean Catch  Result Value Ref Range Status   Specimen Description URINE, CLEAN CATCH  Final   Special Requests   Final    NONE Performed at [facility], [place] [address], [place], [place] [address]    Culture MULTIPLE SPECIES PRESENT, SUGGEST RECOLLECTION (Jordyn Hofacker)  Final   Report Status 01/05/2019 FINAL  Final  SARS CORONAVIRUS 2 (TAT 6-24 HRS) Nasopharyngeal Nasopharyngeal Swab     Status: None   Collection Time: 01/03/19  9:38 PM   Specimen: Nasopharyngeal Swab  Result Value Ref Range Status   SARS Coronavirus 2 NEGATIVE NEGATIVE Final    Comment: (NOTE) SARS-CoV-2 target nucleic acids are NOT DETECTED. The SARS-CoV-2 RNA is generally detectable in upper and lower respiratory specimens during the acute phase of infection. Negative results do not preclude SARS-CoV-2 infection, do not rule out co-infections with other pathogens, and should not be used as the sole basis for treatment or other patient management decisions. Negative results must be combined with clinical observations, patient history, and epidemiological information. The expected result is Negative. Fact Sheet for Patients: SugarRoll.be Fact Sheet for Healthcare Providers: https://www.woods-mathews.com/ This test is not yet approved or cleared by the [place] FDA and  has been authorized for detection and/or diagnosis of SARS-CoV-2 by FDA under an Emergency Use Authorization (EUA). This EUA will remain  in effect (meaning this test can be used) for the duration of the COVID-19 declaration under Section 56 4(b)(1) of the Act, 21 U.S.C. section 360bbb-3(b)(1), unless the  authorization is terminated or revoked sooner. Performed at [facility], [place] [address] [place], [place] [address]          Radiology Studies: Ct Head Wo Contrast  Result Date: 01/03/2019 CLINICAL DATA:  Altered level of consciousness. EXAM: CT HEAD WITHOUT CONTRAST TECHNIQUE: Contiguous axial images were obtained from the base of the skull through the vertex without intravenous contrast. COMPARISON:  Head CT and brain MRI 11/22/2018 FINDINGS: Brain: Expected contraction of subacute parafalcine right frontal hematoma with developing encephalomalacia. No new or acute hemorrhage. No evidence of acute ischemia. No hydrocephalus. No midline shift. Stable generalized cerebral atrophy and chronic small vessel ischemia. Vascular: Atherosclerosis of skullbase vasculature without hyperdense vessel or abnormal calcification. Skull: No fracture or focal lesion. Sinuses/Orbits: Paranasal sinuses and mastoid air cells are clear. The visualized orbits are unremarkable. Bilateral lens extraction. Other: None. IMPRESSION: 1. No acute intracranial abnormality. 2. Expected contraction of subacute parafalcine right frontal hematoma over the last 6 weeks with encephalomalacia. 3. Stable atrophy and chronic small vessel ischemia. Electronically Signed   By: Keith Rake M.D.   On: 01/03/2019 21:45   Ct Angio Chest Pe W And/or Wo Contrast  Addendum Date: 01/04/2019   ADDENDUM REPORT: 01/04/2019 00:50 ADDENDUM: Dilation of the ascending thoracic aorta to 4.1 cm. Recommend annual imaging followup by CTA or MRA. This recommendation follows  2010 ACCF/AHA/AATS/ACR/ASA/SCA/SCAI/SIR/STS/SVM Guidelines for the Diagnosis and Management of Patients with Thoracic Aortic Disease. Circulation. 2010; [id number]. Aortic aneurysm NOS (ICD10-I71.9) Electronically Signed   By: Lovena Le M.D.   On: 01/04/2019 00:50   Result Date: 01/04/2019 CLINICAL DATA:  Altered mental status, starting mid day, abdominal pain and  distension, PE suspected, positive D-dimer EXAM: CT ANGIOGRAPHY CHEST WITH CONTRAST TECHNIQUE: Multidetector CT imaging of the chest was performed using the standard protocol during bolus administration of intravenous contrast. Multiplanar CT image reconstructions and MIPs were obtained to evaluate the vascular anatomy. CONTRAST:  6mL OMNIPAQUE IOHEXOL 350 MG/ML SOLN COMPARISON:  CT chest 01/31/2015 FINDINGS: Cardiovascular: Satisfactory opacification of pulmonary arteries to the segmental level. Filling defects are present in the pulmonary arteries of the superior segment and posterior basal segment of the right lower lobe. No other visible pulmonary artery filling defects are evident. There is central pulmonary arterial enlargement, increased from comparison study. There is borderline elevation of the RV/LV ratio (0.95) with flattening of the intraventricular septum. Borderline cardiomegaly. No pericardial effusion. Ascending aorta is dilated to 4.1 cm. There is atherosclerotic calcification of the aortic arch and proximal great vessels. Suboptimal contrast opacification of the aorta limits luminal evaluation. Mediastinum/Nodes: No enlarged mediastinal or axillary lymph nodes. Thyroid gland, trachea, and esophagus demonstrate no significant findings. Lungs/Pleura: Lung volumes are diminished with extensive areas of atelectasis throughout both lungs. More subpleural reticulation is noted in the upper lobes with some peribronchial thickening and bronchiectatic changes throughout both lungs. Subpleural cyst noted in the right lower lung. Koray Soter previously seen 5 mm nodule in the left lower lobe is poorly visualized on today's exam due to dependent atelectasis in the lung base. Lobular 12 mm nodule in the lingula associated with Valina Maes bandlike area of scarring may reflect rounded atelectasis, unchanged from prior. Upper Abdomen: No acute abnormalities present in the visualized portions of the upper abdomen. Abdominal aortic  atherosclerosis is evident. Musculoskeletal: No acute osseous abnormality or suspicious osseous lesion. Multilevel degenerative changes are present in the imaged portions of the spine. Review of the MIP images confirms the above findings. IMPRESSION: 1. Positive for acute PE with CT evidence of right heart strain (RV/LV Ratio = 0.95, flattening of the intraventricular septum, central pulmonary artery enlargement) consistent with at least submassive (intermediate risk) PE. The presence of right heart strain has been associated with an increased risk of morbidity and mortality. Please activate Code PE by paging [phone]. 2. Extensive areas of atelectasis likely accentuated by imaging during exhalation. 3. Suspect Naomi Fitton region of rounded atelectasis or scarring within the lingula, stable from comparison exam in 2019. 4.  Aortic Atherosclerosis (ICD10-I70.0).  Coronary atherosclerosis. Critical Value/emergent results were called by telephone at the time of interpretation on 01/04/2019 at 12:05 am to providerCardama MD, who verbally acknowledged these results. Electronically Signed: By: Lovena Le M.D. On: 01/04/2019 00:08   Dg Chest Port 1 View  Result Date: 01/03/2019 CLINICAL DATA:  Altered mental status. EXAM: PORTABLE CHEST 1 VIEW COMPARISON:  Chest radiograph 07/26/2017. There is Dulcie Gammon lesion chest CT 03/17/2018. FINDINGS: Low lung volumes. Cardiomegaly. No obvious pulmonary edema. Left basilar evaluation is limited due to soft tissue attenuation. No evidence of acute airspace disease or pneumothorax. IMPRESSION: 1. Low lung volumes limit assessment, particularly at the left lung base. 2. Cardiomegaly. Electronically Signed   By: Keith Rake M.D.   On: 01/03/2019 21:13   Vas [place] Lower Extremity Venous (dvt)  Result Date: 01/04/2019  Lower Venous Study Indications: Pain,  Swelling, and pulmonary embolism.  Risk Factors: Confirmed PE. Limitations: Body habitus and movement of the legs. Performing  Technologist: Toma Copier RVS  Examination Guidelines: Kamen Hanken complete evaluation includes B-mode imaging, spectral Doppler, color Doppler, and power Doppler as needed of all accessible portions of each vessel. Bilateral testing is considered an integral part of Solita Macadam complete examination. Limited examinations for reoccurring indications may be performed as noted.  +---------+---------------+---------+-----------+----------+--------------+  RIGHT     Compressibility Phasicity Spontaneity Properties Thrombus Aging  +---------+---------------+---------+-----------+----------+--------------+  CFV       Full            Yes       Yes                                    +---------+---------------+---------+-----------+----------+--------------+  SFJ       Full                                                             +---------+---------------+---------+-----------+----------+--------------+  FV Prox   Full            Yes       Yes                                    +---------+---------------+---------+-----------+----------+--------------+  FV Mid    Full                                                             +---------+---------------+---------+-----------+----------+--------------+  FV Distal Full            Yes       Yes                                    +---------+---------------+---------+-----------+----------+--------------+  PFV       Full            Yes       Yes                                    +---------+---------------+---------+-----------+----------+--------------+  POP       Full            Yes       Yes                                    +---------+---------------+---------+-----------+----------+--------------+  PTV       Full                                                             +---------+---------------+---------+-----------+----------+--------------+  PERO      Full                                                              +---------+---------------+---------+-----------+----------+--------------+   +---------+---------------+---------+-----------+----------+--------------+  LEFT      Compressibility Phasicity Spontaneity Properties Thrombus Aging  +---------+---------------+---------+-----------+----------+--------------+  CFV       Full            Yes       Yes                                    +---------+---------------+---------+-----------+----------+--------------+  SFJ       Full                                                             +---------+---------------+---------+-----------+----------+--------------+  FV Prox   Full            Yes       Yes                                    +---------+---------------+---------+-----------+----------+--------------+  FV Mid    Full                                                             +---------+---------------+---------+-----------+----------+--------------+  FV Distal Full            Yes       Yes                                    +---------+---------------+---------+-----------+----------+--------------+  PFV       Full            Yes       Yes                                    +---------+---------------+---------+-----------+----------+--------------+  POP       Full            Yes       Yes                                    +---------+---------------+---------+-----------+----------+--------------+  PTV       Full                                                             +---------+---------------+---------+-----------+----------+--------------+  PERO      Full                                                             +---------+---------------+---------+-----------+----------+--------------+     Summary: Right: There is no evidence of deep vein thrombosis in the lower extremity. No cystic structure found in the popliteal fossa. Left: There is no evidence of deep vein thrombosis in the lower extremity. No cystic structure found in the popliteal fossa.  *See  table(s) above for measurements and observations.    Preliminary         Scheduled Meds:  acetaminophen  1,000 mg Oral Q8H   amiodarone  200 mg Oral Daily   atorvastatin  10 mg Oral QPM   colchicine  0.6 mg Oral Once   [START ON 01/06/2019] colchicine  0.6 mg Oral BID   colchicine  1.2 mg Oral Once   divalproex  500 mg Oral Q12H   insulin aspart  0-9 Units Subcutaneous Q4H   mometasone-formoterol  2 puff Inhalation BID   nicotine  21 mg Transdermal Daily   potassium chloride  40 mEq Oral Q4H   sodium chloride flush  3 mL Intravenous Q12H   sodium chloride flush  3 mL Intravenous Q12H   Continuous Infusions:  sodium chloride     heparin 1,800 Units/hr (01/05/19 1108)   sodium chloride       LOS: 1 day    Time spent: over 30 min     Fayrene Helper, MD [facility] Pager AMION  If 7PM-7AM, please contact night-coverage [email or web address] Password [email or web address] 01/05/2019, 1:39 PM

## 2019-01-05 NOTE — Telephone Encounter (Signed)
Dr. Elease Hashimoto - FYI. Thanks!

## 2019-01-05 NOTE — Telephone Encounter (Signed)
I am aware.  Saw hospital admission notes.

## 2019-01-05 NOTE — Progress Notes (Signed)
Pt was  pulling IV out, pulling heart monitor leads off and wanted to leave. Pt has confusion but easily reoriented. Pt keep on asking me if I have a cigarette. Charge nurse came and spoke with the pt. Will continue tp monitor pt.

## 2019-01-05 NOTE — Telephone Encounter (Signed)
Patient spouse wanted the provider to know that the patient is in [facility].  He went by ambulance Saturday night because of swelling in his feet.

## 2019-01-05 NOTE — Progress Notes (Signed)
ANTICOAGULATION CONSULT NOTE   Pharmacy Consult for Heparin  Indication: pulmonary embolus  No Known Allergies  Patient Measurements: Height: 5\' 11"  (180.3 cm) Weight: 217 lb 14.4 oz (98.8 kg) IBW/kg (Calculated) : 75.3  Vital Signs: Temp: 99.7 F (37.6 C) (09/28 0540) Temp Source: Oral (09/28 0540) BP: 125/67 (09/28 0540) Pulse Rate: 81 (09/28 0540)  Labs: Recent Labs    01/03/19 2053 01/03/19 2204 01/04/19 0305 01/04/19 0457 01/04/19 0921 01/04/19 1835 01/05/19 0531  HGB 11.3* 11.9*  --  10.8*  --   --  11.0*  HCT 34.4* 35.0*  --  32.7*  --   --  32.4*  PLT 243  --   --  237  --   --  247  APTT 29  --   --   --   --   --   --   LABPROT 13.7  --   --   --   --   --   --   INR 1.1  --   --   --   --   --   --   HEPARINUNFRC  --   --   --   --  <0.10* 0.12* 0.26*  CREATININE 1.67*  --   --  1.55*  --   --   --   TROPONINIHS  --   --  10  --   --   --   --     Estimated Creatinine Clearance: 50.9 mL/min (A) (by C-G formula based on SCr of 1.55 mg/dL (H)).   Medical History: Past Medical History:  Diagnosis Date  . Allergic rhinitis   . Anticoagulant long-term use    eliquis  . Cardiomyopathy due to systemic disease [facility])    followed by dr harding  . COPD with emphysema [facility])    pulmologist-  dr Halford Chessman  . Dyspnea    occasional per pt  . History of colon polyps    tubular adenoma 2013  . History of gout    09-05-2017 last flare-up  05/ 2019 3 wks ago, feet  . History of sepsis 02/18/2017   per d/c note probable uti, acute chf, acute renal failure, hypoxia  . Hyperlipidemia   . Hyperplasia of prostate with lower urinary tract symptoms (LUTS)   . Hypertension   . OSA on CPAP    per study 08-03-2004  Severe OSA  . Persistent atrial fibrillation    cardiologist --  dr Dorris Carnes--  post cardioversion 05-07-2014  . Prostate cancer [facility]) urologsit-  dr Alyson Ingles--- as of 05-21-2017 per pt last PSA 11 approx.   Dx  2014--  stage T1c, Gleason 3+3=6, PSA 6.67--   Active surveillance/  04/ 2019  Stage T1b, Gleason 3+4, PSA 12.8- plan external radiation therapy    . 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 ([place])    pulmologist-  dr Halford Chessman  . Seasonal allergies   . Sigmoid diverticulosis   . Systolic and diastolic CHF, chronic [facility])    cardiologist-  [name]  . Tinea versicolor   . Type 2 diabetes mellitus ([place])   . Wears hearing aid in both ears     Assessment: 73 y/o M with new onset PE to begin heparin. Hgb 11.9. PTA meds reviewed.   Pt noted to have a recent [place] in July 2020 while on Apixaban, CT head today with no acute bleeding  [name] discussed with Neuro (Dr. Rory Percy), will proceed with heparin with no bolus and lower  goal  9/28 AM update:  Heparin level just below goal  No issues per RN  Goal of Therapy:  Heparin level 0.3-0.5 units/mL, recent ICH in July 2020 Monitor platelets by anticoagulation protocol: Yes   Plan:  No boluses Inc heparin to 1800 units/hr Re-check heparin level in 8 hours Daily CBC/HL Monitor for bleeding  Narda Bonds, PharmD, BCPS Clinical Pharmacist Phone: [phone]

## 2019-01-06 ENCOUNTER — Ambulatory Visit: Payer: Medicare Other | Admitting: Family Medicine

## 2019-01-06 ENCOUNTER — Inpatient Hospital Stay (HOSPITAL_COMMUNITY): Payer: Medicare Other

## 2019-01-06 LAB — GLUCOSE, CAPILLARY
Glucose-Capillary: 132 mg/dL — ABNORMAL HIGH (ref 70–99)
Glucose-Capillary: 174 mg/dL — ABNORMAL HIGH (ref 70–99)
Glucose-Capillary: 171 mg/dL — ABNORMAL HIGH (ref 70–99)
Glucose-Capillary: 139 mg/dL — ABNORMAL HIGH (ref 70–99)

## 2019-01-06 LAB — HEMOGLOBIN AND HEMATOCRIT, BLOOD
HCT: 28.8 % — ABNORMAL LOW (ref 39.0–52.0)
Hemoglobin: 9.5 g/dL — ABNORMAL LOW (ref 13.0–17.0)

## 2019-01-06 LAB — COMPREHENSIVE METABOLIC PANEL
ALT: 18 U/L (ref 0–44)
AST: 16 U/L (ref 15–41)
Albumin: 2 g/dL — ABNORMAL LOW (ref 3.5–5.0)
Alkaline Phosphatase: 83 U/L (ref 38–126)
Anion gap: 9 (ref 5–15)
BUN: 14 mg/dL (ref 8–23)
CO2: 25 mmol/L (ref 22–32)
Calcium: 8.1 mg/dL — ABNORMAL LOW (ref 8.9–10.3)
Chloride: 101 mmol/L (ref 98–111)
Creatinine, Ser: 1.27 mg/dL — ABNORMAL HIGH (ref 0.61–1.24)
GFR calc Af Amer: >60 mL/min (ref >60)
GFR calc non Af Amer: 56 mL/min — ABNORMAL LOW (ref >60)
Glucose, Bld: 146 mg/dL — ABNORMAL HIGH (ref 70–99)
Potassium: 3.5 mmol/L (ref 3.5–5.1)
Sodium: 135 mmol/L (ref 135–145)
Total Bilirubin: 0.8 mg/dL (ref 0.3–1.2)
Total Protein: 4.9 g/dL — ABNORMAL LOW (ref 6.5–8.1)

## 2019-01-06 LAB — CBC
HCT: 29.3 % — ABNORMAL LOW (ref 39.0–52.0)
Hemoglobin: 9.8 g/dL — ABNORMAL LOW (ref 13.0–17.0)
MCH: 32.6 pg (ref 26.0–34.0)
MCHC: 33.4 g/dL (ref 30.0–36.0)
MCV: 97.3 fL (ref 80.0–100.0)
Platelets: 266 10*3/uL (ref 150–400)
RBC: 3.01 MIL/uL — ABNORMAL LOW (ref 4.22–5.81)
RDW: 13.2 % (ref 11.5–15.5)
WBC: 5.9 10*3/uL (ref 4.0–10.5)
nRBC: 0 % (ref 0.0–0.2)

## 2019-01-06 LAB — HEPARIN LEVEL (UNFRACTIONATED)
Heparin Unfractionated: 0.15 IU/mL — ABNORMAL LOW (ref 0.30–0.70)
Heparin Unfractionated: 0.16 IU/mL — ABNORMAL LOW (ref 0.30–0.70)

## 2019-01-06 LAB — MAGNESIUM: Magnesium: 1.8 mg/dL (ref 1.7–2.4)

## 2019-01-06 MED ORDER — GUAIFENESIN-DM 100-10 MG/5ML PO SYRP: 5.0000 mL | ORAL_SOLUTION | ORAL | Status: DC | PRN

## 2019-01-06 NOTE — Progress Notes (Signed)
ANTICOAGULATION CONSULT NOTE   Pharmacy Consult for Heparin  Indication: pulmonary embolus  No Known Allergies  Patient Measurements: Height: 5\' 11"  (180.3 cm) Weight: 216 lb 7.9 oz (98.2 kg) IBW/kg (Calculated) : 75.3  Vital Signs: Temp: 98.4 F (36.9 C) (09/29 1500) Temp Source: Oral (09/29 1500) BP: 121/73 (09/29 1500) Pulse Rate: 76 (09/29 1500)  Labs: Recent Labs    01/03/19 2053  01/04/19 0305 01/04/19 0457  01/05/19 0531  01/05/19 2251 01/06/19 0617 01/06/19 1434 01/06/19 1651  HGB 11.3*   < >  --  10.8*  --  11.0*  --   --  9.8* 9.5*  --   HCT 34.4*   < >  --  32.7*  --  32.4*  --   --  29.3* 28.8*  --   PLT 243  --   --  237  --  247  --   --  266  --   --   APTT 29  --   --   --   --   --   --   --   --   --   --   LABPROT 13.7  --   --   --   --   --   --   --   --   --   --   INR 1.1  --   --   --   --   --   --   --   --   --   --   HEPARINUNFRC  --   --   --   --    < > 0.26*   < > <0.10* 0.15*  --  0.16*  CREATININE 1.67*  --   --  1.55*  --  1.50*  --   --  1.27*  --   --   TROPONINIHS  --   --  10  --   --   --   --   --   --   --   --    < > = values in this interval not displayed.    Estimated Creatinine Clearance: 61.9 mL/min (A) (by C-G formula based on SCr of 1.27 mg/dL (H)).   Medical History: Past Medical History:  Diagnosis Date  . Allergic rhinitis   . Anticoagulant long-term use    eliquis  . Cardiomyopathy due to systemic disease [facility])    followed by dr harding  . COPD with emphysema [facility])    pulmologist-  dr Halford Chessman  . Dyspnea    occasional per pt  . History of colon polyps    tubular adenoma 2013  . History of gout    09-05-2017 last flare-up  05/ 2019 3 wks ago, feet  . History of sepsis 02/18/2017   per d/c note probable uti, acute chf, acute renal failure, hypoxia  . Hyperlipidemia   . Hyperplasia of prostate with lower urinary tract symptoms (LUTS)   . Hypertension   . OSA on CPAP    per study 08-03-2004  Severe OSA  .  Persistent atrial fibrillation    cardiologist --  dr Dorris Carnes--  post cardioversion 05-07-2014  . Prostate cancer [facility]) urologsit-  dr Alyson Ingles--- as of 05-21-2017 per pt last PSA 11 approx.   Dx  2014--  stage T1c, Gleason 3+3=6, PSA 6.67--  Active surveillance/  04/ 2019  Stage T1b, Gleason 3+4, PSA 12.8- plan external radiation therapy    . Respiratory bronchiolitis associated  interstitial lung disease ([place])    pulmologist-  dr Halford Chessman  . Seasonal allergies   . Sigmoid diverticulosis   . Systolic and diastolic CHF, chronic [facility])    cardiologist-  [name]  . Tinea versicolor   . Type 2 diabetes mellitus ([place])   . Wears hearing aid in both ears     Assessment: 73 y/o M with new onset PE to begin heparin. Hgb 11.9. PTA meds reviewed.   Pt noted to have a recent [place] in July 2020 while on Apixaban, CT head today with no acute bleeding noted.   [name] discussed with Neuro (Dr. Rory Percy), will proceed with heparin with no bolus and lower goal.  Heparin level still low 0.16 after increased drip rate heparin 2150 uts/hr.  Confirmed with RN that IV running ok, no infiltrate or swollen arm.  No bleeding issues noted.   Goal of Therapy:  Heparin level 0.3-0.5 units/mL, recent ICH in July 2020 Monitor platelets by anticoagulation protocol: Yes   Plan:  No boluses Inc heparin to 2300 units/hr Daily CBC/HL Monitor for bleeding  Bonnita Nasuti Pharm.D. CPP, BCPS Clinical Pharmacist [phone] 01/06/2019 7:18 PM

## 2019-01-06 NOTE — Progress Notes (Signed)
PROGRESS NOTE    KENYEN CERON  [id number] DOB: 05-14-1945 DOA: 01/03/2019 PCP: [name], MD   Brief Narrative:  Ronald [name] is Dawood Spitler 73 y.o. male with medical history significant for paroxysmal atrial fibrillation no longer on Eliquis after ICH in July 2020, COPD, OSA on CPAP, chronic diastolic CHF, type 2 diabetes mellitus, and seizure disorder after his ICH, now presenting to the emergency department with lower leg pain and confusion.  Patient is accompanied by his wife who assists with the history.  He has been doing well back at home, ambulating on his own with no appreciable residual weakness from his stroke, but has been unable to ambulate since 01/01/2019 due to bilateral lower leg pain that is much worse on the left.  He was also having bilateral leg swelling that improved after increasing his Lasix.  His cognitive function remained stable until the afternoon of 01/03/2019 when the patient seemed to be confused.  He has had some transient episodes of confusion since the [place] in July.  EMS was called, patient was found to be saturating in the mid 80s on room air, he was placed on supplemental oxygen, and brought into the ED.  He denies any chest pain or palpitations.  Denies any melena or hematochezia.  Denies headache or acute change in his vision or hearing, or focal numbness or weakness.  ED Course: Upon arrival to the ED, patient is found to be afebrile, saturating 89% on 3 L/min of supplemental oxygen, and with blood pressure 98/58.  EKG features Alassane Kalafut sinus rhythm and chest x-ray is notable for cardiomegaly with low lung volumes.  Noncontrast head CT is negative for acute findings but the expected contraction of right frontal hematoma is noted with encephalomalacia.  Chemistry panel features Jamari Diana sodium of 133, potassium 3.3, and creatinine 1.67, up from 1.25 earlier this month.  CBC features Afton Lavalle mild normocytic anemia.  CTA chest reveals acute PE involving the right lower lobe with  evidence for heart strain.  COVID-19 testing is in process.  He was admitted with Kynzee Devinney submassive PE and acute encephalopathy and LE pain.  He's been on heparin gtt since admission.  He has Indiya Izquierdo hx of ICH and case was discussed with neurology prior to anticoagulation.  Plan to continue anticoagulation and obtain head CT prior to transition to oral meds.   Assessment & Plan:   Principal Problem:   Acute pulmonary embolism (HCC) Active Problems:   Type 2 diabetes mellitus with hyperglycemia (HCC)   Paroxysmal atrial fibrillation (HCC)   Seizures ([place]), secondsry to ICH   Acute encephalopathy   CKD (chronic kidney disease), stage III (HCC)   Acute respiratory failure with hypoxia ([place])   1. Submassive PE   Acute Hypoxic Resp Failure  - Presents with 3 days of bilateral lower leg pain, mainly on left, Pearley Millington few hours of confusion, and was found to be hypoxic with low-normal BP  - CTA reveals acute PE in RLL with evidence for right heart strain  - PESI class V (male, COPD, confusion, and hypoxia) - 173, very high risk - he's hemodynamically stable with mild oxygen requirement - on 2 L [place] today - He is not having chest pain and BP has remained stable - his prior intracranial hemorrhage is contraindication for lytics - BNP is elevated, but lower than prior - Discussed treatment with neurology in light of [place] on 10/21/18 -> recommended repeat head CT with follow up head CT in 24-48 hours prior to  switching to oral anticoagulation. - Continue heparin gtt - will plan for 72 hrs given high risk and then transition to oral anticoagulation after repeat head CT - he's not therapeutic on heparin yet, would like at least 24 hrs therapeutic prior to CT and transition to oral meds - echo with normal EF - normal RV systolic function (see report) - prelim LE [place] without DVT - BNP elevated, high sensitivity troponin wnl - Consider discussion with hematology consultation given ICH in July regarding which oral  anticoagulant  - ABG obtained due to hypoxia and confusion, no evidence of hypercarbia - titrate O2 for sat of 90-95.  # Fever   Cough: temp to 100.4 on 9/28.  Likely 2/2 above.  Follow CXR (without acute findings). Urine cx with multiple species Blood cx NG  2. Bilateral leg pain and swelling   Concern for Gout Flare - Patient unable to ambulate since 9/24 due to bilateral lower leg pain, worse on left  - There is swelling and tenderness noted, no evidence for traumatic or infectious etiology  - Check venous US for DVT (negative as above) - start colchicine for concern for gout flare - symptoms improved today - follow uric acid level (5.8)  3. History of ICH  - Patient had [place] in July 2020 while on Eliquis for PAF, has been back home now   - Head CT in ED with no acute findings, expected contraction of right frontal hematoma with encephalomalacia  - Monitor closely while starting anticoagulation   4. Paroxysmal atrial fibrillation  - In sinus rhythm on admission  - CHADS-VASc is 24 (age, CVA x2, CHF, DM) - He had stopped Eliquis after ICH in July 2020, now on IV heparin as above  - Continue amiodarone   5. CKD stage III  - SCr is 1.67, up from 1.25 two weeks earlier after increasing his Lasix  - BP is low-normal and there is no pulmonary edema, Ronesha Heenan small fluid bolus is being given in ED  - Renally-dose medications, hold Lasix and losartan initially, monitor   - 1.27 today - continue to follow   6. Chronic diastolic CHF  - Bilateral LE edema noted but reportedly improved after recent increase in Lasix; there is no pulm edema on CTA  - EF was preserved in July 2020  - He has acute PE  - continue to hold BB, losartan, and lasix - Follow I/O, dasily weights  7. Type II DM  - A1c was 6.9% in July 2020  - Managed with metformin and glimepiride at home, held on admission  - Use low-intensity SSI with Novolog while inpatient    8. Seizures  - Developed after ICH, no recent  seizure - Continue valproate   9. Acute encephalopathy  - Patient presents with confusion and bilateral lower leg pain and swelling  - No acute findings on head CT  - Seems to be imrpoving  - delirium precautions - will resume home seroquel with improvement  Hypokalemia   Hypomagnesemia: replace and follow  DVT prophylaxis: heparin  Code Status: full  Family Communication: wife at bedside Disposition Plan: inpatient given submassive PE   Consultants:   Neuro curb side  Procedures:  Pending echo and LE [place]  Antimicrobials:  Anti-infectives (From admission, onward)   None     Subjective: Notes gout pain is better Otherwise doing ok   Objective: Vitals:   01/05/19 2322 01/06/19 0620 01/06/19 0814 01/06/19 0816  BP:  114/60  120/73  Pulse:  80  78  Resp:  18  19  Temp: 99.4 F (37.4 C) 98 F (36.7 C)  99 F (37.2 C)  TempSrc: Oral Oral  Oral  SpO2:  99% 93% 93%  Weight:  98.2 kg    Height:        Intake/Output Summary (Last 24 hours) at 01/06/2019 1409 Last data filed at 01/06/2019 1024 Gross per 24 hour  Intake 963 ml  Output 1325 ml  Net -362 ml   Filed Weights   01/03/19 2039 01/05/19 0540 01/06/19 0620  Weight: 98.9 kg 98.8 kg 98.2 kg    Examination:  General: No acute distress. Cardiovascular: Heart sounds show Lola Lofaro regular rate, and rhythm. Lungs: Clear to auscultation bilaterally  Abdomen: Soft, nontender, nondistended  Neurological: Alert and oriented 3. Moves all extremities 4. Cranial nerves II through XII grossly intact. Skin: Warm and dry. No rashes or lesions. Extremities: bilateral LE edema.  TTP improved      Data Reviewed: I have personally reviewed following labs and imaging studies  CBC: Recent Labs  Lab 01/03/19 2053 01/03/19 2204 01/04/19 0457 01/05/19 0531 01/06/19 0617  WBC 6.0  --  5.4 6.0 5.9  NEUTROABS 4.1  --   --   --   --   HGB 11.3* 11.9* 10.8* 11.0* 9.8*  HCT 34.4* 35.0* 32.7* 32.4* 29.3*  MCV 99.7  --   97.9 97.0 97.3  PLT 243  --  237 247 [id number]   Basic Metabolic Panel: Recent Labs  Lab 01/03/19 2053 01/03/19 2204 01/04/19 0305 01/04/19 0457 01/05/19 0531 01/06/19 0617  NA 133* 135  --  134* 136 135  K 3.3* 3.0*  --  3.0* 3.2* 3.5  CL 93*  --   --  96* 99 101  CO2 27  --   --  27 27 25   GLUCOSE 169*  --   --  131* 135* 146*  BUN 20  --   --  19 18 14   CREATININE 1.67*  --   --  1.55* 1.50* 1.27*  CALCIUM 8.2*  --   --  7.9* 8.5* 8.1*  MG  --   --  1.6* 1.5* 2.1 1.8   GFR: Estimated Creatinine Clearance: 61.9 mL/min (Macario Shear) (by C-G formula based on SCr of 1.27 mg/dL (H)). Liver Function Tests: Recent Labs  Lab 01/03/19 2053 01/05/19 0531 01/06/19 0617  AST 17 25 16   ALT 18 25 18   ALKPHOS 75 77 83  BILITOT 0.9 1.0 0.8  PROT 5.6* 5.6* 4.9*  ALBUMIN 2.7* 2.4* 2.0*   No results for input(s): LIPASE, AMYLASE in the last 168 hours. No results for input(s): AMMONIA in the last 168 hours. Coagulation Profile: Recent Labs  Lab 01/03/19 2053  INR 1.1   Cardiac Enzymes: No results for input(s): CKTOTAL, CKMB, CKMBINDEX, TROPONINI in the last 168 hours. BNP (last 3 results) No results for input(s): PROBNP in the last 8760 hours. HbA1C: No results for input(s): HGBA1C in the last 72 hours. CBG: Recent Labs  Lab 01/05/19 1131 01/05/19 1635 01/05/19 2132 01/06/19 0801 01/06/19 1123  GLUCAP 171* 151* 169* 132* 174*   Lipid Profile: No results for input(s): CHOL, HDL, LDLCALC, TRIG, CHOLHDL, LDLDIRECT in the last 72 hours. Thyroid Function Tests: No results for input(s): TSH, T4TOTAL, FREET4, T3FREE, THYROIDAB in the last 72 hours. Anemia Panel: No results for input(s): VITAMINB12, FOLATE, FERRITIN, TIBC, IRON, RETICCTPCT in the last 72 hours. Sepsis Labs: Recent Labs  Lab 01/03/19 2053 01/03/19 2230  LATICACIDVEN 1.1 1.2    Recent Results (from the past 240 hour(s))  Blood Culture (routine x 2)     Status: None (Preliminary result)   Collection Time: 01/03/19   8:56 PM   Specimen: BLOOD  Result Value Ref Range Status   Specimen Description BLOOD RIGHT ARM  Final   Special Requests   Final    BOTTLES DRAWN AEROBIC AND ANAEROBIC Blood Culture results may not be optimal due to an excessive volume of blood received in culture bottles   Culture   Final    NO GROWTH 3 DAYS Performed at [facility], [place] [address], [place], [place] [address]    Report Status PENDING  Incomplete  Blood Culture (routine x 2)     Status: None (Preliminary result)   Collection Time: 01/03/19  8:58 PM   Specimen: BLOOD  Result Value Ref Range Status   Specimen Description BLOOD RIGHT ARM  Final   Special Requests   Final    BOTTLES DRAWN AEROBIC AND ANAEROBIC Blood Culture results may not be optimal due to an excessive volume of blood received in culture bottles   Culture   Final    NO GROWTH 3 DAYS Performed at [facility], [place] [address], [place], [place] [address]    Report Status PENDING  Incomplete  Urine culture     Status: Abnormal   Collection Time: 01/03/19  9:24 PM   Specimen: Urine, Clean Catch  Result Value Ref Range Status   Specimen Description URINE, CLEAN CATCH  Final   Special Requests   Final    NONE Performed at [facility], [place] [address], [place], [place] [address]    Culture MULTIPLE SPECIES PRESENT, SUGGEST RECOLLECTION (Jaydon Avina)  Final   Report Status 01/05/2019 FINAL  Final  SARS CORONAVIRUS 2 (TAT 6-24 HRS) Nasopharyngeal Nasopharyngeal Swab     Status: None   Collection Time: 01/03/19  9:38 PM   Specimen: Nasopharyngeal Swab  Result Value Ref Range Status   SARS Coronavirus 2 NEGATIVE NEGATIVE Final    Comment: (NOTE) SARS-CoV-2 target nucleic acids are NOT DETECTED. The SARS-CoV-2 RNA is generally detectable in upper and lower respiratory specimens during the acute phase of infection. Negative results do not preclude SARS-CoV-2 infection, do not rule out co-infections with other pathogens, and should not be used as  the sole basis for treatment or other patient management decisions. Negative results must be combined with clinical observations, patient history, and epidemiological information. The expected result is Negative. Fact Sheet for Patients: SugarRoll.be Fact Sheet for Healthcare Providers: https://www.woods-mathews.com/ This test is not yet approved or cleared by the [place] FDA and  has been authorized for detection and/or diagnosis of SARS-CoV-2 by FDA under an Emergency Use Authorization (EUA). This EUA will remain  in effect (meaning this test can be used) for the duration of the COVID-19 declaration under Section 56 4(b)(1) of the Act, 21 U.S.C. section 360bbb-3(b)(1), unless the authorization is terminated or revoked sooner. Performed at [facility], [place] [address] [place] [address], [place],  [address]          Radiology Studies: Dg Chest [facility] 1 View  Result Date: 01/06/2019 CLINICAL DATA:  Fevers, known pulmonary emboli EXAM: PORTABLE CHEST 1 VIEW COMPARISON:  01/03/2019 FINDINGS: Cardiac shadow is mildly prominent. Lungs are well aerated bilaterally. No focal infiltrate or sizable effusion is seen. The overall appearance is stable from the prior exam. IMPRESSION: No acute abnormality noted. Electronically Signed  By: Inez Catalina M.D.   On: 01/06/2019 09:53   Vas [place] Lower Extremity Venous (dvt)  Result Date: 01/05/2019  Lower Venous Study Indications: Pain, Swelling, and pulmonary embolism.  Risk Factors: Confirmed PE. Limitations: Body habitus and movement of the legs. Performing Technologist: Toma Copier RVS  Examination Guidelines: Ernisha Sorn complete evaluation includes B-mode imaging, spectral Doppler, color Doppler, and power Doppler as needed of all accessible portions of each vessel. Bilateral testing is considered an integral part of Layson Bertsch complete examination. Limited examinations for reoccurring indications may be performed as  noted.  +---------+---------------+---------+-----------+----------+--------------+  RIGHT     Compressibility Phasicity Spontaneity Properties Thrombus Aging  +---------+---------------+---------+-----------+----------+--------------+  CFV       Full            Yes       Yes                                    +---------+---------------+---------+-----------+----------+--------------+  SFJ       Full                                                             +---------+---------------+---------+-----------+----------+--------------+  FV Prox   Full            Yes       Yes                                    +---------+---------------+---------+-----------+----------+--------------+  FV Mid    Full                                                             +---------+---------------+---------+-----------+----------+--------------+  FV Distal Full            Yes       Yes                                    +---------+---------------+---------+-----------+----------+--------------+  PFV       Full            Yes       Yes                                    +---------+---------------+---------+-----------+----------+--------------+  POP       Full            Yes       Yes                                    +---------+---------------+---------+-----------+----------+--------------+  PTV       Full                                                             +---------+---------------+---------+-----------+----------+--------------+  PERO      Full                                                             +---------+---------------+---------+-----------+----------+--------------+   +---------+---------------+---------+-----------+----------+--------------+  LEFT      Compressibility Phasicity Spontaneity Properties Thrombus Aging  +---------+---------------+---------+-----------+----------+--------------+  CFV       Full            Yes       Yes                                     +---------+---------------+---------+-----------+----------+--------------+  SFJ       Full                                                             +---------+---------------+---------+-----------+----------+--------------+  FV Prox   Full            Yes       Yes                                    +---------+---------------+---------+-----------+----------+--------------+  FV Mid    Full                                                             +---------+---------------+---------+-----------+----------+--------------+  FV Distal Full            Yes       Yes                                    +---------+---------------+---------+-----------+----------+--------------+  PFV       Full            Yes       Yes                                    +---------+---------------+---------+-----------+----------+--------------+  POP       Full            Yes       Yes                                    +---------+---------------+---------+-----------+----------+--------------+  PTV       Full                                                             +---------+---------------+---------+-----------+----------+--------------+  PERO      Full                                                             +---------+---------------+---------+-----------+----------+--------------+     Summary: Right: There is no evidence of deep vein thrombosis in the lower extremity. No cystic structure found in the popliteal fossa. Left: There is no evidence of deep vein thrombosis in the lower extremity. No cystic structure found in the popliteal fossa.  *See table(s) above for measurements and observations. Electronically signed by Ruta Hinds MD on 01/05/2019 at 4:10:46 PM.    Final         Scheduled Meds:  acetaminophen  1,000 mg Oral Q8H   amiodarone  200 mg Oral Daily   atorvastatin  10 mg Oral QPM   colchicine  0.6 mg Oral BID   divalproex  500 mg Oral Q12H   insulin aspart  0-9 Units Subcutaneous TID WC    mometasone-formoterol  2 puff Inhalation BID   nicotine  21 mg Transdermal Daily   QUEtiapine  25 mg Oral QHS   sodium chloride flush  3 mL Intravenous Q12H   sodium chloride flush  3 mL Intravenous Q12H   Continuous Infusions:  sodium chloride     heparin 2,150 Units/hr (01/06/19 1303)   sodium chloride       LOS: 2 days    Time spent: over 30 min     Fayrene Helper, MD [facility] Pager AMION  If 7PM-7AM, please contact night-coverage [email or web address] Password [email or web address] 01/06/2019, 2:09 PM

## 2019-01-06 NOTE — Progress Notes (Signed)
Patient stated he did not want to go on CPAP at this time. RT instructed patient to have RN call RT if he changes his mind. RT will monitor as needed.

## 2019-01-06 NOTE — Progress Notes (Signed)
ANTICOAGULATION CONSULT NOTE   Pharmacy Consult for Heparin  Indication: pulmonary embolus  No Known Allergies  Patient Measurements: Height: 5\' 11"  (180.3 cm) Weight: 217 lb 14.4 oz (98.8 kg) IBW/kg (Calculated) : 75.3  Vital Signs: Temp: 98 F (36.7 C) (09/29 0620) Temp Source: Oral (09/29 0620) BP: 114/60 (09/29 0620) Pulse Rate: 80 (09/29 0620)  Labs: Recent Labs    01/03/19 2053  01/04/19 0305 01/04/19 0457  01/05/19 0531 01/05/19 1252 01/05/19 2251 01/06/19 0617  HGB 11.3*   < >  --  10.8*  --  11.0*  --   --  9.8*  HCT 34.4*   < >  --  32.7*  --  32.4*  --   --  29.3*  PLT 243  --   --  237  --  247  --   --  266  APTT 29  --   --   --   --   --   --   --   --   LABPROT 13.7  --   --   --   --   --   --   --   --   INR 1.1  --   --   --   --   --   --   --   --   HEPARINUNFRC  --   --   --   --    < > 0.26* 0.17* <0.10* 0.15*  CREATININE 1.67*  --   --  1.55*  --  1.50*  --   --   --   TROPONINIHS  --   --  10  --   --   --   --   --   --    < > = values in this interval not displayed.    Estimated Creatinine Clearance: 52.5 mL/min (A) (by C-G formula based on SCr of 1.5 mg/dL (H)).   Medical History: Past Medical History:  Diagnosis Date  . Allergic rhinitis   . Anticoagulant long-term use    eliquis  . Cardiomyopathy due to systemic disease [facility])    followed by dr harding  . COPD with emphysema [facility])    pulmologist-  dr Halford Chessman  . Dyspnea    occasional per pt  . History of colon polyps    tubular adenoma 2013  . History of gout    09-05-2017 last flare-up  05/ 2019 3 wks ago, feet  . History of sepsis 02/18/2017   per d/c note probable uti, acute chf, acute renal failure, hypoxia  . Hyperlipidemia   . Hyperplasia of prostate with lower urinary tract symptoms (LUTS)   . Hypertension   . OSA on CPAP    per study 08-03-2004  Severe OSA  . Persistent atrial fibrillation    cardiologist --  dr Dorris Carnes--  post cardioversion 05-07-2014  .  Prostate cancer [facility]) urologsit-  dr Alyson Ingles--- as of 05-21-2017 per pt last PSA 11 approx.   Dx  2014--  stage T1c, Gleason 3+3=6, PSA 6.67--  Active surveillance/  04/ 2019  Stage T1b, Gleason 3+4, PSA 12.8- plan external radiation therapy    . 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 ([place])    pulmologist-  dr Halford Chessman  . Seasonal allergies   . Sigmoid diverticulosis   . Systolic and diastolic CHF, chronic [facility])    cardiologist-  [name]  . Tinea versicolor   . Type 2 diabetes mellitus ([place])   . Wears  hearing aid in both ears     Assessment: 73 y/o M with new onset PE to begin heparin. Hgb 11.9. PTA meds reviewed.   Pt noted to have a recent [place] in July 2020 while on Apixaban, CT head today with no acute bleeding  [name] discussed with Neuro (Dr. Rory Percy), will proceed with heparin with no bolus and lower goal  9/29 AM update:  Heparin level just below goal  No issues per RN  Goal of Therapy:  Heparin level 0.3-0.5 units/mL, recent ICH in July 2020 Monitor platelets by anticoagulation protocol: Yes   Plan:  No boluses Inc heparin to 2150 units/hr Re-check heparin level in 8 hours Daily CBC/HL Monitor for bleeding  Narda Bonds, PharmD, BCPS Clinical Pharmacist Phone: [phone]

## 2019-01-06 NOTE — Plan of Care (Signed)
  Problem: Elimination: °Goal: Will not experience complications related to bowel motility °Outcome: Completed/Met °  °Problem: Pain Managment: °Goal: General experience of comfort will improve °Outcome: Completed/Met °  °Problem: Safety: °Goal: Ability to remain free from injury will improve °Outcome: Completed/Met °  °Problem: Skin Integrity: °Goal: Risk for impaired skin integrity will decrease °Outcome: Completed/Met °  °

## 2019-01-07 LAB — HEPARIN LEVEL (UNFRACTIONATED)
Heparin Unfractionated: 0.32 IU/mL (ref 0.30–0.70)
Heparin Unfractionated: 0.17 IU/mL — ABNORMAL LOW (ref 0.30–0.70)

## 2019-01-07 LAB — CBC
HCT: 30 % — ABNORMAL LOW (ref 39.0–52.0)
Hemoglobin: 9.6 g/dL — ABNORMAL LOW (ref 13.0–17.0)
MCH: 31.7 pg (ref 26.0–34.0)
MCHC: 32 g/dL (ref 30.0–36.0)
MCV: 99 fL (ref 80.0–100.0)
Platelets: 290 10*3/uL (ref 150–400)
RBC: 3.03 MIL/uL — ABNORMAL LOW (ref 4.22–5.81)
RDW: 13.3 % (ref 11.5–15.5)
WBC: 5.8 10*3/uL (ref 4.0–10.5)
nRBC: 0 % (ref 0.0–0.2)

## 2019-01-07 LAB — MAGNESIUM: Magnesium: 1.8 mg/dL (ref 1.7–2.4)

## 2019-01-07 LAB — COMPREHENSIVE METABOLIC PANEL
ALT: 27 U/L (ref 0–44)
AST: 33 U/L (ref 15–41)
Albumin: 2 g/dL — ABNORMAL LOW (ref 3.5–5.0)
Alkaline Phosphatase: 89 U/L (ref 38–126)
Anion gap: 10 (ref 5–15)
BUN: 10 mg/dL (ref 8–23)
CO2: 25 mmol/L (ref 22–32)
Calcium: 8.2 mg/dL — ABNORMAL LOW (ref 8.9–10.3)
Chloride: 103 mmol/L (ref 98–111)
Creatinine, Ser: 1.25 mg/dL — ABNORMAL HIGH (ref 0.61–1.24)
GFR calc Af Amer: >60 mL/min (ref >60)
GFR calc non Af Amer: 57 mL/min — ABNORMAL LOW (ref >60)
Glucose, Bld: 144 mg/dL — ABNORMAL HIGH (ref 70–99)
Potassium: 3.3 mmol/L — ABNORMAL LOW (ref 3.5–5.1)
Sodium: 138 mmol/L (ref 135–145)
Total Bilirubin: 0.5 mg/dL (ref 0.3–1.2)
Total Protein: 5 g/dL — ABNORMAL LOW (ref 6.5–8.1)

## 2019-01-07 LAB — GLUCOSE, CAPILLARY
Glucose-Capillary: 124 mg/dL — ABNORMAL HIGH (ref 70–99)
Glucose-Capillary: 159 mg/dL — ABNORMAL HIGH (ref 70–99)
Glucose-Capillary: 156 mg/dL — ABNORMAL HIGH (ref 70–99)
Glucose-Capillary: 157 mg/dL — ABNORMAL HIGH (ref 70–99)

## 2019-01-07 MED ORDER — POTASSIUM CHLORIDE 20 MEQ PO PACK
40.0000 meq | PACK | Freq: Two times a day (BID) | ORAL | Status: DC
  Administered 2019-01-07 – 2019-01-09 (×5): 40 meq via ORAL
  Filled 2019-01-07 (×5): qty 2

## 2019-01-07 NOTE — Progress Notes (Signed)
Patient refused CPAP for tonight. No respiratory distress noted. RT instructed patient to have RT called if he changes his mind. RT will monitor as needed.

## 2019-01-07 NOTE — Progress Notes (Signed)
ANTICOAGULATION CONSULT NOTE   Pharmacy Consult for Heparin  Indication: pulmonary embolus  No Known Allergies  Patient Measurements: Height: 5\' 11"  (180.3 cm) Weight: 215 lb 2.7 oz (97.6 kg) IBW/kg (Calculated) : 75.3  Vital Signs: Temp: 98.4 F (36.9 C) (09/30 1338) Temp Source: Oral (09/30 1338) BP: 127/79 (09/30 1338) Pulse Rate: 81 (09/30 1338)  Labs: Recent Labs    01/05/19 0531  01/06/19 0617 01/06/19 1434 01/06/19 1651 01/07/19 0334 01/07/19 0712 01/07/19 1822  HGB 11.0*  --  9.8* 9.5*  --  9.6*  --   --   HCT 32.4*  --  29.3* 28.8*  --  30.0*  --   --   PLT 247  --  266  --   --  290  --   --   HEPARINUNFRC 0.26*   < > 0.15*  --  0.16*  --  0.32 0.17*  CREATININE 1.50*  --  1.27*  --   --  1.25*  --   --    < > = values in this interval not displayed.    Estimated Creatinine Clearance: 62.7 mL/min (A) (by C-G formula based on SCr of 1.25 mg/dL (H)).   Medical History: Past Medical History:  Diagnosis Date  . Allergic rhinitis   . Anticoagulant long-term use    eliquis  . Cardiomyopathy due to systemic disease [facility])    followed by dr harding  . COPD with emphysema [facility])    pulmologist-  dr Halford Chessman  . Dyspnea    occasional per pt  . History of colon polyps    tubular adenoma 2013  . History of gout    09-05-2017 last flare-up  05/ 2019 3 wks ago, feet  . History of sepsis 02/18/2017   per d/c note probable uti, acute chf, acute renal failure, hypoxia  . Hyperlipidemia   . Hyperplasia of prostate with lower urinary tract symptoms (LUTS)   . Hypertension   . OSA on CPAP    per study 08-03-2004  Severe OSA  . Persistent atrial fibrillation    cardiologist --  dr Dorris Carnes--  post cardioversion 05-07-2014  . Prostate cancer [facility]) urologsit-  dr Alyson Ingles--- as of 05-21-2017 per pt last PSA 11 approx.   Dx  2014--  stage T1c, Gleason 3+3=6, PSA 6.67--  Active surveillance/  04/ 2019  Stage T1b, Gleason 3+4, PSA 12.8- plan external radiation therapy     . 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 ([place])    pulmologist-  dr Halford Chessman  . Seasonal allergies   . Sigmoid diverticulosis   . Systolic and diastolic CHF, chronic [facility])    cardiologist-  [name]  . Tinea versicolor   . Type 2 diabetes mellitus ([place])   . Wears hearing aid in both ears     Assessment: 73 y/o M with new onset PE to begin heparin. Pt noted to have a recent [place] in July 2020 while on Apixaban, CT head today with no acute bleeding noted.[name] discussed with Neuro (Dr. Rory Percy), will proceed with heparin with no bolus and lower goal.  Heparin level now therapeutic at 0.32 this morning. Will increase slightly to prevent it dropping below goal.  PM update: Heparin drip disconnected to bathe patient and then restarted per RN. Heparin off for ~10 min per RN. May have affected the heparin level somewhat.   Goal of Therapy:  Heparin level 0.3-0.5 units/mL, recent ICH in July 2020 Monitor platelets by anticoagulation protocol: Yes  Plan:  -Increase heparin to 2450 units/hr -Check HL in 8 hours   Allena Pietila A. Levada Dy, PharmD, BCPS, FNKF Clinical Pharmacist [place] Please utilize Amion for appropriate phone number to reach the unit pharmacist ([place])   01/07/2019

## 2019-01-07 NOTE — Progress Notes (Signed)
ANTICOAGULATION CONSULT NOTE   Pharmacy Consult for Heparin  Indication: pulmonary embolus  No Known Allergies  Patient Measurements: Height: 5\' 11"  (180.3 cm) Weight: 215 lb 2.7 oz (97.6 kg) IBW/kg (Calculated) : 75.3  Vital Signs: Temp: 98.2 F (36.8 C) (09/30 0530) Temp Source: Oral (09/30 0530) BP: 131/77 (09/30 0530) Pulse Rate: 73 (09/30 0724)  Labs: Recent Labs    01/05/19 0531  01/06/19 0617 01/06/19 1434 01/06/19 1651 01/07/19 0334 01/07/19 0712  HGB 11.0*  --  9.8* 9.5*  --  9.6*  --   HCT 32.4*  --  29.3* 28.8*  --  30.0*  --   PLT 247  --  266  --   --  290  --   HEPARINUNFRC 0.26*   < > 0.15*  --  0.16*  --  0.32  CREATININE 1.50*  --  1.27*  --   --  1.25*  --    < > = values in this interval not displayed.    Estimated Creatinine Clearance: 62.7 mL/min (A) (by C-G formula based on SCr of 1.25 mg/dL (H)).   Medical History: Past Medical History:  Diagnosis Date  . Allergic rhinitis   . Anticoagulant long-term use    eliquis  . Cardiomyopathy due to systemic disease [facility])    followed by dr harding  . COPD with emphysema [facility])    pulmologist-  dr Halford Chessman  . Dyspnea    occasional per pt  . History of colon polyps    tubular adenoma 2013  . History of gout    09-05-2017 last flare-up  05/ 2019 3 wks ago, feet  . History of sepsis 02/18/2017   per d/c note probable uti, acute chf, acute renal failure, hypoxia  . Hyperlipidemia   . Hyperplasia of prostate with lower urinary tract symptoms (LUTS)   . Hypertension   . OSA on CPAP    per study 08-03-2004  Severe OSA  . Persistent atrial fibrillation    cardiologist --  dr Dorris Carnes--  post cardioversion 05-07-2014  . Prostate cancer [facility]) urologsit-  dr Alyson Ingles--- as of 05-21-2017 per pt last PSA 11 approx.   Dx  2014--  stage T1c, Gleason 3+3=6, PSA 6.67--  Active surveillance/  04/ 2019  Stage T1b, Gleason 3+4, PSA 12.8- plan external radiation therapy    . Respiratory bronchiolitis associated  interstitial lung disease ([place])    pulmologist-  dr Halford Chessman  . Seasonal allergies   . Sigmoid diverticulosis   . Systolic and diastolic CHF, chronic [facility])    cardiologist-  [name]  . Tinea versicolor   . Type 2 diabetes mellitus ([place])   . Wears hearing aid in both ears     Assessment: 73 y/o M with new onset PE to begin heparin. Pt noted to have a recent [place] in July 2020 while on Apixaban, CT head today with no acute bleeding noted.[name] discussed with Neuro (Dr. Rory Percy), will proceed with heparin with no bolus and lower goal.  Heparin level now therapeutic at 0.32 this morning. Will increase slightly to prevent it dropping below goal.  Goal of Therapy:  Heparin level 0.3-0.5 units/mL, recent ICH in July 2020 Monitor platelets by anticoagulation protocol: Yes   Plan:  -Increase heparin to 2350 units/hr -Check confirmatory level this afternoon on new rate   Arrie Senate, PharmD, BCPS Clinical Pharmacist [phone] Please check AMION for all [place] numbers 01/07/2019

## 2019-01-07 NOTE — Progress Notes (Signed)
[facility]  PROGRESS NOTE    Ronald [name]  [id number] DOB: July 05, 1945 DOA: 01/03/2019 PCP: Ronald Post, MD   Brief Narrative:   Ronald [name] a 73 y.o.malewith medical history significant forparoxysmal atrial fibrillation no longer on Eliquis after ICH in July 2020, COPD, OSA on CPAP, chronic diastolic CHF, type 2 diabetes mellitus, and seizure disorder after his ICH, now presenting to the emergency department with lower leg pain and confusion. Patient is accompanied by his wife who assists with the history. He has been doing well back at home, ambulating on his own with no appreciable residual weakness from his stroke, but has been unable to ambulate since 01/01/2019 due to bilateral lower leg pain that is much worse on the left. He was also having bilateral leg swelling that improved after increasing his Lasix. His cognitive function remained stable until the afternoon of 9/26/2020when the patient seemed to be confused. He has had some transient episodes of confusion since the [place] in July. EMS was called, patient was found to be saturating in the mid 80s on room air, he was placed on supplemental oxygen, and brought into the ED. He denies any chest pain or palpitations. Denies any melena or hematochezia. Denies headache or acute change in his vision or hearing, or focal numbness or weakness.   Assessment & Plan:   Principal Problem:   Acute pulmonary embolism (HCC) Active Problems:   Type 2 diabetes mellitus with hyperglycemia (HCC)   Paroxysmal atrial fibrillation (HCC)   Seizures ([place]), secondsry to ICH   Acute encephalopathy   CKD (chronic kidney disease), stage III (HCC)   Acute respiratory failure with hypoxia (HCC)   Submassive PE Acute Hypoxic Resp Failure     -Presents with 3 days of bilateral lower leg pain, mainly on left, a few hours of confusion, and was found to be hypoxic with low-normal BP     -CTA reveals acute PE in RLL with evidence for right  heart strain     - PESI class V (male, COPD, confusion, and hypoxia) - 173, very high risk     -Discussed treatment with neurology in light of ICH on 10/21/18 -> recommended repeat head CT with follow up head CT in 24-48 hours prior to switching to oral anticoagulation.     -Continue heparin gtt - will plan for 72 hrs given high risk and then transition to oral anticoagulation after repeat head CT; he's not therapeutic on heparin yet, would like at least 24 hrs therapeutic prior to CT and transition to oral meds     - echo with normal EF - normal RV systolic function (see report)     - Consider discussion with hematology consultation given ICH in July regarding which oral anticoagulant     - ABG obtained due to hypoxia and confusion, no evidence of hypercarbia - titrate O2 for sat of 90-95.     - on RA - 1L; wean as able     - CT head in AM before transition to PO anticoag  Fever Cough     - temp to 100.4 on 9/28.       - Likely secondary above.     - CXR: without acute findings     - UCx with multiple species     - Bld Cx NTD     - hold abx for now     - afebrile last 48hrs  Bilateral leg pain and swelling Concern for Gout Flare     -  Patient unable to ambulate since 9/24 due to bilateral lower leg pain, worse on left     -There is swelling and tenderness noted, no evidence for traumatic or infectious etiology     -Venous US negative for DVT     - colchicine for concern for gout flare; improving     - follow uric acid level (5.8)  History of [place]     -Patient had [place] in July 2020 while on Eliquis for PAF, has been back home now     -Head CT in ED with no acute findings, expected contraction of right frontal hematoma with encephalomalacia     -Monitor closely while starting anticoagulation; repeat CTH in AM  Paroxysmal atrial fibrillation     -In sinus rhythm on admission     -CHADS-VASc is 66 (age, CVA x2, CHF, DM)     -He had stopped Eliquis after ICH in  July 2020, now on IV heparin as above     -Continue amiodarone     - see above  CKD stage III     -SCr is 1.67, up from 1.25 two weeks earlier after increasing his Lasix     -BP is low-normal and there is no pulmonary edema, a small fluid bolus is being given in ED     -Renally-dose medications, hold Lasix and losartan initially, monitor     - SCr 1.25; monitor   Chronic diastolic CHF     -Bilateral LE edema noted but reportedly improved after recent increase in Lasix; there is no pulm edema on CTA     -EF was preserved in July 2020     -He has acute PE      - continue to hold BB, losartan, and lasix     - I&O, daily wts  Type II DM     -A1c was 6.9% in July 2020     -Managed with metformin and glimepiride at home, held on admission     -Use low-intensity SSI with Novolog while inpatient  Seizures     -Developed after ICH, no recent seizure     -Continue valproate   Acute encephalopathy      - Patient presents with confusion and bilateral lower leg pain and swelling      - No acute findings on head CT      - delirium precautions     - seroquel  Hypokalemia Hypomagnesemia     - replace and follow  DVT prophylaxis: Heparin Code Status: FULL Family Communication: With wife at bedside   Disposition Plan: TBD   ROS:  Denies CP, dyspnea, palpitations . Remainder 10-pt ROS is negative for all not previously mentioned.  Subjective: "So I'm gonna see you tomorrow too?"  Objective: Vitals:   01/07/19 0724 01/07/19 0903 01/07/19 1225 01/07/19 1338  BP:  128/80  127/79  Pulse: 73 77 78 81  Resp: 19 (!) 21 (!) 24 (!) 25  Temp:    98.4 F (36.9 C)  TempSrc:    Oral  SpO2: 97% 96% 97% 95%  Weight:      Height:        Intake/Output Summary (Last 24 hours) at 01/07/2019 1515 Last data filed at 01/07/2019 0857 Gross per 24 hour  Intake 1371.01 ml  Output 1075 ml  Net 296.01 ml   Filed Weights   01/05/19 0540 01/06/19 0620 01/07/19 0530   Weight: 98.8 kg 98.2 kg 97.6 kg    Examination:  General: 73 y.o. male resting  in bed in NAD Eyes: PERRL, normal sclera ENMT: Nares patent w/o discharge, orophaynx clear, dentition normal, ears w/o discharge/lesions/ulcers Cardiovascular: RRR, +S1, S2, no m/g/r, equal pulses throughout Respiratory: CTABL, no w/r/r, normal WOB GI: BS+, NDNT, no masses noted, no organomegaly noted MSK: No e/c/c; TTP LLE Skin: No rashes, bruises, ulcerations noted Neuro: A&O x 3, no focal deficits Psyc: Appropriate interaction and affect, calm/cooperative   Data Reviewed: I have personally reviewed following labs and imaging studies.  CBC: Recent Labs  Lab 01/03/19 2053  01/04/19 0457 01/05/19 0531 01/06/19 0617 01/06/19 1434 01/07/19 0334  WBC 6.0  --  5.4 6.0 5.9  --  5.8  NEUTROABS 4.1  --   --   --   --   --   --   HGB 11.3*   < > 10.8* 11.0* 9.8* 9.5* 9.6*  HCT 34.4*   < > 32.7* 32.4* 29.3* 28.8* 30.0*  MCV 99.7  --  97.9 97.0 97.3  --  99.0  PLT 243  --  237 247 266  --  290   < > = values in this interval not displayed.   Basic Metabolic Panel: Recent Labs  Lab 01/03/19 2053 01/03/19 2204 01/04/19 0305 01/04/19 0457 01/05/19 0531 01/06/19 0617 01/07/19 0334  NA 133* 135  --  134* 136 135 138  K 3.3* 3.0*  --  3.0* 3.2* 3.5 3.3*  CL 93*  --   --  96* 99 101 103  CO2 27  --   --  27 27 25 25   GLUCOSE 169*  --   --  131* 135* 146* 144*  BUN 20  --   --  19 18 14 10   CREATININE 1.67*  --   --  1.55* 1.50* 1.27* 1.25*  CALCIUM 8.2*  --   --  7.9* 8.5* 8.1* 8.2*  MG  --   --  1.6* 1.5* 2.1 1.8 1.8   GFR: Estimated Creatinine Clearance: 62.7 mL/min (A) (by C-G formula based on SCr of 1.25 mg/dL (H)). Liver Function Tests: Recent Labs  Lab 01/03/19 2053 01/05/19 0531 01/06/19 0617 01/07/19 0334  AST 17 25 16  33  ALT 18 25 18 27   ALKPHOS 75 77 83 89  BILITOT 0.9 1.0 0.8 0.5  PROT 5.6* 5.6* 4.9* 5.0*  ALBUMIN 2.7* 2.4* 2.0* 2.0*   No results for input(s): LIPASE,  AMYLASE in the last 168 hours. No results for input(s): AMMONIA in the last 168 hours. Coagulation Profile: Recent Labs  Lab 01/03/19 2053  INR 1.1   Cardiac Enzymes: No results for input(s): CKTOTAL, CKMB, CKMBINDEX, TROPONINI in the last 168 hours. BNP (last 3 results) No results for input(s): PROBNP in the last 8760 hours. HbA1C: No results for input(s): HGBA1C in the last 72 hours. CBG: Recent Labs  Lab 01/06/19 1123 01/06/19 1630 01/06/19 2150 01/07/19 0800 01/07/19 1143  GLUCAP 174* 171* 139* 124* 159*   Lipid Profile: No results for input(s): CHOL, HDL, LDLCALC, TRIG, CHOLHDL, LDLDIRECT in the last 72 hours. Thyroid Function Tests: No results for input(s): TSH, T4TOTAL, FREET4, T3FREE, THYROIDAB in the last 72 hours. Anemia Panel: No results for input(s): VITAMINB12, FOLATE, FERRITIN, TIBC, IRON, RETICCTPCT in the last 72 hours. Sepsis Labs: Recent Labs  Lab 01/03/19 2053 01/03/19 2230  LATICACIDVEN 1.1 1.2    Recent Results (from the past 240 hour(s))  Blood Culture (routine x 2)     Status: None (Preliminary result)   Collection Time: 01/03/19  8:56 PM  Specimen: BLOOD  Result Value Ref Range Status   Specimen Description BLOOD RIGHT ARM  Final   Special Requests   Final    BOTTLES DRAWN AEROBIC AND ANAEROBIC Blood Culture results may not be optimal due to an excessive volume of blood received in culture bottles   Culture   Final    NO GROWTH 4 DAYS Performed at [facility], [place] [address], [place], [place] [address]    Report Status PENDING  Incomplete  Blood Culture (routine x 2)     Status: None (Preliminary result)   Collection Time: 01/03/19  8:58 PM   Specimen: BLOOD  Result Value Ref Range Status   Specimen Description BLOOD RIGHT ARM  Final   Special Requests   Final    BOTTLES DRAWN AEROBIC AND ANAEROBIC Blood Culture results may not be optimal due to an excessive volume of blood received in culture bottles   Culture   Final    NO  GROWTH 4 DAYS Performed at [facility], [place] [address], [place], [place] [address]    Report Status PENDING  Incomplete  Urine culture     Status: Abnormal   Collection Time: 01/03/19  9:24 PM   Specimen: Urine, Clean Catch  Result Value Ref Range Status   Specimen Description URINE, CLEAN CATCH  Final   Special Requests   Final    NONE Performed at [facility], [place] [address] [place], [place] [address]    Culture MULTIPLE SPECIES PRESENT, SUGGEST RECOLLECTION (A)  Final   Report Status 01/05/2019 FINAL  Final  SARS CORONAVIRUS 2 (TAT 6-24 HRS) Nasopharyngeal Nasopharyngeal Swab     Status: None   Collection Time: 01/03/19  9:38 PM   Specimen: Nasopharyngeal Swab  Result Value Ref Range Status   SARS Coronavirus 2 NEGATIVE NEGATIVE Final    Comment: (NOTE) SARS-CoV-2 target nucleic acids are NOT DETECTED. The SARS-CoV-2 RNA is generally detectable in upper and lower respiratory specimens during the acute phase of infection. Negative results do not preclude SARS-CoV-2 infection, do not rule out co-infections with other pathogens, and should not be used as the sole basis for treatment or other patient management decisions. Negative results must be combined with clinical observations, patient history, and epidemiological information. The expected result is Negative. Fact Sheet for Patients: SugarRoll.be Fact Sheet for Healthcare Providers: https://www.woods-mathews.com/ This test is not yet approved or cleared by the [place] FDA and  has been authorized for detection and/or diagnosis of SARS-CoV-2 by FDA under an Emergency Use Authorization (EUA). This EUA will remain  in effect (meaning this test can be used) for the duration of the COVID-19 declaration under Section 56 4(b)(1) of the Act, 21 U.S.C. section 360bbb-3(b)(1), unless the authorization is terminated or revoked sooner. Performed at [facility],  [place] [address] [place], [place] [address]       Radiology Studies: Dg Chest [facility] 1 View  Result Date: 01/06/2019 CLINICAL DATA:  Fevers, known pulmonary emboli EXAM: PORTABLE CHEST 1 VIEW COMPARISON:  01/03/2019 FINDINGS: Cardiac shadow is mildly prominent. Lungs are well aerated bilaterally. No focal infiltrate or sizable effusion is seen. The overall appearance is stable from the prior exam. IMPRESSION: No acute abnormality noted. Electronically Signed   By: Inez Catalina M.D.   On: 01/06/2019 09:53     Scheduled Meds: . acetaminophen  1,000 mg Oral Q8H  . amiodarone  200 mg Oral Daily  . atorvastatin  10 mg Oral  QPM  . colchicine  0.6 mg Oral BID  . divalproex  500 mg Oral Q12H  . insulin aspart  0-9 Units Subcutaneous TID WC  . mometasone-formoterol  2 puff Inhalation BID  . nicotine  21 mg Transdermal Daily  . QUEtiapine  25 mg Oral QHS  . sodium chloride flush  3 mL Intravenous Q12H  . sodium chloride flush  3 mL Intravenous Q12H   Continuous Infusions: . sodium chloride    . heparin 2,350 Units/hr (01/07/19 1346)  . sodium chloride       LOS: 3 days    Time spent: 25 minutes spent in the coordination of care today.    Jonnie Finner, DO [facility] Pager [phone]  If 7PM-7AM, please contact night-coverage [email or web address] Password [email or web address] 01/07/2019, 3:15 PM

## 2019-01-08 ENCOUNTER — Inpatient Hospital Stay (HOSPITAL_COMMUNITY): Payer: Medicare Other

## 2019-01-08 LAB — GLUCOSE, CAPILLARY
Glucose-Capillary: 134 mg/dL — ABNORMAL HIGH (ref 70–99)
Glucose-Capillary: 209 mg/dL — ABNORMAL HIGH (ref 70–99)
Glucose-Capillary: 193 mg/dL — ABNORMAL HIGH (ref 70–99)
Glucose-Capillary: 144 mg/dL — ABNORMAL HIGH (ref 70–99)

## 2019-01-08 LAB — CBC
HCT: 29.9 % — ABNORMAL LOW (ref 39.0–52.0)
Hemoglobin: 9.5 g/dL — ABNORMAL LOW (ref 13.0–17.0)
MCH: 31.6 pg (ref 26.0–34.0)
MCHC: 31.8 g/dL (ref 30.0–36.0)
MCV: 99.3 fL (ref 80.0–100.0)
Platelets: 324 10*3/uL (ref 150–400)
RBC: 3.01 MIL/uL — ABNORMAL LOW (ref 4.22–5.81)
RDW: 13.4 % (ref 11.5–15.5)
WBC: 5.5 10*3/uL (ref 4.0–10.5)
nRBC: 0 % (ref 0.0–0.2)

## 2019-01-08 LAB — CULTURE, BLOOD (ROUTINE X 2)
Culture: NO GROWTH
Culture: NO GROWTH

## 2019-01-08 LAB — RENAL FUNCTION PANEL
Albumin: 2 g/dL — ABNORMAL LOW (ref 3.5–5.0)
Anion gap: 9 (ref 5–15)
BUN: 10 mg/dL (ref 8–23)
CO2: 24 mmol/L (ref 22–32)
Calcium: 8.3 mg/dL — ABNORMAL LOW (ref 8.9–10.3)
Chloride: 105 mmol/L (ref 98–111)
Creatinine, Ser: 1.21 mg/dL (ref 0.61–1.24)
GFR calc Af Amer: >60 mL/min (ref >60)
GFR calc non Af Amer: 59 mL/min — ABNORMAL LOW (ref >60)
Glucose, Bld: 157 mg/dL — ABNORMAL HIGH (ref 70–99)
Phosphorus: 3.1 mg/dL (ref 2.5–4.6)
Potassium: 3.5 mmol/L (ref 3.5–5.1)
Sodium: 138 mmol/L (ref 135–145)

## 2019-01-08 LAB — MAGNESIUM: Magnesium: 1.6 mg/dL — ABNORMAL LOW (ref 1.7–2.4)

## 2019-01-08 LAB — HEPARIN LEVEL (UNFRACTIONATED): Heparin Unfractionated: 0.31 IU/mL (ref 0.30–0.70)

## 2019-01-08 MED ORDER — MAGNESIUM OXIDE 400 (241.3 MG) MG PO TABS
400.0000 mg | ORAL_TABLET | Freq: Two times a day (BID) | ORAL | Status: DC
  Administered 2019-01-08 – 2019-01-09 (×3): 400 mg via ORAL
  Filled 2019-01-08 (×3): qty 1

## 2019-01-08 MED ORDER — APIXABAN 5 MG PO TABS
10.0000 mg | ORAL_TABLET | Freq: Two times a day (BID) | ORAL | Status: DC
  Administered 2019-01-08 – 2019-01-09 (×3): 10 mg via ORAL
  Filled 2019-01-08 (×4): qty 2

## 2019-01-08 MED ORDER — APIXABAN 5 MG PO TABS: 5.0000 mg | ORAL_TABLET | Freq: Two times a day (BID) | ORAL | Status: DC

## 2019-01-08 NOTE — Plan of Care (Signed)
  Problem: Education: Goal: Knowledge of General Education information will improve Description Including pain rating scale, medication(s)/side effects and non-pharmacologic comfort measures Outcome: Progressing   

## 2019-01-08 NOTE — TOC Initial Note (Addendum)
Transition of Care [facility]) - Initial/Assessment Note    Patient Details  Name: Ronald [name] MRN: [id number] Date of Birth: 05/12/1945  [facility]) CM/SW Contact:    Carles Collet, RN Phone Number: 01/08/2019, 10:46 AM  Clinical Narrative:     Patient in procedure, unable to meet in room.  Spoke w MD to specify what CM consult for. MD states patient had [place] services PTA. Reviewed chart and confirmed w Tommi Rumps that he was active for RN PT OT. Update spoke w patient and wife at bedside. They have RW and WC at home, no new DME needs.   Patient will need resumption orders at DC              Expected Discharge Plan: [place] Barriers to Discharge: Continued Medical Work up   Patient Goals and CMS Choice        Expected Discharge Plan and Services Expected Discharge Plan: [place] Arranged: RN, PT, OT [facility] Agency: [place] Date [place]: 01/08/19 Time HH Agency Contacted: 48 Representative spoke with at [place]: Tommi Rumps (confirmed patient was active w RN PT OT prior to admission)  Prior Living Arrangements/Services   Lives with:: Spouse                   Activities of Daily Living Home Assistive Devices/Equipment: CPAP, CBG Meter ADL Screening (condition at time of admission) Patient's cognitive ability adequate to safely complete daily activities?: Yes Is the patient deaf or have difficulty hearing?: No Does the patient have difficulty seeing, even when wearing glasses/contacts?: No Does the patient have difficulty concentrating, remembering, or making decisions?: No Patient able to express need for assistance with ADLs?: Yes Does the patient have difficulty dressing or bathing?: No Independently performs ADLs?: Yes (appropriate for developmental age) Does the patient have difficulty walking or climbing stairs?: Yes Weakness of Legs: None Weakness  of Arms/Hands: None  Permission Sought/Granted                  Emotional Assessment              Admission diagnosis:  AMS Patient Active Problem List   Diagnosis Date Noted  . Acute pulmonary embolism ([place]) 01/04/2019  . Acute respiratory failure with hypoxia ([place]) 01/04/2019  . Pain   . AMS (altered mental status) 11/23/2018  . Acute encephalopathy 11/22/2018  . CKD (chronic kidney disease), stage III 11/22/2018  . Thoracic ascending aortic aneurysm ([place]) 11/22/2018  . Seizures ([place]), secondsry to [facility] 10/23/2018  . Coagulopathy ([place]), Eliquis 10/23/2018  . Cerebral edema ([place]) 10/23/2018  . Hypertensive emergency 10/23/2018  . Advanced age 08/23/2018  . ICH (intracerebral hemorrhage) ([place]) w/ SAH while on Eliquis 10/21/2018  . Hepatic steatosis 04/22/2018  . Actinic keratoses 06/07/2017  . BPH (benign prostatic hyperplasia) 05/30/2017  . Cardiomyopathy due to systemic disease ([place]) 03/09/2017  . Chronic diastolic CHF (congestive heart failure) ([place])   . History of colonic polyps 07/03/2016  . Chronic anticoagulation 07/03/2016  . Hyperglycemia, drug-induced 04/05/2015  . 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 On amiodarone therapy 12/08/2014  . Edema of both legs 12/08/2014  . Exertional dyspnea 08/18/2014  . Hypokalemia   .  Tobacco abuse   . Paroxysmal atrial fibrillation ([place]) 05/04/2014  . Cigarette smoker two packs a day or less   . Prostate cancer ([place]) 10/15/2013  . Obesity (BMI 30-39.9) 04/17/2013  . Umbilical hernia [id number]  . Right inguinal hernia 06/23/2012  . Hydrocele 06/19/2012  . Metabolic syndrome [id number]  . Type 2 diabetes mellitus with hyperglycemia ([place]) 03/20/2012  . Elevated PSA 03/20/2012  . Gout, unspecified 10/07/2009  . TINEA VERSICOLOR 07/19/2009  . PERS HX TOBACCO USE PRESENTING HAZARDS HEALTH 07/19/2009  . Obstructive sleep apnea 07/02/2008  . Hyperlipidemia with target LDL  less than 70 07/01/2008  . Essential hypertension 07/01/2008  . ALLERGIC RHINITIS 07/01/2008   PCP:  [name], MD Pharmacy:   [id number] [place], [place] [address] Phone: [phone] Fax: [phone]     Social Determinants of Health (SDOH) Interventions    Readmission Risk Interventions No flowsheet data found.

## 2019-01-08 NOTE — Progress Notes (Signed)
ANTICOAGULATION CONSULT NOTE   Pharmacy Consult for Heparin  Indication: pulmonary embolus  No Known Allergies  Patient Measurements: Height: 5\' 11"  (180.3 cm) Weight: 215 lb 2.7 oz (97.6 kg) IBW/kg (Calculated) : 75.3  Vital Signs: Temp: 98.6 F (37 C) (09/30 2052) Temp Source: Oral (09/30 2052) BP: 133/83 (09/30 2052) Pulse Rate: 78 (10/01 0100)  Labs: Recent Labs    01/06/19 0617 01/06/19 1434  01/07/19 0334 01/07/19 0712 01/07/19 1822 01/08/19 0334  HGB 9.8* 9.5*  --  9.6*  --   --  9.5*  HCT 29.3* 28.8*  --  30.0*  --   --  29.9*  PLT 266  --   --  290  --   --  324  HEPARINUNFRC 0.15*  --    < >  --  0.32 0.17* 0.31  CREATININE 1.27*  --   --  1.25*  --   --  1.21   < > = values in this interval not displayed.    Estimated Creatinine Clearance: 64.8 mL/min (by C-G formula based on SCr of 1.21 mg/dL).   Assessment: 73 y/o M with new onset PE to begin heparin. Pt noted to have a recent [place] in July 2020 while on Apixaban, CT head today with no acute bleeding noted.[name] discussed with Neuro (Dr. Rory Percy), will proceed with heparin with no bolus and lower goal.  Heparin level therapeutic this am   Goal of Therapy:  Heparin level 0.3-0.5 units/mL, recent ICH in July 2020 Monitor platelets by anticoagulation protocol: Yes   Plan:  -Continue heparin to 2450 units/hr Daily HL and CBC Monitor for bleeding complications  Thanks for allowing pharmacy to be a part of this patient's care.  Excell Seltzer, PharmD Clinical Pharmacist    01/08/2019

## 2019-01-08 NOTE — Progress Notes (Signed)
[facility]  PROGRESS NOTE    MARKELL AVRAM  [id number] DOB: Jan 29, 1946 DOA: 01/03/2019 PCP: [name], MD   Brief Narrative:   Ronald [name] a 73 y.o.malewith medical history significant forparoxysmal atrial fibrillation no longer on Eliquis after ICH in July 2020, COPD, OSA on CPAP, chronic diastolic CHF, type 2 diabetes mellitus, and seizure disorder after his ICH, now presenting to the emergency department with lower leg pain and confusion. Patient is accompanied by his wife who assists with the history. He has been doing well back at home, ambulating on his own with no appreciable residual weakness from his stroke, but has been unable to ambulate since 01/01/2019 due to bilateral lower leg pain that is much worse on the left. He was also having bilateral leg swelling that improved after increasing his Lasix. His cognitive function remained stable until the afternoon of 9/26/2020when the patient seemed to be confused. He has had some transient episodes of confusion since the [place] in July. EMS was called, patient was found to be saturating in the mid 80s on room air, he was placed on supplemental oxygen, and brought into the ED. He denies any chest pain or palpitations. Denies any melena or hematochezia. Denies headache or acute change in his vision or hearing, or focal numbness or weakness.  10/1: Did well ON. CTH this morning ok.    Assessment & Plan:   Principal Problem:   Acute pulmonary embolism (HCC) Active Problems:   Type 2 diabetes mellitus with hyperglycemia (HCC)   Paroxysmal atrial fibrillation (HCC)   Seizures ([place]), secondsry to ICH   Acute encephalopathy   CKD (chronic kidney disease), stage III   Acute respiratory failure with hypoxia (HCC)   Submassive PE Acute Hypoxic Resp Failure     -Presents with 3 days of bilateral lower leg pain, mainly on left, a few hours of confusion, and was found to be hypoxic with low-normal BP     -CTA reveals  acute PE in RLL with evidence for right heart strain     - PESI class V (male, COPD, confusion, and hypoxia) - 173, very high risk     -Discussed treatment with neurology in light of ICH on 10/21/18 -> recommended repeat head CT with follow up head CT in 24-48 hours prior to switching to oral anticoagulation.     -Continue heparin gtt - will plan for 72 hrs given high risk and then transition to oral anticoagulation after repeat head CT; he's not therapeutic on heparin yet, would like at least 24 hrs therapeutic prior to CT and transition to oral meds     - echo with normal EF - normal RV systolic function (see report)     - Consider discussion with hematology consultation given ICH in July regarding which oral anticoagulant     - ABG obtained due to hypoxia and confusion, no evidence of hypercarbia - titrate O2 for sat of 90-95.     - on RA - 1L; wean as able     - CT head in AM before transition to PO anticoag     - CTH ok     - spoke with neurology; ok to resume eliquis given a fib risks and presentation with PE/DVT     - will resume ramp up eliquis; if stable ON, d/c to home on same  Fever Cough     - temp to 100.4 on 9/28.       - Likely secondary above.     -  CXR: without acute findings     - UCx with multiple species     - Bld Cx NTD     - hold abx for now     - afebrile last 48hrs  Bilateral leg pain and swelling Concern for Gout Flare     -Patient unable to ambulate since 9/24 due to bilateral lower leg pain, worse on left     -There is swelling and tenderness noted, no evidence for traumatic or infectious etiology     -Venous US negative for DVT     - colchicine for concern for gout flare; improving     - follow uric acid level (5.8)  History of ICH     -Patient had [place] in July 2020 while on Eliquis for PAF, has been back home now     -Head CT in ED with no acute findings, expected contraction of right frontal hematoma with encephalomalacia     -Monitor  closely while starting anticoagulation; repeat CTH in AM     - CTH ok     - spoke with neurology; ok to resume eliquis given a fib risks and presentation with PE/DVT     - will resume ramp up eliquis; if stable ON, d/c to home on same  Paroxysmal atrial fibrillation     -In sinus rhythm on admission     -CHADS-VASc is 39 (age, CVA x2, CHF, DM)     -He had stopped Eliquis after ICH in July 2020, now on IV heparin as above     -Continue amiodarone     - see above     - sp  CKD stage III     -SCr is 1.67, up from 1.25 two weeks earlier after increasing his Lasix     -BP is low-normal and there is no pulmonary edema, a small fluid bolus is being given in ED     -Renally-dose medications, hold Lasix and losartan initially, monitor     - SCr 1.21; monitor   Chronic diastolic CHF     -Bilateral LE edema noted but reportedly improved after recent increase in Lasix; there is no pulm edema on CTA     -EF was preserved in July 2020     -He has acute PE      - continue to hold BB, losartan, and lasix     - I&O, daily wts  Type II DM     -A1c was 6.9% in July 2020     -Managed with metformin and glimepiride at home, held on admission     -Use low-intensity SSI with Novolog while inpatient  Seizures     -Developed after ICH, no recent seizure     -Continue valproate   Acute encephalopathy      - Patient presents with confusion and bilateral lower leg pain and swelling      - No acute findings on head CT      - delirium precautions     - seroquel     - resolved  Hypokalemia Hypomagnesemia     - replace and follow  DVT prophylaxis: eliquis Code Status: FULL Family Communication: With wife at bedside   Disposition Plan: To home with [place]; awaiting PT eval.  ROS:  Denies CP, dyspnea, palpitations, N, V, ab pain. Remainder 10-pt ROS is negative for all not previously mentioned.  Subjective: "I do remember you."  Objective: Vitals:    01/08/19 0100 01/08/19 0650 01/08/19 0728 01/08/19 1422  BP:  124/77  120/80  Pulse: 78 75 74 73  Resp: 18 19 19  (!) 22  Temp:  97.6 F (36.4 C)  97.8 F (36.6 C)  TempSrc:  Oral  Oral  SpO2: 96% 93% 95% 97%  Weight:  98 kg    Height:        Intake/Output Summary (Last 24 hours) at 01/08/2019 1439 Last data filed at 01/08/2019 1014 Gross per 24 hour  Intake 1041.3 ml  Output 2000 ml  Net -958.7 ml   Filed Weights   01/06/19 0620 01/07/19 0530 01/08/19 0650  Weight: 98.2 kg 97.6 kg 98 kg    Examination:  General: 73 y.o. male resting in bed in NAD Eyes: PERRL, normal sclera ENMT: Nares patent w/o discharge, orophaynx clear, dentition normal, ears w/o discharge/lesions/ulcers Cardiovascular: RRR, +S1, S2, no m/g/r, equal pulses throughout Respiratory: CTABL, no w/r/r, normal WOB GI: BS+, NDNT, no masses noted, no organomegaly noted MSK: No e/c/c Neuro: A&O x 3, no focal deficits   Data Reviewed: I have personally reviewed following labs and imaging studies.  CBC: Recent Labs  Lab 01/03/19 2053  01/04/19 0457 01/05/19 0531 01/06/19 0617 01/06/19 1434 01/07/19 0334 01/08/19 0334  WBC 6.0  --  5.4 6.0 5.9  --  5.8 5.5  NEUTROABS 4.1  --   --   --   --   --   --   --   HGB 11.3*   < > 10.8* 11.0* 9.8* 9.5* 9.6* 9.5*  HCT 34.4*   < > 32.7* 32.4* 29.3* 28.8* 30.0* 29.9*  MCV 99.7  --  97.9 97.0 97.3  --  99.0 99.3  PLT 243  --  237 247 266  --  290 324   < > = values in this interval not displayed.   Basic Metabolic Panel: Recent Labs  Lab 01/04/19 0457 01/05/19 0531 01/06/19 0617 01/07/19 0334 01/08/19 0334  NA 134* 136 135 138 138  K 3.0* 3.2* 3.5 3.3* 3.5  CL 96* 99 101 103 105  CO2 27 27 25 25 24   GLUCOSE 131* 135* 146* 144* 157*  BUN 19 18 14 10 10   CREATININE 1.55* 1.50* 1.27* 1.25* 1.21  CALCIUM 7.9* 8.5* 8.1* 8.2* 8.3*  MG 1.5* 2.1 1.8 1.8 1.6*  PHOS  --   --   --   --  3.1   GFR: Estimated Creatinine Clearance: 64.9 mL/min (by C-G formula  based on SCr of 1.21 mg/dL). Liver Function Tests: Recent Labs  Lab 01/03/19 2053 01/05/19 0531 01/06/19 0617 01/07/19 0334 01/08/19 0334  AST 17 25 16  33  --   ALT 18 25 18 27   --   ALKPHOS 75 77 83 89  --   BILITOT 0.9 1.0 0.8 0.5  --   PROT 5.6* 5.6* 4.9* 5.0*  --   ALBUMIN 2.7* 2.4* 2.0* 2.0* 2.0*   No results for input(s): LIPASE, AMYLASE in the last 168 hours. No results for input(s): AMMONIA in the last 168 hours. Coagulation Profile: Recent Labs  Lab 01/03/19 2053  INR 1.1   Cardiac Enzymes: No results for input(s): CKTOTAL, CKMB, CKMBINDEX, TROPONINI in the last 168 hours. BNP (last 3 results) No results for input(s): PROBNP in the last 8760 hours. HbA1C: No results for input(s): HGBA1C in the last 72 hours. CBG: Recent Labs  Lab 01/07/19 1143 01/07/19 1645 01/07/19 2057 01/08/19 0727 01/08/19 1115  GLUCAP 159* 156* 157* 134* 209*   Lipid Profile: No results for input(s): CHOL, HDL, LDLCALC,  TRIG, CHOLHDL, LDLDIRECT in the last 72 hours. Thyroid Function Tests: No results for input(s): TSH, T4TOTAL, FREET4, T3FREE, THYROIDAB in the last 72 hours. Anemia Panel: No results for input(s): VITAMINB12, FOLATE, FERRITIN, TIBC, IRON, RETICCTPCT in the last 72 hours. Sepsis Labs: Recent Labs  Lab 01/03/19 2053 01/03/19 2230  LATICACIDVEN 1.1 1.2    Recent Results (from the past 240 hour(s))  Blood Culture (routine x 2)     Status: None   Collection Time: 01/03/19  8:56 PM   Specimen: BLOOD  Result Value Ref Range Status   Specimen Description BLOOD RIGHT ARM  Final   Special Requests   Final    BOTTLES DRAWN AEROBIC AND ANAEROBIC Blood Culture results may not be optimal due to an excessive volume of blood received in culture bottles   Culture   Final    NO GROWTH 5 DAYS Performed at [facility], [place] [address], [place], [place] [address]    Report Status 01/08/2019 FINAL  Final  Blood Culture (routine x 2)     Status: None   Collection Time:  01/03/19  8:58 PM   Specimen: BLOOD  Result Value Ref Range Status   Specimen Description BLOOD RIGHT ARM  Final   Special Requests   Final    BOTTLES DRAWN AEROBIC AND ANAEROBIC Blood Culture results may not be optimal due to an excessive volume of blood received in culture bottles   Culture   Final    NO GROWTH 5 DAYS Performed at [facility], [place] [address], [place], [place] [address]    Report Status 01/08/2019 FINAL  Final  Urine culture     Status: Abnormal   Collection Time: 01/03/19  9:24 PM   Specimen: Urine, Clean Catch  Result Value Ref Range Status   Specimen Description URINE, CLEAN CATCH  Final   Special Requests   Final    NONE Performed at [facility], [place] [address], [place], [place] [address]    Culture MULTIPLE SPECIES PRESENT, SUGGEST RECOLLECTION (A)  Final   Report Status 01/05/2019 FINAL  Final  SARS CORONAVIRUS 2 (TAT 6-24 HRS) Nasopharyngeal Nasopharyngeal Swab     Status: None   Collection Time: 01/03/19  9:38 PM   Specimen: Nasopharyngeal Swab  Result Value Ref Range Status   SARS Coronavirus 2 NEGATIVE NEGATIVE Final    Comment: (NOTE) SARS-CoV-2 target nucleic acids are NOT DETECTED. The SARS-CoV-2 RNA is generally detectable in upper and lower respiratory specimens during the acute phase of infection. Negative results do not preclude SARS-CoV-2 infection, do not rule out co-infections with other pathogens, and should not be used as the sole basis for treatment or other patient management decisions. Negative results must be combined with clinical observations, patient history, and epidemiological information. The expected result is Negative. Fact Sheet for Patients: SugarRoll.be Fact Sheet for Healthcare Providers: https://www.woods-mathews.com/ This test is not yet approved or cleared by the [place] FDA and  has been authorized for detection and/or diagnosis of SARS-CoV-2 by FDA under  an Emergency Use Authorization (EUA). This EUA will remain  in effect (meaning this test can be used) for the duration of the COVID-19 declaration under Section 56 4(b)(1) of the Act, 21 U.S.C. section 360bbb-3(b)(1), unless the authorization is terminated or revoked sooner. Performed at [facility], [place] [address], [place], [place] [address]       Radiology Studies: Ct Head Wo Contrast  Result Date: 01/08/2019 CLINICAL DATA:  History of intracranial haemorrhage, recently started on heparin drip for PE, intracranial hemorrhage, known, follow-up. EXAM: CT HEAD WITHOUT CONTRAST TECHNIQUE: Contiguous axial images were obtained from the base of the skull through the vertex without intravenous contrast. COMPARISON:  Head CT examinations 01/03/2019 and early FINDINGS: Brain: Developing encephalomalacia/gliosis and likely persistent edema at site of a previous paramedian anterior right frontal lobe parenchymal hemorrhage. No evidence of acute intracranial hemorrhage. No evidence of intracranial mass. No midline shift or extra-axial fluid collection. Stable generalized atrophy and chronic small vessel ischemic disease. Partially empty sella turcica. Vascular: No hyperdense vessel. Atherosclerotic calcification of the carotid artery siphons and vertebrobasilar system. Skull: Normal. Negative for fracture or focal lesion. Sinuses/Orbits: Visualized orbits demonstrate no acute abnormality. Mild scattered paranasal sinus mucosal thickening. No significant mastoid effusion. IMPRESSION: 1. Developing encephalomalacia/gliosis and likely persistent edema at site of a previous paramedian anterior right frontal lobe parenchymal hemorrhage. No evidence of acute intracranial hemorrhage. 2. Stable generalized atrophy and chronic small vessel ischemic disease. Electronically Signed   By: Kellie Simmering   On: 01/08/2019 09:11     Scheduled Meds:  amiodarone  200 mg Oral Daily   apixaban  10 mg Oral BID    Followed by   Derrill Memo ON 01/12/2019] apixaban  5 mg Oral BID   atorvastatin  10 mg Oral QPM   colchicine  0.6 mg Oral BID   divalproex  500 mg Oral Q12H   insulin aspart  0-9 Units Subcutaneous TID WC   mometasone-formoterol  2 puff Inhalation BID   nicotine  21 mg Transdermal Daily   potassium chloride  40 mEq Oral BID   QUEtiapine  25 mg Oral QHS   sodium chloride flush  3 mL Intravenous Q12H   sodium chloride flush  3 mL Intravenous Q12H   Continuous Infusions:  sodium chloride     sodium chloride       LOS: 4 days    Time spent: 25 minutes spent in the coordination of care today.   Jonnie Finner, DO [facility] Pager [phone]  If 7PM-7AM, please contact night-coverage [email or web address] Password [facility] 01/08/2019, 2:39 PM

## 2019-01-08 NOTE — Progress Notes (Signed)
Patient's spo2 dropped while patient was sleeping. RN called RT to place patient on CPAP. RT placed patient on CPAP with 2L O2 bled into circuit. Patient tolerating CPAP well at this time. RT will monitor as needed.

## 2019-01-08 NOTE — Progress Notes (Signed)
ANTICOAGULATION CONSULT NOTE   Pharmacy Consult for Heparin  Indication: pulmonary embolus  No Known Allergies  Patient Measurements: Height: 5\' 11"  (180.3 cm) Weight: 216 lb 0.8 oz (98 kg) IBW/kg (Calculated) : 75.3  Vital Signs: Temp: 97.6 F (36.4 C) (10/01 0650) Temp Source: Oral (10/01 0650) BP: 124/77 (10/01 0650) Pulse Rate: 74 (10/01 0728)  Labs: Recent Labs    01/06/19 0617 01/06/19 1434  01/07/19 0334 01/07/19 0712 01/07/19 1822 01/08/19 0334  HGB 9.8* 9.5*  --  9.6*  --   --  9.5*  HCT 29.3* 28.8*  --  30.0*  --   --  29.9*  PLT 266  --   --  290  --   --  324  HEPARINUNFRC 0.15*  --    < >  --  0.32 0.17* 0.31  CREATININE 1.27*  --   --  1.25*  --   --  1.21   < > = values in this interval not displayed.    Estimated Creatinine Clearance: 64.9 mL/min (by C-G formula based on SCr of 1.21 mg/dL).   Medical History: Past Medical History:  Diagnosis Date  . Allergic rhinitis   . Anticoagulant long-term use    eliquis  . Cardiomyopathy due to systemic disease [facility])    followed by dr harding  . COPD with emphysema [facility])    pulmologist-  dr Halford Chessman  . Dyspnea    occasional per pt  . History of colon polyps    tubular adenoma 2013  . History of gout    09-05-2017 last flare-up  05/ 2019 3 wks ago, feet  . History of sepsis 02/18/2017   per d/c note probable uti, acute chf, acute renal failure, hypoxia  . Hyperlipidemia   . Hyperplasia of prostate with lower urinary tract symptoms (LUTS)   . Hypertension   . OSA on CPAP    per study 08-03-2004  Severe OSA  . Persistent atrial fibrillation    cardiologist --  dr Dorris Carnes--  post cardioversion 05-07-2014  . Prostate cancer [facility]) urologsit-  dr Alyson Ingles--- as of 05-21-2017 per pt last PSA 11 approx.   Dx  2014--  stage T1c, Gleason 3+3=6, PSA 6.67--  Active surveillance/  04/ 2019  Stage T1b, Gleason 3+4, PSA 12.8- plan external radiation therapy    . 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 Seasonal allergies   . Sigmoid diverticulosis   . Systolic and diastolic CHF, chronic [facility])    cardiologist-  [name]  . Tinea versicolor   . Type 2 diabetes mellitus ([place])   . Wears hearing aid in both ears     Assessment: 73 y/o M with new onset PE to begin heparin. Pt noted to have a recent [place] in July 2020 while on Apixaban, CT head today with no acute bleeding noted.[name] discussed with Neuro (Dr. Rory Percy), will proceed with heparin with no bolus and lower goal.  Pt now to transition back to apixaban for continued VTE management. Although pt has been on IV heparin for 5 days, it has only been therapeutic for ~1.5 days so will abridge apixaban load slightly.  Goal of Therapy:  Heparin level 0.3-0.5 units/mL, recent ICH in July 2020 Monitor platelets by anticoagulation protocol: Yes   Plan:  -Apixaban 10mg  PO BID x4 days then 5mg  BID -Stop IV heparin and give first dose of apixaban this morning -Watch neuro status closely -Pharmacy will  sign off, reconsult with issues or questions   Arrie Senate, PharmD, BCPS Clinical Pharmacist [phone] Please check AMION for all [facility] numbers 01/08/2019

## 2019-01-09 ENCOUNTER — Other Ambulatory Visit: Payer: Self-pay | Admitting: *Deleted

## 2019-01-09 LAB — RENAL FUNCTION PANEL
Albumin: 2.1 g/dL — ABNORMAL LOW (ref 3.5–5.0)
Anion gap: 7 (ref 5–15)
BUN: 9 mg/dL (ref 8–23)
CO2: 24 mmol/L (ref 22–32)
Calcium: 8.2 mg/dL — ABNORMAL LOW (ref 8.9–10.3)
Chloride: 108 mmol/L (ref 98–111)
Creatinine, Ser: 1.18 mg/dL (ref 0.61–1.24)
GFR calc Af Amer: >60 mL/min (ref >60)
GFR calc non Af Amer: >60 mL/min (ref >60)
Glucose, Bld: 147 mg/dL — ABNORMAL HIGH (ref 70–99)
Phosphorus: 2.9 mg/dL (ref 2.5–4.6)
Potassium: 4 mmol/L (ref 3.5–5.1)
Sodium: 139 mmol/L (ref 135–145)

## 2019-01-09 LAB — GLUCOSE, CAPILLARY: Glucose-Capillary: 151 mg/dL — ABNORMAL HIGH (ref 70–99)

## 2019-01-09 LAB — MAGNESIUM: Magnesium: 1.7 mg/dL (ref 1.7–2.4)

## 2019-01-09 LAB — CBC
HCT: 30.2 % — ABNORMAL LOW (ref 39.0–52.0)
Hemoglobin: 10.1 g/dL — ABNORMAL LOW (ref 13.0–17.0)
MCH: 32.8 pg (ref 26.0–34.0)
MCHC: 33.4 g/dL (ref 30.0–36.0)
MCV: 98.1 fL (ref 80.0–100.0)
Platelets: 338 10*3/uL (ref 150–400)
RBC: 3.08 MIL/uL — ABNORMAL LOW (ref 4.22–5.81)
RDW: 13.6 % (ref 11.5–15.5)
WBC: 5.6 10*3/uL (ref 4.0–10.5)
nRBC: 0 % (ref 0.0–0.2)

## 2019-01-09 MED ORDER — APIXABAN 5 MG PO TABS: 5.0000 mg | ORAL_TABLET | Freq: Two times a day (BID) | ORAL | 0 refills | Status: DC

## 2019-01-09 MED ORDER — LOSARTAN POTASSIUM 25 MG PO TABS: 25.0000 mg | ORAL_TABLET | Freq: Every day | ORAL | 3 refills | Status: DC

## 2019-01-09 MED ORDER — APIXABAN 5 MG PO TABS: 10.0000 mg | ORAL_TABLET | Freq: Two times a day (BID) | ORAL | 0 refills | Status: DC

## 2019-01-09 NOTE — Discharge Instructions (Signed)
Information on my medicine - ELIQUIS (apixaban)  This medication education was reviewed with me or my healthcare representative as part of my discharge preparation.  The pharmacist that spoke with me during my hospital stay was:  Einar Grad, [facility]  Why was Eliquis prescribed for you? Eliquis was prescribed to treat blood clots that may have been found in the veins of your legs (deep vein thrombosis) or in your lungs (pulmonary embolism) and to reduce the risk of them occurring again.  What do You need to know about Eliquis ? The starting dose is 10 mg (two 5 mg tablets) taken TWICE daily for the FIRST FOUR (4) DAYS, then on (enter date)  01/12/19  the dose is reduced to ONE 5 mg tablet taken TWICE daily.  Eliquis may be taken with or without food.   Try to take the dose about the same time in the morning and in the evening. If you have difficulty swallowing the tablet whole please discuss with your pharmacist how to take the medication safely.  Take Eliquis exactly as prescribed and DO NOT stop taking Eliquis without talking to the doctor who prescribed the medication.  Stopping may increase your risk of developing a new blood clot.  Refill your prescription before you run out.  After discharge, you should have regular check-up appointments with your healthcare provider that is prescribing your Eliquis.    What do you do if you miss a dose? If a dose of ELIQUIS is not taken at the scheduled time, take it as soon as possible on the same day and twice-daily administration should be resumed. The dose should not be doubled to make up for a missed dose.  Important Safety Information A possible side effect of Eliquis is bleeding. You should call your healthcare provider right away if you experience any of the following: ? Bleeding from an injury or your nose that does not stop. ? Unusual colored urine (red or dark brown) or unusual colored stools (red or black). ? Unusual bruising for  unknown reasons. ? A serious fall or if you hit your head (even if there is no bleeding).  Some medicines may interact with Eliquis and might increase your risk of bleeding or clotting while on Eliquis. To help avoid this, consult your healthcare provider or pharmacist prior to using any new prescription or non-prescription medications, including herbals, vitamins, non-steroidal anti-inflammatory drugs (NSAIDs) and supplements.  This website has more information on Eliquis (apixaban): http://www.eliquis.com/eliquis/home

## 2019-01-09 NOTE — Discharge Summary (Signed)
. Physician Discharge Summary  [name] HUT:[id number] DOB: [birth year] DOA: 01/03/2019  PCP: [name], MD  Admit date: 01/03/2019 Discharge date: 01/09/2019  Admitted From: Home Disposition:  Discharged to home.  Recommendations for Outpatient Follow-up:  1. Follow up with PCP in 1 week 2. Please obtain BMP/CBC in one week  Discharge Condition: Stable  CODE STATUS: FULL   Brief/Interim Summary: Ronald Baas Wilborneis a 73 y.o.malewith medical history significant forparoxysmal atrial fibrillation no longer on Eliquis after ICH in July 2020, COPD, OSA on CPAP, chronic diastolic CHF, type 2 diabetes mellitus, and seizure disorder after his ICH, now presenting to the emergency department with lower leg pain and confusion. Patient is accompanied by his wife who assists with the history. He has been doing well back at home, ambulating on his own with no appreciable residual weakness from his stroke, but has been unable to ambulate since 01/01/2019 due to bilateral lower leg pain that is much worse on the left. He was also having bilateral leg swelling that improved after increasing his Lasix. His cognitive function remained stable until the afternoon of 9/26/2020when the patient seemed to be confused. He has had some transient episodes of confusion since the [place] in July. EMS was called, patient was found to be saturating in the mid 80s on room air, he was placed on supplemental oxygen, and brought into the ED. He denies any chest pain or palpitations. Denies any melena or hematochezia. Denies headache or acute change in his vision or hearing, or focal numbness or weakness.  10/1: Did well ON. CTH this morning ok.  10/2: Doing well today Denies dyspnea, CP. Says leg pain is improved.   Discharge Diagnoses:  Principal Problem:   Acute pulmonary embolism (HCC) Active Problems:   Type 2 diabetes mellitus with hyperglycemia (HCC)   Paroxysmal atrial fibrillation (HCC)    Seizures ([place]), secondsry to ICH   Acute encephalopathy   CKD (chronic kidney disease), stage III   Acute respiratory failure with hypoxia (HCC)  Submassive PE Acute Hypoxic Resp Failure -Presents with 3 days of bilateral lower leg pain, mainly on left, a few hours of confusion, and was found to be hypoxic with low-normal BP -CTA reveals acute PE in RLL with evidence for right heart strain - PESI class V (male, COPD, confusion, and hypoxia) - 173, very high risk -Discussed treatment with neurology in light of ICH on 10/21/18 -> recommended repeat head CT with follow up head CT in 24-48 hours prior to switching to oral anticoagulation. -Continue heparin gtt - will plan for 72 hrs given high risk and then transition to oral anticoagulation after repeat head CT; he's not therapeutic on heparin yet, would like at least 24 hrs therapeutic prior to CT and transition to oral meds - echo with normal EF - normal RV systolic function (see report) - Consider discussion with hematology consultation given ICH in July regarding which oral anticoagulant - ABG obtained due to hypoxia and confusion, no evidence of hypercarbia - titrate O2 for sat of 90-95. - on RA - 1L; wean as able - CT head in AM before transition to PO anticoag     - CTH ok     - spoke with neurology; ok to resume eliquis given a fib risks and presentation with PE/DVT     - will resume ramp up eliquis; if stable ON, d/c to home on same     - d/c to home on eliquis 10 mg BID through 10/4;  10/5 start 7m BID  Fever Cough - temp to 100.4 on 9/28.  - Likely secondary above. - CXR: without acute findings - UCx with multiple species - Bld Cx NTD - hold abx for now -  resolved  Bilateral leg pain and swelling/gait disturbance Concern for Gout Flare -Patient unable to ambulate since 9/24 due to bilateral lower leg pain, worse on left -There is  swelling and tenderness noted, no evidence for traumatic or infectious etiology -Venous [place] for DVT - colchicine for concern for gout flare; improving - follow uric acid level (5.8)     - resume HH PT at discharge     - continue colchicine at 0.619mBID for flare; review gout ppx options with PCP follow up  History of [place] for [place]F, has been back home now -Head CT in ED with no acute findings, expected contraction of right frontal hematoma with encephalomalacia -Monitor closely while starting anticoagulation; repeat CTH in AM     - CTH ok     - spoke with neurology; ok to resume eliquis given a fib risks and presentation with PE/DVT     - will resume ramp up eliquis; if stable ON, d/c to home on same     - resume HH PT at discharge  Paroxysmal atrial fibrillation -In sinus rhythm on admission -CHADS-VASc is 5 69age, CVA x2, CHF, DM) -He had stopped Eliquis after ICH in July 2020, now on IV heparin as above -Continue amiodarone - see above  CKD stage III -SCr is 1.67, up from 1.25 two weeks earlier after increasing his Lasix -BP is low-normal and there is no pulmonary edema, a small fluid bolus is being given in ED -Renally-dose medications, hold Lasix and losartan initially, monitor - SCr 1.18; monitor   Chronic diastolic CHF -Bilateral LE edema noted but reportedly improved after recent increase in Lasix; there is no pulm edema on CTA -EF was preserved in July 2020 -He has acute PE  - continue to hold BB, losartan, and lasix - I&O, daily wts     - resume home BB, lasix at discharge; hold losartan until seen by PCP  Type II DM -A1c was 6.9% in July 2020 -Managed with metformin and glimepiride at home, held on admission -Use low-intensity SSI with Novolog while  inpatient  Seizures -Developed after ICH, no recent seizure -Continue valproate   Acute encephalopathy  - Patient presents with confusion and bilateral lower leg pain and swelling  - No acute findings on head CT  - delirium precautions - seroquel     - resolved  Hypokalemia Hypomagnesemia - replace and follow     - resolved  Looks good today. Continue Eliquis ramp through Sunday night. Start 55m77mID dosing on Monday. Hold losartan till he sees PCP. Updated wife on plan. Ok [place] discharge.   Discharge Instructions   Allergies as of 01/09/2019   No Known Allergies     Medication List    STOP taking these medications   acetaminophen 325 MG tablet Commonly known as: TYLENOL   allopurinol 300 MG tablet Commonly known as: ZYLOPRIM     TAKE these medications   Accu-Chek Aviva Plus test strip Generic drug: glucose blood Test 2 times daily dx e11.9   Accu-Chek Aviva Plus w/Device Kit Use blood glucose machine as instructed on your strips   accu-chek soft touch lancets Check blood sugars once per day. DX: E11.9   albuterol 108 (  90 Base) MCG/ACT inhaler Commonly known as: Ventolin HFA Inhale 2 puffs into the lungs every 6 (six) hours as needed for wheezing or shortness of breath.   amiodarone 200 MG tablet Commonly known as: PACERONE Take 1 tablet (200 mg total) by mouth daily.   apixaban 5 MG Tabs tablet Commonly known as: ELIQUIS Take 2 tablets (10 mg total) by mouth 2 (two) times daily for 5 doses.   apixaban 5 MG Tabs tablet Commonly known as: ELIQUIS Take 1 tablet (5 mg total) by mouth 2 (two) times daily. Start taking on: January 12, 2019   atorvastatin 10 MG tablet Commonly known as: LIPITOR TAKE 1 TABLET BY MOUTH DAILY EACH EVENING What changed:   how much to take  how to take this  when to take this   azelastine 0.1 % nasal spray Commonly known as: ASTELIN Place 1 spray into both nostrils 2 (two) times daily  as needed for rhinitis or allergies.   budesonide-formoterol 160-4.5 MCG/ACT inhaler Commonly known as: Symbicort Inhale 2 puffs into the lungs 2 (two) times daily.   carvedilol 12.5 MG tablet Commonly known as: COREG Take 1 tablet (12.5 mg total) by mouth 2 (two) times daily.   colchicine 0.6 MG tablet Take 1 tablet (0.6 mg total) by mouth 2 (two) times daily as needed (gout flare).   diclofenac sodium 1 % Gel Commonly known as: VOLTAREN Apply 4 g topically 4 (four) times daily. What changed:   when to take this  reasons to take this   divalproex 500 MG DR tablet Commonly known as: DEPAKOTE Take 1 tablet (500 mg total) by mouth every 12 (twelve) hours.   furosemide 40 MG tablet Commonly known as: LASIX Take 40 mg by mouth 2 (two) times daily with breakfast and lunch.   glimepiride 2 MG tablet Commonly known as: AMARYL Take 1 tablet (2 mg total) by mouth daily with breakfast.   HYDROcodone-acetaminophen 5-325 MG tablet Commonly known as: NORCO/VICODIN Take 1 tablet by mouth every 6 (six) hours as needed for moderate pain.   losartan 25 MG tablet Commonly known as: COZAAR Take 1 tablet (25 mg total) by mouth daily. Start taking on: January 13, 2019 What changed: These instructions start on January 13, 2019. If you are unsure what to do until then, ask your doctor or other care provider.   metFORMIN 500 MG tablet Commonly known as: GLUCOPHAGE Take 1 tablet (500 mg total) by mouth 2 (two) times daily with a meal.   montelukast 10 MG tablet Commonly known as: SINGULAIR Take 1 tablet (10 mg total) by mouth every evening.   PRESCRIPTION MEDICATION Inhale into the lungs at bedtime. CPAP   QUEtiapine 25 MG tablet Commonly known as: SEROQUEL Take 1 tablet (25 mg total) by mouth at bedtime.       No Known Allergies   Procedures/Studies: Ct Head Wo Contrast  Result Date: 01/08/2019 CLINICAL DATA:  History of intracranial haemorrhage, recently started on heparin  drip for PE, intracranial hemorrhage, known, follow-up. EXAM: CT HEAD WITHOUT CONTRAST TECHNIQUE: Contiguous axial images were obtained from the base of the skull through the vertex without intravenous contrast. COMPARISON:  Head CT examinations 01/03/2019 and early FINDINGS: Brain: Developing encephalomalacia/gliosis and likely persistent edema at site of a previous paramedian anterior right frontal lobe parenchymal hemorrhage. No evidence of acute intracranial hemorrhage. No evidence of intracranial mass. No midline shift or extra-axial fluid collection. Stable generalized atrophy and chronic small vessel ischemic disease. Partially empty sella turcica. Vascular:  No hyperdense vessel. Atherosclerotic calcification of the carotid artery siphons and vertebrobasilar system. Skull: Normal. Negative for fracture or focal lesion. Sinuses/Orbits: Visualized orbits demonstrate no acute abnormality. Mild scattered paranasal sinus mucosal thickening. No significant mastoid effusion. IMPRESSION: 1. Developing encephalomalacia/gliosis and likely persistent edema at site of a previous paramedian anterior right frontal lobe parenchymal hemorrhage. No evidence of acute intracranial hemorrhage. 2. Stable generalized atrophy and chronic small vessel ischemic disease. Electronically Signed   By: Kellie Simmering   On: 01/08/2019 09:11   Ct Head Wo Contrast  Result Date: 01/03/2019 CLINICAL DATA:  Altered level of consciousness. EXAM: CT HEAD WITHOUT CONTRAST TECHNIQUE: Contiguous axial images were obtained from the base of the skull through the vertex without intravenous contrast. COMPARISON:  Head CT and brain MRI 11/22/2018 FINDINGS: Brain: Expected contraction of subacute parafalcine right frontal hematoma with developing encephalomalacia. No new or acute hemorrhage. No evidence of acute ischemia. No hydrocephalus. No midline shift. Stable generalized cerebral atrophy and chronic small vessel ischemia. Vascular: Atherosclerosis  of skullbase vasculature without hyperdense vessel or abnormal calcification. Skull: No fracture or focal lesion. Sinuses/Orbits: Paranasal sinuses and mastoid air cells are clear. The visualized orbits are unremarkable. Bilateral lens extraction. Other: None. IMPRESSION: 1. No acute intracranial abnormality. 2. Expected contraction of subacute parafalcine right frontal hematoma over the last 6 weeks with encephalomalacia. 3. Stable atrophy and chronic small vessel ischemia. Electronically Signed   By: Keith Rake M.D.   On: 01/03/2019 21:45   Ct Angio Chest Pe W And/or Wo Contrast  Addendum Date: 01/04/2019   ADDENDUM REPORT: 01/04/2019 00:50 ADDENDUM: Dilation of the ascending thoracic aorta to 4.1 cm. Recommend annual imaging followup by CTA or MRA. This recommendation follows 2010 ACCF/AHA/AATS/ACR/ASA/SCA/SCAI/SIR/STS/SVM Guidelines for the Diagnosis and Management of Patients with Thoracic Aortic Disease. Circulation. 2010; 121: K800-L491. Aortic aneurysm NOS (ICD10-I71.9) Electronically Signed   By: Lovena Le M.D.   On: 01/04/2019 00:50   Result Date: 01/04/2019 CLINICAL DATA:  Altered mental status, starting mid day, abdominal pain and distension, PE suspected, positive D-dimer EXAM: CT ANGIOGRAPHY CHEST WITH CONTRAST TECHNIQUE: Multidetector CT imaging of the chest was performed using the standard protocol during bolus administration of intravenous contrast. Multiplanar CT image reconstructions and MIPs were obtained to evaluate the vascular anatomy. CONTRAST:  59m OMNIPAQUE IOHEXOL 350 MG/ML SOLN COMPARISON:  CT chest 01/31/2015 FINDINGS: Cardiovascular: Satisfactory opacification of pulmonary arteries to the segmental level. Filling defects are present in the pulmonary arteries of the superior segment and posterior basal segment of the right lower lobe. No other visible pulmonary artery filling defects are evident. There is central pulmonary arterial enlargement, increased from comparison  study. There is borderline elevation of the RV/LV ratio (0.95) with flattening of the intraventricular septum. Borderline cardiomegaly. No pericardial effusion. Ascending aorta is dilated to 4.1 cm. There is atherosclerotic calcification of the aortic arch and proximal great vessels. Suboptimal contrast opacification of the aorta limits luminal evaluation. Mediastinum/Nodes: No enlarged mediastinal or axillary lymph nodes. Thyroid gland, trachea, and esophagus demonstrate no significant findings. Lungs/Pleura: Lung volumes are diminished with extensive areas of atelectasis throughout both lungs. More subpleural reticulation is noted in the upper lobes with some peribronchial thickening and bronchiectatic changes throughout both lungs. Subpleural cyst noted in the right lower lung. A previously seen 5 mm nodule in the left lower lobe is poorly visualized on today's exam due to dependent atelectasis in the lung base. Lobular 12 mm nodule in the lingula associated with a bandlike area of scarring may  reflect rounded atelectasis, unchanged from prior. Upper Abdomen: No acute abnormalities present in the visualized portions of the upper abdomen. Abdominal aortic atherosclerosis is evident. Musculoskeletal: No acute osseous abnormality or suspicious osseous lesion. Multilevel degenerative changes are present in the imaged portions of the spine. Review of the MIP images confirms the above findings. IMPRESSION: 1. Positive for acute PE with CT evidence of right heart strain (RV/LV Ratio = 0.95, flattening of the intraventricular septum, central pulmonary artery enlargement) consistent with at least submassive (intermediate risk) PE. The presence of right heart strain has been associated with an increased risk of morbidity and mortality. Please activate Code PE by paging [phone]. 2. Extensive areas of atelectasis likely accentuated by imaging during exhalation. 3. Suspect a region of rounded atelectasis or scarring  within the lingula, stable from comparison exam in 2019. 4.  Aortic Atherosclerosis (ICD10-I70.0).  Coronary atherosclerosis. Critical Value/emergent results were called by telephone at the time of interpretation on 01/04/2019 at 12:05 am to providerCardama MD, who verbally acknowledged these results. Electronically Signed: By: Lovena Le M.D. On: 01/04/2019 00:08   Dg Chest Port 1 View  Result Date: 01/06/2019 CLINICAL DATA:  Fevers, known pulmonary emboli EXAM: PORTABLE CHEST 1 VIEW COMPARISON:  01/03/2019 FINDINGS: Cardiac shadow is mildly prominent. Lungs are well aerated bilaterally. No focal infiltrate or sizable effusion is seen. The overall appearance is stable from the prior exam. IMPRESSION: No acute abnormality noted. Electronically Signed   By: Inez Catalina M.D.   On: 01/06/2019 09:53   Dg Chest Port 1 View  Result Date: 01/03/2019 CLINICAL DATA:  Altered mental status. EXAM: PORTABLE CHEST 1 VIEW COMPARISON:  Chest radiograph 07/26/2017. There is a lesion chest CT 03/17/2018. FINDINGS: Low lung volumes. Cardiomegaly. No obvious pulmonary edema. Left basilar evaluation is limited due to soft tissue attenuation. No evidence of acute airspace disease or pneumothorax. IMPRESSION: 1. Low lung volumes limit assessment, particularly at the left lung base. 2. Cardiomegaly. Electronically Signed   By: Keith Rake M.D.   On: 01/03/2019 21:13   Vas [place] Lower Extremity Venous (dvt)  Result Date: 01/05/2019  Lower Venous Study Indications: Pain, Swelling, and pulmonary embolism.  Risk Factors: Confirmed PE. Limitations: Body habitus and movement of the legs. Performing Technologist: Toma Copier RVS  Examination Guidelines: A complete evaluation includes B-mode imaging, spectral Doppler, color Doppler, and power Doppler as needed of all accessible portions of each vessel. Bilateral testing is considered an integral part of a complete examination. Limited examinations for reoccurring  indications may be performed as noted.  +---------+---------------+---------+-----------+----------+--------------+ RIGHT    CompressibilityPhasicitySpontaneityPropertiesThrombus Aging +---------+---------------+---------+-----------+----------+--------------+ CFV      Full           Yes      Yes                                 +---------+---------------+---------+-----------+----------+--------------+ SFJ      Full                                                        +---------+---------------+---------+-----------+----------+--------------+ FV Prox  Full           Yes      Yes                                 +---------+---------------+---------+-----------+----------+--------------+  FV Mid   Full                                                        +---------+---------------+---------+-----------+----------+--------------+ FV DistalFull           Yes      Yes                                 +---------+---------------+---------+-----------+----------+--------------+ PFV      Full           Yes      Yes                                 +---------+---------------+---------+-----------+----------+--------------+ POP      Full           Yes      Yes                                 +---------+---------------+---------+-----------+----------+--------------+ PTV      Full                                                        +---------+---------------+---------+-----------+----------+--------------+ PERO     Full                                                        +---------+---------------+---------+-----------+----------+--------------+   +---------+---------------+---------+-----------+----------+--------------+ LEFT     CompressibilityPhasicitySpontaneityPropertiesThrombus Aging +---------+---------------+---------+-----------+----------+--------------+ CFV      Full           Yes      Yes                                  +---------+---------------+---------+-----------+----------+--------------+ SFJ      Full                                                        +---------+---------------+---------+-----------+----------+--------------+ FV Prox  Full           Yes      Yes                                 +---------+---------------+---------+-----------+----------+--------------+ FV Mid   Full                                                        +---------+---------------+---------+-----------+----------+--------------+ FV  DistalFull           Yes      Yes                                 +---------+---------------+---------+-----------+----------+--------------+ PFV      Full           Yes      Yes                                 +---------+---------------+---------+-----------+----------+--------------+ POP      Full           Yes      Yes                                 +---------+---------------+---------+-----------+----------+--------------+ PTV      Full                                                        +---------+---------------+---------+-----------+----------+--------------+ PERO     Full                                                        +---------+---------------+---------+-----------+----------+--------------+     Summary: Right: There is no evidence of deep vein thrombosis in the lower extremity. No cystic structure found in the popliteal fossa. Left: There is no evidence of deep vein thrombosis in the lower extremity. No cystic structure found in the popliteal fossa.  *See table(s) above for measurements and observations. Electronically signed by Ruta Hinds MD on 01/05/2019 at 4:10:46 PM.    Final       Subjective: "I thank you for all your help."  Discharge Exam: Vitals:   01/08/19 2040 01/09/19 0644  BP: 135/84 135/81  Pulse: 78 77  Resp: 19 14  Temp: 98.5 F (36.9 C) 98.9 F (37.2 C)  SpO2: 99% 92%   Vitals:   01/08/19 1941  01/08/19 1943 01/08/19 2040 01/09/19 0644  BP:   135/84 135/81  Pulse:  78 78 77  Resp:  _0 Temp:   98.5 F (36.9 C) 98.9 F (37.2 C)  TempSrc:   Oral Oral  SpO2: 98% 98% 99% 92%  Weight:    97.8 kg  Height:        General:73 y.o.maleresting in bed in NAD Eyes: PERRL, normal sclera ENMT: Nares patent w/o discharge, orophaynx clear, dentition normal, ears w/o discharge/lesions/ulcers Cardiovascular: RRR, +S1, S2, no m/g/r, equal pulses throughout Respiratory: CTABL, no w/r/r, normal WOB GI: BS+, NDNT, no masses noted, no organomegaly noted MSK: No e/c/c; LLE pain improved Neuro: A&O x 3, no focal deficits Psyc: appropriate interaction, affect, calm/cooperative    The results of significant diagnostics from this hospitalization (including imaging, microbiology, ancillary and laboratory) are listed below for reference.     Microbiology: Recent Results (from the past 240 hour(s))  Blood Culture (routine x 2)     Status: None   Collection Time: 01/03/19  8:56 PM  Specimen: BLOOD  Result Value Ref Range Status   Specimen Description BLOOD RIGHT ARM  Final   Special Requests   Final    BOTTLES DRAWN AEROBIC AND ANAEROBIC Blood Culture results may not be optimal due to an excessive volume of blood received in culture bottles   Culture   Final    NO GROWTH 5 DAYS Performed at [facility], [place] [address], [place], [place] [address]    Report Status 01/08/2019 FINAL  Final  Blood Culture (routine x 2)     Status: None   Collection Time: 01/03/19  8:58 PM   Specimen: BLOOD  Result Value Ref Range Status   Specimen Description BLOOD RIGHT ARM  Final   Special Requests   Final    BOTTLES DRAWN AEROBIC AND ANAEROBIC Blood Culture results may not be optimal due to an excessive volume of blood received in culture bottles   Culture   Final    NO GROWTH 5 DAYS Performed at [facility], [place] [address] [place], [place] [address]    Report Status 01/08/2019  FINAL  Final  Urine culture     Status: Abnormal   Collection Time: 01/03/19  9:24 PM   Specimen: Urine, Clean Catch  Result Value Ref Range Status   Specimen Description URINE, CLEAN CATCH  Final   Special Requests   Final    NONE Performed at [facility], [place] [address], [place],  [address]    Culture MULTIPLE SPECIES PRESENT, SUGGEST RECOLLECTION (A)  Final   Report Status 01/05/2019 FINAL  Final  SARS CORONAVIRUS 2 (TAT 6-24 HRS) Nasopharyngeal Nasopharyngeal Swab     Status: None   Collection Time: 01/03/19  9:38 PM   Specimen: Nasopharyngeal Swab  Result Value Ref Range Status   SARS Coronavirus 2 NEGATIVE NEGATIVE Final    Comment: (NOTE) SARS-CoV-2 target nucleic acids are NOT DETECTED. The SARS-CoV-2 RNA is generally detectable in upper and lower respiratory specimens during the acute phase of infection. Negative results do not preclude SARS-CoV-2 infection, do not rule out co-infections with other pathogens, and should not be used as the sole basis for treatment or other patient management decisions. Negative results must be combined with clinical observations, patient history, and epidemiological information. The expected result is Negative. Fact Sheet for Patients: SugarRoll.be Fact Sheet for Healthcare Providers: https://www.woods-mathews.com/ This test is not yet approved or cleared by the [place] FDA and  has been authorized for detection and/or diagnosis of SARS-CoV-2 by FDA under an Emergency Use Authorization (EUA). This EUA will remain  in effect (meaning this test can be used) for the duration of the COVID-19 declaration under Section 56 4(b)(1) of the Act, 21 U.S.C. section 360bbb-3(b)(1), unless the authorization is terminated or revoked sooner. Performed at [facility], [place] [address] [place],  [address]      Labs: BNP (last 3 results) Recent Labs    01/03/19 2053  BNP  397.6*   Basic Metabolic Panel: Recent Labs  Lab 01/05/19 0531 01/06/19 0617 01/07/19 0334 01/08/19 0334 01/09/19 0421  NA 136 135 138 138 139  K 3.2* 3.5 3.3* 3.5 4.0  CL 99 101 103 105 108  CO2 _0 GLUCOSE 135* 146* 144* 157* 147*  BUN _1 CREATININE 1.50* 1.27* 1.25* 1.21 1.18  CALCIUM 8.5* 8.1* 8.2* 8.3* 8.2*  MG 2.1 1.8 1.8 1.6* 1.7  PHOS  --   --   --  3.1 2.9   Liver Function Tests: Recent Labs  Lab 01/03/19 2053 01/05/19 0531 01/06/19 0617 01/07/19 0334 01/08/19 0334 01/09/19 0421  AST _0 33  --   --   ALT _1 --   --   ALKPHOS 75 77 83 89  --   --   BILITOT 0.9 1.0 0.8 0.5  --   --   PROT 5.6* 5.6* 4.9* 5.0*  --   --   ALBUMIN 2.7* 2.4* 2.0* 2.0* 2.0* 2.1*   No results for input(s): LIPASE, AMYLASE in the last 168 hours. No results for input(s): AMMONIA in the last 168 hours. CBC: Recent Labs  Lab 01/03/19 2053  01/05/19 0531 01/06/19 0617 01/06/19 1434 01/07/19 0334 01/08/19 0334 01/09/19 0421  WBC 6.0   < > 6.0 5.9  --  5.8 5.5 5.6  NEUTROABS 4.1  --   --   --   --   --   --   --   HGB 11.3*   < > 11.0* 9.8* 9.5* 9.6* 9.5* 10.1*  HCT 34.4*   < > 32.4* 29.3* 28.8* 30.0* 29.9* 30.2*  MCV 99.7   < > 97.0 97.3  --  99.0 99.3 98.1  PLT 243   < > 247 266  --  290 324 338   < > = values in this interval not displayed.   Cardiac Enzymes: No results for input(s): CKTOTAL, CKMB, CKMBINDEX, TROPONINI in the last 168 hours. BNP: Invalid input(s): POCBNP CBG: Recent Labs  Lab 01/07/19 2057 01/08/19 0727 01/08/19 1115 01/08/19 1613 01/08/19 2143  GLUCAP 157* 134* 209* 193* 144*   D-Dimer No results for input(s): DDIMER in the last 72 hours. Hgb A1c No results for input(s): HGBA1C in the last 72 hours. Lipid Profile No results for input(s): CHOL, HDL, LDLCALC, TRIG, CHOLHDL, LDLDIRECT in the last 72 hours. Thyroid function studies No results for input(s): TSH, T4TOTAL, T3FREE, THYROIDAB in the last 72  hours.  Invalid input(s): FREET3 Anemia work up No results for input(s): VITAMINB12, FOLATE, FERRITIN, TIBC, IRON, RETICCTPCT in the last 72 hours. Urinalysis    Component Value Date/Time   COLORURINE YELLOW 01/03/2019 2115   APPEARANCEUR CLEAR 01/03/2019 2115   LABSPEC 1.009 01/03/2019 2115   PHURINE 6.0 01/03/2019 2115   GLUCOSEU NEGATIVE 01/03/2019 2115   HGBUR SMALL (A) 01/03/2019 2115   BILIRUBINUR NEGATIVE 01/03/2019 2115   BILIRUBINUR n 12/23/2018 1416   KETONESUR NEGATIVE 01/03/2019 2115   PROTEINUR NEGATIVE 01/03/2019 2115   UROBILINOGEN 0.2 12/23/2018 1416   UROBILINOGEN 1.0 05/04/2014 1123   NITRITE NEGATIVE 01/03/2019 2115   LEUKOCYTESUR SMALL (A) 01/03/2019 2115   Sepsis Labs Invalid input(s): PROCALCITONIN,  WBC,  LACTICIDVEN Microbiology Recent Results (from the past 240 hour(s))  Blood Culture (routine x 2)     Status: None   Collection Time: 01/03/19  8:56 PM   Specimen: BLOOD  Result Value Ref Range Status   Specimen Description BLOOD RIGHT ARM  Final   Special Requests   Final    BOTTLES DRAWN AEROBIC AND ANAEROBIC Blood Culture results may not be optimal due to an excessive volume of blood received in culture bottles   Culture   Final    NO GROWTH 5 DAYS Performed at [facility], [place] [address], [place], [place] [address]    Report Status 01/08/2019 FINAL  Final  Blood Culture (routine x 2)     Status: None   Collection Time: 01/03/19  8:58 PM   Specimen: BLOOD  Result Value Ref Range Status   Specimen Description BLOOD RIGHT ARM  Final   Special Requests   Final    BOTTLES DRAWN AEROBIC AND ANAEROBIC Blood Culture results may not be optimal due to an excessive volume of blood received in culture bottles   Culture   Final    NO GROWTH 5 DAYS Performed at [facility], [place] [address], [place], [place] [address]    Report Status 01/08/2019 FINAL  Final  Urine culture     Status: Abnormal   Collection Time: 01/03/19  9:24 PM    Specimen: Urine, Clean Catch  Result Value Ref Range Status   Specimen Description URINE, CLEAN CATCH  Final   Special Requests   Final    NONE Performed at [facility], [place] [address], [place], [place] [address]    Culture MULTIPLE SPECIES PRESENT, SUGGEST RECOLLECTION (A)  Final   Report Status 01/05/2019 FINAL  Final  SARS CORONAVIRUS 2 (TAT 6-24 HRS) Nasopharyngeal Nasopharyngeal Swab     Status: None   Collection Time: 01/03/19  9:38 PM   Specimen: Nasopharyngeal Swab  Result Value Ref Range Status   SARS Coronavirus 2 NEGATIVE NEGATIVE Final    Comment: (NOTE) SARS-CoV-2 target nucleic acids are NOT DETECTED. The SARS-CoV-2 RNA is generally detectable in upper and lower respiratory specimens during the acute phase of infection. Negative results do not preclude SARS-CoV-2 infection, do not rule out co-infections with other pathogens, and should not be used as the sole basis for treatment or other patient management decisions. Negative results must be combined with clinical observations, patient history, and epidemiological information. The expected result is Negative. Fact Sheet for Patients: SugarRoll.be Fact Sheet for Healthcare Providers: https://www.woods-mathews.com/ This test is not yet approved or cleared by the [place] FDA and  has been authorized for detection and/or diagnosis of SARS-CoV-2 by FDA under an Emergency Use Authorization (EUA). This EUA will remain  in effect (meaning this test can be used) for the duration of the COVID-19 declaration under Section 56 4(b)(1) of the Act, 21 U.S.C. section 360bbb-3(b)(1), unless the authorization is terminated or revoked sooner. Performed at [facility], [place] [address], [place], [place] [address]      Time coordinating discharge: 35 minutes  SIGNED:   Jonnie Finner, DO  [facility] 01/09/2019, 7:38 AM Pager   If 7PM-7AM, please contact  night-coverage [email or web address] Password [email or web address]

## 2019-01-09 NOTE — Consult Note (Signed)
   [facility] Inpatient Consult   01/09/2019  Ronald [name] [birth year] [id number]    Patientwas checked for potential [facility], with a 48% extreme high risk score for unplanned readmission, has 4 hospitalizations and 1 ED in the past 6 months.   Review of medical record and MD history and physical on 01/04/19 reveal as:   Ronald [name] is a 73 y.o. male with medical history significant for paroxysmal atrial fibrillation no longer on Eliquis after ICH in July 2020, COPD, OSA on CPAP, chronic diastolic CHF, type 2 diabetes mellitus, and seizure disorder after his ICH,    now presenting to the emergency department with lower leg pain and confusion. Has been unable to ambulate since 01/01/2019 due to bilateral lower leg pain that is much worse on the left and having bilateral leg swelling that improved after increasing his Lasix.      On 01/03/2019, when patient seemed to be confused, had some transient episodes of confusion since the [place] in July. EMS was called and patient was found to be saturating in the mid 80s on room air, he was placed on supplemental oxygen, and brought into the ED.   [Acute (submassive) pulmonary embolism, Acute respiratory failure with hypoxia]    Primary Care Provider is Dr. Carolann Littler with [facility] at [place], listed to provide transition of care follow-up.  Transition of care CM note states that prior to admission, patient is active with [facility] for RN, PT, OT. Discharge to home with resumption of [facility] services.  Patient transitionedto home prior to speaking withhim/ wife.  Plan:Will followpatientwith EMMI Generalcalls tofollow-uprecovery.    For questionsand additional information,please call:  Lavon Horn A. Tracina Beaumont, BSN, RN-BC [facility] Liaison Cell: [phone]

## 2019-01-11 ENCOUNTER — Other Ambulatory Visit: Payer: Self-pay | Admitting: Family Medicine

## 2019-01-12 ENCOUNTER — Telehealth: Payer: Self-pay | Admitting: Family Medicine

## 2019-01-12 NOTE — Telephone Encounter (Signed)
Need to postpone pt's visit until 10.6.20 for Physical Therapy. Per pt request

## 2019-01-13 ENCOUNTER — Other Ambulatory Visit: Payer: Self-pay

## 2019-01-13 ENCOUNTER — Encounter: Payer: Self-pay | Admitting: Neurology

## 2019-01-13 ENCOUNTER — Ambulatory Visit (INDEPENDENT_AMBULATORY_CARE_PROVIDER_SITE_OTHER): Payer: Medicare Other | Admitting: Neurology

## 2019-01-13 VITALS — BP 112/64 | HR 63 | Temp 97.3°F | Ht 71.0 in | Wt 213.3 lb

## 2019-01-13 DIAGNOSIS — R569 Unspecified convulsions: Secondary | ICD-10-CM

## 2019-01-13 DIAGNOSIS — Z7984 Long term (current) use of oral hypoglycemic drugs: Secondary | ICD-10-CM | POA: Diagnosis not present

## 2019-01-13 DIAGNOSIS — E785 Hyperlipidemia, unspecified: Secondary | ICD-10-CM | POA: Diagnosis not present

## 2019-01-13 DIAGNOSIS — G9340 Encephalopathy, unspecified: Secondary | ICD-10-CM | POA: Diagnosis not present

## 2019-01-13 DIAGNOSIS — J449 Chronic obstructive pulmonary disease, unspecified: Secondary | ICD-10-CM | POA: Diagnosis not present

## 2019-01-13 DIAGNOSIS — I69198 Other sequelae of nontraumatic intracerebral hemorrhage: Secondary | ICD-10-CM | POA: Diagnosis not present

## 2019-01-13 DIAGNOSIS — I5032 Chronic diastolic (congestive) heart failure: Secondary | ICD-10-CM | POA: Diagnosis not present

## 2019-01-13 DIAGNOSIS — Z5181 Encounter for therapeutic drug level monitoring: Secondary | ICD-10-CM | POA: Diagnosis not present

## 2019-01-13 DIAGNOSIS — Z79891 Long term (current) use of opiate analgesic: Secondary | ICD-10-CM | POA: Diagnosis not present

## 2019-01-13 DIAGNOSIS — M8938 Hypertrophy of bone, other site: Secondary | ICD-10-CM | POA: Diagnosis not present

## 2019-01-13 DIAGNOSIS — I480 Paroxysmal atrial fibrillation: Secondary | ICD-10-CM | POA: Diagnosis not present

## 2019-01-13 DIAGNOSIS — M5030 Other cervical disc degeneration, unspecified cervical region: Secondary | ICD-10-CM | POA: Diagnosis not present

## 2019-01-13 DIAGNOSIS — G40909 Epilepsy, unspecified, not intractable, without status epilepticus: Secondary | ICD-10-CM | POA: Diagnosis not present

## 2019-01-13 DIAGNOSIS — I610 Nontraumatic intracerebral hemorrhage in hemisphere, subcortical: Secondary | ICD-10-CM

## 2019-01-13 DIAGNOSIS — E1122 Type 2 diabetes mellitus with diabetic chronic kidney disease: Secondary | ICD-10-CM | POA: Diagnosis not present

## 2019-01-13 DIAGNOSIS — N183 Chronic kidney disease, stage 3 unspecified: Secondary | ICD-10-CM | POA: Diagnosis not present

## 2019-01-13 DIAGNOSIS — I700 Atherosclerosis of aorta: Secondary | ICD-10-CM | POA: Diagnosis not present

## 2019-01-13 DIAGNOSIS — I712 Thoracic aortic aneurysm, without rupture: Secondary | ICD-10-CM | POA: Diagnosis not present

## 2019-01-13 DIAGNOSIS — Z9181 History of falling: Secondary | ICD-10-CM | POA: Diagnosis not present

## 2019-01-13 DIAGNOSIS — I130 Hypertensive heart and chronic kidney disease with heart failure and stage 1 through stage 4 chronic kidney disease, or unspecified chronic kidney disease: Secondary | ICD-10-CM | POA: Diagnosis not present

## 2019-01-13 DIAGNOSIS — Z9981 Dependence on supplemental oxygen: Secondary | ICD-10-CM | POA: Diagnosis not present

## 2019-01-13 DIAGNOSIS — Z7951 Long term (current) use of inhaled steroids: Secondary | ICD-10-CM | POA: Diagnosis not present

## 2019-01-13 DIAGNOSIS — M1030 Gout due to renal impairment, unspecified site: Secondary | ICD-10-CM | POA: Diagnosis not present

## 2019-01-13 MED ORDER — DIVALPROEX SODIUM 500 MG PO DR TAB: 500.0000 mg | DELAYED_RELEASE_TABLET | Freq: Two times a day (BID) | ORAL | 1 refills | Status: DC

## 2019-01-13 NOTE — Progress Notes (Signed)
° ° °Reason for visit: Encephalopathy, history of intracranial hemorrhage, seizure ° °Referring physician: Kimball ° °Ronald [name] is a 73 y.o. male ° °History of present illness: ° °Ronald [name] is a 73-year-old left-handed white male with a history of atrial fibrillation on chronic anticoagulant therapy.  The patient was on Eliquis when he was admitted to the hospital on 21 October 2018.  The patient was noted to have a seizure at home with EMS, when taken to the hospital he was found to have a right frontal intracranial hemorrhage.  The patient was taken off of Eliquis, he initially did fairly well following the stroke event.  He was able to walk and get around without difficulty.  The patient was readmitted to the hospital on 21 November 2018 with increased confusion.  The patient has been placed on Keppra during the first admission but it was felt at that time that the Keppra was leading to some increased confusion, but he was also noted to have a urinary tract infection around this time.  The patient was switched to Depakote and Seroquel was started.  The patient required some inpatient rehab, and he went home after that.  He returned to the hospital again on 03 January 2019 with increased confusion, but his oxygen saturations were in the mid 80s at that time, he was found to have a pulmonary embolism but venous Doppler studies of both lower extremities were negative even though he had swelling in both feet.  He had severe pain in the feet and legs, he was thought to possibly have a gout attack.  On blood work however, it has been noted that his albumin level around the time of the first admission and July was normal, but at this point, the most recent albumin level on 07 January 2019 was 2.0.  The patient continues to have swelling in the feet, the swelling goes down overnight when he props his feet up.  He denies any headache, he reports no residual numbness or weakness, he does have some slight  imbalance but he is using a cane.  He will be starting home health physical therapy in the near future.  The patient does report some urinary urgency but no incontinence.  His wife indicates that there is some slight short-term memory issues but his cognitive status has improved greatly since the admission on 03 January 2019. 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)   ° followed by dr harding  °• COPD with emphysema (HCC)   ° pulmologist-  dr sood  °• Dyspnea   ° occasional per pt  °• Heart disease   °• History of colon polyps   ° tubular adenoma 2013  °• History of gout   ° 09-05-2017 last flare-up  05/ 2019 3 wks ago, feet  °• History of sepsis 02/18/2017  ° per d/c note probable uti, acute chf, acute renal failure, hypoxia  °• Hyperlipidemia   °• Hyperplasia of prostate with lower urinary tract symptoms (LUTS)   °• Hypertension   °• OSA on CPAP   ° per study 08-03-2004  Severe OSA  °• Persistent atrial fibrillation (HCC)   ° cardiologist --  dr paula ross--  post cardioversion 05-07-2014  °• Prostate cancer (HCC) urologsit-  dr mckenzie--- as of 05-21-2017 per pt last PSA 11 approx.  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)   ° pulmologist-  dr sood  °• Seasonal allergies   °• Sigmoid diverticulosis   °• Systolic and diastolic CHF, chronic (HCC)   ° cardiologist-  dr harding  °• Tinea versicolor   °• Type 2 diabetes mellitus (HCC)   °• Wears hearing aid in both ears   ° ° °Past Surgical History:  °Procedure Laterality Date  °• CARDIOVERSION N/A 05/07/2014  ° Procedure: CARDIOVERSION;  Surgeon: Paula Ross V, MD;  Location: [facility];  Service: Cardiovascular;  Laterality: N/A;  °•  CARDIOVERSION N/A 09/09/2014  ° Procedure: CARDIOVERSION;  Surgeon: Brian S Crenshaw, MD;  Location: [facility];  Service: Cardiovascular;  Laterality: N/A;  successfully  °• CATARACT EXTRACTION W/ INTRAOCULAR LENS  IMPLANT, BILATERAL  08 and 09/  2018  °• COLONOSCOPY  last one 06-07-2011  °• GOLD SEED IMPLANT N/A 09/09/2017  ° Procedure: GOLD SEED IMPLANT;  Surgeon: McKenzie, Patrick L, MD;  Location: [facility];  Service: Urology;  Laterality: N/A;  °• HYDROCELE EXCISION Bilateral 09/26/2015  ° Procedure: HYDROCELECTOMY ADULT;  Surgeon: Patrick L McKenzie, MD;  Location: [facility];  Service: Urology;  Laterality: Bilateral;  °• LAMINECTOMY AND MICRODISCECTOMY LUMBAR SPINE  12-23-2003  ° Left L5 -- S1  °• LAPAROSCOPIC BILATERAL INGUINAL HERNIA REPAIR/  UMBILICAL HERNIA REPAIR WITH MESH/  ASPIRATION LEFT HYDROCELE  07-11-2012  °• NM MYOVIEW LTD  05/18/2014  ° Low risk study. Normal perfusion: No ischemia or infarction. Mild LV dysfunction - 46% (does not correlate with echocardiographic EF of 50-55%)  °• PROSTATE BIOPSY  05/15/12  ° Clinically both [place]  °• SPACE OAR INSTILLATION N/A 09/09/2017  ° Procedure: SPACE OAR INSTILLATION;  Surgeon: McKenzie, Patrick L, MD;  Location: [facility];  Service: Urology;  Laterality: N/A;  °• TEE WITHOUT CARDIOVERSION N/A 05/07/2014  ° Procedure: TRANSESOPHAGEAL ECHOCARDIOGRAM (TEE);  Surgeon: Paula Ross V, MD;  Location: [facility];  Service: Cardiovascular;  Laterality: N/A;   mild atherosclerosis plaque of aorta,  mild AR, MR, and TR,  no cardiac source of emboli  °• TONSILLECTOMY  as child  °• TRANSTHORACIC ECHOCARDIOGRAM  11/'18; 1/'19  ° a) In setting of sepsis: EF of 40-45%.  Diffuse hypokinesis.  No RWMA.  Biatrial enlargement.;; b) ** f/u Jan 2019: Normal LVF 55-60%** , mildly dilated aortic root(38 mm) and ascending aorta (44 mm). Compared to prior echo, LVEF has improved.  °• TRANSURETHRAL RESECTION OF PROSTATE N/A 05/30/2017    ° Procedure: TRANSURETHRAL RESECTION OF THE PROSTATE (TURP);  Surgeon: McKenzie, Patrick L, MD;  Location: [facility];  Service: Urology;  Laterality: N/A;  ° ° °Family History  °Problem Relation Age of Onset  °• Breast cancer Mother   °• Stroke Other   °     Unknown   °• Ovarian cancer Sister   °• Colon cancer Neg Hx   °• Esophageal cancer Neg Hx   °• Rectal cancer Neg Hx   °• Stomach cancer Neg Hx   ° ° °Social history:  reports that he has been smoking cigarettes. He has a 15.00 pack-year smoking history. He has never used smokeless tobacco. He reports current alcohol use of about 14.0 standard drinks of alcohol per week. He reports that he does not use drugs. ° °Medications:  °  Prior to Admission medications   Medication Sig Start Date End Date Taking? Authorizing Provider  albuterol (VENTOLIN HFA) 108 (90 Base) MCG/ACT inhaler Inhale 2 puffs into the lungs every 6 (six) hours as needed for wheezing or shortness of breath. 03/12/18  Yes Chesley Mires, MD  amiodarone (PACERONE) 200 MG tablet Take 1 tablet (200 mg total) by mouth daily. 12/12/18  Yes Angiulli, Lavon Paganini, PA-C  apixaban (ELIQUIS) 5 MG TABS tablet Take 1 tablet (5 mg total) by mouth 2 (two) times daily. 01/12/19 02/11/19 Yes Kyle, Tyrone A, DO  atorvastatin (LIPITOR) 10 MG tablet TAKE 1 TABLET BY MOUTH DAILY EACH EVENING Patient taking differently: Take 10 mg by mouth every evening. TAKE 1 TABLET BY MOUTH DAILY EACH EVENING 12/12/18  Yes Angiulli, Lavon Paganini, PA-C  azelastine (ASTELIN) 0.1 % nasal spray Place 1 spray into both nostrils 2 (two) times daily as needed for rhinitis or allergies. 12/12/18  Yes Angiulli, Lavon Paganini, PA-C  Blood Glucose Monitoring Suppl (ACCU-CHEK AVIVA PLUS) w/Device KIT Use blood glucose machine as instructed on your strips 12/12/18  Yes Angiulli, Lavon Paganini, PA-C  budesonide-formoterol (SYMBICORT) 160-4.5 MCG/ACT inhaler Inhale 2 puffs into the lungs 2 (two) times daily. 12/12/18  Yes Angiulli, Lavon Paganini, PA-C  carvedilol (COREG) 12.5  MG tablet Take 1 tablet (12.5 mg total) by mouth 2 (two) times daily. 12/12/18 03/12/19 Yes Angiulli, Lavon Paganini, PA-C  colchicine 0.6 MG tablet Take 1 tablet (0.6 mg total) by mouth 2 (two) times daily as needed (gout flare). 12/12/18  Yes Angiulli, Lavon Paganini, PA-C  diclofenac sodium (VOLTAREN) 1 % GEL Apply 4 g topically 4 (four) times daily. Patient taking differently: Apply 4 g topically 4 (four) times daily as needed (foot pain).  12/12/18  Yes Angiulli, Lavon Paganini, PA-C  divalproex (DEPAKOTE) 500 MG DR tablet Take 1 tablet (500 mg total) by mouth every 12 (twelve) hours. 12/12/18  Yes Angiulli, Lavon Paganini, PA-C  furosemide (LASIX) 40 MG tablet Take 40 mg by mouth 2 (two) times daily with breakfast and lunch.    Yes [provider]  glimepiride (AMARYL) 2 MG tablet Take 1 tablet (2 mg total) by mouth daily with breakfast. 12/13/18  Yes Angiulli, Lavon Paganini, PA-C  glucose blood (ACCU-CHEK AVIVA PLUS) test strip Test 2 times daily dx e11.9 12/12/18  Yes Angiulli, Lavon Paganini, PA-C  HYDROcodone-acetaminophen (NORCO/VICODIN) 5-325 MG tablet Take 1 tablet by mouth every 6 (six) hours as needed for moderate pain.   Yes [provider]  Lancets (ACCU-CHEK SOFT TOUCH) lancets Check blood sugars once per day. DX: E11.9 12/12/18  Yes Angiulli, Lavon Paganini, PA-C  losartan (COZAAR) 25 MG tablet Take 1 tablet (25 mg total) by mouth daily. 01/13/19  Yes Kyle, Tyrone A, DO  metFORMIN (GLUCOPHAGE) 500 MG tablet Take 1 tablet (500 mg total) by mouth 2 (two) times daily with a meal. 12/31/18  Yes Burchette, Alinda Sierras, MD  montelukast (SINGULAIR) 10 MG tablet Take 1 tablet (10 mg total) by mouth every evening. 12/12/18  Yes Angiulli, Lavon Paganini, PA-C  PRESCRIPTION MEDICATION Inhale into the lungs at bedtime. CPAP   Yes [provider]  QUEtiapine (SEROQUEL) 25 MG tablet Take 1 tablet (25 mg total) by mouth at bedtime. 01/02/19  Yes Burchette, Alinda Sierras, MD     No Known Allergies  ROS:  Out of a complete 14 system review of  symptoms, the patient complains only of the following symptoms, and all other reviewed systems are negative.  Walking difficulty  Leg swelling, leg pain Slight memory problem  Blood pressure 112/64, pulse 63, temperature (!) 97.3 F (36.3 C), temperature source Temporal, height 5' 11" (1.803 m), weight 213 lb 5 oz (96.8 kg), SpO2 96 %.  Physical Exam  General: The patient is alert and cooperative at the time of the examination.  The patient is moderately obese.  Eyes: Pupils are equal, round, and reactive to light. Discs are flat bilaterally.  Neck: The neck is supple, no carotid bruits are noted.  Respiratory: The respiratory examination is clear.  Cardiovascular: The cardiovascular examination reveals a regular rate and rhythm, no obvious murmurs or rubs are noted.  Skin: Extremities are with 1-2+ edema below the knees bilaterally.  Neurologic Exam  Mental status: The patient is alert and oriented x 3 at the time of the examination. The Mini-Mental status examination done today shows a total score of 28/30.  Cranial nerves: Facial symmetry is present. There is good sensation of the face to pinprick and soft touch bilaterally. The strength of the facial muscles and the muscles to head turning and shoulder shrug are normal bilaterally. Speech is well enunciated, no aphasia or dysarthria is noted. Extraocular movements are full. Visual fields are full. The tongue is midline, and the patient has symmetric elevation of the soft palate. No obvious hearing deficits are noted.  Motor: The motor testing reveals 5 over 5 strength of all 4 extremities. Good symmetric motor tone is noted throughout.  Sensory: Sensory testing is intact to pinprick, soft touch, vibration sensation, and position sense on all 4 extremities. No evidence of extinction is noted.  Coordination: Cerebellar testing reveals good finger-nose-finger and heel-to-shin bilaterally.  Gait and station: Gait is slightly  wide-based, the patient is able to walk independently, normally uses a cane.  Tandem gait is unsteady.  Romberg is negative. No drift is seen.  Reflexes: Deep tendon reflexes are symmetric and normal bilaterally. Toes are downgoing bilaterally.   Assessment/Plan:  1.  Recent right frontal intracranial hemorrhage on anticoagulant therapy  2.  Seizure at onset of intracranial hemorrhage  3.  Recent pulmonary embolism, anticoagulation therapy restarted  4.  Hypoalbuminemia, peripheral edema  The patient currently is on Depakote for treatment of the seizures.  He seems to tolerate this fairly well, his cognitive status has improved.  We will follow the memory issues over time, it is possible that the patient may be able to come off of anticonvulsant therapy after 1 year if he has done well.  I am concerned about the gradual drop in his albumin level, the patient and his wife indicate that he is eating well, there is no evidence of malnutrition.  If this is related to dysfunction of the liver, this could have an impact on his risk for bleeding complications on Eliquis.  I would recommend a liver evaluation if his albumin levels do not recover.  The patient will have blood work done today.  A prescription for Depakote was sent in.  He will follow-up in 4 months.  Jill Alexanders MD 01/13/2019 9:36 AM  [facility] [address] [place], [place] [address]  Phone [phone] Fax [phone]

## 2019-01-14 ENCOUNTER — Other Ambulatory Visit: Payer: Self-pay | Admitting: Family Medicine

## 2019-01-14 ENCOUNTER — Ambulatory Visit (INDEPENDENT_AMBULATORY_CARE_PROVIDER_SITE_OTHER): Payer: Medicare Other | Admitting: Cardiology

## 2019-01-14 ENCOUNTER — Encounter: Payer: Self-pay | Admitting: Cardiology

## 2019-01-14 ENCOUNTER — Other Ambulatory Visit: Payer: Self-pay

## 2019-01-14 VITALS — BP 102/61 | HR 68 | Ht 71.0 in | Wt 214.0 lb

## 2019-01-14 DIAGNOSIS — I2699 Other pulmonary embolism without acute cor pulmonale: Secondary | ICD-10-CM

## 2019-01-14 DIAGNOSIS — E785 Hyperlipidemia, unspecified: Secondary | ICD-10-CM

## 2019-01-14 DIAGNOSIS — I10 Essential (primary) hypertension: Secondary | ICD-10-CM | POA: Diagnosis not present

## 2019-01-14 DIAGNOSIS — R600 Localized edema: Secondary | ICD-10-CM

## 2019-01-14 DIAGNOSIS — I611 Nontraumatic intracerebral hemorrhage in hemisphere, cortical: Secondary | ICD-10-CM

## 2019-01-14 DIAGNOSIS — Z7901 Long term (current) use of anticoagulants: Secondary | ICD-10-CM

## 2019-01-14 DIAGNOSIS — F17210 Nicotine dependence, cigarettes, uncomplicated: Secondary | ICD-10-CM

## 2019-01-14 DIAGNOSIS — R5383 Other fatigue: Secondary | ICD-10-CM

## 2019-01-14 DIAGNOSIS — I5032 Chronic diastolic (congestive) heart failure: Secondary | ICD-10-CM

## 2019-01-14 DIAGNOSIS — I480 Paroxysmal atrial fibrillation: Secondary | ICD-10-CM | POA: Diagnosis not present

## 2019-01-14 DIAGNOSIS — I7121 Aneurysm of the ascending aorta, without rupture: Secondary | ICD-10-CM

## 2019-01-14 DIAGNOSIS — Z79899 Other long term (current) drug therapy: Secondary | ICD-10-CM

## 2019-01-14 DIAGNOSIS — I712 Thoracic aortic aneurysm, without rupture: Secondary | ICD-10-CM

## 2019-01-14 LAB — COMPREHENSIVE METABOLIC PANEL
ALT: 64 IU/L — ABNORMAL HIGH (ref 0–44)
AST: 41 IU/L — ABNORMAL HIGH (ref 0–40)
Albumin/Globulin Ratio: 1.6 (ref 1.2–2.2)
Albumin: 3.6 g/dL — ABNORMAL LOW (ref 3.7–4.7)
Alkaline Phosphatase: 164 IU/L — ABNORMAL HIGH (ref 39–117)
BUN/Creatinine Ratio: 13 (ref 10–24)
BUN: 21 mg/dL (ref 8–27)
Bilirubin Total: 0.3 mg/dL (ref 0.0–1.2)
CO2: 24 mmol/L (ref 20–29)
Calcium: 9 mg/dL (ref 8.6–10.2)
Chloride: 95 mmol/L — ABNORMAL LOW (ref 96–106)
Creatinine, Ser: 1.6 mg/dL — ABNORMAL HIGH (ref 0.76–1.27)
GFR calc Af Amer: 49 mL/min/{1.73_m2} — ABNORMAL LOW (ref >59)
GFR calc non Af Amer: 42 mL/min/{1.73_m2} — ABNORMAL LOW (ref >59)
Globulin, Total: 2.3 g/dL (ref 1.5–4.5)
Glucose: 151 mg/dL — ABNORMAL HIGH (ref 65–99)
Potassium: 3.9 mmol/L (ref 3.5–5.2)
Sodium: 137 mmol/L (ref 134–144)
Total Protein: 5.9 g/dL — ABNORMAL LOW (ref 6.0–8.5)

## 2019-01-14 LAB — AMMONIA: Ammonia: 49 ug/dL (ref 31–169)

## 2019-01-14 LAB — VALPROIC ACID LEVEL: Valproic Acid Lvl: 62 ug/mL (ref 50–100)

## 2019-01-14 MED ORDER — CARVEDILOL 6.25 MG PO TABS: 6.2500 mg | ORAL_TABLET | Freq: Two times a day (BID) | ORAL | 3 refills | Status: DC

## 2019-01-14 MED ORDER — AMIODARONE HCL 200 MG PO TABS: 100.0000 mg | ORAL_TABLET | Freq: Every day | ORAL | 2 refills | Status: DC

## 2019-01-14 NOTE — Patient Instructions (Addendum)
Medication Instructions:  --DO NOT RESTART LOSARTAN   -- DECREASE CARVEDILOL (COREG)  6.25 MG ONE TABLET TWICE  A DAY  OR ( YOU CAN TAKE 1/2 TABLET OF 12.5 MG TABLET TWICE A DAY )   -- DECREASE  AMIODARONE  100 MG ( 1/2 TABLET OF 200 MG ) DAILY DO NOT TAKE TOMORROW- START ON Friday 0CT. 9,2020.   -- IF BLOOD PRESSURE IS ABOVE 150 /? SYSTOLIC - YOU MAY TAKE LOSARTAN 25 MG IF YOU HAVE SOME IF NOT TAKE 12.5 MG OF COREG  TWICE A DAY UNTIL BLOOD PRESSURE RETURN TO BELOW 150.    If you need a refill on your cardiac medications before your next appointment, please call your pharmacy.   Lab work: NOT NEEDED If you have labs (blood work) drawn today and your tests are completely normal, you will receive your results only by: Marland Kitchen MyChart Message (if you have MyChart) OR . A paper copy in the mail If you have any lab test that is abnormal or we need to change your treatment, we will call you to review the results.  Testing/Procedures: NOT NEEDED  Follow-Up: At [facility], you and your health needs are our priority.  As part of our continuing mission to provide you with exceptional heart care, we have created designated Provider Care Teams.  These Care Teams include your primary Cardiologist (physician) and Advanced Practice Providers (APPs -  Physician Assistants and Nurse Practitioners) who all work together to provide you with the care you need, when you need it. . You will need a follow up appointment in  3 to 4  Months- JAN/FEB 2021.  Please call our office 2 months in advance to schedule this appointment.  You may see Glenetta Hew, MD or one of the following Advanced Practice Providers on your designated Care Team:   . Rosaria Ferries, PA-C . Jory Sims, DNP, ANP  Any Other Special Instructions Will Be Listed Below (If Applicable).   You have been referred to [place]

## 2019-01-14 NOTE — Telephone Encounter (Signed)
ok 

## 2019-01-14 NOTE — Progress Notes (Signed)
PCP: [name], MD  Clinic Note: Chief Complaint  Patient presents with  . Hospitalization Follow-up    Multiple admissions-extremely hemorrhage, altered mental status/encephalopathy, PE  . Atrial Fibrillation    PFT results  . Fatigue    HPI: Ronald [name] is a 73 y.o. male with a PMH below who presents today for paroxysmal persistent atrial fibrillation recently complicated by subarachnoid hemorrhage and then pulmonary embolism that occurred after cessation of anticoagulation.  He presents here now for post hospital follow-up but also discuss results of his PFTs.  Ronald [name] was last seen via telehealth conferencing October 06, 2018--at that time he noticed that he just was out of shape.  COVID-19.  He would almost quit smoking but relapsed with fillet 19 started.  His weight has been down but he gained it back to over 19.  Recent Hospitalizations:  --> Throughout his 3 hospitalizations, he remained in sinus rhythm on amiodarone.   7/14-20/2020: Initially felt sick and then had generalized tonic-clonic seizure lasting about a minute.--Code stroke activated by EMS and EDP.  He had not been feeling well all day.  Roughly 3 PM was very confused unable to speak. -->  Initial CT suggested small subarachnoid bleed (both subcortical and cortical ICH), also noted to have hypertensive urgency.  Eliquis discontinued and started on aspirin; loaded with Keppra for seizures  Treated with Cleviprex in the ICU for hypotension.  Normalize pressures prior to discharge  Discharge to palpation rehab for PT OT.  8/6-10/2018: ER visit for altered mental status.  No evidence of new stroke.  Suggested to be dehydrated. -->  Went to see PCP.  Noted some fatigue malaise and intermittent confusion with slow cognitive recovery.  11/21/18/2020 admission: Again altered mental status-continued confusion and unsteady gait with possible seizure.  Thought to potentially  reaction to Keppra -> met  metabolic encephalopathy versus medication reaction.  Also potential UTI.Marland Kitchen  Converted to Depakote.  Stable from intracerebral hemorrhage standpoint. ->  This time he was discharged to inpatient rehab, discharged on September 5.  9/26-10/05/2018 admission: Presented with lower leg pain and confusion has been doing well walking at home, but unable to walk from 01/01/2019 due to bilateral lower leg pain worse on the left.  Noted bilateral leg edema despite increasing Lasix.  Went to ER when he became confused on 01/03/2019.  -->  Hypoxic on EMS evaluation with O2 sats in the 80s.  CTA chest revealed acute PE in the right lower lobe and evidence of possible right heart strain.  (Not confirmed with echo) -> discussion with neurology, recommended trial run of heparin for 72 hours prior to restarting oral anticoagulation. ->  With no evidence of rebleed, was started back on Eliquis.  Leg pain thought to be related to potential gout flare no DVT noted.  Started on colchicine.  Neurology follow-up: Albumin level noted to be quite low on September 30.  Continued having edema.  Slight gait imbalance, using cane.  No headache or residual numbness/weakness.  Studies Personally Reviewed - (if available, images/films reviewed: From Epic Chart or Care Everywhere)  PFTs --> worsening DLCO, need to discuss with EP potential stopping amiodarone.  DLCO 13.3 (down from 15.8).  There is airway obstruction and diffusion defect suggesting emphysema there is no evidence of overinflation.  Suggest evaluation with exercise to evaluate presence of hypoxemia.  Reduced diffusing capacity indicates moderate to severe loss of functional alveolar service, however not corrected for hemoglobin..  Echo 01/04/2019: EF 60  to 65%.  Mild LVH.  GRII DD.  Normal RV size and function.  Mild LA dilation.  Normal RA size.  Unable to assess PA pressures.  No significant aortic sclerosis.  Echo July 2020: EF> 65%.  Moderate concentric LVH.-GR 1 DD  mild RA dilation.  Interval History: Ronald [name] is here today for follow-up from these multiple hospitalizations.  I actually saw him when he was being discharged after the subarachnoid hemorrhage.  I was not aware of the additional admissions until this visit.  He is a little bit frustrated because initially after the stroke, he was getting back into his exercise and doing well with rehab, but since the recent hospitalization he is just been getting worse.  His gait has been unsteady he has been dizzy ever since his PE.  This was worse than after the stroke.  He just also feels generalized fatigue unsteady gait, difficulty with balance.  Comes and goes and is usually worse at the end of the day.  Not really affected by furosemide which she takes twice daily.  He has not had any chest pain or pressure with rest or exertion, and when he walks he does not really notice that much in the way of any exertional dyspnea. He also has not had any signs and symptoms of being in A. fib.  He has not had any irregular heartbeats.  Just a few skipped beats here and there.  Besides having some intermittent confusion and unsteady gait, is not having any new symptoms of TIA or amaurosis fugax.  No syncope or near syncope. Happy to be back on Eliquis with no bleeding issues, melena, hematochezia, hematuria or epistaxis. Not really walking a lot but does not notice claudication.  ROS: A comprehensive was performed. Review of Systems  Constitutional: Positive for malaise/fatigue (Pretty much has lack of drive, lack of get up and go.  Wakes up takes a shower and goes back to the couch where he rests most of the day.). Negative for weight loss.  HENT: Negative for congestion and nosebleeds.   Eyes: Positive for blurred vision. Negative for double vision, photophobia and discharge.  Respiratory: Positive for cough (Chronic intermittent cough but no fevers or chills.). Negative for shortness of breath (Not really unless he tries  to exert himself, but is quite deconditioned.) and wheezing.   Cardiovascular: Positive for leg swelling (Present throughout the day but better at the end of the day.).  Gastrointestinal: Positive for heartburn (Depending upon what he eats) and nausea (Not really since his last hospitalization.). Negative for vomiting.  Genitourinary: Negative for dysuria (He did have some burning when he urinated but it was probably related to Foley catheter.  Index hospitalization.  No UTI symptoms now.).  Musculoskeletal: Negative for back pain, falls, joint pain and myalgias.  Neurological: Positive for dizziness (, Unsteady gait.  Almost ataxia) and weakness (Generalized.  But has been in the hospital quite a bit over the last 2 and half months.). Negative for speech change (Not really change in speech, just slowed speech) and focal weakness.  Psychiatric/Behavioral: Positive for memory loss. Negative for depression (Not necessarily depression, but he is certainly demoralized at this most recent bout of hospitalizations.). The patient is nervous/anxious. The patient does not have insomnia.   All other systems reviewed and are negative.  /The patient does not have symptoms concerning for COVID-19 infection (fever, chills, cough, or new shortness of breath).  The patient is practicing social distancing.   COVID-19 Education: The signs  and symptoms of COVID-19 were discussed with the patient and how to seek care for testing (follow up with PCP or arrange E-visit).   The importance of social distancing was discussed today.  I have reviewed and (if needed) personally updated the patient's problem list, medications, allergies, past medical and surgical history, social and family history.   Past Medical History:  Diagnosis Date  . Allergic rhinitis   . Anticoagulant long-term use    eliquis  . Atrial fibrillation ([place])   . Cardiomyopathy due to systemic disease [facility])    followed by dr harding  . COPD with  emphysema [facility])    pulmologist-  dr Halford Chessman  . Dyspnea    occasional per pt  . Heart disease   . History of colon polyps    tubular adenoma 2013  . History of gout    09-05-2017 last flare-up  05/ 2019 3 wks ago, feet  . History of sepsis 02/18/2017   per d/c note probable uti, acute chf, acute renal failure, hypoxia  . Hyperlipidemia   . Hyperplasia of prostate with lower urinary tract symptoms (LUTS)   . Hypertension   . OSA on CPAP    per study 08-03-2004  Severe OSA  . Persistent atrial fibrillation [facility])    cardiologist --  dr Dorris Carnes--  post cardioversion 05-07-2014  . Prostate cancer [facility]) urologsit-  dr Alyson Ingles--- as of 05-21-2017 per pt last PSA 11 approx.   Dx  2014--  stage T1c, Gleason 3+3=6, PSA 6.67--  Active surveillance/  04/ 2019  Stage T1b, Gleason 3+4, PSA 12.8- plan external radiation therapy    . 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 ([place])    pulmologist-  dr Halford Chessman  . Seasonal allergies   . Sigmoid diverticulosis   . Systolic and diastolic CHF, chronic [facility])    cardiologist-  [name]  . Tinea versicolor   . Type 2 diabetes mellitus ([place])   . Wears hearing aid in both ears     Past Surgical History:  Procedure Laterality Date  . CARDIOVERSION N/A 05/07/2014   Procedure: CARDIOVERSION;  Surgeon: [name], MD;  Location: [facility] ENDOSCOPY;  Service: Cardiovascular;  Laterality: N/A;  . CARDIOVERSION N/A 09/09/2014   Procedure: CARDIOVERSION;  Surgeon: Lelon Perla, MD;  Location: [facility] ENDOSCOPY;  Service: Cardiovascular;  Laterality: N/A;  successfully  . CATARACT EXTRACTION W/ INTRAOCULAR LENS  IMPLANT, BILATERAL  08 and 09/  2018  . COLONOSCOPY  last one 06-07-2011  . GOLD SEED IMPLANT N/A 09/09/2017   Procedure: GOLD SEED IMPLANT;  Surgeon: [name], MD;  Location: [facility];  Service: Urology;  Laterality: N/A;  . HYDROCELE EXCISION Bilateral 09/26/2015   Procedure: HYDROCELECTOMY ADULT;  Surgeon:  [name], MD;  Location: [facility];  Service: Urology;  Laterality: Bilateral;  . LAMINECTOMY AND MICRODISCECTOMY LUMBAR SPINE  12-23-2003   Left L5 -- S1  . LAPAROSCOPIC BILATERAL INGUINAL HERNIA REPAIR/  UMBILICAL HERNIA REPAIR WITH MESH/  ASPIRATION LEFT HYDROCELE  07-11-2012  . NM MYOVIEW LTD  05/18/2014   Low risk study. Normal perfusion: No ischemia or infarction. Mild LV dysfunction - 46% (does not correlate with echocardiographic EF of 50-55%)  . PROSTATE BIOPSY  05/15/12   Clinically both Lobes  . SPACE OAR INSTILLATION N/A 09/09/2017   Procedure: SPACE OAR INSTILLATION;  Surgeon: [name], MD;  Location: [facility];  Service: Urology;  Laterality: N/A;  . TEE WITHOUT CARDIOVERSION  N/A 05/07/2014   Procedure: TRANSESOPHAGEAL ECHOCARDIOGRAM (TEE);  Surgeon: [name], MD;  Location: [facility] ENDOSCOPY;  Service: Cardiovascular;  Laterality: N/A;   mild atherosclerosis plaque of aorta,  mild AR, MR, and TR,  no cardiac source of emboli  . TONSILLECTOMY  as child  . TRANSTHORACIC ECHOCARDIOGRAM  11/'18; 1/'19   a) In setting of sepsis: EF of 40-45%.  Diffuse hypokinesis.  No RWMA.  Biatrial enlargement.;; b) ** f/u Jan 2019: Normal LVF 55-60%** , mildly dilated aortic root(38 mm) and ascending aorta (44 mm). Compared to prior echo, LVEF has improved.  . TRANSTHORACIC ECHOCARDIOGRAM  7/'20; 9/'20   a) Hemorrhagic CVA/SAH - EF> 65%.  Moderate concentric LVH.-GR 1 DD mild RA dilation.; b) EF 60 to 65%.  Mild LVH.  GRII DD.  Normal RV size and function.  Mild LA dilation.  Normal RA size.  Unable to assess PA pressures.  No significant aortic sclerosis.  . TRANSURETHRAL RESECTION OF PROSTATE N/A 05/30/2017   Procedure: TRANSURETHRAL RESECTION OF THE PROSTATE (TURP);  Surgeon: [name], MD;  Location: [facility];  Service: Urology;  Laterality: N/A;    Current Meds  Medication Sig  . albuterol (VENTOLIN HFA) 108 (90 Base) MCG/ACT inhaler  Inhale 2 puffs into the lungs every 6 (six) hours as needed for wheezing or shortness of breath.  Marland Kitchen amiodarone (PACERONE) 200 MG tablet Take 0.5 tablets (100 mg total) by mouth daily.  Marland Kitchen apixaban (ELIQUIS) 5 MG TABS tablet Take 1 tablet (5 mg total) by mouth 2 (two) times daily.  Marland Kitchen atorvastatin (LIPITOR) 10 MG tablet TAKE 1 TABLET BY MOUTH DAILY EACH EVENING (Patient taking differently: Take 10 mg by mouth every evening. TAKE 1 TABLET BY MOUTH DAILY EACH EVENING)  . azelastine (ASTELIN) 0.1 % nasal spray Place 1 spray into both nostrils 2 (two) times daily as needed for rhinitis or allergies.  . Blood Glucose Monitoring Suppl (ACCU-CHEK AVIVA PLUS) w/Device KIT Use blood glucose machine as instructed on your strips  . budesonide-formoterol (SYMBICORT) 160-4.5 MCG/ACT inhaler Inhale 2 puffs into the lungs 2 (two) times daily.  . colchicine 0.6 MG tablet Take 1 tablet (0.6 mg total) by mouth 2 (two) times daily as needed (gout flare).  . diclofenac sodium (VOLTAREN) 1 % GEL Apply 4 g topically 4 (four) times daily. (Patient taking differently: Apply 4 g topically 4 (four) times daily as needed (foot pain). )  . divalproex (DEPAKOTE) 500 MG DR tablet Take 1 tablet (500 mg total) by mouth every 12 (twelve) hours.  . furosemide (LASIX) 40 MG tablet Take 40 mg by mouth 2 (two) times daily with breakfast and lunch.   . glimepiride (AMARYL) 2 MG tablet Take 1 tablet (2 mg total) by mouth daily with breakfast.  . glucose blood (ACCU-CHEK AVIVA PLUS) test strip Test 2 times daily dx e11.9  . HYDROcodone-acetaminophen (NORCO/VICODIN) 5-325 MG tablet Take 1 tablet by mouth every 6 (six) hours as needed for moderate pain.  . Lancets (ACCU-CHEK SOFT TOUCH) lancets Check blood sugars once per day. DX: E11.9  . metFORMIN (GLUCOPHAGE) 500 MG tablet TAKE 2 TABLETS (1,000 MG TOTAL) BY MOUTH 2 (TWO) TIMES DAILY WITH A MEAL.  . montelukast (SINGULAIR) 10 MG tablet Take 1 tablet (10 mg total) by mouth every evening.  Marland Kitchen  PRESCRIPTION MEDICATION Inhale into the lungs at bedtime. CPAP  . QUEtiapine (SEROQUEL) 25 MG tablet Take 1 tablet (25 mg total) by mouth at bedtime.  . [DISCONTINUED] amiodarone (PACERONE) 200 MG  tablet Take 1 tablet (200 mg total) by mouth daily.  . [DISCONTINUED] carvedilol (COREG) 12.5 MG tablet Take 1 tablet (12.5 mg total) by mouth 2 (two) times daily.  . [DISCONTINUED] losartan (COZAAR) 25 MG tablet Take 1 tablet (25 mg total) by mouth daily.    No Known Allergies  Social History   Tobacco Use  . Smoking status: Current Every Day Smoker    Packs/day: 0.25    Years: 60.00    Pack years: 15.00    Types: Cigarettes    Last attempt to quit: 04/11/2018    Years since quitting: 0.7  . Smokeless tobacco: Never Used  . Tobacco comment: 09-05-2017 tried quitting smoking 11/ 2018 but started back at 0.75ppd from 1ppd  Substance Use Topics  . Alcohol use: Yes    Alcohol/week: 14.0 standard drinks    Types: 14 Standard drinks or equivalent per week    Comment: 2 drinks daily  . Drug use: No   Social History   Social History Narrative   Married 25 years   2 children but not with this wife   Left handed     family history includes Breast cancer in his mother; Ovarian cancer in his sister; Stroke in an other family member.  Wt Readings from Last 3 Encounters:  01/14/19 214 lb (97.1 kg)  01/13/19 213 lb 5 oz (96.8 kg)  01/09/19 215 lb 8 oz (97.8 kg)    PHYSICAL EXAM BP 102/61   Pulse 68   Ht '5\' 11"'  (1.803 m)   Wt 214 lb (97.1 kg)   SpO2 92%   BMI 29.85 kg/m  Physical Exam  Constitutional: He is oriented to person, place, and time. He appears well-developed and well-nourished. No distress.  Surprisingly healthy-appearing.  Well-groomed.  HENT:  Head: Normocephalic and atraumatic.  Neck: Normal range of motion. Neck supple. No hepatojugular reflux and no JVD present. Carotid bruit is not present.  Cardiovascular: Normal rate, regular rhythm, normal heart sounds and intact  distal pulses. Exam reveals no gallop and no friction rub.  No murmur heard. Pulmonary/Chest: Effort normal. No respiratory distress. He has no wheezes. He has no rales. He exhibits no tenderness.  Mildly diminished breath sounds in the bases, but otherwise normal.  Abdominal: Soft. He exhibits no distension. There is no abdominal tenderness. There is no rebound.  Somewhat hyperactive bowel sounds.  No obvious HSM.  Musculoskeletal: Normal range of motion.        General: Edema (Bilateral LE edema about 1-2+.) present.  Neurological: He is alert and oriented to person, place, and time.  Psychiatric: His behavior is normal. Thought content normal.  Mood and affect for the most part, but does seem a little bit down about all these hospital stays etc.  A little bit frustrated.  Vitals reviewed.    Adult ECG Report  Rate: 68 ;  Rhythm: normal sinus rhythm and Nonspecific ST and T wave changes.;  Subtle mild biphasic T waves/T wave inversions in anterior leads.  Narrative Interpretation: Relatively stable EKG  Other studies Reviewed: Additional studies/ records that were reviewed today include:  Recent Labs:   Lab Results  Component Value Date   CHOL 173 10/21/2018   HDL 35 (L) 10/21/2018   LDLCALC 77 10/21/2018   LDLDIRECT 69.0 04/22/2018   TRIG 306 (H) 10/21/2018   CHOLHDL 4.9 10/21/2018   Lab Results  Component Value Date   CREATININE 1.60 (H) 01/13/2019   BUN 21 01/13/2019   NA 137 01/13/2019  K 3.9 01/13/2019   CL 95 (L) 01/13/2019   CO2 24 01/13/2019   Lab Results  Component Value Date   CREATININE 1.60 (H) 01/13/2019   Lab Results  Component Value Date   TSH 3.92 04/22/2018   Lab Results  Component Value Date   WBC 5.6 01/09/2019   HGB 10.1 (L) 01/09/2019   HCT 30.2 (L) 01/09/2019   MCV 98.1 01/09/2019   PLT 338 01/09/2019    Lab Results  Component Value Date   HGBA1C 6.9 (H) 10/21/2018    CRP 1.4, ESR 16 (mildly elevated and upper limit of normal)    ASSESSMENT / PLAN: Problem List Items Addressed This Visit    Paroxysmal atrial fibrillation ([place]) - Primary (Chronic)    So far he has maintained sinus rhythm on amiodarone.  My concern is that with the PFTs there has been a drop in DLCO.  He does have underlying lung disease and that could be the cause. Excellent recommendation for now is to reduce amiodarone dose to 100 mg daily, reduce beta-blocker dose to allow for more heart rate responsiveness and blood pressure. Refer to electrophysiology to discuss other options besides amiodarone in light of PFT results, CRP and ESR.  --Consider potential of weaning off amiodarone and then potential ablation if there is recurrence versus Tikosyn/sotalol options.      Relevant Medications   carvedilol (COREG) 6.25 MG tablet   amiodarone (PACERONE) 200 MG tablet   Other Relevant Orders   EKG 12-Lead (Completed)   Ambulatory referral to Cardiac Electrophysiology   Hyperlipidemia with target LDL less than 70 (Chronic)    LDL somewhere between 70-77 on most recent check.  He is on modest dose of atorvastatin.  Would like to see the LDL less than 70.  There is been a suggestion of SATURN Study.  I think he may be okay if we were to potentially slightly increase atorvastatin or add Zetia.  But for now I do not make any changes that could make his neurologic status worsen      Relevant Medications   carvedilol (COREG) 6.25 MG tablet   amiodarone (PACERONE) 200 MG tablet   Essential hypertension (Chronic)   Relevant Medications   carvedilol (COREG) 6.25 MG tablet   amiodarone (PACERONE) 200 MG tablet   On amiodarone therapy (Chronic)    We started getting double low-grade zone here with amiodarone.  His PFTs show slight worsening DLCO, borderline sed rate and CRP levels.  My suspicion is that were probably okay maintaining amiodarone but I would reduce to 100 mg daily. I will have her referred back to electrophysiology for their opinion as to whether  or not we should maintain amiodarone or potentially do a trial off of amiodarone plus/minus consider switching to either sotalol or Tikosyn.  Other consideration would be ablation if there is recurrence.      Relevant Orders   Ambulatory referral to Cardiac Electrophysiology   Cigarette smoker two packs a day or less (Chronic)    He was doing a really good job of quitting activity happened.  I did again stressed to him importance of still smoking cessation.  Continue to discuss smoking cessation, but he is not ready at this point.      Chronic anticoagulation (Chronic)    Eliquis is likely the safest oral anticoagulant known, and he still has intracerebral hemorrhage.  Unfortunate this led to seizure disorder now and altered mental status.  In addition to A. fib, now he has  recently had a pulmonary embolus for which is the second reason to put him on Eliquis.  Follow closely for.  Try to avoid hypertension with as needed medicines.      ICH (intracerebral hemorrhage) (HCC) w/ SAH while on Eliquis (Chronic)    He had a subarachnoid hemorrhage in the setting of being on Eliquis which is a very low likelihood event.  He was found to be hypertensive, but I suspect that is probably neurologic in nature and not the cause.  I do need to be judicious with his blood pressure control but do think we need to have his blood pressure be up a little bit from where it is currently.  I suspect this could be contributing to his confusion and unsteady gait.  He is back on Eliquis now and I would have reduce his carvedilol dose while having him not restart his losartan.  We need to follow his blood pressures and start treating again if his systolic pressures go above 150.  I have given him instructions on using losartan or Bystolic and additional dose as needed SBP greater than 150.      Chronic diastolic CHF (congestive heart failure) (HCC)    EF does look to be preserved.  He does have some evidence of some  diastolic dysfunction but not severe. He has been on Lasix daily but now since the hospital he taken twice daily Lasix.  I suspect some of the swelling is not truly cardiac. For now would recommend continue current medic Acacian dose but also recommend support stockings and foot elevation.      Relevant Medications   carvedilol (COREG) 6.25 MG tablet   amiodarone (PACERONE) 200 MG tablet   Thoracic ascending aortic aneurysm (HCC)    Did not seem to be that dilated, begin probably wait another year or so to recheck.      Relevant Medications   carvedilol (COREG) 6.25 MG tablet   amiodarone (PACERONE) 200 MG tablet   Acute pulmonary embolism (HCC)    Recent PE.  Now tapered down to maintenance dose of Eliquis.  I suspect that this had something to do with his immobilization following the stroke making it difficult decision on thoughts of long-term anticoagulation.  However with both recent PE and A. fib, we will continue Eliquis.      Relevant Medications   carvedilol (COREG) 6.25 MG tablet   amiodarone (PACERONE) 200 MG tablet   Other Relevant Orders   EKG 12-Lead (Completed)   Lower extremity edema    He is on his same dose of Lasix which does not really seem to be helping.  This would suggest that this probably not volume related.  His albumin is 2 which would suspect that this probably was due to protein malnutrition.  Discussed importance of increased protein intake. Recommend support stockings and foot elevation.  Also discussed foot/leg exercise to help with stimulating blood flow.      Fatigue due to treatment    I am not really sure what the true cause of his fatigue is.  What I do think we should probably do with his blood pressure be 102/61 and recent stroke is allowed to have maybe even some mild permissive hypertension.    Plan:   Reduce his carvedilol dose to 6.25 twice daily-hold for increased rate responsiveness and increased blood pressure.  Reduce amiodarone to  100 mg daily (and consider potentially discontinuing)  Continue rehab  He is mildly anemic -> increase protein intake,  may need iron supplementation.  Do not start losartan         I spent a total of 28-30 minutes with the patient and chart review. >  50% of the time was spent in direct patient consultation.  I also spent an additional 20+ minutes on chart review, reviewing hospitalizations and recent studies.  Current medicines are reviewed at length with the patient today.  (+/- concerns) he is now quite worried about anticoagulation, fatigue etc. The following changes have been made:    Patient Instructions  Medication Instructions:  --DO NOT RESTART LOSARTAN   -- DECREASE CARVEDILOL (COREG)  6.25 MG ONE TABLET TWICE  A DAY  OR ( YOU CAN TAKE 1/2 TABLET OF 12.5 MG TABLET TWICE A DAY )   -- DECREASE  AMIODARONE  100 MG ( 1/2 TABLET OF 200 MG ) DAILY DO NOT TAKE TOMORROW- START ON Friday 0CT. 9,2020.   -- IF BLOOD PRESSURE IS ABOVE 150 /? SYSTOLIC - YOU MAY TAKE LOSARTAN 25 MG IF YOU HAVE SOME IF NOT TAKE 12.5 MG OF COREG  TWICE A DAY UNTIL BLOOD PRESSURE RETURN TO BELOW 150.  If you need a refill on your cardiac medications before your next appointment, please call your pharmacy.   Lab work: NOT NEEDED If you have labs (blood work) drawn today and your tests are completely normal, you will receive your results only by: Marland Kitchen MyChart Message (if you have MyChart) OR . A paper copy in the mail If you have any lab test that is abnormal or we need to change your treatment, we will call you to review the results.  Testing/Procedures: NOT NEEDED   Follow-Up: At [facility], you and your health needs are our priority.  As part of our continuing mission to provide you with exceptional heart care, we have created designated Provider Care Teams.  These Care Teams include your primary Cardiologist (physician) and Advanced Practice Providers (APPs -  Physician Assistants and Nurse  Practitioners) who all work together to provide you with the care you need, when you need it. You will need a follow up appointment in 3 months.  Please call our office 2 months in advance to schedule this appointment.  You may see Glenetta Hew, MD or one of the following Advanced Practice Providers on your designated Care Team:   Rosaria Ferries, PA-C . Jory Sims, DNP, ANP   You have been referred to DR Vermilion 300- DISCUSS AMIODARONE VS ABALTION  Studies Ordered:   Orders Placed This Encounter  Procedures  . Ambulatory referral to Cardiac Electrophysiology  . EKG 12-Lead      Glenetta Hew, M.D., M.S. Interventional Cardiologist   Pager # [phone] Phone # [phone] [address] [place], [place] [address]   Thank you for choosing Heartcare at [facility]!!

## 2019-01-15 ENCOUNTER — Encounter: Payer: Self-pay | Admitting: Cardiology

## 2019-01-15 DIAGNOSIS — N183 Chronic kidney disease, stage 3 unspecified: Secondary | ICD-10-CM | POA: Diagnosis not present

## 2019-01-15 DIAGNOSIS — G40909 Epilepsy, unspecified, not intractable, without status epilepticus: Secondary | ICD-10-CM | POA: Diagnosis not present

## 2019-01-15 DIAGNOSIS — I69198 Other sequelae of nontraumatic intracerebral hemorrhage: Secondary | ICD-10-CM | POA: Diagnosis not present

## 2019-01-15 DIAGNOSIS — I130 Hypertensive heart and chronic kidney disease with heart failure and stage 1 through stage 4 chronic kidney disease, or unspecified chronic kidney disease: Secondary | ICD-10-CM | POA: Diagnosis not present

## 2019-01-15 DIAGNOSIS — E1122 Type 2 diabetes mellitus with diabetic chronic kidney disease: Secondary | ICD-10-CM | POA: Diagnosis not present

## 2019-01-15 DIAGNOSIS — R5383 Other fatigue: Secondary | ICD-10-CM | POA: Insufficient documentation

## 2019-01-15 DIAGNOSIS — R600 Localized edema: Secondary | ICD-10-CM | POA: Insufficient documentation

## 2019-01-15 DIAGNOSIS — I5032 Chronic diastolic (congestive) heart failure: Secondary | ICD-10-CM | POA: Diagnosis not present

## 2019-01-15 NOTE — Assessment & Plan Note (Signed)
He was doing a really good job of quitting activity happened.  I did again stressed to him importance of still smoking cessation.  Continue to discuss smoking cessation, but he is not ready at this point.

## 2019-01-15 NOTE — Assessment & Plan Note (Signed)
Eliquis is likely the safest oral anticoagulant known, and he still has intracerebral hemorrhage.  Unfortunate this led to seizure disorder now and altered mental status.  In addition to A. fib, now he has recently had a pulmonary embolus for which is the second reason to put him on Eliquis.  Follow closely for.  Try to avoid hypertension with as needed medicines.

## 2019-01-15 NOTE — Assessment & Plan Note (Signed)
EF does look to be preserved.  He does have some evidence of some diastolic dysfunction but not severe. He has been on Lasix daily but now since the hospital he taken twice daily Lasix.  I suspect some of the swelling is not truly cardiac. For now would recommend continue current medic Acacian dose but also recommend support stockings and foot elevation.

## 2019-01-15 NOTE — Assessment & Plan Note (Signed)
He had a subarachnoid hemorrhage in the setting of being on Eliquis which is a very low likelihood event.  He was found to be hypertensive, but I suspect that is probably neurologic in nature and not the cause.  I do need to be judicious with his blood pressure control but do think we need to have his blood pressure be up a little bit from where it is currently.  I suspect this could be contributing to his confusion and unsteady gait.  He is back on Eliquis now and I would have reduce his carvedilol dose while having him not restart his losartan.  We need to follow his blood pressures and start treating again if his systolic pressures go above 150.  I have given him instructions on using losartan or Bystolic and additional dose as needed SBP greater than 150.

## 2019-01-15 NOTE — Assessment & Plan Note (Signed)
Recent PE.  Now tapered down to maintenance dose of Eliquis.  I suspect that this had something to do with his immobilization following the stroke making it difficult decision on thoughts of long-term anticoagulation.  However with both recent PE and A. fib, we will continue Eliquis.

## 2019-01-15 NOTE — Assessment & Plan Note (Signed)
We started getting double low-grade zone here with amiodarone.  His PFTs show slight worsening DLCO, borderline sed rate and CRP levels.  My suspicion is that were probably okay maintaining amiodarone but I would reduce to 100 mg daily. I will have her referred back to electrophysiology for their opinion as to whether or not we should maintain amiodarone or potentially do a trial off of amiodarone plus/minus consider switching to either sotalol or Tikosyn.  Other consideration would be ablation if there is recurrence.

## 2019-01-15 NOTE — Assessment & Plan Note (Signed)
Did not seem to be that dilated, begin probably wait another year or so to recheck.

## 2019-01-15 NOTE — Assessment & Plan Note (Signed)
I am not really sure what the true cause of his fatigue is.  What I do think we should probably do with his blood pressure be 102/61 and recent stroke is allowed to have maybe even some mild permissive hypertension.    Plan:   Reduce his carvedilol dose to 6.25 twice daily-hold for increased rate responsiveness and increased blood pressure.  Reduce amiodarone to 100 mg daily (and consider potentially discontinuing)  Continue rehab  He is mildly anemic -> increase protein intake, may need iron supplementation.  Do not start losartan

## 2019-01-15 NOTE — Assessment & Plan Note (Signed)
So far he has maintained sinus rhythm on amiodarone.  My concern is that with the PFTs there has been a drop in DLCO.  He does have underlying lung disease and that could be the cause. Excellent recommendation for now is to reduce amiodarone dose to 100 mg daily, reduce beta-blocker dose to allow for more heart rate responsiveness and blood pressure. Refer to electrophysiology to discuss other options besides amiodarone in light of PFT results, CRP and ESR.  --Consider potential of weaning off amiodarone and then potential ablation if there is recurrence versus Tikosyn/sotalol options.

## 2019-01-15 NOTE — Assessment & Plan Note (Signed)
He is on his same dose of Lasix which does not really seem to be helping.  This would suggest that this probably not volume related.  His albumin is 2 which would suspect that this probably was due to protein malnutrition.  Discussed importance of increased protein intake. Recommend support stockings and foot elevation.  Also discussed foot/leg exercise to help with stimulating blood flow.

## 2019-01-15 NOTE — Assessment & Plan Note (Signed)
LDL somewhere between 70-77 on most recent check.  He is on modest dose of atorvastatin.  Would like to see the LDL less than 70.  There is been a suggestion of SATURN Study.  I think he may be okay if we were to potentially slightly increase atorvastatin or add Zetia.  But for now I do not make any changes that could make his neurologic status worsen

## 2019-01-16 ENCOUNTER — Encounter: Payer: Self-pay | Admitting: Family Medicine

## 2019-01-16 ENCOUNTER — Other Ambulatory Visit: Payer: Self-pay

## 2019-01-16 ENCOUNTER — Ambulatory Visit (INDEPENDENT_AMBULATORY_CARE_PROVIDER_SITE_OTHER): Payer: Medicare Other | Admitting: Family Medicine

## 2019-01-16 VITALS — BP 90/58 | HR 68 | Temp 98.7°F | Resp 18 | Ht 71.0 in | Wt 198.0 lb

## 2019-01-16 DIAGNOSIS — I10 Essential (primary) hypertension: Secondary | ICD-10-CM

## 2019-01-16 DIAGNOSIS — Z720 Tobacco use: Secondary | ICD-10-CM

## 2019-01-16 DIAGNOSIS — Z23 Encounter for immunization: Secondary | ICD-10-CM

## 2019-01-16 DIAGNOSIS — I480 Paroxysmal atrial fibrillation: Secondary | ICD-10-CM

## 2019-01-16 DIAGNOSIS — I611 Nontraumatic intracerebral hemorrhage in hemisphere, cortical: Secondary | ICD-10-CM | POA: Diagnosis not present

## 2019-01-16 DIAGNOSIS — Z7901 Long term (current) use of anticoagulants: Secondary | ICD-10-CM

## 2019-01-16 DIAGNOSIS — N183 Chronic kidney disease, stage 3 unspecified: Secondary | ICD-10-CM

## 2019-01-16 DIAGNOSIS — I2699 Other pulmonary embolism without acute cor pulmonale: Secondary | ICD-10-CM | POA: Diagnosis not present

## 2019-01-16 DIAGNOSIS — I5032 Chronic diastolic (congestive) heart failure: Secondary | ICD-10-CM | POA: Diagnosis not present

## 2019-01-16 DIAGNOSIS — E1165 Type 2 diabetes mellitus with hyperglycemia: Secondary | ICD-10-CM

## 2019-01-16 MED ORDER — HYDROCODONE-ACETAMINOPHEN 5-325 MG PO TABS: 1.0000 | ORAL_TABLET | Freq: Four times a day (QID) | ORAL | 0 refills | Status: DC | PRN

## 2019-01-16 MED ORDER — FUROSEMIDE 40 MG PO TABS: 40.0000 mg | ORAL_TABLET | Freq: Two times a day (BID) | ORAL | 1 refills | Status: DC

## 2019-01-16 MED ORDER — GLIMEPIRIDE 2 MG PO TABS: 2.0000 mg | ORAL_TABLET | Freq: Every day | ORAL | 3 refills | Status: DC

## 2019-01-16 MED ORDER — ATORVASTATIN CALCIUM 10 MG PO TABS: ORAL_TABLET | ORAL | 3 refills | Status: AC

## 2019-01-16 NOTE — Progress Notes (Signed)
Subjective:     Patient ID: Ronald [name], male   DOB: 1945/10/22, 73 y.o.   MRN: [id number]  HPI   Patient seen for hospital follow-up.  He has multiple medical problems and somewhat complicated recent past medical history.  He has history of A. fib and was on Eliquis until he had intracerebral hemorrhage July 2020.  He was taken off Eliquis at that point.  He had presented here with bilateral leg edema and initially we thought this was related to diastolic heart failure and amlodipine and venous stasis.  Over the weekend though he developed increasing leg pain and some confusion and hypoxemia.  His wife took him to the ER for further evaluation and his O2 sats were very low.  Noncontrast head CT negative for acute findings.  Creatinine was up slightly with slightly low potassium.  CT angiogram chest revealed acute PE involving right lower lobe.  Covid testing negative.  Patient was started back on heparin.  He was seen in consultation by neurology who agreed with the reinitiation of anticoagulation.  He had venous Dopplers lower extremities which showed no DVT lower extremities.  He developed some low blood pressure and there were several medication changes made.  His amlodipine was discontinued.  Losartan held and Coreg dosage reduced to 6.25 mg twice daily.  Cardiologist reduced his amiodarone.  He had very low albumin and had repeat labs through neurology which showed increased from albumin 2.1 in hospital to 3.6.  Creatinine up slightly at 1.6.  Still needs flu vaccine  Doing better at this time.  His leg edema significantly improved.  His blood pressures been on the low end but he is not complaining of any dizziness.  No recurrent confusion.  No chest pain.  No dyspnea at rest.    Past Medical History:  Diagnosis Date  . Allergic rhinitis   . Anticoagulant long-term use    eliquis  . Atrial fibrillation ([place])   . 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)    followed by dr harding  .  COPD with emphysema [facility])    pulmologist-  dr Halford Chessman  . Dyspnea    occasional per pt  . Heart disease   . History of colon polyps    tubular adenoma 2013  . History of gout    09-05-2017 last flare-up  05/ 2019 3 wks ago, feet  . History of sepsis 02/18/2017   per d/c note probable uti, acute chf, acute renal failure, hypoxia  . Hyperlipidemia   . Hyperplasia of prostate with lower urinary tract symptoms (LUTS)   . Hypertension   . OSA on CPAP    per study 08-03-2004  Severe OSA  . Persistent atrial fibrillation [facility])    cardiologist --  dr Dorris Carnes--  post cardioversion 05-07-2014  . Prostate cancer [facility]) urologsit-  dr Alyson Ingles--- as of 05-21-2017 per pt last PSA 11 approx.   Dx  2014--  stage T1c, Gleason 3+3=6, PSA 6.67--  Active surveillance/  04/ 2019  Stage T1b, Gleason 3+4, PSA 12.8- plan external radiation therapy    . 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 Seasonal allergies   . Sigmoid diverticulosis   . Systolic and diastolic CHF, chronic [facility])    cardiologist-  [name]  . Tinea versicolor   . Type 2 diabetes mellitus ([place])   . Wears hearing aid in both ears    Past Surgical History:  Procedure Laterality  Date  . CARDIOVERSION N/A 05/07/2014   Procedure: CARDIOVERSION;  Surgeon: [name], MD;  Location: [facility] ENDOSCOPY;  Service: Cardiovascular;  Laterality: N/A;  . CARDIOVERSION N/A 09/09/2014   Procedure: CARDIOVERSION;  Surgeon: Lelon Perla, MD;  Location: [facility] ENDOSCOPY;  Service: Cardiovascular;  Laterality: N/A;  successfully  . CATARACT EXTRACTION W/ INTRAOCULAR LENS  IMPLANT, BILATERAL  08 and 09/  2018  . COLONOSCOPY  last one 06-07-2011  . GOLD SEED IMPLANT N/A 09/09/2017   Procedure: GOLD SEED IMPLANT;  Surgeon: [name], MD;  Location: [facility];  Service: Urology;  Laterality: N/A;  . HYDROCELE EXCISION Bilateral 09/26/2015   Procedure: HYDROCELECTOMY ADULT;   Surgeon: [name], MD;  Location: [facility];  Service: Urology;  Laterality: Bilateral;  . LAMINECTOMY AND MICRODISCECTOMY LUMBAR SPINE  12-23-2003   Left L5 -- S1  . LAPAROSCOPIC BILATERAL INGUINAL HERNIA REPAIR/  UMBILICAL HERNIA REPAIR WITH MESH/  ASPIRATION LEFT HYDROCELE  07-11-2012  . NM MYOVIEW LTD  05/18/2014   Low risk study. Normal perfusion: No ischemia or infarction. Mild LV dysfunction - 46% (does not correlate with echocardiographic EF of 50-55%)  . PROSTATE BIOPSY  05/15/12   Clinically both Lobes  . SPACE OAR INSTILLATION N/A 09/09/2017   Procedure: SPACE OAR INSTILLATION;  Surgeon: [name], MD;  Location: [facility];  Service: Urology;  Laterality: N/A;  . TEE WITHOUT CARDIOVERSION N/A 05/07/2014   Procedure: TRANSESOPHAGEAL ECHOCARDIOGRAM (TEE);  Surgeon: [name], MD;  Location: [facility] ENDOSCOPY;  Service: Cardiovascular;  Laterality: N/A;   mild atherosclerosis plaque of aorta,  mild AR, MR, and TR,  no cardiac source of emboli  . TONSILLECTOMY  as child  . TRANSTHORACIC ECHOCARDIOGRAM  11/'18; 1/'19   a) In setting of sepsis: EF of 40-45%.  Diffuse hypokinesis.  No RWMA.  Biatrial enlargement.;; b) ** f/u Jan 2019: Normal LVF 55-60%** , mildly dilated aortic root(38 mm) and ascending aorta (44 mm). Compared to prior echo, LVEF has improved.  . TRANSTHORACIC ECHOCARDIOGRAM  7/'20; 9/'20   a) Hemorrhagic CVA/SAH - EF> 65%.  Moderate concentric LVH.-GR 1 DD mild RA dilation.; b) EF 60 to 65%.  Mild LVH.  GRII DD.  Normal RV size and function.  Mild LA dilation.  Normal RA size.  Unable to assess PA pressures.  No significant aortic sclerosis.  . TRANSURETHRAL RESECTION OF PROSTATE N/A 05/30/2017   Procedure: TRANSURETHRAL RESECTION OF THE PROSTATE (TURP);  Surgeon: [name], MD;  Location: [facility];  Service: Urology;  Laterality: N/A;    reports that he has been smoking cigarettes. He has a 15.00 pack-year smoking  history. He has never used smokeless tobacco. He reports current alcohol use of about 14.0 standard drinks of alcohol per week. He reports that he does not use drugs. family history includes Breast cancer in his mother; Ovarian cancer in his sister; Stroke in an other family member. No Known Allergies   Review of Systems  Constitutional: Negative for chills and fever.  Respiratory: Negative for cough.   Cardiovascular: Negative for chest pain, palpitations and leg swelling.  Gastrointestinal: Negative for abdominal pain.  Genitourinary: Negative for dysuria.  Neurological: Negative for dizziness and syncope.  Psychiatric/Behavioral: Negative for confusion.       Objective:   Physical Exam Constitutional:      Appearance: Normal appearance.  Cardiovascular:     Rate and Rhythm: Normal rate.  Pulmonary:     Effort:  Pulmonary effort is normal.     Breath sounds: Normal breath sounds.  Musculoskeletal:     Right lower leg: No edema.     Left lower leg: No edema.  Neurological:     General: No focal deficit present.     Mental Status: He is alert.     Cranial Nerves: No cranial nerve deficit.        Assessment:     #1 recent acute pulmonary embolus with acute respiratory failure- symptomatically improved and now back on Eliquis.  #2 bilateral leg edema-improved after discontinuation of amlodipine.  He has had significantly low albumin which is also likely contributing as well as diastolic heart failure  #3 history of atrial fibrillation now back on Eliquis  #4 history of recent intracerebral hemorrhage with recent CT head showing no recurrent bleed  #5 chronic kidney disease stage III  #6 chronic diastolic heart failure  #7 type 2 diabetes with A1c 6.9% back in July  #8 seizures following intracerebral hemorrhage.  Patient on Depakote and followed by neurology  #9 recent acute encephalopathy resolved    Plan:     -He needed several medications refilled including  Lipitor, Lasix, glimepiride and these were all refilled.  He uses hydrocodone very infrequently for severe pain and 1 refill number 20 tablets given -Flu vaccine given -Went over recent medication changes at some length.  They know to monitor blood pressure closely.  They have parameters per cardiology regarding whether to start back low-dose losartan. -Recommend routine follow-up within 1 month and recheck basic metabolic panel and [id number] at that time  [name] MD [facility] [address]

## 2019-01-19 DIAGNOSIS — I69198 Other sequelae of nontraumatic intracerebral hemorrhage: Secondary | ICD-10-CM | POA: Diagnosis not present

## 2019-01-19 DIAGNOSIS — I5032 Chronic diastolic (congestive) heart failure: Secondary | ICD-10-CM | POA: Diagnosis not present

## 2019-01-19 DIAGNOSIS — N183 Chronic kidney disease, stage 3 unspecified: Secondary | ICD-10-CM | POA: Diagnosis not present

## 2019-01-19 DIAGNOSIS — I130 Hypertensive heart and chronic kidney disease with heart failure and stage 1 through stage 4 chronic kidney disease, or unspecified chronic kidney disease: Secondary | ICD-10-CM | POA: Diagnosis not present

## 2019-01-19 DIAGNOSIS — E1122 Type 2 diabetes mellitus with diabetic chronic kidney disease: Secondary | ICD-10-CM | POA: Diagnosis not present

## 2019-01-19 DIAGNOSIS — G40909 Epilepsy, unspecified, not intractable, without status epilepticus: Secondary | ICD-10-CM | POA: Diagnosis not present

## 2019-01-20 NOTE — Telephone Encounter (Signed)
Amlodipine was discontinued: The original prescription was discontinued on 01/02/2019 by [name], MD for the following reason: Error. Renewing this prescription may not be appropriate.

## 2019-01-22 ENCOUNTER — Ambulatory Visit (INDEPENDENT_AMBULATORY_CARE_PROVIDER_SITE_OTHER): Payer: Medicare Other | Admitting: Primary Care

## 2019-01-22 ENCOUNTER — Encounter: Payer: Self-pay | Admitting: Primary Care

## 2019-01-22 ENCOUNTER — Other Ambulatory Visit: Payer: Self-pay

## 2019-01-22 VITALS — BP 108/70 | HR 81 | Temp 97.6°F | Ht 71.0 in | Wt 212.0 lb

## 2019-01-22 DIAGNOSIS — G4733 Obstructive sleep apnea (adult) (pediatric): Secondary | ICD-10-CM | POA: Diagnosis not present

## 2019-01-22 DIAGNOSIS — Z79899 Other long term (current) drug therapy: Secondary | ICD-10-CM

## 2019-01-22 DIAGNOSIS — G40909 Epilepsy, unspecified, not intractable, without status epilepticus: Secondary | ICD-10-CM | POA: Diagnosis not present

## 2019-01-22 DIAGNOSIS — I69198 Other sequelae of nontraumatic intracerebral hemorrhage: Secondary | ICD-10-CM | POA: Diagnosis not present

## 2019-01-22 DIAGNOSIS — I5032 Chronic diastolic (congestive) heart failure: Secondary | ICD-10-CM | POA: Diagnosis not present

## 2019-01-22 DIAGNOSIS — E1122 Type 2 diabetes mellitus with diabetic chronic kidney disease: Secondary | ICD-10-CM | POA: Diagnosis not present

## 2019-01-22 DIAGNOSIS — J84115 Respiratory bronchiolitis interstitial lung disease: Secondary | ICD-10-CM | POA: Diagnosis not present

## 2019-01-22 DIAGNOSIS — M545 Low back pain, unspecified: Secondary | ICD-10-CM

## 2019-01-22 DIAGNOSIS — N183 Chronic kidney disease, stage 3 unspecified: Secondary | ICD-10-CM | POA: Diagnosis not present

## 2019-01-22 DIAGNOSIS — I130 Hypertensive heart and chronic kidney disease with heart failure and stage 1 through stage 4 chronic kidney disease, or unspecified chronic kidney disease: Secondary | ICD-10-CM | POA: Diagnosis not present

## 2019-01-22 DIAGNOSIS — J849 Interstitial pulmonary disease, unspecified: Secondary | ICD-10-CM | POA: Diagnosis not present

## 2019-01-22 DIAGNOSIS — Z87440 Personal history of urinary (tract) infections: Secondary | ICD-10-CM | POA: Diagnosis not present

## 2019-01-22 LAB — URINALYSIS, ROUTINE W REFLEX MICROSCOPIC
Bilirubin Urine: NEGATIVE
Ketones, ur: NEGATIVE
Nitrite: NEGATIVE
Specific Gravity, Urine: 1.01 (ref 1.000–1.030)
Total Protein, Urine: NEGATIVE
Urine Glucose: NEGATIVE
Urobilinogen, UA: 0.2 (ref 0.0–1.0)
pH: 5.5 (ref 5.0–8.0)

## 2019-01-22 NOTE — Patient Instructions (Addendum)
Pleasure meeting you today  Orders: HRCT re: RB-ILD UA C&S re: back pain  Recommendations: Continue to work on quitting smoking (taper amount and pick quit date) We will check a high resolution CT chest to monitor for interstitial lung disease from amiodarone If back pain does not improve please contact PCP  Follow-up: 6 months with Dr. Halford Chessman     Steps to Quit Smoking Smoking tobacco is the leading cause of preventable death. It can affect almost every organ in the body. Smoking puts you and people around you at risk for many serious, long-lasting (chronic) diseases. Quitting smoking can be hard, but it is one of the best things that you can do for your health. It is never too late to quit. How do I get ready to quit? When you decide to quit smoking, make a plan to help you succeed. Before you quit:  Pick a date to quit. Set a date within the next 2 weeks to give you time to prepare.  Write down the reasons why you are quitting. Keep this list in places where you will see it often.  Tell your family, friends, and co-workers that you are quitting. Their support is important.  Talk with your doctor about the choices that may help you quit.  Find out if your health insurance will pay for these treatments.  Know the people, places, things, and activities that make you want to smoke (triggers). Avoid them. What first steps can I take to quit smoking?  Throw away all cigarettes at home, at work, and in your car.  Throw away the things that you use when you smoke, such as ashtrays and lighters.  Clean your car. Make sure to empty the ashtray.  Clean your home, including curtains and carpets. What can I do to help me quit smoking? Talk with your doctor about taking medicines and seeing a counselor at the same time. You are more likely to succeed when you do both.  If you are pregnant or breastfeeding, talk with your doctor about counseling or other ways to quit smoking. Do not take  medicine to help you quit smoking unless your doctor tells you to do so. To quit smoking: Quit right away  Quit smoking totally, instead of slowly cutting back on how much you smoke over a period of time.  Go to counseling. You are more likely to quit if you go to counseling sessions regularly. Take medicine You may take medicines to help you quit. Some medicines need a prescription, and some you can buy over-the-counter. Some medicines may contain a drug called nicotine to replace the nicotine in cigarettes. Medicines may:  Help you to stop having the desire to smoke (cravings).  Help to stop the problems that come when you stop smoking (withdrawal symptoms). Your doctor may ask you to use:  Nicotine patches, gum, or lozenges.  Nicotine inhalers or sprays.  Non-nicotine medicine that is taken by mouth. Find resources Find resources and other ways to help you quit smoking and remain smoke-free after you quit. These resources are most helpful when you use them often. They include:  Online chats with a Social worker.  Phone quitlines.  Printed Furniture conservator/restorer.  Support groups or group counseling.  Text messaging programs.  Mobile phone apps. Use apps on your mobile phone or tablet that can help you stick to your quit plan. There are many free apps for mobile phones and tablets as well as websites. Examples include Quit Guide from the [employer]  and smokefree.gov  What things can I do to make it easier to quit?   Talk to your family and friends. Ask them to support and encourage you.  Call a phone quitline (1-800-QUIT-NOW), reach out to support groups, or work with a Social worker.  Ask people who smoke to not smoke around you.  Avoid places that make you want to smoke, such as: ? Bars. ? Parties. ? Smoke-break areas at work.  Spend time with people who do not smoke.  Lower the stress in your life. Stress can make you want to smoke. Try these things to help your  stress: ? Getting regular exercise. ? Doing deep-breathing exercises. ? Doing yoga. ? Meditating. ? Doing a body scan. To do this, close your eyes, focus on one area of your body at a time from head to toe. Notice which parts of your body are tense. Try to relax the muscles in those areas. How will I feel when I quit smoking? Day 1 to 3 weeks Within the first 24 hours, you may start to have some problems that come from quitting tobacco. These problems are very bad 2-3 days after you quit, but they do not often last for more than 2-3 weeks. You may get these symptoms:  Mood swings.  Feeling restless, nervous, angry, or annoyed.  Trouble concentrating.  Dizziness.  Strong desire for high-sugar foods and nicotine.  Weight gain.  Trouble pooping (constipation).  Feeling like you may vomit (nausea).  Coughing or a sore throat.  Changes in how the medicines that you take for other issues work in your body.  Depression.  Trouble sleeping (insomnia). Week 3 and afterward After the first 2-3 weeks of quitting, you may start to notice more positive results, such as:  Better sense of smell and taste.  Less coughing and sore throat.  Slower heart rate.  Lower blood pressure.  Clearer skin.  Better breathing.  Fewer sick days. Quitting smoking can be hard. Do not give up if you fail the first time. Some people need to try a few times before they succeed. Do your best to stick to your quit plan, and talk with your doctor if you have any questions or concerns. Summary  Smoking tobacco is the leading cause of preventable death. Quitting smoking can be hard, but it is one of the best things that you can do for your health.  When you decide to quit smoking, make a plan to help you succeed.  Quit smoking right away, not slowly over a period of time.  When you start quitting, seek help from your doctor, family, or friends. This information is not intended to replace advice  given to you by your health care provider. Make sure you discuss any questions you have with your health care provider. Document Released: 01/20/2009 Document Revised: 06/13/2018 Document Reviewed: 06/14/2018 Elsevier Patient Education  2020 [employer].

## 2019-01-22 NOTE — Progress Notes (Signed)
_0  ID: Ronald [name], male    DOB: 1945/07/30, 73 y.o.   MRN: [id number]  No chief complaint on file.   Referring provider: [name], MD  HPI: 73 year old male, current everyday smoker. PMH significant for obstructive sleep apnea, RB-ILD, acute respiratory failure with hypoxia, acute pulmonary embolism, cardiomyopathy, chronic diastolic CHF, hypertension, paroxysmal afib (on amiodarone), thoracic ascending aortic aneurysm, type 2 diabetes, prostate cancer. Patient of Dr. Halford Chessman, lasts seen on 05/12/18 for annual visit. Continues smoking and vaping. Maintained on Symbicort 1 puff twice daily. On CPAP therapy for OSA.   01/22/2019 Patient presents today for follow-up PFTs. Recently hospitalized in July for subarachnoid hemorrhage/hypertensive urgency and again in September for pulmonary embolism that occurred after stopping anticoagulation. He is currently back on Eliquis with no bleeding issues. He saw [name] with cardiology 1 week ago for hospital follow-up and to discuss PFT results. PFTs showed worsening DLCO and plan discuss with EP potentially stopping amiodarone.   He is doing well today. He does not report any significant shortness of breath or dyspnea symptoms. Still smoking 1/2 ppd but not vaping. He is deconditioned which contributes to his fatigue, working with physical therapy. Experiencing some posterior right sided back pain for the last day or two. He feels it is musculoskeletal in nature and they plan to follow-up with his PCP if it does not improve. He can not take NSAIDs. He does have a history of UTIs and family agrees to checking urine culture.  Pulmonary function testing: PFT 12/30/18- FVC 3.85 (85); FEV1 2.93 (89%), ratio 76, DLCO 13.13 (50%) Spirometry 2019- FVC 4.05 (89%), FEV1 3.14 (95%), ratio 78, DLCO 15.80 (46%)  Imaging: 01/22/2019 No findings to suggest interstitial lung disease. Previously noted centrilobular ground-glass attenuation  micro nodularity has resolved. Mild diffuse bronchial wall thickening with mild centrilobular and paraseptal emphysema; imaging findings again suggestive of underlying COPD.  01/04/19 CTA- positive for acute PE with evidence of right heart strain consistent with submassive PE. Extensive areas of atelectasis.   03/17/18 HRCT- No findings to suggest interstitial lung disease. Mild diffuse centrilobular ground-glass attenuation micronodules again noted throughout the lungs bilaterally, suggestive of a smoking related disease. Mild diffuse bronchial wall thickening with mild centrilobular and paraseptal emphysema; imaging findings suggestive of underlying COPD.  No Known Allergies  Immunization History  Administered Date(s) Administered   Fluad Quad(high Dose 65+) 01/16/2019   Influenza Split 02/12/2011, 01/08/2012   Influenza Whole 01/08/2008   Influenza, High Dose Seasonal PF 01/02/2016, 12/05/2016, 01/16/2018   Influenza,inj,Quad PF,6+ Mos 04/17/2013, 01/20/2015, 12/22/2015   Pneumococcal Conjugate-13 10/15/2013   Pneumococcal Polysaccharide-23 05/15/2011   Tdap 05/15/2011    Past Medical History:  Diagnosis Date   Allergic rhinitis    Anticoagulant long-term use    eliquis   Atrial fibrillation ([place])    Cardiomyopathy due to systemic disease ([place])    followed by dr harding   COPD with emphysema [facility])    pulmologist-  dr Halford Chessman   Dyspnea    occasional per pt   Heart disease    History of colon polyps    tubular adenoma 2013   History of gout    09-05-2017 last flare-up  05/ 2019 3 wks ago, feet   History of sepsis 02/18/2017   per d/c note probable uti, acute chf, acute renal failure, hypoxia   Hyperlipidemia    Hyperplasia of prostate with lower urinary tract symptoms (LUTS)    Hypertension    OSA on CPAP  per study 08-03-2004  Severe OSA   Persistent atrial fibrillation [facility])    cardiologist --  dr Dorris Carnes--  post cardioversion 05-07-2014    Prostate cancer [facility]) urologsit-  dr Alyson Ingles--- as of 05-21-2017 per pt last PSA 11 approx.   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 ([place])    pulmologist-  dr Halford Chessman   Seasonal allergies    Sigmoid diverticulosis    Systolic and diastolic CHF, chronic [facility])    cardiologist-  dr harding   Tinea versicolor    Type 2 diabetes mellitus ([place])    Wears hearing aid in both ears     Tobacco History: Social History   Tobacco Use  Smoking Status Current Every Day Smoker   Packs/day: 0.25   Years: 60.00   Pack years: 15.00   Types: Cigarettes   Last attempt to quit: 04/11/2018   Years since quitting: 0.7  Smokeless Tobacco Never Used  Tobacco Comment   09-05-2017 tried quitting smoking 11/ 2018 but started back at 0.75ppd from 1ppd   Ready to quit: Not Answered Counseling given: Not Answered Comment: 09-05-2017 tried quitting smoking 11/ 2018 but started back at 0.75ppd from 1ppd   Outpatient Medications Prior to Visit  Medication Sig Dispense Refill   albuterol (VENTOLIN HFA) 108 (90 Base) MCG/ACT inhaler Inhale 2 puffs into the lungs every 6 (six) hours as needed for wheezing or shortness of breath. 1 Inhaler 5   amiodarone (PACERONE) 200 MG tablet Take 0.5 tablets (100 mg total) by mouth daily. 90 tablet 2   apixaban (ELIQUIS) 5 MG TABS tablet Take 1 tablet (5 mg total) by mouth 2 (two) times daily. 60 tablet 0   atorvastatin (LIPITOR) 10 MG tablet TAKE 1 TABLET BY MOUTH DAILY EACH EVENING 90 tablet 3   azelastine (ASTELIN) 0.1 % nasal spray Place 1 spray into both nostrils 2 (two) times daily as needed for rhinitis or allergies. 30 mL 12   Blood Glucose Monitoring Suppl (ACCU-CHEK AVIVA PLUS) w/Device KIT Use blood glucose machine as instructed on your strips 1 kit 1   budesonide-formoterol  (SYMBICORT) 160-4.5 MCG/ACT inhaler Inhale 2 puffs into the lungs 2 (two) times daily. 1 Inhaler 12   carvedilol (COREG) 6.25 MG tablet Take 1 tablet (6.25 mg total) by mouth 2 (two) times daily. 180 tablet 3   colchicine 0.6 MG tablet Take 1 tablet (0.6 mg total) by mouth 2 (two) times daily as needed (gout flare). 30 tablet 0   diclofenac sodium (VOLTAREN) 1 % GEL Apply 4 g topically 4 (four) times daily. (Patient taking differently: Apply 4 g topically 4 (four) times daily as needed (foot pain). ) 4 g 1   divalproex (DEPAKOTE) 500 MG DR tablet Take 1 tablet (500 mg total) by mouth every 12 (twelve) hours. 180 tablet 1   furosemide (LASIX) 40 MG tablet Take 1 tablet (40 mg total) by mouth 2 (two) times daily with breakfast and lunch. 180 tablet 1   glimepiride (AMARYL) 2 MG tablet Take 1 tablet (2 mg total) by mouth daily with breakfast. 90 tablet 3   glucose blood (ACCU-CHEK AVIVA PLUS) test strip Test 2 times daily dx e11.9 100 each 3   HYDROcodone-acetaminophen (NORCO/VICODIN) 5-325 MG tablet Take 1 tablet by mouth every 6 (six) hours as needed for moderate pain. [place]  tablet 0   Lancets (ACCU-CHEK SOFT TOUCH) lancets Check blood sugars once per day. DX: E11.9 100 each 2   metFORMIN (GLUCOPHAGE) 500 MG tablet TAKE 2 TABLETS (1,000 MG TOTAL) BY MOUTH 2 (TWO) TIMES DAILY WITH A MEAL. (Patient taking differently: 1 tablet BID) 360 tablet 0   montelukast (SINGULAIR) 10 MG tablet Take 1 tablet (10 mg total) by mouth every evening. 30 tablet 0   PRESCRIPTION MEDICATION Inhale into the lungs at bedtime. CPAP     QUEtiapine (SEROQUEL) 25 MG tablet Take 1 tablet (25 mg total) by mouth at bedtime. 30 tablet 2   No facility-administered medications prior to visit.    Review of Systems  Review of Systems  Constitutional: Positive for fatigue.  Respiratory: Negative for cough, shortness of breath and wheezing.   Cardiovascular: Negative.  Negative for chest pain and leg swelling.    Neurological: Positive for weakness.   Physical Exam  BP 108/70 (BP Location: Right Arm, Cuff Size: Normal)    Pulse 81    Temp 97.6 F (36.4 C) (Temporal)    Ht _0  (1.803 m)    Wt 212 lb (96.2 kg)    SpO2 97%    BMI 29.57 kg/m  Physical Exam Constitutional:      Appearance: Normal appearance.     Comments: Well appearing  Cardiovascular:     Rate and Rhythm: Normal rate and regular rhythm.     Comments: Regular rhythm  Pulmonary:     Effort: Pulmonary effort is normal.     Breath sounds: Normal breath sounds. No wheezing or rales.     Comments: CTA, no crackles noted  Musculoskeletal:     Comments: Deconditioned   Neurological:     Mental Status: He is alert.  Psychiatric:        Mood and Affect: Mood normal.        Behavior: Behavior normal.        Thought Content: Thought content normal.        Judgment: Judgment normal.      Lab Results:  CBC    Component Value Date/Time   WBC 5.6 01/09/2019 0421   RBC 3.08 (L) 01/09/2019 0421   HGB 10.1 (L) 01/09/2019 0421   HCT 30.2 (L) 01/09/2019 0421   PLT 338 01/09/2019 0421   MCV 98.1 01/09/2019 0421   MCH 32.8 01/09/2019 0421   MCHC 33.4 01/09/2019 0421   RDW 13.6 01/09/2019 0421   LYMPHSABS 0.9 01/03/2019 2053   MONOABS 0.8 01/03/2019 2053   EOSABS 0.1 01/03/2019 2053   BASOSABS 0.0 01/03/2019 2053    BMET    Component Value Date/Time   NA 137 01/13/2019 1017   K 3.9 01/13/2019 1017   CL 95 (L) 01/13/2019 1017   CO2 24 01/13/2019 1017   GLUCOSE 151 (H) 01/13/2019 1017   GLUCOSE 147 (H) 01/09/2019 0421   BUN 21 01/13/2019 1017   CREATININE 1.60 (H) 01/13/2019 1017   CREATININE 0.96 03/21/2015 1022   CALCIUM 9.0 01/13/2019 1017   GFRNONAA 42 (L) 01/13/2019 1017   GFRNONAA 80 03/21/2015 1022   GFRAA 49 (L) 01/13/2019 1017   GFRAA >89 03/21/2015 1022    BNP    Component Value Date/Time   BNP 159.4 (H) 01/03/2019 2053    ProBNP    Component Value Date/Time   PROBNP 16.0 05/13/2017 0815     Imaging: Ct Head Wo Contrast  Result Date: 01/08/2019 CLINICAL DATA:  History of intracranial haemorrhage, recently  started on heparin drip for PE, intracranial hemorrhage, known, follow-up. EXAM: CT HEAD WITHOUT CONTRAST TECHNIQUE: Contiguous axial images were obtained from the base of the skull through the vertex without intravenous contrast. COMPARISON:  Head CT examinations 01/03/2019 and early FINDINGS: Brain: Developing encephalomalacia/gliosis and likely persistent edema at site of a previous paramedian anterior right frontal lobe parenchymal hemorrhage. No evidence of acute intracranial hemorrhage. No evidence of intracranial mass. No midline shift or extra-axial fluid collection. Stable generalized atrophy and chronic small vessel ischemic disease. Partially empty sella turcica. Vascular: No hyperdense vessel. Atherosclerotic calcification of the carotid artery siphons and vertebrobasilar system. Skull: Normal. Negative for fracture or focal lesion. Sinuses/Orbits: Visualized orbits demonstrate no acute abnormality. Mild scattered paranasal sinus mucosal thickening. No significant mastoid effusion. IMPRESSION: 1. Developing encephalomalacia/gliosis and likely persistent edema at site of a previous paramedian anterior right frontal lobe parenchymal hemorrhage. No evidence of acute intracranial hemorrhage. 2. Stable generalized atrophy and chronic small vessel ischemic disease. Electronically Signed   By: Kellie Simmering   On: 01/08/2019 09:11   Ct Head Wo Contrast  Result Date: 01/03/2019 CLINICAL DATA:  Altered level of consciousness. EXAM: CT HEAD WITHOUT CONTRAST TECHNIQUE: Contiguous axial images were obtained from the base of the skull through the vertex without intravenous contrast. COMPARISON:  Head CT and brain MRI 11/22/2018 FINDINGS: Brain: Expected contraction of subacute parafalcine right frontal hematoma with developing encephalomalacia. No new or acute hemorrhage. No evidence of acute  ischemia. No hydrocephalus. No midline shift. Stable generalized cerebral atrophy and chronic small vessel ischemia. Vascular: Atherosclerosis of skullbase vasculature without hyperdense vessel or abnormal calcification. Skull: No fracture or focal lesion. Sinuses/Orbits: Paranasal sinuses and mastoid air cells are clear. The visualized orbits are unremarkable. Bilateral lens extraction. Other: None. IMPRESSION: 1. No acute intracranial abnormality. 2. Expected contraction of subacute parafalcine right frontal hematoma over the last 6 weeks with encephalomalacia. 3. Stable atrophy and chronic small vessel ischemia. Electronically Signed   By: Keith Rake M.D.   On: 01/03/2019 21:45   Ct Angio Chest Pe W And/or Wo Contrast  Addendum Date: 01/04/2019   ADDENDUM REPORT: 01/04/2019 00:50 ADDENDUM: Dilation of the ascending thoracic aorta to 4.1 cm. Recommend annual imaging followup by CTA or MRA. This recommendation follows 2010 ACCF/AHA/AATS/ACR/ASA/SCA/SCAI/SIR/STS/SVM Guidelines for the Diagnosis and Management of Patients with Thoracic Aortic Disease. Circulation. 2010; 121: U235-T614. Aortic aneurysm NOS (ICD10-I71.9) Electronically Signed   By: Lovena Le M.D.   On: 01/04/2019 00:50   Result Date: 01/04/2019 CLINICAL DATA:  Altered mental status, starting mid day, abdominal pain and distension, PE suspected, positive D-dimer EXAM: CT ANGIOGRAPHY CHEST WITH CONTRAST TECHNIQUE: Multidetector CT imaging of the chest was performed using the standard protocol during bolus administration of intravenous contrast. Multiplanar CT image reconstructions and MIPs were obtained to evaluate the vascular anatomy. CONTRAST:  26m OMNIPAQUE IOHEXOL 350 MG/ML SOLN COMPARISON:  CT chest 01/31/2015 FINDINGS: Cardiovascular: Satisfactory opacification of pulmonary arteries to the segmental level. Filling defects are present in the pulmonary arteries of the superior segment and posterior basal segment of the right lower  lobe. No other visible pulmonary artery filling defects are evident. There is central pulmonary arterial enlargement, increased from comparison study. There is borderline elevation of the RV/LV ratio (0.95) with flattening of the intraventricular septum. Borderline cardiomegaly. No pericardial effusion. Ascending aorta is dilated to 4.1 cm. There is atherosclerotic calcification of the aortic arch and proximal great vessels. Suboptimal contrast opacification of the aorta limits luminal evaluation. Mediastinum/Nodes: No enlarged mediastinal  or axillary lymph nodes. Thyroid gland, trachea, and esophagus demonstrate no significant findings. Lungs/Pleura: Lung volumes are diminished with extensive areas of atelectasis throughout both lungs. More subpleural reticulation is noted in the upper lobes with some peribronchial thickening and bronchiectatic changes throughout both lungs. Subpleural cyst noted in the right lower lung. A previously seen 5 mm nodule in the left lower lobe is poorly visualized on today's exam due to dependent atelectasis in the lung base. Lobular 12 mm nodule in the lingula associated with a bandlike area of scarring may reflect rounded atelectasis, unchanged from prior. Upper Abdomen: No acute abnormalities present in the visualized portions of the upper abdomen. Abdominal aortic atherosclerosis is evident. Musculoskeletal: No acute osseous abnormality or suspicious osseous lesion. Multilevel degenerative changes are present in the imaged portions of the spine. Review of the MIP images confirms the above findings. IMPRESSION: 1. Positive for acute PE with CT evidence of right heart strain (RV/LV Ratio = 0.95, flattening of the intraventricular septum, central pulmonary artery enlargement) consistent with at least submassive (intermediate risk) PE. The presence of right heart strain has been associated with an increased risk of morbidity and mortality. Please activate Code PE by paging  [phone]. 2. Extensive areas of atelectasis likely accentuated by imaging during exhalation. 3. Suspect a region of rounded atelectasis or scarring within the lingula, stable from comparison exam in 2019. 4.  Aortic Atherosclerosis (ICD10-I70.0).  Coronary atherosclerosis. Critical Value/emergent results were called by telephone at the time of interpretation on 01/04/2019 at 12:05 am to providerCardama MD, who verbally acknowledged these results. Electronically Signed: By: Lovena Le M.D. On: 01/04/2019 00:08   Ct Chest High Resolution  Result Date: 01/23/2019 CLINICAL DATA:  74 year old male with history of smoking. Evaluate for interstitial lung disease. EXAM: CT CHEST WITHOUT CONTRAST TECHNIQUE: Multidetector CT imaging of the chest was performed following the standard protocol without intravenous contrast. High resolution imaging of the lungs, as well as inspiratory and expiratory imaging, was performed. COMPARISON:  Chest CT 01/03/2019. FINDINGS: Cardiovascular: Heart size is normal. There is no significant pericardial fluid, thickening or pericardial calcification. There is aortic atherosclerosis, as well as atherosclerosis of the great vessels of the mediastinum and the coronary arteries, including calcified atherosclerotic plaque in the left main, left anterior descending, circumflex and right coronary arteries. Ectasia of the ascending thoracic aorta (4.4 cm in diameter). Mild calcifications of the aortic valve. Mediastinum/Nodes: No pathologically enlarged mediastinal or hilar lymph nodes. Please note that accurate exclusion of hilar adenopathy is limited on noncontrast CT scans. Esophagus is unremarkable in appearance. No axillary lymphadenopathy. Lungs/Pleura: High-resolution images demonstrates areas of dependent ground-glass attenuation in the lower lobes of the lungs bilaterally, favored to reflect areas of mild dependent subsegmental atelectasis. There are otherwise no generalized regions  of ground-glass attenuation, septal thickening, subpleural reticulation, parenchymal banding, traction bronchiectasis or frank honeycombing to suggest interstitial lung disease. Previously noted centrilobular ground-glass attenuation micro nodules are no longer evident. Inspiratory and expiratory imaging demonstrates mild air trapping indicative of small airways disease. Mild diffuse bronchial wall thickening with mild centrilobular and paraseptal emphysema. No acute consolidative airspace disease. No pleural effusions. No suspicious appearing pulmonary nodules or masses are noted. Upper Abdomen: Aortic atherosclerosis. Musculoskeletal: There are no aggressive appearing lytic or blastic lesions noted in the visualized portions of the skeleton. IMPRESSION: 1. No findings to suggest interstitial lung disease. 2. Previously noted centrilobular ground-glass attenuation micro nodularity has resolved. 3. Mild diffuse bronchial wall thickening with mild centrilobular and paraseptal emphysema; imaging  findings again suggestive of underlying COPD. 4. Aortic atherosclerosis, in addition to left main and 3 vessel coronary artery disease. Assessment for potential risk factor modification, dietary therapy or pharmacologic therapy may be warranted, if clinically indicated. 5. Ectasia of ascending thoracic aorta (4.4 cm in diameter). Recommend annual imaging followup by CTA or MRA. This recommendation follows 2010 ACCF/AHA/AATS/ACR/ASA/SCA/SCAI/SIR/STS/SVM Guidelines for the Diagnosis and Management of Patients with Thoracic Aortic Disease. Circulation. 2010; 121: G182-X937. Aortic aneurysm NOS (ICD10-I71.9). 6. There are calcifications of the aortic valve. Echocardiographic correlation for evaluation of potential valvular dysfunction may be warranted if clinically indicated. Aortic Atherosclerosis (ICD10-I70.0) and Emphysema (ICD10-J43.9). Electronically Signed   By: Vinnie Langton M.D.   On: 01/23/2019 11:07   Dg Chest Port  1 View  Result Date: 01/06/2019 CLINICAL DATA:  Fevers, known pulmonary emboli EXAM: PORTABLE CHEST 1 VIEW COMPARISON:  01/03/2019 FINDINGS: Cardiac shadow is mildly prominent. Lungs are well aerated bilaterally. No focal infiltrate or sizable effusion is seen. The overall appearance is stable from the prior exam. IMPRESSION: No acute abnormality noted. Electronically Signed   By: Inez Catalina M.D.   On: 01/06/2019 09:53   Dg Chest Port 1 View  Result Date: 01/03/2019 CLINICAL DATA:  Altered mental status. EXAM: PORTABLE CHEST 1 VIEW COMPARISON:  Chest radiograph 07/26/2017. There is a lesion chest CT 03/17/2018. FINDINGS: Low lung volumes. Cardiomegaly. No obvious pulmonary edema. Left basilar evaluation is limited due to soft tissue attenuation. No evidence of acute airspace disease or pneumothorax. IMPRESSION: 1. Low lung volumes limit assessment, particularly at the left lung base. 2. Cardiomegaly. Electronically Signed   By: Keith Rake M.D.   On: 01/03/2019 21:13   Vas [place] Lower Extremity Venous (dvt)  Result Date: 01/05/2019  Lower Venous Study Indications: Pain, Swelling, and pulmonary embolism.  Risk Factors: Confirmed PE. Limitations: Body habitus and movement of the legs. Performing Technologist: Toma Copier RVS  Examination Guidelines: A complete evaluation includes B-mode imaging, spectral Doppler, color Doppler, and power Doppler as needed of all accessible portions of each vessel. Bilateral testing is considered an integral part of a complete examination. Limited examinations for reoccurring indications may be performed as noted.  +---------+---------------+---------+-----------+----------+--------------+  RIGHT     Compressibility Phasicity Spontaneity Properties Thrombus Aging  +---------+---------------+---------+-----------+----------+--------------+  CFV       Full            Yes       Yes                                     +---------+---------------+---------+-----------+----------+--------------+  SFJ       Full                                                             +---------+---------------+---------+-----------+----------+--------------+  FV Prox   Full            Yes       Yes                                    +---------+---------------+---------+-----------+----------+--------------+  FV Mid    Full                                                             +---------+---------------+---------+-----------+----------+--------------+  FV Distal Full            Yes       Yes                                    +---------+---------------+---------+-----------+----------+--------------+  PFV       Full            Yes       Yes                                    +---------+---------------+---------+-----------+----------+--------------+  POP       Full            Yes       Yes                                    +---------+---------------+---------+-----------+----------+--------------+  PTV       Full                                                             +---------+---------------+---------+-----------+----------+--------------+  PERO      Full                                                             +---------+---------------+---------+-----------+----------+--------------+   +---------+---------------+---------+-----------+----------+--------------+  LEFT      Compressibility Phasicity Spontaneity Properties Thrombus Aging  +---------+---------------+---------+-----------+----------+--------------+  CFV       Full            Yes       Yes                                    +---------+---------------+---------+-----------+----------+--------------+  SFJ       Full                                                             +---------+---------------+---------+-----------+----------+--------------+  FV Prox   Full            Yes       Yes                                     +---------+---------------+---------+-----------+----------+--------------+  FV Mid    Full                                                             +---------+---------------+---------+-----------+----------+--------------+  FV Distal Full            Yes       Yes                                    +---------+---------------+---------+-----------+----------+--------------+  PFV       Full            Yes       Yes                                    +---------+---------------+---------+-----------+----------+--------------+  POP       Full            Yes       Yes                                    +---------+---------------+---------+-----------+----------+--------------+  PTV       Full                                                             +---------+---------------+---------+-----------+----------+--------------+  PERO      Full                                                             +---------+---------------+---------+-----------+----------+--------------+     Summary: Right: There is no evidence of deep vein thrombosis in the lower extremity. No cystic structure found in the popliteal fossa. Left: There is no evidence of deep vein thrombosis in the lower extremity. No cystic structure found in the popliteal fossa.  *See table(s) above for measurements and observations. Electronically signed by Ruta Hinds MD on 01/05/2019 at 4:10:46 PM.    Final      Assessment & Plan:   On amiodarone therapy - No evidence of ILD on HRCT 01/22/2019 - DLCO is stable percentage wise, although it did decrease from 15.80 to 13.13 which could likely be from evidence of centrilobular and paraseptal emphysema on CT imaging  - Discussed with Dr. 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 ([place]) - Patient stopped vaping, continues to smoke 1/2 ppd - Previously noted centrilobular ground-glass attenuation  micro nodularity has resolved   Obstructive sleep apnea - Airview download reviewed, patient is compliant with CPAP use and reports benefit from therapy  - FU in 6 months  Acute pulmonary embolism ([place]) - Hospitalized in September for PE after stopping anticoagulation d/t subarachnoid hemorrhage/hypertensive urgency in July - Currently back on Eliquis 80m twice daily (managed by PCP)  Back pain - Acute right side lower back pain x1-2 days - UA showed leuks and bacteria, awaiting urine culture - If not d/t UTI and symptoms do not improve follow up with PCP    EMartyn Ehrich NP 01/23/2019

## 2019-01-23 ENCOUNTER — Ambulatory Visit (HOSPITAL_BASED_OUTPATIENT_CLINIC_OR_DEPARTMENT_OTHER)
Admission: RE | Admit: 2019-01-23 | Discharge: 2019-01-23 | Disposition: A | Discharge status: Home / Self Care | Payer: Medicare Other | Source: Ambulatory Visit | Attending: Primary Care | Admitting: Primary Care

## 2019-01-23 DIAGNOSIS — J849 Interstitial pulmonary disease, unspecified: Secondary | ICD-10-CM | POA: Diagnosis not present

## 2019-01-23 DIAGNOSIS — M549 Dorsalgia, unspecified: Secondary | ICD-10-CM | POA: Insufficient documentation

## 2019-01-23 DIAGNOSIS — J439 Emphysema, unspecified: Secondary | ICD-10-CM | POA: Diagnosis not present

## 2019-01-23 NOTE — Progress Notes (Signed)
Patient was having back pain, awaiting culture

## 2019-01-23 NOTE — Assessment & Plan Note (Addendum)
-   Airview download reviewed, patient is compliant with CPAP use and reports benefit from therapy  - FU in 6 months

## 2019-01-23 NOTE — Assessment & Plan Note (Signed)
-   Hospitalized in September for PE after stopping anticoagulation d/t subarachnoid hemorrhage/hypertensive urgency in July - Currently back on Eliquis 5mg  twice daily (managed by PCP)

## 2019-01-23 NOTE — Assessment & Plan Note (Addendum)
-   Patient stopped vaping, continues to smoke 1/2 ppd - Previously noted centrilobular ground-glass attenuation micro nodularity has resolved

## 2019-01-23 NOTE — Assessment & Plan Note (Signed)
-   Acute right side lower back pain x1-2 days - UA showed leuks and bacteria, awaiting urine culture - If not d/t UTI and symptoms do not improve follow up with PCP

## 2019-01-23 NOTE — Assessment & Plan Note (Addendum)
-   No evidence of ILD on HRCT 01/22/2019 - DLCO is stable percentage wise, although it did decrease from 15.80 to 13.13 which could likely be from evidence of centrilobular and paraseptal emphysema on CT imaging  - Discussed with Dr. Halford Chessman, okay for patient to continue amiodarone therapy per cardiology

## 2019-01-23 NOTE — Progress Notes (Signed)
Reviewed and agree with assessment/plan.   Margi Edmundson, MD [facility] 04/04/2016, 12:24 PM Pager:  [phone]  

## 2019-01-23 NOTE — Progress Notes (Signed)
Please let patient know HRCT was reassuring. Showed no evidence of interstitial lung disease related to medication use. Previous ground-glass attenuation micro-nodularity has resolved. I do not recommend stopping amiodarone. Continue to monitor PFTs with DLCO. Will send note and results to his cardiologist.   Incidental; findings, aortic atherosclerosis. ectasia of ascending thoracic aorta and calcifications of the aortic valve. Follow up with cardiology as he is doing.

## 2019-01-23 NOTE — Progress Notes (Signed)
Both patient and spouse made aware of CT findings and recommendations. He is aware copy sent to cardiology and continue f/u with cardiology. Nothing further needed at this time.

## 2019-01-24 LAB — URINE CULTURE
MICRO NUMBER:: 993867
SPECIMEN QUALITY:: ADEQUATE

## 2019-01-26 ENCOUNTER — Telehealth: Payer: Self-pay | Admitting: *Deleted

## 2019-01-26 ENCOUNTER — Encounter: Payer: Medicare Other | Admitting: Physical Medicine & Rehabilitation

## 2019-01-26 DIAGNOSIS — N183 Chronic kidney disease, stage 3 unspecified: Secondary | ICD-10-CM | POA: Diagnosis not present

## 2019-01-26 DIAGNOSIS — E1122 Type 2 diabetes mellitus with diabetic chronic kidney disease: Secondary | ICD-10-CM | POA: Diagnosis not present

## 2019-01-26 DIAGNOSIS — I69198 Other sequelae of nontraumatic intracerebral hemorrhage: Secondary | ICD-10-CM | POA: Diagnosis not present

## 2019-01-26 DIAGNOSIS — I130 Hypertensive heart and chronic kidney disease with heart failure and stage 1 through stage 4 chronic kidney disease, or unspecified chronic kidney disease: Secondary | ICD-10-CM | POA: Diagnosis not present

## 2019-01-26 DIAGNOSIS — I5032 Chronic diastolic (congestive) heart failure: Secondary | ICD-10-CM | POA: Diagnosis not present

## 2019-01-26 DIAGNOSIS — G40909 Epilepsy, unspecified, not intractable, without status epilepticus: Secondary | ICD-10-CM | POA: Diagnosis not present

## 2019-01-26 MED ORDER — CEPHALEXIN 500 MG PO CAPS: 500.0000 mg | ORAL_CAPSULE | Freq: Three times a day (TID) | ORAL | 0 refills | Status: DC

## 2019-01-26 NOTE — Telephone Encounter (Signed)
Attempted to reach pt, no answer. Left vm to call back just informing pt medication was sent to his pharmacy.

## 2019-01-26 NOTE — Telephone Encounter (Signed)
Pt's wife returned a call and I informed her of the message per Dr. Elease Hashimoto. They had no additional questions at this time. Nothing further is needed

## 2019-01-26 NOTE — Progress Notes (Signed)
Please let patient know urine culture was positive, if he is still having symptoms have him discuss results with PCP. If unable let us know and I can send something in

## 2019-01-26 NOTE — Progress Notes (Signed)
Pt aware. He has left message for pcp. Pt notified to contact our office back if he has any issues. Nothing further needed.

## 2019-01-26 NOTE — Telephone Encounter (Signed)
Copied from [place] [id number]. Topic: General - Other >> Jan 26, 2019  9:38 AM Pauline Good wrote: Reason for CRM: pt went to pulmonary last week for UTI and need to speak to nurse concerning it

## 2019-01-26 NOTE — Telephone Encounter (Signed)
Start Keflex 500 mg 3 times daily for 7 days

## 2019-01-26 NOTE — Telephone Encounter (Signed)
Lab result is in epic. Per pt they did not send in any antibiotics and he wanted to know what you wanted to do

## 2019-01-28 ENCOUNTER — Other Ambulatory Visit: Payer: Self-pay | Admitting: Family Medicine

## 2019-01-29 DIAGNOSIS — I69198 Other sequelae of nontraumatic intracerebral hemorrhage: Secondary | ICD-10-CM | POA: Diagnosis not present

## 2019-01-29 DIAGNOSIS — I130 Hypertensive heart and chronic kidney disease with heart failure and stage 1 through stage 4 chronic kidney disease, or unspecified chronic kidney disease: Secondary | ICD-10-CM | POA: Diagnosis not present

## 2019-01-29 DIAGNOSIS — G40909 Epilepsy, unspecified, not intractable, without status epilepticus: Secondary | ICD-10-CM | POA: Diagnosis not present

## 2019-01-29 DIAGNOSIS — E1122 Type 2 diabetes mellitus with diabetic chronic kidney disease: Secondary | ICD-10-CM | POA: Diagnosis not present

## 2019-01-29 DIAGNOSIS — N183 Chronic kidney disease, stage 3 unspecified: Secondary | ICD-10-CM | POA: Diagnosis not present

## 2019-01-29 DIAGNOSIS — I5032 Chronic diastolic (congestive) heart failure: Secondary | ICD-10-CM | POA: Diagnosis not present

## 2019-02-01 ENCOUNTER — Other Ambulatory Visit: Payer: Self-pay | Admitting: Family Medicine

## 2019-02-02 ENCOUNTER — Encounter: Payer: Self-pay | Admitting: Cardiology

## 2019-02-02 ENCOUNTER — Telehealth: Payer: Self-pay | Admitting: Cardiology

## 2019-02-02 NOTE — Telephone Encounter (Signed)
New Message    Patient states he has cognitive issues and needs his wife at the appointment with him.  Please call to discuss.

## 2019-02-02 NOTE — Telephone Encounter (Signed)
Called pt and informed that I have noted that wife will accompany him tomorrow. Patient verbalized understanding and agreeable to plan.

## 2019-02-03 ENCOUNTER — Ambulatory Visit (INDEPENDENT_AMBULATORY_CARE_PROVIDER_SITE_OTHER): Payer: Medicare Other | Admitting: Cardiology

## 2019-02-03 ENCOUNTER — Encounter: Payer: Self-pay | Admitting: Cardiology

## 2019-02-03 ENCOUNTER — Other Ambulatory Visit: Payer: Self-pay

## 2019-02-03 ENCOUNTER — Telehealth: Payer: Self-pay | Admitting: *Deleted

## 2019-02-03 VITALS — BP 128/72 | HR 72 | Ht 71.0 in | Wt 218.4 lb

## 2019-02-03 DIAGNOSIS — I480 Paroxysmal atrial fibrillation: Secondary | ICD-10-CM | POA: Diagnosis not present

## 2019-02-03 NOTE — Telephone Encounter (Signed)
Informed that Dr. Curt Bears and pulmonology discussed.   Aware ok to continue Amiodarone. Patient verbalized understanding and agreeable to plan.

## 2019-02-03 NOTE — Progress Notes (Signed)
Electrophysiology Office Note   Date:  02/03/2019   ID:  Darryon, Bastin July 02, 1945, MRN [id number]  PCP:  [name], MD  Cardiologist:  Ellyn Hack Primary Electrophysiologist:  Arrie Zuercher Meredith Leeds, MD    Chief Complaint: AF   History of Present Illness: EMETERIO BALKE is a 73 y.o. male who is being seen today for the evaluation of AF at the request of Leonie Man, MD. Presenting today for electrophysiology evaluation.  He has a history significant for persistent atrial fibrillation, CHF, COPD, OSA on CPAP, and diabetes.  He has had a subarachnoid hemorrhage which was complicated by pulmonary embolism that occurred after cessation of anticoagulation.  July 2020, he felt sick with generalized tonic-clonic seizure lasting approximately 1 minute.  CT scan showed a small subarachnoid hemorrhage and was noted to have hypertensive urgency.  Eliquis was discontinued and he was loaded with Keppra for seizures.  He represented August 2020 with mental status changes and unsteady gait.  He was converted to Depakote.  He presented to the hospital 01/03/2019 with lower leg pain and confusion.  Chest CT showed a pulmonary embolism in the right lower lobe with evidence of right heart strain.  He was started back on Eliquis at the time.  Today, he denies symptoms of palpitations, chest pain, shortness of breath, orthopnea, PND, lower extremity edema, claudication, dizziness, presyncope, syncope, bleeding, or neurologic sequela. The patient is tolerating medications without difficulties.  He is comfortable with his dose of amiodarone.  He says he has not had any issues that he knows of.  He feels that he has been in sinus rhythm since the spring 2018.   Past Medical History:  Diagnosis Date  . Allergic rhinitis   . Anticoagulant long-term use    eliquis  . Atrial fibrillation ([place])   . Cardiomyopathy due to systemic disease [facility])    followed by dr harding  . COPD with emphysema [facility])     pulmologist-  dr Halford Chessman  . Dyspnea    occasional per pt  . Heart disease   . History of colon polyps    tubular adenoma 2013  . History of gout    09-05-2017 last flare-up  05/ 2019 3 wks ago, feet  . History of sepsis 02/18/2017   per d/c note probable uti, acute chf, acute renal failure, hypoxia  . Hyperlipidemia   . Hyperplasia of prostate with lower urinary tract symptoms (LUTS)   . Hypertension   . OSA on CPAP    per study 08-03-2004  Severe OSA  . Persistent atrial fibrillation [facility])    cardiologist --  dr Dorris Carnes--  post cardioversion 05-07-2014  . Prostate cancer [facility]) urologsit-  dr Alyson Ingles--- as of 05-21-2017 per pt last PSA 11 approx.   Dx  2014--  stage T1c, Gleason 3+3=6, PSA 6.67--  Active surveillance/  04/ 2019  Stage T1b, Gleason 3+4, PSA 12.8- plan external radiation therapy    . 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 Seasonal allergies   . Sigmoid diverticulosis   . Systolic and diastolic CHF, chronic [facility])    cardiologist-  [name]  . Tinea versicolor   . Type 2 diabetes mellitus ([place])   . Wears hearing aid in both ears    Past Surgical History:  Procedure Laterality Date  . CARDIOVERSION N/A 05/07/2014   Procedure: CARDIOVERSION;  Surgeon: [name], MD;  Location: [place];  Service:  Cardiovascular;  Laterality: N/A;  . CARDIOVERSION N/A 09/09/2014   Procedure: CARDIOVERSION;  Surgeon: Lelon Perla, MD;  Location: [facility] ENDOSCOPY;  Service: Cardiovascular;  Laterality: N/A;  successfully  . CATARACT EXTRACTION W/ INTRAOCULAR LENS  IMPLANT, BILATERAL  08 and 09/  2018  . COLONOSCOPY  last one 06-07-2011  . GOLD SEED IMPLANT N/A 09/09/2017   Procedure: GOLD SEED IMPLANT;  Surgeon: [name], MD;  Location: [facility];  Service: Urology;  Laterality: N/A;  . HYDROCELE EXCISION Bilateral 09/26/2015   Procedure: HYDROCELECTOMY ADULT;  Surgeon: [name], MD;   Location: [facility];  Service: Urology;  Laterality: Bilateral;  . LAMINECTOMY AND MICRODISCECTOMY LUMBAR SPINE  12-23-2003   Left L5 -- S1  . LAPAROSCOPIC BILATERAL INGUINAL HERNIA REPAIR/  UMBILICAL HERNIA REPAIR WITH MESH/  ASPIRATION LEFT HYDROCELE  07-11-2012  . NM MYOVIEW LTD  05/18/2014   Low risk study. Normal perfusion: No ischemia or infarction. Mild LV dysfunction - 46% (does not correlate with echocardiographic EF of 50-55%)  . PROSTATE BIOPSY  05/15/12   Clinically both Lobes  . SPACE OAR INSTILLATION N/A 09/09/2017   Procedure: SPACE OAR INSTILLATION;  Surgeon: [name], MD;  Location: [facility];  Service: Urology;  Laterality: N/A;  . TEE WITHOUT CARDIOVERSION N/A 05/07/2014   Procedure: TRANSESOPHAGEAL ECHOCARDIOGRAM (TEE);  Surgeon: [name], MD;  Location: [facility] END[facility]COPY;  Service: Cardiovascular;  Laterality: N/A;   mild atherosclerosis plaque of aorta,  mild AR, MR, and TR,  no cardiac source of emboli  . TONSILLECTOMY  as child  . TRANSTHORACIC ECHOCARDIOGRAM  11/'18; 1/'19   a) In setting of sepsis: EF of 40-45%.  Diffuse hypokinesis.  No RWMA.  Biatrial enlargement.;; b) ** f/u Jan 2019: Normal LVF 55-60%** , mildly dilated aortic root(38 mm) and ascending aorta (44 mm). Compared to prior echo, LVEF has improved.  . TRANSTHORACIC ECHOCARDIOGRAM  7/'20; 9/'20   a) Hemorrhagic CVA/SAH - EF> 65%.  Moderate concentric LVH.-GR 1 DD mild RA dilation.; b) EF 60 to 65%.  Mild LVH.  GRII DD.  Normal RV size and function.  Mild LA dilation.  Normal RA size.  Unable to assess PA pressures.  No significant aortic sclerosis.  . TRANSURETHRAL RESECTION OF PROSTATE N/A 05/30/2017   Procedure: TRANSURETHRAL RESECTION OF THE PROSTATE (TURP);  Surgeon: [name], MD;  Location: [facility];  Service: Urology;  Laterality: N/A;     Current Outpatient Medications  Medication Sig Dispense Refill  . albuterol (VENTOLIN HFA) 108 (90 Base) MCG/ACT  inhaler Inhale 2 puffs into the lungs every 6 (six) hours as needed for wheezing or shortness of breath. 1 Inhaler 5  . amiodarone (PACERONE) 200 MG tablet Take 0.5 tablets (100 mg total) by mouth daily. 90 tablet 2  . apixaban (ELIQUIS) 5 MG TABS tablet Take 1 tablet (5 mg total) by mouth 2 (two) times daily. 60 tablet 0  . atorvastatin (LIPITOR) 10 MG tablet TAKE 1 TABLET BY MOUTH DAILY EACH EVENING 90 tablet 3  . azelastine (ASTELIN) 0.1 % nasal spray Place 1 spray into both nostrils 2 (two) times daily as needed for rhinitis or allergies. 30 mL 12  . Blood Glucose Monitoring Suppl (ACCU-CHEK AVIVA PLUS) w/Device KIT Use blood glucose machine as instructed on your strips 1 kit 1  . budesonide-formoterol (SYMBICORT) 160-4.5 MCG/ACT inhaler Inhale 2 puffs into the lungs 2 (two) times daily. 1 Inhaler 12  . carvedilol (COREG) 6.25  MG tablet Take 1 tablet (6.25 mg total) by mouth 2 (two) times daily. 180 tablet 3  . cephALEXin (KEFLEX) 500 MG capsule Take 1 capsule (500 mg total) by mouth 3 (three) times daily. 21 capsule 0  . colchicine 0.6 MG tablet Take 1 tablet (0.6 mg total) by mouth 2 (two) times daily as needed (gout flare). 30 tablet 0  . diclofenac sodium (VOLTAREN) 1 % GEL Apply 4 g topically 4 (four) times daily. (Patient taking differently: Apply 4 g topically 4 (four) times daily as needed (foot pain). ) 4 g 1  . divalproex (DEPAKOTE) 500 MG DR tablet Take 1 tablet (500 mg total) by mouth every 12 (twelve) hours. 180 tablet 1  . furosemide (LASIX) 40 MG tablet Take 1 tablet (40 mg total) by mouth 2 (two) times daily with breakfast and lunch. 180 tablet 1  . glimepiride (AMARYL) 2 MG tablet Take 1 tablet (2 mg total) by mouth daily with breakfast. 90 tablet 3  . glucose blood (ACCU-CHEK AVIVA PLUS) test strip Test 2 times daily dx e11.9 100 each 3  . HYDROcodone-acetaminophen (NORCO/VICODIN) 5-325 MG tablet Take 1 tablet by mouth every 6 (six) hours as needed for moderate pain. 20 tablet 0   . Lancets (ACCU-CHEK SOFT TOUCH) lancets Check blood sugars once per day. DX: E11.9 100 each 2  . metFORMIN (GLUCOPHAGE) 500 MG tablet TAKE 2 TABLETS (1,000 MG TOTAL) BY MOUTH 2 (TWO) TIMES DAILY WITH A MEAL. (Patient taking differently: 1 tablet BID) 360 tablet 0  . montelukast (SINGULAIR) 10 MG tablet Take 1 tablet (10 mg total) by mouth every evening. 30 tablet 0  . PRESCRIPTION MEDICATION Inhale into the lungs at bedtime. CPAP    . QUEtiapine (SEROQUEL) 25 MG tablet TAKE 1 TABLET BY MOUTH EVERYDAY AT BEDTIME 90 tablet 0   No current facility-administered medications for this visit.     Allergies:   Patient has no known allergies.   Social History:  The patient  reports that he has been smoking cigarettes. He has a 15.00 pack-year smoking history. He has never used smokeless tobacco. He reports current alcohol use of about 14.0 standard drinks of alcohol per week. He reports that he does not use drugs.   Family History:  The patient's family history includes Breast cancer in his mother; Ovarian cancer in his sister; Stroke in an other family member.    ROS:  Please see the history of present illness.   Otherwise, review of systems is positive for none.   All other systems are reviewed and negative.    PHYSICAL EXAM: VS:  There were no vitals taken for this visit. , BMI There is no height or weight on file to calculate BMI. GEN: Well nourished, well developed, in no acute distress  HEENT: normal  Neck: no JVD, carotid bruits, or masses Cardiac: RRR; no murmurs, rubs, or gallops,no edema  Respiratory:  clear to auscultation bilaterally, normal work of breathing GI: soft, nontender, nondistended, + BS MS: no deformity or atrophy  Skin: warm and dry Neuro:  Strength and sensation are intact Psych: euthymic mood, full affect  EKG:  EKG is ordered today. Personal review of the ekg ordered shows sinus rhythm, rate 72  Recent Labs: 04/22/2018: TSH 3.92 01/03/2019: B Natriuretic Peptide  159.4 01/09/2019: Hemoglobin 10.1; Magnesium 1.7; Platelets 338 01/13/2019: ALT 64; BUN 21; Creatinine, Ser 1.60; Potassium 3.9; Sodium 137    Lipid Panel     Component Value Date/Time   CHOL 173  10/21/2018 1828   TRIG 306 (H) 10/21/2018 1828   HDL 35 (L) 10/21/2018 1828   CHOLHDL 4.9 10/21/2018 1828   VLDL 61 (H) 10/21/2018 1828   LDLCALC 77 10/21/2018 1828   LDLDIRECT 69.0 04/22/2018 1210     Wt Readings from Last 3 Encounters:  01/22/19 212 lb (96.2 kg)  01/16/19 198 lb (89.8 kg)  01/14/19 214 lb (97.1 kg)      Other studies Reviewed: Additional studies/ records that were reviewed today include: TTE 01/04/19  Review of the above records today demonstrates:   1. Left ventricular ejection fraction, by visual estimation, is 60 to 65%. The left ventricle has normal function. Normal left ventricular size. There is mildly increased left ventricular hypertrophy.  2. Elevated mean left atrial pressure.  3. Left ventricular diastolic Doppler parameters are consistent with pseudonormalization pattern of LV diastolic filling.  4. Global right ventricle has normal systolic function.The right ventricular size is normal. No increase in right ventricular wall thickness.  5. Left atrial size was mildly dilated.  6. Right atrial size was normal.  7. The mitral valve is normal in structure. No evidence of mitral valve regurgitation. No evidence of mitral stenosis.  8. The tricuspid valve is normal in structure. Tricuspid valve regurgitation was not visualized by color flow Doppler.  9. The aortic valve is normal in structure. Aortic valve regurgitation was not visualized by color flow Doppler. Structurally normal aortic valve, with no evidence of sclerosis or stenosis. 10. The pulmonic valve was normal in structure. Pulmonic valve regurgitation is not visualized by color flow Doppler. 11. TR signal is inadequate for assessing pulmonary artery systolic pressure. 12. The inferior vena cava is  dilated in size with >50% respiratory variability, suggesting right atrial pressure of 8 mmHg.   ASSESSMENT AND PLAN:  1.  Paroxysmal atrial fibrillation: Currently on amiodarone and Eliquis.  Has unfortunately had an subarachnoid hemorrhage followed by subsequent PE when off of anticoagulation.  Now back on Eliquis.  His PFTs raise a concern as he has had a drop in his DLCO.  Being said, he has had a high-resolution CT scan and was told by his pulmonologist that he was not having issues with amiodarone.  I Adamariz Gillott discuss this with his pulmonologist.  He would prefer to stay on amiodarone if possible.  If he does develop issues, dofetilide would be his next option.  This patients CHA2DS2-VASc Score and unadjusted Ischemic Stroke Rate (% per year) is equal to 3.2 % stroke rate/year from a score of 3  Above score calculated as 1 point each if present [CHF, HTN, DM, Vascular=MI/PAD/Aortic Plaque, Age if 65-74, or Male] Above score calculated as 2 points each if present [Age > 75, or Stroke/TIA/TE]   2.  Hyperlipidemia: Goal LDL less than 70.  Plan per primary cardiology.  3.  Hypertension: Currently well controlled   Current medicines are reviewed at length with the patient today.   The patient does not have concerns regarding his medicines.  The following changes were made today:  none  Labs/ tests ordered today include:  No orders of the defined types were placed in this encounter.    Disposition:   FU with Hollis Tuller as needed  Signed, Ghadeer Kastelic Meredith Leeds, MD  02/03/2019 8:18 AM     [facility] [address] [place] [address] [phone] (office) [phone] (fax)

## 2019-02-04 DIAGNOSIS — G40909 Epilepsy, unspecified, not intractable, without status epilepticus: Secondary | ICD-10-CM | POA: Diagnosis not present

## 2019-02-04 DIAGNOSIS — N183 Chronic kidney disease, stage 3 unspecified: Secondary | ICD-10-CM | POA: Diagnosis not present

## 2019-02-04 DIAGNOSIS — E1122 Type 2 diabetes mellitus with diabetic chronic kidney disease: Secondary | ICD-10-CM | POA: Diagnosis not present

## 2019-02-04 DIAGNOSIS — I5032 Chronic diastolic (congestive) heart failure: Secondary | ICD-10-CM | POA: Diagnosis not present

## 2019-02-04 DIAGNOSIS — I69198 Other sequelae of nontraumatic intracerebral hemorrhage: Secondary | ICD-10-CM | POA: Diagnosis not present

## 2019-02-04 DIAGNOSIS — I130 Hypertensive heart and chronic kidney disease with heart failure and stage 1 through stage 4 chronic kidney disease, or unspecified chronic kidney disease: Secondary | ICD-10-CM | POA: Diagnosis not present

## 2019-02-07 ENCOUNTER — Other Ambulatory Visit: Payer: Self-pay | Admitting: Cardiology

## 2019-02-09 ENCOUNTER — Other Ambulatory Visit: Payer: Self-pay | Admitting: Family Medicine

## 2019-02-09 DIAGNOSIS — I130 Hypertensive heart and chronic kidney disease with heart failure and stage 1 through stage 4 chronic kidney disease, or unspecified chronic kidney disease: Secondary | ICD-10-CM | POA: Diagnosis not present

## 2019-02-09 DIAGNOSIS — E1122 Type 2 diabetes mellitus with diabetic chronic kidney disease: Secondary | ICD-10-CM | POA: Diagnosis not present

## 2019-02-09 DIAGNOSIS — I5032 Chronic diastolic (congestive) heart failure: Secondary | ICD-10-CM | POA: Diagnosis not present

## 2019-02-09 DIAGNOSIS — G40909 Epilepsy, unspecified, not intractable, without status epilepticus: Secondary | ICD-10-CM | POA: Diagnosis not present

## 2019-02-09 DIAGNOSIS — I69198 Other sequelae of nontraumatic intracerebral hemorrhage: Secondary | ICD-10-CM | POA: Diagnosis not present

## 2019-02-09 DIAGNOSIS — N183 Chronic kidney disease, stage 3 unspecified: Secondary | ICD-10-CM | POA: Diagnosis not present

## 2019-02-10 ENCOUNTER — Encounter: Payer: Self-pay | Admitting: Physical Medicine & Rehabilitation

## 2019-02-10 ENCOUNTER — Encounter: Payer: Medicare Other | Attending: Registered Nurse | Admitting: Physical Medicine & Rehabilitation

## 2019-02-10 ENCOUNTER — Other Ambulatory Visit: Payer: Self-pay

## 2019-02-10 VITALS — BP 146/76 | HR 74 | Temp 97.7°F | Ht 71.0 in | Wt 223.0 lb

## 2019-02-10 DIAGNOSIS — G4733 Obstructive sleep apnea (adult) (pediatric): Secondary | ICD-10-CM | POA: Insufficient documentation

## 2019-02-10 DIAGNOSIS — Z9079 Acquired absence of other genital organ(s): Secondary | ICD-10-CM | POA: Diagnosis not present

## 2019-02-10 DIAGNOSIS — Z8041 Family history of malignant neoplasm of ovary: Secondary | ICD-10-CM | POA: Diagnosis not present

## 2019-02-10 DIAGNOSIS — M109 Gout, unspecified: Secondary | ICD-10-CM | POA: Insufficient documentation

## 2019-02-10 DIAGNOSIS — Z7901 Long term (current) use of anticoagulants: Secondary | ICD-10-CM | POA: Diagnosis not present

## 2019-02-10 DIAGNOSIS — E1122 Type 2 diabetes mellitus with diabetic chronic kidney disease: Secondary | ICD-10-CM | POA: Diagnosis not present

## 2019-02-10 DIAGNOSIS — I429 Cardiomyopathy, unspecified: Secondary | ICD-10-CM | POA: Insufficient documentation

## 2019-02-10 DIAGNOSIS — Z8546 Personal history of malignant neoplasm of prostate: Secondary | ICD-10-CM | POA: Insufficient documentation

## 2019-02-10 DIAGNOSIS — F17210 Nicotine dependence, cigarettes, uncomplicated: Secondary | ICD-10-CM | POA: Diagnosis not present

## 2019-02-10 DIAGNOSIS — G9340 Encephalopathy, unspecified: Secondary | ICD-10-CM | POA: Diagnosis not present

## 2019-02-10 DIAGNOSIS — J439 Emphysema, unspecified: Secondary | ICD-10-CM | POA: Insufficient documentation

## 2019-02-10 DIAGNOSIS — R569 Unspecified convulsions: Secondary | ICD-10-CM | POA: Diagnosis not present

## 2019-02-10 DIAGNOSIS — I69398 Other sequelae of cerebral infarction: Secondary | ICD-10-CM

## 2019-02-10 DIAGNOSIS — I5042 Chronic combined systolic (congestive) and diastolic (congestive) heart failure: Secondary | ICD-10-CM | POA: Diagnosis not present

## 2019-02-10 DIAGNOSIS — Z823 Family history of stroke: Secondary | ICD-10-CM | POA: Diagnosis not present

## 2019-02-10 DIAGNOSIS — I4891 Unspecified atrial fibrillation: Secondary | ICD-10-CM | POA: Insufficient documentation

## 2019-02-10 DIAGNOSIS — R269 Unspecified abnormalities of gait and mobility: Secondary | ICD-10-CM

## 2019-02-10 DIAGNOSIS — E7849 Other hyperlipidemia: Secondary | ICD-10-CM | POA: Diagnosis not present

## 2019-02-10 DIAGNOSIS — N183 Chronic kidney disease, stage 3 unspecified: Secondary | ICD-10-CM | POA: Diagnosis not present

## 2019-02-10 DIAGNOSIS — I130 Hypertensive heart and chronic kidney disease with heart failure and stage 1 through stage 4 chronic kidney disease, or unspecified chronic kidney disease: Secondary | ICD-10-CM | POA: Diagnosis not present

## 2019-02-10 DIAGNOSIS — I480 Paroxysmal atrial fibrillation: Secondary | ICD-10-CM | POA: Diagnosis present

## 2019-02-10 DIAGNOSIS — Z803 Family history of malignant neoplasm of breast: Secondary | ICD-10-CM | POA: Diagnosis not present

## 2019-02-10 NOTE — Progress Notes (Signed)
Subjective:    Patient ID: Ronald [name], male    DOB: 11-18-1945, 73 y.o.   MRN: [id number] 73 y.o. right-handed male with history significant for right frontal ICH related to Eliquis and admitted 10/21/2018 until 10/27/2018 and discharged to home ambulating 240 feet without assistive device, atrial fibrillation followed by Dr. Dorris Carnes and no longer on anticoagulation, hypertension, chronic diastolic congestive heart failure, COPD followed by Dr. Halford Chessman, chronic kidney disease stage III with creatinine baseline 1.62, diabetes mellitus and seizure disorder maintained on Keppra.  Per chart review lives with spouse 2 level home with bed and bath on main level.  Reportedly independent prior to admission.  Presented 11/22/2018 with increased altered mental status poor balance leaning to the right side.  Urinalysis negative COVID negative.  Cranial CT scan showed no acute or new intracranial hemorrhage.  Decreasing conspiculity of the right frontal hemorrhage and slight decrease in associated edema.  CT angiogram of head and neck with no emergent large vessel occlusion.  MRI of the brain showed no acute findings or acute abnormality.  Prior subacute hematoma measuring 4.2 x 2 cm no significant adjacent edema or mass-effect.  EEG negative for seizure.  Most recent echocardiogram after initial [place] 10/22/2018 with ejection fraction of 60% hyperdynamic systolic function.  Neurology follow-up Keppra was changed to Depakote twice daily.  Bouts of confusion a continue to improve.  Patient was admitted for a comprehensive rehab program.  Admit date: 11/27/2018 Discharge date: 12/13/2018  HPI  Dressing and bathing Mod I HHPT and nursing, balance walking, issues compounded by gout in both feet Pt feels like he has mild balance problem when turning >90% recovery Some recurrence of gout  Hospitalized for PE after off Eliquis for ICH, now back on Eliquis   Pain Inventory Average Pain 0 Pain Right Now 0 My pain is  no pain  In the last 24 hours, has pain interfered with the following? General activity 0 Relation with others 0 Enjoyment of life 0 What TIME of day is your pain at its worst? no pain Sleep (in general) Good  Pain is worse with: no pain Pain improves with: no pain Relief from Meds: no pain  Mobility walk without assistance ability to climb steps?  yes do you drive?  no  Function retired  Neuro/Psych tremor  Prior Studies Any changes since last visit?  no  Physicians involved in your care Any changes since last visit?  no   Family History  Problem Relation Age of Onset  . Breast cancer Mother   . Stroke Mother   . Stroke Other        Unknown   . Ovarian cancer Sister   . Colon cancer Neg Hx   . Esophageal cancer Neg Hx   . Rectal cancer Neg Hx   . Stomach cancer Neg Hx    Social History   Socioeconomic History  . Marital status: Married    Spouse name: Not on file  . Number of children: Not on file  . Years of education: Not on file  . Highest education level: Bachelor's degree (e.g., BA, AB, BS)  Occupational History  . Occupation: Scientist, clinical (histocompatibility and immunogenetics): [employer]  Social Needs  . Financial resource strain: Not on file  . Food insecurity    Worry: Not on file    Inability: Not on file  . Transportation needs    Medical: Not on file    Non-medical: Not on file  Tobacco Use  .  Smoking status: Current Every Day Smoker    Packs/day: 0.25    Years: 60.00    Pack years: 15.00    Types: Cigarettes    Last attempt to quit: 04/11/2018    Years since quitting: 0.8  . Smokeless tobacco: Never Used  . Tobacco comment: 09-05-2017 tried quitting smoking 11/ 2018 but started back at 0.75ppd from 1ppd  Substance and Sexual Activity  . Alcohol use: Not Currently  . Drug use: No  . Sexual activity: Not on file  Lifestyle  . Physical activity    Days per week: Not on file    Minutes per session: Not on file  . Stress: Not on file  Relationships  . Social  Herbalist on phone: Not on file    Gets together: Not on file    Attends religious service: Not on file    Active member of club or organization: Not on file    Attends meetings of clubs or organizations: Not on file    Relationship status: Not on file  Other Topics Concern  . Not on file  Social History Narrative   Married 25 years   2 children but not with this wife   Left handed    Past Surgical History:  Procedure Laterality Date  . CARDIOVERSION N/A 05/07/2014   Procedure: CARDIOVERSION;  Surgeon: [name], MD;  Location[facility] END[facility]COPY;  Service: Cardiovascular;  Laterality: N/A;  . CARDIOVERSION N/A 09/09/2014   Procedure: CARDIOVERSION;  Surgeon: Lelon Perla, MD;  Location: [facility] ENDOSCOPY;  Service: Cardiovascular;  Laterality: N/A;  successfully  . CATARACT EXTRACTION W/ INTRAOCULAR LENS  IMPLANT, BILATERAL  08 and 09/  2018  . COLONOSCOPY  last one 06-07-2011  . GOLD SEED IMPLANT N/A 09/09/2017   Procedure: GOLD SEED IMPLANT;  Surgeon: [name], MD;  [facility];  Service: Urology;  Laterality: N/A;  . HYDROCELE EXCISION Bilateral 09/26/2015   Procedure: HYDROCELECTOMY ADULT;  Surgeon: [name], MD;  Location: [facility];  Service: Urology;  Laterality: Bilateral;  . LAMINECTOMY AND MICRODISCECTOMY LUMBAR SPINE  12-23-2003   Left L5 -- S1  . LAPAROSCOPIC BILATERAL INGUINAL HERNIA REPAIR/  UMBILICAL HERNIA REPAIR WITH MESH/  ASPIRATION LEFT HYDROCELE  07-11-2012  . NM MYOVIEW LTD  05/18/2014   Low risk study. Normal perfusion: No ischemia or infarction. Mild LV dysfunction - 46% (does not correlate with echocardiographic EF of 50-55%)  . PROSTATE BIOPSY  05/15/12   Clinically both Lobes  . SPACE OAR INSTILLATION N/A 09/09/2017   Procedure: SPACE OAR INSTILLATION;  Surgeon: [name], MD;  Location: [facility];  Service: Urology;  Laterality: N/A;  . TEE WITHOUT CARDIOVERSION N/A  05/07/2014   Procedure: TRANSESOPHAGEAL ECHOCARDIOGRAM (TEE);  Surgeon: [name], MD;  Location: [facility] ENDOSCOPY;  Service: Cardiovascular;  Laterality: N/A;   mild atherosclerosis plaque of aorta,  mild AR, MR, and TR,  no cardiac source of emboli  . TONSILLECTOMY  as child  . TRANSTHORACIC ECHOCARDIOGRAM  11/'18; 1/'19   a) In setting of sepsis: EF of 40-45%.  Diffuse hypokinesis.  No RWMA.  Biatrial enlargement.;; b) ** f/u Jan 2019: Normal LVF 55-60%** , mildly dilated aortic root(38 mm) and ascending aorta (44 mm). Compared to prior echo, LVEF has improved.  . TRANSTHORACIC ECHOCARDIOGRAM  7/'20; 9/'20   a) Hemorrhagic CVA/SAH - EF> 65%.  Moderate concentric LVH.-GR 1 DD mild RA dilation.; b) EF 60  to 65%.  Mild LVH.  GRII DD.  Normal RV size and function.  Mild LA dilation.  Normal RA size.  Unable to assess PA pressures.  No significant aortic sclerosis.  . TRANSURETHRAL RESECTION OF PROSTATE N/A 05/30/2017   Procedure: TRANSURETHRAL RESECTION OF THE PROSTATE (TURP);  Surgeon: [name], MD;  Location: [facility];  Service: Urology;  Laterality: N/A;   Past Medical History:  Diagnosis Date  . Allergic rhinitis   . Anticoagulant long-term use    eliquis  . Atrial fibrillation ([place])   . Cardiomyopathy due to systemic disease [facility])    followed by dr harding  . COPD with emphysema [facility])    pulmologist-  dr Halford Chessman  . Dyspnea    occasional per pt  . Heart disease   . History of colon polyps    tubular adenoma 2013  . History of gout    09-05-2017 last flare-up  05/ 2019 3 wks ago, feet  . History of sepsis 02/18/2017   per d/c note probable uti, acute chf, acute renal failure, hypoxia  . Hyperlipidemia   . Hyperplasia of prostate with lower urinary tract symptoms (LUTS)   . Hypertension   . OSA on CPAP    per study 08-03-2004  Severe OSA  . Persistent atrial fibrillation [facility])    cardiologist --  dr Dorris Carnes--  post cardioversion 05-07-2014  . Prostate cancer [facility])  urologsit-  dr Alyson Ingles--- as of 05-21-2017 per pt last PSA 11 approx.   Dx  2014--  stage T1c, Gleason 3+3=6, PSA 6.67--  Active surveillance/  04/ 2019  Stage T1b, Gleason 3+4, PSA 12.8- plan external radiation therapy    . 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 ([place])    pulmologist-  dr Halford Chessman  . Seasonal allergies   . Sigmoid diverticulosis   . Systolic and diastolic CHF, chronic [facility])    cardiologist-  [name]  . Tinea versicolor   . Type 2 diabetes mellitus ([place])   . Wears hearing aid in both ears    BP (!) 146/76   Pulse 74   Temp 97.7 F (36.5 C)   Ht 5\' 11"  (1.803 m)   Wt 223 lb (101.2 kg)   SpO2 93%   BMI 31.10 kg/m   Opioid Risk Score:   Fall Risk Score:  `1  Depression screen PHQ 2/9  Depression screen [facility] 2/9 12/24/2018 06/20/2017 12/05/2016 07/04/2015  Decreased Interest 0 0 0 0  Down, Depressed, Hopeless 0 0 0 0  PHQ - 2 Score 0 0 0 0  Some recent data might be hidden    Review of Systems  Constitutional: Negative.   HENT: Negative.   Eyes: Negative.   Respiratory: Negative.   Cardiovascular: Negative.   Gastrointestinal: Negative.   Endocrine: Negative.   Genitourinary: Negative.   Musculoskeletal: Negative.   Skin: Negative.   Allergic/Immunologic: Negative.   Neurological: Positive for tremors.  Hematological: Negative.   Psychiatric/Behavioral: Negative.   All other systems reviewed and are negative.      Objective:   Physical Exam Vitals signs and nursing note reviewed.  Constitutional:      Appearance: He is obese.  HENT:     Head: Normocephalic and atraumatic.  Eyes:     Extraocular Movements: Extraocular movements intact.     Conjunctiva/sclera: Conjunctivae normal.     Pupils: Pupils are equal, round, and reactive to light.  Musculoskeletal: Normal range of motion.     Comments: Mild tenderness palpation  dorsum of the right foot lateral aspect.  No pain with foot range of motion.  He is able ambulate without  antalgia.  Skin:    General: Skin is warm and dry.  Neurological:     General: No focal deficit present.     Mental Status: He is alert and oriented to person, place, and time.     Coordination: Romberg sign negative. Finger-Nose-Finger Test normal. Impaired rapid alternating movements.     Gait: Tandem walk abnormal.     Comments: Motor strength is 5/5 bilateral deltoid bicep tricep grip hip flexor knee extensor ankle dorsiflexor Patient equal to light touch bilateral upper and lower limb  Psychiatric:        Mood and Affect: Mood normal.        Behavior: Behavior normal.           Assessment & Plan:   #1.  Right frontal infarct has mild residual coordination and balance deficit.  He is very close to his baseline.  I think he has made an excellent recovery.  They are quite pleased about the rehabilitation facility at [facility] as well as his home health therapist at [facility] I do not think he needs to proceed to outpatient therapy No physical medicine rehab follow-up needed F/u Neuro for seizure prophylaxis F/U PCP

## 2019-02-10 NOTE — Patient Instructions (Signed)

## 2019-02-16 ENCOUNTER — Encounter: Payer: Self-pay | Admitting: Family Medicine

## 2019-02-16 ENCOUNTER — Other Ambulatory Visit: Payer: Self-pay

## 2019-02-16 ENCOUNTER — Ambulatory Visit (INDEPENDENT_AMBULATORY_CARE_PROVIDER_SITE_OTHER): Payer: Medicare Other | Admitting: Family Medicine

## 2019-02-16 VITALS — BP 124/76 | HR 69 | Temp 97.8°F | Resp 18 | Ht 71.0 in | Wt 218.0 lb

## 2019-02-16 DIAGNOSIS — I10 Essential (primary) hypertension: Secondary | ICD-10-CM | POA: Diagnosis not present

## 2019-02-16 DIAGNOSIS — E1165 Type 2 diabetes mellitus with hyperglycemia: Secondary | ICD-10-CM | POA: Diagnosis not present

## 2019-02-16 DIAGNOSIS — E8809 Other disorders of plasma-protein metabolism, not elsewhere classified: Secondary | ICD-10-CM | POA: Diagnosis not present

## 2019-02-16 DIAGNOSIS — M1009 Idiopathic gout, multiple sites: Secondary | ICD-10-CM

## 2019-02-16 LAB — HEPATIC FUNCTION PANEL
ALT: 14 U/L (ref 0–53)
AST: 14 U/L (ref 0–37)
Albumin: 4 g/dL (ref 3.5–5.2)
Alkaline Phosphatase: 79 U/L (ref 39–117)
Bilirubin, Direct: 0.1 mg/dL (ref 0.0–0.3)
Total Bilirubin: 0.4 mg/dL (ref 0.2–1.2)
Total Protein: 6 g/dL (ref 6.0–8.3)

## 2019-02-16 LAB — BASIC METABOLIC PANEL
BUN: 21 mg/dL (ref 6–23)
CO2: 31 mEq/L (ref 19–32)
Calcium: 9.2 mg/dL (ref 8.4–10.5)
Chloride: 102 mEq/L (ref 96–112)
Creatinine, Ser: 1.26 mg/dL (ref 0.40–1.50)
GFR: 55.98 mL/min — ABNORMAL LOW (ref >60.00)
Glucose, Bld: 84 mg/dL (ref 70–99)
Potassium: 3.9 mEq/L (ref 3.5–5.1)
Sodium: 142 mEq/L (ref 135–145)

## 2019-02-16 LAB — HEMOGLOBIN A1C: Hgb A1c MFr Bld: 6 % (ref 4.6–6.5)

## 2019-02-16 MED ORDER — COLCHICINE 0.6 MG PO TABS: 0.6000 mg | ORAL_TABLET | Freq: Two times a day (BID) | ORAL | 3 refills | Status: DC | PRN

## 2019-02-16 NOTE — Progress Notes (Signed)
Subjective:     Patient ID: Ronald [name], male   DOB: [birth year], 73 y.o.   MRN: [id number]  HPI  Ronald [name] is here today for medical follow-up.  He saw neurology recently and had some urinary symptoms.  He ended up growing out Klebsiella organism and was treated with Keflex and symptomatically improved at this time.  Still has occasional slow stream- but no burning.Marland Kitchen  He had some blood work done and creatinine had jumped up to 1.6.  He feels he is staying adequately hydrated.  His blood pressure seems to have stabilized.  He was recently taken off amlodipine and reduction in other medications.  Blood pressures have been very stable by home readings.  No dizziness.  No recent falls.  He has been released from rehab medicine  Has had a couple recent gout flareups involving the feet.  He takes colchicine as needed.  He knows to avoid nonsteroidals.  Not a good candidate for prednisone because of his hyperglycemia.  He has seen some improvement in his edema recently as his albumin is improving and was also taken off amlodipine.  He has history of diastolic heart failure.  No significant dyspnea over baseline  Past Medical History:  Diagnosis Date  . Allergic rhinitis   . Anticoagulant long-term use    eliquis  . Atrial fibrillation ([place])   . Cardiomyopathy due to systemic disease [facility])    followed by dr harding  . COPD with emphysema [facility])    pulmologist-  dr Halford Chessman  . Dyspnea    occasional per pt  . Heart disease   . History of colon polyps    tubular adenoma 2013  . History of gout    09-05-2017 last flare-up  05/ 2019 3 wks ago, feet  . History of sepsis 02/18/2017   per d/c note probable uti, acute chf, acute renal failure, hypoxia  . Hyperlipidemia   . Hyperplasia of prostate with lower urinary tract symptoms (LUTS)   . Hypertension   . OSA on CPAP    per study 08-03-2004  Severe OSA  . Persistent atrial fibrillation [facility])    cardiologist --  dr Dorris Carnes--  post cardioversion  05-07-2014  . Prostate cancer [facility]) urologsit-  dr Alyson Ingles--- as of 05-21-2017 per pt last PSA 11 approx.   Dx  2014--  stage T1c, Gleason 3+3=6, PSA 6.67--  Active surveillance/  04/ 2019  Stage T1b, Gleason 3+4, PSA 12.8- plan external radiation therapy    . 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 Seasonal allergies   . Sigmoid diverticulosis   . Systolic and diastolic CHF, chronic [facility])    cardiologist-  [name]  . Tinea versicolor   . Type 2 diabetes mellitus ([place])   . Wears hearing aid in both ears    Past Surgical History:  Procedure Laterality Date  . CARDIOVERSION N/A 05/07/2014   Procedure: CARDIOVERSION;  Surgeon: [name], MD;  Location: [facility] ENDOSCOPY;  Service: Cardiovascular;  Laterality: N/A;  . CARDIOVERSION N/A 09/09/2014   Procedure: CARDIOVERSION;  Surgeon: Lelon Perla, MD;  Location: [facility] ENDOSCOPY;  Service: Cardiovascular;  Laterality: N/A;  successfully  . CATARACT EXTRACTION W/ INTRAOCULAR LENS  IMPLANT, BILATERAL  08 and 09/  2018  . COLONOSCOPY  last one 06-07-2011  . GOLD SEED IMPLANT N/A 09/09/2017   Procedure: GOLD SEED IMPLANT;  Surgeon: [name], MD;  Location: [facility];  Service:  Urology;  Laterality: N/A;  . HYDROCELE EXCISION Bilateral 09/26/2015   Procedure: HYDROCELECTOMY ADULT;  Surgeon: [name], MD;  Location: [facility];  Service: Urology;  Laterality: Bilateral;  . LAMINECTOMY AND MICRODISCECTOMY LUMBAR SPINE  12-23-2003   Left L5 -- S1  . LAPAROSCOPIC BILATERAL INGUINAL HERNIA REPAIR/  UMBILICAL HERNIA REPAIR WITH MESH/  ASPIRATION LEFT HYDROCELE  07-11-2012  . NM MYOVIEW LTD  05/18/2014   Low risk study. Normal perfusion: No ischemia or infarction. Mild LV dysfunction - 46% (does not correlate with echocardiographic EF of 50-55%)  . PROSTATE BIOPSY  05/15/12   Clinically both Lobes  . SPACE OAR INSTILLATION N/A 09/09/2017    Procedure: SPACE OAR INSTILLATION;  Surgeon: [name], MD;  Location: [facility];  Service: Urology;  Laterality: N/A;  . TEE WITHOUT CARDIOVERSION N/A 05/07/2014   Procedure: TRANSESOPHAGEAL ECHOCARDIOGRAM (TEE);  Surgeon: [name], MD;  Location: [facility] ENDOSCOPY;  Service: Cardiovascular;  Laterality: N/A;   mild atherosclerosis plaque of aorta,  mild AR, MR, and TR,  no cardiac source of emboli  . TONSILLECTOMY  as child  . TRANSTHORACIC ECHOCARDIOGRAM  11/'18; 1/'19   a) In setting of sepsis: EF of 40-45%.  Diffuse hypokinesis.  No RWMA.  Biatrial enlargement.;; b) ** f/u Jan 2019: Normal LVF 55-60%** , mildly dilated aortic root(38 mm) and ascending aorta (44 mm). Compared to prior echo, LVEF has improved.  . TRANSTHORACIC ECHOCARDIOGRAM  7/'20; 9/'20   a) Hemorrhagic CVA/SAH - EF> 65%.  Moderate concentric LVH.-GR 1 DD mild RA dilation.; b) EF 60 to 65%.  Mild LVH.  GRII DD.  Normal RV size and function.  Mild LA dilation.  Normal RA size.  Unable to assess PA pressures.  No significant aortic sclerosis.  . TRANSURETHRAL RESECTION OF PROSTATE N/A 05/30/2017   Procedure: TRANSURETHRAL RESECTION OF THE PROSTATE (TURP);  Surgeon: [name], MD;  Location: [facility];  Service: Urology;  Laterality: N/A;    reports that he has been smoking cigarettes. He has a 15.00 pack-year smoking history. He has never used smokeless tobacco. He reports previous alcohol use. He reports that he does not use drugs. family history includes Breast cancer in his mother; Ovarian cancer in his sister; Stroke in his mother and another family member. No Known Allergies'  Review of Systems  Constitutional: Negative for fatigue.  Eyes: Negative for visual disturbance.  Respiratory: Negative for cough, chest tightness and shortness of breath.   Cardiovascular: Negative for chest pain, palpitations and leg swelling.  Neurological: Negative for dizziness, syncope, weakness, light-headedness  and headaches.       Objective:   Physical Exam Constitutional:      Appearance: He is well-developed.  HENT:     Right Ear: External ear normal.     Left Ear: External ear normal.  Eyes:     Pupils: Pupils are equal, round, and reactive to light.  Neck:     Musculoskeletal: Neck supple.     Thyroid: No thyromegaly.  Cardiovascular:     Rate and Rhythm: Normal rate and regular rhythm.  Pulmonary:     Effort: Pulmonary effort is normal. No respiratory distress.     Breath sounds: Normal breath sounds. No wheezing or rales.  Neurological:     Mental Status: He is alert and oriented to person, place, and time.        Assessment:     #1 type 2 diabetes.  Well controlled by home  readings.  Due for repeat A1c  #2 history of chronic kidney disease with recent acute kidney injury  #3 bilateral leg edema much improved-likely related to improving albumin and discontinuation of amlodipine  #4 history of gout with recent presumed flareup    Plan:     -Recheck basic metabolic panel, hepatic panel, and A1c -Refill colchicine for as needed use for gout -We will plan routine follow-up in 3 months and sooner as needed  [name] MD [facility]

## 2019-03-10 DIAGNOSIS — C61 Malignant neoplasm of prostate: Secondary | ICD-10-CM | POA: Diagnosis not present

## 2019-03-17 DIAGNOSIS — C61 Malignant neoplasm of prostate: Secondary | ICD-10-CM | POA: Diagnosis not present

## 2019-03-17 DIAGNOSIS — N401 Enlarged prostate with lower urinary tract symptoms: Secondary | ICD-10-CM | POA: Diagnosis not present

## 2019-03-17 DIAGNOSIS — R351 Nocturia: Secondary | ICD-10-CM | POA: Diagnosis not present

## 2019-03-17 DIAGNOSIS — N3000 Acute cystitis without hematuria: Secondary | ICD-10-CM | POA: Diagnosis not present

## 2019-03-18 DIAGNOSIS — E119 Type 2 diabetes mellitus without complications: Secondary | ICD-10-CM | POA: Diagnosis not present

## 2019-03-18 DIAGNOSIS — H524 Presbyopia: Secondary | ICD-10-CM | POA: Diagnosis not present

## 2019-03-18 DIAGNOSIS — H26493 Other secondary cataract, bilateral: Secondary | ICD-10-CM | POA: Diagnosis not present

## 2019-03-18 DIAGNOSIS — Z961 Presence of intraocular lens: Secondary | ICD-10-CM | POA: Diagnosis not present

## 2019-03-18 LAB — HM DIABETES EYE EXAM

## 2019-03-31 ENCOUNTER — Ambulatory Visit: Payer: Medicare Other | Admitting: Adult Health

## 2019-04-07 ENCOUNTER — Ambulatory Visit: Payer: Medicare Other | Admitting: Family Medicine

## 2019-04-10 DIAGNOSIS — I639 Cerebral infarction, unspecified: Secondary | ICD-10-CM

## 2019-04-10 HISTORY — DX: Cerebral infarction, unspecified: I63.9

## 2019-04-14 ENCOUNTER — Other Ambulatory Visit: Payer: Self-pay | Admitting: Family Medicine

## 2019-04-19 ENCOUNTER — Other Ambulatory Visit: Payer: Self-pay | Admitting: Family Medicine

## 2019-04-20 NOTE — Telephone Encounter (Signed)
This is not on med list for patient. Is he still supposed to be taking this?

## 2019-04-20 NOTE — Telephone Encounter (Signed)
Yes.  As long as he is taking the Lasix should be on this.

## 2019-04-23 DIAGNOSIS — R3915 Urgency of urination: Secondary | ICD-10-CM | POA: Diagnosis not present

## 2019-04-23 DIAGNOSIS — N401 Enlarged prostate with lower urinary tract symptoms: Secondary | ICD-10-CM | POA: Diagnosis not present

## 2019-05-11 ENCOUNTER — Other Ambulatory Visit: Payer: Self-pay | Admitting: Family Medicine

## 2019-05-11 NOTE — Telephone Encounter (Signed)
Last prescribed by Cherylann Ratel, DO OK to refill?

## 2019-05-11 NOTE — Telephone Encounter (Signed)
Refill for 6 months. 

## 2019-05-12 NOTE — Telephone Encounter (Signed)
Last filled with take 1 tablet BID, the instructions on this request is asking for 2 tablets BID.  Please confirm correct dosage.

## 2019-05-12 NOTE — Telephone Encounter (Signed)
Dose of Eliquis should be 5 mg twice daily.  The only time we dose 10 mg twice daily for 7 days is for an acute DVT or PE but then transition to 5 mg twice daily

## 2019-05-19 ENCOUNTER — Other Ambulatory Visit: Payer: Self-pay

## 2019-05-19 ENCOUNTER — Encounter: Payer: Self-pay | Admitting: Family Medicine

## 2019-05-19 ENCOUNTER — Ambulatory Visit (INDEPENDENT_AMBULATORY_CARE_PROVIDER_SITE_OTHER): Payer: Medicare Other | Admitting: Family Medicine

## 2019-05-19 VITALS — BP 128/78 | HR 68 | Temp 97.2°F | Ht 71.0 in | Wt 220.6 lb

## 2019-05-19 DIAGNOSIS — I480 Paroxysmal atrial fibrillation: Secondary | ICD-10-CM | POA: Diagnosis not present

## 2019-05-19 DIAGNOSIS — I10 Essential (primary) hypertension: Secondary | ICD-10-CM | POA: Diagnosis not present

## 2019-05-19 DIAGNOSIS — E1165 Type 2 diabetes mellitus with hyperglycemia: Secondary | ICD-10-CM | POA: Diagnosis not present

## 2019-05-19 DIAGNOSIS — Z79899 Other long term (current) drug therapy: Secondary | ICD-10-CM | POA: Diagnosis not present

## 2019-05-19 LAB — COMPREHENSIVE METABOLIC PANEL
ALT: 10 U/L (ref 0–53)
AST: 10 U/L (ref 0–37)
Albumin: 3.8 g/dL (ref 3.5–5.2)
Alkaline Phosphatase: 64 U/L (ref 39–117)
BUN: 18 mg/dL (ref 6–23)
CO2: 32 mEq/L (ref 19–32)
Calcium: 8.9 mg/dL (ref 8.4–10.5)
Chloride: 103 mEq/L (ref 96–112)
Creatinine, Ser: 1.35 mg/dL (ref 0.40–1.50)
GFR: 51.66 mL/min — ABNORMAL LOW (ref >60.00)
Glucose, Bld: 102 mg/dL — ABNORMAL HIGH (ref 70–99)
Potassium: 3.9 mEq/L (ref 3.5–5.1)
Sodium: 142 mEq/L (ref 135–145)
Total Bilirubin: 0.5 mg/dL (ref 0.2–1.2)
Total Protein: 5.7 g/dL — ABNORMAL LOW (ref 6.0–8.3)

## 2019-05-19 LAB — POCT GLYCOSYLATED HEMOGLOBIN (HGB A1C): Hemoglobin A1C: 5.9 % — AB (ref 4.0–5.6)

## 2019-05-19 LAB — TSH: TSH: 2.92 u[IU]/mL (ref 0.35–4.50)

## 2019-05-19 LAB — MAGNESIUM: Magnesium: 1.9 mg/dL (ref 1.5–2.5)

## 2019-05-19 NOTE — Progress Notes (Signed)
Subjective:     Patient ID: Ronald [name], male   DOB: 1945-12-10, 74 y.o.   MRN: [id number]  HPI Ronald [name] is seen for medical follow-up.  Ronald [name] states Ronald [name] feels better than Ronald [name] has in some time.  Ronald [name] had battled some edema recently and was taken off amlodipine.  Ronald [name] also had low albumin which was likely improving with improved dietary intake.  Ronald [name] remains on furosemide 40 mg twice daily.  No orthopnea.  Weight stable.  Type 2 diabetes.  History of good control.  Ronald [name] remains on Metformin and low-dose glimepiride 2 mg daily.  Blood sugars have been very well controlled at home.  No hypoglycemia.  Ronald [name] does have some mild chronic kidney disease.  No nonsteroidal use.  Ronald [name] has history of atrial fibrillation and also history of pulmonary embolus.  Ronald [name] remains on Eliquis.  Denies any recent falls.  Past Medical History:  Diagnosis Date  . Allergic rhinitis   . Anticoagulant long-term use    eliquis  . Atrial fibrillation ([place])   . Cardiomyopathy due to systemic disease [facility])    followed by dr harding  . COPD with emphysema [facility])    pulmologist-  dr Halford Chessman  . Dyspnea    occasional per pt  . Heart disease   . History of colon polyps    tubular adenoma 2013  . History of gout    09-05-2017 last flare-up  05/ 2019 3 wks ago, feet  . History of sepsis 02/18/2017   per d/c note probable uti, acute chf, acute renal failure, hypoxia  . History of YAG laser capsulotomy of lens December 2020, January 2021  . Hyperlipidemia   . Hyperplasia of prostate with lower urinary tract symptoms (LUTS)   . Hypertension   . OSA on CPAP    per study 08-03-2004  Severe OSA  . Persistent atrial fibrillation [facility])    cardiologist --  dr Dorris Carnes--  post cardioversion 05-07-2014  . Prostate cancer [facility]) urologsit-  dr Alyson Ingles--- as of 05-21-2017 per pt last PSA 11 approx.   Dx  2014--  stage T1c, Gleason 3+3=6, PSA 6.67--  Active surveillance/  04/ 2019  Stage T1b, Gleason 3+4, PSA 12.8- plan external radiation  therapy    . 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 Seasonal allergies   . Sigmoid diverticulosis   . Systolic and diastolic CHF, chronic [facility])    cardiologist-  [name]  . Tinea versicolor   . Type 2 diabetes mellitus ([place])   . Wears hearing aid in both ears    Past Surgical History:  Procedure Laterality Date  . CARDIOVERSION N/A 05/07/2014   Procedure: CARDIOVERSION;  Surgeon: [name], MD;  Location: [facility] ENDOSCOPY;  Service: Cardiovascular;  Laterality: N/A;  . CARDIOVERSION N/A 09/09/2014   Procedure: CARDIOVERSION;  Surgeon: Lelon Perla, MD;  Location: [facility] ENDOSCOPY;  Service: Cardiovascular;  Laterality: N/A;  successfully  . CATARACT EXTRACTION W/ INTRAOCULAR LENS  IMPLANT, BILATERAL  08 and 09/  2018  . COLONOSCOPY  last one 06-07-2011  . GOLD SEED IMPLANT N/A 09/09/2017   Procedure: GOLD SEED IMPLANT;  Surgeon: [name], MD;  Location: [facility];  Service: Urology;  Laterality: N/A;  . HYDROCELE EXCISION Bilateral 09/26/2015   Procedure: HYDROCELECTOMY ADULT;  Surgeon: [name], MD;  Location[facility];  Service: Urology;  Laterality: Bilateral;  . LAMINECTOMY AND MICRODISCECTOMY LUMBAR SPINE  12-23-2003   Left L5 -- S1  . LAPAROSCOPIC BILATERAL INGUINAL HERNIA REPAIR/  UMBILICAL HERNIA REPAIR WITH MESH/  ASPIRATION LEFT HYDROCELE  07-11-2012  . NM MYOVIEW LTD  05/18/2014   Low risk study. Normal perfusion: No ischemia or infarction. Mild LV dysfunction - 46% (does not correlate with echocardiographic EF of 50-55%)  . PROSTATE BIOPSY  05/15/12   Clinically both Lobes  . SPACE OAR INSTILLATION N/A 09/09/2017   Procedure: SPACE OAR INSTILLATION;  Surgeon: [name], MD;  Location: [facility];  Service: Urology;  Laterality: N/A;  . TEE WITHOUT CARDIOVERSION N/A 05/07/2014   Procedure: TRANSESOPHAGEAL ECHOCARDIOGRAM (TEE);  Surgeon: [name], MD;  Location: [facility] ENDOSCOPY;  Service: Cardiovascular;  Laterality: N/A;   mild atherosclerosis plaque of aorta,  mild AR, MR, and TR,  no cardiac source of emboli  . TONSILLECTOMY  as child  . TRANSTHORACIC ECHOCARDIOGRAM  11/'18; 1/'19   a) In setting of sepsis: EF of 40-45%.  Diffuse hypokinesis.  No RWMA.  Biatrial enlargement.;; b) ** f/u Jan 2019: Normal LVF 55-60%** , mildly dilated aortic root(38 mm) and ascending aorta (44 mm). Compared to prior echo, LVEF has improved.  . TRANSTHORACIC ECHOCARDIOGRAM  7/'20; 9/'20   a) Hemorrhagic CVA/SAH - EF> 65%.  Moderate concentric LVH.-GR 1 DD mild RA dilation.; b) EF 60 to 65%.  Mild LVH.  GRII DD.  Normal RV size and function.  Mild LA dilation.  Normal RA size.  Unable to assess PA pressures.  No significant aortic sclerosis.  . TRANSURETHRAL RESECTION OF PROSTATE N/A 05/30/2017   Procedure: TRANSURETHRAL RESECTION OF THE PROSTATE (TURP);  Surgeon: [name], MD;  Location: [facility];  Service: Urology;  Laterality: N/A;    reports that Ronald [name] has been smoking cigarettes. Ronald [name] has a 15.00 pack-year smoking history. Ronald [name] has never used smokeless tobacco. Ronald [name] reports previous alcohol use. Ronald [name] reports that Ronald [name] does not use drugs. family history includes Breast cancer in his mother; Ovarian cancer in his sister; Stroke in his mother and another family member. No Known Allergies  Wt Readings from Last 3 Encounters:  05/19/19 220 lb 9.6 oz (100.1 kg)  02/16/19 218 lb (98.9 kg)  02/10/19 223 lb (101.2 kg)     Review of Systems  Constitutional: Negative for appetite change, fatigue and unexpected weight change.  Eyes: Negative for visual disturbance.  Respiratory: Negative for cough, chest tightness and shortness of breath.   Cardiovascular: Negative for chest pain, palpitations and leg swelling.  Gastrointestinal: Negative for abdominal pain.  Endocrine: Negative for polydipsia and polyuria.  Genitourinary: Negative for dysuria.   Neurological: Negative for dizziness, syncope, weakness, light-headedness and headaches.       Objective:   Physical Exam Vitals reviewed.  Constitutional:      Appearance: Normal appearance.  Cardiovascular:     Rate and Rhythm: Normal rate.  Pulmonary:     Effort: Pulmonary effort is normal.     Breath sounds: Normal breath sounds.  Musculoskeletal:     Right lower leg: No edema.     Left lower leg: No edema.  Neurological:     Mental Status: Ronald [name] is alert.        Assessment:     #1 type 2 diabetes well controlled with A1c today 5.9%  #2 hypertension stable and at goal  #3 history of atrial fibrillation.  Currently rate controlled and on Eliquis    Plan:     -Check further labs with  TSH, magnesium, comprehensive metabolic panel -Continue current diabetic medication regimen -We will spread diabetes follow-up to every 6 months since Ronald [name] is well controlled  [name] MD [facility]

## 2019-05-22 ENCOUNTER — Telehealth: Payer: Self-pay | Admitting: Family Medicine

## 2019-05-22 NOTE — Telephone Encounter (Signed)
Please see message. °

## 2019-05-22 NOTE — Telephone Encounter (Signed)
[facility] is wondering if Dr. Vedia Coffer the CMN for a wheel chair. Pt received it from the hospital for physical medication/rehab but the pt changed to medicare primary before it was paid off.  They can be reached at [phone] Or if Dr. Has the form he may Fax:  [phone]

## 2019-05-24 NOTE — Telephone Encounter (Signed)
I have not seen anything regarding this.

## 2019-05-26 ENCOUNTER — Ambulatory Visit (INDEPENDENT_AMBULATORY_CARE_PROVIDER_SITE_OTHER): Payer: Medicare Other

## 2019-05-26 VITALS — BP 128/78 | Ht 71.0 in | Wt 221.0 lb

## 2019-05-26 DIAGNOSIS — Z0000 Encounter for general adult medical examination without abnormal findings: Secondary | ICD-10-CM

## 2019-05-26 LAB — BLOOD GAS, ARTERIAL
Acid-Base Excess: 4.9 mmol/L — ABNORMAL HIGH (ref 0.0–2.0)
Bicarbonate: 28.4 mmol/L — ABNORMAL HIGH (ref 20.0–28.0)
Drawn by: 246861
O2 Saturation: 88.9 %
Patient temperature: 98.6
pCO2 arterial: 38 mmHg (ref 32.0–48.0)
pH, Arterial: 7.486 — ABNORMAL HIGH (ref 7.350–7.450)
pO2, Arterial: 53.6 mmHg — ABNORMAL LOW (ref 83.0–108.0)

## 2019-05-26 NOTE — Progress Notes (Signed)
This visit is being conducted via phone call due to the COVID-19 pandemic. This patient has given me verbal consent via phone to conduct this visit, patient states they are participating from their home address. Some vital signs may be absent or patient reported.   Patient identification: identified by name, DOB, and current address.  Location provider: [facility], Office Persons participating in the virtual visit: Ronald [name] and Ronald Forts, LPN.    Subjective:   Ronald [name] is a 74 y.o. male who presents for an Initial Medicare Annual Wellness Visit.  Ronald [name] is doing well. He is able to ambulate without assistance. He does not participate in regular physical exercise and tries to eat healthy, balanced meals. He reports blood sugars range 101-130's. He has not yet made up his mind about a covid vaccine yet.  Review of Systems  No ROS; Annual Medicare Wellness Visit Cardiac Risk Factors include: male gender;advanced age (>51mn, >>63women);sedentary lifestyle    Objective:    Today's Vitals   05/26/19 1129  BP: 128/78  Weight: 221 lb (100.2 kg)  Height: _0  (1.803 m)   Body mass index is 30.82 kg/m.  Advanced Directives 05/26/2019 01/03/2019 11/27/2018 11/24/2018 11/13/2018 10/22/2018 10/21/2018  Does Patient Have a Medical Advance Directive? Yes No Unable to assess, patient is non-responsive or altered mental status Yes Yes Yes Yes  Type of AScientist, forensicPower of AKremlinLiving will - Healthcare Power of [place]of [place]of [place] Does patient want to make changes to medical advance directive? No - Patient declined - - No - Patient declined - No - Patient declined -  Copy of HNeoshoin Chart? No - copy requested - (No Data) No - copy requested No - copy requested No - copy requested No - copy requested  Would patient like  information on creating a medical advance directive? - No - Guardian declined - - - - -    Current Medications (verified) Outpatient Encounter Medications as of 05/26/2019  Medication Sig  . albuterol (VENTOLIN HFA) 108 (90 Base) MCG/ACT inhaler Inhale 2 puffs into the lungs every 6 (six) hours as needed for wheezing or shortness of breath.  .Marland Kitchenamiodarone (PACERONE) 200 MG tablet Take 0.5 tablets (100 mg total) by mouth daily.  .Marland Kitchenapixaban (ELIQUIS) 5 MG TABS tablet Take 1 tablet (5 mg total) by mouth 2 (two) times daily.  .Marland Kitchenatorvastatin (LIPITOR) 10 MG tablet TAKE 1 TABLET BY MOUTH DAILY EACH EVENING  . azelastine (ASTELIN) 0.1 % nasal spray Place 1 spray into both nostrils 2 (two) times daily as needed for rhinitis or allergies.  . Blood Glucose Monitoring Suppl (ACCU-CHEK AVIVA PLUS) w/Device KIT Use blood glucose machine as instructed on your strips  . budesonide-formoterol (SYMBICORT) 160-4.5 MCG/ACT inhaler Inhale 2 puffs into the lungs 2 (two) times daily.  . carvedilol (COREG) 6.25 MG tablet Take 3.125 mg by mouth 2 (two) times daily with a meal.  . colchicine 0.6 MG tablet TAKE 1 TABLET (0.6 MG TOTAL) BY MOUTH 2 (TWO) TIMES DAILY AS NEEDED (GOUT FLARE).  .Marland Kitchendiclofenac sodium (VOLTAREN) 1 % GEL Apply 4 g topically 4 (four) times daily.  . divalproex (DEPAKOTE) 500 MG DR tablet Take 1 tablet (500 mg total) by mouth every 12 (twelve) hours.  . furosemide (LASIX) 40 MG tablet Take 1 tablet (40 mg total) by mouth 2 (two) times daily with  breakfast and lunch.  . glimepiride (AMARYL) 2 MG tablet Take 1 tablet (2 mg total) by mouth daily with breakfast.  . glucose blood (ACCU-CHEK AVIVA PLUS) test strip Test 2 times daily dx e11.9  . HYDROcodone-acetaminophen (NORCO/VICODIN) 5-325 MG tablet Take 1 tablet by mouth every 6 (six) hours as needed for moderate pain.  Marland Kitchen KLOR-CON M20 20 MEQ tablet TAKE 2 TABLETS BY MOUTH EVERY DAY  . Lancets (ACCU-CHEK SOFT TOUCH) lancets Check blood sugars once per day.  DX: E11.9  . metFORMIN (GLUCOPHAGE) 500 MG tablet TAKE 2 TABLETS (1,000 MG TOTAL) BY MOUTH 2 (TWO) TIMES DAILY WITH A MEAL.  . montelukast (SINGULAIR) 10 MG tablet TAKE 1 TABLET BY MOUTH EVERYDAY AT BEDTIME  . MYRBETRIQ 25 MG TB24 tablet Take 25 mg by mouth daily.  Marland Kitchen PRESCRIPTION MEDICATION Inhale into the lungs at bedtime. CPAP  . QUEtiapine (SEROQUEL) 25 MG tablet TAKE 1 TABLET BY MOUTH EVERYDAY AT BEDTIME   No facility-administered encounter medications on file as of 05/26/2019.    Allergies (verified) Patient has no known allergies.   History: Past Medical History:  Diagnosis Date  . Allergic rhinitis   . Anticoagulant long-term use    eliquis  . Atrial fibrillation ([place])   . Cardiomyopathy due to systemic disease [facility])    followed by dr harding  . COPD with emphysema [facility])    pulmologist-  dr Halford Chessman  . Dyspnea    occasional per pt  . Heart disease   . History of colon polyps    tubular adenoma 2013  . History of gout    09-05-2017 last flare-up  05/ 2019 3 wks ago, feet  . History of sepsis 02/18/2017   per d/c note probable uti, acute chf, acute renal failure, hypoxia  . History of YAG laser capsulotomy of lens December 2020, January 2021  . Hyperlipidemia   . Hyperplasia of prostate with lower urinary tract symptoms (LUTS)   . Hypertension   . OSA on CPAP    per study 08-03-2004  Severe OSA  . Persistent atrial fibrillation [facility])    cardiologist --  dr Dorris Carnes--  post cardioversion 05-07-2014  . Prostate cancer [facility]) urologsit-  dr Alyson Ingles--- as of 05-21-2017 per pt last PSA 11 approx.   Dx  2014--  stage T1c, Gleason 3+3=6, PSA 6.67--  Active surveillance/  04/ 2019  Stage T1b, Gleason 3+4, PSA 12.8- plan external radiation therapy    . 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 Seasonal allergies   . Sigmoid diverticulosis   . Systolic and diastolic CHF, chronic [facility])    cardiologist-  [name]  . Tinea  versicolor   . Type 2 diabetes mellitus ([place])   . Wears hearing aid in both ears    Past Surgical History:  Procedure Laterality Date  . CARDIOVERSION N/A 05/07/2014   Procedure: CARDIOVERSION;  Surgeon: [name], MD;  Location: [facility] ENDOSCOPY;  Service: Cardiovascular;  Laterality: N/A;  . CARDIOVERSION N/A 09/09/2014   Procedure: CARDIOVERSION;  Surgeon: Lelon Perla, MD;  Location: [facility] ENDOSCOPY;  Service: Cardiovascular;  Laterality: N/A;  successfully  . CATARACT EXTRACTION W/ INTRAOCULAR LENS  IMPLANT, BILATERAL  08 and 09/  2018  . COLONOSCOPY  last one 06-07-2011  . GOLD SEED IMPLANT N/A 09/09/2017   Procedure: GOLD SEED IMPLANT;  Surgeon: [name], MD;  Location: [facility];  Service: Urology;  Laterality: N/A;  .  HYDROCELE EXCISION Bilateral 09/26/2015   Procedure: HYDROCELECTOMY ADULT;  Surgeon: [name], MD;  Location: [facility];  Service: Urology;  Laterality: Bilateral;  . LAMINECTOMY AND MICRODISCECTOMY LUMBAR SPINE  12-23-2003   Left L5 -- S1  . LAPAROSCOPIC BILATERAL INGUINAL HERNIA REPAIR/  UMBILICAL HERNIA REPAIR WITH MESH/  ASPIRATION LEFT HYDROCELE  07-11-2012  . NM MYOVIEW LTD  05/18/2014   Low risk study. Normal perfusion: No ischemia or infarction. Mild LV dysfunction - 46% (does not correlate with echocardiographic EF of 50-55%)  . PROSTATE BIOPSY  05/15/12   Clinically both Lobes  . SPACE OAR INSTILLATION N/A 09/09/2017   Procedure: SPACE OAR INSTILLATION;  Surgeon: [name], MD;  Location: [facility];  Service: Urology;  Laterality: N/A;  . TEE WITHOUT CARDIOVERSION N/A 05/07/2014   Procedure: TRANSESOPHAGEAL ECHOCARDIOGRAM (TEE);  Surgeon: [name], MD;  Location: [facility] ENDOSCOPY;  Service: Cardiovascular;  Laterality: N/A;   mild atherosclerosis plaque of aorta,  mild AR, MR, and TR,  no cardiac source of emboli  . TONSILLECTOMY  as child  . TRANSTHORACIC ECHOCARDIOGRAM  11/'18; 1/'19     a) In setting of sepsis: EF of 40-45%.  Diffuse hypokinesis.  No RWMA.  Biatrial enlargement.;; b) ** f/u Jan 2019: Normal LVF 55-60%** , mildly dilated aortic root(38 mm) and ascending aorta (44 mm). Compared to prior echo, LVEF has improved.  . TRANSTHORACIC ECHOCARDIOGRAM  7/'20; 9/'20   a) Hemorrhagic CVA/SAH - EF> 65%.  Moderate concentric LVH.-GR 1 DD mild RA dilation.; b) EF 60 to 65%.  Mild LVH.  GRII DD.  Normal RV size and function.  Mild LA dilation.  Normal RA size.  Unable to assess PA pressures.  No significant aortic sclerosis.  . TRANSURETHRAL RESECTION OF PROSTATE N/A 05/30/2017   Procedure: TRANSURETHRAL RESECTION OF THE PROSTATE (TURP);  Surgeon: [name], MD;  [facility];  Service: Urology;  Laterality: N/A;   Family History  Problem Relation Age of Onset  . Breast cancer Mother   . Stroke Mother   . Stroke Other        Unknown   . Ovarian cancer Sister   . Colon cancer Neg Hx   . Esophageal cancer Neg Hx   . Rectal cancer Neg Hx   . Stomach cancer Neg Hx    Social History   Socioeconomic History  . Marital status: Married    Spouse name: Not on file  . Number of children: 2  . Years of education: 16  . Highest education level: Bachelor's degree (e.g., BA, AB, BS)  Occupational History  . Occupation: Scientist, clinical (histocompatibility and immunogenetics): [employer]  Tobacco Use  . Smoking status: Current Every Day Smoker    Packs/day: 1.00    Years: 60.00    Pack years: 60.00    Types: Cigarettes  . Smokeless tobacco: Never Used  . Tobacco comment: 09-05-2017 tried quitting smoking 11/ 2018 but started back at 0.75ppd from 1ppd  Substance and Sexual Activity  . Alcohol use: Not Currently  . Drug use: No  . Sexual activity: Not on file  Other Topics Concern  . Not on file  Social History Narrative   Married 25 years   2 children but not with this wife   Left handed    Likes to read, netflix, tv   Social Determinants of Health   Financial Resource Strain: Low  Risk   . Difficulty of Paying Living Expenses: Not  hard at all  Food Insecurity: No Food Insecurity  . Worried About Charity fundraiser in the Last Year: Never true  . Ran Out of Food in the Last Year: Never true  Transportation Needs: No Transportation Needs  . Lack of Transportation (Medical): No  . Lack of Transportation (Non-Medical): No  Physical Activity: Inactive  . Days of Exercise per Week: 0 days  . Minutes of Exercise per Session: 0 min  Stress: No Stress Concern Present  . Feeling of Stress : Only a little  Social Connections: Somewhat Isolated  . Frequency of Communication with Friends and Family: More than three times a week  . Frequency of Social Gatherings with Friends and Family: Once a week  . Attends Religious Services: Never  . Active Member of Clubs or Organizations: No  . Attends Archivist Meetings: Never  . Marital Status: Married   Tobacco Counseling Ready to quit: Not Answered Counseling given: Not Answered Comment: 09-05-2017 tried quitting smoking 11/ 2018 but started back at 0.75ppd from 1ppd   Clinical Intake:  Pre-visit preparation completed: Yes  Pain : No/denies pain     BMI - recorded: 30.82 Nutritional Status: BMI > 30  Obese Diabetes: Yes(101 recently) CBG done?: No Did pt. bring in CBG monitor from home?: No(N/A)  How often do you need to have someone help you when you read instructions, pamphlets, or other written materials from your doctor or pharmacy?: 1 - Never  Interpreter Needed?: No  Information entered by :: Ronald Forts, LPN.  Activities of Daily Living In your present state of health, do you have any difficulty performing the following activities: 05/26/2019 01/04/2019  Hearing? N N  Vision? N N  Difficulty concentrating or making decisions? N N  Walking or climbing stairs? N Y  Dressing or bathing? N N  Doing errands, shopping? N Y  Conservation officer, nature and eating ? N -  Using the Toilet? N -  In the past  six months, have you accidently leaked urine? N -  Do you have problems with loss of bowel control? N -  Managing your Medications? N -  Managing your Finances? N -  Housekeeping or managing your Housekeeping? N -  Some recent data might be hidden     Immunizations and Health Maintenance Immunization History  Administered Date(s) Administered  . Fluad Quad(high Dose 65+) 01/16/2019  . Influenza Split 02/12/2011, 01/08/2012  . Influenza Whole 01/08/2008  . Influenza, High Dose Seasonal PF 01/02/2016, 12/05/2016, 01/16/2018  . Influenza,inj,Quad PF,6+ Mos 04/17/2013, 01/20/2015, 12/22/2015  . Pneumococcal Conjugate-13 10/15/2013  . Pneumococcal Polysaccharide-23 05/15/2011  . Tdap 05/15/2011   Health Maintenance Due  Topic Date Due  . Hepatitis C Screening  January 01, 1946  . URINE MICROALBUMIN  05/21/1955  . FOOT EXAM  12/05/2017    Patient Care Team: [name], MD as PCP - General Leonie Man, MD as PCP - Cardiology (Cardiology) Alyson Ingles Candee Furbish, MD as Consulting Physician (Urology)  Indicate any recent Medical Services you may have received from other than [facility] providers in the past year (date may be approximate).    Assessment:   This is a routine wellness examination for Danniel.  Hearing/Vision screen  Hearing Screening   _0  _1  _2  _3  _4  _5  _6  _7  _8   Right ear:           Left ear:           Comments: Wears bilateral hearing aids  Vision Screening Comments: Hx  cataract surgery; saw eye provider in Jan 2021.  Dietary issues and exercise activities discussed: Current Exercise Habits: Home exercise routine, Exercise limited by: neurologic condition(s)  Goals    . Increase physical activity    . Quit Smoking      Depression Screen PHQ 2/9 Scores 05/26/2019 12/24/2018 06/20/2017 12/05/2016  PHQ - 2 Score 0 0 0 0    Fall Risk Fall Risk  05/26/2019 02/10/2019 12/24/2018 06/20/2017 07/04/2015  Falls in the past year? 0 0 0 No No   Risk for fall due to : Medication side effect - - - -  Follow up Falls evaluation completed;Education provided;Falls prevention discussed - - - -    Is the patient's home free of loose throw rugs in walkways, pet beds, electrical cords, etc?   yes      Grab bars in the bathroom? yes      Handrails on the stairs?   yes      Adequate lighting?   yes  Timed Get Up and Go performed: N/A due to telephone visit.  Cognitive Function: MMSE - Mini Mental State Exam 01/13/2019  Orientation to time 5  Orientation to Place 3  Registration 3  Attention/ Calculation 5  Recall 3  Language- name 2 objects 2  Language- repeat 1  Language- follow 3 step command 3  Language- read & follow direction 1  Write a sentence 1  Copy design 1  Total score 28     6CIT Screen 05/26/2019  What Year? 0 points  What month? 0 points  What time? 0 points  Count back from 20 0 points  Months in reverse 0 points  Repeat phrase 0 points  Total Score 0    Screening Tests Health Maintenance  Topic Date Due  . Hepatitis C Screening  May 14, 1945  . URINE MICROALBUMIN  05/21/1955  . FOOT EXAM  12/05/2017  . COLONOSCOPY  09/06/2019  . HEMOGLOBIN A1C  11/16/2019  . OPHTHALMOLOGY EXAM  03/17/2020  . TETANUS/TDAP  05/14/2021  . INFLUENZA VACCINE  Completed  . PNA vac Low Risk Adult  Completed    Qualifies for Shingles Vaccine? yes  Cancer Screenings: Lung: Low Dose CT Chest recommended if Age 74-80 years, 30 pack-year currently smoking OR have quit w/in 15years. Patient does not qualify. Colorectal: yes; colonoscopy due 09/08/2019.   Additional Screenings:  Hepatitis C Screening: please collect at next office visit with labs.    Plan:    Mr. Hofmann needs a diabetic foot exam and Hep C screening at next office visit. He understands to check out of pocket costs for shingix with his pharmacy or insurance company. He is up to date with all other immunizations.  I have personally reviewed and noted the  following in the patient's chart:   . Medical and social history . Use of alcohol, tobacco or illicit drugs  . Current medications and supplements . Functional ability and status . Nutritional status . Physical activity . Advanced directives . List of other physicians . Hospitalizations, surgeries, and ER visits in previous 12 months . Vitals . Screenings to include cognitive, depression, and falls . Referrals and appointments  In addition, I have reviewed and discussed with patient certain preventive protocols, quality metrics, and best practice recommendations. A written personalized care plan for preventive services as well as general preventive health recommendations were provided to patient.     Ronald Forts, LPN   12/30/3005

## 2019-05-26 NOTE — Telephone Encounter (Signed)
Called patient and LMOVM to return call  Left a detailed voice message to let them know we did not receive this request.

## 2019-05-26 NOTE — Patient Instructions (Addendum)
Mr. Piotrowicz , Thank you for taking time to participate in your Medicare Wellness Visit. I appreciate your ongoing commitment to your health goals. Please review the following plan we discussed and let me know if I can assist you in the future.   Screening recommendations/referrals: Colorectal Screening: colonoscopy completed 09/05/2016; due 09/08/2019.   Vision and Dental Exams: Recommended annual ophthalmology exams for early detection of glaucoma and other disorders of the eye Recommended annual dental exams for proper oral hygiene  Diabetic Exams: Diabetic Eye Exam: completed 03/18/2019. Up to date.  Diabetic Foot Exam: past due. Dr Elease Hashimoto to do at next office visit with diabetes follow up.  Vaccinations: Influenza vaccine: completed 01/16/2019. Due in fall 2021.  Pneumococcal vaccine: completed 05/15/2011 & 10/15/2013. Up to date.  Tdap vaccine: completed 05/15/2011. Due 05/14/2021. Shingles vaccine: Please call your insurance company to determine your out of pocket expense for the Shingrix vaccine. You may receive this vaccine at your local pharmacy.  Advanced directives: Advance directives discussed with you today. Please bring a copy of your POA (Power of [place]) and/or Living Will to your next appointment.  Goals: Recommend to drink at least 6-8 8oz glasses of water per day.  Recommend to exercise for at least 150 minutes per week.  Recommend to remove any items from the home that may cause slips or trips.  Recommend to decrease portion sizes by eating 3 small healthy meals and at least 2 healthy snacks per day.  Recommend to continue efforts to reduce smoking habits until no longer smoking. Smoking Cessation literature is attached below.  Next appointment: Please schedule your Annual Wellness Visit with your Nurse Health Advisor in one year.  Preventive Care 42 Years and Older, Male Preventive care refers to lifestyle choices and visits with your health care  provider that can promote health and wellness. What does preventive care include?  A yearly physical exam. This is also called an annual well check.  Dental exams once or twice a year.  Routine eye exams. Ask your health care provider how often you should have your eyes checked.  Personal lifestyle choices, including:  Daily care of your teeth and gums.  Regular physical activity.  Eating a healthy diet.  Avoiding tobacco and drug use.  Limiting alcohol use.  Practicing safe sex.  Taking low doses of aspirin every day if recommended by your health care provider..  Taking vitamin and mineral supplements as recommended by your health care provider. What happens during an annual well check? The services and screenings done by your health care provider during your annual well check will depend on your age, overall health, lifestyle risk factors, and family history of disease. Counseling  Your health care provider may ask you questions about your:  Alcohol use.  Tobacco use.  Drug use.  Emotional well-being.  Home and relationship well-being.  Sexual activity.  Eating habits.  History of falls.  Memory and ability to understand (cognition).  Work and work Statistician. Screening  You may have the following tests or measurements:  Height, weight, and BMI.  Blood pressure.  Lipid and cholesterol levels. These may be checked every 5 years, or more frequently if you are over 97 years old.  Skin check.  Lung cancer screening. You may have this screening every year starting at age 41 if you have a 30-pack-year history of smoking and currently smoke or have quit within the past 15 years.  Fecal occult blood test (FOBT) of the stool. You may have  this test every year starting at age 49.  Flexible sigmoidoscopy or colonoscopy. You may have a sigmoidoscopy every 5 years or a colonoscopy every 10 years starting at age 24.  Prostate cancer screening. Recommendations  will vary depending on your family history and other risks.  Hepatitis C blood test.  Hepatitis B blood test.  Sexually transmitted disease (STD) testing.  Diabetes screening. This is done by checking your blood sugar (glucose) after you have not eaten for a while (fasting). You may have this done every 1-3 years.  Abdominal aortic aneurysm (AAA) screening. You may need this if you are a current or former smoker.  Osteoporosis. You may be screened starting at age 83 if you are at high risk. Talk with your health care provider about your test results, treatment options, and if necessary, the need for more tests. Vaccines  Your health care provider may recommend certain vaccines, such as:  Influenza vaccine. This is recommended every year.  Tetanus, diphtheria, and acellular pertussis (Tdap, Td) vaccine. You may need a Td booster every 10 years.  Zoster vaccine. You may need this after age 92.  Pneumococcal 13-valent conjugate (PCV13) vaccine. One dose is recommended after age 49.  Pneumococcal polysaccharide (PPSV23) vaccine. One dose is recommended after age 95. Talk to your health care provider about which screenings and vaccines you need and how often you need them. This information is not intended to replace advice given to you by your health care provider. Make sure you discuss any questions you have with your health care provider. Document Released: 04/22/2015 Document Revised: 12/14/2015 Document Reviewed: 01/25/2015 Elsevier Interactive Patient Education  2017 [place] Prevention in the Home Falls can cause injuries. They can happen to people of all ages. There are many things you can do to make your home safe and to help prevent falls. What can I do on the outside of my home?  Regularly fix the edges of walkways and driveways and fix any cracks.  Remove anything that might make you trip as you walk through a door, such as a raised step or threshold.  Trim  any bushes or trees on the path to your home.  Use bright outdoor lighting.  Clear any walking paths of anything that might make someone trip, such as rocks or tools.  Regularly check to see if handrails are loose or broken. Make sure that both sides of any steps have handrails.  Any raised decks and porches should have guardrails on the edges.  Have any leaves, snow, or ice cleared regularly.  Use sand or salt on walking paths during winter.  Clean up any spills in your garage right away. This includes oil or grease spills. What can I do in the bathroom?  Use night lights.  Install grab bars by the toilet and in the tub and shower. Do not use towel bars as grab bars.  Use non-skid mats or decals in the tub or shower.  If you need to sit down in the shower, use a plastic, non-slip stool.  Keep the floor dry. Clean up any water that spills on the floor as soon as it happens.  Remove soap buildup in the tub or shower regularly.  Attach bath mats securely with double-sided non-slip rug tape.  Do not have throw rugs and other things on the floor that can make you trip. What can I do in the bedroom?  Use night lights.  Make sure that you have a light by  your bed that is easy to reach.  Do not use any sheets or blankets that are too big for your bed. They should not hang down onto the floor.  Have a firm chair that has side arms. You can use this for support while you get dressed.  Do not have throw rugs and other things on the floor that can make you trip. What can I do in the kitchen?  Clean up any spills right away.  Avoid walking on wet floors.  Keep items that you use a lot in easy-to-reach places.  If you need to reach something above you, use a strong step stool that has a grab bar.  Keep electrical cords out of the way.  Do not use floor polish or wax that makes floors slippery. If you must use wax, use non-skid floor wax.  Do not have throw rugs and other  things on the floor that can make you trip. What can I do with my stairs?  Do not leave any items on the stairs.  Make sure that there are handrails on both sides of the stairs and use them. Fix handrails that are broken or loose. Make sure that handrails are as long as the stairways.  Check any carpeting to make sure that it is firmly attached to the stairs. Fix any carpet that is loose or worn.  Avoid having throw rugs at the top or bottom of the stairs. If you do have throw rugs, attach them to the floor with carpet tape.  Make sure that you have a light switch at the top of the stairs and the bottom of the stairs. If you do not have them, ask someone to add them for you. What else can I do to help prevent falls?  Wear shoes that:  Do not have high heels.  Have rubber bottoms.  Are comfortable and fit you well.  Are closed at the toe. Do not wear sandals.  If you use a stepladder:  Make sure that it is fully opened. Do not climb a closed stepladder.  Make sure that both sides of the stepladder are locked into place.  Ask someone to hold it for you, if possible.  Clearly mark and make sure that you can see:  Any grab bars or handrails.  First and last steps.  Where the edge of each step is.  Use tools that help you move around (mobility aids) if they are needed. These include:  Canes.  Walkers.  Scooters.  Crutches.  Turn on the lights when you go into a dark area. Replace any light bulbs as soon as they burn out.  Set up your furniture so you have a clear path. Avoid moving your furniture around.  If any of your floors are uneven, fix them.  If there are any pets around you, be aware of where they are.  Review your medicines with your doctor. Some medicines can make you feel dizzy. This can increase your chance of falling. Ask your doctor what other things that you can do to help prevent falls. This information is not intended to replace advice given to  you by your health care provider. Make sure you discuss any questions you have with your health care provider. Document Released: 01/20/2009 Document Revised: 09/01/2015 Document Reviewed: 04/30/2014 Elsevier Interactive Patient Education  2017 [employer] Inc[employer]  Diabetes Mellitus and [place] care is an important part of your health, especially when you have diabetes. Diabetes may cause you  to have problems because of poor blood flow (circulation) to your feet and legs, which can cause your skin to:  Become thinner and drier.  Break more easily.  Heal more slowly.  Peel and crack. You may also have nerve damage (neuropathy) in your legs and feet, causing decreased feeling in them. This means that you may not notice minor injuries to your feet that could lead to more serious problems. Noticing and addressing any potential problems early is the best way to prevent future foot problems. How to care for your feet Foot hygiene  Wash your feet daily with warm water and mild soap. Do not use hot water. Then, pat your feet and the areas between your toes until they are completely dry. Do not soak your feet as this can dry your skin.  Trim your toenails straight across. Do not dig under them or around the cuticle. File the edges of your nails with an emery board or nail file.  Apply a moisturizing lotion or petroleum jelly to the skin on your feet and to dry, brittle toenails. Use lotion that does not contain alcohol and is unscented. Do not apply lotion between your toes. Shoes and socks  Wear clean socks or stockings every day. Make sure they are not too tight. Do not wear knee-high stockings since they may decrease blood flow to your legs.  Wear shoes that fit properly and have enough cushioning. Always look in your shoes before you put them on to be sure there are no objects inside.  To break in new shoes, wear them for just a few hours a day. This prevents injuries on your feet. Wounds,  scrapes, corns, and calluses  Check your feet daily for blisters, cuts, bruises, sores, and redness. If you cannot see the bottom of your feet, use a mirror or ask someone for help.  Do not cut corns or calluses or try to remove them with medicine.  If you find a minor scrape, cut, or break in the skin on your feet, keep it and the skin around it clean and dry. You may clean these areas with mild soap and water. Do not clean the area with peroxide, alcohol, or iodine.  If you have a wound, scrape, corn, or callus on your foot, look at it several times a day to make sure it is healing and not infected. Check for: ? Redness, swelling, or pain. ? Fluid or blood. ? Warmth. ? Pus or a bad smell. General instructions  Do not cross your legs. This may decrease blood flow to your feet.  Do not use heating pads or hot water bottles on your feet. They may burn your skin. If you have lost feeling in your feet or legs, you may not know this is happening until it is too late.  Protect your feet from hot and cold by wearing shoes, such as at the beach or on hot pavement.  Schedule a complete foot exam at least once a year (annually) or more often if you have foot problems. If you have foot problems, report any cuts, sores, or bruises to your health care provider immediately. Contact a health care provider if:  You have a medical condition that increases your risk of infection and you have any cuts, sores, or bruises on your feet.  You have an injury that is not healing.  You have redness on your legs or feet.  You feel burning or tingling in your legs or feet.  You  have pain or cramps in your legs and feet.  Your legs or feet are numb.  Your feet always feel cold.  You have pain around a toenail. Get help right away if:  You have a wound, scrape, corn, or callus on your foot and: ? You have pain, swelling, or redness that gets worse. ? You have fluid or blood coming from the wound,  scrape, corn, or callus. ? Your wound, scrape, corn, or callus feels warm to the touch. ? You have pus or a bad smell coming from the wound, scrape, corn, or callus. ? You have a fever. ? You have a red line going up your leg. Summary  Check your feet every day for cuts, sores, red spots, swelling, and blisters.  Moisturize feet and legs daily.  Wear shoes that fit properly and have enough cushioning.  If you have foot problems, report any cuts, sores, or bruises to your health care provider immediately.  Schedule a complete foot exam at least once a year (annually) or more often if you have foot problems. This information is not intended to replace advice given to you by your health care provider. Make sure you discuss any questions you have with your health care provider. Document Revised: 12/17/2018 Document Reviewed: 04/27/2016 Elsevier Patient Education  2020 [employer].   Steps to Quit Smoking Smoking tobacco is the leading cause of preventable death. It can affect almost every organ in the body. Smoking puts you and people around you at risk for many serious, long-lasting (chronic) diseases. Quitting smoking can be hard, but it is one of the best things that you can do for your health. It is never too late to quit. How do I get ready to quit? When you decide to quit smoking, make a plan to help you succeed. Before you quit:  Pick a date to quit. Set a date within the next 2 weeks to give you time to prepare.  Write down the reasons why you are quitting. Keep this list in places where you will see it often.  Tell your family, friends, and co-workers that you are quitting. Their support is important.  Talk with your doctor about the choices that may help you quit.  Find out if your health insurance will pay for these treatments.  Know the people, places, things, and activities that make you want to smoke (triggers). Avoid them. What first steps can I take to quit  smoking?  Throw away all cigarettes at home, at work, and in your car.  Throw away the things that you use when you smoke, such as ashtrays and lighters.  Clean your car. Make sure to empty the ashtray.  Clean your home, including curtains and carpets. What can I do to help me quit smoking? Talk with your doctor about taking medicines and seeing a counselor at the same time. You are more likely to succeed when you do both.  If you are pregnant or breastfeeding, talk with your doctor about counseling or other ways to quit smoking. Do not take medicine to help you quit smoking unless your doctor tells you to do so. To quit smoking: Quit right away  Quit smoking totally, instead of slowly cutting back on how much you smoke over a period of time.  Go to counseling. You are more likely to quit if you go to counseling sessions regularly. Take medicine You may take medicines to help you quit. Some medicines need a prescription, and some you can  buy over-the-counter. Some medicines may contain a drug called nicotine to replace the nicotine in cigarettes. Medicines may:  Help you to stop having the desire to smoke (cravings).  Help to stop the problems that come when you stop smoking (withdrawal symptoms). Your doctor may ask you to use:  Nicotine patches, gum, or lozenges.  Nicotine inhalers or sprays.  Non-nicotine medicine that is taken by mouth. Find resources Find resources and other ways to help you quit smoking and remain smoke-free after you quit. These resources are most helpful when you use them often. They include:  Online chats with a Social worker.  Phone quitlines.  Printed Furniture conservator/restorer.  Support groups or group counseling.  Text messaging programs.  Mobile phone apps. Use apps on your mobile phone or tablet that can help you stick to your quit plan. There are many free apps for mobile phones and tablets as well as websites. Examples include Quit Guide from the  [employer] and smokefree.gov  What things can I do to make it easier to quit?   Talk to your family and friends. Ask them to support and encourage you.  Call a phone quitline (1-800-QUIT-NOW), reach out to support groups, or work with a Social worker.  Ask people who smoke to not smoke around you.  Avoid places that make you want to smoke, such as: ? Bars. ? Parties. ? Smoke-break areas at work.  Spend time with people who do not smoke.  Lower the stress in your life. Stress can make you want to smoke. Try these things to help your stress: ? Getting regular exercise. ? Doing deep-breathing exercises. ? Doing yoga. ? Meditating. ? Doing a body scan. To do this, close your eyes, focus on one area of your body at a time from head to toe. Notice which parts of your body are tense. Try to relax the muscles in those areas. How will I feel when I quit smoking? Day 1 to 3 weeks Within the first 24 hours, you may start to have some problems that come from quitting tobacco. These problems are very bad 2-3 days after you quit, but they do not often last for more than 2-3 weeks. You may get these symptoms:  Mood swings.  Feeling restless, nervous, angry, or annoyed.  Trouble concentrating.  Dizziness.  Strong desire for high-sugar foods and nicotine.  Weight gain.  Trouble pooping (constipation).  Feeling like you may vomit (nausea).  Coughing or a sore throat.  Changes in how the medicines that you take for other issues work in your body.  Depression.  Trouble sleeping (insomnia). Week 3 and afterward After the first 2-3 weeks of quitting, you may start to notice more positive results, such as:  Better sense of smell and taste.  Less coughing and sore throat.  Slower heart rate.  Lower blood pressure.  Clearer skin.  Better breathing.  Fewer sick days. Quitting smoking can be hard. Do not give up if you fail the first time. Some people need to try a few times before they  succeed. Do your best to stick to your quit plan, and talk with your doctor if you have any questions or concerns. Summary  Smoking tobacco is the leading cause of preventable death. Quitting smoking can be hard, but it is one of the best things that you can do for your health.  When you decide to quit smoking, make a plan to help you succeed.  Quit smoking right away, not slowly over a period  of time.  When you start quitting, seek help from your doctor, family, or friends. This information is not intended to replace advice given to you by your health care provider. Make sure you discuss any questions you have with your health care provider. Document Revised: 12/19/2018 Document Reviewed: 06/14/2018 Elsevier Patient Education  [place].

## 2019-05-28 ENCOUNTER — Ambulatory Visit: Payer: Federal, State, Local not specified - PPO | Admitting: Neurology

## 2019-06-10 ENCOUNTER — Other Ambulatory Visit: Payer: Self-pay | Admitting: Neurology

## 2019-06-19 ENCOUNTER — Encounter: Payer: Self-pay | Admitting: Cardiology

## 2019-06-19 ENCOUNTER — Other Ambulatory Visit: Payer: Self-pay

## 2019-06-19 ENCOUNTER — Ambulatory Visit (INDEPENDENT_AMBULATORY_CARE_PROVIDER_SITE_OTHER): Payer: Medicare Other | Admitting: Cardiology

## 2019-06-19 VITALS — BP 124/70 | HR 66 | Temp 97.0°F | Ht 71.0 in | Wt 223.0 lb

## 2019-06-19 DIAGNOSIS — F17210 Nicotine dependence, cigarettes, uncomplicated: Secondary | ICD-10-CM

## 2019-06-19 DIAGNOSIS — I10 Essential (primary) hypertension: Secondary | ICD-10-CM | POA: Diagnosis not present

## 2019-06-19 DIAGNOSIS — I7121 Aneurysm of the ascending aorta, without rupture: Secondary | ICD-10-CM

## 2019-06-19 DIAGNOSIS — I712 Thoracic aortic aneurysm, without rupture: Secondary | ICD-10-CM | POA: Diagnosis not present

## 2019-06-19 DIAGNOSIS — I2699 Other pulmonary embolism without acute cor pulmonale: Secondary | ICD-10-CM

## 2019-06-19 DIAGNOSIS — I5032 Chronic diastolic (congestive) heart failure: Secondary | ICD-10-CM

## 2019-06-19 DIAGNOSIS — Z79899 Other long term (current) drug therapy: Secondary | ICD-10-CM | POA: Diagnosis not present

## 2019-06-19 DIAGNOSIS — I480 Paroxysmal atrial fibrillation: Secondary | ICD-10-CM

## 2019-06-19 DIAGNOSIS — E785 Hyperlipidemia, unspecified: Secondary | ICD-10-CM | POA: Diagnosis not present

## 2019-06-19 NOTE — Patient Instructions (Signed)
Medication Instructions:   CLARIFICATION: CARVEDILOL  6.25 MG ONE TABLET TWICE  A DAY  AMIODARONE  100 MG  DAILY ( 1/2 TABLET OF 200 MG )   INSTRUCTION IF YOU HAVE BREAKTHROUGH OF AFIB  TAKE 400 MG TWICE A DAY FOR 5 DAYS THEN TAKE 200 MG TWICE A DAY FOR 5 DAYS  THEN RESTART REGULAR DOSE  100 MG DAILY . CONTACT OFFICE IF YOU DO NOT GO BACK INTO REGULAR RHYTHM   *If you need a refill on your cardiac medications before your next appointment, please call your pharmacy*   Lab Work: NOT NEEDED   Testing/Procedures: NOT NEEDED   Follow-Up: At [facility], you and your health needs are our priority.  As part of our continuing mission to provide you with exceptional heart care, we have created designated Provider Care Teams.  These Care Teams include your primary Cardiologist (physician) and Advanced Practice Providers (APPs -  Physician Assistants and Nurse Practitioners) who all work together to provide you with the care you need, when you need it.    Your next appointment:   1 year(s) MARCH 2022  The format for your next appointment:   In Person  Provider:   Glenetta Hew, MD   Other Instructions  INSTRUCTION IF YOU HAVE BREAKTHROUGH OF AFIB  TAKE 400 MG TWICE A DAY FOR 5 DAYS THEN TAKE 200 MG TWICE A DAY FOR 5 DAYS  THEN RESTART REGULAR DOSE  100 MG DAILY . CONTACT OFFICE IF YOU DO NOT GO BACK INTO REGULAR RHYTHM

## 2019-06-19 NOTE — Progress Notes (Signed)
PCP: [name], MD  Clinic Note: Chief Complaint  Patient presents with  . Follow-up  . Atrial Fibrillation    HPI:    Ronald [name] is a 74 y.o. male with a PMH notable for paroxysmal persistent A. fib complicated by subarachnoid hemorrhage on anticoagulation and then pulmonary embolism with anticoagulation cessation.  He presents now for what amounts to be 62-monthfollow-up.  Ronald KOHLESwas last seen on October 7 for hospital follow-up (had subarachnoid hemorrhage-followed by pulmonary embolism related to cessation of Eliquis July-September 2020 in October 2020.  He was then seen by Dr. CCurt Bearsshortly thereafter.  --> Concern for drop in DLCO on amiodarone.  High-res CT scan did not seem to suggest any issues with amiodarone (pulmonologist did not seem to be concerned).  The discussion was to stay on amiodarone.  Also remains on Eliquis.  Recent Hospitalizations:   No further hospitalizations   Reviewed  CV studies:    Personally reviewed- (if available, images/films reviewed: From Epic Chart or Care Everywhere)  None    Interval History:    JBarnabas Listeris here today for follow-up stating that really he has been pretty stable since all of the issues in the summer and fall.  He unfortunately not been very active with all of the COVID-19 restrictions.  He is pretty much been sedentary at the house.  He does go out every now and then but not much.   Residual symptoms from his stroke are things like fine motor skills word processing as well as balance.  As part of the reason why he has not been as active.  He does note that he is little bit scared of doing much and is having hard time getting out of his comfort zone now.  As such, he is not as active, and has gained about 10 to 11 pounds.  He does say that it is difficult sometimes taking a deep breath and he can symptoms get a little short of breath when he is doing things but he certainly notes that he is has not  felt any A. fib.  He and his wife have purchased a Kardia monitor in order to evaluate for any recurrence of A. fib.  He has not documented any now.  He and his wife are little unsure of what dose he supposed to be on as far as amiodarone and carvedilol.  She thinks he may be doing it wrong.  He has been taking carvedilol twice daily but is taking half a dose of amiodarone as far she can tell.  Cardiovascular ROS: positive for - dyspnea on exertion and Some balance issues and dizziness, but no syncope or near syncope.  Exertional dyspnea is more because of deconditioning. negative for - chest pain, edema, irregular heartbeat, orthopnea, palpitations, paroxysmal nocturnal dyspnea, rapid heart rate, shortness of breath or Syncope/near syncope, TIA/amaurosis fugax; claudication   Reviewed Of Systems:    Review of Systems  Constitutional: Positive for malaise/fatigue (His energy level is better than it had been, but he is more now not doing things because of fear of getting out of his comfort zone.  He is trying to do some walking..Marland Kitchen. Negative for weight loss.  HENT: Negative for congestion and nosebleeds.   Eyes: Positive for blurred vision. Negative for double vision, photophobia and discharge.  Respiratory: Positive for cough (Chronic intermittent cough but no fevers or chills.). Negative for shortness of breath (Not really unless he tries to exert himself, but  is quite deconditioned.) and wheezing.   Cardiovascular: Positive for leg swelling (~Mild end of day swelling).  Gastrointestinal: Positive for heartburn (Depending upon what he eats). Negative for blood in stool, melena, nausea (Not really since his last hospitalization.) and vomiting.  Genitourinary: Negative for dysuria (He did have some burning when he urinated but it was probably related to Foley catheter.  Index hospitalization.  No UTI symptoms now.) and hematuria.  Musculoskeletal: Negative for back pain, falls, joint pain and myalgias.   Neurological: Positive for dizziness (Improved, but still unsteady gait.) and weakness (Improved overall generalized weakness.). Negative for speech change (Not really change in speech, just slowed speech) and focal weakness.  Psychiatric/Behavioral: Positive for memory loss (This is clearing up as well.). Negative for depression (More like dysthymia). The patient is not nervous/anxious and does not have insomnia.   All other systems reviewed and are negative.  /The patient does not have symptoms concerning for COVID-19 infection (fever, chills, cough, or new shortness of breath).  The patient is practicing social distancing.   COVID-19 Education: The signs and symptoms of COVID-19 were discussed with the patient and how to seek care for testing (follow up with PCP or arrange E-visit).   The importance of social distancing was discussed today.  I have reviewed and (if needed) personally updated the patient's problem list, medications, allergies, past medical and surgical history, social and family history.    PAST MEDICAL HISTORY   Past Medical History:  Diagnosis Date  . Allergic rhinitis   . Anticoagulant long-term use    eliquis  . Atrial fibrillation ([place])   . Cardiomyopathy due to systemic disease [facility])    followed by dr Katharine Rochefort  . COPD with emphysema [facility])    pulmologist-  dr Halford Chessman  . Dyspnea    occasional per pt  . Heart disease   . History of colon polyps    tubular adenoma 2013  . History of gout    09-05-2017 last flare-up  05/ 2019 3 wks ago, feet  . History of sepsis 02/18/2017   per d/c note probable uti, acute chf, acute renal failure, hypoxia  . History of YAG laser capsulotomy of lens December 2020, January 2021  . Hyperlipidemia   . Hyperplasia of prostate with lower urinary tract symptoms (LUTS)   . Hypertension   . OSA on CPAP    per study 08-03-2004  Severe OSA  . Persistent atrial fibrillation [facility])    cardiologist --  dr Dorris Carnes--  post  cardioversion 05-07-2014  . Prostate cancer [facility]) urologsit-  dr Alyson Ingles--- as of 05-21-2017 per pt last PSA 11 approx.   Dx  2014--  stage T1c, Gleason 3+3=6, PSA 6.67--  Active surveillance/  04/ 2019  Stage T1b, Gleason 3+4, PSA 12.8- plan external radiation therapy    . 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 ([place])    pulmologist-  dr Halford Chessman  . Seasonal allergies   . Sigmoid diverticulosis   . Systolic and diastolic CHF, chronic [facility])    cardiologist-  [name]  . Tinea versicolor   . Type 2 diabetes mellitus ([place])   . Wears hearing aid in both ears     PAST SURGICAL HISTORY    Past Surgical History:  Procedure Laterality Date  . CARDIOVERSION N/A 05/07/2014   Procedure: CARDIOVERSION;  Surgeon: [name], MD;  Location: [facility] ENDOSCOPY;  Service: Cardiovascular;  Laterality: N/A;  . CARDIOVERSION N/A 09/09/2014   Procedure: CARDIOVERSION;  Surgeon: Denice Bors  Stanford Breed, MD;  Location: [place];  Service: Cardiovascular;  Laterality: N/A;  successfully  . CATARACT EXTRACTION W/ INTRAOCULAR LENS  IMPLANT, BILATERAL  08 and 09/  2018  . COLONOSCOPY  last one 06-07-2011  . GOLD SEED IMPLANT N/A 09/09/2017   Procedure: GOLD SEED IMPLANT;  Surgeon: [name], MD;  [facility];  Service: Urology;  Laterality: N/A;  . HYDROCELE EXCISION Bilateral 09/26/2015   Procedure: HYDROCELECTOMY ADULT;  Surgeon: [name], MD;  Location[facility];  Service: Urology;  Laterality: Bilateral;  . LAMINECTOMY AND MICRODISCECTOMY LUMBAR SPINE  12-23-2003   Left L5 -- S1  . LAPAROSCOPIC BILATERAL INGUINAL HERNIA REPAIR/  UMBILICAL HERNIA REPAIR WITH MESH/  ASPIRATION LEFT HYDROCELE  07-11-2012  . NM MYOVIEW LTD  05/18/2014   Low risk study. Normal perfusion: No ischemia or infarction. Mild LV dysfunction - 46% (does not correlate with echocardiographic EF of 50-55%)  . PROSTATE BIOPSY  05/15/12   Clinically both [place]  .  SPACE OAR INSTILLATION N/A 09/09/2017   Procedure: SPACE OAR INSTILLATION;  Surgeon: [name], MD;  Location: [facility];  Service: Urology;  Laterality: N/A;  . TEE WITHOUT CARDIOVERSION N/A 05/07/2014   Procedure: TRANSESOPHAGEAL ECHOCARDIOGRAM (TEE);  Surgeon: [name], MD;  Location: [facility] ENDOSCOPY;  Service: Cardiovascular;  Laterality: N/A;   mild atherosclerosis plaque of aorta,  mild AR, MR, and TR,  no cardiac source of emboli  . TONSILLECTOMY  as child  . TRANSTHORACIC ECHOCARDIOGRAM  11/'18; 1/'19   a) In setting of sepsis: EF of 40-45%.  Diffuse hypokinesis.  No RWMA.  Biatrial enlargement.;; b) ** f/u Jan 2019: Normal LVF 55-60%** , mildly dilated aortic root(38 mm) and ascending aorta (44 mm). Compared to prior echo, LVEF has improved.  . TRANSTHORACIC ECHOCARDIOGRAM  7/'20; 9/'20   a) Hemorrhagic CVA/SAH - EF> 65%.  Moderate concentric LVH.-GR 1 DD mild RA dilation.; b) EF 60 to 65%.  Mild LVH.  GRII DD.  Normal RV size and function.  Mild LA dilation.  Normal RA size.  Unable to assess PA pressures.  No significant aortic sclerosis.  . TRANSURETHRAL RESECTION OF PROSTATE N/A 05/30/2017   Procedure: TRANSURETHRAL RESECTION OF THE PROSTATE (TURP);  Surgeon: [name], MD;  Location: [facility];  Service: Urology;  Laterality: N/A;    PFTs --> worsening DLCO, need to discuss with EP potential stopping amiodarone.  DLCO 13.3 (down from 15.8).  There is airway obstruction and diffusion defect suggesting emphysema there is no evidence of overinflation.  Suggest evaluation with exercise to evaluate presence of hypoxemia.  Reduced diffusing capacity indicates moderate to severe loss of functional alveolar service, however not corrected for hemoglobin..   Echo 01/04/2019: EF 60 to 65%.  Mild LVH.  GRII DD.  Normal RV size and function.  Mild LA dilation.  Normal RA size.  Unable to assess PA pressures.  No significant aortic sclerosis.  Echo July 2020: EF> 65%.   Moderate concentric LVH.-GR 1 DD mild RA dilation.   MEDICATIONS/ALLERGIES   Current Meds  Medication Sig  . albuterol (VENTOLIN HFA) 108 (90 Base) MCG/ACT inhaler Inhale 2 puffs into the lungs every 6 (six) hours as needed for wheezing or shortness of breath.  Marland Kitchen amiodarone (PACERONE) 200 MG tablet Take 0.5 tablets (100 mg total) by mouth daily.  Marland Kitchen apixaban (ELIQUIS) 5 MG TABS tablet Take 1 tablet (5 mg total) by mouth 2 (two) times daily.  Marland Kitchen  atorvastatin (LIPITOR) 10 MG tablet TAKE 1 TABLET BY MOUTH DAILY EACH EVENING  . azelastine (ASTELIN) 0.1 % nasal spray Place 1 spray into both nostrils 2 (two) times daily as needed for rhinitis or allergies.  . Blood Glucose Monitoring Suppl (ACCU-CHEK AVIVA PLUS) w/Device KIT Use blood glucose machine as instructed on your strips  . budesonide-formoterol (SYMBICORT) 160-4.5 MCG/ACT inhaler Inhale 2 puffs into the lungs 2 (two) times daily.  . carvedilol (COREG) 6.25 MG tablet Take 6.25 mg by mouth 2 (two) times daily with a meal.   . colchicine 0.6 MG tablet TAKE 1 TABLET (0.6 MG TOTAL) BY MOUTH 2 (TWO) TIMES DAILY AS NEEDED (GOUT FLARE). (Patient taking differently: Take 0.6 mg by mouth daily. )  . diclofenac sodium (VOLTAREN) 1 % GEL Apply 4 g topically 4 (four) times daily.  . divalproex (DEPAKOTE) 500 MG DR tablet TAKE 1 TABLET BY MOUTH EVERY 12 HOURS  . furosemide (LASIX) 40 MG tablet Take 1 tablet (40 mg total) by mouth 2 (two) times daily with breakfast and lunch.  . glimepiride (AMARYL) 2 MG tablet Take 1 tablet (2 mg total) by mouth daily with breakfast.  . glucose blood (ACCU-CHEK AVIVA PLUS) test strip Test 2 times daily dx e11.9  . HYDROcodone-acetaminophen (NORCO/VICODIN) 5-325 MG tablet Take 1 tablet by mouth every 6 (six) hours as needed for moderate pain.  . Lancets (ACCU-CHEK SOFT TOUCH) lancets Check blood sugars once per day. DX: E11.9  . metFORMIN (GLUCOPHAGE) 500 MG tablet TAKE 2 TABLETS (1,000 MG TOTAL) BY MOUTH 2 (TWO) TIMES  DAILY WITH A MEAL.  . montelukast (SINGULAIR) 10 MG tablet TAKE 1 TABLET BY MOUTH EVERYDAY AT BEDTIME  . MYRBETRIQ 25 MG TB24 tablet Take 25 mg by mouth daily.  Marland Kitchen PRESCRIPTION MEDICATION Inhale into the lungs at bedtime. CPAP  . QUEtiapine (SEROQUEL) 25 MG tablet TAKE 1 TABLET BY MOUTH EVERYDAY AT BEDTIME  . [DISCONTINUED] KLOR-CON M20 20 MEQ tablet TAKE 2 TABLETS BY MOUTH EVERY DAY    No Known Allergies   SOCIAL HISTORY/FAMILY HISTORY    Social History   Tobacco Use  . Smoking status: Current Every Day Smoker    Packs/day: 1.00    Years: 60.00    Pack years: 60.00    Types: Cigarettes  . Smokeless tobacco: Never Used  . Tobacco comment: 09-05-2017 tried quitting smoking 11/ 2018 but started back at 0.75ppd from 1ppd  Substance Use Topics  . Alcohol use: Not Currently  . Drug use: No   Social History   Social History Narrative   Married 25 years   2 children but not with this wife   Left handed    Likes to read, netflix, tv    family history includes Breast cancer in his mother; Ovarian cancer in his sister; Stroke in his mother and another family member.  Wt Readings from Last 3 Encounters:  06/19/19 223 lb (101.2 kg)  05/26/19 221 lb (100.2 kg)  05/19/19 220 lb 9.6 oz (100.1 kg)   OBJCTIVE -PE, EKG, labs   PHYSICAL EXAM BP 124/70 (BP Location: Right Arm, Patient Position: Sitting, Cuff Size: Normal)   Pulse 66   Temp (!) 97 F (36.1 C)   Ht '5\' 11"'  (1.803 m)   Wt 223 lb (101.2 kg)   BMI 31.10 kg/m  Physical Exam  Constitutional: He is oriented to person, place, and time. He appears well-developed and well-nourished. No distress.  Surprisingly healthy-appearing.  Well-groomed.  HENT:  Head: Normocephalic and atraumatic.  Neck: No hepatojugular reflux and no JVD present. Carotid bruit is not present.  Cardiovascular: Normal rate, regular rhythm, normal heart sounds and intact distal pulses. Exam reveals no gallop and no friction rub.  No murmur  heard. Pulmonary/Chest: Effort normal. No respiratory distress. He has no wheezes. He has no rales. He exhibits no tenderness.  Mildly diminished breath sounds in the bases, but otherwise normal.  Abdominal: Soft. He exhibits no distension. There is no abdominal tenderness. There is no rebound.  Somewhat hyperactive bowel sounds.  No obvious HSM.  Musculoskeletal:        General: Edema (Bilateral LE edema about 1-2+.) present. Normal range of motion.     Cervical back: Normal range of motion and neck supple.  Neurological: He is alert and oriented to person, place, and time.  Psychiatric: His behavior is normal. Thought content normal.  Mood and affect for the most part, but does seem a little bit down about all these hospital stays etc.  A little bit frustrated.  Vitals reviewed.    Adult ECG Report  Rate: 68 ;  Rhythm: normal sinus rhythm and Nonspecific ST and T wave changes.;  Subtle mild biphasic T waves/T wave inversions in anterior leads.  Narrative Interpretation: Relatively stable EKG  Other studies Reviewed: Additional studies/ records that were reviewed today include:  Recent Labs:   Lab Results  Component Value Date   CHOL 173 10/21/2018   HDL 35 (L) 10/21/2018   LDLCALC 77 10/21/2018   LDLDIRECT 69.0 04/22/2018   TRIG 306 (H) 10/21/2018   CHOLHDL 4.9 10/21/2018   Lab Results  Component Value Date   CREATININE 1.35 05/19/2019   BUN 18 05/19/2019   NA 142 05/19/2019   K 3.9 05/19/2019   CL 103 05/19/2019   CO2 32 05/19/2019   Lab Results  Component Value Date   CREATININE 1.35 05/19/2019   Lab Results  Component Value Date   TSH 2.92 05/19/2019   Lab Results  Component Value Date   WBC 5.6 01/09/2019   HGB 10.1 (L) 01/09/2019   HCT 30.2 (L) 01/09/2019   MCV 98.1 01/09/2019   PLT 338 01/09/2019    Lab Results  Component Value Date   HGBA1C 5.9 (A) 05/19/2019    CRP 1.4, ESR 16 (mildly elevated and upper limit of normal)  ASSESSMENT AND PLAN     Problem List Items Addressed This Visit    Paroxysmal atrial fibrillation ([place]) - CHA2DS2Vasc = 7; On Eliquis, Amiodarone - Primary (Chronic)    When he has A. fib, it tends to be persistent.  Very complicated situation where he is quite symptomatic while in A. fib and we are continuing to try to maintain sinus rhythm with amiodarone. Appreciate electrophysiology and pulmonary medicine input, despite drop in DLCO, we will continue amiodarone, I think we can probably continue 100 mg daily.  Remains on Eliquis for anticoagulation with no bleeding issues.  Plan for breakthrough A. Fib:  TAKE 400 MG TWICE A DAY FOR 5 DAYS THEN TAKE 200 MG TWICE A DAY FOR 5 DAYS  THEN RESTART REGULAR DOSE  100 MG DAILY . CONTACT OFFICE IF YOU DO NOT GO BACK INTO REGULAR RHYTHM        Relevant Orders   EKG 12-Lead   Chronic diastolic CHF (congestive heart failure) (HCC) (Chronic)    Exacerbated by A. fib.  One of the main reasons wanting to maintain sinus rhythm. He does have some edema, and is on standing dose of  Lasix.  I recommend support stockings and foot elevation to avoid having to diurese more than baseline dose of furosemide.      Hyperlipidemia with target LDL less than 70 (Chronic)    Lipids are relatively stable.  Would like to see it less than 70 based on stroke history.  Should be due for labs to be checked soon by PCP.  For now we will continue with low-dose atorvastatin.  He has not been elevated titrate further because of side effects. If next lipids are not at target, would probably consider adding Zetia, versus converting to rosuvastatin at 10-20 mg daily.      Essential hypertension (Chronic)    Blood pressure looks good today.  He is on twice daily carvedilol 6.25 mg.  Tolerating that relatively well.      On amiodarone therapy (Chronic)    Mildly elevated DLCO on PFTs but other labs look good. Has been cleared by pulmonary medicine and EP to continue amiodarone. Continuing  at 100 mg daily with suggestions to titrate higher for breakthrough.      Relevant Orders   EKG 12-Lead   Cigarette smoker two packs a day or less (Chronic)    He is down a significant amount of cigarettes, but not yet ready to quit.      Thoracic ascending aortic aneurysm (HCC) (Chronic)    Would be due to follow-up next year in annual follow-up      Acute pulmonary embolism ([place])    Unfortunately, this occurred when his anticoagulation was stopped because of subarachnoid hemorrhage.  That was in the setting of hypertensive emergency.  Argues the point of maintaining sinus rhythm and adequate blood pressure control.  Back on Eliquis now for combination of A. fib and PE         I spent a total of 28-30 minutes with the patient and chart review. >  50% of the time was spent in direct patient consultation.  I also spent an additional 20+ minutes on chart review, reviewing hospitalizations and recent studies.  Current medicines are reviewed at length with the patient today.  (+/- concerns) he is now quite worried about anticoagulation, fatigue etc. The following changes have been made:      Patient Instructions / Medication Changes & Studies & Tests Ordered   Patient Instructions  Medication Instructions:   CLARIFICATION: CARVEDILOL  6.25 MG ONE TABLET TWICE  A DAY  AMIODARONE  100 MG  DAILY ( 1/2 TABLET OF 200 MG )   INSTRUCTION IF YOU HAVE BREAKTHROUGH OF AFIB  TAKE 400 MG TWICE A DAY FOR 5 DAYS THEN TAKE 200 MG TWICE A DAY FOR 5 DAYS  THEN RESTART REGULAR DOSE  100 MG DAILY . CONTACT OFFICE IF YOU DO NOT GO BACK INTO REGULAR RHYTHM   *If you need a refill on your cardiac medications before your next appointment, please call your pharmacy*   Lab Work: NOT NEEDED   Testing/Procedures: NOT NEEDED   Follow-Up: At [facility], you and your health needs are our priority.  As part of our continuing mission to provide you with exceptional heart care, we have created  designated Provider Care Teams.  These Care Teams include your primary Cardiologist (physician) and Advanced Practice Providers (APPs -  Physician Assistants and Nurse Practitioners) who all work together to provide you with the care you need, when you need it.    Your next appointment:   1 year(s) MARCH 2022  The format for your  next appointment:   In Person  Provider:   Glenetta Hew, MD   Other Instructions  INSTRUCTION IF YOU HAVE BREAKTHROUGH OF AFIB  TAKE 400 MG TWICE A DAY FOR 5 DAYS THEN TAKE 200 MG TWICE A DAY FOR 5 DAYS  THEN RESTART REGULAR DOSE  100 MG DAILY . CONTACT OFFICE IF YOU DO NOT GO BACK INTO REGULAR RHYTHM    Orders Placed This Encounter  Procedures  . EKG 12-Lead      Glenetta Hew, M.D., M.S. Interventional Cardiologist   Pager # [phone] Phone # [phone] [address] [place], [place] [address]   Thank you for choosing Heartcare at [facility]!!

## 2019-06-21 ENCOUNTER — Encounter: Payer: Self-pay | Admitting: Cardiology

## 2019-06-21 NOTE — Assessment & Plan Note (Signed)
Mildly elevated DLCO on PFTs but other labs look good. Has been cleared by pulmonary medicine and EP to continue amiodarone. Continuing at 100 mg daily with suggestions to titrate higher for breakthrough.

## 2019-06-21 NOTE — Assessment & Plan Note (Signed)
Exacerbated by A. fib.  One of the main reasons wanting to maintain sinus rhythm. He does have some edema, and is on standing dose of Lasix.  I recommend support stockings and foot elevation to avoid having to diurese more than baseline dose of furosemide.

## 2019-06-21 NOTE — Assessment & Plan Note (Signed)
Blood pressure looks good today.  He is on twice daily carvedilol 6.25 mg.  Tolerating that relatively well.

## 2019-06-21 NOTE — Assessment & Plan Note (Signed)
Would be due to follow-up next year in annual follow-up

## 2019-06-21 NOTE — Assessment & Plan Note (Signed)
Lipids are relatively stable.  Would like to see it less than 70 based on stroke history.  Should be due for labs to be checked soon by PCP.  For now we will continue with low-dose atorvastatin.  He has not been elevated titrate further because of side effects. If next lipids are not at target, would probably consider adding Zetia, versus converting to rosuvastatin at 10-20 mg daily.

## 2019-06-21 NOTE — Assessment & Plan Note (Signed)
Unfortunately, this occurred when his anticoagulation was stopped because of subarachnoid hemorrhage.  That was in the setting of hypertensive emergency.  Argues the point of maintaining sinus rhythm and adequate blood pressure control.  Back on Eliquis now for combination of A. fib and PE

## 2019-06-21 NOTE — Assessment & Plan Note (Signed)
When he has A. fib, it tends to be persistent.  Very complicated situation where he is quite symptomatic while in A. fib and we are continuing to try to maintain sinus rhythm with amiodarone. Appreciate electrophysiology and pulmonary medicine input, despite drop in DLCO, we will continue amiodarone, I think we can probably continue 100 mg daily.  Remains on Eliquis for anticoagulation with no bleeding issues.  Plan for breakthrough A. Fib:  TAKE 400 MG TWICE A DAY FOR 5 DAYS THEN TAKE 200 MG TWICE A DAY FOR 5 DAYS  THEN RESTART REGULAR DOSE  100 MG DAILY . CONTACT OFFICE IF YOU DO NOT GO BACK INTO REGULAR RHYTHM

## 2019-06-21 NOTE — Assessment & Plan Note (Signed)
He is down a significant amount of cigarettes, but not yet ready to quit.

## 2019-07-07 ENCOUNTER — Encounter: Payer: Self-pay | Admitting: Neurology

## 2019-07-07 ENCOUNTER — Other Ambulatory Visit: Payer: Self-pay

## 2019-07-07 ENCOUNTER — Ambulatory Visit (INDEPENDENT_AMBULATORY_CARE_PROVIDER_SITE_OTHER): Payer: Medicare Other | Admitting: Neurology

## 2019-07-07 VITALS — BP 136/84 | HR 66 | Temp 97.7°F | Ht 71.0 in | Wt 218.5 lb

## 2019-07-07 DIAGNOSIS — Z5181 Encounter for therapeutic drug level monitoring: Secondary | ICD-10-CM | POA: Diagnosis not present

## 2019-07-07 DIAGNOSIS — E538 Deficiency of other specified B group vitamins: Secondary | ICD-10-CM

## 2019-07-07 NOTE — Progress Notes (Signed)
Reason for visit: Seizure, history of intracranial hemorrhage  Ronald [name] is an 74 y.o. male  History of present illness:  Ronald [name] is a 74 year old left-handed white male with a history of atrial fibrillation on anticoagulation, he unfortunately sustained a right frontal intracranial hemorrhage in the summer 2020.  The patient had a seizure in the hyperacute phase after the bleed.  He has had some mild changes in memory since that time, he has had some mild difficulty with balance, he has not had any falls.  He has not had any recurrent seizures on Depakote.  He had an event 2 months ago with transient numbness of the ulnar aspect of the left hand came on and lasted about 2 minutes and then cleared.  The patient has not had any overt seizure episodes.  The patient has had chronic left foot numbness since back surgery 20 years ago but more recently he has had some numbness as well on the right foot since the summer 2020.  The patient has not been very active since the stroke.  He has been eating and drinking fairly well.  His albumin levels have normalized.  Past Medical History:  Diagnosis Date  . Allergic rhinitis   . Anticoagulant long-term use    eliquis  . Atrial fibrillation ([place])   . Cardiomyopathy due to systemic disease [facility])    followed by dr harding  . COPD with emphysema [facility])    pulmologist-  dr Halford Chessman  . Dyspnea    occasional per pt  . Heart disease   . History of colon polyps    tubular adenoma 2013  . History of gout    09-05-2017 last flare-up  05/ 2019 3 wks ago, feet  . History of sepsis 02/18/2017   per d/c note probable uti, acute chf, acute renal failure, hypoxia  . History of YAG laser capsulotomy of lens December 2020, January 2021  . Hyperlipidemia   . Hyperplasia of prostate with lower urinary tract symptoms (LUTS)   . Hypertension   . OSA on CPAP    per study 08-03-2004  Severe OSA  . Persistent atrial fibrillation [facility])    cardiologist --   dr Dorris Carnes--  post cardioversion 05-07-2014  . Prostate cancer [facility]) urologsit-  dr Alyson Ingles--- as of 05-21-2017 per pt last PSA 11 approx.   Dx  2014--  stage T1c, Gleason 3+3=6, PSA 6.67--  Active surveillance/  04/ 2019  Stage T1b, Gleason 3+4, PSA 12.8- plan external radiation therapy    . 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 ([place])    pulmologist-  dr Halford Chessman  . Seasonal allergies   . Sigmoid diverticulosis   . Systolic and diastolic CHF, chronic [facility])    cardiologist-  [name]  . Tinea versicolor   . Type 2 diabetes mellitus ([place])   . Wears hearing aid in both ears     Past Surgical History:  Procedure Laterality Date  . CARDIOVERSION N/A 05/07/2014   Procedure: CARDIOVERSION;  Surgeon: [name], MD;  Location: [facility] ENDOSCOPY;  Service: Cardiovascular;  Laterality: N/A;  . CARDIOVERSION N/A 09/09/2014   Procedure: CARDIOVERSION;  Surgeon: Lelon Perla, MD;  Location: [facility] ENDOSCOPY;  Service: Cardiovascular;  Laterality: N/A;  successfully  . CATARACT EXTRACTION W/ INTRAOCULAR LENS  IMPLANT, BILATERAL  08 and 09/  2018  . COLONOSCOPY  last one 06-07-2011  . GOLD SEED IMPLANT N/A 09/09/2017   Procedure: GOLD SEED IMPLANT;  Surgeon: [name],  MD;  Location: [place];  Service: Urology;  Laterality: N/A;  . HYDROCELE EXCISION Bilateral 09/26/2015   Procedure: HYDROCELECTOMY ADULT;  Surgeon: [name], MD;  Location: [facility];  Service: Urology;  Laterality: Bilateral;  . LAMINECTOMY AND MICRODISCECTOMY LUMBAR SPINE  12-23-2003   Left L5 -- S1  . LAPAROSCOPIC BILATERAL INGUINAL HERNIA REPAIR/  UMBILICAL HERNIA REPAIR WITH MESH/  ASPIRATION LEFT HYDROCELE  07-11-2012  . NM MYOVIEW LTD  05/18/2014   Low risk study. Normal perfusion: No ischemia or infarction. Mild LV dysfunction - 46% (does not correlate with echocardiographic EF of 50-55%)  . PROSTATE BIOPSY  05/15/12   Clinically both Lobes  . SPACE OAR  INSTILLATION N/A 09/09/2017   Procedure: SPACE OAR INSTILLATION;  Surgeon: [name], MD;  Location: [facility];  Service: Urology;  Laterality: N/A;  . TEE WITHOUT CARDIOVERSION N/A 05/07/2014   Procedure: TRANSESOPHAGEAL ECHOCARDIOGRAM (TEE);  Surgeon: [name], MD;  Location: [facility] ENDOSCOPY;  Service: Cardiovascular;  Laterality: N/A;   mild atherosclerosis plaque of aorta,  mild AR, MR, and TR,  no cardiac source of emboli  . TONSILLECTOMY  as child  . TRANSTHORACIC ECHOCARDIOGRAM  11/'18; 1/'19   a) In setting of sepsis: EF of 40-45%.  Diffuse hypokinesis.  No RWMA.  Biatrial enlargement.;; b) ** f/u Jan 2019: Normal LVF 55-60%** , mildly dilated aortic root(38 mm) and ascending aorta (44 mm). Compared to prior echo, LVEF has improved.  . TRANSTHORACIC ECHOCARDIOGRAM  7/'20; 9/'20   a) Hemorrhagic CVA/SAH - EF> 65%.  Moderate concentric LVH.-GR 1 DD mild RA dilation.; b) EF 60 to 65%.  Mild LVH.  GRII DD.  Normal RV size and function.  Mild LA dilation.  Normal RA size.  Unable to assess PA pressures.  No significant aortic sclerosis.  . TRANSURETHRAL RESECTION OF PROSTATE N/A 05/30/2017   Procedure: TRANSURETHRAL RESECTION OF THE PROSTATE (TURP);  Surgeon: [name], MD;  Location: [facility];  Service: Urology;  Laterality: N/A;    Family History  Problem Relation Age of Onset  . Breast cancer Mother   . Stroke Mother   . Stroke Other        Unknown   . Ovarian cancer Sister   . Colon cancer Neg Hx   . Esophageal cancer Neg Hx   . Rectal cancer Neg Hx   . Stomach cancer Neg Hx     Social history:  reports that he has been smoking cigarettes. He has a 60.00 pack-year smoking history. He has never used smokeless tobacco. He reports previous alcohol use. He reports that he does not use drugs.   No Known Allergies  Medications:  Prior to Admission medications   Medication Sig Start Date End Date Taking? Authorizing Provider  albuterol (VENTOLIN HFA)  108 (90 Base) MCG/ACT inhaler Inhale 2 puffs into the lungs every 6 (six) hours as needed for wheezing or shortness of breath. 03/12/18  Yes Chesley Mires, MD  amiodarone (PACERONE) 200 MG tablet Take 0.5 tablets (100 mg total) by mouth daily. 01/14/19  Yes [place]ie Man, MD  apixaban (ELIQUIS) 5 MG TABS tablet Take 1 tablet (5 mg total) by mouth 2 (two) times daily. 05/13/19  Yes Burchette, Alinda Sierras, MD  atorvastatin (LIPITOR) 10 MG tablet TAKE 1 TABLET BY MOUTH DAILY EACH EVENING 01/16/19  Yes Burchette, Alinda Sierras, MD  azelastine (ASTELIN) 0.1 % nasal spray Place 1 spray into both nostrils 2 (two) times daily  as needed for rhinitis or allergies. 12/12/18  Yes Angiulli, Lavon Paganini, PA-C  Blood Glucose Monitoring Suppl (ACCU-CHEK AVIVA PLUS) w/Device KIT Use blood glucose machine as instructed on your strips 12/12/18  Yes Angiulli, Lavon Paganini, PA-C  budesonide-formoterol (SYMBICORT) 160-4.5 MCG/ACT inhaler Inhale 2 puffs into the lungs 2 (two) times daily. 12/12/18  Yes Angiulli, Lavon Paganini, PA-C  carvedilol (COREG) 6.25 MG tablet Take 6.25 mg by mouth 2 (two) times daily with a meal.    Yes [provider]  colchicine 0.6 MG tablet TAKE 1 TABLET (0.6 MG TOTAL) BY MOUTH 2 (TWO) TIMES DAILY AS NEEDED (GOUT FLARE). Patient taking differently: Take 0.6 mg by mouth daily.  04/14/19  Yes Burchette, Alinda Sierras, MD  diclofenac sodium (VOLTAREN) 1 % GEL Apply 4 g topically 4 (four) times daily. 12/12/18  Yes Angiulli, Lavon Paganini, PA-C  divalproex (DEPAKOTE) 500 MG DR tablet TAKE 1 TABLET BY MOUTH EVERY 12 HOURS 06/10/19  Yes Dohmeier, Asencion Partridge, MD  furosemide (LASIX) 40 MG tablet Take 1 tablet (40 mg total) by mouth 2 (two) times daily with breakfast and lunch. 01/16/19  Yes Burchette, Alinda Sierras, MD  glimepiride (AMARYL) 2 MG tablet Take 1 tablet (2 mg total) by mouth daily with breakfast. 01/16/19  Yes Burchette, Alinda Sierras, MD  glucose blood (ACCU-CHEK AVIVA PLUS) test strip Test 2 times daily dx e11.9 12/12/18  Yes Angiulli, Lavon Paganini,  PA-C  HYDROcodone-acetaminophen (NORCO/VICODIN) 5-325 MG tablet Take 1 tablet by mouth every 6 (six) hours as needed for moderate pain. 01/16/19  Yes Burchette, Alinda Sierras, MD  Lancets (ACCU-CHEK SOFT TOUCH) lancets Check blood sugars once per day. DX: E11.9 12/12/18  Yes Angiulli, Lavon Paganini, PA-C  metFORMIN (GLUCOPHAGE) 500 MG tablet TAKE 2 TABLETS (1,000 MG TOTAL) BY MOUTH 2 (TWO) TIMES DAILY WITH A MEAL. 04/14/19  Yes Burchette, Alinda Sierras, MD  montelukast (SINGULAIR) 10 MG tablet TAKE 1 TABLET BY MOUTH EVERYDAY AT BEDTIME 02/03/19  Yes Burchette, Alinda Sierras, MD  MYRBETRIQ 25 MG TB24 tablet Take 25 mg by mouth daily. 05/11/19  Yes [provider]  [place] into the lungs at bedtime. CPAP   Yes [provider]  QUEtiapine (SEROQUEL) 25 MG tablet TAKE 1 TABLET BY MOUTH EVERYDAY AT BEDTIME 04/14/19  Yes Burchette, Alinda Sierras, MD    ROS:  Out of a complete 14 system review of symptoms, the patient complains only of the following symptoms, and all other reviewed systems are negative.  Weight gain Mild balance problems Numbness in the feet History of seizure  Blood pressure 136/84, pulse 66, temperature 97.7 F (36.5 C), height '5\' 11"'  (1.803 m), weight 218 lb 8 oz (99.1 kg).  Physical Exam  General: The patient is alert and cooperative at the time of the examination.  The patient is moderately obese.  Skin: No significant peripheral edema is noted.   Neurologic Exam  Mental status: The patient is alert and oriented x 3 at the time of the examination. The patient has apparent normal recent and remote memory, with an apparently normal attention span and concentration ability.   Cranial nerves: Facial symmetry is present. Speech is normal, no aphasia or dysarthria is noted. Extraocular movements are full. Visual fields are full.  Motor: The patient has good strength in all 4 extremities.  Sensory examination: Soft touch sensation is symmetric on the face, arms, and  legs.  Coordination: The patient has good finger-nose-finger and heel-to-shin bilaterally.  Gait and station: The patient has a normal  gait. Tandem gait is slightly unsteady.  Romberg is negative. No drift is seen.  Reflexes: Deep tendon reflexes are symmetric.   CT head 01/08/19:  IMPRESSION: 1. Developing encephalomalacia/gliosis and likely persistent edema at site of a previous paramedian anterior right frontal lobe parenchymal hemorrhage. No evidence of acute intracranial hemorrhage. 2. Stable generalized atrophy and chronic small vessel ischemic Disease.  * CT scan images were reviewed online. I agree with the written report.    Assessment/Plan:  1.  History of right frontal intracranial hemorrhage  2.  Seizure secondary to #1  3.  Mild memory disturbance  4.  Mild gait disturbance  The patient will be sent for blood work today.  We will continue Depakote for now but we will consider gradual cessation of the medication in the summer 2021 if he continues to do well.  He will try to become more active, this may help his balance and stamina.  The wife indicates a change in personality since the stroke, and a mild change in memory.  He is doing all of his own activities of daily living, he is operating a motor vehicle at this time.  We will follow-up in 4 months.  Jill Alexanders MD 07/07/2019 10:49 AM  [facility] [address] [place], [place] [address]  Phone [phone] Fax [phone]

## 2019-07-09 LAB — COMPREHENSIVE METABOLIC PANEL
ALT: 12 IU/L (ref 0–44)
AST: 14 IU/L (ref 0–40)
Albumin/Globulin Ratio: 2.6 — ABNORMAL HIGH (ref 1.2–2.2)
Albumin: 4.4 g/dL (ref 3.7–4.7)
Alkaline Phosphatase: 73 IU/L (ref 39–117)
BUN/Creatinine Ratio: 12 (ref 10–24)
BUN: 18 mg/dL (ref 8–27)
Bilirubin Total: 0.6 mg/dL (ref 0.0–1.2)
CO2: 25 mmol/L (ref 20–29)
Calcium: 9 mg/dL (ref 8.6–10.2)
Chloride: 99 mmol/L (ref 96–106)
Creatinine, Ser: 1.5 mg/dL — ABNORMAL HIGH (ref 0.76–1.27)
GFR calc Af Amer: 52 mL/min/{1.73_m2} — ABNORMAL LOW (ref >59)
GFR calc non Af Amer: 45 mL/min/{1.73_m2} — ABNORMAL LOW (ref >59)
Globulin, Total: 1.7 g/dL (ref 1.5–4.5)
Glucose: 95 mg/dL (ref 65–99)
Potassium: 3.8 mmol/L (ref 3.5–5.2)
Sodium: 142 mmol/L (ref 134–144)
Total Protein: 6.1 g/dL (ref 6.0–8.5)

## 2019-07-09 LAB — CBC WITH DIFFERENTIAL/PLATELET
Basophils Absolute: 0 10*3/uL (ref 0.0–0.2)
Basos: 1 %
EOS (ABSOLUTE): 0.1 10*3/uL (ref 0.0–0.4)
Eos: 1 %
Hematocrit: 46.2 % (ref 37.5–51.0)
Hemoglobin: 15.9 g/dL (ref 13.0–17.7)
Immature Grans (Abs): 0 10*3/uL (ref 0.0–0.1)
Immature Granulocytes: 1 %
Lymphocytes Absolute: 1.5 10*3/uL (ref 0.7–3.1)
Lymphs: 22 %
MCH: 32.8 pg (ref 26.6–33.0)
MCHC: 34.4 g/dL (ref 31.5–35.7)
MCV: 95 fL (ref 79–97)
Monocytes Absolute: 0.7 10*3/uL (ref 0.1–0.9)
Monocytes: 10 %
Neutrophils Absolute: 4.5 10*3/uL (ref 1.4–7.0)
Neutrophils: 65 %
Platelets: 182 10*3/uL (ref 150–450)
RBC: 4.85 x10E6/uL (ref 4.14–5.80)
RDW: 14.3 % (ref 11.6–15.4)
WBC: 6.8 10*3/uL (ref 3.4–10.8)

## 2019-07-09 LAB — VITAMIN B12: Vitamin B-12: 421 pg/mL (ref 232–1245)

## 2019-07-09 LAB — VALPROIC ACID LEVEL: Valproic Acid Lvl: 67 ug/mL (ref 50–100)

## 2019-07-09 LAB — COPPER, SERUM: Copper: 89 ug/dL (ref 69–132)

## 2019-07-09 LAB — AMMONIA: Ammonia: 57 ug/dL (ref 31–169)

## 2019-07-13 ENCOUNTER — Other Ambulatory Visit: Payer: Self-pay

## 2019-07-13 ENCOUNTER — Other Ambulatory Visit: Payer: Self-pay | Admitting: Family Medicine

## 2019-07-14 ENCOUNTER — Encounter: Payer: Self-pay | Admitting: Family Medicine

## 2019-07-14 ENCOUNTER — Ambulatory Visit (INDEPENDENT_AMBULATORY_CARE_PROVIDER_SITE_OTHER): Payer: Medicare Other | Admitting: Family Medicine

## 2019-07-14 VITALS — BP 130/72 | HR 64 | Temp 97.8°F | Wt 223.9 lb

## 2019-07-14 DIAGNOSIS — N183 Chronic kidney disease, stage 3 unspecified: Secondary | ICD-10-CM | POA: Diagnosis not present

## 2019-07-14 DIAGNOSIS — E1165 Type 2 diabetes mellitus with hyperglycemia: Secondary | ICD-10-CM

## 2019-07-14 DIAGNOSIS — I10 Essential (primary) hypertension: Secondary | ICD-10-CM

## 2019-07-14 NOTE — Progress Notes (Signed)
Subjective:     Patient ID: Ronald [name], male   DOB: [birth year], 74 y.o.   MRN: [id number]  HPI Ronald [name] has multiple chronic problems including history of chronic diastolic heart failure, hypertension, A. fib, obstructive sleep apnea, type 2 diabetes, BPH, chronic kidney disease stage III and remote history of prostate cancer.  He is seen today to discuss some labs he had recently through neurology.  His creatinine was 1.50.  This is up from his baseline around 1.2-1.3.  He does take Lasix 40 mg twice daily.  He is not had any recent edema.  His weight is stable.  No recent increased dyspnea.  He is very sedentary.  He tries to avoid nonsteroidals.  He has occasional gout flareup and takes colchicine for that.  His diabetes been well controlled with recent A1c 5.9%.  Denies any obstructive urinary symptoms  Past Medical History:  Diagnosis Date  . Allergic rhinitis   . Anticoagulant long-term use    eliquis  . Atrial fibrillation ([place])   . Cardiomyopathy due to systemic disease [facility])    followed by dr harding  . COPD with emphysema [facility])    pulmologist-  dr Halford Chessman  . Dyspnea    occasional per pt  . Heart disease   . History of colon polyps    tubular adenoma 2013  . History of gout    09-05-2017 last flare-up  05/ 2019 3 wks ago, feet  . History of sepsis 02/18/2017   per d/c note probable uti, acute chf, acute renal failure, hypoxia  . History of YAG laser capsulotomy of lens December 2020, January 2021  . Hyperlipidemia   . Hyperplasia of prostate with lower urinary tract symptoms (LUTS)   . Hypertension   . OSA on CPAP    per study 08-03-2004  Severe OSA  . Persistent atrial fibrillation [facility])    cardiologist --  dr Dorris Carnes--  post cardioversion 05-07-2014  . Prostate cancer [facility]) urologsit-  dr Alyson Ingles--- as of 05-21-2017 per pt last PSA 11 approx.   Dx  2014--  stage T1c, Gleason 3+3=6, PSA 6.67--  Active surveillance/  04/ 2019  Stage T1b, Gleason 3+4, PSA 12.8-  plan external radiation therapy    . 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 Seasonal allergies   . Sigmoid diverticulosis   . Systolic and diastolic CHF, chronic [facility])    cardiologist-  [name]  . Tinea versicolor   . Type 2 diabetes mellitus ([place])   . Wears hearing aid in both ears    Past Surgical History:  Procedure Laterality Date  . CARDIOVERSION N/A 05/07/2014   Procedure: CARDIOVERSION;  Surgeon: [name], MD;  Location: [facility] ENDOSCOPY;  Service: Cardiovascular;  Laterality: N/A;  . CARDIOVERSION N/A 09/09/2014   Procedure: CARDIOVERSION;  Surgeon: Lelon Perla, MD;  Location: [facility] ENDOSCOPY;  Service: Cardiovascular;  Laterality: N/A;  successfully  . CATARACT EXTRACTION W/ INTRAOCULAR LENS  IMPLANT, BILATERAL  08 and 09/  2018  . COLONOSCOPY  last one 06-07-2011  . GOLD SEED IMPLANT N/A 09/09/2017   Procedure: GOLD SEED IMPLANT;  Surgeon: [name], MD;  Location: [facility];  Service: Urology;  Laterality: N/A;  . HYDROCELE EXCISION Bilateral 09/26/2015   Procedure: HYDROCELECTOMY ADULT;  Surgeon: [name], MD;  Location: [facility];  Service: Urology;  Laterality: Bilateral;  . LAMINECTOMY AND MICRODISCECTOMY LUMBAR SPINE  12-23-2003  Left L5 -- S1  . LAPAROSCOPIC BILATERAL INGUINAL HERNIA REPAIR/  UMBILICAL HERNIA REPAIR WITH MESH/  ASPIRATION LEFT HYDROCELE  07-11-2012  . NM MYOVIEW LTD  05/18/2014   Low risk study. Normal perfusion: No ischemia or infarction. Mild LV dysfunction - 46% (does not correlate with echocardiographic EF of 50-55%)  . PROSTATE BIOPSY  05/15/12   Clinically both Lobes  . SPACE OAR INSTILLATION N/A 09/09/2017   Procedure: SPACE OAR INSTILLATION;  Surgeon: [name], MD;  Location: [facility];  Service: Urology;  Laterality: N/A;  . TEE WITHOUT CARDIOVERSION N/A 05/07/2014   Procedure: TRANSESOPHAGEAL ECHOCARDIOGRAM  (TEE);  Surgeon: [name], MD;  Location: [facility] ENDOSCOPY;  Service: Cardiovascular;  Laterality: N/A;   mild atherosclerosis plaque of aorta,  mild AR, MR, and TR,  no cardiac source of emboli  . TONSILLECTOMY  as child  . TRANSTHORACIC ECHOCARDIOGRAM  11/'18; 1/'19   a) In setting of sepsis: EF of 40-45%.  Diffuse hypokinesis.  No RWMA.  Biatrial enlargement.;; b) ** f/u Jan 2019: Normal LVF 55-60%** , mildly dilated aortic root(38 mm) and ascending aorta (44 mm). Compared to prior echo, LVEF has improved.  . TRANSTHORACIC ECHOCARDIOGRAM  7/'20; 9/'20   a) Hemorrhagic CVA/SAH - EF> 65%.  Moderate concentric LVH.-GR 1 DD mild RA dilation.; b) EF 60 to 65%.  Mild LVH.  GRII DD.  Normal RV size and function.  Mild LA dilation.  Normal RA size.  Unable to assess PA pressures.  No significant aortic sclerosis.  . TRANSURETHRAL RESECTION OF PROSTATE N/A 05/30/2017   Procedure: TRANSURETHRAL RESECTION OF THE PROSTATE (TURP);  Surgeon: [name], MD;  Location: [facility];  Service: Urology;  Laterality: N/A;    reports that he has been smoking cigarettes. He has a 60.00 pack-year smoking history. He has never used smokeless tobacco. He reports previous alcohol use. He reports that he does not use drugs. family history includes Breast cancer in his mother; Ovarian cancer in his sister; Stroke in his mother and another family member. No Known Allergies   Review of Systems  Constitutional: Negative for chills and fatigue.  Eyes: Negative for visual disturbance.  Respiratory: Negative for cough, chest tightness and shortness of breath.   Cardiovascular: Negative for chest pain, palpitations and leg swelling.  Gastrointestinal: Negative for abdominal pain.  Endocrine: Negative for polydipsia and polyuria.  Genitourinary: Negative for difficulty urinating.  Neurological: Negative for dizziness, syncope, weakness, light-headedness and headaches.       Objective:   Physical Exam Vitals reviewed.   Constitutional:      Appearance: Normal appearance.  Cardiovascular:     Rate and Rhythm: Normal rate and regular rhythm.  Pulmonary:     Effort: Pulmonary effort is normal.     Breath sounds: Normal breath sounds.  Musculoskeletal:     Cervical back: Normal range of motion.     Right lower leg: No edema.     Left lower leg: No edema.  Neurological:     Mental Status: He is alert.        Assessment:     #1 history of chronic kidney disease.  He had recent mild [name] in creatinine which may be related to his Lasix therapy.  His peripheral edema is currently well controlled on furosemide 40 mg twice daily  #2 type 2 diabetes well controlled with recent A1c 5.9%  #3 hypertension stable and at goal    Plan:     -Reduce furosemide  to 40 mg once daily and follow-up for any recurrent edema.  We also suggest he try to do daily weights to make sure his not increasing weight  -We will plan future lab with basic metabolic panel in about 3 weeks after reduction of Lasix to see if his creatinine improves any  -He has scheduled office follow-up in August and recheck A1c then  [name] MD [facility]

## 2019-07-14 NOTE — Patient Instructions (Signed)
Reduce the Lasix to one daily and repeat kidney function in 3 weeks  Stay well hydrated.

## 2019-08-03 ENCOUNTER — Other Ambulatory Visit: Payer: Self-pay

## 2019-08-04 ENCOUNTER — Other Ambulatory Visit (INDEPENDENT_AMBULATORY_CARE_PROVIDER_SITE_OTHER): Payer: Medicare Other

## 2019-08-04 DIAGNOSIS — N183 Chronic kidney disease, stage 3 unspecified: Secondary | ICD-10-CM | POA: Diagnosis not present

## 2019-08-04 LAB — BASIC METABOLIC PANEL
BUN: 15 mg/dL (ref 6–23)
CO2: 33 mEq/L — ABNORMAL HIGH (ref 19–32)
Calcium: 8.8 mg/dL (ref 8.4–10.5)
Chloride: 96 mEq/L (ref 96–112)
Creatinine, Ser: 1.29 mg/dL (ref 0.40–1.50)
GFR: 54.42 mL/min — ABNORMAL LOW (ref >60.00)
Glucose, Bld: 106 mg/dL — ABNORMAL HIGH (ref 70–99)
Potassium: 3.9 mEq/L (ref 3.5–5.1)
Sodium: 135 mEq/L (ref 135–145)

## 2019-08-07 ENCOUNTER — Other Ambulatory Visit: Payer: Self-pay | Admitting: Family Medicine

## 2019-08-11 NOTE — Telephone Encounter (Signed)
His med list says he is on Coreg 6.25 mg bid.  He gets through cardiology and would clarify with them.  Would not refill the 12.5 mg unless verified by cardiology.

## 2019-08-12 NOTE — Telephone Encounter (Signed)
Message forwarded to cardiologist.

## 2019-08-18 NOTE — Telephone Encounter (Signed)
He has not been on 12.5 mg twice daily for a while.  He is on 6.25 mg twice daily please change the prescription to carvedilol 6.25 mg twice daily-90-day supply with 3 refills.  Glenetta Hew, MD

## 2019-08-18 NOTE — Telephone Encounter (Signed)
Please contact pharmacy and make sure that they know he is on 6.25 mg twice daily so we do not get any more request of 12.5 mg.  Make sure he has refills of 6.25 mg twice daily for the next year (ie #90 with 3 refills)

## 2019-08-18 NOTE — Telephone Encounter (Signed)
Please advise 

## 2019-08-24 ENCOUNTER — Other Ambulatory Visit: Payer: Self-pay | Admitting: Family Medicine

## 2019-08-27 ENCOUNTER — Other Ambulatory Visit: Payer: Self-pay | Admitting: Family Medicine

## 2019-08-31 ENCOUNTER — Telehealth: Payer: Self-pay | Admitting: Family Medicine

## 2019-08-31 NOTE — Telephone Encounter (Signed)
Transferred pt to the Nurse Triage because he was concerned with his blood pressure being so high.

## 2019-08-31 NOTE — Telephone Encounter (Signed)
Patient  spoke with nurse triage and reports  that he has an appt on Wednesday but said his BP has been going up. Patient said he has been monitoring his BP and adjusted a couple of his meds over the last 8 to 10 months and wants to know if he should wait for the appt or move it up. Patient said he is confused on the dosage

## 2019-08-31 NOTE — Telephone Encounter (Signed)
Pt states it was over 150on top and over 90 on bottom. Pt states he is not having any blurred vision , dizziness, headaches , or swelling. Please advise if he should up

## 2019-08-31 NOTE — Telephone Encounter (Signed)
Those numbers are too high, but I think we can wait until Wednesday to assess.

## 2019-09-01 ENCOUNTER — Other Ambulatory Visit: Payer: Self-pay

## 2019-09-01 NOTE — Telephone Encounter (Signed)
Pt has been told okay to wait to discuss Wednesday

## 2019-09-02 ENCOUNTER — Ambulatory Visit (INDEPENDENT_AMBULATORY_CARE_PROVIDER_SITE_OTHER): Payer: Medicare Other | Admitting: Family Medicine

## 2019-09-02 ENCOUNTER — Encounter: Payer: Self-pay | Admitting: Family Medicine

## 2019-09-02 VITALS — BP 140/88 | HR 76 | Temp 97.6°F | Wt 222.1 lb

## 2019-09-02 DIAGNOSIS — I10 Essential (primary) hypertension: Secondary | ICD-10-CM | POA: Diagnosis not present

## 2019-09-02 DIAGNOSIS — M1009 Idiopathic gout, multiple sites: Secondary | ICD-10-CM

## 2019-09-02 MED ORDER — LOSARTAN POTASSIUM 25 MG PO TABS: 25.0000 mg | ORAL_TABLET | Freq: Every day | ORAL | 5 refills | Status: DC

## 2019-09-02 NOTE — Progress Notes (Signed)
Subjective:     Patient ID: Ronald [name], male   DOB: [birth year], 74 y.o.   MRN: [id number]  HPI Ronald [name] is seen for medical follow-up.  He had called earlier in the week with elevated blood pressures with several readings at home Q000111Q systolic and 0000000 diastolic.  He had some mild headache recently but none today.  Denies any alcohol use.  Watch his sodium intake closely.  He had been on several medications for blood pressure previously including amlodipine and losartan.  He had sepsis over a year ago and his losartan was stopped at that time.  At 1 point he had some hypotension and amlodipine was held.  Current medications reviewed and include amiodarone, Eliquis, Lipitor, Coreg, Metformin, Myrbetriq  He has history of gout but no recent flareups.  Recent renal function late April stable with GFR 54 with creatinine 1.29.  Past Medical History:  Diagnosis Date  . Allergic rhinitis   . Anticoagulant long-term use    eliquis  . Atrial fibrillation ([place])   . Cardiomyopathy due to systemic disease [facility])    followed by dr harding  . COPD with emphysema [facility])    pulmologist-  dr Halford Chessman  . Dyspnea    occasional per pt  . Heart disease   . History of colon polyps    tubular adenoma 2013  . History of gout    09-05-2017 last flare-up  05/ 2019 3 wks ago, feet  . History of sepsis 02/18/2017   per d/c note probable uti, acute chf, acute renal failure, hypoxia  . History of YAG laser capsulotomy of lens December 2020, January 2021  . Hyperlipidemia   . Hyperplasia of prostate with lower urinary tract symptoms (LUTS)   . Hypertension   . OSA on CPAP    per study 08-03-2004  Severe OSA  . Persistent atrial fibrillation [facility])    cardiologist --  dr Dorris Carnes--  post cardioversion 05-07-2014  . Prostate cancer [facility]) urologsit-  dr Alyson Ingles--- as of 05-21-2017 per pt last PSA 11 approx.   Dx  2014--  stage T1c, Gleason 3+3=6, PSA 6.67--  Active surveillance/  04/ 2019  Stage T1b, Gleason 3+4,  PSA 12.8- plan external radiation therapy    . 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 ([place])    pulmologist-  dr Halford Chessman  . Seasonal allergies   . Sigmoid diverticulosis   . Systolic and diastolic CHF, chronic [facility])    cardiologist-  [name]  . Tinea versicolor   . Type 2 diabetes mellitus ([place])   . Wears hearing aid in both ears    Past Surgical History:  Procedure Laterality Date  . CARDIOVERSION N/A 05/07/2014   Procedure: CARDIOVERSION;  Surgeon: [name], MD;  Location: [facility] ENDOSCOPY;  Service: Cardiovascular;  Laterality: N/A;  . CARDIOVERSION N/A 09/09/2014   Procedure: CARDIOVERSION;  Surgeon: Lelon Perla, MD;  Location[facility] ENDOSCOPY;  Service: Cardiovascular;  Laterality: N/A;  successfully  . CATARACT EXTRACTION W/ INTRAOCULAR LENS  IMPLANT, BILATERAL  08 and 09/  2018  . COLONOSCOPY  last one 06-07-2011  . GOLD SEED IMPLANT N/A 09/09/2017   Procedure: GOLD SEED IMPLANT;  Surgeon: [name], MD;  Location: [facility];  Service: Urology;  Laterality: N/A;  . HYDROCELE EXCISION Bilateral 09/26/2015   Procedure: HYDROCELECTOMY ADULT;  Surgeon: [name], MD;  Location: [facility];  Service: Urology;  Laterality: Bilateral;  . LAMINECTOMY AND MICRODISCECTOMY LUMBAR SPINE  12-23-2003  Left L5 -- S1  . LAPAROSCOPIC BILATERAL INGUINAL HERNIA REPAIR/  UMBILICAL HERNIA REPAIR WITH MESH/  ASPIRATION LEFT HYDROCELE  07-11-2012  . NM MYOVIEW LTD  05/18/2014   Low risk study. Normal perfusion: No ischemia or infarction. Mild LV dysfunction - 46% (does not correlate with echocardiographic EF of 50-55%)  . PROSTATE BIOPSY  05/15/12   Clinically both Lobes  . SPACE OAR INSTILLATION N/A 09/09/2017   Procedure: SPACE OAR INSTILLATION;  Surgeon: [name], MD;  Location: [facility];  Service: Urology;  Laterality: N/A;  . TEE WITHOUT CARDIOVERSION N/A 05/07/2014   Procedure: TRANSESOPHAGEAL  ECHOCARDIOGRAM (TEE);  Surgeon: [name], MD;  Location: [facility] ENDOSCOPY;  Service: Cardiovascular;  Laterality: N/A;   mild atherosclerosis plaque of aorta,  mild AR, MR, and TR,  no cardiac source of emboli  . TONSILLECTOMY  as child  . TRANSTHORACIC ECHOCARDIOGRAM  11/'18; 1/'19   a) In setting of sepsis: EF of 40-45%.  Diffuse hypokinesis.  No RWMA.  Biatrial enlargement.;; b) ** f/u Jan 2019: Normal LVF 55-60%** , mildly dilated aortic root(38 mm) and ascending aorta (44 mm). Compared to prior echo, LVEF has improved.  . TRANSTHORACIC ECHOCARDIOGRAM  7/'20; 9/'20   a) Hemorrhagic CVA/SAH - EF> 65%.  Moderate concentric LVH.-GR 1 DD mild RA dilation.; b) EF 60 to 65%.  Mild LVH.  GRII DD.  Normal RV size and function.  Mild LA dilation.  Normal RA size.  Unable to assess PA pressures.  No significant aortic sclerosis.  . TRANSURETHRAL RESECTION OF PROSTATE N/A 05/30/2017   Procedure: TRANSURETHRAL RESECTION OF THE PROSTATE (TURP);  Surgeon: [name], MD;  Location: [facility];  Service: Urology;  Laterality: N/A;    reports that he has been smoking cigarettes. He has a 60.00 pack-year smoking history. He has never used smokeless tobacco. He reports previous alcohol use. He reports that he does not use drugs. family history includes Breast cancer in his mother; Ovarian cancer in his sister; Stroke in his mother and another family member. No Known Allergies   Review of Systems  Constitutional: Negative for fatigue and unexpected weight change.  Eyes: Negative for visual disturbance.  Respiratory: Negative for cough, chest tightness and shortness of breath.   Cardiovascular: Negative for chest pain, palpitations and leg swelling.  Endocrine: Negative for polydipsia and polyuria.  Neurological: Negative for dizziness, syncope, weakness and light-headedness.       Objective:   Physical Exam Vitals reviewed.  Constitutional:      Appearance: He is well-developed.  HENT:     Right  Ear: External ear normal.     Left Ear: External ear normal.  Eyes:     Pupils: Pupils are equal, round, and reactive to light.  Neck:     Thyroid: No thyromegaly.  Cardiovascular:     Rate and Rhythm: Normal rate and regular rhythm.  Pulmonary:     Effort: Pulmonary effort is normal. No respiratory distress.     Breath sounds: Normal breath sounds. No wheezing or rales.  Musculoskeletal:     Cervical back: Neck supple.     Right lower leg: No edema.     Left lower leg: No edema.  Neurological:     Mental Status: He is alert and oriented to person, place, and time.        Assessment:     #1 hypertension suboptimally controlled.  Repeat left arm seated by me 142/82.  Several home readings as above  Q000111Q systolic and 0000000 diastolic  #2 history of gout currently stable    Plan:     -Would avoid thiazides with his history of gout -Recommend starting back low-dose losartan 25 mg daily and reassess in 3 weeks and recheck basic metabolic panel then.  If not controlled better at that time either titrate losartan or addition of low-dose amlodipine  [name] MD  Primary Care at [facility]

## 2019-09-02 NOTE — Patient Instructions (Signed)
Start the Losartan 25 mg once daily  Let's plan on 3 week follow up and will check the kidney function.

## 2019-09-17 ENCOUNTER — Telehealth: Payer: Self-pay | Admitting: Neurology

## 2019-09-17 DIAGNOSIS — R3915 Urgency of urination: Secondary | ICD-10-CM | POA: Diagnosis not present

## 2019-09-17 DIAGNOSIS — C61 Malignant neoplasm of prostate: Secondary | ICD-10-CM | POA: Diagnosis not present

## 2019-09-17 DIAGNOSIS — G459 Transient cerebral ischemic attack, unspecified: Secondary | ICD-10-CM

## 2019-09-17 DIAGNOSIS — N401 Enlarged prostate with lower urinary tract symptoms: Secondary | ICD-10-CM | POA: Diagnosis not present

## 2019-09-17 NOTE — Telephone Encounter (Signed)
I called the patient.  The patient has had a couple recent episodes of numbness involving the left hand and one episode around the left side of the mouth lasting only a few seconds.  The patient has similar event in January 2021 involving the left hand.  The patient will require a work-up to exclude the possibility of a TIA.  We will get CT angiogram of the head and neck.

## 2019-09-17 NOTE — Addendum Note (Signed)
Addended by: Kathrynn Ducking on: 09/17/2019 05:02 PM   Modules accepted: Orders

## 2019-09-17 NOTE — Telephone Encounter (Signed)
Ronald [name] L(wife on [facility]) is asking for a call to discuss numbness and tingling pt has had in left arm and hand.  Wife told that if she thinks it is a stroke she should not wait for a call back but take pt to ED, she would still like a call from [employer]

## 2019-09-17 NOTE — Telephone Encounter (Addendum)
I called pts wife Katharine Look about her husband having numbness. The wife stated Ronald [name] had stroke in 2020. She stated on last Friday Ronald [name] reported to her  numbness and tingling in left hand and arm. It quickly resolve after 2 minutes. She also stated last night while Ronald [name] was smoking a cigarette If fell out of his hand and he reported numbness in his lip area and left arm and hand. The Ronald [name] is reporting no symptoms he is alert and oriented x4, no drooling or stroke like symptoms. I stated message will be sent to Dr .Jannifer Franklin to review. I stated if pts symptoms increase before she is call to please call 911 and Ronald [name] needs to be evaluated at the hospital.The wife verbalized understanding.

## 2019-09-18 ENCOUNTER — Telehealth: Payer: Self-pay | Admitting: Neurology

## 2019-09-18 NOTE — Telephone Encounter (Signed)
Medicare/bcbs fed order sent to GI. They will reach out to the patient to schedule.

## 2019-09-22 ENCOUNTER — Other Ambulatory Visit: Payer: Self-pay

## 2019-09-23 ENCOUNTER — Ambulatory Visit (INDEPENDENT_AMBULATORY_CARE_PROVIDER_SITE_OTHER): Payer: Medicare Other | Admitting: Family Medicine

## 2019-09-23 ENCOUNTER — Encounter: Payer: Self-pay | Admitting: Family Medicine

## 2019-09-23 VITALS — BP 132/78 | HR 55 | Temp 97.7°F | Wt 223.5 lb

## 2019-09-23 DIAGNOSIS — R200 Anesthesia of skin: Secondary | ICD-10-CM | POA: Diagnosis not present

## 2019-09-23 DIAGNOSIS — I10 Essential (primary) hypertension: Secondary | ICD-10-CM | POA: Diagnosis not present

## 2019-09-23 MED ORDER — LOSARTAN POTASSIUM 50 MG PO TABS
50.0000 mg | ORAL_TABLET | Freq: Every day | ORAL | 3 refills | Status: DC
Start: 2019-09-23 — End: 2020-01-21

## 2019-09-23 NOTE — Patient Instructions (Signed)
Increase the Losartan to 50 mg daily.  New prescription has been sent in.

## 2019-09-23 NOTE — Progress Notes (Signed)
Established Patient Office Visit  Subjective:  Patient ID: Ronald [name], male    DOB: 04-08-1946  Age: 74 y.o. MRN: [id number]  CC:  Chief Complaint  Patient presents with  . Follow-up    Pt is here for 3 week follow up and states BP is up and can not get down pt had left arm numbness and in left side of mouth is doing angio tomorrow      HPI Ronald [name] presents for follow-up of hypertension.  We had initiated losartan 25 mg daily few weeks ago.  By home readings he is consistently still getting around 673 systolic and 90 diastolic.  Since last visit he had a couple episodes where he had numbness involving left hand and the 1 episode of numbness left side of mouth only lasted a few seconds.  Dr. Jannifer Franklin, his neurologist,  had ordered CT angiogram of the head and neck and he has that scheduled tomorrow.  No symptoms during the past week.  Past Medical History:  Diagnosis Date  . Allergic rhinitis   . Anticoagulant long-term use    eliquis  . Atrial fibrillation ([place])   . Cardiomyopathy due to systemic disease [facility])    followed by dr harding  . COPD with emphysema [facility])    pulmologist-  dr Halford Chessman  . Dyspnea    occasional per pt  . Heart disease   . History of colon polyps    tubular adenoma 2013  . History of gout    09-05-2017 last flare-up  05/ 2019 3 wks ago, feet  . History of sepsis 02/18/2017   per d/c note probable uti, acute chf, acute renal failure, hypoxia  . History of YAG laser capsulotomy of lens December 2020, January 2021  . Hyperlipidemia   . Hyperplasia of prostate with lower urinary tract symptoms (LUTS)   . Hypertension   . OSA on CPAP    per study 08-03-2004  Severe OSA  . Persistent atrial fibrillation [facility])    cardiologist --  dr Dorris Carnes--  post cardioversion 05-07-2014  . Prostate cancer [facility]) urologsit-  dr Alyson Ingles--- as of 05-21-2017 per pt last PSA 11 approx.   Dx  2014--  stage T1c, Gleason 3+3=6, PSA 6.67--  Active surveillance/  04/  2019  Stage T1b, Gleason 3+4, PSA 12.8- plan external radiation therapy    . 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 Seasonal allergies   . Sigmoid diverticulosis   . Systolic and diastolic CHF, chronic [facility])    cardiologist-  [name]  . Tinea versicolor   . Type 2 diabetes mellitus ([place])   . Wears hearing aid in both ears     Past Surgical History:  Procedure Laterality Date  . CARDIOVERSION N/A 05/07/2014   Procedure: CARDIOVERSION;  Surgeon: [name], MD;  Location: [facility] ENDOSCOPY;  Service: Cardiovascular;  Laterality: N/A;  . CARDIOVERSION N/A 09/09/2014   Procedure: CARDIOVERSION;  Surgeon: Lelon Perla, MD;  Location: [facility] ENDOSCOPY;  Service: Cardiovascular;  Laterality: N/A;  successfully  . CATARACT EXTRACTION W/ INTRAOCULAR LENS  IMPLANT, BILATERAL  08 and 09/  2018  . COLONOSCOPY  last one 06-07-2011  . GOLD SEED IMPLANT N/A 09/09/2017   Procedure: GOLD SEED IMPLANT;  Surgeon: [name], MD;  Location: [facility];  Service: Urology;  Laterality: N/A;  . HYDROCELE EXCISION Bilateral 09/26/2015   Procedure: HYDROCELECTOMY ADULT;  Surgeon: Candee Furbish  McKenzie, MD;  Location: [facility];  Service: Urology;  Laterality: Bilateral;  . LAMINECTOMY AND MICRODISCECTOMY LUMBAR SPINE  12-23-2003   Left L5 -- S1  . LAPAROSCOPIC BILATERAL INGUINAL HERNIA REPAIR/  UMBILICAL HERNIA REPAIR WITH MESH/  ASPIRATION LEFT HYDROCELE  07-11-2012  . NM MYOVIEW LTD  05/18/2014   Low risk study. Normal perfusion: No ischemia or infarction. Mild LV dysfunction - 46% (does not correlate with echocardiographic EF of 50-55%)  . PROSTATE BIOPSY  05/15/12   Clinically both Lobes  . SPACE OAR INSTILLATION N/A 09/09/2017   Procedure: SPACE OAR INSTILLATION;  Surgeon: [name], MD;  Location: [facility];  Service: Urology;  Laterality: N/A;  . TEE WITHOUT CARDIOVERSION N/A 05/07/2014    Procedure: TRANSESOPHAGEAL ECHOCARDIOGRAM (TEE);  Surgeon: [name], MD;  Location: [facility] ENDOSCOPY;  Service: Cardiovascular;  Laterality: N/A;   mild atherosclerosis plaque of aorta,  mild AR, MR, and TR,  no cardiac source of emboli  . TONSILLECTOMY  as child  . TRANSTHORACIC ECHOCARDIOGRAM  11/'18; 1/'19   a) In setting of sepsis: EF of 40-45%.  Diffuse hypokinesis.  No RWMA.  Biatrial enlargement.;; b) ** f/u Jan 2019: Normal LVF 55-60%** , mildly dilated aortic root(38 mm) and ascending aorta (44 mm). Compared to prior echo, LVEF has improved.  . TRANSTHORACIC ECHOCARDIOGRAM  7/'20; 9/'20   a) Hemorrhagic CVA/SAH - EF> 65%.  Moderate concentric LVH.-GR 1 DD mild RA dilation.; b) EF 60 to 65%.  Mild LVH.  GRII DD.  Normal RV size and function.  Mild LA dilation.  Normal RA size.  Unable to assess PA pressures.  No significant aortic sclerosis.  . TRANSURETHRAL RESECTION OF PROSTATE N/A 05/30/2017   Procedure: TRANSURETHRAL RESECTION OF THE PROSTATE (TURP);  Surgeon: [name], MD;  [facility];  Service: Urology;  Laterality: N/A;    Family History  Problem Relation Age of Onset  . Breast cancer Mother   . Stroke Mother   . Stroke Other        Unknown   . Ovarian cancer Sister   . Colon cancer Neg Hx   . Esophageal cancer Neg Hx   . Rectal cancer Neg Hx   . Stomach cancer Neg Hx     Social History   Socioeconomic History  . Marital status: Married    Spouse name: Not on file  . Number of children: 2  . Years of education: 16  . Highest education level: Bachelor's degree (e.g., BA, AB, BS)  Occupational History  . Occupation: Scientist, clinical (histocompatibility and immunogenetics): [employer]  Tobacco Use  . Smoking status: Current Every Day Smoker    Packs/day: 1.00    Years: 60.00    Pack years: 60.00    Types: Cigarettes  . Smokeless tobacco: Never Used  . Tobacco comment: 09-05-2017 tried quitting smoking 11/ 2018 but started back at 0.75ppd from 1ppd  Vaping Use  . Vaping Use: Every  day  Substance and Sexual Activity  . Alcohol use: Not Currently  . Drug use: No  . Sexual activity: Not on file  Other Topics Concern  . Not on file  Social History Narrative   Married 25 years   2 children but not with this wife   Left handed    Likes to read, netflix, tv   Social Determinants of Health   Financial Resource Strain: Low Risk   . Difficulty of Paying Living Expenses: Not hard at all  Food Insecurity: No Food Insecurity  . Worried About Charity fundraiser in the Last Year: Never true  . Ran Out of Food in the Last Year: Never true  Transportation Needs: No Transportation Needs  . Lack of Transportation (Medical): No  . Lack of Transportation (Non-Medical): No  Physical Activity: Inactive  . Days of Exercise per Week: 0 days  . Minutes of Exercise per Session: 0 min  Stress: No Stress Concern Present  . Feeling of Stress : Only a little  Social Connections: Moderately Isolated  . Frequency of Communication with Friends and Family: More than three times a week  . Frequency of Social Gatherings with Friends and Family: Once a week  . Attends Religious Services: Never  . Active Member of Clubs or Organizations: No  . Attends Archivist Meetings: Never  . Marital Status: Married  Human resources officer Violence:   . Fear of Current or Ex-Partner:   . Emotionally Abused:   Marland Kitchen Physically Abused:   . Sexually Abused:     Outpatient Medications Prior to Visit  Medication Sig Dispense Refill  . albuterol (VENTOLIN HFA) 108 (90 Base) MCG/ACT inhaler Inhale 2 puffs into the lungs every 6 (six) hours as needed for wheezing or shortness of breath. 1 Inhaler 5  . amiodarone (PACERONE) 200 MG tablet Take 0.5 tablets (100 mg total) by mouth daily. 90 tablet 2  . apixaban (ELIQUIS) 5 MG TABS tablet Take 1 tablet (5 mg total) by mouth 2 (two) times daily. 180 tablet 1  . atorvastatin (LIPITOR) 10 MG tablet TAKE 1 TABLET BY MOUTH DAILY EACH EVENING 90 tablet 3  .  azelastine (ASTELIN) 0.1 % nasal spray Place 1 spray into both nostrils 2 (two) times daily as needed for rhinitis or allergies. 30 mL 12  . Blood Glucose Monitoring Suppl (ACCU-CHEK AVIVA PLUS) w/Device KIT Use blood glucose machine as instructed on your strips 1 kit 1  . budesonide-formoterol (SYMBICORT) 160-4.5 MCG/ACT inhaler Inhale 2 puffs into the lungs 2 (two) times daily. 1 Inhaler 12  . carvedilol (COREG) 6.25 MG tablet Take 6.25 mg by mouth 2 (two) times daily with a meal.     . colchicine 0.6 MG tablet TAKE 1 TABLET (0.6 MG TOTAL) BY MOUTH 2 (TWO) TIMES DAILY AS NEEDED (GOUT FLARE). 180 tablet 0  . diclofenac sodium (VOLTAREN) 1 % GEL Apply 4 g topically 4 (four) times daily. 4 g 1  . divalproex (DEPAKOTE) 500 MG DR tablet TAKE 1 TABLET BY MOUTH EVERY 12 HOURS 180 tablet 1  . furosemide (LASIX) 40 MG tablet TAKE 1 TABLET (40 MG TOTAL) BY MOUTH 2 (TWO) TIMES DAILY WITH BREAKFAST AND LUNCH. 180 tablet 1  . glimepiride (AMARYL) 2 MG tablet Take 1 tablet (2 mg total) by mouth daily with breakfast. 90 tablet 3  . glucose blood (ACCU-CHEK AVIVA PLUS) test strip Test 2 times daily dx e11.9 100 each 3  . HYDROcodone-acetaminophen (NORCO/VICODIN) 5-325 MG tablet Take 1 tablet by mouth every 6 (six) hours as needed for moderate pain. 20 tablet 0  . Lancets (ACCU-CHEK SOFT TOUCH) lancets Check blood sugars once per day. DX: E11.9 100 each 2  . metFORMIN (GLUCOPHAGE) 500 MG tablet TAKE 2 TABLETS (1,000 MG TOTAL) BY MOUTH 2 (TWO) TIMES DAILY WITH A MEAL. 360 tablet 1  . montelukast (SINGULAIR) 10 MG tablet TAKE 1 TABLET BY MOUTH EVERYDAY AT BEDTIME 90 tablet 1  . MYRBETRIQ 25 MG TB24 tablet Take 25 mg by mouth daily.    Marland Kitchen  PRESCRIPTION MEDICATION Inhale into the lungs at bedtime. CPAP    . QUEtiapine (SEROQUEL) 25 MG tablet TAKE 1 TABLET BY MOUTH EVERYDAY AT BEDTIME 90 tablet 0  . losartan (COZAAR) 25 MG tablet Take 1 tablet (25 mg total) by mouth daily. 30 tablet 5   No facility-administered  medications prior to visit.    No Known Allergies  ROS Review of Systems  Eyes: Negative for visual disturbance.  Respiratory: Negative for cough, chest tightness and shortness of breath.   Cardiovascular: Negative for chest pain, palpitations and leg swelling.  Neurological: Negative for dizziness, seizures, syncope, speech difficulty, weakness, light-headedness and headaches.  Psychiatric/Behavioral: Negative for confusion.      Objective:    Physical Exam Constitutional:      Appearance: He is well-developed.  HENT:     Right Ear: External ear normal.     Left Ear: External ear normal.  Eyes:     Pupils: Pupils are equal, round, and reactive to light.  Neck:     Thyroid: No thyromegaly.  Cardiovascular:     Rate and Rhythm: Normal rate and regular rhythm.  Pulmonary:     Effort: Pulmonary effort is normal. No respiratory distress.     Breath sounds: Normal breath sounds. No wheezing or rales.  Musculoskeletal:     Cervical back: Neck supple.     Right lower leg: No edema.     Left lower leg: No edema.  Neurological:     General: No focal deficit present.     Mental Status: He is alert and oriented to person, place, and time.     Cranial Nerves: No cranial nerve deficit.     Motor: No weakness.     Gait: Gait normal.  Psychiatric:        Mood and Affect: Mood normal.     BP 132/78 (BP Location: Left Arm, Cuff Size: Normal)   Pulse (!) 55   Temp 97.7 F (36.5 C) (Temporal)   Wt 223 lb 8 oz (101.4 kg)   SpO2 96%   BMI 31.17 kg/m  Wt Readings from Last 3 Encounters:  09/23/19 223 lb 8 oz (101.4 kg)  09/02/19 222 lb 1.6 oz (100.7 kg)  07/14/19 223 lb 14.4 oz (101.6 kg)     Health Maintenance Due  Topic Date Due  . Hepatitis C Screening  Never done  . COVID-19 Vaccine (1) Never done  . FOOT EXAM  12/05/2017  . COLONOSCOPY  09/06/2019    There are no preventive care reminders to display for this patient.  Lab Results  Component Value Date   TSH  2.92 05/19/2019   Lab Results  Component Value Date   WBC 6.8 07/07/2019   HGB 15.9 07/07/2019   HCT 46.2 07/07/2019   MCV 95 07/07/2019   PLT 182 07/07/2019   Lab Results  Component Value Date   NA 135 08/04/2019   K 3.9 08/04/2019   CO2 33 (H) 08/04/2019   GLUCOSE 106 (H) 08/04/2019   BUN 15 08/04/2019   CREATININE 1.29 08/04/2019   BILITOT 0.6 07/07/2019   ALKPHOS 73 07/07/2019   AST 14 07/07/2019   ALT 12 07/07/2019   PROT 6.1 07/07/2019   ALBUMIN 4.4 07/07/2019   CALCIUM 8.8 08/04/2019   ANIONGAP 7 01/09/2019   GFR 54.42 (L) 08/04/2019   Lab Results  Component Value Date   CHOL 173 10/21/2018   Lab Results  Component Value Date   HDL 35 (L) 10/21/2018  Lab Results  Component Value Date   LDLCALC 77 10/21/2018   Lab Results  Component Value Date   TRIG 306 (H) 10/21/2018   Lab Results  Component Value Date   CHOLHDL 4.9 10/21/2018   Lab Results  Component Value Date   HGBA1C 5.9 (A) 05/19/2019      Assessment & Plan:   #1  Hypertension improved from last visit by my reading.  I obtained reading of 132/78 seated.  He has had consistently high readings at home  -Suggested he bring his cuff to compare with ours next visit -Titrate losartan to 50 mg daily -He has scheduled follow-up in August and follow-up sooner as needed  #2 recent transient numbness left hand without associated clear weakness.  - CT angiogram head and neck pending. -Continue Eliquis.    Meds ordered this encounter  Medications  . losartan (COZAAR) 50 MG tablet    Sig: Take 1 tablet (50 mg total) by mouth daily.    Dispense:  90 tablet    Refill:  3    Follow-up: No follow-ups on file.    Carolann Littler, MD

## 2019-09-24 ENCOUNTER — Other Ambulatory Visit: Payer: Self-pay

## 2019-09-24 ENCOUNTER — Ambulatory Visit
Admission: RE | Admit: 2019-09-24 | Discharge: 2019-09-24 | Disposition: A | Discharge status: Home / Self Care | Payer: Medicare Other | Source: Ambulatory Visit | Attending: Neurology | Admitting: Neurology

## 2019-09-24 DIAGNOSIS — G459 Transient cerebral ischemic attack, unspecified: Secondary | ICD-10-CM

## 2019-09-24 MED ORDER — IOPAMIDOL (ISOVUE-370) INJECTION 76%
75.0000 mL | Freq: Once | INTRAVENOUS | Status: AC | PRN
  Administered 2019-09-24: 75 mL via INTRAVENOUS

## 2019-09-25 ENCOUNTER — Telehealth: Payer: Self-pay | Admitting: Neurology

## 2019-09-25 NOTE — Telephone Encounter (Signed)
I called the patient.  The CT angiogram of the head neck shows only mild atherosclerotic changes.  The patient may have a small vessel issue, he is on anticoagulant therapy.  He has hypertension and diabetes.  He recently had noted that his blood pressures were drifting into the 150 and 160 range, he has recently had adjustments to his blood pressure medications try to get his blood pressure down.   CTA head and neck 09/24/19:  IMPRESSION: 1. Mild atherosclerotic calcification of the carotid bifurcation bilaterally that stenosis. Previously noted web in the proximal right internal carotid artery has resolved and may have been a small dissection. 2. Mild stenosis proximal and distal right vertebral artery. Mild calcific stenosis distal left vertebral artery. 3. No intracranial stenosis or large vessel occlusion. Negative for vasculitis or aneurysm.

## 2019-10-07 ENCOUNTER — Other Ambulatory Visit: Payer: Medicare Other

## 2019-10-20 ENCOUNTER — Other Ambulatory Visit: Payer: Self-pay | Admitting: Family Medicine

## 2019-10-27 ENCOUNTER — Encounter: Payer: Self-pay | Admitting: Gastroenterology

## 2019-10-27 ENCOUNTER — Telehealth: Payer: Self-pay

## 2019-10-27 ENCOUNTER — Ambulatory Visit (INDEPENDENT_AMBULATORY_CARE_PROVIDER_SITE_OTHER): Payer: Medicare Other | Admitting: Gastroenterology

## 2019-10-27 VITALS — BP 128/78 | HR 64 | Ht 71.0 in | Wt 224.0 lb

## 2019-10-27 DIAGNOSIS — Z86010 Personal history of colonic polyps: Secondary | ICD-10-CM

## 2019-10-27 DIAGNOSIS — Z01818 Encounter for other preprocedural examination: Secondary | ICD-10-CM | POA: Diagnosis not present

## 2019-10-27 DIAGNOSIS — Z7901 Long term (current) use of anticoagulants: Secondary | ICD-10-CM

## 2019-10-27 MED ORDER — SUTAB 1479-225-188 MG PO TABS
1.0000 | ORAL_TABLET | ORAL | 0 refills | Status: DC
Start: 2019-10-27 — End: 2020-02-05

## 2019-10-27 NOTE — Telephone Encounter (Signed)
Patient with diagnosis of Atrial Fibrillation and PE on Eliquis for anticoagulation. PE occurred 12/2018 when anticoagulation was stopped due a subarachnoid hemorrhage in the setting of hypertensive emergency.   Procedure: Colonoscopy Date of procedure: 12/29/19  CHADS2-VASc score of 6 (CHF, HTN, DM, AGE, PE)  CrCl 72 ml/min Platelet count 182K  Per office protocol, patient can hold Eliquis for 1 day prior to procedure.    Patient should restart Eliquis as soon as safely possible, since patient has higher stroke risk when not on anticoagulation.

## 2019-10-27 NOTE — Telephone Encounter (Signed)
Pt will need to be contacted by cardiology closer to his surgery date  Which is 12/29/2019.  Pharmacy recommends only one day Eliquis hold pre op- resume as soon as possible post op.   Kerin Ransom PA-C 10/27/2019 1:31 PM

## 2019-10-27 NOTE — Telephone Encounter (Signed)
[facility] Pre-operative Risk Assessment     Request for surgical clearance:     Endoscopy Procedure  What type of surgery is being performed?     Colonoscopy  When is this surgery scheduled?     12/29/19  What type of clearance is required ?   Pharmacy  Are there any medications that need to be held prior to surgery and how long? Eliquis x 2 days  Practice name and name of physician performing surgery?      [facility]  What is your office phone and fax number?      Phone- [phone]  [phone]  Anesthesia type (None, local, MAC, general) ?       MAC

## 2019-10-27 NOTE — Patient Instructions (Signed)
You have been scheduled for a colonoscopy. Please follow written instructions given to you at your visit today.  Please pick up your prep supplies at the pharmacy within the next 1-3 days. If you use inhalers (even only as needed), please bring them with you on the day of your procedure.  Normal BMI (Body Mass Index- based on height and weight) is between 23 and 30. Your BMI today is Body mass index is 31.24 kg/m. Marland Kitchen Please consider follow up  regarding your BMI with your Primary Care Provider.  Thank you for choosing me and [facility].  Pricilla Riffle. Dagoberto Ligas., MD., Marval Regal

## 2019-10-27 NOTE — Progress Notes (Signed)
    History of Present Illness: This is a 74 year old male with a history of adenomatous colon polyps maintained on Eliquis.  He is accompanied by his wife.  Most recent colonoscopy as below. He is maintained on Eliquis for afib, history of CVA 1 year ago.  No gastrointestinal complaints. Denies weight loss, abdominal pain, constipation, diarrhea, change in stool caliber, melena, hematochezia, nausea, vomiting, dysphagia, reflux symptoms, chest pain.   Colonoscopy 08/2016 - Six 5 to 7 mm polyps in the rectum, in the sigmoid colon, in the descending colon and at the ileocecal valve, removed with a cold snare. Resected and retrieved. - Mild diverticulosis in the right colon. - Moderate diverticulosis in the left colon. - Internal hemorrhoids. - The examination was otherwise normal on direct and retroflexion views.  Current Medications, Allergies, Past Medical History, Past Surgical History, Family History and Social History were reviewed in [employer] record.   Physical Exam: General: Well developed, well nourished, no acute distress Head: Normocephalic and atraumatic Eyes:  sclerae anicteric, EOMI Ears: Normal auditory acuity Mouth: Not examined, mask on during Covid-19 pandemic Lungs: Clear throughout to auscultation Heart: Regular rate and rhythm; no murmurs, rubs or bruits Abdomen: Soft, non tender and non distended. No masses, hepatosplenomegaly or hernias noted. Normal Bowel sounds Rectal: Deferred to colonoscopy  Musculoskeletal: Symmetrical with no gross deformities  Pulses:  Normal pulses noted Extremities: No clubbing, cyanosis, edema or deformities noted Neurological: Alert oriented x 4, grossly nonfocal Psychological:  Alert and cooperative. Normal mood and affect   Assessment and Recommendations:  1.  Personal history of adenomatous colon polyps.  He is due for a 3-year interval surveillance colonoscopy.  Schedule colonoscopy. The risks (including  bleeding, perforation, infection, missed lesions, medication reactions and possible hospitalization or surgery if complications occur), benefits, and alternatives to colonoscopy with possible biopsy and possible polypectomy were discussed with the patient and they consent to proceed.   2. Afib, history of CVA. Hold Eliquis 2 days before procedure - will instruct when and how to resume after procedure. Low but real risk of cardiovascular event such as heart attack, stroke, embolism, thrombosis or ischemia/infarct of other organs off Eliquis explained and need to seek urgent help if this occurs. The patient consents to proceed. Will communicate by phone or EMR with patient's prescribing provider to confirm that holding Eliquis is reasonable in this case.

## 2019-10-28 ENCOUNTER — Telehealth: Payer: Self-pay | Admitting: Pulmonary Disease

## 2019-10-28 ENCOUNTER — Telehealth: Payer: Self-pay | Admitting: Family Medicine

## 2019-10-28 NOTE — Telephone Encounter (Signed)
He should be on auto CPAP 5 to 20 cm H2O.

## 2019-10-28 NOTE — Telephone Encounter (Signed)
Dr. Halford Chessman, patient requesting the CPAP settings for his machine. I have found 5-20 in [place], but 12 cm in another location. What are the correct settings? Please advise.

## 2019-10-28 NOTE — Telephone Encounter (Signed)
Pt stated we need to inform [place] of his CPAP settings which is  auto 5 to [place]

## 2019-10-28 NOTE — Telephone Encounter (Signed)
Called and spoke with patient letting him know the settings from Dr. Halford Chessman. Patient wrote it down and read it back to me. Stated that he would call and let them know and if he had any issues would call the office back. Nothing further needed at this time

## 2019-10-30 NOTE — Telephone Encounter (Signed)
Informed patient to hold Eliquis 1 day prior to his Colonoscopy per Cardiology. Patient verbalized understanding.

## 2019-10-30 NOTE — Telephone Encounter (Signed)
yes

## 2019-10-30 NOTE — Telephone Encounter (Signed)
Is one day hold ok Dr. Fuller Plan?

## 2019-11-02 NOTE — Telephone Encounter (Signed)
This has been faxed to [facility]

## 2019-11-06 ENCOUNTER — Other Ambulatory Visit: Payer: Self-pay | Admitting: Family Medicine

## 2019-11-11 ENCOUNTER — Telehealth: Payer: Self-pay | Admitting: Family Medicine

## 2019-11-11 NOTE — Telephone Encounter (Signed)
Refill for one year 

## 2019-11-11 NOTE — Telephone Encounter (Signed)
Please advise if okay to fill  

## 2019-11-11 NOTE — Telephone Encounter (Signed)
   carvedilol (COREG) 6.25 MG tablet  [id number] - [place], [place] - [address] AT [address] Phone:  [phone]  Fax:  [phone]

## 2019-11-13 MED ORDER — CARVEDILOL 6.25 MG PO TABS: 6.2500 mg | ORAL_TABLET | Freq: Two times a day (BID) | ORAL | 3 refills | Status: AC

## 2019-11-13 NOTE — Telephone Encounter (Signed)
Refill sent in

## 2019-11-13 NOTE — Addendum Note (Signed)
Addended by: Modena Morrow R on: 11/13/2019 06:48 AM   Modules accepted: Orders

## 2019-11-17 ENCOUNTER — Encounter: Payer: Self-pay | Admitting: Family Medicine

## 2019-11-17 ENCOUNTER — Ambulatory Visit (INDEPENDENT_AMBULATORY_CARE_PROVIDER_SITE_OTHER): Payer: Medicare Other | Admitting: Family Medicine

## 2019-11-17 ENCOUNTER — Other Ambulatory Visit: Payer: Self-pay

## 2019-11-17 VITALS — BP 118/78 | HR 75 | Temp 98.9°F | Wt 228.0 lb

## 2019-11-17 DIAGNOSIS — I10 Essential (primary) hypertension: Secondary | ICD-10-CM | POA: Diagnosis not present

## 2019-11-17 NOTE — Progress Notes (Signed)
Established Patient Office Visit  Subjective:  Patient ID: Ronald [name], male    DOB: 1945-08-08  Age: 74 y.o. MRN: [id number]  CC:  Chief Complaint  Patient presents with  . Follow-up    pt is here to follow up on how losartan is doing     HPI Ronald [name] presents for recheck of blood pressure.  He has history of pulmonary embolism, diastolic heart failure, hypertension, atrial fibrillation, obstructive sleep apnea, type 2 diabetes, chronic kidney disease.  He had some recent borderline elevated blood pressure readings and we increased his losartan from 25 to 50 mg daily.  Tolerating well with no side effects.  No dizziness.  Blood pressure readings been very stable.  Most recent A1c stable at 5.9%.  He does not monitor his sugars daily.  Past Medical History:  Diagnosis Date  . Allergic rhinitis   . Anticoagulant long-term use    eliquis  . Atrial fibrillation ([place])   . Cardiomyopathy due to systemic disease [facility])    followed by dr harding  . COPD with emphysema [facility])    pulmologist-  dr Halford Chessman  . Dyspnea    occasional per pt  . Heart disease   . History of colon polyps    tubular adenoma 2013  . History of gout    09-05-2017 last flare-up  05/ 2019 3 wks ago, feet  . History of sepsis 02/18/2017   per d/c note probable uti, acute chf, acute renal failure, hypoxia  . History of YAG laser capsulotomy of lens December 2020, January 2021  . Hyperlipidemia   . Hyperplasia of prostate with lower urinary tract symptoms (LUTS)   . Hypertension   . OSA on CPAP    per study 08-03-2004  Severe OSA  . Persistent atrial fibrillation [facility])    cardiologist --  dr Dorris Carnes--  post cardioversion 05-07-2014  . Prostate cancer [facility]) urologsit-  dr Alyson Ingles--- as of 05-21-2017 per pt last PSA 11 approx.   Dx  2014--  stage T1c, Gleason 3+3=6, PSA 6.67--  Active surveillance/  04/ 2019  Stage T1b, Gleason 3+4, PSA 12.8- plan external radiation therapy    . Respiratory  bronchiolitis associated interstitial lung disease ([place])    pulmologist-  dr Halford Chessman  . Seasonal allergies   . Sigmoid diverticulosis   . Systolic and diastolic CHF, chronic [facility])    cardiologist-  [name]  . Tinea versicolor   . Type 2 diabetes mellitus ([place])   . Wears hearing aid in both ears     Past Surgical History:  Procedure Laterality Date  . CARDIOVERSION N/A 05/07/2014   Procedure: CARDIOVERSION;  Surgeon: [name], MD;  Location: [facility] ENDOSCOPY;  Service: Cardiovascular;  Laterality: N/A;  . CARDIOVERSION N/A 09/09/2014   Procedure: CARDIOVERSION;  Surgeon: Lelon Perla, MD;  Location: [facility] ENDOSCOPY;  Service: Cardiovascular;  Laterality: N/A;  successfully  . CATARACT EXTRACTION W/ INTRAOCULAR LENS  IMPLANT, BILATERAL  08 and 09/  2018  . COLONOSCOPY  last one 06-07-2011  . GOLD SEED IMPLANT N/A 09/09/2017   Procedure: GOLD SEED IMPLANT;  Surgeon: [name], MD;  Location: [facility];  Service: Urology;  Laterality: N/A;  . HYDROCELE EXCISION Bilateral 09/26/2015   Procedure: HYDROCELECTOMY ADULT;  Surgeon: [name], MD;  Location: [facility];  Service: Urology;  Laterality: Bilateral;  . LAMINECTOMY AND MICRODISCECTOMY LUMBAR SPINE  12-23-2003   Left L5 -- S1  . LAPAROSCOPIC  BILATERAL INGUINAL HERNIA REPAIR/  UMBILICAL HERNIA REPAIR WITH MESH/  ASPIRATION LEFT HYDROCELE  07-11-2012  . NM MYOVIEW LTD  05/18/2014   Low risk study. Normal perfusion: No ischemia or infarction. Mild LV dysfunction - 46% (does not correlate with echocardiographic EF of 50-55%)  . PROSTATE BIOPSY  05/15/12   Clinically both [place]  . SPACE OAR INSTILLATION N/A 09/09/2017   Procedure: SPACE OAR INSTILLATION;  Surgeon: [name], MD;  Location: [facility];  Service: Urology;  Laterality: N/A;  . TEE WITHOUT CARDIOVERSION N/A 05/07/2014   Procedure: TRANSESOPHAGEAL ECHOCARDIOGRAM (TEE);  Surgeon: [name], MD;  Location: [facility]  ENDOSCOPY;  Service: Cardiovascular;  Laterality: N/A;   mild atherosclerosis plaque of aorta,  mild AR, MR, and TR,  no cardiac source of emboli  . TONSILLECTOMY  as child  . TRANSTHORACIC ECHOCARDIOGRAM  11/'18; 1/'19   a) In setting of sepsis: EF of 40-45%.  Diffuse hypokinesis.  No RWMA.  Biatrial enlargement.;; b) ** f/u Jan 2019: Normal LVF 55-60%** , mildly dilated aortic root(38 mm) and ascending aorta (44 mm). Compared to prior echo, LVEF has improved.  . TRANSTHORACIC ECHOCARDIOGRAM  7/'20; 9/'20   a) Hemorrhagic CVA/SAH - EF> 65%.  Moderate concentric LVH.-GR 1 DD mild RA dilation.; b) EF 60 to 65%.  Mild LVH.  GRII DD.  Normal RV size and function.  Mild LA dilation.  Normal RA size.  Unable to assess PA pressures.  No significant aortic sclerosis.  . TRANSURETHRAL RESECTION OF PROSTATE N/A 05/30/2017   Procedure: TRANSURETHRAL RESECTION OF THE PROSTATE (TURP);  Surgeon: [name], MD;  Location: [facility];  Service: Urology;  Laterality: N/A;    Family History  Problem Relation Age of Onset  . Breast cancer Mother   . Stroke Mother   . Stroke Other        Unknown   . Ovarian cancer Sister   . Colon cancer Neg Hx   . Esophageal cancer Neg Hx   . Rectal cancer Neg Hx   . Stomach cancer Neg Hx     Social History   Socioeconomic History  . Marital status: Married    Spouse name: Not on file  . Number of children: 2  . Years of education: 16  . Highest education level: Bachelor's degree (e.g., BA, AB, BS)  Occupational History  . Occupation: Scientist, clinical (histocompatibility and immunogenetics): [employer]  Tobacco Use  . Smoking status: Current Every Day Smoker    Packs/day: 1.00    Years: 60.00    Pack years: 60.00    Types: Cigarettes  . Smokeless tobacco: Never Used  . Tobacco comment: 09-05-2017 tried quitting smoking 11/ 2018 but started back at 0.75ppd from 1ppd  Vaping Use  . Vaping Use: Every day  Substance and Sexual Activity  . Alcohol use: Not Currently  . Drug use: No  .  Sexual activity: Not on file  Other Topics Concern  . Not on file  Social History Narrative   Married 25 years   2 children but not with this wife   Left handed    Likes to read, netflix, tv   Social Determinants of Health   Financial Resource Strain: Low Risk   . Difficulty of Paying Living Expenses: Not hard at all  Food Insecurity: No Food Insecurity  . Worried About Charity fundraiser in the Last Year: Never true  . Ran Out of Food in the Last Year: Never true  Transportation Needs: No Transportation Needs  . Lack of Transportation (Medical): No  . Lack of Transportation (Non-Medical): No  Physical Activity: Inactive  . Days of Exercise per Week: 0 days  . Minutes of Exercise per Session: 0 min  Stress: No Stress Concern Present  . Feeling of Stress : Only a little  Social Connections: Moderately Isolated  . Frequency of Communication with Friends and Family: More than three times a week  . Frequency of Social Gatherings with Friends and Family: Once a week  . Attends Religious Services: Never  . Active Member of Clubs or Organizations: No  . Attends Archivist Meetings: Never  . Marital Status: Married  Human resources officer Violence:   . Fear of Current or Ex-Partner:   . Emotionally Abused:   Marland Kitchen Physically Abused:   . Sexually Abused:     Outpatient Medications Prior to Visit  Medication Sig Dispense Refill  . albuterol (VENTOLIN HFA) 108 (90 Base) MCG/ACT inhaler Inhale 2 puffs into the lungs every 6 (six) hours as needed for wheezing or shortness of breath. 1 Inhaler 5  . amiodarone (PACERONE) 200 MG tablet Take 0.5 tablets (100 mg total) by mouth daily. 90 tablet 2  . atorvastatin (LIPITOR) 10 MG tablet TAKE 1 TABLET BY MOUTH DAILY EACH EVENING 90 tablet 3  . azelastine (ASTELIN) 0.1 % nasal spray Place 1 spray into both nostrils 2 (two) times daily as needed for rhinitis or allergies. 30 mL 12  . Blood Glucose Monitoring Suppl (ACCU-CHEK AVIVA PLUS)  w/Device KIT Use blood glucose machine as instructed on your strips 1 kit 1  . budesonide-formoterol (SYMBICORT) 160-4.5 MCG/ACT inhaler Inhale 2 puffs into the lungs 2 (two) times daily. 1 Inhaler 12  . carvedilol (COREG) 6.25 MG tablet Take 1 tablet (6.25 mg total) by mouth 2 (two) times daily with a meal. 180 tablet 3  . colchicine 0.6 MG tablet TAKE 1 TABLET (0.6 MG TOTAL) BY MOUTH 2 (TWO) TIMES DAILY AS NEEDED (GOUT FLARE). 180 tablet 0  . divalproex (DEPAKOTE) 500 MG DR tablet TAKE 1 TABLET BY MOUTH EVERY 12 HOURS 180 tablet 1  . ELIQUIS 5 MG TABS tablet TAKE 1 TABLET BY MOUTH TWICE A DAY 180 tablet 1  . furosemide (LASIX) 40 MG tablet TAKE 1 TABLET (40 MG TOTAL) BY MOUTH 2 (TWO) TIMES DAILY WITH BREAKFAST AND LUNCH. (Patient taking differently: Take 40 mg by mouth daily. ) 180 tablet 1  . glimepiride (AMARYL) 2 MG tablet Take 1 tablet (2 mg total) by mouth daily with breakfast. 90 tablet 3  . glucose blood (ACCU-CHEK AVIVA PLUS) test strip Test 2 times daily dx e11.9 100 each 3  . Lancets (ACCU-CHEK SOFT TOUCH) lancets Check blood sugars once per day. DX: E11.9 100 each 2  . losartan (COZAAR) 50 MG tablet Take 1 tablet (50 mg total) by mouth daily. 90 tablet 3  . metFORMIN (GLUCOPHAGE) 500 MG tablet TAKE 2 TABLETS (1,000 MG TOTAL) BY MOUTH 2 (TWO) TIMES DAILY WITH A MEAL. 360 tablet 1  . montelukast (SINGULAIR) 10 MG tablet TAKE 1 TABLET BY MOUTH EVERYDAY AT BEDTIME 90 tablet 1  . MYRBETRIQ 25 MG TB24 tablet Take 25 mg by mouth daily.    Marland Kitchen PRESCRIPTION MEDICATION Inhale into the lungs at bedtime. CPAP    . Sodium Sulfate-Mag Sulfate-KCl (SUTAB) [phone] MG TABS Take 1 kit by mouth as directed. BIN: [id number] PCN: CN GROUP: [id number] MEMBER ID: [id number];SWF AS CASH;NO PRIOR AUTHORIZATION 24 tablet 0  No facility-administered medications prior to visit.    No Known Allergies  ROS Review of Systems  Constitutional: Negative for chills and fever.  Respiratory: Negative for shortness  of breath.   Cardiovascular: Negative for chest pain and palpitations.  Neurological: Negative for dizziness and headaches.      Objective:    Physical Exam Vitals reviewed.  Cardiovascular:     Rate and Rhythm: Normal rate.  Pulmonary:     Effort: Pulmonary effort is normal.     Breath sounds: Normal breath sounds.  Musculoskeletal:     Right lower leg: No edema.     Left lower leg: No edema.  Neurological:     Mental Status: He is alert.     BP 118/78 (BP Location: Left Arm, Cuff Size: Normal)   Pulse 75   Temp 98.9 F (37.2 C) (Oral)   Wt 228 lb (103.4 kg)   SpO2 97%   BMI 31.80 kg/m  Wt Readings from Last 3 Encounters:  11/17/19 228 lb (103.4 kg)  10/27/19 224 lb (101.6 kg)  09/23/19 223 lb 8 oz (101.4 kg)     Health Maintenance Due  Topic Date Due  . Hepatitis C Screening  Never done  . COVID-19 Vaccine (1) Never done  . FOOT EXAM  12/05/2017  . COLONOSCOPY  09/06/2019  . INFLUENZA VACCINE  11/08/2019  . HEMOGLOBIN A1C  11/16/2019    There are no preventive care reminders to display for this patient.  Lab Results  Component Value Date   TSH 2.92 05/19/2019   Lab Results  Component Value Date   WBC 6.8 07/07/2019   HGB 15.9 07/07/2019   HCT 46.2 07/07/2019   MCV 95 07/07/2019   PLT 182 07/07/2019   Lab Results  Component Value Date   NA 135 08/04/2019   K 3.9 08/04/2019   CO2 33 (H) 08/04/2019   GLUCOSE 106 (H) 08/04/2019   BUN 15 08/04/2019   CREATININE 1.29 08/04/2019   BILITOT 0.6 07/07/2019   ALKPHOS 73 07/07/2019   AST 14 07/07/2019   ALT 12 07/07/2019   PROT 6.1 07/07/2019   ALBUMIN 4.4 07/07/2019   CALCIUM 8.8 08/04/2019   ANIONGAP 7 01/09/2019   GFR 54.42 (L) 08/04/2019   Lab Results  Component Value Date   CHOL 173 10/21/2018   Lab Results  Component Value Date   HDL 35 (L) 10/21/2018   Lab Results  Component Value Date   LDLCALC 77 10/21/2018   Lab Results  Component Value Date   TRIG 306 (H) 10/21/2018    Lab Results  Component Value Date   CHOLHDL 4.9 10/21/2018   Lab Results  Component Value Date   HGBA1C 5.9 (A) 05/19/2019      Assessment & Plan:   Hypertension improved with reading today 122/62 and I obtained very similar reading  -Continue current blood pressure medication regimen -Follow-up in 3 to 4 months.  Will recheck labs at that point including A1c, chemistries, lipids  No orders of the defined types were placed in this encounter.   Follow-up: Return in about 3 months (around 02/17/2020).    Carolann Littler, MD

## 2019-11-23 ENCOUNTER — Other Ambulatory Visit: Payer: Self-pay | Admitting: Family Medicine

## 2019-11-26 NOTE — Telephone Encounter (Signed)
   Primary Cardiologist: Glenetta Hew, MD  Chart reviewed as part of pre-operative protocol coverage. Patient has upcoming colonoscopy planned for 12/29/2019. We were asked to give our recommendations for holding Eliquis.   Per Pharmacy and office protocol, "patient can hold Eliquis for 1 day prior to procedure." This should be restarted as soon as safely possible since patient has higher stroke risk when not on anticoagulation.   I will route this recommendation to the requesting party via Epic fax function and remove from pre-op pool.  Please call with questions.  Darreld Mclean, PA-C 11/26/2019, 11:31 AM

## 2019-11-30 ENCOUNTER — Other Ambulatory Visit: Payer: Self-pay | Admitting: Neurology

## 2019-12-04 DIAGNOSIS — Z03818 Encounter for observation for suspected exposure to other biological agents ruled out: Secondary | ICD-10-CM | POA: Diagnosis not present

## 2019-12-04 DIAGNOSIS — Z20822 Contact with and (suspected) exposure to covid-19: Secondary | ICD-10-CM | POA: Diagnosis not present

## 2019-12-17 ENCOUNTER — Emergency Department (HOSPITAL_COMMUNITY): Payer: Medicare Other

## 2019-12-17 ENCOUNTER — Encounter (HOSPITAL_COMMUNITY): Payer: Self-pay | Admitting: Neurology

## 2019-12-17 ENCOUNTER — Inpatient Hospital Stay (HOSPITAL_COMMUNITY)
Admission: EM | Admit: 2019-12-17 | Discharge: 2019-12-23 | DRG: 064 | Disposition: A | Discharge status: Rehabilitation Facility | Payer: Medicare Other | Attending: Neurology | Admitting: Neurology

## 2019-12-17 ENCOUNTER — Other Ambulatory Visit: Payer: Self-pay

## 2019-12-17 DIAGNOSIS — R29703 NIHSS score 3: Secondary | ICD-10-CM | POA: Diagnosis present

## 2019-12-17 DIAGNOSIS — N1831 Chronic kidney disease, stage 3a: Secondary | ICD-10-CM | POA: Diagnosis present

## 2019-12-17 DIAGNOSIS — T45515A Adverse effect of anticoagulants, initial encounter: Secondary | ICD-10-CM | POA: Diagnosis present

## 2019-12-17 DIAGNOSIS — E1165 Type 2 diabetes mellitus with hyperglycemia: Secondary | ICD-10-CM | POA: Diagnosis present

## 2019-12-17 DIAGNOSIS — E876 Hypokalemia: Secondary | ICD-10-CM | POA: Diagnosis present

## 2019-12-17 DIAGNOSIS — I6340 Cerebral infarction due to embolism of unspecified cerebral artery: Secondary | ICD-10-CM | POA: Diagnosis not present

## 2019-12-17 DIAGNOSIS — G4733 Obstructive sleep apnea (adult) (pediatric): Secondary | ICD-10-CM | POA: Diagnosis not present

## 2019-12-17 DIAGNOSIS — J449 Chronic obstructive pulmonary disease, unspecified: Secondary | ICD-10-CM | POA: Diagnosis not present

## 2019-12-17 DIAGNOSIS — I129 Hypertensive chronic kidney disease with stage 1 through stage 4 chronic kidney disease, or unspecified chronic kidney disease: Secondary | ICD-10-CM | POA: Diagnosis not present

## 2019-12-17 DIAGNOSIS — Z720 Tobacco use: Secondary | ICD-10-CM | POA: Diagnosis present

## 2019-12-17 DIAGNOSIS — Z7401 Bed confinement status: Secondary | ICD-10-CM | POA: Diagnosis not present

## 2019-12-17 DIAGNOSIS — I10 Essential (primary) hypertension: Secondary | ICD-10-CM | POA: Diagnosis not present

## 2019-12-17 DIAGNOSIS — H53462 Homonymous bilateral field defects, left side: Secondary | ICD-10-CM | POA: Diagnosis present

## 2019-12-17 DIAGNOSIS — R0902 Hypoxemia: Secondary | ICD-10-CM | POA: Diagnosis not present

## 2019-12-17 DIAGNOSIS — N183 Chronic kidney disease, stage 3 unspecified: Secondary | ICD-10-CM | POA: Diagnosis present

## 2019-12-17 DIAGNOSIS — G9389 Other specified disorders of brain: Secondary | ICD-10-CM | POA: Diagnosis not present

## 2019-12-17 DIAGNOSIS — Z86711 Personal history of pulmonary embolism: Secondary | ICD-10-CM | POA: Diagnosis not present

## 2019-12-17 DIAGNOSIS — E1122 Type 2 diabetes mellitus with diabetic chronic kidney disease: Secondary | ICD-10-CM | POA: Diagnosis not present

## 2019-12-17 DIAGNOSIS — I700 Atherosclerosis of aorta: Secondary | ICD-10-CM | POA: Diagnosis present

## 2019-12-17 DIAGNOSIS — Z823 Family history of stroke: Secondary | ICD-10-CM

## 2019-12-17 DIAGNOSIS — Z20822 Contact with and (suspected) exposure to covid-19: Secondary | ICD-10-CM | POA: Diagnosis present

## 2019-12-17 DIAGNOSIS — I680 Cerebral amyloid angiopathy: Secondary | ICD-10-CM | POA: Diagnosis present

## 2019-12-17 DIAGNOSIS — R569 Unspecified convulsions: Secondary | ICD-10-CM | POA: Diagnosis present

## 2019-12-17 DIAGNOSIS — G459 Transient cerebral ischemic attack, unspecified: Secondary | ICD-10-CM | POA: Diagnosis not present

## 2019-12-17 DIAGNOSIS — I609 Nontraumatic subarachnoid hemorrhage, unspecified: Secondary | ICD-10-CM | POA: Diagnosis present

## 2019-12-17 DIAGNOSIS — H9190 Unspecified hearing loss, unspecified ear: Secondary | ICD-10-CM | POA: Diagnosis present

## 2019-12-17 DIAGNOSIS — Y929 Unspecified place or not applicable: Secondary | ICD-10-CM | POA: Diagnosis not present

## 2019-12-17 DIAGNOSIS — I6523 Occlusion and stenosis of bilateral carotid arteries: Secondary | ICD-10-CM | POA: Diagnosis not present

## 2019-12-17 DIAGNOSIS — Z974 Presence of external hearing-aid: Secondary | ICD-10-CM

## 2019-12-17 DIAGNOSIS — Z9079 Acquired absence of other genital organ(s): Secondary | ICD-10-CM

## 2019-12-17 DIAGNOSIS — I160 Hypertensive urgency: Secondary | ICD-10-CM | POA: Diagnosis present

## 2019-12-17 DIAGNOSIS — Z6833 Body mass index (BMI) 33.0-33.9, adult: Secondary | ICD-10-CM

## 2019-12-17 DIAGNOSIS — I130 Hypertensive heart and chronic kidney disease with heart failure and stage 1 through stage 4 chronic kidney disease, or unspecified chronic kidney disease: Secondary | ICD-10-CM | POA: Diagnosis present

## 2019-12-17 DIAGNOSIS — R29810 Facial weakness: Secondary | ICD-10-CM | POA: Diagnosis not present

## 2019-12-17 DIAGNOSIS — G936 Cerebral edema: Secondary | ICD-10-CM | POA: Diagnosis present

## 2019-12-17 DIAGNOSIS — I611 Nontraumatic intracerebral hemorrhage in hemisphere, cortical: Secondary | ICD-10-CM | POA: Diagnosis present

## 2019-12-17 DIAGNOSIS — R58 Hemorrhage, not elsewhere classified: Secondary | ICD-10-CM | POA: Diagnosis not present

## 2019-12-17 DIAGNOSIS — I619 Nontraumatic intracerebral hemorrhage, unspecified: Secondary | ICD-10-CM | POA: Diagnosis present

## 2019-12-17 DIAGNOSIS — I5042 Chronic combined systolic (congestive) and diastolic (congestive) heart failure: Secondary | ICD-10-CM | POA: Diagnosis present

## 2019-12-17 DIAGNOSIS — Z87898 Personal history of other specified conditions: Secondary | ICD-10-CM | POA: Diagnosis not present

## 2019-12-17 DIAGNOSIS — I6503 Occlusion and stenosis of bilateral vertebral arteries: Secondary | ICD-10-CM | POA: Diagnosis not present

## 2019-12-17 DIAGNOSIS — I616 Nontraumatic intracerebral hemorrhage, multiple localized: Secondary | ICD-10-CM | POA: Diagnosis not present

## 2019-12-17 DIAGNOSIS — I480 Paroxysmal atrial fibrillation: Secondary | ICD-10-CM | POA: Diagnosis present

## 2019-12-17 DIAGNOSIS — I639 Cerebral infarction, unspecified: Secondary | ICD-10-CM | POA: Diagnosis not present

## 2019-12-17 DIAGNOSIS — I517 Cardiomegaly: Secondary | ICD-10-CM | POA: Diagnosis not present

## 2019-12-17 DIAGNOSIS — E1151 Type 2 diabetes mellitus with diabetic peripheral angiopathy without gangrene: Secondary | ICD-10-CM | POA: Diagnosis present

## 2019-12-17 DIAGNOSIS — Z9989 Dependence on other enabling machines and devices: Secondary | ICD-10-CM | POA: Diagnosis not present

## 2019-12-17 DIAGNOSIS — J439 Emphysema, unspecified: Secondary | ICD-10-CM | POA: Diagnosis present

## 2019-12-17 DIAGNOSIS — I6389 Other cerebral infarction: Secondary | ICD-10-CM | POA: Diagnosis not present

## 2019-12-17 DIAGNOSIS — F17210 Nicotine dependence, cigarettes, uncomplicated: Secondary | ICD-10-CM | POA: Diagnosis present

## 2019-12-17 DIAGNOSIS — Z86718 Personal history of other venous thrombosis and embolism: Secondary | ICD-10-CM

## 2019-12-17 DIAGNOSIS — I612 Nontraumatic intracerebral hemorrhage in hemisphere, unspecified: Secondary | ICD-10-CM | POA: Diagnosis not present

## 2019-12-17 DIAGNOSIS — H5462 Unqualified visual loss, left eye, normal vision right eye: Secondary | ICD-10-CM | POA: Diagnosis present

## 2019-12-17 DIAGNOSIS — I69141 Monoplegia of lower limb following nontraumatic intracerebral hemorrhage affecting right dominant side: Secondary | ICD-10-CM | POA: Diagnosis not present

## 2019-12-17 DIAGNOSIS — Z9842 Cataract extraction status, left eye: Secondary | ICD-10-CM

## 2019-12-17 DIAGNOSIS — Z5329 Procedure and treatment not carried out because of patient's decision for other reasons: Secondary | ICD-10-CM | POA: Diagnosis not present

## 2019-12-17 DIAGNOSIS — Z8679 Personal history of other diseases of the circulatory system: Secondary | ICD-10-CM | POA: Diagnosis not present

## 2019-12-17 DIAGNOSIS — I618 Other nontraumatic intracerebral hemorrhage: Secondary | ICD-10-CM | POA: Diagnosis not present

## 2019-12-17 DIAGNOSIS — I739 Peripheral vascular disease, unspecified: Secondary | ICD-10-CM | POA: Diagnosis not present

## 2019-12-17 DIAGNOSIS — Z7901 Long term (current) use of anticoagulants: Secondary | ICD-10-CM

## 2019-12-17 DIAGNOSIS — Z8546 Personal history of malignant neoplasm of prostate: Secondary | ICD-10-CM

## 2019-12-17 DIAGNOSIS — E854 Organ-limited amyloidosis: Secondary | ICD-10-CM | POA: Diagnosis present

## 2019-12-17 DIAGNOSIS — M2550 Pain in unspecified joint: Secondary | ICD-10-CM | POA: Diagnosis not present

## 2019-12-17 DIAGNOSIS — E669 Obesity, unspecified: Secondary | ICD-10-CM | POA: Diagnosis present

## 2019-12-17 DIAGNOSIS — I63412 Cerebral infarction due to embolism of left middle cerebral artery: Secondary | ICD-10-CM | POA: Diagnosis not present

## 2019-12-17 DIAGNOSIS — E785 Hyperlipidemia, unspecified: Secondary | ICD-10-CM | POA: Diagnosis present

## 2019-12-17 DIAGNOSIS — G4489 Other headache syndrome: Secondary | ICD-10-CM | POA: Diagnosis not present

## 2019-12-17 DIAGNOSIS — Z803 Family history of malignant neoplasm of breast: Secondary | ICD-10-CM

## 2019-12-17 DIAGNOSIS — Z9841 Cataract extraction status, right eye: Secondary | ICD-10-CM

## 2019-12-17 DIAGNOSIS — I712 Thoracic aortic aneurysm, without rupture: Secondary | ICD-10-CM | POA: Diagnosis present

## 2019-12-17 DIAGNOSIS — G319 Degenerative disease of nervous system, unspecified: Secondary | ICD-10-CM | POA: Diagnosis not present

## 2019-12-17 DIAGNOSIS — Z79899 Other long term (current) drug therapy: Secondary | ICD-10-CM

## 2019-12-17 DIAGNOSIS — Z8719 Personal history of other diseases of the digestive system: Secondary | ICD-10-CM

## 2019-12-17 DIAGNOSIS — Z961 Presence of intraocular lens: Secondary | ICD-10-CM | POA: Diagnosis present

## 2019-12-17 LAB — RAPID URINE DRUG SCREEN, HOSP PERFORMED
Amphetamines: NOT DETECTED
Barbiturates: NOT DETECTED
Benzodiazepines: NOT DETECTED
Cocaine: NOT DETECTED
Opiates: NOT DETECTED
Tetrahydrocannabinol: NOT DETECTED

## 2019-12-17 LAB — COMPREHENSIVE METABOLIC PANEL
ALT: 14 U/L (ref 0–44)
AST: 17 U/L (ref 15–41)
Albumin: 3.6 g/dL (ref 3.5–5.0)
Alkaline Phosphatase: 56 U/L (ref 38–126)
Anion gap: 12 (ref 5–15)
BUN: 16 mg/dL (ref 8–23)
CO2: 25 mmol/L (ref 22–32)
Calcium: 8.9 mg/dL (ref 8.9–10.3)
Chloride: 101 mmol/L (ref 98–111)
Creatinine, Ser: 1.35 mg/dL — ABNORMAL HIGH (ref 0.61–1.24)
GFR calc Af Amer: 60 mL/min — ABNORMAL LOW (ref >60)
GFR calc non Af Amer: 51 mL/min — ABNORMAL LOW (ref >60)
Glucose, Bld: 118 mg/dL — ABNORMAL HIGH (ref 70–99)
Potassium: 3.7 mmol/L (ref 3.5–5.1)
Sodium: 138 mmol/L (ref 135–145)
Total Bilirubin: 0.8 mg/dL (ref 0.3–1.2)
Total Protein: 5.6 g/dL — ABNORMAL LOW (ref 6.5–8.1)

## 2019-12-17 LAB — CBC
HCT: 47.1 % (ref 39.0–52.0)
Hemoglobin: 15.6 g/dL (ref 13.0–17.0)
MCH: 31.9 pg (ref 26.0–34.0)
MCHC: 33.1 g/dL (ref 30.0–36.0)
MCV: 96.3 fL (ref 80.0–100.0)
Platelets: 205 10*3/uL (ref 150–400)
RBC: 4.89 MIL/uL (ref 4.22–5.81)
RDW: 13.7 % (ref 11.5–15.5)
WBC: 6.6 10*3/uL (ref 4.0–10.5)
nRBC: 0 % (ref 0.0–0.2)

## 2019-12-17 LAB — URINALYSIS, ROUTINE W REFLEX MICROSCOPIC
Bilirubin Urine: NEGATIVE
Glucose, UA: NEGATIVE mg/dL
Hgb urine dipstick: NEGATIVE
Ketones, ur: NEGATIVE mg/dL
Nitrite: POSITIVE — AB
Protein, ur: NEGATIVE mg/dL
Specific Gravity, Urine: 1.025 (ref 1.005–1.030)
pH: 7 (ref 5.0–8.0)

## 2019-12-17 LAB — ETHANOL: Alcohol, Ethyl (B): <10 mg/dL (ref <10)

## 2019-12-17 LAB — DIFFERENTIAL
Abs Immature Granulocytes: 0.04 10*3/uL (ref 0.00–0.07)
Basophils Absolute: 0.1 10*3/uL (ref 0.0–0.1)
Basophils Relative: 1 %
Eosinophils Absolute: 0.1 10*3/uL (ref 0.0–0.5)
Eosinophils Relative: 1 %
Immature Granulocytes: 1 %
Lymphocytes Relative: 23 %
Lymphs Abs: 1.5 10*3/uL (ref 0.7–4.0)
Monocytes Absolute: 0.6 10*3/uL (ref 0.1–1.0)
Monocytes Relative: 9 %
Neutro Abs: 4.3 10*3/uL (ref 1.7–7.7)
Neutrophils Relative %: 65 %

## 2019-12-17 LAB — SARS CORONAVIRUS 2 BY RT PCR (HOSPITAL ORDER, PERFORMED IN ~~LOC~~ HOSPITAL LAB): SARS Coronavirus 2: NEGATIVE

## 2019-12-17 LAB — APTT: aPTT: 26 seconds (ref 24–36)

## 2019-12-17 LAB — PROTIME-INR
INR: 1 (ref 0.8–1.2)
Prothrombin Time: 12.9 seconds (ref 11.4–15.2)

## 2019-12-17 LAB — CBG MONITORING, ED: Glucose-Capillary: 117 mg/dL — ABNORMAL HIGH (ref 70–99)

## 2019-12-17 LAB — I-STAT CHEM 8, ED
BUN: 19 mg/dL (ref 8–23)
Calcium, Ion: 1.08 mmol/L — ABNORMAL LOW (ref 1.15–1.40)
Chloride: 99 mmol/L (ref 98–111)
Creatinine, Ser: 1.4 mg/dL — ABNORMAL HIGH (ref 0.61–1.24)
Glucose, Bld: 115 mg/dL — ABNORMAL HIGH (ref 70–99)
HCT: 46 % (ref 39.0–52.0)
Hemoglobin: 15.6 g/dL (ref 13.0–17.0)
Potassium: 3.6 mmol/L (ref 3.5–5.1)
Sodium: 138 mmol/L (ref 135–145)
TCO2: 26 mmol/L (ref 22–32)

## 2019-12-17 MED ORDER — LABETALOL HCL 5 MG/ML IV SOLN: 10.0000 mg | Freq: Once | INTRAVENOUS | Status: AC

## 2019-12-17 MED ORDER — DIVALPROEX SODIUM 250 MG PO DR TAB
500.0000 mg | DELAYED_RELEASE_TABLET | Freq: Two times a day (BID) | ORAL | Status: DC
  Administered 2019-12-18 – 2019-12-23 (×12): 500 mg via ORAL
  Filled 2019-12-17: qty 2
  Filled 2019-12-17: qty 1
  Filled 2019-12-17: qty 2
  Filled 2019-12-17: qty 1
  Filled 2019-12-17 (×9): qty 2

## 2019-12-17 MED ORDER — LABETALOL HCL 5 MG/ML IV SOLN: 5.0000 mg | Freq: Once | INTRAVENOUS | Status: AC

## 2019-12-17 MED ORDER — MONTELUKAST SODIUM 10 MG PO TABS
10.0000 mg | ORAL_TABLET | Freq: Every day | ORAL | Status: DC
  Administered 2019-12-18 – 2019-12-22 (×6): 10 mg via ORAL
  Filled 2019-12-17 (×6): qty 1

## 2019-12-17 MED ORDER — LABETALOL HCL 5 MG/ML IV SOLN
INTRAVENOUS | Status: AC
  Administered 2019-12-17: 5 mg via INTRAVENOUS
  Filled 2019-12-17: qty 4

## 2019-12-17 MED ORDER — IOHEXOL 350 MG/ML SOLN
75.0000 mL | Freq: Once | INTRAVENOUS | Status: AC | PRN
  Administered 2019-12-17: 75 mL via INTRAVENOUS

## 2019-12-17 MED ORDER — LABETALOL HCL 5 MG/ML IV SOLN
INTRAVENOUS | Status: AC
  Administered 2019-12-17: 10 mg via INTRAVENOUS
  Filled 2019-12-17: qty 4

## 2019-12-17 MED ORDER — CARVEDILOL 6.25 MG PO TABS
6.2500 mg | ORAL_TABLET | Freq: Two times a day (BID) | ORAL | Status: DC
  Administered 2019-12-18 – 2019-12-23 (×12): 6.25 mg via ORAL
  Filled 2019-12-17 (×12): qty 1

## 2019-12-17 MED ORDER — NICARDIPINE HCL IN NACL 20-0.86 MG/200ML-% IV SOLN
3.0000 mg/h | INTRAVENOUS | Status: DC
  Administered 2019-12-17 – 2019-12-18 (×2): 5 mg/h via INTRAVENOUS
  Filled 2019-12-17 (×5): qty 200

## 2019-12-17 MED ORDER — PROTHROMBIN COMPLEX CONC HUMAN 500 UNITS IV KIT
4666.0000 [IU] | PACK | Status: AC
  Administered 2019-12-17: 4666 [IU] via INTRAVENOUS
  Filled 2019-12-17: qty 4666

## 2019-12-17 NOTE — H&P (Addendum)
Neurology Consultation Reason for Consult: Code stroke for left vision loss Referring Physician: Carmin Muskrat  CC: Left sided vision loss, headache, confusion   History is obtained from: Patient, wife and chart review   HPI: Ronald [name] is a 74 y.o. male with a past medical history significant for hypertension, hyperlipidemia, atrial fibrillation (currently on Eliquis for PE as well), COPD, left frontal hemorrhagic stroke (February 2020, secondary to Eliquis, mild residual right leg weakness, complicated by seizure now on Depakote), unprovoked pulmonary embolus (restarted on Eliquis in September 2020), and ongoing tobacco abuse  He reports that he was driving home when he started to have difficulty seeing out of the left side of his vision and he was additionally having a severe headache.  There was additionally perhaps some worsening of his chronic left leg weakness.  The headache improved but due to persistent vision issues and his wife noticing that he seemed confused, EMS was activated.  EMS reported an initial blood glucose of 112 (similar to obtained here), blood pressures of 160s over 110s, and saturating 92% on room air for which 4 L of nasal cannula started with improvement in his oxygenation.  ED course: Head CT revealed an acute intraparenchymal hemorrhage.  His last dose of apixaban was in the morning.  Given his overall clinical stability and ICH score of 0 he was not felt to be a candidate for Andexxa but was given Kcentra.  Blood pressure was refractory to IV labetalol and therefore nifedipine drip was started with good blood pressure control achieved.  His oxygen saturation did improve to the mid-high 90s on room air, though it dropped again intermittently while he was sleeping and not wearing his CPAP  LKW: 1300  tPA given?: No, due to on anticoagulation and hemorrhage on head CT Premorbid modified rankin scale:      1 - No significant disability. Able to carry out all  usual activities, despite some symptoms  ICH Score: 0  Time performed: 20:20  GCS: 13-15 is 0 points Infratentorial: No. If yes, 1 point Volume: <30cc is 0 points  Age: 75 y.o.. >80 is 1 point Intraventricular extension is 1 point A Score of 0 points has a 30 day mortality of 0%. Stroke. 2001 Apr;32(4):891-7.  ROS: A 14 point ROS was performed and is negative except as noted in the HPI.   Past Medical History:  Diagnosis Date  . Allergic rhinitis   . Anticoagulant long-term use    eliquis  . Atrial fibrillation ([place])   . Cardiomyopathy due to systemic disease [facility])    followed by dr harding  . COPD with emphysema [facility])    pulmologist-  dr Halford Chessman  . Dyspnea    occasional per pt  . Heart disease   . History of colon polyps    tubular adenoma 2013  . History of gout    09-05-2017 last flare-up  05/ 2019 3 wks ago, feet  . History of sepsis 02/18/2017   per d/c note probable uti, acute chf, acute renal failure, hypoxia  . History of YAG laser capsulotomy of lens December 2020, January 2021  . Hyperlipidemia   . Hyperplasia of prostate with lower urinary tract symptoms (LUTS)   . Hypertension   . OSA on CPAP    per study 08-03-2004  Severe OSA  . Persistent atrial fibrillation [facility])    cardiologist --  dr Dorris Carnes--  post cardioversion 05-07-2014  . Prostate cancer [facility]) urologsit-  dr Alyson Ingles--- as of 05-21-2017  per pt last PSA 11 approx.   Dx  2014--  stage T1c, Gleason 3+3=6, PSA 6.67--  Active surveillance/  04/ 2019  Stage T1b, Gleason 3+4, PSA 12.8- plan external radiation therapy    . 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 Seasonal allergies   . Sigmoid diverticulosis   . Systolic and diastolic CHF, chronic [facility])    cardiologist-  [name]  . Tinea versicolor   . Type 2 diabetes mellitus ([place])   . Wears hearing aid in both ears     Family History  Problem Relation Age of Onset  . Breast cancer Mother    . Stroke Mother   . Stroke Other        Unknown   . Ovarian cancer Sister   . Colon cancer Neg Hx   . Esophageal cancer Neg Hx   . Rectal cancer Neg Hx   . Stomach cancer Neg Hx     Social History:  reports that he has been smoking cigarettes. He has a 60.00 pack-year smoking history. He has never used smokeless tobacco. He reports previous alcohol use. He reports that he does not use drugs.   Exam: Current vital signs: Wt 102.5 kg   BMI 31.52 kg/m  Vital signs in last 24 hours: Weight:  [102.5 kg] 102.5 kg (09/09 2000)   Physical Exam  Constitutional: Appears well-developed and well-nourished. Obese  Psych: Affect appropriate to situation, somewhat flat  Eyes: No scleral injection HENT: No OP obstruction. Good dentition MSK: no joint deformities.  Cardiovascular: Irregularly irregular, JVP not elevated, negative hepatojuglar reflex Respiratory: Effort normal, non-labored breathing, distant breath sounds GI: Soft. There is no tenderness. Protuberant Skin: Healing scabs on bilateral hands, as well as left knee Extremities: Very mild edema of the bilateral lower legs   Neuro: Mental Status: Patient is awake, alert, oriented to person, place, month, year, and situation. Patient is able to give a clear and coherent history. Follows simple commands, requires repetition often felt to be secondary to his hearing loss No signs of aphasia or neglect. Cranial Nerves: II: Visual Fields with a dense left hemianopia. III,IV, VI: EOMI without ptosis or diploplia.  V: Facial sensation is symmetric to light touch VII: Facial movement is symmetric.  VIII: hearing is intact to voice (baseline hard of hearing) X: Uvula elevates symmetrically XI: Shoulder shrug is symmetric. XII: tongue is midline without atrophy or fasciculations.  Motor: Tone is normal. Bulk is normal. 5/5 strength was present in all four extremities, except for some mild 4+/5 right leg  weakness Sensory: Sensation is symmetric to light touch and temperature in the arms and legs. Deep Tendon Reflexes: 2+ and symmetric in the biceps and patellae.  Cerebellar: FNF and HKS are intact bilaterally      I have reviewed labs in epic and the results pertinent to this consultation are: Cr 1.4 (baseline 1.3 - 1.5) Platelets 205 INR 1 PT 12.9 APTT 26  Lab Results  Component Value Date   HGBA1C 5.9 (A) 05/19/2019    Lab Results  Component Value Date   CHOL 173 10/21/2018   HDL 35 (L) 10/21/2018   LDLCALC 77 10/21/2018   LDLDIRECT 69.0 04/22/2018   TRIG 306 (H) 10/21/2018   CHOLHDL 4.9 10/21/2018     I have reviewed the images obtained:  Initial head CT with intraparenchymal hemorrhage in the left occipital lobe 4-hour follow-up scan with overall stable hemorrhage CTA stable from  6/21   Impression: This is medically complex 74 year old gentleman with multiple strong indications for anticoagulation (atrial fibrillation, unprovoked pulmonary embolus), now presenting with a second intracranial hemorrhage most likely secondary to anticoagulation use.  He has been on therapeutic anticoagulation for nearly a year since his pulmonary embolus, and I suspect that those acute DVTs/PE have resolved, and I am reassured that his oxygen saturation is at his baseline when he is awake.  However should he have true hypoxia, recurrent PE should be considered.   Recommendations:  # Left occipital acute hemorrhage likely 2/2 Eliquis  - s/p reversal with Kcentra (Andexxa not administered given clinical stability and ICH score of 0) - Stroke labs HgbA1c, fasting lipid panel - MRI brain  - CTA completed as above  - Frequent neuro checks (q1hr) - Echocardiogram - Holding ASA secondary to hemorrhage - Risk factor modification; continued smoking cessation counseling  - Telemetry monitoring; - PT consult, OT consult, Speech consult - Admitted to stroke team, ICU status for frequent  neuro checks and high potential for rapid decline  # Seizure history > Single seizure in the setting of acute left frontal ICH in 07/2018 - Continue Depakote 500 mg BID   # A fib # Hypertension  # Heart failure with preserved EF  > Not volume overloaded on exam - BNP level pending - Holding AC as above  - Nifedipine drip for goal BP as above - Continue home Amiodarone 100 mg daily - Continuing home coreg 6.25 mg BID - Continuing home losartan 50 mg daily - Continuing home furosemide 40 mg daily   # COPD  - continuing home inhalers and montelukast  - baseline O2 saturations appear to be 94-97%  # Intermittent Hypoxia # OSA on CPAP  Not volume overloaded on exam; at this time favor OSA contribution  - COVID test negative  - CXR unrevealing  - Home CPAP - May need repeat CTA chest if hypoxia refractory to CPAP  # UA concerning for infection - Follow-up urine culture, holding on Abx for now  - Monitor fever curve   # History of hospital associated delirium  - Low threshold to start seroquel if QTc < 500 and Depakote as above insufficient   # History of DVT and PE -SCDs -Start prophylactic heparin at 24 hours if patient remains clinically stable  # Goals of care -Patient and wife confirmed that the patient would want to be FULL CODE STATUS, but would not wish to be on long-term life support in the setting of poor chance of functional recovery  -There will need to be ongoing discussions about risks and benefits of anticoagulation in the future; at this time anticoagulation definitely needs to be held  [place] [phone]  I spent 90 minutes in direct care of this patient today of which over 50% of the time was spent in face-to-face contact with the patient and family

## 2019-12-17 NOTE — ED Provider Notes (Signed)
[place] EMERGENCY DEPARTMENT Provider Note   CSN: [id number] Arrival date & time: 12/17/19  2001     History Chief Complaint  Patient presents with  . Code Stroke    Ronald [name] is a 74 y.o. male.  HPI    Patient presents as a code stroke. Patient's care coordinated with our neuro colleagues. Patient arrives via EMS and history is obtained by the patient, and EMS providers. Patient has history of prior stroke, intracranial hemorrhage, and some baseline deficits. Today, 6 hours prior to ED arrival was his last known normal time. Subsequently developed left-sided weakness, and left-sided visual deficiency. There is associated headache, though this has improved since onset, without any clear intervention. Patient denies overt confusion, and EMS reports that he was awake, alert, hemodynamically unremarkable in route.  Past Medical History:  Diagnosis Date  . Allergic rhinitis   . Anticoagulant long-term use    eliquis  . Atrial fibrillation ([place])   . Cardiomyopathy due to systemic disease [facility])    followed by dr harding  . COPD with emphysema [facility])    pulmologist-  dr Halford Chessman  . Dyspnea    occasional per pt  . Heart disease   . History of colon polyps    tubular adenoma 2013  . History of gout    09-05-2017 last flare-up  05/ 2019 3 wks ago, feet  . History of sepsis 02/18/2017   per d/c note probable uti, acute chf, acute renal failure, hypoxia  . History of YAG laser capsulotomy of lens December 2020, January 2021  . Hyperlipidemia   . Hyperplasia of prostate with lower urinary tract symptoms (LUTS)   . Hypertension   . OSA on CPAP    per study 08-03-2004  Severe OSA  . Persistent atrial fibrillation [facility])    cardiologist --  dr Dorris Carnes--  post cardioversion 05-07-2014  . Prostate cancer [facility]) urologsit-  dr Alyson Ingles--- as of 05-21-2017 per pt last PSA 11 approx.   Dx  2014--  stage T1c, Gleason 3+3=6, PSA 6.67--  Active surveillance/   04/ 2019  Stage T1b, Gleason 3+4, PSA 12.8- plan external radiation therapy    . 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 ([place])    pulmologist-  dr Halford Chessman  . Seasonal allergies   . Sigmoid diverticulosis   . Systolic and diastolic CHF, chronic [facility])    cardiologist-  [name]  . Tinea versicolor   . Type 2 diabetes mellitus ([place])   . Wears hearing aid in both ears     Patient Active Problem List   Diagnosis Date Noted  . Back pain 01/23/2019  . Lower extremity edema 01/15/2019  . Fatigue due to treatment 01/15/2019  . Acute pulmonary embolism ([place]) 01/04/2019  . Acute respiratory failure with hypoxia ([place]) 01/04/2019  . Pain   . AMS (altered mental status) 11/23/2018  . Acute encephalopathy 11/22/2018  . CKD (chronic kidney disease), stage III 11/22/2018  . Thoracic ascending aortic aneurysm ([place]) 11/22/2018  . Seizures ([place]), secondsry to [facility] 10/23/2018  . Coagulopathy ([place]), Eliquis 10/23/2018  . Cerebral edema ([place]) 10/23/2018  . Hypertensive emergency 10/23/2018  . Advanced age 64/16/2020  . ICH (intracerebral hemorrhage) ([place]) w/ SAH while on Eliquis 10/21/2018  . Hepatic steatosis 04/22/2018  . Actinic keratoses 06/07/2017  . BPH (benign prostatic hyperplasia) 05/30/2017  . Cardiomyopathy due to systemic disease ([place]) 03/09/2017  . Chronic diastolic CHF (congestive heart failure) ([place])   . History of colonic polyps  07/03/2016  . Chronic anticoagulation 07/03/2016  . Hyperglycemia, drug-induced 04/05/2015  . 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 On amiodarone therapy 12/08/2014  . Edema of both legs 12/08/2014  . Exertional dyspnea 08/18/2014  . Hypokalemia   . Tobacco abuse   . Paroxysmal atrial fibrillation (H[facility]) - CHA2DS2Vasc = 7; On Eliquis, Amiodarone 05/04/2014  . Cigarette smoker two packs a day or less   . Prostate cancer ([place]) 10/15/2013  . Obesity (BMI 30-39.9) 04/17/2013  . Umbilical  hernia 28/41/3244  . Right inguinal hernia 06/23/2012  . Hydrocele 06/19/2012  . Metabolic syndrome 04/11/7251  . Type 2 diabetes mellitus with hyperglycemia ([place]) 03/20/2012  . Elevated PSA 03/20/2012  . Gout, unspecified 10/07/2009  . TINEA VERSICOLOR 07/19/2009  . PERS HX TOBACCO USE PRESENTING HAZARDS HEALTH 07/19/2009  . Obstructive sleep apnea 07/02/2008  . Hyperlipidemia with target LDL less than 70 07/01/2008  . Essential hypertension 07/01/2008  . ALLERGIC RHINITIS 07/01/2008    Past Surgical History:  Procedure Laterality Date  . CARDIOVERSION N/A 05/07/2014   Procedure: CARDIOVERSION;  Surgeon: [name], MD;  Location: [facility] ENDOSCOPY;  Service: Cardiovascular;  Laterality: N/A;  . CARDIOVERSION N/A 09/09/2014   Procedure: CARDIOVERSION;  Surgeon: Lelon Perla, MD;  Location: [facility] ENDOSCOPY;  Service: Cardiovascular;  Laterality: N/A;  successfully  . CATARACT EXTRACTION W/ INTRAOCULAR LENS  IMPLANT, BILATERAL  08 and 09/  2018  . COLONOSCOPY  last one 06-07-2011  . GOLD SEED IMPLANT N/A 09/09/2017   Procedure: GOLD SEED IMPLANT;  Surgeon: [name], MD;  Location: [facility];  Service: Urology;  Laterality: N/A;  . HYDROCELE EXCISION Bilateral 09/26/2015   Procedure: HYDROCELECTOMY ADULT;  Surgeon: [name], MD;  Location: [facility];  Service: Urology;  Laterality: Bilateral;  . LAMINECTOMY AND MICRODISCECTOMY LUMBAR SPINE  12-23-2003   Left L5 -- S1  . LAPAROSCOPIC BILATERAL INGUINAL HERNIA REPAIR/  UMBILICAL HERNIA REPAIR WITH MESH/  ASPIRATION LEFT HYDROCELE  07-11-2012  . NM MYOVIEW LTD  05/18/2014   Low risk study. Normal perfusion: No ischemia or infarction. Mild LV dysfunction - 46% (does not correlate with echocardiographic EF of 50-55%)  . PROSTATE BIOPSY  05/15/12   Clinically both Lobes  . SPACE OAR INSTILLATION N/A 09/09/2017   Procedure: SPACE OAR INSTILLATION;  Surgeon: [name], MD;  Location:  [facility];  Service: Urology;  Laterality: N/A;  . TEE WITHOUT CARDIOVERSION N/A 05/07/2014   Procedure: TRANSESOPHAGEAL ECHOCARDIOGRAM (TEE);  Surgeon: [name], MD;  Location: [facility] ENDOSCOPY;  Service: Cardiovascular;  Laterality: N/A;   mild atherosclerosis plaque of aorta,  mild AR, MR, and TR,  no cardiac source of emboli  . TONSILLECTOMY  as child  . TRANSTHORACIC ECHOCARDIOGRAM  11/'18; 1/'19   a) In setting of sepsis: EF of 40-45%.  Diffuse hypokinesis.  No RWMA.  Biatrial enlargement.;; b) ** f/u Jan 2019: Normal LVF 55-60%** , mildly dilated aortic root(38 mm) and ascending aorta (44 mm). Compared to prior echo, LVEF has improved.  . TRANSTHORACIC ECHOCARDIOGRAM  7/'20; 9/'20   a) Hemorrhagic CVA/SAH - EF> 65%.  Moderate concentric LVH.-GR 1 DD mild RA dilation.; b) EF 60 to 65%.  Mild LVH.  GRII DD.  Normal RV size and function.  Mild LA dilation.  Normal RA size.  Unable to assess PA pressures.  No significant aortic sclerosis.  . TRANSURETHRAL RESECTION OF PROSTATE N/A 05/30/2017   Procedure: TRANSURETHRAL RESECTION OF  THE PROSTATE (TURP);  Surgeon: [name], MD;  Location: [facility];  Service: Urology;  Laterality: N/A;       Family History  Problem Relation Age of Onset  . Breast cancer Mother   . Stroke Mother   . Stroke Other        Unknown   . Ovarian cancer Sister   . Colon cancer Neg Hx   . Esophageal cancer Neg Hx   . Rectal cancer Neg Hx   . Stomach cancer Neg Hx     Social History   Tobacco Use  . Smoking status: Current Every Day Smoker    Packs/day: 1.00    Years: 60.00    Pack years: 60.00    Types: Cigarettes  . Smokeless tobacco: Never Used  . Tobacco comment: 09-05-2017 tried quitting smoking 11/ 2018 but started back at 0.75ppd from 1ppd  Vaping Use  . Vaping Use: Every day  Substance Use Topics  . Alcohol use: Not Currently  . Drug use: No    Home Medications Prior to Admission medications   Medication Sig Start  Date End Date Taking? Authorizing Provider  albuterol (VENTOLIN HFA) 108 (90 Base) MCG/ACT inhaler Inhale 2 puffs into the lungs every 6 (six) hours as needed for wheezing or shortness of breath. 03/12/18  Yes Chesley Mires, MD  amiodarone (PACERONE) 200 MG tablet Take 0.5 tablets (100 mg total) by mouth daily. 01/14/19  Yes Leonie Man, MD  ascorbic acid (VITAMIN C) 500 MG tablet Take 500 mg by mouth daily.   Yes [provider]  atorvastatin (LIPITOR) 10 MG tablet TAKE 1 TABLET BY MOUTH DAILY EACH EVENING Patient taking differently: Take 10 mg by mouth at bedtime.  01/16/19  Yes Burchette, Alinda Sierras, MD  carvedilol (COREG) 6.25 MG tablet Take 1 tablet (6.25 mg total) by mouth 2 (two) times daily with a meal. 11/13/19  Yes Burchette, Alinda Sierras, MD  Cholecalciferol (VITAMIN D-3 PO) Take 1 capsule by mouth daily.   Yes [provider]  colchicine 0.6 MG tablet TAKE 1 TABLET (0.6 MG TOTAL) BY MOUTH 2 (TWO) TIMES DAILY AS NEEDED (GOUT FLARE). Patient taking differently: Take 0.6 mg by mouth See admin instructions. Take 0.6 mg by mouth once a day and an additional 0.6 mg once daily as needed/AS DIRECTED for gout flares 11/24/19  Yes Burchette, Alinda Sierras, MD  divalproex (DEPAKOTE) 500 MG DR tablet TAKE 1 TABLET BY MOUTH EVERY 12 HOURS Patient taking differently: Take 500 mg by mouth every 12 (twelve) hours.  11/30/19  Yes Dohmeier, Asencion Partridge, MD  ELIQUIS 5 MG TABS tablet TAKE 1 TABLET BY MOUTH TWICE A DAY Patient taking differently: Take 5 mg by mouth 2 (two) times daily.  11/06/19  Yes Burchette, Alinda Sierras, MD  furosemide (LASIX) 40 MG tablet TAKE 1 TABLET (40 MG TOTAL) BY MOUTH 2 (TWO) TIMES DAILY WITH BREAKFAST AND LUNCH. Patient taking differently: Take 40 mg by mouth in the morning.  08/24/19  Yes Burchette, Alinda Sierras, MD  glimepiride (AMARYL) 2 MG tablet Take 1 tablet (2 mg total) by mouth daily with breakfast. 01/16/19  Yes Burchette, Alinda Sierras, MD  losartan (COZAAR) 50 MG tablet Take 1 tablet (50 mg  total) by mouth daily. 09/23/19  Yes Burchette, Alinda Sierras, MD  metFORMIN (GLUCOPHAGE) 500 MG tablet TAKE 2 TABLETS (1,000 MG TOTAL) BY MOUTH 2 (TWO) TIMES DAILY WITH A MEAL. Patient taking differently: Take 1,000 mg by mouth 2 (two) times daily with a  meal.  10/20/19  Yes Burchette, Alinda Sierras, MD  montelukast (SINGULAIR) 10 MG tablet TAKE 1 TABLET BY MOUTH EVERYDAY AT BEDTIME Patient taking differently: Take 10 mg by mouth at bedtime.  07/13/19  Yes Burchette, Alinda Sierras, MD  MYRBETRIQ 25 MG TB24 tablet Take 25 mg by mouth daily. 05/11/19  Yes [provider]  PRESCRIPTION MEDICATION CPAP- At bedtime   Yes [provider]  zinc gluconate 50 MG tablet Take 50 mg by mouth daily.   Yes [provider]  azelastine (ASTELIN) 0.1 % nasal spray Place 1 spray into both nostrils 2 (two) times daily as needed for rhinitis or allergies. 12/12/18   Angiulli, Lavon Paganini, PA-C  Blood Glucose Monitoring Suppl (ACCU-CHEK AVIVA PLUS) w/Device KIT Use blood glucose machine as instructed on your strips 12/12/18   Angiulli, Lavon Paganini, PA-C  budesonide-formoterol (SYMBICORT) 160-4.5 MCG/ACT inhaler Inhale 2 puffs into the lungs 2 (two) times daily. 12/12/18   Angiulli, Lavon Paganini, PA-C  glucose blood (ACCU-CHEK AVIVA PLUS) test strip Test 2 times daily dx e11.9 12/12/18   Angiulli, Lavon Paganini, PA-C  Lancets (ACCU-CHEK SOFT TOUCH) lancets Check blood sugars once per day. DX: E11.9 12/12/18   Angiulli, Lavon Paganini, PA-C  Sodium Sulfate-Mag Sulfate-KCl (SUTAB) [id number] MG TABS Take 1 kit by mouth as directed. BIN: [id number] PCN: CN GROUP: [id number] MEMBER ID: [id number];URK AS CASH;NO PRIOR AUTHORIZATION 10/27/19   Ladene Artist, MD    Allergies    Patient has no known allergies.  Review of Systems   Review of Systems  Constitutional:       Per HPI, otherwise negative  HENT:       Per HPI, otherwise negative  Eyes: Positive for visual disturbance.  Respiratory:       Per HPI, otherwise negative  Cardiovascular:        Per HPI, otherwise negative  Gastrointestinal: Negative for vomiting.  Endocrine:       Negative aside from HPI  Genitourinary:       Neg aside from HPI   Musculoskeletal:       Per HPI, otherwise negative  Skin: Negative.   Neurological: Positive for weakness, numbness and headaches. Negative for syncope.    Physical Exam Updated Vital Signs BP (!) 149/82   Pulse 78   Temp 98 F (36.7 C) (Temporal)   Resp 19   Wt 102.5 kg   SpO2 93%   BMI 31.52 kg/m   Physical Exam Vitals and nursing note reviewed.  Constitutional:      General: He is not in acute distress.    Appearance: He is well-developed.  HENT:     Head: Normocephalic and atraumatic.  Eyes:     Extraocular Movements: Extraocular movements intact.     Conjunctiva/sclera: Conjunctivae normal.     Comments: Per EMS report the patient had left-sided neglect, but the patient moves his eyes in all dimensions.  Cardiovascular:     Rate and Rhythm: Normal rate and regular rhythm.  Pulmonary:     Effort: Pulmonary effort is normal. No respiratory distress.     Breath sounds: No stridor.  Abdominal:     General: There is no distension.  Skin:    General: Skin is warm and dry.  Neurological:     Mental Status: He is alert and oriented to person, place, and time.     Motor: Atrophy present. No tremor.     Comments: Left lateral visual loss.  Speech is brief, clear. No gross  facial asymmetry. No gross asymmetry of strength.     ED Results / Procedures / Treatments   Labs (all labs ordered are listed, but only abnormal results are displayed) Labs Reviewed  COMPREHENSIVE METABOLIC PANEL - Abnormal; Notable for the following components:      Result Value   Glucose, Bld 118 (*)    Creatinine, Ser 1.35 (*)    Total Protein 5.6 (*)    GFR calc non Af Amer 51 (*)    GFR calc Af Amer 60 (*)    All other components within normal limits  URINALYSIS, ROUTINE W REFLEX MICROSCOPIC - Abnormal; Notable for the  following components:   Nitrite POSITIVE (*)    Leukocytes,Ua MODERATE (*)    Bacteria, UA RARE (*)    All other components within normal limits  I-STAT CHEM 8, ED - Abnormal; Notable for the following components:   Creatinine, Ser 1.40 (*)    Glucose, Bld 115 (*)    Calcium, Ion 1.08 (*)    All other components within normal limits  CBG MONITORING, ED - Abnormal; Notable for the following components:   Glucose-Capillary 117 (*)    All other components within normal limits  SARS CORONAVIRUS 2 BY RT PCR (HOSPITAL ORDER, [place] [facility])  URINE CULTURE  ETHANOL  PROTIME-INR  APTT  CBC  DIFFERENTIAL  RAPID URINE DRUG SCREEN, HOSP PERFORMED  BRAIN NATRIURETIC PEPTIDE  HEMOGLOBIN A1C  LIPID PANEL  CBC  COMPREHENSIVE METABOLIC PANEL    EKG EKG Interpretation  Date/Time:  Thursday December 17 2019 20:34:14 EDT Ventricular Rate:  77 PR Interval:    QRS Duration: 85 QT Interval:  398 QTC Calculation: 451 R Axis:   -24 Text Interpretation: Sinus rhythm Borderline left axis deviation Abnormal R-wave progression, late transition Borderline T wave abnormalities Abnormal ECG Confirmed by Carmin Muskrat [id number]) on 12/17/2019 8:38:18 PM   Radiology CT HEAD CODE STROKE WO CONTRAST  Result Date: 12/17/2019 CLINICAL DATA:  Code stroke.  Initial evaluation for acute stroke. EXAM: CT HEAD WITHOUT CONTRAST TECHNIQUE: Contiguous axial images were obtained from the base of the skull through the vertex without intravenous contrast. COMPARISON:  Prior CT from 09/24/2019. FINDINGS: Brain: 3.9 x 2.7 x 3.9 cm acute intraparenchymal hemorrhage centered at the right parieto-occipital region (estimated volume 21 cc). Mild surrounding vasogenic edema without significant regional mass effect. No other acute intracranial hemorrhage. No other acute large vessel territory infarct. Encephalomalacia of involving the parasagittal anterior right frontal lobe noted, consistent with known prior  infarct in hemorrhage at this location. Underlying atrophy with chronic microvascular ischemic disease. No hydrocephalus or extra-axial fluid collection. Vascular: No hyperdense vessel. Calcified atherosclerosis present at the skull base. Skull: Scalp soft tissues within normal limits.  Calvarium intact. Sinuses/Orbits: Globes and orbital soft tissues demonstrate no acute finding. Mild mucosal thickening noted within the ethmoidal air cells. Paranasal sinuses are otherwise largely clear. No mastoid effusion. Other: None. IMPRESSION: 1. 21 cc acute intraparenchymal hemorrhage centered at the right parieto-occipital region. Mild surrounding vasogenic edema without significant regional mass effect. 2. Underlying atrophy with chronic small vessel ischemic disease with chronic right frontal encephalomalacia. These results were communicated to Dr. Curly Shores at 8:23 pmon 9/9/2021by text page via the [facility] messaging system. Electronically Signed   By: Jeannine Boga M.D.   On: 12/17/2019 20:26    Procedures Procedures (including critical care time)  Medications Ordered in ED Medications  nicardipine (CARDENE) 44m in 0.86% saline 2062mIV infusion (0.1 mg/ml) (5  mg/hr Intravenous New Bag/Given 12/17/19 2141)  carvedilol (COREG) tablet 6.25 mg (has no administration in time range)  divalproex (DEPAKOTE) DR tablet 500 mg (has no administration in time range)  montelukast (SINGULAIR) tablet 10 mg (has no administration in time range)  prothrombin complex conc human (KCENTRA) IVPB 4,666 Units (0 Units Intravenous Stopped 12/17/19 2142)  iohexol (OMNIPAQUE) 350 MG/ML injection 75 mL (75 mLs Intravenous Contrast Given 12/17/19 2023)  labetalol (NORMODYNE) injection 5 mg (5 mg Intravenous Given 12/17/19 2030)  labetalol (NORMODYNE) injection 10 mg (10 mg Intravenous Given 12/17/19 2104)    ED Course  I have reviewed the triage vital signs and the nursing notes.  Pertinent labs & imaging results that were available  during my care of the patient were reviewed by me and considered in my medical decision making (see chart for details).      Update: Head CT reviewed, notable for intracranial hemorrhage, occipital, right.  This consistent with the patient's headache, vision loss.   Update:, Patient back in resuscitation bay. Patient is receiving blood pressure control, has no new complaints. MDM Rules/Calculators/A&P Adult male with prior stroke, now on Eliquis presents with headache, left-sided visual deficits, is found to have intracranial hemorrhage on stat CT after arrival. Patient is awake and alert, airway intact, and his neuro exam is otherwise slightly complicated by his prior stroke history, but with new intracranial hemorrhage, patient required IV blood pressure control, reversal of his blood thinning medication, admission to the neuro ICU for further monitoring, management. Final Clinical Impression(s) / ED Diagnoses Final diagnoses:  Intraparenchymal hemorrhage of brain ([place])   MDM Number of Diagnoses or Management Options Intraparenchymal hemorrhage of brain ([place]): new, needed workup   Amount and/or Complexity of Data Reviewed Clinical lab tests: reviewed Tests in the radiology section of CPT: reviewed Tests in the medicine section of CPT: reviewed Decide to obtain previous medical records or to obtain history from someone other than the patient: yes Obtain history from someone other than the patient: yes Review and summarize past medical records: yes Discuss the patient with other providers: yes Independent visualization of images, tracings, or specimens: yes  Risk of Complications, Morbidity, and/or Mortality Presenting problems: high Diagnostic procedures: high Management options: high  Critical Care Total time providing critical care: (35)  Patient Progress Patient progress: stable     Carmin Muskrat, MD 12/17/19 2344

## 2019-12-17 NOTE — Code Documentation (Signed)
Stroke Response Nurse Documentation Code Documentation  TRENELL CONCANNON is a 74 y.o. male arriving to [facility] ED via [facility] on 9/9 with past medical hx of CVA, afib. Code stroke was activated by EMS. Patient from home where he was LKW at 1300 and now complaining of Headache and loss of vision in Left eye. On Eliquis (apixaban) daily. Stroke team at the bedside on patient arrival. Labs drawn and patient cleared for CT by Dr. . Patient to CT with team. NIHSS 3, see documentation for details and code stroke times. Patient with left hemianopia and left leg weakness on exam. The following imaging was completed:  CTA. Patient is not a candidate for tPA due to Hemorrhage. Bedside handoff with ED RN Threasa Beards.    Madelynn Done  Rapid Response RN

## 2019-12-17 NOTE — ED Triage Notes (Signed)
Pt presents to ED from home BIB [facility]. Pt presents as CODE STROKE. Pt c/o L weakness, L hemiopia, and a mid headache. LKW - 1300, symptoms discovered 1500. Pt hx of stroke

## 2019-12-18 ENCOUNTER — Inpatient Hospital Stay (HOSPITAL_COMMUNITY): Payer: Medicare Other

## 2019-12-18 DIAGNOSIS — I63412 Cerebral infarction due to embolism of left middle cerebral artery: Secondary | ICD-10-CM

## 2019-12-18 DIAGNOSIS — I6389 Other cerebral infarction: Secondary | ICD-10-CM

## 2019-12-18 DIAGNOSIS — Z7901 Long term (current) use of anticoagulants: Secondary | ICD-10-CM

## 2019-12-18 DIAGNOSIS — Z87898 Personal history of other specified conditions: Secondary | ICD-10-CM

## 2019-12-18 DIAGNOSIS — I480 Paroxysmal atrial fibrillation: Secondary | ICD-10-CM

## 2019-12-18 DIAGNOSIS — Z8679 Personal history of other diseases of the circulatory system: Secondary | ICD-10-CM

## 2019-12-18 DIAGNOSIS — I160 Hypertensive urgency: Secondary | ICD-10-CM

## 2019-12-18 DIAGNOSIS — Z86711 Personal history of pulmonary embolism: Secondary | ICD-10-CM

## 2019-12-18 LAB — COMPREHENSIVE METABOLIC PANEL
ALT: 13 U/L (ref 0–44)
AST: 12 U/L — ABNORMAL LOW (ref 15–41)
Albumin: 3.2 g/dL — ABNORMAL LOW (ref 3.5–5.0)
Alkaline Phosphatase: 48 U/L (ref 38–126)
Anion gap: 11 (ref 5–15)
BUN: 15 mg/dL (ref 8–23)
CO2: 24 mmol/L (ref 22–32)
Calcium: 8.4 mg/dL — ABNORMAL LOW (ref 8.9–10.3)
Chloride: 103 mmol/L (ref 98–111)
Creatinine, Ser: 1.38 mg/dL — ABNORMAL HIGH (ref 0.61–1.24)
GFR calc Af Amer: 58 mL/min — ABNORMAL LOW (ref >60)
GFR calc non Af Amer: 50 mL/min — ABNORMAL LOW (ref >60)
Glucose, Bld: 126 mg/dL — ABNORMAL HIGH (ref 70–99)
Potassium: 3.4 mmol/L — ABNORMAL LOW (ref 3.5–5.1)
Sodium: 138 mmol/L (ref 135–145)
Total Bilirubin: 0.6 mg/dL (ref 0.3–1.2)
Total Protein: 5.3 g/dL — ABNORMAL LOW (ref 6.5–8.1)

## 2019-12-18 LAB — CBC
HCT: 43.8 % (ref 39.0–52.0)
Hemoglobin: 14.6 g/dL (ref 13.0–17.0)
MCH: 31.8 pg (ref 26.0–34.0)
MCHC: 33.3 g/dL (ref 30.0–36.0)
MCV: 95.4 fL (ref 80.0–100.0)
Platelets: 171 10*3/uL (ref 150–400)
RBC: 4.59 MIL/uL (ref 4.22–5.81)
RDW: 13.7 % (ref 11.5–15.5)
WBC: 7.2 10*3/uL (ref 4.0–10.5)
nRBC: 0 % (ref 0.0–0.2)

## 2019-12-18 LAB — LIPID PANEL
Cholesterol: 125 mg/dL (ref 0–200)
HDL: 30 mg/dL — ABNORMAL LOW (ref >40)
LDL Cholesterol: 76 mg/dL (ref 0–99)
Total CHOL/HDL Ratio: 4.2 RATIO
Triglycerides: 97 mg/dL (ref <150)
VLDL: 19 mg/dL (ref 0–40)

## 2019-12-18 LAB — ECHOCARDIOGRAM COMPLETE
Area-P 1/2: 3.21 cm2
Height: 71 in
S' Lateral: 2.9 cm
Weight: 3894.21 oz

## 2019-12-18 LAB — BRAIN NATRIURETIC PEPTIDE: B Natriuretic Peptide: 111.2 pg/mL — ABNORMAL HIGH (ref 0.0–100.0)

## 2019-12-18 LAB — HEMOGLOBIN A1C
Hgb A1c MFr Bld: 6.3 % — ABNORMAL HIGH (ref 4.8–5.6)
Mean Plasma Glucose: 134.11 mg/dL

## 2019-12-18 LAB — MRSA PCR SCREENING: MRSA by PCR: NEGATIVE

## 2019-12-18 LAB — GLUCOSE, CAPILLARY: Glucose-Capillary: 127 mg/dL — ABNORMAL HIGH (ref 70–99)

## 2019-12-18 MED ORDER — PANTOPRAZOLE SODIUM 40 MG IV SOLR
40.0000 mg | Freq: Every day | INTRAVENOUS | Status: DC
  Administered 2019-12-18: 40 mg via INTRAVENOUS
  Filled 2019-12-18: qty 40

## 2019-12-18 MED ORDER — AMIODARONE HCL 200 MG PO TABS
100.0000 mg | ORAL_TABLET | Freq: Every day | ORAL | Status: DC
  Administered 2019-12-18 – 2019-12-23 (×6): 100 mg via ORAL
  Filled 2019-12-18 (×6): qty 1

## 2019-12-18 MED ORDER — AZELASTINE HCL 0.1 % NA SOLN: 1.0000 | Freq: Two times a day (BID) | NASAL | Status: DC | PRN

## 2019-12-18 MED ORDER — MOMETASONE FURO-FORMOTEROL FUM 200-5 MCG/ACT IN AERO
2.0000 | INHALATION_SPRAY | Freq: Two times a day (BID) | RESPIRATORY_TRACT | Status: DC
  Administered 2019-12-18 – 2019-12-23 (×11): 2 via RESPIRATORY_TRACT
  Filled 2019-12-18: qty 8.8

## 2019-12-18 MED ORDER — POTASSIUM CHLORIDE CRYS ER 20 MEQ PO TBCR
40.0000 meq | EXTENDED_RELEASE_TABLET | Freq: Two times a day (BID) | ORAL | Status: AC
  Administered 2019-12-18 (×2): 40 meq via ORAL
  Filled 2019-12-18 (×2): qty 2

## 2019-12-18 MED ORDER — ASCORBIC ACID 500 MG PO TABS
500.0000 mg | ORAL_TABLET | Freq: Every day | ORAL | Status: DC
  Administered 2019-12-18 – 2019-12-23 (×6): 500 mg via ORAL
  Filled 2019-12-18 (×6): qty 1

## 2019-12-18 MED ORDER — ACETAMINOPHEN 160 MG/5ML PO SOLN: 650.0000 mg | ORAL | Status: DC | PRN

## 2019-12-18 MED ORDER — ACETAMINOPHEN 325 MG PO TABS: 650.0000 mg | ORAL_TABLET | ORAL | Status: DC | PRN

## 2019-12-18 MED ORDER — LABETALOL HCL 5 MG/ML IV SOLN
5.0000 mg | INTRAVENOUS | Status: DC | PRN
  Administered 2019-12-19 – 2019-12-22 (×4): 10 mg via INTRAVENOUS
  Filled 2019-12-18 (×4): qty 4

## 2019-12-18 MED ORDER — ATORVASTATIN CALCIUM 10 MG PO TABS
10.0000 mg | ORAL_TABLET | Freq: Every day | ORAL | Status: DC
  Administered 2019-12-18: 10 mg via ORAL
  Filled 2019-12-18: qty 1

## 2019-12-18 MED ORDER — ACETAMINOPHEN 650 MG RE SUPP: 650.0000 mg | RECTAL | Status: DC | PRN

## 2019-12-18 MED ORDER — COLCHICINE 0.6 MG PO TABS
0.6000 mg | ORAL_TABLET | Freq: Two times a day (BID) | ORAL | Status: DC | PRN
  Filled 2019-12-18: qty 1

## 2019-12-18 MED ORDER — ALBUTEROL SULFATE (2.5 MG/3ML) 0.083% IN NEBU: 2.5000 mg | INHALATION_SOLUTION | Freq: Four times a day (QID) | RESPIRATORY_TRACT | Status: DC | PRN

## 2019-12-18 MED ORDER — FUROSEMIDE 40 MG PO TABS
40.0000 mg | ORAL_TABLET | Freq: Every day | ORAL | Status: DC
  Administered 2019-12-18 – 2019-12-23 (×6): 40 mg via ORAL
  Filled 2019-12-18 (×6): qty 1

## 2019-12-18 MED ORDER — LOSARTAN POTASSIUM 50 MG PO TABS
50.0000 mg | ORAL_TABLET | Freq: Every day | ORAL | Status: DC
  Administered 2019-12-18 – 2019-12-23 (×6): 50 mg via ORAL
  Filled 2019-12-18 (×6): qty 1

## 2019-12-18 MED ORDER — CHLORHEXIDINE GLUCONATE CLOTH 2 % EX PADS
6.0000 | MEDICATED_PAD | Freq: Every day | CUTANEOUS | Status: DC
  Administered 2019-12-18 – 2019-12-20 (×3): 6 via TOPICAL

## 2019-12-18 MED ORDER — SENNOSIDES-DOCUSATE SODIUM 8.6-50 MG PO TABS
1.0000 | ORAL_TABLET | Freq: Two times a day (BID) | ORAL | Status: DC
  Administered 2019-12-18 – 2019-12-23 (×12): 1 via ORAL
  Filled 2019-12-18 (×12): qty 1

## 2019-12-18 MED ORDER — MIRABEGRON ER 25 MG PO TB24
25.0000 mg | ORAL_TABLET | Freq: Every day | ORAL | Status: DC
  Administered 2019-12-18 – 2019-12-23 (×6): 25 mg via ORAL
  Filled 2019-12-18 (×6): qty 1

## 2019-12-18 MED ORDER — STROKE: EARLY STAGES OF RECOVERY BOOK: Freq: Once | Status: DC

## 2019-12-18 MED ORDER — ALBUTEROL SULFATE HFA 108 (90 BASE) MCG/ACT IN AERS
2.0000 | INHALATION_SPRAY | Freq: Four times a day (QID) | RESPIRATORY_TRACT | Status: DC | PRN
  Filled 2019-12-18: qty 6.7

## 2019-12-18 NOTE — Progress Notes (Signed)
PT Cancellation Note  Patient Details Name: LUDWIG TUGWELL MRN: [id number] DOB: 05-17-1945   Cancelled Treatment:    Reason Eval/Treat Not Completed: Active bedrest order   Lorriane Shire 12/18/2019, 8:10 AM   Lorrin Goodell, PT  Office # [phone] Pager [phone]

## 2019-12-18 NOTE — Progress Notes (Signed)
  Echocardiogram 2D Echocardiogram has been performed.  Ronald [name] 12/18/2019, 10:49 AM

## 2019-12-18 NOTE — Progress Notes (Signed)
Patient arrived to 3w-20 with 2 hearing aids in pink denture canister on sink counter

## 2019-12-18 NOTE — Progress Notes (Addendum)
STROKE TEAM PROGRESS NOTE   INTERVAL HISTORY His wife is at the bedside.  Pt lying in bed, still has left hemianopia. His eliquis reversed by [place]. BP improving, resumed home meds, cardene drip is tapering down at this time. Stated HA and confusion are better than yesterday. Pt and wife declined SATURN trial.    OBJECTIVE Vitals:   12/18/19 0600 12/18/19 0630 12/18/19 0645 12/18/19 0700  BP: 111/79 122/82 119/75 125/77  Pulse: 68 63 68 68  Resp: 19 (!) 26 (!) 22 20  Temp:      TempSrc:      SpO2: 94% 91% 93% 92%  Weight:      Height:        CBC:  Recent Labs  Lab 12/17/19 2003 12/17/19 2012  WBC 6.6  --   NEUTROABS 4.3  --   HGB 15.6 15.6  HCT 47.1 46.0  MCV 96.3  --   PLT 205  --     Basic Metabolic Panel:  Recent Labs  Lab 12/17/19 2003 12/17/19 2012  NA 138 138  K 3.7 3.6  CL 101 99  CO2 25  --   GLUCOSE 118* 115*  BUN 16 19  CREATININE 1.35* 1.40*  CALCIUM 8.9  --     Lipid Panel:     Component Value Date/Time   CHOL 173 10/21/2018 1828   TRIG 306 (H) 10/21/2018 1828   HDL 35 (L) 10/21/2018 1828   CHOLHDL 4.9 10/21/2018 1828   VLDL 61 (H) 10/21/2018 1828   LDLCALC 77 10/21/2018 1828   HgbA1c:  Lab Results  Component Value Date   HGBA1C 5.9 (A) 05/19/2019   Urine Drug Screen:     Component Value Date/Time   LABOPIA NONE DETECTED 12/17/2019 2102   COCAINSCRNUR NONE DETECTED 12/17/2019 2102   LABBENZ NONE DETECTED 12/17/2019 2102   AMPHETMU NONE DETECTED 12/17/2019 2102   THCU NONE DETECTED 12/17/2019 2102   LABBARB NONE DETECTED 12/17/2019 2102    Alcohol Level     Component Value Date/Time   ETH <10 12/17/2019 2003    IMAGING  CT Code Stroke CTA Head W/WO contrast CT Code Stroke CTA Neck W/WO contrast 12/17/2019 IMPRESSION:  1. Stable CTA of the head and neck. No large vessel occlusion.  2. No aneurysm or evidence for vasculitis. No vascular abnormality seen underlying the right cerebral hemorrhage.  3. Scattered  atherosclerotic calcifications about the proximal right and distal vertebral arteries with associated mild multifocal stenoses, stable.  4. Aneurysmal dilatation of the ascending aorta up to 4.1 cm, stable. Recommend annual imaging followup by CTA or MRA. This recommendation follows 2010 ACCF/AHA/AATS/ACR/ASA/SCA/SCAI/SIR/STS/SVM Guidelines for the Diagnosis and Management of Patients with Thoracic Aortic Disease. Circulation. 2010; 121: G644-I347. Aortic aneurysm NOS (ICD10-I71.9)  5. Aortic Atherosclerosis (ICD10-I70.0) and Emphysema (ICD10-J43.9).   CT HEAD WO CONTRAST 12/18/2019 IMPRESSION:  Stable right occipital hemorrhage measuring up to 4 cm. Stable surrounding vasogenic edema. Atrophy, chronic small vessel disease.   MR BRAIN WO CONTRAST 12/18/2019 IMPRESSION:  1. No significant interval change in the acute intraparenchymal right occipital hemorrhage. Mild surrounding edema without significant regional mass effect.  2. 1 cm acute ischemic nonhemorrhagic infarct involving the subcortical left frontal lobe.  3. Innumerable chronic micro hemorrhages throughout both cerebral hemispheres and cerebellum, progressed as compared to 2020. Findings are most suspicious for possible cerebral amyloid angiopathy given distribution. Sequelae of poorly controlled hypertension would be the primary differential consideration.  4. Underlying mild chronic microvascular ischemic disease with chronic right  frontal encephalomalacia.   DG Chest Port 1 View 12/17/2019 IMPRESSION:  Stable cardiomegaly. No acute chest findings.    CT HEAD CODE STROKE WO CONTRAST 12/17/2019 IMPRESSION:  1. 21 cc acute intraparenchymal hemorrhage centered at the right parieto-occipital region. Mild surrounding vasogenic edema without significant regional mass effect.  2. Underlying atrophy with chronic small vessel ischemic disease with chronic right frontal encephalomalacia.   Transthoracic Echocardiogram  1. Left ventricular  ejection fraction, by estimation, is 60 to 65%. The  left ventricle has normal function. The left ventricle has no regional  wall motion abnormalities. There is severe asymmetric left ventricular  hypertrophy. Left ventricular diastolic  parameters are indeterminate.  2. Right ventricular systolic function is normal. The right ventricular  size is normal.  3. The mitral valve is normal in structure. No evidence of mitral valve  regurgitation.  4. The aortic valve is normal in structure. Aortic valve regurgitation is  not visualized. No aortic stenosis is present.  5. Aortic dilatation noted. There is moderate dilatation of the ascending  aorta, measuring 40 mm.   ECG - SR rate 77 BPM. (See cardiology reading for complete details)   PHYSICAL EXAM  Temp:  [98 F (36.7 C)-99.1 F (37.3 C)] 98.3 F (36.8 C) (09/10 1527) Pulse Rate:  [46-84] 71 (09/10 1500) Resp:  [15-28] 20 (09/10 1500) BP: (106-165)/(62-139) 141/90 (09/10 1500) SpO2:  [86 %-98 %] 86 % (09/10 1500) Weight:  [102.5 kg-110.4 kg] 110.4 kg (09/10 0330)  General - Well nourished, well developed, in no apparent distress.  Ophthalmologic - fundi not visualized due to noncooperation.  Cardiovascular - Regular rhythm and rate.  Mental Status -  Level of arousal and orientation to time, place, and person were intact. Language including expression, naming, repetition, comprehension was assessed and found intact.  Cranial Nerves II - XII - II - Visual field exam showed left hemianopia. III, IV, VI - Extraocular movements intact. V - Facial sensation intact bilaterally. VII - Facial movement intact bilaterally. VIII - Hearing & vestibular intact bilaterally. X - Palate elevates symmetrically. XI - Chin turning & shoulder shrug intact bilaterally. XII - Tongue protrusion intact.  Motor Strength - The patient's strength was normal in all extremities and pronator drift was absent.  Bulk was normal and fasciculations  were absent.   Motor Tone - Muscle tone was assessed at the neck and appendages and was normal.  Reflexes - The patient's reflexes were symmetrical in all extremities and he had no pathological reflexes.  Sensory - Light touch, temperature/pinprick were assessed and were symmetrical.    Coordination - The patient had normal movements in the hands with no ataxia or dysmetria.  Tremor was absent.  Gait and Station - deferred.   ASSESSMENT/PLAN [name] is a 74 y.o. male with history of hypertension, hyperlipidemia, COPD, DM, hearing impaired, cardiomyopathy, CHF hx, atrial fibrillation (currently on Eliquis for PE as well), COPD, left frontal hemorrhagic stroke (February 2020, secondary to Eliquis, mild residual right leg weakness, complicated by seizure (now on Depakote), unprovoked pulmonary embolus (restarted on Eliquis in September 2020), and ongoing tobacco abuse presenting with visual difficulties, severe headache, worsening of his chronic left leg weakness and confusion. He did not receive tPA due to [place].  ICH: acute ICH at the right parieto-occipital region, possible cerebral amyloid angiopathy vs. eliquis coagulopathy. Stroke: small acute ischemic infarct at subcortical left frontal lobe - afib vs. small vessel disease   CT Head - 21 cc acute intraparenchymal hemorrhage  centered at the right parieto-occipital region.     CT head - Stable right occipital hemorrhage measuring up to 4 cm. Stable surrounding vasogenic edema.   CTA H&N -  Stable CTA of the head and neck. No large vessel occlusion. No aneurysm or evidence for vasculitis. No vascular abnormality seen underlying the right cerebral hemorrhage.  MRI head -  No significant interval change in the acute intraparenchymal right occipital hemorrhage. 1 cm acute ischemic nonhemorrhagic infarct involving the subcortical left frontal lobe. Innumerable chronic micro hemorrhages throughout both cerebral hemispheres and  cerebellum, progressed as compared to 2020. Findings are most suspicious for possible cerebral amyloid angiopathy given distribution. Sequelae of poorly controlled hypertension would be the primary differential consideration.   2D Echo EF 60-65%  [employer] Virus 2 - negative  LDL 76  HgbA1c 6.3  UDS - negative  VTE prophylaxis - SCDs  Eliquis (apixaban) daily prior to admission, now on No antithrombotic due to [place]  On Depakote, continue - depakote level pending  Ongoing aggressive stroke risk factor management  Therapy recommendations:  pending  Disposition:  Pending  Hx of ICH and seizure  10/2018 admitted for right frontal ICH in the setting of Eliquis.  Status post Kcentra reversal.  CTA head and neck no LVO.  CTV negative.  MRI right parafalcine frontal ICH with edema and local mass-effect.  No CAA identified.  EF more than 65%.  EEG normal.  LDL 77 and A1c 6.9.  Had GTC seizure on presentation, loaded with Keppra.  EEG normal.  Initially BP was high and treated with Cleviprex later tapered off.  He was discharged with aspirin 81 and Keppra.  Resume Lipitor 10.  Patient and wife refused SATURN trial.  11/2018 admitted for seizure.  Keppra switched to Depakote due to possible side effect of Keppra causing confusion.  MRI with and without contrast no acute abnormality  12/2018 admitted for PE.  Was put on heparin drip and then switched to Eliquis.  Follow with Dr. Jannifer Franklin at [facility].  Possible CAA  Lobar ICH in 10/2018 and this admission  Hypertensive in 10/2018 but no significant hypertension this admission  MRI in 10/2018 did not show clear CAA but this admission MRI concerning for possible CAA  Given patient recurrent lobar ICH and possible CAA, anticoagulation may be discontinued.  However, patient had A. fib and developed a PE in 12/2018 without anticoagulation, making anticoagulation necessary.  Clinical dilemma for antithrombotic decision.  Discussed with wife and patient,  they would like to revisit this issue at the time of hematoma resolution, likely an outpatient follow-up.  PAF Hx of PE in the absence of anticoagulation 12/2018  On eliquis 5mg  bid PTA  Currently of AC due to [place]  Clinical dilemma for antithrombotic decision.  Discussed with wife and patient, they would like to revisit this issue at the time of hematoma resolution, likely an outpatient follow-up.  Hypertension  Home BP meds: Cozaar ; Coreg   Current BP meds: Cozaar ; Coreg resumed  Stable now  On cardene, taper off if able . SBP goal < 160 mm Hg initially . Long-term BP goal normotensive  Hyperlipidemia  Home Lipid lowering medication: Lipitor 10 mg daily   LDL - 76, goal < 70  Currently lipitor on hold given acute ICH  Continue statin at discharge  Diabetes  Home diabetic meds: metformin  Current diabetic meds: none  HgbA1c 6.3, goal < 7.0  SSI  CBG monitoring  Close PCP follow up  Tobacco abuse  Current smoker  Smoking cessation counseling provided  Pt is willing to quit  Other Stroke Risk Factors  Advanced age  Previous ETOH use  Obesity, Body mass index is 33.95 kg/m., recommend weight loss, diet and exercise as appropriate   Family hx stroke (mother)   OSA on CPAP  CHF hx  Other Active Problems  Code status - Full code  Aneurysmal dilatation of the ascending aorta up to 4.1 cm, stable. Recommend annual imaging followup by CTA or MRA   Aortic Atherosclerosis (ICD10-I70.0) Emphysema (CNG39-E32.9)  CKD - stage 3a - creatinine - 1.35->1.40->1.38 Hypokalemia K 3.4-supplement  Hospital day # 1  This patient is critically ill due to [place], stroke, hypertensive urgency, PAF, seizure, possible CAA and at significant risk of neurological worsening, death form hematoma expansion, recurrent stroke, hypertensive encephalopathy, heart failure, status epilepticus. This patient's care requires constant monitoring of vital signs, hemodynamics,  respiratory and cardiac monitoring, review of multiple databases, neurological assessment, discussion with family, other specialists and medical decision making of high complexity. I spent 40 minutes of neurocritical care time in the care of this patient. I had long discussion with wife and patient at bedside, updated pt current condition, treatment plan and potential prognosis, and answered all the questions.  They expressed understanding and appreciation.    Rosalin Hawking, MD PhD Stroke Neurology 12/18/2019 5:05 PM    To contact Stroke Continuity provider, please refer to [email or web address] After hours, contact General Neurology

## 2019-12-18 NOTE — Evaluation (Signed)
Physical Therapy Evaluation Patient Details Name: Ronald [name] MRN: [id number] DOB: 1945-11-21 Today's Date: 12/18/2019   History of Present Illness  Ronald [name] is a 74 y.o. male with a past medical history significant for hypertension, hyperlipidemia, atrial fibrillation, COPD, left frontal hemorrhagic stroke (February 2020, mild residual right leg weakness, complicated by seizure--went to CIR), unprovoked pulmonary embolus, and ongoing tobacco abuse. CT: Acute intraparenchymal hemorrhage centered at the rightparieto-occipital region. Mild surrounding vasogenic edema withoutsignificant regional mass effect.    Clinical Impression  Pt admitted with above diagnosis. PTA pt independent, living at home with his wife. On eval, he required supervision bed mobility, min assist transfers, and min/HHA +2 safety 90'. Pt's balance and mobility are significantly affected by visual deficits. See OT eval for vision details. Pt will benefit from skilled PT to increase their independence and safety with mobility to allow discharge to the venue listed below.       Follow Up Recommendations CIR    Equipment Recommendations  None recommended by PT    Recommendations for Other Services Rehab consult     Precautions / Restrictions Precautions Precautions: Fall Restrictions Weight Bearing Restrictions: No      Mobility  Bed Mobility Overal bed mobility: Needs Assistance Bed Mobility: Supine to Sit     Supine to sit: Supervision;HOB elevated     General bed mobility comments: +rail, supervision for safety  Transfers Overall transfer level: Needs assistance Equipment used: 1 person hand held assist Transfers: Sit to/from Stand Sit to Stand: Min assist         General transfer comment: assist to stabilize balance  Ambulation/Gait Ambulation/Gait assistance: Min assist;+2 safety/equipment Gait Distance (Feet): 90 Feet Assistive device: 1 person hand held assist Gait  Pattern/deviations: Step-through pattern;Decreased stride length Gait velocity: decreased Gait velocity interpretation: <1.31 ft/sec, indicative of household ambulator General Gait Details: HHA on right, assist to maintain balance, guarded gait  Stairs            Wheelchair Mobility    Modified Rankin (Stroke Patients Only) Modified Rankin (Stroke Patients Only) Pre-Morbid Rankin Score: No significant disability Modified Rankin: Moderately severe disability     Balance Overall balance assessment: Needs assistance Sitting-balance support: No upper extremity supported;Feet supported Sitting balance-Leahy Scale: Good     Standing balance support: No upper extremity supported;During functional activity Standing balance-Leahy Scale: Poor Standing balance comment: external support to maintain balance                             Pertinent Vitals/Pain Pain Assessment: No/denies pain    Home Living Family/patient expects to be discharged to:: Private residence Living Arrangements: Spouse/significant other Available Help at Discharge: Family;Available 24 hours/day Type of Home: House Home Access: Stairs to enter Entrance Stairs-Rails: Right;Left;Can reach both Entrance Stairs-Number of Steps: 2 Home Layout: Able to live on main level with bedroom/bathroom Home Equipment: Walker - 4 wheels;Cane - single point;Bedside commode;Shower seat      Prior Function Level of Independence: Independent               Hand Dominance   Dominant Hand: Left    Extremity/Trunk Assessment   Upper Extremity Assessment Upper Extremity Assessment: Overall WFL for tasks assessed    Lower Extremity Assessment Lower Extremity Assessment: Overall WFL for tasks assessed    Cervical / Trunk Assessment Cervical / Trunk Assessment: Normal  Communication   Communication: HOH  Cognition Arousal/Alertness: Awake/alert Behavior During Therapy:  WFL for tasks  assessed/performed Overall Cognitive Status: Within Functional Limits for tasks assessed                                        General Comments General comments (skin integrity, edema, etc.): visual deficits affecting balance/mobility    Exercises     Assessment/Plan    PT Assessment Patient needs continued PT services  PT Problem List Decreased mobility;Decreased coordination;Decreased balance       PT Treatment Interventions Therapeutic activities;Gait training;Therapeutic exercise;Patient/family education;Balance training;Stair training;Functional mobility training;Neuromuscular re-education    PT Goals (Current goals can be found in the Care Plan section)  Acute Rehab PT Goals Patient Stated Goal: to be able to see/walk better PT Goal Formulation: With patient/family Time For Goal Achievement: 01/01/20 Potential to Achieve Goals: Good    Frequency Min 4X/week   Barriers to discharge        Co-evaluation PT/OT/SLP Co-Evaluation/Treatment: Yes Reason for Co-Treatment: For patient/therapist safety;To address functional/ADL transfers PT goals addressed during session: Mobility/safety with mobility;Balance OT goals addressed during session: Strengthening/ROM;ADL's and self-care       AM-PAC PT "6 Clicks" Mobility  Outcome Measure Help needed turning from your back to your side while in a flat bed without using bedrails?: None Help needed moving from lying on your back to sitting on the side of a flat bed without using bedrails?: None Help needed moving to and from a bed to a chair (including a wheelchair)?: A Little Help needed standing up from a chair using your arms (e.g., wheelchair or bedside chair)?: A Little Help needed to walk in hospital room?: A Little Help needed climbing 3-5 steps with a railing? : A Lot 6 Click Score: 19    End of Session Equipment Utilized During Treatment: Gait belt Activity Tolerance: Patient tolerated treatment  well Patient left: in chair;with chair alarm set;with call bell/phone within reach;with family/visitor present Nurse Communication: Mobility status PT Visit Diagnosis: Unsteadiness on feet (R26.81);Difficulty in walking, not elsewhere classified (R26.2)    Time: 0272-5366 PT Time Calculation (min) (ACUTE ONLY): 38 min   Charges:   PT Evaluation $PT Eval Moderate Complexity: 1 Mod          Lorrin Goodell, PT  Office # [phone] Pager [phone]   Lorriane Shire 12/18/2019, 11:32 AM

## 2019-12-18 NOTE — ED Notes (Signed)
Paged Neuro to notify pt passed swallow screen

## 2019-12-18 NOTE — Progress Notes (Signed)
Rehab Admissions Coordinator Note:  Patient was screened by Cleatrice Burke for appropriateness for an Inpatient Acute Rehab Consult per therapy recommendations.   At this time, we are recommending Inpatient Rehab consult if patient fails to progress with therapies. Please advise.  Cleatrice Burke RN MSN 12/18/2019, 11:44 AM  I can be reached at [phone].

## 2019-12-18 NOTE — TOC Initial Note (Signed)
Transition of Care [facility]) - Initial/Assessment Note    Patient Details  Name: Ronald [name] MRN: [id number] Date of Birth: [birth year]  [facility] of [facility]) CM/SW Contact:    Verdell Carmine, RN Phone Number: 12/18/2019, 9:37 AM  Clinical Narrative:                  Patient admitted to ICU with Scissors affecting r frontal . Some vision loss and Hemiplegia. Did pass swallow study. Via speech. May need IP rehab or SNF rehab prior to home. CM will cont to follow for needs.    Expected Discharge Plan: IP Rehab Facility Barriers to Discharge: Continued Medical Work up   Patient Goals and CMS Choice        Expected Discharge Plan and Services Expected Discharge Plan: [place]       Living arrangements for the past 2 months: Single Family Home                                      Prior Living Arrangements/Services Living arrangements for the past 2 months: Single Family Home Lives with:: Spouse Patient language and need for interpreter reviewed:: Yes        Need for Family Participation in Patient Care: Yes (Comment) Care giver support system in place?: Yes (comment)   Criminal Activity/Legal Involvement Pertinent to Current Situation/Hospitalization: No - Comment as needed  Activities of Daily Living      Permission Sought/Granted                  Emotional Assessment Appearance:: Appears stated age     Orientation: : Oriented to Self, Oriented to Place, Oriented to  Time Alcohol / Substance Use: Not Applicable Psych Involvement: No (comment)  Admission diagnosis:  ICH (intracerebral hemorrhage) ([place]) [I61.9] Hypoxia [R09.02] Intraparenchymal hemorrhage of brain [facility]) [I61.9] Patient Active Problem List   Diagnosis Date Noted  . Back pain 01/23/2019  . Lower extremity edema 01/15/2019  . Fatigue due to treatment 01/15/2019  . Acute pulmonary embolism ([place]) 01/04/2019  . Acute respiratory failure with hypoxia ([place]) 01/04/2019  .  Pain   . AMS (altered mental status) 11/23/2018  . Acute encephalopathy 11/22/2018  . CKD (chronic kidney disease), stage III 11/22/2018  . Thoracic ascending aortic aneurysm ([place]) 11/22/2018  . Seizures ([place]), secondsry to [facility] 10/23/2018  . Coagulopathy ([place]), Eliquis 10/23/2018  . Cerebral edema ([place]) 10/23/2018  . Hypertensive emergency 10/23/2018  . Advanced age 68/16/2020  . ICH (intracerebral hemorrhage) ([place]) w/ SAH while on Eliquis 10/21/2018  . Hepatic steatosis 04/22/2018  . Actinic keratoses 06/07/2017  . BPH (benign prostatic hyperplasia) 05/30/2017  . Cardiomyopathy due to systemic disease ([place]) 03/09/2017  . Chronic diastolic CHF (congestive heart failure) ([place])   . History of colonic polyps 07/03/2016  . Chronic anticoagulation 07/03/2016  . Hyperglycemia, drug-induced 04/05/2015  . 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 On amiodarone therapy 12/08/2014  . Edema of both legs 12/08/2014  . Exertional dyspnea 08/18/2014  . Hypokalemia   . Tobacco abuse   . Paroxysmal atrial fibrillation ([facility]) - CHA2DS2Vasc = 7; On Eliquis, Amiodarone 05/04/2014  . Cigarette smoker two packs a day or less   . Prostate cancer ([place]) 10/15/2013  . Obesity (BMI 30-39.9) 04/17/2013  . Umbilical hernia 85/05/7739  . Right inguinal hernia 06/23/2012  . Hydrocele 06/19/2012  . Metabolic syndrome 28/78/6767  .  Type 2 diabetes mellitus with hyperglycemia ([place]) 03/20/2012  . Elevated PSA 03/20/2012  . Gout, unspecified 10/07/2009  . TINEA VERSICOLOR 07/19/2009  . PERS HX TOBACCO USE PRESENTING HAZARDS HEALTH 07/19/2009  . Obstructive sleep apnea 07/02/2008  . Hyperlipidemia with target LDL less than 70 07/01/2008  . Essential hypertension 07/01/2008  . ALLERGIC RHINITIS 07/01/2008   PCP:  [name], MD Pharmacy:   [id number] [place], [address] [place] [address] Phone:  [phone] Fax: [phone]     Social Determinants of Health (SDOH) Interventions    Readmission Risk Interventions No flowsheet data found.

## 2019-12-18 NOTE — Evaluation (Addendum)
Occupational Therapy Evaluation Patient Details Name: Ronald [name] MRN: [id number] DOB: 03-18-1946 Today's Date: 12/18/2019    History of Present Illness Ronald [name] is a 74 y.o. male with a past medical history significant for hypertension, hyperlipidemia, atrial fibrillation, COPD, left frontal hemorrhagic stroke (February 2020, mild residual right leg weakness, complicated by seizure--went to CIR), unprovoked pulmonary embolus, and ongoing tobacco abuse. CT: Acute intraparenchymal hemorrhage centered at the rightparieto-occipital region. Mild surrounding vasogenic edema withoutsignificant regional mass effect.   Clinical Impression   This 74 yo male admitted with above presents to acute OT with PLOF of being totally independent with basic ADLs, IADLs, and driving. Currently pt is setup/S-min A for all basic ADLs and mobility due to his vision impairment (in gross testing he appears to not only have left homonymous hemianopsia but also a right inferior homonymous quadranopsia), thus affecting every aspect of his life. While ambulating with him he asked "where did she go" when I stepped out of his visual field to his left and when he stepped on the threshold where the buildings join--he stopped to look at what he had stepped on, he had no idea it was there. He will continue to benefit from acute with follow up OT on CIR for him to have intense training for visual compensation with basic ADLs and mobility.    Follow Up Recommendations  CIR;Supervision/Assistance - 24 hour          Precautions / Restrictions Precautions Precautions: Fall Restrictions Weight Bearing Restrictions: No      Mobility Bed Mobility Overal bed mobility: Needs Assistance Bed Mobility: Supine to Sit     Supine to sit: Supervision        Transfers Overall transfer level: Needs assistance Equipment used: 1 person hand held assist Transfers: Sit to/from Stand Sit to Stand: Min assist               Balance Overall balance assessment: Needs assistance Sitting-balance support: No upper extremity supported;Feet supported Sitting balance-Leahy Scale: Good     Standing balance support: No upper extremity supported Standing balance-Leahy Scale: Poor Standing balance comment: Vision affects both static and dynamic standing                           ADL either performed or assessed with clinical judgement   ADL Overall ADL's : Needs assistance/impaired Eating/Feeding: Set up;Sitting   Grooming: Supervision/safety;Set up;Standing   Upper Body Bathing: Set up;Sitting   Lower Body Bathing: Minimal assistance;Sit to/from stand   Upper Body Dressing : Set up;Sitting   Lower Body Dressing: Minimal assistance;Sit to/from stand   Toilet Transfer: Minimal assistance;Ambulation Toilet Transfer Details (indicate cue type and reason): Bed>walk in hallway>back to room to sit in recliner Toileting- Clothing Manipulation and Hygiene: Minimal assistance;Sit to/from stand               Vision Baseline Vision/History: No visual deficits Patient Visual Report: Peripheral vision impairment Vision Assessment?: Yes Eye Alignment: Within Functional Limits Ocular Range of Motion: Within Functional Limits (when asked to look up, down, left, and right) Alignment/Gaze Preference: Within Defined Limits (but to read or see something he does better in upper visual field) Tracking/Visual Pursuits:  (see additional comments below) Visual Fields: Left homonymous hemianopsia;Right inferior homonymous quadranopsia (it appears both) Depth Perception: Undershoots;Overshoots (unless in right superior visual field--then hits target) Additional Comments: Cannot track fully to left due to loses sight of object; this also happens in  lower visual field on right in both eyes            Pertinent Vitals/Pain Pain Assessment: No/denies pain     Hand Dominance Left   Extremity/Trunk  Assessment Upper Extremity Assessment Upper Extremity Assessment: Overall WFL for tasks assessed           Communication Communication Communication: HOH   Cognition Arousal/Alertness: Awake/alert Behavior During Therapy: WFL for tasks assessed/performed Overall Cognitive Status: Within Functional Limits for tasks assessed                                                Home Living Family/patient expects to be discharged to:: Private residence Living Arrangements: Spouse/significant other Available Help at Discharge: Family;Available 24 hours/day Type of Home: House Home Access: Stairs to enter [employer] of Steps: 2 Entrance Stairs-Rails: Right;Left;Can reach both Home Layout: Able to live on main level with bedroom/bathroom Alternate Level Stairs-Number of Steps: Buyer, retail Shower/Tub: Occupational psychologist: Handicapped height Bathroom Accessibility: Yes   Home Equipment: Environmental consultant - 4 wheels;Cane - single point;Bedside commode;Shower seat          Prior Functioning/Environment Level of Independence: Independent                 OT Problem List: Impaired balance (sitting and/or standing);Impaired vision/perception;Decreased safety awareness      OT Treatment/Interventions: Self-care/ADL training;Therapeutic activities;Visual/perceptual remediation/compensation;Patient/family education;Balance training    OT Goals(Current goals can be found in the care plan section) Acute Rehab OT Goals Patient Stated Goal: to be able to see/walk better OT Goal Formulation: With patient/family Time For Goal Achievement: 01/01/20 Potential to Achieve Goals: Good ADL Goals Pt Will Perform Grooming: with modified independence;standing (increased time to find items) Pt Will Perform Upper Body Bathing: with modified independence;sitting;standing (increased time to find items) Pt Will Perform Lower Body Bathing: with modified  independence;sit to/from stand (increased time to find items) Pt Will Perform Upper Body Dressing: with modified independence;sitting (increased time to find items) Pt Will Perform Lower Body Dressing: with modified independence;sit to/from stand (increased time to find items) Pt Will Transfer to Toilet: with modified independence;regular height toilet;grab bars;ambulating (increased time survey his environment) Pt Will Perform Toileting - Clothing Manipulation and hygiene: with modified independence;sit to/from stand Additional ADL Goal #1: Pt will be able to navigate around room and not run into or trip over items in his way Additional ADL Goal #2: Pt will be able to complete pen and paper tasks Mod I (increased time)  OT Frequency: Min 2X/week           AM-PAC OT "6 Clicks" Daily Activity     Outcome Measure Help from another person eating meals?: A Little Help from another person taking care of personal grooming?: A Little Help from another person toileting, which includes using toliet, bedpan, or urinal?: A Little Help from another person bathing (including washing, rinsing, drying)?: A Little Help from another person to put on and taking off regular upper body clothing?: A Little Help from another person to put on and taking off regular lower body clothing?: A Little 6 Click Score: 18   End of Session Equipment Utilized During Treatment: Gait belt Nurse Communication: Mobility status  Activity Tolerance: Patient tolerated treatment well Patient left: in chair;with call bell/phone within reach;with chair alarm set;with family/visitor present  OT Visit Diagnosis: Unsteadiness on feet (R26.81);Other abnormalities of gait and mobility (R26.89);History of falling (Z91.81);Low vision, both eyes (H54.2)                Time: 3748-2707 OT Time Calculation (min): 39 min Charges:  OT General Charges $OT Visit: 1 Visit OT Evaluation $OT Eval Moderate Complexity: 1 Mod OT  Treatments $Self Care/Home Management : 8-22 mins  [place], OTR/L Acute [employer] Pager [phone] Office [phone]     Almon Register 12/18/2019, 10:31 AM

## 2019-12-18 NOTE — Progress Notes (Signed)
SLP Cancellation Note  Patient Details Name: DUELL HOLDREN MRN: [id number] DOB: 01-01-1946   Cancelled treatment:       Reason Eval/Treat Not Completed: Patient at procedure or test/unavailable.  Attempted x2 to evaluate pt, but pt getting echo on one occasion and this clinician called to radiology on other .  Will continue efforts.  Allin Frix L. Tivis Ringer, [place] Office number [phone] Pager [phone]    [name] 12/18/2019, 2:10 PM

## 2019-12-19 LAB — COMPREHENSIVE METABOLIC PANEL
ALT: 12 U/L (ref 0–44)
AST: 13 U/L — ABNORMAL LOW (ref 15–41)
Albumin: 3.5 g/dL (ref 3.5–5.0)
Alkaline Phosphatase: 57 U/L (ref 38–126)
Anion gap: 9 (ref 5–15)
BUN: 13 mg/dL (ref 8–23)
CO2: 28 mmol/L (ref 22–32)
Calcium: 9 mg/dL (ref 8.9–10.3)
Chloride: 101 mmol/L (ref 98–111)
Creatinine, Ser: 1.5 mg/dL — ABNORMAL HIGH (ref 0.61–1.24)
GFR calc Af Amer: 52 mL/min — ABNORMAL LOW (ref >60)
GFR calc non Af Amer: 45 mL/min — ABNORMAL LOW (ref >60)
Glucose, Bld: 106 mg/dL — ABNORMAL HIGH (ref 70–99)
Potassium: 3.7 mmol/L (ref 3.5–5.1)
Sodium: 138 mmol/L (ref 135–145)
Total Bilirubin: 0.9 mg/dL (ref 0.3–1.2)
Total Protein: 5.9 g/dL — ABNORMAL LOW (ref 6.5–8.1)

## 2019-12-19 LAB — CBC
HCT: 45.1 % (ref 39.0–52.0)
Hemoglobin: 14.9 g/dL (ref 13.0–17.0)
MCH: 31.4 pg (ref 26.0–34.0)
MCHC: 33 g/dL (ref 30.0–36.0)
MCV: 94.9 fL (ref 80.0–100.0)
Platelets: 180 10*3/uL (ref 150–400)
RBC: 4.75 MIL/uL (ref 4.22–5.81)
RDW: 13.6 % (ref 11.5–15.5)
WBC: 6.6 10*3/uL (ref 4.0–10.5)
nRBC: 0 % (ref 0.0–0.2)

## 2019-12-19 LAB — VALPROIC ACID LEVEL: Valproic Acid Lvl: 56 ug/mL (ref 50.0–100.0)

## 2019-12-19 NOTE — Progress Notes (Signed)
STROKE TEAM PROGRESS NOTE   INTERVAL HISTORY Patient is sitting up in bed.  He still has persistent vision deficit.  MRI shows a hemorrhagic right occipital lobe infarct and he had a previous left frontal hemorrhagic infarct in July 2020 also on anticoagulation.  He has no new complaints.  He wants to go home.  Physical therapist is in the room about to assess him.   OBJECTIVE Vitals:   12/18/19 1947 12/18/19 2344 12/19/19 0331 12/19/19 0747  BP: (!) 147/83 (!) 172/95 (!) 166/92 (!) 162/81  Pulse: 69 66 70 (!) 51  Resp: 18 16 18 18   Temp: 98.7 F (37.1 C) 98.3 F (36.8 C) 98.6 F (37 C) 98.9 F (37.2 C)  TempSrc: Oral Oral Oral Oral  SpO2: 93% 97% 92% 95%  Weight:      Height:        CBC:  Recent Labs  Lab 12/17/19 2003 12/17/19 2012 12/18/19 0644 12/19/19 0313  WBC 6.6   < > 7.2 6.6  NEUTROABS 4.3  --   --   --   HGB 15.6   < > 14.6 14.9  HCT 47.1   < > 43.8 45.1  MCV 96.3   < > 95.4 94.9  PLT 205   < > 171 180   < > = values in this interval not displayed.    Basic Metabolic Panel:  Recent Labs  Lab 12/18/19 0644 12/19/19 0313  NA 138 138  K 3.4* 3.7  CL 103 101  CO2 24 28  GLUCOSE 126* 106*  BUN 15 13  CREATININE 1.38* 1.50*  CALCIUM 8.4* 9.0    Lipid Panel:     Component Value Date/Time   CHOL 125 12/18/2019 0644   TRIG 97 12/18/2019 0644   HDL 30 (L) 12/18/2019 0644   CHOLHDL 4.2 12/18/2019 0644   VLDL 19 12/18/2019 0644   LDLCALC 76 12/18/2019 0644   HgbA1c:  Lab Results  Component Value Date   HGBA1C 6.3 (H) 12/18/2019   Urine Drug Screen:     Component Value Date/Time   LABOPIA NONE DETECTED 12/17/2019 2102   COCAINSCRNUR NONE DETECTED 12/17/2019 2102   LABBENZ NONE DETECTED 12/17/2019 2102   AMPHETMU NONE DETECTED 12/17/2019 2102   THCU NONE DETECTED 12/17/2019 2102   LABBARB NONE DETECTED 12/17/2019 2102    Alcohol Level     Component Value Date/Time   ETH <10 12/17/2019 2003    IMAGING  CT Code Stroke CTA Head W/WO  contrast CT Code Stroke CTA Neck W/WO contrast 12/17/2019 IMPRESSION:  1. Stable CTA of the head and neck. No large vessel occlusion.  2. No aneurysm or evidence for vasculitis. No vascular abnormality seen underlying the right cerebral hemorrhage.  3. Scattered atherosclerotic calcifications about the proximal right and distal vertebral arteries with associated mild multifocal stenoses, stable.  4. Aneurysmal dilatation of the ascending aorta up to 4.1 cm, stable. Recommend annual imaging followup by CTA or MRA. This recommendation follows 2010 ACCF/AHA/AATS/ACR/ASA/SCA/SCAI/SIR/STS/SVM Guidelines for the Diagnosis and Management of Patients with Thoracic Aortic Disease. Circulation. 2010; 121: I778-E423. Aortic aneurysm NOS (ICD10-I71.9)  5. Aortic Atherosclerosis (ICD10-I70.0) and Emphysema (ICD10-J43.9).   CT HEAD WO CONTRAST 12/18/2019 IMPRESSION:  Stable right occipital hemorrhage measuring up to 4 cm. Stable surrounding vasogenic edema. Atrophy, chronic small vessel disease.   MR BRAIN WO CONTRAST 12/18/2019 IMPRESSION:  1. No significant interval change in the acute intraparenchymal right occipital hemorrhage. Mild surrounding edema without significant regional mass effect.  2. 1  cm acute ischemic nonhemorrhagic infarct involving the subcortical left frontal lobe.  3. Innumerable chronic micro hemorrhages throughout both cerebral hemispheres and cerebellum, progressed as compared to 2020. Findings are most suspicious for possible cerebral amyloid angiopathy given distribution. Sequelae of poorly controlled hypertension would be the primary differential consideration.  4. Underlying mild chronic microvascular ischemic disease with chronic right frontal encephalomalacia.   DG Chest Port 1 View 12/17/2019 IMPRESSION:  Stable cardiomegaly. No acute chest findings.    CT HEAD CODE STROKE WO CONTRAST 12/17/2019 IMPRESSION:  1. 21 cc acute intraparenchymal hemorrhage centered at the right  parieto-occipital region. Mild surrounding vasogenic edema without significant regional mass effect.  2. Underlying atrophy with chronic small vessel ischemic disease with chronic right frontal encephalomalacia.   Transthoracic Echocardiogram  1. Left ventricular ejection fraction, by estimation, is 60 to 65%. The  left ventricle has normal function. The left ventricle has no regional  wall motion abnormalities. There is severe asymmetric left ventricular  hypertrophy. Left ventricular diastolic  parameters are indeterminate.  2. Right ventricular systolic function is normal. The right ventricular  size is normal.  3. The mitral valve is normal in structure. No evidence of mitral valve  regurgitation.  4. The aortic valve is normal in structure. Aortic valve regurgitation is  not visualized. No aortic stenosis is present.  5. Aortic dilatation noted. There is moderate dilatation of the ascending  aorta, measuring 40 mm.   ECG - SR rate 77 BPM. (See cardiology reading for complete details)   PHYSICAL EXAM  Temp:  [97.5 F (36.4 C)-98.9 F (37.2 C)] 98.9 F (37.2 C) (09/11 0747) Pulse Rate:  [51-72] 51 (09/11 0747) Resp:  [15-28] 18 (09/11 0747) BP: (126-172)/(81-103) 162/81 (09/11 0747) SpO2:  [86 %-97 %] 95 % (09/11 0747)  General -obese elderly Caucasian male not in distress. Ophthalmologic - fundi not visualized due to noncooperation.  Cardiovascular - Regular rhythm and rate.  Neurological Exam ;  Awake  Alert oriented x 3. Normal speech and language.eye movements full without nystagmus.fundi were not visualized. Vision acuity  appears normal.  But dense left homonymous hemianopsia.  Hearing is normal. Palatal movements are normal. Face symmetric. Tongue midline. Normal strength, tone, reflexes and coordination. Normal sensation. Gait deferred.  ASSESSMENT/PLAN [name] is a 74 y.o. male with history of hypertension, hyperlipidemia, COPD, DM, hearing  impaired, cardiomyopathy, CHF hx, atrial fibrillation (currently on Eliquis for PE as well), COPD, left frontal hemorrhagic stroke (February 2020, secondary to Eliquis, mild residual right leg weakness, complicated by seizure (now on Depakote), unprovoked pulmonary embolus (restarted on Eliquis in September 2020), and ongoing tobacco abuse presenting with visual difficulties, severe headache, worsening of his chronic left leg weakness and confusion. He did not receive tPA due to [place].  ICH: acute ICH at the right parieto-occipital region, possible hemorrhagic infarct secondary to Eliquis additional small acute embolic infarct at subcortical left frontal lobe - afib    CT Head - 21 cc acute intraparenchymal hemorrhage centered at the right parieto-occipital region.     CT head - Stable right occipital hemorrhage measuring up to 4 cm. Stable surrounding vasogenic edema.   CTA H&N -  Stable CTA of the head and neck. No large vessel occlusion. No aneurysm or evidence for vasculitis. No vascular abnormality seen underlying the right cerebral hemorrhage.  MRI head -  No significant interval change in the acute intraparenchymal right occipital hemorrhage. 1 cm acute ischemic nonhemorrhagic infarct involving the subcortical left frontal lobe. Innumerable  chronic micro hemorrhages throughout both cerebral hemispheres and cerebellum, progressed as compared to 2020. Findings are most suspicious for possible cerebral amyloid angiopathy given distribution. Sequelae of poorly controlled hypertension would be the primary differential consideration.   2D Echo EF 60-65%  [employer] Virus 2 - negative  LDL 76  HgbA1c 6.3  UDS - negative  VTE prophylaxis - SCDs  Eliquis (apixaban) daily prior to admission, now on No antithrombotic due to [place]  On Depakote, continue - depakote level 56 (therapeutic range 50 - 100)  Ongoing aggressive stroke risk factor management  Therapy recommendations:  Possible CIR  candidate  Disposition:  Pending  Hx of ICH and seizure  10/2018 admitted for right frontal ICH in the setting of Eliquis.  Status post Kcentra reversal.  CTA head and neck no LVO.  CTV negative.  MRI right parafalcine frontal ICH with edema and local mass-effect.  No CAA identified.  EF more than 65%.  EEG normal.  LDL 77 and A1c 6.9.  Had GTC seizure on presentation, loaded with Keppra.  EEG normal.  Initially BP was high and treated with Cleviprex later tapered off.  He was discharged with aspirin 81 and Keppra.  Resume Lipitor 10.  Patient and wife refused SATURN trial.  11/2018 admitted for seizure.  Keppra switched to Depakote due to possible side effect of Keppra causing confusion.  MRI with and without contrast no acute abnormality  12/2018 admitted for PE.  Was put on heparin drip and then switched to Eliquis.  Follow [facility] Jannifer [facility].  Possible CAA ( cerebral amyloid angiopathy )  Lobar ICH in 10/2018 and this admission  Hypertensive in 10/2018 but no significant hypertension this admission  MRI in 10/2018 did not show clear CAA but this admission MRI concerning for possible CAA  Given patient recurrent lobar ICH and possible CAA, anticoagulation may be discontinued.  However, patient had A. fib and developed a PE in 12/2018 without anticoagulation, making anticoagulation necessary.    Clinical dilemma for antithrombotic decision.  Discussed with wife and patient, they would like to revisit this issue at the time of hematoma resolution, likely at outpatient follow-up.  PAF Hx of PE in the absence of anticoagulation 12/2018  On eliquis 5mg  bid PTA  Currently of AC due to [place]  Clinical dilemma for antithrombotic decision.  Discussed with wife and patient, they would like to revisit this issue at the time of hematoma resolution, likely at outpatient follow-up.  Hypertension  Home BP meds: Cozaar ; Coreg   Current BP meds: Cozaar ; Coreg resumed  Stable now  On cardene,  taper off if able . SBP goal < 160 mm Hg initially . Long-term BP goal normotensive  Hyperlipidemia  Home Lipid lowering medication: Lipitor 10 mg daily   LDL - 76, goal < 70  Currently lipitor on hold given acute ICH  Continue statin at discharge  Diabetes  Home diabetic meds: metformin  Current diabetic meds: none  HgbA1c 6.3, goal < 7.0  SSI  CBG monitoring  Close PCP follow up  Tobacco abuse  Current smoker  Smoking cessation counseling provided  Pt is willing to quit  Other Stroke Risk Factors  Advanced age  Previous ETOH use  Obesity, Body mass index is 33.95 kg/m., recommend weight loss, diet and exercise as appropriate   Family hx stroke (mother)   OSA on CPAP  CHF hx  Other Active Problems  Code status - Full code  Aneurysmal dilatation of the  ascending aorta up to 4.1 cm, stable. Recommend annual imaging followup by CTA or MRA   Aortic Atherosclerosis (ICD10-I70.0) Emphysema (AUQ33-H54.9)  CKD - stage 3a - creatinine - 1.35->1.40->1.38->1.50 Hypokalemia K 3.4-supplement ->3.7 Pt and wife declined [facility] day # 2  Patient presented in July 2020 with this similar hemorrhagic infarct in left frontal lobe and now presents with right occipital hemorrhagic infarct in the setting of A. fib and anticoagulation.  He presents a treatment dilemma is clearly 2 separate anticoagulants have failed to prevent further strokes and have led to hemorrhagic complications.  I feel he may benefit with consideration for watchman device following which she will need only short-term anticoagulation.  This can be addressed as an outpatient by Dr. [name] and Burt Knack.  Long discussion with patient about treatment options for A. fib and hemorrhagic strokes and anticoagulation risk-benefit and answered questions.  Await transfer to inpatient rehab over the next few days when bed available greater than 50% time during this 35-minute visit was spent on  counseling and coordination of care about his stroke and discussion of risk benefits of anticoagulation and alternative treatment options including watchman device.  Antony Contras, MD      To contact Stroke Continuity provider, please refer to [email or web address] After hours, contact General Neurology

## 2019-12-19 NOTE — Progress Notes (Signed)
Physical Therapy Treatment Patient Details Name: Ronald [name] MRN: [id number] DOB: 06/15/1945 Today's Date: 12/19/2019    History of Present Illness Ronald [name] is a 74 y.o. male with a past medical history significant for hypertension, hyperlipidemia, atrial fibrillation, COPD, left frontal hemorrhagic stroke (February 2020, mild residual right leg weakness, complicated by seizure--went to CIR), unprovoked pulmonary embolus, and ongoing tobacco abuse. CT: Acute intraparenchymal hemorrhage centered at the rightparieto-occipital region. Mild surrounding vasogenic edema withoutsignificant regional mass effect.    PT Comments    Patient remains motivated and works hard with physical therapy. He states subjectively his vision is improving as he feels that he can recognize more items in the room than yesterday. Ambulating with several instances of min assist for balance. Verbal cues to identify objects in his way to avoid bumping into them. Recommend continued skilled PT focusing on acute rehab goals.   Follow Up Recommendations  CIR     Equipment Recommendations  None recommended by PT    Recommendations for Other Services Rehab consult     Precautions / Restrictions Precautions Precautions: Fall;Other (comment) (Impaired vision) Restrictions Weight Bearing Restrictions: No    Mobility  Bed Mobility Overal bed mobility: Needs Assistance Bed Mobility: Supine to Sit     Supine to sit: Supervision;HOB elevated     General bed mobility comments: +rail, supervision for safety  Transfers Overall transfer level: Needs assistance Equipment used: 1 person hand held assist Transfers: Sit to/from Stand Sit to Stand: Min assist         General transfer comment: assist to stabilize balance  Ambulation/Gait Ambulation/Gait assistance: Min assist Gait Distance (Feet): 150 Feet Assistive device: 1 person hand held assist Gait Pattern/deviations: Step-through  pattern;Decreased stride length;Staggering left;Staggering right;Drifts right/left Gait velocity: decreased   General Gait Details: HHA on left side today. Having patient read various signs on both sides (better Rt>Lt.) LOB while ambulating and trying to read signs. Improved with stopping to read. Min assist for balance intermittently. Cues for awareness.   Stairs             Wheelchair Mobility    Modified Rankin (Stroke Patients Only) Modified Rankin (Stroke Patients Only) Pre-Morbid Rankin Score: No significant disability Modified Rankin: Moderately severe disability     Balance Overall balance assessment: Needs assistance Sitting-balance support: No upper extremity supported;Feet supported Sitting balance-Leahy Scale: Good     Standing balance support: No upper extremity supported;During functional activity Standing balance-Leahy Scale: Fair                              Cognition Arousal/Alertness: Awake/alert Behavior During Therapy: WFL for tasks assessed/performed Overall Cognitive Status: Impaired/Different from baseline Area of Impairment: Memory                     Memory: Decreased short-term memory         General Comments: Thought he was sitting in recliner but was in bed. Difficulty recalling which room was his.      Exercises      General Comments General comments (skin integrity, edema, etc.): visual deficits affecting balance/mobility      Pertinent Vitals/Pain Pain Assessment: No/denies pain    Home Living                      Prior Function            PT Goals (current goals  can now be found in the care plan section) Acute Rehab PT Goals Patient Stated Goal: to be able to see/walk better PT Goal Formulation: With patient/family Time For Goal Achievement: 01/01/20 Potential to Achieve Goals: Good Progress towards PT goals: Progressing toward goals    Frequency    Min 4X/week      PT Plan  Current plan remains appropriate    Co-evaluation              AM-PAC PT "6 Clicks" Mobility   Outcome Measure  Help needed turning from your back to your side while in a flat bed without using bedrails?: None Help needed moving from lying on your back to sitting on the side of a flat bed without using bedrails?: None Help needed moving to and from a bed to a chair (including a wheelchair)?: A Little Help needed standing up from a chair using your arms (e.g., wheelchair or bedside chair)?: A Little Help needed to walk in hospital room?: A Little Help needed climbing 3-5 steps with a railing? : A Lot 6 Click Score: 19    End of Session Equipment Utilized During Treatment: Gait belt Activity Tolerance: Patient tolerated treatment well Patient left: in chair;with chair alarm set;with call bell/phone within reach;with SCD's reapplied Nurse Communication: Mobility status PT Visit Diagnosis: Unsteadiness on feet (R26.81);Difficulty in walking, not elsewhere classified (R26.2)     Time: 3785-8850 PT Time Calculation (min) (ACUTE ONLY): 28 min  Charges:  $Gait Training: 8-22 mins $Therapeutic Activity: 8-22 mins                     Elayne Snare, PT, DPT   Ellouise Newer 12/19/2019, 4:13 PM

## 2019-12-19 NOTE — Progress Notes (Signed)
Patient stated he did not want to wear CPAP HS tonight. Patient in no distress at this time.

## 2019-12-20 LAB — URINE CULTURE: Culture: >=100000 — AB

## 2019-12-20 LAB — COMPREHENSIVE METABOLIC PANEL
ALT: 11 U/L (ref 0–44)
AST: 13 U/L — ABNORMAL LOW (ref 15–41)
Albumin: 3.4 g/dL — ABNORMAL LOW (ref 3.5–5.0)
Alkaline Phosphatase: 52 U/L (ref 38–126)
Anion gap: 11 (ref 5–15)
BUN: 20 mg/dL (ref 8–23)
CO2: 25 mmol/L (ref 22–32)
Calcium: 8.8 mg/dL — ABNORMAL LOW (ref 8.9–10.3)
Chloride: 99 mmol/L (ref 98–111)
Creatinine, Ser: 1.37 mg/dL — ABNORMAL HIGH (ref 0.61–1.24)
GFR calc Af Amer: 58 mL/min — ABNORMAL LOW (ref >60)
GFR calc non Af Amer: 50 mL/min — ABNORMAL LOW (ref >60)
Glucose, Bld: 131 mg/dL — ABNORMAL HIGH (ref 70–99)
Potassium: 3.3 mmol/L — ABNORMAL LOW (ref 3.5–5.1)
Sodium: 135 mmol/L (ref 135–145)
Total Bilirubin: 1.1 mg/dL (ref 0.3–1.2)
Total Protein: 5.7 g/dL — ABNORMAL LOW (ref 6.5–8.1)

## 2019-12-20 LAB — CBC
HCT: 45.6 % (ref 39.0–52.0)
Hemoglobin: 15.6 g/dL (ref 13.0–17.0)
MCH: 32.3 pg (ref 26.0–34.0)
MCHC: 34.2 g/dL (ref 30.0–36.0)
MCV: 94.4 fL (ref 80.0–100.0)
Platelets: 184 10*3/uL (ref 150–400)
RBC: 4.83 MIL/uL (ref 4.22–5.81)
RDW: 13.4 % (ref 11.5–15.5)
WBC: 7.1 10*3/uL (ref 4.0–10.5)
nRBC: 0 % (ref 0.0–0.2)

## 2019-12-20 LAB — VALPROIC ACID LEVEL: Valproic Acid Lvl: 49 ug/mL — ABNORMAL LOW (ref 50.0–100.0)

## 2019-12-20 MED ORDER — NICOTINE 14 MG/24HR TD PT24
14.0000 mg | MEDICATED_PATCH | Freq: Every day | TRANSDERMAL | Status: DC
  Administered 2019-12-20 – 2019-12-23 (×4): 14 mg via TRANSDERMAL
  Filled 2019-12-20 (×4): qty 1

## 2019-12-20 NOTE — Progress Notes (Signed)
Placed patient on CPAP for the night.  

## 2019-12-20 NOTE — Progress Notes (Signed)
STROKE TEAM PROGRESS NOTE   INTERVAL HISTORY Patient is sitting up in bed.  Marland Kitchen His wife is at the bedside.  He still has persistent left-sided visual field deficits.  Physical therapist recommend inpatient rehab.  Vital signs are stable.  No neurological changes. OBJECTIVE Vitals:   12/19/19 1654 12/19/19 2004 12/19/19 2313 12/20/19 0331  BP: (!) 142/91 (!) 168/97 (!) 160/88 (!) 165/94  Pulse: 64 73 67 75  Resp: 18 18 19 18   Temp: 98.8 F (37.1 C) 97.7 F (36.5 C) 97.9 F (36.6 C) 98.4 F (36.9 C)  TempSrc: Oral   Oral  SpO2: 98% 93% 94% 92%  Weight:      Height:        CBC:  Recent Labs  Lab 12/17/19 2003 12/17/19 2012 12/19/19 0313 12/20/19 0133  WBC 6.6   < > 6.6 7.1  NEUTROABS 4.3  --   --   --   HGB 15.6   < > 14.9 15.6  HCT 47.1   < > 45.1 45.6  MCV 96.3   < > 94.9 94.4  PLT 205   < > 180 184   < > = values in this interval not displayed.    Basic Metabolic Panel:  Recent Labs  Lab 12/19/19 0313 12/20/19 0133  NA 138 135  K 3.7 3.3*  CL 101 99  CO2 28 25  GLUCOSE 106* 131*  BUN 13 20  CREATININE 1.50* 1.37*  CALCIUM 9.0 8.8*    Lipid Panel:     Component Value Date/Time   CHOL 125 12/18/2019 0644   TRIG 97 12/18/2019 0644   HDL 30 (L) 12/18/2019 0644   CHOLHDL 4.2 12/18/2019 0644   VLDL 19 12/18/2019 0644   LDLCALC 76 12/18/2019 0644   HgbA1c:  Lab Results  Component Value Date   HGBA1C 6.3 (H) 12/18/2019   Urine Drug Screen:     Component Value Date/Time   LABOPIA NONE DETECTED 12/17/2019 2102   COCAINSCRNUR NONE DETECTED 12/17/2019 2102   LABBENZ NONE DETECTED 12/17/2019 2102   AMPHETMU NONE DETECTED 12/17/2019 2102   THCU NONE DETECTED 12/17/2019 2102   LABBARB NONE DETECTED 12/17/2019 2102    Alcohol Level     Component Value Date/Time   ETH <10 12/17/2019 2003    IMAGING  CT Code Stroke CTA Head W/WO contrast CT Code Stroke CTA Neck W/WO contrast 12/17/2019 IMPRESSION:  1. Stable CTA of the head and neck. No large vessel  occlusion.  2. No aneurysm or evidence for vasculitis. No vascular abnormality seen underlying the right cerebral hemorrhage.  3. Scattered atherosclerotic calcifications about the proximal right and distal vertebral arteries with associated mild multifocal stenoses, stable.  4. Aneurysmal dilatation of the ascending aorta up to 4.1 cm, stable. Recommend annual imaging followup by CTA or MRA. This recommendation follows 2010 ACCF/AHA/AATS/ACR/ASA/SCA/SCAI/SIR/STS/SVM Guidelines for the Diagnosis and Management of Patients with Thoracic Aortic Disease. Circulation. 2010; 121: F093-A355. Aortic aneurysm NOS (ICD10-I71.9)  5. Aortic Atherosclerosis (ICD10-I70.0) and Emphysema (ICD10-J43.9).   CT HEAD WO CONTRAST 12/18/2019 IMPRESSION:  Stable right occipital hemorrhage measuring up to 4 cm. Stable surrounding vasogenic edema. Atrophy, chronic small vessel disease.   MR BRAIN WO CONTRAST 12/18/2019 IMPRESSION:  1. No significant interval change in the acute intraparenchymal right occipital hemorrhage. Mild surrounding edema without significant regional mass effect.  2. 1 cm acute ischemic nonhemorrhagic infarct involving the subcortical left frontal lobe.  3. Innumerable chronic micro hemorrhages throughout both cerebral hemispheres and cerebellum, progressed as  compared to 2020. Findings are most suspicious for possible cerebral amyloid angiopathy given distribution. Sequelae of poorly controlled hypertension would be the primary differential consideration.  4. Underlying mild chronic microvascular ischemic disease with chronic right frontal encephalomalacia.   DG Chest Port 1 View 12/17/2019 IMPRESSION:  Stable cardiomegaly. No acute chest findings.    CT HEAD CODE STROKE WO CONTRAST 12/17/2019 IMPRESSION:  1. 21 cc acute intraparenchymal hemorrhage centered at the right parieto-occipital region. Mild surrounding vasogenic edema without significant regional mass effect.  2. Underlying atrophy  with chronic small vessel ischemic disease with chronic right frontal encephalomalacia.   Transthoracic Echocardiogram  1. Left ventricular ejection fraction, by estimation, is 60 to 65%. The  left ventricle has normal function. The left ventricle has no regional  wall motion abnormalities. There is severe asymmetric left ventricular  hypertrophy. Left ventricular diastolic  parameters are indeterminate.  2. Right ventricular systolic function is normal. The right ventricular  size is normal.  3. The mitral valve is normal in structure. No evidence of mitral valve  regurgitation.  4. The aortic valve is normal in structure. Aortic valve regurgitation is  not visualized. No aortic stenosis is present.  5. Aortic dilatation noted. There is moderate dilatation of the ascending  aorta, measuring 40 mm.   ECG - SR rate 77 BPM. (See cardiology reading for complete details)   PHYSICAL EXAM  Temp:  [97.7 F (36.5 C)-98.9 F (37.2 C)] 98.4 F (36.9 C) (09/12 0331) Pulse Rate:  [51-75] 75 (09/12 0331) Resp:  [18-19] 18 (09/12 0331) BP: (142-168)/(81-97) 165/94 (09/12 0331) SpO2:  [92 %-98 %] 92 % (09/12 0331)  General -obese elderly Caucasian male not in distress. Ophthalmologic - fundi not visualized due to noncooperation.  Cardiovascular - Regular rhythm and rate.  Neurological Exam ;  Awake  Alert oriented x 3. Normal speech and language.eye movements full without nystagmus.fundi were not visualized. Vision acuity  appears normal.  But dense left homonymous hemianopsia.  Hearing is normal. Palatal movements are normal. Face symmetric. Tongue midline. Normal strength, tone, reflexes and coordination. Normal sensation. Gait deferred.  ASSESSMENT/PLAN Mr. BRONSEN SERANO is a 74 y.o. male with history of hypertension, hyperlipidemia, COPD, DM, hearing impaired, cardiomyopathy, CHF hx, atrial fibrillation (currently on Eliquis for PE as well), COPD, left frontal hemorrhagic  stroke (February 2020, secondary to Eliquis, mild residual right leg weakness, complicated by seizure (now on Depakote), unprovoked pulmonary embolus (restarted on Eliquis in September 2020), and ongoing tobacco abuse presenting with visual difficulties, severe headache, worsening of his chronic left leg weakness and confusion. He did not receive tPA due to [place].  ICH: acute ICH at the right parieto-occipital region, possible hemorrhagic infarct secondary to Eliquis additional small acute embolic infarct at subcortical left frontal lobe - afib    CT Head - 21 cc acute intraparenchymal hemorrhage centered at the right parieto-occipital region.     CT head - Stable right occipital hemorrhage measuring up to 4 cm. Stable surrounding vasogenic edema.   CTA H&N -  Stable CTA of the head and neck. No large vessel occlusion. No aneurysm or evidence for vasculitis. No vascular abnormality seen underlying the right cerebral hemorrhage.  MRI head -  No significant interval change in the acute intraparenchymal right occipital hemorrhage. 1 cm acute ischemic nonhemorrhagic infarct involving the subcortical left frontal lobe. Innumerable chronic micro hemorrhages throughout both cerebral hemispheres and cerebellum, progressed as compared to 2020. Findings are most suspicious for possible cerebral amyloid angiopathy given distribution.  Sequelae of poorly controlled hypertension would be the primary differential consideration.   2D Echo EF 60-65%  [employer] Virus 2 - negative  LDL 76  HgbA1c 6.3  UDS - negative  VTE prophylaxis - SCDs  Eliquis (apixaban) daily prior to admission, now on No antithrombotic due to [place]  On Depakote, continue - depakote level 56 (therapeutic range 50 - 100)  Ongoing aggressive stroke risk factor management  Therapy recommendations:  Possible CIR candidate  Disposition:  Pending  Hx of ICH and seizure  10/2018 admitted for right frontal ICH in the setting of Eliquis.   Status post Kcentra reversal.  CTA head and neck no LVO.  CTV negative.  MRI right parafalcine frontal ICH with edema and local mass-effect.  No CAA identified.  EF more than 65%.  EEG normal.  LDL 77 and A1c 6.9.  Had GTC seizure on presentation, loaded with Keppra.  EEG normal.  Initially BP was high and treated with Cleviprex later tapered off.  He was discharged with aspirin 81 and Keppra.  Resume Lipitor 10.  Patient and wife refused SATURN trial.  11/2018 admitted for seizure.  Keppra switched to Depakote due to possible side effect of Keppra causing confusion.  MRI with and without contrast no acute abnormality  12/2018 admitted for PE.  Was put on heparin drip and then switched to Eliquis.  Follow with Dr. Jannifer Franklin at [facility].  Possible CAA ( cerebral amyloid angiopathy )  Lobar ICH in 10/2018 and this admission  Hypertensive in 10/2018 but no significant hypertension this admission  MRI in 10/2018 did not show clear CAA but this admission MRI concerning for possible CAA  Given patient recurrent lobar ICH and possible CAA, anticoagulation may be discontinued.  However, patient had A. fib and developed a PE in 12/2018 without anticoagulation, making anticoagulation necessary.    Clinical dilemma for antithrombotic decision.  Discussed with wife and patient, they would like to revisit this issue at the time of hematoma resolution, likely at outpatient follow-up.  PAF Hx of PE in the absence of anticoagulation 12/2018  On eliquis 5mg  bid PTA  Currently of AC due to [place]  Clinical dilemma for antithrombotic decision.  Discussed with wife and patient, they would like to revisit this issue at the time of hematoma resolution, likely at outpatient follow-up.  Hypertension  Home BP meds: Cozaar ; Coreg   Current BP meds: Cozaar ; Coreg resumed  Stable now  On cardene, taper off if able . SBP goal < 160 mm Hg initially . Long-term BP goal normotensive  Hyperlipidemia  Home Lipid lowering  medication: Lipitor 10 mg daily   LDL - 76, goal < 70  Currently lipitor on hold given acute ICH  Continue statin at discharge  Diabetes  Home diabetic meds: metformin  Current diabetic meds: none  HgbA1c 6.3, goal < 7.0  SSI  CBG monitoring  Close PCP follow up  Tobacco abuse  Current smoker  Smoking cessation counseling provided  Pt is willing to quit  Other Stroke Risk Factors  Advanced age  Previous ETOH use  Obesity, Body mass index is 33.95 kg/m., recommend weight loss, diet and exercise as appropriate   Family hx stroke (mother)   OSA on CPAP  CHF hx  Other Active Problems  Code status - Full code  Aneurysmal dilatation of the ascending aorta up to 4.1 cm, stable. Recommend annual imaging followup by CTA or MRA   Aortic Atherosclerosis (ICD10-I70.0) Emphysema (OYD74-J28.9)  CKD -  stage 3a - creatinine - 1.35->1.40->1.38->1.50 Hypokalemia K 3.4-supplement ->3.7 Pt and wife declined [facility] day # 3  Patient presented in July 2020 with this similar hemorrhagic infarct in left frontal lobe and now presents with right occipital hemorrhagic infarct in the setting of A. fib and anticoagulation.  He presents a treatment dilemma as clearly 2 separate anticoagulants have failed to prevent further strokes and have led to hemorrhagic complications.  I feel he may benefit with consideration for watchman device following which he will need only short-term anticoagulation.  This can be addressed as an outpatient by Dr. [name] and Burt Knack.  Long discussion with patient and his wife about treatment options for A. fib and hemorrhagic strokes and anticoagulation risk-benefit and answered questions.  Await transfer to inpatient rehab over the next few days when bed available greater than 50% time during this 25-minute visit was spent on counseling and coordination of care about his stroke and discussion of risk benefits of anticoagulation and alternative  treatment options including watchman device.  Antony Contras. MD      To contact Stroke Continuity provider, please refer to [email or web address] After hours, contact General Neurology

## 2019-12-21 DIAGNOSIS — I611 Nontraumatic intracerebral hemorrhage in hemisphere, cortical: Principal | ICD-10-CM

## 2019-12-21 LAB — CBC
HCT: 44.7 % (ref 39.0–52.0)
Hemoglobin: 15.2 g/dL (ref 13.0–17.0)
MCH: 32.2 pg (ref 26.0–34.0)
MCHC: 34 g/dL (ref 30.0–36.0)
MCV: 94.7 fL (ref 80.0–100.0)
Platelets: 177 10*3/uL (ref 150–400)
RBC: 4.72 MIL/uL (ref 4.22–5.81)
RDW: 13.2 % (ref 11.5–15.5)
WBC: 6.4 10*3/uL (ref 4.0–10.5)
nRBC: 0 % (ref 0.0–0.2)

## 2019-12-21 LAB — COMPREHENSIVE METABOLIC PANEL
ALT: 8 U/L (ref 0–44)
AST: 12 U/L — ABNORMAL LOW (ref 15–41)
Albumin: 3.4 g/dL — ABNORMAL LOW (ref 3.5–5.0)
Alkaline Phosphatase: 53 U/L (ref 38–126)
Anion gap: 12 (ref 5–15)
BUN: 21 mg/dL (ref 8–23)
CO2: 24 mmol/L (ref 22–32)
Calcium: 8.8 mg/dL — ABNORMAL LOW (ref 8.9–10.3)
Chloride: 97 mmol/L — ABNORMAL LOW (ref 98–111)
Creatinine, Ser: 1.38 mg/dL — ABNORMAL HIGH (ref 0.61–1.24)
GFR calc Af Amer: 58 mL/min — ABNORMAL LOW (ref >60)
GFR calc non Af Amer: 50 mL/min — ABNORMAL LOW (ref >60)
Glucose, Bld: 119 mg/dL — ABNORMAL HIGH (ref 70–99)
Potassium: 3.4 mmol/L — ABNORMAL LOW (ref 3.5–5.1)
Sodium: 133 mmol/L — ABNORMAL LOW (ref 135–145)
Total Bilirubin: 1.1 mg/dL (ref 0.3–1.2)
Total Protein: 5.8 g/dL — ABNORMAL LOW (ref 6.5–8.1)

## 2019-12-21 LAB — VALPROIC ACID LEVEL: Valproic Acid Lvl: 54 ug/mL (ref 50.0–100.0)

## 2019-12-21 NOTE — TOC Progression Note (Addendum)
Transition of Care [facility]) - Progression Note    Patient Details  Name: YIGIT NORKUS MRN: [id number] Date of Birth: [birth year]  [facility] CM/SW [place] Phone Number: 12/21/2019, 2:08 PM  Clinical Narrative:    MSW spoke was informed that there are no beds available at CIR at this time. Pt and family member were advised at  Bedside of further options including, HH, alternate rehabs, and SNF. Pt and family member decided on [facility], but would like to go home with [facility] if no beds at [facility]. MSW spoke with Pam at [facility], and a referral was sent. MSW will continue to follow for DC needs. Wife, [name], would like to be contacted when [facility] accepts pt. [phone].  Expected Discharge Plan: IP Rehab Facility Barriers to Discharge: Continued Medical Work up  Expected Discharge Plan and Services Expected Discharge Plan: [place]       Living arrangements for the past 2 months: Single Family Home                                       Social Determinants of Health (SDOH) Interventions    Readmission Risk Interventions No flowsheet data found.

## 2019-12-21 NOTE — Progress Notes (Signed)
Physical Therapy Treatment Patient Details Name: Ronald [name] MRN: [id number] DOB: 08/23/1945 Today's Date: 12/21/2019    History of Present Illness Ronald [name] is a 74 y.o. male with a past medical history significant for hypertension, hyperlipidemia, atrial fibrillation, COPD, left frontal hemorrhagic stroke (February 2020, mild residual right leg weakness, complicated by seizure--went to CIR), unprovoked pulmonary embolus, and ongoing tobacco abuse. CT: Acute intraparenchymal hemorrhage centered at the rightparieto-occipital region. Mild surrounding vasogenic edema withoutsignificant regional mass effect.    PT Comments    Patient progressing slowly towards PT goals. Continues to have visual deficits impacting balance and mobility. Tolerated gait training with close min guard-Min A for safety due to instability with head turns/scanning. May try RW next session to see if this helps improve overall safety with mobility. Difficulty finding things in left visual field requiring cues for awareness and compensation. Requires Min A for stair training and assist to find left hand rail. Pt frustrated he has not been up walking in a few days and eager to get to rehab. HR bradycardic in 50s. Will follow.   Follow Up Recommendations  CIR     Equipment Recommendations  None recommended by PT    Recommendations for Other Services       Precautions / Restrictions Precautions Precautions: Fall;Other (comment) Precaution Comments: impaired vision (only can see out of 1/4 of each eye --rt upper quadrant) Restrictions Weight Bearing Restrictions: No    Mobility  Bed Mobility               General bed mobility comments: Up in chair upon PT arrival.  Transfers Overall transfer level: Needs assistance Equipment used: None Transfers: Sit to/from Stand Sit to Stand: Min guard;Min assist         General transfer comment: Min guard for safety. Min A at times due to posterior  lean/LOB on a few occasions when scanning environment.  Ambulation/Gait Ambulation/Gait assistance: Min assist Gait Distance (Feet): 150 Feet Assistive device: None Gait Pattern/deviations: Step-through pattern;Decreased stride length;Staggering left;Staggering right;Drifts right/left Gait velocity: decreased Gait velocity interpretation: <1.31 ft/sec, indicative of household ambulator General Gait Details: Slow, mildly unsteady gait with instability noted esp with head turns and scanning environment. Having pt read various signs down hallways to get pt to attend/find things in left visual field. Min A for balance and cues for awareness.   Stairs Stairs: Yes Stairs assistance: Min guard;Min assist Stair Management: Alternating pattern;Two rails Number of Stairs: 2 (+3 steps x2) General stair comments: Needs cues to place LUE on left rail as pt not able to see it; cues for technique.   Wheelchair Mobility    Modified Rankin (Stroke Patients Only) Modified Rankin (Stroke Patients Only) Pre-Morbid Rankin Score: No significant disability Modified Rankin: Moderately severe disability     Balance Overall balance assessment: Needs assistance Sitting-balance support: No upper extremity supported;Feet supported Sitting balance-Leahy Scale: Good     Standing balance support: During functional activity Standing balance-Leahy Scale: Fair Standing balance comment: Swaying with static standing needing Min A at times to stabilize.                            Cognition Arousal/Alertness: Awake/alert Behavior During Therapy: WFL for tasks assessed/performed Overall Cognitive Status: Impaired/Different from baseline Area of Impairment: Safety/judgement                     Memory: Decreased short-term memory   Safety/Judgement: Decreased  awareness of safety     General Comments: due to vision, working on becoming more aware and learning to acclimate/turn his head to  compensate for left visual field but sometimes needs cues to scan environment.      Exercises      General Comments        Pertinent Vitals/Pain Pain Assessment: No/denies pain    Home Living     Available Help at Discharge: Family;Available 24 hours/day Type of Home: House              Prior Function            PT Goals (current goals can now be found in the care plan section) Progress towards PT goals: Progressing toward goals    Frequency    Min 4X/week      PT Plan Current plan remains appropriate    Co-evaluation              AM-PAC PT "6 Clicks" Mobility   Outcome Measure  Help needed turning from your back to your side while in a flat bed without using bedrails?: None Help needed moving from lying on your back to sitting on the side of a flat bed without using bedrails?: None Help needed moving to and from a bed to a chair (including a wheelchair)?: A Little Help needed standing up from a chair using your arms (e.g., wheelchair or bedside chair)?: A Little Help needed to walk in hospital room?: A Little Help needed climbing 3-5 steps with a railing? : A Little 6 Click Score: 20    End of Session Equipment Utilized During Treatment: Gait belt Activity Tolerance: Patient tolerated treatment well Patient left: Other (comment) (standing at sink with OT) Nurse Communication: Mobility status PT Visit Diagnosis: Unsteadiness on feet (R26.81);Difficulty in walking, not elsewhere classified (R26.2)     Time: 8242-3536 PT Time Calculation (min) (ACUTE ONLY): 19 min  Charges:  $Gait Training: 8-22 mins                     Marisa Severin, PT, DPT Acute Rehabilitation Services Pager [phone] Office [place] 12/21/2019, 12:53 PM

## 2019-12-21 NOTE — Consult Note (Signed)
ELECTROPHYSIOLOGY CONSULT NOTE    Patient ID: Ronald [name] MRN: [id number], DOB/AGE: 1946-04-06 74 y.o.  Admit date: 12/17/2019 Date of Consult: 12/21/2019  Primary Physician: Ronald Post, MD  Electrophysiologist: Ronald [name]  HPI:  Ronald [name] is a 74 y.o. male with a history of hemorrhagic stroke (February 2020 while on Eliquis), PE, HTN, DM, HLD, atrial fibrillation who is being seen today to discuss possible Watchman device candidacy at the request of Dr Ronald [name]. He was admitted 9/9 after he had a visual change and headache. He sought care and there was a 21cc R occipital intraparenchymal hemorrhage. Ronald [name] was given and his BP was controlled in the ER. His history includes prior ICH c/b seizures now treated with Keppra.   He denies chest pain, palpitations, dyspnea, PND, orthopnea, nausea, vomiting, edema, weight gain, or early satiety.  Past Medical History:  Diagnosis Date  . Allergic rhinitis   . Anticoagulant long-term use    eliquis  . Atrial fibrillation ([place])   . Cardiomyopathy due to systemic disease [facility])    followed by dr harding  . COPD with emphysema [facility])    pulmologist-  dr Halford Chessman  . Dyspnea    occasional per pt  . Heart disease   . History of colon polyps    tubular adenoma 2013  . History of gout    09-05-2017 last flare-up  05/ 2019 3 wks ago, feet  . History of sepsis 02/18/2017   per d/c note probable uti, acute chf, acute renal failure, hypoxia  . History of YAG laser capsulotomy of lens December 2020, January 2021  . Hyperlipidemia   . Hyperplasia of prostate with lower urinary tract symptoms (LUTS)   . Hypertension   . OSA on CPAP    per study 08-03-2004  Severe OSA  . Persistent atrial fibrillation [facility])    cardiologist --  dr Dorris Carnes--  [name] cardioversion 05-07-2014  . Prostate cancer [facility]) urologsit-  dr Alyson Ingles--- as of 05-21-2017 per pt last PSA 11 approx.   Dx  2014--  stage T1c, Gleason 3+3=6, PSA 6.67--  Active  surveillance/  04/ 2019  Stage T1b, Gleason 3+4, PSA 12.8- plan external radiation therapy    . 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 ([place])    pulmologist-  dr Halford Chessman  . Seasonal allergies   . Sigmoid diverticulosis   . Systolic and diastolic CHF, chronic [facility])    cardiologist-  [name]  . Tinea versicolor   . Type 2 diabetes mellitus ([place])   . Wears hearing aid in both ears      Surgical History:  Past Surgical History:  Procedure Laterality Date  . CARDIOVERSION N/A 05/07/2014   Procedure: CARDIOVERSION;  Surgeon: [name], MD;  Location: [facility] ENDOSCOPY;  Service: Cardiovascular;  Laterality: N/A;  . CARDIOVERSION N/A 09/09/2014   Procedure: CARDIOVERSION;  Surgeon: Lelon Perla, MD;  Location: [facility] ENDOSCOPY;  Service: Cardiovascular;  Laterality: N/A;  successfully  . CATARACT EXTRACTION W/ INTRAOCULAR LENS  IMPLANT, BILATERAL  08 and 09/  2018  . COLONOSCOPY  last one 06-07-2011  . GOLD SEED IMPLANT N/A 09/09/2017   Procedure: GOLD SEED IMPLANT;  Surgeon: [name], MD;  Location: [facility];  Service: Urology;  Laterality: N/A;  . HYDROCELE EXCISION Bilateral 09/26/2015   Procedure: HYDROCELECTOMY ADULT;  Surgeon: [name], MD;  Location: [facility];  Service: Urology;  Laterality: Bilateral;  . LAMINECTOMY AND MICRODISCECTOMY LUMBAR SPINE  12-23-2003   Left L5 -- S1  . LAPAROSCOPIC BILATERAL INGUINAL HERNIA REPAIR/  UMBILICAL HERNIA REPAIR WITH MESH/  ASPIRATION LEFT HYDROCELE  07-11-2012  . NM MYOVIEW LTD  05/18/2014   Low risk study. Normal perfusion: No ischemia or infarction. Mild LV dysfunction - 46% (does not correlate with echocardiographic EF of 50-55%)  . PROSTATE BIOPSY  05/15/12   Clinically both [place]  . SPACE OAR INSTILLATION N/A 09/09/2017   Procedure: SPACE OAR INSTILLATION;  Surgeon: [name], MD;  Location: [facility];  Service: Urology;  Laterality: N/A;  . TEE  WITHOUT CARDIOVERSION N/A 05/07/2014   Procedure: TRANSESOPHAGEAL ECHOCARDIOGRAM (TEE);  Surgeon: [name], MD;  Location[facility] ENDOSCOPY;  Service: Cardiovascular;  Laterality: N/A;   mild atherosclerosis plaque of aorta,  mild AR, MR, and TR,  no cardiac source of emboli  . TONSILLECTOMY  as child  . TRANSTHORACIC ECHOCARDIOGRAM  11/'18; 1/'19   a) In setting of sepsis: EF of 40-45%.  Diffuse hypokinesis.  No RWMA.  Biatrial enlargement.;; b) ** f/u Jan 2019: Normal LVF 55-60%** , mildly dilated aortic root(38 mm) and ascending aorta (44 mm). Compared to prior echo, LVEF has improved.  . TRANSTHORACIC ECHOCARDIOGRAM  7/'20; 9/'20   a) Hemorrhagic CVA/SAH - EF> 65%.  Moderate concentric LVH.-GR 1 DD mild RA dilation.; b) EF 60 to 65%.  Mild LVH.  GRII DD.  Normal RV size and function.  Mild LA dilation.  Normal RA size.  Unable to assess PA pressures.  No significant aortic sclerosis.  . TRANSURETHRAL RESECTION OF PROSTATE N/A 05/30/2017   Procedure: TRANSURETHRAL RESECTION OF THE PROSTATE (TURP);  Surgeon: [name], MD;  Location: [facility] ORS;  Service: Urology;  Laterality: N/A;     Medications Prior to Admission  Medication Sig Dispense Refill Last Dose  . albuterol (VENTOLIN HFA) 108 (90 Base) MCG/ACT inhaler Inhale 2 puffs into the lungs every 6 (six) hours as needed for wheezing or shortness of breath. 1 Inhaler 5 12/17/2019 at am  . amiodarone (PACERONE) 200 MG tablet Take 0.5 tablets (100 mg total) by mouth daily. 90 tablet 2 12/17/2019 at 1100  . ascorbic acid (VITAMIN C) 500 MG tablet Take 500 mg by mouth daily.   12/17/2019 at Unknown time  . atorvastatin (LIPITOR) 10 MG tablet TAKE 1 TABLET BY MOUTH DAILY EACH EVENING (Patient taking differently: Take 10 mg by mouth at bedtime. ) 90 tablet 3 12/16/2019 at pm  . carvedilol (COREG) 6.25 MG tablet Take 1 tablet (6.25 mg total) by mouth 2 (two) times daily with a meal. 180 tablet 3 12/17/2019 at 1100  . Cholecalciferol (VITAMIN D-3 PO) Take 1  capsule by mouth daily.   12/17/2019 at Unknown time  . colchicine 0.6 MG tablet TAKE 1 TABLET (0.6 MG TOTAL) BY MOUTH 2 (TWO) TIMES DAILY AS NEEDED (GOUT FLARE). (Patient taking differently: Take 0.6 mg by mouth See admin instructions. Take 0.6 mg by mouth once a day and an additional 0.6 mg once daily as needed/AS DIRECTED for gout flares) 180 tablet 0 12/17/2019 at Unknown time  . divalproex (DEPAKOTE) 500 MG DR tablet TAKE 1 TABLET BY MOUTH EVERY 12 HOURS (Patient taking differently: Take 500 mg by mouth every 12 (twelve) hours. ) 180 tablet 1 12/17/2019 at 1100  . ELIQUIS 5 MG TABS tablet TAKE 1 TABLET BY MOUTH TWICE A DAY (Patient taking differently: Take 5 mg by mouth 2 (two) times daily. ) 180 tablet 1 12/17/2019 at 1100  .  furosemide (LASIX) 40 MG tablet TAKE 1 TABLET (40 MG TOTAL) BY MOUTH 2 (TWO) TIMES DAILY WITH BREAKFAST AND LUNCH. (Patient taking differently: Take 40 mg by mouth in the morning. ) 180 tablet 1 12/17/2019 at am  . glimepiride (AMARYL) 2 MG tablet Take 1 tablet (2 mg total) by mouth daily with breakfast. 90 tablet 3 12/17/2019 at Unknown time  . losartan (COZAAR) 50 MG tablet Take 1 tablet (50 mg total) by mouth daily. 90 tablet 3 12/17/2019 at Unknown time  . metFORMIN (GLUCOPHAGE) 500 MG tablet TAKE 2 TABLETS (1,000 MG TOTAL) BY MOUTH 2 (TWO) TIMES DAILY WITH A MEAL. (Patient taking differently: Take 1,000 mg by mouth 2 (two) times daily with a meal. ) 360 tablet 1 12/17/2019 at am  . montelukast (SINGULAIR) 10 MG tablet TAKE 1 TABLET BY MOUTH EVERYDAY AT BEDTIME (Patient taking differently: Take 10 mg by mouth at bedtime. ) 90 tablet 1 12/16/2019 at pm  . MYRBETRIQ 25 MG TB24 tablet Take 25 mg by mouth daily.   12/17/2019 at Unknown time  . PRESCRIPTION MEDICATION CPAP- At bedtime   12/16/2019 at pm  . zinc gluconate 50 MG tablet Take 50 mg by mouth daily.   12/17/2019 at Unknown time  . azelastine (ASTELIN) 0.1 % nasal spray Place 1 spray into both nostrils 2 (two) times daily as needed for  rhinitis or allergies. 30 mL 12 unk at unk  . Blood Glucose Monitoring Suppl (ACCU-CHEK AVIVA PLUS) w/Device KIT Use blood glucose machine as instructed on your strips 1 kit 1   . budesonide-formoterol (SYMBICORT) 160-4.5 MCG/ACT inhaler Inhale 2 puffs into the lungs 2 (two) times daily. 1 Inhaler 12 unk at unk  . glucose blood (ACCU-CHEK AVIVA PLUS) test strip Test 2 times daily dx e11.9 100 each 3   . Lancets (ACCU-CHEK SOFT TOUCH) lancets Check blood sugars once per day. DX: E11.9 100 each 2   . Sodium Sulfate-Mag Sulfate-KCl (SUTAB) 5850587010 MG TABS Take 1 kit by mouth as directed. BIN: [id number] PCN: CN GROUP: [id number] MEMBER ID: [id number];BMW AS CASH;NO PRIOR AUTHORIZATION 24 tablet 0 Not yet at Not yet    Inpatient Medications:  .  stroke: mapping our early stages of recovery book   Does not apply Once  . amiodarone  100 mg Oral Daily  . ascorbic acid  500 mg Oral Daily  . carvedilol  6.25 mg Oral BID WC  . divalproex  500 mg Oral Q12H  . furosemide  40 mg Oral Daily  . losartan  50 mg Oral Daily  . mirabegron ER  25 mg Oral Daily  . mometasone-formoterol  2 puff Inhalation BID  . montelukast  10 mg Oral QHS  . nicotine  14 mg Transdermal Daily  . senna-docusate  1 tablet Oral BID    Allergies: No Known Allergies  Social History   Socioeconomic History  . Marital status: Married    Spouse name: Not on file  . Number of children: 2  . Years of education: 16  . Highest education level: Bachelor's degree (e.g., BA, AB, BS)  Occupational History  . Occupation: Scientist, clinical (histocompatibility and immunogenetics): [employer]  Tobacco Use  . Smoking status: Current Every Day Smoker    Packs/day: 1.00    Years: 60.00    Pack years: 60.00    Types: Cigarettes  . Smokeless tobacco: Never Used  . Tobacco comment: 09-05-2017 tried quitting smoking 11/ 2018 but started back at 0.75ppd from 1ppd  Vaping Use  .  Vaping Use: Every day  Substance and Sexual Activity  . Alcohol use: Not Currently  . Drug  use: No  . Sexual activity: Not on file  Other Topics Concern  . Not on file  Social History Narrative   Married 25 years   2 children but not with this wife   Left handed    Likes to read, netflix, tv   Social Determinants of Health   Financial Resource Strain: Low Risk   . Difficulty of Paying Living Expenses: Not hard at all  Food Insecurity: No Food Insecurity  . Worried About Charity fundraiser in the Last Year: Never true  . Ran Out of Food in the Last Year: Never true  Transportation Needs: No Transportation Needs  . Lack of Transportation (Medical): No  . Lack of Transportation (Non-Medical): No  Physical Activity: Inactive  . Days of Exercise per Week: 0 days  . Minutes of Exercise per Session: 0 min  Stress: No Stress Concern Present  . Feeling of Stress : Only a little  Social Connections: Moderately Isolated  . Frequency of Communication with Friends and Family: More than three times a week  . Frequency of Social Gatherings with Friends and Family: Once a week  . Attends Religious Services: Never  . Active Member of Clubs or Organizations: No  . Attends Archivist Meetings: Never  . Marital Status: Married  Human resources officer Violence:   . Fear of Current or Ex-Partner: Not on file  . Emotionally Abused: Not on file  . Physically Abused: Not on file  . Sexually Abused: Not on file     Family History  Problem Relation Age of Onset  . Breast cancer Mother   . Stroke Mother   . Stroke Other        Unknown   . Ovarian cancer Sister   . Colon cancer Neg Hx   . Esophageal cancer Neg Hx   . Rectal cancer Neg Hx   . Stomach cancer Neg Hx      Review of Systems: All other systems reviewed and are otherwise negative except as noted above.  Physical Exam: Vitals:   12/21/19 0213 12/21/19 0324 12/21/19 0900 12/21/19 1201  BP: (!) 156/92 (!) 159/93 137/89 (!) 140/96  Pulse:  66 63 (!) 58  Resp:  '17 18 18  ' Temp:  97.6 F (36.4 C) 98.1 F (36.7 C)  97.9 F (36.6 C)  TempSrc:  Oral Oral Oral  SpO2:  95% 92% 96%  Weight:      Height:        GEN- The patient is awake and alert.   HEENT: oropharynx clear; neck supple Lungs- Clear to ausculation bilaterally, normal work of breathing.  No wheezes, rales, rhonchi Heart- Regular rate and rhythm, no murmurs, rubs or gallops GI- soft, non-tender, non-distended, bowel sounds present. obese Extremities- warm nonedematous MS- no significant deformity or atrophy Skin- warm and dry, no rash or lesion Psych- euthymic mood. Flat affect Neuro- AAO. Moves 4 extremities against gravity  Labs:   Lab Results  Component Value Date   WBC 6.4 12/21/2019   HGB 15.2 12/21/2019   HCT 44.7 12/21/2019   MCV 94.7 12/21/2019   PLT 177 12/21/2019    Recent Labs  Lab 12/21/19 0302  NA 133*  K 3.4*  CL 97*  CO2 24  BUN 21  CREATININE 1.38*  CALCIUM 8.8*  PROT 5.8*  BILITOT 1.1  ALKPHOS 53  ALT 8  AST  12*  GLUCOSE 119*      Radiology/Studies:   January 03, 2019 CT chest with contrast personally reviewed by me CT shows dilated left atrium and a very small left atrial appendage with an ostial diameter between 18 and 20 mm at the level of the left circumflex.  Interatrial septum appears thickened.  December 18, 2019 surface echocardiogram personally reviewed by me Normal left ventricular function.  Thickened interatrial septum.  Left atrial appendage poorly visualized on the surface echo  December 17, 2019 EKG personally reviewed by me shows normal sinus rhythm  Assessment/Plan: 1.  Atrial fibrillation and intracranial hemorrhage Has-bled score of 4 CHA2DS2-VASc of 6. The patient has a long history of atrial fibrillation recently intolerant to oral anticoagulation with multiple episodes of intracranial hemorrhage on different novel anticoagulants.  Given the patient's recurrent episodes of ischemic and hemorrhagic strokes, the patient's family and patient's care team including Dr.  Leonie [name] from neurology have inquired about his eligibility for a watchman device.  I had a long discussion with the patient at the bedside about the procedure and its associated risks and potential benefits.  The patient is interested and I do think he would be a good candidate based on his clinical history.  On my limited review of a CT chest with contrast not protocoled for evaluation of the left atrial appendage (see above), the left atrial appendage diameter appears to be very small.  Before determining his eligibility, will need to repeat a gated CT chest protocoled to evaluate the left atrium and left atrial appendage diameter.  I will have our [place] coordinator reach out to the patient and his family to discuss scheduling this.   I discussed the need for short-term anticoagulation with the patient.  He would need to be on 45 days of an oral anticoagulant after the watchman implant to facilitate endothelialization of the device.  Dr. Leonie [name] advised that we could choose either apixaban or rivaroxaban.  I discussed the obvious risks of restarting anticoagulation with the patient but he is agreeable given his recurrent episodes of stroke.  If the anatomy of the left atrial appendage was suitable for implant and we proceeded, he would be on 45 days of oral anticoagulation plus aspirin.  At the 45-day mark a transesophageal echocardiogram would be performed.  If the device showed a good seal, could stop the anticoagulant and add Plavix to his 81 mg daily aspirin regimen.  He would continue this dual antiplatelet regimen until the 7-monthpost procedure mark.  At that point we could stop the Plavix and continue only aspirin. For now, we will plan to have her watchman coordinator reach out to the patient's family to discuss the procedure in more detail and to have the CT chest scheduled.  We will plan to do this as an outpatient.   For questions or updates, please contact CLickingPlease consult  [email or web address] for contact info under Cardiology/STEMI.  Signed, CLars Mage MD 12/21/2019 3:42 PM

## 2019-12-21 NOTE — Progress Notes (Signed)
Patient stated that he didn't want to wear the CPAP tonight.  Will try again tomorrow night.

## 2019-12-21 NOTE — Consult Note (Signed)
Physical Medicine and Rehabilitation Consult Reason for Consult: Left side vision loss with decreased functional ability and altered mental status Referring Physician: Dr. Leonie Man   HPI: Ronald [name] is a 74 y.o. right-handed male with history of hypertension, hyperlipidemia, atrial fibrillation maintained on Eliquis as well as pulmonary emboli, COPD/tobacco abuse, cardiomyopathy followed by [name], type 2 diabetes mellitus, left frontal hemorrhagic stroke with mild residual right leg weakness complicated by seizures and received inpatient rehab services 11/27/2018 to 12/13/2018.  Per chart review patient lives with spouse.  Independent prior to admission.  Two-level home bed and bath main level and 2 steps to entry.  Presented 12/17/2019 with left-sided vision loss headache as well as confusion.  CT the head showed a 21 cc acute intraparenchymal hemorrhage centered at the right parieto-occipital region.  Mild surrounding vasogenic edema without significant regional mass-effect.  Underlying atrophy chronic small vessel ischemic disease with chronic right frontal encephalomalacia.  CT angiogram of head and neck with no large vessel occlusion.  Aneurysmal dilatation of the ascending aorta up to 4.1 cm stable recommending annual follow-up CTA or MRA.  Follow-up MRI no significant interval change or was a 1 cm acute ischemic nonhemorrhagic infarct involving the subcortical left frontal lobe.  Admission chemistries glucose 118, creatinine 1.35, urine drug screen negative, urine culture greater 100,000 Klebsiella, SARS current virus negative.  Neurology consulted Eliquis currently discontinued due to [place].  Echo with ejection fraction of 60 to 65% no wall motion abnormalities.  Tolerating regular diet.  Therapy evaluations completed with recommendations of physical medicine rehab consult.   Review of Systems  Constitutional: Negative for chills and fever.  HENT: Negative for hearing loss.   Eyes:  Negative for blurred vision and double vision.  Respiratory: Positive for shortness of breath.   Cardiovascular: Positive for palpitations.  Gastrointestinal: Positive for constipation and nausea. Negative for heartburn and vomiting.  Genitourinary: Negative for dysuria, flank pain and hematuria.  Musculoskeletal: Positive for myalgias.  Skin: Negative for rash.  Neurological: Positive for dizziness and headaches.  All other systems reviewed and are negative.  Past Medical History:  Diagnosis Date  . Allergic rhinitis   . Anticoagulant long-term use    eliquis  . Atrial fibrillation ([place])   . Cardiomyopathy due to systemic disease [facility])    followed by dr harding  . COPD with emphysema [facility])    pulmologist-  dr Halford Chessman  . Dyspnea    occasional per pt  . Heart disease   . History of colon polyps    tubular adenoma 2013  . History of gout    09-05-2017 last flare-up  05/ 2019 3 wks ago, feet  . History of sepsis 02/18/2017   per d/c note probable uti, acute chf, acute renal failure, hypoxia  . History of YAG laser capsulotomy of lens December 2020, January 2021  . Hyperlipidemia   . Hyperplasia of prostate with lower urinary tract symptoms (LUTS)   . Hypertension   . OSA on CPAP    per study 08-03-2004  Severe OSA  . Persistent atrial fibrillation [facility])    cardiologist --  dr Dorris Carnes--  post cardioversion 05-07-2014  . Prostate cancer [facility]) urologsit-  dr Alyson Ingles--- as of 05-21-2017 per pt last PSA 11 approx.   Dx  2014--  stage T1c, Gleason 3+3=6, PSA 6.67--  Active surveillance/  04/ 2019  Stage T1b, Gleason 3+4, PSA 12.8- plan external radiation therapy    . 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 Seasonal allergies   . Sigmoid diverticulosis   . Systolic and diastolic CHF, chronic [facility])    cardiologist-  [name]  . Tinea versicolor   . Type 2 diabetes mellitus ([place])   . Wears hearing aid in both ears     Past Surgical History:  Procedure Laterality Date  . CARDIOVERSION N/A 05/07/2014   Procedure: CARDIOVERSION;  Surgeon: [name], MD;  Location: [facility] ENDOSCOPY;  Service: Cardiovascular;  Laterality: N/A;  . CARDIOVERSION N/A 09/09/2014   Procedure: CARDIOVERSION;  Surgeon: Lelon Perla, MD;  Location[facility] ENDOSCOPY;  Service: Cardiovascular;  Laterality: N/A;  successfully  . CATARACT EXTRACTION W/ INTRAOCULAR LENS  IMPLANT, BILATERAL  08 and 09/  2018  . COLONOSCOPY  last one 06-07-2011  . GOLD SEED IMPLANT N/A 09/09/2017   Procedure: GOLD SEED IMPLANT;  Surgeon: [name], MD;  Location: [facility];  Service: Urology;  Laterality: N/A;  . HYDROCELE EXCISION Bilateral 09/26/2015   Procedure: HYDROCELECTOMY ADULT;  Surgeon: [name], MD;  Location: [facility];  Service: Urology;  Laterality: Bilateral;  . LAMINECTOMY AND MICRODISCECTOMY LUMBAR SPINE  12-23-2003   Left L5 -- S1  . LAPAROSCOPIC BILATERAL INGUINAL HERNIA REPAIR/  UMBILICAL HERNIA REPAIR WITH MESH/  ASPIRATION LEFT HYDROCELE  07-11-2012  . NM MYOVIEW LTD  05/18/2014   Low risk study. Normal perfusion: No ischemia or infarction. Mild LV dysfunction - 46% (does not correlate with echocardiographic EF of 50-55%)  . PROSTATE BIOPSY  05/15/12   Clinically both Lobes  . SPACE OAR INSTILLATION N/A 09/09/2017   Procedure: SPACE OAR INSTILLATION;  Surgeon: [name], MD;  Location: [facility];  Service: Urology;  Laterality: N/A;  . TEE WITHOUT CARDIOVERSION N/A 05/07/2014   Procedure: TRANSESOPHAGEAL ECHOCARDIOGRAM (TEE);  Surgeon: [name], MD;  Location: [facility] ENDOSCOPY;  Service: Cardiovascular;  Laterality: N/A;   mild atherosclerosis plaque of aorta,  mild AR, MR, and TR,  no cardiac source of emboli  . TONSILLECTOMY  as child  . TRANSTHORACIC ECHOCARDIOGRAM  11/'18; 1/'19   a) In setting of sepsis: EF of 40-45%.  Diffuse hypokinesis.  No RWMA.  Biatrial  enlargement.;; b) ** f/u Jan 2019: Normal LVF 55-60%** , mildly dilated aortic root(38 mm) and ascending aorta (44 mm). Compared to prior echo, LVEF has improved.  . TRANSTHORACIC ECHOCARDIOGRAM  7/'20; 9/'20   a) Hemorrhagic CVA/SAH - EF> 65%.  Moderate concentric LVH.-GR 1 DD mild RA dilation.; b) EF 60 to 65%.  Mild LVH.  GRII DD.  Normal RV size and function.  Mild LA dilation.  Normal RA size.  Unable to assess PA pressures.  No significant aortic sclerosis.  . TRANSURETHRAL RESECTION OF PROSTATE N/A 05/30/2017   Procedure: TRANSURETHRAL RESECTION OF THE PROSTATE (TURP);  Surgeon: [name], MD;  Location: [facility];  Service: Urology;  Laterality: N/A;   Family History  Problem Relation Age of Onset  . Breast cancer Mother   . Stroke Mother   . Stroke Other        Unknown   . Ovarian cancer Sister   . Colon cancer Neg Hx   . Esophageal cancer Neg Hx   . Rectal cancer Neg Hx   . Stomach cancer Neg Hx    Social History:  reports that he has been smoking cigarettes. He has a 60.00 pack-year smoking history. He has never used  smokeless tobacco. He reports previous alcohol use. He reports that he does not use drugs. Allergies: No Known Allergies Medications Prior to Admission  Medication Sig Dispense Refill  . albuterol (VENTOLIN HFA) 108 (90 Base) MCG/ACT inhaler Inhale 2 puffs into the lungs every 6 (six) hours as needed for wheezing or shortness of breath. 1 Inhaler 5  . amiodarone (PACERONE) 200 MG tablet Take 0.5 tablets (100 mg total) by mouth daily. 90 tablet 2  . ascorbic acid (VITAMIN C) 500 MG tablet Take 500 mg by mouth daily.    Marland Kitchen atorvastatin (LIPITOR) 10 MG tablet TAKE 1 TABLET BY MOUTH DAILY EACH EVENING (Patient taking differently: Take 10 mg by mouth at bedtime. ) 90 tablet 3  . carvedilol (COREG) 6.25 MG tablet Take 1 tablet (6.25 mg total) by mouth 2 (two) times daily with a meal. 180 tablet 3  . Cholecalciferol (VITAMIN D-3 PO) Take 1 capsule by mouth daily.      . colchicine 0.6 MG tablet TAKE 1 TABLET (0.6 MG TOTAL) BY MOUTH 2 (TWO) TIMES DAILY AS NEEDED (GOUT FLARE). (Patient taking differently: Take 0.6 mg by mouth See admin instructions. Take 0.6 mg by mouth once a day and an additional 0.6 mg once daily as needed/AS DIRECTED for gout flares) 180 tablet 0  . divalproex (DEPAKOTE) 500 MG DR tablet TAKE 1 TABLET BY MOUTH EVERY 12 HOURS (Patient taking differently: Take 500 mg by mouth every 12 (twelve) hours. ) 180 tablet 1  . ELIQUIS 5 MG TABS tablet TAKE 1 TABLET BY MOUTH TWICE A DAY (Patient taking differently: Take 5 mg by mouth 2 (two) times daily. ) 180 tablet 1  . furosemide (LASIX) 40 MG tablet TAKE 1 TABLET (40 MG TOTAL) BY MOUTH 2 (TWO) TIMES DAILY WITH BREAKFAST AND LUNCH. (Patient taking differently: Take 40 mg by mouth in the morning. ) 180 tablet 1  . glimepiride (AMARYL) 2 MG tablet Take 1 tablet (2 mg total) by mouth daily with breakfast. 90 tablet 3  . losartan (COZAAR) 50 MG tablet Take 1 tablet (50 mg total) by mouth daily. 90 tablet 3  . metFORMIN (GLUCOPHAGE) 500 MG tablet TAKE 2 TABLETS (1,000 MG TOTAL) BY MOUTH 2 (TWO) TIMES DAILY WITH A MEAL. (Patient taking differently: Take 1,000 mg by mouth 2 (two) times daily with a meal. ) 360 tablet 1  . montelukast (SINGULAIR) 10 MG tablet TAKE 1 TABLET BY MOUTH EVERYDAY AT BEDTIME (Patient taking differently: Take 10 mg by mouth at bedtime. ) 90 tablet 1  . MYRBETRIQ 25 MG TB24 tablet Take 25 mg by mouth daily.    Marland Kitchen PRESCRIPTION MEDICATION CPAP- At bedtime    . zinc gluconate 50 MG tablet Take 50 mg by mouth daily.    Marland Kitchen azelastine (ASTELIN) 0.1 % nasal spray Place 1 spray into both nostrils 2 (two) times daily as needed for rhinitis or allergies. 30 mL 12  . Blood Glucose Monitoring Suppl (ACCU-CHEK AVIVA PLUS) w/Device KIT Use blood glucose machine as instructed on your strips 1 kit 1  . budesonide-formoterol (SYMBICORT) 160-4.5 MCG/ACT inhaler Inhale 2 puffs into the lungs 2 (two) times  daily. 1 Inhaler 12  . glucose blood (ACCU-CHEK AVIVA PLUS) test strip Test 2 times daily dx e11.9 100 each 3  . Lancets (ACCU-CHEK SOFT TOUCH) lancets Check blood sugars once per day. DX: E11.9 100 each 2  . Sodium Sulfate-Mag Sulfate-KCl (SUTAB) [phone] MG TABS Take 1 kit by mouth as directed. BIN: [id number] PCN: CN GROUP:  [id number] MEMBER ID: [id number];HQR AS CASH;NO PRIOR AUTHORIZATION 24 tablet 0    Home: Home Living Family/patient expects to be discharged to:: Private residence Living Arrangements: Spouse/significant other Available Help at Discharge: Family, Available 24 hours/day Type of Home: House Home Access: Stairs to enter [employer] of Steps: 2 Entrance Stairs-Rails: Right, Left, Can reach both Home Layout: Able to live on main level with bedroom/bathroom Alternate Level Stairs-Number of Steps: Building control surveyor Shower/Tub: Multimedia programmer: Handicapped height Bathroom Accessibility: Yes Home Equipment: Environmental consultant - 4 wheels, Cane - single point, Bedside commode, Shower seat  Lives With: Spouse  Functional History: Prior Function Level of Independence: Independent Functional Status:  Mobility: Bed Mobility Overal bed mobility: Needs Assistance Bed Mobility: Supine to Sit Supine to sit: Supervision, HOB elevated General bed mobility comments: +rail, supervision for safety Transfers Overall transfer level: Needs assistance Equipment used: 1 person hand held assist Transfers: Sit to/from Stand Sit to Stand: Min assist General transfer comment: assist to stabilize balance Ambulation/Gait Ambulation/Gait assistance: Min assist Gait Distance (Feet): 150 Feet Assistive device: 1 person hand held assist Gait Pattern/deviations: Step-through pattern, Decreased stride length, Staggering left, Staggering right, Drifts right/left General Gait Details: HHA on left side today. Having patient read various signs on both sides (better Rt>Lt.) LOB  while ambulating and trying to read signs. Improved with stopping to read. Min assist for balance intermittently. Cues for awareness. Gait velocity: decreased Gait velocity interpretation: <1.31 ft/sec, indicative of household ambulator    ADL: ADL Overall ADL's : Needs assistance/impaired Eating/Feeding: Set up, Sitting Grooming: Supervision/safety, Set up, Standing Upper Body Bathing: Set up, Sitting Lower Body Bathing: Minimal assistance, Sit to/from stand Upper Body Dressing : Set up, Sitting Lower Body Dressing: Minimal assistance, Sit to/from stand Toilet Transfer: Minimal assistance, Ambulation Toilet Transfer Details (indicate cue type and reason): Bed>walk in hallway>back to room to sit in recliner Toileting- Clothing Manipulation and Hygiene: Minimal assistance, Sit to/from stand  Cognition: Cognition Overall Cognitive Status: Impaired/Different from baseline Arousal/Alertness: Awake/alert Orientation Level: Oriented X4 Attention: Sustained, Selective Sustained Attention: Appears intact Selective Attention: Impaired Selective Attention Impairment: Verbal basic Memory: Impaired Memory Impairment: Storage deficit, Retrieval deficit Awareness: Impaired Awareness Impairment: Emergent impairment Safety/Judgment: Impaired Cognition Arousal/Alertness: Awake/alert Behavior During Therapy: WFL for tasks assessed/performed Overall Cognitive Status: Impaired/Different from baseline Area of Impairment: Memory Memory: Decreased short-term memory General Comments: Thought he was sitting in recliner but was in bed. Difficulty recalling which room was his.  Blood pressure 137/89, pulse 63, temperature 98.1 F (36.7 C), temperature source Oral, resp. rate 18, height '5\' 11"'  (1.803 m), weight 110.4 kg, SpO2 92 %.  General: Alert and oriented x 3, No apparent distress HEENT: Head is normocephalic, hard of hearing Neck: Supple without JVD or lymphadenopathy Heart: Reg rate and  rhythm. No murmurs rubs or gallops Chest: CTA bilaterally without wheezes, rales, or rhonchi; no distress Abdomen: Soft, non-tender, non-distended, bowel sounds positive. Extremities: No clubbing, cyanosis, or edema. Pulses are 2+ Skin: Clean and intact without signs of breakdown Neuro: Pt is cognitively appropriate with normal insight, memory, and awareness. Cranial nerves 2-12 are intact. Patient is alert no acute distress.  Oriented to person and place.  Follows commands.  Left homonymous hemianopsia. No tremors. Motor function is grossly 5/5.  Psych: Pt's affect is appropriate. Pt is cooperative. Great sense of humor  Results for orders placed or performed during the hospital encounter of 12/17/19 (from the past 24 hour(s))  CBC     Status: None  Collection Time: 12/21/19  3:02 AM  Result Value Ref Range   WBC 6.4 4.0 - 10.5 K/uL   RBC 4.72 4.22 - 5.81 MIL/uL   Hemoglobin 15.2 13.0 - 17.0 g/dL   HCT 44.7 39 - 52 %   MCV 94.7 80.0 - 100.0 fL   MCH 32.2 26.0 - 34.0 pg   MCHC 34.0 30.0 - 36.0 g/dL   RDW 13.2 11.5 - 15.5 %   Platelets 177 150 - 400 K/uL   nRBC 0.0 0.0 - 0.2 %  Comprehensive metabolic panel     Status: Abnormal   Collection Time: 12/21/19  3:02 AM  Result Value Ref Range   Sodium 133 (L) 135 - 145 mmol/L   Potassium 3.4 (L) 3.5 - 5.1 mmol/L   Chloride 97 (L) 98 - 111 mmol/L   CO2 24 22 - 32 mmol/L   Glucose, Bld 119 (H) 70 - 99 mg/dL   BUN 21 8 - 23 mg/dL   Creatinine, Ser 1.38 (H) 0.61 - 1.24 mg/dL   Calcium 8.8 (L) 8.9 - 10.3 mg/dL   Total Protein 5.8 (L) 6.5 - 8.1 g/dL   Albumin 3.4 (L) 3.5 - 5.0 g/dL   AST 12 (L) 15 - 41 U/L   ALT 8 0 - 44 U/L   Alkaline Phosphatase 53 38 - 126 U/L   Total Bilirubin 1.1 0.3 - 1.2 mg/dL   GFR calc non Af Amer 50 (L) >60 mL/min   GFR calc Af Amer 58 (L) >60 mL/min   Anion gap 12 5 - 15  Valproic acid level     Status: None   Collection Time: 12/21/19  3:02 AM  Result Value Ref Range   Valproic Acid Lvl 54 50.0 - 100.0  ug/mL   No results found.   Assessment/Plan: Diagnosis: ICH 1. Does the need for close, 24 hr/day medical supervision in concert with the patient's rehab needs make it unreasonable for this patient to be served in a less intensive setting? Yes 2. Co-Morbidities requiring supervision/potential complications: HLD, OSA, HTN, metabolic syndrome, obesity (BMI 33.95), prior stroke 3. Due to bladder management, bowel management, safety, skin/wound care, disease management, medication administration, pain management and patient education, does the patient require 24 hr/day rehab nursing? Yes 4. Does the patient require coordinated care of a physician, rehab nurse, therapy disciplines of PT, OT to address physical and functional deficits in the context of the above medical diagnosis(es)? Yes Addressing deficits in the following areas: balance, endurance, locomotion, strength, transferring, bowel/bladder control, bathing, dressing, feeding, grooming, toileting, cognition and psychosocial support 5. Can the patient actively participate in an intensive therapy program of at least 3 hrs of therapy per day at least 5 days per week? Yes 6. The potential for patient to make measurable gains while on inpatient rehab is excellent 7. Anticipated functional outcomes upon discharge from inpatient rehab are modified independent  with PT, modified independent with OT, independent with SLP. 8. Estimated rehab length of stay to reach the above functional goals is: 5 days 9. Anticipated discharge destination: Home 10. Overall Rehab/Functional Prognosis: excellent  RECOMMENDATIONS: This patient's condition is appropriate for continued rehabilitative care in the following setting: CIR Patient has agreed to participate in recommended program. Yes Note that insurance prior authorization may be required for reimbursement for recommended care.  Comment: Thank you for this consult. Admission coordinator to follow.   I have  personally performed a face to face diagnostic evaluation, including, but not limited to relevant history  and physical exam findings, of this patient and developed relevant assessment and plan.  Additionally, I have reviewed and concur with the physician assistant's documentation above.  Leeroy Cha, MD  Lavon Paganini [place], PA-C 12/21/2019

## 2019-12-21 NOTE — Evaluation (Signed)
Speech Language Pathology Evaluation Patient Details Name: Ronald [name] MRN: [id number] DOB: 28-Mar-1946 Today's Date: 12/21/2019 Time: 0922-1008 SLP Time Calculation (min) (ACUTE ONLY): 46 min  Problem List:  Patient Active Problem List   Diagnosis Date Noted  . Back pain 01/23/2019  . Lower extremity edema 01/15/2019  . Fatigue due to treatment 01/15/2019  . Acute pulmonary embolism ([place]) 01/04/2019  . Acute respiratory failure with hypoxia ([place]) 01/04/2019  . Pain   . AMS (altered mental status) 11/23/2018  . Acute encephalopathy 11/22/2018  . CKD (chronic kidney disease), stage III 11/22/2018  . Thoracic ascending aortic aneurysm ([place]) 11/22/2018  . Seizures ([place]), secondsry to [facility] 10/23/2018  . Coagulopathy ([place]), Eliquis 10/23/2018  . Cerebral edema ([place]) 10/23/2018  . Hypertensive emergency 10/23/2018  . Advanced age 26/16/2020  . ICH (intracerebral hemorrhage) ([place]) w/ SAH while on Eliquis 10/21/2018  . Hepatic steatosis 04/22/2018  . Actinic keratoses 06/07/2017  . BPH (benign prostatic hyperplasia) 05/30/2017  . Cardiomyopathy due to systemic disease ([place]) 03/09/2017  . Chronic diastolic CHF (congestive heart failure) ([place])   . History of colonic polyps 07/03/2016  . Chronic anticoagulation 07/03/2016  . Hyperglycemia, drug-induced 04/05/2015  . 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 ([place]) 02/21/2015  . On amiodarone therapy 12/08/2014  . Edema of both legs 12/08/2014  . Exertional dyspnea 08/18/2014  . Hypokalemia   . Tobacco abuse   . Paroxysmal atrial fibrillation (HCC) - CHA2DS2Vasc = 7; On Eliquis, Amiodarone 05/04/2014  . Cigarette smoker two packs a day or less   . Prostate cancer ([place]) 10/15/2013  . Obesity (BMI 30-39.9) 04/17/2013  . Umbilical hernia 78/58/8502  . Right inguinal hernia 06/23/2012  . Hydrocele 06/19/2012  . Metabolic syndrome 77/41/2878  . Type 2 diabetes mellitus with hyperglycemia ([place]) 03/20/2012  .  Elevated PSA 03/20/2012  . Gout, unspecified 10/07/2009  . TINEA VERSICOLOR 07/19/2009  . PERS HX TOBACCO USE PRESENTING HAZARDS HEALTH 07/19/2009  . Obstructive sleep apnea 07/02/2008  . Hyperlipidemia with target LDL less than 70 07/01/2008  . Essential hypertension 07/01/2008  . ALLERGIC RHINITIS 07/01/2008   Past Medical History:  Past Medical History:  Diagnosis Date  . Allergic rhinitis   . Anticoagulant long-term use    eliquis  . Atrial fibrillation ([place])   . Cardiomyopathy due to systemic disease [facility])    followed by dr harding  . COPD with emphysema [facility])    pulmologist-  dr Halford Chessman  . Dyspnea    occasional per pt  . Heart disease   . History of colon polyps    tubular adenoma 2013  . History of gout    09-05-2017 last flare-up  05/ 2019 3 wks ago, feet  . History of sepsis 02/18/2017   per d/c note probable uti, acute chf, acute renal failure, hypoxia  . History of YAG laser capsulotomy of lens December 2020, January 2021  . Hyperlipidemia   . Hyperplasia of prostate with lower urinary tract symptoms (LUTS)   . Hypertension   . OSA on CPAP    per study 08-03-2004  Severe OSA  . Persistent atrial fibrillation [facility])    cardiologist --  dr Dorris Carnes--  post cardioversion 05-07-2014  . Prostate cancer [facility]) urologsit-  dr Alyson Ingles--- as of 05-21-2017 per pt last PSA 11 approx.   Dx  2014--  stage T1c, Gleason 3+3=6, PSA 6.67--  Active surveillance/  04/ 2019  Stage T1b, Gleason 3+4, PSA 12.8- plan external radiation therapy    . Respiratory bronchiolitis  associated interstitial lung disease ([place])    pulmologist-  dr Halford Chessman  . Seasonal allergies   . Sigmoid diverticulosis   . Systolic and diastolic CHF, chronic [facility])    cardiologist-  [name]  . Tinea versicolor   . Type 2 diabetes mellitus ([place])   . Wears hearing aid in both ears    Past Surgical History:  Past Surgical History:  Procedure Laterality Date  . CARDIOVERSION N/A 05/07/2014   Procedure:  CARDIOVERSION;  Surgeon: [name], MD;  Location: [facility] ENDOSCOPY;  Service: Cardiovascular;  Laterality: N/A;  . CARDIOVERSION N/A 09/09/2014   Procedure: CARDIOVERSION;  Surgeon: Lelon Perla, MD;  Location: [facility] ENDOSCOPY;  Service: Cardiovascular;  Laterality: N/A;  successfully  . CATARACT EXTRACTION W/ INTRAOCULAR LENS  IMPLANT, BILATERAL  08 and 09/  2018  . COLONOSCOPY  last one 06-07-2011  . GOLD SEED IMPLANT N/A 09/09/2017   Procedure: GOLD SEED IMPLANT;  Surgeon: [name], MD;  Location: [facility];  Service: Urology;  Laterality: N/A;  . HYDROCELE EXCISION Bilateral 09/26/2015   Procedure: HYDROCELECTOMY ADULT;  Surgeon: [name], MD;  Location: [facility];  Service: Urology;  Laterality: Bilateral;  . LAMINECTOMY AND MICRODISCECTOMY LUMBAR SPINE  12-23-2003   Left L5 -- S1  . LAPAROSCOPIC BILATERAL INGUINAL HERNIA REPAIR/  UMBILICAL HERNIA REPAIR WITH MESH/  ASPIRATION LEFT HYDROCELE  07-11-2012  . NM MYOVIEW LTD  05/18/2014   Low risk study. Normal perfusion: No ischemia or infarction. Mild LV dysfunction - 46% (does not correlate with echocardiographic EF of 50-55%)  . PROSTATE BIOPSY  05/15/12   Clinically both Lobes  . SPACE OAR INSTILLATION N/A 09/09/2017   Procedure: SPACE OAR INSTILLATION;  Surgeon: [name], MD;  Location: [facility];  Service: Urology;  Laterality: N/A;  . TEE WITHOUT CARDIOVERSION N/A 05/07/2014   Procedure: TRANSESOPHAGEAL ECHOCARDIOGRAM (TEE);  Surgeon: [name], MD;  Location: [facility] ENDOSCOPY;  Service: Cardiovascular;  Laterality: N/A;   mild atherosclerosis plaque of aorta,  mild AR, MR, and TR,  no cardiac source of emboli  . TONSILLECTOMY  as child  . TRANSTHORACIC ECHOCARDIOGRAM  11/'18; 1/'19   a) In setting of sepsis: EF of 40-45%.  Diffuse hypokinesis.  No RWMA.  Biatrial enlargement.;; b) ** f/u Jan 2019: Normal LVF 55-60%** , mildly dilated aortic root(38 mm) and  ascending aorta (44 mm). Compared to prior echo, LVEF has improved.  . TRANSTHORACIC ECHOCARDIOGRAM  7/'20; 9/'20   a) Hemorrhagic CVA/SAH - EF> 65%.  Moderate concentric LVH.-GR 1 DD mild RA dilation.; b) EF 60 to 65%.  Mild LVH.  GRII DD.  Normal RV size and function.  Mild LA dilation.  Normal RA size.  Unable to assess PA pressures.  No significant aortic sclerosis.  . TRANSURETHRAL RESECTION OF PROSTATE N/A 05/30/2017   Procedure: TRANSURETHRAL RESECTION OF THE PROSTATE (TURP);  Surgeon: [name], MD;  Location: [facility] ORS;  Service: Urology;  Laterality: N/A;   HPI:  74 y.o. male with a past medical history significant for hypertension, hyperlipidemia, atrial fibrillation, COPD, left frontal hemorrhagic stroke (February 2020), mild residual right leg weakness, complicated by seizure now on Depakote, admitted with acute intraparenchymal hemorrhage.  MRI showed hemorrhage right occipital lobe, 1 cm acute ischemic nonhemorrhagic infarct involving subcortical left frontal lobe, innumerable chronic micro hemorrhages throughout both cerebral hemispheres and cerebellum, progressed as compared to 2020.    Assessment / Plan / Recommendation Clinical Impression  Pt presents with delays in processing time, deficits in short-term recall, selective attention, and vision.  Speech is clear without dysarthria, fluent, with no language deficits.  Pt demonstrates improving awareness of deficits with emerging ability to self-correct errors.  He verbalizes his frustration surrounding this new event, particularly the changes in his vision, after working so hard to recover after last year's stroke.  Provided active listening/encouragement.  Recommend SLP f/u while on acute care to address the aforementioned deficits; pt is excellent candidate for CIR.  WIll follow..     SLP Assessment  SLP Recommendation/Assessment: Patient needs continued Speech Lanaguage Pathology Services    Follow Up Recommendations   Inpatient Rehab    Frequency and Duration min 2x/week  1 week      SLP Evaluation Cognition  Overall Cognitive Status: Impaired/Different from baseline Arousal/Alertness: Awake/alert Orientation Level: Oriented X4 Attention: Sustained;Selective Sustained Attention: Appears intact Selective Attention: Impaired Selective Attention Impairment: Verbal basic Memory: Impaired Memory Impairment: Storage deficit;Retrieval deficit Awareness: Impaired Awareness Impairment: Emergent impairment Safety/Judgment: Impaired       Comprehension  Auditory Comprehension Overall Auditory Comprehension: Appears within functional limits for tasks assessed Reading Comprehension Reading Status: Not tested    Expression Expression Primary Mode of Expression: Verbal Verbal Expression Overall Verbal Expression: Appears within functional limits for tasks assessed Written Expression Dominant Hand: Left Written Expression: Not tested   Oral / Motor  Oral Motor/Sensory Function Overall Oral Motor/Sensory Function: Mild impairment Facial Symmetry: Abnormal symmetry left;Suspected CN VII (facial) dysfunction (uncertain if baseline) Motor Speech Overall Motor Speech: Appears within functional limits for tasks assessed   GO                    [name] 12/21/2019, 10:15 AM  Estill Bamberg L. Tivis Ringer, [place] Office number [phone] Pager [phone]

## 2019-12-21 NOTE — Progress Notes (Signed)
Occupational Therapy Treatment Patient Details Name: Ronald [name] MRN: [id number] DOB: June 04, 1945 Today's Date: 12/21/2019    History of present illness Ronald [name] is a 74 y.o. male with a past medical history significant for hypertension, hyperlipidemia, atrial fibrillation, COPD, left frontal hemorrhagic stroke (February 2020, mild residual right leg weakness, complicated by seizure--went to CIR), unprovoked pulmonary embolus, and ongoing tobacco abuse. CT: Acute intraparenchymal hemorrhage centered at the rightparieto-occipital region. Mild surrounding vasogenic edema withoutsignificant regional mass effect.   OT comments  This 74 yo male admitted with above seen today to focus on vision in hallway environment, room environment, and pen/paper tasks. He is making progress but his overall mobility remains rather impaired due to his vision. Continue to recommend CIR for intensive compensation vision strategies for mobility and ADLs.  Follow Up Recommendations  CIR;Supervision/Assistance - 24 hour    Equipment Recommendations  None recommended by OT       Precautions / Restrictions Precautions Precautions: Fall;Other (comment) Precaution Comments: impaired vision (only can see out of 1/4 of each eye --rt upper quadrant) Restrictions Weight Bearing Restrictions: No       Mobility Bed Mobility               General bed mobility comments: pt up with PT in room  Transfers Overall transfer level: Needs assistance Equipment used: None Transfers: Sit to/from Stand Sit to Stand: Min guard              Balance Overall balance assessment: Needs assistance Sitting-balance support: No upper extremity supported;Feet supported Sitting balance-Leahy Scale: Good     Standing balance support: No upper extremity supported Standing balance-Leahy Scale: Fair Standing balance comment: swaying at times when up on his feet                           ADL either  performed or assessed with clinical judgement   ADL Overall ADL's : Needs assistance/impaired     Grooming: Wash/dry hands;Oral care;Min guard;Standing                   Toilet Transfer: Minimal assistance;Ambulation Toilet Transfer Details (indicate cue type and reason): no AD, standing at toilet to urinate                 Vision Baseline Vision/History: No visual deficits Patient Visual Report: Peripheral vision impairment Vision Assessment?: Yes Eye Alignment: Within Functional Limits Ocular Range of Motion: Within Functional Limits Alignment/Gaze Preference: Within Defined Limits Visual Fields: Left homonymous hemianopsia;Right inferior homonymous quadranopsia Depth Perception: Undershoots;Overshoots Additional Comments: Worked on standing at bulletin board in hallway and reading it (did well with moving head and eyes around); looking down hallways to find exit signs (found all 3 on one hallway witih increased time; only found 2 on other hallway until I asked him to point to them and then he said he saw 3--the one he did not orginally see was in his far left upper visual field). Also worked on pen and paper tasks. Pt able to read in large print words and letters on page with increased time (did not miss any and he had to hold the paper up in his hands to see them), For tracing of shapes he had a harder time (paper had to stay on tray table for him to do this, he had to orient the paper at a slant)--had trouble staying on the lines. He was unable to do the line  bi-section test but he was able to put a dot at each end of lines with close accuracy.          Cognition Arousal/Alertness: Awake/alert Behavior During Therapy: WFL for tasks assessed/performed Overall Cognitive Status: Impaired/Different from baseline Area of Impairment: Safety/judgement                         Safety/Judgement: Decreased awareness of safety     General Comments: due to vision                    Pertinent Vitals/ Pain       Pain Assessment: No/denies pain  Home Living     Available Help at Discharge: Family;Available 24 hours/day Type of Home: House                              Lives With: Spouse        Frequency  Min 2X/week        Progress Toward Goals  OT Goals(current goals can now be found in the care plan section)  Progress towards OT goals: Progressing toward goals     Plan Discharge plan remains appropriate       AM-PAC OT "6 Clicks" Daily Activity     Outcome Measure   Help from another person eating meals?: None Help from another person taking care of personal grooming?: A Little Help from another person toileting, which includes using toliet, bedpan, or urinal?: A Little Help from another person bathing (including washing, rinsing, drying)?: A Little Help from another person to put on and taking off regular upper body clothing?: A Little Help from another person to put on and taking off regular lower body clothing?: A Little 6 Click Score: 19    End of Session Equipment Utilized During Treatment: Gait belt  OT Visit Diagnosis: Unsteadiness on feet (R26.81);Other abnormalities of gait and mobility (R26.89);History of falling (Z91.81);Low vision, both eyes (H54.2)   Activity Tolerance Patient tolerated treatment well   Patient Left in chair;with call bell/phone within reach;with chair alarm set   Nurse Communication  (NT--made her aware he only has 1/4 of his vision in each eye)        Time: 8657-8469 OT Time Calculation (min): 37 min  Charges: OT General Charges $OT Visit: 1 Visit OT Treatments $Self Care/Home Management : 23-37 mins  [address], OTR/L Acute [employer] Pager [phone] Office [phone]      Almon Register 12/21/2019, 12:25 PM

## 2019-12-21 NOTE — Progress Notes (Signed)
STROKE TEAM PROGRESS NOTE   INTERVAL HISTORY Patient is sitting up in bed.  .No one is at the bedside.  He still has persistent left-sided visual field deficits.  Physical therapist recommend inpatient rehab.  Vital signs are stable.  No neurological changes.Family appears interested in [place] device.  Neurological exam is unchanged OBJECTIVE Vitals:   12/21/19 0213 12/21/19 0324 12/21/19 0900 12/21/19 1201  BP: (!) 156/92 (!) 159/93 137/89 (!) 140/96  Pulse:  66 63 (!) 58  Resp:  17 18 18   Temp:  97.6 F (36.4 C) 98.1 F (36.7 C) 97.9 F (36.6 C)  TempSrc:  Oral Oral Oral  SpO2:  95% 92% 96%  Weight:      Height:        CBC:  Recent Labs  Lab 12/17/19 2003 12/17/19 2012 12/20/19 0133 12/21/19 0302  WBC 6.6   < > 7.1 6.4  NEUTROABS 4.3  --   --   --   HGB 15.6   < > 15.6 15.2  HCT 47.1   < > 45.6 44.7  MCV 96.3   < > 94.4 94.7  PLT 205   < > 184 177   < > = values in this interval not displayed.    Basic Metabolic Panel:  Recent Labs  Lab 12/20/19 0133 12/21/19 0302  NA 135 133*  K 3.3* 3.4*  CL 99 97*  CO2 25 24  GLUCOSE 131* 119*  BUN 20 21  CREATININE 1.37* 1.38*  CALCIUM 8.8* 8.8*    Lipid Panel:     Component Value Date/Time   CHOL 125 12/18/2019 0644   TRIG 97 12/18/2019 0644   HDL 30 (L) 12/18/2019 0644   CHOLHDL 4.2 12/18/2019 0644   VLDL 19 12/18/2019 0644   LDLCALC 76 12/18/2019 0644   HgbA1c:  Lab Results  Component Value Date   HGBA1C 6.3 (H) 12/18/2019   Urine Drug Screen:     Component Value Date/Time   LABOPIA NONE DETECTED 12/17/2019 2102   COCAINSCRNUR NONE DETECTED 12/17/2019 2102   LABBENZ NONE DETECTED 12/17/2019 2102   AMPHETMU NONE DETECTED 12/17/2019 2102   THCU NONE DETECTED 12/17/2019 2102   LABBARB NONE DETECTED 12/17/2019 2102    Alcohol Level     Component Value Date/Time   ETH <10 12/17/2019 2003    IMAGING  CT Code Stroke CTA Head W/WO contrast CT Code Stroke CTA Neck W/WO  contrast 12/17/2019 IMPRESSION:  1. Stable CTA of the head and neck. No large vessel occlusion.  2. No aneurysm or evidence for vasculitis. No vascular abnormality seen underlying the right cerebral hemorrhage.  3. Scattered atherosclerotic calcifications about the proximal right and distal vertebral arteries with associated mild multifocal stenoses, stable.  4. Aneurysmal dilatation of the ascending aorta up to 4.1 cm, stable. Recommend annual imaging followup by CTA or MRA. This recommendation follows 2010 ACCF/AHA/AATS/ACR/ASA/SCA/SCAI/SIR/STS/SVM Guidelines for the Diagnosis and Management of Patients with Thoracic Aortic Disease. Circulation. 2010; 121: W413-K440. Aortic aneurysm NOS (ICD10-I71.9)  5. Aortic Atherosclerosis (ICD10-I70.0) and Emphysema (ICD10-J43.9).   CT HEAD WO CONTRAST 12/18/2019 IMPRESSION:  Stable right occipital hemorrhage measuring up to 4 cm. Stable surrounding vasogenic edema. Atrophy, chronic small vessel disease.   MR BRAIN WO CONTRAST 12/18/2019 IMPRESSION:  1. No significant interval change in the acute intraparenchymal right occipital hemorrhage. Mild surrounding edema without significant regional mass effect.  2. 1 cm acute ischemic nonhemorrhagic infarct involving the subcortical left frontal lobe.  3. Innumerable chronic micro hemorrhages throughout both  cerebral hemispheres and cerebellum, progressed as compared to 2020. Findings are most suspicious for possible cerebral amyloid angiopathy given distribution. Sequelae of poorly controlled hypertension would be the primary differential consideration.  4. Underlying mild chronic microvascular ischemic disease with chronic right frontal encephalomalacia.   DG Chest Port 1 View 12/17/2019 IMPRESSION:  Stable cardiomegaly. No acute chest findings.    CT HEAD CODE STROKE WO CONTRAST 12/17/2019 IMPRESSION:  1. 21 cc acute intraparenchymal hemorrhage centered at the right parieto-occipital region. Mild surrounding  vasogenic edema without significant regional mass effect.  2. Underlying atrophy with chronic small vessel ischemic disease with chronic right frontal encephalomalacia.   Transthoracic Echocardiogram  1. Left ventricular ejection fraction, by estimation, is 60 to 65%. The  left ventricle has normal function. The left ventricle has no regional  wall motion abnormalities. There is severe asymmetric left ventricular  hypertrophy. Left ventricular diastolic  parameters are indeterminate.  2. Right ventricular systolic function is normal. The right ventricular  size is normal.  3. The mitral valve is normal in structure. No evidence of mitral valve  regurgitation.  4. The aortic valve is normal in structure. Aortic valve regurgitation is  not visualized. No aortic stenosis is present.  5. Aortic dilatation noted. There is moderate dilatation of the ascending  aorta, measuring 40 mm.   ECG - SR rate 77 BPM. (See cardiology reading for complete details)   PHYSICAL EXAM  Temp:  [97.6 F (36.4 C)-98.4 F (36.9 C)] 97.9 F (36.6 C) (09/13 1201) Pulse Rate:  [58-81] 58 (09/13 1201) Resp:  [17-20] 18 (09/13 1201) BP: (137-169)/(89-104) 140/96 (09/13 1201) SpO2:  [90 %-96 %] 96 % (09/13 1201)  General -obese elderly Caucasian male not in distress. Ophthalmologic - fundi not visualized due to noncooperation.  Cardiovascular - Regular rhythm and rate.  Neurological Exam ;  Awake  Alert oriented x 3. Normal speech and language.eye movements full without nystagmus.fundi were not visualized. Vision acuity  appears normal.  But dense left homonymous hemianopsia.  Hearing is normal. Palatal movements are normal. Face symmetric. Tongue midline. Normal strength, tone, reflexes and coordination. Normal sensation. Gait deferred.  ASSESSMENT/PLAN Ronald [name] is a 74 y.o. male with history of hypertension, hyperlipidemia, COPD, DM, hearing impaired, cardiomyopathy, CHF hx, atrial  fibrillation (currently on Eliquis for PE as well), COPD, left frontal hemorrhagic stroke (February 2020, secondary to Eliquis, mild residual right leg weakness, complicated by seizure (now on Depakote), unprovoked pulmonary embolus (restarted on Eliquis in September 2020), and ongoing tobacco abuse presenting with visual difficulties, severe headache, worsening of his chronic left leg weakness and confusion. He did not receive tPA due to [place].  ICH: acute ICH at the right parieto-occipital region, possible hemorrhagic infarct secondary to Eliquis additional small acute embolic infarct at subcortical left frontal lobe - afib    CT Head - 21 cc acute intraparenchymal hemorrhage centered at the right parieto-occipital region.     CT head - Stable right occipital hemorrhage measuring up to 4 cm. Stable surrounding vasogenic edema.   CTA H&N -  Stable CTA of the head and neck. No large vessel occlusion. No aneurysm or evidence for vasculitis. No vascular abnormality seen underlying the right cerebral hemorrhage.  MRI head -  No significant interval change in the acute intraparenchymal right occipital hemorrhage. 1 cm acute ischemic nonhemorrhagic infarct involving the subcortical left frontal lobe. Innumerable chronic micro hemorrhages throughout both cerebral hemispheres and cerebellum, progressed as compared to 2020. Findings are most suspicious for  possible cerebral amyloid angiopathy given distribution. Sequelae of poorly controlled hypertension would be the primary differential consideration.   2D Echo EF 60-65%  [employer] Virus 2 - negative  LDL 76  HgbA1c 6.3  UDS - negative  VTE prophylaxis - SCDs  Eliquis (apixaban) daily prior to admission, now on No antithrombotic due to [place]  On Depakote, continue - depakote level 56 (therapeutic range 50 - 100)  Ongoing aggressive stroke risk factor management  Therapy recommendations:    CIR    Disposition:  Pending  Hx of ICH and  seizure  10/2018 admitted for right frontal ICH in the setting of Eliquis.  Status post Kcentra reversal.  CTA head and neck no LVO.  CTV negative.  MRI right parafalcine frontal ICH with edema and local mass-effect.  No CAA identified.  EF more than 65%.  EEG normal.  LDL 77 and A1c 6.9.  Had GTC seizure on presentation, loaded with Keppra.  EEG normal.  Initially BP was high and treated with Cleviprex later tapered off.  He was discharged with aspirin 81 and Keppra.  Resume Lipitor 10.  Patient and wife refused SATURN trial.  11/2018 admitted for seizure.  Keppra switched to Depakote due to possible side effect of Keppra causing confusion.  MRI with and without contrast no acute abnormality  12/2018 admitted for PE.  Was put on heparin drip and then switched to Eliquis.  Follow with [facility] Jannifer [facility] at [facility].  Possible CAA ( cerebral amyloid angiopathy )  Lobar ICH in 10/2018 and this admission  Hypertensive in 10/2018 but no significant hypertension this admission  MRI in 10/2018 did not show clear CAA but this admission MRI concerning for possible CAA  Given patient recurrent lobar ICH and possible CAA, anticoagulation may be discontinued.  However, patient had A. fib and developed a PE in 12/2018 without anticoagulation, making anticoagulation necessary.    Clinical dilemma for antithrombotic decision.  Discussed with wife and patient, they would like to revisit this issue at the time of hematoma resolution, likely at outpatient follow-up.  PAF Hx of PE in the absence of anticoagulation 12/2018  On eliquis 5mg  bid PTA  Currently of AC due to [place]  Clinical dilemma for antithrombotic decision.  Discussed with wife and patient, they would like to revisit this issue at the time of hematoma resolution, likely at outpatient follow-up.  Hypertension  Home BP meds: Cozaar ; Coreg   Current BP meds: Cozaar ; Coreg resumed  Stable now  On cardene, taper off if able . SBP goal < 160 mm Hg  initially . Long-term BP goal normotensive  Hyperlipidemia  Home Lipid lowering medication: Lipitor 10 mg daily   LDL - 76, goal < 70  Currently lipitor on hold given acute ICH  Continue statin at discharge  Diabetes  Home diabetic meds: metformin  Current diabetic meds: none  HgbA1c 6.3, goal < 7.0  SSI  CBG monitoring  Close PCP follow up  Tobacco abuse  Current smoker  Smoking cessation counseling provided  Pt is willing to quit  Other Stroke Risk Factors  Advanced age  Previous ETOH use  Obesity, Body mass index is 33.95 kg/m., recommend weight loss, diet and exercise as appropriate   Family hx stroke (mother)   OSA on CPAP  CHF hx  Other Active Problems  Code status - Full code  Aneurysmal dilatation of the ascending aorta up to 4.1 cm, stable. Recommend annual imaging followup by CTA or MRA  Aortic Atherosclerosis (ICD10-I70.0) Emphysema (ICD10-J43.9)  CKD - stage 3a - creatinine - 1.35->1.40->1.38->1.50 Hypokalemia K 3.4-supplement ->3.7 Pt and wife declined [facility] day # 4  Patient presented in July 2020 with this similar hemorrhagic infarct in left frontal lobe and now presents with right occipital hemorrhagic infarct in the setting of A. fib and anticoagulation.  He presents a treatment dilemma as clearly 2 separate anticoagulants have failed to prevent further strokes and have led to hemorrhagic complications.  I feel he may benefit with consideration for watchman device following which he will need only short-term anticoagulation.  Plan to consult EP team today and plan elective outpatient watchman device procedure by Dr. [name] and Burt Knack in 4 to 6 weeks..  Long discussion with patient and EP team nurse practitioner about treatment options for A. fib and hemorrhagic strokes and anticoagulation risk-benefit and answered questions.  Await transfer to inpatient rehab over the next few days when bed available greater than 50%  time during this 25-minute visit was spent on counseling and coordination of care about his stroke and discussion of risk benefits of anticoagulation and alternative treatment options including watchman device.  Antony Contras. MD      To contact Stroke Continuity provider, please refer to [email or web address] After hours, contact General Neurology

## 2019-12-22 DIAGNOSIS — I612 Nontraumatic intracerebral hemorrhage in hemisphere, unspecified: Secondary | ICD-10-CM

## 2019-12-22 LAB — COMPREHENSIVE METABOLIC PANEL
ALT: 9 U/L (ref 0–44)
AST: 16 U/L (ref 15–41)
Albumin: 3.3 g/dL — ABNORMAL LOW (ref 3.5–5.0)
Alkaline Phosphatase: 52 U/L (ref 38–126)
Anion gap: 11 (ref 5–15)
BUN: 22 mg/dL (ref 8–23)
CO2: 23 mmol/L (ref 22–32)
Calcium: 8.7 mg/dL — ABNORMAL LOW (ref 8.9–10.3)
Chloride: 98 mmol/L (ref 98–111)
Creatinine, Ser: 1.26 mg/dL — ABNORMAL HIGH (ref 0.61–1.24)
GFR calc Af Amer: >60 mL/min (ref >60)
GFR calc non Af Amer: 56 mL/min — ABNORMAL LOW (ref >60)
Glucose, Bld: 124 mg/dL — ABNORMAL HIGH (ref 70–99)
Potassium: 4 mmol/L (ref 3.5–5.1)
Sodium: 132 mmol/L — ABNORMAL LOW (ref 135–145)
Total Bilirubin: 1.2 mg/dL (ref 0.3–1.2)
Total Protein: 5.5 g/dL — ABNORMAL LOW (ref 6.5–8.1)

## 2019-12-22 LAB — CBC
HCT: 49.6 % (ref 39.0–52.0)
Hemoglobin: 16.6 g/dL (ref 13.0–17.0)
MCH: 31.4 pg (ref 26.0–34.0)
MCHC: 33.5 g/dL (ref 30.0–36.0)
MCV: 93.9 fL (ref 80.0–100.0)
Platelets: 190 10*3/uL (ref 150–400)
RBC: 5.28 MIL/uL (ref 4.22–5.81)
RDW: 13.2 % (ref 11.5–15.5)
WBC: 6 10*3/uL (ref 4.0–10.5)
nRBC: 0 % (ref 0.0–0.2)

## 2019-12-22 LAB — GLUCOSE, CAPILLARY: Glucose-Capillary: 193 mg/dL — ABNORMAL HIGH (ref 70–99)

## 2019-12-22 LAB — SARS CORONAVIRUS 2 (TAT 6-24 HRS): SARS Coronavirus 2: NEGATIVE

## 2019-12-22 LAB — VALPROIC ACID LEVEL: Valproic Acid Lvl: 56 ug/mL (ref 50.0–100.0)

## 2019-12-22 MED ORDER — ASPIRIN EC 81 MG PO TBEC
81.0000 mg | DELAYED_RELEASE_TABLET | Freq: Every day | ORAL | Status: DC
  Administered 2019-12-22 – 2019-12-23 (×2): 81 mg via ORAL
  Filled 2019-12-22: qty 1

## 2019-12-22 MED ORDER — GLIMEPIRIDE 2 MG PO TABS
2.0000 mg | ORAL_TABLET | Freq: Every day | ORAL | Status: DC
  Administered 2019-12-23: 2 mg via ORAL
  Filled 2019-12-22: qty 1

## 2019-12-22 MED ORDER — METFORMIN HCL 500 MG PO TABS
1000.0000 mg | ORAL_TABLET | Freq: Two times a day (BID) | ORAL | Status: DC
  Administered 2019-12-22 – 2019-12-23 (×2): 1000 mg via ORAL
  Filled 2019-12-22 (×2): qty 2

## 2019-12-22 NOTE — Progress Notes (Signed)
Inpatient Rehab Admissions:  Inpatient Rehab Consult received.  Spoke with TOC team, pt has already been faxed to [facility].  CIR beds extremely limited at this time.  We will follow from the periphery for now.   Signed: Shann Medal, PT, DPT Admissions Coordinator [phone] 12/22/19  11:22 AM

## 2019-12-22 NOTE — Plan of Care (Signed)
  Problem: Education: Goal: Knowledge of disease or condition will improve Outcome: Progressing Goal: Knowledge of secondary prevention will improve Outcome: Progressing Goal: Knowledge of patient specific risk factors addressed and post discharge goals established will improve Outcome: Progressing Goal: Individualized Educational Video(s) Outcome: Progressing   Problem: Coping: Goal: Will verbalize positive feelings about self Outcome: Progressing Goal: Will identify appropriate support needs Outcome: Progressing   Problem: Self-Care: Goal: Ability to participate in self-care as condition permits will improve Outcome: Progressing   Problem: Education: Goal: Knowledge of General Education information will improve Description: Including pain rating scale, medication(s)/side effects and non-pharmacologic comfort measures Outcome: Progressing   Problem: Health Behavior/Discharge Planning: Goal: Ability to manage health-related needs will improve Outcome: Progressing   Problem: Clinical Measurements: Goal: Ability to maintain clinical measurements within normal limits will improve Outcome: Progressing Goal: Will remain free from infection Outcome: Progressing Goal: Diagnostic test results will improve Outcome: Progressing Goal: Respiratory complications will improve Outcome: Progressing Goal: Cardiovascular complication will be avoided Outcome: Progressing   Problem: Activity: Goal: Risk for activity intolerance will decrease Outcome: Progressing   Problem: Nutrition: Goal: Adequate nutrition will be maintained Outcome: Progressing   Problem: Coping: Goal: Level of anxiety will decrease Outcome: Progressing   Problem: Elimination: Goal: Will not experience complications related to bowel motility Outcome: Progressing Goal: Will not experience complications related to urinary retention Outcome: Progressing   Problem: Pain Managment: Goal: General experience of  comfort will improve Outcome: Progressing   Problem: Safety: Goal: Ability to remain free from injury will improve Outcome: Progressing   Problem: Skin Integrity: Goal: Risk for impaired skin integrity will decrease Outcome: Progressing

## 2019-12-22 NOTE — Progress Notes (Signed)
STROKE TEAM PROGRESS NOTE   INTERVAL HISTORY Wife at bedside. Plans are for CIR in [place]. Waiting to hear from them. OP follow up for Watchman device. Follow up with Dr. Leonie Man after rehab stay.  Inpatient re[facility] beds at [facility] are scarce hence will refer to [facility] rehab for transfer if they accept him.  Neurologically his exam is unchanged.  Vital signs are stable.   OBJECTIVE Vitals:   12/22/19 0003 12/22/19 0045 12/22/19 0318 12/22/19 0921  BP: (!) 172/108 129/88 (!) 158/88 (!) 152/92  Pulse: (!) 59 61 (!) 49 71  Resp: 18  19 18   Temp: (!) 97.4 F (36.3 C)  (!) 97.5 F (36.4 C) 98 F (36.7 C)  TempSrc: Oral  Oral Oral  SpO2: 96%  95% 96%  Weight:      Height:       CBC:  Recent Labs  Lab 12/17/19 2003 12/17/19 2012 12/21/19 0302 12/22/19 0848  WBC 6.6   < > 6.4 6.0  NEUTROABS 4.3  --   --   --   HGB 15.6   < > 15.2 16.6  HCT 47.1   < > 44.7 49.6  MCV 96.3   < > 94.7 93.9  PLT 205   < > 177 190   < > = values in this interval not displayed.   Basic Metabolic Panel:  Recent Labs  Lab 12/21/19 0302 12/22/19 0353  NA 133* 132*  K 3.4* 4.0  CL 97* 98  CO2 24 23  GLUCOSE 119* 124*  BUN 21 22  CREATININE 1.38* 1.26*  CALCIUM 8.8* 8.7*   Lipid Panel:     Component Value Date/Time   CHOL 125 12/18/2019 0644   TRIG 97 12/18/2019 0644   HDL 30 (L) 12/18/2019 0644   CHOLHDL 4.2 12/18/2019 0644   VLDL 19 12/18/2019 0644   LDLCALC 76 12/18/2019 0644   HgbA1c:  Lab Results  Component Value Date   HGBA1C 6.3 (H) 12/18/2019   Urine Drug Screen:     Component Value Date/Time   LABOPIA NONE DETECTED 12/17/2019 2102   COCAINSCRNUR NONE DETECTED 12/17/2019 2102   LABBENZ NONE DETECTED 12/17/2019 2102   AMPHETMU NONE DETECTED 12/17/2019 2102   THCU NONE DETECTED 12/17/2019 2102   LABBARB NONE DETECTED 12/17/2019 2102    Alcohol Level     Component Value Date/Time   ETH <10 12/17/2019 2003    IMAGING past 24h No results found.   PHYSICAL EXAM      Temp:  [97.4 F (36.3 C)-98.2 F (36.8 C)] 98 F (36.7 C) (09/14 0921) Pulse Rate:  [49-71] 71 (09/14 0921) Resp:  [17-19] 18 (09/14 0921) BP: (120-172)/(72-108) 152/92 (09/14 0921) SpO2:  [95 %-96 %] 96 % (09/14 0921)  General -obese elderly Caucasian male not in distress. Ophthalmologic - fundi not visualized due to noncooperation.  Cardiovascular - Regular rhythm and rate.  Neurological Exam ;  Awake  Alert oriented x 3. Normal speech and language.eye movements full without nystagmus.fundi were not visualized. Vision acuity  appears normal.  But dense left homonymous hemianopsia.  Hearing is normal. Palatal movements are normal. Face symmetric. Tongue midline. Normal strength, tone, reflexes and coordination. Normal sensation. Gait deferred.  ASSESSMENT/PLAN Ronald [name] is a 74 y.o. male with history of hypertension, hyperlipidemia, COPD, DM, hearing impaired, cardiomyopathy, CHF hx, atrial fibrillation (currently on Eliquis for PE as well), COPD, left frontal hemorrhagic stroke (February 2020, secondary to Eliquis, mild residual right leg weakness, complicated  by seizure (now on Depakote), unprovoked pulmonary embolus (restarted on Eliquis in September 2020), and ongoing tobacco abuse presenting with visual difficulties, severe headache, worsening of his chronic left leg weakness and confusion. He did not receive tPA due to [place].  ICH: acute ICH at the right parieto-occipital region, possible hemorrhagic infarct secondary to Eliquis additional small acute embolic infarct at subcortical left frontal lobe - afib    CT Head - 21 cc acute intraparenchymal hemorrhage centered at the right parieto-occipital region.     CT head - Stable right occipital hemorrhage measuring up to 4 cm. Stable surrounding vasogenic edema.   CTA H&N -  Stable CTA of the head and neck. No large vessel occlusion. No aneurysm or evidence for vasculitis. No vascular abnormality seen underlying the right  cerebral hemorrhage.  MRI head -  No significant interval change in the acute intraparenchymal right occipital hemorrhage. 1 cm acute ischemic nonhemorrhagic infarct involving the subcortical left frontal lobe. Innumerable chronic micro hemorrhages throughout both cerebral hemispheres and cerebellum, progressed as compared to 2020. Findings are most suspicious for possible cerebral amyloid angiopathy given distribution. Sequelae of poorly controlled hypertension would be the primary differential consideration.   2D Echo EF 60-65%  [employer] Virus 2 - negative  LDL 76  HgbA1c 6.3  UDS - negative  VTE prophylaxis - SCDs  Eliquis (apixaban) daily prior to admission, now on No antithrombotic due to [place]. It has been 5 days since ICH, will start low dose aspirin today.    On Depakote, continue - depakote level 56 (therapeutic range 50 - 100)  Therapy recommendations:    CIR  -> no bed at [facility] so TOC has referred to [facility] rehab   Disposition:  Pending  Hx of ICH and seizure  10/2018 admitted for right frontal ICH in the setting of Eliquis.  Status post Kcentra reversal.  CTA head and neck no LVO.  CTV negative.  MRI right parafalcine frontal ICH with edema and local mass-effect.  No CAA identified.  EF more than 65%.  EEG normal.  LDL 77 and A1c 6.9.  Had GTC seizure on presentation, loaded with Keppra.  EEG normal.  Initially BP was high and treated with Cleviprex later tapered off.  He was discharged with aspirin 81 and Keppra.  Resume Lipitor 10.  Patient and wife refused SATURN trial.  11/2018 admitted for seizure.  Keppra switched to Depakote due to possible side effect of Keppra causing confusion.  MRI with and without contrast no acute abnormality  12/2018 admitted for PE.  Was put on heparin drip and then switched to Eliquis.  Follow with [facility] Jannifer Franklin at [facility].  Possible CAA ( cerebral amyloid angiopathy )  Lobar ICH in 10/2018 and this admission  Hypertensive in 10/2018 but no  significant hypertension this admission  MRI in 10/2018 did not show clear CAA but this admission MRI concerning for possible CAA  Given patient recurrent lobar ICH and possible CAA, anticoagulation may be discontinued.  However, patient had A. fib and developed a PE in 12/2018 without anticoagulation, making anticoagulation necessary.    Clinical dilemma for antithrombotic decision.  Discussed with wife and patient, they would like to revisit this issue at the time of hematoma resolution, likely at outpatient follow-up.  PAF Hx of PE in the absence of anticoagulation 12/2018  On eliquis 5mg  bid PTA  Currently of AC due to [place]  Clinical dilemma for antithrombotic decision.  Discussed with wife and patient, they would like to  revisit this issue at the time of hematoma resolution, likely at outpatient follow-up.  Cardiology consulted for watchman device as he is not longer a long-term AC candidate.  Watchman coordinator to reach out for more details and schedule CT chest as an OP following rehab stay.  Hypertension  Home BP meds: Cozaar ; Coreg   Current BP meds: Cozaar ; Coreg resumed  Stable now  Treated w/ cardene in ICU . SBP goal < 160 mm Hg  . Long-term BP goal normotensive  Hyperlipidemia  Home Lipid lowering medication: Lipitor 10 mg daily   LDL - 76, goal < 70  Currently lipitor on hold given acute ICH  Continue statin at discharge  Diabetes  Home diabetic meds: metformin  Current diabetic meds: none  HgbA1c 6.3, goal < 7.0  Am glucose ok  Added CBG monitoring  Resume home DB meds  Close PCP follow up  Tobacco abuse  Current smoker  Smoking cessation counseling provided  Pt is willing to quit  Other Stroke Risk Factors  Advanced age  Previous ETOH use  Obesity, Body mass index is 33.95 kg/m., recommend weight loss, diet and exercise as appropriate   Family hx stroke (mother)   OSA on CPAP  CHF hx  Other Active Problems  Code status  - Full code  Aneurysmal dilatation of the ascending aorta up to 4.1 cm, stable. Recommend annual imaging followup by CTA or MRA   Aortic Atherosclerosis (ICD10-I70.0) Emphysema (MVV61-Q24.9)  CKD - stage 3a - creatinine - 1.35->1.40->1.38->1.50 Hypokalemia K 3.4-supplement ->3.7 Pt and wife declined SATURN trial.  Hospital day # 5 Long discussion with patient and wife at the bedside and answered questions.  Neurologically stable to be transferred to inpatient rehab but await bed hopefully at [facility] in the next few days. Ronald Contras, MD  To contact Stroke Continuity provider, please refer to [email or web address] After hours, contact General Neurology

## 2019-12-22 NOTE — Progress Notes (Signed)
Patient refused use of CPAP this evening. RT will continue to monitor.

## 2019-12-22 NOTE — Discharge Summary (Addendum)
Stroke Discharge Summary  Patient ID: Ronald [name]   MRN: [id number]      DOB: 02/11/1946  Date of Admission: 12/17/2019 Date of Discharge: 12/23/2019  Attending Physician:  Garvin Fila, MD, Stroke MD Consultant(s):  Lars Mage, MD (electrophysiology), Leeroy Cha, MD (Physical Medicine & Rehabilitation)   Patient's PCP:  [name], MD  Discharge Diagnoses:  Principal Problem:   Rt occiptal ICH (intracerebral hemorrhage) ([place]) w/ SAH while on Eliquis-likely  recurrent hemorrhagic embolic stroke from AF despite anticoagulation Active Problems:   Hyperlipidemia with target LDL less than 70   Obstructive sleep apnea   Essential hypertension   Type 2 diabetes mellitus with hyperglycemia ([place])   Paroxysmal atrial fibrillation ([place]) - CHA2DS2Vasc = 7   Tobacco abuse   Seizures ([place]), secondsry to ICH   CKD (chronic kidney disease), stage III   Medications to be continued on Rehab Allergies as of 12/23/2019   No Known Allergies      Medication List     STOP taking these medications    Eliquis 5 MG Tabs tablet Generic drug: apixaban       TAKE these medications    Accu-Chek Aviva Plus test strip Generic drug: glucose blood Test 2 times daily dx e11.9   Accu-Chek Aviva Plus w/Device Kit Use blood glucose machine as instructed on your strips   accu-chek soft touch lancets Check blood sugars once per day. DX: E11.9   albuterol 108 (90 Base) MCG/ACT inhaler Commonly known as: Ventolin HFA Inhale 2 puffs into the lungs every 6 (six) hours as needed for wheezing or shortness of breath.   amiodarone 200 MG tablet Commonly known as: PACERONE Take 0.5 tablets (100 mg total) by mouth daily.   ascorbic acid 500 MG tablet Commonly known as: VITAMIN C Take 500 mg by mouth daily.   aspirin 81 MG EC tablet Take 1 tablet (81 mg total) by mouth daily. Swallow whole.   atorvastatin 10 MG tablet Commonly known as: LIPITOR TAKE 1 TABLET BY MOUTH  DAILY EACH EVENING What changed:  how much to take how to take this when to take this additional instructions   azelastine 0.1 % nasal spray Commonly known as: ASTELIN Place 1 spray into both nostrils 2 (two) times daily as needed for rhinitis or allergies.   budesonide-formoterol 160-4.5 MCG/ACT inhaler Commonly known as: Symbicort Inhale 2 puffs into the lungs 2 (two) times daily.   carvedilol 6.25 MG tablet Commonly known as: COREG Take 1 tablet (6.25 mg total) by mouth 2 (two) times daily with a meal.   colchicine 0.6 MG tablet TAKE 1 TABLET (0.6 MG TOTAL) BY MOUTH 2 (TWO) TIMES DAILY AS NEEDED (GOUT FLARE). What changed:  when to take this additional instructions   divalproex 500 MG DR tablet Commonly known as: DEPAKOTE TAKE 1 TABLET BY MOUTH EVERY 12 HOURS   furosemide 40 MG tablet Commonly known as: LASIX TAKE 1 TABLET (40 MG TOTAL) BY MOUTH 2 (TWO) TIMES DAILY WITH BREAKFAST AND LUNCH. What changed: when to take this   glimepiride 2 MG tablet Commonly known as: AMARYL Take 1 tablet (2 mg total) by mouth daily with breakfast.   losartan 50 MG tablet Commonly known as: COZAAR Take 1 tablet (50 mg total) by mouth daily.   metFORMIN 500 MG tablet Commonly known as: GLUCOPHAGE TAKE 2 TABLETS (1,000 MG TOTAL) BY MOUTH 2 (TWO) TIMES DAILY WITH A MEAL. What changed: See the new instructions.  montelukast 10 MG tablet Commonly known as: SINGULAIR TAKE 1 TABLET BY MOUTH EVERYDAY AT BEDTIME What changed: See the new instructions.   Myrbetriq 25 MG Tb24 tablet Generic drug: mirabegron ER Take 25 mg by mouth daily.   nicotine 14 mg/24hr patch Commonly known as: NICODERM CQ - dosed in mg/24 hours Place 1 patch (14 mg total) onto the skin daily.   PRESCRIPTION MEDICATION CPAP- At bedtime   senna-docusate 8.6-50 MG tablet Commonly known as: Senokot-S Take 1 tablet by mouth 2 (two) times daily.   Sutab [id number] MG Tabs Generic drug: Sodium Sulfate-Mag  Sulfate-KCl Take 1 kit by mouth as directed. BIN: [id number] PCN: CN GROUP: [id number] MEMBER ID: [id number];TMA AS CASH;NO PRIOR AUTHORIZATION   VITAMIN D-3 PO Take 1 capsule by mouth daily.   zinc gluconate 50 MG tablet Take 50 mg by mouth daily.        LABORATORY STUDIES CBC    Component Value Date/Time   WBC 6.0 12/22/2019 0848   RBC 5.28 12/22/2019 0848   HGB 16.6 12/22/2019 0848   HGB 15.9 07/07/2019 1124   HCT 49.6 12/22/2019 0848   HCT 46.2 07/07/2019 1124   PLT 190 12/22/2019 0848   PLT 182 07/07/2019 1124   MCV 93.9 12/22/2019 0848   MCV 95 07/07/2019 1124   MCH 31.4 12/22/2019 0848   MCHC 33.5 12/22/2019 0848   RDW 13.2 12/22/2019 0848   RDW 14.3 07/07/2019 1124   LYMPHSABS 1.5 12/17/2019 2003   LYMPHSABS 1.5 07/07/2019 1124   MONOABS 0.6 12/17/2019 2003   EOSABS 0.1 12/17/2019 2003   EOSABS 0.1 07/07/2019 1124   BASOSABS 0.1 12/17/2019 2003   BASOSABS 0.0 07/07/2019 1124   CMP    Component Value Date/Time   NA 132 (L) 12/22/2019 0353   NA 142 07/07/2019 1124   K 4.0 12/22/2019 0353   CL 98 12/22/2019 0353   CO2 23 12/22/2019 0353   GLUCOSE 124 (H) 12/22/2019 0353   BUN 22 12/22/2019 0353   BUN 18 07/07/2019 1124   CREATININE 1.26 (H) 12/22/2019 0353   CREATININE 0.96 03/21/2015 1022   CALCIUM 8.7 (L) 12/22/2019 0353   PROT 5.5 (L) 12/22/2019 0353   PROT 6.1 07/07/2019 1124   ALBUMIN 3.3 (L) 12/22/2019 0353   ALBUMIN 4.4 07/07/2019 1124   AST 16 12/22/2019 0353   ALT 9 12/22/2019 0353   ALKPHOS 52 12/22/2019 0353   BILITOT 1.2 12/22/2019 0353   BILITOT 0.6 07/07/2019 1124   GFRNONAA 56 (L) 12/22/2019 0353   GFRNONAA 80 03/21/2015 1022   GFRAA >60 12/22/2019 0353   GFRAA >89 03/21/2015 1022   COAGS Lab Results  Component Value Date   INR 1.0 12/17/2019   INR 1.1 01/03/2019   INR 1.0 11/21/2018   Lipid Panel    Component Value Date/Time   CHOL 125 12/18/2019 0644   TRIG 97 12/18/2019 0644   HDL 30 (L) 12/18/2019 0644   CHOLHDL 4.2  12/18/2019 0644   VLDL 19 12/18/2019 0644   LDLCALC 76 12/18/2019 0644   HgbA1C  Lab Results  Component Value Date   HGBA1C 6.3 (H) 12/18/2019   Urinalysis    Component Value Date/Time   COLORURINE YELLOW 12/17/2019 2102   APPEARANCEUR CLEAR 12/17/2019 2102   LABSPEC 1.025 12/17/2019 2102   PHURINE 7.0 12/17/2019 2102   GLUCOSEU NEGATIVE 12/17/2019 2102   GLUCOSEU NEGATIVE 01/22/2019 1304   HGBUR NEGATIVE 12/17/2019 2102   BILIRUBINUR NEGATIVE 12/17/2019 2102   BILIRUBINUR n 12/23/2018 1416  KETONESUR NEGATIVE 12/17/2019 2102   PROTEINUR NEGATIVE 12/17/2019 2102   UROBILINOGEN 0.2 01/22/2019 1304   NITRITE POSITIVE (A) 12/17/2019 2102   LEUKOCYTESUR MODERATE (A) 12/17/2019 2102   Urine Drug Screen     Component Value Date/Time   LABOPIA NONE DETECTED 12/17/2019 2102   COCAINSCRNUR NONE DETECTED 12/17/2019 2102   LABBENZ NONE DETECTED 12/17/2019 2102   AMPHETMU NONE DETECTED 12/17/2019 2102   THCU NONE DETECTED 12/17/2019 2102   LABBARB NONE DETECTED 12/17/2019 2102     SIGNIFICANT DIAGNOSTIC STUDIES CT Code Stroke CTA Head W/WO contrast  Result Date: 12/17/2019 CLINICAL DATA:  Initial evaluation for acute stroke. EXAM: CT ANGIOGRAPHY HEAD AND NECK TECHNIQUE: Multidetector CT imaging of the head and neck was performed using the standard protocol during bolus administration of intravenous contrast. Multiplanar CT image reconstructions and MIPs were obtained to evaluate the vascular anatomy. Carotid stenosis measurements (when applicable) are obtained utilizing NASCET criteria, using the distal internal carotid diameter as the denominator. CONTRAST:  74m OMNIPAQUE IOHEXOL 350 MG/ML SOLN COMPARISON:  Prior CTA from 09/24/2019. FINDINGS: CTA NECK FINDINGS Aortic arch: Aneurysmal dilatation of the ascending aorta up to 4.1 cm again noted, stable. Mild-to-moderate atheromatous change about the visualized arch and origin of the great vessels without significant stenosis. Right  carotid system: Right CCA widely patent to the bifurcation. Mild eccentric plaque at the proximal right ICA without significant stenosis. Right ICA widely patent distally without stenosis, dissection, or occlusion. Left carotid system: Left CCA widely patent to the bifurcation without stenosis. Mild eccentric calcified plaque about the proximal left ICA without stenosis. Left ICA widely patent distally without stenosis, dissection or occlusion. Vertebral arteries: Both vertebral arteries arise from the subclavian arteries. No significant proximal subclavian artery stenosis. Scattered plaque about the proximal right vertebral artery with associated mild multifocal narrowing, stable. Vertebral arteries otherwise widely patent without stenosis, dissection or occlusion. Scattered nonstenotic plaque noted within the distal right V3 segment. Skeleton: No visible acute osseous abnormality. No worrisome osseous lesions. Multilevel cervical spondylosis noted. Other neck: No other acute soft tissue abnormality within the neck. Upper chest: Visualized upper chest demonstrates no acute finding. Emphysema. Review of the MIP images confirms the above findings CTA HEAD FINDINGS Anterior circulation: Petrous segments widely patent bilaterally. Scattered atheromatous plaque within the carotid siphons with no more than mild multifocal stenosis. A1 segments widely patent. Normal anterior communicating artery complex. Anterior cerebral arteries widely patent to their distal aspects. No M1 stenosis or occlusion. Negative MCA bifurcations. Distal MCA branches well perfused and symmetric. Posterior circulation: Scattered calcified plaque within the V4 segments bilaterally with associated mild stenoses. Both picas patent. Basilar widely patent to its distal aspect without stenosis. Superior cerebral arteries patent bilaterally. Both PCAs primarily supplied via the basilar well perfused or distal aspects. Venous sinuses: Grossly patent  allowing for timing the contrast bolus. Anatomic variants: None significant. No aneurysm or evidence for vasculitis. No vascular abnormality seen underlying the right cerebral hemorrhage. Review of the MIP images confirms the above findings IMPRESSION: 1. Stable CTA of the head and neck. No large vessel occlusion. 2. No aneurysm or evidence for vasculitis. No vascular abnormality seen underlying the right cerebral hemorrhage. 3. Scattered atherosclerotic calcifications about the proximal right and distal vertebral arteries with associated mild multifocal stenoses, stable. 4. Aneurysmal dilatation of the ascending aorta up to 4.1 cm, stable. Recommend annual imaging followup by CTA or MRA. This recommendation follows 2010 ACCF/AHA/AATS/ACR/ASA/SCA/SCAI/SIR/STS/SVM Guidelines for the Diagnosis and Management of Patients with Thoracic Aortic Disease.  Circulation. 2010; 121: J500-X381. Aortic aneurysm NOS (ICD10-I71.9) 5. Aortic Atherosclerosis (ICD10-I70.0) and Emphysema (ICD10-J43.9). Electronically Signed   By: Jeannine Boga M.D.   On: 12/17/2019 21:11   CT HEAD WO CONTRAST  Result Date: 12/18/2019 CLINICAL DATA:  Stroke follow-up EXAM: CT HEAD WITHOUT CONTRAST TECHNIQUE: Contiguous axial images were obtained from the base of the skull through the vertex without intravenous contrast. COMPARISON:  12/17/2019 FINDINGS: Brain: Again seen is the right occipital hemorrhage and surrounding vasogenic edema measuring 4.0 x 2.8 cm, stable since prior study. Old right frontal infarct. No new areas of hemorrhage or infarction. No hydrocephalus. Diffuse cerebral atrophy and chronic small vessel disease. Vascular: No hyperdense vessel or unexpected calcification. Skull: No acute calvarial abnormality. Sinuses/Orbits: Visualized paranasal sinuses and mastoids clear. Orbital soft tissues unremarkable. Other: None IMPRESSION: Stable right occipital hemorrhage measuring up to 4 cm. Stable surrounding vasogenic edema.  Atrophy, chronic small vessel disease. Electronically Signed   By: Rolm Baptise M.D.   On: 12/18/2019 00:33   CT Code Stroke CTA Neck W/WO contrast  Result Date: 12/17/2019 CLINICAL DATA:  Initial evaluation for acute stroke. EXAM: CT ANGIOGRAPHY HEAD AND NECK TECHNIQUE: Multidetector CT imaging of the head and neck was performed using the standard protocol during bolus administration of intravenous contrast. Multiplanar CT image reconstructions and MIPs were obtained to evaluate the vascular anatomy. Carotid stenosis measurements (when applicable) are obtained utilizing NASCET criteria, using the distal internal carotid diameter as the denominator. CONTRAST:  74m OMNIPAQUE IOHEXOL 350 MG/ML SOLN COMPARISON:  Prior CTA from 09/24/2019. FINDINGS: CTA NECK FINDINGS Aortic arch: Aneurysmal dilatation of the ascending aorta up to 4.1 cm again noted, stable. Mild-to-moderate atheromatous change about the visualized arch and origin of the great vessels without significant stenosis. Right carotid system: Right CCA widely patent to the bifurcation. Mild eccentric plaque at the proximal right ICA without significant stenosis. Right ICA widely patent distally without stenosis, dissection, or occlusion. Left carotid system: Left CCA widely patent to the bifurcation without stenosis. Mild eccentric calcified plaque about the proximal left ICA without stenosis. Left ICA widely patent distally without stenosis, dissection or occlusion. Vertebral arteries: Both vertebral arteries arise from the subclavian arteries. No significant proximal subclavian artery stenosis. Scattered plaque about the proximal right vertebral artery with associated mild multifocal narrowing, stable. Vertebral arteries otherwise widely patent without stenosis, dissection or occlusion. Scattered nonstenotic plaque noted within the distal right V3 segment. Skeleton: No visible acute osseous abnormality. No worrisome osseous lesions. Multilevel cervical  spondylosis noted. Other neck: No other acute soft tissue abnormality within the neck. Upper chest: Visualized upper chest demonstrates no acute finding. Emphysema. Review of the MIP images confirms the above findings CTA HEAD FINDINGS Anterior circulation: Petrous segments widely patent bilaterally. Scattered atheromatous plaque within the carotid siphons with no more than mild multifocal stenosis. A1 segments widely patent. Normal anterior communicating artery complex. Anterior cerebral arteries widely patent to their distal aspects. No M1 stenosis or occlusion. Negative MCA bifurcations. Distal MCA branches well perfused and symmetric. Posterior circulation: Scattered calcified plaque within the V4 segments bilaterally with associated mild stenoses. Both picas patent. Basilar widely patent to its distal aspect without stenosis. Superior cerebral arteries patent bilaterally. Both PCAs primarily supplied via the basilar well perfused or distal aspects. Venous sinuses: Grossly patent allowing for timing the contrast bolus. Anatomic variants: None significant. No aneurysm or evidence for vasculitis. No vascular abnormality seen underlying the right cerebral hemorrhage. Review of the MIP images confirms the above findings IMPRESSION: 1.  Stable CTA of the head and neck. No large vessel occlusion. 2. No aneurysm or evidence for vasculitis. No vascular abnormality seen underlying the right cerebral hemorrhage. 3. Scattered atherosclerotic calcifications about the proximal right and distal vertebral arteries with associated mild multifocal stenoses, stable. 4. Aneurysmal dilatation of the ascending aorta up to 4.1 cm, stable. Recommend annual imaging followup by CTA or MRA. This recommendation follows 2010 ACCF/AHA/AATS/ACR/ASA/SCA/SCAI/SIR/STS/SVM Guidelines for the Diagnosis and Management of Patients with Thoracic Aortic Disease. Circulation. 2010; 121: T622-Q333. Aortic aneurysm NOS (ICD10-I71.9) 5. Aortic  Atherosclerosis (ICD10-I70.0) and Emphysema (ICD10-J43.9). Electronically Signed   By: Jeannine Boga M.D.   On: 12/17/2019 21:11   MR BRAIN WO CONTRAST  Result Date: 12/18/2019 CLINICAL DATA:  Follow-up examination for stroke. EXAM: MRI HEAD WITHOUT CONTRAST TECHNIQUE: Multiplanar, multiecho pulse sequences of the brain and surrounding structures were obtained without intravenous contrast. COMPARISON:  Prior CT from earlier the same day. FINDINGS: Brain: Generalized age-related cerebral atrophy with mild chronic microvascular ischemic disease. Chronic encephalomalacia involving the parasagittal anterior right frontal lobe related to prior hemorrhagic infarction. Previously identified hemorrhage centered at the right occipital pole again seen, relatively unchanged in size measuring 3.7 x 2.7 x 3.6 cm on this exam. Mild surrounding vasogenic edema without significant regional mass effect. No appreciable underlying lesion seen on this noncontrast examination. Approximate 1 cm acute ischemic infarct seen involving the subcortical left frontal lobe (series 5, image 83). No associated hemorrhage or mass effect. No other evidence for acute or subacute ischemia. Gray-white matter differentiation otherwise maintained. Innumerable chronic micro hemorrhages seen throughout both cerebral hemispheres, with multiple additional foci seen within the cerebellum. These are somewhat peripheral location, raising the possibility for cerebral amyloid angiopathy. Scattered hemosiderin staining at the parasagittal left parieto-occipital region consistent with prior hemorrhage. These changes are progressed as compared to 2020. No mass lesion or midline shift. No hydrocephalus or extra-axial fluid collection. No made of a partially empty sella. Midline structures intact. Vascular: Major intracranial vascular flow voids are maintained. Skull and upper cervical spine: Craniocervical junction within normal limits. Bone marrow signal  intensity normal. No scalp soft tissue abnormality. Sinuses/Orbits: Patient status post bilateral ocular lens replacement. Globes and orbital soft tissues demonstrate no acute finding. Paranasal sinuses are largely clear. No significant mastoid effusion. Other: None. IMPRESSION: 1. No significant interval change in the acute intraparenchymal right occipital hemorrhage. Mild surrounding edema without significant regional mass effect. 2. 1 cm acute ischemic nonhemorrhagic infarct involving the subcortical left frontal lobe. 3. Innumerable chronic micro hemorrhages throughout both cerebral hemispheres and cerebellum, progressed as compared to 2020. Findings are most suspicious for possible cerebral amyloid angiopathy given distribution. Sequelae of poorly controlled hypertension would be the primary differential consideration. 4. Underlying mild chronic microvascular ischemic disease with chronic right frontal encephalomalacia. Electronically Signed   By: Jeannine Boga M.D.   On: 12/18/2019 03:08   DG Chest Port 1 View  Result Date: 12/17/2019 CLINICAL DATA:  Hypoxia.  Code stroke. EXAM: PORTABLE CHEST 1 VIEW COMPARISON:  Chest radiograph 01/06/2019 FINDINGS: Stable cardiomegaly. Unchanged mediastinal contours. Aortic atherosclerosis. No acute airspace disease, pulmonary edema, large pleural effusion or pneumothorax. No acute osseous abnormalities are seen. IMPRESSION: Stable cardiomegaly. No acute chest findings. Electronically Signed   By: Keith Rake M.D.   On: 12/17/2019 21:51   ECHOCARDIOGRAM COMPLETE  Result Date: 12/18/2019    ECHOCARDIOGRAM REPORT   Patient Name:   ROBERTLEE ROGACKI Date of Exam: 12/18/2019 Medical Rec #:  [id number]  Height:       71.0 in Accession #:    [id number]        Weight:       243.4 lb Date of Birth:  March 25, 1946         BSA:          2.292 m Patient Age:    68 years          BP:           124/77 mmHg Patient Gender: M                 HR:           62 bpm. Exam  Location:  Inpatient Procedure: 2D Echo Indications:    stroke 434.91  History:        Patient has prior history of Echocardiogram examinations, most                 recent 01/04/2019. Cardiomyopathy and CHF, COPD,                 Arrythmias:Atrial Fibrillation; Risk Factors:Hypertension,                 Diabetes and Current Smoker.  Sonographer:    Jannett Celestine RDCS (AE) Referring Phys: [id number] [place]  1. Left ventricular ejection fraction, by estimation, is 60 to 65%. The left ventricle has normal function. The left ventricle has no regional wall motion abnormalities. There is severe asymmetric left ventricular hypertrophy. Left ventricular diastolic  parameters are indeterminate.  2. Right ventricular systolic function is normal. The right ventricular size is normal.  3. The mitral valve is normal in structure. No evidence of mitral valve regurgitation.  4. The aortic valve is normal in structure. Aortic valve regurgitation is not visualized. No aortic stenosis is present.  5. Aortic dilatation noted. There is moderate dilatation of the ascending aorta, measuring 40 mm. FINDINGS  Left Ventricle: Left ventricular ejection fraction, by estimation, is 60 to 65%. The left ventricle has normal function. The left ventricle has no regional wall motion abnormalities. The left ventricular internal cavity size was normal in size. There is  severe asymmetric left ventricular hypertrophy. Left ventricular diastolic parameters are indeterminate. Right Ventricle: The right ventricular size is normal. No increase in right ventricular wall thickness. Right ventricular systolic function is normal. Left Atrium: Left atrial size was normal in size. Right Atrium: Right atrial size was normal in size. Pericardium: There is no evidence of pericardial effusion. Mitral Valve: The mitral valve is normal in structure. No evidence of mitral valve regurgitation. Tricuspid Valve: The tricuspid valve is grossly normal.  Tricuspid valve regurgitation is not demonstrated. Aortic Valve: The aortic valve is normal in structure. Aortic valve regurgitation is not visualized. No aortic stenosis is present. Pulmonic Valve: The pulmonic valve was normal in structure. Pulmonic valve regurgitation is not visualized. Aorta: Aortic dilatation noted. There is moderate dilatation of the ascending aorta, measuring 40 mm. IAS/Shunts: The atrial septum is grossly normal.  LEFT VENTRICLE PLAX 2D LVIDd:         4.30 cm  Diastology LVIDs:         2.90 cm  LV e' medial:    5.33 cm/s LV PW:         1.50 cm  LV E/e' medial:  11.9 LV IVS:        1.90 cm  LV e' lateral:   7.51 cm/s LVOT diam:  2.30 cm  LV E/e' lateral: 8.4 LV SV:         87 LV SV Index:   38 LVOT Area:     4.15 cm  RIGHT VENTRICLE RV S prime:     13.70 cm/s TAPSE (M-mode): 2.2 cm LEFT ATRIUM             Index       RIGHT ATRIUM           Index LA diam:        4.00 cm 1.75 cm/m  RA Area:     15.30 cm LA Vol (A2C):   44.8 ml 19.55 ml/m RA Volume:   35.50 ml  15.49 ml/m LA Vol (A4C):   45.7 ml 19.94 ml/m LA Biplane Vol: 46.3 ml 20.20 ml/m  AORTIC VALVE LVOT Vmax:   86.20 cm/s LVOT Vmean:  59.300 cm/s LVOT VTI:    0.210 m  AORTA Ao Root diam: 4.00 cm MITRAL VALVE MV Area (PHT): 3.21 cm    SHUNTS MV Decel Time: 236 msec    Systemic VTI:  0.21 m MV E velocity: 63.40 cm/s  Systemic Diam: 2.30 cm MV A velocity: 60.80 cm/s MV E/A ratio:  1.04 Mertie Moores MD Electronically signed by Mertie Moores MD Signature Date/Time: 12/18/2019/2:13:51 PM    Final    CT HEAD CODE STROKE WO CONTRAST  Result Date: 12/17/2019 CLINICAL DATA:  Code stroke.  Initial evaluation for acute stroke. EXAM: CT HEAD WITHOUT CONTRAST TECHNIQUE: Contiguous axial images were obtained from the base of the skull through the vertex without intravenous contrast. COMPARISON:  Prior CT from 09/24/2019. FINDINGS: Brain: 3.9 x 2.7 x 3.9 cm acute intraparenchymal hemorrhage centered at the right parieto-occipital region  (estimated volume 21 cc). Mild surrounding vasogenic edema without significant regional mass effect. No other acute intracranial hemorrhage. No other acute large vessel territory infarct. Encephalomalacia of involving the parasagittal anterior right frontal lobe noted, consistent with known prior infarct in hemorrhage at this location. Underlying atrophy with chronic microvascular ischemic disease. No hydrocephalus or extra-axial fluid collection. Vascular: No hyperdense vessel. Calcified atherosclerosis present at the skull base. Skull: Scalp soft tissues within normal limits.  Calvarium intact. Sinuses/Orbits: Globes and orbital soft tissues demonstrate no acute finding. Mild mucosal thickening noted within the ethmoidal air cells. Paranasal sinuses are otherwise largely clear. No mastoid effusion. Other: None. IMPRESSION: 1. 21 cc acute intraparenchymal hemorrhage centered at the right parieto-occipital region. Mild surrounding vasogenic edema without significant regional mass effect. 2. Underlying atrophy with chronic small vessel ischemic disease with chronic right frontal encephalomalacia. These results were communicated to Dr. Curly Shores at 8:23 pmon 9/9/2021by text page via the [facility] messaging system. Electronically Signed   By: Jeannine Boga M.D.   On: 12/17/2019 20:26       HISTORY OF PRESENT ILLNESS TRAYLON SCHIMMING is a 74 y.o. male with a past medical history significant for hypertension, hyperlipidemia, atrial fibrillation (currently on Eliquis for PE), COPD, left frontal hemorrhagic stroke (February 2020, secondary to Eliquis, mild residual right leg weakness, complicated by seizure now on Depakote), unprovoked pulmonary embolus (restarted on Eliquis in September 2020), and ongoing tobacco abuse. He reports that he was driving home when he started to have difficulty seeing out of the left side of his vision and he was additionally having a severe headache.  There was additionally perhaps some  worsening of his chronic left leg weakness.  The headache improved but due to persistent vision issues and his wife  noticing that he seemed confused, EMS was activated.  EMS reported an initial blood glucose of 112 (similar to obtained here), blood pressures of 160s over 110s, and saturating 92% on room air for which 4 L of nasal cannula started with improvement in his oxygenation. Head CT revealed an acute intraparenchymal hemorrhage.  His last dose of apixaban was in the morning.  Given his overall clinical stability and ICH score of 0 he was not felt to be a candidate for Andexxa but was given Kcentra.  Blood pressure was refractory to IV labetalol and therefore nifedipine drip was started with good blood pressure control achieved.  His oxygen saturation did improve to the mid-high 90s on room air, though it dropped again intermittently while he was sleeping and not wearing his CPAP. He was LKW 12/17/2019 at 1300. Premorbid modified rankin scale: 1. ICH Score: 0.     HOSPITAL COURSE Mr. KENYADA HY is a 74 y.o. male with history of hypertension, hyperlipidemia, COPD, DM, hearing impaired, cardiomyopathy, CHF hx, atrial fibrillation (currently on Eliquis for PE as well), COPD, left frontal hemorrhagic stroke (February 2020, secondary to Eliquis, mild residual right leg weakness, complicated by seizure (now on Depakote), unprovoked pulmonary embolus (restarted on Eliquis in September 2020), and ongoing tobacco abuse presenting with visual difficulties, severe headache, worsening of his chronic left leg weakness and confusion.    ICH: acute ICH at the right parieto-occipital region, possible hemorrhagic infarct secondary to Eliquis additional small acute embolic infarct at subcortical left frontal lobe, recurrent infarcts felt to be due to failure of AC with hemorrhagic transformation- embolic source is afib   CT Head - 21 cc acute intraparenchymal hemorrhage centered at the right parieto-occipital  region.    CT head - Stable right occipital hemorrhage measuring up to 4 cm. Stable surrounding vasogenic edema.  CTA H&N -  Stable CTA of the head and neck. No large vessel occlusion. No aneurysm or evidence for vasculitis. No vascular abnormality seen underlying the right cerebral hemorrhage. MRI head -  No significant interval change in the acute intraparenchymal right occipital hemorrhage. 1 cm acute ischemic nonhemorrhagic infarct involving the subcortical left frontal lobe. Innumerable chronic micro hemorrhages throughout both cerebral hemispheres and cerebellum, progressed as compared to 2020. Findings are most suspicious for possible cerebral amyloid angiopathy given distribution. Sequelae of poorly controlled hypertension would be the primary differential consideration.  2D Echo EF 60-65% [employer] Virus 2 - negative LDL 76 HgbA1c 6.3 UDS - negative Eliquis (apixaban) daily prior to admission, now on No antithrombotic due to [place]. Restarted low dose aspirin 81 on post-stroke D#5.  Therapy recommendations:    CIR  -> no bed at [facility] so TOC has referred to [facility]  Disposition:  [facility] Patient with 2 hemorrhagic infarcts on [facility]   Hx of ICH and seizure 10/2018 admitted for right frontal ICH in the setting of Eliquis.  Status post Kcentra reversal.  CTA head and neck no LVO.  CTV negative.  MRI right parafalcine frontal ICH with edema and local mass-effect.  No CAA identified.  EF more than 65%.  EEG normal.  LDL 77 and A1c 6.9.  Had GTC seizure on presentation, loaded with Keppra.  EEG normal.  Initially BP was high and treated with Cleviprex later tapered off.  He was discharged with aspirin 81 and Keppra.  Resume Lipitor 10.  Patient and wife refused SATURN trial. 11/2018 admitted for seizure.  Keppra switched to Depakote due to possible side effect  of Keppra causing confusion.  MRI with and without contrast no acute abnormality 12/2018 admitted for PE.  Was put on heparin drip and then  switched to Eliquis. Now On Depakote, continue - depakote level 56->29   (therapeutic range 50 - 100) - unknown reason for drop, no seizure, will continue medication regimen as it Follow with Dr. Jannifer Franklin at [facility].   Possible CAA ( cerebral amyloid angiopathy ) Lobar ICH in 10/2018 and this admission Hypertensive in 10/2018 but no significant hypertension this admission MRI in 10/2018 did not show clear CAA but this admission MRI concerning for possible CAA Given patient recurrent lobar ICH and possible CAA, anticoagulation may be discontinued.  However, patient had A. fib and developed a PE in 12/2018 without anticoagulation, making anticoagulation necessary.     PAF Hx of PE in the absence of anticoagulation 12/2018 On eliquis 19m bid PTA [facility] as he is not longer a long-term AC candidate.  Watchman coordinator to reach out for more details and schedule CT chest as an OP following rehab stay.   Hypertension Home BP meds: Cozaar ; Coreg  Current BP meds: Cozaar ; Coreg resumed Treated w/ cardene in ICU Stable now Long-term BP goal normotensive   Hyperlipidemia Home Lipid lowering medication: Lipitor 10 mg daily  LDL - 76, goal < 70 Currently lipitor on hold given acute ICH Continue statin at discharge   Diabetes type II, controlled Home diabetic meds: metformin Current diabetic meds: none HgbA1c 6.3, goal < 7.0 CBG monitoring Resumed home DB meds Close PCP follow up   Tobacco abuse Current smoker Smoking cessation counseling provided Pt is willing to quit   Other Stroke Risk Factors Advanced age Previous ETOH use Cigarette smoker, on nicotine patch, counseled to quit Obesity, Body mass index is 33.95 kg/m., recommend weight loss, diet and exercise as appropriate  Family hx stroke (mother)  OSA on CPAP, refused CPAP most nights in hospital CHF hx   Other Active Problems Aneurysmal dilatation of the ascending  aorta up to 4.1 cm, stable. Recommend annual imaging followup by CTA or MRA  Aortic Atherosclerosis (ICD10-I70.0) Emphysema (([id number])  CKD - stage 3a - creatinine - 1.35->1.40->1.38->1.50 Hypokalemia K 3.4-supplement ->3.7 Pt and wife declined SATURN trial.  DISCHARGE EXAM Blood pressure (!) 176/116, pulse 65, temperature 97.8 F (36.6 C), temperature source Oral, resp. rate 16, height '5\' 11"'  (1.803 m), weight 110.4 kg, SpO2 97 %. General -obese elderly Caucasian male not in distress. Ophthalmologic - fundi not visualized due to noncooperation. Cardiovascular - Regular rhythm and rate. Neurological Exam ;  Awake  Alert oriented x 3. Normal speech and language.eye movements full without nystagmus.fundi were not visualized. Vision acuity  appears normal.  But dense left homonymous hemianopsia.  Hearing is normal. Palatal movements are normal. Face symmetric. Tongue midline. Normal strength, tone, reflexes and coordination. Normal sensation. Gait deferred.  Discharge Diet  Heart healthy thin liquids  DISCHARGE PLAN [place]:  [place] [place] and [facility] aspirin 81 mg daily for secondary stroke prevention.  For OP evaluation by Dr Lambert[facility] as [facility] not a long-term AC candidate for AF, PE. May reinitiate eliquis at 6 weeks post stroke for short period 2-3 months for watchman procedure Recommend ongoing stroke risk factor control by Primary Care Physician at time of discharge from inpatient rehabilitation. Follow-up PCP Burchette, BAlinda Sierras MD in 2 weeks following discharge from rehab. Follow-up  in [facility] in 4 weeks following discharge from rehab, office to schedule an appointment.   35 minutes were spent preparing discharge.  Burnetta Sabin, MSN, APRN, ANVP-BC, AGPCNP-BC Advanced Practice Stroke Nurse [place] for Schedule & Pager information 12/23/2019 8:37 AM   I have personally obtained history,examined this patient, reviewed notes, independently viewed imaging studies, participated in medical decision making and plan of care.ROS completed by me personally and pertinent positives fully documented  I have made any additions or clarifications directly to the above note. Agree with note above.    Antony Contras, MD Medical Director [facility] Pager: [phone] 12/23/2019 11:18 AM

## 2019-12-22 NOTE — Progress Notes (Signed)
Physical Therapy Treatment Patient Details Name: Ronald [name] MRN: [id number] DOB: 1945/07/15 Today's Date: 12/22/2019    History of Present Illness Ronald [name] is a 74 y.o. male with a past medical history significant for hypertension, hyperlipidemia, atrial fibrillation, COPD, left frontal hemorrhagic stroke (February 2020, mild residual right leg weakness, complicated by seizure--went to CIR), unprovoked pulmonary embolus, and ongoing tobacco abuse. CT: Acute intraparenchymal hemorrhage centered at the rightparieto-occipital region. Mild surrounding vasogenic edema withoutsignificant regional mass effect.    PT Comments    Patient progressing slowly towards PT goals. Tried using RW for gait training today which seemed to help with balance and stability when navigating environment. Balance greatly affected by vision. Worked on trying to locate things in hallway esp in left visual field needing cues to adjust positioning.  Performed ADL session at sink needing assist to locate things on left and Min A for balance due to sway and posterior lean on a few occasions. Recommend using RW for all mobility at this time. Will continue to follow.  Follow Up Recommendations  CIR     Equipment Recommendations  None recommended by PT    Recommendations for Other Services       Precautions / Restrictions Precautions Precautions: Fall;Other (comment) Precaution Comments: impaired vision (only can see out of 1/4 of each eye --rt upper quadrant) Restrictions Weight Bearing Restrictions: No    Mobility  Bed Mobility               General bed mobility comments: Sitting EOB upon PT arrival.  Transfers Overall transfer level: Needs assistance Equipment used: Rolling walker (2 wheeled) Transfers: Sit to/from Stand Sit to Stand: Min assist;Min guard         General transfer comment: Min guard-Min A for safety. Cues for hand placement and not to pull up on RW. Min A at times to  steady due to posterior lean/LOB.  Ambulation/Gait Ambulation/Gait assistance: Min assist Gait Distance (Feet): 150 Feet Assistive device: Rolling walker (2 wheeled) Gait Pattern/deviations: Step-through pattern;Decreased stride length;Drifts right/left;Staggering left;Staggering right Gait velocity: decreased   General Gait Details: Slow, mildly unsteady gait with instability noted esp with head turns and scanning environment. Having pt read various signs down hallways to get pt to attend/find things in left visual field. Needed cues to adjust positioning to find correct nmber of green lights. Min A for balance and for RW management.   Stairs             Wheelchair Mobility    Modified Rankin (Stroke Patients Only) Modified Rankin (Stroke Patients Only) Pre-Morbid Rankin Score: No significant disability Modified Rankin: Moderately severe disability     Balance Overall balance assessment: Needs assistance Sitting-balance support: No upper extremity supported;Feet supported Sitting balance-Leahy Scale: Good     Standing balance support: During functional activity Standing balance-Leahy Scale: Poor Standing balance comment: Swaying with static standing needing Min A at times to stabilize and stood at sink performing ADLs needing Min A for balance, dificulty finding things onl eft side of sink needing cues.                            Cognition Arousal/Alertness: Awake/alert Behavior During Therapy: WFL for tasks assessed/performed Overall Cognitive Status: Impaired/Different from baseline Area of Impairment: Safety/judgement;Awareness                         Safety/Judgement: Decreased awareness of safety  Awareness: Anticipatory   General Comments: due to vision, working on becoming more aware and learning to acclimate/turn his head to compensate for left visual field but sometimes needs cues to scan environment.      Exercises      General  Comments        Pertinent Vitals/Pain Pain Assessment: No/denies pain    Home Living                      Prior Function            PT Goals (current goals can now be found in the care plan section) Progress towards PT goals: Progressing toward goals    Frequency    Min 4X/week      PT Plan Current plan remains appropriate    Co-evaluation              AM-PAC PT "6 Clicks" Mobility   Outcome Measure  Help needed turning from your back to your side while in a flat bed without using bedrails?: None Help needed moving from lying on your back to sitting on the side of a flat bed without using bedrails?: None Help needed moving to and from a bed to a chair (including a wheelchair)?: A Little Help needed standing up from a chair using your arms (e.g., wheelchair or bedside chair)?: A Little Help needed to walk in hospital room?: A Little Help needed climbing 3-5 steps with a railing? : A Little 6 Click Score: 20    End of Session Equipment Utilized During Treatment: Gait belt Activity Tolerance: Patient tolerated treatment well Patient left: in chair;with call bell/phone within reach;with chair alarm set Nurse Communication: Mobility status PT Visit Diagnosis: Unsteadiness on feet (R26.81);Difficulty in walking, not elsewhere classified (R26.2)     Time: 1007-1219 PT Time Calculation (min) (ACUTE ONLY): 27 min  Charges:  $Gait Training: 8-22 mins $Therapeutic Activity: 8-22 mins                     Marisa Severin, PT, DPT Acute Rehabilitation Services Pager [phone] Office [place] 12/22/2019, 10:51 AM

## 2019-12-23 ENCOUNTER — Ambulatory Visit: Payer: Federal, State, Local not specified - PPO | Admitting: Neurology

## 2019-12-23 DIAGNOSIS — F17200 Nicotine dependence, unspecified, uncomplicated: Secondary | ICD-10-CM | POA: Diagnosis present

## 2019-12-23 DIAGNOSIS — Z9989 Dependence on other enabling machines and devices: Secondary | ICD-10-CM | POA: Diagnosis not present

## 2019-12-23 DIAGNOSIS — I130 Hypertensive heart and chronic kidney disease with heart failure and stage 1 through stage 4 chronic kidney disease, or unspecified chronic kidney disease: Secondary | ICD-10-CM | POA: Diagnosis present

## 2019-12-23 DIAGNOSIS — R9431 Abnormal electrocardiogram [ECG] [EKG]: Secondary | ICD-10-CM | POA: Diagnosis not present

## 2019-12-23 DIAGNOSIS — J449 Chronic obstructive pulmonary disease, unspecified: Secondary | ICD-10-CM | POA: Diagnosis not present

## 2019-12-23 DIAGNOSIS — I69298 Other sequelae of other nontraumatic intracranial hemorrhage: Secondary | ICD-10-CM | POA: Diagnosis not present

## 2019-12-23 DIAGNOSIS — Z720 Tobacco use: Secondary | ICD-10-CM | POA: Diagnosis not present

## 2019-12-23 DIAGNOSIS — I615 Nontraumatic intracerebral hemorrhage, intraventricular: Secondary | ICD-10-CM | POA: Diagnosis not present

## 2019-12-23 DIAGNOSIS — N3000 Acute cystitis without hematuria: Secondary | ICD-10-CM | POA: Diagnosis not present

## 2019-12-23 DIAGNOSIS — G9340 Encephalopathy, unspecified: Secondary | ICD-10-CM | POA: Diagnosis present

## 2019-12-23 DIAGNOSIS — G4733 Obstructive sleep apnea (adult) (pediatric): Secondary | ICD-10-CM | POA: Diagnosis not present

## 2019-12-23 DIAGNOSIS — E1165 Type 2 diabetes mellitus with hyperglycemia: Secondary | ICD-10-CM | POA: Diagnosis present

## 2019-12-23 DIAGNOSIS — I712 Thoracic aortic aneurysm, without rupture: Secondary | ICD-10-CM | POA: Diagnosis present

## 2019-12-23 DIAGNOSIS — E1122 Type 2 diabetes mellitus with diabetic chronic kidney disease: Secondary | ICD-10-CM | POA: Diagnosis not present

## 2019-12-23 DIAGNOSIS — E785 Hyperlipidemia, unspecified: Secondary | ICD-10-CM | POA: Diagnosis not present

## 2019-12-23 DIAGNOSIS — Z86718 Personal history of other venous thrombosis and embolism: Secondary | ICD-10-CM | POA: Diagnosis not present

## 2019-12-23 DIAGNOSIS — Z7409 Other reduced mobility: Secondary | ICD-10-CM | POA: Diagnosis not present

## 2019-12-23 DIAGNOSIS — R410 Disorientation, unspecified: Secondary | ICD-10-CM | POA: Diagnosis not present

## 2019-12-23 DIAGNOSIS — G9389 Other specified disorders of brain: Secondary | ICD-10-CM | POA: Diagnosis present

## 2019-12-23 DIAGNOSIS — E871 Hypo-osmolality and hyponatremia: Secondary | ICD-10-CM | POA: Diagnosis present

## 2019-12-23 DIAGNOSIS — I110 Hypertensive heart disease with heart failure: Secondary | ICD-10-CM | POA: Diagnosis not present

## 2019-12-23 DIAGNOSIS — I480 Paroxysmal atrial fibrillation: Secondary | ICD-10-CM | POA: Diagnosis not present

## 2019-12-23 DIAGNOSIS — J309 Allergic rhinitis, unspecified: Secondary | ICD-10-CM | POA: Diagnosis present

## 2019-12-23 DIAGNOSIS — I619 Nontraumatic intracerebral hemorrhage, unspecified: Secondary | ICD-10-CM | POA: Diagnosis not present

## 2019-12-23 DIAGNOSIS — G40909 Epilepsy, unspecified, not intractable, without status epilepticus: Secondary | ICD-10-CM | POA: Diagnosis not present

## 2019-12-23 DIAGNOSIS — I5043 Acute on chronic combined systolic (congestive) and diastolic (congestive) heart failure: Secondary | ICD-10-CM | POA: Diagnosis present

## 2019-12-23 DIAGNOSIS — R569 Unspecified convulsions: Secondary | ICD-10-CM | POA: Diagnosis not present

## 2019-12-23 DIAGNOSIS — I509 Heart failure, unspecified: Secondary | ICD-10-CM | POA: Diagnosis not present

## 2019-12-23 DIAGNOSIS — I4811 Longstanding persistent atrial fibrillation: Secondary | ICD-10-CM | POA: Diagnosis present

## 2019-12-23 DIAGNOSIS — I429 Cardiomyopathy, unspecified: Secondary | ICD-10-CM | POA: Diagnosis present

## 2019-12-23 DIAGNOSIS — Z86711 Personal history of pulmonary embolism: Secondary | ICD-10-CM | POA: Diagnosis not present

## 2019-12-23 DIAGNOSIS — J84115 Respiratory bronchiolitis interstitial lung disease: Secondary | ICD-10-CM | POA: Diagnosis present

## 2019-12-23 DIAGNOSIS — F05 Delirium due to known physiological condition: Secondary | ICD-10-CM | POA: Diagnosis present

## 2019-12-23 DIAGNOSIS — C61 Malignant neoplasm of prostate: Secondary | ICD-10-CM | POA: Diagnosis present

## 2019-12-23 DIAGNOSIS — J440 Chronic obstructive pulmonary disease with acute lower respiratory infection: Secondary | ICD-10-CM | POA: Diagnosis present

## 2019-12-23 DIAGNOSIS — N183 Chronic kidney disease, stage 3 unspecified: Secondary | ICD-10-CM | POA: Diagnosis not present

## 2019-12-23 DIAGNOSIS — E669 Obesity, unspecified: Secondary | ICD-10-CM | POA: Diagnosis present

## 2019-12-23 DIAGNOSIS — I5030 Unspecified diastolic (congestive) heart failure: Secondary | ICD-10-CM | POA: Diagnosis not present

## 2019-12-23 LAB — VALPROIC ACID LEVEL: Valproic Acid Lvl: 29 ug/mL — ABNORMAL LOW (ref 50.0–100.0)

## 2019-12-23 LAB — GLUCOSE, CAPILLARY: Glucose-Capillary: 129 mg/dL — ABNORMAL HIGH (ref 70–99)

## 2019-12-23 MED ORDER — SENNOSIDES-DOCUSATE SODIUM 8.6-50 MG PO TABS: 1.0000 | ORAL_TABLET | Freq: Two times a day (BID) | ORAL | Status: DC

## 2019-12-23 MED ORDER — ASPIRIN 81 MG PO TBEC: 81.0000 mg | DELAYED_RELEASE_TABLET | Freq: Every day | ORAL | 11 refills | Status: AC

## 2019-12-23 MED ORDER — NICOTINE 14 MG/24HR TD PT24: 14.0000 mg | MEDICATED_PATCH | Freq: Every day | TRANSDERMAL | 0 refills | Status: DC

## 2019-12-23 NOTE — Progress Notes (Signed)
Occupational Therapy Treatment Patient Details Name: Ronald [name] MRN: [id number] DOB: 08-18-1945 Today's Date: 12/23/2019    History of present illness Ronald [name] is a 74 y.o. male with a past medical history significant for hypertension, hyperlipidemia, atrial fibrillation, COPD, left frontal hemorrhagic stroke (February 2020, mild residual right leg weakness, complicated by seizure--went to CIR), unprovoked pulmonary embolus, and ongoing tobacco abuse. CT: Acute intraparenchymal hemorrhage centered at the rightparieto-occipital region. Mild surrounding vasogenic edema withoutsignificant regional mass effect.   OT comments  Pt progressing towards OT goals, focus on functional grooming tasks on sink - utilizing clock locations for visual cues, and scanning strategies. Pt with decreased balance in static standing and utilizing sink to lean against for stability. Encouraged Pt to use RW for safety and balance with mobility. Current POC remains appropriate.    Follow Up Recommendations  CIR;Supervision/Assistance - 24 hour    Equipment Recommendations  None recommended by OT (defer to next venue of care)    Recommendations for Other Services      Precautions / Restrictions Precautions Precautions: Fall;Other (comment) Precaution Comments: impaired vision (only can see out of 1/4 of each eye --rt upper quadrant) Restrictions Weight Bearing Restrictions: No       Mobility Bed Mobility               General bed mobility comments: OOB in recliner at beginning and end of session  Transfers Overall transfer level: Needs assistance Equipment used: Rolling walker (2 wheeled) Transfers: Sit to/from Stand Sit to Stand: Min assist;Min guard         General transfer comment: Min guard-Min A for safety. Cues for hand placement and not to pull up on RW. Min A at times to steady due to posterior lean/LOB.    Balance Overall balance assessment: Needs  assistance Sitting-balance support: No upper extremity supported;Feet supported Sitting balance-Leahy Scale: Good     Standing balance support: During functional activity Standing balance-Leahy Scale: Poor Standing balance comment: min guard for static standing leaning against sink at times to stabilize and performing grooming tasks                           ADL either performed or assessed with clinical judgement   ADL Overall ADL's : Needs assistance/impaired     Grooming: Wash/dry hands;Oral care;Min guard;Standing Grooming Details (indicate cue type and reason): cues for visual compensatory strategies locating items, educated on using clock locations as a Health and safety inspector: Minimal assistance;Ambulation;RW Toilet Transfer Details (indicate cue type and reason): RW for navigating environment in conjunction with scanning strategies for vision         Functional mobility during ADLs: Minimal assistance;Rolling walker       Vision   Additional Comments: functional tasks at sink today, educated Pt in clock locations and utilized these cues for locating grooming items around the sink. Pt demonstrating head turns and tilts as well as mirror. Proprioception continues to improve   Perception     Praxis      Cognition Arousal/Alertness: Awake/alert Behavior During Therapy: WFL for tasks assessed/performed Overall Cognitive Status: Impaired/Different from baseline Area of Impairment: Safety/judgement;Awareness                     Memory: Decreased short-term memory   Safety/Judgement: Decreased awareness of safety Awareness: Anticipatory   General Comments:  due to vision, working on becoming more aware and learning to acclimate/turn his head to compensate for left visual field but sometimes needs cues to scan environment.; also educated on using tools as a way to improve safety and not worrying about the way it "looks"         Exercises     Shoulder Instructions       General Comments      Pertinent Vitals/ Pain       Pain Assessment: No/denies pain  Home Living                                          Prior Functioning/Environment              Frequency  Min 2X/week        Progress Toward Goals  OT Goals(current goals can now be found in the care plan section)  Progress towards OT goals: Progressing toward goals  Acute Rehab OT Goals Patient Stated Goal: to be able to see/walk better OT Goal Formulation: With patient/family Time For Goal Achievement: 01/01/20 Potential to Achieve Goals: Good  Plan Discharge plan remains appropriate    Co-evaluation                 AM-PAC OT "6 Clicks" Daily Activity     Outcome Measure   Help from another person eating meals?: None Help from another person taking care of personal grooming?: A Little Help from another person toileting, which includes using toliet, bedpan, or urinal?: A Little Help from another person bathing (including washing, rinsing, drying)?: A Little Help from another person to put on and taking off regular upper body clothing?: A Little Help from another person to put on and taking off regular lower body clothing?: A Little 6 Click Score: 19    End of Session Equipment Utilized During Treatment: Gait belt;Rolling walker  OT Visit Diagnosis: Unsteadiness on feet (R26.81);Other abnormalities of gait and mobility (R26.89);History of falling (Z91.81);Low vision, both eyes (H54.2)   Activity Tolerance Patient tolerated treatment well   Patient Left in chair;with call bell/phone within reach;with chair alarm set;with family/visitor present (wife just arrived at end of session)   Nurse Communication Mobility status        Time: 2423-5361 OT Time Calculation (min): 11 min  Charges: OT General Charges $OT Visit: 1 Visit OT Treatments $Self Care/Home Management : 8-22 mins  Jesse Sans  OTR/L Acute Rehabilitation Services Pager: [phone] Office: [place] 12/23/2019, 10:41 AM

## 2019-12-23 NOTE — TOC Transition Note (Signed)
Transition of Care [facility]) - CM/SW Discharge Note   Patient Details  Name: Ronald [name] MRN: [id number] Date of Birth: 08-31-1945  Transition of Care [facility]) CM/SW Contact:  Marguerita Merles, [place] Work Phone Number: 12/23/2019, 10:28 AM   Clinical Narrative:   Nurse to call report to [phone] Rm. [id number]    Final next level of care: IP Rehab Facility Barriers to Discharge: Barriers Resolved   Patient Goals and CMS Choice        Discharge Placement              Patient chooses bed at:  ([facility]) Patient to be transferred to facility by: [facility] Name of family member notified: Self Patient and family notified of of transfer: 12/23/19  Discharge Plan and Services                                     Social Determinants of Health (SDOH) Interventions     Readmission Risk Interventions No flowsheet data found.

## 2019-12-23 NOTE — Progress Notes (Signed)
Report called and given to Jiles Garter, Therapist, sports at [facility].

## 2019-12-23 NOTE — Progress Notes (Signed)
Physical Therapy Treatment Patient Details Name: Ronald [name] MRN: [id number] DOB: 07/14/1945 Today's Date: 12/23/2019    History of Present Illness ATILLA [name] is a 74 y.o. male with a past medical history significant for hypertension, hyperlipidemia, atrial fibrillation, COPD, left frontal hemorrhagic stroke (February 2020, mild residual right leg weakness, complicated by seizure--went to CIR), unprovoked pulmonary embolus, and ongoing tobacco abuse. CT: Acute intraparenchymal hemorrhage centered at the rightparieto-occipital region. Mild surrounding vasogenic edema withoutsignificant regional mass effect.    PT Comments    Pt progressing towards physical therapy goals. Session limited today as EMS arrived to transport pt to CIR. Focus of session was to complete DGI. Pt scored 11/24 on the DGI, indicating he is still at a high risk for falls. Noted increased difficulty with some components of the DGI due to multi-step commands, and decreased vision. VC's required for object finding in room (soap and paper towel dispensers while washing hands). Pt able to tolerate perturbations while statically standing, and consistent LOB with posterior perturbations and assist required to recover. Pt being d/c'd as session ended and anticipate we will no longer be seeing acutely.    Follow Up Recommendations  CIR     Equipment Recommendations  None recommended by PT    Recommendations for Other Services Rehab consult     Precautions / Restrictions Precautions Precautions: Fall Precaution Comments: impaired vision (only can see out of 1/4 of each eye --rt upper quadrant) Restrictions Weight Bearing Restrictions: No    Mobility  Bed Mobility               General bed mobility comments: OOB in recliner at beginning and end of session  Transfers Overall transfer level: Needs assistance Equipment used: None Transfers: Sit to/from Stand Sit to Stand: Min guard         General  transfer comment: Min guard for safety. Cues for hand placement and not to pull up on RW. Increased time to gain/maintain standing balance prior to initiating ambulation.   Ambulation/Gait Ambulation/Gait assistance: Min assist Gait Distance (Feet): 200 Feet Assistive device: None Gait Pattern/deviations: Step-through pattern;Decreased stride length;Drifts right/left;Staggering left;Staggering right Gait velocity: decreased Gait velocity interpretation: <1.31 ft/sec, indicative of household ambulator General Gait Details: Gait training focused on completing DGI. Min assist throughout - more frequent assist required during balance activities   Stairs Stairs: Yes Stairs assistance: Min guard Stair Management: Alternating pattern;Two rails Number of Stairs: 5 General stair comments: Uncoordinated - likely due to vision   Wheelchair Mobility    Modified Rankin (Stroke Patients Only) Modified Rankin (Stroke Patients Only) Pre-Morbid Rankin Score: No significant disability Modified Rankin: Moderately severe disability     Balance Overall balance assessment: Needs assistance Sitting-balance support: No upper extremity supported;Feet supported Sitting balance-Leahy Scale: Good     Standing balance support: During functional activity Standing balance-Leahy Scale: Poor Standing balance comment: Unsteady and assist required for dynamic standing activity.                  Standardized Balance Assessment Standardized Balance Assessment : Dynamic Gait Index   Dynamic Gait Index Level Surface: Mild Impairment Change in Gait Speed: Moderate Impairment Gait with Horizontal Head Turns: Moderate Impairment Gait with Vertical Head Turns: Moderate Impairment Gait and Pivot Turn: Mild Impairment Step Over Obstacle: Moderate Impairment Step Around Obstacles: Moderate Impairment Steps: Mild Impairment Total Score: 11      Cognition Arousal/Alertness: Awake/alert Behavior During  Therapy: WFL for tasks assessed/performed Overall Cognitive Status: Impaired/Different  from baseline Area of Impairment: Safety/judgement;Awareness;Memory;Following commands                     Memory: Decreased short-term memory Following Commands: Follows multi-step commands inconsistently Safety/Judgement: Decreased awareness of safety Awareness: Anticipatory   General Comments: due to vision, working on becoming more aware and learning to acclimate/turn his head to compensate for left visual field but sometimes needs cues to scan environment.; also educated on using tools as a way to improve safety and not worrying about the way it "looks"      Exercises      General Comments        Pertinent Vitals/Pain Pain Assessment: No/denies pain    Home Living                      Prior Function            PT Goals (current goals can now be found in the care plan section) Acute Rehab PT Goals Patient Stated Goal: To CIR today PT Goal Formulation: With patient/family Time For Goal Achievement: 01/01/20 Potential to Achieve Goals: Good Progress towards PT goals: Progressing toward goals    Frequency    Min 4X/week      PT Plan Current plan remains appropriate    Co-evaluation              AM-PAC PT "6 Clicks" Mobility   Outcome Measure  Help needed turning from your back to your side while in a flat bed without using bedrails?: None Help needed moving from lying on your back to sitting on the side of a flat bed without using bedrails?: None Help needed moving to and from a bed to a chair (including a wheelchair)?: A Little Help needed standing up from a chair using your arms (e.g., wheelchair or bedside chair)?: A Little Help needed to walk in hospital room?: A Little Help needed climbing 3-5 steps with a railing? 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

## 2019-12-23 NOTE — Progress Notes (Signed)
Patient DC to [facility] via [facility].  Spouse collected his belongings and will follow to facility.  No changes in vital signs or assessments.

## 2019-12-23 NOTE — Plan of Care (Signed)
°  Problem: Education: Goal: Knowledge of disease or condition will improve Outcome: Progressing Goal: Knowledge of secondary prevention will improve Outcome: Progressing Goal: Knowledge of patient specific risk factors addressed and post discharge goals established will improve Outcome: Progressing Goal: Individualized Educational Video(s) Outcome: Progressing   Problem: Coping: Goal: Will verbalize positive feelings about self Outcome: Progressing Goal: Will identify appropriate support needs Outcome: Progressing   Problem: Self-Care: Goal: Ability to participate in self-care as condition permits will improve Outcome: Progressing   Problem: Education: Goal: Knowledge of General Education information will improve Description: Including pain rating scale, medication(s)/side effects and non-pharmacologic comfort measures Outcome: Progressing   Problem: Health Behavior/Discharge Planning: Goal: Ability to manage health-related needs will improve Outcome: Progressing   Problem: Clinical Measurements: Goal: Ability to maintain clinical measurements within normal limits will improve Outcome: Progressing Goal: Will remain free from infection Outcome: Progressing Goal: Diagnostic test results will improve Outcome: Progressing Goal: Respiratory complications will improve Outcome: Progressing Goal: Cardiovascular complication will be avoided Outcome: Progressing   Problem: Activity: Goal: Risk for activity intolerance will decrease Outcome: Progressing   Problem: Nutrition: Goal: Adequate nutrition will be maintained Outcome: Progressing   Problem: Coping: Goal: Level of anxiety will decrease Outcome: Progressing   Problem: Elimination: Goal: Will not experience complications related to bowel motility Outcome: Progressing Goal: Will not experience complications related to urinary retention Outcome: Progressing   Problem: Pain Managment: Goal: General experience of  comfort will improve Outcome: Progressing   Problem: Safety: Goal: Ability to remain free from injury will improve Outcome: Progressing   Problem: Skin Integrity: Goal: Risk for impaired skin integrity will decrease Outcome: Progressing

## 2019-12-27 DIAGNOSIS — R410 Disorientation, unspecified: Secondary | ICD-10-CM | POA: Diagnosis not present

## 2019-12-29 ENCOUNTER — Encounter: Payer: Medicare Other | Admitting: Gastroenterology

## 2019-12-30 ENCOUNTER — Other Ambulatory Visit: Payer: Self-pay

## 2020-01-01 ENCOUNTER — Other Ambulatory Visit: Payer: Self-pay | Admitting: Family Medicine

## 2020-01-01 ENCOUNTER — Telehealth: Payer: Self-pay | Admitting: Cardiology

## 2020-01-01 NOTE — Telephone Encounter (Signed)
New message:    Patient wife calling to ask who dose watchmen implant. Please call.

## 2020-01-01 NOTE — Telephone Encounter (Signed)
Returned call to wife (ok per DPR)-she states patient had another stroke Sept 9th and Dr. Leonie Man spoke to them about being evaluated for a watchman device.  He is in a facility for PT and will be coming home on Monday.     She states he does not have a follow up appointment or heard anything in regards to the plan.  Per chart review: Dr. [name] consulted in hospital on 9/13  1.  Atrial fibrillation and intracranial hemorrhage Has-bled score of 4 CHA2DS2-VASc of 6. The patient has a long history of atrial fibrillation recently intolerant to oral anticoagulation with multiple episodes of intracranial hemorrhage on different novel anticoagulants.  Given the patient's recurrent episodes of ischemic and hemorrhagic strokes, the patient's family and patient's care team including Dr. Leonie Man from neurology have inquired about his eligibility for a watchman device.  I had a long discussion with the patient at the bedside about the procedure and its associated risks and potential benefits.  The patient is interested and I do think he would be a good candidate based on his clinical history.  On my limited review of a CT chest with contrast not protocoled for evaluation of the left atrial appendage (see above), the left atrial appendage diameter appears to be very small.  Before determining his eligibility, will need to repeat a gated CT chest protocoled to evaluate the left atrium and left atrial appendage diameter.  I will have our Leslie coordinator reach out to the patient and his family to discuss scheduling this.    Advised would contact coordinator to follow up.  Wife verbalized understanding. Discussed with Stacy RN-Afib clinic-coordinator requested OV with Clint Fenton PA in Afib clinic to discuss further.     Wife provided # to call next week to schedule appt once patient is d/c from facility.

## 2020-01-05 ENCOUNTER — Telehealth: Payer: Medicare Other | Admitting: Cardiology

## 2020-01-05 DIAGNOSIS — J449 Chronic obstructive pulmonary disease, unspecified: Secondary | ICD-10-CM | POA: Diagnosis not present

## 2020-01-05 DIAGNOSIS — E1165 Type 2 diabetes mellitus with hyperglycemia: Secondary | ICD-10-CM | POA: Diagnosis not present

## 2020-01-05 DIAGNOSIS — I69398 Other sequelae of cerebral infarction: Secondary | ICD-10-CM | POA: Diagnosis not present

## 2020-01-05 DIAGNOSIS — M6281 Muscle weakness (generalized): Secondary | ICD-10-CM | POA: Diagnosis not present

## 2020-01-05 DIAGNOSIS — J309 Allergic rhinitis, unspecified: Secondary | ICD-10-CM | POA: Diagnosis not present

## 2020-01-05 DIAGNOSIS — I69351 Hemiplegia and hemiparesis following cerebral infarction affecting right dominant side: Secondary | ICD-10-CM | POA: Diagnosis not present

## 2020-01-05 DIAGNOSIS — I619 Nontraumatic intracerebral hemorrhage, unspecified: Secondary | ICD-10-CM | POA: Diagnosis not present

## 2020-01-05 DIAGNOSIS — N3000 Acute cystitis without hematuria: Secondary | ICD-10-CM | POA: Diagnosis not present

## 2020-01-05 DIAGNOSIS — C61 Malignant neoplasm of prostate: Secondary | ICD-10-CM | POA: Diagnosis not present

## 2020-01-05 DIAGNOSIS — J84115 Respiratory bronchiolitis interstitial lung disease: Secondary | ICD-10-CM | POA: Diagnosis not present

## 2020-01-05 DIAGNOSIS — I719 Aortic aneurysm of unspecified site, without rupture: Secondary | ICD-10-CM | POA: Diagnosis not present

## 2020-01-05 DIAGNOSIS — H547 Unspecified visual loss: Secondary | ICD-10-CM | POA: Diagnosis not present

## 2020-01-05 DIAGNOSIS — E669 Obesity, unspecified: Secondary | ICD-10-CM | POA: Diagnosis not present

## 2020-01-05 DIAGNOSIS — G4089 Other seizures: Secondary | ICD-10-CM | POA: Diagnosis not present

## 2020-01-05 DIAGNOSIS — Z6830 Body mass index (BMI) 30.0-30.9, adult: Secondary | ICD-10-CM | POA: Diagnosis not present

## 2020-01-05 DIAGNOSIS — N183 Chronic kidney disease, stage 3 unspecified: Secondary | ICD-10-CM | POA: Diagnosis not present

## 2020-01-05 DIAGNOSIS — G4733 Obstructive sleep apnea (adult) (pediatric): Secondary | ICD-10-CM | POA: Diagnosis not present

## 2020-01-05 DIAGNOSIS — I69198 Other sequelae of nontraumatic intracerebral hemorrhage: Secondary | ICD-10-CM | POA: Diagnosis not present

## 2020-01-05 DIAGNOSIS — I4811 Longstanding persistent atrial fibrillation: Secondary | ICD-10-CM | POA: Diagnosis not present

## 2020-01-05 DIAGNOSIS — Z720 Tobacco use: Secondary | ICD-10-CM | POA: Diagnosis not present

## 2020-01-05 DIAGNOSIS — I429 Cardiomyopathy, unspecified: Secondary | ICD-10-CM | POA: Diagnosis not present

## 2020-01-05 DIAGNOSIS — E785 Hyperlipidemia, unspecified: Secondary | ICD-10-CM | POA: Diagnosis not present

## 2020-01-05 DIAGNOSIS — I130 Hypertensive heart and chronic kidney disease with heart failure and stage 1 through stage 4 chronic kidney disease, or unspecified chronic kidney disease: Secondary | ICD-10-CM | POA: Diagnosis not present

## 2020-01-05 DIAGNOSIS — E1122 Type 2 diabetes mellitus with diabetic chronic kidney disease: Secondary | ICD-10-CM | POA: Diagnosis not present

## 2020-01-05 DIAGNOSIS — I5043 Acute on chronic combined systolic (congestive) and diastolic (congestive) heart failure: Secondary | ICD-10-CM | POA: Diagnosis not present

## 2020-01-05 DIAGNOSIS — M1030 Gout due to renal impairment, unspecified site: Secondary | ICD-10-CM | POA: Diagnosis not present

## 2020-01-05 NOTE — Progress Notes (Signed)
Primary Care Physician: [name], MD Primary Cardiologist: [name] Primary Electrophysiologist: [name] Referring Physician: Dr [name] is a 74 y.o. male with a history of hemorrhagic stroke (February 2020 while on Eliquis), PE, HTN, DM, HLD, atrial fibrillation paroxysmal/ persistent atrial fibrillation who presents for follow up in the [facility]. He was seen remotely in 2016 by [name]. He was admitted 9/9 after he had a visual change and headache. He sought care and there was a 21cc R occipital intraparenchymal hemorrhage. Patient was seen by [name] and [name] who recommended Watchman. Patient is in [place] today on amiodarone.   Today, he denies symptoms of palpitations, chest pain, shortness of breath, orthopnea, PND, lower extremity edema, dizziness, presyncope, syncope, snoring, daytime somnolence, bleeding. The patient is tolerating medications without difficulties and is otherwise without complaint today.    Atrial Fibrillation Risk Factors:  he does have symptoms or diagnosis of sleep apnea. he does not have a history of rheumatic fever.   he has a BMI of Body mass index is 31.02 kg/m.Marland Kitchen Filed Weights   01/06/20 1121  Weight: 100.9 kg    Family History  Problem Relation Age of Onset   Breast cancer Mother    Stroke Mother    Stroke Other        Unknown    Ovarian cancer Sister    Colon cancer Neg Hx    Esophageal cancer Neg Hx    Rectal cancer Neg Hx    Stomach cancer Neg Hx      Atrial Fibrillation Management history:  Previous antiarrhythmic drugs: amiodarone Previous cardioversions: 2016 x 2 Previous ablations: none CHADS2VASC score: 6 Anticoagulation history: Eliquis   Past Medical History:  Diagnosis Date   Allergic rhinitis    Anticoagulant long-term use    eliquis   Atrial fibrillation ([place])    Cardiomyopathy due to systemic disease ([place])    followed by dr  harding   COPD with emphysema [facility])    pulmologist-  dr Halford Chessman   Dyspnea    occasional per pt   Heart disease    History of colon polyps    tubular adenoma 2013   History of gout    09-05-2017 last flare-up  05/ 2019 3 wks ago, feet   History of sepsis 02/18/2017   per d/c note probable uti, acute chf, acute renal failure, hypoxia   History of YAG laser capsulotomy of lens December 2020, January 2021   Hyperlipidemia    Hyperplasia of prostate with lower urinary tract symptoms (LUTS)    Hypertension    OSA on CPAP    per study 08-03-2004  Severe OSA   Persistent atrial fibrillation [facility])    cardiologist --  dr Dorris Carnes--  post cardioversion 05-07-2014   Prostate cancer [facility]) urologsit-  dr Alyson Ingles--- as of 05-21-2017 per pt last PSA 11 approx.   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 ([place])    pulmologist-  dr Halford Chessman   Seasonal allergies    Sigmoid diverticulosis    Systolic and diastolic CHF, chronic [facility])    cardiologist-  [name]   Tinea versicolor    Type 2 diabetes mellitus ([place])    Wears hearing aid in both ears  Surgical History:  °Procedure Laterality Date  °• CARDIOVERSION N/A 05/07/2014  ° Procedure: CARDIOVERSION;  Surgeon: Paula Ross V, MD;  Location: [facility];  Service: Cardiovascular;  Laterality: N/A;  °• CARDIOVERSION N/A 09/09/2014  ° Procedure: CARDIOVERSION;  Surgeon: Brian S Crenshaw, MD;  Location: [facility];  Service: Cardiovascular;  Laterality: N/A;  successfully  °• CATARACT EXTRACTION W/ INTRAOCULAR LENS  IMPLANT, BILATERAL  08 and 09/  2018  °• COLONOSCOPY  last one 06-07-2011  °• GOLD SEED IMPLANT N/A 09/09/2017  ° Procedure: GOLD SEED IMPLANT;  Surgeon: McKenzie, Patrick L, MD;  Location: [facility];  Service: Urology;  Laterality: N/A;    °• HYDROCELE EXCISION Bilateral 09/26/2015  ° Procedure: HYDROCELECTOMY ADULT;  Surgeon: Patrick L McKenzie, MD;  Location: [facility];  Service: Urology;  Laterality: Bilateral;  °• LAMINECTOMY AND MICRODISCECTOMY LUMBAR SPINE  12-23-2003  ° Left L5 -- S1  °• LAPAROSCOPIC BILATERAL INGUINAL HERNIA REPAIR/  UMBILICAL HERNIA REPAIR WITH MESH/  ASPIRATION LEFT HYDROCELE  07-11-2012  °• NM MYOVIEW LTD  05/18/2014  ° Low risk study. Normal perfusion: No ischemia or infarction. Mild LV dysfunction - 46% (does not correlate with echocardiographic EF of 50-55%)  °• PROSTATE BIOPSY  05/15/12  ° Clinically both [place]  °• SPACE OAR INSTILLATION N/A 09/09/2017  ° Procedure: SPACE OAR INSTILLATION;  Surgeon: McKenzie, Patrick L, MD;  Location: [facility];  Service: Urology;  Laterality: N/A;  °• TEE WITHOUT CARDIOVERSION N/A 05/07/2014  ° Procedure: TRANSESOPHAGEAL ECHOCARDIOGRAM (TEE);  Surgeon: Paula Ross V, MD;  Location: [facility];  Service: Cardiovascular;  Laterality: N/A;   mild atherosclerosis plaque of aorta,  mild AR, MR, and TR,  no cardiac source of emboli  °• TONSILLECTOMY  as child  °• TRANSTHORACIC ECHOCARDIOGRAM  11/'18; 1/'19  ° a) In setting of sepsis: EF of 40-45%.  Diffuse hypokinesis.  No RWMA.  Biatrial enlargement.;; b) ** f/u Jan 2019: Normal LVF 55-60%** , mildly dilated aortic root(38 mm) and ascending aorta (44 mm). Compared to prior echo, LVEF has improved.  °• TRANSTHORACIC ECHOCARDIOGRAM  7/'20; 9/'20  ° a) Hemorrhagic CVA/SAH - EF> 65%.  Moderate concentric LVH.-GR 1 DD mild RA dilation.; b) EF 60 to 65%.  Mild LVH.  GRII DD.  Normal RV size and function.  Mild LA dilation.  Normal RA size.  Unable to assess PA pressures.  No significant aortic sclerosis.  °• TRANSURETHRAL RESECTION OF PROSTATE N/A 05/30/2017  ° Procedure: TRANSURETHRAL RESECTION OF THE PROSTATE (TURP);  Surgeon: McKenzie, Patrick L, MD;  Location: [facility];  Service: Urology;  Laterality: N/A;   ° ° °Current Outpatient Medications  °Medication Sig Dispense Refill  °• albuterol (VENTOLIN HFA) 108 (90 Base) MCG/ACT inhaler Inhale 2 puffs into the lungs every 6 (six) hours as needed for wheezing or shortness of breath. 1 Inhaler 5  °• amiodarone (PACERONE) 200 MG tablet Take 0.5 tablets (100 mg total) by mouth daily. 90 tablet 2  °• ascorbic acid (VITAMIN C) 500 MG tablet Take 500 mg by mouth daily.    °• aspirin EC 81 MG EC tablet Take 1 tablet (81 mg total) by mouth daily. Swallow whole. 30 tablet 11  °• atorvastatin (LIPITOR) 10 MG tablet TAKE 1 TABLET BY MOUTH DAILY EACH EVENING 90 tablet 3  °• azelastine (ASTELIN) 0.1 % nasal spray Place 1 spray into both nostrils 2 (two) times daily as needed for rhinitis or allergies. 30 mL 12  °• Blood Glucose Monitoring   Monitoring Suppl (ACCU-CHEK AVIVA PLUS) w/Device KIT Use blood glucose machine as instructed on your strips 1 kit 1   budesonide-formoterol (SYMBICORT) 160-4.5 MCG/ACT inhaler Inhale 2 puffs into the lungs 2 (two) times daily. 1 Inhaler 12   carvedilol (COREG) 6.25 MG tablet Take 1 tablet (6.25 mg total) by mouth 2 (two) times daily with a meal. 180 tablet 3   Cholecalciferol (VITAMIN D-3 PO) Take 1 capsule by mouth daily.     colchicine 0.6 MG tablet TAKE 1 TABLET (0.6 MG TOTAL) BY MOUTH 2 (TWO) TIMES DAILY AS NEEDED (GOUT FLARE). 180 tablet 0   divalproex (DEPAKOTE) 500 MG DR tablet TAKE 1 TABLET BY MOUTH EVERY 12 HOURS 180 tablet 1   fluticasone (FLONASE) 50 MCG/ACT nasal spray 1 spray by Each Nare route daily.     furosemide (LASIX) 40 MG tablet TAKE 1 TABLET (40 MG TOTAL) BY MOUTH 2 (TWO) TIMES DAILY WITH BREAKFAST AND LUNCH. 180 tablet 1   glimepiride (AMARYL) 2 MG tablet TAKE 1 TABLET BY MOUTH DAILY WITH BREAKFAST 90 tablet 1   glipiZIDE (GLUCOTROL XL) 5 MG 24 hr tablet Take by mouth.     glucose blood (ACCU-CHEK AVIVA PLUS) test strip Test 2 times daily dx e11.9 100 each 3   Lancets (ACCU-CHEK SOFT TOUCH) lancets Check blood sugars  once per day. DX: E11.9 100 each 2   losartan (COZAAR) 50 MG tablet Take 1 tablet (50 mg total) by mouth daily. 90 tablet 3   metFORMIN (GLUCOPHAGE-XR) 500 MG 24 hr tablet Take by mouth.     montelukast (SINGULAIR) 10 MG tablet TAKE 1 TABLET BY MOUTH EVERYDAY AT BEDTIME 90 tablet 1   MYRBETRIQ 25 MG TB24 tablet Take 25 mg by mouth daily.     nicotine (NICODERM CQ - DOSED IN MG/24 HOURS) 14 mg/24hr patch Place 1 patch (14 mg total) onto the skin daily. 28 patch 0   PRESCRIPTION MEDICATION CPAP- At bedtime     QUEtiapine (SEROQUEL) 25 MG tablet Take by mouth.     Sodium Sulfate-Mag Sulfate-KCl (SUTAB) [phone] MG TABS Take 1 kit by mouth as directed. BIN: [id number] PCN: CN GROUP: [id number] MEMBER ID: [id number];YIR AS CASH;NO PRIOR AUTHORIZATION 24 tablet 0   zinc gluconate 50 MG tablet Take 50 mg by mouth daily.     No current facility-administered medications for this encounter.    No Known Allergies  Social History   Socioeconomic History   Marital status: Married    Spouse name: Not on file   Number of children: 2   Years of education: 16   Highest education level: Bachelor's degree (e.g., BA, AB, BS)  Occupational History   Occupation: Scientist, clinical (histocompatibility and immunogenetics): [employer]  Tobacco Use   Smoking status: Current Every Day Smoker    Packs/day: 1.00    Years: 60.00    Pack years: 60.00    Types: Cigarettes   Smokeless tobacco: Never Used   Tobacco comment: half pack daily  Vaping Use   Vaping Use: Every day  Substance and Sexual Activity   Alcohol use: Not Currently   Drug use: No   Sexual activity: Not on file  Other Topics Concern   Not on file  Social History Narrative   Married 25 years   2 children but not with this wife   Left handed    Likes to read, netflix, tv   Social Determinants of Health   Financial Resource Strain: Low Risk    Difficulty of  Living Expenses: Not hard at all  °Food Insecurity: No Food Insecurity  °• Worried  About Running Out of Food in the Last Year: Never true  °• Ran Out of Food in the Last Year: Never true  °Transportation Needs: No Transportation Needs  °• Lack of Transportation (Medical): No  °• Lack of Transportation (Non-Medical): No  °Physical Activity: Inactive  °• Days of Exercise per Week: 0 days  °• Minutes of Exercise per Session: 0 min  °Stress: No Stress Concern Present  °• Feeling of Stress : Only a little  °Social Connections: Moderately Isolated  °• Frequency of Communication with Friends and Family: More than three times a week  °• Frequency of Social Gatherings with Friends and Family: Once a week  °• Attends Religious Services: Never  °• Active Member of Clubs or Organizations: No  °• Attends Club or Organization Meetings: Never  °• Marital Status: Married  °Intimate Partner Violence:   °• Fear of Current or Ex-Partner: Not on file  °• Emotionally Abused: Not on file  °• Physically Abused: Not on file  °• Sexually Abused: Not on file  ° ° ° °ROS- All systems are reviewed and negative except as per the HPI above. ° °Physical Exam: °Vitals:  ° 01/06/20 1121  °BP: 140/80  °Pulse: 62  °Weight: 100.9 kg  °Height: 5' 11" (1.803 m)  ° ° °GEN- The patient is well appearing obese male, alert and oriented x 3 today.   °Head- normocephalic, atraumatic °Eyes-  Sclera clear, conjunctiva pink °Ears- hearing intact °Oropharynx- clear °Neck- supple  °Lungs- Clear to ausculation bilaterally, normal work of breathing °Heart- Regular rate and rhythm, no murmurs, rubs or gallops  °GI- soft, NT, ND, + BS °Extremities- no clubbing, cyanosis, or edema °MS- no significant deformity or atrophy °Skin- no rash or lesion °Psych- euthymic mood, full affect °Neuro- strength and sensation are intact ° °Wt Readings from Last 3 Encounters:  °01/06/20 100.9 kg  °12/18/19 110.4 kg  °11/17/19 103.4 kg  ° ° °EKG today demonstrates SR HR 62, NST, PR 172, QRS 84, QTc 406 ° °Echo 12/18/19 demonstrated  ° 1. Left ventricular ejection  fraction, by estimation, is 60 to 65%. The  °left ventricle has normal function. The left ventricle has no regional  °wall motion abnormalities. There is severe asymmetric left ventricular  °hypertrophy. Left ventricular diastolic  ° parameters are indeterminate.  ° 2. Right ventricular systolic function is normal. The right ventricular  °size is normal.  ° 3. The mitral valve is normal in structure. No evidence of mitral valve  °regurgitation.  ° 4. The aortic valve is normal in structure. Aortic valve regurgitation is  °not visualized. No aortic stenosis is present.  ° 5. Aortic dilatation noted. There is moderate dilatation of the ascending  °aorta, measuring 40 mm.  ° °Epic records are reviewed at length today ° °CHA2DS2-VASc Score = 6  °The patient's score is based upon: °CHF History: 1 °HTN History: 1 °Age : 1 °Diabetes History: 1 °Stroke History: 2 °Vascular Disease History: 0 °Gender: 0 °   ° ° °ASSESSMENT AND PLAN: °1. Persistent Atrial Fibrillation (ICD10:  I48.19) °I have seen Sulo L Spratley is a 74 y.o. male in the office today who has been referred by Dr Sethi for a Watchman left atrial appendage closure device.  He has a history of persistent atrial fibrillation.  This patient's CHA2DS2-VASc Score and unadjusted Ischemic Stroke Rate (% per year) is equal to 9.7 % stroke rate/year from a score of 6 which necessitates   necessitates long term oral anticoagulation to prevent stroke. He has a Has-Bled score of 4. Unfortunately, He is not felt to be a long term anticoagulation candidate secondary to a recent intracranial hemorrhage on anticoagulation.  Patient felt to be a candidate for short term oral anticoagulation per [name] and [name].  Procedural risks for the Watchman implant have been reviewed with the patient including a 1% risk of stroke, 1% risk of perforation, 0.1% risk of device embolization. Patient seen in consult by [name] during his last hospitalization.   General education about the  procedure itself provided today. Patient and family would like to move forward with Watchman implant.   Prior to the procedure, patient will need gated CT scan of the chest with contrast timed for PV/LA visualization.   Continue amiodarone 100 mg daily. Check TSH today. Recent Cmet reviewed.  Continue Coreg 6.25 mg BID Continue ASA 81 mg daily  2. Secondary Hypercoagulable State (ICD10:  D68.69) The patient is at significant risk for stroke/thromboembolism based upon his CHA2DS2-VASc Score of 6.  However, the patient is not on anticoagulation due to his high bleeding risk. See discussion above.  3. Obesity Body mass index is 31.02 kg/m. Lifestyle modification was discussed at length including regular exercise and weight reduction.  4. Combined chronic systolic/diastolic CHF No signs or symptoms of fluid overload today.  5. HTN Stable, no changes today.  6. OSA The importance of adequate treatment of sleep apnea was discussed today in order to improve our ability to maintain sinus rhythm long term. Encouraged compliance with CPAP therapy.   Follow up for CT scan and to schedule procedure.    [facility] [address] [place], [place] [address] [phone] 01/06/2020 1:20 PM

## 2020-01-06 ENCOUNTER — Ambulatory Visit (HOSPITAL_COMMUNITY)
Admission: RE | Admit: 2020-01-06 | Discharge: 2020-01-06 | Disposition: A | Discharge status: Home / Self Care | Payer: Medicare Other | Source: Ambulatory Visit | Attending: Physician Assistant | Admitting: Physician Assistant

## 2020-01-06 ENCOUNTER — Other Ambulatory Visit (HOSPITAL_COMMUNITY): Payer: Self-pay | Admitting: Physician Assistant

## 2020-01-06 ENCOUNTER — Other Ambulatory Visit: Payer: Self-pay

## 2020-01-06 ENCOUNTER — Encounter (HOSPITAL_COMMUNITY): Payer: Self-pay | Admitting: Physician Assistant

## 2020-01-06 VITALS — BP 140/80 | HR 62 | Ht 71.0 in | Wt 222.4 lb

## 2020-01-06 DIAGNOSIS — I110 Hypertensive heart disease with heart failure: Secondary | ICD-10-CM | POA: Insufficient documentation

## 2020-01-06 DIAGNOSIS — G4733 Obstructive sleep apnea (adult) (pediatric): Secondary | ICD-10-CM | POA: Diagnosis not present

## 2020-01-06 DIAGNOSIS — Z79899 Other long term (current) drug therapy: Secondary | ICD-10-CM | POA: Diagnosis not present

## 2020-01-06 DIAGNOSIS — E669 Obesity, unspecified: Secondary | ICD-10-CM | POA: Insufficient documentation

## 2020-01-06 DIAGNOSIS — E119 Type 2 diabetes mellitus without complications: Secondary | ICD-10-CM | POA: Diagnosis not present

## 2020-01-06 DIAGNOSIS — Z7984 Long term (current) use of oral hypoglycemic drugs: Secondary | ICD-10-CM | POA: Diagnosis not present

## 2020-01-06 DIAGNOSIS — D6869 Other thrombophilia: Secondary | ICD-10-CM

## 2020-01-06 DIAGNOSIS — E785 Hyperlipidemia, unspecified: Secondary | ICD-10-CM | POA: Diagnosis not present

## 2020-01-06 DIAGNOSIS — R9431 Abnormal electrocardiogram [ECG] [EKG]: Secondary | ICD-10-CM | POA: Insufficient documentation

## 2020-01-06 DIAGNOSIS — I4819 Other persistent atrial fibrillation: Secondary | ICD-10-CM

## 2020-01-06 DIAGNOSIS — Z7951 Long term (current) use of inhaled steroids: Secondary | ICD-10-CM | POA: Diagnosis not present

## 2020-01-06 DIAGNOSIS — Z6831 Body mass index (BMI) 31.0-31.9, adult: Secondary | ICD-10-CM | POA: Insufficient documentation

## 2020-01-06 DIAGNOSIS — J439 Emphysema, unspecified: Secondary | ICD-10-CM | POA: Diagnosis not present

## 2020-01-06 DIAGNOSIS — F17210 Nicotine dependence, cigarettes, uncomplicated: Secondary | ICD-10-CM | POA: Insufficient documentation

## 2020-01-06 DIAGNOSIS — I5042 Chronic combined systolic (congestive) and diastolic (congestive) heart failure: Secondary | ICD-10-CM | POA: Insufficient documentation

## 2020-01-06 DIAGNOSIS — Z7982 Long term (current) use of aspirin: Secondary | ICD-10-CM | POA: Diagnosis not present

## 2020-01-06 DIAGNOSIS — Z8673 Personal history of transient ischemic attack (TIA), and cerebral infarction without residual deficits: Secondary | ICD-10-CM | POA: Insufficient documentation

## 2020-01-06 LAB — TSH: TSH: 2.879 u[IU]/mL (ref 0.350–4.500)

## 2020-01-07 DIAGNOSIS — H5340 Unspecified visual field defects: Secondary | ICD-10-CM | POA: Diagnosis not present

## 2020-01-08 DIAGNOSIS — I69398 Other sequelae of cerebral infarction: Secondary | ICD-10-CM | POA: Diagnosis not present

## 2020-01-08 DIAGNOSIS — H547 Unspecified visual loss: Secondary | ICD-10-CM | POA: Diagnosis not present

## 2020-01-08 DIAGNOSIS — I69351 Hemiplegia and hemiparesis following cerebral infarction affecting right dominant side: Secondary | ICD-10-CM | POA: Diagnosis not present

## 2020-01-08 DIAGNOSIS — G4089 Other seizures: Secondary | ICD-10-CM | POA: Diagnosis not present

## 2020-01-08 DIAGNOSIS — M6281 Muscle weakness (generalized): Secondary | ICD-10-CM | POA: Diagnosis not present

## 2020-01-08 DIAGNOSIS — I69198 Other sequelae of nontraumatic intracerebral hemorrhage: Secondary | ICD-10-CM | POA: Diagnosis not present

## 2020-01-10 ENCOUNTER — Telehealth: Payer: Self-pay | Admitting: Family Medicine

## 2020-01-10 NOTE — Telephone Encounter (Signed)
We received request to change Glimepiride to Glipizide secondary to cost.  Looks like this has already been done.  Will need to confirm if new RX has been sent to pharmacy.

## 2020-01-11 ENCOUNTER — Encounter: Payer: Self-pay | Admitting: Family Medicine

## 2020-01-11 ENCOUNTER — Ambulatory Visit (INDEPENDENT_AMBULATORY_CARE_PROVIDER_SITE_OTHER): Payer: Medicare Other | Admitting: Family Medicine

## 2020-01-11 ENCOUNTER — Other Ambulatory Visit: Payer: Self-pay

## 2020-01-11 VITALS — BP 150/90 | HR 63 | Temp 99.0°F | Ht 71.0 in | Wt 226.4 lb

## 2020-01-11 DIAGNOSIS — I10 Essential (primary) hypertension: Secondary | ICD-10-CM

## 2020-01-11 DIAGNOSIS — Z23 Encounter for immunization: Secondary | ICD-10-CM

## 2020-01-11 DIAGNOSIS — N183 Chronic kidney disease, stage 3 unspecified: Secondary | ICD-10-CM | POA: Diagnosis not present

## 2020-01-11 DIAGNOSIS — F17210 Nicotine dependence, cigarettes, uncomplicated: Secondary | ICD-10-CM | POA: Diagnosis not present

## 2020-01-11 DIAGNOSIS — I712 Thoracic aortic aneurysm, without rupture: Secondary | ICD-10-CM | POA: Diagnosis not present

## 2020-01-11 DIAGNOSIS — E1165 Type 2 diabetes mellitus with hyperglycemia: Secondary | ICD-10-CM | POA: Diagnosis not present

## 2020-01-11 DIAGNOSIS — I612 Nontraumatic intracerebral hemorrhage in hemisphere, unspecified: Secondary | ICD-10-CM

## 2020-01-11 DIAGNOSIS — I4819 Other persistent atrial fibrillation: Secondary | ICD-10-CM

## 2020-01-11 DIAGNOSIS — I7121 Aneurysm of the ascending aorta, without rupture: Secondary | ICD-10-CM

## 2020-01-11 NOTE — Patient Instructions (Signed)
Increase the Losartan to 100 mg daily and let's plan on 3 week recheck.

## 2020-01-11 NOTE — Progress Notes (Signed)
Established Patient Office Visit  Subjective:  Patient ID: Ronald [name], male    DOB: [birth year]  Age: 74 y.o. MRN: [id number]  CC:  Chief Complaint  Patient presents with   Hospitalization Follow-up    Doing okay, vision issues and had been to eye doctor, balance issues    HPI Ronald [name] presents for hospital follow-up following intracerebral hemorrhage while on Eliquis.  Patient was admitted on 12/17/2019 and discharged 12/23/2019.  He was admitted with right occipital intracerebral hemorrhage with subarachnoid hemorrhage while on Eliquis felt to be most likely recurrent hemorrhagic embolic stroke from A. fib despite anticoagulation.  Prior to most recent admission he was driving home and felt some left-sided visual changes along with severe headache.  He had some worsening of left leg weakness with some prior residual weakness from prior stroke.  The headache improved but his vision changes persisted and EMS was called.  He had elevated blood pressure when he was checked out.  Head CT in the ER showed acute intraparenchymal hemorrhage.  CT angiogram showed no large vessel occlusion, aneurysm, or evidence for vasculitis.  MRI showed no significant interval change in acute intraparenchymal right occipital hemorrhage.  Echocardiogram ejection fraction 60 to 65%.  LDL cholesterol 76 and A1c 6.3%.  Eliquis was discontinued and patient started on aspirin 81 mg daily.  Consultation for watchman device.  He has scheduled TEE in preparation for that.  He did go to rehab for couple weeks following hospitalization  Patient had right frontal intracerebral hemorrhage in setting of Eliquis July 2020 he was then admitted in August 2020 for seizure.  There was question of confusion with Keppra.  Patient has been on Depakote since then.  He was admitted 9/20 for pulmonary embolus.  His heparin was switched to Eliquis.  His home blood pressure medications were resumed at discharge.  Blood pressure  has been up somewhat since discharge.  Rated today 150/90 with similar repeat after several minutes of rest.  Lipitor started back at discharge along with his Metformin.  Currently using nicotine patch for smoking cessation.    Past Medical History:  Diagnosis Date   Allergic rhinitis    Anticoagulant long-term use    eliquis   Atrial fibrillation ([place])    Cardiomyopathy due to systemic disease ([place])    followed by dr harding   COPD with emphysema [facility])    pulmologist-  dr Halford Chessman   Dyspnea    occasional per pt   Heart disease    History of colon polyps    tubular adenoma 2013   History of gout    09-05-2017 last flare-up  05/ 2019 3 wks ago, feet   History of sepsis 02/18/2017   per d/c note probable uti, acute chf, acute renal failure, hypoxia   History of YAG laser capsulotomy of lens December 2020, January 2021   Hyperlipidemia    Hyperplasia of prostate with lower urinary tract symptoms (LUTS)    Hypertension    OSA on CPAP    per study 08-03-2004  Severe OSA   Persistent atrial fibrillation [facility])    cardiologist --  dr Dorris Carnes--  post cardioversion 05-07-2014   Prostate cancer [facility]) urologsit-  dr Alyson Ingles--- as of 05-21-2017 per pt last PSA 11 approx.   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 ([place])    pulmologist-  dr Halford Chessman   Seasonal allergies    Sigmoid diverticulosis    Systolic and diastolic CHF, chronic [facility])    cardiologist-  [name]   Tinea versicolor    Type 2 diabetes mellitus ([place])    Wears hearing aid in both ears     Past Surgical History:  Procedure Laterality Date   CARDIOVERSION N/A 05/07/2014   Procedure: CARDIOVERSION;  Surgeon: [name], MD;  Location: [place];  Service: Cardiovascular;  Laterality: N/A;   CARDIOVERSION N/A 09/09/2014    Procedure: CARDIOVERSION;  Surgeon: Lelon Perla, MD;  Location: [facility] ENDOSCOPY;  Service: Cardiovascular;  Laterality: N/A;  successfully   CATARACT EXTRACTION W/ INTRAOCULAR LENS  IMPLANT, BILATERAL  08 and 09/  2018   COLONOSCOPY  last one 06-07-2011   GOLD SEED IMPLANT N/A 09/09/2017   Procedure: GOLD SEED IMPLANT;  Surgeon: [name], MD;  Location: [facility];  Service: Urology;  Laterality: N/A;   HYDROCELE EXCISION Bilateral 09/26/2015   Procedure: HYDROCELECTOMY ADULT;  Surgeon: [name], MD;  Location: [facility];  Service: Urology;  Laterality: Bilateral;   LAMINECTOMY AND MICRODISCECTOMY LUMBAR SPINE  12-23-2003   Left L5 -- S1   LAPAROSCOPIC BILATERAL INGUINAL HERNIA REPAIR/  UMBILICAL HERNIA REPAIR WITH MESH/  ASPIRATION LEFT HYDROCELE  07-11-2012   NM MYOVIEW LTD  05/18/2014   Low risk study. Normal perfusion: No ischemia or infarction. Mild LV dysfunction - 46% (does not correlate with echocardiographic EF of 50-55%)   PROSTATE BIOPSY  05/15/12   Clinically both [place]   SPACE OAR INSTILLATION N/A 09/09/2017   Procedure: SPACE OAR INSTILLATION;  Surgeon: [name], MD;  Location: [facility];  Service: Urology;  Laterality: N/A;   TEE WITHOUT CARDIOVERSION N/A 05/07/2014   Procedure: TRANSESOPHAGEAL ECHOCARDIOGRAM (TEE);  Surgeon: [name], MD;  Location: [facility] ENDOSCOPY;  Service: Cardiovascular;  Laterality: N/A;   mild atherosclerosis plaque of aorta,  mild AR, MR, and TR,  no cardiac source of emboli   TONSILLECTOMY  as child   TRANSTHORACIC ECHOCARDIOGRAM  11/'18; 1/'19   a) In setting of sepsis: EF of 40-45%.  Diffuse hypokinesis.  No RWMA.  Biatrial enlargement.;; b) ** f/u Jan 2019: Normal LVF 55-60%** , mildly dilated aortic root(38 mm) and ascending aorta (44 mm). Compared to prior echo, LVEF has improved.   TRANSTHORACIC ECHOCARDIOGRAM  7/'20; 9/'20   a) Hemorrhagic CVA/SAH - EF> 65%.   Moderate concentric LVH.-GR 1 DD mild RA dilation.; b) EF 60 to 65%.  Mild LVH.  GRII DD.  Normal RV size and function.  Mild LA dilation.  Normal RA size.  Unable to assess PA pressures.  No significant aortic sclerosis.   TRANSURETHRAL RESECTION OF PROSTATE N/A 05/30/2017   Procedure: TRANSURETHRAL RESECTION OF THE PROSTATE (TURP);  Surgeon: [name], MD;  Location: [facility];  Service: Urology;  Laterality: N/A;    Family History  Problem Relation Age of Onset   Breast cancer Mother    Stroke Mother    Stroke Other        Unknown    Ovarian cancer Sister    Colon cancer Neg Hx    Esophageal cancer Neg Hx    Rectal cancer Neg Hx    Stomach cancer Neg Hx     Social History   Socioeconomic History   Marital status: Married    Spouse name: Not on  file   Number of children: 2   Years of education: 16   Highest education level: Bachelor's degree (e.g., BA, AB, BS)  Occupational History   Occupation: Scientist, clinical (histocompatibility and immunogenetics): [employer]  Tobacco Use   Smoking status: Current Every Day Smoker    Packs/day: 1.00    Years: 60.00    Pack years: 60.00    Types: Cigarettes   Smokeless tobacco: Never Used   Tobacco comment: half pack daily  Vaping Use   Vaping Use: Every day  Substance and Sexual Activity   Alcohol use: Not Currently   Drug use: No   Sexual activity: Not on file  Other Topics Concern   Not on file  Social History Narrative   Married 25 years   2 children but not with this wife   Left handed    Likes to read, netflix, tv   Social Determinants of Health   Financial Resource Strain: Low Risk    Difficulty of Paying Living Expenses: Not hard at all  Food Insecurity: No Food Insecurity   Worried About Charity fundraiser in the Last Year: Never true   Arboriculturist in the Last Year: Never true  Transportation Needs: No Transportation Needs   Lack of Transportation (Medical): No   Lack of Transportation (Non-Medical): No    Physical Activity: Inactive   Days of Exercise per Week: 0 days   Minutes of Exercise per Session: 0 min  Stress: No Stress Concern Present   Feeling of Stress : Only a little  Social Connections: Moderately Isolated   Frequency of Communication with Friends and Family: More than three times a week   Frequency of Social Gatherings with Friends and Family: Once a week   Attends Religious Services: Never   Marine scientist or Organizations: No   Attends Music therapist: Never   Marital Status: Married  Human resources officer Violence:    Fear of Current or Ex-Partner: Not on file   Emotionally Abused: Not on file   Physically Abused: Not on file   Sexually Abused: Not on file    Outpatient Medications Prior to Visit  Medication Sig Dispense Refill   albuterol (VENTOLIN HFA) 108 (90 Base) MCG/ACT inhaler Inhale 2 puffs into the lungs every 6 (six) hours as needed for wheezing or shortness of breath. 1 Inhaler 5   amiodarone (PACERONE) 200 MG tablet Take 0.5 tablets (100 mg total) by mouth daily. 90 tablet 2   ascorbic acid (VITAMIN C) 500 MG tablet Take 500 mg by mouth daily.     aspirin EC 81 MG EC tablet Take 1 tablet (81 mg total) by mouth daily. Swallow whole. 30 tablet 11   atorvastatin (LIPITOR) 10 MG tablet TAKE 1 TABLET BY MOUTH DAILY EACH EVENING 90 tablet 3   azelastine (ASTELIN) 0.1 % nasal spray Place 1 spray into both nostrils 2 (two) times daily as needed for rhinitis or allergies. 30 mL 12   Blood Glucose Monitoring Suppl (ACCU-CHEK AVIVA PLUS) w/Device KIT Use blood glucose machine as instructed on your strips 1 kit 1   budesonide-formoterol (SYMBICORT) 160-4.5 MCG/ACT inhaler Inhale 2 puffs into the lungs 2 (two) times daily. 1 Inhaler 12   carvedilol (COREG) 6.25 MG tablet Take 1 tablet (6.25 mg total) by mouth 2 (two) times daily with a meal. 180 tablet 3   Cholecalciferol (VITAMIN D-3 PO) Take 1 capsule by mouth daily.      colchicine 0.6  MG tablet TAKE 1 TABLET (0.6 MG TOTAL) BY MOUTH 2 (TWO) TIMES DAILY AS NEEDED (GOUT FLARE). 180 tablet 0   divalproex (DEPAKOTE) 500 MG DR tablet TAKE 1 TABLET BY MOUTH EVERY 12 HOURS 180 tablet 1   fluticasone (FLONASE) 50 MCG/ACT nasal spray 1 spray by Each Nare route daily.     furosemide (LASIX) 40 MG tablet TAKE 1 TABLET (40 MG TOTAL) BY MOUTH 2 (TWO) TIMES DAILY WITH BREAKFAST AND LUNCH. (Patient taking differently: Take 20 mg by mouth daily. ) 180 tablet 1   glipiZIDE (GLUCOTROL XL) 5 MG 24 hr tablet Take by mouth.     glucose blood (ACCU-CHEK AVIVA PLUS) test strip Test 2 times daily dx e11.9 100 each 3   Lancets (ACCU-CHEK SOFT TOUCH) lancets Check blood sugars once per day. DX: E11.9 100 each 2   losartan (COZAAR) 50 MG tablet Take 1 tablet (50 mg total) by mouth daily. 90 tablet 3   magnesium oxide (MAG-OX) 400 (241.3 Mg) MG tablet Take 1 tablet by mouth 3 (three) times daily.     metFORMIN (GLUCOPHAGE-XR) 500 MG 24 hr tablet Take by mouth.     montelukast (SINGULAIR) 10 MG tablet TAKE 1 TABLET BY MOUTH EVERYDAY AT BEDTIME 90 tablet 1   MYRBETRIQ 25 MG TB24 tablet Take 25 mg by mouth daily.     nicotine (NICODERM CQ - DOSED IN MG/24 HR) 7 mg/24hr patch 7 mg daily.     PRESCRIPTION MEDICATION CPAP- At bedtime     QUEtiapine (SEROQUEL) 25 MG tablet Take by mouth.     zinc gluconate 50 MG tablet Take 50 mg by mouth daily.     nicotine (NICODERM CQ - DOSED IN MG/24 HOURS) 14 mg/24hr patch Place 1 patch (14 mg total) onto the skin daily. (Patient not taking: Reported on 01/11/2020) 28 patch 0   Sodium Sulfate-Mag Sulfate-KCl (SUTAB) [phone] MG TABS Take 1 kit by mouth as directed. BIN: [id number] PCN: CN GROUP: [id number] MEMBER ID: [id number];DGU AS CASH;NO PRIOR AUTHORIZATION (Patient not taking: Reported on 01/11/2020) 24 tablet 0   No facility-administered medications prior to visit.    No Known Allergies  ROS Review of Systems  Constitutional:  Negative for chills and fever.  Eyes: Positive for visual disturbance.  Respiratory: Negative for cough and shortness of breath.   Cardiovascular: Negative for chest pain and leg swelling.  Gastrointestinal: Negative for abdominal pain.  Genitourinary: Negative for dysuria.  Neurological: Negative for headaches.  Psychiatric/Behavioral: Negative for confusion.      Objective:    Physical Exam Vitals reviewed.  Constitutional:      Appearance: Normal appearance.  Cardiovascular:     Rate and Rhythm: Normal rate and regular rhythm.  Pulmonary:     Effort: Pulmonary effort is normal.     Breath sounds: Normal breath sounds.  Musculoskeletal:     Right lower leg: No edema.     Left lower leg: No edema.  Neurological:     Mental Status: He is alert and oriented to person, place, and time.     Cranial Nerves: No cranial nerve deficit.     BP (!) 150/90    Pulse 63    Temp 99 F (37.2 C) (Oral)    Ht $R'5\' 11"'XM$  (1.803 m)    Wt 226 lb 6.4 oz (102.7 kg)    SpO2 97%    BMI 31.58 kg/m  Wt Readings from Last 3 Encounters:  01/11/20 226 lb 6.4 oz (102.7 kg)  01/06/20  222 lb 6.4 oz (100.9 kg)  12/18/19 243 lb 6.2 oz (110.4 kg)     Health Maintenance Due  Topic Date Due   Hepatitis C Screening  Never done   COVID-19 Vaccine (1) Never done   FOOT EXAM  12/05/2017   COLONOSCOPY  09/06/2019    There are no preventive care reminders to display for this patient.  Lab Results  Component Value Date   TSH 2.879 01/06/2020   Lab Results  Component Value Date   WBC 6.0 12/22/2019   HGB 16.6 12/22/2019   HCT 49.6 12/22/2019   MCV 93.9 12/22/2019   PLT 190 12/22/2019   Lab Results  Component Value Date   NA 132 (L) 12/22/2019   K 4.0 12/22/2019   CO2 23 12/22/2019   GLUCOSE 124 (H) 12/22/2019   BUN 22 12/22/2019   CREATININE 1.26 (H) 12/22/2019   BILITOT 1.2 12/22/2019   ALKPHOS 52 12/22/2019   AST 16 12/22/2019   ALT 9 12/22/2019   PROT 5.5 (L) 12/22/2019   ALBUMIN  3.3 (L) 12/22/2019   CALCIUM 8.7 (L) 12/22/2019   ANIONGAP 11 12/22/2019   GFR 54.42 (L) 08/04/2019   Lab Results  Component Value Date   CHOL 125 12/18/2019   Lab Results  Component Value Date   HDL 30 (L) 12/18/2019   Lab Results  Component Value Date   LDLCALC 76 12/18/2019   Lab Results  Component Value Date   TRIG 97 12/18/2019   Lab Results  Component Value Date   CHOLHDL 4.2 12/18/2019   Lab Results  Component Value Date   HGBA1C 6.3 (H) 12/18/2019      Assessment & Plan:   #1 recent right occipital intracerebral hemorrhage with subarachnoid hemorrhage and patient on Eliquis -Eliquis held at this time and patient currently on baby aspirin -Being evaluated for watchman procedure  #2 hypertension-poorly controlled currently -Increase losartan to 100 mg daily.  Recommend follow-up in the next 3 to 4 weeks to reassess  #3 hyperlipidemia.  Goal LDL less than 70.  Patient back on Lipitor -Check fasting lipids at follow-up  #4 type 2 diabetes fairly well controlled with recent A1c 6.3% -Continue current regimen recheck A1c at follow-up  #5 history of ongoing nicotine use.  Patient currently using nicotine patches -Strongly advised to quit smoking altogether  #6 history of seizures secondary to intracerebral hemorrhage currently stable on Depakote.  Recent Depakote level stable  #7 history of chronic kidney disease stable by discharge labs  No orders of the defined types were placed in this encounter.   Follow-up: Return in about 4 weeks (around 02/08/2020).    Carolann Littler, MD

## 2020-01-12 DIAGNOSIS — G4089 Other seizures: Secondary | ICD-10-CM | POA: Diagnosis not present

## 2020-01-12 DIAGNOSIS — I69398 Other sequelae of cerebral infarction: Secondary | ICD-10-CM | POA: Diagnosis not present

## 2020-01-12 DIAGNOSIS — I69351 Hemiplegia and hemiparesis following cerebral infarction affecting right dominant side: Secondary | ICD-10-CM | POA: Diagnosis not present

## 2020-01-12 DIAGNOSIS — M6281 Muscle weakness (generalized): Secondary | ICD-10-CM | POA: Diagnosis not present

## 2020-01-12 DIAGNOSIS — H547 Unspecified visual loss: Secondary | ICD-10-CM | POA: Diagnosis not present

## 2020-01-12 DIAGNOSIS — I69198 Other sequelae of nontraumatic intracerebral hemorrhage: Secondary | ICD-10-CM | POA: Diagnosis not present

## 2020-01-13 ENCOUNTER — Ambulatory Visit (HOSPITAL_COMMUNITY): Payer: Medicare Other | Admitting: Physician Assistant

## 2020-01-14 ENCOUNTER — Telehealth: Payer: Self-pay | Admitting: Nurse Practitioner

## 2020-01-14 DIAGNOSIS — I69198 Other sequelae of nontraumatic intracerebral hemorrhage: Secondary | ICD-10-CM | POA: Diagnosis not present

## 2020-01-14 DIAGNOSIS — M6281 Muscle weakness (generalized): Secondary | ICD-10-CM | POA: Diagnosis not present

## 2020-01-14 DIAGNOSIS — G4089 Other seizures: Secondary | ICD-10-CM | POA: Diagnosis not present

## 2020-01-14 DIAGNOSIS — I69351 Hemiplegia and hemiparesis following cerebral infarction affecting right dominant side: Secondary | ICD-10-CM | POA: Diagnosis not present

## 2020-01-14 DIAGNOSIS — I69398 Other sequelae of cerebral infarction: Secondary | ICD-10-CM | POA: Diagnosis not present

## 2020-01-14 DIAGNOSIS — H547 Unspecified visual loss: Secondary | ICD-10-CM | POA: Diagnosis not present

## 2020-01-14 NOTE — Telephone Encounter (Signed)
Spoke with patients wife and introduced myself as primary point of contact for the WATCHMAN procedure. We also discussed the patients upcoming CT scan on 10/11 and next steps for the procedure that will take place on November 4th.

## 2020-01-15 ENCOUNTER — Telehealth (HOSPITAL_COMMUNITY): Payer: Self-pay | Admitting: Emergency Medicine

## 2020-01-15 ENCOUNTER — Ambulatory Visit: Payer: Medicare Other | Admitting: Family Medicine

## 2020-01-15 NOTE — Telephone Encounter (Signed)
Reaching out to patient to offer assistance regarding upcoming cardiac imaging study; pt verbalizes understanding of appt date/time, parking situation and where to check in, pre-test NPO status and medications ordered, and verified current allergies; name and call back number provided for further questions should they arise Marchia Bond RN [place] [facility] [phone] office [phone] cell   Spoke with wife who will be brining patient to appt. States hx of stroke and cannot remember well.  Recited instruction back to me with teach back and denies further questions. Clarise Cruz

## 2020-01-18 ENCOUNTER — Encounter (HOSPITAL_COMMUNITY): Payer: Self-pay

## 2020-01-18 ENCOUNTER — Telehealth: Payer: Self-pay | Admitting: Cardiology

## 2020-01-18 ENCOUNTER — Ambulatory Visit (HOSPITAL_COMMUNITY)
Admission: RE | Admit: 2020-01-18 | Discharge: 2020-01-18 | Disposition: A | Discharge status: Home / Self Care | Payer: Medicare Other | Source: Ambulatory Visit | Attending: Physician Assistant | Admitting: Physician Assistant

## 2020-01-18 ENCOUNTER — Other Ambulatory Visit: Payer: Self-pay

## 2020-01-18 DIAGNOSIS — I4819 Other persistent atrial fibrillation: Secondary | ICD-10-CM | POA: Diagnosis present

## 2020-01-18 MED ORDER — DILTIAZEM HCL 25 MG/5ML IV SOLN
INTRAVENOUS | Status: AC
  Filled 2020-01-18: qty 5

## 2020-01-18 MED ORDER — DILTIAZEM HCL 25 MG/5ML IV SOLN
5.0000 mg | Freq: Once | INTRAVENOUS | Status: AC
  Administered 2020-01-18: 5 mg via INTRAVENOUS
  Filled 2020-01-18: qty 5

## 2020-01-18 MED ORDER — METOPROLOL TARTRATE 5 MG/5ML IV SOLN
INTRAVENOUS | Status: AC
  Filled 2020-01-18: qty 15

## 2020-01-18 NOTE — Telephone Encounter (Signed)
Spoke with pts wife and Cardiac CT was cancelled  due to pt being in afib rate was 120-130 Per wife cardiologist instructed ct staff to have pt increase Amiodarone to 200 mg bid from 100 mg qd Also pt's wife wanted reminder the reason for doing CT pt is scheduled to have watchman implanted on Nov 4 Will forward to [name] for review and recommendations.Pt also has an appt with Roby Lofts PA  on 01/21/20./cy

## 2020-01-18 NOTE — Telephone Encounter (Signed)
   Katharine Look is calling, she said pt didn't get the CT today because he was in Afib. She wanted to speak with [name] or his nurse to know what they need to do.

## 2020-01-20 DIAGNOSIS — H547 Unspecified visual loss: Secondary | ICD-10-CM | POA: Diagnosis not present

## 2020-01-20 DIAGNOSIS — I69351 Hemiplegia and hemiparesis following cerebral infarction affecting right dominant side: Secondary | ICD-10-CM | POA: Diagnosis not present

## 2020-01-20 DIAGNOSIS — I69198 Other sequelae of nontraumatic intracerebral hemorrhage: Secondary | ICD-10-CM | POA: Diagnosis not present

## 2020-01-20 DIAGNOSIS — G4089 Other seizures: Secondary | ICD-10-CM | POA: Diagnosis not present

## 2020-01-20 DIAGNOSIS — I69398 Other sequelae of cerebral infarction: Secondary | ICD-10-CM | POA: Diagnosis not present

## 2020-01-20 DIAGNOSIS — M6281 Muscle weakness (generalized): Secondary | ICD-10-CM | POA: Diagnosis not present

## 2020-01-21 ENCOUNTER — Ambulatory Visit (INDEPENDENT_AMBULATORY_CARE_PROVIDER_SITE_OTHER): Payer: Medicare Other | Admitting: Cardiology

## 2020-01-21 ENCOUNTER — Ambulatory Visit: Payer: Medicare Other | Admitting: Medical

## 2020-01-21 ENCOUNTER — Other Ambulatory Visit: Payer: Self-pay

## 2020-01-21 VITALS — BP 140/78 | HR 75 | Ht 71.0 in | Wt 225.0 lb

## 2020-01-21 DIAGNOSIS — I4819 Other persistent atrial fibrillation: Secondary | ICD-10-CM

## 2020-01-21 DIAGNOSIS — I612 Nontraumatic intracerebral hemorrhage in hemisphere, unspecified: Secondary | ICD-10-CM | POA: Diagnosis not present

## 2020-01-21 DIAGNOSIS — N183 Chronic kidney disease, stage 3 unspecified: Secondary | ICD-10-CM

## 2020-01-21 MED ORDER — METOPROLOL TARTRATE 100 MG PO TABS: ORAL_TABLET | ORAL | 0 refills | Status: DC

## 2020-01-21 MED ORDER — AMIODARONE HCL 200 MG PO TABS: 200.0000 mg | ORAL_TABLET | Freq: Two times a day (BID) | ORAL | 3 refills | Status: AC

## 2020-01-21 MED ORDER — LOSARTAN POTASSIUM 100 MG PO TABS: 100.0000 mg | ORAL_TABLET | Freq: Every day | ORAL | 3 refills | Status: AC

## 2020-01-21 MED ORDER — APIXABAN 5 MG PO TABS: 5.0000 mg | ORAL_TABLET | Freq: Two times a day (BID) | ORAL | 11 refills | Status: AC

## 2020-01-21 NOTE — Patient Instructions (Addendum)
Medication Instructions:  Your physician has recommended you make the following change in your medication:  1.  START taking Eliquis 5 mg--one tablet by mouth twice a day on February 08, 2020   Labwork: You will get lab work today:   BMP and CBC  Testing/Procedures: None ordered.  Follow-Up: After your Watchman procedure.  Any Other Special Instructions Will Be Listed Below (If Applicable).  If you need a refill on your cardiac medications before your next appointment, please call your pharmacy.

## 2020-01-21 NOTE — Progress Notes (Signed)
Electrophysiology Office Follow up Visit Note:    Date:  01/21/2020   ID:  Ronald [name], DOB Apr 26, 1945, MRN [id number]  PCP:  [name], MD  [facility] Cardiologist:  Glenetta Hew, MD  [facility] Electrophysiologist:  None    Interval History:    Ronald [name] is a 74 y.o. male who presents for a follow up visit.  I last saw Ronald [name] when he was in the hospital for an intracranial hemorrhage from September 9 to December 23, 2019.  Since discharge he has done well.  He is tolerating aspirin 81 mg daily.  As part of his evaluation for watchman implant, he went for a CT scan which had to be canceled due to atrial fibrillation with rapid ventricular rates.  His amiodarone was increased at that time to 200 mg twice daily.  This is successfully converted him back to normal rhythm.  He is feeling significantly better normal rhythm.  He tells me that he thinks he must be quite symptomatic while he is in atrial fibrillation.  He is with his wife today who I am meeting for the first time.     Past Medical History:  Diagnosis Date  . Allergic rhinitis   . Anticoagulant long-term use    eliquis  . Atrial fibrillation ([place])   . Cardiomyopathy due to systemic disease [facility])    followed by dr harding  . COPD with emphysema [facility])    pulmologist-  dr Halford Chessman  . Dyspnea    occasional per pt  . Heart disease   . History of colon polyps    tubular adenoma 2013  . History of gout    09-05-2017 last flare-up  05/ 2019 3 wks ago, feet  . History of sepsis 02/18/2017   per d/c note probable uti, acute chf, acute renal failure, hypoxia  . History of YAG laser capsulotomy of lens December 2020, January 2021  . Hyperlipidemia   . Hyperplasia of prostate with lower urinary tract symptoms (LUTS)   . Hypertension   . OSA on CPAP    per study 08-03-2004  Severe OSA  . Persistent atrial fibrillation [facility])    cardiologist --  dr Dorris Carnes--  post cardioversion 05-07-2014  .  Prostate cancer [facility]) urologsit-  dr Alyson Ingles--- as of 05-21-2017 per pt last PSA 11 approx.   Dx  2014--  stage T1c, Gleason 3+3=6, PSA 6.67--  Active surveillance/  04/ 2019  Stage T1b, Gleason 3+4, PSA 12.8- plan external radiation therapy    . 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 ([place])    pulmologist-  dr Halford Chessman  . Seasonal allergies   . Sigmoid diverticulosis   . Systolic and diastolic CHF, chronic [facility])    cardiologist-  [name]  . Tinea versicolor   . Type 2 diabetes mellitus ([place])   . Wears hearing aid in both ears     Past Surgical History:  Procedure Laterality Date  . CARDIOVERSION N/A 05/07/2014   Procedure: CARDIOVERSION;  Surgeon: [name], MD;  Location: [facility] ENDOSCOPY;  Service: Cardiovascular;  Laterality: N/A;  . CARDIOVERSION N/A 09/09/2014   Procedure: CARDIOVERSION;  Surgeon: Lelon Perla, MD;  Location: [facility] ENDOSCOPY;  Service: Cardiovascular;  Laterality: N/A;  successfully  . CATARACT EXTRACTION W/ INTRAOCULAR LENS  IMPLANT, BILATERAL  08 and 09/  2018  . COLONOSCOPY  last one 06-07-2011  . GOLD SEED IMPLANT N/A 09/09/2017   Procedure: GOLD SEED IMPLANT;  Surgeon: [name],  MD;  Location: [place];  Service: Urology;  Laterality: N/A;  . HYDROCELE EXCISION Bilateral 09/26/2015   Procedure: HYDROCELECTOMY ADULT;  Surgeon: [name], MD;  Location: [facility];  Service: Urology;  Laterality: Bilateral;  . LAMINECTOMY AND MICRODISCECTOMY LUMBAR SPINE  12-23-2003   Left L5 -- S1  . LAPAROSCOPIC BILATERAL INGUINAL HERNIA REPAIR/  UMBILICAL HERNIA REPAIR WITH MESH/  ASPIRATION LEFT HYDROCELE  07-11-2012  . NM MYOVIEW LTD  05/18/2014   Low risk study. Normal perfusion: No ischemia or infarction. Mild LV dysfunction - 46% (does not correlate with echocardiographic EF of 50-55%)  . PROSTATE BIOPSY  05/15/12   Clinically both [place]  . SPACE OAR INSTILLATION N/A 09/09/2017   Procedure: SPACE OAR  INSTILLATION;  Surgeon: [name], MD;  Location: [facility];  Service: Urology;  Laterality: N/A;  . TEE WITHOUT CARDIOVERSION N/A 05/07/2014   Procedure: TRANSESOPHAGEAL ECHOCARDIOGRAM (TEE);  Surgeon: [name], MD;  Location: [facility] ENDOSCOPY;  Service: Cardiovascular;  Laterality: N/A;   mild atherosclerosis plaque of aorta,  mild AR, MR, and TR,  no cardiac source of emboli  . TONSILLECTOMY  as child  . TRANSTHORACIC ECHOCARDIOGRAM  11/'18; 1/'19   a) In setting of sepsis: EF of 40-45%.  Diffuse hypokinesis.  No RWMA.  Biatrial enlargement.;; b) ** f/u Jan 2019: Normal LVF 55-60%** , mildly dilated aortic root(38 mm) and ascending aorta (44 mm). Compared to prior echo, LVEF has improved.  . TRANSTHORACIC ECHOCARDIOGRAM  7/'20; 9/'20   a) Hemorrhagic CVA/SAH - EF> 65%.  Moderate concentric LVH.-GR 1 DD mild RA dilation.; b) EF 60 to 65%.  Mild LVH.  GRII DD.  Normal RV size and function.  Mild LA dilation.  Normal RA size.  Unable to assess PA pressures.  No significant aortic sclerosis.  . TRANSURETHRAL RESECTION OF PROSTATE N/A 05/30/2017   Procedure: TRANSURETHRAL RESECTION OF THE PROSTATE (TURP);  Surgeon: [name], MD;  Location: [facility];  Service: Urology;  Laterality: N/A;    Current Medications: Current Meds  Medication Sig  . albuterol (VENTOLIN HFA) 108 (90 Base) MCG/ACT inhaler Inhale 2 puffs into the lungs every 6 (six) hours as needed for wheezing or shortness of breath.  Marland Kitchen ascorbic acid (VITAMIN C) 500 MG tablet Take 500 mg by mouth daily.  Marland Kitchen aspirin EC 81 MG EC tablet Take 1 tablet (81 mg total) by mouth daily. Swallow whole.  Marland Kitchen atorvastatin (LIPITOR) 10 MG tablet TAKE 1 TABLET BY MOUTH DAILY EACH EVENING  . azelastine (ASTELIN) 0.1 % nasal spray Place 1 spray into both nostrils 2 (two) times daily as needed for rhinitis or allergies.  . Blood Glucose Monitoring Suppl (ACCU-CHEK AVIVA PLUS) w/Device KIT Use blood glucose machine as instructed  on your strips  . budesonide-formoterol (SYMBICORT) 160-4.5 MCG/ACT inhaler Inhale 2 puffs into the lungs 2 (two) times daily.  . carvedilol (COREG) 6.25 MG tablet Take 1 tablet (6.25 mg total) by mouth 2 (two) times daily with a meal.  . Cholecalciferol (VITAMIN D-3 PO) Take 1 capsule by mouth daily.  . colchicine 0.6 MG tablet TAKE 1 TABLET (0.6 MG TOTAL) BY MOUTH 2 (TWO) TIMES DAILY AS NEEDED (GOUT FLARE).  Marland Kitchen divalproex (DEPAKOTE) 500 MG DR tablet TAKE 1 TABLET BY MOUTH EVERY 12 HOURS  . fluticasone (FLONASE) 50 MCG/ACT nasal spray 1 spray by Each Nare route daily.  . furosemide (LASIX) 40 MG tablet TAKE 1 TABLET (40 MG TOTAL) BY MOUTH  2 (TWO) TIMES DAILY WITH BREAKFAST AND LUNCH. (Patient taking differently: Take 20 mg by mouth daily. )  . glipiZIDE (GLUCOTROL XL) 5 MG 24 hr tablet Take by mouth.  Marland Kitchen glucose blood (ACCU-CHEK AVIVA PLUS) test strip Test 2 times daily dx e11.9  . Lancets (ACCU-CHEK SOFT TOUCH) lancets Check blood sugars once per day. DX: E11.9  . magnesium oxide (MAG-OX) 400 (241.3 Mg) MG tablet Take 1 tablet by mouth 3 (three) times daily.  . metFORMIN (GLUCOPHAGE-XR) 500 MG 24 hr tablet Take by mouth.  . montelukast (SINGULAIR) 10 MG tablet TAKE 1 TABLET BY MOUTH EVERYDAY AT BEDTIME  . MYRBETRIQ 25 MG TB24 tablet Take 25 mg by mouth daily.  . nicotine (NICODERM CQ - DOSED IN MG/24 HOURS) 14 mg/24hr patch Place 1 patch (14 mg total) onto the skin daily.  . nicotine (NICODERM CQ - DOSED IN MG/24 HR) 7 mg/24hr patch 7 mg daily.  Marland Kitchen PRESCRIPTION MEDICATION CPAP- At bedtime  . QUEtiapine (SEROQUEL) 25 MG tablet Take by mouth.  . Sodium Sulfate-Mag Sulfate-KCl (SUTAB) [phone] MG TABS Take 1 kit by mouth as directed. BIN: [id number] PCN: CN GROUP: [id number] MEMBER ID: [id number];MGQ AS CASH;NO PRIOR AUTHORIZATION  . [DISCONTINUED] amiodarone (PACERONE) 200 MG tablet Take 0.5 tablets (100 mg total) by mouth daily. (Patient taking differently: Take 200 mg by mouth 2 (two) times daily.  )  . [DISCONTINUED] losartan (COZAAR) 50 MG tablet Take 1 tablet (50 mg total) by mouth daily. (Patient taking differently: Take 100 mg by mouth in the morning and at bedtime. )  . [DISCONTINUED] zinc gluconate 50 MG tablet Take 50 mg by mouth daily.     Allergies:   Patient has no known allergies.   Social History   Socioeconomic History  . Marital status: Married    Spouse name: Not on file  . Number of children: 2  . Years of education: 16  . Highest education level: Bachelor's degree (e.g., BA, AB, BS)  Occupational History  . Occupation: Scientist, clinical (histocompatibility and immunogenetics): [employer]  Tobacco Use  . Smoking status: Current Every Day Smoker    Packs/day: 1.00    Years: 60.00    Pack years: 60.00    Types: Cigarettes  . Smokeless tobacco: Never Used  . Tobacco comment: half pack daily  Vaping Use  . Vaping Use: Every day  Substance and Sexual Activity  . Alcohol use: Not Currently  . Drug use: No  . Sexual activity: Not on file  Other Topics Concern  . Not on file  Social History Narrative   Married 25 years   2 children but not with this wife   Left handed    Likes to read, netflix, tv   Social Determinants of Health   Financial Resource Strain: Low Risk   . Difficulty of Paying Living Expenses: Not hard at all  Food Insecurity: No Food Insecurity  . Worried About Charity fundraiser in the Last Year: Never true  . Ran Out of Food in the Last Year: Never true  Transportation Needs: No Transportation Needs  . Lack of Transportation (Medical): No  . Lack of Transportation (Non-Medical): No  Physical Activity: Inactive  . Days of Exercise per Week: 0 days  . Minutes of Exercise per Session: 0 min  Stress: No Stress Concern Present  . Feeling of Stress : Only a little  Social Connections: Moderately Isolated  . Frequency of Communication with Friends and Family: More than three times a  week  . Frequency of Social Gatherings with Friends and Family: Once a week  . Attends  Religious Services: Never  . Active Member of Clubs or Organizations: No  . Attends Archivist Meetings: Never  . Marital Status: Married     Family History: The patient's family history includes Breast cancer in his mother; Ovarian cancer in his sister; Stroke in his mother and another family member. There is no history of Colon cancer, Esophageal cancer, Rectal cancer, or Stomach cancer.  ROS:   Please see the history of present illness.    All other systems reviewed and are negative.  EKGs/Labs/Other Studies Reviewed:    The following studies were reviewed today: Hospital records  EKG:  The ekg ordered today demonstrates normal sinus rhythm  Recent Labs: 05/19/2019: Magnesium 1.9 12/18/2019: B Natriuretic Peptide 111.2 12/22/2019: ALT 9; BUN 22; Creatinine, Ser 1.26; Hemoglobin 16.6; Platelets 190; Potassium 4.0; Sodium 132 01/06/2020: TSH 2.879  Recent Lipid Panel    Component Value Date/Time   CHOL 125 12/18/2019 0644   TRIG 97 12/18/2019 0644   HDL 30 (L) 12/18/2019 0644   CHOLHDL 4.2 12/18/2019 0644   VLDL 19 12/18/2019 0644   LDLCALC 76 12/18/2019 0644   LDLDIRECT 69.0 04/22/2018 1210    Physical Exam:    VS:  BP 140/78   Pulse 75   Ht '5\' 11"'  (1.803 m)   Wt 225 lb (102.1 kg)   SpO2 98%   BMI 31.38 kg/m     Wt Readings from Last 3 Encounters:  01/21/20 225 lb (102.1 kg)  01/11/20 226 lb 6.4 oz (102.7 kg)  01/06/20 222 lb 6.4 oz (100.9 kg)     GEN:  Well nourished, well developed in no acute distress HEENT: Normal NECK: No JVD; No carotid bruits LYMPHATICS: No lymphadenopathy CARDIAC: RRR, no murmurs, rubs, gallops RESPIRATORY:  Clear to auscultation without rales, wheezing or rhonchi  ABDOMEN: Soft, non-tender, non-distended MUSCULOSKELETAL:  No edema; No deformity  SKIN: Warm and dry NEUROLOGIC:  Alert and oriented x 3 PSYCHIATRIC:  Normal affect   ASSESSMENT:    1. Persistent atrial fibrillation (HCC)   2. Stage 3 chronic kidney  disease, unspecified whether stage 3a or 3b CKD ([place])   3. Nontraumatic hemorrhage of right cerebral hemisphere [facility])    PLAN:    In order of problems listed above:  1. Persistent atrial fibrillation Off anticoagulation given history of right occipital intracranial hemorrhage.  Neurology believes this to have been a hemorrhagic conversion from an embolic stroke given his history of atrial fibrillation.  He is being worked up for the [place].  I think he is a ideal candidate for the device.  His neurology team thinks he is a suitable candidate for short-term anticoagulation.  We will proceed with the work-up including a CT.  We will tentatively schedule him for November 4 implant date.  We will plan on him restarting his Eliquis at home on November 1.  Anticipate Eliquis 5 mg by mouth twice daily in addition aspirin 81 mg once daily for the 45 days following implant.  Risks and benefits of the watchman implant were discussed in detail with the patient and his wife today and both still wished to proceed.   Medication Adjustments/Labs and Tests Ordered: Current medicines are reviewed at length with the patient today.  Concerns regarding medicines are outlined above.  Orders Placed This Encounter  Procedures  . Basic Metabolic Panel (BMET)  . CBC w/Diff  . EKG  12-Lead   Meds ordered this encounter  Medications  . metoprolol tartrate (LOPRESSOR) 100 MG tablet    Sig: Take one tablet by mouth 2 hours prior to cardiac CT    Dispense:  1 tablet    Refill:  0  . losartan (COZAAR) 100 MG tablet    Sig: Take 1 tablet (100 mg total) by mouth daily.    Dispense:  90 tablet    Refill:  3  . apixaban (ELIQUIS) 5 MG TABS tablet    Sig: Take 1 tablet (5 mg total) by mouth 2 (two) times daily.    Dispense:  60 tablet    Refill:  11     Signed, Lars Mage, MD, [facility]  01/21/2020 2:12 PM    Electrophysiology [facility]

## 2020-01-21 NOTE — H&P (View-Only) (Signed)
Electrophysiology Office Follow up Visit Note:    Date:  01/21/2020   ID:  Ronald [name], DOB 05-12-1945, MRN [id number]  PCP:  [name], MD  [facility] Cardiologist:  Glenetta Hew, MD  [facility] Electrophysiologist:  None    Interval History:    Ronald [name] is a 74 y.o. male who presents for a follow up visit.  I last saw Ronald [name] when he was in the hospital for an intracranial hemorrhage from September 9 to December 23, 2019.  Since discharge he has done well.  He is tolerating aspirin 81 mg daily.  As part of his evaluation for watchman implant, he went for a CT scan which had to be canceled due to atrial fibrillation with rapid ventricular rates.  His amiodarone was increased at that time to 200 mg twice daily.  This is successfully converted him back to normal rhythm.  He is feeling significantly better normal rhythm.  He tells me that he thinks he must be quite symptomatic while he is in atrial fibrillation.  He is with his wife today who I am meeting for the first time.     Past Medical History:  Diagnosis Date  . Allergic rhinitis   . Anticoagulant long-term use    eliquis  . Atrial fibrillation ([place])   . Cardiomyopathy due to systemic disease [facility])    followed by dr harding  . COPD with emphysema [facility])    pulmologist-  dr Halford Chessman  . Dyspnea    occasional per pt  . Heart disease   . History of colon polyps    tubular adenoma 2013  . History of gout    09-05-2017 last flare-up  05/ 2019 3 wks ago, feet  . History of sepsis 02/18/2017   per d/c note probable uti, acute chf, acute renal failure, hypoxia  . History of YAG laser capsulotomy of lens December 2020, January 2021  . Hyperlipidemia   . Hyperplasia of prostate with lower urinary tract symptoms (LUTS)   . Hypertension   . OSA on CPAP    per study 08-03-2004  Severe OSA  . Persistent atrial fibrillation [facility])    cardiologist --  dr Dorris Carnes--  post cardioversion 05-07-2014  .  Prostate cancer [facility]) urologsit-  dr Alyson Ingles--- as of 05-21-2017 per pt last PSA 11 approx.   Dx  2014--  stage T1c, Gleason 3+3=6, PSA 6.67--  Active surveillance/  04/ 2019  Stage T1b, Gleason 3+4, PSA 12.8- plan external radiation therapy    . 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 Seasonal allergies   . Sigmoid diverticulosis   . Systolic and diastolic CHF, chronic [facility])    cardiologist-  [name]  . Tinea versicolor   . Type 2 diabetes mellitus ([place])   . Wears hearing aid in both ears     Past Surgical History:  Procedure Laterality Date  . CARDIOVERSION N/A 05/07/2014   Procedure: CARDIOVERSION;  Surgeon: [name], MD;  Location: [facility] ENDOSCOPY;  Service: Cardiovascular;  Laterality: N/A;  . CARDIOVERSION N/A 09/09/2014   Procedure: CARDIOVERSION;  Surgeon: Lelon Perla, MD;  Location: [facility] ENDOSCOPY;  Service: Cardiovascular;  Laterality: N/A;  successfully  . CATARACT EXTRACTION W/ INTRAOCULAR LENS  IMPLANT, BILATERAL  08 and 09/  2018  . COLONOSCOPY  last one 06-07-2011  . GOLD SEED IMPLANT N/A 09/09/2017   Procedure: GOLD SEED IMPLANT;  Surgeon: [name],  MD;  Location: [place];  Service: Urology;  Laterality: N/A;  . HYDROCELE EXCISION Bilateral 09/26/2015   Procedure: HYDROCELECTOMY ADULT;  Surgeon: [name], MD;  Location[facility];  Service: Urology;  Laterality: Bilateral;  . LAMINECTOMY AND MICRODISCECTOMY LUMBAR SPINE  12-23-2003   Left L5 -- S1  . LAPAROSCOPIC BILATERAL INGUINAL HERNIA REPAIR/  UMBILICAL HERNIA REPAIR WITH MESH/  ASPIRATION LEFT HYDROCELE  07-11-2012  . NM MYOVIEW LTD  05/18/2014   Low risk study. Normal perfusion: No ischemia or infarction. Mild LV dysfunction - 46% (does not correlate with echocardiographic EF of 50-55%)  . PROSTATE BIOPSY  05/15/12   Clinically both [place]  . SPACE OAR INSTILLATION N/A 09/09/2017   Procedure: SPACE OAR  INSTILLATION;  Surgeon: [name], MD;  Location: [facility];  Service: Urology;  Laterality: N/A;  . TEE WITHOUT CARDIOVERSION N/A 05/07/2014   Procedure: TRANSESOPHAGEAL ECHOCARDIOGRAM (TEE);  Surgeon: [name], MD;  Location: [facility] ENDOSCOPY;  Service: Cardiovascular;  Laterality: N/A;   mild atherosclerosis plaque of aorta,  mild AR, MR, and TR,  no cardiac source of emboli  . TONSILLECTOMY  as child  . TRANSTHORACIC ECHOCARDIOGRAM  11/'18; 1/'19   a) In setting of sepsis: EF of 40-45%.  Diffuse hypokinesis.  No RWMA.  Biatrial enlargement.;; b) ** f/u Jan 2019: Normal LVF 55-60%** , mildly dilated aortic root(38 mm) and ascending aorta (44 mm). Compared to prior echo, LVEF has improved.  . TRANSTHORACIC ECHOCARDIOGRAM  7/'20; 9/'20   a) Hemorrhagic CVA/SAH - EF> 65%.  Moderate concentric LVH.-GR 1 DD mild RA dilation.; b) EF 60 to 65%.  Mild LVH.  GRII DD.  Normal RV size and function.  Mild LA dilation.  Normal RA size.  Unable to assess PA pressures.  No significant aortic sclerosis.  . TRANSURETHRAL RESECTION OF PROSTATE N/A 05/30/2017   Procedure: TRANSURETHRAL RESECTION OF THE PROSTATE (TURP);  Surgeon: [name], MD;  Location: [facility];  Service: Urology;  Laterality: N/A;    Current Medications: Current Meds  Medication Sig  . albuterol (VENTOLIN HFA) 108 (90 Base) MCG/ACT inhaler Inhale 2 puffs into the lungs every 6 (six) hours as needed for wheezing or shortness of breath.  Marland Kitchen ascorbic acid (VITAMIN C) 500 MG tablet Take 500 mg by mouth daily.  Marland Kitchen aspirin EC 81 MG EC tablet Take 1 tablet (81 mg total) by mouth daily. Swallow whole.  Marland Kitchen atorvastatin (LIPITOR) 10 MG tablet TAKE 1 TABLET BY MOUTH DAILY EACH EVENING  . azelastine (ASTELIN) 0.1 % nasal spray Place 1 spray into both nostrils 2 (two) times daily as needed for rhinitis or allergies.  . Blood Glucose Monitoring Suppl (ACCU-CHEK AVIVA PLUS) w/Device KIT Use blood glucose machine as instructed  on your strips  . budesonide-formoterol (SYMBICORT) 160-4.5 MCG/ACT inhaler Inhale 2 puffs into the lungs 2 (two) times daily.  . carvedilol (COREG) 6.25 MG tablet Take 1 tablet (6.25 mg total) by mouth 2 (two) times daily with a meal.  . Cholecalciferol (VITAMIN D-3 PO) Take 1 capsule by mouth daily.  . colchicine 0.6 MG tablet TAKE 1 TABLET (0.6 MG TOTAL) BY MOUTH 2 (TWO) TIMES DAILY AS NEEDED (GOUT FLARE).  Marland Kitchen divalproex (DEPAKOTE) 500 MG DR tablet TAKE 1 TABLET BY MOUTH EVERY 12 HOURS  . fluticasone (FLONASE) 50 MCG/ACT nasal spray 1 spray by Each Nare route daily.  . furosemide (LASIX) 40 MG tablet TAKE 1 TABLET (40 MG TOTAL) BY MOUTH  2 (TWO) TIMES DAILY WITH BREAKFAST AND LUNCH. (Patient taking differently: Take 20 mg by mouth daily. )  . glipiZIDE (GLUCOTROL XL) 5 MG 24 hr tablet Take by mouth.  Marland Kitchen glucose blood (ACCU-CHEK AVIVA PLUS) test strip Test 2 times daily dx e11.9  . Lancets (ACCU-CHEK SOFT TOUCH) lancets Check blood sugars once per day. DX: E11.9  . magnesium oxide (MAG-OX) 400 (241.3 Mg) MG tablet Take 1 tablet by mouth 3 (three) times daily.  . metFORMIN (GLUCOPHAGE-XR) 500 MG 24 hr tablet Take by mouth.  . montelukast (SINGULAIR) 10 MG tablet TAKE 1 TABLET BY MOUTH EVERYDAY AT BEDTIME  . MYRBETRIQ 25 MG TB24 tablet Take 25 mg by mouth daily.  . nicotine (NICODERM CQ - DOSED IN MG/24 HOURS) 14 mg/24hr patch Place 1 patch (14 mg total) onto the skin daily.  . nicotine (NICODERM CQ - DOSED IN MG/24 HR) 7 mg/24hr patch 7 mg daily.  Marland Kitchen PRESCRIPTION MEDICATION CPAP- At bedtime  . QUEtiapine (SEROQUEL) 25 MG tablet Take by mouth.  . Sodium Sulfate-Mag Sulfate-KCl (SUTAB) [phone] MG TABS Take 1 kit by mouth as directed. BIN: [id number] PCN: CN GROUP: [id number] MEMBER ID: [id number];TIW AS CASH;NO PRIOR AUTHORIZATION  . [DISCONTINUED] amiodarone (PACERONE) 200 MG tablet Take 0.5 tablets (100 mg total) by mouth daily. (Patient taking differently: Take 200 mg by mouth 2 (two) times daily.  )  . [DISCONTINUED] losartan (COZAAR) 50 MG tablet Take 1 tablet (50 mg total) by mouth daily. (Patient taking differently: Take 100 mg by mouth in the morning and at bedtime. )  . [DISCONTINUED] zinc gluconate 50 MG tablet Take 50 mg by mouth daily.     Allergies:   Patient has no known allergies.   Social History   Socioeconomic History  . Marital status: Married    Spouse name: Not on file  . Number of children: 2  . Years of education: 16  . Highest education level: Bachelor's degree (e.g., BA, AB, BS)  Occupational History  . Occupation: Scientist, clinical (histocompatibility and immunogenetics): [employer]  Tobacco Use  . Smoking status: Current Every Day Smoker    Packs/day: 1.00    Years: 60.00    Pack years: 60.00    Types: Cigarettes  . Smokeless tobacco: Never Used  . Tobacco comment: half pack daily  Vaping Use  . Vaping Use: Every day  Substance and Sexual Activity  . Alcohol use: Not Currently  . Drug use: No  . Sexual activity: Not on file  Other Topics Concern  . Not on file  Social History Narrative   Married 25 years   2 children but not with this wife   Left handed    Likes to read, netflix, tv   Social Determinants of Health   Financial Resource Strain: Low Risk   . Difficulty of Paying Living Expenses: Not hard at all  Food Insecurity: No Food Insecurity  . Worried About Charity fundraiser in the Last Year: Never true  . Ran Out of Food in the Last Year: Never true  Transportation Needs: No Transportation Needs  . Lack of Transportation (Medical): No  . Lack of Transportation (Non-Medical): No  Physical Activity: Inactive  . Days of Exercise per Week: 0 days  . Minutes of Exercise per Session: 0 min  Stress: No Stress Concern Present  . Feeling of Stress : Only a little  Social Connections: Moderately Isolated  . Frequency of Communication with Friends and Family: More than three times a  week  . Frequency of Social Gatherings with Friends and Family: Once a week  . Attends  Religious Services: Never  . Active Member of Clubs or Organizations: No  . Attends Archivist Meetings: Never  . Marital Status: Married     Family History: The patient's family history includes Breast cancer in his mother; Ovarian cancer in his sister; Stroke in his mother and another family member. There is no history of Colon cancer, Esophageal cancer, Rectal cancer, or Stomach cancer.  ROS:   Please see the history of present illness.    All other systems reviewed and are negative.  EKGs/Labs/Other Studies Reviewed:    The following studies were reviewed today: Hospital records  EKG:  The ekg ordered today demonstrates normal sinus rhythm  Recent Labs: 05/19/2019: Magnesium 1.9 12/18/2019: B Natriuretic Peptide 111.2 12/22/2019: ALT 9; BUN 22; Creatinine, Ser 1.26; Hemoglobin 16.6; Platelets 190; Potassium 4.0; Sodium 132 01/06/2020: TSH 2.879  Recent Lipid Panel    Component Value Date/Time   CHOL 125 12/18/2019 0644   TRIG 97 12/18/2019 0644   HDL 30 (L) 12/18/2019 0644   CHOLHDL 4.2 12/18/2019 0644   VLDL 19 12/18/2019 0644   LDLCALC 76 12/18/2019 0644   LDLDIRECT 69.0 04/22/2018 1210    Physical Exam:    VS:  BP 140/78   Pulse 75   Ht '5\' 11"'  (1.803 m)   Wt 225 lb (102.1 kg)   SpO2 98%   BMI 31.38 kg/m     Wt Readings from Last 3 Encounters:  01/21/20 225 lb (102.1 kg)  01/11/20 226 lb 6.4 oz (102.7 kg)  01/06/20 222 lb 6.4 oz (100.9 kg)     GEN:  Well nourished, well developed in no acute distress HEENT: Normal NECK: No JVD; No carotid bruits LYMPHATICS: No lymphadenopathy CARDIAC: RRR, no murmurs, rubs, gallops RESPIRATORY:  Clear to auscultation without rales, wheezing or rhonchi  ABDOMEN: Soft, non-tender, non-distended MUSCULOSKELETAL:  No edema; No deformity  SKIN: Warm and dry NEUROLOGIC:  Alert and oriented x 3 PSYCHIATRIC:  Normal affect   ASSESSMENT:    1. Persistent atrial fibrillation (HCC)   2. Stage 3 chronic kidney  disease, unspecified whether stage 3a or 3b CKD ([place])   3. Nontraumatic hemorrhage of right cerebral hemisphere [facility])    PLAN:    In order of problems listed above:  1. Persistent atrial fibrillation Off anticoagulation given history of right occipital intracranial hemorrhage.  Neurology believes this to have been a hemorrhagic conversion from an embolic stroke given his history of atrial fibrillation.  He is being worked up for the [place].  I think he is a ideal candidate for the device.  His neurology team thinks he is a suitable candidate for short-term anticoagulation.  We will proceed with the work-up including a CT.  We will tentatively schedule him for November 4 implant date.  We will plan on him restarting his Eliquis at home on November 1.  Anticipate Eliquis 5 mg by mouth twice daily in addition aspirin 81 mg once daily for the 45 days following implant.  Risks and benefits of the watchman implant were discussed in detail with the patient and his wife today and both still wished to proceed.   Medication Adjustments/Labs and Tests Ordered: Current medicines are reviewed at length with the patient today.  Concerns regarding medicines are outlined above.  Orders Placed This Encounter  Procedures  . Basic Metabolic Panel (BMET)  . CBC w/Diff  . EKG  12-Lead   Meds ordered this encounter  Medications  . metoprolol tartrate (LOPRESSOR) 100 MG tablet    Sig: Take one tablet by mouth 2 hours prior to cardiac CT    Dispense:  1 tablet    Refill:  0  . losartan (COZAAR) 100 MG tablet    Sig: Take 1 tablet (100 mg total) by mouth daily.    Dispense:  90 tablet    Refill:  3  . apixaban (ELIQUIS) 5 MG TABS tablet    Sig: Take 1 tablet (5 mg total) by mouth 2 (two) times daily.    Dispense:  60 tablet    Refill:  11     Signed, Lars Mage, MD, [facility]  01/21/2020 2:12 PM    Electrophysiology [facility]

## 2020-01-22 DIAGNOSIS — I69351 Hemiplegia and hemiparesis following cerebral infarction affecting right dominant side: Secondary | ICD-10-CM | POA: Diagnosis not present

## 2020-01-22 DIAGNOSIS — M6281 Muscle weakness (generalized): Secondary | ICD-10-CM | POA: Diagnosis not present

## 2020-01-22 DIAGNOSIS — I69398 Other sequelae of cerebral infarction: Secondary | ICD-10-CM | POA: Diagnosis not present

## 2020-01-22 DIAGNOSIS — H547 Unspecified visual loss: Secondary | ICD-10-CM | POA: Diagnosis not present

## 2020-01-22 DIAGNOSIS — I69198 Other sequelae of nontraumatic intracerebral hemorrhage: Secondary | ICD-10-CM | POA: Diagnosis not present

## 2020-01-22 DIAGNOSIS — G4089 Other seizures: Secondary | ICD-10-CM | POA: Diagnosis not present

## 2020-01-22 LAB — CBC WITH DIFFERENTIAL/PLATELET
Basophils Absolute: 0 10*3/uL (ref 0.0–0.2)
Basos: 1 %
EOS (ABSOLUTE): 0.1 10*3/uL (ref 0.0–0.4)
Eos: 2 %
Hematocrit: 40.9 % (ref 37.5–51.0)
Hemoglobin: 14.7 g/dL (ref 13.0–17.7)
Immature Grans (Abs): 0 10*3/uL (ref 0.0–0.1)
Immature Granulocytes: 1 %
Lymphocytes Absolute: 1.2 10*3/uL (ref 0.7–3.1)
Lymphs: 25 %
MCH: 34.1 pg — ABNORMAL HIGH (ref 26.6–33.0)
MCHC: 35.9 g/dL — ABNORMAL HIGH (ref 31.5–35.7)
MCV: 95 fL (ref 79–97)
Monocytes Absolute: 0.4 10*3/uL (ref 0.1–0.9)
Monocytes: 8 %
Neutrophils Absolute: 2.9 10*3/uL (ref 1.4–7.0)
Neutrophils: 63 %
Platelets: 210 10*3/uL (ref 150–450)
RBC: 4.31 x10E6/uL (ref 4.14–5.80)
RDW: 13.4 % (ref 11.6–15.4)
WBC: 4.6 10*3/uL (ref 3.4–10.8)

## 2020-01-22 LAB — BASIC METABOLIC PANEL
BUN/Creatinine Ratio: 9 — ABNORMAL LOW (ref 10–24)
BUN: 12 mg/dL (ref 8–27)
CO2: 26 mmol/L (ref 20–29)
Calcium: 8.7 mg/dL (ref 8.6–10.2)
Chloride: 99 mmol/L (ref 96–106)
Creatinine, Ser: 1.33 mg/dL — ABNORMAL HIGH (ref 0.76–1.27)
GFR calc Af Amer: 60 mL/min/{1.73_m2} (ref >59)
GFR calc non Af Amer: 52 mL/min/{1.73_m2} — ABNORMAL LOW (ref >59)
Glucose: 167 mg/dL — ABNORMAL HIGH (ref 65–99)
Potassium: 4.1 mmol/L (ref 3.5–5.2)
Sodium: 139 mmol/L (ref 134–144)

## 2020-01-23 NOTE — Telephone Encounter (Signed)
So, this test was ordered by Dr. [name] from electrophysiology.  It is for his upcoming watchman procedure. I'll defer recommendations to Dr.Lambert since this is an electrophysiology issue with his atrial fibrillation.  He is probably better suited answer the question.  Glenetta Hew, MD

## 2020-01-25 ENCOUNTER — Telehealth: Payer: Self-pay | Admitting: Cardiology

## 2020-01-25 DIAGNOSIS — H547 Unspecified visual loss: Secondary | ICD-10-CM | POA: Diagnosis not present

## 2020-01-25 DIAGNOSIS — M6281 Muscle weakness (generalized): Secondary | ICD-10-CM | POA: Diagnosis not present

## 2020-01-25 DIAGNOSIS — I69351 Hemiplegia and hemiparesis following cerebral infarction affecting right dominant side: Secondary | ICD-10-CM | POA: Diagnosis not present

## 2020-01-25 DIAGNOSIS — I69398 Other sequelae of cerebral infarction: Secondary | ICD-10-CM | POA: Diagnosis not present

## 2020-01-25 DIAGNOSIS — I69198 Other sequelae of nontraumatic intracerebral hemorrhage: Secondary | ICD-10-CM | POA: Diagnosis not present

## 2020-01-25 DIAGNOSIS — G4089 Other seizures: Secondary | ICD-10-CM | POA: Diagnosis not present

## 2020-01-25 NOTE — Telephone Encounter (Signed)
Returned call to Pt's wife.  Pt's wife states she has been giving Pt extra amiodarone when he has afib per his Chad.  Advised NOT to give extra amiodarone.  Take as prescribed.  States sometimes kardia saw SR with ectopy.  Advised these are probably PVC's and are not a concern.

## 2020-01-25 NOTE — Telephone Encounter (Signed)
   Pt's wife is calling, she would like to speak with Dr. Mardene Speak nurse she said that have question about pt's watchman CT

## 2020-01-26 ENCOUNTER — Telehealth (HOSPITAL_COMMUNITY): Payer: Self-pay | Admitting: Emergency Medicine

## 2020-01-26 NOTE — Telephone Encounter (Signed)
Reaching out to patient to offer assistance regarding upcoming cardiac imaging study; pt verbalizes understanding of appt date/time, parking situation and where to check in, pre-test NPO status and medications ordered, and verified current allergies; name and call back number provided for further questions should they arise Marchia Bond RN [place] [facility] [phone] office [phone] cell   Spoke with wife who is very knowledgable about patients daily medications. She verbalized understanding to hold diabetes medications, lasix, losartan and coreg the morning of scan. He will take amiodarone per usual AND 100mg  metoprolol 2 hr prior to appt. She thanked me for verifying this with her. Clarise Cruz

## 2020-01-27 ENCOUNTER — Ambulatory Visit (HOSPITAL_COMMUNITY)
Admission: RE | Admit: 2020-01-27 | Discharge: 2020-01-27 | Disposition: A | Discharge status: Home / Self Care | Payer: Medicare Other | Source: Ambulatory Visit | Attending: Physician Assistant | Admitting: Physician Assistant

## 2020-01-27 ENCOUNTER — Other Ambulatory Visit: Payer: Self-pay

## 2020-01-27 DIAGNOSIS — I4819 Other persistent atrial fibrillation: Secondary | ICD-10-CM | POA: Diagnosis not present

## 2020-01-27 MED ORDER — IOHEXOL 350 MG/ML SOLN
80.0000 mL | Freq: Once | INTRAVENOUS | Status: AC | PRN
  Administered 2020-01-27: 80 mL via INTRAVENOUS

## 2020-01-28 DIAGNOSIS — H547 Unspecified visual loss: Secondary | ICD-10-CM | POA: Diagnosis not present

## 2020-01-28 DIAGNOSIS — I69398 Other sequelae of cerebral infarction: Secondary | ICD-10-CM | POA: Diagnosis not present

## 2020-01-28 DIAGNOSIS — G4089 Other seizures: Secondary | ICD-10-CM | POA: Diagnosis not present

## 2020-01-28 DIAGNOSIS — M6281 Muscle weakness (generalized): Secondary | ICD-10-CM | POA: Diagnosis not present

## 2020-01-28 DIAGNOSIS — I69198 Other sequelae of nontraumatic intracerebral hemorrhage: Secondary | ICD-10-CM | POA: Diagnosis not present

## 2020-01-28 DIAGNOSIS — I69351 Hemiplegia and hemiparesis following cerebral infarction affecting right dominant side: Secondary | ICD-10-CM | POA: Diagnosis not present

## 2020-02-01 ENCOUNTER — Telehealth: Payer: Self-pay

## 2020-02-01 NOTE — Telephone Encounter (Signed)
Call placed to Pt/wife.  Advised covid test 11/2 at 10:00 am-address given  Verbal instruction given for surgical date 02/11/20 (see letter)  Also sending letter via mail  Work up complete.

## 2020-02-01 NOTE — Telephone Encounter (Signed)
-----   Message from Vickie Epley, MD sent at 01/21/2020  2:16 PM EDT ----- I saw Mr Dunnam today in clinic. He is back in sinus. Lets get him scheduled for the nov 4th implant date. He will restart his eliquis on nov 1. Sonia Baller is working to get his CT scheduled.  Thx,CL

## 2020-02-02 ENCOUNTER — Other Ambulatory Visit: Payer: Self-pay | Admitting: Physician Assistant

## 2020-02-02 DIAGNOSIS — I612 Nontraumatic intracerebral hemorrhage in hemisphere, unspecified: Secondary | ICD-10-CM

## 2020-02-02 DIAGNOSIS — I4819 Other persistent atrial fibrillation: Secondary | ICD-10-CM

## 2020-02-02 DIAGNOSIS — Z7901 Long term (current) use of anticoagulants: Secondary | ICD-10-CM

## 2020-02-04 ENCOUNTER — Telehealth: Payer: Self-pay | Admitting: Nurse Practitioner

## 2020-02-04 DIAGNOSIS — J84115 Respiratory bronchiolitis interstitial lung disease: Secondary | ICD-10-CM | POA: Diagnosis not present

## 2020-02-04 DIAGNOSIS — J309 Allergic rhinitis, unspecified: Secondary | ICD-10-CM | POA: Diagnosis not present

## 2020-02-04 DIAGNOSIS — E1122 Type 2 diabetes mellitus with diabetic chronic kidney disease: Secondary | ICD-10-CM | POA: Diagnosis not present

## 2020-02-04 DIAGNOSIS — I69198 Other sequelae of nontraumatic intracerebral hemorrhage: Secondary | ICD-10-CM | POA: Diagnosis not present

## 2020-02-04 DIAGNOSIS — I69398 Other sequelae of cerebral infarction: Secondary | ICD-10-CM | POA: Diagnosis not present

## 2020-02-04 DIAGNOSIS — I69351 Hemiplegia and hemiparesis following cerebral infarction affecting right dominant side: Secondary | ICD-10-CM | POA: Diagnosis not present

## 2020-02-04 DIAGNOSIS — G4089 Other seizures: Secondary | ICD-10-CM | POA: Diagnosis not present

## 2020-02-04 DIAGNOSIS — N3000 Acute cystitis without hematuria: Secondary | ICD-10-CM | POA: Diagnosis not present

## 2020-02-04 DIAGNOSIS — J449 Chronic obstructive pulmonary disease, unspecified: Secondary | ICD-10-CM | POA: Diagnosis not present

## 2020-02-04 DIAGNOSIS — C61 Malignant neoplasm of prostate: Secondary | ICD-10-CM | POA: Diagnosis not present

## 2020-02-04 DIAGNOSIS — E785 Hyperlipidemia, unspecified: Secondary | ICD-10-CM | POA: Diagnosis not present

## 2020-02-04 DIAGNOSIS — I5043 Acute on chronic combined systolic (congestive) and diastolic (congestive) heart failure: Secondary | ICD-10-CM | POA: Diagnosis not present

## 2020-02-04 DIAGNOSIS — G4733 Obstructive sleep apnea (adult) (pediatric): Secondary | ICD-10-CM | POA: Diagnosis not present

## 2020-02-04 DIAGNOSIS — I130 Hypertensive heart and chronic kidney disease with heart failure and stage 1 through stage 4 chronic kidney disease, or unspecified chronic kidney disease: Secondary | ICD-10-CM | POA: Diagnosis not present

## 2020-02-04 DIAGNOSIS — N183 Chronic kidney disease, stage 3 unspecified: Secondary | ICD-10-CM | POA: Diagnosis not present

## 2020-02-04 DIAGNOSIS — H547 Unspecified visual loss: Secondary | ICD-10-CM | POA: Diagnosis not present

## 2020-02-04 DIAGNOSIS — Z720 Tobacco use: Secondary | ICD-10-CM | POA: Diagnosis not present

## 2020-02-04 DIAGNOSIS — E1165 Type 2 diabetes mellitus with hyperglycemia: Secondary | ICD-10-CM | POA: Diagnosis not present

## 2020-02-04 DIAGNOSIS — M6281 Muscle weakness (generalized): Secondary | ICD-10-CM | POA: Diagnosis not present

## 2020-02-04 DIAGNOSIS — I4811 Longstanding persistent atrial fibrillation: Secondary | ICD-10-CM | POA: Diagnosis not present

## 2020-02-04 DIAGNOSIS — I429 Cardiomyopathy, unspecified: Secondary | ICD-10-CM | POA: Diagnosis not present

## 2020-02-04 DIAGNOSIS — E669 Obesity, unspecified: Secondary | ICD-10-CM | POA: Diagnosis not present

## 2020-02-04 DIAGNOSIS — I719 Aortic aneurysm of unspecified site, without rupture: Secondary | ICD-10-CM | POA: Diagnosis not present

## 2020-02-04 DIAGNOSIS — Z6830 Body mass index (BMI) 30.0-30.9, adult: Secondary | ICD-10-CM | POA: Diagnosis not present

## 2020-02-04 DIAGNOSIS — M1030 Gout due to renal impairment, unspecified site: Secondary | ICD-10-CM | POA: Diagnosis not present

## 2020-02-04 NOTE — Telephone Encounter (Signed)
Call placed to review instructions for resuming Eliquis and to answer any questions or concerns regarding the upcoming procedure on 11/4. Spoke with Mrs. Linares and reviewed instructions and answered all questions regarding the procedure/ I advised her to call me if she has any other questions or concerns prior to 11/4 procedure.   Ambrose Pancoast, RN Watchman Coordinator

## 2020-02-09 ENCOUNTER — Other Ambulatory Visit (HOSPITAL_COMMUNITY)
Admission: RE | Admit: 2020-02-09 | Discharge: 2020-02-09 | Disposition: A | Discharge status: Home / Self Care | Payer: Medicare Other | Source: Ambulatory Visit | Attending: Cardiology | Admitting: Cardiology

## 2020-02-09 DIAGNOSIS — Z20822 Contact with and (suspected) exposure to covid-19: Secondary | ICD-10-CM | POA: Insufficient documentation

## 2020-02-09 DIAGNOSIS — Z01812 Encounter for preprocedural laboratory examination: Secondary | ICD-10-CM | POA: Insufficient documentation

## 2020-02-09 LAB — SARS CORONAVIRUS 2 (TAT 6-24 HRS): SARS Coronavirus 2: NEGATIVE

## 2020-02-10 DIAGNOSIS — I69398 Other sequelae of cerebral infarction: Secondary | ICD-10-CM | POA: Diagnosis not present

## 2020-02-10 DIAGNOSIS — M6281 Muscle weakness (generalized): Secondary | ICD-10-CM | POA: Diagnosis not present

## 2020-02-10 DIAGNOSIS — I69198 Other sequelae of nontraumatic intracerebral hemorrhage: Secondary | ICD-10-CM | POA: Diagnosis not present

## 2020-02-10 DIAGNOSIS — G4089 Other seizures: Secondary | ICD-10-CM | POA: Diagnosis not present

## 2020-02-10 DIAGNOSIS — H547 Unspecified visual loss: Secondary | ICD-10-CM | POA: Diagnosis not present

## 2020-02-10 DIAGNOSIS — I69351 Hemiplegia and hemiparesis following cerebral infarction affecting right dominant side: Secondary | ICD-10-CM | POA: Diagnosis not present

## 2020-02-10 NOTE — Progress Notes (Signed)
Patient's wife called with pre-op instructions. Verbalized understanding. Stated Ronald Cass, RN from office had mailed a letter and reviewed it in great detail with them regarding instructions. Reminded patient's wife to have patient npo p midnight and to arrive at 1215pm

## 2020-02-11 ENCOUNTER — Inpatient Hospital Stay (HOSPITAL_COMMUNITY): Payer: Medicare Other | Admitting: Certified Registered"

## 2020-02-11 ENCOUNTER — Inpatient Hospital Stay (HOSPITAL_COMMUNITY)
Admission: RE | Admit: 2020-02-11 | Discharge: 2020-02-12 | DRG: 274 | Disposition: A | Discharge status: Home / Self Care | Payer: Medicare Other | Attending: Cardiology | Admitting: Cardiology

## 2020-02-11 ENCOUNTER — Ambulatory Visit (HOSPITAL_COMMUNITY)
Admission: RE | Admit: 2020-02-11 | Discharge: 2020-02-11 | Disposition: A | Discharge status: Home / Self Care | Payer: Medicare Other | Source: Ambulatory Visit | Attending: Physician Assistant | Admitting: Physician Assistant

## 2020-02-11 ENCOUNTER — Encounter (HOSPITAL_COMMUNITY): Payer: Self-pay | Admitting: Cardiology

## 2020-02-11 ENCOUNTER — Encounter (HOSPITAL_COMMUNITY)
Admission: RE | Disposition: A | Discharge status: Home / Self Care | Payer: Self-pay | Source: Home / Self Care | Attending: Cardiology

## 2020-02-11 ENCOUNTER — Other Ambulatory Visit: Payer: Self-pay

## 2020-02-11 DIAGNOSIS — Z7951 Long term (current) use of inhaled steroids: Secondary | ICD-10-CM | POA: Diagnosis not present

## 2020-02-11 DIAGNOSIS — N400 Enlarged prostate without lower urinary tract symptoms: Secondary | ICD-10-CM | POA: Diagnosis present

## 2020-02-11 DIAGNOSIS — E876 Hypokalemia: Secondary | ICD-10-CM | POA: Diagnosis not present

## 2020-02-11 DIAGNOSIS — Z7982 Long term (current) use of aspirin: Secondary | ICD-10-CM

## 2020-02-11 DIAGNOSIS — Z9079 Acquired absence of other genital organ(s): Secondary | ICD-10-CM

## 2020-02-11 DIAGNOSIS — Z974 Presence of external hearing-aid: Secondary | ICD-10-CM

## 2020-02-11 DIAGNOSIS — J449 Chronic obstructive pulmonary disease, unspecified: Secondary | ICD-10-CM | POA: Diagnosis not present

## 2020-02-11 DIAGNOSIS — E785 Hyperlipidemia, unspecified: Secondary | ICD-10-CM | POA: Diagnosis present

## 2020-02-11 DIAGNOSIS — Z006 Encounter for examination for normal comparison and control in clinical research program: Secondary | ICD-10-CM

## 2020-02-11 DIAGNOSIS — Z8546 Personal history of malignant neoplasm of prostate: Secondary | ICD-10-CM

## 2020-02-11 DIAGNOSIS — I4819 Other persistent atrial fibrillation: Secondary | ICD-10-CM | POA: Diagnosis not present

## 2020-02-11 DIAGNOSIS — I130 Hypertensive heart and chronic kidney disease with heart failure and stage 1 through stage 4 chronic kidney disease, or unspecified chronic kidney disease: Secondary | ICD-10-CM | POA: Diagnosis not present

## 2020-02-11 DIAGNOSIS — Z7984 Long term (current) use of oral hypoglycemic drugs: Secondary | ICD-10-CM

## 2020-02-11 DIAGNOSIS — I428 Other cardiomyopathies: Secondary | ICD-10-CM | POA: Diagnosis not present

## 2020-02-11 DIAGNOSIS — N183 Chronic kidney disease, stage 3 unspecified: Secondary | ICD-10-CM | POA: Diagnosis not present

## 2020-02-11 DIAGNOSIS — I612 Nontraumatic intracerebral hemorrhage in hemisphere, unspecified: Secondary | ICD-10-CM

## 2020-02-11 DIAGNOSIS — J84115 Respiratory bronchiolitis interstitial lung disease: Secondary | ICD-10-CM | POA: Diagnosis present

## 2020-02-11 DIAGNOSIS — Z79899 Other long term (current) drug therapy: Secondary | ICD-10-CM

## 2020-02-11 DIAGNOSIS — Z20822 Contact with and (suspected) exposure to covid-19: Secondary | ICD-10-CM | POA: Diagnosis present

## 2020-02-11 DIAGNOSIS — I5042 Chronic combined systolic (congestive) and diastolic (congestive) heart failure: Secondary | ICD-10-CM | POA: Diagnosis present

## 2020-02-11 DIAGNOSIS — J9601 Acute respiratory failure with hypoxia: Secondary | ICD-10-CM | POA: Diagnosis not present

## 2020-02-11 DIAGNOSIS — G4733 Obstructive sleep apnea (adult) (pediatric): Secondary | ICD-10-CM | POA: Diagnosis present

## 2020-02-11 DIAGNOSIS — I480 Paroxysmal atrial fibrillation: Secondary | ICD-10-CM | POA: Diagnosis not present

## 2020-02-11 DIAGNOSIS — Z7901 Long term (current) use of anticoagulants: Secondary | ICD-10-CM | POA: Diagnosis not present

## 2020-02-11 DIAGNOSIS — E1122 Type 2 diabetes mellitus with diabetic chronic kidney disease: Secondary | ICD-10-CM | POA: Diagnosis present

## 2020-02-11 DIAGNOSIS — J302 Other seasonal allergic rhinitis: Secondary | ICD-10-CM | POA: Diagnosis present

## 2020-02-11 DIAGNOSIS — M109 Gout, unspecified: Secondary | ICD-10-CM | POA: Diagnosis present

## 2020-02-11 DIAGNOSIS — I4891 Unspecified atrial fibrillation: Secondary | ICD-10-CM | POA: Diagnosis present

## 2020-02-11 DIAGNOSIS — F17210 Nicotine dependence, cigarettes, uncomplicated: Secondary | ICD-10-CM | POA: Diagnosis present

## 2020-02-11 DIAGNOSIS — Z8673 Personal history of transient ischemic attack (TIA), and cerebral infarction without residual deficits: Secondary | ICD-10-CM

## 2020-02-11 HISTORY — PX: LEFT ATRIAL APPENDAGE OCCLUSION: EP1229

## 2020-02-11 LAB — TYPE AND SCREEN
ABO/RH(D): A POS
Antibody Screen: NEGATIVE

## 2020-02-11 LAB — POCT ACTIVATED CLOTTING TIME: Activated Clotting Time: 274 seconds

## 2020-02-11 LAB — GLUCOSE, CAPILLARY
Glucose-Capillary: 115 mg/dL — ABNORMAL HIGH (ref 70–99)
Glucose-Capillary: 132 mg/dL — ABNORMAL HIGH (ref 70–99)

## 2020-02-11 LAB — SURGICAL PCR SCREEN
MRSA, PCR: NEGATIVE
Staphylococcus aureus: POSITIVE — AB

## 2020-02-11 SURGERY — LEFT ATRIAL APPENDAGE OCCLUSION
Anesthesia: General

## 2020-02-11 MED ORDER — MOMETASONE FURO-FORMOTEROL FUM 200-5 MCG/ACT IN AERO
2.0000 | INHALATION_SPRAY | Freq: Two times a day (BID) | RESPIRATORY_TRACT | Status: DC
  Administered 2020-02-11 – 2020-02-12 (×2): 2 via RESPIRATORY_TRACT
  Filled 2020-02-11: qty 8.8

## 2020-02-11 MED ORDER — HEPARIN (PORCINE) IN NACL 2000-0.9 UNIT/L-% IV SOLN
INTRAVENOUS | Status: AC
  Filled 2020-02-11: qty 1000

## 2020-02-11 MED ORDER — MIRABEGRON ER 25 MG PO TB24
25.0000 mg | ORAL_TABLET | Freq: Every day | ORAL | Status: DC
  Administered 2020-02-12: 25 mg via ORAL
  Filled 2020-02-11: qty 1

## 2020-02-11 MED ORDER — MONTELUKAST SODIUM 10 MG PO TABS
10.0000 mg | ORAL_TABLET | Freq: Every day | ORAL | Status: DC
  Administered 2020-02-11: 10 mg via ORAL
  Filled 2020-02-11: qty 1

## 2020-02-11 MED ORDER — HEPARIN (PORCINE) IN NACL 1000-0.9 UT/500ML-% IV SOLN
INTRAVENOUS | Status: AC
  Filled 2020-02-11: qty 500

## 2020-02-11 MED ORDER — ATORVASTATIN CALCIUM 10 MG PO TABS: 10.0000 mg | ORAL_TABLET | Freq: Every evening | ORAL | Status: DC

## 2020-02-11 MED ORDER — PROTAMINE SULFATE 10 MG/ML IV SOLN
INTRAVENOUS | Status: DC | PRN
  Administered 2020-02-11: 20 mg via INTRAVENOUS

## 2020-02-11 MED ORDER — LOSARTAN POTASSIUM 50 MG PO TABS
100.0000 mg | ORAL_TABLET | Freq: Every day | ORAL | Status: DC
  Administered 2020-02-12: 100 mg via ORAL
  Filled 2020-02-11: qty 2

## 2020-02-11 MED ORDER — DEXAMETHASONE SODIUM PHOSPHATE 10 MG/ML IJ SOLN
INTRAMUSCULAR | Status: DC | PRN
  Administered 2020-02-11: 5 mg via INTRAVENOUS

## 2020-02-11 MED ORDER — CEFAZOLIN SODIUM-DEXTROSE 2-4 GM/100ML-% IV SOLN
INTRAVENOUS | Status: AC
  Filled 2020-02-11: qty 100

## 2020-02-11 MED ORDER — SODIUM CHLORIDE 0.9 % IV SOLN: INTRAVENOUS | Status: DC

## 2020-02-11 MED ORDER — PROPOFOL 10 MG/ML IV BOLUS
INTRAVENOUS | Status: DC | PRN
  Administered 2020-02-11: 140 mg via INTRAVENOUS
  Administered 2020-02-11: 20 mg via INTRAVENOUS
  Administered 2020-02-11: 40 mg via INTRAVENOUS

## 2020-02-11 MED ORDER — AMIODARONE HCL 200 MG PO TABS
200.0000 mg | ORAL_TABLET | Freq: Every day | ORAL | Status: DC
  Administered 2020-02-12: 200 mg via ORAL
  Filled 2020-02-11: qty 1

## 2020-02-11 MED ORDER — MUPIROCIN 2 % EX OINT
1.0000 "application " | TOPICAL_OINTMENT | Freq: Two times a day (BID) | CUTANEOUS | Status: DC
  Administered 2020-02-12: 1 via NASAL
  Filled 2020-02-11: qty 22

## 2020-02-11 MED ORDER — HEPARIN SODIUM (PORCINE) 1000 UNIT/ML IJ SOLN
INTRAMUSCULAR | Status: DC | PRN
  Administered 2020-02-11: 15000 [IU] via INTRAVENOUS

## 2020-02-11 MED ORDER — ONDANSETRON HCL 4 MG/2ML IJ SOLN
INTRAMUSCULAR | Status: DC | PRN
  Administered 2020-02-11: 4 mg via INTRAVENOUS

## 2020-02-11 MED ORDER — ACETAMINOPHEN 325 MG PO TABS: 650.0000 mg | ORAL_TABLET | ORAL | Status: DC | PRN

## 2020-02-11 MED ORDER — ACETAMINOPHEN 500 MG PO TABS
ORAL_TABLET | ORAL | Status: AC
  Filled 2020-02-11: qty 2

## 2020-02-11 MED ORDER — DIVALPROEX SODIUM 500 MG PO DR TAB
500.0000 mg | DELAYED_RELEASE_TABLET | Freq: Two times a day (BID) | ORAL | Status: DC
  Administered 2020-02-11 – 2020-02-12 (×2): 500 mg via ORAL
  Filled 2020-02-11 (×2): qty 1

## 2020-02-11 MED ORDER — ACETAMINOPHEN 500 MG PO TABS
1000.0000 mg | ORAL_TABLET | Freq: Four times a day (QID) | ORAL | Status: DC | PRN
  Administered 2020-02-11: 1000 mg via ORAL

## 2020-02-11 MED ORDER — SODIUM CHLORIDE 0.9 % IV SOLN: 250.0000 mL | INTRAVENOUS | Status: DC | PRN

## 2020-02-11 MED ORDER — CHLORHEXIDINE GLUCONATE 0.12 % MT SOLN
OROMUCOSAL | Status: AC
  Administered 2020-02-11: 15 mL
  Filled 2020-02-11: qty 15

## 2020-02-11 MED ORDER — PHENYLEPHRINE HCL-NACL 10-0.9 MG/250ML-% IV SOLN
INTRAVENOUS | Status: DC | PRN
  Administered 2020-02-11: 25 ug/min via INTRAVENOUS

## 2020-02-11 MED ORDER — SUGAMMADEX SODIUM 200 MG/2ML IV SOLN
INTRAVENOUS | Status: DC | PRN
  Administered 2020-02-11: 400 mg via INTRAVENOUS

## 2020-02-11 MED ORDER — SODIUM CHLORIDE 0.9% FLUSH: 3.0000 mL | INTRAVENOUS | Status: DC | PRN

## 2020-02-11 MED ORDER — ROCURONIUM BROMIDE 10 MG/ML (PF) SYRINGE
PREFILLED_SYRINGE | INTRAVENOUS | Status: DC | PRN
  Administered 2020-02-11: 60 mg via INTRAVENOUS

## 2020-02-11 MED ORDER — APIXABAN 5 MG PO TABS
5.0000 mg | ORAL_TABLET | Freq: Two times a day (BID) | ORAL | Status: DC
  Administered 2020-02-11 – 2020-02-12 (×2): 5 mg via ORAL
  Filled 2020-02-11 (×3): qty 1

## 2020-02-11 MED ORDER — ONDANSETRON HCL 4 MG/2ML IJ SOLN: 4.0000 mg | Freq: Four times a day (QID) | INTRAMUSCULAR | Status: DC | PRN

## 2020-02-11 MED ORDER — LIDOCAINE 2% (20 MG/ML) 5 ML SYRINGE
INTRAMUSCULAR | Status: DC | PRN
  Administered 2020-02-11: 80 mg via INTRAVENOUS

## 2020-02-11 MED ORDER — CHLORHEXIDINE GLUCONATE CLOTH 2 % EX PADS
6.0000 | MEDICATED_PAD | Freq: Every day | CUTANEOUS | Status: DC
  Administered 2020-02-12: 6 via TOPICAL

## 2020-02-11 MED ORDER — ASPIRIN EC 81 MG PO TBEC
81.0000 mg | DELAYED_RELEASE_TABLET | Freq: Every day | ORAL | Status: DC
  Administered 2020-02-12: 81 mg via ORAL
  Filled 2020-02-11: qty 1

## 2020-02-11 MED ORDER — FENTANYL CITRATE (PF) 100 MCG/2ML IJ SOLN
INTRAMUSCULAR | Status: DC | PRN
  Administered 2020-02-11: 100 ug via INTRAVENOUS

## 2020-02-11 MED ORDER — CEFAZOLIN SODIUM-DEXTROSE 2-4 GM/100ML-% IV SOLN
2.0000 g | INTRAVENOUS | Status: AC
  Administered 2020-02-11: 2 g via INTRAVENOUS

## 2020-02-11 MED ORDER — CARVEDILOL 6.25 MG PO TABS: 6.2500 mg | ORAL_TABLET | Freq: Two times a day (BID) | ORAL | Status: DC

## 2020-02-11 MED ORDER — IOHEXOL 350 MG/ML SOLN
INTRAVENOUS | Status: DC | PRN
  Administered 2020-02-11: 40 mL

## 2020-02-11 MED ORDER — HEPARIN (PORCINE) IN NACL 2000-0.9 UNIT/L-% IV SOLN
INTRAVENOUS | Status: DC | PRN
  Administered 2020-02-11: 1000 mL

## 2020-02-11 MED ORDER — HEPARIN (PORCINE) IN NACL 1000-0.9 UT/500ML-% IV SOLN
INTRAVENOUS | Status: DC | PRN
  Administered 2020-02-11: 500 mL

## 2020-02-11 MED ORDER — SODIUM CHLORIDE 0.9% FLUSH
3.0000 mL | Freq: Two times a day (BID) | INTRAVENOUS | Status: DC
  Administered 2020-02-11 – 2020-02-12 (×2): 3 mL via INTRAVENOUS

## 2020-02-11 MED ORDER — MIDAZOLAM HCL 5 MG/5ML IJ SOLN
INTRAMUSCULAR | Status: DC | PRN
  Administered 2020-02-11: 2 mg via INTRAVENOUS

## 2020-02-11 SURGICAL SUPPLY — 15 items
CATH INFINITI 5FR ANG PIGTAIL (CATHETERS) ×2 IMPLANT
CLOSURE PERCLOSE PROSTYLE (VASCULAR PRODUCTS) ×4 IMPLANT
KIT HEART LEFT (KITS) ×2 IMPLANT
KIT VERSACROSS FXD (WIRE) ×2 IMPLANT
PACK CARDIAC CATHETERIZATION (CUSTOM PROCEDURE TRAY) ×2 IMPLANT
SHEATH PERFORMER 16FR 30 (SHEATH) ×2 IMPLANT
SHEATH PINNACLE 8F 10CM (SHEATH) ×2 IMPLANT
TRANSDUCER W/STOPCOCK (MISCELLANEOUS) ×2 IMPLANT
TUBING ART PRESS 72  MALE/FEM (TUBING) ×2
TUBING ART PRESS 72 MALE/FEM (TUBING) ×1 IMPLANT
WATCHMAN FLX 24 (Prosthesis & Implant Heart) ×2 IMPLANT
WATCHMAN FLX PROCEDURE DEVICE (Prosthesis & Implant Heart) ×2 IMPLANT
WATCHMAN PROCED TRUSEAL ACCESS (SHEATH) ×2 IMPLANT
WATCHMAN TRUSEAL ANTER CURVE (SHEATH) ×2 IMPLANT
WIRE AMPLATZ SS-J .035X180CM (WIRE) ×2 IMPLANT

## 2020-02-11 NOTE — Anesthesia Postprocedure Evaluation (Signed)
Anesthesia Post Note  Patient: Ronald [name]  Procedure(s) Performed: LEFT ATRIAL APPENDAGE OCCLUSION (N/A )     Patient location during evaluation: PACU Anesthesia Type: General Level of consciousness: awake and alert Pain management: pain level controlled Vital Signs Assessment: post-procedure vital signs reviewed and stable Respiratory status: spontaneous breathing, nonlabored ventilation and respiratory function stable Cardiovascular status: blood pressure returned to baseline and stable Postop Assessment: no apparent nausea or vomiting Anesthetic complications: no   No complications documented.  Last Vitals:  Vitals:   02/11/20 1645 02/11/20 1655  BP: (!) 146/79 (!) 149/72  Pulse: (!) 59 (!) 57  Resp: 16 16  Temp:    SpO2: 95% 93%    Last Pain:  Vitals:   02/11/20 1635  TempSrc: Temporal  PainSc:                  Abbie Berling,W. EDMOND

## 2020-02-11 NOTE — Progress Notes (Signed)
Rt radial arterial line d/c'ed. Manual pressure x 10 minutes. Level 0. 2+ palpable rt radial pulse. Gauze and tegaderm dressing.

## 2020-02-11 NOTE — Plan of Care (Signed)
Initiating Care Plan   Problem: Education: Goal: Knowledge of General Education information will improve Description: Including pain rating scale, medication(s)/side effects and non-pharmacologic comfort measures Outcome: Progressing   Problem: Health Behavior/Discharge Planning: Goal: Ability to manage health-related needs will improve Outcome: Progressing   Problem: Clinical Measurements: Goal: Ability to maintain clinical measurements within normal limits will improve Outcome: Progressing Goal: Will remain free from infection Outcome: Progressing Goal: Diagnostic test results will improve Outcome: Progressing Goal: Respiratory complications will improve Outcome: Progressing Goal: Cardiovascular complication will be avoided Outcome: Progressing   Problem: Activity: Goal: Risk for activity intolerance will decrease Outcome: Progressing   Problem: Nutrition: Goal: Adequate nutrition will be maintained Outcome: Progressing   Problem: Coping: Goal: Level of anxiety will decrease Outcome: Progressing   Problem: Elimination: Goal: Will not experience complications related to bowel motility Outcome: Progressing Goal: Will not experience complications related to urinary retention Outcome: Progressing   Problem: Safety: Goal: Ability to remain free from injury will improve Outcome: Progressing   Problem: Skin Integrity: Goal: Risk for impaired skin integrity will decrease Outcome: Progressing   Problem: Education: Goal: Will demonstrate proper wound care and an understanding of methods to prevent future damage Outcome: Progressing Goal: Knowledge of disease or condition will improve Outcome: Progressing Goal: Knowledge of the prescribed therapeutic regimen will improve Outcome: Progressing Goal: Individualized Educational Video(s) Outcome: Progressing   Problem: Activity: Goal: Risk for activity intolerance will decrease Outcome: Progressing   Problem:  Respiratory: Goal: Respiratory status will improve Outcome: Progressing   Problem: Education: Goal: Required Educational Video(s) Outcome: Progressing   Problem: Clinical Measurements: Goal: Postoperative complications will be avoided or minimized Outcome: Progressing   Problem: Skin Integrity: Goal: Demonstration of wound healing without infection will improve Outcome: Progressing

## 2020-02-11 NOTE — Anesthesia Preprocedure Evaluation (Addendum)
Anesthesia Evaluation  Patient identified by MRN, date of birth, ID band Patient awake    Reviewed: Allergy & Precautions, H&P , NPO status , Patient's Chart, lab work & pertinent test results  Airway Mallampati: III  TM Distance: >3 FB Neck ROM: Full    Dental no notable dental hx. (+) Teeth Intact, Dental Advisory Given   Pulmonary sleep apnea , COPD, Current Smoker and Patient abstained from smoking.,    Pulmonary exam normal breath sounds clear to auscultation       Cardiovascular hypertension, Pt. on medications and Pt. on home beta blockers +CHF  + dysrhythmias Atrial Fibrillation  Rhythm:Irregular Rate:Normal     Neuro/Psych Seizures -,  CVA negative psych ROS   GI/Hepatic negative GI ROS, Neg liver ROS,   Endo/Other  diabetes, Type 2, Oral Hypoglycemic Agents  Renal/GU negative Renal ROS  negative genitourinary   Musculoskeletal   Abdominal   Peds  Hematology negative hematology ROS (+)   Anesthesia Other Findings   Reproductive/Obstetrics negative OB ROS                           Anesthesia Physical Anesthesia Plan  ASA: III  Anesthesia Plan: General   Post-op Pain Management:    Induction: Intravenous  PONV Risk Score and Plan: 2 and Ondansetron and Dexamethasone  Airway Management Planned: Oral ETT  Additional Equipment: Arterial line  Intra-op Plan:   Post-operative Plan: Extubation in OR  Informed Consent: I have reviewed the patients History and Physical, chart, labs and discussed the procedure including the risks, benefits and alternatives for the proposed anesthesia with the patient or authorized representative who has indicated his/her understanding and acceptance.     Dental advisory given  Plan Discussed with: CRNA  Anesthesia Plan Comments:         Anesthesia Quick Evaluation

## 2020-02-11 NOTE — Interval H&P Note (Signed)
History and Physical Interval Note:  02/11/2020 2:02 PM  Ronald [name]  has presented today for surgery, with the diagnosis of Atrial fibrillation.  The various methods of treatment have been discussed with the patient and family. After consideration of risks, benefits and other options for treatment, the patient has consented to  Procedure(s): LEFT ATRIAL APPENDAGE OCCLUSION (N/A) TRANSESOPHAGEAL ECHOCARDIOGRAM (TEE) (N/A) as a surgical intervention.  The patient's history has been reviewed, patient examined, no change in status, stable for surgery.  I have reviewed the patient's chart and labs.  Questions were answered to the patient's satisfaction.     Laberta Wilbon T Anaira Seay

## 2020-02-11 NOTE — Transfer of Care (Signed)
Immediate Anesthesia Transfer of Care Note  Patient: Ronald [name]  Procedure(s) Performed: LEFT ATRIAL APPENDAGE OCCLUSION (N/A )  Patient Location: Cath Lab  Anesthesia Type:General  Level of Consciousness: awake and patient cooperative  Airway & Oxygen Therapy: Patient Spontanous Breathing and Patient connected to nasal cannula oxygen  Post-op Assessment: Report given to RN and Post -op Vital signs reviewed and stable  Post vital signs: Reviewed and stable  Last Vitals:  Vitals Value Taken Time  BP 174/89 02/11/20 1601  Temp    Pulse 55 02/11/20 1602  Resp 15 02/11/20 1602  SpO2 91 % 02/11/20 1602  Vitals shown include unvalidated device data.  Last Pain:  Vitals:   02/11/20 1314  TempSrc:   PainSc: 0-No pain         Complications: No complications documented.

## 2020-02-11 NOTE — Progress Notes (Signed)
Echocardiogram Echocardiogram Transesophageal has been performed as part of Watchman procedure.  Oneal Deputy Aubrie Lucien 02/11/2020, 4:34 PM

## 2020-02-11 NOTE — Anesthesia Procedure Notes (Signed)
Procedure Name: Intubation Date/Time: 02/11/2020 2:18 PM Performed by: Moshe Salisbury, CRNA Pre-anesthesia Checklist: Patient identified, Emergency Drugs available, Suction available and Patient being monitored Patient Re-evaluated:Patient Re-evaluated prior to induction Oxygen Delivery Method: Circle System Utilized Preoxygenation: Pre-oxygenation with 100% oxygen Induction Type: IV induction Ventilation: Mask ventilation without difficulty Laryngoscope Size: Mac and 4 Grade View: Grade III Tube type: Oral Tube size: 8.0 mm Number of attempts: 1 Airway Equipment and Method: Stylet Placement Confirmation: ETT inserted through vocal cords under direct vision,  positive ETCO2 and breath sounds checked- equal and bilateral Secured at: 24 cm Tube secured with: Tape Dental Injury: Teeth and Oropharynx as per pre-operative assessment

## 2020-02-11 NOTE — Progress Notes (Signed)
Pt arrived from PACU around 1717 this afternoon, following Watchman procedure today.  Pt was alert and orientated x4.  Procedure Site was with in Normal limit, Vital signs stable, Pt educated on bedrest for 4 to 6 hours following procedure - with sheath pull at 1545.  Pt's wife at bedside, and brought his CPAP in, placed order for Pt to use his own CPAP at night PRN.

## 2020-02-11 NOTE — Plan of Care (Signed)
  Problem: Education: Goal: Knowledge of General Education information will improve Description: Including pain rating scale, medication(s)/side effects and non-pharmacologic comfort measures Outcome: Progressing   Problem: Health Behavior/Discharge Planning: Goal: Ability to manage health-related needs will improve Outcome: Progressing   Problem: Clinical Measurements: Goal: Ability to maintain clinical measurements within normal limits will improve Outcome: Progressing Goal: Will remain free from infection Outcome: Progressing Goal: Diagnostic test results will improve Outcome: Progressing Goal: Respiratory complications will improve Outcome: Progressing Goal: Cardiovascular complication will be avoided Outcome: Progressing   Problem: Activity: Goal: Risk for activity intolerance will decrease Outcome: Progressing   Problem: Nutrition: Goal: Adequate nutrition will be maintained Outcome: Progressing   Problem: Coping: Goal: Level of anxiety will decrease Outcome: Progressing   Problem: Elimination: Goal: Will not experience complications related to bowel motility Outcome: Progressing Goal: Will not experience complications related to urinary retention Outcome: Progressing   Problem: Pain Managment: Goal: General experience of comfort will improve Outcome: Progressing   Problem: Safety: Goal: Ability to remain free from injury will improve Outcome: Progressing   Problem: Skin Integrity: Goal: Risk for impaired skin integrity will decrease Outcome: Progressing   Problem: Education: Goal: Will demonstrate proper wound care and an understanding of methods to prevent future damage Outcome: Progressing Goal: Knowledge of disease or condition will improve Outcome: Progressing Goal: Knowledge of the prescribed therapeutic regimen will improve Outcome: Progressing Goal: Individualized Educational Video(s) Outcome: Progressing   Problem: Activity: Goal: Risk for  activity intolerance will decrease Outcome: Progressing   Problem: Respiratory: Goal: Respiratory status will improve Outcome: Progressing   Problem: Education: Goal: Required Educational Video(s) Outcome: Progressing   Problem: Clinical Measurements: Goal: Postoperative complications will be avoided or minimized Outcome: Progressing   Problem: Skin Integrity: Goal: Demonstration of wound healing without infection will improve Outcome: Progressing

## 2020-02-11 NOTE — Op Note (Addendum)
Watchman LAAO Procedure  Surgeon: Lars Mage, MD Co-Surgeon: Sherren Mocha, MD Anesthesia: Suann Larry, MD Imager: Marry Guan, MD and Jenkins Rouge, MD  Procedure: 1) Transseptal Puncture 2) Watchman left atrial appendage occlusion 3) Perclose right femoral vein  Device Implant: 24 mm Watchman Flex Device, Lot # [id number]  Background and Indication: 74 yo man with paroxysmal atrial fibrillation, prior intracranial bleeding, felt to be a poor candidate for long term anticoagulation. He is referred for Watchman implantation.   Procedure description: US guidance is used for right femoral venous access. [place] images are stored digitally in the patient's chart. Double Preclose is performed at 10" and 2" positions using normal technique and a after progressively dilating the femoral vein over an Amplatz Superstiff wire, a 16 Fr sheath is inserted. Transseptal puncture is then performed after fully anticoagulating the patient with unfractionated heparin. A Versacross system is used with RF energy to cross the interatrial septum under fluoroscopic and TEE guidance. Please see Dr Mardene Speak detailed report for further description of transseptal puncture and Watchman Access Sheath insertion.  Once the 14 Fr access sheath is in appropriate position in the left atrial appendage, a 24 mm Watchman Flex device is inserted and deployed to obtain appropriate position and compression in the left atrial appendage. No recaptures are required. Thorough TEE imaging is performed to evaluate all PASS criteria prior to device release. Angiography is performed prior to device release. After device release, the device remains in stable position with complete occlusion of the appendage. The sheath is removed from the body and the previously deployed perclose sutures are tightened for complete hemostasis.   EBL: minimal  Pt is transferred to the recovery area following the procedure with no early apparent  complications.   Sherren Mocha 02/11/2020 10:00 PM

## 2020-02-12 ENCOUNTER — Encounter (HOSPITAL_COMMUNITY): Payer: Self-pay | Admitting: Cardiology

## 2020-02-12 ENCOUNTER — Other Ambulatory Visit: Payer: Self-pay | Admitting: Physician Assistant

## 2020-02-12 DIAGNOSIS — I480 Paroxysmal atrial fibrillation: Secondary | ICD-10-CM

## 2020-02-12 DIAGNOSIS — I4819 Other persistent atrial fibrillation: Secondary | ICD-10-CM

## 2020-02-12 DIAGNOSIS — I612 Nontraumatic intracerebral hemorrhage in hemisphere, unspecified: Secondary | ICD-10-CM

## 2020-02-12 MED ORDER — ORAL CARE MOUTH RINSE
15.0000 mL | Freq: Two times a day (BID) | OROMUCOSAL | Status: DC
  Administered 2020-02-12: 15 mL via OROMUCOSAL

## 2020-02-12 NOTE — Discharge Instructions (Signed)
WATCHMANT Procedure, Care After  Procedure MD: Dr. Lambert Watchman Clinical Coordinator: Ernest Dick, RN  This sheet gives you information about how to care for yourself after your procedure. Your health care provider may also give you more specific instructions. If you have problems or questions, contact your health care provider.  What can I expect after the procedure? After the procedure, it is common to have:  Bruising around your puncture site.  Tenderness around your puncture site.  Tiredness (fatigue).  Medication instructions . It is very important to continue to take your blood thinner as well as Aspirin after the Watchman procedure. Call your procedure doctor's office with question or concerns. . If you are on Coumadin (warfarin), you will have your INR checked the week after your procedure, with a goal INR of 2.0 - 3.0. . Please follow your medication instructions on your discharge summary. Only take the medications listed on your discharge paperwork.  Follow up . You will be seen in 1 week and 6 weeks after your procedure in the [facility] . You will have another TEE (Transesophageal Echocardiogram) approximately 6 weeks after your procedure mark to check your device . You will follow up the MD who performed your procedure 6 months after your procedure . The Watchman Clinical Coordinator will check in with you from time to time, including 1 and 2 years after your procedure.    Follow these instructions at home: Puncture site care   Follow instructions from your health care provider about how to take care of your puncture site. Make sure you: ? If present, leave stitches (sutures), skin glue, or adhesive strips in place.  ? If a large square bandage is present, this may be removed 24 hours after surgery.   Check your puncture site every day for signs of infection. Check for: ? Redness, swelling, or pain. ? Fluid or blood. If your puncture site starts  to bleed, lie down on your back, apply firm pressure to the area, and contact your health care provider. ? Warmth. ? Pus or a bad smell. Driving  Do not drive yourself home if you received sedation  Do not drive for at least 4 days after your procedure or however long your health care provider recommends. (Do not resume driving if you have previously been instructed not to drive for other health reasons.)  Do not spend greater than 1 hour at a time in a car for the first 3 days. Stop and take a break with a 5 minute walk at least every hour.   Do not drive or use heavy machinery while taking prescription pain medicine.  Activity  Avoid activities that take a lot of effort, including exercise, for at least 7 days after your procedure.  For the first 3 days, avoid sitting for longer than one hour at a time.   Avoid alcoholic beverages, signing paperwork, or participating in legal proceedings for 24 hours after receiving sedation  Do not lift anything that is heavier than 10 lb (4.5 kg) for one week.   No sexual activity for 1 week.   Return to your normal activities as told by your health care provider. Ask your health care provider what activities are safe for you. General instructions  Take over-the-counter and prescription medicines only as told by your health care provider.  Do not use any products that contain nicotine or tobacco, such as cigarettes and e-cigarettes. If you need help quitting, ask your health care provider.  You   may shower after 24 hours, but Do not take baths, swim, or use a hot tub for 1 week.   Do not drink alcohol for 24 hours after your procedure.  Keep all follow-up visits as told by your health care provider. This is important.  Dental Work: You will require antibiotics prior to any dental work, including cleanings, for 6 months after your Watchman implantation to help protect you from infection. After 6 months, antibiotics are no longer  required. Contact a health care provider if:  You have redness, mild swelling, or pain around your puncture site.  You have soreness in your throat or at your puncture site that does not improve after several days  You have fluid or blood coming from your puncture site that stops after applying firm pressure to the area.  Your puncture site feels warm to the touch.  You have pus or a bad smell coming from your puncture site.  You have a fever.  You have chest pain or discomfort that spreads to your neck, jaw, or arm.  You are sweating a lot.  You feel nauseous.  You have a fast or irregular heartbeat.  You have shortness of breath.  You are dizzy or light-headed and feel the need to lie down.  You have pain or numbness in the arm or leg closest to your puncture site. Get help right away if:  Your puncture site suddenly swells.  Your puncture site is bleeding and the bleeding does not stop after applying firm pressure to the area. These symptoms may represent a serious problem that is an emergency. Do not wait to see if the symptoms will go away. Get medical help right away. Call your local emergency services (911 in the [place] Do not drive yourself to the hospital. Summary  After the procedure, it is normal to have bruising and tenderness at the puncture site in your groin, neck, or forearm.  Check your puncture site every day for signs of infection.  Get help right away if your puncture site is bleeding and the bleeding does not stop after applying firm pressure to the area. This is a medical emergency.  This information is not intended to replace advice given to you by your health care provider. Make sure you discuss any questions you have with your health care provider.

## 2020-02-12 NOTE — Progress Notes (Signed)
At 0033 pt converted from NSR to A. Fib. EKG completed. At 0141 pt converted to A. Flutter and at 0151 pt converted back to NSR. Pt now SB rate 52 and has gotten as low as 45. Will continue to monitor.

## 2020-02-12 NOTE — Discharge Summary (Signed)
[facility]   MULTIDISCIPLINARY HEART VALVE TEAM   STRUCTURAL HEART/ELECTROPHYSIOLOGY PROCEDURE DISCHARGE SUMMARY    Patient ID: Ronald [name],  MRN: [id number], DOB/AGE: April 10, 1945 74 y.o.  Admit date: 02/11/2020 Discharge date: 02/12/2020  Primary Care Physician: [name], MD  Primary Cardiologist: Glenetta Hew, MD  Electrophysiologist: None  Primary Discharge Diagnosis:  Persistent Atrial Fibrillation Poor candidacy for long term anticoagulation due to intracranial bleeding   Secondary Discharge Diagnosis:  DMT2 Chronic combined S/D CHF CVA HTN HLD COPD  Procedures This Admission:  1. Transeptal Puncture 2. Intra-procedural TEE which showed no LAA thrombus 3. Left atrial appendage occlusive device placement on 02/11/20 by Dr. [name].  This study demonstrated: 1.Successful implantation of a WATCHMAN left atrial appendage occlusive device   2. TEE demonstrating no LAA thrombus 3. No early apparent complications.   Brief HPI: Ronald [name] is a 74 y.o. male with a history of Persistent Atrial Fibrillation. The patient was seen as an outpatient and thought to be a poor candidate for continued anticoagulation due to intracranial bleeding. Risks, benefits, and alternatives to left atrial appendage occlusive device placement device were reviewed with the patient who wished to proceed.  The patient underwent intra-procedural TEE as above.   Hospital Course:  The patient was admitted and underwent Left Atrial appendage occlusive device placement with details as outlined above.  They were monitored on telemetry overnight which demonstrated sinus with brief run of afib/flutter as well as sinus with PACs.  Groin was without complication on the day of discharge.  The patient was examined and considered to be stable for discharge.  Wound care and restrictions were reviewed with the patient.  The patient will be seen back by Adline Peals, PA in 1 week and  approximately 6 weeks with repeat TEE in approximately 45 days post procedure to ensure proper seal of the device. They will follow up with Dr. [name] in 6 months for post Watchman follow up.   CHA2DS2VASC of 6 and unadjusted Ischemic Stroke Rate (% per year) is equal to 9.7 % stroke rate/year which necessitates long term oral anticoagulation to prevent stroke.     The patient understands the importance of continuing on their Eliquis and a 81 mg ASA for 45 days. They understand that after 45 days, they will then take 81 mg ASA and Plavix for a total of 6 months post procedure. Will have him hold Metformin x 48 hours after contrast dye exposure.    Physical Exam: Vitals:   02/11/20 1900 02/11/20 2327 02/12/20 0353 02/12/20 0834  BP: (!) 161/91 (!) 146/85 136/77   Pulse: 60 67 71 63  Resp: 18 17 (!) 21 15  Temp: 98.7 F (37.1 C) 97.8 F (36.6 C) (!) 97.4 F (36.3 C)   TempSrc: Oral Oral Oral   SpO2: 96% 95% 92% 93%  Weight:      Height:        GEN- The patient is well appearing, alert and oriented x 3 today.   HEENT: normocephalic, atraumatic; sclera clear, conjunctiva pink; hearing intact; oropharynx clear; neck supple  Lungs- Clear to ausculation bilaterally, normal work of breathing.  No wheezes, rales, rhonchi Heart- Regular rate and rhythm, no murmurs, rubs or gallops  GI- soft, non-tender, non-distended, bowel sounds present  Extremities- no clubbing, cyanosis, or edema; DP/PT/radial pulses 2+ bilaterally, groin without hematoma/bruit MS- no significant deformity or atrophy Skin- warm and dry, no rash or lesion Psych- euthymic mood, full affect Neuro- strength and  sensation are intact   Labs:   Lab Results  Component Value Date   WBC 4.6 01/21/2020   HGB 14.7 01/21/2020   HCT 40.9 01/21/2020   MCV 95 01/21/2020   PLT 210 01/21/2020   No results for input(s): NA, K, CL, CO2, BUN, CREATININE, CALCIUM, PROT, BILITOT, ALKPHOS, ALT, AST, GLUCOSE in the last 168  hours.  Invalid input(s): LABALBU   Discharge Medications:  Allergies as of 02/12/2020   No Known Allergies     Medication List    STOP taking these medications   albuterol 108 (90 Base) MCG/ACT inhaler Commonly known as: Ventolin HFA   azelastine 0.1 % nasal spray Commonly known as: ASTELIN     TAKE these medications   Accu-Chek Aviva Plus test strip Generic drug: glucose blood Test 2 times daily dx e11.9   Accu-Chek Aviva Plus w/Device Kit Use blood glucose machine as instructed on your strips   accu-chek soft touch lancets Check blood sugars once per day. DX: E11.9   amiodarone 200 MG tablet Commonly known as: PACERONE Take 1 tablet (200 mg total) by mouth 2 (two) times daily.   apixaban 5 MG Tabs tablet Commonly known as: Eliquis Take 1 tablet (5 mg total) by mouth 2 (two) times daily.   ascorbic acid 500 MG tablet Commonly known as: VITAMIN C Take 500 mg by mouth daily.   aspirin 81 MG EC tablet Take 1 tablet (81 mg total) by mouth daily. Swallow whole.   atorvastatin 10 MG tablet Commonly known as: LIPITOR TAKE 1 TABLET BY MOUTH DAILY EACH EVENING What changed:   how much to take  how to take this  when to take this   budesonide-formoterol 160-4.5 MCG/ACT inhaler Commonly known as: Symbicort Inhale 2 puffs into the lungs 2 (two) times daily.   carvedilol 6.25 MG tablet Commonly known as: COREG Take 1 tablet (6.25 mg total) by mouth 2 (two) times daily with a meal.   colchicine 0.6 MG tablet TAKE 1 TABLET (0.6 MG TOTAL) BY MOUTH 2 (TWO) TIMES DAILY AS NEEDED (GOUT FLARE). What changed: when to take this   divalproex 500 MG DR tablet Commonly known as: DEPAKOTE TAKE 1 TABLET BY MOUTH EVERY 12 HOURS What changed: when to take this   fluticasone 50 MCG/ACT nasal spray Commonly known as: FLONASE Place 1 spray into both nostrils daily as needed for allergies.   furosemide 40 MG tablet Commonly known as: LASIX TAKE 1 TABLET (40 MG TOTAL)  BY MOUTH 2 (TWO) TIMES DAILY WITH BREAKFAST AND LUNCH. What changed: when to take this   losartan 100 MG tablet Commonly known as: COZAAR Take 1 tablet (100 mg total) by mouth daily.   metFORMIN 500 MG tablet Commonly known as: GLUCOPHAGE Take 500 mg by mouth every evening. Notes to patient: Hold for 48 hours after surgery. Okay to resume on 11/6 PM   montelukast 10 MG tablet Commonly known as: SINGULAIR TAKE 1 TABLET BY MOUTH EVERYDAY AT BEDTIME What changed: See the new instructions.   Myrbetriq 25 MG Tb24 tablet Generic drug: mirabegron ER Take 25 mg by mouth daily.   nicotine 14 mg/24hr patch Commonly known as: NICODERM CQ - dosed in mg/24 hours Place 1 patch (14 mg total) onto the skin daily.   nicotine 7 mg/24hr patch Commonly known as: NICODERM CQ - dosed in mg/24 hr Place 14 mg onto the skin daily.   PRESCRIPTION MEDICATION CPAP- At bedtime   Vitamin D-3 25 MCG (1000 UT) Caps  Take 1,000 Units by mouth daily.       Disposition: home    Follow-up Information    [place], [place] R, PA. Go on 02/19/2020.   Specialty: Cardiology Why: @ 9:30am  Contact information: [place] [address] [phone]               Duration of Discharge Encounter: Greater than 30 minutes including physician time.  Signed, Angelena Form, PA-C  02/12/2020 9:47 AM

## 2020-02-12 NOTE — Plan of Care (Signed)
  Problem: Education: Goal: Knowledge of General Education information will improve Description: Including pain rating scale, medication(s)/side effects and non-pharmacologic comfort measures Outcome: Adequate for Discharge   

## 2020-02-12 NOTE — Progress Notes (Signed)
Discharge instructions (including medications) discussed with and copy provided to patient/caregiver 

## 2020-02-15 ENCOUNTER — Encounter (HOSPITAL_COMMUNITY): Payer: Self-pay | Admitting: Cardiology

## 2020-02-15 ENCOUNTER — Telehealth: Payer: Self-pay | Admitting: Nurse Practitioner

## 2020-02-15 NOTE — Telephone Encounter (Signed)
Follow up call was made to Ronald [name] this morning after Watchman implant on Thursday 11/4. He reports that he is doing well and has no question or concerns at this time. We did review his upcoming appointments on 11/12 at the AF clinic and also his TEE appointment in December. He was very grateful and impressed with his experience with the Watchman team and the care he received during his hospital stay.I advised him to call me if he has any questions regarding his Watchman implant.  Ambrose Pancoast, RN

## 2020-02-16 ENCOUNTER — Encounter (HOSPITAL_COMMUNITY): Payer: Self-pay | Admitting: Cardiology

## 2020-02-17 ENCOUNTER — Encounter: Payer: Self-pay | Admitting: Family Medicine

## 2020-02-17 ENCOUNTER — Other Ambulatory Visit: Payer: Self-pay

## 2020-02-17 ENCOUNTER — Ambulatory Visit (INDEPENDENT_AMBULATORY_CARE_PROVIDER_SITE_OTHER): Payer: Medicare Other | Admitting: Family Medicine

## 2020-02-17 VITALS — BP 128/80 | HR 56 | Temp 98.6°F | Ht 71.0 in | Wt 223.6 lb

## 2020-02-17 DIAGNOSIS — E785 Hyperlipidemia, unspecified: Secondary | ICD-10-CM

## 2020-02-17 DIAGNOSIS — I480 Paroxysmal atrial fibrillation: Secondary | ICD-10-CM

## 2020-02-17 DIAGNOSIS — I10 Essential (primary) hypertension: Secondary | ICD-10-CM | POA: Diagnosis not present

## 2020-02-17 DIAGNOSIS — E1165 Type 2 diabetes mellitus with hyperglycemia: Secondary | ICD-10-CM | POA: Diagnosis not present

## 2020-02-17 NOTE — Progress Notes (Signed)
Established Patient Office Visit  Subjective:  Patient ID: Ronald [name], male    DOB: 05/24/1945  Age: 74 y.o. MRN: [id number]  CC:  Chief Complaint  Patient presents with  . Follow-up    3 month follow up , discuss meds     HPI Ronald [name] presents for 23-monthfollow-up visit.  He has chronic problems of chronic diastolic heart failure, hypertension, atrial fibrillation with chads vas score of 7, history of pulmonary embolism, obstructive sleep apnea, ongoing nicotine use, type 2 diabetes, chronic kidney disease stage III, history of intracerebral hemorrhage while on Eliquis, gout.  He had recent admission on the fourth of this month for watchman procedure.  This went without complication.  He is currently maintained on aspirin and Eliquis for 45 days following procedure and will eventually be transitioned to Plavix and aspirin.  Current medications are reviewed include amiodarone, Eliquis, aspirin, atorvastatin, carvedilol, Depakote, furosemide, losartan, Singulair.  He is unfortunately still smoking.  He was taken off glipizide over a month ago.  Last A1c was 6.3%.  He was having some hypoglycemia symptoms when taking the glipizide.  Currently takes just low-dose Metformin 500 mg once daily for his diabetes.  Not monitoring blood sugars regularly.  No significant polyuria or polydipsia.  He states overall he feels very well at this time.  Less fatigue.  He feels his balance is better in recent weeks.   Past Medical History:  Diagnosis Date  . Allergic rhinitis   . Anticoagulant long-term use    eliquis  . [place]   . COPD with emphysema ([facility]    pulmologist-  dr sHalford Chessman . History of colon polyps    tubular adenoma 2013  . History of gout    09-05-2017 last flare-up  05/ 2019 3 wks ago, feet  . History of sepsis 02/18/2017   per d/c note probable uti, acute chf, acute renal failure, hypoxia  . History of YAG laser capsulotomy of lens December 2020,  January 2021  . Hyperlipidemia   . Hyperplasia of prostate with lower urinary tract symptoms (LUTS)   . Hypertension   . OSA on CPAP    per study 08-03-2004  Severe OSA  . Persistent atrial fibrillation ([facility]    cardiologist --  dr pDorris Carnes-  post cardioversion 05-07-2014  . Prostate cancer ([facility] urologsit-  dr mAlyson Ingles-- as of 05-21-2017 per pt last PSA 11 approx.   Dx  2014--  stage T1c, Gleason 3+3=6, PSA 6.67--  Active surveillance/  04/ 2019  Stage T1b, Gleason 3+4, PSA 12.8- plan external radiation therapy    . 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 Seasonal allergies   . Sigmoid diverticulosis   . [place]  . Systolic and diastolic CHF, chronic ([facility]    cardiologist-  dr hEllyn Hack . Tinea versicolor   . Type 2 diabetes mellitus ([place]   . Wears hearing aid in both ears     Past Surgical History:  Procedure Laterality Date  . APPENDECTOMY  1967  . BACK SURGERY  2005  . CARDIOVERSION N/A 05/07/2014   Procedure: CARDIOVERSION;  Surgeon: PFay Records MD;  [facility];  Service: Cardiovascular;  Laterality: N/A;  . CARDIOVERSION N/A 09/09/2014   Procedure: CARDIOVERSION;  Surgeon: BLelon Perla MD;  [facility];  Service: Cardiovascular;  Laterality: N/A;  successfully  . CATARACT EXTRACTION W/ INTRAOCULAR LENS  IMPLANT, BILATERAL  08 and 09/  2018  . COLONOSCOPY  last one 06-07-2011  . EYE SURGERY Bilateral 2018   cataract  . GOLD SEED IMPLANT N/A 09/09/2017   Procedure: GOLD SEED IMPLANT;  Surgeon: [name], MD;  [facility];  Service: Urology;  Laterality: N/A;  . HYDROCELE EXCISION Bilateral 09/26/2015   Procedure: HYDROCELECTOMY ADULT;  Surgeon: [name], MD;  Location: [facility];  Service: Urology;  Laterality: Bilateral;  . LAMINECTOMY AND MICRODISCECTOMY LUMBAR SPINE  12-23-2003   Left L5 -- S1  . LAPAROSCOPIC  BILATERAL INGUINAL HERNIA REPAIR/  UMBILICAL HERNIA REPAIR WITH MESH/  ASPIRATION LEFT HYDROCELE  07-11-2012  . LEFT ATRIAL APPENDAGE OCCLUSION N/A 02/11/2020   Procedure: LEFT ATRIAL APPENDAGE OCCLUSION;  Surgeon: Vickie Epley, MD;  Location: [place] [facility];  Service: Cardiovascular;  Laterality: N/A;  . NM MYOVIEW LTD  05/18/2014   Low risk study. Normal perfusion: No ischemia or infarction. Mild LV dysfunction - 46% (does not correlate with echocardiographic EF of 50-55%)  . PROSTATE BIOPSY  05/15/12   Clinically both [place]  . SPACE OAR INSTILLATION N/A 09/09/2017   Procedure: SPACE OAR INSTILLATION;  Surgeon: [name], MD;  Location[facility];  Service: Urology;  Laterality: N/A;  . TEE WITHOUT CARDIOVERSION N/A 05/07/2014   Procedure: TRANSESOPHAGEAL ECHOCARDIOGRAM (TEE);  Surgeon: [name], MD;  [facility];  Service: Cardiovascular;  Laterality: N/A;   mild atherosclerosis plaque of aorta,  mild AR, MR, and TR,  no cardiac source of emboli  . TONSILLECTOMY  as child  . TRANSTHORACIC ECHOCARDIOGRAM  11/'18; 1/'19   a) In setting of sepsis: EF of 40-45%.  Diffuse hypokinesis.  No RWMA.  Biatrial enlargement.;; b) ** f/u Jan 2019: Normal LVF 55-60%** , mildly dilated aortic root(38 mm) and ascending aorta (44 mm). Compared to prior echo, LVEF has improved.  . TRANSTHORACIC ECHOCARDIOGRAM  7/'20; 9/'20   a) Hemorrhagic CVA/SAH - EF> 65%.  Moderate concentric LVH.-GR 1 DD mild RA dilation.; b) EF 60 to 65%.  Mild LVH.  GRII DD.  Normal RV size and function.  Mild LA dilation.  Normal RA size.  Unable to assess PA pressures.  No significant aortic sclerosis.  . TRANSURETHRAL RESECTION OF PROSTATE N/A 05/30/2017   Procedure: TRANSURETHRAL RESECTION OF THE PROSTATE (TURP);  Surgeon: [name], MD;  Location: [facility];  Service: Urology;  Laterality: N/A;    Family History  Problem Relation Age of Onset  . Breast cancer Mother   . Stroke  Mother   . Stroke Other        Unknown   . Ovarian cancer Sister   . Colon cancer Neg Hx   . Esophageal cancer Neg Hx   . Rectal cancer Neg Hx   . Stomach cancer Neg Hx     Social History   Socioeconomic History  . Marital status: Married    Spouse name: Not on file  . Number of children: 2  . Years of education: 16  . Highest education level: Bachelor's degree (e.g., BA, AB, BS)  Occupational History  . Occupation: Scientist, clinical (histocompatibility and immunogenetics): [employer]  Tobacco Use  . Smoking status: Current Some Day Smoker    Packs/day: 1.00    Years: 60.00    Pack years: 60.00    Types: Cigarettes  . Smokeless tobacco: Never Used  . Tobacco comment: half pack daily  Vaping Use  . Vaping  Use: Every day  Substance and Sexual Activity  . Alcohol use: Not Currently  . Drug use: No  . Sexual activity: Not on file  Other Topics Concern  . Not on file  Social History Narrative   Married 25 years   2 children but not with this wife   Left handed    Likes to read, netflix, tv   Social Determinants of Health   Financial Resource Strain: Low Risk   . Difficulty of Paying Living Expenses: Not hard at all  Food Insecurity: No Food Insecurity  . Worried About Charity fundraiser in the Last Year: Never true  . Ran Out of Food in the Last Year: Never true  Transportation Needs: No Transportation Needs  . Lack of Transportation (Medical): No  . Lack of Transportation (Non-Medical): No  Physical Activity: Inactive  . Days of Exercise per Week: 0 days  . Minutes of Exercise per Session: 0 min  Stress: No Stress Concern Present  . Feeling of Stress : Only a little  Social Connections: Moderately Isolated  . Frequency of Communication with Friends and Family: More than three times a week  . Frequency of Social Gatherings with Friends and Family: Once a week  . Attends Religious Services: Never  . Active Member of Clubs or Organizations: No  . Attends Archivist Meetings: Never  .  Marital Status: Married  Human resources officer Violence:   . Fear of Current or Ex-Partner: Not on file  . Emotionally Abused: Not on file  . Physically Abused: Not on file  . Sexually Abused: Not on file    Outpatient Medications Prior to Visit  Medication Sig Dispense Refill  . amiodarone (PACERONE) 200 MG tablet Take 1 tablet (200 mg total) by mouth 2 (two) times daily. 180 tablet 3  . apixaban (ELIQUIS) 5 MG TABS tablet Take 1 tablet (5 mg total) by mouth 2 (two) times daily. 60 tablet 11  . ascorbic acid (VITAMIN C) 500 MG tablet Take 500 mg by mouth daily.    Marland Kitchen aspirin EC 81 MG EC tablet Take 1 tablet (81 mg total) by mouth daily. Swallow whole. 30 tablet 11  . atorvastatin (LIPITOR) 10 MG tablet TAKE 1 TABLET BY MOUTH DAILY EACH EVENING (Patient taking differently: Take 10 mg by mouth daily. TAKE 1 TABLET BY MOUTH DAILY EACH EVENING) 90 tablet 3  . Blood Glucose Monitoring Suppl (ACCU-CHEK AVIVA PLUS) w/Device KIT Use blood glucose machine as instructed on your strips 1 kit 1  . budesonide-formoterol (SYMBICORT) 160-4.5 MCG/ACT inhaler Inhale 2 puffs into the lungs 2 (two) times daily. 1 Inhaler 12  . carvedilol (COREG) 6.25 MG tablet Take 1 tablet (6.25 mg total) by mouth 2 (two) times daily with a meal. 180 tablet 3  . Cholecalciferol (VITAMIN D-3) 25 MCG (1000 UT) CAPS Take 1,000 Units by mouth daily.     . colchicine 0.6 MG tablet TAKE 1 TABLET (0.6 MG TOTAL) BY MOUTH 2 (TWO) TIMES DAILY AS NEEDED (GOUT FLARE). (Patient taking differently: Take 0.6 mg by mouth daily. ) 180 tablet 0  . divalproex (DEPAKOTE) 500 MG DR tablet TAKE 1 TABLET BY MOUTH EVERY 12 HOURS (Patient taking differently: Take 500 mg by mouth 2 (two) times daily. ) 180 tablet 1  . fluticasone (FLONASE) 50 MCG/ACT nasal spray Place 1 spray into both nostrils daily as needed for allergies.     . furosemide (LASIX) 40 MG tablet TAKE 1 TABLET (40 MG TOTAL) BY MOUTH  2 (TWO) TIMES DAILY WITH BREAKFAST AND LUNCH. (Patient taking  differently: Take 40 mg by mouth daily. ) 180 tablet 1  . glucose blood (ACCU-CHEK AVIVA PLUS) test strip Test 2 times daily dx e11.9 100 each 3  . Lancets (ACCU-CHEK SOFT TOUCH) lancets Check blood sugars once per day. DX: E11.9 100 each 2  . losartan (COZAAR) 100 MG tablet Take 1 tablet (100 mg total) by mouth daily. 90 tablet 3  . metFORMIN (GLUCOPHAGE) 500 MG tablet Take 500 mg by mouth every evening.    . montelukast (SINGULAIR) 10 MG tablet TAKE 1 TABLET BY MOUTH EVERYDAY AT BEDTIME (Patient taking differently: Take 10 mg by mouth at bedtime. ) 90 tablet 1  . MYRBETRIQ 25 MG TB24 tablet Take 25 mg by mouth daily.    . nicotine (NICODERM CQ - DOSED IN MG/24 HOURS) 14 mg/24hr patch Place 1 patch (14 mg total) onto the skin daily. 28 patch 0  . nicotine (NICODERM CQ - DOSED IN MG/24 HR) 7 mg/24hr patch Place 14 mg onto the skin daily.     Marland Kitchen PRESCRIPTION MEDICATION CPAP- At bedtime     No facility-administered medications prior to visit.    No Known Allergies  ROS Review of Systems  Constitutional: Negative for fatigue.  Eyes: Negative for visual disturbance.  Respiratory: Negative for cough, chest tightness and shortness of breath.   Cardiovascular: Negative for chest pain, palpitations and leg swelling.  Endocrine: Negative for polydipsia and polyuria.  Neurological: Negative for dizziness, syncope, weakness, light-headedness and headaches.      Objective:    Physical Exam Constitutional:      Appearance: He is well-developed.  HENT:     Right Ear: External ear normal.     Left Ear: External ear normal.  Eyes:     Pupils: Pupils are equal, round, and reactive to light.  Neck:     Thyroid: No thyromegaly.  Cardiovascular:     Rate and Rhythm: Normal rate and regular rhythm.  Pulmonary:     Effort: Pulmonary effort is normal. No respiratory distress.     Breath sounds: Normal breath sounds. No wheezing or rales.  Musculoskeletal:     Cervical back: Neck supple.      Right lower leg: No edema.     Left lower leg: No edema.  Neurological:     Mental Status: He is alert and oriented to person, place, and time.     BP 128/80 (BP Location: Left Arm, Patient Position: Sitting, Cuff Size: Large)   Pulse (!) 56   Temp 98.6 F (37 C) (Oral)   Ht '5\' 11"'  (1.803 m)   Wt 223 lb 9.6 oz (101.4 kg)   SpO2 97%   BMI 31.19 kg/m  Wt Readings from Last 3 Encounters:  02/17/20 223 lb 9.6 oz (101.4 kg)  02/11/20 225 lb (102.1 kg)  01/21/20 225 lb (102.1 kg)     Health Maintenance Due  Topic Date Due  . Hepatitis C Screening  Never done  . COVID-19 Vaccine (1) Never done  . FOOT EXAM  12/05/2017  . COLONOSCOPY  09/06/2019    There are no preventive care reminders to display for this patient.  Lab Results  Component Value Date   TSH 2.879 01/06/2020   Lab Results  Component Value Date   WBC 4.6 01/21/2020   HGB 14.7 01/21/2020   HCT 40.9 01/21/2020   MCV 95 01/21/2020   PLT 210 01/21/2020   Lab Results  Component  Value Date   NA 139 01/21/2020   K 4.1 01/21/2020   CO2 26 01/21/2020   GLUCOSE 167 (H) 01/21/2020   BUN 12 01/21/2020   CREATININE 1.33 (H) 01/21/2020   BILITOT 1.2 12/22/2019   ALKPHOS 52 12/22/2019   AST 16 12/22/2019   ALT 9 12/22/2019   PROT 5.5 (L) 12/22/2019   ALBUMIN 3.3 (L) 12/22/2019   CALCIUM 8.7 01/21/2020   ANIONGAP 11 12/22/2019   GFR 54.42 (L) 08/04/2019   Lab Results  Component Value Date   CHOL 125 12/18/2019   Lab Results  Component Value Date   HDL 30 (L) 12/18/2019   Lab Results  Component Value Date   LDLCALC 76 12/18/2019   Lab Results  Component Value Date   TRIG 97 12/18/2019   Lab Results  Component Value Date   CHOLHDL 4.2 12/18/2019   Lab Results  Component Value Date   HGBA1C 6.3 (H) 12/18/2019      Assessment & Plan:   #1 type 2 diabetes.  Well-controlled by A1c 2 months ago 6.3%.  Recently taken off glipizide.  Not monitoring regularly at home.  -Continue low glycemic  diet and bring back in 2 to 3 months and recheck A1c at that time  #2 history of atrial fibrillation.  He has had history of intracerebral hemorrhage on Eliquis and had recent watchman procedure which went well overall  #3 hypertension stable  #4 ongoing nicotine use.  Strongly advised to quit smoking altogether  #5 dyslipidemia.  Goal LDL less than 70.  Last lipids were checked 12/18/2019  No orders of the defined types were placed in this encounter.   Follow-up: Return in about 3 months (around 05/19/2020).    Carolann Littler, MD

## 2020-02-18 NOTE — Progress Notes (Signed)
Primary Care Physician: Ronald Post, MD Primary Cardiologist: Ronald Ronald [name] Primary Electrophysiologist: Ronald Ronald [name] Referring Physician: Dr Ronald [name] is a 74 y.o. male with a history of hemorrhagic stroke (February 2020 while on Eliquis), PE, HTN, DM, HLD, atrial fibrillation paroxysmal/ persistent atrial fibrillation who presents for follow up in the [facility]. He was seen remotely in 2016 by Ronald [name]. He was admitted 9/9 after he had a visual change and headache. He sought care and there was a 21cc R occipital intraparenchymal hemorrhage. Patient was seen by Ronald [name] and Ronald Ronald [name] who recommended Watchman.   On follow up today, patient is s/p Watchman implant on 02/11/20. He reports he has done very well since his procedure. He is very appreciative of the care he received during his hospital stay. He denies any CP, swallowing, or groin issues. He is tolerating ASA and anticoagulation without any bleeding issues. He does bring in his Kardia strips today which show paroxysmal afib ~ 1 episode per week, overall rate controlled. Several strips show SR with frequent PACs.  Today, he denies symptoms of palpitations, chest pain, shortness of breath, orthopnea, PND, lower extremity edema, dizziness, presyncope, syncope, snoring, daytime somnolence, bleeding. The patient is tolerating medications without difficulties and is otherwise without complaint today.    Atrial Fibrillation Risk Factors:  he does have symptoms or diagnosis of sleep apnea. he does not have a history of rheumatic fever.   he has a BMI of Body mass index is 30.63 kg/m.Marland Kitchen Filed Weights   02/19/20 0929  Weight: 99.6 kg    Family History  Problem Relation Age of Onset  . Breast cancer Mother   . Stroke Mother   . Stroke Other        Unknown   . Ovarian cancer Sister   . Colon cancer Neg Hx   . Esophageal cancer Neg Hx   . Rectal cancer Neg Hx   . Stomach  cancer Neg Hx      Atrial Fibrillation Management history:  Previous antiarrhythmic drugs: amiodarone Previous cardioversions: 2016 x 2 Previous ablations: none CHADS2VASC score: 6 Anticoagulation history: Eliquis, Watchman 02/11/20   Past Medical History:  Diagnosis Date  . Allergic rhinitis   . Anticoagulant long-term use    eliquis  . Atrial fibrillation ([place])   . COPD with emphysema [facility])    pulmologist-  Ronald Halford Chessman  . History of colon polyps    tubular adenoma 2013  . History of gout    09-05-2017 last flare-up  05/ 2019 3 wks ago, feet  . History of sepsis 02/18/2017   per d/c note probable uti, acute chf, acute renal failure, hypoxia  . History of YAG laser capsulotomy of lens December 2020, January 2021  . Hyperlipidemia   . Hyperplasia of prostate with lower urinary tract symptoms (LUTS)   . Hypertension   . OSA on CPAP    per study 08-03-2004  Severe OSA  . Persistent atrial fibrillation [facility])    cardiologist --  Ronald Dorris Carnes--  [name] cardioversion 05-07-2014  . Prostate cancer [facility]) urologsit-  Ronald Alyson Ingles--- as of 05-21-2017 per pt last PSA 11 approx.   Dx  2014--  stage T1c, Gleason 3+3=6, PSA 6.67--  Active surveillance/  04/ 2019  Stage T1b, Gleason 3+4, PSA 12.8- plan external radiation therapy    . 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 ([place])    pulmologist-  Ronald Halford Chessman  . Seasonal allergies   .  Sigmoid diverticulosis   . Stroke ([place]) 2021  . Systolic and diastolic CHF, chronic [facility])    cardiologist-  Ronald Ronald [name]  . Tinea versicolor   . Type 2 diabetes mellitus ([place])   . Wears hearing aid in both ears    Past Surgical History:  Procedure Laterality Date  . APPENDECTOMY  1967  . BACK SURGERY  2005  . CARDIOVERSION N/A 05/07/2014   Procedure: CARDIOVERSION;  Surgeon: [name], MD;  Location: [facility] ENDOSCOPY;  Service: Cardiovascular;  Laterality: N/A;  . CARDIOVERSION N/A 09/09/2014   Procedure: CARDIOVERSION;  Surgeon: Lelon Perla, MD;  Location: [facility] ENDOSCOPY;  Service: Cardiovascular;  Laterality: N/A;  successfully  . CATARACT EXTRACTION W/ INTRAOCULAR LENS  IMPLANT, BILATERAL  08 and 09/  2018  . COLONOSCOPY  last one 06-07-2011  . EYE SURGERY Bilateral 2018   cataract  . GOLD SEED IMPLANT N/A 09/09/2017   Procedure: GOLD SEED IMPLANT;  Surgeon: [name], MD;  Location[facility]  Service: Urology;  Laterality: N/A;  . HYDROCELE EXCISION Bilateral 09/26/2015   Procedure: HYDROCELECTOMY ADULT;  Surgeon: [name], MD;  Location: [facility];  Service: Urology;  Laterality: Bilateral;  . LAMINECTOMY AND MICRODISCECTOMY LUMBAR SPINE  12-23-2003   Left L5 -- S1  . LAPAROSCOPIC BILATERAL INGUINAL HERNIA REPAIR/  UMBILICAL HERNIA REPAIR WITH MESH/  ASPIRATION LEFT HYDROCELE  07-11-2012  . LEFT ATRIAL APPENDAGE OCCLUSION N/A 02/11/2020   Procedure: LEFT ATRIAL APPENDAGE OCCLUSION;  Surgeon: Vickie Epley, MD;  Location: [facility];  Service: Cardiovascular;  Laterality: N/A;  . NM MYOVIEW LTD  05/18/2014   Low risk study. Normal perfusion: No ischemia or infarction. Mild LV dysfunction - 46% (does not correlate with echocardiographic EF of 50-55%)  . PROSTATE BIOPSY  05/15/12   Clinically both [place]  . SPACE OAR INSTILLATION N/A 09/09/2017   Procedure: SPACE OAR INSTILLATION;  Surgeon: [name], MD;  Location: [facility];  Service: Urology;  Laterality: N/A;  . TEE WITHOUT CARDIOVERSION N/A 05/07/2014   Procedure: TRANSESOPHAGEAL ECHOCARDIOGRAM (TEE);  Surgeon: [name], MD;  Location: [facility] ENDOSCOPY;  Service: Cardiovascular;  Laterality: N/A;   mild atherosclerosis plaque of aorta,  mild AR, MR, and TR,  no cardiac source of emboli  . TONSILLECTOMY  as child  . TRANSTHORACIC ECHOCARDIOGRAM  11/'18; 1/'19   a) In setting of sepsis: EF of 40-45%.  Diffuse hypokinesis.  No RWMA.  Biatrial enlargement.;; b) ** f/u Jan 2019: Normal  LVF 55-60%** , mildly dilated aortic root(38 mm) and ascending aorta (44 mm). Compared to prior echo, LVEF has improved.  . TRANSTHORACIC ECHOCARDIOGRAM  7/'20; 9/'20   a) Hemorrhagic CVA/SAH - EF> 65%.  Moderate concentric LVH.-GR 1 DD mild RA dilation.; b) EF 60 to 65%.  Mild LVH.  GRII DD.  Normal RV size and function.  Mild LA dilation.  Normal RA size.  Unable to assess PA pressures.  No significant aortic sclerosis.  . TRANSURETHRAL RESECTION OF PROSTATE N/A 05/30/2017   Procedure: TRANSURETHRAL RESECTION OF THE PROSTATE (TURP);  Surgeon: [name], MD;  Location: [facility];  Service: Urology;  Laterality: N/A;    Current Outpatient Medications  Medication Sig Dispense Refill  . amiodarone (PACERONE) 200 MG tablet Take 1 tablet (200 mg total) by mouth 2 (two) times daily. 180 tablet 3  . apixaban (ELIQUIS) 5 MG TABS tablet Take 1 tablet (5 mg total) by mouth 2 (  two) times daily. 60 tablet 11  . ascorbic acid (VITAMIN C) 500 MG tablet Take 500 mg by mouth daily.    Marland Kitchen aspirin EC 81 MG EC tablet Take 1 tablet (81 mg total) by mouth daily. Swallow whole. 30 tablet 11  . atorvastatin (LIPITOR) 10 MG tablet TAKE 1 TABLET BY MOUTH DAILY EACH EVENING 90 tablet 3  . Blood Glucose Monitoring Suppl (ACCU-CHEK AVIVA PLUS) w/Device KIT Use blood glucose machine as instructed on your strips 1 kit 1  . carvedilol (COREG) 6.25 MG tablet Take 1 tablet (6.25 mg total) by mouth 2 (two) times daily with a meal. 180 tablet 3  . Cholecalciferol (VITAMIN D-3) 25 MCG (1000 UT) CAPS Take 1,000 Units by mouth daily.     . colchicine 0.6 MG tablet TAKE 1 TABLET (0.6 MG TOTAL) BY MOUTH 2 (TWO) TIMES DAILY AS NEEDED (GOUT FLARE). 180 tablet 0  . divalproex (DEPAKOTE) 500 MG Ronald tablet TAKE 1 TABLET BY MOUTH EVERY 12 HOURS 180 tablet 1  . furosemide (LASIX) 40 MG tablet TAKE 1 TABLET (40 MG TOTAL) BY MOUTH 2 (TWO) TIMES DAILY WITH BREAKFAST AND LUNCH. 180 tablet 1  . glucose blood (ACCU-CHEK AVIVA PLUS) test strip  Test 2 times daily dx e11.9 100 each 3  . Lancets (ACCU-CHEK SOFT TOUCH) lancets Check blood sugars once per day. DX: E11.9 100 each 2  . losartan (COZAAR) 100 MG tablet Take 1 tablet (100 mg total) by mouth daily. 90 tablet 3  . metFORMIN (GLUCOPHAGE) 500 MG tablet Take 500 mg by mouth every evening.    . montelukast (SINGULAIR) 10 MG tablet TAKE 1 TABLET BY MOUTH EVERYDAY AT BEDTIME 90 tablet 1  . MYRBETRIQ 25 MG TB24 tablet Take 25 mg by mouth daily.    Marland Kitchen PRESCRIPTION MEDICATION CPAP- At bedtime     No current facility-administered medications for this encounter.    No Known Allergies  Social History   Socioeconomic History  . Marital status: Married    Spouse name: Not on file  . Number of children: 2  . Years of education: 16  . Highest education level: Bachelor's degree (e.g., BA, AB, BS)  Occupational History  . Occupation: Scientist, clinical (histocompatibility and immunogenetics): [employer]  Tobacco Use  . Smoking status: Current Some Day Smoker    Packs/day: 1.00    Years: 60.00    Pack years: 60.00    Types: Cigarettes  . Smokeless tobacco: Never Used  . Tobacco comment: pack daily  Vaping Use  . Vaping Use: Every day  Substance and Sexual Activity  . Alcohol use: Not Currently  . Drug use: No  . Sexual activity: Not on file  Other Topics Concern  . Not on file  Social History Narrative   Married 25 years   2 children but not with this wife   Left handed    Likes to read, netflix, tv   Social Determinants of Health   Financial Resource Strain: Low Risk   . Difficulty of Paying Living Expenses: Not hard at all  Food Insecurity: No Food Insecurity  . Worried About Charity fundraiser in the Last Year: Never true  . Ran Out of Food in the Last Year: Never true  Transportation Needs: No Transportation Needs  . Lack of Transportation (Medical): No  . Lack of Transportation (Non-Medical): No  Physical Activity: Inactive  . Days of Exercise per Week: 0 days  . Minutes of Exercise per  Session: 0 min  Stress: No Stress Concern Present  . Feeling of Stress : Only a little  Social Connections: Moderately Isolated  . Frequency of Communication with Friends and Family: More than three times a week  . Frequency of Social Gatherings with Friends and Family: Once a week  . Attends Religious Services: Never  . Active Member of Clubs or Organizations: No  . Attends Archivist Meetings: Never  . Marital Status: Married  Human resources officer Violence:   . Fear of Current or Ex-Partner: Not on file  . Emotionally Abused: Not on file  . Physically Abused: Not on file  . Sexually Abused: Not on file     ROS- All systems are reviewed and negative except as per the HPI above.  Physical Exam: Vitals:   02/19/20 0929  BP: 140/88  Pulse: 68  Weight: 99.6 kg  Height: '5\' 11"'  (1.803 m)    GEN- The patient is well appearing obese elderly male, alert and oriented x 3 today.   HEENT-head normocephalic, atraumatic, sclera clear, conjunctiva pink, hearing intact, trachea midline. Lungs- Clear to ausculation bilaterally, normal work of breathing Heart- Regular rate and rhythm, occasional ectopic beat, no murmurs, rubs or gallops  GI- soft, NT, ND, + BS Extremities- no clubbing, cyanosis, or edema MS- no significant deformity or atrophy Skin- no rash or lesion Psych- euthymic mood, full affect Neuro- strength and sensation are intact   Wt Readings from Last 3 Encounters:  02/19/20 99.6 kg  02/17/20 101.4 kg  02/11/20 102.1 kg    EKG today demonstrates SR HR 68, PACs, NST, PR 166, QRS 88, QTc 469  Echo 12/18/19 demonstrated  1. Left ventricular ejection fraction, by estimation, is 60 to 65%. The  left ventricle has normal function. The left ventricle has no regional  wall motion abnormalities. There is severe asymmetric left ventricular  hypertrophy. Left ventricular diastolic  parameters are indeterminate.  2. Right ventricular systolic function is normal. The  right ventricular  size is normal.  3. The mitral valve is normal in structure. No evidence of mitral valve  regurgitation.  4. The aortic valve is normal in structure. Aortic valve regurgitation is  not visualized. No aortic stenosis is present.  5. Aortic dilatation noted. There is moderate dilatation of the ascending  aorta, measuring 40 mm.   Epic records are reviewed at length today  CHA2DS2-VASc Score = 6  The patient's score is based upon: CHF History: 1 HTN History: 1 Diabetes History: 1 Stroke History: 2 Vascular Disease History: 0      ASSESSMENT AND PLAN: 1. Persistent Atrial Fibrillation (ICD10:  I48.19) S/p Watchman implant with Ronald Ronald [name] 02/11/20 No apparent early complications.  Continue amiodarone 200 mg BID for now. ? If he would be ablation candidate. Continue Coreg 6.25 mg BID Continue ASA 81 mg daily Continue Eliquis 5 mg BID Patient scheduled for 45 day TEE on 03/30/20. Instructions given.  2. Secondary Hypercoagulable State (ICD10:  D68.69) The patient is at significant risk for stroke/thromboembolism based upon his CHA2DS2-VASc Score of 6.  However, the patient is not on anticoagulation due to his high bleeding risk. S/p Watchman implant 02/11/20. Currently on Eliquis and ASA.  SBE prophylaxis will be required for any dental procedures, including cleanings, for 6 months [name] Watchman implant.  3. Obesity Body mass index is 30.63 kg/m. Lifestyle modification was discussed and encouraged including regular physical activity and weight reduction.  4. Combined chronic systolic/diastolic CHF No signs or symptoms of fluid overload.  5. HTN  Stable, no changes today.  6. OSA Encouraged compliance with CPAP therapy.   Follow up with Ronald Ronald [name] [name] TEE (will reschedule to be after procedure).   [facility] [address] [place], [place] [address] [phone] 02/19/2020 9:36 AM

## 2020-02-19 ENCOUNTER — Other Ambulatory Visit: Payer: Self-pay | Admitting: Family Medicine

## 2020-02-19 ENCOUNTER — Other Ambulatory Visit: Payer: Self-pay

## 2020-02-19 ENCOUNTER — Ambulatory Visit (HOSPITAL_COMMUNITY)
Admit: 2020-02-19 | Discharge: 2020-02-19 | Disposition: A | Discharge status: Home / Self Care | Payer: Medicare Other | Attending: Physician Assistant | Admitting: Physician Assistant

## 2020-02-19 ENCOUNTER — Other Ambulatory Visit (HOSPITAL_COMMUNITY): Payer: Self-pay | Admitting: *Deleted

## 2020-02-19 ENCOUNTER — Encounter (HOSPITAL_COMMUNITY): Payer: Self-pay | Admitting: Physician Assistant

## 2020-02-19 VITALS — BP 140/88 | HR 68 | Ht 71.0 in | Wt 219.6 lb

## 2020-02-19 DIAGNOSIS — Z6830 Body mass index (BMI) 30.0-30.9, adult: Secondary | ICD-10-CM | POA: Diagnosis not present

## 2020-02-19 DIAGNOSIS — D6869 Other thrombophilia: Secondary | ICD-10-CM | POA: Insufficient documentation

## 2020-02-19 DIAGNOSIS — I4819 Other persistent atrial fibrillation: Secondary | ICD-10-CM

## 2020-02-19 DIAGNOSIS — Z7982 Long term (current) use of aspirin: Secondary | ICD-10-CM | POA: Insufficient documentation

## 2020-02-19 DIAGNOSIS — I4891 Unspecified atrial fibrillation: Secondary | ICD-10-CM

## 2020-02-19 DIAGNOSIS — G4733 Obstructive sleep apnea (adult) (pediatric): Secondary | ICD-10-CM | POA: Insufficient documentation

## 2020-02-19 DIAGNOSIS — I5042 Chronic combined systolic (congestive) and diastolic (congestive) heart failure: Secondary | ICD-10-CM | POA: Diagnosis not present

## 2020-02-19 DIAGNOSIS — E669 Obesity, unspecified: Secondary | ICD-10-CM | POA: Diagnosis not present

## 2020-02-19 DIAGNOSIS — Z7901 Long term (current) use of anticoagulants: Secondary | ICD-10-CM | POA: Insufficient documentation

## 2020-02-19 DIAGNOSIS — I110 Hypertensive heart disease with heart failure: Secondary | ICD-10-CM | POA: Insufficient documentation

## 2020-02-19 NOTE — Patient Instructions (Addendum)
TEE scheduled for Wednesday, December 22nd  - Arrive at the [facility] and go to admitting at [facility] not eat or drink anything after midnight the night prior to your procedure.  - Take all your morning medication (except diabetic medications) with a sip of water prior to arrival.  - You will not be able to drive home after your procedure.

## 2020-02-20 ENCOUNTER — Other Ambulatory Visit: Payer: Self-pay | Admitting: Family Medicine

## 2020-02-24 DIAGNOSIS — H547 Unspecified visual loss: Secondary | ICD-10-CM | POA: Diagnosis not present

## 2020-02-24 DIAGNOSIS — I69351 Hemiplegia and hemiparesis following cerebral infarction affecting right dominant side: Secondary | ICD-10-CM | POA: Diagnosis not present

## 2020-02-24 DIAGNOSIS — M6281 Muscle weakness (generalized): Secondary | ICD-10-CM | POA: Diagnosis not present

## 2020-02-24 DIAGNOSIS — I69198 Other sequelae of nontraumatic intracerebral hemorrhage: Secondary | ICD-10-CM | POA: Diagnosis not present

## 2020-02-24 DIAGNOSIS — G4089 Other seizures: Secondary | ICD-10-CM | POA: Diagnosis not present

## 2020-02-24 DIAGNOSIS — I69398 Other sequelae of cerebral infarction: Secondary | ICD-10-CM | POA: Diagnosis not present

## 2020-03-02 ENCOUNTER — Encounter: Payer: Self-pay | Admitting: Neurology

## 2020-03-02 ENCOUNTER — Ambulatory Visit (INDEPENDENT_AMBULATORY_CARE_PROVIDER_SITE_OTHER): Payer: Medicare Other | Admitting: Neurology

## 2020-03-02 ENCOUNTER — Other Ambulatory Visit: Payer: Self-pay

## 2020-03-02 VITALS — BP 158/89 | HR 69 | Ht 70.0 in | Wt 225.0 lb

## 2020-03-02 DIAGNOSIS — R569 Unspecified convulsions: Secondary | ICD-10-CM | POA: Diagnosis not present

## 2020-03-02 DIAGNOSIS — I612 Nontraumatic intracerebral hemorrhage in hemisphere, unspecified: Secondary | ICD-10-CM | POA: Diagnosis not present

## 2020-03-02 NOTE — Progress Notes (Signed)
Reason for visit: Intracranial hemorrhage, seizures  Referring physician: [facility]  Ronald [name] is a 74 y.o. male  History of present illness:  Ronald [name] is a 75 year old left-handed white male with a history of a prior right frontal intracranial hemorrhage in the summer 2020. The patient had seizures around this time was placed on Depakote. He has not had any seizures since that time and has done fairly well. Unfortunately, he was admitted to the hospital on 17 December 2019 with a right occipital intracranial hemorrhage. The patient was noted by MRI to have multiple microhemorrhages that may be consistent with cerebral amyloid angiopathy. The patient has been anticoagulated with a history of atrial fibrillation. The decision was made to have a Watchman device placed, this was done on 11 February 2020. The patient is on Eliquis currently, but he will undergo another transesophageal echocardiogram in December, and may be converted to Plavix for 6 months after that. The patient has had some residual visual field changes with severe impairment of the left inferior visual quadrant, he feels somewhat weak in the legs, he may stumble or fall on occasion. He just recently completed physical and occupational therapy. He did have inpatient rehab prior to that. He denies any headaches or any numbness of the extremities. The patient comes to the office today for an evaluation.  Past Medical History:  Diagnosis Date  . Allergic rhinitis   . Anticoagulant long-term use    eliquis  . Atrial fibrillation ([place])   . COPD with emphysema [facility])    pulmologist-  dr Halford Chessman  . History of colon polyps    tubular adenoma 2013  . History of gout    09-05-2017 last flare-up  05/ 2019 3 wks ago, feet  . History of sepsis 02/18/2017   per d/c note probable uti, acute chf, acute renal failure, hypoxia  . History of YAG laser capsulotomy of lens December 2020, January 2021  . Hyperlipidemia   .  Hyperplasia of prostate with lower urinary tract symptoms (LUTS)   . Hypertension   . OSA on CPAP    per study 08-03-2004  Severe OSA  . Persistent atrial fibrillation [facility])    cardiologist --  dr Dorris Carnes--  post cardioversion 05-07-2014  . Prostate cancer [facility]) urologsit-  dr Alyson Ingles--- as of 05-21-2017 per pt last PSA 11 approx.   Dx  2014--  stage T1c, Gleason 3+3=6, PSA 6.67--  Active surveillance/  04/ 2019  Stage T1b, Gleason 3+4, PSA 12.8- plan external radiation therapy    . 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 ([place])    pulmologist-  dr Halford Chessman  . Seasonal allergies   . Sigmoid diverticulosis   . Stroke ([place]) 09/09/021  . Systolic and diastolic CHF, chronic [facility])    cardiologist-  [name]  . Tinea versicolor   . Type 2 diabetes mellitus ([place])   . Wears hearing aid in both ears     Past Surgical History:  Procedure Laterality Date  . APPENDECTOMY  1967  . BACK SURGERY  2005  . CARDIOVERSION N/A 05/07/2014   Procedure: CARDIOVERSION;  Surgeon: [name], MD;  Location[facility] ENDOSCOPY;  Service: Cardiovascular;  Laterality: N/A;  . CARDIOVERSION N/A 09/09/2014   Procedure: CARDIOVERSION;  Surgeon: Lelon Perla, MD;  Location: [facility] ENDOSCOPY;  Service: Cardiovascular;  Laterality: N/A;  successfully  . CATARACT EXTRACTION W/ INTRAOCULAR LENS  IMPLANT, BILATERAL  08 and 09/  2018  . COLONOSCOPY  last one  06-07-2011  . EYE SURGERY Bilateral 2018   cataract  . GOLD SEED IMPLANT N/A 09/09/2017   Procedure: GOLD SEED IMPLANT;  Surgeon: [name], MD;  Location: [facility];  Service: Urology;  Laterality: N/A;  . HYDROCELE EXCISION Bilateral 09/26/2015   Procedure: HYDROCELECTOMY ADULT;  Surgeon: [name], MD;  Location: [facility];  Service: Urology;  Laterality: Bilateral;  . LAMINECTOMY AND MICRODISCECTOMY LUMBAR SPINE  12-23-2003   Left L5 -- S1  . LAPAROSCOPIC BILATERAL INGUINAL HERNIA REPAIR/   UMBILICAL HERNIA REPAIR WITH MESH/  ASPIRATION LEFT HYDROCELE  07-11-2012  . LEFT ATRIAL APPENDAGE OCCLUSION N/A 02/11/2020   Procedure: LEFT ATRIAL APPENDAGE OCCLUSION;  Surgeon: Vickie Epley, MD;  Location: [facility] [facility];  Service: Cardiovascular;  Laterality: N/A;  . LEFT ATRIAL APPENDAGE OCCLUSION    . NM MYOVIEW LTD  05/18/2014   Low risk study. Normal perfusion: No ischemia or infarction. Mild LV dysfunction - 46% (does not correlate with echocardiographic EF of 50-55%)  . PROSTATE BIOPSY  05/15/12   Clinically both Lobes  . SPACE OAR INSTILLATION N/A 09/09/2017   Procedure: SPACE OAR INSTILLATION;  Surgeon: [name], MD;  Location: [facility];  Service: Urology;  Laterality: N/A;  . TEE WITHOUT CARDIOVERSION N/A 05/07/2014   Procedure: TRANSESOPHAGEAL ECHOCARDIOGRAM (TEE);  Surgeon: [name], MD;  Location: [facility] ENDOSCOPY;  Service: Cardiovascular;  Laterality: N/A;   mild atherosclerosis plaque of aorta,  mild AR, MR, and TR,  no cardiac source of emboli  . TONSILLECTOMY  as child  . TRANSTHORACIC ECHOCARDIOGRAM  11/'18; 1/'19   a) In setting of sepsis: EF of 40-45%.  Diffuse hypokinesis.  No RWMA.  Biatrial enlargement.;; b) ** f/u Jan 2019: Normal LVF 55-60%** , mildly dilated aortic root(38 mm) and ascending aorta (44 mm). Compared to prior echo, LVEF has improved.  . TRANSTHORACIC ECHOCARDIOGRAM  7/'20; 9/'20   a) Hemorrhagic CVA/SAH - EF> 65%.  Moderate concentric LVH.-GR 1 DD mild RA dilation.; b) EF 60 to 65%.  Mild LVH.  GRII DD.  Normal RV size and function.  Mild LA dilation.  Normal RA size.  Unable to assess PA pressures.  No significant aortic sclerosis.  . TRANSURETHRAL RESECTION OF PROSTATE N/A 05/30/2017   Procedure: TRANSURETHRAL RESECTION OF THE PROSTATE (TURP);  Surgeon: [name], MD;  Location: [facility] ORS;  Service: Urology;  Laterality: N/A;    Family History  Problem Relation Age of Onset  . Breast cancer Mother   . Stroke  Mother   . Stroke Other        Unknown   . Ovarian cancer Sister   . Colon cancer Neg Hx   . Esophageal cancer Neg Hx   . Rectal cancer Neg Hx   . Stomach cancer Neg Hx     Social history:  reports that he has been smoking cigarettes. He has a 60.00 pack-year smoking history. He has never used smokeless tobacco. He reports previous alcohol use. He reports that he does not use drugs.  Medications:  Prior to Admission medications   Medication Sig Start Date End Date Taking? Authorizing Provider  amiodarone (PACERONE) 200 MG tablet Take 1 tablet (200 mg total) by mouth 2 (two) times daily. 01/21/20  Yes Vickie Epley, MD  apixaban (ELIQUIS) 5 MG TABS tablet Take 1 tablet (5 mg total) by mouth 2 (two) times daily. 01/21/20  Yes Vickie Epley, MD  ascorbic acid (VITAMIN  C) 500 MG tablet Take 500 mg by mouth daily.   Yes [provider]  aspirin EC 81 MG EC tablet Take 1 tablet (81 mg total) by mouth daily. Swallow whole. 12/23/19  Yes Donzetta Starch, NP  atorvastatin (LIPITOR) 10 MG tablet TAKE 1 TABLET BY MOUTH DAILY EACH EVENING 01/16/19  Yes Burchette, Alinda Sierras, MD  Blood Glucose Monitoring Suppl (ACCU-CHEK AVIVA PLUS) w/Device KIT Use blood glucose machine as instructed on your strips 12/12/18  Yes Angiulli, Lavon Paganini, PA-C  carvedilol (COREG) 6.25 MG tablet Take 1 tablet (6.25 mg total) by mouth 2 (two) times daily with a meal. 11/13/19  Yes Burchette, Alinda Sierras, MD  Cholecalciferol (VITAMIN D-3) 25 MCG (1000 UT) CAPS Take 1,000 Units by mouth daily.    Yes [provider]  colchicine 0.6 MG tablet TAKE 1 TABLET (0.6 MG TOTAL) BY MOUTH 2 (TWO) TIMES DAILY AS NEEDED (GOUT FLARE). 02/22/20  Yes Burchette, Alinda Sierras, MD  divalproex (DEPAKOTE) 500 MG DR tablet TAKE 1 TABLET BY MOUTH EVERY 12 HOURS 11/30/19  Yes Dohmeier, Asencion Partridge, MD  furosemide (LASIX) 40 MG tablet TAKE 1 TABLET (40 MG TOTAL) BY MOUTH 2 (TWO) TIMES DAILY WITH BREAKFAST AND LUNCH. 02/22/20  Yes Burchette, Alinda Sierras,  MD  glucose blood (ACCU-CHEK AVIVA PLUS) test strip Test 2 times daily dx e11.9 12/12/18  Yes Angiulli, Lavon Paganini, PA-C  Lancets (ACCU-CHEK SOFT TOUCH) lancets Check blood sugars once per day. DX: E11.9 12/12/18  Yes Angiulli, Lavon Paganini, PA-C  losartan (COZAAR) 100 MG tablet Take 1 tablet (100 mg total) by mouth daily. 01/21/20 04/20/20 Yes Vickie Epley, MD  metFORMIN (GLUCOPHAGE) 500 MG tablet Take 500 mg by mouth every evening.   Yes [provider]  montelukast (SINGULAIR) 10 MG tablet TAKE 1 TABLET BY MOUTH EVERYDAY AT BEDTIME 01/01/20  Yes Burchette, Alinda Sierras, MD  MYRBETRIQ 25 MG TB24 tablet Take 25 mg by mouth daily. 05/11/19  Yes [provider]  PRESCRIPTION MEDICATION CPAP- At bedtime   Yes [provider]     No Known Allergies  ROS:  Out of a complete 14 system review of symptoms, the patient complains only of the following symptoms, and all other reviewed systems are negative.  Vision change Walking difficulty  Blood pressure (!) 158/89, pulse 69, height '5\' 10"'  (1.778 m), weight 225 lb (102.1 kg).  Physical Exam  General: The patient is alert and cooperative at the time of the examination.  Eyes: Pupils are equal, round, and reactive to light. Discs are flat bilaterally.  Neck: The neck is supple, no carotid bruits are noted.  Respiratory: The respiratory examination is clear.  Cardiovascular: The cardiovascular examination reveals a regular rate and rhythm, no obvious murmurs or rubs are noted.  Skin: Extremities are without significant edema.  Neurologic Exam  Mental status: The patient is alert and oriented x 3 at the time of the examination. The patient has apparent normal recent and remote memory, with an apparently normal attention span and concentration ability.  Cranial nerves: Facial symmetry is present. There is good sensation of the face to pinprick and soft touch bilaterally. The strength of the facial muscles and the muscles to  head turning and shoulder shrug are normal bilaterally. Speech is well enunciated, no aphasia or dysarthria is noted. Extraocular movements are full. Visual fields are full, with exception of a significant left inferior quadrantanopsia. The tongue is midline, and the patient has symmetric elevation of the soft palate. No obvious  hearing deficits are noted.  Motor: The motor testing reveals 5 over 5 strength of all 4 extremities. Good symmetric motor tone is noted throughout.  Sensory: Sensory testing is intact to pinprick, soft touch and vibration sensation on all 4 extremities. Position sense is unremarkable in the hands, but is impaired in both feet. No evidence of extinction is noted.  Coordination: Cerebellar testing reveals good finger-nose-finger and heel-to-shin bilaterally.  Gait and station: Gait is normal. Tandem gait is unsteady. Romberg is negative. No drift is seen.  Reflexes: Deep tendon reflexes are symmetric and normal bilaterally. Toes are downgoing bilaterally.   MRI brain 12/18/19:  IMPRESSION: 1. No significant interval change in the acute intraparenchymal right occipital hemorrhage. Mild surrounding edema without significant regional mass effect. 2. 1 cm acute ischemic nonhemorrhagic infarct involving the subcortical left frontal lobe. 3. Innumerable chronic micro hemorrhages throughout both cerebral hemispheres and cerebellum, progressed as compared to 2020. Findings are most suspicious for possible cerebral amyloid angiopathy given distribution. Sequelae of poorly controlled hypertension would be the primary differential consideration. 4. Underlying mild chronic microvascular ischemic disease with chronic right frontal encephalomalacia.  * MRI scan images were reviewed online. I agree with the written report.    CTA head and neck 12/17/19:  IMPRESSION: 1. Stable CTA of the head and neck. No large vessel occlusion. 2. No aneurysm or evidence for vasculitis. No  vascular abnormality seen underlying the right cerebral hemorrhage. 3. Scattered atherosclerotic calcifications about the proximal right and distal vertebral arteries with associated mild multifocal stenoses, stable. 4. Aneurysmal dilatation of the ascending aorta up to 4.1 cm, stable. Recommend annual imaging followup by CTA or MRA. This recommendation follows 2010 ACCF/AHA/AATS/ACR/ASA/SCA/SCAI/SIR/STS/SVM Guidelines for the Diagnosis and Management of Patients with Thoracic Aortic Disease. Circulation. 2010; 121: J287-O676. Aortic aneurysm NOS (ICD10-I71.9) 5. Aortic Atherosclerosis (ICD10-I70.0) and Emphysema (ICD10-J43.9).    Assessment/Plan:  1. History of right frontal intracranial hemorrhage  2. Recent right occipital intracranial hemorrhage  3. History of seizures  4. Possible cerebral amyloid angiopathy  The patient has had a Watchman device placed, and hopefully will be coming off of his anticoagulant therapy soon. We will keep him on Depakote for now, will follow up in 6 months consider cessation of this medication. He did not have any seizures around the time of this most recent intracranial hemorrhage. The patient is not able to operate a motor vehicle at this time due to the visual field change. The patient will contact me if he needs a refill on his medication.   Jill Alexanders MD 03/02/2020 11:33 AM  [facility] [address] [place], [place] [address]  Phone [phone] Fax [phone]

## 2020-03-12 ENCOUNTER — Emergency Department (HOSPITAL_COMMUNITY): Payer: Medicare Other

## 2020-03-12 ENCOUNTER — Emergency Department (HOSPITAL_COMMUNITY)
Admission: EM | Admit: 2020-03-12 | Discharge: 2020-04-09 | Disposition: E | Payer: Medicare Other | Attending: Emergency Medicine | Admitting: Emergency Medicine

## 2020-03-12 DIAGNOSIS — Z20822 Contact with and (suspected) exposure to covid-19: Secondary | ICD-10-CM | POA: Diagnosis not present

## 2020-03-12 DIAGNOSIS — F17210 Nicotine dependence, cigarettes, uncomplicated: Secondary | ICD-10-CM | POA: Diagnosis not present

## 2020-03-12 DIAGNOSIS — R4182 Altered mental status, unspecified: Secondary | ICD-10-CM | POA: Diagnosis not present

## 2020-03-12 DIAGNOSIS — N183 Chronic kidney disease, stage 3 unspecified: Secondary | ICD-10-CM | POA: Diagnosis not present

## 2020-03-12 DIAGNOSIS — R519 Headache, unspecified: Secondary | ICD-10-CM | POA: Diagnosis present

## 2020-03-12 DIAGNOSIS — I629 Nontraumatic intracranial hemorrhage, unspecified: Secondary | ICD-10-CM | POA: Insufficient documentation

## 2020-03-12 DIAGNOSIS — Z7984 Long term (current) use of oral hypoglycemic drugs: Secondary | ICD-10-CM | POA: Insufficient documentation

## 2020-03-12 DIAGNOSIS — Z8546 Personal history of malignant neoplasm of prostate: Secondary | ICD-10-CM | POA: Diagnosis not present

## 2020-03-12 DIAGNOSIS — I5040 Unspecified combined systolic (congestive) and diastolic (congestive) heart failure: Secondary | ICD-10-CM | POA: Insufficient documentation

## 2020-03-12 DIAGNOSIS — E1122 Type 2 diabetes mellitus with diabetic chronic kidney disease: Secondary | ICD-10-CM | POA: Insufficient documentation

## 2020-03-12 DIAGNOSIS — R0902 Hypoxemia: Secondary | ICD-10-CM | POA: Diagnosis not present

## 2020-03-12 DIAGNOSIS — R404 Transient alteration of awareness: Secondary | ICD-10-CM | POA: Diagnosis not present

## 2020-03-12 DIAGNOSIS — Z79899 Other long term (current) drug therapy: Secondary | ICD-10-CM | POA: Diagnosis not present

## 2020-03-12 DIAGNOSIS — G4489 Other headache syndrome: Secondary | ICD-10-CM | POA: Diagnosis not present

## 2020-03-12 DIAGNOSIS — I130 Hypertensive heart and chronic kidney disease with heart failure and stage 1 through stage 4 chronic kidney disease, or unspecified chronic kidney disease: Secondary | ICD-10-CM | POA: Diagnosis not present

## 2020-03-12 DIAGNOSIS — R918 Other nonspecific abnormal finding of lung field: Secondary | ICD-10-CM | POA: Diagnosis not present

## 2020-03-12 DIAGNOSIS — Z4682 Encounter for fitting and adjustment of non-vascular catheter: Secondary | ICD-10-CM | POA: Diagnosis not present

## 2020-03-12 DIAGNOSIS — R29818 Other symptoms and signs involving the nervous system: Secondary | ICD-10-CM | POA: Diagnosis not present

## 2020-03-12 DIAGNOSIS — Z7901 Long term (current) use of anticoagulants: Secondary | ICD-10-CM | POA: Diagnosis not present

## 2020-03-12 DIAGNOSIS — J449 Chronic obstructive pulmonary disease, unspecified: Secondary | ICD-10-CM | POA: Diagnosis not present

## 2020-03-12 DIAGNOSIS — R4020 Unspecified coma: Secondary | ICD-10-CM | POA: Diagnosis not present

## 2020-03-12 DIAGNOSIS — R29810 Facial weakness: Secondary | ICD-10-CM | POA: Diagnosis not present

## 2020-03-12 DIAGNOSIS — I4891 Unspecified atrial fibrillation: Secondary | ICD-10-CM | POA: Insufficient documentation

## 2020-03-12 LAB — RESP PANEL BY RT-PCR (FLU A&B, COVID) ARPGX2
Influenza A by PCR: NEGATIVE
Influenza B by PCR: NEGATIVE
SARS Coronavirus 2 by RT PCR: NEGATIVE

## 2020-03-12 LAB — I-STAT CHEM 8, ED
BUN: 22 mg/dL (ref 8–23)
Calcium, Ion: 1.03 mmol/L — ABNORMAL LOW (ref 1.15–1.40)
Chloride: 99 mmol/L (ref 98–111)
Creatinine, Ser: 1.4 mg/dL — ABNORMAL HIGH (ref 0.61–1.24)
Glucose, Bld: 233 mg/dL — ABNORMAL HIGH (ref 70–99)
HCT: 45 % (ref 39.0–52.0)
Hemoglobin: 15.3 g/dL (ref 13.0–17.0)
Potassium: 3.1 mmol/L — ABNORMAL LOW (ref 3.5–5.1)
Sodium: 138 mmol/L (ref 135–145)
TCO2: 25 mmol/L (ref 22–32)

## 2020-03-12 LAB — COMPREHENSIVE METABOLIC PANEL
ALT: 16 U/L (ref 0–44)
AST: 18 U/L (ref 15–41)
Albumin: 3.7 g/dL (ref 3.5–5.0)
Alkaline Phosphatase: 71 U/L (ref 38–126)
Anion gap: 14 (ref 5–15)
BUN: 19 mg/dL (ref 8–23)
CO2: 24 mmol/L (ref 22–32)
Calcium: 8.6 mg/dL — ABNORMAL LOW (ref 8.9–10.3)
Chloride: 100 mmol/L (ref 98–111)
Creatinine, Ser: 1.44 mg/dL — ABNORMAL HIGH (ref 0.61–1.24)
GFR, Estimated: 51 mL/min — ABNORMAL LOW (ref >60)
Glucose, Bld: 243 mg/dL — ABNORMAL HIGH (ref 70–99)
Potassium: 3.2 mmol/L — ABNORMAL LOW (ref 3.5–5.1)
Sodium: 138 mmol/L (ref 135–145)
Total Bilirubin: 0.8 mg/dL (ref 0.3–1.2)
Total Protein: 5.9 g/dL — ABNORMAL LOW (ref 6.5–8.1)

## 2020-03-12 LAB — DIFFERENTIAL
Abs Immature Granulocytes: 0.08 10*3/uL — ABNORMAL HIGH (ref 0.00–0.07)
Basophils Absolute: 0.1 10*3/uL (ref 0.0–0.1)
Basophils Relative: 1 %
Eosinophils Absolute: 0.1 10*3/uL (ref 0.0–0.5)
Eosinophils Relative: 1 %
Immature Granulocytes: 1 %
Lymphocytes Relative: 18 %
Lymphs Abs: 2 10*3/uL (ref 0.7–4.0)
Monocytes Absolute: 0.9 10*3/uL (ref 0.1–1.0)
Monocytes Relative: 8 %
Neutro Abs: 8.1 10*3/uL — ABNORMAL HIGH (ref 1.7–7.7)
Neutrophils Relative %: 71 %

## 2020-03-12 LAB — CBC
HCT: 46.3 % (ref 39.0–52.0)
Hemoglobin: 15.4 g/dL (ref 13.0–17.0)
MCH: 32.4 pg (ref 26.0–34.0)
MCHC: 33.3 g/dL (ref 30.0–36.0)
MCV: 97.3 fL (ref 80.0–100.0)
Platelets: 207 10*3/uL (ref 150–400)
RBC: 4.76 MIL/uL (ref 4.22–5.81)
RDW: 13.6 % (ref 11.5–15.5)
WBC: 11.3 10*3/uL — ABNORMAL HIGH (ref 4.0–10.5)
nRBC: 0 % (ref 0.0–0.2)

## 2020-03-12 LAB — APTT: aPTT: 22 seconds — ABNORMAL LOW (ref 24–36)

## 2020-03-12 LAB — PROTIME-INR
INR: 1.1 (ref 0.8–1.2)
Prothrombin Time: 13.5 seconds (ref 11.4–15.2)

## 2020-03-12 LAB — VALPROIC ACID LEVEL: Valproic Acid Lvl: 37 ug/mL — ABNORMAL LOW (ref 50.0–100.0)

## 2020-03-12 MED ORDER — GLYCOPYRROLATE 0.2 MG/ML IJ SOLN: 0.2000 mg | INTRAMUSCULAR | Status: DC | PRN

## 2020-03-12 MED ORDER — ACETAMINOPHEN 325 MG PO TABS: 650.0000 mg | ORAL_TABLET | Freq: Four times a day (QID) | ORAL | Status: DC | PRN

## 2020-03-12 MED ORDER — ETOMIDATE 2 MG/ML IV SOLN
INTRAVENOUS | Status: AC | PRN
  Administered 2020-03-12: 30 mg via INTRAVENOUS

## 2020-03-12 MED ORDER — MORPHINE BOLUS VIA INFUSION
1.0000 mg | INTRAVENOUS | Status: DC | PRN
  Filled 2020-03-12: qty 1

## 2020-03-12 MED ORDER — LORAZEPAM 2 MG/ML IJ SOLN
INTRAMUSCULAR | Status: AC
  Administered 2020-03-12: 2 mg via INTRAVENOUS
  Filled 2020-03-12: qty 1

## 2020-03-12 MED ORDER — MORPHINE 100MG IN NS 100ML (1MG/ML) PREMIX INFUSION
1.0000 mg/h | INTRAVENOUS | Status: DC
  Administered 2020-03-12: 1 mg/h via INTRAVENOUS
  Filled 2020-03-12: qty 100

## 2020-03-12 MED ORDER — MANNITOL 20 % IV SOLN
50.0000 g | Freq: Once | Status: AC
  Administered 2020-03-12: 50 g via INTRAVENOUS
  Filled 2020-03-12: qty 250

## 2020-03-12 MED ORDER — SODIUM CHLORIDE 0.9% FLUSH: 3.0000 mL | Freq: Once | INTRAVENOUS | Status: DC

## 2020-03-12 MED ORDER — ALBUTEROL SULFATE (2.5 MG/3ML) 0.083% IN NEBU: 2.5000 mg | INHALATION_SOLUTION | RESPIRATORY_TRACT | Status: DC | PRN

## 2020-03-12 MED ORDER — SUCCINYLCHOLINE CHLORIDE 20 MG/ML IJ SOLN
INTRAMUSCULAR | Status: AC | PRN
  Administered 2020-03-12: 150 mg via INTRAVENOUS

## 2020-03-12 MED ORDER — GLYCOPYRROLATE 0.2 MG/ML IJ SOLN
0.2000 mg | INTRAMUSCULAR | Status: DC | PRN
  Administered 2020-03-12: 0.2 mg via INTRAVENOUS
  Filled 2020-03-12: qty 1

## 2020-03-12 MED ORDER — PROPOFOL 1000 MG/100ML IV EMUL
5.0000 ug/kg/min | INTRAVENOUS | Status: DC
  Administered 2020-03-12: 30 ug/kg/min via INTRAVENOUS

## 2020-03-12 MED ORDER — GLYCOPYRROLATE 1 MG PO TABS
1.0000 mg | ORAL_TABLET | ORAL | Status: DC | PRN
  Filled 2020-03-12: qty 1

## 2020-03-12 MED ORDER — SODIUM CHLORIDE 3 % IV BOLUS
250.0000 mL | Freq: Once | INTRAVENOUS | Status: AC
  Administered 2020-03-12: 250 mL via INTRAVENOUS
  Filled 2020-03-12: qty 250

## 2020-03-12 MED ORDER — ACETAMINOPHEN 650 MG RE SUPP: 650.0000 mg | Freq: Four times a day (QID) | RECTAL | Status: DC | PRN

## 2020-03-12 MED ORDER — CLEVIDIPINE BUTYRATE 0.5 MG/ML IV EMUL: 0.0000 mg/h | INTRAVENOUS | Status: DC

## 2020-03-12 MED ORDER — SCOPOLAMINE 1 MG/3DAYS TD PT72
1.0000 | MEDICATED_PATCH | TRANSDERMAL | Status: DC | PRN
  Administered 2020-03-12: 1.5 mg via TRANSDERMAL
  Filled 2020-03-12 (×2): qty 1

## 2020-03-12 MED ORDER — LORAZEPAM 2 MG/ML IJ SOLN
2.0000 mg | INTRAMUSCULAR | Status: DC | PRN
  Administered 2020-03-12 (×2): 2 mg via INTRAVENOUS
  Filled 2020-03-12 (×2): qty 1

## 2020-03-12 NOTE — Consult Note (Signed)
NAME:  Ronald [name], MRN:  [id number], DOB:  1946-01-23, LOS: 0 ADMISSION DATE:  03/17/2020, CONSULTATION DATE:  14/4/21 REFERRING MD:  Lajean Saver, CHIEF COMPLAINT:  Altered Mental Status  Brief History   This is a 74 yo male with a history of HTN, HLD, A fib, COPD, left frontal hemorrhagic stroke, unprovoked PE and seizure disorder who presented this evening with altered mental status found to have large left frontal lobe and basal ganglia hemorrhage.   History of present illness   This is a 74 yo male with a history of HTN, HLD, A fib, COPD, left frontal hemorrhagic stroke, unprovoked PE and seizure disorder who presented this evening with altered mental status found to have large left frontal lobe and basal ganglia hemorrhage. Patient had headache this evening. Short thereafter began to have nausea and vomiting and than become unresponsive. Came into the ED with GCS of 4-5. Promplty intubated and sen to CT scanner. In Ct patient found to have  left frontal lobe and basal ganglia hemorrhage  Past Medical History  HTN HLd Atrial fibrillation status post watchman recently still on anticoagulation COPD Unprovoked pulmonary embolus     Significant Hospital Events   Patient intubated on March 12, 2020  Consults:  PCCM  Neurology  Procedures:  Intubation  Significant Diagnostic Tests:  CT head demonstrating large hemorrhagic stroke  IMPRESSION: Very large acute parenchymal hemorrhage centered within the left frontal lobe and left basal ganglia measuring 8.6 x 7.5 cm. Overlying moderate volume acute subarachnoid hemorrhage. Moderate to large volume intraventricular extension of hemorrhage.  Severe mass effect with severe effacement of the left lateral ventricle and 2.1 cm rightward midline shift. Rightward subfalcine herniation.  Asymmetric prominence of the right lateral ventricle compatible with entrapment.    Micro Data:  Not applicable  Antimicrobials:   Not applicable  Interim history/subjective:  Not applicable  Objective   Blood pressure 131/76, pulse (!) 57, temperature 97.8 F (36.6 C), temperature source Temporal, resp. rate (!) 21, SpO2 94 %.    Vent Mode: PRVC FiO2 (%):  [100 %] 100 % Set Rate:  [18 bmp] 18 bmp Vt Set:  [580 mL] 580 mL PEEP:  [5 cmH20] 5 cmH20 Plateau Pressure:  [18 cmH20] 18 cmH20   Intake/Output Summary (Last 24 hours) at 03/29/2020 2148 Last data filed at 03/19/2020 2139 Gross per 24 hour  Intake 45.64 ml  Output --  Net 45.64 ml   There were no vitals filed for this visit.  Examination: General: Patient intubated and sedated HENT: Pinpoint pupils bilterally Lungs: Mechanical breath sounds Cardiovascular: regular rate and rhythm Abdomen: Sof non tender and non distended Extremities: No focal deformitites Neuro: no arousal to voice or stimulation. Will trigger vent intermittently. No gag noted GU: Condom catheter in place  Resolved Hospital Problem list   Not applicable  Assessment & Plan:  This is a 75 year old with history as noted above who presented for altered mental status after an episode of headache followed by nausea and vomiting.  Found to have large frontal hemorrhage.  PCCM consulted to help in end-of-life discussions.  Hemorrhagic stroke-noted on head CT to be in the left frontal lobe and left basal ganglia.  This would be devastating.  Wife aware.  She notes that patient would never want to live like this if he were to have significant impairment.  After discussion with neurologist myself it is apparent that patient would have a significant impact in quality of life with this devastating hemorrhagic  stroke.  With this in mind wife has elected to make patient comfort care.  She is waiting on to relatives to see patient.  At which point we will initiate compassionate extubation. -Comfort care orders placed -We will give bolus of morphine and Ativan and also glycopyrrolate prior to  extubation -Wife aware that patient will likely pass shortly after compassionate extubation    Best practice (evaluated daily)   Diet: NA Pain/Anxiety/Delirium protocol (if indicated): NA VAP protocol (if indicated): NA DVT prophylaxis: NA GI prophylaxis: NA Glucose control: NA Mobility: NA Family and staff present Wife at bedside and is decision maker Summary of discussion Patient to be made comfort care with compassionate extubation Code Status: DNR-CC   Labs   CBC: Recent Labs  Lab 04/05/2020 2007 04/03/2020 2009  WBC  --  11.3*  NEUTROABS  --  8.1*  HGB 15.3 15.4  HCT 45.0 46.3  MCV  --  97.3  PLT  --  119    Basic Metabolic Panel: Recent Labs  Lab 03/13/2020 2007 03/21/2020 2009  NA 138 138  K 3.1* 3.2*  CL 99 100  CO2  --  24  GLUCOSE 233* 243*  BUN 22 19  CREATININE 1.40* 1.44*  CALCIUM  --  8.6*   GFR: Estimated Creatinine Clearance: 53.9 mL/min (A) (by C-G formula based on SCr of 1.44 mg/dL (H)). Recent Labs  Lab 04/05/2020 2009  WBC 11.3*    Liver Function Tests: Recent Labs  Lab 03/22/2020 2009  AST 18  ALT 16  ALKPHOS 71  BILITOT 0.8  PROT 5.9*  ALBUMIN 3.7   No results for input(s): LIPASE, AMYLASE in the last 168 hours. No results for input(s): AMMONIA in the last 168 hours.  ABG    Component Value Date/Time   PHART 7.486 (H) 01/04/2019 1200   PCO2ART 38.0 01/04/2019 1200   PO2ART 53.6 (L) 01/04/2019 1200   HCO3 28.4 (H) 01/04/2019 1200   TCO2 25 03/26/2020 2007   O2SAT 88.9 01/04/2019 1200     Coagulation Profile: Recent Labs  Lab 04/03/2020 2009  INR 1.1    Cardiac Enzymes: No results for input(s): CKTOTAL, CKMB, CKMBINDEX, TROPONINI in the last 168 hours.  HbA1C: Hemoglobin A1C  Date/Time Value Ref Range Status  05/19/2019 10:01 AM 5.9 (A) 4.0 - 5.6 % Final   Hgb A1c MFr Bld  Date/Time Value Ref Range Status  12/18/2019 06:44 AM 6.3 (H) 4.8 - 5.6 % Final    Comment:    (NOTE) Pre diabetes:           5.7%-6.4%  Diabetes:              >6.4%  Glycemic control for   <7.0% adults with diabetes   02/16/2019 10:37 AM 6.0 4.6 - 6.5 % Final    Comment:    Glycemic Control Guidelines for People with Diabetes:Non Diabetic:  <6%Goal of Therapy: <7%Additional Action Suggested:  >8%     CBG: No results for input(s): GLUCAP in the last 168 hours.  Review of Systems:   Unable to obtain.  Past Medical History  He,  has a past medical history of Allergic rhinitis, Anticoagulant long-term use, Atrial fibrillation ([place]), COPD with emphysema ([place]), History of colon polyps, History of gout, History of sepsis (02/18/2017), History of YAG laser capsulotomy of lens (December 2020, January 2021), Hyperlipidemia, Hyperplasia of prostate with lower urinary tract symptoms (LUTS), Hypertension, OSA on CPAP, Persistent atrial fibrillation ([place]), Prostate cancer ([place]) (urologsit-  dr Alyson Ingles---  as of 05-21-2017 per pt last PSA 11 approx.), 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 ([place]), Seasonal allergies, Sigmoid diverticulosis, Stroke ([place]) (28/00/349), Systolic and diastolic CHF, chronic ([place]), Tinea versicolor, Type 2 diabetes mellitus ([place]), and Wears hearing aid in both ears.   Surgical History    Past Surgical History:  Procedure Laterality Date  . APPENDECTOMY  1967  . BACK SURGERY  2005  . CARDIOVERSION N/A 05/07/2014   Procedure: CARDIOVERSION;  Surgeon: [name], MD;  Location: [facility] ENDOSCOPY;  Service: Cardiovascular;  Laterality: N/A;  . CARDIOVERSION N/A 09/09/2014   Procedure: CARDIOVERSION;  Surgeon: Lelon Perla, MD;  Location: [facility] ENDOSCOPY;  Service: Cardiovascular;  Laterality: N/A;  successfully  . CATARACT EXTRACTION W/ INTRAOCULAR LENS  IMPLANT, BILATERAL  08 and 09/  2018  . COLONOSCOPY  last one 06-07-2011  . EYE SURGERY Bilateral 2018   cataract  . GOLD SEED IMPLANT N/A 09/09/2017   Procedure: GOLD SEED IMPLANT;  Surgeon: [name], MD;  Location:  [facility];  Service: Urology;  Laterality: N/A;  . HYDROCELE EXCISION Bilateral 09/26/2015   Procedure: HYDROCELECTOMY ADULT;  Surgeon: [name], MD;  Location: [facility];  Service: Urology;  Laterality: Bilateral;  . LAMINECTOMY AND MICRODISCECTOMY LUMBAR SPINE  12-23-2003   Left L5 -- S1  . LAPAROSCOPIC BILATERAL INGUINAL HERNIA REPAIR/  UMBILICAL HERNIA REPAIR WITH MESH/  ASPIRATION LEFT HYDROCELE  07-11-2012  . LEFT ATRIAL APPENDAGE OCCLUSION N/A 02/11/2020   Procedure: LEFT ATRIAL APPENDAGE OCCLUSION;  Surgeon: Vickie Epley, MD;  Location: [facility];  Service: Cardiovascular;  Laterality: N/A;  . LEFT ATRIAL APPENDAGE OCCLUSION    . NM MYOVIEW LTD  05/18/2014   Low risk study. Normal perfusion: No ischemia or infarction. Mild LV dysfunction - 46% (does not correlate with echocardiographic EF of 50-55%)  . PROSTATE BIOPSY  05/15/12   Clinically both Lobes  . SPACE OAR INSTILLATION N/A 09/09/2017   Procedure: SPACE OAR INSTILLATION;  Surgeon: [name], MD;  Location: [facility];  Service: Urology;  Laterality: N/A;  . TEE WITHOUT CARDIOVERSION N/A 05/07/2014   Procedure: TRANSESOPHAGEAL ECHOCARDIOGRAM (TEE);  Surgeon: [name], MD;  Location: [facility] ENDOSCOPY;  Service: Cardiovascular;  Laterality: N/A;   mild atherosclerosis plaque of aorta,  mild AR, MR, and TR,  no cardiac source of emboli  . TONSILLECTOMY  as child  . TRANSTHORACIC ECHOCARDIOGRAM  11/'18; 1/'19   a) In setting of sepsis: EF of 40-45%.  Diffuse hypokinesis.  No RWMA.  Biatrial enlargement.;; b) ** f/u Jan 2019: Normal LVF 55-60%** , mildly dilated aortic root(38 mm) and ascending aorta (44 mm). Compared to prior echo, LVEF has improved.  . TRANSTHORACIC ECHOCARDIOGRAM  7/'20; 9/'20   a) Hemorrhagic CVA/SAH - EF> 65%.  Moderate concentric LVH.-GR 1 DD mild RA dilation.; b) EF 60 to 65%.  Mild LVH.  GRII DD.  Normal RV size and function.  Mild LA  dilation.  Normal RA size.  Unable to assess PA pressures.  No significant aortic sclerosis.  . TRANSURETHRAL RESECTION OF PROSTATE N/A 05/30/2017   Procedure: TRANSURETHRAL RESECTION OF THE PROSTATE (TURP);  Surgeon: [name], MD;  Location: [facility] ORS;  Service: Urology;  Laterality: N/A;     Social History   reports that he has been smoking cigarettes. He has a 60.00 pack-year smoking history. He has never used smokeless tobacco. He reports previous alcohol use. He reports that he does not  use drugs.   Family History   His family history includes Breast cancer in his mother; Ovarian cancer in his sister; Stroke in his mother and another family member. There is no history of Colon cancer, Esophageal cancer, Rectal cancer, or Stomach cancer.   Allergies No Known Allergies   Home Medications  Prior to Admission medications   Medication Sig Start Date End Date Taking? Authorizing Provider  amiodarone (PACERONE) 200 MG tablet Take 1 tablet (200 mg total) by mouth 2 (two) times daily. 01/21/20   Vickie Epley, MD  apixaban (ELIQUIS) 5 MG TABS tablet Take 1 tablet (5 mg total) by mouth 2 (two) times daily. 01/21/20   Vickie Epley, MD  ascorbic acid (VITAMIN C) 500 MG tablet Take 500 mg by mouth daily.    [provider]  aspirin EC 81 MG EC tablet Take 1 tablet (81 mg total) by mouth daily. Swallow whole. 12/23/19   Donzetta Starch, NP  atorvastatin (LIPITOR) 10 MG tablet TAKE 1 TABLET BY MOUTH DAILY EACH EVENING 01/16/19   Burchette, Alinda Sierras, MD  Blood Glucose Monitoring Suppl (ACCU-CHEK AVIVA PLUS) w/Device KIT Use blood glucose machine as instructed on your strips 12/12/18   Angiulli, Lavon Paganini, PA-C  carvedilol (COREG) 6.25 MG tablet Take 1 tablet (6.25 mg total) by mouth 2 (two) times daily with a meal. 11/13/19   Burchette, Alinda Sierras, MD  Cholecalciferol (VITAMIN D-3) 25 MCG (1000 UT) CAPS Take 1,000 Units by mouth daily.     [provider]  colchicine 0.6 MG  tablet TAKE 1 TABLET (0.6 MG TOTAL) BY MOUTH 2 (TWO) TIMES DAILY AS NEEDED (GOUT FLARE). 02/22/20   Burchette, Alinda Sierras, MD  divalproex (DEPAKOTE) 500 MG DR tablet TAKE 1 TABLET BY MOUTH EVERY 12 HOURS 11/30/19   Dohmeier, Asencion Partridge, MD  furosemide (LASIX) 40 MG tablet TAKE 1 TABLET (40 MG TOTAL) BY MOUTH 2 (TWO) TIMES DAILY WITH BREAKFAST AND LUNCH. 02/22/20   Burchette, Alinda Sierras, MD  glucose blood (ACCU-CHEK AVIVA PLUS) test strip Test 2 times daily dx e11.9 12/12/18   Angiulli, Lavon Paganini, PA-C  Lancets (ACCU-CHEK SOFT TOUCH) lancets Check blood sugars once per day. DX: E11.9 12/12/18   Angiulli, Lavon Paganini, PA-C  losartan (COZAAR) 100 MG tablet Take 1 tablet (100 mg total) by mouth daily. 01/21/20 04/20/20  Vickie Epley, MD  metFORMIN (GLUCOPHAGE) 500 MG tablet Take 500 mg by mouth every evening.    [provider]  montelukast (SINGULAIR) 10 MG tablet TAKE 1 TABLET BY MOUTH EVERYDAY AT BEDTIME 01/01/20   Burchette, Alinda Sierras, MD  MYRBETRIQ 25 MG TB24 tablet Take 25 mg by mouth daily. 05/11/19   [provider]  PRESCRIPTION MEDICATION CPAP- At bedtime    [provider]     Critical care time: 55 minutes

## 2020-03-12 NOTE — ED Notes (Signed)
ET tube confirmed via xray, transported to ct 1

## 2020-03-12 NOTE — ED Notes (Signed)
Pt is comfort care.

## 2020-03-12 NOTE — ED Provider Notes (Signed)
[facility] EMERGENCY DEPARTMENT Provider Note   CSN: [id number] Arrival date & time: 04/08/2020  2005     History Chief Complaint  Patient presents with  . Code Stroke    Ronald [name] is a 74 y.o. male.  Patient with history afib, on anticoagulation therapy, hx intracranial hemorrhage, presents with acute alteration in mental status/unresponsive as code stroke activation. Pt unresponsive - level 5 caveat. Per EMS report, shortly pta, was noted to complain of headache, then episode nv, followed by unresponsiveness.   The history is provided by the patient and the EMS personnel. The history is limited by the condition of the patient.       Past Medical History:  Diagnosis Date  . Allergic rhinitis   . Anticoagulant long-term use    eliquis  . Atrial fibrillation ([place])   . COPD with emphysema [facility])    pulmologist-  dr Halford Chessman  . History of colon polyps    tubular adenoma 2013  . History of gout    09-05-2017 last flare-up  05/ 2019 3 wks ago, feet  . History of sepsis 02/18/2017   per d/c note probable uti, acute chf, acute renal failure, hypoxia  . History of YAG laser capsulotomy of lens December 2020, January 2021  . Hyperlipidemia   . Hyperplasia of prostate with lower urinary tract symptoms (LUTS)   . Hypertension   . OSA on CPAP    per study 08-03-2004  Severe OSA  . Persistent atrial fibrillation [facility])    cardiologist --  dr Dorris Carnes--  post cardioversion 05-07-2014  . Prostate cancer [facility]) urologsit-  dr Alyson Ingles--- as of 05-21-2017 per pt last PSA 11 approx.   Dx  2014--  stage T1c, Gleason 3+3=6, PSA 6.67--  Active surveillance/  04/ 2019  Stage T1b, Gleason 3+4, PSA 12.8- plan external radiation therapy    . 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 ([place])    pulmologist-  dr Halford Chessman  . Seasonal allergies   . Sigmoid diverticulosis   . Stroke ([place]) 09/09/021  . Systolic and diastolic CHF, chronic [facility])    cardiologist-   [name]  . Tinea versicolor   . Type 2 diabetes mellitus ([place])   . Wears hearing aid in both ears     Patient Active Problem List   Diagnosis Date Noted  . Atrial fibrillation ([employer]) 02/11/2020  . Persistent atrial fibrillation ([place]) 01/06/2020  . Secondary hypercoagulable state ([place]) 01/06/2020  . Back pain 01/23/2019  . Lower extremity edema 01/15/2019  . Fatigue due to treatment 01/15/2019  . Pulmonary embolism ([place]) 01/04/2019  . Acute respiratory failure with hypoxia ([place]) 01/04/2019  . Pain   . AMS (altered mental status) 11/23/2018  . Acute encephalopathy 11/22/2018  . CKD (chronic kidney disease), stage III ([place]) 11/22/2018  . Thoracic ascending aortic aneurysm ([place]) 11/22/2018  . Seizures ([place]), secondsry to [facility] 10/23/2018  . Coagulopathy ([place]), Eliquis 10/23/2018  . Cerebral edema ([place]) 10/23/2018  . Hypertensive emergency 10/23/2018  . Advanced age 60/16/2020  . ICH (intracerebral hemorrhage) ([place]) w/ SAH while on Eliquis 10/21/2018  . Hepatic steatosis 04/22/2018  . Actinic keratoses 06/07/2017  . BPH (benign prostatic hyperplasia) 05/30/2017  . Cardiomyopathy due to systemic disease ([place]) 03/09/2017  . Chronic diastolic CHF (congestive heart failure) ([place])   . History of colonic polyps 07/03/2016  . Chronic anticoagulation 07/03/2016  . Hyperglycemia, drug-induced 04/05/2015  . 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 On amiodarone therapy  12/08/2014  . Edema of both legs 12/08/2014  . Exertional dyspnea 08/18/2014  . Hypokalemia   . Tobacco abuse   . Paroxysmal atrial fibrillation (H[facility]) - CHA2DS2Vasc = 7 05/04/2014  . Cigarette smoker two packs a day or less   . Prostate cancer ([place]) 10/15/2013  . Obesity (BMI 30-39.9) 04/17/2013  . Umbilical hernia 37/62/8315  . Right inguinal hernia 06/23/2012  . Hydrocele 06/19/2012  . Metabolic syndrome 17/61/6073  . Type 2 diabetes mellitus with hyperglycemia ([place])  03/20/2012  . Elevated PSA 03/20/2012  . Gout, unspecified 10/07/2009  . TINEA VERSICOLOR 07/19/2009  . PERS HX TOBACCO USE PRESENTING HAZARDS HEALTH 07/19/2009  . Obstructive sleep apnea 07/02/2008  . Hyperlipidemia with target LDL less than 70 07/01/2008  . Essential hypertension 07/01/2008  . ALLERGIC RHINITIS 07/01/2008    Past Surgical History:  Procedure Laterality Date  . APPENDECTOMY  1967  . BACK SURGERY  2005  . CARDIOVERSION N/A 05/07/2014   Procedure: CARDIOVERSION;  Surgeon: [name], MD;  Location: [facility] ENDOSCOPY;  Service: Cardiovascular;  Laterality: N/A;  . CARDIOVERSION N/A 09/09/2014   Procedure: CARDIOVERSION;  Surgeon: Lelon Perla, MD;  Location: [facility] ENDOSCOPY;  Service: Cardiovascular;  Laterality: N/A;  successfully  . CATARACT EXTRACTION W/ INTRAOCULAR LENS  IMPLANT, BILATERAL  08 and 09/  2018  . COLONOSCOPY  last one 06-07-2011  . EYE SURGERY Bilateral 2018   cataract  . GOLD SEED IMPLANT N/A 09/09/2017   Procedure: GOLD SEED IMPLANT;  Surgeon: [name], MD;  [facility];  Service: Urology;  Laterality: N/A;  . HYDROCELE EXCISION Bilateral 09/26/2015   Procedure: HYDROCELECTOMY ADULT;  Surgeon: [name], MD;  Location: [facility];  Service: Urology;  Laterality: Bilateral;  . LAMINECTOMY AND MICRODISCECTOMY LUMBAR SPINE  12-23-2003   Left L5 -- S1  . LAPAROSCOPIC BILATERAL INGUINAL HERNIA REPAIR/  UMBILICAL HERNIA REPAIR WITH MESH/  ASPIRATION LEFT HYDROCELE  07-11-2012  . LEFT ATRIAL APPENDAGE OCCLUSION N/A 02/11/2020   Procedure: LEFT ATRIAL APPENDAGE OCCLUSION;  Surgeon: Vickie Epley, MD;  Location: [place] CV LAB;  Service: Cardiovascular;  Laterality: N/A;  . LEFT ATRIAL APPENDAGE OCCLUSION    . NM MYOVIEW LTD  05/18/2014   Low risk study. Normal perfusion: No ischemia or infarction. Mild LV dysfunction - 46% (does not correlate with echocardiographic EF of 50-55%)  . PROSTATE  BIOPSY  05/15/12   Clinically both [place]  . SPACE OAR INSTILLATION N/A 09/09/2017   Procedure: SPACE OAR INSTILLATION;  Surgeon: [name], MD;  Location[facility];  Service: Urology;  Laterality: N/A;  . TEE WITHOUT CARDIOVERSION N/A 05/07/2014   Procedure: TRANSESOPHAGEAL ECHOCARDIOGRAM (TEE);  Surgeon: [name], MD;  Location: [facility] ENDOSCOPY;  Service: Cardiovascular;  Laterality: N/A;   mild atherosclerosis plaque of aorta,  mild AR, MR, and TR,  no cardiac source of emboli  . TONSILLECTOMY  as child  . TRANSTHORACIC ECHOCARDIOGRAM  11/'18; 1/'19   a) In setting of sepsis: EF of 40-45%.  Diffuse hypokinesis.  No RWMA.  Biatrial enlargement.;; b) ** f/u Jan 2019: Normal LVF 55-60%** , mildly dilated aortic root(38 mm) and ascending aorta (44 mm). Compared to prior echo, LVEF has improved.  . TRANSTHORACIC ECHOCARDIOGRAM  7/'20; 9/'20   a) Hemorrhagic CVA/SAH - EF> 65%.  Moderate concentric LVH.-GR 1 DD mild RA dilation.; b) EF 60 to 65%.  Mild LVH.  GRII DD.  Normal RV size and function.  Mild LA dilation.  Normal RA size.  Unable to assess PA pressures.  No significant aortic sclerosis.  . TRANSURETHRAL RESECTION OF PROSTATE N/A 05/30/2017   Procedure: TRANSURETHRAL RESECTION OF THE PROSTATE (TURP);  Surgeon: [name], MD;  Location: [facility] ORS;  Service: Urology;  Laterality: N/A;       Family History  Problem Relation Age of Onset  . Breast cancer Mother   . Stroke Mother   . Stroke Other        Unknown   . Ovarian cancer Sister   . Colon cancer Neg Hx   . Esophageal cancer Neg Hx   . Rectal cancer Neg Hx   . Stomach cancer Neg Hx     Social History   Tobacco Use  . Smoking status: Current Some Day Smoker    Packs/day: 1.00    Years: 60.00    Pack years: 60.00    Types: Cigarettes  . Smokeless tobacco: Never Used  . Tobacco comment: pack daily  Vaping Use  . Vaping Use: Every day  Substance Use Topics  . Alcohol use: Not Currently  .  Drug use: No    Home Medications Prior to Admission medications   Medication Sig Start Date End Date Taking? Authorizing Provider  amiodarone (PACERONE) 200 MG tablet Take 1 tablet (200 mg total) by mouth 2 (two) times daily. 01/21/20   Vickie Epley, MD  apixaban (ELIQUIS) 5 MG TABS tablet Take 1 tablet (5 mg total) by mouth 2 (two) times daily. 01/21/20   Vickie Epley, MD  ascorbic acid (VITAMIN C) 500 MG tablet Take 500 mg by mouth daily.    [provider]  aspirin EC 81 MG EC tablet Take 1 tablet (81 mg total) by mouth daily. Swallow whole. 12/23/19   Donzetta Starch, NP  atorvastatin (LIPITOR) 10 MG tablet TAKE 1 TABLET BY MOUTH DAILY EACH EVENING 01/16/19   Burchette, Alinda Sierras, MD  Blood Glucose Monitoring Suppl (ACCU-CHEK AVIVA PLUS) w/Device KIT Use blood glucose machine as instructed on your strips 12/12/18   Angiulli, Lavon Paganini, PA-C  carvedilol (COREG) 6.25 MG tablet Take 1 tablet (6.25 mg total) by mouth 2 (two) times daily with a meal. 11/13/19   Burchette, Alinda Sierras, MD  Cholecalciferol (VITAMIN D-3) 25 MCG (1000 UT) CAPS Take 1,000 Units by mouth daily.     [provider]  colchicine 0.6 MG tablet TAKE 1 TABLET (0.6 MG TOTAL) BY MOUTH 2 (TWO) TIMES DAILY AS NEEDED (GOUT FLARE). 02/22/20   Burchette, Alinda Sierras, MD  divalproex (DEPAKOTE) 500 MG DR tablet TAKE 1 TABLET BY MOUTH EVERY 12 HOURS 11/30/19   Dohmeier, Asencion Partridge, MD  furosemide (LASIX) 40 MG tablet TAKE 1 TABLET (40 MG TOTAL) BY MOUTH 2 (TWO) TIMES DAILY WITH BREAKFAST AND LUNCH. 02/22/20   Burchette, Alinda Sierras, MD  glucose blood (ACCU-CHEK AVIVA PLUS) test strip Test 2 times daily dx e11.9 12/12/18   Angiulli, Lavon Paganini, PA-C  Lancets (ACCU-CHEK SOFT TOUCH) lancets Check blood sugars once per day. DX: E11.9 12/12/18   Angiulli, Lavon Paganini, PA-C  losartan (COZAAR) 100 MG tablet Take 1 tablet (100 mg total) by mouth daily. 01/21/20 04/20/20  Vickie Epley, MD  metFORMIN (GLUCOPHAGE) 500 MG tablet Take 500 mg by  mouth every evening.    [provider]  montelukast (SINGULAIR) 10 MG tablet TAKE 1 TABLET BY MOUTH EVERYDAY AT BEDTIME 01/01/20   Burchette, Alinda Sierras, MD  MYRBETRIQ 25 MG TB24 tablet  Take 25 mg by mouth daily. 05/11/19   [provider]  PRESCRIPTION MEDICATION CPAP- At bedtime    [provider]    Allergies    Patient has no known allergies.  Review of Systems   Review of Systems  Unable to perform ROS: Patient unresponsive  level 5 caveat, pt unresponsive.     Physical Exam Updated Vital Signs BP (!) 188/98   Pulse 64   Temp 97.8 F (36.6 C) (Temporal)   Resp 18   SpO2 96%   Physical Exam Vitals and nursing note reviewed.  Constitutional:      Appearance: Normal appearance. He is well-developed.  HENT:     Head: Atraumatic.     Nose: Nose normal.     Mouth/Throat:     Mouth: Mucous membranes are moist.     Comments: Emesis about face/mouth.  Eyes:     General: No scleral icterus.    Conjunctiva/sclera: Conjunctivae normal.     Comments: Pupils equal, minimally reactive.   Neck:     Vascular: No carotid bruit.     Trachea: No tracheal deviation.  Cardiovascular:     Rate and Rhythm: Normal rate and regular rhythm.     Pulses: Normal pulses.     Heart sounds: Normal heart sounds. No murmur heard.  No friction rub. No gallop.   Pulmonary:     Effort: Pulmonary effort is normal. No accessory muscle usage or respiratory distress.     Breath sounds: Normal breath sounds.  Abdominal:     General: Bowel sounds are normal. There is no distension.     Palpations: Abdomen is soft.     Tenderness: There is no abdominal tenderness.  Genitourinary:    Comments: No cva tenderness. Musculoskeletal:        General: No swelling or tenderness.     Cervical back: Normal range of motion and neck supple. No rigidity.  Skin:    General: Skin is warm and dry.     Findings: No rash.  Neurological:     Mental Status: He is alert.     Comments: Pt  unresponsive. GCS 4.   Psychiatric:     Comments: Pt unresponsive.       ED Results / Procedures / Treatments   Labs (all labs ordered are listed, but only abnormal results are displayed) Results for orders placed or performed during the hospital encounter of 03/31/2020  Protime-INR  Result Value Ref Range   Prothrombin Time 13.5 11.4 - 15.2 seconds   INR 1.1 0.8 - 1.2  APTT  Result Value Ref Range   aPTT 22 (L) 24 - 36 seconds  CBC  Result Value Ref Range   WBC 11.3 (H) 4.0 - 10.5 K/uL   RBC 4.76 4.22 - 5.81 MIL/uL   Hemoglobin 15.4 13.0 - 17.0 g/dL   HCT 46.3 39 - 52 %   MCV 97.3 80.0 - 100.0 fL   MCH 32.4 26.0 - 34.0 pg   MCHC 33.3 30.0 - 36.0 g/dL   RDW 13.6 11.5 - 15.5 %   Platelets 207 150 - 400 K/uL   nRBC 0.0 0.0 - 0.2 %  Differential  Result Value Ref Range   Neutrophils Relative % 71 %   Neutro Abs 8.1 (H) 1.7 - 7.7 K/uL   Lymphocytes Relative 18 %   Lymphs Abs 2.0 0.7 - 4.0 K/uL   Monocytes Relative 8 %   Monocytes Absolute 0.9 0.1 - 1.0 K/uL  Eosinophils Relative 1 %   Eosinophils Absolute 0.1 0.0 - 0.5 K/uL   Basophils Relative 1 %   Basophils Absolute 0.1 0.0 - 0.1 K/uL   Immature Granulocytes 1 %   Abs Immature Granulocytes 0.08 (H) 0.00 - 0.07 K/uL  Comprehensive metabolic panel  Result Value Ref Range   Sodium 138 135 - 145 mmol/L   Potassium 3.2 (L) 3.5 - 5.1 mmol/L   Chloride 100 98 - 111 mmol/L   CO2 24 22 - 32 mmol/L   Glucose, Bld 243 (H) 70 - 99 mg/dL   BUN 19 8 - 23 mg/dL   Creatinine, Ser 1.44 (H) 0.61 - 1.24 mg/dL   Calcium 8.6 (L) 8.9 - 10.3 mg/dL   Total Protein 5.9 (L) 6.5 - 8.1 g/dL   Albumin 3.7 3.5 - 5.0 g/dL   AST 18 15 - 41 U/L   ALT 16 0 - 44 U/L   Alkaline Phosphatase 71 38 - 126 U/L   Total Bilirubin 0.8 0.3 - 1.2 mg/dL   GFR, Estimated 51 (L) >60 mL/min   Anion gap 14 5 - 15  I-stat chem 8, ED  Result Value Ref Range   Sodium 138 135 - 145 mmol/L   Potassium 3.1 (L) 3.5 - 5.1 mmol/L   Chloride 99 98 - 111 mmol/L    BUN 22 8 - 23 mg/dL   Creatinine, Ser 1.40 (H) 0.61 - 1.24 mg/dL   Glucose, Bld 233 (H) 70 - 99 mg/dL   Calcium, Ion 1.03 (L) 1.15 - 1.40 mmol/L   TCO2 25 22 - 32 mmol/L   Hemoglobin 15.3 13.0 - 17.0 g/dL   HCT 45.0 39 - 52 %   EKG EKG Interpretation  Date/Time:  Saturday March 12 2020 20:06:19 EST Ventricular Rate:  57 PR Interval:    QRS Duration: 103 QT Interval:  480 QTC Calculation: 468 R Axis:   36 Text Interpretation: Sinus arrhythmia Premature ventricular complexes Confirmed by Lajean Saver [phone]) on 03/17/2020 8:29:28 PM   Radiology DG Chest Portable 1 View  Result Date: 03/21/2020 CLINICAL DATA:  Post intubation EXAM: PORTABLE CHEST 1 VIEW COMPARISON:  None. FINDINGS: The heart size and mediastinal contours are mildly enlarged. ETT is seen 3 cm above the carina. NG tube is seen coursing below the diaphragm. Increased prominence of the central pulmonary vasculature. Patchy airspace opacities are seen at both lung bases. IMPRESSION: ET tube and NG tube in satisfactory position. Patchy airspace opacities predominantly at both lung bases which could be due to atelectasis and/or infectious etiology. Electronically Signed   By: Prudencio Pair M.D.   On: 03/23/2020 20:42   CT HEAD CODE STROKE WO CONTRAST  Addendum Date: 03/24/2020   ADDENDUM REPORT: 03/16/2020 21:08 ADDENDUM: These results were called by telephone at the time of interpretation on 03/09/2020 at 9:00 pm to provider Dr. Curly Shores, who verbally acknowledged these results. Electronically Signed   By: Kellie Simmering DO   On: 03/29/2020 21:08   Result Date: 03/23/2020 CLINICAL DATA:  Code stroke. Neuro deficit, acute, stroke suspected. Additional provided: Unresponsive. EXAM: CT HEAD WITHOUT CONTRAST TECHNIQUE: Contiguous axial images were obtained from the base of the skull through the vertex without intravenous contrast. COMPARISON:  Brain MRI 12/18/2019.  Head CT 12/18/2019. FINDINGS: Brain: There is a very large acute  parenchymal hemorrhage centered within the left frontal lobe and left basal ganglia measuring 8.6 x 7.5 x 7.2 cm. Overlying moderate volume acute subarachnoid hemorrhage. There is moderate to large volume intraventricular  extension of hemorrhage. Severe mass effect with severe effacement of the left lateral ventricle and 2.1 cm rightward midline shift. Asymmetric prominence of the right lateral ventricle compatible with entrapment. Associated rightward subfalcine herniation. There is a small amount of hyperdensity within a region of encephalomalacia within the right occipital lobe at site of prior parenchymal hemorrhage, which may reflect a small amount of interval hemorrhage or cortical laminar necrosis. Redemonstrated chronic encephalomalacia within anterior right frontal lobe. Vascular: No hyperdense vessel. Skull: Normal. Negative for fracture or focal lesion. Sinuses/Orbits: Visualized orbits show no acute finding. Mild ethmoid sinus mucosal thickening. Small sphenoid sinus air-fluid levels. Partially visualized support tubes. IMPRESSION: Very large acute parenchymal hemorrhage centered within the left frontal lobe and left basal ganglia measuring 8.6 x 7.5 cm. Overlying moderate volume acute subarachnoid hemorrhage. Moderate to large volume intraventricular extension of hemorrhage. Severe mass effect with severe effacement of the left lateral ventricle and 2.1 cm rightward midline shift. Rightward subfalcine herniation. Asymmetric prominence of the right lateral ventricle compatible with entrapment. Small amount of hyperdensity within a region of encephalomalacia in the right occipital lobe at site of prior parenchymal hemorrhage, which may reflect a small amount of acute hemorrhage or cortical laminar necrosis. Redemonstrated chronic encephalomalacia within the anterior right frontal lobe. Electronically Signed: By: Kellie Simmering DO On: 03/09/2020 20:49    Procedures Procedure Name: Intubation Date/Time:  04/01/2020 8:52 PM Performed by: Lajean Saver, MD Pre-anesthesia Checklist: Patient identified, Patient being monitored, Emergency Drugs available, Timeout performed and Suction available Oxygen Delivery Method: Non-rebreather mask Preoxygenation: Pre-oxygenation with 100% oxygen Induction Type: Rapid sequence Ventilation: Mask ventilation without difficulty Laryngoscope Size: Glidescope Tube size: 7.5 mm Number of attempts: 1 Placement Confirmation: ETT inserted through vocal cords under direct vision,  CO2 detector and Breath sounds checked- equal and bilateral Secured at: 23 cm Tube secured with: ETT holder      (including critical care time)  Medications Ordered in ED Medications  sodium chloride flush (NS) 0.9 % injection 3 mL (has no administration in time range)  etomidate (AMIDATE) injection (30 mg Intravenous Given 04/08/2020 2008)  succinylcholine (ANECTINE) injection (150 mg Intravenous Given 04/04/2020 2008)  propofol (DIPRIVAN) 1000 MG/100ML infusion (30 mcg/kg/min  102.1 kg Intravenous New Bag/Given 03/14/2020 2013)  sodium chloride 3% (hypertonic) IV bolus 250 mL (has no administration in time range)    ED Course  I have reviewed the triage vital signs and the nursing notes.  Pertinent labs & imaging results that were available during my care of the patient were reviewed by me and considered in my medical decision making (see chart for details).    MDM Rules/Calculators/A&P                         Iv ns. Continuous pulse ox and cardiac monitoring. Code stroke activation. Neurology consulted and at bedside. Resp therapy consulted.   Pt emergently intubated for airway protection/breathing.   Reviewed nursing notes and prior charts for additional history.  Recent neurology visit - note made of 'hopefully able to come off anticoag therapy soon, due to history/risk of intracranial hem'.  CXR reviewed/interpreted by me  - ETT ok.   Labs reviewed/interpreted by me - k  mildly low.   CT  Reviewed/interpreted by me - + intracranial/intranparenchymal hem.  MDM Number of Diagnoses or Management Options   Amount and/or Complexity of Data Reviewed Clinical lab tests: ordered and reviewed Tests in the radiology section of CPT: ordered and reviewed  Tests in the medicine section of CPT: ordered and reviewed Discussion of test results with the performing providers: yes Decide to obtain previous medical records or to obtain history from someone other than the patient: yes Obtain history from someone other than the patient: yes Review and summarize past medical records: yes Discuss the patient with other providers: yes Independent visualization of images, tracings, or specimens: yes  Risk of Complications, Morbidity, and/or Mortality Presenting problems: high Diagnostic procedures: high Management options: high  neuro rechecks post ct - no change from initial exam.  Critical care consulted - will see in ED.   Neurology and critical care, based on exam, and extensive IC hem, and with shared decision making from family, decide on terminal extubation.   CRITICAL CARE RE: acute/severe alteration in mental status, extensive intracranial/intraparenchymal on anticoagulation therapy.  Performed by: Mirna Mires Total critical care time: 75 minutes Critical care time was exclusive of separately billable procedures and treating other patients. Critical care was necessary to treat or prevent imminent or life-threatening deterioration. Critical care was time spent personally by me on the following activities: development of treatment plan with patient and/or surrogate as well as nursing, discussions with consultants, evaluation of patient's response to treatment, examination of patient, obtaining history from patient or surrogate, ordering and performing treatments and interventions, ordering and review of laboratory studies, ordering and review of radiographic  studies, pulse oximetry and re-evaluation of patient's condition.         Final Clinical Impression(s) / ED Diagnoses Final diagnoses:  None    Rx / DC Orders ED Discharge Orders    None       Lajean Saver, MD 03/14/2020 [id number]

## 2020-03-12 NOTE — ED Notes (Signed)
Family at bedside, pt t be comfort care

## 2020-03-12 NOTE — Code Documentation (Signed)
Stroke Response Nurse Documentation Code Documentation  DOMINYCK RESER is a 74 y.o. male arriving to [facility] ED via [facility] on 12/4 with past medical hx of Afib on Eliquis, prev ICH, seizures, HTN, HLD . Code stroke was activated by EMS. Patient from home where he was LKW at 1800 and now complaining of unresponsiveness . On Eliquis (apixaban) daily. Stroke team at the bedside on patient arrival. Labs drawn and patient cleared for CT by Dr. Ashok Cordia. Patient to CT with team. NIHSS 33, see documentation for details and code stroke times. Patient with decreased LOC, disoriented, not following commands, bilateral hemianopia, bilateral arm weakness, bilateral leg weakness, bilateral limb ataxia, Global aphasia  and dysarthria  on exam. The following imaging was completed:  CT. Patient is not a candidate for tPA due to [place]. Pt initially intubated and supported. After discussion with the family, pt will be transitioned to comfort care with possible extubation. Bedside handoff with ED RN Claiborne Billings.    Madelynn Done  Rapid Response RN

## 2020-03-12 NOTE — ED Notes (Signed)
Returned from ct. Family updated by Neuro. celviprex was started in ct for bp management.

## 2020-03-12 NOTE — Code Documentation (Signed)
3% ns BOLUS 250cc given

## 2020-03-12 NOTE — Consult Note (Deleted)
NAME:  Ronald [name], MRN:  [id number], DOB:  [birth year], LOS: 0 ADMISSION DATE:  04/06/2020, CONSULTATION DATE:  14/4/21 REFERRING MD:  Lajean Saver, CHIEF COMPLAINT:  Altered Mental Status  Brief History   This is a 74 yo male with a history of HTN, HLD, A fib, COPD, left frontal hemorrhagic stroke, unprovoked PE and seizure disorder who presented this evening with altered mental status found to have large left frontal lobe and basal ganglia hemorrhage.   History of present illness   This is a 74 yo male with a history of HTN, HLD, A fib, COPD, left frontal hemorrhagic stroke, unprovoked PE and seizure disorder who presented this evening with altered mental status found to have large left frontal lobe and basal ganglia hemorrhage. Patient had headache this evening. Short thereafter began to have nausea and vomiting and than become unresponsive. Came into the ED with GCS of 4-5. Promplty intubated and sen to CT scanner. In Ct patient found to have  left frontal lobe and basal ganglia hemorrhage  Past Medical History  HTN HLd Atrial fibrillation status post watchman recently still on anticoagulation COPD Unprovoked pulmonary embolus     Significant Hospital Events   Patient intubated on March 12, 2020  Consults:  PCCM  Neurology  Procedures:  Intubation  Significant Diagnostic Tests:  CT head demonstrating large hemorrhagic stroke  IMPRESSION: Very large acute parenchymal hemorrhage centered within the left frontal lobe and left basal ganglia measuring 8.6 x 7.5 cm. Overlying moderate volume acute subarachnoid hemorrhage. Moderate to large volume intraventricular extension of hemorrhage.  Severe mass effect with severe effacement of the left lateral ventricle and 2.1 cm rightward midline shift. Rightward subfalcine herniation.  Asymmetric prominence of the right lateral ventricle compatible with entrapment.    Micro Data:  Not applicable  Antimicrobials:    Not applicable  Interim history/subjective:  Not applicable  Objective   Blood pressure 131/76, pulse (!) 57, temperature 97.8 F (36.6 C), temperature source Temporal, resp. rate (!) 21, SpO2 94 %.    Vent Mode: PRVC FiO2 (%):  [100 %] 100 % Set Rate:  [18 bmp] 18 bmp Vt Set:  [580 mL] 580 mL PEEP:  [5 cmH20] 5 cmH20 Plateau Pressure:  [18 cmH20] 18 cmH20   Intake/Output Summary (Last 24 hours) at 03/14/2020 2148 Last data filed at 04/05/2020 2139 Gross per 24 hour  Intake 45.64 ml  Output --  Net 45.64 ml   There were no vitals filed for this visit.  Examination: General: Patient intubated and sedated HENT: Pinpoint pupils bilterally Lungs: Mechanical breath sounds Cardiovascular: regular rate and rhythm Abdomen: Sof non tender and non distended Extremities: No focal deformitites Neuro: no arousal to voice or stimulation. Will trigger vent intermittently. No gag noted GU: Condom catheter in place  Resolved Hospital Problem list   Not applicable  Assessment & Plan:  This is a 74 year old with history as noted above who presented for altered mental status after an episode of headache followed by nausea and vomiting.  Found to have large frontal hemorrhage.  PCCM consulted to help in end-of-life discussions.  Hemorrhagic stroke-noted on head CT to be in the left frontal lobe and left basal ganglia.  This would be devastating.  Wife aware.  She notes that patient would never want to live like this if he were to have significant impairment.  After discussion with neurologist myself it is apparent that patient would have a significant impact in quality of life with this devastating  stroke.  With this in mind wife has elected to make patient comfort care.  She is waiting on to relatives to see patient.  At which point we will initiate compassionate extubation. -Comfort care orders placed -We will give bolus of morphine and Ativan and also glycopyrrolate prior to  extubation -Wife aware that patient will likely pass shortly after compassionate extubation    Best practice (evaluated daily)   Diet: NA Pain/Anxiety/Delirium protocol (if indicated): NA VAP protocol (if indicated): NA DVT prophylaxis: NA GI prophylaxis: NA Glucose control: NA Mobility: NA Family and staff present Wife at bedside and is decision maker Summary of discussion Patient to be made comfort care with compassionate extubation Code Status: DNR-CC   Labs   CBC: Recent Labs  Lab 03/09/2020 2007 04/05/2020 2009  WBC  --  11.3*  NEUTROABS  --  8.1*  HGB 15.3 15.4  HCT 45.0 46.3  MCV  --  97.3  PLT  --  207    Basic Metabolic Panel: Recent Labs  Lab 03/09/2020 2007 03/24/2020 2009  NA 138 138  K 3.1* 3.2*  CL 99 100  CO2  --  24  GLUCOSE 233* 243*  BUN 22 19  CREATININE 1.40* 1.44*  CALCIUM  --  8.6*   GFR: Estimated Creatinine Clearance: 53.9 mL/min (A) (by C-G formula based on SCr of 1.44 mg/dL (H)). Recent Labs  Lab 03/15/2020 2009  WBC 11.3*    Liver Function Tests: Recent Labs  Lab 03/20/2020 2009  AST 18  ALT 16  ALKPHOS 71  BILITOT 0.8  PROT 5.9*  ALBUMIN 3.7   No results for input(s): LIPASE, AMYLASE in the last 168 hours. No results for input(s): AMMONIA in the last 168 hours.  ABG    Component Value Date/Time   PHART 7.486 (H) 01/04/2019 1200   PCO2ART 38.0 01/04/2019 1200   PO2ART 53.6 (L) 01/04/2019 1200   HCO3 28.4 (H) 01/04/2019 1200   TCO2 25 03/18/2020 2007   O2SAT 88.9 01/04/2019 1200     Coagulation Profile: Recent Labs  Lab 03/25/2020 2009  INR 1.1    Cardiac Enzymes: No results for input(s): CKTOTAL, CKMB, CKMBINDEX, TROPONINI in the last 168 hours.  HbA1C: Hemoglobin A1C  Date/Time Value Ref Range Status  05/19/2019 10:01 AM 5.9 (A) 4.0 - 5.6 % Final   Hgb A1c MFr Bld  Date/Time Value Ref Range Status  12/18/2019 06:44 AM 6.3 (H) 4.8 - 5.6 % Final    Comment:    (NOTE) Pre diabetes:           5.7%-6.4%  Diabetes:              >6.4%  Glycemic control for   <7.0% adults with diabetes   02/16/2019 10:37 AM 6.0 4.6 - 6.5 % Final    Comment:    Glycemic Control Guidelines for People with Diabetes:Non Diabetic:  <6%Goal of Therapy: <7%Additional Action Suggested:  >8%     CBG: No results for input(s): GLUCAP in the last 168 hours.  Review of Systems:   Unable to obtain.  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   Surgical History    Past Surgical History:  Procedure Laterality Date  . APPENDECTOMY  1967  . BACK SURGERY  2005  . CARDIOVERSION N/A 05/07/2014   Procedure: CARDIOVERSION;  Surgeon: [name], MD;  Location: [facility] ENDOSCOPY;  Service: Cardiovascular;  Laterality: N/A;  . CARDIOVERSION N/A 09/09/2014   Procedure: CARDIOVERSION;  Surgeon: Lelon Perla, MD;  Location: [facility] ENDOSCOPY;  Service: Cardiovascular;  Laterality: N/A;  successfully  . CATARACT EXTRACTION W/ INTRAOCULAR LENS  IMPLANT, BILATERAL  08 and 09/  2018  . COLONOSCOPY  last one 06-07-2011  . EYE SURGERY Bilateral 2018   cataract  . GOLD SEED IMPLANT N/A 09/09/2017   Procedure: GOLD SEED IMPLANT;  Surgeon: [name], MD;  Location:  [facility];  Service: Urology;  Laterality: N/A;  . HYDROCELE EXCISION Bilateral 09/26/2015   Procedure: HYDROCELECTOMY ADULT;  Surgeon: [name], MD;  Location: [facility];  Service: Urology;  Laterality: Bilateral;  . LAMINECTOMY AND MICRODISCECTOMY LUMBAR SPINE  12-23-2003   Left L5 -- S1  . LAPAROSCOPIC BILATERAL INGUINAL HERNIA REPAIR/  UMBILICAL HERNIA REPAIR WITH MESH/  ASPIRATION LEFT HYDROCELE  07-11-2012  . LEFT ATRIAL APPENDAGE OCCLUSION N/A 02/11/2020   Procedure: LEFT ATRIAL APPENDAGE OCCLUSION;  Surgeon: Vickie Epley, MD;  Location: [facility];  Service: Cardiovascular;  Laterality: N/A;  . LEFT ATRIAL APPENDAGE OCCLUSION    . NM MYOVIEW LTD  05/18/2014   Low risk study. Normal perfusion: No ischemia or infarction. Mild LV dysfunction - 46% (does not correlate with echocardiographic EF of 50-55%)  . PROSTATE BIOPSY  05/15/12   Clinically both Lobes  . SPACE OAR INSTILLATION N/A 09/09/2017   Procedure: SPACE OAR INSTILLATION;  Surgeon: [name], MD;  Location: [facility];  Service: Urology;  Laterality: N/A;  . TEE WITHOUT CARDIOVERSION N/A 05/07/2014   Procedure: TRANSESOPHAGEAL ECHOCARDIOGRAM (TEE);  Surgeon: [name], MD;  Location: [facility] ENDOSCOPY;  Service: Cardiovascular;  Laterality: N/A;   mild atherosclerosis plaque of aorta,  mild AR, MR, and TR,  no cardiac source of emboli  . TONSILLECTOMY  as child  . TRANSTHORACIC ECHOCARDIOGRAM  11/'18; 1/'19   a) In setting of sepsis: EF of 40-45%.  Diffuse hypokinesis.  No RWMA.  Biatrial enlargement.;; b) ** f/u Jan 2019: Normal LVF 55-60%** , mildly dilated aortic root(38 mm) and ascending aorta (44 mm). Compared to prior echo, LVEF has improved.  . TRANSTHORACIC ECHOCARDIOGRAM  7/'20; 9/'20   a) Hemorrhagic CVA/SAH - EF> 65%.  Moderate concentric LVH.-GR 1 DD mild RA dilation.; b) EF 60 to 65%.  Mild LVH.  GRII DD.  Normal RV size and function.  Mild LA  dilation.  Normal RA size.  Unable to assess PA pressures.  No significant aortic sclerosis.  . TRANSURETHRAL RESECTION OF PROSTATE N/A 05/30/2017   Procedure: TRANSURETHRAL RESECTION OF THE PROSTATE (TURP);  Surgeon: [name], MD;  Location: [facility] ORS;  Service: Urology;  Laterality: N/A;     Social History   reports that he has been smoking cigarettes. He has a 60.00 pack-year smoking history. He has never used smokeless tobacco. He reports previous alcohol use. He reports that he does  not use drugs.   Family History   His family history includes Breast cancer in his mother; Ovarian cancer in his sister; Stroke in his mother and another family member. There is no history of Colon cancer, Esophageal cancer, Rectal cancer, or Stomach cancer.   Allergies No Known Allergies   Home Medications  Prior to Admission medications   Medication Sig Start Date End Date Taking? Authorizing Provider  amiodarone (PACERONE) 200 MG tablet Take 1 tablet (200 mg total) by mouth 2 (two) times daily. 01/21/20   Vickie Epley, MD  apixaban (ELIQUIS) 5 MG TABS tablet Take 1 tablet (5 mg total) by mouth 2 (two) times daily. 01/21/20   Vickie Epley, MD  ascorbic acid (VITAMIN C) 500 MG tablet Take 500 mg by mouth daily.    [provider]  aspirin EC 81 MG EC tablet Take 1 tablet (81 mg total) by mouth daily. Swallow whole. 12/23/19   Donzetta Starch, NP  atorvastatin (LIPITOR) 10 MG tablet TAKE 1 TABLET BY MOUTH DAILY EACH EVENING 01/16/19   Burchette, Alinda Sierras, MD  Blood Glucose Monitoring Suppl (ACCU-CHEK AVIVA PLUS) w/Device KIT Use blood glucose machine as instructed on your strips 12/12/18   Angiulli, Lavon Paganini, PA-C  carvedilol (COREG) 6.25 MG tablet Take 1 tablet (6.25 mg total) by mouth 2 (two) times daily with a meal. 11/13/19   Burchette, Alinda Sierras, MD  Cholecalciferol (VITAMIN D-3) 25 MCG (1000 UT) CAPS Take 1,000 Units by mouth daily.     [provider]  colchicine 0.6 MG  tablet TAKE 1 TABLET (0.6 MG TOTAL) BY MOUTH 2 (TWO) TIMES DAILY AS NEEDED (GOUT FLARE). 02/22/20   Burchette, Alinda Sierras, MD  divalproex (DEPAKOTE) 500 MG DR tablet TAKE 1 TABLET BY MOUTH EVERY 12 HOURS 11/30/19   Dohmeier, Asencion Partridge, MD  furosemide (LASIX) 40 MG tablet TAKE 1 TABLET (40 MG TOTAL) BY MOUTH 2 (TWO) TIMES DAILY WITH BREAKFAST AND LUNCH. 02/22/20   Burchette, Alinda Sierras, MD  glucose blood (ACCU-CHEK AVIVA PLUS) test strip Test 2 times daily dx e11.9 12/12/18   Angiulli, Lavon Paganini, PA-C  Lancets (ACCU-CHEK SOFT TOUCH) lancets Check blood sugars once per day. DX: E11.9 12/12/18   Angiulli, Lavon Paganini, PA-C  losartan (COZAAR) 100 MG tablet Take 1 tablet (100 mg total) by mouth daily. 01/21/20 04/20/20  Vickie Epley, MD  metFORMIN (GLUCOPHAGE) 500 MG tablet Take 500 mg by mouth every evening.    [provider]  montelukast (SINGULAIR) 10 MG tablet TAKE 1 TABLET BY MOUTH EVERYDAY AT BEDTIME 01/01/20   Burchette, Alinda Sierras, MD  MYRBETRIQ 25 MG TB24 tablet Take 25 mg by mouth daily. 05/11/19   [provider]  PRESCRIPTION MEDICATION CPAP- At bedtime    [provider]     Critical care time: 55 minutes

## 2020-03-12 NOTE — Consult Note (Signed)
Neurology Consultation Reason for Consult: Unresponsiveness  Requesting Physician: Lajean Saver   CC: Headache followed by unresponsiveness   History is obtained from: EMS, wife and chart review   HPI: ALFONZO ARCA is a 74 y.o. male with a past medical history significant for atrial fibrillation, unprovoked PE, COPD, ongoing smoking, hypertension, hyperlipidemia, presenting with acute unresponsiveness.  I last met the patient on 12/17/2019 when he had had a smaller intracerebral hemorrhage.  Anticoagulation was discontinued.  He had a watchman procedure on February 11, 2020 with plans for TEE on 12/22 to confirm proper seal at which point Eliquis was planned to be discontinued with aspirin and Plavix continued for 6 months.  Unfortunately at around 6:30 PM on 12/4 he began to have a severe headache and emesis after which she was found unresponsive in the bathroom by his wife.  EMS was activated.  On initial evaluation he was noted to be paralyzed on the right side with some weakness on the left side.  Per EMS, family reported his last dose of Eliquis was at 11 AM.  On official evaluation he was not protecting his airway and noted to be hypertensive (210/108) and bradycardic to the 50s.  He was emergently intubated and hypertonic saline was started based on his exam and clinical history and presentation.  LKW: 18:30 p.m. tPA given?: No, or if yes, time given due to [place]  ICH Score: 4  Time performed:   GCS: 3-4 is 2 points Infratentorial: No.. If yes, 1 point -- 0 Volume: >30cc is 1 point -- 1  Age: 74 y.o.. >80 is 1 point -- 0  Intraventricular extension is 1 point Score: 4 A Score of 4 points has a 30 day mortality of 97%. Stroke. 2001 Apr;32(4):891-7.  ROS: Unable to obtain due to altered mental status.   Past Medical History:  Diagnosis Date  . Allergic rhinitis   . Anticoagulant long-term use    eliquis  . Atrial fibrillation ([place])   . COPD with emphysema [facility])     pulmologist-  dr Halford Chessman  . History of colon polyps    tubular adenoma 2013  . History of gout    09-05-2017 last flare-up  05/ 2019 3 wks ago, feet  . History of sepsis 02/18/2017   per d/c note probable uti, acute chf, acute renal failure, hypoxia  . History of YAG laser capsulotomy of lens December 2020, January 2021  . Hyperlipidemia   . Hyperplasia of prostate with lower urinary tract symptoms (LUTS)   . Hypertension   . OSA on CPAP    per study 08-03-2004  Severe OSA  . Persistent atrial fibrillation [facility])    cardiologist --  dr Dorris Carnes--  post cardioversion 05-07-2014  . Prostate cancer [facility]) urologsit-  dr Alyson Ingles--- as of 05-21-2017 per pt last PSA 11 approx.   Dx  2014--  stage T1c, Gleason 3+3=6, PSA 6.67--  Active surveillance/  04/ 2019  Stage T1b, Gleason 3+4, PSA 12.8- plan external radiation therapy    . 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 Seasonal allergies   . Sigmoid diverticulosis   . Stroke ([place]) 09/09/021  . Systolic and diastolic CHF, chronic [facility])    cardiologist-  [name]  . Tinea versicolor   . Type 2 diabetes mellitus ([place])   . Wears hearing aid in both ears    Current Outpatient Medications  Medication Instructions  . amiodarone (PACERONE) 200 mg,  Oral, 2 times daily  . apixaban (ELIQUIS) 5 mg, Oral, 2 times daily  . ascorbic acid (VITAMIN C) 500 mg, Oral, Daily  . aspirin 81 mg, Oral, Daily, Swallow whole.  Marland Kitchen atorvastatin (LIPITOR) 10 MG tablet TAKE 1 TABLET BY MOUTH DAILY EACH EVENING  . Blood Glucose Monitoring Suppl (ACCU-CHEK AVIVA PLUS) w/Device KIT Use blood glucose machine as instructed on your strips  . carvedilol (COREG) 6.25 mg, Oral, 2 times daily with meals  . colchicine 0.6 mg, Oral, 2 times daily PRN  . divalproex (DEPAKOTE) 500 MG DR tablet TAKE 1 TABLET BY MOUTH EVERY 12 HOURS  . furosemide (LASIX) 40 mg, Oral, 2 times daily with breakfast and lunch  . glucose blood  (ACCU-CHEK AVIVA PLUS) test strip Test 2 times daily dx e11.9  . Lancets (ACCU-CHEK SOFT TOUCH) lancets Check blood sugars once per day. DX: E11.9  . losartan (COZAAR) 100 mg, Oral, Daily  . metFORMIN (GLUCOPHAGE) 500 mg, Oral, Every evening  . montelukast (SINGULAIR) 10 MG tablet TAKE 1 TABLET BY MOUTH EVERYDAY AT BEDTIME  . Myrbetriq 25 mg, Oral, Daily  . PRESCRIPTION MEDICATION CPAP- At bedtime  . Vitamin D-3 1,000 Units, Oral, Daily    Family History  Problem Relation Age of Onset  . Breast cancer Mother   . Stroke Mother   . Stroke Other        Unknown   . Ovarian cancer Sister   . Colon cancer Neg Hx   . Esophageal cancer Neg Hx   . Rectal cancer Neg Hx   . Stomach cancer Neg Hx    Social History:  reports that he has been smoking cigarettes. He has a 60.00 pack-year smoking history. He has never used smokeless tobacco. He reports previous alcohol use. He reports that he does not use drugs.   Exam: Current vital signs: BP (!) 188/98   Pulse 64   Temp 97.8 F (36.6 C) (Temporal)   Resp 18   SpO2 96%  Vital signs in last 24 hours: Temp:  [97.8 F (36.6 C)] 97.8 F (36.6 C) (12/04 2015) Pulse Rate:  [58-79] 64 (12/04 2015) Resp:  [18-30] 18 (12/04 2016) BP: (188-232)/(98-118) 188/98 (12/04 2016) SpO2:  [60 %-96 %] 96 % (12/04 2016)   Physical Exam  Constitutional: Appears well-developed and well-nourished.  Psych: Not interactive Eyes: No scleral injection HENT: Tongue laceration MSK: no joint deformities.  Cardiovascular: Irregular rhythm, perfusing extremities well Respiratory: Sonorous breathing GI: Soft.  No distension.  Skin: WDI  Neuro: Mental Status/sensorimotor: Patient is obtunded.  To maximal noxious stimulation in all 4 extremities, there is slight movement of the right upper extremity that cannot be completely characterized.  There is triple flexion of the bilateral lower extremities and extensor posturing of the left upper extremity. Cranial  Nerves: II: Pupils are fixed, right is 2 mm, left is 3 mm III,IV, VI, VIII: VOR is sluggish and incomplete V/VII: Corneals are intact bilaterally  X/XI: Gag was not assessed, cough is intact  NIHSS total 31 Score breakdown:  3 points for coma, 2 points for being unable to answer questions, 2 points for being unable to follow commands, 1 point for partial gaze palsy, 3 points for blindness, 3 points for complete facial paresis, 3 points for left arm, 3 points for right arm, 3 points for left leg, 3 points for right leg, 3 points for mute language, 2 points for dysarthria   I have reviewed labs in epic and the results pertinent to  this consultation are: Creatinine 1.44, glucose 240s, platelets 207, PT 13.5, INR 1.1  I have reviewed the images obtained: With acute intraparenchymal hemorrhage of the left basal ganglia/frontal lobe with extension into the intraventricular system and casting of the third/fourth ventricle  Impression: This is a 74 year old gentleman who suffered his third recent intracerebral hemorrhage.  During his last admission I had discussed goals of care with his wife, who had indicated that he would be full code unless he would be expected to have severe disabilities with little chance of meaningful interactions with family.  We discussed that given the location and size of his hemorrhage, chances of meaningful recovery were exceedingly low.  Wife expressed that at this point the patient's wishes would be to focus on comfort  Recommendations: -Normal saline on my initial recommendations prior to scan -Blood pressure control with Cleviprex on my initial recommendations while discussing goals of care with family  Prognosis discussed with wife at bedside. Decision made to proceed with comfort care.  Family gathering and plan to be at bedside at ~21:30 with plan for palliative extubation at the time.   I placed orders for comfort care including morphine, Robinul for secretions,  chaplain consult  60 minutes of critical care time provided including discussions with wife, ED provider, chaplain, critical care medicine, neuroradiology  [place] [phone]

## 2020-03-12 NOTE — ED Notes (Signed)
4mg  bolus morphine given per PCCM

## 2020-03-12 NOTE — ED Notes (Signed)
Pt time of death 2309, confirmed by 2 RN, Danae Chen S and Fahmida Jurich g. Paged provider

## 2020-03-12 NOTE — ED Notes (Signed)
Pt extubated by respiratory, family remains at bedside with chaplin

## 2020-03-12 NOTE — ED Notes (Signed)
RT notified for extubation per PCCM

## 2020-03-12 NOTE — Procedures (Signed)
Extubation Procedure Note  Patient Details:   Name: Ronald [name] DOB: April 24, 1945 MRN: [id number]          Airway Documentation:    Vent end date: 04/05/2020 Vent end time: 2152   Evaluation  O2 sats: transiently fell during during procedure Complications: No apparent complications Patient did not tolerate procedure well. Bilateral Breath Sounds: Rhonchi, Diminished   No   Terminal extuabtion   [name] R [place] 03/26/2020, 9:55 PM

## 2020-03-12 NOTE — H&P (Deleted)
NAME:  Ronald [name], MRN:  [id number], DOB:  Nov 07, 1945, LOS: 0 ADMISSION DATE:  04/01/2020, CONSULTATION DATE:  14/4/21 REFERRING MD:  Lajean Saver, CHIEF COMPLAINT:  Altered Mental Status  Brief History   This is a 74 yo male with a history of HTN, HLD, A fib, COPD, left frontal hemorrhagic stroke, unprovoked PE and seizure disorder who presented this evening with altered mental status found to have large left frontal lobe and basal ganglia hemorrhage.   History of present illness   This is a 74 yo male with a history of HTN, HLD, A fib, COPD, left frontal hemorrhagic stroke, unprovoked PE and seizure disorder who presented this evening with altered mental status found to have large left frontal lobe and basal ganglia hemorrhage. Patient had headache this evening. Short thereafter began to have nausea and vomiting and than become unresponsive. Came into the ED with GCS of 4-5. Promplty intubated and sen to CT scanner. In Ct patient found to have  left frontal lobe and basal ganglia hemorrhage  Past Medical History  HTN HLd Atrial fibrillation status post watchman recently still on anticoagulation COPD Unprovoked pulmonary embolus     Significant Hospital Events   Patient intubated on March 12, 2020  Consults:  PCCM  Neurology  Procedures:  Intubation  Significant Diagnostic Tests:  CT head demonstrating large hemorrhagic stroke  IMPRESSION: Very large acute parenchymal hemorrhage centered within the left frontal lobe and left basal ganglia measuring 8.6 x 7.5 cm. Overlying moderate volume acute subarachnoid hemorrhage. Moderate to large volume intraventricular extension of hemorrhage.  Severe mass effect with severe effacement of the left lateral ventricle and 2.1 cm rightward midline shift. Rightward subfalcine herniation.  Asymmetric prominence of the right lateral ventricle compatible with entrapment.    Micro Data:  Not applicable  Antimicrobials:    Not applicable  Interim history/subjective:  Not applicable  Objective   Blood pressure (!) 147/84, pulse 61, temperature 97.8 F (36.6 C), temperature source Temporal, resp. rate 14, SpO2 93 %.       No intake or output data in the 24 hours ending 03/15/2020 2046 There were no vitals filed for this visit.  Examination: General: Patient intubated and sedated HENT: Pinpoint pupils bilterally Lungs: Mechanical breath sounds Cardiovascular: regular rate and rhythm Abdomen: Sof non tender and non distended Extremities: No focal deformitites Neuro: no arousal to voice or stimulation. Will trigger vent intermittently. No gag noted GU: Condom catheter in place  Resolved Hospital Problem list   Not applicable  Assessment & Plan:  This is a 74 year old with history as noted above who presented for altered mental status after an episode of headache followed by nausea and vomiting.  Found to have large frontal hemorrhage.  PCCM consulted to help in end-of-life discussions.  Hemorrhagic stroke-noted on head CT to be in the left frontal lobe and left basal ganglia.  This would be devastating.  Wife aware.  She notes that patient would never want to live like this if he were to have significant impairment.  After discussion with neurologist myself it is apparent that patient would have a significant impact in quality of life with this devastating hemorrhagic stroke.  With this in mind wife has elected to make patient comfort care.  She is waiting on to relatives to see patient.  At which point we will initiate compassionate extubation. -Comfort care orders placed -We will give bolus of morphine and Ativan and also glycopyrrolate prior to extubation -Wife aware that patient  will likely pass shortly after compassionate extubation    Best practice (evaluated daily)   Diet: NA Pain/Anxiety/Delirium protocol (if indicated): NA VAP protocol (if indicated): NA DVT prophylaxis: NA GI prophylaxis:  NA Glucose control: NA Mobility: NA Family and staff present Wife at bedside and is decision maker Summary of discussion Patient to be made comfort care with compassionate extubation Code Status: DNR-CC   Labs   CBC: Recent Labs  Lab 04/08/2020 2007 03/25/2020 2009  WBC  --  11.3*  NEUTROABS  --  8.1*  HGB 15.3 15.4  HCT 45.0 46.3  MCV  --  97.3  PLT  --  343    Basic Metabolic Panel: Recent Labs  Lab 04/06/2020 2007  NA 138  K 3.1*  CL 99  GLUCOSE 233*  BUN 22  CREATININE 1.40*   GFR: Estimated Creatinine Clearance: 55.4 mL/min (A) (by C-G formula based on SCr of 1.4 mg/dL (H)). Recent Labs  Lab 03/23/2020 2009  WBC 11.3*    Liver Function Tests: No results for input(s): AST, ALT, ALKPHOS, BILITOT, PROT, ALBUMIN in the last 168 hours. No results for input(s): LIPASE, AMYLASE in the last 168 hours. No results for input(s): AMMONIA in the last 168 hours.  ABG    Component Value Date/Time   PHART 7.486 (H) 01/04/2019 1200   PCO2ART 38.0 01/04/2019 1200   PO2ART 53.6 (L) 01/04/2019 1200   HCO3 28.4 (H) 01/04/2019 1200   TCO2 25 03/19/2020 2007   O2SAT 88.9 01/04/2019 1200     Coagulation Profile: Recent Labs  Lab 03/11/2020 2009  INR 1.1    Cardiac Enzymes: No results for input(s): CKTOTAL, CKMB, CKMBINDEX, TROPONINI in the last 168 hours.  HbA1C: Hemoglobin A1C  Date/Time Value Ref Range Status  05/19/2019 10:01 AM 5.9 (A) 4.0 - 5.6 % Final   Hgb A1c MFr Bld  Date/Time Value Ref Range Status  12/18/2019 06:44 AM 6.3 (H) 4.8 - 5.6 % Final    Comment:    (NOTE) Pre diabetes:          5.7%-6.4%  Diabetes:              >6.4%  Glycemic control for   <7.0% adults with diabetes   02/16/2019 10:37 AM 6.0 4.6 - 6.5 % Final    Comment:    Glycemic Control Guidelines for People with Diabetes:Non Diabetic:  <6%Goal of Therapy: <7%Additional Action Suggested:  >8%     CBG: No results for input(s): GLUCAP in the last 168 hours.  Review of Systems:     Unable to obtain.  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   Surgical History    Past Surgical History:  Procedure Laterality Date  . APPENDECTOMY  1967  . BACK SURGERY  2005  . CARDIOVERSION N/A 05/07/2014   Procedure: CARDIOVERSION;  Surgeon: [name], MD;  Location:  [facility];  Service: Cardiovascular;  Laterality: N/A;  . CARDIOVERSION N/A 09/09/2014   Procedure: CARDIOVERSION;  Surgeon: Lelon Perla, MD;  Location: [facility];  Service: Cardiovascular;  Laterality: N/A;  successfully  . CATARACT EXTRACTION W/ INTRAOCULAR LENS  IMPLANT, BILATERAL  08 and 09/  2018  . COLONOSCOPY  last one 06-07-2011  . EYE SURGERY Bilateral 2018   cataract  . GOLD SEED IMPLANT N/A 09/09/2017   Procedure: GOLD SEED IMPLANT;  Surgeon: [name], MD;  Location: [facility];  Service: Urology;  Laterality: N/A;  . HYDROCELE EXCISION Bilateral 09/26/2015   Procedure: HYDROCELECTOMY ADULT;  Surgeon: [name], MD;  Location: [facility];  Service: Urology;  Laterality: Bilateral;  . LAMINECTOMY AND MICRODISCECTOMY LUMBAR SPINE  12-23-2003   Left L5 -- S1  . LAPAROSCOPIC BILATERAL INGUINAL  HERNIA REPAIR/  UMBILICAL HERNIA REPAIR WITH MESH/  ASPIRATION LEFT HYDROCELE  07-11-2012  . LEFT ATRIAL APPENDAGE OCCLUSION N/A 02/11/2020   Procedure: LEFT ATRIAL APPENDAGE OCCLUSION;  Surgeon: Vickie Epley, MD;  Location: [facility];  Service: Cardiovascular;  Laterality: N/A;  . LEFT ATRIAL APPENDAGE OCCLUSION    . NM MYOVIEW LTD  05/18/2014   Low risk study. Normal perfusion: No ischemia or infarction. Mild LV dysfunction - 46% (does not correlate with echocardiographic EF of 50-55%)  . PROSTATE BIOPSY  05/15/12   Clinically both Lobes  . SPACE OAR INSTILLATION N/A 09/09/2017   Procedure: SPACE OAR INSTILLATION;  Surgeon: [name], MD;  Location: [facility];  Service: Urology;  Laterality: N/A;  . TEE WITHOUT CARDIOVERSION N/A 05/07/2014   Procedure: TRANSESOPHAGEAL ECHOCARDIOGRAM (TEE);  Surgeon: [name], MD;  Location: [facility];  Service: Cardiovascular;  Laterality: N/A;   mild atherosclerosis plaque of aorta,  mild AR, MR, and TR,  no cardiac source of emboli  . TONSILLECTOMY  as child  . TRANSTHORACIC ECHOCARDIOGRAM  11/'18; 1/'19   a) In setting of sepsis: EF of 40-45%.  Diffuse hypokinesis.  No RWMA.  Biatrial enlargement.;; b) ** f/u Jan 2019: Normal LVF 55-60%** , mildly dilated aortic root(38 mm) and ascending aorta (44 mm). Compared to prior echo, LVEF has improved.  . TRANSTHORACIC ECHOCARDIOGRAM  7/'20; 9/'20   a) Hemorrhagic CVA/SAH - EF> 65%.  Moderate concentric LVH.-GR 1 DD mild RA dilation.; b) EF 60 to 65%.  Mild LVH.  GRII DD.  Normal RV size and function.  Mild LA dilation.  Normal RA size.  Unable to assess PA pressures.  No significant aortic sclerosis.  . TRANSURETHRAL RESECTION OF PROSTATE N/A 05/30/2017   Procedure: TRANSURETHRAL RESECTION OF THE PROSTATE (TURP);  Surgeon: [name], MD;  Location: [facility];  Service: Urology;  Laterality: N/A;     Social History   reports that he has been smoking cigarettes. He  has a 60.00 pack-year smoking history. He has never used smokeless tobacco. He reports previous alcohol use. He reports that he does not use drugs.   Family History   His family history includes Breast cancer in his mother; Ovarian cancer in his sister; Stroke in his mother and another family member. There is no history of Colon cancer, Esophageal cancer, Rectal cancer, or Stomach cancer.   Allergies No Known Allergies   Home Medications  Prior to Admission medications   Medication Sig Start Date End Date Taking? Authorizing Provider  amiodarone (PACERONE) 200 MG tablet Take 1 tablet (200 mg total) by mouth 2 (  two) times daily. 01/21/20   Vickie Epley, MD  apixaban (ELIQUIS) 5 MG TABS tablet Take 1 tablet (5 mg total) by mouth 2 (two) times daily. 01/21/20   Vickie Epley, MD  ascorbic acid (VITAMIN C) 500 MG tablet Take 500 mg by mouth daily.    [provider]  aspirin EC 81 MG EC tablet Take 1 tablet (81 mg total) by mouth daily. Swallow whole. 12/23/19   Donzetta Starch, NP  atorvastatin (LIPITOR) 10 MG tablet TAKE 1 TABLET BY MOUTH DAILY EACH EVENING 01/16/19   Burchette, Alinda Sierras, MD  Blood Glucose Monitoring Suppl (ACCU-CHEK AVIVA PLUS) w/Device KIT Use blood glucose machine as instructed on your strips 12/12/18   Angiulli, Lavon Paganini, PA-C  carvedilol (COREG) 6.25 MG tablet Take 1 tablet (6.25 mg total) by mouth 2 (two) times daily with a meal. 11/13/19   Burchette, Alinda Sierras, MD  Cholecalciferol (VITAMIN D-3) 25 MCG (1000 UT) CAPS Take 1,000 Units by mouth daily.     [provider]  colchicine 0.6 MG tablet TAKE 1 TABLET (0.6 MG TOTAL) BY MOUTH 2 (TWO) TIMES DAILY AS NEEDED (GOUT FLARE). 02/22/20   Burchette, Alinda Sierras, MD  divalproex (DEPAKOTE) 500 MG DR tablet TAKE 1 TABLET BY MOUTH EVERY 12 HOURS 11/30/19   Dohmeier, Asencion Partridge, MD  furosemide (LASIX) 40 MG tablet TAKE 1 TABLET (40 MG TOTAL) BY MOUTH 2 (TWO) TIMES DAILY WITH BREAKFAST AND LUNCH. 02/22/20   Burchette, Alinda Sierras, MD  glucose blood (ACCU-CHEK AVIVA PLUS) test strip Test 2 times daily dx e11.9 12/12/18   Angiulli, Lavon Paganini, PA-C  Lancets (ACCU-CHEK SOFT TOUCH) lancets Check blood sugars once per day. DX: E11.9 12/12/18   Angiulli, Lavon Paganini, PA-C  losartan (COZAAR) 100 MG tablet Take 1 tablet (100 mg total) by mouth daily. 01/21/20 04/20/20  Vickie Epley, MD  metFORMIN (GLUCOPHAGE) 500 MG tablet Take 500 mg by mouth every evening.    [provider]  montelukast (SINGULAIR) 10 MG tablet TAKE 1 TABLET BY MOUTH EVERYDAY AT BEDTIME 01/01/20   Burchette, Alinda Sierras, MD  MYRBETRIQ 25 MG TB24 tablet Take 25 mg by mouth daily. 05/11/19   [provider]  PRESCRIPTION MEDICATION CPAP- At bedtime    [provider]     Critical care time: 55 minutes

## 2020-03-12 NOTE — ED Triage Notes (Signed)
PT BIB EMS for code stroke, unresponsive. PT wife reports that around 1800 pt had a headache and threw up, went to the bathroom, and wife found him unresponsive on the bathroom floor. When EMS arrived pt threw up more, was unresponsive, and only has intermittent L arm purposeful movement.  Pt has a hx of stroke and hypertension and takes eliquis.  VS in EMS  210/80 Pulse 50 RR 24 94%  CBG 228

## 2020-03-12 NOTE — ED Notes (Signed)
Family at bedside. 

## 2020-03-12 NOTE — ED Notes (Signed)
PCCM at bedside. Increase morphine to 6mg , give 4mg  bolus, give additional ativan dose now and add scopolamine patch.

## 2020-03-12 NOTE — ED Notes (Signed)
Ct delayed d/t airway protection. Preparing for intubation

## 2020-03-12 NOTE — ED Notes (Signed)
Increase morphine drip to 4mg  per PCCM

## 2020-03-14 LAB — CBG MONITORING, ED: Glucose-Capillary: 241 mg/dL — ABNORMAL HIGH (ref 70–99)

## 2020-03-28 ENCOUNTER — Other Ambulatory Visit (HOSPITAL_COMMUNITY): Payer: Medicare Other

## 2020-03-29 ENCOUNTER — Ambulatory Visit: Payer: Medicare Other | Admitting: Cardiology

## 2020-03-30 ENCOUNTER — Ambulatory Visit (HOSPITAL_COMMUNITY): Admit: 2020-03-30 | Payer: Medicare Other | Admitting: Cardiovascular Disease

## 2020-03-30 SURGERY — ECHOCARDIOGRAM, TRANSESOPHAGEAL
Anesthesia: Monitor Anesthesia Care

## 2020-04-09 NOTE — Progress Notes (Signed)
Chaplain responded to page for end of life support for family.  Chaplain attended family off and on over a two-hour period.  Chaplain prayed with wife and daughter at bedside.  Later daughter paged requesting Chaplain return for prayer with her and her father.  Chaplain escorted family from hospital.

## 2020-04-09 NOTE — Death Summary Note (Signed)
NAME:  Ronald [name], MRN:  [id number], DOB:  04/01/1946, LOS: 0 ADMISSION DATE:  03/20/2020, CONSULTATION DATE:  14/4/21 REFERRING MD:  Lajean Saver, CHIEF COMPLAINT:  Altered Mental Status  DEATH SUMMARY  Brief History   This is a 75 yo male with a history of HTN, HLD, A fib, COPD, left frontal hemorrhagic stroke, unprovoked PE and seizure disorder who presented this evening with altered mental status found to have large left frontal lobe and basal ganglia hemorrhage.   History of present illness   This is a 75 yo male with a history of HTN, HLD, A fib, COPD, left frontal hemorrhagic stroke, unprovoked PE and seizure disorder who presented this evening with altered mental status found to have large left frontal lobe and basal ganglia hemorrhage. Patient had headache this evening. Short thereafter began to have nausea and vomiting and than become unresponsive. Came into the ED with GCS of 4-5. Promplty intubated and sen to CT scanner. In Ct patient found to have  left frontal lobe and basal ganglia hemorrhage  Death Summary Patient was compassionately extubated on 03/22/2020 with morphine drip in place and Ativan given. Time of death noted to be 25-Jun-2307 by bedsuide nursing.Patients with no heart sounds, breath sounds or brainstem reflexes on my exam.  Past Medical History  HTN HLd Atrial fibrillation status post watchman recently still on anticoagulation COPD Unprovoked pulmonary embolus     Significant Hospital Events   Patient intubated on March 12, 2020  Consults:  PCCM  Neurology  Procedures:  Intubation  Significant Diagnostic Tests:  CT head demonstrating large hemorrhagic stroke  IMPRESSION: Very large acute parenchymal hemorrhage centered within the left frontal lobe and left basal ganglia measuring 8.6 x 7.5 cm. Overlying moderate volume acute subarachnoid hemorrhage. Moderate to large volume intraventricular extension of hemorrhage.  Severe mass effect with  severe effacement of the left lateral ventricle and 2.1 cm rightward midline shift. Rightward subfalcine herniation.  Asymmetric prominence of the right lateral ventricle compatible with entrapment.    Micro Data:  Not applicable  Antimicrobials:  Not applicable  Interim history/subjective:  Not applicable  Objective   Blood pressure 131/76, pulse 82, temperature 97.8 F (36.6 C), temperature source Temporal, resp. rate 17, SpO2 (!) 46 %.    Vent Mode: PRVC FiO2 (%):  [100 %] 100 % Set Rate:  [18 bmp] 18 bmp Vt Set:  [580 mL] 580 mL PEEP:  [5 cmH20] 5 cmH20 Plateau Pressure:  [18 cmH20] 18 cmH20   Intake/Output Summary (Last 24 hours) at 03/26/2020 06/24/2321 Last data filed at 03/26/2020 Jun 24, 2252 Gross per 24 hour  Intake 57.18 ml  Output -  Net 57.18 ml   There were no vitals filed for this visit.  Examination: General: Patient cold and lifeless HENT: Ppupills blown with no response to light bilaterally Lungs: absent breath sounds Cardiovascular: Absent heart sounds   Resolved Hospital Problem list   Not applicable  Assessment & Plan:  This is a 75 year old with history as noted above who presented for altered mental status after an episode of headache followed by nausea and vomiting.  Found to have large frontal hemorrhage.  PCCM consulted to help in end-of-life discussions.  Hemorrhagic stroke-noted on head CT to be in the left frontal lobe and left basal ganglia.  This would be devastating.  Wife aware.  She notes that patient would never want to live like this if he were to have significant impairment.  After discussion with neurologist myself it is  apparent that patient would have a significant impact in quality of life with this devastating hemorrhagic stroke.  With this in mind wife has elected to make patient comfort care.  She is waiting on to relatives to see patient.  At which point we will initiate compassionate extubation. -Comfort care orders placed -We will  give bolus of morphine and Ativan and also glycopyrrolate prior to extubation -Wife aware that patient will likely pass shortly after compassionate extubation -Patient passed at 2309 after initiating comfort care    Best practice (evaluated daily)   Diet: NA Pain/Anxiety/Delirium protocol (if indicated): NA VAP protocol (if indicated): NA DVT prophylaxis: NA GI prophylaxis: NA Glucose control: NA Mobility: NA Family and staff present Wife at bedside and is decision maker Summary of discussion Patient to be made comfort care with compassionate extubation Code Status: DNR-CC   Labs   CBC: Recent Labs  Lab 03/11/2020 2007 03/26/2020 2009  WBC  --  11.3*  NEUTROABS  --  8.1*  HGB 15.3 15.4  HCT 45.0 46.3  MCV  --  97.3  PLT  --  449    Basic Metabolic Panel: Recent Labs  Lab 03/25/2020 2007 03/11/2020 2009  NA 138 138  K 3.1* 3.2*  CL 99 100  CO2  --  24  GLUCOSE 233* 243*  BUN 22 19  CREATININE 1.40* 1.44*  CALCIUM  --  8.6*   GFR: Estimated Creatinine Clearance: 53.9 mL/min (A) (by C-G formula based on SCr of 1.44 mg/dL (H)). Recent Labs  Lab 03/19/2020 2009  WBC 11.3*    Liver Function Tests: Recent Labs  Lab 04/01/2020 2009  AST 18  ALT 16  ALKPHOS 71  BILITOT 0.8  PROT 5.9*  ALBUMIN 3.7   No results for input(s): LIPASE, AMYLASE in the last 168 hours. No results for input(s): AMMONIA in the last 168 hours.  ABG    Component Value Date/Time   PHART 7.486 (H) 01/04/2019 1200   PCO2ART 38.0 01/04/2019 1200   PO2ART 53.6 (L) 01/04/2019 1200   HCO3 28.4 (H) 01/04/2019 1200   TCO2 25 04/01/2020 2007   O2SAT 88.9 01/04/2019 1200     Coagulation Profile: Recent Labs  Lab 04/01/2020 2009  INR 1.1    Cardiac Enzymes: No results for input(s): CKTOTAL, CKMB, CKMBINDEX, TROPONINI in the last 168 hours.  HbA1C: Hemoglobin A1C  Date/Time Value Ref Range Status  05/19/2019 10:01 AM 5.9 (A) 4.0 - 5.6 % Final   Hgb A1c MFr Bld  Date/Time Value Ref  Range Status  12/18/2019 06:44 AM 6.3 (H) 4.8 - 5.6 % Final    Comment:    (NOTE) Pre diabetes:          5.7%-6.4%  Diabetes:              >6.4%  Glycemic control for   <7.0% adults with diabetes   02/16/2019 10:37 AM 6.0 4.6 - 6.5 % Final    Comment:    Glycemic Control Guidelines for People with Diabetes:Non Diabetic:  <6%Goal of Therapy: <7%Additional Action Suggested:  >8%     CBG: No results for input(s): GLUCAP in the last 168 hours.  Review of Systems:   Unable to obtain.  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   Surgical History    Past Surgical History:  Procedure Laterality Date  . APPENDECTOMY  1967  . BACK SURGERY  2005  . CARDIOVERSION N/A 05/07/2014   Procedure: CARDIOVERSION;  Surgeon: [name], MD;  Location: [facility] ENDOSCOPY;  Service: Cardiovascular;  Laterality: N/A;  . CARDIOVERSION N/A 09/09/2014   Procedure: CARDIOVERSION;  Surgeon: Lelon Perla, MD;  Location: [facility] ENDOSCOPY;  Service: Cardiovascular;  Laterality: N/A;  successfully  . CATARACT EXTRACTION W/ INTRAOCULAR LENS  IMPLANT, BILATERAL  08 and 09/  2018  . COLONOSCOPY  last one 06-07-2011  . EYE SURGERY Bilateral 2018   cataract  .  GOLD SEED IMPLANT N/A 09/09/2017   Procedure: GOLD SEED IMPLANT;  Surgeon: [name], MD;  Location: [facility];  Service: Urology;  Laterality: N/A;  . HYDROCELE EXCISION Bilateral 09/26/2015   Procedure: HYDROCELECTOMY ADULT;  Surgeon: [name], MD;  Location: [facility];  Service: Urology;  Laterality: Bilateral;  . LAMINECTOMY AND MICRODISCECTOMY LUMBAR SPINE  12-23-2003   Left L5 -- S1  . LAPAROSCOPIC BILATERAL INGUINAL HERNIA REPAIR/  UMBILICAL HERNIA REPAIR WITH MESH/  ASPIRATION LEFT HYDROCELE  07-11-2012  . LEFT ATRIAL APPENDAGE OCCLUSION N/A 02/11/2020   Procedure: LEFT ATRIAL APPENDAGE OCCLUSION;  Surgeon: Vickie Epley, MD;  Location: [facility];  Service: Cardiovascular;  Laterality: N/A;  . LEFT ATRIAL APPENDAGE OCCLUSION    . NM MYOVIEW LTD  05/18/2014   Low risk study. Normal perfusion: No ischemia or infarction. Mild LV dysfunction - 46% (does not correlate with echocardiographic EF of 50-55%)  . PROSTATE BIOPSY  05/15/12   Clinically both Lobes  . SPACE OAR INSTILLATION N/A 09/09/2017   Procedure: SPACE OAR INSTILLATION;  Surgeon: [name], MD;  Location: [facility];  Service: Urology;  Laterality: N/A;  . TEE WITHOUT CARDIOVERSION N/A 05/07/2014   Procedure: TRANSESOPHAGEAL ECHOCARDIOGRAM (TEE);  Surgeon: [name], MD;  Location: [facility] ENDOSCOPY;  Service: Cardiovascular;  Laterality: N/A;   mild atherosclerosis plaque of aorta,  mild AR, MR, and TR,  no cardiac source of emboli  . TONSILLECTOMY  as child  . TRANSTHORACIC ECHOCARDIOGRAM  11/'18; 1/'19   a) In setting of sepsis: EF of 40-45%.  Diffuse hypokinesis.  No RWMA.  Biatrial enlargement.;; b) ** f/u Jan 2019: Normal LVF 55-60%** , mildly dilated aortic root(38 mm) and ascending aorta (44 mm). Compared to prior echo, LVEF has improved.  . TRANSTHORACIC ECHOCARDIOGRAM  7/'20; 9/'20   a) Hemorrhagic CVA/SAH - EF> 65%.  Moderate concentric  LVH.-GR 1 DD mild RA dilation.; b) EF 60 to 65%.  Mild LVH.  GRII DD.  Normal RV size and function.  Mild LA dilation.  Normal RA size.  Unable to assess PA pressures.  No significant aortic sclerosis.  . TRANSURETHRAL RESECTION OF PROSTATE N/A 05/30/2017   Procedure: TRANSURETHRAL RESECTION OF THE PROSTATE (TURP);  Surgeon: [name], MD;  Location: [facility];  Service: Urology;  Laterality: N/A;     Social History   reports that he has been smoking cigarettes.  He has a 60.00 pack-year smoking history. He has never used smokeless tobacco. He reports previous alcohol use. He reports that he does not use drugs.   Family History   His family history includes Breast cancer in his mother; Ovarian cancer in his sister; Stroke in his mother and another family member. There is no history of Colon cancer, Esophageal cancer, Rectal cancer, or Stomach cancer.   Allergies No Known Allergies   Home Medications  Prior to Admission medications   Medication Sig Start Date End Date Taking? Authorizing Provider  amiodarone (PACERONE) 200 MG tablet Take 1 tablet (200 mg total) by mouth 2 (two) times daily. 01/21/20   Vickie Epley, MD  apixaban (ELIQUIS) 5 MG TABS tablet Take 1 tablet (5 mg total) by mouth 2 (two) times daily. 01/21/20   Vickie Epley, MD  ascorbic acid (VITAMIN C) 500 MG tablet Take 500 mg by mouth daily.    [provider]  aspirin EC 81 MG EC tablet Take 1 tablet (81 mg total) by mouth daily. Swallow whole. 12/23/19   Donzetta Starch, NP  atorvastatin (LIPITOR) 10 MG tablet TAKE 1 TABLET BY MOUTH DAILY EACH EVENING 01/16/19   Burchette, Alinda Sierras, MD  Blood Glucose Monitoring Suppl (ACCU-CHEK AVIVA PLUS) w/Device KIT Use blood glucose machine as instructed on your strips 12/12/18   Angiulli, Lavon Paganini, PA-C  carvedilol (COREG) 6.25 MG tablet Take 1 tablet (6.25 mg total) by mouth 2 (two) times daily with a meal. 11/13/19   Burchette, Alinda Sierras, MD  Cholecalciferol (VITAMIN D-3)  25 MCG (1000 UT) CAPS Take 1,000 Units by mouth daily.     [provider]  colchicine 0.6 MG tablet TAKE 1 TABLET (0.6 MG TOTAL) BY MOUTH 2 (TWO) TIMES DAILY AS NEEDED (GOUT FLARE). 02/22/20   Burchette, Alinda Sierras, MD  divalproex (DEPAKOTE) 500 MG DR tablet TAKE 1 TABLET BY MOUTH EVERY 12 HOURS 11/30/19   Dohmeier, Asencion Partridge, MD  furosemide (LASIX) 40 MG tablet TAKE 1 TABLET (40 MG TOTAL) BY MOUTH 2 (TWO) TIMES DAILY WITH BREAKFAST AND LUNCH. 02/22/20   Burchette, Alinda Sierras, MD  glucose blood (ACCU-CHEK AVIVA PLUS) test strip Test 2 times daily dx e11.9 12/12/18   Angiulli, Lavon Paganini, PA-C  Lancets (ACCU-CHEK SOFT TOUCH) lancets Check blood sugars once per day. DX: E11.9 12/12/18   Angiulli, Lavon Paganini, PA-C  losartan (COZAAR) 100 MG tablet Take 1 tablet (100 mg total) by mouth daily. 01/21/20 04/20/20  Vickie Epley, MD  metFORMIN (GLUCOPHAGE) 500 MG tablet Take 500 mg by mouth every evening.    [provider]  montelukast (SINGULAIR) 10 MG tablet TAKE 1 TABLET BY MOUTH EVERYDAY AT BEDTIME 01/01/20   Burchette, Alinda Sierras, MD  MYRBETRIQ 25 MG TB24 tablet Take 25 mg by mouth daily. 05/11/19   [provider]  PRESCRIPTION MEDICATION CPAP- At bedtime    [provider]     Critical care time: 55 minutes

## 2020-04-09 DEATH — deceased

## 2020-04-12 ENCOUNTER — Ambulatory Visit: Payer: Medicare Other | Admitting: Cardiology

## 2020-05-17 ENCOUNTER — Ambulatory Visit: Payer: Medicare Other | Admitting: Family Medicine

## 2020-08-31 ENCOUNTER — Ambulatory Visit: Payer: Federal, State, Local not specified - PPO | Admitting: Neurology

## 2020-11-04 IMAGING — DX DG CHEST 1V PORT
1 series · 1 of 1 positions shown · non-contrast
Comparison: Chest radiograph 07/26/2017. There is a lesion chest CT
03/17/2018.

CLINICAL DATA: Altered mental status.

EXAM:
PORTABLE CHEST 1 VIEW

[chest]
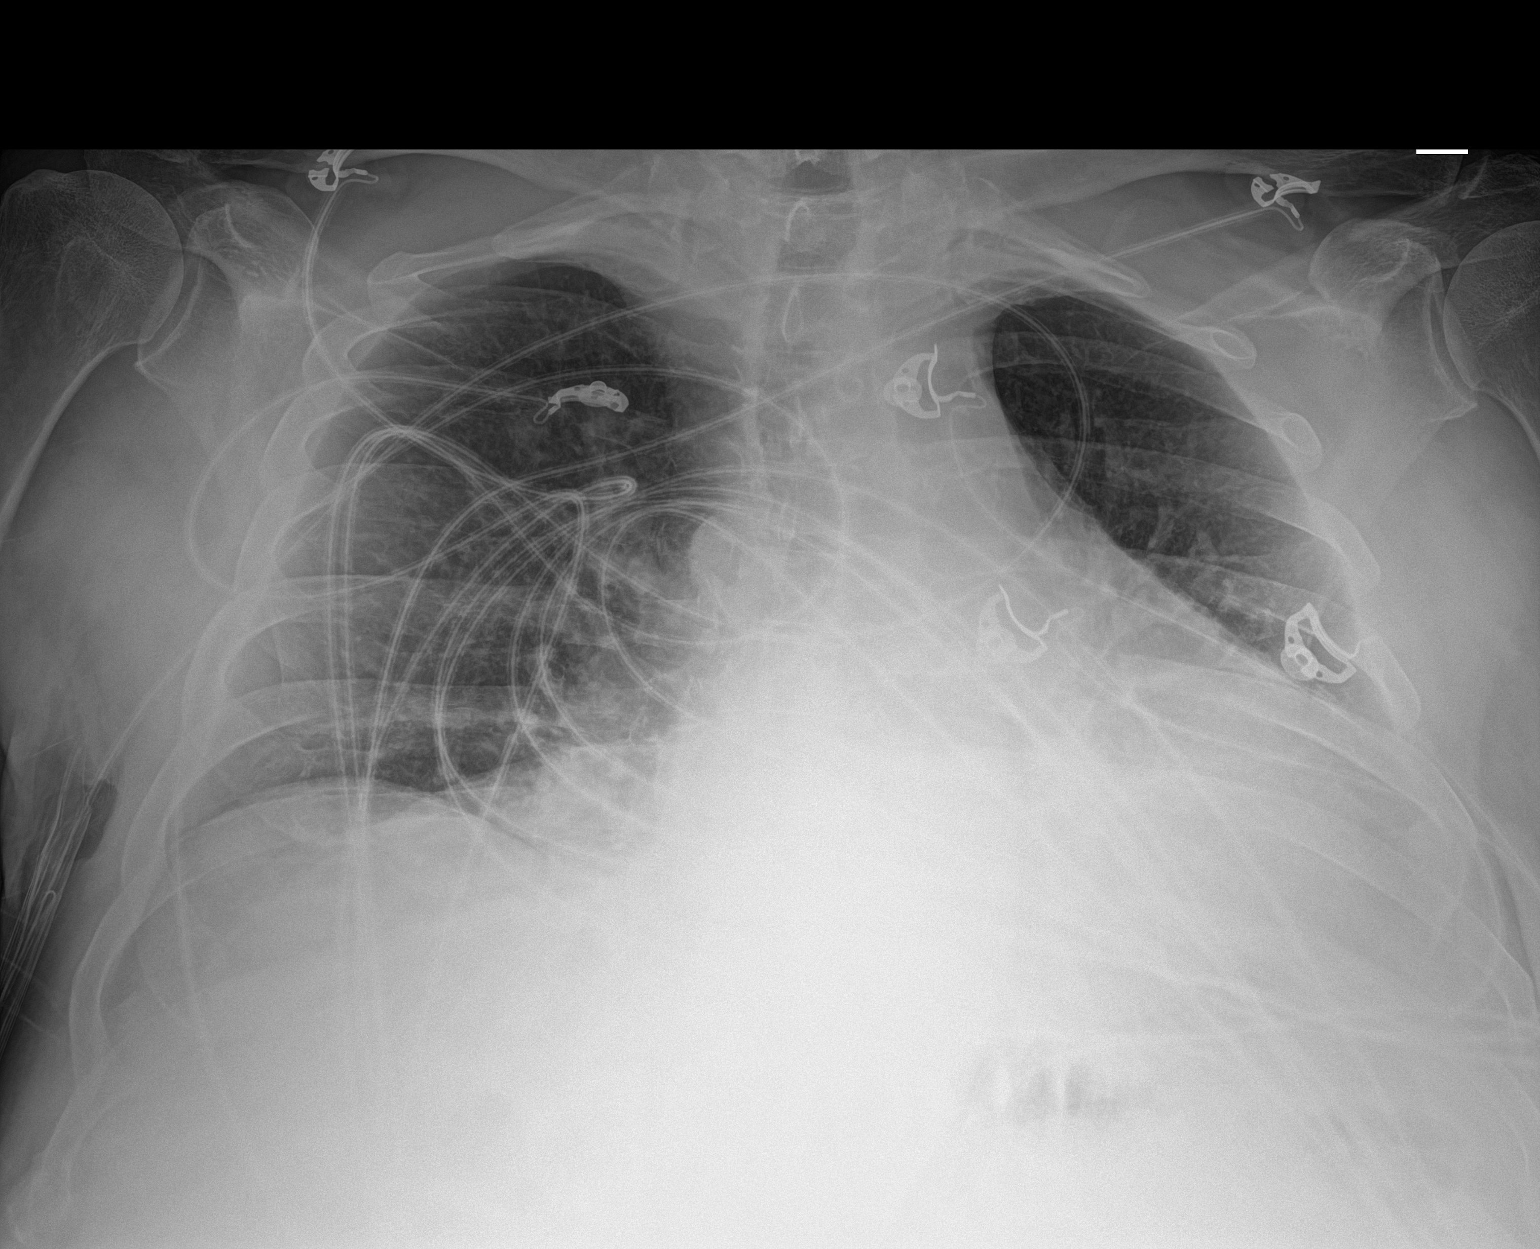

[1 of 1 positions shown; findings below may reference images not displayed]

FINDINGS: Low lung volumes. Cardiomegaly. No obvious pulmonary edema. Left
basilar evaluation is limited due to soft tissue attenuation. No
evidence of acute airspace disease or pneumothorax.
IMPRESSION: 1. Low lung volumes limit assessment, particularly at the left lung
base.
2. Cardiomegaly.

## 2020-11-04 IMAGING — CT CT ANGIO CHEST
2 of 7 series · 13 of 46 positions shown · IV contrast (APPLIED)
Comparison: CT chest 01/31/2015
COMPARISON: CT chest 01/31/2015

Addendum:
CLINICAL DATA: Altered mental status, starting mid day, abdominal
pain and distension, PE suspected, positive D-dimer

EXAM:
CT ANGIOGRAPHY CHEST WITH CONTRAST
TECHNIQUE: Multidetector CT imaging of the chest was performed using the
standard protocol during bolus administration of intravenous
contrast. Multiplanar CT image reconstructions and MIPs were
obtained to evaluate the vascular anatomy.
CONTRAST:  55mL OMNIPAQUE IOHEXOL 350 MG/ML SOLN

[Series 7: thins · axial · 0.76mm/px · z∈[+1237,+1456]mm · 10 of 352 slices shown]
[im 20/352  lung]
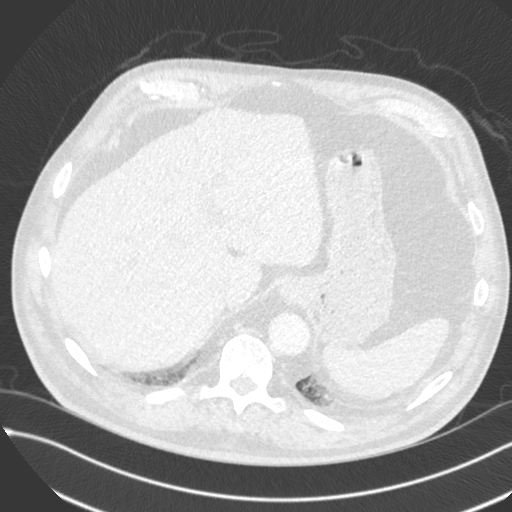
[im 59/352  soft-tissue]
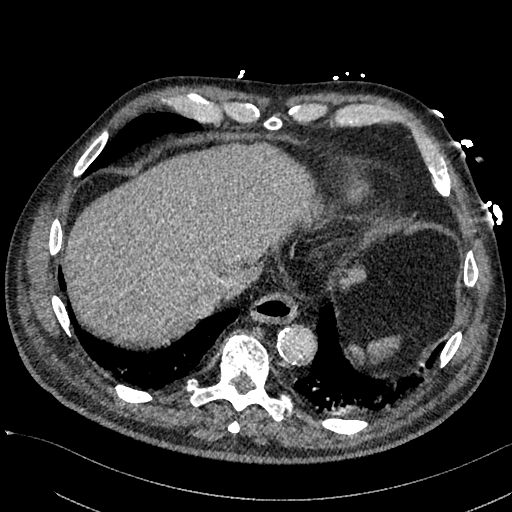
[im 98/352  lung]
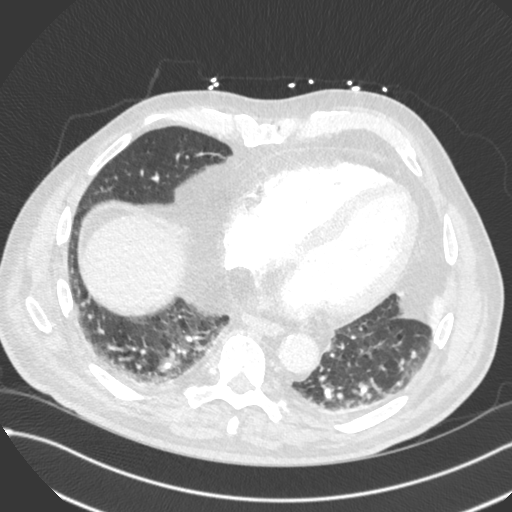
[im 118/352  soft-tissue]
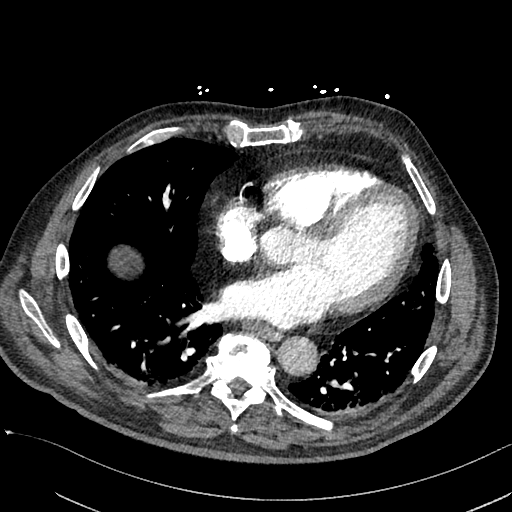
[im 157/352  lung]
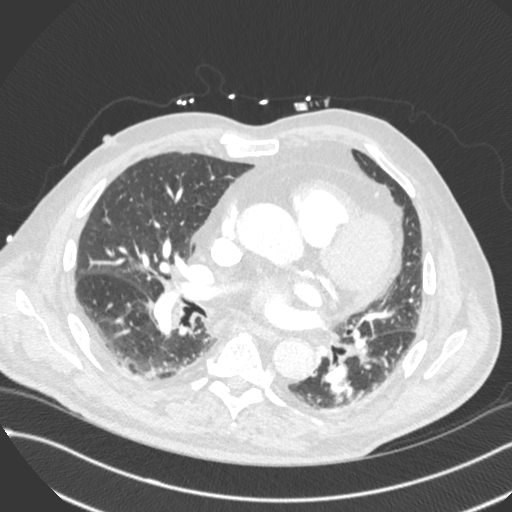
[im 196/352  soft-tissue]
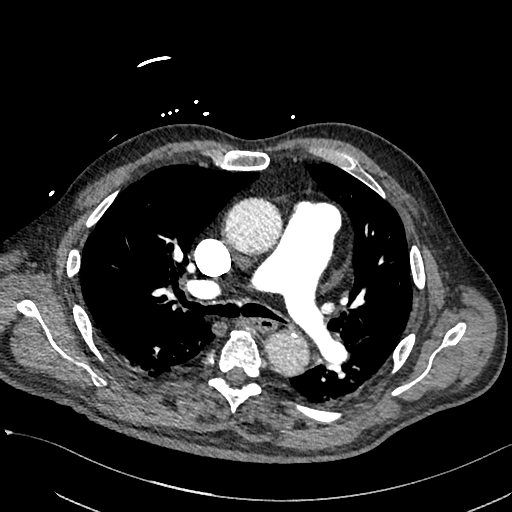
[im 235/352  lung]
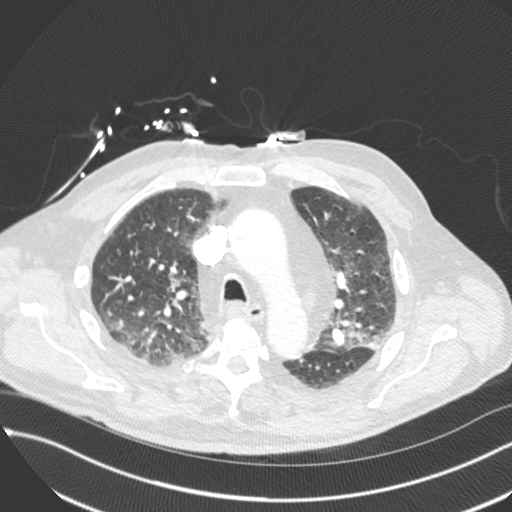
[im 254/352  soft-tissue]
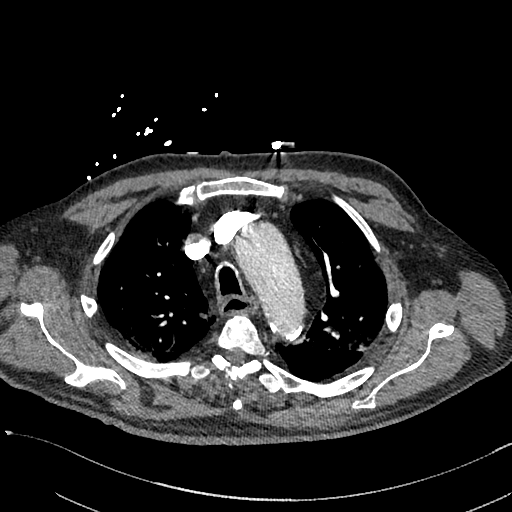
[im 293/352  lung]
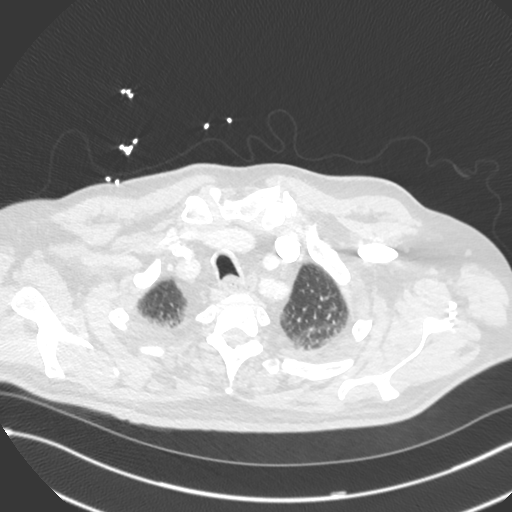
[im 332/352  soft-tissue]
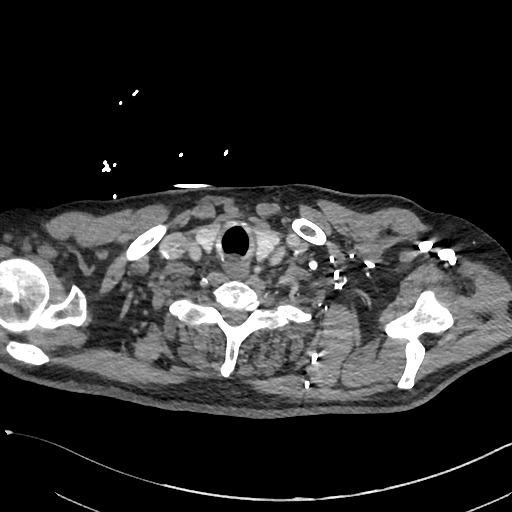

[Series 8: cor · coronal · 0.53mm/px · 3 of 154 slices shown]
[im 39/154  soft-tissue]
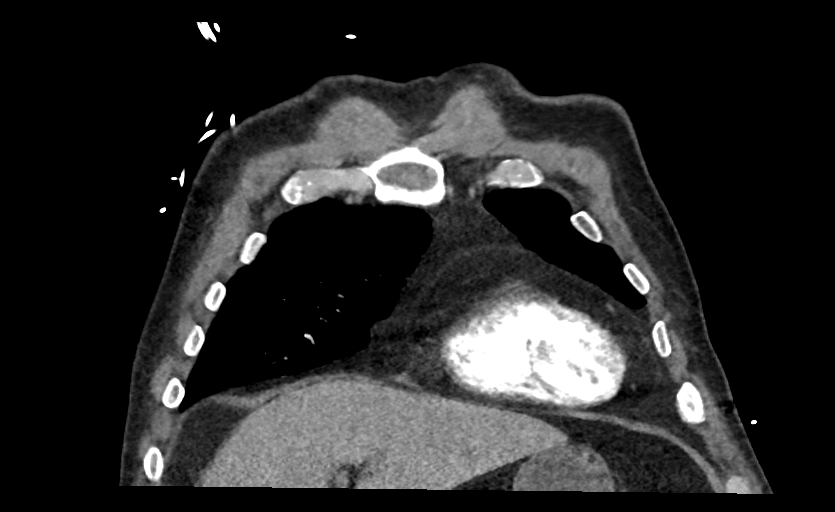
[im 77/154  soft-tissue]
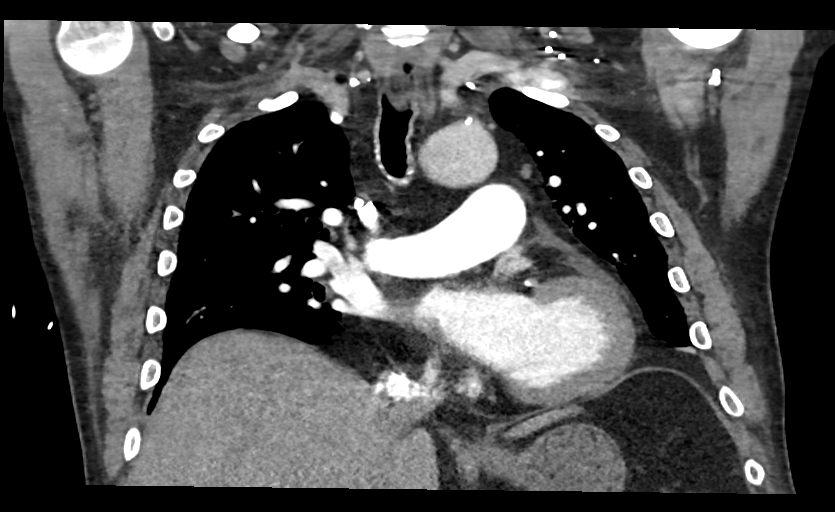
[im 115/154  soft-tissue]
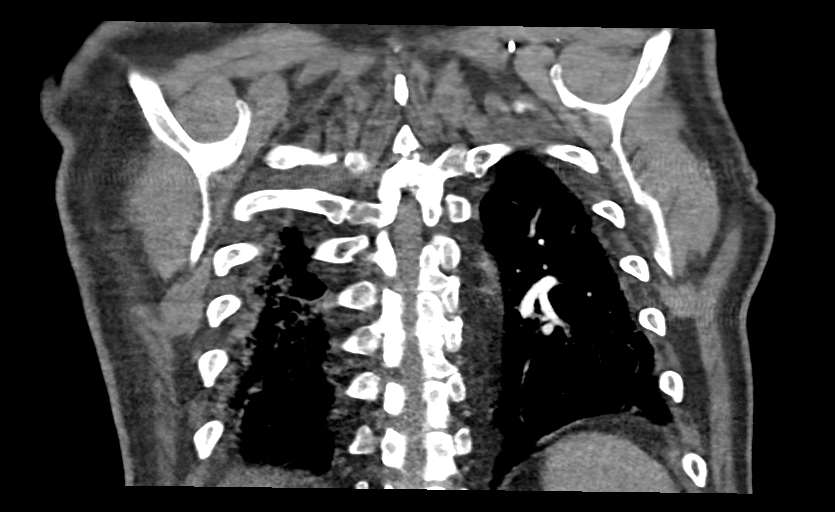

[13 of 46 positions shown; findings below may reference images not displayed]

FINDINGS: Cardiovascular: Satisfactory opacification of pulmonary arteries to
the segmental level. Filling defects are present in the pulmonary
arteries of the superior segment and posterior basal segment of the
right lower lobe. No other visible pulmonary artery filling defects
are evident. There is central pulmonary arterial enlargement,
increased from comparison study. There is borderline elevation of
the RV/LV ratio (0.95) with flattening of the intraventricular
septum. Borderline cardiomegaly. No pericardial effusion. Ascending
aorta is dilated to 4.1 cm. There is atherosclerotic calcification
of the aortic arch and proximal great vessels. Suboptimal contrast
opacification of the aorta limits luminal evaluation.

Mediastinum/Nodes: No enlarged mediastinal or axillary lymph nodes.
Thyroid gland, trachea, and esophagus demonstrate no significant
findings.

Lungs/Pleura: Lung volumes are diminished with extensive areas of
atelectasis throughout both lungs. More subpleural reticulation is
noted in the upper lobes with some peribronchial thickening and
bronchiectatic changes throughout both lungs. Subpleural cyst noted
in the right lower lung. A previously seen 5 mm nodule in the left
lower lobe is poorly visualized on today's exam due to dependent
atelectasis in the lung base. Lobular 12 mm nodule in the lingula
associated with a bandlike area of scarring may reflect rounded
atelectasis, unchanged from prior.

Upper Abdomen: No acute abnormalities present in the visualized
portions of the upper abdomen. Abdominal aortic atherosclerosis is
evident.

Musculoskeletal: No acute osseous abnormality or suspicious osseous
lesion. Multilevel degenerative changes are present in the imaged
portions of the spine.

Review of the MIP images confirms the above findings.
IMPRESSION: 1. Positive for acute PE with CT evidence of right heart strain
(RV/LV Ratio = 0.95, flattening of the intraventricular septum,
central pulmonary artery enlargement) consistent with at least
submassive (intermediate risk) PE. The presence of right heart
strain has been associated with an increased risk of morbidity and
mortality. Please activate Code PE by paging [phone].
2. Extensive areas of atelectasis likely accentuated by imaging
during exhalation.
3. Suspect a region of rounded atelectasis or scarring within the
lingula, stable from comparison exam in 5618.
4.  Aortic Atherosclerosis (HT527-CVB.B).  Coronary atherosclerosis.

Critical Value/emergent results were called by telephone at the time
who verbally acknowledged these results.

ADDENDUM:
Dilation of the ascending thoracic aorta to 4.1 cm. Recommend annual
imaging followup by CTA or MRA. This recommendation follows 8252
ACCF/AHA/AATS/ACR/ASA/SCA/EUGNIO/IVELIN/CROWELL/LASTIMOSA Guidelines for the
Diagnosis and Management of Patients with Thoracic Aortic Disease.
Circulation. 8252; 121: E266-e369. Aortic aneurysm NOS (HT527-ZIP.A)

*** End of Addendum ***
FINDINGS: Cardiovascular: Satisfactory opacification of pulmonary arteries to
the segmental level. Filling defects are present in the pulmonary
arteries of the superior segment and posterior basal segment of the
right lower lobe. No other visible pulmonary artery filling defects
are evident. There is central pulmonary arterial enlargement,
increased from comparison study. There is borderline elevation of
the RV/LV ratio (0.95) with flattening of the intraventricular
septum. Borderline cardiomegaly. No pericardial effusion. Ascending
aorta is dilated to 4.1 cm. There is atherosclerotic calcification
of the aortic arch and proximal great vessels. Suboptimal contrast
opacification of the aorta limits luminal evaluation.

Mediastinum/Nodes: No enlarged mediastinal or axillary lymph nodes.
Thyroid gland, trachea, and esophagus demonstrate no significant
findings.

Lungs/Pleura: Lung volumes are diminished with extensive areas of
atelectasis throughout both lungs. More subpleural reticulation is
noted in the upper lobes with some peribronchial thickening and
bronchiectatic changes throughout both lungs. Subpleural cyst noted
in the right lower lung. A previously seen 5 mm nodule in the left
lower lobe is poorly visualized on today's exam due to dependent
atelectasis in the lung base. Lobular 12 mm nodule in the lingula
associated with a bandlike area of scarring may reflect rounded
atelectasis, unchanged from prior.

Upper Abdomen: No acute abnormalities present in the visualized
portions of the upper abdomen. Abdominal aortic atherosclerosis is
evident.

Musculoskeletal: No acute osseous abnormality or suspicious osseous
lesion. Multilevel degenerative changes are present in the imaged
portions of the spine.

Review of the MIP images confirms the above findings.
IMPRESSION: 1. Positive for acute PE with CT evidence of right heart strain
(RV/LV Ratio = 0.95, flattening of the intraventricular septum,
central pulmonary artery enlargement) consistent with at least
submassive (intermediate risk) PE. The presence of right heart
strain has been associated with an increased risk of morbidity and
mortality. Please activate Code PE by paging [phone].
2. Extensive areas of atelectasis likely accentuated by imaging
during exhalation.
3. Suspect a region of rounded atelectasis or scarring within the
lingula, stable from comparison exam in 5618.
4.  Aortic Atherosclerosis (HT527-CVB.B).  Coronary atherosclerosis.

Critical Value/emergent results were called by telephone at the time
who verbally acknowledged these results.
